# Patient Record
Sex: Male | Born: 1935 | Race: Black or African American | Hispanic: No | Marital: Married | Smoking: Former smoker
Health system: Southern US, Community
[De-identification: ages and names within clinical notes are randomized; demographics above are authoritative.]

## PROBLEM LIST (undated history)

## (undated) DIAGNOSIS — I2510 Atherosclerotic heart disease of native coronary artery without angina pectoris: Secondary | ICD-10-CM

## (undated) DIAGNOSIS — I428 Other cardiomyopathies: Secondary | ICD-10-CM

## (undated) DIAGNOSIS — Z992 Dependence on renal dialysis: Secondary | ICD-10-CM

## (undated) DIAGNOSIS — Z8673 Personal history of transient ischemic attack (TIA), and cerebral infarction without residual deficits: Secondary | ICD-10-CM

## (undated) DIAGNOSIS — D638 Anemia in other chronic diseases classified elsewhere: Secondary | ICD-10-CM

## (undated) DIAGNOSIS — N186 End stage renal disease: Secondary | ICD-10-CM

## (undated) DIAGNOSIS — M1990 Unspecified osteoarthritis, unspecified site: Secondary | ICD-10-CM

## (undated) DIAGNOSIS — J189 Pneumonia, unspecified organism: Secondary | ICD-10-CM

## (undated) DIAGNOSIS — E7800 Pure hypercholesterolemia, unspecified: Secondary | ICD-10-CM

## (undated) DIAGNOSIS — Z9289 Personal history of other medical treatment: Secondary | ICD-10-CM

## (undated) DIAGNOSIS — I10 Essential (primary) hypertension: Secondary | ICD-10-CM

## (undated) DIAGNOSIS — M109 Gout, unspecified: Secondary | ICD-10-CM

## (undated) DIAGNOSIS — I214 Non-ST elevation (NSTEMI) myocardial infarction: Secondary | ICD-10-CM

## (undated) DIAGNOSIS — I739 Peripheral vascular disease, unspecified: Secondary | ICD-10-CM

## (undated) DIAGNOSIS — Z8701 Personal history of pneumonia (recurrent): Secondary | ICD-10-CM

## (undated) HISTORY — PX: BACK SURGERY: SHX140

## (undated) HISTORY — PX: CARDIAC CATHETERIZATION: SHX172

## (undated) HISTORY — DX: Anemia in other chronic diseases classified elsewhere: D63.8

## (undated) HISTORY — PX: HEMORRHOID SURGERY: SHX153

## (undated) HISTORY — PX: COLONOSCOPY: SHX174

## (undated) HISTORY — PX: CHOLECYSTECTOMY: SHX55

## (undated) HISTORY — PX: INSERTION OF DIALYSIS CATHETER: SHX1324

---

## 2002-02-17 ENCOUNTER — Ambulatory Visit (HOSPITAL_COMMUNITY): Admission: RE | Admit: 2002-02-17 | Discharge: 2002-02-17 | Payer: Self-pay | Admitting: General Surgery

## 2002-03-18 ENCOUNTER — Encounter: Payer: Self-pay | Admitting: General Surgery

## 2002-03-18 ENCOUNTER — Ambulatory Visit (HOSPITAL_COMMUNITY): Admission: RE | Admit: 2002-03-18 | Discharge: 2002-03-18 | Payer: Self-pay | Admitting: General Surgery

## 2002-04-07 ENCOUNTER — Encounter: Payer: Self-pay | Admitting: Neurosurgery

## 2002-04-07 ENCOUNTER — Encounter: Admission: RE | Admit: 2002-04-07 | Discharge: 2002-04-07 | Payer: Self-pay | Admitting: Neurosurgery

## 2002-04-21 ENCOUNTER — Encounter: Admission: RE | Admit: 2002-04-21 | Discharge: 2002-04-21 | Payer: Self-pay | Admitting: Neurosurgery

## 2002-04-21 ENCOUNTER — Encounter: Payer: Self-pay | Admitting: Neurosurgery

## 2002-05-05 ENCOUNTER — Encounter: Payer: Self-pay | Admitting: Neurosurgery

## 2002-05-05 ENCOUNTER — Encounter: Admission: RE | Admit: 2002-05-05 | Discharge: 2002-05-05 | Payer: Self-pay | Admitting: Neurosurgery

## 2002-05-18 ENCOUNTER — Encounter: Payer: Self-pay | Admitting: Neurosurgery

## 2002-05-19 ENCOUNTER — Encounter: Payer: Self-pay | Admitting: Neurosurgery

## 2002-05-19 ENCOUNTER — Inpatient Hospital Stay (HOSPITAL_COMMUNITY): Admission: RE | Admit: 2002-05-19 | Discharge: 2002-05-22 | Payer: Self-pay | Admitting: Neurosurgery

## 2002-09-23 HISTORY — PX: LUMBAR DISC SURGERY: SHX700

## 2003-03-30 ENCOUNTER — Ambulatory Visit: Admission: RE | Admit: 2003-03-30 | Discharge: 2003-03-30 | Payer: Self-pay | Admitting: Orthopedic Surgery

## 2003-03-30 ENCOUNTER — Encounter: Payer: Self-pay | Admitting: Orthopedic Surgery

## 2003-04-01 ENCOUNTER — Ambulatory Visit (HOSPITAL_COMMUNITY): Admission: RE | Admit: 2003-04-01 | Discharge: 2003-04-01 | Payer: Self-pay | Admitting: Orthopedic Surgery

## 2003-04-01 ENCOUNTER — Encounter: Payer: Self-pay | Admitting: Orthopedic Surgery

## 2004-08-07 ENCOUNTER — Emergency Department (HOSPITAL_COMMUNITY): Admission: EM | Admit: 2004-08-07 | Discharge: 2004-08-07 | Payer: Self-pay | Admitting: Emergency Medicine

## 2004-08-13 ENCOUNTER — Ambulatory Visit: Payer: Self-pay | Admitting: Orthopedic Surgery

## 2004-09-05 ENCOUNTER — Ambulatory Visit: Payer: Self-pay | Admitting: Orthopedic Surgery

## 2005-01-10 ENCOUNTER — Ambulatory Visit (HOSPITAL_COMMUNITY): Admission: RE | Admit: 2005-01-10 | Discharge: 2005-01-10 | Payer: Self-pay | Admitting: Family Medicine

## 2005-01-31 ENCOUNTER — Ambulatory Visit (HOSPITAL_COMMUNITY): Admission: RE | Admit: 2005-01-31 | Discharge: 2005-01-31 | Payer: Self-pay | Admitting: Nephrology

## 2005-03-30 ENCOUNTER — Emergency Department (HOSPITAL_COMMUNITY): Admission: EM | Admit: 2005-03-30 | Discharge: 2005-03-30 | Payer: Self-pay | Admitting: Emergency Medicine

## 2005-06-23 ENCOUNTER — Encounter (INDEPENDENT_AMBULATORY_CARE_PROVIDER_SITE_OTHER): Payer: Self-pay | Admitting: Internal Medicine

## 2005-06-23 LAB — CONVERTED CEMR LAB: Blood Glucose, Fasting: 120 mg/dL

## 2005-08-28 ENCOUNTER — Ambulatory Visit (HOSPITAL_COMMUNITY): Admission: RE | Admit: 2005-08-28 | Discharge: 2005-08-28 | Payer: Self-pay | Admitting: Nephrology

## 2005-09-23 HISTORY — PX: KNEE ARTHROSCOPY: SHX127

## 2005-12-17 ENCOUNTER — Ambulatory Visit (HOSPITAL_COMMUNITY): Admission: RE | Admit: 2005-12-17 | Discharge: 2005-12-17 | Payer: Self-pay | Admitting: General Surgery

## 2006-02-21 ENCOUNTER — Emergency Department (HOSPITAL_COMMUNITY): Admission: EM | Admit: 2006-02-21 | Discharge: 2006-02-21 | Payer: Self-pay | Admitting: Emergency Medicine

## 2006-02-24 ENCOUNTER — Ambulatory Visit: Payer: Self-pay | Admitting: Internal Medicine

## 2006-02-26 ENCOUNTER — Ambulatory Visit (HOSPITAL_COMMUNITY): Admission: RE | Admit: 2006-02-26 | Discharge: 2006-02-26 | Payer: Self-pay | Admitting: Internal Medicine

## 2006-03-10 ENCOUNTER — Ambulatory Visit: Payer: Self-pay | Admitting: Internal Medicine

## 2006-03-17 ENCOUNTER — Ambulatory Visit: Payer: Self-pay | Admitting: Internal Medicine

## 2006-06-30 ENCOUNTER — Ambulatory Visit: Payer: Self-pay | Admitting: Internal Medicine

## 2006-06-30 LAB — CONVERTED CEMR LAB
RBC count: 4.17 10*6/uL
WBC, blood: 6.1 10*3/uL

## 2006-07-21 ENCOUNTER — Ambulatory Visit: Payer: Self-pay | Admitting: Orthopedic Surgery

## 2006-07-22 ENCOUNTER — Ambulatory Visit: Payer: Self-pay | Admitting: Orthopedic Surgery

## 2006-07-22 ENCOUNTER — Ambulatory Visit (HOSPITAL_COMMUNITY): Admission: RE | Admit: 2006-07-22 | Discharge: 2006-07-22 | Payer: Self-pay | Admitting: Orthopedic Surgery

## 2006-07-24 ENCOUNTER — Encounter (HOSPITAL_COMMUNITY): Admission: RE | Admit: 2006-07-24 | Discharge: 2006-08-23 | Payer: Self-pay | Admitting: Orthopedic Surgery

## 2006-07-28 ENCOUNTER — Ambulatory Visit: Payer: Self-pay | Admitting: Orthopedic Surgery

## 2006-08-04 ENCOUNTER — Ambulatory Visit: Payer: Self-pay | Admitting: Orthopedic Surgery

## 2006-08-18 ENCOUNTER — Encounter: Payer: Self-pay | Admitting: Internal Medicine

## 2006-08-18 ENCOUNTER — Ambulatory Visit: Payer: Self-pay | Admitting: Orthopedic Surgery

## 2006-08-18 DIAGNOSIS — E278 Other specified disorders of adrenal gland: Secondary | ICD-10-CM | POA: Insufficient documentation

## 2006-08-18 DIAGNOSIS — I119 Hypertensive heart disease without heart failure: Secondary | ICD-10-CM

## 2006-08-18 DIAGNOSIS — Q613 Polycystic kidney, unspecified: Secondary | ICD-10-CM

## 2006-08-18 DIAGNOSIS — N184 Chronic kidney disease, stage 4 (severe): Secondary | ICD-10-CM

## 2006-08-18 DIAGNOSIS — M1990 Unspecified osteoarthritis, unspecified site: Secondary | ICD-10-CM | POA: Insufficient documentation

## 2006-08-26 ENCOUNTER — Emergency Department (HOSPITAL_COMMUNITY): Admission: EM | Admit: 2006-08-26 | Discharge: 2006-08-26 | Payer: Self-pay | Admitting: Emergency Medicine

## 2006-09-08 ENCOUNTER — Ambulatory Visit: Payer: Self-pay | Admitting: Orthopedic Surgery

## 2006-10-13 ENCOUNTER — Ambulatory Visit: Payer: Self-pay | Admitting: Orthopedic Surgery

## 2006-12-10 ENCOUNTER — Ambulatory Visit: Payer: Self-pay | Admitting: Family Medicine

## 2006-12-10 DIAGNOSIS — E785 Hyperlipidemia, unspecified: Secondary | ICD-10-CM | POA: Insufficient documentation

## 2006-12-22 ENCOUNTER — Encounter (INDEPENDENT_AMBULATORY_CARE_PROVIDER_SITE_OTHER): Payer: Self-pay | Admitting: Internal Medicine

## 2006-12-22 ENCOUNTER — Encounter (INDEPENDENT_AMBULATORY_CARE_PROVIDER_SITE_OTHER): Payer: Self-pay | Admitting: Family Medicine

## 2006-12-24 ENCOUNTER — Ambulatory Visit: Payer: Self-pay | Admitting: Internal Medicine

## 2006-12-24 DIAGNOSIS — I739 Peripheral vascular disease, unspecified: Secondary | ICD-10-CM

## 2006-12-30 LAB — CONVERTED CEMR LAB
Alkaline Phosphatase: 65 units/L (ref 39–117)
BUN: 30 mg/dL — ABNORMAL HIGH (ref 6–23)
Creatinine, Ser: 1.98 mg/dL — ABNORMAL HIGH (ref 0.40–1.50)
Eosinophils Absolute: 0.2 10*3/uL (ref 0.0–0.7)
Eosinophils Relative: 3 % (ref 0–5)
Glucose, Bld: 126 mg/dL — ABNORMAL HIGH (ref 70–99)
HCT: 38.4 % — ABNORMAL LOW (ref 39.0–52.0)
HDL: 52 mg/dL (ref >39)
LDL Cholesterol: 198 mg/dL — ABNORMAL HIGH (ref 0–99)
Lymphs Abs: 1.3 10*3/uL (ref 0.7–3.3)
MCV: 89.5 fL (ref 78.0–100.0)
Monocytes Absolute: 0.7 10*3/uL (ref 0.2–0.7)
Monocytes Relative: 11 % (ref 3–11)
Platelets: 283 10*3/uL (ref 150–400)
Sodium: 142 meq/L (ref 135–145)
Total Bilirubin: 0.6 mg/dL (ref 0.3–1.2)
Total CHOL/HDL Ratio: 5.4
Triglycerides: 158 mg/dL — ABNORMAL HIGH (ref <150)
VLDL: 32 mg/dL (ref 0–40)
WBC: 5.7 10*3/uL (ref 4.0–10.5)

## 2007-01-21 ENCOUNTER — Ambulatory Visit: Payer: Self-pay | Admitting: Internal Medicine

## 2007-05-29 ENCOUNTER — Ambulatory Visit: Payer: Self-pay | Admitting: Internal Medicine

## 2007-06-24 ENCOUNTER — Ambulatory Visit: Payer: Self-pay | Admitting: Internal Medicine

## 2007-06-24 DIAGNOSIS — M79609 Pain in unspecified limb: Secondary | ICD-10-CM

## 2007-06-25 LAB — CONVERTED CEMR LAB
CO2: 25 meq/L (ref 19–32)
Calcium: 9.6 mg/dL (ref 8.4–10.5)
Cholesterol: 175 mg/dL (ref 0–200)
Creatinine, Ser: 2.04 mg/dL — ABNORMAL HIGH (ref 0.40–1.50)
Glucose, Bld: 122 mg/dL — ABNORMAL HIGH (ref 70–99)
Total Bilirubin: 0.5 mg/dL (ref 0.3–1.2)
Total CHOL/HDL Ratio: 2.9
Total Protein: 6.7 g/dL (ref 6.0–8.3)
Triglycerides: 103 mg/dL (ref <150)
VLDL: 21 mg/dL (ref 0–40)

## 2007-07-14 ENCOUNTER — Telehealth (INDEPENDENT_AMBULATORY_CARE_PROVIDER_SITE_OTHER): Payer: Self-pay | Admitting: *Deleted

## 2008-01-27 ENCOUNTER — Ambulatory Visit: Payer: Self-pay | Admitting: Internal Medicine

## 2008-01-27 DIAGNOSIS — G2581 Restless legs syndrome: Secondary | ICD-10-CM

## 2008-01-28 ENCOUNTER — Telehealth (INDEPENDENT_AMBULATORY_CARE_PROVIDER_SITE_OTHER): Payer: Self-pay | Admitting: *Deleted

## 2008-01-28 LAB — CONVERTED CEMR LAB
ALT: 9 U/L
AST: 14 U/L
Albumin: 4.2 g/dL
Alkaline Phosphatase: 64 U/L
BUN: 28 mg/dL — ABNORMAL HIGH
CO2: 25 meq/L
Calcium: 9.1 mg/dL
Chloride: 107 meq/L
Cholesterol: 260 mg/dL — ABNORMAL HIGH
Creatinine, Ser: 1.98 mg/dL — ABNORMAL HIGH
Glucose, Bld: 103 mg/dL — ABNORMAL HIGH
HDL: 50 mg/dL
LDL Cholesterol: 170 mg/dL — ABNORMAL HIGH
Potassium: 4.2 meq/L
Sodium: 142 meq/L
Total Bilirubin: 0.7 mg/dL
Total CHOL/HDL Ratio: 5.2
Total Protein: 6.6 g/dL
Triglycerides: 198 mg/dL — ABNORMAL HIGH
VLDL: 40 mg/dL

## 2008-06-03 ENCOUNTER — Ambulatory Visit: Payer: Self-pay | Admitting: Internal Medicine

## 2008-06-03 DIAGNOSIS — R21 Rash and other nonspecific skin eruption: Secondary | ICD-10-CM | POA: Insufficient documentation

## 2008-06-03 DIAGNOSIS — M545 Low back pain: Secondary | ICD-10-CM

## 2008-06-03 LAB — CONVERTED CEMR LAB
Bilirubin Urine: NEGATIVE
Ketones, urine, test strip: NEGATIVE
Nitrite: NEGATIVE
Protein, U semiquant: 100
Urobilinogen, UA: NEGATIVE
pH: 6

## 2008-06-30 ENCOUNTER — Ambulatory Visit: Payer: Self-pay | Admitting: Internal Medicine

## 2008-07-01 ENCOUNTER — Encounter (INDEPENDENT_AMBULATORY_CARE_PROVIDER_SITE_OTHER): Payer: Self-pay | Admitting: Internal Medicine

## 2008-07-04 LAB — CONVERTED CEMR LAB
Albumin: 4.3 g/dL (ref 3.5–5.2)
BUN: 39 mg/dL — ABNORMAL HIGH (ref 6–23)
CO2: 24 meq/L (ref 19–32)
Calcium: 9.1 mg/dL (ref 8.4–10.5)
Chloride: 107 meq/L (ref 96–112)
Cholesterol: 160 mg/dL (ref 0–200)
Creatinine, Ser: 1.98 mg/dL — ABNORMAL HIGH (ref 0.40–1.50)
Glucose, Bld: 125 mg/dL — ABNORMAL HIGH (ref 70–99)
HDL: 60 mg/dL (ref >39)
Potassium: 4.7 meq/L (ref 3.5–5.3)
Total CHOL/HDL Ratio: 2.7
Triglycerides: 70 mg/dL (ref <150)

## 2008-08-09 ENCOUNTER — Telehealth (INDEPENDENT_AMBULATORY_CARE_PROVIDER_SITE_OTHER): Payer: Self-pay | Admitting: Internal Medicine

## 2008-08-10 ENCOUNTER — Ambulatory Visit: Payer: Self-pay | Admitting: Internal Medicine

## 2008-08-10 DIAGNOSIS — M109 Gout, unspecified: Secondary | ICD-10-CM

## 2008-08-11 LAB — CONVERTED CEMR LAB

## 2008-08-17 ENCOUNTER — Telehealth (INDEPENDENT_AMBULATORY_CARE_PROVIDER_SITE_OTHER): Payer: Self-pay | Admitting: Internal Medicine

## 2008-10-26 ENCOUNTER — Encounter (INDEPENDENT_AMBULATORY_CARE_PROVIDER_SITE_OTHER): Payer: Self-pay | Admitting: Internal Medicine

## 2008-11-01 ENCOUNTER — Ambulatory Visit: Payer: Self-pay | Admitting: Internal Medicine

## 2008-11-02 ENCOUNTER — Encounter (INDEPENDENT_AMBULATORY_CARE_PROVIDER_SITE_OTHER): Payer: Self-pay | Admitting: Internal Medicine

## 2008-11-02 LAB — CONVERTED CEMR LAB
Alkaline Phosphatase: 63 units/L (ref 39–117)
Basophils Absolute: 0 10*3/uL (ref 0.0–0.1)
CO2: 23 meq/L (ref 19–32)
Cholesterol: 164 mg/dL (ref 0–200)
Creatinine, Ser: 1.84 mg/dL — ABNORMAL HIGH (ref 0.40–1.50)
Eosinophils Absolute: 0.2 10*3/uL (ref 0.0–0.7)
Eosinophils Relative: 3 % (ref 0–5)
Glucose, Bld: 113 mg/dL — ABNORMAL HIGH (ref 70–99)
HCT: 34.2 % — ABNORMAL LOW (ref 39.0–52.0)
Hemoglobin: 11 g/dL — ABNORMAL LOW (ref 13.0–17.0)
LDL Cholesterol: 90 mg/dL (ref 0–99)
Lymphocytes Relative: 25 % (ref 12–46)
MCV: 87.2 fL (ref 78.0–100.0)
Monocytes Absolute: 0.5 10*3/uL (ref 0.1–1.0)
Platelets: 269 10*3/uL (ref 150–400)
RDW: 15.3 % (ref 11.5–15.5)
Sodium: 142 meq/L (ref 135–145)
Total Bilirubin: 0.7 mg/dL (ref 0.3–1.2)
Total CHOL/HDL Ratio: 2.9
Triglycerides: 92 mg/dL (ref <150)
VLDL: 18 mg/dL (ref 0–40)

## 2008-11-04 LAB — CONVERTED CEMR LAB
Ferritin: 165 ng/mL (ref 22–322)
Folate: 19.1 ng/mL
Vitamin B-12: 463 pg/mL (ref 211–911)

## 2008-11-07 ENCOUNTER — Encounter (INDEPENDENT_AMBULATORY_CARE_PROVIDER_SITE_OTHER): Payer: Self-pay | Admitting: Internal Medicine

## 2009-01-11 ENCOUNTER — Telehealth (INDEPENDENT_AMBULATORY_CARE_PROVIDER_SITE_OTHER): Payer: Self-pay | Admitting: Internal Medicine

## 2009-04-23 ENCOUNTER — Emergency Department (HOSPITAL_COMMUNITY): Admission: EM | Admit: 2009-04-23 | Discharge: 2009-04-23 | Payer: Self-pay | Admitting: Emergency Medicine

## 2009-06-02 ENCOUNTER — Encounter (INDEPENDENT_AMBULATORY_CARE_PROVIDER_SITE_OTHER): Payer: Self-pay | Admitting: Internal Medicine

## 2009-06-13 ENCOUNTER — Ambulatory Visit (HOSPITAL_COMMUNITY): Admission: RE | Admit: 2009-06-13 | Discharge: 2009-06-13 | Payer: Self-pay | Admitting: Family Medicine

## 2009-07-20 ENCOUNTER — Ambulatory Visit (HOSPITAL_COMMUNITY): Admission: RE | Admit: 2009-07-20 | Discharge: 2009-07-20 | Payer: Self-pay | Admitting: Family Medicine

## 2009-07-27 ENCOUNTER — Encounter: Payer: Self-pay | Admitting: Orthopedic Surgery

## 2009-12-19 ENCOUNTER — Ambulatory Visit (HOSPITAL_COMMUNITY): Admission: RE | Admit: 2009-12-19 | Discharge: 2009-12-19 | Payer: Self-pay | Admitting: Family Medicine

## 2010-10-14 ENCOUNTER — Encounter: Payer: Self-pay | Admitting: Family Medicine

## 2010-10-21 LAB — CONVERTED CEMR LAB
Albumin: 4.2 g/dL
Basophils Absolute: 0 10*3/uL
Basophils Relative: 1 %
Basophils Relative: 1 %
CO2: 24 meq/L
Calcium: 9.5 mg/dL
Chloride: 105 meq/L
Chloride: 106 meq/L
Glucose, Bld: 126 mg/dL
HDL: 52 mg/dL
Lymphocytes Relative: 23 %
Lymphs Abs: 1.3 10*3/uL
MCHC: 31.5 g/dL
MCHC: 32.1 g/dL
Monocytes Relative: 11 %
Monocytes Relative: 8 %
Neutro Abs: 3.5 10*3/uL
Neutro Abs: 4 10*3/uL
Neutrophils Relative %: 61 %
Neutrophils Relative %: 66 %
Potassium: 3.8 meq/L
Potassium: 4 meq/L
RBC: 4.17 M/uL
RBC: 4.29 M/uL
RDW: 14.9 %
Sodium: 142 meq/L
Total CHOL/HDL Ratio: 5.4
Total Protein: 6.6 g/dL
Triglycerides: 158 mg/dL
WBC: 5.7 10*3/uL

## 2011-02-08 NOTE — H&P (Signed)
Randy [name], Randy [name] NO.:  [id number]   MEDICAL RECORD NO.:  [id number]          PATIENT TYPE:  AMB   LOCATION:  DAY                           FACILITY:  [facility]   PHYSICIAN:  Vickki Hearing, M.D.DATE OF BIRTH:  04-Aug-1932   DATE OF ADMISSION:  DATE OF DISCHARGE:  LH                                HISTORY & PHYSICAL   CHIEF COMPLAINT:  Locked right knee.   HISTORY:  This is a 77 year old male who has a history of multiple effusions  treated with aspiration injection anti-inflammatories.  He had an MRI that  showed a torn medial meniscal tear in 2004, he did not want surgery. He now  comes in with a locked knee since Thursday night early Friday morning  complaining of severe pain and swelling.   I have informed him again that he needs to have arthroscopic surgery and  that he also needs to have knee replacement in the future.   He denies any allergies.  He takes Micardis. He has had a back operation  back in 2003, otherwise he has been healthy except for hypertension.   FAMILY HISTORY:  Negative.   REVIEW OF SYSTEMS:  Consistent with heartburn, gastroesophageal reflux,  history of ulcer. Denies all other problems under review of systems  including heart disease.   PHYSICAL EXAMINATION:  GENERAL:  He is a well-developed, well-nourished  male, grooming and hygiene are normal.  Nutritional status is normal. He has  no gross deformities.  Pulses were intact.  Well perfused, good color and  temperature, no venous distension.  SKIN:  Normal x4 extremities.  NEUROPSYCHE:  Normal sensation in all extremities.  Mood and affect was  normal.  The patient was in severe distress on the office visit.  NODES:  Cervical lymph nodes were benign.  CHEST:  Clear.  HEART:  Rate and rhythm normal.  ABDOMEN:  Soft, nondistended.  GAIT:  The patient could not walk and came in a wheelchair. On exam, he has  a varus knee alignment which is chronic. He has a large joint  effusion. He  has tenderness over the medial joint line. I could get his knee to about 20  degrees and then he held it mainly at 100 degrees flexion.  Motor exam was  normal in 3 of 4 extremities. The right knee was deferred. His knee was  stable. I tried on several occasions to manipulate the knee into extension,  he did not tolerate it well.   Radiographs were repeated and his right knee showed moderate arthritis.  No  bone to bone changes.   IMPRESSION:  Medial meniscal tear, osteoarthritis large joint effusion right  knee.   PLAN:  Surgical treatment, arthroscopy right knee .   The informed consent process was completed.  He understands the risks and  benefits of the procedure and the need for further surgery-knee replacement.      Vickki Hearing, M.D.  Electronically Signed     SEH/MEDQ  D:  07/21/2006  T:  07/21/2006  Job:  [id number]

## 2011-02-08 NOTE — Discharge Summary (Signed)
   NAMEATLEY, NEUBERT NO.:  [id number]   MEDICAL RECORD NO.:  [id number]                   PATIENT TYPE:  INP   LOCATION:  3023                                 FACILITY:  [facility]   PHYSICIAN:  Hewitt Shorts, M.D.            DATE OF BIRTH:  11/28/1931   DATE OF ADMISSION:  05/19/2002  DATE OF DISCHARGE:  05/22/2002                                 DISCHARGE SUMMARY   ADMISSION HISTORY AND PHYSICAL EXAMINATION:  The patient is a 77 year old  man with a history of low back and right lower extremity pain.  MRI revealed  a large right L4-5 disk herniation.  General examination was unremarkable.  He did have weakness of his right dorsiflexor and EHL.   HOSPITAL COURSE:  The patient was admitted and underwent a right L4-5 lumbar  diskectomy by Dr. Franky Macho.  Postoperatively he has done nicely.  His wound  is healing well.  He is to have his Foley catheter removed today and  presuming he voids well and is ambulating well, he will be discharged to  home.   He has been given instructions regarding wound care and activities.  He is  to return in a week-and-a-half for suture removal with Dr. Franky Macho.  A  discharge prescription was given for Vicodin 1-2 tablets p.o. q.4-6h. p.r.n.  pain, #40 tablets prescribed, no refills.  He was also advised to use Aleve  2 tablets b.i.d.   DISCHARGE DIAGNOSIS:  Lumbar disk herniation.                                               Hewitt Shorts, M.D.    RWN/MEDQ  D:  05/22/2002  T:  05/24/2002  Job:  [id number]

## 2011-02-08 NOTE — Op Note (Signed)
NAMECHISOM, AUST NO.:  [id number]   MEDICAL RECORD NO.:  [id number]          PATIENT TYPE:  AMB   LOCATION:  DAY                           FACILITY:  [facility]   PHYSICIAN:  Vickki Hearing, M.D.DATE OF BIRTH:  1932/08/22   DATE OF PROCEDURE:  07/22/2006  DATE OF DISCHARGE:                                 OPERATIVE REPORT   HISTORY:  The patient presented with a locked knee.  History of torn medial  meniscus that he did not want surgery for.  He presented on 10/29 in intense  pain with the knee which would not extend.  It was recommended that he have  surgery, and he agreed.   PREOP DIAGNOSIS:  1. Torn medial meniscus.  2. Osteoarthritis of the left knee.   POSTOP DIAGNOSIS:  1. Torn medial meniscus.  2. Osteoarthritis of the left knee.   PROCEDURE:  1. Arthroscopy.  2. Partial medial meniscectomy.  3. Synovectomy left knee.   SURGEON:  Vickki Hearing, M.D.   ASSISTANTS:  None.   ANESTHETIC:  Spinal.   OPERATIVE FINDINGS:  There was a tear of the posterior horn medial meniscus.  There were grade 4 changes of his medial compartment.  Grade 2-3 changes of  his patellofemoral compartment, grade 1 change of his lateral compartment.  Lateral meniscus and ACL were intact.  PCL was normal.  No loose bodies were  found.   DESCRIPTION OF PROCEDURE:  The patient identified as Beckem L. Jennette Kettle.  His  right knee was marked for surgery, countersigned by the surgeon; and he was  given IV antibiotics.  His history and physical was updated as taken to the  operating room for spinal anesthetic.  After successful spinal anesthesia  his right knee was prepped and draped in a sterile technique.  A time-out  procedure was completed.  The procedure was confirmed as a right knee  arthroscopy.  Antibiotics were given within an hour of skin incision.  The  patient identified as Aman Bonet.   Lateral portal was established a diagnostic arthroscopy was  performed.  Probe was placed to the medial compartment and then taken through the knee  to probe the intra-articular structures.  We found a torn medial meniscus.  We used a 60-degree wand and a motorized shaver to debride and resect the  meniscal tear, and balanced the meniscus.  We reprobed meniscus to make sure  the rim was stable and it was.  We then washed the knee out, performed  synovectomies where needed, irrigated and closed with 3-0 nylon laterally,  Steri-Strip medially, sterile dressings, cryo cuff and brace were placed on  the knee.   POSTOP PLAN:  The patient can weight bear as tolerated.   FOLLOWUP:  Next Tuesday; and then see therapist on Thursday to start range  of motion exercise.  The patient already has Percocet for pain.  He can  continue that.      Vickki Hearing, M.D.  Electronically Signed     SEH/MEDQ  D:  07/22/2006  T:  07/22/2006  Job:  [id number]

## 2011-02-08 NOTE — H&P (Signed)
NAMEOCTAVIA, MOTTOLA                          ACCOUNT NO.:  [id number]   MEDICAL RECORD NO.:  [id number]                   PATIENT TYPE:  INP   LOCATION:  3023                                 FACILITY:  [facility]   PHYSICIAN:  Kyle L. Franky Macho, M.D.               DATE OF BIRTH:  [birth year]   DATE OF ADMISSION:  05/19/2002  DATE OF DISCHARGE:                                HISTORY & PHYSICAL   ADMITTING DIAGNOSIS:  Displaced disk, right, L4-5; right L5 radiculopathy.   INDICATIONS:  The patient is a 77 year old gentleman who presents with a  history of three weeks of pain in the right lower extremity on July 2nd.  He  did not have a history of back or leg pain prior to this.  It has become  more severe as time has worn on, pain only on the right, none on the left,  numbness when he walks going down the right leg; he also feels numbness in  his right foot.  He has had no bowel or bladder dysfunction.  He has had no  previous operations.   PAST MEDICAL HISTORY:  Past medical history is significant for hypertension.   ALLERGIES:  He has no known drug allergies.   FAMILY HISTORY:  Mother died at age 57 secondary to a stroke.  Father died  at age 11 secondary to what he describes as old age.   SOCIAL HISTORY:  He does not smoke, drink or use illicit drugs.  He is 5-  feet 9-inches tall and weighs 191 pounds.   REVIEW OF SYSTEMS:  Positive for right leg pain and back pain.  Denies  constitutional, eye, ear, nose, mouth, mouth, cardiovascular, respiratory,  gastrointestinal, genitourinary, skin, neurological, psychiatric, endocrine,  hematologic and allergic problems.   MEDICATIONS:  Medications are one he has for hypertension.   PHYSICAL EXAMINATION:  VITAL SIGNS:  He has a pulse of 64.  NEUROLOGIC:  He is alert, oriented x 4 and answers all questions  appropriately.  He has an antalgic gait and he favors the right lower  extremity.  Mild weakness in the dorsiflexors on the right foot  evidenced by  difficulty with heel walking.  He can toe walk.  Strength 5/5 on manual exam  in the left lower extremity and both upper extremities.  Right lower  extremity shows 5-/5 EHL and dorsiflexors.  He has intact proprioception and  pinprick.  He has no clonus.  Toes are downgoing to plantar stimulation.  No  Hoffmann's sign is elicited.  Reflexes 2+ at the biceps, triceps,  brachioradialis, knees and ankles.  He has normal coordination, muscle tone  and bulk.  NECK:  There are no cervical masses or bruits.  LUNGS:  Clear.  HEART:  Regular rhythm and rate.  No murmurs or rubs.  Pulses are good at  the wrists and feet bilaterally.   LABORATORY AND ACCESSORY CLINICAL  DATA:  MRI shows a large displaced disk at  L4-5, eccentric to the right side, stenosis at 3-4 from disk bulge and  concentric and spondylitic change at L5-S1.   ASSESSMENT AND PLAN:  The patient has a large displaced disk which I do not  believe will respond to conservative therapy.  He tried epidural steroids  without success and has now agreed to undergo a lumbar  laminectomy and diskectomy.  Risks of the procedure including bleeding,  infection, no pain relief, bowel or bladder dysfunction, need for further  surgery, recurrent disk herniation, spinal fluid leak were discussed.  He  understands and wishes to proceed.                                               Kyle L. Franky Macho, M.D.    Luna Kitchens  D:  05/19/2002  T:  05/21/2002  Job:  [id number]

## 2011-02-08 NOTE — Op Note (Signed)
NAMESEQUOYAH, COUNTERMAN                          ACCOUNT NO.:  [id number]   MEDICAL RECORD NO.:  [id number]                   PATIENT TYPE:  INP   LOCATION:  3023                                 FACILITY:  [facility]   PHYSICIAN:  Kyle L. Franky Macho, M.D.               DATE OF BIRTH:  [birth year]   DATE OF PROCEDURE:  05/19/2002  DATE OF DISCHARGE:  05/22/2002                                 OPERATIVE REPORT   PREOPERATIVE DIAGNOSES:  1. Displaced disk, right L4-5.  2. Right L5 radiculopathy.   POSTOPERATIVE DIAGNOSES:  1. Displaced disk, right L4-5.  2. Right L5 radiculopathy.   PROCEDURE:  Right L4 semihemilaminectomy and diskectomy with  microdissection.   COMPLICATIONS:  Unintended durotomy.   ANESTHESIA:  General endotracheal.   SURGEON:  Kyle L. Franky Macho, M.D.   ASSISTANT:  Clydene Fake, M.D.   INDICATIONS:  The patient is a 77 year old gentleman with severe right lower  extremity pain due to a large herniated disk at L4-5 on the right side.  He  was tried conservative treatment without success.  He therefore has agreed  to be taken to the operating room for decompression of the nerve root.   DESCRIPTION OF PROCEDURE:  The patient was brought to the operating room,  intubated and placed under general anesthesia without difficulty.  He was  rolled supine onto the Wilson frame and all pressure points were properly  padded.  His back was prepped, and he was draped in a sterile fashion.  I  used a preoperative localizing x-ray and then infiltrated 10 cc of 0.5%  lidocaine with 1:200,000 strength epinephrine into the subcutaneous tissue  and the paraspinous musculature on the right side.  I opened the incision  with a #10 blade and took this down to the thoracolumbar fascia sharply.  I  exposed the laminae of what I believed to be L5 and L4.  We took an x-ray  after placing a double-ended ganglion knife inferior to the lamina of L4.  It was in the correct interlaminar space.  I  then used a high-speed air  drill to perform a semihemilaminectomy of L4.  I was able to free the  ligamentum flavum from its bony attachment and exposed the thecal sac at L4-  5.  I brought the microscope into the operative field and then used  microdissection.  While dissecting with the double-ended ganglion knife,  simply pulling the dura off of the disk herniation, which was easily  identified, the dura was opened.  No nerve roots herniated.  The arachnoid  appeared to be mainly intact, but obviously there was a breach and spinal  fluid was seen in the wound.  The thecal sac was then retracted medially and  I, along with Dr. Phoebe Perch, in a progressive fashion was able to remove what  was both calcified and soft disk at L4-5.  A significant amount in  the  midline and laterally and also inferior to the disk space.  There was some  disk which was superior to the disk space.  All of this disk was removed  without difficulty.  After inspecting the nerve root and being satisfied  with the decompression, I then placed a piece of Duragen and laid that  underneath the opening, which was on the dura.  It was too low to sew, and I  felt I would be doing more harm than  good.  I then placed Tisseel to fill in the defect.  I then closed the wound  in a layered fashion using Vicryl sutures in the subcutaneous tissue.  I  used nylon running vertical mattress stitch to reapproximate the skin edges.  A sterile dressing was applied.  The patient tolerated the procedure without  difficulty.                                               Kyle L. Franky Macho, M.D.    Luna Kitchens  D:  05/19/2002  T:  05/24/2002  Job:  [id number]

## 2011-06-19 ENCOUNTER — Emergency Department (HOSPITAL_COMMUNITY)
Admission: EM | Admit: 2011-06-19 | Discharge: 2011-06-20 | Disposition: A | Discharge status: Home / Self Care | Payer: Medicare Other | Attending: Emergency Medicine | Admitting: Emergency Medicine

## 2011-06-19 DIAGNOSIS — M7989 Other specified soft tissue disorders: Secondary | ICD-10-CM | POA: Insufficient documentation

## 2011-06-19 DIAGNOSIS — M79605 Pain in left leg: Secondary | ICD-10-CM

## 2011-06-19 DIAGNOSIS — M79609 Pain in unspecified limb: Secondary | ICD-10-CM | POA: Insufficient documentation

## 2011-06-19 MED ORDER — DIAZEPAM 5 MG PO TABS
5.0000 mg | ORAL_TABLET | Freq: Once | ORAL | Status: AC
  Administered 2011-06-19: 5 mg via ORAL
  Filled 2011-06-19: qty 1

## 2011-06-19 NOTE — ED Provider Notes (Signed)
History   79yM with LLE. Pain. Occasional shooting pain and pins and needles sensation anterior L shin to ankle. Lasts seconds to sometimes minutes and then resolves. No appreciable exacerbating or relieving factors. No weakness. Denies trauma. No back back. Denies hx of diabetes. No rash. No hx of blood clot. No cp or sob. Pt reports LLE appears swollen to him.  CSN: [id number] Arrival date & time: 06/19/2011  9:56 PM  Chief Complaint  Patient presents with  . Leg Pain    (Consider location/radiation/quality/duration/timing/severity/associated sxs/prior treatment) HPI  Past Medical History  Diagnosis Date  . Hypertension     Past Surgical History  Procedure Date  . Back surgery     No family history on file.  History  Substance Use Topics  . Smoking status: Never Smoker   . Smokeless tobacco: Not on file  . Alcohol Use: No      Review of Systems  Constitutional: Negative for fever, chills and fatigue.  HENT: Negative.   Respiratory: Negative.   Cardiovascular: Positive for leg swelling. Negative for chest pain.  Gastrointestinal: Negative.   Genitourinary: Negative.   Musculoskeletal:       LLE pain  Skin: Negative for rash and wound.  Neurological: Negative for weakness and numbness.  Psychiatric/Behavioral: Negative.   All other systems reviewed and are negative.    Allergies  Review of patient's allergies indicates no known allergies.  Home Medications   Current Outpatient Rx  Name Route Sig Dispense Refill  . NAPROXEN SODIUM 220 MG PO TABS Oral Take 220 mg by mouth as needed. For pain       BP 189/94  Pulse 71  Temp(Src) 97.9 F (36.6 C) (Oral)  Resp 18  Ht 5\' 9"  (1.753 m)  Wt 194 lb (87.998 kg)  BMI 28.65 kg/m2  SpO2 98%  Physical Exam  Nursing note and vitals reviewed. Constitutional: He appears well-developed and well-nourished.  HENT:  Head: Normocephalic and atraumatic.  Cardiovascular: Normal rate, regular rhythm and normal heart  sounds.   Pulmonary/Chest: Effort normal and breath sounds normal.  Abdominal: Soft. Bowel sounds are normal.  Musculoskeletal: Normal range of motion. He exhibits no tenderness.       Trace pitting le edema. Symmetric. No calf tenderness. Neg homans.  Neurological: He is alert. He exhibits normal muscle tone.       Strength 5/5 b/l LE with hip flex/ext, knee flex/ext and plantar/dorsi flexion. Sensation intact to light touch.  Skin: Skin is warm and dry. No rash noted.  Psychiatric: He has a normal mood and affect.    ED Course  Procedures (including critical care time)  Labs Reviewed - No data to display No results found.   No diagnosis found.    MDM  79yM with LLE pain. Suspect neuropathy given pt's description of symptoms. Trace b/l LE edema and symmetric but given pt's report of worse edema on LLE will arrange outpt [place]. Clinical suspicion not high enough to anticoagulate empirically at this time.       Raeford Razor, MD 06/20/11 [phone]

## 2011-06-19 NOTE — ED Notes (Signed)
Pt reports pain to his left lower leg and ankle for the past 2 weeks.  Pt denies any injury, heat or swelling to the area.

## 2011-06-19 NOTE — ED Notes (Signed)
Pt states no relief from medication at this time.

## 2011-06-19 NOTE — ED Notes (Signed)
Pt complains of left leg pain for the past few weeks, has gotten worse over the past few days, denies any injury. Pulses & sensation present. No signs of deformity or swelling noted.

## 2011-06-19 NOTE — ED Notes (Signed)
Family at bedside. Patient and family want to know if patient is having an X-ray done tonight or in the morning. Family states that one of the employees informed her that patient would not be getting the X-ray till tomorrow. RN Ron notified.

## 2011-06-20 ENCOUNTER — Ambulatory Visit (HOSPITAL_COMMUNITY)
Admit: 2011-06-20 | Discharge: 2011-06-20 | Disposition: A | Discharge status: Home / Self Care | Payer: Medicare Other | Source: Ambulatory Visit | Attending: Emergency Medicine | Admitting: Emergency Medicine

## 2011-06-20 DIAGNOSIS — M79609 Pain in unspecified limb: Secondary | ICD-10-CM | POA: Insufficient documentation

## 2011-06-20 DIAGNOSIS — M7989 Other specified soft tissue disorders: Secondary | ICD-10-CM | POA: Insufficient documentation

## 2011-06-20 NOTE — ED Notes (Signed)
Reviewed f/u b/l LE dopplers. Negative for DVT. D/W Patient. Advised pt to f/u with his PCP for his LE swelling  Forbes Cellar, MD 06/20/11 [phone]

## 2011-07-12 ENCOUNTER — Encounter (HOSPITAL_COMMUNITY): Admission: RE | Admit: 2011-07-12 | Payer: Medicare Other | Source: Ambulatory Visit

## 2011-07-16 ENCOUNTER — Ambulatory Visit (HOSPITAL_COMMUNITY): Admission: RE | Admit: 2011-07-16 | Payer: Medicare Other | Source: Ambulatory Visit | Admitting: General Surgery

## 2011-07-16 ENCOUNTER — Encounter (HOSPITAL_COMMUNITY): Admission: RE | Payer: Self-pay | Source: Ambulatory Visit

## 2011-07-16 SURGERY — EXCISION, GANGLION CYST, WRIST
Anesthesia: General | Site: Wrist | Laterality: Right

## 2011-10-21 ENCOUNTER — Emergency Department (HOSPITAL_COMMUNITY)
Admission: EM | Admit: 2011-10-21 | Discharge: 2011-10-21 | Disposition: A | Discharge status: Home / Self Care | Payer: Medicare Other | Attending: Emergency Medicine | Admitting: Emergency Medicine

## 2011-10-21 ENCOUNTER — Encounter (HOSPITAL_COMMUNITY): Payer: Self-pay | Admitting: *Deleted

## 2011-10-21 ENCOUNTER — Emergency Department (HOSPITAL_COMMUNITY): Payer: Medicare Other

## 2011-10-21 DIAGNOSIS — M7989 Other specified soft tissue disorders: Secondary | ICD-10-CM | POA: Insufficient documentation

## 2011-10-21 DIAGNOSIS — I10 Essential (primary) hypertension: Secondary | ICD-10-CM | POA: Insufficient documentation

## 2011-10-21 DIAGNOSIS — R609 Edema, unspecified: Secondary | ICD-10-CM | POA: Insufficient documentation

## 2011-10-21 DIAGNOSIS — M25469 Effusion, unspecified knee: Secondary | ICD-10-CM | POA: Insufficient documentation

## 2011-10-21 DIAGNOSIS — M25569 Pain in unspecified knee: Secondary | ICD-10-CM | POA: Insufficient documentation

## 2011-10-21 DIAGNOSIS — G90521 Complex regional pain syndrome I of right lower limb: Secondary | ICD-10-CM

## 2011-10-21 DIAGNOSIS — N289 Disorder of kidney and ureter, unspecified: Secondary | ICD-10-CM

## 2011-10-21 LAB — CBC
HCT: 33.3 % — ABNORMAL LOW (ref 39.0–52.0)
Hemoglobin: 10.9 g/dL — ABNORMAL LOW (ref 13.0–17.0)
MCV: 88.8 fL (ref 78.0–100.0)
Platelets: 273 10*3/uL (ref 150–400)
RBC: 3.75 MIL/uL — ABNORMAL LOW (ref 4.22–5.81)
WBC: 8.5 10*3/uL (ref 4.0–10.5)

## 2011-10-21 LAB — BASIC METABOLIC PANEL
CO2: 26 mEq/L (ref 19–32)
Calcium: 9.8 mg/dL (ref 8.4–10.5)
Glucose, Bld: 107 mg/dL — ABNORMAL HIGH (ref 70–99)
Sodium: 142 mEq/L (ref 135–145)

## 2011-10-21 LAB — APTT: aPTT: 72 seconds — ABNORMAL HIGH (ref 24–37)

## 2011-10-21 LAB — DIFFERENTIAL
Eosinophils Relative: 2 % (ref 0–5)
Lymphocytes Relative: 14 % (ref 12–46)
Lymphs Abs: 1.2 10*3/uL (ref 0.7–4.0)
Monocytes Absolute: 0.8 10*3/uL (ref 0.1–1.0)
Monocytes Relative: 10 % (ref 3–12)

## 2011-10-21 LAB — PROTIME-INR: INR: 0.94 (ref 0.00–1.49)

## 2011-10-21 MED ORDER — HYDROCODONE-ACETAMINOPHEN 5-325 MG PO TABS: 1.0000 | ORAL_TABLET | ORAL | Status: AC | PRN

## 2011-10-21 MED ORDER — PREDNISONE 50 MG PO TABS: 50.0000 mg | ORAL_TABLET | Freq: Every day | ORAL | Status: AC

## 2011-10-21 MED ORDER — HYDROMORPHONE HCL PF 1 MG/ML IJ SOLN
1.0000 mg | Freq: Once | INTRAMUSCULAR | Status: AC
  Administered 2011-10-21: 1 mg via INTRAMUSCULAR
  Filled 2011-10-21: qty 1

## 2011-10-21 MED ORDER — KETOROLAC TROMETHAMINE 60 MG/2ML IM SOLN
60.0000 mg | Freq: Once | INTRAMUSCULAR | Status: AC
  Administered 2011-10-21: 60 mg via INTRAMUSCULAR
  Filled 2011-10-21: qty 2

## 2011-10-21 NOTE — ED Provider Notes (Signed)
History   This chart was scribed for Donnetta Hutching, MD by Sofie Rower. The patient was seen in room [facility] and the patient's care was started at 3:21PM.    CSN: [id number]  Arrival date & time 10/21/11  1400   First MD Initiated Contact with Patient 10/21/11 1506      Chief Complaint  Patient presents with  . Knee Pain    (Consider location/radiation/quality/duration/timing/severity/associated sxs/prior treatment) HPI  Randy [name] is a 78 y.o. male who presents to the Emergency Department complaining of moderate, constant knee pain onset 7 days ago with associated symptoms swelling, and pain radiation down the right leg. Pt states the pain is a sharp pain. Pt states that Puffiness in foot is more pronounced than normal. Pt was seen for similar symptoms by Dr. Tiburcio Pea four to five years ago.  PCP is Dr. Janna Arch.  Past Medical History  Diagnosis Date  . Hypertension     Past Surgical History  Procedure Date  . Back surgery   . Knee surgery     History reviewed. No pertinent family history.  History  Substance Use Topics  . Smoking status: Never Smoker   . Smokeless tobacco: Not on file  . Alcohol Use: No      Review of Systems\  10 Systems reviewed and are negative for acute change except as noted in the HPI.   Allergies  Review of patient's allergies indicates no known allergies.  Home Medications   Current Outpatient Rx  Name Route Sig Dispense Refill  . ALLOPURINOL 300 MG PO TABS Oral Take 300 mg by mouth daily.      Marland Kitchen AMLODIPINE BESYLATE 10 MG PO TABS Oral Take 10 mg by mouth daily.      Marland Kitchen LABETALOL HCL 200 MG PO TABS Oral Take 200 mg by mouth 2 (two) times daily.      Marland Kitchen LISINOPRIL-HYDROCHLOROTHIAZIDE 20-12.5 MG PO TABS Oral Take 1 tablet by mouth daily.      Marland Kitchen NAPROXEN SODIUM 220 MG PO TABS Oral Take 220 mg by mouth as needed. For pain     . SIMVASTATIN 80 MG PO TABS Oral Take 80 mg by mouth at bedtime.      . TOBRAMYCIN-DEXAMETHASONE 0.3-0.1 % OP  OINT Both Eyes Place 1 application into both eyes 2 (two) times daily.        BP 157/78  Pulse 77  Temp(Src) 98 F (36.7 C) (Oral)  Resp 20  Ht 5\' 9"  (1.753 m)  Wt 194 lb (87.998 kg)  BMI 28.65 kg/m2  SpO2 100%  Physical Exam  Nursing note and vitals reviewed. Constitutional: He is oriented to person, place, and time. He appears well-developed and well-nourished.  HENT:  Head: Normocephalic and atraumatic.  Nose: Nose normal.  Eyes: Conjunctivae and EOM are normal. Right eye exhibits no discharge. Left eye exhibits no discharge.  Neck: Normal range of motion. Neck supple.  Musculoskeletal: Normal range of motion. He exhibits edema.       Swelling in right anterior knee and right anterior foot. Right knee edemitis.   Neurological: He is alert and oriented to person, place, and time.  Skin: Skin is warm and dry. No rash noted.  Psychiatric: He has a normal mood and affect. His behavior is normal.    ED Course  Procedures (including critical care time)  DIAGNOSTIC STUDIES: Oxygen Saturation is 100% on room air, normal by my interpretation.    COORDINATION OF CARE:     Results for  orders placed during the hospital encounter of 10/21/11  CBC      Component Value Range   WBC 8.5  4.0 - 10.5 (K/uL)   RBC 3.75 (*) 4.22 - 5.81 (MIL/uL)   Hemoglobin 10.9 (*) 13.0 - 17.0 (g/dL)   HCT 16.1 (*) 09.6 - 52.0 (%)   MCV 88.8  78.0 - 100.0 (fL)   MCH 29.1  26.0 - 34.0 (pg)   MCHC 32.7  30.0 - 36.0 (g/dL)   RDW 04.5 (*) 40.9 - 15.5 (%)   Platelets 273  150 - 400 (K/uL)  DIFFERENTIAL      Component Value Range   Neutrophils Relative 74  43 - 77 (%)   Neutro Abs 6.3  1.7 - 7.7 (K/uL)   Lymphocytes Relative 14  12 - 46 (%)   Lymphs Abs 1.2  0.7 - 4.0 (K/uL)   Monocytes Relative 10  3 - 12 (%)   Monocytes Absolute 0.8  0.1 - 1.0 (K/uL)   Eosinophils Relative 2  0 - 5 (%)   Eosinophils Absolute 0.2  0.0 - 0.7 (K/uL)   Basophils Relative 0  0 - 1 (%)   Basophils Absolute 0.0  0.0  - 0.1 (K/uL)  BASIC METABOLIC PANEL      Component Value Range   Sodium 142  135 - 145 (mEq/L)   Potassium 4.3  3.5 - 5.1 (mEq/L)   Chloride 105  96 - 112 (mEq/L)   CO2 26  19 - 32 (mEq/L)   Glucose, Bld 107 (*) 70 - 99 (mg/dL)   BUN 31 (*) 6 - 23 (mg/dL)   Creatinine, Ser 8.11 (*) 0.50 - 1.35 (mg/dL)   Calcium 9.8  8.4 - 91.4 (mg/dL)   GFR calc non Af Amer 21 (*) >90 (mL/min)   GFR calc Af Amer 24 (*) >90 (mL/min)  PROTIME-INR      Component Value Range   Prothrombin Time 12.8  11.6 - 15.2 (seconds)   INR 0.94  0.00 - 1.49   APTT      Component Value Range   aPTT 72 (*) 24 - 37 (seconds)   US Venous Img Lower Unilateral Right  10/21/2011  *RADIOLOGY REPORT*  Clinical Data: Right leg pain and swelling.  Previous tobacco use.  RIGHT LOWER EXTREMITY VENOUS DOPPLER ULTRASOUND  Technique: Gray-scale sonography with compression, as well as color and duplex ultrasound, were performed to evaluate the deep venous system from the level of the common femoral vein through the popliteal and proximal calf veins.  Comparison: None  Findings:  Normal compressibility of  the common femoral, superficial femoral, and popliteal veins, as well as the proximal calf veins.  No filling defects to suggest DVT on grayscale or color Doppler imaging.  Doppler waveforms show normal direction of venous flow, normal respiratory phasicity and response to augmentation. There is subcutaneous edema at the ankle level.  IMPRESSION: No evidence of  lower extremity deep vein thrombosis.  Original Report Authenticated By: Osa Craver, M.D.             3:22PM- EDP at bedside discusses treatment plan concerning doppler to rule out blood clot, pain medication and blood work.   MDM  Doppler was negative for DVT in right lower extremity. Suspect flare up of gout. We'll prescribe prednisone and pain medicine. Follow up with orthopedic surgeon. Patient is already on nonsteroidal      I personally performed  the services described in this documentation, which was scribed in  my presence. The recorded information has been reviewed and considered.     Donnetta Hutching, MD 10/21/11 1754

## 2011-10-21 NOTE — ED Notes (Signed)
Swelling of lower leg

## 2011-10-21 NOTE — ED Notes (Signed)
Pain rt knee for 1 week.No injury.

## 2011-11-12 ENCOUNTER — Ambulatory Visit (INDEPENDENT_AMBULATORY_CARE_PROVIDER_SITE_OTHER): Payer: Medicare Other | Admitting: Orthopedic Surgery

## 2011-11-12 ENCOUNTER — Encounter: Payer: Self-pay | Admitting: Orthopedic Surgery

## 2011-11-12 VITALS — BP 140/70 | Ht 69.0 in | Wt 194.0 lb

## 2011-11-12 DIAGNOSIS — M1710 Unilateral primary osteoarthritis, unspecified knee: Secondary | ICD-10-CM

## 2011-11-12 NOTE — Patient Instructions (Signed)
The patient was given instructions to followup with [place] as needed.

## 2011-11-12 NOTE — Progress Notes (Signed)
  Subjective:    Randy [name] is a 78 y.o. male who presents with the RIGHT knee problem.  The patient had history of osteoarthritis RIGHT knee was doing well.  On January 17 of this year he had acute swelling of the knee and the leg.  He went to the hospital emergency room.  An ultrasound showed no DVT his symptoms gradually resolved  He is not having any pain at this time.  He has some bilateral ankle swelling which is chronic and he has hypertension who's being treated for that.   The following portions of the patient's history were reviewed and updated as appropriate: allergies, current medications, past family history, past medical history, past social history, past surgical history and problem list.   Review of Systems A comprehensive review of systems was negative except for: does complain of numbness and tingling with unsteady gait excessive thirst and excessive urination.   Objective:    BP 140/70  Ht 5\' 9"  (1.753 m)  Wt 194 lb (87.998 kg)  BMI 28.65 kg/m2 Physical Exam(12) GENERAL: normal development   CDV: pulses are normal   Skin: normal  Lymph: nodes were not palpable/normal  Psychiatric: awake, alert and oriented  Neuro: normal sensation   Right knee: normal and no effusion, full active range of motion, no joint line tenderness, ligamentous structures intact.  Left knee:  normal and no effusion, full active range of motion, no joint line tenderness, ligamentous structures intact.   X-ray right knee: shows DJD changes, likely chronic    Assessment:    Right osteoarthritis with acute swelling now resolved    Plan:    Natural history and expected course discussed. Questions answered. he will follow up as needed

## 2011-11-12 NOTE — Progress Notes (Signed)
X-ray report  Date of x-ray November 12, 2011  RIGHT knee pain and swelling  The patient has mild varus malalignment.  He has severe patellofemoral compartment disease.  There no spurs or cyst formation.  He does have popliteal artery calcification.  Impression osteoarthritis RIGHT knee with patellofemoral compartment predominant findings.

## 2011-12-30 NOTE — Consult Note (Signed)
NAMECAIDAN, Randy [name] NO.:  [id number]  MEDICAL RECORD NO.:  [id number]  LOCATION:  [facility]                         FACILITY:  [facility]  PHYSICIAN:  Barbaraann Barthel, M.D. DATE OF BIRTH:  09-25-1931  DATE OF CONSULTATION:  12/30/2011 DATE OF DISCHARGE:                                CONSULTATION   DIAGNOSIS:  Ganglion cyst, dorsal aspect of right wrist.  NOTE:  This is a 78 year old black male who has had increasing discomfort in his right wrist with an obvious ganglion cyst here.  I discussed the need for removing this via the outpatient department to the surgical suite.  Complications were discussed not limited to, but including bleeding, infection, and recurrence.  Informed consent was obtained.  PHYSICAL EXAMINATION:  VITAL SIGNS:  He is 5 feet 9 inches, weighs 194 pounds.  Blood pressure is 124/70, heart rate 60, respirations 12, temperature 97.9. HEENT:  Head is normocephalic.  Eyes, extraocular movements are intact. Pupils were round, reactive to light and accommodation.  There is no conjunctival pallor or scleral injection.  The sclerae have a normal tincture.  Nose and oral mucosa are moist.  The patient has dental prosthesis.  No adenopathy is appreciated.  No bruits are auscultated, and there is no thyromegaly. CHEST:  Fairly clear.  He has some scattered rhonchi, but no shortness of breath. HEART:  Regular rhythm. ABDOMEN:  Soft, without masses or hernias. EXTREMITIES:  No pedal edema.  The patient has had a previous right knee laparoscopy. RECTAL:  Deferred.  REVIEW OF SYSTEMS:  NEURO:  No history of migraines or seizures. ENDOCRINE:  No history of diabetes or thyroid disease.  CARDIOPULMONARY: The patient has a history of hypertension and hypercholesterolemia. MUSCULOSKELETAL:  The patient has had right knee pain.  GI:  Had a colonoscopy done approximately 10 years ago.  This was reported by him to be normal.  He has had no recent episodes of  bright red rectal bleeding, black tarry stools, inflammatory bowel disease, history of unexplained weight loss, or any constipation or diarrhea, and as stated, he has had no history of hepatitis.  GU:  No history of kidney stones, dysuria, or frequency.  Diagnosis therefore, ganglion cyst, right wrist.  We will plan for surgery via the outpatient department.  I discussed this in detail with him and informed consent was obtained.     Barbaraann Barthel, M.D.     WB/MEDQ  D:  12/30/2011  T:  12/30/2011  Job:  [id number]  cc:   Melvyn Novas, MD Fax: [phone]

## 2011-12-31 ENCOUNTER — Encounter (HOSPITAL_COMMUNITY): Payer: Self-pay | Admitting: Pharmacy Technician

## 2011-12-31 NOTE — Patient Instructions (Addendum)
22 JUN OSMENT  12/31/2011   Your procedure is scheduled on:  Friday, April 12  Report to [facility] at [place] AM.  Call this number if you have problems the morning of surgery: [phone]   Remember:   Do not eat food:After Midnight.  May have clear liquids:until Midnight .  Clear liquids include soda, tea, black coffee, apple or grape juice, broth.  Take these medicines the morning of surgery with A SIP OF WATER: Labetalol, Clonidine, Amlodipine   Do not wear jewelry, make-up or nail polish.  Do not wear lotions, powders, or perfumes. You may wear deodorant.  Do not shave 48 hours prior to surgery.  Do not bring valuables to the hospital.  Contacts, dentures or bridgework may not be worn into surgery.  Leave suitcase in the car. After surgery it may be brought to your room.  For patients admitted to the hospital, checkout time is 11:00 AM the day of discharge.   Patients discharged the day of surgery will not be allowed to drive home.  Name and phone number of your driver:Family  Special Instructions: CHG Shower Use Special Wash: 1/2 bottle night before surgery and 1/2 bottle morning of surgery.   Please read over the following fact sheets that you were given: Pain Booklet, Coughing and Deep Breathing, Surgical Site Infection Prevention, Anesthesia Post-op Instructions and Care and Recovery After Surgery   Ganglion A ganglion is a swelling under the skin that is filled with a thick, jelly-like substance. It is a synovial cyst. This is caused by a break (rupture) of the joint lining from the joint space. A ganglion often occurs near an area of repeated minor trauma (damage caused by an accident). Trauma may also be a repetitive movement at work or in a sport. TREATMENT  It often goes away without treatment. It may reappear later. Sometimes a ganglion may need to be surgically removed. Often they are drained and injected with a steroid. Sometimes they respond to:  Rest.   Splinting.    HOME CARE INSTRUCTIONS   Your caregiver will decide the best way of treating your ganglion. Do not try to break the ganglion yourself by pressing on it, poking it with a needle, or hitting it with a heavy object.   Use medications as directed.  SEEK MEDICAL CARE IF:   The ganglion becomes larger or more painful.   You have increased redness or swelling.   You have weakness or numbness in your hand or wrist.  MAKE SURE YOU:   Understand these instructions.   Will watch your condition.   Will get help right away if you are not doing well or get worse.  Document Released: 09/06/2000 Document Revised: 08/29/2011 Document Reviewed: 11/03/2007 [facility] Patient Information 2012 [place], [place].   PATIENT INSTRUCTIONS POST-ANESTHESIA  IMMEDIATELY FOLLOWING SURGERY:  Do not drive or operate machinery for the first twenty four hours after surgery.  Do not make any important decisions for twenty four hours after surgery or while taking narcotic pain medications or sedatives.  If you develop intractable nausea and vomiting or a severe headache please notify your doctor immediately.  FOLLOW-UP:  Please make an appointment with your surgeon as instructed. You do not need to follow up with anesthesia unless specifically instructed to do so.  WOUND CARE INSTRUCTIONS (if applicable):  Keep a dry clean dressing on the anesthesia/puncture wound site if there is drainage.  Once the wound has quit draining you may leave it open to air.  Generally you should leave the bandage intact for twenty four hours unless there is drainage.  If the epidural site drains for more than 36-48 hours please call the anesthesia department.  QUESTIONS?:  Please feel free to call your physician or the hospital operator if you have any questions, and they will be happy to assist you.

## 2011-12-31 NOTE — Consult Note (Signed)
NAMEJOHNEL, YIELDING NO.:  [id number]  MEDICAL RECORD NO.:  [id number]  LOCATION:                                 FACILITY:  PHYSICIAN:  Barbaraann Barthel, M.D. DATE OF BIRTH:  Aug 30, 1932  DATE OF CONSULTATION:  12/30/2011 DATE OF DISCHARGE:                                CONSULTATION   DIAGNOSIS:  Ganglion cyst, dorsal aspect of right wrist, this has been present for several months.  This has caused him considerable discomfort and he comes for excisional biopsy of this.  We discussed the need for this to be done in the operating room via the outpatient department.  We discussed complications not limited to, but including bleeding and infection.     Barbaraann Barthel, M.D.     WB/MEDQ  D:  12/30/2011  T:  12/30/2011  Job:  [id number]

## 2012-01-01 ENCOUNTER — Encounter (HOSPITAL_COMMUNITY): Payer: Self-pay

## 2012-01-01 ENCOUNTER — Encounter (HOSPITAL_COMMUNITY)
Admission: RE | Admit: 2012-01-01 | Discharge: 2012-01-01 | Disposition: A | Discharge status: Home / Self Care | Payer: Medicare Other | Source: Ambulatory Visit | Attending: General Surgery | Admitting: General Surgery

## 2012-01-01 HISTORY — DX: Pure hypercholesterolemia, unspecified: E78.00

## 2012-01-01 LAB — DIFFERENTIAL
Basophils Absolute: 0 10*3/uL (ref 0.0–0.1)
Lymphocytes Relative: 23 % (ref 12–46)
Neutro Abs: 3.9 10*3/uL (ref 1.7–7.7)

## 2012-01-01 LAB — BASIC METABOLIC PANEL
CO2: 26 mEq/L (ref 19–32)
Chloride: 106 mEq/L (ref 96–112)
Potassium: 4.2 mEq/L (ref 3.5–5.1)
Sodium: 140 mEq/L (ref 135–145)

## 2012-01-01 LAB — CBC
HCT: 30.6 % — ABNORMAL LOW (ref 39.0–52.0)
Platelets: 246 10*3/uL (ref 150–400)
RDW: 15.3 % (ref 11.5–15.5)
WBC: 5.9 10*3/uL (ref 4.0–10.5)

## 2012-01-01 LAB — SURGICAL PCR SCREEN: Staphylococcus aureus: NEGATIVE

## 2012-01-01 NOTE — Pre-Procedure Instructions (Signed)
Abnormal labs faxed to Dr Daisy Blossom office.

## 2012-01-03 ENCOUNTER — Encounter (HOSPITAL_COMMUNITY): Payer: Self-pay | Admitting: *Deleted

## 2012-01-03 ENCOUNTER — Ambulatory Visit (HOSPITAL_COMMUNITY)
Admission: RE | Admit: 2012-01-03 | Discharge: 2012-01-03 | Disposition: A | Discharge status: Home / Self Care | Payer: Medicare Other | Source: Ambulatory Visit | Attending: General Surgery | Admitting: General Surgery

## 2012-01-03 ENCOUNTER — Encounter (HOSPITAL_COMMUNITY): Payer: Self-pay | Admitting: Anesthesiology

## 2012-01-03 ENCOUNTER — Ambulatory Visit (HOSPITAL_COMMUNITY): Payer: Medicare Other | Admitting: Anesthesiology

## 2012-01-03 ENCOUNTER — Encounter (HOSPITAL_COMMUNITY)
Admission: RE | Disposition: A | Discharge status: Home / Self Care | Payer: Self-pay | Source: Ambulatory Visit | Attending: General Surgery

## 2012-01-03 DIAGNOSIS — I10 Essential (primary) hypertension: Secondary | ICD-10-CM | POA: Insufficient documentation

## 2012-01-03 DIAGNOSIS — M1990 Unspecified osteoarthritis, unspecified site: Secondary | ICD-10-CM

## 2012-01-03 DIAGNOSIS — Z01812 Encounter for preprocedural laboratory examination: Secondary | ICD-10-CM | POA: Insufficient documentation

## 2012-01-03 DIAGNOSIS — G2581 Restless legs syndrome: Secondary | ICD-10-CM

## 2012-01-03 DIAGNOSIS — M79609 Pain in unspecified limb: Secondary | ICD-10-CM

## 2012-01-03 DIAGNOSIS — I739 Peripheral vascular disease, unspecified: Secondary | ICD-10-CM

## 2012-01-03 DIAGNOSIS — M6740 Ganglion, unspecified site: Secondary | ICD-10-CM | POA: Insufficient documentation

## 2012-01-03 DIAGNOSIS — M1710 Unilateral primary osteoarthritis, unspecified knee: Secondary | ICD-10-CM

## 2012-01-03 DIAGNOSIS — M109 Gout, unspecified: Secondary | ICD-10-CM

## 2012-01-03 DIAGNOSIS — E785 Hyperlipidemia, unspecified: Secondary | ICD-10-CM

## 2012-01-03 DIAGNOSIS — Q613 Polycystic kidney, unspecified: Secondary | ICD-10-CM

## 2012-01-03 DIAGNOSIS — E278 Other specified disorders of adrenal gland: Secondary | ICD-10-CM

## 2012-01-03 DIAGNOSIS — M545 Low back pain: Secondary | ICD-10-CM

## 2012-01-03 DIAGNOSIS — Z01810 Encounter for preprocedural cardiovascular examination: Secondary | ICD-10-CM | POA: Insufficient documentation

## 2012-01-03 DIAGNOSIS — Z79899 Other long term (current) drug therapy: Secondary | ICD-10-CM | POA: Insufficient documentation

## 2012-01-03 DIAGNOSIS — R21 Rash and other nonspecific skin eruption: Secondary | ICD-10-CM

## 2012-01-03 HISTORY — PX: GANGLION CYST EXCISION: SHX1691

## 2012-01-03 SURGERY — EXCISION, GANGLION CYST, WRIST
Anesthesia: Regional | Site: Wrist | Laterality: Right | Wound class: Clean

## 2012-01-03 MED ORDER — ACETAMINOPHEN 325 MG PO TABS: 325.0000 mg | ORAL_TABLET | ORAL | Status: DC | PRN

## 2012-01-03 MED ORDER — BACITRACIN-NEOMYCIN-POLYMYXIN 400-5-5000 EX OINT
TOPICAL_OINTMENT | CUTANEOUS | Status: DC | PRN
  Administered 2012-01-03: 1 via TOPICAL

## 2012-01-03 MED ORDER — GLYCOPYRROLATE 0.2 MG/ML IJ SOLN
0.2000 mg | Freq: Once | INTRAMUSCULAR | Status: AC
  Administered 2012-01-03: 0.2 mg via INTRAVENOUS

## 2012-01-03 MED ORDER — LACTATED RINGERS IV SOLN
INTRAVENOUS | Status: DC
  Administered 2012-01-03: 09:00:00 via INTRAVENOUS

## 2012-01-03 MED ORDER — PROPOFOL 10 MG/ML IV EMUL
INTRAVENOUS | Status: AC
  Filled 2012-01-03: qty 20

## 2012-01-03 MED ORDER — SODIUM CHLORIDE 0.9 % IR SOLN
Status: DC | PRN
  Administered 2012-01-03: 1000 mL

## 2012-01-03 MED ORDER — FENTANYL CITRATE 0.05 MG/ML IJ SOLN
INTRAMUSCULAR | Status: AC
  Filled 2012-01-03: qty 5

## 2012-01-03 MED ORDER — BUPIVACAINE HCL (PF) 0.5 % IJ SOLN
INTRAMUSCULAR | Status: AC
  Filled 2012-01-03: qty 30

## 2012-01-03 MED ORDER — LIDOCAINE HCL (PF) 0.5 % IJ SOLN
INTRAMUSCULAR | Status: AC
  Filled 2012-01-03: qty 50

## 2012-01-03 MED ORDER — MIDAZOLAM HCL 2 MG/2ML IJ SOLN
1.0000 mg | INTRAMUSCULAR | Status: DC | PRN
  Administered 2012-01-03: 2 mg via INTRAVENOUS

## 2012-01-03 MED ORDER — MIDAZOLAM HCL 2 MG/2ML IJ SOLN
INTRAMUSCULAR | Status: AC
  Administered 2012-01-03: 2 mg via INTRAVENOUS
  Filled 2012-01-03: qty 2

## 2012-01-03 MED ORDER — FENTANYL CITRATE 0.05 MG/ML IJ SOLN
INTRAMUSCULAR | Status: DC | PRN
  Administered 2012-01-03: 50 ug via INTRAVENOUS

## 2012-01-03 MED ORDER — SODIUM CHLORIDE 0.9 % IJ SOLN
INTRAMUSCULAR | Status: AC
  Filled 2012-01-03: qty 10

## 2012-01-03 MED ORDER — BACITRACIN ZINC 500 UNIT/GM EX OINT
TOPICAL_OINTMENT | CUTANEOUS | Status: AC
  Filled 2012-01-03: qty 0.9

## 2012-01-03 MED ORDER — PROPOFOL 10 MG/ML IV EMUL
INTRAVENOUS | Status: DC | PRN
  Administered 2012-01-03: 50 ug/kg/min via INTRAVENOUS

## 2012-01-03 MED ORDER — LACTATED RINGERS IV SOLN
INTRAVENOUS | Status: DC | PRN
  Administered 2012-01-03: 09:00:00 via INTRAVENOUS

## 2012-01-03 MED ORDER — STERILE WATER FOR IRRIGATION IR SOLN
Status: DC | PRN
  Administered 2012-01-03: 1000 mL

## 2012-01-03 MED ORDER — CEFAZOLIN SODIUM 1-5 GM-% IV SOLN
INTRAVENOUS | Status: AC
  Administered 2012-01-03: 1 g via INTRAVENOUS
  Filled 2012-01-03: qty 50

## 2012-01-03 MED ORDER — LIDOCAINE HCL (PF) 0.5 % IJ SOLN
INTRAMUSCULAR | Status: DC | PRN
  Administered 2012-01-03: 50 mL via INTRATHECAL

## 2012-01-03 MED ORDER — CEFAZOLIN SODIUM 1-5 GM-% IV SOLN: 1.0000 g | INTRAVENOUS | Status: DC

## 2012-01-03 MED ORDER — ONDANSETRON HCL 4 MG/2ML IJ SOLN: 4.0000 mg | Freq: Once | INTRAMUSCULAR | Status: DC | PRN

## 2012-01-03 MED ORDER — FENTANYL CITRATE 0.05 MG/ML IJ SOLN: 25.0000 ug | INTRAMUSCULAR | Status: DC | PRN

## 2012-01-03 MED ORDER — GLYCOPYRROLATE 0.2 MG/ML IJ SOLN
INTRAMUSCULAR | Status: AC
  Administered 2012-01-03: 0.2 mg via INTRAVENOUS
  Filled 2012-01-03: qty 1

## 2012-01-03 SURGICAL SUPPLY — 46 items
0.5% MARCAINE (PF) IMPLANT
BAG HAMPER (MISCELLANEOUS) ×2 IMPLANT
BANDAGE CONFORM 2  STR LF (GAUZE/BANDAGES/DRESSINGS) ×2 IMPLANT
BANDAGE CONFORM 3  STR LF (GAUZE/BANDAGES/DRESSINGS) ×2 IMPLANT
BANDAGE ESMARK 4X12 BL STRL LF (DISPOSABLE) ×1 IMPLANT
BLADE SURG 15 STRL LF DISP TIS (BLADE) ×1 IMPLANT
BLADE SURG 15 STRL SS (BLADE) ×1
BNDG ESMARK 4X12 BLUE STRL LF (DISPOSABLE) ×2
CLOTH BEACON ORANGE TIMEOUT ST (SAFETY) ×2 IMPLANT
COVER SURGICAL LIGHT HANDLE (MISCELLANEOUS) ×4 IMPLANT
CUFF TOURNIQUET SINGLE 18IN (TOURNIQUET CUFF) ×2 IMPLANT
DECANTER SPIKE VIAL GLASS SM (MISCELLANEOUS) ×2 IMPLANT
ELECT NEEDLE TIP 2.8 STRL (NEEDLE) ×2 IMPLANT
ELECT REM PT RETURN 9FT ADLT (ELECTROSURGICAL) ×2
ELECTRODE REM PT RTRN 9FT ADLT (ELECTROSURGICAL) ×1 IMPLANT
FORMALIN 10 PREFIL 120ML (MISCELLANEOUS) ×2 IMPLANT
GLOVE BIOGEL PI IND STRL 7.0 (GLOVE) ×1 IMPLANT
GLOVE BIOGEL PI IND STRL 7.5 (GLOVE) ×1 IMPLANT
GLOVE BIOGEL PI INDICATOR 7.0 (GLOVE) ×1
GLOVE BIOGEL PI INDICATOR 7.5 (GLOVE) ×1
GLOVE ECLIPSE 7.0 STRL STRAW (GLOVE) ×2 IMPLANT
GLOVE SKINSENSE NS SZ7.0 (GLOVE) ×1
GLOVE SKINSENSE STRL SZ7.0 (GLOVE) ×1 IMPLANT
GOWN STRL REIN XL XLG (GOWN DISPOSABLE) ×4 IMPLANT
KIT ROOM TURNOVER APOR (KITS) ×2 IMPLANT
MANIFOLD NEPTUNE II (INSTRUMENTS) ×2 IMPLANT
NEEDLE HYPO 25X1 1.5 SAFETY (NEEDLE) ×2 IMPLANT
NS IRRIG 1000ML POUR BTL (IV SOLUTION) ×2 IMPLANT
PACK BASIC LIMB (CUSTOM PROCEDURE TRAY) ×2 IMPLANT
PAD ARMBOARD 7.5X6 YLW CONV (MISCELLANEOUS) ×2 IMPLANT
SET BASIN LINEN APH (SET/KITS/TRAYS/PACK) ×2 IMPLANT
SOL PREP PROV IODINE SCRUB 4OZ (MISCELLANEOUS) ×2 IMPLANT
SPLINT WRIST COCKUP LF UNIV (ORTHOPEDIC SUPPLIES)
SPLINT WRIST COCKUP RT UNIV (ORTHOPEDIC SUPPLIES) ×2
SPONGE GAUZE 4X4 12PLY (GAUZE/BANDAGES/DRESSINGS) ×2 IMPLANT
SUPPORT UNIV WRIST LACER 8 (ORTHOPEDIC SUPPLIES) IMPLANT
SUPPORT WRIST RIGHT UNIV 8 (ORTHOPEDIC SUPPLIES) ×1 IMPLANT
SUT ETHILON 5 0 P 3 18 (SUTURE) ×2
SUT NYLON ETHILON 5-0 P-3 1X18 (SUTURE) ×2 IMPLANT
SUT VIC AB 4-0 SH 27 (SUTURE) ×1
SUT VIC AB 4-0 SH 27XBRD (SUTURE) ×1 IMPLANT
SYR CONTROL 10ML LL (SYRINGE) ×2 IMPLANT
TAPE SURG TRANSPORE 1 IN (GAUZE/BANDAGES/DRESSINGS) ×1 IMPLANT
TAPE SURGICAL TRANSPORE 1 IN (GAUZE/BANDAGES/DRESSINGS) ×1
TOWEL OR 17X26 4PK STRL BLUE (TOWEL DISPOSABLE) ×2 IMPLANT
WATER STERILE IRR 1000ML POUR (IV SOLUTION) ×4 IMPLANT

## 2012-01-03 NOTE — Op Note (Signed)
NAMEGLADSTONE, ROSAS NO.:  [id number]  MEDICAL RECORD NO.:  [id number]  LOCATION:  [facility]                          FACILITY:  [facility]  PHYSICIAN:  Barbaraann Barthel, M.D. DATE OF BIRTH:  26-Jun-1932  DATE OF PROCEDURE:  01/03/2012 DATE OF DISCHARGE:  01/03/2012                              OPERATIVE REPORT   DIAGNOSIS:  Ganglionic cyst, right wrist, dorsal aspect.  Note the patient states that he has had increasing discomfort and swelling in the area over the dorsal aspect of his right wrist.  This had increased in size and we had actually scheduled him for surgery, however, he canceled due to family problems at that time and then reappeared to my office for the excision of what I thought was a ganglion cyst.  We discussed complications not limited to, but including bleeding, infection, and recurrence.  Informed consent was obtained.  GROSS OPERATIVE FINDINGS:  The patient had a ganglion cyst that was located on the dorsal aspect of the right wrist.  This was adherent to the extensor tendon sheaths of the hand.  We were able to remove this without any problems.  SPECIMEN:  Ganglion cyst of right wrist.  WOUND CLASSIFICATION:  Clean.  TOURNIQUET:  250 mmHg for 27 minutes approximately.  TECHNIQUE:  The patient was placed in the supine position.  After prepping his right extremity with Betadine solution and evacuating venous circulation with an Esmarch bandage, tourniquet was inflated to 250 mmHg.  We then placed limb isolator and I made a longitudinal incision over the area of the palpated ganglion cyst on the dorsal aspect of his right wrist.  This was removed from the tendon sheaths of his extensor pollicis longus as well as the extensor retinaculum area and this was sent as a specimen.  Bleeding was minimal.  We then irrigated with normal saline solution after removing this and closed the skin with interrupted 5-0 nylon sutures.  Neosporin, 4x4s,  Kling dressing with a cock-up splint was applied.  Prior to closure, all sponge, needle, and instrument counts were found to be correct. Estimated blood loss was minimal.  The patient tolerated the procedure well.  Total tourniquet time as stated was around 27 minutes and there were no complications.     Barbaraann Barthel, M.D.     WB/MEDQ  D:  01/03/2012  T:  01/03/2012  Job:  [id number]  cc:   Melvyn Novas, MD Fax: [phone]

## 2012-01-03 NOTE — Brief Op Note (Signed)
01/03/2012  10:15 AM  PATIENT:  Randy [name]  78 y.o. male  PRE-OPERATIVE DIAGNOSIS:  ganglion cyst right wrist  POST-OPERATIVE DIAGNOSIS:  ganglion cyst right wrist  PROCEDURE:  Procedure(s) (LRB): REMOVAL GANGLION OF WRIST (Right)  SURGEON:  Surgeon(s) and Role:    * Marlane Hatcher, MD - Primary  PHYSICIAN ASSISTANT:   ASSISTANTS: none   ANESTHESIA:   none  EBL:  Total I/O In: 400 [I.V.:400] Out: 0   BLOOD ADMINISTERED:none  DRAINS: none   LOCAL MEDICATIONS USED:  NONE  SPECIMEN:  Source of Specimen:  ganglion cyst Right wrist  DISPOSITION OF SPECIMEN:  PATHOLOGY  COUNTS:  YES  TOURNIQUET:   Total Tourniquet Time Documented: Upper Arm (Right) - 28 minutes  DICTATION: .Other Dictation: Dictation Number Dict. # [id number]  PLAN OF CARE: Discharge to home after PACU  PATIENT DISPOSITION:  PACU - hemodynamically stable.   Delay start of Pharmacological VTE agent (>24hrs) due to surgical blood loss or risk of bleeding: not applicable

## 2012-01-03 NOTE — Progress Notes (Signed)
80 yr. Old B. Male with ganglion of dorsum of Right wrist for excision.  Site marked and labs reviewed.  In formed consent obtained.  No acute changes since H&P dictated. (# [id number]).  98.3 BP 123/62, HR 72/min, resp. 20/min, O2 Sat 100%.

## 2012-01-03 NOTE — Discharge Instructions (Signed)
Ganglion A ganglion is a swelling under the skin that is filled with a thick, jelly-like substance. It is a synovial cyst. This is caused by a break (rupture) of the joint lining from the joint space. A ganglion often occurs near an area of repeated minor trauma (damage caused by an accident). Trauma may also be a repetitive movement at work or in a sport. TREATMENT   It often goes away without treatment. It may reappear later. Sometimes a ganglion may need to be surgically removed. Often they are drained and injected with a steroid. Sometimes they respond to:  Rest.   Splinting.  HOME CARE INSTRUCTIONS    Your caregiver will decide the best way of treating your ganglion. Do not try to break the ganglion yourself by pressing on it, poking it with a needle, or hitting it with a heavy object.   Use medications as directed.  SEEK MEDICAL CARE IF:    The ganglion becomes larger or more painful.   You have increased redness or swelling.   You have weakness or numbness in your hand or wrist.  MAKE SURE YOU:    Understand these instructions.   Will watch your condition.   Will get help right away if you are not doing well or get worse.  Document Released: 09/06/2000 Document Revised: 08/29/2011 Document Reviewed: 11/03/2007 [employer] Patient Information 2012 [employer]. 

## 2012-01-03 NOTE — Preoperative (Signed)
Beta Blockers   Reason not to administer Beta Blockers:Not Applicable 

## 2012-01-03 NOTE — Transfer of Care (Signed)
Immediate Anesthesia Transfer of Care Note  Patient: Randy [name]  Procedure(s) Performed: Procedure(s) (LRB): REMOVAL GANGLION OF WRIST (Right)  Patient Location: PACU  Anesthesia Type: Regional and MAC combined with regional for post-op pain  Level of Consciousness: awake, alert  and oriented  Airway & Oxygen Therapy: Patient Spontanous Breathing and Patient connected to nasal cannula oxygen  Post-op Assessment: Report given to PACU RN  Post vital signs: Reviewed  Complications: No apparent anesthesia complications

## 2012-01-03 NOTE — Anesthesia Postprocedure Evaluation (Signed)
  Anesthesia Post-op Note  Patient: Randy [name]  Procedure(s) Performed: Procedure(s) (LRB): REMOVAL GANGLION OF WRIST (Right)  Patient Location: PACU  Anesthesia Type: MAC combined with regional for post-op pain  Level of Consciousness: awake, alert  and oriented  Airway and Oxygen Therapy: Patient Spontanous Breathing and Patient connected to nasal cannula oxygen  Post-op Pain: none  Post-op Assessment: Post-op Vital signs reviewed, Patient's Cardiovascular Status Stable, Respiratory Function Stable, Patent Airway and No signs of Nausea or vomiting  Post-op Vital Signs: Reviewed and stable  Complications: No apparent anesthesia complications

## 2012-01-03 NOTE — Anesthesia Procedure Notes (Signed)
Anesthesia Regional Block:  Bier block (IV Regional)  Pre-Anesthetic Checklist: ,, timeout performed, Correct Patient, Correct Site, Correct Laterality, Correct Procedure, Correct Position, site marked, Risks and benefits discussed,  Surgical consent,  Pre-op evaluation,  At surgeon's request and post-op pain management Bier block (IV Regional) Narrative:      

## 2012-01-03 NOTE — Addendum Note (Signed)
Addendum  created 01/03/12 1018 by Roselie Awkward, MD   Modules edited:Anesthesia Attestations

## 2012-01-03 NOTE — Anesthesia Preprocedure Evaluation (Signed)
Anesthesia Evaluation  Patient identified by MRN, date of birth, ID band Patient awake    Reviewed: Allergy & Precautions, H&P , NPO status , Patient's Chart, lab work & pertinent test results, reviewed documented beta blocker date and time   Airway Mallampati: I TM Distance: >3 FB Neck ROM: Full    Dental  (+) Edentulous Upper and Edentulous Lower   Pulmonary neg pulmonary ROS,    Pulmonary exam normal       Cardiovascular hypertension, Pt. on medications and Pt. on home beta blockers Rhythm:Regular Rate:Normal     Neuro/Psych  Neuromuscular disease (Restless leg syndrome) negative psych ROS   GI/Hepatic negative GI ROS, Neg liver ROS,   Endo/Other  negative endocrine ROS  Renal/GU Renal InsufficiencyRenal disease (Polycystic kidney disease, adrenal mass)     Musculoskeletal  (+) Arthritis - (Gout),   Abdominal Normal abdominal exam  (+)   Peds  Hematology  (+) Blood dyscrasia, anemia ,   Anesthesia Other Findings   Reproductive/Obstetrics                           Anesthesia Physical Anesthesia Plan  ASA: III  Anesthesia Plan: Bier Block   Post-op Pain Management:    Induction: Intravenous  Airway Management Planned: Nasal Cannula  Additional Equipment:   Intra-op Plan:   Post-operative Plan:   Informed Consent: I have reviewed the patients History and Physical, chart, labs and discussed the procedure including the risks, benefits and alternatives for the proposed anesthesia with the patient or authorized representative who has indicated his/her understanding and acceptance.     Plan Discussed with: CRNA  Anesthesia Plan Comments:         Anesthesia Quick Evaluation

## 2012-01-07 MED FILL — Bupivacaine HCl Preservative Free (PF) Inj 0.5%: INTRAMUSCULAR | Qty: 30 | Status: AC

## 2012-01-08 ENCOUNTER — Encounter (HOSPITAL_COMMUNITY): Payer: Self-pay | Admitting: General Surgery

## 2012-06-23 ENCOUNTER — Inpatient Hospital Stay (HOSPITAL_COMMUNITY): Payer: Medicare Other

## 2012-06-23 ENCOUNTER — Emergency Department (HOSPITAL_COMMUNITY): Payer: Medicare Other

## 2012-06-23 ENCOUNTER — Encounter (HOSPITAL_COMMUNITY): Payer: Self-pay

## 2012-06-23 ENCOUNTER — Inpatient Hospital Stay (HOSPITAL_COMMUNITY)
Admission: EM | Admit: 2012-06-23 | Discharge: 2012-06-27 | DRG: 291 | Disposition: A | Discharge status: Home / Self Care | Payer: Medicare Other | Attending: Family Medicine | Admitting: Family Medicine

## 2012-06-23 DIAGNOSIS — I359 Nonrheumatic aortic valve disorder, unspecified: Secondary | ICD-10-CM

## 2012-06-23 DIAGNOSIS — Z23 Encounter for immunization: Secondary | ICD-10-CM

## 2012-06-23 DIAGNOSIS — I2589 Other forms of chronic ischemic heart disease: Secondary | ICD-10-CM | POA: Diagnosis present

## 2012-06-23 DIAGNOSIS — J9600 Acute respiratory failure, unspecified whether with hypoxia or hypercapnia: Secondary | ICD-10-CM

## 2012-06-23 DIAGNOSIS — D3500 Benign neoplasm of unspecified adrenal gland: Secondary | ICD-10-CM | POA: Diagnosis present

## 2012-06-23 DIAGNOSIS — N170 Acute kidney failure with tubular necrosis: Secondary | ICD-10-CM | POA: Diagnosis present

## 2012-06-23 DIAGNOSIS — J810 Acute pulmonary edema: Secondary | ICD-10-CM

## 2012-06-23 DIAGNOSIS — N184 Chronic kidney disease, stage 4 (severe): Secondary | ICD-10-CM | POA: Diagnosis present

## 2012-06-23 DIAGNOSIS — I5023 Acute on chronic systolic (congestive) heart failure: Secondary | ICD-10-CM | POA: Diagnosis present

## 2012-06-23 DIAGNOSIS — G5600 Carpal tunnel syndrome, unspecified upper limb: Secondary | ICD-10-CM | POA: Diagnosis present

## 2012-06-23 DIAGNOSIS — I472 Ventricular tachycardia, unspecified: Secondary | ICD-10-CM | POA: Diagnosis not present

## 2012-06-23 DIAGNOSIS — I4729 Other ventricular tachycardia: Secondary | ICD-10-CM | POA: Diagnosis not present

## 2012-06-23 DIAGNOSIS — Z87891 Personal history of nicotine dependence: Secondary | ICD-10-CM

## 2012-06-23 DIAGNOSIS — N183 Chronic kidney disease, stage 3 unspecified: Secondary | ICD-10-CM

## 2012-06-23 DIAGNOSIS — I429 Cardiomyopathy, unspecified: Secondary | ICD-10-CM

## 2012-06-23 DIAGNOSIS — N039 Chronic nephritic syndrome with unspecified morphologic changes: Secondary | ICD-10-CM | POA: Diagnosis present

## 2012-06-23 DIAGNOSIS — I10 Essential (primary) hypertension: Secondary | ICD-10-CM

## 2012-06-23 DIAGNOSIS — Z79899 Other long term (current) drug therapy: Secondary | ICD-10-CM

## 2012-06-23 DIAGNOSIS — E876 Hypokalemia: Secondary | ICD-10-CM | POA: Diagnosis not present

## 2012-06-23 DIAGNOSIS — N189 Chronic kidney disease, unspecified: Secondary | ICD-10-CM

## 2012-06-23 DIAGNOSIS — M109 Gout, unspecified: Secondary | ICD-10-CM

## 2012-06-23 DIAGNOSIS — E785 Hyperlipidemia, unspecified: Secondary | ICD-10-CM

## 2012-06-23 DIAGNOSIS — D631 Anemia in chronic kidney disease: Secondary | ICD-10-CM

## 2012-06-23 DIAGNOSIS — G2581 Restless legs syndrome: Secondary | ICD-10-CM | POA: Diagnosis present

## 2012-06-23 DIAGNOSIS — N2581 Secondary hyperparathyroidism of renal origin: Secondary | ICD-10-CM | POA: Diagnosis present

## 2012-06-23 DIAGNOSIS — I509 Heart failure, unspecified: Secondary | ICD-10-CM | POA: Diagnosis present

## 2012-06-23 DIAGNOSIS — M129 Arthropathy, unspecified: Secondary | ICD-10-CM | POA: Diagnosis present

## 2012-06-23 DIAGNOSIS — Q613 Polycystic kidney, unspecified: Secondary | ICD-10-CM

## 2012-06-23 DIAGNOSIS — I130 Hypertensive heart and chronic kidney disease with heart failure and stage 1 through stage 4 chronic kidney disease, or unspecified chronic kidney disease: Principal | ICD-10-CM | POA: Diagnosis present

## 2012-06-23 HISTORY — DX: Gout, unspecified: M10.9

## 2012-06-23 LAB — BASIC METABOLIC PANEL
CO2: 25 mEq/L (ref 19–32)
Chloride: 103 mEq/L (ref 96–112)
Glucose, Bld: 153 mg/dL — ABNORMAL HIGH (ref 70–99)
Sodium: 137 mEq/L (ref 135–145)

## 2012-06-23 LAB — BLOOD GAS, ARTERIAL
Acid-Base Excess: 3.7 mmol/L — ABNORMAL HIGH (ref 0.0–2.0)
Delivery systems: POSITIVE
Drawn by: 28701
Expiratory PAP: 8
Inspiratory PAP: 16
O2 Saturation: 99.3 %

## 2012-06-23 LAB — MAGNESIUM: Magnesium: 2 mg/dL (ref 1.5–2.5)

## 2012-06-23 LAB — TROPONIN I
Troponin I: <0.3 ng/mL (ref <0.30)
Troponin I: <0.3 ng/mL (ref <0.30)

## 2012-06-23 LAB — CBC WITH DIFFERENTIAL/PLATELET
Basophils Absolute: 0 10*3/uL (ref 0.0–0.1)
HCT: 33.4 % — ABNORMAL LOW (ref 39.0–52.0)
Lymphocytes Relative: 23 % (ref 12–46)
Lymphs Abs: 2.2 10*3/uL (ref 0.7–4.0)
Monocytes Absolute: 0.6 10*3/uL (ref 0.1–1.0)
Neutro Abs: 6.6 10*3/uL (ref 1.7–7.7)
Platelets: 250 10*3/uL (ref 150–400)
RBC: 3.78 MIL/uL — ABNORMAL LOW (ref 4.22–5.81)
RDW: 15.6 % — ABNORMAL HIGH (ref 11.5–15.5)
WBC: 9.7 10*3/uL (ref 4.0–10.5)

## 2012-06-23 LAB — PRO B NATRIURETIC PEPTIDE: Pro B Natriuretic peptide (BNP): 8492 pg/mL — ABNORMAL HIGH (ref 0–450)

## 2012-06-23 MED ORDER — FUROSEMIDE 10 MG/ML IJ SOLN
80.0000 mg | Freq: Two times a day (BID) | INTRAMUSCULAR | Status: DC
  Administered 2012-06-23 – 2012-06-24 (×3): 80 mg via INTRAVENOUS
  Filled 2012-06-23 (×3): qty 8

## 2012-06-23 MED ORDER — NITROGLYCERIN IN D5W 200-5 MCG/ML-% IV SOLN
5.0000 ug/min | Freq: Once | INTRAVENOUS | Status: AC
  Administered 2012-06-23: 5 ug/min via INTRAVENOUS
  Filled 2012-06-23: qty 250

## 2012-06-23 MED ORDER — FUROSEMIDE 10 MG/ML IJ SOLN
40.0000 mg | Freq: Once | INTRAMUSCULAR | Status: DC
  Filled 2012-06-23: qty 4

## 2012-06-23 MED ORDER — CLONIDINE HCL 0.1 MG PO TABS
0.1000 mg | ORAL_TABLET | Freq: Three times a day (TID) | ORAL | Status: DC
  Administered 2012-06-23 – 2012-06-25 (×8): 0.1 mg via ORAL
  Filled 2012-06-23 (×9): qty 1

## 2012-06-23 MED ORDER — CLONIDINE HCL 0.1 MG PO TABS: 0.1000 mg | ORAL_TABLET | Freq: Two times a day (BID) | ORAL | Status: DC

## 2012-06-23 MED ORDER — ONDANSETRON HCL 4 MG/2ML IJ SOLN: 4.0000 mg | Freq: Four times a day (QID) | INTRAMUSCULAR | Status: DC | PRN

## 2012-06-23 MED ORDER — ASPIRIN EC 81 MG PO TBEC
81.0000 mg | DELAYED_RELEASE_TABLET | Freq: Every day | ORAL | Status: DC
  Administered 2012-06-23 – 2012-06-26 (×4): 81 mg via ORAL
  Filled 2012-06-23 (×4): qty 1

## 2012-06-23 MED ORDER — LABETALOL HCL 200 MG PO TABS
400.0000 mg | ORAL_TABLET | Freq: Two times a day (BID) | ORAL | Status: DC
  Administered 2012-06-23 – 2012-06-26 (×8): 400 mg via ORAL
  Filled 2012-06-23 (×8): qty 2

## 2012-06-23 MED ORDER — ISOSORBIDE MONONITRATE ER 60 MG PO TB24
30.0000 mg | ORAL_TABLET | Freq: Every day | ORAL | Status: DC
  Administered 2012-06-23 – 2012-06-24 (×2): 30 mg via ORAL
  Filled 2012-06-23 (×2): qty 1

## 2012-06-23 MED ORDER — SODIUM CHLORIDE 0.9 % IJ SOLN: 3.0000 mL | INTRAMUSCULAR | Status: DC | PRN

## 2012-06-23 MED ORDER — HEPARIN SODIUM (PORCINE) 5000 UNIT/ML IJ SOLN
5000.0000 [IU] | Freq: Three times a day (TID) | INTRAMUSCULAR | Status: DC
  Administered 2012-06-23 – 2012-06-26 (×12): 5000 [IU] via SUBCUTANEOUS
  Filled 2012-06-23 (×13): qty 1

## 2012-06-23 MED ORDER — INFLUENZA VIRUS VACC SPLIT PF IM SUSP
0.5000 mL | Freq: Once | INTRAMUSCULAR | Status: AC
  Administered 2012-06-23: 0.5 mL via INTRAMUSCULAR
  Filled 2012-06-23: qty 0.5

## 2012-06-23 MED ORDER — NITROGLYCERIN IN D5W 200-5 MCG/ML-% IV SOLN
2.0000 ug/min | INTRAVENOUS | Status: DC
  Administered 2012-06-23: 10 ug/min via INTRAVENOUS

## 2012-06-23 MED ORDER — AMLODIPINE BESYLATE 5 MG PO TABS
10.0000 mg | ORAL_TABLET | Freq: Every morning | ORAL | Status: DC
  Administered 2012-06-23 – 2012-06-26 (×4): 10 mg via ORAL
  Filled 2012-06-23 (×4): qty 2

## 2012-06-23 MED ORDER — CLONIDINE HCL 0.2 MG PO TABS
0.2000 mg | ORAL_TABLET | Freq: Three times a day (TID) | ORAL | Status: DC
  Administered 2012-06-23: 0.2 mg via ORAL
  Filled 2012-06-23: qty 1

## 2012-06-23 MED ORDER — FUROSEMIDE 10 MG/ML IJ SOLN
40.0000 mg | Freq: Once | INTRAMUSCULAR | Status: AC
  Administered 2012-06-23: 40 mg via INTRAVENOUS
  Filled 2012-06-23: qty 4

## 2012-06-23 MED ORDER — FUROSEMIDE 10 MG/ML IJ SOLN
40.0000 mg | Freq: Once | INTRAMUSCULAR | Status: AC
  Administered 2012-06-23: 40 mg via INTRAVENOUS

## 2012-06-23 MED ORDER — ATORVASTATIN CALCIUM 40 MG PO TABS
40.0000 mg | ORAL_TABLET | Freq: Every day | ORAL | Status: DC
  Administered 2012-06-23 – 2012-06-26 (×4): 40 mg via ORAL
  Filled 2012-06-23 (×4): qty 1

## 2012-06-23 MED ORDER — HYDRALAZINE HCL 10 MG PO TABS
10.0000 mg | ORAL_TABLET | Freq: Four times a day (QID) | ORAL | Status: DC
  Administered 2012-06-23 – 2012-06-25 (×10): 10 mg via ORAL
  Filled 2012-06-23 (×22): qty 1

## 2012-06-23 MED ORDER — SODIUM CHLORIDE 0.9 % IV SOLN: 250.0000 mL | INTRAVENOUS | Status: DC | PRN

## 2012-06-23 MED ORDER — ACETAMINOPHEN 325 MG PO TABS
650.0000 mg | ORAL_TABLET | ORAL | Status: DC | PRN
  Administered 2012-06-24: 650 mg via ORAL
  Filled 2012-06-23: qty 2

## 2012-06-23 MED ORDER — SODIUM CHLORIDE 0.9 % IJ SOLN
3.0000 mL | Freq: Two times a day (BID) | INTRAMUSCULAR | Status: DC
  Administered 2012-06-23 – 2012-06-26 (×8): 3 mL via INTRAVENOUS
  Filled 2012-06-23 (×7): qty 3

## 2012-06-23 NOTE — Progress Notes (Signed)
  Echocardiogram 2D Echocardiogram has been performed.  Antony Salmon, RCS 06/23/2012, 10:37 AM

## 2012-06-23 NOTE — Consult Note (Signed)
Reason for Consult: Worsening of renal failure Referring Physician: Dr. Krista Blue is an 78 y.o. male.  HPI: He is a patient wheeze history of chronic renal failure stage III, hypertension and gout presently had came with complaints of difficulty in breathing. During evaluation patient was found to CHF and was a systolic blood pressure above 161. Presently her patient says that he's feeling better doesn't have any nausea or vomiting . According to the patient is blood pressure has been reasonably controlled until recently. The last of weeks and months his blood pressure has been fluctuating. According to the patient her blood pressure started becoming an issue after one of his blood pressure was stopped because of possible allergy. Presently is feeling better he doesn't have any nausea no vomiting no difficulty increasing.  Past Medical History  Diagnosis Date  . Hypertension   . Hypercholesterolemia   . Anemia   . Chronic kidney disease   . Gout     Past Surgical History  Procedure Date  . Knee surgery 2007    right knee  . Back surgery 2004  . Hemorrhoid surgery 40 years ago  . Ganglion cyst excision 01/03/2012    Procedure: REMOVAL GANGLION OF WRIST;  Surgeon: Marlane Hatcher, MD;  Location: [facility];  Service: General;  Laterality: Right;    Family History  Problem Relation Age of Onset  . Arthritis    . Cancer    . Kidney disease    . Anesthesia problems Neg Hx   . Hypotension Neg Hx   . Malignant hyperthermia Neg Hx   . Pseudochol deficiency Neg Hx     Social History:  reports that he has quit smoking. His smoking use included Cigarettes. He has a 30 pack-year smoking history. He quit smokeless tobacco use about 18 years ago. He reports that he does not drink alcohol or use illicit drugs.  Allergies: No Known Allergies  Medications: I have reviewed the patient's current medications.  Results for orders placed during the hospital encounter of 06/23/12  (from the past 48 hour(s))  CBC WITH DIFFERENTIAL     Status: Abnormal   Collection Time   06/23/12  2:30 AM      Component Value Range Comment   WBC 9.7  4.0 - 10.5 K/uL    RBC 3.78 (*) 4.22 - 5.81 MIL/uL    Hemoglobin 10.8 (*) 13.0 - 17.0 g/dL    HCT 09.6 (*) 04.5 - 52.0 %    MCV 88.4  78.0 - 100.0 fL    MCH 28.6  26.0 - 34.0 pg    MCHC 32.3  30.0 - 36.0 g/dL    RDW 40.9 (*) 81.1 - 15.5 %    Platelets 250  150 - 400 K/uL    Neutrophils Relative 68  43 - 77 %    Neutro Abs 6.6  1.7 - 7.7 K/uL    Lymphocytes Relative 23  12 - 46 %    Lymphs Abs 2.2  0.7 - 4.0 K/uL    Monocytes Relative 7  3 - 12 %    Monocytes Absolute 0.6  0.1 - 1.0 K/uL    Eosinophils Relative 3  0 - 5 %    Eosinophils Absolute 0.3  0.0 - 0.7 K/uL    Basophils Relative 0  0 - 1 %    Basophils Absolute 0.0  0.0 - 0.1 K/uL   BASIC METABOLIC PANEL     Status: Abnormal   Collection  Time   06/23/12  2:30 AM      Component Value Range Comment   Sodium 137  135 - 145 mEq/L    Potassium 3.8  3.5 - 5.1 mEq/L    Chloride 103  96 - 112 mEq/L    CO2 25  19 - 32 mEq/L    Glucose, Bld 153 (*) 70 - 99 mg/dL    BUN 37 (*) 6 - 23 mg/dL    Creatinine, Ser 1.61 (*) 0.50 - 1.35 mg/dL    Calcium 8.9  8.4 - 09.6 mg/dL    GFR calc non Af Amer 17 (*) >90 mL/min    GFR calc Af Amer 19 (*) >90 mL/min   TROPONIN I     Status: Normal   Collection Time   06/23/12  2:30 AM      Component Value Range Comment   Troponin I <0.30  <0.30 ng/mL   PRO B NATRIURETIC PEPTIDE     Status: Abnormal   Collection Time   06/23/12  2:30 AM      Component Value Range Comment   Pro B Natriuretic peptide (BNP) 8492.0 (*) 0 - 450 pg/mL   BLOOD GAS, ARTERIAL     Status: Abnormal   Collection Time   06/23/12  3:00 AM      Component Value Range Comment   FIO2 60.00      Delivery systems BILEVEL POSITIVE AIRWAY PRESSURE      Inspiratory PAP 16.0      Expiratory PAP 8.0      pH, Arterial 7.319 (*) 7.350 - 7.450    pCO2 arterial 42.8  35.0 - 45.0 mmHg     pO2, Arterial 206.0 (*) 80.0 - 100.0 mmHg    Bicarbonate 21.4  20.0 - 24.0 mEq/L    TCO2 19.8  0 - 100 mmol/L    Acid-Base Excess 3.7 (*) 0.0 - 2.0 mmol/L    O2 Saturation 99.3      Collection site RIGHT RADIAL      Drawn by [phone]      Sample type ARTERIAL      Allens test (pass/fail) PASS  PASS   TROPONIN I     Status: Normal   Collection Time   06/23/12  5:57 AM      Component Value Range Comment   Troponin I <0.30  <0.30 ng/mL   MAGNESIUM     Status: Normal   Collection Time   06/23/12  5:57 AM      Component Value Range Comment   Magnesium 2.0  1.5 - 2.5 mg/dL     Dg Chest 1 View  98/09/1912  *RADIOLOGY REPORT*  Clinical Data: Respiratory distress  CHEST - 1 VIEW  Comparison: None.  Findings: Heart size upper normal to mildly enlarged.  Mild mediastinal prominence.  Central vascular congestion/hilar prominence.  Perihilar and bibasilar opacities.  Small left pleural effusions suggested.  Associated opacity.  No definite pneumothorax.  No acute osseous finding.  IMPRESSION: Prominent cardiomediastinal contours with interstitial and airspace opacities.  This may reflect pulmonary edema or infection in the appropriate clinical setting.  Small left pleural effusion suggested with associate consolidation, likely atelectasis.  Recommend radiographic follow-up after appropriate therapy to document resolution.   Original Report Authenticated By: Waneta Martins, M.D.     Review of Systems  Constitutional: Negative for chills.  Respiratory: Positive for shortness of breath. Negative for sputum production and wheezing.   Cardiovascular: Positive for orthopnea, leg swelling and PND.  Gastrointestinal: Negative for nausea and vomiting.   Blood pressure 184/88, pulse 82, temperature 97.8 F (36.6 C), temperature source Oral, resp. rate 14, height 5\' 9"  (1.753 m), weight 81.5 kg (179 lb 10.8 oz), SpO2 95.00%. Physical Exam  Eyes: No scleral icterus.  Neck: No JVD present.  Respiratory:  No respiratory distress. He has no wheezes. He has rales.  GI: He exhibits no distension. There is no rebound and no guarding.  Musculoskeletal: He exhibits edema.    Assessment/Plan:  problem #1 renal failure acute on chronic. His BUN and creatinine has increased  from his baseline. This could be secondary to natural progression of His disease or from malignant hypertension. The etiology for his chronic renal failure thought to be secondary to hypertensive nephrosclerosis however patient was multiple cysts in his kidneys but no vomiting of polycystic kidney disease. Problem #2 hypertension  At this moment seems to be malignant. Patient with right adrenal adenoma hence need to rule out hyperaldosteronism. And now his blood pressure has been controlled reasonably good. At this moment her blood sugar which antihypertensive medication was taken out. Problem #3 history of flash pulmonary edema most likely secondary to his uncontrolled hypertension recently improving Problem #4 multiple cysts in his kidney. Acquired versus adult onset polycystic kidney disease type II. At this moment there is no family history. Problem #5 arthritis Problem #6 restless leg syndrome Problem #7 history of anemia  Most like related to his chronic renal failure. Plan: We'll check renin and aldosterone level We'll check iron studies and We'll check intact PTH and 25 vitamin D level We'll start him on low dose of labetalol 100 mg by mouth twice a day Right hip his systolic blood pressure around 160. We'll check his basic metabolic panel in the morning.  Srishti Strnad S 06/23/2012, 8:45 AM

## 2012-06-23 NOTE — Progress Notes (Signed)
Randy [name], Randy [name] NO.:  [id number]  MEDICAL RECORD NO.:  [id number]  LOCATION:  [facility]01                          FACILITY:  [facility]  PHYSICIAN:  Melvyn Novas, MDDATE OF BIRTH:  06-06-1932  DATE OF PROCEDURE: DATE OF DISCHARGE:                                PROGRESS NOTE   The patient was admitted last time with accelerated hypertension.  He had some chronic renal failure, was waiting to see his first visit to Nephrology in 2 week's time, has hyperlipidemia and came in with an elevated blood pressure of 203/114, pulmonary edema clinically and radiographically, blood pressure significantly improved this morning, but only down to 170 systolic.  Hemoglobin 10.8, creatinine 3.25. Cardiac enzymes negative x2.  Temperature 97.7, pulse 71 and regular, respiratory rate is 18, O2 sat is 99%.  Lungs show diminished breath sounds at the bases with prolonged expiratory phase.  No rales audible. No wheeze. Heart regular rhythm.  No S3, S4, appreciable this morning.  No heaves, thrills or rubs.  The plan right now is to continue serial monitoring of renal function. Obtain renal consult as well as Cardiology consultation.  Continue Norvasc 10.  Clonidine will be increased to 0.2 mg p.o. t.i.d.  Continue labetalol 400 p.o. b.i.d. and monitor hemodynamics.  We will make further recommendations as the database expands.  We will also obtain renal ultrasound.     Melvyn Novas, MD     RMD/MEDQ  D:  06/23/2012  T:  06/23/2012  Job:  [id number]

## 2012-06-23 NOTE — Consult Note (Signed)
CARDIOLOGY CONSULT NOTE  Patient ID: Randy [name], MRN: [id number], DOB/AGE: 02/23/1932 78 y.o. Admit date: 06/23/2012   Date of Consult: 06/23/2012 Primary Physician: Isabella Stalling, MD  Chief Complaint: SOB Reason for Consult: CHF  HPI: Randy [name] is a 78 y/o M with hx of HTN, HL, CKD who presented to [facility] and was found to be markedly hypertensive with CHF. It has been thought in the past that CKD was due to hypertensive nephrosclerosis although the patient has multiple cysts in his kidneys and is being seen by nephrology this admission. Of note, he does have indomethacin listed PRN on his med list. The patient reported recent fluctuation of blood pressure. One of his BP meds was recently stopped due to a possible allergy (details not clear).  He states he has had chronic leg edema, mainly when he is up on his feet, for at least the last year. Within the last 2 weeks he has been gradually more short of breath culminating in significant resting breathlessness prompting admission to the hospital. He denies any palpitations or chest pain. States that he has come been compliant with his outpatient regimen.  BP was 203/114 on admission. He was in acute respiratory distress requiring BiPAP. He was given 40mg  IV Lasix (0255), 40mg  IV Lasix (4098), and 80mg  IV Lasix (1191). I/Os net -303 thus far. Not clear if weights are accurate -- at 242, wt 200lb then 179 just a short while after today?. He was placed on low dose NTG gtt. He is also on labetalol, clonidine, and amlodipine. CXR: prominent cardiomediastinal contours with interstitial and airspace opacities; may reflect pulmonary edema or infection; small L pleural effusion suggested with associated consolidation likely atx. Labs significant for ?BUN/Cr 37/3.25 (Cr 2.94 in April), pH 7.319 wih PCO2 WNL 42, PO2 206, bicarb 21, pBNP 8492, troponins neg x 2, ?Hgb 10.8 (normocytic) felt likely due to AOCD due to renal dz. Glucose is also elevated at 153 (no hx  of DM). \  Echocardiogram done earlier today shows mild LVH, mild to moderate LV dysfunction with LVEF 40-45%, largely global hypokinesis although more prominent in the inferoposterior wall. Grade 1 diastolic dysfunction. No major valvular abnormalities, PA pressure approximately 31 mm mercury.  Past Medical History  Diagnosis Date  . Hypertension   . Hypercholesterolemia   . Anemia   . Chronic kidney disease   . Gout       Surgical History:  Past Surgical History  Procedure Date  . Knee surgery 2007    right knee  . Back surgery 2004  . Hemorrhoid surgery 40 years ago  . Ganglion cyst excision 01/03/2012    Procedure: REMOVAL GANGLION OF WRIST;  Surgeon: Marlane Hatcher, MD;  Location: [facility];  Service: General;  Laterality: Right;     Home Meds: Prior to Admission medications   Medication Sig Start Date End Date Taking? Authorizing Provider  allopurinol (ZYLOPRIM) 300 MG tablet Take 300 mg by mouth every morning.     Historical Provider, MD  amLODipine (NORVASC) 10 MG tablet Take 10 mg by mouth every morning.     Historical Provider, MD  cloNIDine (CATAPRES) 0.1 MG tablet Take 0.1 mg by mouth 2 (two) times daily.    Historical Provider, MD  indomethacin (INDOCIN) 50 MG capsule Take 50 mg by mouth 2 (two) times daily as needed. For pain    Historical Provider, MD  labetalol (NORMODYNE) 200 MG tablet Take 400 mg by mouth 2 (two) times daily.  Historical Provider, MD  Multiple Vitamin (MULITIVITAMIN WITH MINERALS) TABS Take 1 tablet by mouth daily.    Historical Provider, MD  simvastatin (ZOCOR) 80 MG tablet Take 80 mg by mouth at bedtime.      Historical Provider, MD    Inpatient Medications:     . amLODipine  10 mg Oral q morning - 10a  . aspirin EC  81 mg Oral Daily  . atorvastatin  40 mg Oral q1800  . cloNIDine  0.2 mg Oral TID  . furosemide  40 mg Intravenous Once  . furosemide  40 mg Intravenous Once  . furosemide  80 mg Intravenous Q12H  . heparin  5,000 Units  Subcutaneous Q8H  . influenza  inactive virus vaccine  0.5 mL Intramuscular Once  . labetalol  400 mg Oral BID  . nitroGLYCERIN  5-200 mcg/min Intravenous Once  . sodium chloride  3 mL Intravenous Q12H  . DISCONTD: cloNIDine  0.1 mg Oral BID  . DISCONTD: furosemide  40 mg Intramuscular Once    Allergies: No Known Allergies  History   Social History  . Marital Status: Married    Spouse Name: N/A    Number of Children: N/A  . Years of Education: 10   Occupational History  .     Social History Main Topics  . Smoking status: Former Smoker -- 1.0 packs/day for 30 years    Types: Cigarettes  . Smokeless tobacco: Former Neurosurgeon    Quit date: 12/31/1993  . Alcohol Use: No  . Drug Use: No  . Sexually Active: Yes    Birth Control/ Protection: None   Other Topics Concern  . Not on file   Social History Narrative  . No narrative on file     Family History  Problem Relation Age of Onset  . Arthritis    . Cancer    . Kidney disease    . Anesthesia problems Neg Hx   . Hypotension Neg Hx   . Malignant hyperthermia Neg Hx   . Pseudochol deficiency Neg Hx      Review of Systems: Patient reports a recent "cold," no productive cough, fevers or chills. Reports stable appetite. No definite orthopnea until just recently. No syncope. No reported bleeding problems. Otherwise negative except as outlined.  Labs:  [facility] 06/23/12 0557 06/23/12 0230  CKTOTAL -- --  CKMB -- --  TROPONINI <0.30 <0.30   Lab Results  Component Value Date   WBC 9.7 06/23/2012   HGB 10.8* 06/23/2012   HCT 33.4* 06/23/2012   MCV 88.4 06/23/2012   PLT 250 06/23/2012     Lab 06/23/12 0230  NA 137  K 3.8  CL 103  CO2 25  BUN 37*  CREATININE 3.25*  CALCIUM 8.9  PROT --  BILITOT --  ALKPHOS --  ALT --  AST --  GLUCOSE 153*   Lab Results  Component Value Date   CHOL 164 11/01/2008   HDL 56 11/01/2008   LDLCALC 90 11/01/2008   TRIG 92 11/01/2008    Radiology/Studies:  Dg Chest 1 View 06/23/2012   *RADIOLOGY REPORT*  Clinical Data: Respiratory distress  CHEST - 1 VIEW  Comparison: None.  Findings: Heart size upper normal to mildly enlarged.  Mild mediastinal prominence.  Central vascular congestion/hilar prominence.  Perihilar and bibasilar opacities.  Small left pleural effusions suggested.  Associated opacity.  No definite pneumothorax.  No acute osseous finding.  IMPRESSION: Prominent cardiomediastinal contours with interstitial and airspace opacities.  This may reflect  pulmonary edema or infection in the appropriate clinical setting.  Small left pleural effusion suggested with associate consolidation, likely atelectasis.  Recommend radiographic follow-up after appropriate therapy to document resolution.   Original Report Authenticated By: Waneta Martins, M.D.    EKG: NSR 87bpm frequent PVCs, LVH TWI in avL, also possibly V5-V6 - baseline wander makes interpretation slightly difficult. (Prior EKG 12/2011 demonstrated NSR with T wave inersion in V4-V6).  Physical Exam: Blood pressure 143/80, pulse 77, temperature 97.8 F (36.6 C), temperature source Oral, resp. rate 15, height 5\' 9"  (1.753 m), weight 179 lb 10.8 oz (81.5 kg), SpO2 96.00%. Patient in no acute distress, breathing comfortably this morning.  HEENT: Conjunctiva and lids normal, oropharynx clear. Neck: Supple, normal JVP, no carotid bruits, no thyromegaly. Lungs: Clear to auscultation, few rhonchi, nonlabored breathing at rest. Cardiac: Distant, regular rate and rhythm, no S3 or significant systolic murmur, no pericardial rub. Abdomen: Soft, nontender, bowel sounds present, no guarding or rebound. Extremities: Trace ankle edema, distal pulses 1-2+. Skin: Warm and dry. Musculoskeletal: No kyphosis. Neuropsychiatric: Alert and oriented x3, affect grossly appropriate.   Assessment:  1. Presentation with progressive dyspnea culminating in respiratory distress requiring BiPAP in the setting of hypertensive urgency and pulmonary  edema. Patient has evidence of mild to moderate left ventricular dysfunction by echocardiogram, and therefore acute on possibly chronic systolic heart failure. Suspect that with his progressive renal disease at baseline, and poorly controlled hypertension, he has manifested systolic dysfunction over time. Cardiac markers argue against an acute coronary syndrome.  2. Hypertensive urgency, blood pressure trend is improving.  3. CKD, stage IV. Recent creatinine up to 3.2, GFR 19. Dr. Warnell Forester is following for nephrology evaluation.  4. Hyperlipidemia, on statin therapy as an outpatient.  Plan:  Would try to further optimize medical treatment. We probably do not need to pursue further ischemic workup at the present time, although this will need to be considered down the road. For now would continue aspirin, Lipitor, Lasix as written. Labetalol is not an unreasonable choice, although Coreg would be a consideration. He has been on Norvasc which will be continued. Plan to wean him off of nitroglycerin IV, and concurrently add nitrate plus hydralazine for further afterload reduction in blood pressure control. He may ultimately be able to come off of clonidine in that setting, and this will be down titrated. Also check TSH, followup on renal ultrasound.  Signed, Ronie Spies PA-C 06/23/2012, 10:46 AM  Attending note:  Patient seen and examined, reviewed records and modifed the above note with my findings and recommendations.  Jonelle Sidle, M.D., F.A.C.C.

## 2012-06-23 NOTE — H&P (Signed)
Randy [name] is an 78 y.o. male.    PCP: Isabella Stalling, MD   Chief Complaint: Shortness of breath  HPI: This is a 78 year old, African American male, with a past medical history of hypertension, gout, who was in his usual state of health about a week ago, when he started noticing that he was getting short of breath with exertion. Subsequently, his symptoms were present even at rest. He had some symptoms suggestive of orthopnea and PND. He denied cough. No fever or chills. No nausea, vomiting. Denied any chest pains. Denies any recent history of black stools or blood in the stools. The shortness of breath, cough, worse over the course of the night and he decided to call EMS and presented to the hospital. He was in acute respiratory distress. After receiving Lasix and being placed on the BiPAP. Patient is feeling better. There is no history of dizziness or lightheadedness.   Home Medications: Prior to Admission medications   Medication Sig Start Date End Date Taking? Authorizing Provider  allopurinol (ZYLOPRIM) 300 MG tablet Take 300 mg by mouth every morning.     Historical Provider, MD  amLODipine (NORVASC) 10 MG tablet Take 10 mg by mouth every morning.     Historical Provider, MD  cloNIDine (CATAPRES) 0.1 MG tablet Take 0.1 mg by mouth 2 (two) times daily.    Historical Provider, MD  indomethacin (INDOCIN) 50 MG capsule Take 50 mg by mouth 2 (two) times daily as needed. For pain    Historical Provider, MD  labetalol (NORMODYNE) 200 MG tablet Take 400 mg by mouth 2 (two) times daily.     Historical Provider, MD  Multiple Vitamin (MULITIVITAMIN WITH MINERALS) TABS Take 1 tablet by mouth daily.    Historical Provider, MD  simvastatin (ZOCOR) 80 MG tablet Take 80 mg by mouth at bedtime.      Historical Provider, MD    Allergies: No Known Allergies  Past Medical History: Past Medical History  Diagnosis Date  . Hypertension   . Hypercholesterolemia   . Anemia   . Chronic kidney  disease   . Gout     Past Surgical History  Procedure Date  . Knee surgery 2007    right knee  . Back surgery 2004  . Hemorrhoid surgery 40 years ago  . Ganglion cyst excision 01/03/2012    Procedure: REMOVAL GANGLION OF WRIST;  Surgeon: Marlane Hatcher, MD;  Location: [facility];  Service: General;  Laterality: Right;    Social History:  reports that he has quit smoking. His smoking use included Cigarettes. He has a 30 pack-year smoking history. He quit smokeless tobacco use about 18 years ago. He reports that he does not drink alcohol or use illicit drugs.  Family History:  Family History  Problem Relation Age of Onset  . Arthritis    . Cancer    . Kidney disease    . Anesthesia problems Neg Hx   . Hypotension Neg Hx   . Malignant hyperthermia Neg Hx   . Pseudochol deficiency Neg Hx     Review of Systems - unobtainable from patient due to respiratory difficulty/bipap  Physical Examination Blood pressure 166/93, pulse 61, temperature 98.3 F (36.8 C), temperature source Oral, resp. rate 12, height 5\' 9"  (1.753 m), weight 90.719 kg (200 lb), SpO2 100.00%.  General appearance: alert, cooperative, appears stated age and no distress Head: Normocephalic, without obvious abnormality, atraumatic Eyes: conjunctivae/corneas clear. PERRL, EOM's intact.  Neck: no adenopathy, no carotid  bruit, no JVD, supple, symmetrical, trachea midline and thyroid not enlarged, symmetric, no tenderness/mass/nodules Back: symmetric, no curvature. ROM normal. No CVA tenderness. Resp: He has crackles about one thirds of the lung fields from below. no wheezing. Cardio: regular rate and rhythm, S1, S2 normal, no murmur, click, rub or gallop GI: soft, non-tender; bowel sounds normal; no masses,  no organomegaly Extremities: 1+ pitting edema is present. Bilateral lower extremities. No erythema. No calf tenderness Pulses: 2+ and symmetric Skin: Skin color, texture, turgor normal. No rashes or lesions Lymph  nodes: Cervical, supraclavicular, and axillary nodes normal. Neurologic: Grossly normal  Laboratory Data: Results for orders placed during the hospital encounter of 06/23/12 (from the past 48 hour(s))  CBC WITH DIFFERENTIAL     Status: Abnormal   Collection Time   06/23/12  2:30 AM      Component Value Range Comment   WBC 9.7  4.0 - 10.5 K/uL    RBC 3.78 (*) 4.22 - 5.81 MIL/uL    Hemoglobin 10.8 (*) 13.0 - 17.0 g/dL    HCT 40.9 (*) 81.1 - 52.0 %    MCV 88.4  78.0 - 100.0 fL    MCH 28.6  26.0 - 34.0 pg    MCHC 32.3  30.0 - 36.0 g/dL    RDW 91.4 (*) 78.2 - 15.5 %    Platelets 250  150 - 400 K/uL    Neutrophils Relative 68  43 - 77 %    Neutro Abs 6.6  1.7 - 7.7 K/uL    Lymphocytes Relative 23  12 - 46 %    Lymphs Abs 2.2  0.7 - 4.0 K/uL    Monocytes Relative 7  3 - 12 %    Monocytes Absolute 0.6  0.1 - 1.0 K/uL    Eosinophils Relative 3  0 - 5 %    Eosinophils Absolute 0.3  0.0 - 0.7 K/uL    Basophils Relative 0  0 - 1 %    Basophils Absolute 0.0  0.0 - 0.1 K/uL   BASIC METABOLIC PANEL     Status: Abnormal   Collection Time   06/23/12  2:30 AM      Component Value Range Comment   Sodium 137  135 - 145 mEq/L    Potassium 3.8  3.5 - 5.1 mEq/L    Chloride 103  96 - 112 mEq/L    CO2 25  19 - 32 mEq/L    Glucose, Bld 153 (*) 70 - 99 mg/dL    BUN 37 (*) 6 - 23 mg/dL    Creatinine, Ser 9.56 (*) 0.50 - 1.35 mg/dL    Calcium 8.9  8.4 - 21.3 mg/dL    GFR calc non Af Amer 17 (*) >90 mL/min    GFR calc Af Amer 19 (*) >90 mL/min   TROPONIN I     Status: Normal   Collection Time   06/23/12  2:30 AM      Component Value Range Comment   Troponin I <0.30  <0.30 ng/mL   PRO B NATRIURETIC PEPTIDE     Status: Abnormal   Collection Time   06/23/12  2:30 AM      Component Value Range Comment   Pro B Natriuretic peptide (BNP) 8492.0 (*) 0 - 450 pg/mL   BLOOD GAS, ARTERIAL     Status: Abnormal   Collection Time   06/23/12  3:00 AM      Component Value Range Comment   FIO2 60.00  Delivery systems BILEVEL POSITIVE AIRWAY PRESSURE      Inspiratory PAP 16.0      Expiratory PAP 8.0      pH, Arterial 7.319 (*) 7.350 - 7.450    pCO2 arterial 42.8  35.0 - 45.0 mmHg    pO2, Arterial 206.0 (*) 80.0 - 100.0 mmHg    Bicarbonate 21.4  20.0 - 24.0 mEq/L    TCO2 19.8  0 - 100 mmol/L    Acid-Base Excess 3.7 (*) 0.0 - 2.0 mmol/L    O2 Saturation 99.3      Collection site RIGHT RADIAL      Drawn by [phone]      Sample type ARTERIAL      Allens test (pass/fail) PASS  PASS     Radiology Reports: Dg Chest 1 View  06/23/2012  *RADIOLOGY REPORT*  Clinical Data: Respiratory distress  CHEST - 1 VIEW  Comparison: None.  Findings: Heart size upper normal to mildly enlarged.  Mild mediastinal prominence.  Central vascular congestion/hilar prominence.  Perihilar and bibasilar opacities.  Small left pleural effusions suggested.  Associated opacity.  No definite pneumothorax.  No acute osseous finding.  IMPRESSION: Prominent cardiomediastinal contours with interstitial and airspace opacities.  This may reflect pulmonary edema or infection in the appropriate clinical setting.  Small left pleural effusion suggested with associate consolidation, likely atelectasis.  Recommend radiographic follow-up after appropriate therapy to document resolution.   Original Report Authenticated By: Waneta Martins, M.D.     Electrocardiogram: Sinus rhythm at 87 beats per minute. Normal axis. Frequent PVCs are noted. Intervals are normal. No definite Q waves. Nonspecific T-wave changes are noted. Findings suggestive of left ventricular hypertrophy is seen.  Assessment/Plan  Principal Problem:  *Pulmonary edema, acute Active Problems:  KIDNEY DISEASE, CHRONIC, STAGE III  POLYCYSTIC KIDNEY DISEASE  Accelerated hypertension  Acute respiratory failure  Anemia in chronic kidney disease   #1 acute pulmonary edema: This could be congestive heart failure. This could be related to his chronic kidney disease. We'll  give him intravenous Lasix. He also came in with a significantly elevated blood pressure and so, hypertensive heart disease is also in the differential. We will get an echocardiogram. Cardiology will be consulted. BiPAP will be continued. Strict ins and outs will be checked. Daily weights will be measured. Troponins will be checked as well.  #2 acute respiratory failure, secondary to pulmonary edema: Continue with BiPAP for now. Patient is stable.  #3 accelerated hypertension/malignant hypertension: Blood pressure is improved with the nitroglycerin infusion, which will be continued. His home medication regimen will be resumed.  #4 acute on chronic kidney disease: At baseline his creatinine is between 2.5 and 3. We will get a renal ultrasound. Nephrology consultation may be required.  #5 anemia, slightly thickened with chronic disease. Continue to monitor closely.  He is a full code.  He'll be admitted to the intensive care unit.  DVT, prophylaxis will be initiated.  Further management and decisions will depend on results of further testing and patient's response to treatment.   [facility]  [facility] Pager [phone]  06/23/2012, 4:19 AM

## 2012-06-23 NOTE — Progress Notes (Signed)
Ur chart review completed.  

## 2012-06-23 NOTE — Progress Notes (Signed)
[id number] 

## 2012-06-23 NOTE — ED Provider Notes (Addendum)
History     CSN: [id number]  Arrival date & time 06/23/12  0224   First MD Initiated Contact with Patient 06/23/12 0229      Chief Complaint  Patient presents with  . Respiratory Distress    (Consider location/radiation/quality/duration/timing/severity/associated sxs/prior treatment) HPI Level 5 Caveat: dyspnea. This is a 78 year old black male with a history of hypertension. He is here with shortness of breath that began about 8 PM yesterday evening. It acutely worsened this morning and he called EMS. He was initially conversant with EMS but worsened in route. His symptoms are moderate to severe at this time. He denies chest pain. He denies history of COPD, asthma or CHF. EMS administered albuterol neb and Solu-Medrol IV prior to arrival. He remains in distress.  Past Medical History  Diagnosis Date  . Hypertension   . Hypercholesterolemia     Past Surgical History  Procedure Date  . Knee surgery 2007    right knee  . Back surgery 2004  . Hemorrhoid surgery 40 years ago  . Ganglion cyst excision 01/03/2012    Procedure: REMOVAL GANGLION OF WRIST;  Surgeon: Marlane Hatcher, MD;  Location: [facility];  Service: General;  Laterality: Right;    Family History  Problem Relation Age of Onset  . Arthritis    . Cancer    . Kidney disease    . Anesthesia problems Neg Hx   . Hypotension Neg Hx   . Malignant hyperthermia Neg Hx   . Pseudochol deficiency Neg Hx     History  Substance Use Topics  . Smoking status: Former Smoker -- 1.0 packs/day for 30 years    Types: Cigarettes  . Smokeless tobacco: Former Neurosurgeon    Quit date: 12/31/1993  . Alcohol Use: No      Review of Systems  Unable to perform ROS   Allergies  Review of patient's allergies indicates no known allergies.  Home Medications   Current Outpatient Rx  Name Route Sig Dispense Refill  . ALLOPURINOL 300 MG PO TABS Oral Take 300 mg by mouth every morning.     Marland Kitchen AMLODIPINE BESYLATE 10 MG PO TABS Oral Take  10 mg by mouth every morning.     Marland Kitchen CLONIDINE HCL 0.1 MG PO TABS Oral Take 0.1 mg by mouth 2 (two) times daily.    . INDOMETHACIN 50 MG PO CAPS Oral Take 50 mg by mouth 2 (two) times daily as needed. For pain    . LABETALOL HCL 200 MG PO TABS Oral Take 400 mg by mouth 2 (two) times daily.     . ADULT MULTIVITAMIN W/MINERALS CH Oral Take 1 tablet by mouth daily.    Marland Kitchen SIMVASTATIN 80 MG PO TABS Oral Take 80 mg by mouth at bedtime.        BP 180/91  Pulse 64  Temp 98.3 F (36.8 C) (Oral)  Resp 15  Ht 5\' 9"  (1.753 m)  Wt 200 lb (90.719 kg)  BMI 29.53 kg/m2  SpO2 100%  Physical Exam General: Well-developed, well-nourished male in obvious respiratory distress; appearance consistent with age of record HENT: normocephalic, atraumatic Eyes: pupils equal round and reactive to light; extraocular muscles intact; arcus senilis bilaterally Neck: supple Heart: regular rate and rhythm; frequent ectopy Lungs: rales diffusely with decreased air movement bilaterally; accessory muscle use Abdomen: soft; nondistended; nontender Extremities: No deformity; full range of motion; trace edema of lower legs Neurologic: Awake, alert; motor function intact in all extremities and symmetric; no facial droop Skin:  Warm and dry Psychiatric: anxious    ED Course  Procedures (including critical care time)  CRITICAL CARE Performed by: Damari Hiltz L   Total critical care time: 30 minutes  Critical care time was exclusive of separately billable procedures and treating other patients.  Critical care was necessary to treat or prevent imminent or life-threatening deterioration.  Critical care was time spent personally by me on the following activities: development of treatment plan with patient and/or surrogate as well as nursing, discussions with consultants, evaluation of patient's response to treatment, examination of patient, obtaining history from patient or surrogate, ordering and performing treatments and  interventions, ordering and review of laboratory studies, ordering and review of radiographic studies, pulse oximetry and re-evaluation of patient's condition.    MDM   Nursing notes and vitals signs, including pulse oximetry, reviewed.  Summary of this visit's results, reviewed by myself:  Labs:  Results for orders placed during the hospital encounter of 06/23/12  CBC WITH DIFFERENTIAL      Component Value Range   WBC 9.7  4.0 - 10.5 K/uL   RBC 3.78 (*) 4.22 - 5.81 MIL/uL   Hemoglobin 10.8 (*) 13.0 - 17.0 g/dL   HCT 16.1 (*) 09.6 - 04.5 %   MCV 88.4  78.0 - 100.0 fL   MCH 28.6  26.0 - 34.0 pg   MCHC 32.3  30.0 - 36.0 g/dL   RDW 40.9 (*) 81.1 - 91.4 %   Platelets 250  150 - 400 K/uL   Neutrophils Relative 68  43 - 77 %   Neutro Abs 6.6  1.7 - 7.7 K/uL   Lymphocytes Relative 23  12 - 46 %   Lymphs Abs 2.2  0.7 - 4.0 K/uL   Monocytes Relative 7  3 - 12 %   Monocytes Absolute 0.6  0.1 - 1.0 K/uL   Eosinophils Relative 3  0 - 5 %   Eosinophils Absolute 0.3  0.0 - 0.7 K/uL   Basophils Relative 0  0 - 1 %   Basophils Absolute 0.0  0.0 - 0.1 K/uL  BASIC METABOLIC PANEL      Component Value Range   Sodium 137  135 - 145 mEq/L   Potassium 3.8  3.5 - 5.1 mEq/L   Chloride 103  96 - 112 mEq/L   CO2 25  19 - 32 mEq/L   Glucose, Bld 153 (*) 70 - 99 mg/dL   BUN 37 (*) 6 - 23 mg/dL   Creatinine, Ser 7.82 (*) 0.50 - 1.35 mg/dL   Calcium 8.9  8.4 - 95.6 mg/dL   GFR calc non Af Amer 17 (*) >90 mL/min   GFR calc Af Amer 19 (*) >90 mL/min  TROPONIN I      Component Value Range   Troponin I <0.30  <0.30 ng/mL  PRO B NATRIURETIC PEPTIDE      Component Value Range   Pro B Natriuretic peptide (BNP) 8492.0 (*) 0 - 450 pg/mL  BLOOD GAS, ARTERIAL      Component Value Range   FIO2 60.00     Delivery systems BILEVEL POSITIVE AIRWAY PRESSURE     Inspiratory PAP 16.0     Expiratory PAP 8.0     pH, Arterial 7.319 (*) 7.350 - 7.450   pCO2 arterial 42.8  35.0 - 45.0 mmHg   pO2, Arterial 206.0  (*) 80.0 - 100.0 mmHg   Bicarbonate 21.4  20.0 - 24.0 mEq/L   TCO2 19.8  0 - 100 mmol/L  Acid-Base Excess 3.7 (*) 0.0 - 2.0 mmol/L   O2 Saturation 99.3     Collection site RIGHT RADIAL     Drawn by [phone]     Sample type ARTERIAL     Allens test (pass/fail) PASS  PASS    Imaging Studies: Dg Chest 1 View  06/23/2012  *RADIOLOGY REPORT*  Clinical Data: Respiratory distress  CHEST - 1 VIEW  Comparison: None.  Findings: Heart size upper normal to mildly enlarged.  Mild mediastinal prominence.  Central vascular congestion/hilar prominence.  Perihilar and bibasilar opacities.  Small left pleural effusions suggested.  Associated opacity.  No definite pneumothorax.  No acute osseous finding.  IMPRESSION: Prominent cardiomediastinal contours with interstitial and airspace opacities.  This may reflect pulmonary edema or infection in the appropriate clinical setting.  Small left pleural effusion suggested with associate consolidation, likely atelectasis.  Recommend radiographic follow-up after appropriate therapy to document resolution.   Original Report Authenticated By: Waneta Martins, M.D.       EKG Interpretation:  Date & Time: 06/23/2012 2:28 AM  Rate: 87  Rhythm: normal sinus rhythm and premature ventricular contractions (PVC)  QRS Axis: normal  Intervals: normal  ST/T Wave abnormalities: T-wave inversions laterally  Conduction Disutrbances:none  Narrative Interpretation: left atrial hypertrophy, left ventricular hypertrophy  Old EKG Reviewed: no significant changes  3:39 AM Patient more comfortable on BiPAP. Minimal diuresis after Lasix 40 mg IV, will repeat.      Hanley Seamen, MD 06/23/12 6045  Hanley Seamen, MD 06/23/12 [phone]

## 2012-06-24 ENCOUNTER — Inpatient Hospital Stay (HOSPITAL_COMMUNITY): Payer: Medicare Other

## 2012-06-24 LAB — PHOSPHORUS
Phosphorus: 4.3 mg/dL (ref 2.3–4.6)
Phosphorus: 4.2 mg/dL (ref 2.3–4.6)

## 2012-06-24 LAB — TSH: TSH: 1.077 u[IU]/mL (ref 0.350–4.500)

## 2012-06-24 LAB — BASIC METABOLIC PANEL
Calcium: 8.8 mg/dL (ref 8.4–10.5)
Creatinine, Ser: 4.3 mg/dL — ABNORMAL HIGH (ref 0.50–1.35)
GFR calc Af Amer: 14 mL/min — ABNORMAL LOW (ref >90)

## 2012-06-24 LAB — HEPATIC FUNCTION PANEL
ALT: 10 U/L (ref 0–53)
Alkaline Phosphatase: 57 U/L (ref 39–117)
Bilirubin, Direct: <0.1 mg/dL (ref 0.0–0.3)
Total Bilirubin: 0.3 mg/dL (ref 0.3–1.2)

## 2012-06-24 LAB — IRON AND TIBC: UIBC: 109 ug/dL — ABNORMAL LOW (ref 125–400)

## 2012-06-24 LAB — PTH, INTACT AND CALCIUM
Calcium, Total (PTH): 8.8 mg/dL (ref 8.4–10.5)
PTH: 672.3 pg/mL — ABNORMAL HIGH (ref 14.0–72.0)

## 2012-06-24 MED ORDER — SODIUM CHLORIDE 0.9 % IV SOLN
250.0000 mL | INTRAVENOUS | Status: DC | PRN
  Administered 2012-06-24: 250 mL via INTRAVENOUS

## 2012-06-24 MED ORDER — FUROSEMIDE 10 MG/ML IJ SOLN: 20.0000 mg | Freq: Two times a day (BID) | INTRAMUSCULAR | Status: DC

## 2012-06-24 MED ORDER — OXYCODONE HCL 5 MG PO TABS
5.0000 mg | ORAL_TABLET | ORAL | Status: DC | PRN
  Administered 2012-06-24: 5 mg via ORAL
  Filled 2012-06-24: qty 1

## 2012-06-24 MED ORDER — POTASSIUM CHLORIDE 20 MEQ/15ML (10%) PO LIQD
20.0000 meq | Freq: Two times a day (BID) | ORAL | Status: DC
  Administered 2012-06-24: 20 meq via ORAL
  Filled 2012-06-24: qty 30

## 2012-06-24 MED ORDER — SODIUM CHLORIDE 0.9 % IJ SOLN
INTRAMUSCULAR | Status: AC
  Administered 2012-06-24: 3 mL
  Filled 2012-06-24: qty 3

## 2012-06-24 NOTE — Progress Notes (Signed)
Randy [name]  MRN: [id number]  DOB/AGE: 76-Aug-1933 76 y.o.  Primary Care Physician:DONDIEGO,RICHARD M, MD  Admit date: 06/23/2012  Chief Complaint:  Chief Complaint  Patient presents with  . Respiratory Distress    S-Pt presented on  06/23/2012 with  Chief Complaint  Patient presents with  . Respiratory Distress  .    Pt today feels better than before.    No new complaints .  Meds     . amLODipine  10 mg Oral q morning - 10a  . aspirin EC  81 mg Oral Daily  . atorvastatin  40 mg Oral q1800  . cloNIDine  0.1 mg Oral TID  . furosemide  20 mg Intravenous Q12H  . heparin  5,000 Units Subcutaneous Q8H  . hydrALAZINE  10 mg Oral Q6H  . isosorbide mononitrate  30 mg Oral Daily  . labetalol  400 mg Oral BID  . sodium chloride  3 mL Intravenous Q12H  . sodium chloride      . DISCONTD: furosemide  80 mg Intravenous Q12H     Physical Exam: Vital signs in last 24 hours: Temp:  [97.5 F (36.4 C)-98.1 F (36.7 C)] 97.7 F (36.5 C) (10/02 1100) Pulse Rate:  [58-83] 75  (10/02 1200) Resp:  [11-24] 16  (10/02 1200) BP: (119-171)/(67-95) 124/67 mmHg (10/02 1200) SpO2:  [94 %-100 %] 98 % (10/02 1200) Weight:  [178 lb 12.7 oz (81.1 kg)] 178 lb 12.7 oz (81.1 kg) (10/02 0500) Weight change: -21 lb 3.3 oz (-9.619 kg) Last BM Date: 06/22/12  Intake/Output from previous day: 10/01 0701 - 10/02 0700 In: 1199.9 [P.O.:1080; I.V.:111.9; IV Piggyback:8] Out: 2250 [Urine:2250] Total I/O In: 420 [P.O.:420] Out: 275 [Urine:275]   Physical Exam: General- pt is awake,alert, oriented to time place and person Resp- No acute REsp distress,Decreased BS at bases. CVS- S1S2 regular in rate and rhythm GIT- BS+, soft, NT, ND EXT-Trace  LE Edema, Cyanosis   Lab Results: CBC  Basename 06/23/12 0230  WBC 9.7  HGB 10.8*  HCT 33.4*  PLT 250    BMET  Basename 06/24/12 0421 06/23/12 0230  NA 137 137  K 3.4* 3.8  CL 102 103  CO2 23 25  GLUCOSE 127* 153*  BUN 52* 37*    CREATININE 4.30* 3.25*  CALCIUM 8.8 8.9   Creat 2013  2.74==>2.94==>3.25==>4.3 4098,1191- NO data 2010 1.84 2009 1.9 2008 1.9--2.0   MICRO Recent Results (from the past 240 hour(s))  MRSA PCR SCREENING     Status: Normal   Collection Time   06/23/12  5:20 AM      Component Value Range Status Comment   MRSA by PCR NEGATIVE  NEGATIVE Final       Lab Results  Component Value Date   CALCIUM 8.8 06/24/2012   PHOS 4.3 06/24/2012      Impression: 1)Renal  AKI on CKD Progression               AKI secondary to Cardiorenal/ ATN               CKD stage 4.               CKD since 2008( Most Likley before that)               CKD secondary to HTN/ Age related factors                Progression of CKD slow -though now rapid with Malignnat HTN  2)HTN  Now much better controlled Target Organ damage  CKD  Medication-  On Diuretics- On Calcium Channel Blockers On Alpha and beta Blockers  On Vasodilators-  On Central Acting Sympatholytics-    3)Anemia HGb at goal (9--11)   4)CKD Mineral-Bone Disorder PTH not avail . Secondary Hyperparathyroidism w/u pending. Phosphorus at goal. Vitamin 25-OH will check.  5)Gout   6)FEN  Hypokalemic NOrmonatremic  7)Acid base Co2 at goal     Plan:  Will initiate CKD-BMD work up Agree with IVF Will replace k NO need of Renal relpacement therapy at this time Educated pt about need if Renal replacement if GFr continues to worsen       Yoko Mcgahee S 06/24/2012, 2:14 PM

## 2012-06-24 NOTE — Progress Notes (Signed)
Upon transfer pt stating his hand pain is now a 10 on a 0-10 scale and that the Tylenol did not help.  Called Dr Janna Arch on his cell phone to notify.  Orders given.

## 2012-06-24 NOTE — Progress Notes (Addendum)
Subjective:  Breathing back to normal  Objective:  Vital Signs in the last 24 hours: Temp:  [97.7 F (36.5 C)-98.2 F (36.8 C)] 98.1 F (36.7 C) (10/02 0400) Pulse Rate:  [59-83] 63  (10/02 0545) Resp:  [11-21] 13  (10/02 0545) BP: (117-184)/(68-100) 157/86 mmHg (10/02 0545) SpO2:  [94 %-100 %] 98 % (10/02 0545) Weight:  [178 lb 12.7 oz (81.1 kg)] 178 lb 12.7 oz (81.1 kg) (10/02 0500)  Intake/Output from previous day: 10/01 0701 - 10/02 0700 In: 1199.9 [P.O.:1080; I.V.:111.9; IV Piggyback:8] Out: 2250 [Urine:2250] Intake/Output since admission: -1 L Weight: Decreased 9 kg during the first 3 hours of treatment, likely representing an error  Physical Exam: NECK:Increased JVD & HJR, no  bruit LUNGS: Decreased breath sounds at the left lung base with crackles on the right HEART: Regular rate and rhythm, no murmur, gallop, rub, bruit, thrill, or heave EXTREMITIES: Without cyanosis, clubbing, or edema  Basename 06/23/12 0230  WBC 9.7  HGB 10.8*  PLT 250    Basename 06/24/12 0421 06/23/12 0230  NA 137 137  K 3.4* 3.8  CL 102 103  CO2 23 25  GLUCOSE 127* 153*  BUN 52* 37*  CREATININE 4.30* 3.25*   Imaging: CXR 06/23/12 IMPRESSION: Interstitial and airspace opacities-pulmonary edema versus pneumonia; small left effusion; atelectasis   Cardiac Studies: Echocardiogram done earlier today shows mild LVH, mild to moderate LV dysfunction with LVEF 40-45%, largely global hypokinesis although more prominent in the inferoposterior wall. Grade 1 diastolic dysfunction. No major valvular abnormalities, PA pressure approximately 31 mm mercury.  Assessment/Plan:  Acute Pulmonary Edema secondary to Hypertensive crisis EF 40-45%.No history of chest pain and cardiac enzymes negative.May need ischemic workup later, but limited with CKD. Follow up CXR in AM Malignant Hypertension on amlodipine 10 mg daily, clonidine 0.1 mg t.i.d., Lasix 80 mg IV q.12, hydralazine 10 mg q.6, Imdur were 30 mg  daily labetalol 400 mg b.i.d. BP currently 147/93 3. CKD, stage IV. Recent creatinine up to 4.3 GFR 19. Dr. Warnell Forester is following for nephrology evaluation. 4. Hyperlipidemia, on statin therapy as an outpatient.  Jacolyn Reedy 06/24/2012, 8:14 AM  Cardiology Attending Patient interviewed and examined. Discussed with Jacolyn Reedy, PA-C.  Above note annotated and modified based upon my findings.  Renal function deteriorating-hold diuretics.  Zachary Bing, MD 06/24/2012, 5:49 PM

## 2012-06-24 NOTE — Progress Notes (Signed)
NAMEARGENIS, KUMARI NO.:  [id number]  MEDICAL RECORD NO.:  [id number]  LOCATION:  [facility]01                          FACILITY:  [facility]  PHYSICIAN:  Melvyn Novas, MDDATE OF BIRTH:  Apr 23, 1932  DATE OF PROCEDURE: DATE OF DISCHARGE:                                PROGRESS NOTE   [name] hemodynamics are much improved.  Blood pressure 120/73 sinus rhythm, 60 beats per minute, controlled currently on Norvasc 10, clonidine 0.1 t.i.d., hydralazine 10 p.o. q.6 h., Imdur 30 mg daily, labetalol 40 mg p.o. b.i.d.  Creatinine did bump with Lasix from 3 to 4.3 today, potassium 3.4.  Two-D echocardiogram reveals an ejection fraction of 45%, with an ischemic cardiomyopathy, regional wall motion abnormalities noted.  Lungs are clear.  No rales, wheeze, or rhonchi appreciable today.  Heart, regular rate and rhythm.  No S3, S4 appreciable.  Abdomen is soft, nontender.  Bowel sounds are normoactive.  The plan right now is to continue current therapy.  I am concerned that he may be a little over diuresed and this is adversely affecting renal perfusion.  Plan is to give normal saline 75 mL an hour for adequate preload and renal perfusion.  Monitor renal function daily as well as electrolytes.     Melvyn Novas, MD     RMD/MEDQ  D:  06/24/2012  T:  06/24/2012  Job:  [id number]

## 2012-06-24 NOTE — Care Management Note (Signed)
    Page 1 of 1   06/26/2012     2:54:30 PM   CARE MANAGEMENT NOTE 06/26/2012  Patient:  Randy [name], Randy [name]   Account Number:  [id number]  Date Initiated:  06/24/2012  Documentation initiated by:  Sharrie Rothman  Subjective/Objective Assessment:   Pt admitted from home with pulmonary edema. Pt lives with wife and will return home at discharge. Pt is independent with ADL's.     Action/Plan:   No CM or HH needs noted.   Anticipated DC Date:  06/26/2012   Anticipated DC Plan:  HOME/SELF CARE      DC Planning Services  CM consult      Choice offered to / List presented to:             Status of service:  Completed, signed off Medicare Important Message given?  YES (If response is "NO", the following Medicare IM given date fields will be blank) Date Medicare IM given:  06/26/2012 Date Additional Medicare IM given:    Discharge Disposition:    Per UR Regulation:    If discussed at Long Length of Stay Meetings, dates discussed:    Comments:  06/26/12 1453 Arlyss Queen, RN BSN CM Pt possible discharge over the weekend. No CM or HH needs noted.  06/24/12 1428 Arlyss Queen, RN BSN CM

## 2012-06-24 NOTE — Progress Notes (Signed)
Report called and given to Derrick Ravel, RN. Patient being transported to room 324. Patient alert, oriented and in stable condition.

## 2012-06-24 NOTE — Progress Notes (Signed)
[id number]  

## 2012-06-24 NOTE — Progress Notes (Signed)
Called into room by patient. Patient complains of pain in R wrist area. States it is nerve pain from having cyst removed. Patient given tylenol (takes Alieve at home). MD notified and he will look at it when he see's patient again.

## 2012-06-24 NOTE — Progress Notes (Signed)
Patient is currently not in need of a CPAP device at this time;will continue to monitor patient for any changes in his status.

## 2012-06-25 LAB — HEPATIC FUNCTION PANEL
ALT: 11 U/L (ref 0–53)
Albumin: 2.5 g/dL — ABNORMAL LOW (ref 3.5–5.2)
Alkaline Phosphatase: 57 U/L (ref 39–117)
Total Protein: 5.2 g/dL — ABNORMAL LOW (ref 6.0–8.3)

## 2012-06-25 LAB — BASIC METABOLIC PANEL
CO2: 24 mEq/L (ref 19–32)
Calcium: 8.7 mg/dL (ref 8.4–10.5)
Creatinine, Ser: 4.38 mg/dL — ABNORMAL HIGH (ref 0.50–1.35)
Glucose, Bld: 106 mg/dL — ABNORMAL HIGH (ref 70–99)

## 2012-06-25 LAB — PTH, INTACT AND CALCIUM
Calcium, Total (PTH): 8.1 mg/dL — ABNORMAL LOW (ref 8.4–10.5)
PTH: 559 pg/mL — ABNORMAL HIGH (ref 14.0–72.0)

## 2012-06-25 LAB — VITAMIN D 25 HYDROXY (VIT D DEFICIENCY, FRACTURES): Vit D, 25-Hydroxy: 19 ng/mL — ABNORMAL LOW (ref 30–89)

## 2012-06-25 MED ORDER — CALCITRIOL 0.25 MCG PO CAPS
0.5000 ug | ORAL_CAPSULE | Freq: Every day | ORAL | Status: DC
  Administered 2012-06-25 – 2012-06-26 (×2): 0.5 ug via ORAL
  Filled 2012-06-25 (×2): qty 1
  Filled 2012-06-25: qty 2

## 2012-06-25 MED ORDER — HYDRALAZINE HCL 25 MG PO TABS: 50.0000 mg | ORAL_TABLET | Freq: Three times a day (TID) | ORAL | Status: DC

## 2012-06-25 MED ORDER — HYDRALAZINE HCL 25 MG PO TABS
25.0000 mg | ORAL_TABLET | Freq: Three times a day (TID) | ORAL | Status: DC
  Administered 2012-06-25 – 2012-06-26 (×4): 25 mg via ORAL
  Filled 2012-06-25 (×4): qty 1

## 2012-06-25 NOTE — Progress Notes (Signed)
Randy [name], Randy [name] NO.:  [id number]  MEDICAL RECORD NO.:  [id number]  LOCATION:  A324                          FACILITY:  [facility]  PHYSICIAN:  Melvyn Novas, MDDATE OF BIRTH:  Dec 18, 1931  DATE OF PROCEDURE: DATE OF DISCHARGE:                                PROGRESS NOTE   The patient came in with pulmonary edema, accelerated hypertension, ischemic cardiomyopathy, ejection fraction 40-45%, hyperlipidemia, acute on chronic renal failure.  The patient was given diuresis, afterload reduction, responded well to pulmonary edema where his creatinine shot up from 2.38 to 4.3, now 4.3, was given normal saline, 12 hours ago to hopefully improve renal perfusion pressures, seen in consultation by Cardiology as well as Nephrology.  BUN 52, creatinine 4.38, hemoglobin 10.8, blood pressure 153/69, temperature 98.1, pulse 68 and regular, and respiratory rate is 19.  The patient has significant pain in right palmar aspect of hand consistent with carpal tunnel syndrome.  He does have associated paresthesias of the middle digits.  I told him we cannot give him NSAIDs.  He is on oxycodone for this.  The plan right now is to continue to monitor renal function, continue current therapy.     Melvyn Novas, MD     RMD/MEDQ  D:  06/25/2012  T:  06/25/2012  Job:  [id number]

## 2012-06-25 NOTE — Progress Notes (Signed)
[id number] 

## 2012-06-25 NOTE — Progress Notes (Signed)
Subjective: Interval History: has no complaint of nausea or vomiting. He does have also any difficulty breathing. Overall patient says that he's getting better..  Objective: Vital signs in last 24 hours: Temp:  [98.1 F (36.7 C)-98.3 F (36.8 C)] 98.1 F (36.7 C) (10/03 0529) Pulse Rate:  [66-75] 68  (10/03 0529) Resp:  [16-18] 18  (10/03 0529) BP: (124-153)/(67-78) 153/69 mmHg (10/03 0529) SpO2:  [98 %-100 %] 100 % (10/02 2056) Weight:  [81.3 kg (179 lb 3.7 oz)] 81.3 kg (179 lb 3.7 oz) (10/03 0455) Weight change: 0.2 kg (7.1 oz)  Intake/Output from previous day: 10/02 0701 - 10/03 0700 In: 1881.3 [P.O.:780; I.V.:1101.3] Out: 875 [Urine:875] Intake/Output this shift: Total I/O In: 240 [P.O.:240] Out: 300 [Urine:300]  General appearance: alert, cooperative and no distress Resp: clear to auscultation bilaterally Cardio: regular rate and rhythm, S1, S2 normal, no murmur, click, rub or gallop GI: soft, non-tender; bowel sounds normal; no masses,  no organomegaly Extremities: extremities normal, atraumatic, no cyanosis or edema  Lab Results:  Basename 06/23/12 0230  WBC 9.7  HGB 10.8*  HCT 33.4*  PLT 250   BMET:  Basename 06/25/12 0524 06/24/12 0421  NA 138 137  K 3.8 3.4*  CL 104 102  CO2 24 23  GLUCOSE 106* 127*  BUN 52* 52*  CREATININE 4.38* 4.30*  CALCIUM 8.7 8.8    Basename 06/23/12 1111  PTH 672.3*   Iron Studies:  Basename 06/24/12 0421  IRON 62  TIBC 171*  TRANSFERRIN --  FERRITIN 193    Studies/Results: Dg Chest 2 View  06/24/2012  *RADIOLOGY REPORT*  Clinical Data: Pulmonary edema  CHEST - 2 VIEW  Comparison: Chest radiograph 06/23/2012  Findings: Heart size appears within normal limits with two-view technique.  Pulmonary vascular congestion has significantly decreased/resolved since yesterday's chest radiograph.  Aeration of both lungs is significantly improved.  There are small bilateral posteriorly layering pleural effusions.  No focal airspace  disease is identified.  There is slight prominence right hilum, favored to be due to prominence of the central pulmonary arteries, however no prior chest radiographs earlier than yesterday, are available to assess for chronicity.  IMPRESSION:  1.  Significant improvement aeration of the lungs compared to 06/23/2012 suggests resolving pulmonary edema. 2.  Small bilateral pleural effusions. 3.  Right hilar prominence is favored to be due to slightly prominent central pulmonary vascularity.  Lymphadenopathy is felt to be less likely, but not excluded.   Original Report Authenticated By: Britta Mccreedy, M.D.     I have reviewed the patient's current medications.  Assessment/Plan: Problem #1 renal failure stage IV his BUN is 52 and creatinine is 4.38 still her function is not stable. Presently patient doesn't have any uremic sinus symptoms. Problem #2 history of hypertension his blood pressure seems to be doing better. Presently her name and also level is pending. Problem #3 history of bilateral multiple cysts Problem #4 history of anemia most likely a compression of iron deficiency and anemia of chronic disease. Problem #5 restless leg syndrome  Problem #6 history of adrenal mass. Problem #7 history of osteoarthritis. Problem #8 flash pulmonary edema that has improved. Problem #9 CK D-MBD his phosphorus and calcium is normal however PTH is high. Plan: We'll start him on Rocaltrol 0.5 mcg once a day. We'll continue his other medications as before.    LOS: 2 days   Randy [name] S 06/25/2012,11:33 AM

## 2012-06-25 NOTE — Progress Notes (Signed)
Patient: Randy [name] Date of Encounter: 06/25/2012, 9:14 AM Admit date: 06/23/2012     Subjective  "I feel good." No CP, SOB, orthopnea, LEE.   Objective   Telemetry: NSR occ PVCs Physical Exam: Filed Vitals:   06/25/12 0529  BP: 153/69  Pulse: 68  Temp: 98.1 F (36.7 C)  Resp: 18   General: Well developed, well nourished AAM in no acute distress. Head: Normocephalic, atraumatic, sclera non-icteric, no xanthomas, nares are without discharge.  Neck: Negative for carotid bruits. JVD moderately elevated. Lungs: Clear bilaterally to auscultation without wheezes, rales, or rhonchi. Breathing is unlabored. Heart: RRR S1 S2 without murmurs, rubs, or gallops.  Abdomen: Soft, non-tender, non-distended with normoactive bowel sounds. No hepatomegaly. No rebound/guarding. No obvious abdominal masses. Msk:  Strength and tone appear normal for age. Extremities: No clubbing or cyanosis. No edema.  Distal pedal pulses are 2+ and equal bilaterally. Neuro: Alert and oriented X 3. Moves all extremities spontaneously. Psych:  Responds to questions appropriately with a normal affect.   Intake/Output Summary (Last 24 hours) at 06/25/12 0914 Last data filed at 06/25/12 0804  Gross per 24 hour  Intake 1461.25 ml  Output    900 ml  Net 561.25 ml   Inpatient Medications:    . amLODipine  10 mg Oral q morning - 10a  . aspirin EC  81 mg Oral Daily  . atorvastatin  40 mg Oral q1800  . cloNIDine  0.1 mg Oral TID  . heparin  5,000 Units Subcutaneous Q8H  . hydrALAZINE  10 mg Oral Q6H  . labetalol  400 mg Oral BID  . sodium chloride  3 mL Intravenous Q12H  . sodium chloride      . DISCONTD: furosemide  20 mg Intravenous Q12H  . DISCONTD: furosemide  80 mg Intravenous Q12H  . DISCONTD: isosorbide mononitrate  30 mg Oral Daily  . DISCONTD: potassium chloride  20 mEq Oral BID   Labs:  Basename 06/25/12 0524 06/24/12 1634 06/24/12 0421 06/23/12 0557  NA 138 -- 137 --  K 3.8 -- 3.4* --  CL  104 -- 102 --  CO2 24 -- 23 --  GLUCOSE 106* -- 127* --  BUN 52* -- 52* --  CREATININE 4.38* -- 4.30* --  CALCIUM 8.7 -- 8.8 --  MG -- -- -- 2.0  PHOS -- 4.2 4.3 --    Basename 06/25/12 0524 06/24/12 0421  AST 13 16  ALT 11 10  ALKPHOS 57 57  BILITOT 0.2* 0.3  PROT 5.2* 5.5*  ALBUMIN 2.5* 2.8*    Basename 06/23/12 0230  WBC 9.7  NEUTROABS 6.6  HGB 10.8*  HCT 33.4*  MCV 88.4  PLT 250    Basename 06/23/12 1642 06/23/12 1111 06/23/12 0557 06/23/12 0230  CKTOTAL -- -- -- --  CKMB -- -- -- --  TROPONINI <0.30 <0.30 <0.30 <0.30    Basename 06/23/12 1111  HGBA1C 6.2*    Basename 06/24/12 0421  TSH 1.077  T4TOTAL --  T3FREE --  THYROIDAB --    Basename 06/24/12 0421  VITAMINB12 --  FOLATE --  FERRITIN 193  TIBC 171*  IRON 62  RETICCTPCT --   Radiology/Studies:   Dg Chest 06/24/2012: Improved vascular congestion; small bilateral effusions; no edema   Renal ultrasound: 1.  Negative for hydronephrosis. 2.  Bilateral simple appearing renal cysts, which were present on the prior study.   Original Report Authenticated By: Osa Craver, M.D.      Assessment  and Plan  1. Acute pulmonary edema secondary to hypertensive crisis in setting of renal insufficiency 2. LV dysfunction with EF 40-45% & largely global hypokinesis although more prominent in the inferoposterior wall. 3. Hypertensive urgency 4. CKD stage IV 5. Hyperlipidemia, on statin therapy  Nephrology is on board and has been largely driving workup including fluid balance and lytes. CE's negative and no history of chest pain, but given his LV dysfunction, he may require ischemic workup. He describes an intermittent dyspnea prior to admission but this has resolved.  BP meds include amlodipine 10mg , clonidine 0.1mg  TID, hydralazine 10mg  QID, labetalol 400mg  BID. Imdur DC'd yesterday by Dr. Dietrich Pates. ACEI/ARB is being held given acute on chronic renal failure for now. Can consider altering hydralazine to  TID dosing instead to encourage patient compliance. Continue ASA/statin. Dr. Dietrich Pates to follow.  Maryruth Hancock  Cardiology Attending Patient interviewed and examined. Discussed with Dayna Dunn PA-C.  Above note annotated and modified based upon my findings.  Renal function appears to be plateauing with discontinuation of diuretics. CHF is compensated.  I&O is now about balanced. Current antihypertensive regime includes drugs which are easily utilized and advanced kidney disease. Hydralazine will be changed to 25 mg 3 times a day.  Fairwood Bing, MD 06/25/2012, 6:23 PM

## 2012-06-25 NOTE — Progress Notes (Signed)
Pt not in need of a BIPAP/CPAP at this time

## 2012-06-26 DIAGNOSIS — I10 Essential (primary) hypertension: Secondary | ICD-10-CM

## 2012-06-26 DIAGNOSIS — I429 Cardiomyopathy, unspecified: Secondary | ICD-10-CM

## 2012-06-26 LAB — CBC
MCH: 29.3 pg (ref 26.0–34.0)
Platelets: 252 10*3/uL (ref 150–400)
RBC: 3.17 MIL/uL — ABNORMAL LOW (ref 4.22–5.81)
WBC: 6.6 10*3/uL (ref 4.0–10.5)

## 2012-06-26 LAB — HEPATIC FUNCTION PANEL
Alkaline Phosphatase: 62 U/L (ref 39–117)
Bilirubin, Direct: <0.1 mg/dL (ref 0.0–0.3)
Total Bilirubin: 0.2 mg/dL — ABNORMAL LOW (ref 0.3–1.2)

## 2012-06-26 LAB — MAGNESIUM: Magnesium: 2 mg/dL (ref 1.5–2.5)

## 2012-06-26 LAB — BASIC METABOLIC PANEL
Calcium: 8.8 mg/dL (ref 8.4–10.5)
GFR calc non Af Amer: 13 mL/min — ABNORMAL LOW (ref >90)
Sodium: 138 mEq/L (ref 135–145)

## 2012-06-26 MED ORDER — VITAMIN D (ERGOCALCIFEROL) 1.25 MG (50000 UNIT) PO CAPS
50000.0000 [IU] | ORAL_CAPSULE | ORAL | Status: DC
  Administered 2012-06-26: 50000 [IU] via ORAL
  Filled 2012-06-26: qty 1

## 2012-06-26 MED ORDER — EPOETIN ALFA 10000 UNIT/ML IJ SOLN
6000.0000 [IU] | INTRAMUSCULAR | Status: DC
  Administered 2012-06-26: 6000 [IU] via SUBCUTANEOUS
  Filled 2012-06-26: qty 1

## 2012-06-26 MED ORDER — DARBEPOETIN ALFA-POLYSORBATE 25 MCG/0.42ML IJ SOLN: 25.0000 ug | INTRAMUSCULAR | Status: DC

## 2012-06-26 MED ORDER — CLONIDINE HCL 0.2 MG PO TABS
0.2000 mg | ORAL_TABLET | Freq: Three times a day (TID) | ORAL | Status: DC
  Administered 2012-06-26 (×3): 0.2 mg via ORAL
  Filled 2012-06-26 (×3): qty 1

## 2012-06-26 NOTE — Progress Notes (Signed)
NAMESAYID, MOLL NO.:  [id number]  MEDICAL RECORD NO.:  [id number]  LOCATION:  A324                          FACILITY:  [facility]  PHYSICIAN:  Melvyn Novas, MDDATE OF BIRTH:  07/20/1932  DATE OF PROCEDURE: DATE OF DISCHARGE:                                PROGRESS NOTE   PROBLEMS: 1. Ischemic cardiomyopathy, ejection 45%. 2. Accelerated hypertension currently being brought under control. 3. Acute on chronic renal failure.  Creatinine now slightly improved     to 4.08 from 4.38. 4. Anemia presumably due to chronic renal failure.  PHYSICAL EXAMINATION:  VITAL SIGNS:  Blood pressure 160/75 today, temperature 98.3 pulse 80 and regular, respiratory rate is 20. Hemoglobin 9.3.  Creatinine 4.08. LUNGS:  Clear to A and P.  Prolonged diminished breath sounds at the bases.  No rales, wheeze, or rhonchi. HEART:  Regular rhythm.  No murmurs, gallops, heaves, thrills, or rubs. ABDOMEN:  Soft, nontender.  Bowel sounds normoactive.  The plan right now is to continue clonidine 0.2 t.i.d., Norvasc 10 p.o. daily, hydralazine increased to 25 p.o. t.i.d., labetalol 400 p.o. b.i.d.  Monitor hemodynamics, and continue lovastatin and aspirin. Continue calcitriol and ergocalciferol and erythropoietin.  As per Nephrology, still waiting aldosterone and renin levels to assess ratio. We will BMET in a.m.     Melvyn Novas, MD     RMD/MEDQ  D:  06/26/2012  T:  06/26/2012  Job:  [id number]

## 2012-06-26 NOTE — Progress Notes (Signed)
[id number] 

## 2012-06-26 NOTE — Progress Notes (Signed)
Subjective: Interval History: has no complaint of difficulty in breathing. His appetite is good he doesn't have any nausea or vomiting..  Objective: Vital signs in last 24 hours: Temp:  [97.5 F (36.4 C)-98.4 F (36.9 C)] 98.3 F (36.8 C) (10/04 0553) Pulse Rate:  [66-80] 80  (10/04 0800) Resp:  [16-20] 20  (10/04 0553) BP: (124-160)/(64-75) 160/75 mmHg (10/04 0553) SpO2:  [99 %-100 %] 100 % (10/04 0800) Weight:  [81.5 kg (179 lb 10.8 oz)] 81.5 kg (179 lb 10.8 oz) (10/04 0500) Weight change: 0.2 kg (7.1 oz)  Intake/Output from previous day: 10/03 0701 - 10/04 0700 In: 720 [P.O.:720] Out: 1500 [Urine:1500] Intake/Output this shift:    General appearance: alert, cooperative and no distress Resp: clear to auscultation bilaterally Cardio: regular rate and rhythm, S1, S2 normal, no murmur, click, rub or gallop GI: soft, non-tender; bowel sounds normal; no masses,  no organomegaly Extremities: extremities normal, atraumatic, no cyanosis or edema  Lab Results:  [facility] 06/26/12 0524  WBC 6.6  HGB 9.3*  HCT 27.8*  PLT 252   BMET:  Basename 06/26/12 0524 06/25/12 0524  NA 138 138  K 4.1 3.8  CL 105 104  CO2 24 24  GLUCOSE 106* 106*  BUN 55* 52*  CREATININE 4.08* 4.38*  CALCIUM 8.8 8.7    Basename 06/24/12 1634 06/23/12 1111  PTH 559.0* 672.3*   Iron Studies:  Basename 06/24/12 0421  IRON 62  TIBC 171*  TRANSFERRIN --  FERRITIN 193    Studies/Results: Dg Chest 2 View  06/24/2012  *RADIOLOGY REPORT*  Clinical Data: Pulmonary edema  CHEST - 2 VIEW  Comparison: Chest radiograph 06/23/2012  Findings: Heart size appears within normal limits with two-view technique.  Pulmonary vascular congestion has significantly decreased/resolved since yesterday's chest radiograph.  Aeration of both lungs is significantly improved.  There are small bilateral posteriorly layering pleural effusions.  No focal airspace disease is identified.  There is slight prominence right hilum,  favored to be due to prominence of the central pulmonary arteries, however no prior chest radiographs earlier than yesterday, are available to assess for chronicity.  IMPRESSION:  1.  Significant improvement aeration of the lungs compared to 06/23/2012 suggests resolving pulmonary edema. 2.  Small bilateral pleural effusions. 3.  Right hilar prominence is favored to be due to slightly prominent central pulmonary vascularity.  Lymphadenopathy is felt to be less likely, but not excluded.   Original Report Authenticated By: Britta Mccreedy, M.D.     I have reviewed the patient's current medications.  Assessment/Plan: Problem #1 renal failure at this moment seems to be acute on chronic. His BUN is 55 creatinine is 4.08 renal function seems to be improving. Patient has this moment does not have any uremic sinus symptoms. Problem #2 history of hypertension his blood pressure seems to control very well Problem #3 history of anemia most likely secondary to chronic renal failure. Problem #4 metabolic bone disease his vitamin D level is 19 which is low, PTH 559 high wheeze normal phosphorus and calcium. Problem #5 history of peripheral vascular disease Problem #6 history of restless leg syndrome Problem #7 history of flash pulmonary edema most likely associated with this his blood pressure Plan: We'll start patient on Rocaltrol 0.5 mcg by mouth once a day Will start patient on Ergo calciferol 50,000 international units po once a week. Will start patient on Epogen 6000 units subcutaneous every other week. We'll continue his other medications and follow patient as an outpatient if his discharge. If  the aldosterone to renin  level is high then we'll 24-hour urine collection for aldosterone.. If that is elevated then we'll discuss with the patient if  he wants to pursue surgery of the adrenal mass . If note patient at  end may benefit from Aldactone.    LOS: 3 days   Arrow Tomko S 06/26/2012,8:19  AM

## 2012-06-26 NOTE — Progress Notes (Signed)
UR Chart Review Completed  

## 2012-06-26 NOTE — Progress Notes (Signed)
Patient: Randy [name] Date of Encounter: 06/26/2012, 8:52 AM Admit date: 06/23/2012     Subjective  No CP or SOB. Feels well and back to baseline. No complaints.    Objective   Telemetry: NSR, occ PVCs, 4 beat NSVT Physical Exam: Filed Vitals:   06/26/12 0800  BP: 160/75  Pulse: 80  Temp: 98.3  Resp: 20  General: Well developed, well nourished AAM in no acute distress.  Head: Normocephalic, atraumatic, sclera non-icteric, no xanthomas, nares are without discharge.  Neck: Negative for carotid bruits. JVD mildly elevated.  Lungs: Slightly rhonchorous at bases improved with coughing. Otherwise no wheezes or rales. Breathing is unlabored.  Heart: RRR S1 S2 without murmurs, rubs, or gallops.  Abdomen: Soft, non-tender, non-distended with normoactive bowel sounds. No hepatomegaly. No rebound/guarding. No obvious abdominal masses.  Msk: Strength and tone appear normal for age.  Extremities: No clubbing or cyanosis. No edema. Distal pedal pulses are 2+ and equal bilaterally.  Neuro: Alert and oriented X 3. Moves all extremities spontaneously.  Psych: Responds to questions appropriately with a normal affect.      Intake/Output Summary (Last 24 hours) at 06/26/12 0852 Last data filed at 06/26/12 1027  Gross per 24 hour  Intake    480 ml  Output   1200 ml  Net   -720 ml    Inpatient Medications:    . amLODipine  10 mg Oral q morning - 10a  . aspirin EC  81 mg Oral Daily  . atorvastatin  40 mg Oral q1800  . calcitRIOL  0.5 mcg Oral Daily  . cloNIDine  0.2 mg Oral TID  . epoetin (EPOGEN/PROCRIT) injection  6,000 Units Subcutaneous Q14 Days  . heparin  5,000 Units Subcutaneous Q8H  . hydrALAZINE  25 mg Oral TID  . labetalol  400 mg Oral BID  . sodium chloride  3 mL Intravenous Q12H  . Vitamin D (Ergocalciferol)  50,000 Units Oral Q7 days  . DISCONTD: cloNIDine  0.1 mg Oral TID  . DISCONTD: darbepoetin  25 mcg Subcutaneous Q7 days  . DISCONTD: hydrALAZINE  10 mg Oral Q6H  .  DISCONTD: hydrALAZINE  50 mg Oral TID    Labs:  Basename 06/26/12 0524 06/25/12 0524 06/24/12 1634 06/24/12 0421  NA 138 138 -- --  K 4.1 3.8 -- --  CL 105 104 -- --  CO2 24 24 -- --  GLUCOSE 106* 106* -- --  BUN 55* 52* -- --  CREATININE 4.08* 4.38* -- --  CALCIUM 8.8 8.7 -- --  MG -- -- -- --  PHOS -- -- 4.2 4.3    Basename 06/26/12 0524 06/25/12 0524  AST 13 13  ALT 11 11  ALKPHOS 62 57  BILITOT 0.2* 0.2*  PROT 5.2* 5.2*  ALBUMIN 2.5* 2.5*   No results found for this basename: LIPASE:2,AMYLASE:2 in the last 72 hours  Basename 06/26/12 0524  WBC 6.6  NEUTROABS --  HGB 9.3*  HCT 27.8*  MCV 87.7  PLT 252    Basename 06/23/12 1642 06/23/12 1111  CKTOTAL -- --  CKMB -- --  TROPONINI <0.30 <0.30    Basename 06/23/12 1111  HGBA1C 6.2*    Basename 06/24/12 0421  TSH 1.077  T4TOTAL --  T3FREE --  THYROIDAB --    Basename 06/24/12 0421  VITAMINB12 --  FOLATE --  FERRITIN 193  TIBC 171*  IRON 62  RETICCTPCT --    Radiology/Studies:  Dg Chest 1 View 06/23/2012  *RADIOLOGY REPORT*  Clinical Data: Respiratory distress  CHEST - 1 VIEW  Comparison: None.  Findings: Heart size upper normal to mildly enlarged.  Mild mediastinal prominence.  Central vascular congestion/hilar prominence.  Perihilar and bibasilar opacities.  Small left pleural effusions suggested.  Associated opacity.  No definite pneumothorax.  No acute osseous finding.  IMPRESSION: Prominent cardiomediastinal contours with interstitial and airspace opacities.  This may reflect pulmonary edema or infection in the appropriate clinical setting.  Small left pleural effusion suggested with associate consolidation, likely atelectasis.  Recommend radiographic follow-up after appropriate therapy to document resolution.   Original Report Authenticated By: Waneta Martins, M.D.    Dg Chest 2 View 06/24/2012  *RADIOLOGY REPORT*  Clinical Data: Pulmonary edema  CHEST - 2 VIEW  Comparison: Chest radiograph  06/23/2012  Findings: Heart size appears within normal limits with two-view technique.  Pulmonary vascular congestion has significantly decreased/resolved since yesterday's chest radiograph.  Aeration of both lungs is significantly improved.  There are small bilateral posteriorly layering pleural effusions.  No focal airspace disease is identified.  There is slight prominence right hilum, favored to be due to prominence of the central pulmonary arteries, however no prior chest radiographs earlier than yesterday, are available to assess for chronicity.  IMPRESSION:  1.  Significant improvement aeration of the lungs compared to 06/23/2012 suggests resolving pulmonary edema. 2.  Small bilateral pleural effusions. 3.  Right hilar prominence is favored to be due to slightly prominent central pulmonary vascularity.  Lymphadenopathy is felt to be less likely, but not excluded.   Original Report Authenticated By: Britta Mccreedy, M.D.    US Renal 06/23/2012  *RADIOLOGY REPORT*  Clinical Data: Acute on chronic kidney disease.  RENAL/URINARY TRACT ULTRASOUND COMPLETE  Comparison:  MR 08/28/2005  Findings:  Right Kidney:  12.2 cm in length.  There are two simple appearing cysts, largest in the upper pole 43 x 45 x 53 mm, smaller in the interpolar region 4.8 cm diameter.  No hydronephrosis or solid renal mass.  Left Kidney:  10.8 cm in length.  No hydronephrosis.  14 x 17 mm cyst in the upper pole.  No solid renal lesion.  Bladder:  Physiologically distended.  Bilateral ureteral jets are documented.  Prostate 2.4 x 2.8 x 3.3 cm.  IMPRESSION:  1.  Negative for hydronephrosis. 2.  Bilateral simple appearing renal cysts, which were present on the prior study.   Original Report Authenticated By: Osa Craver, M.D.     Assessment and Plan  1. Acute pulmonary edema secondary to hypertensive crisis in setting of renal insufficiency  2. LV dysfunction with EF 40-45% & largely global hypokinesis although more prominent in the  inferoposterior wall.  3. Hypertensive urgency  4. CKD stage IV  5. Hyperlipidemia, on statin therapy  6. Hypoalbuminemia 7. 4-beat NSVT 8. Anemia secondary to chronic renal failure (no evidence of bleeding)  Nephrology is on board and has been largely driving workup including fluid balance and lytes. BP meds include amlodipine 10mg , clonidine 0.2mg  TID (increased this morning), hydralazine (changed from 10mg  QID to 25mg  TID last night), labetalol 400mg  BID. Imdur DC'd by Dr. Dietrich Pates. ACEI/ARB is being held given acute on chronic renal failure for now.   CE's negative and no history of chest pain, but given his LV dysfunction, he may require ischemic workup although his cardiomyopathy may be related to hypertension. He is not currently a candidate for cath at present with renal disease and thus will likely plan to continue med rx with  ASA, statin, BB. Would readdress as outpatient for possible Myoview or repeat echo once BP has remained controlled. He describes an intermittent dyspnea prior to admission but this has largely resolved. With regards to NSVT, will add Mg onto today's labs. Keep Mg >2. Continue BB.  Would also appreciate renal recs regarding protein malnutrition (albumin 2.5) and what his home diet should consist of. Pt states there is a chance he could be discharged today, thus scheduled f/u appt with Dr. Diona Browner 07/17/12 at 2:20pm.  Signed, Ronie Spies PA-C   Attending note:  Patient seen and examined. Agree with findings and plan as outlined above. We will manage his cardiomyopathy medically, main focus on control of blood pressure. Renal specific input per nephrology. Outpatient followup can be arranged with [place] to discuss further ischemic assessment, although this will be noninvasive.  Jonelle Sidle, M.D., F.A.C.C.

## 2012-06-27 LAB — BASIC METABOLIC PANEL
Chloride: 106 mEq/L (ref 96–112)
Creatinine, Ser: 3.86 mg/dL — ABNORMAL HIGH (ref 0.50–1.35)
GFR calc Af Amer: 16 mL/min — ABNORMAL LOW (ref >90)
GFR calc non Af Amer: 14 mL/min — ABNORMAL LOW (ref >90)
Potassium: 4.2 mEq/L (ref 3.5–5.1)

## 2012-06-27 MED ORDER — VITAMIN D (ERGOCALCIFEROL) 1.25 MG (50000 UNIT) PO CAPS: 50000.0000 [IU] | ORAL_CAPSULE | ORAL | Status: DC

## 2012-06-27 MED ORDER — HYDRALAZINE HCL 50 MG PO TABS: 50.0000 mg | ORAL_TABLET | Freq: Three times a day (TID) | ORAL | Status: DC

## 2012-06-27 MED ORDER — CALCITRIOL 0.5 MCG PO CAPS: 0.5000 ug | ORAL_CAPSULE | Freq: Every day | ORAL | Status: DC

## 2012-06-27 MED ORDER — EPOETIN ALFA 10000 UNIT/ML IJ SOLN: 6000.0000 [IU] | INTRAMUSCULAR | Status: DC

## 2012-06-27 MED ORDER — HYDRALAZINE HCL 25 MG PO TABS: 50.0000 mg | ORAL_TABLET | Freq: Three times a day (TID) | ORAL | Status: DC

## 2012-06-27 NOTE — Progress Notes (Signed)
[id number] 

## 2012-06-27 NOTE — Discharge Summary (Signed)
Randy [name], SILVERIA NO.:  [id number]  MEDICAL RECORD NO.:  [id number]  LOCATION:  A324                          FACILITY:  [facility]  PHYSICIAN:  Melvyn Novas, MDDATE OF BIRTH:  Nov 28, 1931  DATE OF ADMISSION:  06/23/2012 DATE OF DISCHARGE:  10/05/2013LH                              DISCHARGE SUMMARY   The patient is a 78 year old black male with incidence of accelerated hypertension/pulmonary edema on the basis of this.  He has an ischemic cardiomyopathy, ejection fraction of 40-45%.  The patient was given aggressive antihypertensive therapy, diuresis.  He continued to improve from a pulmonary edema standpoint.  Cardiac enzymes were negative. First two days of hospital he had increasing antihypertensive therapy. Blood pressures were under control.  He had acute component to his chronic renal failure.  Baseline creatinine was 2.8.  It bumped up to 4. He was seen in consultation by Cardiology as well as Renal.  Renal recommended the addition of calcium agents and performed a renin angiotensin level, the results which are not back at the time of this dictation.  The patient continues to improve.  His creatinine continues to drift down to 3.08.  On the day of discharge, he was told to have liberal fluid intake in the form of water while at home and to take these new antihypertensive medicines as prescribed; Rocaltrol 0.5 mcg capsule 1 p.o. daily, Epogen 10,000 units every week, hydralazine 50 mg p.o. t.i.d., vitamin D 50,000 units once a week, allopurinol 300 mg p.o. daily, Norvasc 10 mg p.o. daily, aspirin 81 mg p.o. daily, clonidine 0.2 mg p.o. t.i.d., labetalol 400 mg p.o. b.i.d., multivitamin 1 a day, Zocor 80 mg p.o. daily.  He was told to stop taking indomethacin.  He will follow up in my office in 3-4 days time and follow up also with Renal as well as Cardiology as an outpatient.     Melvyn Novas, MD     RMD/MEDQ  D:  06/27/2012   T:  06/27/2012  Job:  [id number]

## 2012-06-27 NOTE — Progress Notes (Signed)
Discharge instructions given to pt. With understanding and teach back given to RN. Pt. Taken to car via W/C.

## 2012-06-27 NOTE — Progress Notes (Signed)
NAMEABIR, CRAINE NO.:  [id number]  MEDICAL RECORD NO.:  [id number]  LOCATION:  A324                          FACILITY:  [facility]  PHYSICIAN:  Melvyn Novas, MDDATE OF BIRTH:  [birth year]  DATE OF PROCEDURE: DATE OF DISCHARGE:  06/27/2012                                PROGRESS NOTE   The patient had acute on chronic renal failure, creatinine improving from 4.38 to 3.86 today, potassium 4.2, had accelerated hypertension much better control, systolic 157/81.  PHYSICAL EXAMINATION:  VITAL SIGNS:  Temperature 98.4, pulse 70 and regular, respiratory rate is 20, hemoglobin 9.3. LUNGS:  Clear to A and P.  Diminished breath sounds at the bases.  No rales, no rhonchi appreciable. HEART:  Regular rhythm.  No murmurs, gallops, or rubs. EXTREMITIES:  No cyanosis, clubbing, or edema.  PLAN:  Right now is to increase hydralazine to 50 p.o. t.i.d.  Monitor blood pressure results, get BMET in the morning.  Continue IV fluids for renal perfusion and hopefully improvement of the acute process of this chronic renal failure.  We will consider discharge in 24-48 hours.     Melvyn Novas, MD     RMD/MEDQ  D:  06/27/2012  T:  06/27/2012  Job:  [phone] [id number]

## 2012-07-01 LAB — ALDOSTERONE + RENIN ACTIVITY W/ RATIO
ALDO / PRA Ratio: 157.1 Ratio — ABNORMAL HIGH (ref 0.9–28.9)
PRA LC/MS/MS: 0.14 ng/mL/h — ABNORMAL LOW (ref 0.25–5.82)

## 2012-07-10 ENCOUNTER — Other Ambulatory Visit (HOSPITAL_COMMUNITY): Payer: Self-pay | Admitting: Family Medicine

## 2012-07-10 ENCOUNTER — Ambulatory Visit (HOSPITAL_COMMUNITY)
Admission: RE | Admit: 2012-07-10 | Discharge: 2012-07-10 | Disposition: A | Discharge status: Home / Self Care | Payer: Medicare Other | Source: Ambulatory Visit | Attending: Family Medicine | Admitting: Family Medicine

## 2012-07-10 DIAGNOSIS — M25473 Effusion, unspecified ankle: Secondary | ICD-10-CM | POA: Insufficient documentation

## 2012-07-10 DIAGNOSIS — M1990 Unspecified osteoarthritis, unspecified site: Secondary | ICD-10-CM

## 2012-07-10 DIAGNOSIS — M25476 Effusion, unspecified foot: Secondary | ICD-10-CM | POA: Insufficient documentation

## 2012-07-10 DIAGNOSIS — M7730 Calcaneal spur, unspecified foot: Secondary | ICD-10-CM | POA: Insufficient documentation

## 2012-07-10 DIAGNOSIS — M19079 Primary osteoarthritis, unspecified ankle and foot: Secondary | ICD-10-CM | POA: Insufficient documentation

## 2012-07-13 ENCOUNTER — Encounter: Payer: Self-pay | Admitting: Cardiology

## 2012-07-15 ENCOUNTER — Other Ambulatory Visit: Payer: Self-pay

## 2012-07-15 ENCOUNTER — Telehealth: Payer: Self-pay | Admitting: Orthopedic Surgery

## 2012-07-15 DIAGNOSIS — N184 Chronic kidney disease, stage 4 (severe): Secondary | ICD-10-CM

## 2012-07-15 DIAGNOSIS — Z01810 Encounter for preprocedural cardiovascular examination: Secondary | ICD-10-CM

## 2012-07-15 NOTE — Telephone Encounter (Signed)
Do we have appointments ???  I think you can handle this one

## 2012-07-15 NOTE — Telephone Encounter (Signed)
Patient called this morning, also tried calling Dr. Sanjuan Dame office, to request immediate appointment if possible for new problem, right ankle pain; had Xrays at [facility], ordered by Dr. Janna Arch, his primary care physician.  Patient states he is in "a lot of pain" and also "has fluid" around right ankle.  Patient had been last seen here for right knee pain, November 12, 2011.  He also has an upcoming appointment Monday, 07/20/12, for another new problem, wrist pain.  Please advise about scheduling for ankle, wrist.  Ph#'s are [phone],home, and [phone],cell.

## 2012-07-15 NOTE — Telephone Encounter (Signed)
Appointment scheduled patient aware  

## 2012-07-16 ENCOUNTER — Encounter: Payer: Medicare Other | Admitting: Cardiology

## 2012-07-16 ENCOUNTER — Encounter: Payer: Self-pay | Admitting: Orthopedic Surgery

## 2012-07-16 ENCOUNTER — Ambulatory Visit (INDEPENDENT_AMBULATORY_CARE_PROVIDER_SITE_OTHER): Payer: Medicare Other | Admitting: Orthopedic Surgery

## 2012-07-16 VITALS — BP 118/60 | Ht 69.0 in | Wt 181.0 lb

## 2012-07-16 DIAGNOSIS — M6789 Other specified disorders of synovium and tendon, multiple sites: Secondary | ICD-10-CM

## 2012-07-16 DIAGNOSIS — M76829 Posterior tibial tendinitis, unspecified leg: Secondary | ICD-10-CM

## 2012-07-16 MED ORDER — PREDNISONE 10 MG PO KIT: 10.0000 mg | PACK | ORAL | Status: DC

## 2012-07-16 MED ORDER — HYDROCODONE-ACETAMINOPHEN 5-325 MG PO TABS: 1.0000 | ORAL_TABLET | Freq: Four times a day (QID) | ORAL | Status: DC | PRN

## 2012-07-16 NOTE — Progress Notes (Signed)
Patient ID: Randy [name], male   DOB: 1932-07-15, 78 y.o.   MRN: [id number] Chief Complaint  Patient presents with  . Ankle Pain    right ankle pain and swelling x 3 weeks, gradual onset, no known injury    Onset of pain medial side of the ankle with swelling of the entire foot. He says he started having some knee pain and it radiated down to his foot and he notices foot with swelling. His symptoms started 3 weeks ago. He complains of throbbing 10 out of 10 pain morning noon and night only improved when he is off his feet. Pain increases when he tried to walk. He tried to use a cane unsuccessfully. He complains of some numbness and tingling as well as swelling in the foot and ankle. He has a history of flatfoot no symptoms  Review of systems shortness of breath, wheezing, difficulty urinating, swelling and instability with stiffness heat and muscle pain in the foot. Unsteady gait tremors numbness tingling. Excessive thirst.  Other symptoms negative.  Past Medical History  Diagnosis Date  . Hypertension   . Hypercholesterolemia   . Anemia   . Chronic kidney disease   . Gout     BP 118/60  Ht 5\' 9"  (1.753 m)  Wt 181 lb (82.101 kg)  BMI 26.73 kg/m2  Physical Exam(12) GENERAL: normal development   CDV: pulses are normal   Skin: normal  Lymph: nodes were not palpable/normal  Psychiatric: awake, alert and oriented  Neuro: normal sensation  MSK ambulation is very labored with a cane and a limp favoring the involved foot   1 right foot swollen tender Achilles tendon medial sub-malleolar area is tender entire foot swollen. Weakness posterior tibial tendon, flatfoot. Ankle joint stable. Plantar flexion dorsiflexion decreased range of motion. Decreased subtalar range of motion.  Assessment: Posterior tibial tendon dysfunction is the most likely diagnosis perhaps even a gout attack    Plan: Posterior tibial tendon treatment [place]  Walker Walker Anti-inflammatory Norco Limited weight-bearing Three-week followup

## 2012-07-16 NOTE — Patient Instructions (Addendum)
Wear walking boot   Use walker   Take prescriptions as ordered   Posterior Tibial Tendon Tendinitis with Rehab Tendonitis is a condition that is characterized by inflammation of a tendon or the lining (sheath) that surrounds it. The inflammation is usually caused by damage to the tendon, such as a tendon tear (strain). Sprains are classified into three categories. Grade 1 sprains cause pain, but the tendon is not lengthened. Grade 2 sprains include a lengthened ligament due to the ligament being stretched or partially ruptured. With grade 2 sprains there is still function, although the function may be diminished. Grade 3 sprains are characterized by a complete tear of the tendon or muscle, and function is usually impaired. Posterior tibialis tendonitis is tendonitis of the posterior tibial tendon, which attaches muscles of the lower leg to the foot. The posterior tibial tendon is located in the back of the ankle and helps the body straighten (plantarflex) and rotate inward (medially rotate) the ankle. SYMPTOMS    Pain, tenderness, swelling, warmth, and/or redness over the back of the inner ankle at the posterior tibial tendon or the inner part of the mid-foot.   Pain that worsens with plantarflexion or medial rotation of the ankle.   A crackling sound (crepitation) when the tendon is moved or touched.  CAUSES   Posterior tibial tendonitis occurs when damage to the posterior tibial tendon starts an inflammatory response. Common mechanisms of injury include:  Degenerative (occurs with aging) processes that weaken the tendon and make it more susceptible to injury.   Stress placed on the tendon from an increase in the intensity, frequency, or duration of training.   Direct trauma to the ankle.   Returning to activity before a previous ankle injury is allowed to heal.  RISK INCREASES WITH:  Activities that involve repetitive and/or stressful plantarflexion (jumping, kicking, or running up/down  hills).   Poor strength and flexibility.   Flat feet.   Previous injury to the foot, ankle, or leg.  PREVENTION    Warm up and stretch properly before activity.   Allow for adequate recovery between workouts.   Maintain physical fitness:   Strength, flexibility, and endurance.   Cardiovascular fitness.   Learn and use proper technique. When possible, have a coach correct improper technique.   Complete rehabilitation from a previous foot, ankle, or leg injury.   If you have flat feet, wear arch supports (orthotics).  PROGNOSIS   If treated properly, then the symptoms of tendonitis usually resolve within 6 weeks. This period may be shorter for injuries caused by direct trauma. RELATED COMPLICATIONS    Prolonged healing time, if improperly treated or re-injured.   Recurrent symptoms that result in a chronic problem.   Partial or complete tendon tear (rupture) requiring surgery.  TREATMENT   Treatment initially involves the use of ice and medication to help reduce pain and inflammation. The use of strengthening and stretching exercises may help reduce pain with activity. These exercises may be performed at home or with referral to a therapist. Often times, your caregiver will recommend immobilizing the ankle to allow the tendon to heal. If you have flat feet, the you may be advised to wear orthotic arch supports. If symptoms persist for greater than 6 months despite non-surgical (conservative) treatment, then surgery may be recommended. MEDICATION    If pain medication is necessary, then nonsteroidal anti-inflammatory medications, such as aspirin and ibuprofen, or other minor pain relievers, such as acetaminophen, are often recommended.   Do not  take pain medication for 7 days before surgery.   Prescription pain relievers may be given if deemed necessary by your caregiver. Use only as directed and only as much as you need.   Corticosteroid injections may be given by your  caregiver. These injections should be reserved for the most serious cases, because they may only be given a certain number of times.  HEAT AND COLD  Cold treatment (icing) relieves pain and reduces inflammation. Cold treatment should be applied for 10 to 15 minutes every 2 to 3 hours for inflammation and pain and immediately after any activity that aggravates your symptoms. Use ice packs or massage the area with a piece of ice (ice massage).   Heat treatment may be used prior to performing the stretching and strengthening activities prescribed by your caregiver, physical therapist, or athletic trainer. Use a heat pack or soak the injury in warm water.  SEEK MEDICAL CARE IF:  Treatment seems to offer no benefit, or the condition worsens.   Any medications produce adverse side effects.

## 2012-07-17 ENCOUNTER — Encounter: Payer: Medicare Other | Admitting: Cardiology

## 2012-07-17 ENCOUNTER — Encounter: Payer: Self-pay | Admitting: Cardiology

## 2012-07-17 ENCOUNTER — Encounter: Payer: Self-pay | Admitting: *Deleted

## 2012-07-17 ENCOUNTER — Ambulatory Visit (INDEPENDENT_AMBULATORY_CARE_PROVIDER_SITE_OTHER): Payer: Medicare Other | Admitting: Cardiology

## 2012-07-17 VITALS — BP 162/58 | HR 84 | Ht 69.0 in | Wt 184.1 lb

## 2012-07-17 DIAGNOSIS — N184 Chronic kidney disease, stage 4 (severe): Secondary | ICD-10-CM

## 2012-07-17 DIAGNOSIS — I10 Essential (primary) hypertension: Secondary | ICD-10-CM

## 2012-07-17 DIAGNOSIS — I429 Cardiomyopathy, unspecified: Secondary | ICD-10-CM

## 2012-07-17 DIAGNOSIS — I119 Hypertensive heart disease without heart failure: Secondary | ICD-10-CM

## 2012-07-17 MED ORDER — LABETALOL HCL 200 MG PO TABS: 400.0000 mg | ORAL_TABLET | Freq: Two times a day (BID) | ORAL | Status: DC

## 2012-07-17 NOTE — Assessment & Plan Note (Signed)
Symptomatically stable, much improved from hospitalization. LVEF 40-45% by most recent assessment. Will focus on medical therapy and observation. No indication for device at this time.

## 2012-07-17 NOTE — Assessment & Plan Note (Signed)
Followup BMET for next visit. 

## 2012-07-17 NOTE — Assessment & Plan Note (Signed)
Continue current regimen with increase in labetalol to 400 mg twice daily.

## 2012-07-17 NOTE — Patient Instructions (Addendum)
Your physician recommends that you schedule a follow-up appointment in: 6 weeks  Your physician recommends that you return for lab work in: Just prior to follow up appointment

## 2012-07-17 NOTE — Progress Notes (Signed)
Clinical Summary Mr. Erstad is a 78 y.o.male presenting for post hospital followup. We saw him at that time with pulmonary edema and respiratory failure in the setting of systolic heart failure exacerbation and hypertensive urgency. Echocardiogram at that time demonstrated LVEF 40-45% with global hypokinesis, suspected nonischemic cardiomyopathy at this point. His cardiac markers were reassuring. Medical therapy was modified and he is doing much better today in followup.  He states that he has been taking his medications, did not bring a list in today. He denies any chest pain. He is using a walker at this point somewhat limited ambulation. No falls.  Labwork from 10/5 showed potassium 4.2, BUN 49, creatinine 3.8. He has had nephrology evaluation.   No Known Allergies  Current Outpatient Prescriptions  Medication Sig Dispense Refill  . allopurinol (ZYLOPRIM) 300 MG tablet Take 300 mg by mouth every morning.       Marland Kitchen amLODipine (NORVASC) 10 MG tablet Take 10 mg by mouth every morning.       Marland Kitchen aspirin EC 81 MG tablet Take 81 mg by mouth every 7 (seven) days.      . calcitRIOL (ROCALTROL) 0.5 MCG capsule Take 1 capsule (0.5 mcg total) by mouth daily.  30 capsule  3  . cloNIDine (CATAPRES) 0.1 MG tablet Take 0.1 mg by mouth 2 (two) times daily.      Marland Kitchen epoetin alfa (EPOGEN,PROCRIT) 08657 UNIT/ML injection Inject 0.6 mLs (6,000 Units total) into the skin every 14 (fourteen) days.  1 mL  3  . hydrALAZINE (APRESOLINE) 50 MG tablet Take 1 tablet (50 mg total) by mouth 3 (three) times daily.  90 tablet  3  . HYDROcodone-acetaminophen (NORCO) 5-325 MG per tablet Take 1 tablet by mouth every 6 (six) hours as needed for pain.  30 tablet  0  . Multiple Vitamin (MULITIVITAMIN WITH MINERALS) TABS Take 1 tablet by mouth daily.      . PredniSONE 10 MG KIT Take 1 kit (10 mg total) by mouth as directed.  1 kit  0  . simvastatin (ZOCOR) 80 MG tablet Take 80 mg by mouth at bedtime.        . Vitamin D,  Ergocalciferol, (DRISDOL) 50000 UNITS CAPS Take 1 capsule (50,000 Units total) by mouth every 7 (seven) days.  4 capsule  3  . labetalol (NORMODYNE) 200 MG tablet Take 2 tablets (400 mg total) by mouth 2 (two) times daily.  120 tablet  6    Past Medical History  Diagnosis Date  . Hypertensive heart disease   . Hypercholesterolemia   . CKD (chronic kidney disease) stage 4, GFR 15-29 ml/min   . Gout   . Secondary cardiomyopathy     LVEF 40-45%  . Anemia of chronic disease     Social History Mr. Running reports that he has quit smoking. His smoking use included Cigarettes. He has a 30 pack-year smoking history. He quit smokeless tobacco use about 18 years ago. Mr. Minser reports that he does not drink alcohol.  Review of Systems No palpitations or syncope. No orthopnea. Mild peripheral edema. Otherwise negative.  Physical Examination Filed Vitals:   07/17/12 0904  BP: 162/58  Pulse: 84   Filed Weights   07/17/12 0904  Weight: 184 lb 1.3 oz (83.498 kg)   No acute distress. HEENT: Conjunctiva and lids normal, oropharynx clear. Neck: Supple, no elevated JVP or carotid bruits, no thyromegaly. Lungs: Clear to auscultation, nonlabored breathing at rest. Cardiac: Regular rate and rhythm, no S3, 2/6  systolic murmur, no pericardial rub. Abdomen: Soft, nontender, bowel sounds present. Extremities: Trace edema, distal pulses 2+. Skin: Warm and dry. Musculoskeletal: No kyphosis. Neuropsychiatric: Alert and oriented x3, affect grossly appropriate.   Problem List and Plan   Secondary cardiomyopathy, unspecified Symptomatically stable, much improved from hospitalization. LVEF 40-45% by most recent assessment. Will focus on medical therapy and observation. No indication for device at this time.  Hypertensive heart disease Continue current regimen with increase in labetalol to 400 mg twice daily.  CKD (chronic kidney disease) stage 4, GFR 15-29 ml/min Followup BMET for next  visit.    Jonelle Sidle, M.D., F.A.C.C.

## 2012-07-20 ENCOUNTER — Ambulatory Visit: Payer: Medicare Other | Admitting: Orthopedic Surgery

## 2012-07-20 ENCOUNTER — Encounter: Payer: Self-pay | Admitting: Orthopedic Surgery

## 2012-07-27 ENCOUNTER — Encounter: Payer: Self-pay | Admitting: Vascular Surgery

## 2012-07-28 ENCOUNTER — Ambulatory Visit (INDEPENDENT_AMBULATORY_CARE_PROVIDER_SITE_OTHER): Payer: Medicare Other | Admitting: Vascular Surgery

## 2012-07-28 ENCOUNTER — Encounter: Payer: Self-pay | Admitting: *Deleted

## 2012-07-28 ENCOUNTER — Encounter: Payer: Self-pay | Admitting: Vascular Surgery

## 2012-07-28 ENCOUNTER — Other Ambulatory Visit: Payer: Self-pay | Admitting: *Deleted

## 2012-07-28 ENCOUNTER — Encounter (INDEPENDENT_AMBULATORY_CARE_PROVIDER_SITE_OTHER): Payer: Medicare Other | Admitting: *Deleted

## 2012-07-28 VITALS — BP 170/87 | HR 73 | Ht 69.0 in | Wt 189.0 lb

## 2012-07-28 DIAGNOSIS — N184 Chronic kidney disease, stage 4 (severe): Secondary | ICD-10-CM

## 2012-07-28 DIAGNOSIS — Z01810 Encounter for preprocedural cardiovascular examination: Secondary | ICD-10-CM

## 2012-07-28 DIAGNOSIS — N186 End stage renal disease: Secondary | ICD-10-CM

## 2012-07-28 NOTE — Progress Notes (Signed)
[facility] [place]   Patient name: Randy [name] MRN: [id number] DOB: 10/03/1931 Sex: male   Referred by: Befekadu  Reason for referral:  Chief Complaint  Patient presents with  . New Evaluation    eval for access placement/ Dr. Befekadu    HISTORY OF PRESENT ILLNESS: Patient is a 78-year-old gentleman with progressive renal insufficiency. He was recently hospitalized with congestive heart failure and recently renal function. We are seeing him for discussion of AV access. I do have medical records from his outpatient workup. Most recent creatinine ICD is in the 3.5 range. He does not have any uremic symptoms. He has never been on hemodialysis.  Past Medical History  Diagnosis Date  . Hypertensive heart disease   . Hypercholesterolemia   . CKD (chronic kidney disease) stage 4, GFR 15-29 ml/min   . Gout   . Secondary cardiomyopathy     LVEF 40-45%  . Anemia of chronic disease     Past Surgical History  Procedure Date  . Knee surgery 2007    Right knee  . Back surgery 2004  . Hemorrhoid surgery 40 years ago  . Ganglion cyst excision 01/03/2012    Procedure: REMOVAL GANGLION OF WRIST;  Surgeon: Kairen S Bradford, MD;  Location: [facility];  Service: General;  Laterality: Right;    History   Social History  . Marital Status: Married    Spouse Name: N/A    Number of Children: N/A  . Years of Education: 10   Occupational History  .     Social History Main Topics  . Smoking status: Former Smoker -- 1.0 packs/day for 30 years    Types: Cigarettes  . Smokeless tobacco: Former User    Quit date: 12/31/1993  . Alcohol Use: No  . Drug Use: No  . Sexually Active: Yes    Birth Control/ Protection: None   Other Topics Concern  . Not on file   Social History Narrative  . No narrative on file    Family History  Problem Relation Age of Onset  . Arthritis    . Cancer    . Kidney disease    . Anesthesia problems Neg Hx   . Hypotension Neg Hx   .  Malignant hyperthermia Neg Hx   . Pseudochol deficiency Neg Hx   . Cancer Sister   . Cancer Brother     colon  . Cancer Brother     colon    Allergies as of 07/28/2012  . (No Known Allergies)    Current Outpatient Prescriptions on File Prior to Visit  Medication Sig Dispense Refill  . allopurinol (ZYLOPRIM) 300 MG tablet Take 300 mg by mouth every morning.       . amLODipine (NORVASC) 10 MG tablet Take 10 mg by mouth every morning.       . aspirin EC 81 MG tablet Take 81 mg by mouth every 7 (seven) days.      . calcitRIOL (ROCALTROL) 0.5 MCG capsule Take 1 capsule (0.5 mcg total) by mouth daily.  30 capsule  3  . cloNIDine (CATAPRES) 0.1 MG tablet Take 0.1 mg by mouth 2 (two) times daily.      . epoetin alfa (EPOGEN,PROCRIT) 10000 UNIT/ML injection Inject 0.6 mLs (6,000 Units total) into the skin every 14 (fourteen) days.  1 mL  3  . hydrALAZINE (APRESOLINE) 50 MG tablet Take 1 tablet (50 mg total) by mouth 3 (three) times daily.  90 tablet  3  .   HYDROcodone-acetaminophen (NORCO) 5-325 MG per tablet Take 1 tablet by mouth every 6 (six) hours as needed for pain.  30 tablet  0  . labetalol (NORMODYNE) 200 MG tablet Take 2 tablets (400 mg total) by mouth 2 (two) times daily.  120 tablet  6  . Multiple Vitamin (MULITIVITAMIN WITH MINERALS) TABS Take 1 tablet by mouth daily.      . PredniSONE 10 MG KIT Take 1 kit (10 mg total) by mouth as directed.  1 kit  0  . simvastatin (ZOCOR) 80 MG tablet Take 80 mg by mouth at bedtime.        . Vitamin D, Ergocalciferol, (DRISDOL) 50000 UNITS CAPS Take 1 capsule (50,000 Units total) by mouth every 7 (seven) days.  4 capsule  3     REVIEW OF SYSTEMS:  Positives indicated with an "X"  CARDIOVASCULAR:  [ ] chest pain   [ ] chest pressure   [ ] palpitations   [x ] orthopnea   [ ] dyspnea on exertion   [ ] claudication   [ ] rest pain   [ ] DVT   [ ] phlebitis PULMONARY:   [ ] productive cough   [ ] asthma   x ] wheezing NEUROLOGIC:   [ ] weakness   [ ] paresthesias  [ ] aphasia  [ ] amaurosis  [ ] dizziness HEMATOLOGIC:   [ ] bleeding problems   [ ] clotting disorders MUSCULOSKELETAL:  [ ] joint pain   [ ] joint swelling GASTROINTESTINAL: [ ]  blood in stool  [ ]  hematemesis GENITOURINARY:  [ ]  dysuria  [ ]  hematuria PSYCHIATRIC:  [ ] history of major depression INTEGUMENTARY:  [ ] rashes  [ ] ulcers CONSTITUTIONAL:  [ ] fever   [ ] chills  PHYSICAL EXAMINATION:  General: The patient is a well-nourished male, in no acute distress. Vital signs are BP 170/87  Pulse 73  Ht 5' 9" (1.753 m)  Wt 189 lb (85.73 kg)  BMI 27.91 kg/m2  SpO2 100% Pulmonary: There is a good air exchange bilaterally without wheezing or rales. Abdomen: Soft and non-tender with normal pitch bowel sounds. Musculoskeletal: There are no major deformities.  There is no significant extremity pain. Neurologic: No focal weakness or paresthesias are detected, Skin: There are no ulcer or rashes noted. Psychiatric: The patient has normal affect. Cardiovascular: There is a regular rate and rhythm without significant murmur appreciated. 2+ radial pulses bilaterally  [facility] Vascular Lab Studies:  Ordered and Independently Reviewed vein map reveals patent cephalic veins bilaterally. Better size on the left than on the right.  Impression and Plan:  Chronic renal insufficiency. I had a long discussion with the patient and his wife present. I explained our roll and providing access. I discussed hemodialysis catheter for acute access which he does not need. I discussed AV fistula an AV graft. I do feel that he is an acceptable cephalic vein for AV fistula attempt. We have scheduled this at his convenience as an outpatient at [facility] on 08/05/12    Antwoine Zorn [facility] [place] Office: [phone]         

## 2012-07-29 ENCOUNTER — Encounter (HOSPITAL_COMMUNITY): Payer: Self-pay | Admitting: Pharmacy Technician

## 2012-07-30 ENCOUNTER — Encounter (HOSPITAL_COMMUNITY)
Admission: RE | Admit: 2012-07-30 | Discharge: 2012-07-30 | Disposition: A | Discharge status: Home / Self Care | Payer: Medicare Other | Source: Ambulatory Visit | Attending: Vascular Surgery | Admitting: Vascular Surgery

## 2012-07-30 ENCOUNTER — Encounter (HOSPITAL_COMMUNITY): Payer: Self-pay

## 2012-07-30 DIAGNOSIS — J90 Pleural effusion, not elsewhere classified: Secondary | ICD-10-CM | POA: Insufficient documentation

## 2012-07-30 DIAGNOSIS — I471 Supraventricular tachycardia, unspecified: Secondary | ICD-10-CM | POA: Insufficient documentation

## 2012-07-30 DIAGNOSIS — I10 Essential (primary) hypertension: Secondary | ICD-10-CM | POA: Insufficient documentation

## 2012-07-30 DIAGNOSIS — I509 Heart failure, unspecified: Secondary | ICD-10-CM | POA: Insufficient documentation

## 2012-07-30 DIAGNOSIS — Z01818 Encounter for other preprocedural examination: Secondary | ICD-10-CM | POA: Insufficient documentation

## 2012-07-30 HISTORY — DX: Unspecified osteoarthritis, unspecified site: M19.90

## 2012-07-30 LAB — SURGICAL PCR SCREEN: Staphylococcus aureus: NEGATIVE

## 2012-07-30 NOTE — Pre-Procedure Instructions (Signed)
20 Randy [name]  07/30/2012   Your procedure is scheduled on:  Wednesday, November 13th.  Report to [facility] at 7:30AM.  Call this number if you have problems the morning of surgery: [phone]   Remember:   Do not eat food or anything to drink:After Midnight.    Take these medicines the morning of surgery with A SIP OF WATER: Clonidine (Catapres), Labetalol (Normodyne), Hydralazine (Apresoline).    Do not wear jewelry, make-up or nail polish.  Do not wear lotions, powders, or perfumes. You may wear deodorant.  Do not shave 48 hours prior to surgery. Men may shave face and neck.  Do not bring valuables to the hospital.  Contacts, dentures or bridgework may not be worn into surgery.  Leave suitcase in the car. After surgery it may be brought to your room.  For patients admitted to the hospital, checkout time is 11:00 AM the day of discharge.   Patients discharged the day of surgery will not be allowed to drive home.  Name and phone number of your driver: -___________  Special Instructions: Shower using CHG 2 nights before surgery and the night before surgery.  If you shower the day of surgery use CHG.  Use special wash - you have one bottle of CHG for all showers.  You should use approximately 1/3 of the bottle for each shower.   Please read over the following fact sheets that you were given: Pain Booklet, Coughing and Deep Breathing and MRSA Information

## 2012-07-31 ENCOUNTER — Encounter (HOSPITAL_COMMUNITY): Payer: Medicare Other | Attending: Oncology | Admitting: Oncology

## 2012-07-31 ENCOUNTER — Encounter (HOSPITAL_COMMUNITY): Payer: Self-pay | Admitting: Oncology

## 2012-07-31 VITALS — BP 146/70 | HR 81 | Temp 97.8°F | Resp 20 | Wt 187.2 lb

## 2012-07-31 DIAGNOSIS — D638 Anemia in other chronic diseases classified elsewhere: Secondary | ICD-10-CM | POA: Insufficient documentation

## 2012-07-31 DIAGNOSIS — M545 Low back pain, unspecified: Secondary | ICD-10-CM | POA: Insufficient documentation

## 2012-07-31 DIAGNOSIS — I428 Other cardiomyopathies: Secondary | ICD-10-CM

## 2012-07-31 DIAGNOSIS — N186 End stage renal disease: Secondary | ICD-10-CM

## 2012-07-31 DIAGNOSIS — Q613 Polycystic kidney, unspecified: Secondary | ICD-10-CM | POA: Insufficient documentation

## 2012-07-31 DIAGNOSIS — D631 Anemia in chronic kidney disease: Secondary | ICD-10-CM

## 2012-07-31 DIAGNOSIS — I120 Hypertensive chronic kidney disease with stage 5 chronic kidney disease or end stage renal disease: Secondary | ICD-10-CM | POA: Insufficient documentation

## 2012-07-31 DIAGNOSIS — I10 Essential (primary) hypertension: Secondary | ICD-10-CM

## 2012-07-31 DIAGNOSIS — M1990 Unspecified osteoarthritis, unspecified site: Secondary | ICD-10-CM | POA: Insufficient documentation

## 2012-07-31 DIAGNOSIS — E785 Hyperlipidemia, unspecified: Secondary | ICD-10-CM | POA: Insufficient documentation

## 2012-07-31 DIAGNOSIS — I429 Cardiomyopathy, unspecified: Secondary | ICD-10-CM | POA: Insufficient documentation

## 2012-07-31 LAB — COMPREHENSIVE METABOLIC PANEL
ALT: 41 U/L (ref 0–53)
AST: 26 U/L (ref 0–37)
CO2: 23 mEq/L (ref 19–32)
Calcium: 9.2 mg/dL (ref 8.4–10.5)
Chloride: 104 mEq/L (ref 96–112)
Creatinine, Ser: 3.29 mg/dL — ABNORMAL HIGH (ref 0.50–1.35)
GFR calc Af Amer: 19 mL/min — ABNORMAL LOW (ref >90)
GFR calc non Af Amer: 17 mL/min — ABNORMAL LOW (ref >90)
Glucose, Bld: 89 mg/dL (ref 70–99)
Total Bilirubin: 0.4 mg/dL (ref 0.3–1.2)

## 2012-07-31 LAB — CBC WITH DIFFERENTIAL/PLATELET
Eosinophils Relative: 2 % (ref 0–5)
HCT: 28.2 % — ABNORMAL LOW (ref 39.0–52.0)
Hemoglobin: 9 g/dL — ABNORMAL LOW (ref 13.0–17.0)
Lymphocytes Relative: 10 % — ABNORMAL LOW (ref 12–46)
Lymphs Abs: 0.9 10*3/uL (ref 0.7–4.0)
MCV: 89.8 fL (ref 78.0–100.0)
Monocytes Absolute: 0.7 10*3/uL (ref 0.1–1.0)
Monocytes Relative: 8 % (ref 3–12)
Neutro Abs: 7 10*3/uL (ref 1.7–7.7)
RBC: 3.14 MIL/uL — ABNORMAL LOW (ref 4.22–5.81)
WBC: 8.8 10*3/uL (ref 4.0–10.5)

## 2012-07-31 NOTE — Patient Instructions (Signed)
[facility]  Discharge Instructions  RECOMMENDATIONS MADE BY THE CONSULTANT AND ANY TEST RESULTS WILL BE SENT TO YOUR REFERRING DOCTOR.   EXAM FINDINGS BY MD TODAY AND SIGNS AND SYMPTOMS TO REPORT TO CLINIC OR PRIMARY MD: Exam findings as discussed by T. Kefalas, PA-C.  SPECIAL INSTRUCTIONS/FOLLOW-UP: 1.  We performed lab tests on you today, and we will contact you with results. 2.  We will see you again in 3 months in the office.  I acknowledge that I have been informed and understand all the instructions given to me and received a copy. I do not have any more questions at this time, but understand that I may call the Specialty Clinic at [facility] at [phone] during business hours should I have any further questions or need assistance in obtaining follow-up care.    __________________________________________  _____________  __________ Signature of Patient or Authorized Representative            Date                   Time    __________________________________________ Nurse's Signature

## 2012-07-31 NOTE — Progress Notes (Signed)
[facility] NEW PATIENT EVALUATION   Name: Randy [name] Date: 07/31/2012 MRN: [id number] DOB: 1931/11/04    CC: Isabella Stalling, MD     DIAGNOSIS: The encounter diagnosis was Anemia in chronic kidney disease(285.21).   HISTORY OF PRESENT ILLNESS:Randy [name] is a 78 y.o. African American male with end-stage renal disease who was referred to the [facility] for management of his chronic anemia.   Laboratory work is reviewed and appears that the patient has an anemia of chronic disease with a low TIBC, normal saturation levels, low-normal serum iron, normal ferritin, and hemoglobin of 9.3 on 06/26/2012.  Folate and B12 are within normal limits.  The patient has end-stage renal disease and is scheduled for an AV fistula on 08/05/2012 according to Dr. Bosie Helper note in preparation for hemodialysis.. The patient also has secondary cardiomyopathy with ejection fraction of 40-45% and hypertension managed by Dr. Diona Browner (cardiologist).  Initially during her discussion, the patient's wife wishes to be seen today in the clinic. She is scheduled for a consultation 08/12/2012 for anemia as well. She is frustrated by the fact that I am unable to see her today. We'll see her as scheduled.  The patient is seen for workup of his anemia. His hemoglobin is appreciated to be 9.3 on 06/26/2012. Prior to that date, his hemoglobin has ranged between 10-12.2 dating back to a least 2008.  The patient denies headaches, dizziness, double vision, fevers, chills, night sweats, nausea, vomiting, diarrhea, constipation, abdominal pain, chest pain, heart palpitations, blood in stool, black tarry stool, hematuria, urinary pain/burning/frequency, gingival bleeding, epistaxis, blood loss. He denies any gastric surgeries in the past. He reports that he needs a fairly well balanced diet.  The patient reports that he feels well.  I spent some time with the patient discussing his diagnosis of  anemia, likely anemia of chronic disease particularly with the aforementioned laboratory results. We discussed that his renal issues in conjunction with his heart problems are likely the culprit of his anemia. We will complete a workup which will include a multiple myeloma panel in addition to repeat of iron studies. Since he is asymptomatic, with a hemoglobin greater than 9 g/dL, would not initiate Procrit/Aranesp therapy at this point time.     FAMILY HISTORY: family history includes Arthritis in an unspecified family member; Cancer in his brothers, sister, and unspecified family member; and Kidney disease in an unspecified family member.  There is no history of Anesthesia problems, and Hypotension, and Malignant hyperthermia, and Pseudochol deficiency, .  Patient reports his mother passed weight the age of 62 secondary to a stroke. His father passed weight the age of 31 due to "old age". He has one sister who is living who is 45. He has 2 brothers who are deceased secondary to prostate cancer who passed weight the age of 39 and 57. He has an additional sister passed weight the age of 26 from cancer that is unknown to the patient as to what type. The patient has 3 children himself. He has one son who is 3 years old and is alive and well. He has 2 daughters were 6 and 76 years old who again are alive and well. He has 2 grandchildren who are healthy.   PAST MEDICAL HISTORY:  has a past medical history of Hypertensive heart disease; Hypercholesterolemia; CKD (chronic kidney disease) stage 4, GFR 15-29 ml/min; Gout; Secondary cardiomyopathy; Anemia of chronic disease; Hypertension; CHF (congestive heart failure); Shortness of breath; and Arthritis.  CURRENT MEDICATIONS: See CH[facility]   SOCIAL HISTORY:  reports that he has quit smoking. His smoking use included Cigarettes. He has a 30 pack-year smoking history. He quit smokeless tobacco use about 18 years ago. He reports that he does not drink  alcohol or use illicit drugs.  Patient was born and raised in [place], [place]. He completed 11 grade of high school. 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  This is his second marriage and he is been married to his present wife for 35 years. His first wife he was married to her for 20 something years. She is the mother to the children mentioned above in the family history. He admits to a one pack per day x35 year smoking history quitting in 1998. He denies any alcohol or illicit drug abuse.    ALLERGIES: Review of patient's allergies indicates no known allergies.   LABORATORY DATA:  Results for orders placed during the hospital encounter of 07/30/12 (from the past 48 hour(s))  SURGICAL PCR SCREEN     Status: Normal   Collection Time   07/30/12  9:35 AM      Component Value Range Comment   MRSA, PCR NEGATIVE  NEGATIVE    Staphylococcus aureus NEGATIVE  NEGATIVE        RADIOGRAPHY: Dg Chest 2 View  07/30/2012  *RADIOLOGY REPORT*  Clinical Data: patient for arterial venous fistula creation.  CHEST - 2 VIEW  Comparison: 06/24/2012  Findings: The heart size appears normal.  There are small bilateral pleural effusions and mild interstitial edema.  Atelectasis noted in the lung bases, left greater than right.  IMPRESSION:  1.  Mild CHF.   Original Report Authenticated By: Signa Kell, M.D.         REVIEW OF SYSTEMS: Patient reports no health concerns.   PHYSICAL EXAM:  weight is 187 lb 3.2 oz (84.913 kg). His temperature is 97.8 F (36.6 C). His blood pressure is 146/70 and his pulse is 81. His respiration is 20.  General appearance: alert, cooperative, appears stated age, no distress and mildly obese Head: Normocephalic, without obvious abnormality, atraumatic Neck: no adenopathy, supple, symmetrical, trachea midline and thyroid not enlarged, symmetric, no tenderness/mass/nodules Lymph nodes: Cervical, supraclavicular, and axillary nodes  normal. Resp: clear to auscultation bilaterally and normal percussion bilaterally Back: symmetric, no curvature. ROM normal. No CVA tenderness. Cardio: regular rate and rhythm, S1, S2 normal, no murmur, click, rub or gallop Genitalia: defer exam Extremities: extremities normal, atraumatic, no cyanosis or edema Neurologic: Grossly normal     IMPRESSION:  1. Anemia, anemia of chronic disease 2. End-Stage Renal Failure 3. HTN, followed by Cardiology 4. Secondary cardiomyopathy, EF 40-45%, followed by cardiology 5. Hyperlipidemia 6. Chronic low back pain 7. Osteoarthritis 8. Referral vascular diseas 9. Polycystic kidney disease, 10. Restless leg syndrome  PLAN:  1. Patient's chart reviewed 2. I personally reviewed and went over laboratory results with the patient. 2. I personally reviewed and went over radiographic studies with the patient. 3. Lab work today: CBC diff, CMET, Anemia panel, MM panel 4. Patient education provided regarding the likely etiology of his anemia. 5. Since the patient is asymptomatic and hemoglobin greater than 9 g/dL, would not initiate Procrit or Aranesp therapy at this point time. 6. Return to clinic in 8-12 weeks for followup.  All questions were answered. The patient is call the clinic with any problems, questions, or concerns.  Patient and plan discussed with Dr. Glenford Peers  and he is in agreement with the aforementioned. Patient seen and examined by Dr. Glenford Peers as well.  Brody Kump

## 2012-08-01 LAB — IRON AND TIBC
Iron: 22 ug/dL — ABNORMAL LOW (ref 42–135)
UIBC: 175 ug/dL (ref 125–400)

## 2012-08-01 LAB — FERRITIN: Ferritin: 182 ng/mL (ref 22–322)

## 2012-08-03 LAB — KAPPA/LAMBDA LIGHT CHAINS: Kappa free light chain: 4.25 mg/dL — ABNORMAL HIGH (ref 0.33–1.94)

## 2012-08-04 ENCOUNTER — Telehealth: Payer: Self-pay

## 2012-08-04 LAB — FOLATE: Folate: 9 ng/mL (ref >5.4)

## 2012-08-04 LAB — MULTIPLE MYELOMA PANEL, SERUM
Albumin ELP: 54.8 % — ABNORMAL LOW (ref 55.8–66.1)
Alpha-1-Globulin: 8.2 % — ABNORMAL HIGH (ref 2.9–4.9)
Alpha-2-Globulin: 14.1 % — ABNORMAL HIGH (ref 7.1–11.8)
Gamma Globulin: 10.5 % — ABNORMAL LOW (ref 11.1–18.8)
IgG (Immunoglobin G), Serum: 557 mg/dL — ABNORMAL LOW (ref 650–1600)
IgM, Serum: 115 mg/dL (ref 41–251)

## 2012-08-04 NOTE — Telephone Encounter (Signed)
Pt. Called to cancel surgery for 08/05/12.  Stated his legs and feet are too swollen, and he has an appt. with his medical doctor tomorrow.  Advised pt. That  After placement of the AVF, there is a 12 wk. time frame that the fistula cannot be used, and this is a consideration if pt. Is nearing need of hemodialysis.  Pt. Verb. Understanding, and requested to cancel the surgery.    Pt. Stated he will call office when he is ready to reschedule.

## 2012-08-05 ENCOUNTER — Encounter (HOSPITAL_COMMUNITY): Admission: RE | Payer: Self-pay | Source: Ambulatory Visit

## 2012-08-05 ENCOUNTER — Ambulatory Visit (HOSPITAL_COMMUNITY): Admission: RE | Admit: 2012-08-05 | Payer: Medicare Other | Source: Ambulatory Visit | Admitting: Vascular Surgery

## 2012-08-05 SURGERY — ARTERIOVENOUS (AV) FISTULA CREATION
Anesthesia: Monitor Anesthesia Care | Site: Arm Lower | Laterality: Left

## 2012-08-06 ENCOUNTER — Ambulatory Visit (INDEPENDENT_AMBULATORY_CARE_PROVIDER_SITE_OTHER): Payer: Medicare Other | Admitting: Orthopedic Surgery

## 2012-08-06 ENCOUNTER — Encounter: Payer: Self-pay | Admitting: Orthopedic Surgery

## 2012-08-06 VITALS — Ht 69.0 in | Wt 181.0 lb

## 2012-08-06 DIAGNOSIS — M6789 Other specified disorders of synovium and tendon, multiple sites: Secondary | ICD-10-CM

## 2012-08-06 DIAGNOSIS — M76829 Posterior tibial tendinitis, unspecified leg: Secondary | ICD-10-CM

## 2012-08-06 NOTE — Progress Notes (Signed)
Patient ID: Randy [name], male   DOB: 03-02-1932, 78 y.o.   MRN: [id number] Chief Complaint  Patient presents with  . Follow-up    3 week recheck on right ankle PTTD.   The patient has been in a Cam Walker for 3 weeks she's been using a cane he progressed steroid Dosepak he improved slightly  His pain level is much improved. The swelling has improved only slightly  He started to have some kidney dysfunction with a creatinine of 3.2 ALT although one month ago it was 4.0 his doctors have told him he may need dialysis catheter  Exam he is ambulatory with his Cam Walker and his pain and does pretty well with that. He still has tenderness and swelling over the posterior tibial tendon and around the medial side of his ankle. The pain with range of motion is decreased significantly his ankle remains stable he has weakness of the posterior tibial tendon the skin is scaly especially dorsally where the swelling is gone down he has edema in both legs pitting. Otherwise he was awake alert and oriented x3 mood and affect were normal.  He is frustrated with the swelling in his ankle  Recommend continued treatment we offered him physical therapy he wanted to wait a few more weeks to see if things continue to get better  He should continue with hydrocodone, continue walking with his Cam Walker and pain followup in 4 weeks reassess posterior tibial tendon dysfunction

## 2012-08-13 ENCOUNTER — Other Ambulatory Visit: Payer: Self-pay | Admitting: *Deleted

## 2012-08-19 ENCOUNTER — Ambulatory Visit (HOSPITAL_COMMUNITY)
Admission: RE | Admit: 2012-08-19 | Discharge: 2012-08-19 | Disposition: A | Discharge status: Home / Self Care | Payer: Medicare Other | Source: Ambulatory Visit | Attending: Anesthesiology | Admitting: Anesthesiology

## 2012-08-19 ENCOUNTER — Encounter (HOSPITAL_COMMUNITY)
Admission: RE | Admit: 2012-08-19 | Discharge: 2012-08-19 | Disposition: A | Discharge status: Home / Self Care | Payer: Medicare Other | Source: Ambulatory Visit | Attending: Vascular Surgery | Admitting: Vascular Surgery

## 2012-08-19 ENCOUNTER — Encounter (HOSPITAL_COMMUNITY): Payer: Self-pay

## 2012-08-19 DIAGNOSIS — Z01818 Encounter for other preprocedural examination: Secondary | ICD-10-CM | POA: Insufficient documentation

## 2012-08-19 DIAGNOSIS — Z01812 Encounter for preprocedural laboratory examination: Secondary | ICD-10-CM | POA: Insufficient documentation

## 2012-08-19 DIAGNOSIS — Z01810 Encounter for preprocedural cardiovascular examination: Secondary | ICD-10-CM | POA: Insufficient documentation

## 2012-08-19 LAB — PROTIME-INR: INR: 1.01 (ref 0.00–1.49)

## 2012-08-19 NOTE — Pre-Procedure Instructions (Addendum)
20 Randy [name]  08/19/2012   Your procedure is scheduled on:  08/24/12  Report [facility] at530 AM.  Call this number if you have problems the morning of surgery: [phone]   Remember:   Do not eat food or drink:After Midnight.    Take these medicines the morning of surgery with A SIP OF WATER: labetalol, pain med, allopurinol, amlodipine, clonidine, hydralazine   Do not wear jewelry, .  Do not wear lotions, powders, or perfumes.  Do not shave 48 hours prior to surgery. Men may shave face and neck.  Do not bring valuables to the hospital.  Contacts, dentures or bridgework may not be worn into surgery.  Leave suitcase in the car. After surgery it may be brought to your room.  For patients admitted to the hospital, checkout time is 11:00 AM the day of discharge.   Patients discharged the day of surgery will not be allowed to drive home.  Name and phone number of your driver: *bernice wife [phone]  Special Instructions: Shower using CHG 2 nights before surgery and the night before surgery.  If you shower the day of surgery use CHG.  Use special wash - you have one bottle of CHG for all showers.  You should use approximately 1/3 of the bottle for each shower.   Please read over the following fact sheets that you were given: Pain Booklet, Coughing and Deep Breathing, MRSA Information and Surgical Site Infection Prevention

## 2012-08-23 MED ORDER — DEXTROSE 5 % IV SOLN
1.5000 g | INTRAVENOUS | Status: AC
  Administered 2012-08-24: 1.5 g via INTRAVENOUS
  Filled 2012-08-23: qty 1.5

## 2012-08-24 ENCOUNTER — Ambulatory Visit (HOSPITAL_COMMUNITY)
Admission: RE | Admit: 2012-08-24 | Discharge: 2012-08-24 | Disposition: A | Discharge status: Home / Self Care | Payer: Medicare Other | Source: Ambulatory Visit | Attending: Vascular Surgery | Admitting: Vascular Surgery

## 2012-08-24 ENCOUNTER — Telehealth: Payer: Self-pay | Admitting: Vascular Surgery

## 2012-08-24 ENCOUNTER — Encounter (HOSPITAL_COMMUNITY): Payer: Self-pay | Admitting: Anesthesiology

## 2012-08-24 ENCOUNTER — Encounter (HOSPITAL_COMMUNITY)
Admission: RE | Disposition: A | Discharge status: Home / Self Care | Payer: Self-pay | Source: Ambulatory Visit | Attending: Vascular Surgery

## 2012-08-24 ENCOUNTER — Ambulatory Visit (HOSPITAL_COMMUNITY): Payer: Medicare Other | Admitting: Anesthesiology

## 2012-08-24 ENCOUNTER — Encounter (HOSPITAL_COMMUNITY): Payer: Self-pay | Admitting: *Deleted

## 2012-08-24 DIAGNOSIS — I43 Cardiomyopathy in diseases classified elsewhere: Secondary | ICD-10-CM | POA: Diagnosis present

## 2012-08-24 DIAGNOSIS — E7800 Pure hypercholesterolemia, unspecified: Secondary | ICD-10-CM | POA: Insufficient documentation

## 2012-08-24 DIAGNOSIS — Z841 Family history of disorders of kidney and ureter: Secondary | ICD-10-CM | POA: Insufficient documentation

## 2012-08-24 DIAGNOSIS — N189 Chronic kidney disease, unspecified: Secondary | ICD-10-CM | POA: Insufficient documentation

## 2012-08-24 DIAGNOSIS — I509 Heart failure, unspecified: Secondary | ICD-10-CM | POA: Diagnosis present

## 2012-08-24 DIAGNOSIS — Z800 Family history of malignant neoplasm of digestive organs: Secondary | ICD-10-CM | POA: Insufficient documentation

## 2012-08-24 DIAGNOSIS — J189 Pneumonia, unspecified organism: Principal | ICD-10-CM | POA: Diagnosis present

## 2012-08-24 DIAGNOSIS — Z7982 Long term (current) use of aspirin: Secondary | ICD-10-CM

## 2012-08-24 DIAGNOSIS — R0902 Hypoxemia: Secondary | ICD-10-CM | POA: Diagnosis present

## 2012-08-24 DIAGNOSIS — Z87891 Personal history of nicotine dependence: Secondary | ICD-10-CM | POA: Insufficient documentation

## 2012-08-24 DIAGNOSIS — M109 Gout, unspecified: Secondary | ICD-10-CM | POA: Diagnosis present

## 2012-08-24 DIAGNOSIS — T82898A Other specified complication of vascular prosthetic devices, implants and grafts, initial encounter: Secondary | ICD-10-CM

## 2012-08-24 DIAGNOSIS — Z8261 Family history of arthritis: Secondary | ICD-10-CM | POA: Insufficient documentation

## 2012-08-24 DIAGNOSIS — D631 Anemia in chronic kidney disease: Secondary | ICD-10-CM | POA: Diagnosis present

## 2012-08-24 DIAGNOSIS — Z79899 Other long term (current) drug therapy: Secondary | ICD-10-CM

## 2012-08-24 DIAGNOSIS — J441 Chronic obstructive pulmonary disease with (acute) exacerbation: Secondary | ICD-10-CM | POA: Diagnosis present

## 2012-08-24 DIAGNOSIS — N039 Chronic nephritic syndrome with unspecified morphologic changes: Secondary | ICD-10-CM

## 2012-08-24 DIAGNOSIS — N184 Chronic kidney disease, stage 4 (severe): Secondary | ICD-10-CM | POA: Diagnosis present

## 2012-08-24 DIAGNOSIS — I130 Hypertensive heart and chronic kidney disease with heart failure and stage 1 through stage 4 chronic kidney disease, or unspecified chronic kidney disease: Secondary | ICD-10-CM | POA: Insufficient documentation

## 2012-08-24 DIAGNOSIS — E785 Hyperlipidemia, unspecified: Secondary | ICD-10-CM | POA: Diagnosis present

## 2012-08-24 DIAGNOSIS — I428 Other cardiomyopathies: Secondary | ICD-10-CM | POA: Insufficient documentation

## 2012-08-24 DIAGNOSIS — J9819 Other pulmonary collapse: Secondary | ICD-10-CM | POA: Diagnosis present

## 2012-08-24 DIAGNOSIS — Z992 Dependence on renal dialysis: Secondary | ICD-10-CM | POA: Insufficient documentation

## 2012-08-24 DIAGNOSIS — M1990 Unspecified osteoarthritis, unspecified site: Secondary | ICD-10-CM | POA: Diagnosis present

## 2012-08-24 DIAGNOSIS — M129 Arthropathy, unspecified: Secondary | ICD-10-CM | POA: Diagnosis present

## 2012-08-24 HISTORY — PX: AV FISTULA PLACEMENT: SHX1204

## 2012-08-24 LAB — POCT I-STAT 4, (NA,K, GLUC, HGB,HCT)
Glucose, Bld: 111 mg/dL — ABNORMAL HIGH (ref 70–99)
HCT: 23 % — ABNORMAL LOW (ref 39.0–52.0)
Sodium: 141 mEq/L (ref 135–145)

## 2012-08-24 SURGERY — ARTERIOVENOUS (AV) FISTULA CREATION
Anesthesia: Monitor Anesthesia Care | Site: Arm Lower | Laterality: Left

## 2012-08-24 MED ORDER — SODIUM CHLORIDE 0.9 % IV SOLN
INTRAVENOUS | Status: DC | PRN
  Administered 2012-08-24: 07:00:00 via INTRAVENOUS

## 2012-08-24 MED ORDER — ONDANSETRON HCL 4 MG/2ML IJ SOLN
INTRAMUSCULAR | Status: DC | PRN
  Administered 2012-08-24: 4 mg via INTRAVENOUS

## 2012-08-24 MED ORDER — OXYCODONE HCL 5 MG PO TABS: 5.0000 mg | ORAL_TABLET | Freq: Once | ORAL | Status: DC | PRN

## 2012-08-24 MED ORDER — PROPOFOL INFUSION 10 MG/ML OPTIME
INTRAVENOUS | Status: DC | PRN
  Administered 2012-08-24: 25 ug/kg/min via INTRAVENOUS

## 2012-08-24 MED ORDER — SODIUM CHLORIDE 0.9 % IV SOLN
INTRAVENOUS | Status: DC
Start: 2012-08-24 — End: 2012-08-24

## 2012-08-24 MED ORDER — HYDROMORPHONE HCL PF 1 MG/ML IJ SOLN: 0.2500 mg | INTRAMUSCULAR | Status: DC | PRN

## 2012-08-24 MED ORDER — FENTANYL CITRATE 0.05 MG/ML IJ SOLN
INTRAMUSCULAR | Status: DC | PRN
  Administered 2012-08-24 (×3): 25 ug via INTRAVENOUS

## 2012-08-24 MED ORDER — OXYCODONE HCL 5 MG/5ML PO SOLN: 5.0000 mg | Freq: Once | ORAL | Status: DC | PRN

## 2012-08-24 MED ORDER — PROMETHAZINE HCL 25 MG/ML IJ SOLN: 6.2500 mg | INTRAMUSCULAR | Status: DC | PRN

## 2012-08-24 MED ORDER — OXYCODONE HCL 5 MG PO TABS: 5.0000 mg | ORAL_TABLET | ORAL | Status: DC | PRN

## 2012-08-24 MED ORDER — LIDOCAINE HCL (CARDIAC) 20 MG/ML IV SOLN
INTRAVENOUS | Status: DC | PRN
  Administered 2012-08-24: 30 mg via INTRAVENOUS

## 2012-08-24 MED ORDER — 0.9 % SODIUM CHLORIDE (POUR BTL) OPTIME
TOPICAL | Status: DC | PRN
  Administered 2012-08-24: 1000 mL

## 2012-08-24 MED ORDER — SODIUM CHLORIDE 0.9 % IR SOLN
Status: DC | PRN
  Administered 2012-08-24: 08:00:00

## 2012-08-24 MED ORDER — LIDOCAINE-EPINEPHRINE 0.5 %-1:200000 IJ SOLN
INTRAMUSCULAR | Status: DC | PRN
  Administered 2012-08-24: 2 mL via INTRADERMAL

## 2012-08-24 MED ORDER — MIDAZOLAM HCL 5 MG/5ML IJ SOLN
INTRAMUSCULAR | Status: DC | PRN
  Administered 2012-08-24: 2 mg via INTRAVENOUS

## 2012-08-24 SURGICAL SUPPLY — 31 items
BENZOIN TINCTURE PRP APPL 2/3 (GAUZE/BANDAGES/DRESSINGS) ×2 IMPLANT
CANISTER SUCTION 2500CC (MISCELLANEOUS) ×2 IMPLANT
CLIP LIGATING EXTRA MED SLVR (CLIP) ×2 IMPLANT
CLIP LIGATING EXTRA SM BLUE (MISCELLANEOUS) ×2 IMPLANT
CLOTH BEACON ORANGE TIMEOUT ST (SAFETY) ×2 IMPLANT
CLSR STERI-STRIP ANTIMIC 1/2X4 (GAUZE/BANDAGES/DRESSINGS) ×2 IMPLANT
COVER PROBE W GEL 5X96 (DRAPES) ×2 IMPLANT
COVER SURGICAL LIGHT HANDLE (MISCELLANEOUS) ×2 IMPLANT
DECANTER SPIKE VIAL GLASS SM (MISCELLANEOUS) ×2 IMPLANT
ELECT REM PT RETURN 9FT ADLT (ELECTROSURGICAL) ×2
ELECTRODE REM PT RTRN 9FT ADLT (ELECTROSURGICAL) ×1 IMPLANT
GEL ULTRASOUND 20GR AQUASONIC (MISCELLANEOUS) IMPLANT
GLOVE BIOGEL PI IND STRL 6.5 (GLOVE) ×2 IMPLANT
GLOVE BIOGEL PI INDICATOR 6.5 (GLOVE) ×2
GLOVE SS BIOGEL STRL SZ 7.5 (GLOVE) ×1 IMPLANT
GLOVE SUPERSENSE BIOGEL SZ 7.5 (GLOVE) ×1
GOWN STRL NON-REIN LRG LVL3 (GOWN DISPOSABLE) ×4 IMPLANT
KIT BASIN OR (CUSTOM PROCEDURE TRAY) ×2 IMPLANT
KIT ROOM TURNOVER OR (KITS) ×2 IMPLANT
NS IRRIG 1000ML POUR BTL (IV SOLUTION) ×2 IMPLANT
PACK CV ACCESS (CUSTOM PROCEDURE TRAY) ×2 IMPLANT
PAD ARMBOARD 7.5X6 YLW CONV (MISCELLANEOUS) ×4 IMPLANT
SPONGE GAUZE 4X4 12PLY (GAUZE/BANDAGES/DRESSINGS) ×2 IMPLANT
STRIP CLOSURE SKIN 1/2X4 (GAUZE/BANDAGES/DRESSINGS) ×2 IMPLANT
SUT PROLENE 6 0 CC (SUTURE) ×2 IMPLANT
SUT VIC AB 3-0 SH 27 (SUTURE) ×1
SUT VIC AB 3-0 SH 27X BRD (SUTURE) ×1 IMPLANT
TOWEL OR 17X24 6PK STRL BLUE (TOWEL DISPOSABLE) ×2 IMPLANT
TOWEL OR 17X26 10 PK STRL BLUE (TOWEL DISPOSABLE) ×2 IMPLANT
UNDERPAD 30X30 INCONTINENT (UNDERPADS AND DIAPERS) ×2 IMPLANT
WATER STERILE IRR 1000ML POUR (IV SOLUTION) ×2 IMPLANT

## 2012-08-24 NOTE — Anesthesia Postprocedure Evaluation (Signed)
Anesthesia Post Note  Patient: Randy [name]  Procedure(s) Performed: Procedure(s) (LRB): ARTERIOVENOUS (AV) FISTULA CREATION (Left)  Anesthesia type: MAC  Patient location: PACU  Post pain: Pain level controlled  Post assessment: Patient's Cardiovascular Status Stable  Last Vitals:  Filed Vitals:   08/24/12 1000  BP: 139/68  Pulse: 65  Temp:   Resp: 13    Post vital signs: Reviewed and stable  Level of consciousness: sedated  Complications: No apparent anesthesia complications

## 2012-08-24 NOTE — Op Note (Signed)
OPERATIVE REPORT  DATE OF SURGERY: 08/24/2012  PATIENT: Randy [name], 78 y.o. male MRN: [id number]  DOB: [birth year]  PRE-OPERATIVE DIAGNOSIS: Chronic renal insufficiency  POST-OPERATIVE DIAGNOSIS:  Same  PROCEDURE: Left Cimino AV fistula  SURGEON:  Gretta Began, M.D.  PHYSICIAN ASSISTANT:  none  ANESTHESIA:  Local with sedation  EBL: Minimal ml  Total I/O In: 100 [I.V.:100] Out: -   BLOOD ADMINISTERED: None  DRAINS: None  SPECIMEN: None  COUNTS CORRECT:  YES  PLAN OF CARE: PACU   PATIENT DISPOSITION:  PACU - hemodynamically stable  PROCEDURE DETAILS: The patient was taken to the operating room placed supine position where the area of the left arm and left hand were prepped and draped in usual sterile fashion. The cephalic vein was imaged with ultrasound. This revealed patency throughout its course which was documented with preoperative vascular lab as well. Using local anesthesia incision was made between the level of the cephalic vein and the radial artery at the wrist. The cephalic vein branches were ligated with 30 and 4-0 silk ties and divided. The vein was ligated distally and divided and was mobilized to the level of the radial artery. The radial artery was of excellent size. The artery was occluded proximal and distally was opened 11 blade and sent longitudinally with Potts scissors. The vein was cut to appropriate length was spatulated and sewn end-to-side to the artery with a running 6-0 Prolene suture. Clamps removed and excellent thrill was noted. Wound irrigated with saline. Incision tablet cautery. The wounds were closed with 3-0 Vicryl in the subcutaneous and subcuticular tissue. Benzoin Steri-Strips were applied.   Gretta Began, M.D. 08/24/2012 9:14 AM

## 2012-08-24 NOTE — Telephone Encounter (Signed)
Message copied by Fredrich Birks on Mon Aug 24, 2012 12:00 PM ------      Message from: Melene Plan      Created: Mon Aug 24, 2012  9:40 AM                   ----- Message -----         From: [place], [place]         Sent: 08/24/2012   8:34 AM           To: Melene Plan, RN            4 week F/U - AVF-Early

## 2012-08-24 NOTE — Transfer of Care (Signed)
Immediate Anesthesia Transfer of Care Note  Patient: Randy [name]  Procedure(s) Performed: Procedure(s) (LRB) with comments: ARTERIOVENOUS (AV) FISTULA CREATION (Left)  Patient Location: PACU  Anesthesia Type:MAC  Level of Consciousness: awake, alert  and oriented  Airway & Oxygen Therapy: Patient Spontanous Breathing and Patient connected to nasal cannula oxygen  Post-op Assessment: Report given to PACU RN, Post -op Vital signs reviewed and stable and Patient moving all extremities  Post vital signs: Reviewed and stable  Complications: No apparent anesthesia complications

## 2012-08-24 NOTE — H&P (View-Only) (Signed)
[facility] [place]   Patient name: Randy [name] MRN: [id number] DOB: [birth year] Sex: male   Referred by: Kristian Covey  Reason for referral:  Chief Complaint  Patient presents with  . New Evaluation    eval for access placement/ Dr. Kristian Covey    HISTORY OF PRESENT ILLNESS: Patient is a 78 year old gentleman with progressive renal insufficiency. He was recently hospitalized with congestive heart failure and recently renal function. We are seeing him for discussion of AV access. I do have medical records from his outpatient workup. Most recent creatinine ICD is in the 3.5 range. He does not have any uremic symptoms. He has never been on hemodialysis.  Past Medical History  Diagnosis Date  . Hypertensive heart disease   . Hypercholesterolemia   . CKD (chronic kidney disease) stage 4, GFR 15-29 ml/min   . Gout   . Secondary cardiomyopathy     LVEF 40-45%  . Anemia of chronic disease     Past Surgical History  Procedure Date  . Knee surgery 2007    Right knee  . Back surgery 2004  . Hemorrhoid surgery 40 years ago  . Ganglion cyst excision 01/03/2012    Procedure: REMOVAL GANGLION OF WRIST;  Surgeon: Marlane Hatcher, MD;  Location: [facility];  Service: General;  Laterality: Right;    History   Social History  . Marital Status: Married    Spouse Name: N/A    Number of Children: N/A  . Years of Education: 10   Occupational History  .     Social History Main Topics  . Smoking status: Former Smoker -- 1.0 packs/day for 30 years    Types: Cigarettes  . Smokeless tobacco: Former Neurosurgeon    Quit date: 12/31/1993  . Alcohol Use: No  . Drug Use: No  . Sexually Active: Yes    Birth Control/ Protection: None   Other Topics Concern  . Not on file   Social History Narrative  . No narrative on file    Family History  Problem Relation Age of Onset  . Arthritis    . Cancer    . Kidney disease    . Anesthesia problems Neg Hx   . Hypotension Neg Hx   .  Malignant hyperthermia Neg Hx   . Pseudochol deficiency Neg Hx   . Cancer Sister   . Cancer Brother     colon  . Cancer Brother     colon    Allergies as of 07/28/2012  . (No Known Allergies)    Current Outpatient Prescriptions on File Prior to Visit  Medication Sig Dispense Refill  . allopurinol (ZYLOPRIM) 300 MG tablet Take 300 mg by mouth every morning.       Marland Kitchen amLODipine (NORVASC) 10 MG tablet Take 10 mg by mouth every morning.       Marland Kitchen aspirin EC 81 MG tablet Take 81 mg by mouth every 7 (seven) days.      . calcitRIOL (ROCALTROL) 0.5 MCG capsule Take 1 capsule (0.5 mcg total) by mouth daily.  30 capsule  3  . cloNIDine (CATAPRES) 0.1 MG tablet Take 0.1 mg by mouth 2 (two) times daily.      Marland Kitchen epoetin alfa (EPOGEN,PROCRIT) 40981 UNIT/ML injection Inject 0.6 mLs (6,000 Units total) into the skin every 14 (fourteen) days.  1 mL  3  . hydrALAZINE (APRESOLINE) 50 MG tablet Take 1 tablet (50 mg total) by mouth 3 (three) times daily.  90 tablet  3  .  HYDROcodone-acetaminophen (NORCO) 5-325 MG per tablet Take 1 tablet by mouth every 6 (six) hours as needed for pain.  30 tablet  0  . labetalol (NORMODYNE) 200 MG tablet Take 2 tablets (400 mg total) by mouth 2 (two) times daily.  120 tablet  6  . Multiple Vitamin (MULITIVITAMIN WITH MINERALS) TABS Take 1 tablet by mouth daily.      . PredniSONE 10 MG KIT Take 1 kit (10 mg total) by mouth as directed.  1 kit  0  . simvastatin (ZOCOR) 80 MG tablet Take 80 mg by mouth at bedtime.        . Vitamin D, Ergocalciferol, (DRISDOL) 50000 UNITS CAPS Take 1 capsule (50,000 Units total) by mouth every 7 (seven) days.  4 capsule  3     REVIEW OF SYSTEMS:  Positives indicated with an "X"  CARDIOVASCULAR:  [ ]  chest pain   [ ]  chest pressure   [ ]  palpitations   [x ] orthopnea   [ ]  dyspnea on exertion   [ ]  claudication   [ ]  rest pain   [ ]  DVT   [ ]  phlebitis PULMONARY:   [ ]  productive cough   [ ]  asthma   x ] wheezing NEUROLOGIC:   [ ]  weakness   [ ]  paresthesias  [ ]  aphasia  [ ]  amaurosis  [ ]  dizziness HEMATOLOGIC:   [ ]  bleeding problems   [ ]  clotting disorders MUSCULOSKELETAL:  [ ]  joint pain   [ ]  joint swelling GASTROINTESTINAL: [ ]   blood in stool  [ ]   hematemesis GENITOURINARY:  [ ]   dysuria  [ ]   hematuria PSYCHIATRIC:  [ ]  history of major depression INTEGUMENTARY:  [ ]  rashes  [ ]  ulcers CONSTITUTIONAL:  [ ]  fever   [ ]  chills  PHYSICAL EXAMINATION:  General: The patient is a well-nourished male, in no acute distress. Vital signs are BP 170/87  Pulse 73  Ht 5\' 9"  (1.753 m)  Wt 189 lb (85.73 kg)  BMI 27.91 kg/m2  SpO2 100% Pulmonary: There is a good air exchange bilaterally without wheezing or rales. Abdomen: Soft and non-tender with normal pitch bowel sounds. Musculoskeletal: There are no major deformities.  There is no significant extremity pain. Neurologic: No focal weakness or paresthesias are detected, Skin: There are no ulcer or rashes noted. Psychiatric: The patient has normal affect. Cardiovascular: There is a regular rate and rhythm without significant murmur appreciated. 2+ radial pulses bilaterally  [facility] Vascular Lab Studies:  Ordered and Independently Reviewed vein map reveals patent cephalic veins bilaterally. Better size on the left than on the right.  Impression and Plan:  Chronic renal insufficiency. I had a long discussion with the patient and his wife present. I explained our roll and providing access. I discussed hemodialysis catheter for acute access which he does not need. I discussed AV fistula an AV graft. I do feel that he is an acceptable cephalic vein for AV fistula attempt. We have scheduled this at his convenience as an outpatient at [facility] on 08/05/12    EARLY, TODD [facility] [place] Office: [phone]

## 2012-08-24 NOTE — Telephone Encounter (Signed)
Could not reach patient, sent letter, dpm

## 2012-08-24 NOTE — Interval H&P Note (Signed)
History and Physical Interval Note:  08/24/2012 7:25 AM  Randy [name]  has presented today for surgery, with the diagnosis of ESRD  The various methods of treatment have been discussed with the patient and family. After consideration of risks, benefits and other options for treatment, the patient has consented to  Procedure(s) (LRB) with comments: ARTERIOVENOUS (AV) FISTULA CREATION (Left) as a surgical intervention .  [name] history has been reviewed, patient examined, no change in status, stable for surgery.  I have reviewed the patient's chart and labs.  Questions were answered to the patient's satisfaction.     EARLY, TODD

## 2012-08-24 NOTE — Anesthesia Preprocedure Evaluation (Addendum)
Anesthesia Evaluation    Airway       Dental   Pulmonary shortness of breath and with exertion,          Cardiovascular hypertension, Pt. on medications +CHF     Neuro/Psych negative neurological ROS     GI/Hepatic negative GI ROS, Neg liver ROS,   Endo/Other  negative endocrine ROS  Renal/GU Renal disease     Musculoskeletal   Abdominal   Peds  Hematology   Anesthesia Other Findings   Reproductive/Obstetrics                           Anesthesia Physical Anesthesia Plan  ASA: III  Anesthesia Plan: MAC   Post-op Pain Management:    Induction: Intravenous  Airway Management Planned: Natural Airway, Nasal Cannula and Simple Face Mask  Additional Equipment:   Intra-op Plan:   Post-operative Plan:   Informed Consent: I have reviewed the patients History and Physical, chart, labs and discussed the procedure including the risks, benefits and alternatives for the proposed anesthesia with the patient or authorized representative who has indicated his/her understanding and acceptance.   Dental advisory given  Plan Discussed with: CRNA and Anesthesiologist  Anesthesia Plan Comments:         Anesthesia Quick Evaluation

## 2012-08-25 ENCOUNTER — Encounter (HOSPITAL_COMMUNITY): Payer: Self-pay | Admitting: Vascular Surgery

## 2012-08-26 ENCOUNTER — Emergency Department (HOSPITAL_COMMUNITY): Payer: Medicare Other

## 2012-08-26 ENCOUNTER — Encounter (HOSPITAL_COMMUNITY): Payer: Self-pay | Admitting: *Deleted

## 2012-08-26 ENCOUNTER — Inpatient Hospital Stay (HOSPITAL_COMMUNITY)
Admission: EM | Admit: 2012-08-26 | Discharge: 2012-08-29 | DRG: 982 | Disposition: A | Discharge status: Home Health | Payer: Medicare Other | Attending: Family Medicine | Admitting: Family Medicine

## 2012-08-26 DIAGNOSIS — J441 Chronic obstructive pulmonary disease with (acute) exacerbation: Secondary | ICD-10-CM

## 2012-08-26 DIAGNOSIS — N186 End stage renal disease: Secondary | ICD-10-CM

## 2012-08-26 DIAGNOSIS — J189 Pneumonia, unspecified organism: Secondary | ICD-10-CM

## 2012-08-26 DIAGNOSIS — D649 Anemia, unspecified: Secondary | ICD-10-CM

## 2012-08-26 DIAGNOSIS — I429 Cardiomyopathy, unspecified: Secondary | ICD-10-CM

## 2012-08-26 DIAGNOSIS — I119 Hypertensive heart disease without heart failure: Secondary | ICD-10-CM

## 2012-08-26 LAB — CBC WITH DIFFERENTIAL/PLATELET
Basophils Absolute: 0 10*3/uL (ref 0.0–0.1)
Basophils Relative: 0 % (ref 0–1)
Hemoglobin: 8 g/dL — ABNORMAL LOW (ref 13.0–17.0)
MCHC: 31.7 g/dL (ref 30.0–36.0)
Neutro Abs: 4.6 10*3/uL (ref 1.7–7.7)
Neutrophils Relative %: 75 % (ref 43–77)
Platelets: 284 10*3/uL (ref 150–400)
RDW: 16.2 % — ABNORMAL HIGH (ref 11.5–15.5)

## 2012-08-26 LAB — BASIC METABOLIC PANEL
Chloride: 106 mEq/L (ref 96–112)
GFR calc Af Amer: 15 mL/min — ABNORMAL LOW (ref >90)
GFR calc non Af Amer: 13 mL/min — ABNORMAL LOW (ref >90)
Potassium: 4.2 mEq/L (ref 3.5–5.1)
Sodium: 137 mEq/L (ref 135–145)

## 2012-08-26 LAB — BLOOD GAS, ARTERIAL
O2 Saturation: 98.3 %
Patient temperature: 37
pH, Arterial: 7.291 — ABNORMAL LOW (ref 7.350–7.450)

## 2012-08-26 LAB — RETICULOCYTES
RBC.: 2.91 MIL/uL — ABNORMAL LOW (ref 4.22–5.81)
Retic Count, Absolute: 29.1 10*3/uL (ref 19.0–186.0)

## 2012-08-26 LAB — TROPONIN I: Troponin I: <0.3 ng/mL (ref <0.30)

## 2012-08-26 LAB — D-DIMER, QUANTITATIVE: D-Dimer, Quant: 1.95 ug/mL-FEU — ABNORMAL HIGH (ref 0.00–0.48)

## 2012-08-26 MED ORDER — METHYLPREDNISOLONE SODIUM SUCC 125 MG IJ SOLR
125.0000 mg | Freq: Three times a day (TID) | INTRAMUSCULAR | Status: DC
  Administered 2012-08-26 – 2012-08-28 (×7): 125 mg via INTRAVENOUS
  Filled 2012-08-26 (×7): qty 2

## 2012-08-26 MED ORDER — LORAZEPAM 0.5 MG PO TABS: 0.5000 mg | ORAL_TABLET | Freq: Four times a day (QID) | ORAL | Status: DC | PRN

## 2012-08-26 MED ORDER — PIPERACILLIN-TAZOBACTAM 3.375 G IVPB 30 MIN: 3.3750 g | Freq: Three times a day (TID) | INTRAVENOUS | Status: DC

## 2012-08-26 MED ORDER — IPRATROPIUM BROMIDE 0.02 % IN SOLN: 0.5000 mg | RESPIRATORY_TRACT | Status: DC | PRN

## 2012-08-26 MED ORDER — VANCOMYCIN HCL IN DEXTROSE 1-5 GM/200ML-% IV SOLN
1000.0000 mg | Freq: Once | INTRAVENOUS | Status: AC
  Administered 2012-08-26: 1000 mg via INTRAVENOUS
  Filled 2012-08-26: qty 200

## 2012-08-26 MED ORDER — LABETALOL HCL 200 MG PO TABS
300.0000 mg | ORAL_TABLET | Freq: Two times a day (BID) | ORAL | Status: DC
  Administered 2012-08-26: 300 mg via ORAL
  Administered 2012-08-27: 200 mg via ORAL
  Administered 2012-08-27 – 2012-08-29 (×4): 300 mg via ORAL
  Filled 2012-08-26: qty 2
  Filled 2012-08-26: qty 1
  Filled 2012-08-26 (×4): qty 2
  Filled 2012-08-26: qty 1

## 2012-08-26 MED ORDER — IPRATROPIUM BROMIDE 0.02 % IN SOLN
0.5000 mg | Freq: Four times a day (QID) | RESPIRATORY_TRACT | Status: DC
  Administered 2012-08-26 – 2012-08-29 (×11): 0.5 mg via RESPIRATORY_TRACT
  Filled 2012-08-26 (×12): qty 2.5

## 2012-08-26 MED ORDER — IPRATROPIUM-ALBUTEROL 0.5-2.5 (3) MG/3ML IN SOLN: 3.0000 mL | RESPIRATORY_TRACT | Status: DC | PRN

## 2012-08-26 MED ORDER — ALBUTEROL SULFATE (5 MG/ML) 0.5% IN NEBU
5.0000 mg | INHALATION_SOLUTION | Freq: Once | RESPIRATORY_TRACT | Status: AC
  Administered 2012-08-26: 5 mg via RESPIRATORY_TRACT
  Filled 2012-08-26: qty 1

## 2012-08-26 MED ORDER — ALBUTEROL SULFATE (5 MG/ML) 0.5% IN NEBU: 2.5000 mg | INHALATION_SOLUTION | RESPIRATORY_TRACT | Status: DC | PRN

## 2012-08-26 MED ORDER — ALBUTEROL SULFATE (5 MG/ML) 0.5% IN NEBU: 5.0000 mg | INHALATION_SOLUTION | Freq: Once | RESPIRATORY_TRACT | Status: DC

## 2012-08-26 MED ORDER — SIMVASTATIN 20 MG PO TABS
80.0000 mg | ORAL_TABLET | Freq: Every day | ORAL | Status: DC
  Administered 2012-08-26: 80 mg via ORAL
  Filled 2012-08-26: qty 4

## 2012-08-26 MED ORDER — PIPERACILLIN-TAZOBACTAM 3.375 G IVPB
3.3750 g | Freq: Once | INTRAVENOUS | Status: AC
  Administered 2012-08-26: 3.375 g via INTRAVENOUS
  Filled 2012-08-26: qty 50

## 2012-08-26 MED ORDER — CLONIDINE HCL 0.2 MG PO TABS
0.2000 mg | ORAL_TABLET | Freq: Two times a day (BID) | ORAL | Status: DC
  Administered 2012-08-26 – 2012-08-29 (×6): 0.2 mg via ORAL
  Filled 2012-08-26 (×6): qty 1

## 2012-08-26 MED ORDER — ENOXAPARIN SODIUM 30 MG/0.3ML ~~LOC~~ SOLN
30.0000 mg | Freq: Every day | SUBCUTANEOUS | Status: DC
  Administered 2012-08-26 – 2012-08-29 (×4): 30 mg via SUBCUTANEOUS
  Filled 2012-08-26 (×4): qty 0.3

## 2012-08-26 MED ORDER — ALLOPURINOL 300 MG PO TABS
300.0000 mg | ORAL_TABLET | Freq: Every day | ORAL | Status: DC
  Administered 2012-08-28 – 2012-08-29 (×2): 300 mg via ORAL
  Filled 2012-08-26 (×2): qty 1

## 2012-08-26 MED ORDER — IPRATROPIUM BROMIDE 0.02 % IN SOLN
0.5000 mg | Freq: Once | RESPIRATORY_TRACT | Status: AC
  Administered 2012-08-26: 0.5 mg via RESPIRATORY_TRACT
  Filled 2012-08-26: qty 2.5

## 2012-08-26 MED ORDER — METHYLPREDNISOLONE SODIUM SUCC 125 MG IJ SOLR: 125.0000 mg | Freq: Once | INTRAMUSCULAR | Status: DC

## 2012-08-26 MED ORDER — METHYLPREDNISOLONE SODIUM SUCC 125 MG IJ SOLR
125.0000 mg | Freq: Once | INTRAMUSCULAR | Status: AC
  Administered 2012-08-26: 125 mg via INTRAVENOUS
  Filled 2012-08-26: qty 2

## 2012-08-26 MED ORDER — AMLODIPINE BESYLATE 5 MG PO TABS
10.0000 mg | ORAL_TABLET | Freq: Every day | ORAL | Status: DC
  Administered 2012-08-27 – 2012-08-29 (×3): 10 mg via ORAL
  Filled 2012-08-26 (×2): qty 1
  Filled 2012-08-26 (×2): qty 2

## 2012-08-26 MED ORDER — ALBUTEROL SULFATE (5 MG/ML) 0.5% IN NEBU
2.5000 mg | INHALATION_SOLUTION | Freq: Four times a day (QID) | RESPIRATORY_TRACT | Status: DC
  Administered 2012-08-26 – 2012-08-29 (×11): 2.5 mg via RESPIRATORY_TRACT
  Filled 2012-08-26 (×12): qty 0.5

## 2012-08-26 MED ORDER — VANCOMYCIN HCL IN DEXTROSE 1-5 GM/200ML-% IV SOLN
1000.0000 mg | INTRAVENOUS | Status: DC
  Administered 2012-08-28: 1000 mg via INTRAVENOUS
  Filled 2012-08-26 (×2): qty 200

## 2012-08-26 MED ORDER — PIPERACILLIN-TAZOBACTAM IN DEX 2-0.25 GM/50ML IV SOLN
2.2500 g | Freq: Three times a day (TID) | INTRAVENOUS | Status: DC
  Administered 2012-08-26 – 2012-08-29 (×9): 2.25 g via INTRAVENOUS
  Filled 2012-08-26 (×15): qty 50

## 2012-08-26 NOTE — H&P (Signed)
[id number] 

## 2012-08-26 NOTE — ED Notes (Signed)
Reports increased sob that started around 0500 this morning.  Denies cough/fever/cp.

## 2012-08-26 NOTE — ED Notes (Signed)
rn attempted to call report, per secretary nurse is on lunch, unable to give time of return. Will return call.

## 2012-08-26 NOTE — Progress Notes (Signed)
ANTIBIOTIC CONSULT NOTE - INITIAL  Pharmacy Consult for Vancomycin Indication: rule out pneumonia  No Known Allergies  Patient Measurements: Height: 5\' 8"  (172.7 cm) Weight: 185 lb (83.915 kg) IBW/kg (Calculated) : 68.4   Vital Signs: Temp: 97.7 F (36.5 C) (12/04 0848) Temp src: Oral (12/04 0848) BP: 142/76 mmHg (12/04 1106) Pulse Rate: 68  (12/04 1106) Intake/Output from previous day:   Intake/Output from this shift:    Labs:  Basename 08/26/12 0826 08/24/12 0703  WBC 6.1 --  HGB 8.0* 7.8*  PLT 284 --  LABCREA -- --  CREATININE 3.97* --   Estimated Creatinine Clearance: 16.2 ml/min (by C-G formula based on Cr of 3.97). No results found for this basename: VANCOTROUGH:2,VANCOPEAK:2,VANCORANDOM:2,GENTTROUGH:2,GENTPEAK:2,GENTRANDOM:2,TOBRATROUGH:2,TOBRAPEAK:2,TOBRARND:2,AMIKACINPEAK:2,AMIKACINTROU:2,AMIKACIN:2, in the last 72 hours   Microbiology: Recent Results (from the past 720 hour(s))  SURGICAL PCR SCREEN     Status: Normal   Collection Time   07/30/12  9:35 AM      Component Value Range Status Comment   MRSA, PCR NEGATIVE  NEGATIVE Final    Staphylococcus aureus NEGATIVE  NEGATIVE Final   SURGICAL PCR SCREEN     Status: Normal   Collection Time   08/19/12 11:24 AM      Component Value Range Status Comment   MRSA, PCR NEGATIVE  NEGATIVE Final    Staphylococcus aureus NEGATIVE  NEGATIVE Final     Medical History: Past Medical History  Diagnosis Date  . Hypertensive heart disease   . Hypercholesterolemia   . CKD (chronic kidney disease) stage 4, GFR 15-29 ml/min   . Gout   . Secondary cardiomyopathy     LVEF 40-45%  . Anemia of chronic disease   . Hypertension   . CHF (congestive heart failure)   . Shortness of breath   . Arthritis     Medications:  Scheduled:    . [COMPLETED] albuterol  5 mg Nebulization Once  . [COMPLETED] albuterol  5 mg Nebulization Once  . allopurinol  300 mg Oral Daily  . amLODipine  10 mg Oral Daily  . cloNIDine  0.2 mg  Oral BID  . [COMPLETED] ipratropium  0.5 mg Nebulization Once  . labetalol  300 mg Oral BID  . [COMPLETED] methylPREDNISolone sodium succinate  125 mg Intravenous Once  . methylPREDNISolone sodium succinate  125 mg Intravenous Once  . methylPREDNISolone sodium succinate  125 mg Intravenous Q8H  . simvastatin  80 mg Oral q1800  . [DISCONTINUED] albuterol  5 mg Nebulization Once   Assessment: Okay for Protocol Estimated Creatinine Clearance: 16.2 ml/min (by C-G formula based on Cr of 3.97). Received initial Vancomycin and Zosyn doses in ED.  Goal of Therapy:  Vancomycin trough level 15-20 mcg/ml  Plan:  Vancomycin 1000mg  IV every 48 hours. Measure antibiotic drug levels at steady state Follow up culture results  Note: Adjusted Zosyn for renal dysfunction. Lovenox for VTE Prophylaxis ordered (also adjusted for renal dysfunction).  Lamonte Richer R 08/26/2012,12:12 PM

## 2012-08-26 NOTE — ED Notes (Signed)
Report called to judy rn

## 2012-08-26 NOTE — ED Notes (Signed)
Dr.dondiego to see pt, admit orders placed

## 2012-08-26 NOTE — ED Notes (Signed)
Pt given meal tray prior to departure

## 2012-08-26 NOTE — ED Notes (Signed)
Pt reports waking around 5am sob. Just had dialysis shunt placed this week, denies any cp, no vomiting or diarrhea. No cough or congestion. sats at arrival were mid to upper 80's, placed on 02 at 3l, sats increased to 100%.  Pt on all monitors, awaiting resp therapy for bipap.

## 2012-08-26 NOTE — ED Provider Notes (Signed)
History     CSN: [id number]  Arrival date & time 08/26/12  4098   First MD Initiated Contact with Patient 08/26/12 [phone]      Chief Complaint  Patient presents with  . Shortness of Breath    (Consider location/radiation/quality/duration/timing/severity/associated sxs/prior treatment) HPI Comments: Patient with a history of CHF chronic shortness of breath, and stage IV kidney disease , who is not yet on dialysis complains of sudden onset of shortness of breath at 5 AM this morning. He states that he feels more comfortable sitting up.  He denies cough, fever, chills, wheezing, or chest pain. He also denies any recent changes in his medications. He states he has not seen his primary care physician in approximately 2 weeks. He also denies lower extremity edema. Patient states he is not on any oxygen therapies at home.  Patient had a left-sided AV fistula placed on 08/24/2012  Patient's primary care physician is Dr. Delbert Harness, cardiologist is Dr. Dietrich Pates  Patient is a 78 y.o. male presenting with shortness of breath. The history is provided by the patient.  Shortness of Breath  The current episode started today. The onset was sudden. The problem occurs continuously. The problem has been gradually worsening. The problem is moderate. Nothing relieves the symptoms. The symptoms are aggravated by a supine position. Associated symptoms include shortness of breath. Pertinent negatives include no chest pain, no chest pressure, no fever, no rhinorrhea, no stridor, no cough and no wheezing. There was no intake of a foreign body. He was not exposed to toxic fumes. He has not inhaled smoke recently. He has had no prior steroid use. He has had prior hospitalizations. Past medical history comments: CHF,. There were no sick contacts. He has received no recent medical care.    Past Medical History  Diagnosis Date  . Hypertensive heart disease   . Hypercholesterolemia   . CKD (chronic kidney disease) stage  4, GFR 15-29 ml/min   . Gout   . Secondary cardiomyopathy     LVEF 40-45%  . Anemia of chronic disease   . Hypertension   . CHF (congestive heart failure)   . Shortness of breath   . Arthritis     Past Surgical History  Procedure Date  . Knee surgery 2007    Right knee, arthroscopy  . Back surgery 2004    lumbar  . Hemorrhoid surgery 40 years ago  . Ganglion cyst excision 01/03/2012    Procedure: REMOVAL GANGLION OF WRIST;  Surgeon: Marlane Hatcher, MD;  Location: [facility];  Service: General;  Laterality: Right;  . Colonscopy   . Av fistula placement 08/24/2012    Procedure: ARTERIOVENOUS (AV) FISTULA CREATION;  Surgeon: Larina Earthly, MD;  Location: [facility] OR;  Service: Vascular;  Laterality: Left;    Family History  Problem Relation Age of Onset  . Arthritis    . Cancer    . Kidney disease    . Anesthesia problems Neg Hx   . Hypotension Neg Hx   . Malignant hyperthermia Neg Hx   . Pseudochol deficiency Neg Hx   . Cancer Sister   . Cancer Brother     colon  . Cancer Brother     colon    History  Substance Use Topics  . Smoking status: Former Smoker -- 1.0 packs/day for 30 years    Types: Cigarettes    Quit date: 08/19/1998  . Smokeless tobacco: Former Neurosurgeon    Quit date: 12/31/1993  Comment: quit cigs in 1999  . Alcohol Use: No      Review of Systems  Constitutional: Negative for fever, chills, activity change and appetite change.  HENT: Negative for congestion, rhinorrhea and neck pain.   Respiratory: Positive for chest tightness and shortness of breath. Negative for cough, wheezing and stridor.   Cardiovascular: Negative for chest pain.  Gastrointestinal: Negative for nausea, vomiting and abdominal pain.  Genitourinary: Negative for flank pain.  Musculoskeletal: Negative for back pain, joint swelling and arthralgias.  Skin: Negative for color change.  Neurological: Negative for dizziness, weakness, numbness and headaches.  Psychiatric/Behavioral:  Negative for confusion and decreased concentration.  All other systems reviewed and are negative.    Allergies  Review of patient's allergies indicates no known allergies.  Home Medications   Current Outpatient Rx  Name  Route  Sig  Dispense  Refill  . ALLOPURINOL 300 MG PO TABS   Oral   Take 300 mg by mouth every morning.          Marland Kitchen AMLODIPINE BESYLATE 10 MG PO TABS   Oral   Take 10 mg by mouth every morning.          . ASPIRIN EC 81 MG PO TBEC   Oral   Take 81 mg by mouth every 7 (seven) days.         Marland Kitchen CALCITRIOL 0.5 MCG PO CAPS   Oral   Take 1 capsule (0.5 mcg total) by mouth daily.   30 capsule   3   . CLONIDINE HCL 0.1 MG PO TABS   Oral   Take 0.1 mg by mouth 2 (two) times daily.         . FUROSEMIDE 40 MG PO TABS   Oral   Take 40 mg by mouth 2 (two) times daily.         Marland Kitchen HYDRALAZINE HCL 50 MG PO TABS   Oral   Take 1 tablet (50 mg total) by mouth 3 (three) times daily.   90 tablet   3   . LABETALOL HCL 200 MG PO TABS   Oral   Take 2 tablets (400 mg total) by mouth 2 (two) times daily.   120 tablet   6   . ADULT MULTIVITAMIN W/MINERALS CH   Oral   Take 1 tablet by mouth daily.         . OXYCODONE HCL 5 MG PO TABS   Oral   Take 1 tablet (5 mg total) by mouth every 4 (four) hours as needed for pain.   30 tablet   0   . SIMVASTATIN 80 MG PO TABS   Oral   Take 80 mg by mouth at bedtime.           Marland Kitchen VITAMIN D (ERGOCALCIFEROL) 50000 UNITS PO CAPS   Oral   Take 1 capsule (50,000 Units total) by mouth every 7 (seven) days.   4 capsule   3     BP 170/85  Pulse 91  Temp 97.7 F (36.5 C) (Oral)  Resp 24  Ht 5\' 8"  (1.727 m)  Wt 185 lb (83.915 kg)  BMI 28.13 kg/m2  SpO2 98%  Physical Exam  Nursing note and vitals reviewed. Constitutional: He is oriented to person, place, and time. He appears well-developed and well-nourished. No distress.  HENT:  Head: Normocephalic and atraumatic.  Mouth/Throat: Oropharynx is clear and  moist.  Eyes: EOM are normal. Pupils are equal, round, and reactive to light.  Neck: Normal range of motion. Neck supple. No JVD present. No thyromegaly present.  Cardiovascular: Normal rate, regular rhythm, normal heart sounds and intact distal pulses.   No murmur heard. Pulmonary/Chest: He is in respiratory distress. He has decreased breath sounds. He has wheezes. He has no rhonchi. He has no rales. He exhibits no tenderness.       Breathing appears labored with diminished lung sounds bilaterally. Scattered expiratory wheezes are present.  Abdominal: Soft. He exhibits no distension. There is no tenderness. There is no rebound and no guarding.  Musculoskeletal: Normal range of motion.       No pitting edema  Neurological: He is alert and oriented to person, place, and time. He exhibits normal muscle tone. Coordination normal.  Skin: Skin is warm and dry.    ED Course  Procedures (including critical care time)  Results for orders placed during the hospital encounter of 08/26/12  CBC WITH DIFFERENTIAL      Component Value Range   WBC 6.1  4.0 - 10.5 K/uL   RBC 2.83 (*) 4.22 - 5.81 MIL/uL   Hemoglobin 8.0 (*) 13.0 - 17.0 g/dL   HCT 16.1 (*) 09.6 - 04.5 %   MCV 89.0  78.0 - 100.0 fL   MCH 28.3  26.0 - 34.0 pg   MCHC 31.7  30.0 - 36.0 g/dL   RDW 40.9 (*) 81.1 - 91.4 %   Platelets 284  150 - 400 K/uL   Neutrophils Relative 75  43 - 77 %   Neutro Abs 4.6  1.7 - 7.7 K/uL   Lymphocytes Relative 15  12 - 46 %   Lymphs Abs 0.9  0.7 - 4.0 K/uL   Monocytes Relative 8  3 - 12 %   Monocytes Absolute 0.5  0.1 - 1.0 K/uL   Eosinophils Relative 2  0 - 5 %   Eosinophils Absolute 0.1  0.0 - 0.7 K/uL   Basophils Relative 0  0 - 1 %   Basophils Absolute 0.0  0.0 - 0.1 K/uL  BASIC METABOLIC PANEL      Component Value Range   Sodium 137  135 - 145 mEq/L   Potassium 4.2  3.5 - 5.1 mEq/L   Chloride 106  96 - 112 mEq/L   CO2 23  19 - 32 mEq/L   Glucose, Bld 121 (*) 70 - 99 mg/dL   BUN 42 (*) 6 -  23 mg/dL   Creatinine, Ser 7.82 (*) 0.50 - 1.35 mg/dL   Calcium 9.0  8.4 - 95.6 mg/dL   GFR calc non Af Amer 13 (*) >90 mL/min   GFR calc Af Amer 15 (*) >90 mL/min  TROPONIN I      Component Value Range   Troponin I <0.30  <0.30 ng/mL  BLOOD GAS, ARTERIAL      Component Value Range   O2 Content 3.0     Delivery systems NASAL CANNULA     pH, Arterial 7.291 (*) 7.350 - 7.450   pCO2 arterial 48.1 (*) 35.0 - 45.0 mmHg   pO2, Arterial 120.0 (*) 80.0 - 100.0 mmHg   Bicarbonate 22.5  20.0 - 24.0 mEq/L   TCO2 21.7  0 - 100 mmol/L   Acid-base deficit 3.1 (*) 0.0 - 2.0 mmol/L   O2 Saturation 98.3     Patient temperature 37.0     Collection site RIGHT RADIAL     Drawn by [id number]     Sample type ARTERIAL     Allens  test (pass/fail) PASS  PASS  PRO B NATRIURETIC PEPTIDE      Component Value Range   Pro B Natriuretic peptide (BNP) 7721.0 (*) 0 - 450 pg/mL     Dg Chest Portable 1 View  08/26/2012  *RADIOLOGY REPORT*  Clinical Data: Shortness of breath, history hypertension, CHF, chronic kidney disease  PORTABLE CHEST - 1 VIEW  Comparison: Portable exam 0936 hours compared to 08/19/2012  Findings: Enlargement of cardiac silhouette. Calcified tortuous aorta. Pulmonary vascularity normal. Left lower lobe infiltrate question pneumonia. Minimal infiltrate or atelectasis at right base. Upper lungs clear. Cannot exclude small left pleural effusion. No pneumothorax. Bones demineralized.  IMPRESSION: Enlargement of cardiac silhouette. Left lower lobe pneumonia with mild atelectasis versus infiltrate right base. Question small left pleural effusion.   Original Report Authenticated By: Ulyses Southward, M.D.       MDM    Date: 08/26/2012  Rate: 89  Rhythm: normal sinus rhythm  QRS Axis: normal  Intervals: normal  ST/T Wave abnormalities: flatten T waves inferiorly  Conduction Disutrbances:none  Narrative Interpretation: left ventricular hypertrophy  Old EKG Reviewed: unchanged   EKG read by Dr. Charline Bills  knapp  08:58am  Patient has very labored breathing without hypoxia, diminished lung sounds throughout on 3L O2, will order labs, abg, CXR., neb   09:45am  Patient is feeling better after O2.  Considered bipap but pt improved after O2 and albuterol neb. solu-medrol also given.  Patient also seen by EDP.  10:40 am labs and XR reviewed, discussed findings with EDP.  Last admission was 06/23/12, so will order vancomycin and Zosyn.  Will consult Dr. Janna Arch for admission   11:12am Consulted Dr. Janna Arch.  Will come to ED to admit patient ,place orders and bed request  Umi Mainor L. Markesha Hannig, PA 08/26/12 1113  Arjun Hard L. [place], [place] 08/26/12 1117

## 2012-08-26 NOTE — ED Provider Notes (Signed)
This chart was scribed for Ward Givens, MD by Charolett Bumpers, ED Scribe. The patient was seen in room [facility]. Patient's care was started at 0934.  He reports a acute onset of SOB that started this morning around 0500 this morning. He reports a h/o smoking. He denies any cough, fever or chest pain. He states that his legs have been swelling. He had similar symptoms a month ago, he does not know the diagnosis. He had an AV fistula placed in left wrist a few days ago to prepare for dialysis.   09:35-On Exam, the pt has retractions, diminished breath sounds with some expiratory wheezing. No pitting edema.  PCP: DonDiego  Pt started on antibiotics for healthcare associated pneumonia.   Diagnoses that have been ruled out:  None  Diagnoses that are still under consideration:  None  Final diagnoses:  Healthcare-associated pneumonia  COPD exacerbation  Anemia  End stage renal disease   Plan admission  Medical screening examination/treatment/procedure(s) were conducted as a shared visit with non-physician practitioner(s) and myself.  I personally evaluated the patient during the encounter  Devoria Albe, MD, FACEP  CRITICAL CARE Performed by: Devoria Albe L   Total critical care time: 46 min   Critical care time was exclusive of separately billable procedures and treating other patients.  Critical care was necessary to treat or prevent imminent or life-threatening deterioration.  Critical care was time spent personally by me on the following activities: development of treatment plan with patient and/or surrogate as well as nursing, discussions with consultants, evaluation of patient's response to treatment, examination of patient, obtaining history from patient or surrogate, ordering and performing treatments and interventions, ordering and review of laboratory studies, ordering and review of radiographic studies, pulse oximetry and re-evaluation of patient's condition.   I  personally performed the services described in this documentation, which was scribed in my presence. The recorded information has been reviewed and considered.  Devoria Albe, MD, Armando Gang    Ward Givens, MD 08/26/12 1059

## 2012-08-26 NOTE — H&P (Signed)
NAME:  Randy [name], Randy [name] NO.:  [id number]  MEDICAL RECORD NO.:  [id number]  LOCATION:  A328                          FACILITY:  [facility]  PHYSICIAN:  Melvyn Novas, MDDATE OF BIRTH:  09-23-1932  DATE OF ADMISSION:  08/26/2012 DATE OF DISCHARGE:  LH                             HISTORY & PHYSICAL   HISTORY OF PRESENT ILLNESS:  The patient is a 78 year old black male with chronic renal failure, severe hypertension, history of congestive heart failure, who just had an AV fistula placed in anticipation of dialysis.  Last creatinine was 3.54.  The patient complains of increasing cough, sputum, dyspnea, was mildly hypoxic in the ER, given him 2-3 breathing treatments, and seemed to improve.  His admitted chest x-ray reveals left lower lobe infiltrate and possible right lower lobe atelectasis.  He denies anginal pain, orthopnea, PND, cough, sputum or hemoptysis.  Basically, the patient will be placed on vancomycin and Zosyn, monitoring his renal function per pharmacy protocol.  DuoNeb nebulizer and Solu-Medrol for any bronchospastic component.  PAST MEDICAL HISTORY:  Significant for: 1. Hypertension. 2. Chronic renal failure, stage IV. 3. Hyperlipidemia. 4. Gout. 5. Nonischemic cardiomyopathy with an ejection fraction of 40-45%. 6. Anemia of chronic disease due to renal failure. 7. Hypertension. 8. Congestive heart failure. 9. Dyspnea. 10.DJD.  PAST SURGICAL HISTORY:  Remarkable for right knee arthroscopy, hemorrhoid surgery, ganglion cyst excision, AV fistula placement 1 month ago, and colonoscopy 1 year ago.  ALLERGIES:  He has no known allergies.  CURRENT MEDICINES: 1. Allopurinol 300 mg p.o. daily. 2. Norvasc 10 mg p.o. daily. 3. Aspirin 81 mg p.o. daily. 4. Calcitriol 0.5 mg 1 p.o. daily. 5. Clonidine 0.2 mg p.o. b.i.d. 6. Hydralazine 50 mg p.o. t.i.d. 7. Labetalol 400 mg p.o. b.i.d. 8. Multivitamins. 9. Oxycodone 5 mg p.o. q.4h.  p.r.n. 10.Zocor 80 mg p.o. daily. 11.Vitamin D 50,000 units 1 capsule per week.  REVIEW OF SYSTEMS:  Negative for seizures, tremors, polyuria, polydipsia, dizziness, orthopnea or PND.  He denies melena, hematemesis, or hematochezia.  PHYSICAL EXAMINATION:  VITAL SIGNS:  Temperature is 97.7, respiratory rate is 24, O2 sat is 98% on 2 liters, blood pressure 138/84. HEENT:  Eyes:  PERRLA.  Extraocular movements intact.  Sclerae clear. Conjunctiva pink to pale. NECK:  Shows no JVD, no carotid bruits, no thyromegaly, no thyroid bruits. LUNGS:  Show diminished breath sounds at the bases, prolonged expiratory phase, mild end-expiratory wheeze, scattered rhonchi are appreciable. HEART:  Regular rhythm.  No S3, S4.  No heaves, thrills, or rubs. ABDOMEN:  Soft, nontender.  Bowel sounds normoactive.  No guarding, rebound, mass, or megaly. EXTREMITIES:  No clubbing, cyanosis, or edema. NEUROLOGIC:  Cranial nerves II through XII grossly intact.  The patient moves all four extremities.  Plantars are downgoing.  IMPRESSION: 1. Left lower lobe infiltrate. 2. Hypertension. 3. Hypertensive cardiomyopathy. 4. Chronic renal failure, creatinine 3.4. 5. Anemia of chronic disease secondary to renal failure. 6. Hyperlipidemia. 7. Ejection fraction 35-40%. 8. Gout.  PLAN:  Right now is to admit for vancomycin, Zosyn, Solu-Medrol IV as well as DuoNeb nebulizers.  We will obtain D-dimer and make further recommendations as the database expands.  We  will repeat ABG in a.m.     Melvyn Novas, MD     RMD/MEDQ  D:  08/26/2012  T:  08/26/2012  Job:  [id number]

## 2012-08-26 NOTE — ED Provider Notes (Signed)
See prior note   Ward Givens, MD 08/26/12 [phone]

## 2012-08-26 NOTE — ED Notes (Signed)
Pt given sprite to drink, breathing easier and stated he was feeling much better. Awaiting dr. Janna Arch to admit.

## 2012-08-27 LAB — BASIC METABOLIC PANEL
Calcium: 8.7 mg/dL (ref 8.4–10.5)
GFR calc Af Amer: 15 mL/min — ABNORMAL LOW (ref >90)
GFR calc non Af Amer: 13 mL/min — ABNORMAL LOW (ref >90)
Potassium: 4.4 mEq/L (ref 3.5–5.1)
Sodium: 138 mEq/L (ref 135–145)

## 2012-08-27 LAB — BLOOD GAS, ARTERIAL
Drawn by: 22223
O2 Content: 2 L/min
O2 Saturation: 96.8 %
pCO2 arterial: 37.6 mmHg (ref 35.0–45.0)
pH, Arterial: 7.361 (ref 7.350–7.450)
pO2, Arterial: 88.3 mmHg (ref 80.0–100.0)

## 2012-08-27 LAB — HEPATIC FUNCTION PANEL
Albumin: 2.6 g/dL — ABNORMAL LOW (ref 3.5–5.2)
Total Bilirubin: 0.2 mg/dL — ABNORMAL LOW (ref 0.3–1.2)
Total Protein: 5.9 g/dL — ABNORMAL LOW (ref 6.0–8.3)

## 2012-08-27 LAB — IRON AND TIBC: Iron: 34 ug/dL — ABNORMAL LOW (ref 42–135)

## 2012-08-27 LAB — VITAMIN B12: Vitamin B-12: 438 pg/mL (ref 211–911)

## 2012-08-27 LAB — FOLATE: Folate: 8.4 ng/mL

## 2012-08-27 LAB — TSH: TSH: 0.614 u[IU]/mL (ref 0.350–4.500)

## 2012-08-27 MED ORDER — HYDRALAZINE HCL 25 MG PO TABS
50.0000 mg | ORAL_TABLET | Freq: Three times a day (TID) | ORAL | Status: DC
  Administered 2012-08-27 – 2012-08-29 (×6): 50 mg via ORAL
  Filled 2012-08-27: qty 2
  Filled 2012-08-27: qty 1
  Filled 2012-08-27 (×4): qty 2

## 2012-08-27 MED ORDER — ATORVASTATIN CALCIUM 40 MG PO TABS
40.0000 mg | ORAL_TABLET | Freq: Every day | ORAL | Status: DC
  Administered 2012-08-27 – 2012-08-28 (×2): 40 mg via ORAL
  Filled 2012-08-27 (×2): qty 1

## 2012-08-27 NOTE — Progress Notes (Signed)
[id number] 

## 2012-08-27 NOTE — Care Management Note (Signed)
    Page 1 of 2   08/28/2012     2:29:03 PM   CARE MANAGEMENT NOTE 08/28/2012  Patient:  Randy [name], Randy [name]   Account Number:  [id number]  Date Initiated:  08/27/2012  Documentation initiated by:  Sharrie Rothman  Subjective/Objective Assessment:   Pt admitted from home with pneumonia. Pt lives with his wife and will return home at discharge. Pt has new fistula for possible need for dialysis in the future. Pt is indpendent with ADL's.     Action/Plan:   Will continue to follow for possible need for O2 at discharge.   Anticipated DC Date:  08/31/2012   Anticipated DC Plan:  HOME/SELF CARE      DC Planning Services  CM consult      PAC Choice  DURABLE MEDICAL EQUIPMENT  HOME HEALTH   Choice offered to / List presented to:  C-1 Patient   DME arranged  NEBULIZER MACHINE      DME agency  [facility]     HH arranged  HH-2 PT  HH-7 RESPIRATORY THERAPY      HH agency  [facility]   Status of service:  Completed, signed off Medicare Important Message given?  YES (If response is "NO", the following Medicare IM given date fields will be blank) Date Medicare IM given:  08/28/2012 Date Additional Medicare IM given:    Discharge Disposition:  HOME W HOME HEALTH SERVICES  Per UR Regulation:    If discussed at Long Length of Stay Meetings, dates discussed:    Comments:  08/28/12 1414 Arlyss Queen, RN BSN CM Pt to be discharged over weekend. Pt will need HH and pt has chosen [facility] for RT and PT. Concha Pyo of [facility] is aware and will collect the pts information from the chart. Order for neb machine faxed to [place] for delivery prior to discharge. Pt and pts nurse aware of discharge arrangements. Weekend staff will call and fax HH orders once written.   08/27/12 1410 Arlyss Queen, RN BSN CM

## 2012-08-27 NOTE — Progress Notes (Signed)
UR chart review completed.  

## 2012-08-28 LAB — HEPATIC FUNCTION PANEL
Albumin: 2.9 g/dL — ABNORMAL LOW (ref 3.5–5.2)
Total Bilirubin: 0.2 mg/dL — ABNORMAL LOW (ref 0.3–1.2)
Total Protein: 6.3 g/dL (ref 6.0–8.3)

## 2012-08-28 LAB — BASIC METABOLIC PANEL
Calcium: 9.2 mg/dL (ref 8.4–10.5)
GFR calc Af Amer: 14 mL/min — ABNORMAL LOW (ref >90)
GFR calc non Af Amer: 12 mL/min — ABNORMAL LOW (ref >90)
Sodium: 135 mEq/L (ref 135–145)

## 2012-08-28 MED ORDER — METHYLPREDNISOLONE SODIUM SUCC 125 MG IJ SOLR
80.0000 mg | Freq: Three times a day (TID) | INTRAMUSCULAR | Status: DC
  Administered 2012-08-28 – 2012-08-29 (×2): 80 mg via INTRAVENOUS
  Filled 2012-08-28 (×2): qty 2

## 2012-08-28 NOTE — Progress Notes (Signed)
NAMECRUZ, Randy [name] NO.:  [id number]  MEDICAL RECORD NO.:  [id number]  LOCATION:  A328                          FACILITY:  [facility]  PHYSICIAN:  Melvyn Novas, MDDATE OF BIRTH:  06/09/1932  DATE OF PROCEDURE: DATE OF DISCHARGE:                                PROGRESS NOTE   The patient has chronic renal failure.  Creatinine is 3.9 range.  Recent AV fistula placed in anticipation of dialysis.  Has anemia of chronic disease secondary to renal failure, left lower lobe infiltrate, right lower lobe atelectasis, hypertension suboptimally controlled, hyperlipidemia, some bronchospasm currently placed on vanc and Zosyn per pharmacy protocol.  Lungs show mild end-expiratory wheeze, scattered rhonchi, diminished breath sounds, left base greater than right.  Heart regular rhythm.  No S3, S4.  No heaves, thrills, or rubs.  Echo reveals ejection fraction of 45%, global hypokinesis.  Abdomen is soft, nontender.  Bowel sounds normoactive.  Extremities showed trace to 1+ pedal edema.  Blood gases improved today; pH 7.36, pCO2 of 37, and pO2 is 88.  The patient responding to steroids, bronchodilators as well as dual antibiotics.  The patient appears hemodynamically stable.  We will check BMET in a.m.  Magnesium within normal limits.  Ferritin within normal limits.  Iron low, TIBC within normal parameters.  Continue current therapy.     Melvyn Novas, MD     RMD/MEDQ  D:  08/27/2012  T:  08/28/2012  Job:  [id number]

## 2012-08-29 LAB — CBC WITH DIFFERENTIAL/PLATELET
Eosinophils Absolute: 0 10*3/uL (ref 0.0–0.7)
Eosinophils Relative: 0 % (ref 0–5)
Hemoglobin: 8 g/dL — ABNORMAL LOW (ref 13.0–17.0)
Lymphs Abs: 0.4 10*3/uL — ABNORMAL LOW (ref 0.7–4.0)
MCH: 28.3 pg (ref 26.0–34.0)
MCV: 86.2 fL (ref 78.0–100.0)
Monocytes Relative: 5 % (ref 3–12)
Platelets: 265 10*3/uL (ref 150–400)
RBC: 2.83 MIL/uL — ABNORMAL LOW (ref 4.22–5.81)

## 2012-08-29 LAB — BASIC METABOLIC PANEL
CO2: 20 mEq/L (ref 19–32)
Chloride: 101 mEq/L (ref 96–112)
GFR calc non Af Amer: 12 mL/min — ABNORMAL LOW (ref >90)
Glucose, Bld: 181 mg/dL — ABNORMAL HIGH (ref 70–99)
Potassium: 4.7 mEq/L (ref 3.5–5.1)
Sodium: 133 mEq/L — ABNORMAL LOW (ref 135–145)

## 2012-08-29 LAB — HEPATIC FUNCTION PANEL
AST: 12 U/L (ref 0–37)
Albumin: 2.7 g/dL — ABNORMAL LOW (ref 3.5–5.2)
Total Bilirubin: 0.2 mg/dL — ABNORMAL LOW (ref 0.3–1.2)

## 2012-08-29 MED ORDER — LEVOFLOXACIN 500 MG PO TABS: 500.0000 mg | ORAL_TABLET | Freq: Every day | ORAL | Status: DC

## 2012-08-29 MED ORDER — PREDNISONE 20 MG PO TABS: 20.0000 mg | ORAL_TABLET | Freq: Two times a day (BID) | ORAL | Status: DC

## 2012-08-29 MED ORDER — CLONIDINE HCL 0.2 MG PO TABS: 0.2000 mg | ORAL_TABLET | Freq: Two times a day (BID) | ORAL | Status: DC

## 2012-08-29 MED ORDER — ALBUTEROL SULFATE (5 MG/ML) 0.5% IN NEBU: 2.5000 mg | INHALATION_SOLUTION | Freq: Four times a day (QID) | RESPIRATORY_TRACT | Status: DC

## 2012-08-29 NOTE — Discharge Summary (Signed)
NAME:  Randy [name], Randy [name] NO.:  [id number]  MEDICAL RECORD NO.:  [id number]  LOCATION:  A328                          FACILITY:  [facility]  PHYSICIAN:  Melvyn Novas, MDDATE OF BIRTH:  05/27/1932  DATE OF ADMISSION:  08/26/2012 DATE OF DISCHARGE:  LH                              DISCHARGE SUMMARY   The patient is a 78 year old black gentleman with history of hypertension, chronic renal failure, hyperlipidemia, COPD, and degenerative joint disease, who has a usual creatinine of 3.8 range who came in with COPD exacerbation and hypoxia.  He was given several nebulizer treatments in ER and subsequently admitted for control of his respiratory status.  He had no distinct infiltrate on chest x-ray.  It showed mild atelectasis, however, he was placed on vanc and Zosyn empirically for any infectious process.  Given aggressive nebulizer therapy and Solu-Medrol 125 IV q.6 h., which was tapered over several days.  The patient was hemodynamically stable in hospital.  He has chronic anemia due to renal insufficiency.  No iron deficiency noted by lab work during this admission.  Hemoglobin ranged in 8 g hemoglobin range, which is stable for him.  He is on Procrit as an outpatient.  On the day of discharge, his wheezing had subsided.  His lungs are showed diminished breath sounds at the bases.  No rales, no wheeze audible. Blood pressures in the 128 systolic range.  He was afebrile.  No respiratory distress.  Heart, regular rhythm.  No S3, S4.  No heaves, thrills, or rubs.  Abdomen is essentially benign.  Leg showed no evidence of DVT.  He was empirically on Lovenox during this admission. He was subsequently discharged on the following medicines, most of his home medicines which included: 1. Norvasc 10 p.o. daily. 2. Zocor 80 p.o. daily. 3. Allopurinol 300 p.o. daily. 4. Multivitamins. 5. Calcitriol 0.5 mcg daily. 6. Hydralazine 50 mg p.o. t.i.d. 7. Labetalol 400 mg p.o.  b.i.d. 8. Lasix 40 mg p.o. b.i.d. 9. Oxycodone 5 mg q.i.d. p.r.n. 10.In addition, he was subsequently given albuterol nebulizer     treatment with home health and face-to-face     followup 5 mg/mL, 0.5% nebulizer solution to take q.i.d. at home as     well as prednisone 20 mg p.o. b.i.d. for 5 days.  He will follow up in the office on Tuesday for tapering and successive doses of prednisone which are anticipated.     Melvyn Novas, MD     RMD/MEDQ  D:  08/29/2012  T:  08/29/2012  Job:  [id number]

## 2012-08-29 NOTE — Progress Notes (Signed)
Pt has had nebulizer delivered.  HH has been arranged, order faxed and called to confirm.  IV removed, site WNL.  Pt given d/c instructions and new prescriptions.  Discussed home care with patient and discussed home medications, patient verbalizes understanding, teachback completed. F/U appointments in place, pt states they will keep appointment. Pt is stable at this time. Pt taken to main entrance in wheelchair by staff member.

## 2012-08-29 NOTE — Discharge Summary (Signed)
[id number] 

## 2012-08-29 NOTE — Progress Notes (Signed)
NAMEQUINNLAN, ABRUZZO NO.:  [id number]  MEDICAL RECORD NO.:  [id number]  LOCATION:  A328                          FACILITY:  [facility]  PHYSICIAN:  Melvyn Novas, MDDATE OF BIRTH:  October 11, 1931  DATE OF PROCEDURE: DATE OF DISCHARGE:                                PROGRESS NOTE   The patient has pneumonia, COPD, chronic renal failure, respiratory insufficiency due to hypoxia, currently on vancomycin and Zosyn.  Doing well on day 3 or 4 in the hospital.  His creatinine has jumped up to 4.23.  He is on pharmacy protocol, we will increase his IV fluids.  LUNGS:  His lungs show no wheeze today, no rales, diminished breath sounds at the bases.  Prolonged expiratory phase. HEART:  Regular rhythm.  No murmurs, gallops, or rubs. ABDOMEN:  Soft, nontender.  Bowel sounds normoactive.  The plan right now is to decrease Solu-Medrol from 125 to 80 q.8 hours. Consider discharge in a day or so.  Check CBC and BMET in a.m., and continue vancomycin and Zosyn per pharmacy protocol, and DuoNeb nebulizers at home.     Melvyn Novas, MD     RMD/MEDQ  D:  08/28/2012  T:  08/29/2012  Job:  [id number]

## 2012-09-01 ENCOUNTER — Ambulatory Visit: Payer: Medicare Other | Admitting: Cardiology

## 2012-09-03 ENCOUNTER — Ambulatory Visit: Payer: Medicare Other | Admitting: Orthopedic Surgery

## 2012-09-21 ENCOUNTER — Encounter: Payer: Self-pay | Admitting: Vascular Surgery

## 2012-09-22 ENCOUNTER — Encounter: Payer: Self-pay | Admitting: Vascular Surgery

## 2012-09-22 ENCOUNTER — Ambulatory Visit (INDEPENDENT_AMBULATORY_CARE_PROVIDER_SITE_OTHER): Payer: Medicare Other | Admitting: Vascular Surgery

## 2012-09-22 VITALS — BP 145/77 | HR 77 | Resp 18 | Ht 69.0 in | Wt 176.9 lb

## 2012-09-22 DIAGNOSIS — N186 End stage renal disease: Secondary | ICD-10-CM

## 2012-09-22 NOTE — Progress Notes (Signed)
Patient presents today for followup of his left Cimino AV fistula creation by myself 1 08/24/2012. His incision is healing quite nicely he has no discomfort associated with this. He has excellent Ellajane Stong maturation of his forearm cephalic vein fistula. He has an excellent thrill and excellent Jahkai Yandell size development. I'm pleased with his initial results. He will continue his followup with the renal service and will see [place] on an as-needed basis. He currently is not on hemodialysis

## 2012-09-23 DIAGNOSIS — Z8701 Personal history of pneumonia (recurrent): Secondary | ICD-10-CM

## 2012-09-23 HISTORY — DX: Personal history of pneumonia (recurrent): Z87.01

## 2012-09-24 ENCOUNTER — Encounter (HOSPITAL_COMMUNITY): Payer: Self-pay | Admitting: *Deleted

## 2012-09-24 ENCOUNTER — Inpatient Hospital Stay (HOSPITAL_COMMUNITY): Payer: Medicare Other

## 2012-09-24 ENCOUNTER — Inpatient Hospital Stay (HOSPITAL_COMMUNITY)
Admission: EM | Admit: 2012-09-24 | Discharge: 2012-10-01 | DRG: 193 | Disposition: A | Discharge status: Home / Self Care | Payer: Medicare Other | Attending: Family Medicine | Admitting: Family Medicine

## 2012-09-24 ENCOUNTER — Emergency Department (HOSPITAL_COMMUNITY): Payer: Medicare Other

## 2012-09-24 DIAGNOSIS — R6511 Systemic inflammatory response syndrome (SIRS) of non-infectious origin with acute organ dysfunction: Secondary | ICD-10-CM | POA: Diagnosis present

## 2012-09-24 DIAGNOSIS — N186 End stage renal disease: Secondary | ICD-10-CM

## 2012-09-24 DIAGNOSIS — IMO0002 Reserved for concepts with insufficient information to code with codable children: Secondary | ICD-10-CM

## 2012-09-24 DIAGNOSIS — N170 Acute kidney failure with tubular necrosis: Secondary | ICD-10-CM | POA: Diagnosis present

## 2012-09-24 DIAGNOSIS — Z87891 Personal history of nicotine dependence: Secondary | ICD-10-CM

## 2012-09-24 DIAGNOSIS — J810 Acute pulmonary edema: Secondary | ICD-10-CM

## 2012-09-24 DIAGNOSIS — I429 Cardiomyopathy, unspecified: Secondary | ICD-10-CM | POA: Diagnosis present

## 2012-09-24 DIAGNOSIS — J441 Chronic obstructive pulmonary disease with (acute) exacerbation: Secondary | ICD-10-CM | POA: Diagnosis present

## 2012-09-24 DIAGNOSIS — E8809 Other disorders of plasma-protein metabolism, not elsewhere classified: Secondary | ICD-10-CM | POA: Diagnosis present

## 2012-09-24 DIAGNOSIS — M109 Gout, unspecified: Secondary | ICD-10-CM | POA: Diagnosis present

## 2012-09-24 DIAGNOSIS — J189 Pneumonia, unspecified organism: Principal | ICD-10-CM | POA: Diagnosis present

## 2012-09-24 DIAGNOSIS — E785 Hyperlipidemia, unspecified: Secondary | ICD-10-CM | POA: Diagnosis present

## 2012-09-24 DIAGNOSIS — I739 Peripheral vascular disease, unspecified: Secondary | ICD-10-CM | POA: Diagnosis present

## 2012-09-24 DIAGNOSIS — Y95 Nosocomial condition: Secondary | ICD-10-CM

## 2012-09-24 DIAGNOSIS — N184 Chronic kidney disease, stage 4 (severe): Secondary | ICD-10-CM

## 2012-09-24 DIAGNOSIS — N039 Chronic nephritic syndrome with unspecified morphologic changes: Secondary | ICD-10-CM | POA: Diagnosis present

## 2012-09-24 DIAGNOSIS — N2581 Secondary hyperparathyroidism of renal origin: Secondary | ICD-10-CM | POA: Diagnosis present

## 2012-09-24 DIAGNOSIS — M129 Arthropathy, unspecified: Secondary | ICD-10-CM | POA: Diagnosis present

## 2012-09-24 DIAGNOSIS — Z79899 Other long term (current) drug therapy: Secondary | ICD-10-CM

## 2012-09-24 DIAGNOSIS — I509 Heart failure, unspecified: Secondary | ICD-10-CM | POA: Diagnosis present

## 2012-09-24 DIAGNOSIS — E7800 Pure hypercholesterolemia, unspecified: Secondary | ICD-10-CM | POA: Diagnosis present

## 2012-09-24 DIAGNOSIS — D631 Anemia in chronic kidney disease: Secondary | ICD-10-CM | POA: Diagnosis present

## 2012-09-24 DIAGNOSIS — J90 Pleural effusion, not elsewhere classified: Secondary | ICD-10-CM | POA: Diagnosis present

## 2012-09-24 DIAGNOSIS — I130 Hypertensive heart and chronic kidney disease with heart failure and stage 1 through stage 4 chronic kidney disease, or unspecified chronic kidney disease: Secondary | ICD-10-CM | POA: Diagnosis present

## 2012-09-24 DIAGNOSIS — E871 Hypo-osmolality and hyponatremia: Secondary | ICD-10-CM | POA: Diagnosis present

## 2012-09-24 LAB — CBC
HCT: 24.4 % — ABNORMAL LOW (ref 39.0–52.0)
Hemoglobin: 7.6 g/dL — ABNORMAL LOW (ref 13.0–17.0)
MCH: 27.6 pg (ref 26.0–34.0)
MCHC: 31.1 g/dL (ref 30.0–36.0)
MCV: 88.7 fL (ref 78.0–100.0)
Platelets: 317 10*3/uL (ref 150–400)
RBC: 2.75 MIL/uL — ABNORMAL LOW (ref 4.22–5.81)
RDW: 16.7 % — ABNORMAL HIGH (ref 11.5–15.5)
WBC: 6.3 10*3/uL (ref 4.0–10.5)

## 2012-09-24 LAB — BASIC METABOLIC PANEL
BUN: 46 mg/dL — ABNORMAL HIGH (ref 6–23)
CO2: 25 mEq/L (ref 19–32)
Calcium: 9.1 mg/dL (ref 8.4–10.5)
Chloride: 104 mEq/L (ref 96–112)
Creatinine, Ser: 3.9 mg/dL — ABNORMAL HIGH (ref 0.50–1.35)
GFR calc Af Amer: 16 mL/min — ABNORMAL LOW (ref >90)
GFR calc non Af Amer: 13 mL/min — ABNORMAL LOW (ref >90)
Glucose, Bld: 153 mg/dL — ABNORMAL HIGH (ref 70–99)
Potassium: 4.2 mEq/L (ref 3.5–5.1)
Sodium: 139 mEq/L (ref 135–145)

## 2012-09-24 LAB — TROPONIN I: Troponin I: <0.3 ng/mL (ref <0.30)

## 2012-09-24 LAB — PRO B NATRIURETIC PEPTIDE: Pro B Natriuretic peptide (BNP): 14595 pg/mL — ABNORMAL HIGH (ref 0–450)

## 2012-09-24 MED ORDER — DEXTROSE 5 % IV SOLN
1.0000 g | INTRAVENOUS | Status: DC
  Administered 2012-09-25 – 2012-09-30 (×6): 1 g via INTRAVENOUS
  Filled 2012-09-24 (×7): qty 1

## 2012-09-24 MED ORDER — CALCITRIOL 0.25 MCG PO CAPS
0.5000 ug | ORAL_CAPSULE | Freq: Every day | ORAL | Status: DC
  Administered 2012-09-24 – 2012-10-01 (×8): 0.5 ug via ORAL
  Filled 2012-09-24 (×4): qty 2
  Filled 2012-09-24: qty 1
  Filled 2012-09-24: qty 2
  Filled 2012-09-24: qty 1
  Filled 2012-09-24: qty 2

## 2012-09-24 MED ORDER — METHYLPREDNISOLONE SODIUM SUCC 125 MG IJ SOLR
125.0000 mg | Freq: Four times a day (QID) | INTRAMUSCULAR | Status: DC
  Administered 2012-09-24 – 2012-09-29 (×18): 125 mg via INTRAVENOUS
  Filled 2012-09-24 (×18): qty 2

## 2012-09-24 MED ORDER — ATORVASTATIN CALCIUM 40 MG PO TABS
40.0000 mg | ORAL_TABLET | Freq: Every day | ORAL | Status: DC
  Administered 2012-09-24 – 2012-09-30 (×7): 40 mg via ORAL
  Filled 2012-09-24 (×7): qty 1

## 2012-09-24 MED ORDER — ALBUTEROL SULFATE (5 MG/ML) 0.5% IN NEBU
2.5000 mg | INHALATION_SOLUTION | RESPIRATORY_TRACT | Status: DC | PRN
  Administered 2012-09-24: 2.5 mg via RESPIRATORY_TRACT
  Filled 2012-09-24: qty 0.5

## 2012-09-24 MED ORDER — CLONIDINE HCL 0.2 MG PO TABS
0.2000 mg | ORAL_TABLET | Freq: Two times a day (BID) | ORAL | Status: DC
  Administered 2012-09-24 – 2012-10-01 (×14): 0.2 mg via ORAL
  Filled 2012-09-24 (×14): qty 1

## 2012-09-24 MED ORDER — ONDANSETRON HCL 4 MG/2ML IJ SOLN
4.0000 mg | Freq: Three times a day (TID) | INTRAMUSCULAR | Status: AC | PRN
  Filled 2012-09-24: qty 2

## 2012-09-24 MED ORDER — FUROSEMIDE 40 MG PO TABS
40.0000 mg | ORAL_TABLET | Freq: Two times a day (BID) | ORAL | Status: DC
  Administered 2012-09-24 – 2012-09-27 (×7): 40 mg via ORAL
  Filled 2012-09-24 (×7): qty 1

## 2012-09-24 MED ORDER — ACETAMINOPHEN 325 MG PO TABS: 650.0000 mg | ORAL_TABLET | Freq: Four times a day (QID) | ORAL | Status: DC | PRN

## 2012-09-24 MED ORDER — OXYCODONE HCL 5 MG PO TABS
5.0000 mg | ORAL_TABLET | ORAL | Status: DC | PRN
  Administered 2012-09-25 – 2012-09-30 (×2): 5 mg via ORAL
  Filled 2012-09-24 (×2): qty 1

## 2012-09-24 MED ORDER — BUDESONIDE-FORMOTEROL FUMARATE 160-4.5 MCG/ACT IN AERO
2.0000 | INHALATION_SPRAY | Freq: Two times a day (BID) | RESPIRATORY_TRACT | Status: DC
  Administered 2012-09-24 – 2012-10-01 (×14): 2 via RESPIRATORY_TRACT
  Filled 2012-09-24: qty 6

## 2012-09-24 MED ORDER — ALBUTEROL SULFATE (5 MG/ML) 0.5% IN NEBU
2.5000 mg | INHALATION_SOLUTION | RESPIRATORY_TRACT | Status: DC
  Administered 2012-09-24 – 2012-09-26 (×8): 2.5 mg via RESPIRATORY_TRACT
  Filled 2012-09-24 (×8): qty 0.5

## 2012-09-24 MED ORDER — ONDANSETRON HCL 4 MG/2ML IJ SOLN
4.0000 mg | Freq: Four times a day (QID) | INTRAMUSCULAR | Status: DC | PRN
  Administered 2012-09-24: 4 mg via INTRAVENOUS

## 2012-09-24 MED ORDER — FUROSEMIDE 10 MG/ML IJ SOLN
60.0000 mg | Freq: Once | INTRAMUSCULAR | Status: AC
  Administered 2012-09-24: 60 mg via INTRAVENOUS
  Filled 2012-09-24: qty 6

## 2012-09-24 MED ORDER — IPRATROPIUM BROMIDE 0.02 % IN SOLN
0.5000 mg | Freq: Once | RESPIRATORY_TRACT | Status: AC
  Administered 2012-09-24: 0.5 mg via RESPIRATORY_TRACT
  Filled 2012-09-24: qty 2.5

## 2012-09-24 MED ORDER — LEVOFLOXACIN IN D5W 750 MG/150ML IV SOLN
750.0000 mg | INTRAVENOUS | Status: DC
  Administered 2012-09-24: 750 mg via INTRAVENOUS
  Filled 2012-09-24 (×2): qty 150

## 2012-09-24 MED ORDER — TRAZODONE HCL 50 MG PO TABS
25.0000 mg | ORAL_TABLET | Freq: Every evening | ORAL | Status: DC | PRN
  Administered 2012-09-27: 25 mg via ORAL
  Filled 2012-09-24 (×2): qty 1

## 2012-09-24 MED ORDER — ACETAMINOPHEN 650 MG RE SUPP: 650.0000 mg | Freq: Four times a day (QID) | RECTAL | Status: DC | PRN

## 2012-09-24 MED ORDER — ENOXAPARIN SODIUM 30 MG/0.3ML ~~LOC~~ SOLN
30.0000 mg | SUBCUTANEOUS | Status: DC
  Administered 2012-09-24 – 2012-09-30 (×7): 30 mg via SUBCUTANEOUS
  Filled 2012-09-24 (×7): qty 0.3

## 2012-09-24 MED ORDER — SODIUM CHLORIDE 0.9 % IJ SOLN
3.0000 mL | Freq: Two times a day (BID) | INTRAMUSCULAR | Status: DC
  Administered 2012-09-24 – 2012-09-28 (×8): 3 mL via INTRAVENOUS

## 2012-09-24 MED ORDER — ALBUTEROL SULFATE (5 MG/ML) 0.5% IN NEBU
5.0000 mg | INHALATION_SOLUTION | Freq: Once | RESPIRATORY_TRACT | Status: AC
  Administered 2012-09-24: 5 mg via RESPIRATORY_TRACT
  Filled 2012-09-24: qty 1

## 2012-09-24 MED ORDER — ALBUTEROL SULFATE (5 MG/ML) 0.5% IN NEBU
2.5000 mg | INHALATION_SOLUTION | Freq: Once | RESPIRATORY_TRACT | Status: AC
  Administered 2012-09-24: 2.5 mg via RESPIRATORY_TRACT
  Filled 2012-09-24: qty 0.5

## 2012-09-24 MED ORDER — DEXTROSE 5 % IV SOLN: 1.0000 g | INTRAVENOUS | Status: DC

## 2012-09-24 MED ORDER — ADULT MULTIVITAMIN W/MINERALS CH
1.0000 | ORAL_TABLET | Freq: Every day | ORAL | Status: DC
  Administered 2012-09-25 – 2012-10-01 (×7): 1 via ORAL
  Filled 2012-09-24 (×7): qty 1

## 2012-09-24 MED ORDER — ONDANSETRON HCL 4 MG PO TABS: 4.0000 mg | ORAL_TABLET | Freq: Four times a day (QID) | ORAL | Status: DC | PRN

## 2012-09-24 MED ORDER — DEXTROSE 5 % IV SOLN
INTRAVENOUS | Status: AC
  Filled 2012-09-24: qty 1

## 2012-09-24 MED ORDER — LABETALOL HCL 200 MG PO TABS
400.0000 mg | ORAL_TABLET | Freq: Two times a day (BID) | ORAL | Status: DC
  Administered 2012-09-24 – 2012-10-01 (×14): 400 mg via ORAL
  Filled 2012-09-24 (×14): qty 2

## 2012-09-24 MED ORDER — DEXTROSE 5 % IV SOLN
2.0000 g | INTRAVENOUS | Status: AC
  Administered 2012-09-24: 2 g via INTRAVENOUS
  Filled 2012-09-24: qty 2

## 2012-09-24 MED ORDER — DEXTROSE 5 % IV SOLN
1.0000 g | Freq: Two times a day (BID) | INTRAVENOUS | Status: DC
  Filled 2012-09-24 (×2): qty 1

## 2012-09-24 MED ORDER — VANCOMYCIN HCL 10 G IV SOLR
1500.0000 mg | INTRAVENOUS | Status: AC
  Administered 2012-09-24: 1500 mg via INTRAVENOUS
  Filled 2012-09-24: qty 1500

## 2012-09-24 MED ORDER — AMLODIPINE BESYLATE 5 MG PO TABS
10.0000 mg | ORAL_TABLET | Freq: Every morning | ORAL | Status: DC
  Administered 2012-09-25 – 2012-10-01 (×7): 10 mg via ORAL
  Filled 2012-09-24 (×7): qty 2

## 2012-09-24 MED ORDER — BUDESONIDE-FORMOTEROL FUMARATE 160-4.5 MCG/ACT IN AERO
INHALATION_SPRAY | RESPIRATORY_TRACT | Status: AC
  Filled 2012-09-24: qty 6

## 2012-09-24 MED ORDER — SODIUM CHLORIDE 0.9 % IJ SOLN: 3.0000 mL | INTRAMUSCULAR | Status: DC | PRN

## 2012-09-24 MED ORDER — IPRATROPIUM BROMIDE 0.02 % IN SOLN
0.5000 mg | RESPIRATORY_TRACT | Status: DC
  Administered 2012-09-24 – 2012-09-26 (×8): 0.5 mg via RESPIRATORY_TRACT
  Filled 2012-09-24 (×8): qty 2.5

## 2012-09-24 MED ORDER — DEXTROSE 5 % IV SOLN: 1.0000 g | Freq: Three times a day (TID) | INTRAVENOUS | Status: DC

## 2012-09-24 MED ORDER — LEVOFLOXACIN IN D5W 500 MG/100ML IV SOLN
500.0000 mg | INTRAVENOUS | Status: DC
  Administered 2012-09-26 – 2012-09-30 (×3): 500 mg via INTRAVENOUS
  Filled 2012-09-24 (×3): qty 100

## 2012-09-24 MED ORDER — SODIUM CHLORIDE 0.9 % IV SOLN: 250.0000 mL | INTRAVENOUS | Status: DC | PRN

## 2012-09-24 MED ORDER — VANCOMYCIN HCL IN DEXTROSE 1-5 GM/200ML-% IV SOLN
1000.0000 mg | INTRAVENOUS | Status: DC
  Administered 2012-09-26 – 2012-09-30 (×3): 1000 mg via INTRAVENOUS
  Filled 2012-09-24 (×4): qty 200

## 2012-09-24 MED ORDER — DEXTROSE 5 % IV SOLN
1.0000 g | Freq: Two times a day (BID) | INTRAVENOUS | Status: DC
  Filled 2012-09-24: qty 1

## 2012-09-24 MED ORDER — HYDRALAZINE HCL 25 MG PO TABS
50.0000 mg | ORAL_TABLET | Freq: Three times a day (TID) | ORAL | Status: DC
  Administered 2012-09-24 – 2012-10-01 (×20): 50 mg via ORAL
  Filled 2012-09-24 (×20): qty 2

## 2012-09-24 MED ORDER — ASPIRIN 300 MG RE SUPP
300.0000 mg | Freq: Every day | RECTAL | Status: DC
  Filled 2012-09-24 (×2): qty 1

## 2012-09-24 MED ORDER — HYDROCODONE-HOMATROPINE 5-1.5 MG/5ML PO SYRP
5.0000 mL | ORAL_SOLUTION | ORAL | Status: DC | PRN
  Administered 2012-09-24 – 2012-09-25 (×2): 5 mL via ORAL
  Filled 2012-09-24 (×2): qty 5

## 2012-09-24 NOTE — ED Notes (Signed)
Attempted to call report. Lisa, RN to call back. 

## 2012-09-24 NOTE — ED Notes (Signed)
Sob, recent adm with pneumonia

## 2012-09-24 NOTE — ED Notes (Signed)
Patient provided urinal

## 2012-09-24 NOTE — ED Notes (Addendum)
Patient reports shortness of breath x 2 months. Vascular access placed 08/24/12, with re admit for HAP on 08/26/12. Increased work of breathing noted.

## 2012-09-24 NOTE — Progress Notes (Signed)
ANTIBIOTIC CONSULT NOTE - INITIAL  Pharmacy Consult for Vancomycin, Cefepime, Levaquin Indication: pneumonia  No Known Allergies  Patient Measurements: Height: 5\' 9"  (175.3 cm) Weight: 180 lb (81.647 kg) IBW/kg (Calculated) : 70.7   Vital Signs: Temp: 97.6 F (36.4 C) (01/02 1438) Temp src: Oral (01/02 1438) BP: 158/97 mmHg (01/02 1438) Pulse Rate: 85  (01/02 1438) Intake/Output from previous day:   Intake/Output from this shift: Total I/O In: -  Out: 200 [Urine:200]  Labs:  [facility] 09/24/12 1119  WBC 6.3  HGB 7.6*  PLT 317  LABCREA --  CREATININE 3.90*   Estimated Creatinine Clearance: 15.6 ml/min (by C-G formula based on Cr of 3.9). No results found for this basename: VANCOTROUGH:2,VANCOPEAK:2,VANCORANDOM:2,GENTTROUGH:2,GENTPEAK:2,GENTRANDOM:2,TOBRATROUGH:2,TOBRAPEAK:2,TOBRARND:2,AMIKACINPEAK:2,AMIKACINTROU:2,AMIKACIN:2, in the last 72 hours   Microbiology: No results found for this or any previous visit (from the past 720 hour(s)).  Medical History: Past Medical History  Diagnosis Date  . Hypertensive heart disease   . Hypercholesterolemia   . Gout   . Secondary cardiomyopathy     LVEF 40-45%  . Anemia of chronic disease   . Hypertension   . CHF (congestive heart failure)   . Shortness of breath   . Arthritis   . CKD (chronic kidney disease) stage 4, GFR 15-29 ml/min     Medications:  Scheduled:    . [COMPLETED] albuterol  5 mg Nebulization Once  . [COMPLETED] ceFEPime (MAXIPIME) IV  2 g Intravenous To ER  . [COMPLETED] furosemide  60 mg Intravenous Once  . [COMPLETED] ipratropium  0.5 mg Nebulization Once  . levofloxacin (LEVAQUIN) IV  750 mg Intravenous Q24H  . vancomycin  1,500 mg Intravenous To ER  . [DISCONTINUED] ceFEPime (MAXIPIME) IV  1 g Intravenous Q8H   Assessment: 79 yo M admit with possible PNA.  He was admitted & treated for the same ~1 month ago.  Starting broad-spectrum antibiotics to coverage health-care acquired pathogens.     He has Stage 4 CKD, Scr is at patient's baseline today.  Antibiotic doses will be adjusted accordingly.   Goal of Therapy:  Vancomycin trough level 15-20 mcg/ml  Plan:  1) Cefepime 2gm IV x1 then 1gm IV q24h 2) Levaquin 750mg  IV x1 dose then 500mg  IV Q48h 3) Vancomycin 1500mg  IV x1 now then 1gm IV q48h 4) Check Vancomycin trough at steady state 5) Monitor renal function and cx data   Elson Clan 09/24/2012,3:07 PM

## 2012-09-24 NOTE — ED Notes (Signed)
Report given to Misty Stanley, RN [address]

## 2012-09-24 NOTE — Progress Notes (Signed)
Randy [name] stated he was feeling very nauseous. I gave him IV zofran and took his vital signs. They were stable with a temperature of 97.4. Will continue to monitor him for vomiting.

## 2012-09-24 NOTE — ED Provider Notes (Signed)
History    This chart was scribed for Raeford Razor, MD, MD by Smitty Pluck, ED Scribe. The patient was seen in room APA14 and the patient's care was started at 11:12 AM.   CSN: [id number]  Arrival date & time 09/24/12  1043      Chief Complaint  Patient presents with  . Shortness of Breath    (Consider location/radiation/quality/duration/timing/severity/associated sxs/prior treatment) Patient is a 79 y.o. male presenting with shortness of breath. The history is provided by the patient. No language interpreter was used.  Shortness of Breath  The current episode started 3 to 5 days ago. The onset was gradual. The problem occurs continuously. The problem has been unchanged. The problem is moderate. Nothing aggravates the symptoms. Associated symptoms include shortness of breath.   Randy [name] is a 79 y.o. male with hx of CHF, CKD and hypertensive heart disease who presents to the Emergency Department complaining of constant, moderate SOB onset 3 days ago. He states that he used breathing treatment at home with minor relief. He states that the swelling of bilateral lower extremities has worsened recently. Pt denies fever, chills, nausea, vomiting, diarrhea, chest pain and any other pain.   Past Medical History  Diagnosis Date  . Hypertensive heart disease   . Hypercholesterolemia   . Gout   . Secondary cardiomyopathy     LVEF 40-45%  . Anemia of chronic disease   . Hypertension   . CHF (congestive heart failure)   . Shortness of breath   . Arthritis   . CKD (chronic kidney disease) stage 4, GFR 15-29 ml/min     Past Surgical History  Procedure Date  . Knee surgery 2007    Right knee, arthroscopy  . Back surgery 2004    lumbar  . Hemorrhoid surgery 40 years ago  . Ganglion cyst excision 01/03/2012    Procedure: REMOVAL GANGLION OF WRIST;  Surgeon: Marlane Hatcher, MD;  Location: [facility];  Service: General;  Laterality: Right;  . Colonscopy   . Av fistula placement  08/24/2012    Procedure: ARTERIOVENOUS (AV) FISTULA CREATION;  Surgeon: Larina Earthly, MD;  Location: [facility] OR;  Service: Vascular;  Laterality: Left;    Family History  Problem Relation Age of Onset  . Arthritis    . Cancer    . Kidney disease    . Anesthesia problems Neg Hx   . Hypotension Neg Hx   . Malignant hyperthermia Neg Hx   . Pseudochol deficiency Neg Hx   . Cancer Sister   . Cancer Brother     colon  . Cancer Brother     colon    History  Substance Use Topics  . Smoking status: Former Smoker -- 1.0 packs/day for 30 years    Types: Cigarettes    Quit date: 08/19/1998  . Smokeless tobacco: Former Neurosurgeon    Quit date: 12/31/1993     Comment: quit cigs in 1999  . Alcohol Use: No      Review of Systems  Respiratory: Positive for shortness of breath.   All other systems reviewed and are negative.    Allergies  Review of patient's allergies indicates no known allergies.  Home Medications   Current Outpatient Rx  Name  Route  Sig  Dispense  Refill  . ALBUTEROL SULFATE (5 MG/ML) 0.5% IN NEBU   Nebulization   Take 0.5 mLs (2.5 mg total) by nebulization 4 (four) times daily.   20 mL   2   .  ALLOPURINOL 300 MG PO TABS   Oral   Take 300 mg by mouth every morning.          Marland Kitchen AMLODIPINE BESYLATE 10 MG PO TABS   Oral   Take 10 mg by mouth every morning.          Marland Kitchen CALCITRIOL 0.5 MCG PO CAPS   Oral   Take 1 capsule (0.5 mcg total) by mouth daily.   30 capsule   3   . CLONIDINE HCL 0.2 MG PO TABS   Oral   Take 1 tablet (0.2 mg total) by mouth 2 (two) times daily.   60 tablet   3   . FUROSEMIDE 40 MG PO TABS   Oral   Take 40 mg by mouth 2 (two) times daily.         Marland Kitchen HYDRALAZINE HCL 50 MG PO TABS   Oral   Take 1 tablet (50 mg total) by mouth 3 (three) times daily.   90 tablet   3   . LABETALOL HCL 200 MG PO TABS   Oral   Take 2 tablets (400 mg total) by mouth 2 (two) times daily.   120 tablet   6   . LEVOFLOXACIN 500 MG PO TABS   Oral    Take 1 tablet (500 mg total) by mouth daily.   5 tablet   0   . ADULT MULTIVITAMIN W/MINERALS CH   Oral   Take 1 tablet by mouth daily.         . OXYCODONE HCL 5 MG PO TABS   Oral   Take 1 tablet (5 mg total) by mouth every 4 (four) hours as needed for pain.   30 tablet   0   . PREDNISONE 20 MG PO TABS   Oral   Take 1 tablet (20 mg total) by mouth 2 (two) times daily with a meal.         . SIMVASTATIN 80 MG PO TABS   Oral   Take 80 mg by mouth at bedtime.             BP 156/81  Pulse 79  Temp 97.5 F (36.4 C) (Oral)  Resp 28  Ht 5\' 9"  (1.753 m)  Wt 180 lb (81.647 kg)  BMI 26.58 kg/m2  SpO2 96%  Physical Exam  Nursing note and vitals reviewed. Constitutional: He appears well-developed and well-nourished. No distress.  HENT:  Head: Normocephalic and atraumatic.  Eyes: Conjunctivae normal are normal. Right eye exhibits no discharge. Left eye exhibits no discharge.  Neck: Neck supple.  Cardiovascular: Normal rate, regular rhythm and normal heart sounds.  Exam reveals no gallop and no friction rub.   No murmur heard. Pulmonary/Chest: Breath sounds normal. Tachypnea noted. No respiratory distress.       Crackles at both lung bases   Abdominal: Soft. He exhibits no distension. There is no tenderness.  Musculoskeletal: He exhibits edema (symmetric pitting lower extremity ). He exhibits no tenderness.  Neurological: He is alert.  Skin: Skin is warm and dry.  Psychiatric: He has a normal mood and affect. His behavior is normal. Thought content normal.    ED Course  Procedures (including critical care time) DIAGNOSTIC STUDIES: Oxygen Saturation is 96% on [place], adequate by my interpretation.    COORDINATION OF CARE: 11:15 AM Discussed ED treatment with pt     Labs Reviewed - No data to display Dg Chest 2 View  09/24/2012  *RADIOLOGY REPORT*  Clinical Data:  Dyspnea, history of COPD  CHEST - 2 VIEW  Comparison: Most recent prior chest x-ray 08/26/2012  Findings:  Persistent dense left basilar opacity with new left larger than right pleural effusions.  Right retrocardiac and basilar opacities favored to reflect atelectasis.  Unchanged cardiomegaly.  Aortic atherosclerosis.  The upper lungs are well- aerated.  No acute osseous abnormality.  IMPRESSION:  1. Progression of dense left lower lobe opacity now with likely small associated pleural effusion.  Findings remain concerning for persistent versus recurrent pneumonia. Recommend imaging followup to resolution. 2.  New small right pleural effusion and right basilar retrocardiac atelectasis 3.  Stable cardiomegaly   Original Report Authenticated By: Malachy Moan, M.D.      1. HAP (hospital-acquired pneumonia)       MDM  79 year old male with worsening shortness of breath. Admission approximately one month ago for COPD exacerbation. Chest x-ray today shows progression have a left lower lobe opacity. Abx ordered for health care associated pneumonia. Discussed with hospitalist for admission.      I personally preformed the services scribed in my presence. The recorded information has been reviewed is accurate. Raeford Razor, MD.    Raeford Razor, MD 09/24/12 1524

## 2012-09-25 ENCOUNTER — Inpatient Hospital Stay (HOSPITAL_COMMUNITY): Payer: Medicare Other

## 2012-09-25 DIAGNOSIS — I369 Nonrheumatic tricuspid valve disorder, unspecified: Secondary | ICD-10-CM

## 2012-09-25 LAB — CBC
MCH: 28.3 pg (ref 26.0–34.0)
MCHC: 31.5 g/dL (ref 30.0–36.0)
MCV: 89.9 fL (ref 78.0–100.0)
Platelets: 317 10*3/uL (ref 150–400)
RBC: 2.58 MIL/uL — ABNORMAL LOW (ref 4.22–5.81)
RDW: 16.9 % — ABNORMAL HIGH (ref 11.5–15.5)

## 2012-09-25 LAB — BODY FLUID CELL COUNT WITH DIFFERENTIAL: Lymphs, Fluid: 27 %

## 2012-09-25 LAB — PROTEIN, BODY FLUID: Total protein, fluid: 1.3 g/dL

## 2012-09-25 LAB — GLUCOSE, SEROUS FLUID: Glucose, Fluid: 259 mg/dL

## 2012-09-25 LAB — LACTATE DEHYDROGENASE, PLEURAL OR PERITONEAL FLUID

## 2012-09-25 MED ORDER — ASPIRIN EC 325 MG PO TBEC
325.0000 mg | DELAYED_RELEASE_TABLET | Freq: Every day | ORAL | Status: DC
  Administered 2012-09-25: 325 mg via ORAL
  Filled 2012-09-25 (×2): qty 1

## 2012-09-25 MED ORDER — DARBEPOETIN ALFA-POLYSORBATE 60 MCG/0.3ML IJ SOLN
60.0000 ug | INTRAMUSCULAR | Status: DC
  Administered 2012-09-25: 60 ug via SUBCUTANEOUS
  Filled 2012-09-25: qty 0.3

## 2012-09-25 NOTE — Care Management Note (Signed)
    Page 1 of 1   10/01/2012     1:36:28 PM   CARE MANAGEMENT NOTE 10/01/2012  Patient:  Randy [name], Randy [name]   Account Number:  [id number]  Date Initiated:  09/25/2012  Documentation initiated by:  Sharrie Rothman  Subjective/Objective Assessment:   Pt admitted from home with pneumonia and pleural effusions. Pt lives with his wife and will return home at discharge. Pt has a neb machine for home use and is currently active with [facility].     Action/Plan:   Pt may need evaluation for home O2 prior to discharge. CM will arrange resumption of HH at discharge. Will continue to follow.   Anticipated DC Date:  09/28/2012   Anticipated DC Plan:  HOME W HOME HEALTH SERVICES      DC Planning Services  CM consult      Choice offered to / List presented to:             Status of service:  Completed, signed off Medicare Important Message given?  YES (If response is "NO", the following Medicare IM given date fields will be blank) Date Medicare IM given:  10/01/2012 Date Additional Medicare IM given:    Discharge Disposition:  HOME/SELF CARE  Per UR Regulation:    If discussed at Long Length of Stay Meetings, dates discussed:   09/29/2012  10/01/2012    Comments:  10/01/12 1335 Arlyss Queen, RN BSN CM pt discharged home today. Pt had been discharged from [facility] 09/22/12. No other HH or CM needs noted.  09/25/12 1517 Arlyss Queen, RN BSN CM

## 2012-09-25 NOTE — Consult Note (Signed)
Reason for Consult: Chronic renal failure Referring Physician: Dr. Bonita Quin is an 79 y.o. male.  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 Patient states that he was having difficulty breathing when he is lying down and also when he walks. He has some cough but no sputum production. At this moment he does not have any fever or chills or sweating. His appetite is good. According to the patient after thoracentesis he states he is feeling much better.  Past Medical History  Diagnosis Date  . Hypertensive heart disease   . Hypercholesterolemia   . Gout   . Secondary cardiomyopathy     LVEF 40-45%  . Anemia of chronic disease   . Hypertension   . CHF (congestive heart failure)   . Shortness of breath   . Arthritis   . CKD (chronic kidney disease) stage 4, GFR 15-29 ml/min     Past Surgical History  Procedure Date  . Knee surgery 2007    Right knee, arthroscopy  . Back surgery 2004    lumbar  . Hemorrhoid surgery 40 years ago  . Ganglion cyst excision 01/03/2012    Procedure: REMOVAL GANGLION OF WRIST;  Surgeon: Marlane Hatcher, MD;  Location: [facility];  Service: General;  Laterality: Right;  . Colonscopy   . Av fistula placement 08/24/2012    Procedure: ARTERIOVENOUS (AV) FISTULA CREATION;  Surgeon: Larina Earthly, MD;  Location: [facility] OR;  Service: Vascular;  Laterality: Left;    Family History  Problem Relation Age of Onset  . Arthritis    . Cancer    . Kidney disease    . Anesthesia problems Neg Hx   . Hypotension Neg Hx   . Malignant hyperthermia Neg Hx   . Pseudochol deficiency Neg Hx   . Cancer Sister   . Cancer Brother     colon  . Cancer Brother     colon    Social History:  reports that he quit smoking about 14 years ago. His smoking use included Cigarettes. He has a 30 pack-year smoking history. He quit smokeless  tobacco use about 18 years ago. He reports that he does not drink alcohol or use illicit drugs.  Allergies: No Known Allergies  Medications: I have reviewed the patient's current medications.  Results for orders placed during the hospital encounter of 09/24/12 (from the past 48 hour(s))  PRO B NATRIURETIC PEPTIDE     Status: Abnormal   Collection Time   09/24/12 11:19 AM      Component Value Range Comment   Pro B Natriuretic peptide (BNP) 14595.0 (*) 0 - 450 pg/mL   BASIC METABOLIC PANEL     Status: Abnormal   Collection Time   09/24/12 11:19 AM      Component Value Range Comment   Sodium 139  135 - 145 mEq/L    Potassium 4.2  3.5 - 5.1 mEq/L    Chloride 104  96 - 112 mEq/L    CO2 25  19 - 32 mEq/L    Glucose, Bld 153 (*) 70 - 99 mg/dL    BUN 46 (*) 6 - 23 mg/dL    Creatinine, Ser 0.86 (*) 0.50 - 1.35 mg/dL    Calcium 9.1  8.4 - 57.8 mg/dL    GFR calc non Af Amer 13 (*) >90 mL/min    GFR  calc Af Amer 16 (*) >90 mL/min   CBC     Status: Abnormal   Collection Time   09/24/12 11:19 AM      Component Value Range Comment   WBC 6.3  4.0 - 10.5 K/uL    RBC 2.75 (*) 4.22 - 5.81 MIL/uL    Hemoglobin 7.6 (*) 13.0 - 17.0 g/dL    HCT 65.7 (*) 84.6 - 52.0 %    MCV 88.7  78.0 - 100.0 fL    MCH 27.6  26.0 - 34.0 pg    MCHC 31.1  30.0 - 36.0 g/dL    RDW 96.2 (*) 95.2 - 15.5 %    Platelets 317  150 - 400 K/uL   TROPONIN I     Status: Normal   Collection Time   09/24/12 11:19 AM      Component Value Range Comment   Troponin I <0.30  <0.30 ng/mL   CBC     Status: Abnormal   Collection Time   09/25/12  5:23 AM      Component Value Range Comment   WBC 4.7  4.0 - 10.5 K/uL    RBC 2.58 (*) 4.22 - 5.81 MIL/uL    Hemoglobin 7.3 (*) 13.0 - 17.0 g/dL    HCT 84.1 (*) 32.4 - 52.0 %    MCV 89.9  78.0 - 100.0 fL    MCH 28.3  26.0 - 34.0 pg    MCHC 31.5  30.0 - 36.0 g/dL    RDW 40.1 (*) 02.7 - 15.5 %    Platelets 317  150 - 400 K/uL   PROTEIN, BODY FLUID     Status: Normal   Collection Time   09/25/12  12:00 PM      Component Value Range Comment   Total protein, fluid 1.3   NO NORMAL RANGE ESTABLISHED FOR THIS TEST   Fluid Type-FTP PLEURAL   CORRECTED ON 01/03 AT 1226: PREVIOUSLY REPORTED AS Body Fluid  BODY FLUID CELL COUNT WITH DIFFERENTIAL     Status: Normal   Collection Time   09/25/12 12:00 PM      Component Value Range Comment   Fluid Type-FCT PLEURAL   CORRECTED ON 01/03 AT 1225: PREVIOUSLY REPORTED AS Body Fluid   Color, Fluid YELLOW  YELLOW    Appearance, Fluid HAZY  CLEAR    WBC, Fluid 244  0 - 1000 cu mm    Neutrophil Count, Fluid 2  0 - 25 %    Lymphs, Fluid 27      Monocyte-Macrophage-Serous Fluid 71  50 - 90 %    Eos, Fluid 0      Other Cells, Fluid FEW MESOTHELIAL CELLS      GLUCOSE, SEROUS FLUID     Status: Normal   Collection Time   09/25/12 12:00 PM      Component Value Range Comment   Glucose, Fluid 259      Fluid Type-FGLU PLEURAL   CORRECTED ON 01/03 AT 1225: PREVIOUSLY REPORTED AS Body Fluid    Dg Chest 1 View  09/25/2012  *RADIOLOGY REPORT*  Clinical Data: Post right thoracentesis.  CHEST - 1 VIEW  Comparison: 09/25/1945  Findings: Improving right pleural effusion following thoracentesis. No pneumothorax.  Continued moderate left pleural effusion and left lower lobe atelectasis.  Heart is mildly enlarged, stable.  IMPRESSION: Decreasing right effusion following thoracentesis.  No pneumothorax.  Otherwise no change.   Original Report Authenticated By: Charlett Nose, M.D.    Dg Chest 2  View  09/24/2012  *RADIOLOGY REPORT*  Clinical Data: Dyspnea, history of COPD  CHEST - 2 VIEW  Comparison: Most recent prior chest x-ray 08/26/2012  Findings: Persistent dense left basilar opacity with new left larger than right pleural effusions.  Right retrocardiac and basilar opacities favored to reflect atelectasis.  Unchanged cardiomegaly.  Aortic atherosclerosis.  The upper lungs are well- aerated.  No acute osseous abnormality.  IMPRESSION:  1. Progression of dense left lower lobe  opacity now with likely small associated pleural effusion.  Findings remain concerning for persistent versus recurrent pneumonia. Recommend imaging followup to resolution. 2.  New small right pleural effusion and right basilar retrocardiac atelectasis 3.  Stable cardiomegaly   Original Report Authenticated By: Malachy Moan, M.D.    Ct Chest Wo Contrast  09/24/2012  *RADIOLOGY REPORT*  Clinical Data: Left lower lobe infiltrate.  CT CHEST WITHOUT CONTRAST  Technique:  Multidetector CT imaging of the chest was performed following the standard protocol without IV contrast.  Comparison: Chest x-ray dated 09/24/2012  Findings: There are large bilateral pleural effusions with compressive atelectasis of most of the left lower lobe and upper portion of the right lower lobe.  There is slight interstitial edema. Borderline cardiomegaly.  Extensive coronary artery calcification.  No mass lesions or appreciable adenopathy.  There is a partially cystic nodule in the posterior aspect of the left lobe of the thyroid gland measuring 2 cm in diameter. Remainder of the gland is atrophic.  The visualized portion of the upper abdomen demonstrates a cyst on the upper pole right kidney.  No visible ascites.  No acute osseous abnormality.  IMPRESSION:  1.  Large bilateral pleural effusions with secondary compressive atelectasis particularly of the left lower lobe. 2.  Borderline cardiomegaly with mild interstitial edema.   Original Report Authenticated By: Francene Boyers, M.D.     Review of Systems  Constitutional: Negative for fever and chills.  HENT: Positive for congestion.   Respiratory: Positive for cough and shortness of breath. Negative for sputum production.   Cardiovascular: Positive for orthopnea and PND. Negative for palpitations.  Gastrointestinal: Positive for nausea. Negative for vomiting.   Blood pressure 133/78, pulse 71, temperature 97.4 F (36.3 C), temperature source Oral, resp. rate 16, height 5\' 9"   (1.753 m), weight 79.5 kg (175 lb 4.3 oz), SpO2 96.00%. Physical Exam  Constitutional: No distress.  Eyes: No scleral icterus.  Neck: Neck supple. JVD present.  Cardiovascular: Normal rate, regular rhythm and normal heart sounds.   No murmur heard. Respiratory: He has no wheezes. He has no rales.       Decreased breath sound bilaterally. He has some  inspiratory rhonchi.  GI: He exhibits no distension. There is no tenderness. There is no rebound.  Musculoskeletal: He exhibits no edema.    Assessment/Plan: Problem #1 chronic kidney disease stage IV to 5. Presently his BUN and creatinine is stable. Patient doesn't have any nausea vomiting and his potassium is good. Problem #2 difficulty breathing most likely secondary to bilateral pleural effusion. Presently he is status post thoracentesis and feeling much better. Problem #3 anemia this is secondary to chronic renal failure H&H seems to be low Problem #4 metabolic bone disease patient with secondary hyperparathyroidism and he is on Rocaltrol. His last PTH was 559 which was improving. His phosphorus is was in acceptable range and normal calcium mainly diet controlled. Problem #5 history of hypertension his blood pressure seems to be reasonably controlled Problem #6 history of cardiomyopathy Problem #7 history of gout  Problem #8 history of hyperlipidemia. Problem #9 bilateral pleural effusion etiology as this moment is not clear. Plan: We'll start patient on Arnicep 60 units subcutaneous every other week. We'll continue with Rocaltrol and the we'll check his phosphorus in the morning. Patient doesn't require any dialysis as this moment he is already fistula which is about a month's duration.  Kynan Peasley S 09/25/2012, 1:30 PM

## 2012-09-25 NOTE — Progress Notes (Signed)
UR Chart Review Completed  

## 2012-09-25 NOTE — Progress Notes (Signed)
Right sided thoracentesis performed by Dr. Kearney Hard.  Procedure start time 1145. Right side thoracic area  injected with  2 cc of 1% Lidocaine. 1080 cc of clear yellow fluid removed.  Pt tolerated procedure well. Sent to radiology for chest xray.  VSS.  Procedure end time 1200.

## 2012-09-25 NOTE — Progress Notes (Signed)
*  PRELIMINARY RESULTS* Echocardiogram 2D Echocardiogram has been performed.  Randy [name] 09/25/2012, 1:56 PM

## 2012-09-25 NOTE — H&P (Signed)
NAMEBRENNON, OTTERNESS NO.:  [id number]  MEDICAL RECORD NO.:  [id number]  LOCATION:  A315                          FACILITY:  [facility]  PHYSICIAN:  Alik Mawson L. Juanetta Gosling, M.D.DATE OF BIRTH:  [birth year]  DATE OF ADMISSION:  09/24/2012 DATE OF DISCHARGE:  LH                             HISTORY & PHYSICAL   REASON FOR ADMISSION:  Pneumonia.  HISTORY:  Mr. Stocking is a 79 year old, who says he has been sick with chest problems for the last 3 months at least.  He has had increasing shortness of breath for the last 3-5 days.  He has been coughing some. He is not sure if he has had any fever.  He has not coughed up any sputum.  He has been using breathing treatments, but they have not helped very much.  He has not had any definite fever.  PAST MEDICAL HISTORY:  Positive for hypertension, congestive heart failure with cardiomyopathy with an ejection fraction of 40-45% at his last test, hyperlipidemia, chronic kidney disease stage IV, anemia of chronic disease.  PAST SURGICAL HISTORY:  He has knee surgery which was an arthroscopy of his right knee.  He had back surgery.  He has had a fistula placed for potential dialysis.  He is not aware of history of lung disease in the family.  There is a history of colon cancer in the family.  SOCIAL HISTORY:  He smoked about a package of day for 30 years and stopped in 1999.  He does not use any alcohol.  REVIEW OF SYSTEMS:  Except as mentioned is essentially negative.  He has been short of breath.  He denies chest pain, hemoptysis.  MEDICATIONS AT HOME: 1. Albuterol by nebulizer 4 times a day as needed. 2. Allopurinol 300 mg daily. 3. Amlodipine 10 mg daily. 4. Calcitriol 0.5 mcg daily. 5. Clonidine 0.2 mg b.i.d. 6. Lasix 40 mg b.i.d. 7. Hydralazine 50 mg daily. 8. Labetalol 200 mg 2 tablets b.i.d. 9. He has been on Levaquin recently. 10.He states multivitamins. 11.He is on prednisone 20 mg b.i.d. 12.Oxycodone 5 mg as needed for  pain. 13.Simvastatin 80 mg at bedtime.  PHYSICAL EXAMINATION:  GENERAL:  Shows that he appears to be short of breath at rest. HEENT:  The pupils are reactive.  Nose and throat are clear. NECK:  Supple without masses. CHEST:  Clear.  He has some prolonged expiration but no definite wheezes now.  HEART:  Regular without murmur, gallop, or rub. ABDOMEN:  Soft without masses. EXTREMITIES:  Pitting edema 2+ bilaterally. NEUROLOGICAL:  He is intact.  Chest x-ray shows what looks like progression of a left lower lobe opacity with a pleural effusion.  He also has a small right pleural effusion.  He has cardiomegaly.  He has elevated BNP.  ASSESSMENT:  He has what appears to be healthcare-associated pneumonia.  PLAN:  Triple antibiotic treatment.  He is going to have another echocardiogram.  He is going to have a CT of the chest without contrast.     Sybilla Malhotra L. Juanetta Gosling, M.D.     ELH/MEDQ  D:  09/24/2012  T:  09/25/2012  Job:  [id number]

## 2012-09-26 LAB — BASIC METABOLIC PANEL
Calcium: 8.7 mg/dL (ref 8.4–10.5)
GFR calc non Af Amer: 10 mL/min — ABNORMAL LOW (ref >90)
Glucose, Bld: 199 mg/dL — ABNORMAL HIGH (ref 70–99)
Sodium: 133 mEq/L — ABNORMAL LOW (ref 135–145)

## 2012-09-26 LAB — HEMOGLOBIN AND HEMATOCRIT, BLOOD: HCT: 26 % — ABNORMAL LOW (ref 39.0–52.0)

## 2012-09-26 LAB — CBC
MCH: 27.7 pg (ref 26.0–34.0)
MCHC: 31.4 g/dL (ref 30.0–36.0)
Platelets: 305 10*3/uL (ref 150–400)
RBC: 2.49 MIL/uL — ABNORMAL LOW (ref 4.22–5.81)
RDW: 16.8 % — ABNORMAL HIGH (ref 11.5–15.5)

## 2012-09-26 LAB — PHOSPHORUS: Phosphorus: 5.7 mg/dL — ABNORMAL HIGH (ref 2.3–4.6)

## 2012-09-26 MED ORDER — ALBUTEROL SULFATE (5 MG/ML) 0.5% IN NEBU
2.5000 mg | INHALATION_SOLUTION | Freq: Four times a day (QID) | RESPIRATORY_TRACT | Status: DC
  Administered 2012-09-26 – 2012-10-01 (×22): 2.5 mg via RESPIRATORY_TRACT
  Filled 2012-09-26 (×22): qty 0.5

## 2012-09-26 MED ORDER — IPRATROPIUM BROMIDE 0.02 % IN SOLN
0.5000 mg | Freq: Four times a day (QID) | RESPIRATORY_TRACT | Status: DC
  Administered 2012-09-26 – 2012-10-01 (×22): 0.5 mg via RESPIRATORY_TRACT
  Filled 2012-09-26 (×21): qty 2.5

## 2012-09-26 MED ORDER — SODIUM CHLORIDE 0.9 % IV SOLN
INTRAVENOUS | Status: DC
  Administered 2012-09-26: 12:00:00 via INTRAVENOUS
  Administered 2012-09-27: 75 mL/h via INTRAVENOUS
  Administered 2012-09-27: 1000 mL via INTRAVENOUS
  Administered 2012-09-28 – 2012-09-30 (×4): via INTRAVENOUS

## 2012-09-26 NOTE — Progress Notes (Signed)
He is substantially better than yesterday. He is much less short of breath. He feels better. His CT of the chest showed that he had a large pleural effusion. I would like to see if he can have a thoracentesis. He is a little more anemic.  He is awake and alert. He looks more comfortable. He is much less short of breath. His chest is clear her with end expiratory wheezes. His heart is regular without gallop. His abdomen is soft without masses. Extremities showed no edema. His vital signs are as recorded  He has a large pleural effusion on going to see about getting a thoracentesis done. He has renal failure and I would ask for nephrology consultation. Continue all his other treatments. Since he is so much improved on not going to change any of his medicines.

## 2012-09-26 NOTE — Progress Notes (Signed)
Notified Dr. Juanetta Gosling that pt is concerned abt taking daily dose of ASA due to being asked about use at thoracentesis yesterday. Order received to d/c ASA at this time. Sheryn Bison

## 2012-09-26 NOTE — Progress Notes (Signed)
Subjective: He continues to improve and says he feels better. He has no new complaints. His breathing is doing well. They help from nephrology is noted and appreciated. He is more anemic this morning.  Objective: Vital signs in last 24 hours: Temp:  [97.4 F (36.3 C)-98.3 F (36.8 C)] 98.3 F (36.8 C) (01/04 7829) Pulse Rate:  [71-88] 71  (01/04 0903) Resp:  [16-20] 20  (01/04 0608) BP: (129-136)/(49-78) 136/62 mmHg (01/04 0903) SpO2:  [89 %-100 %] 89 % (01/04 0656) Weight:  [79 kg (174 lb 2.6 oz)] 79 kg (174 lb 2.6 oz) (01/04 0656) Weight change: -2.648 kg (-5 lb 13.4 oz) Last BM Date: 09/23/12  Intake/Output from previous day: 01/03 0701 - 01/04 0700 In: 840 [P.O.:840] Out: 400 [Urine:400]  PHYSICAL EXAM General appearance: alert, cooperative and no distress Resp: clear to auscultation bilaterally Cardio: regular rate and rhythm, S1, S2 normal, no murmur, click, rub or gallop GI: soft, non-tender; bowel sounds normal; no masses,  no organomegaly Extremities: extremities normal, atraumatic, no cyanosis or edema  Lab Results:    Basic Metabolic Panel:  Basename 09/26/12 0608 09/24/12 1119  NA 133* 139  K 5.2* 4.2  CL 98 104  CO2 24 25  GLUCOSE 199* 153*  BUN 68* 46*  CREATININE 5.17* 3.90*  CALCIUM 8.7 9.1  MG -- --  PHOS 5.7* --   Liver Function Tests: No results found for this basename: AST:2,ALT:2,ALKPHOS:2,BILITOT:2,PROT:2,ALBUMIN:2 in the last 72 hours No results found for this basename: LIPASE:2,AMYLASE:2 in the last 72 hours No results found for this basename: AMMONIA:2 in the last 72 hours CBC:  Basename 09/26/12 0608 09/25/12 0523  WBC 6.4 4.7  NEUTROABS -- --  HGB 6.9* 7.3*  HCT 22.0* 23.2*  MCV 88.4 89.9  PLT 305 317   Cardiac Enzymes:  Basename 09/24/12 1119  CKTOTAL --  CKMB --  CKMBINDEX --  TROPONINI <0.30   BNP:  Basename 09/24/12 1119  PROBNP 14595.0*   D-Dimer: No results found for this basename: DDIMER:2 in the last 72  hours CBG: No results found for this basename: GLUCAP:6 in the last 72 hours Hemoglobin A1C: No results found for this basename: HGBA1C in the last 72 hours Fasting Lipid Panel: No results found for this basename: CHOL,HDL,LDLCALC,TRIG,CHOLHDL,LDLDIRECT in the last 72 hours Thyroid Function Tests: No results found for this basename: TSH,T4TOTAL,FREET4,T3FREE,THYROIDAB in the last 72 hours Anemia Panel: No results found for this basename: VITAMINB12,FOLATE,FERRITIN,TIBC,IRON,RETICCTPCT in the last 72 hours Coagulation: No results found for this basename: LABPROT:2,INR:2 in the last 72 hours Urine Drug Screen: Drugs of Abuse  No results found for this basename: labopia, cocainscrnur, labbenz, amphetmu, thcu, labbarb    Alcohol Level: No results found for this basename: ETH:2 in the last 72 hours Urinalysis: No results found for this basename: COLORURINE:2,APPERANCEUR:2,LABSPEC:2,PHURINE:2,GLUCOSEU:2,HGBUR:2,BILIRUBINUR:2,KETONESUR:2,PROTEINUR:2,UROBILINOGEN:2,NITRITE:2,LEUKOCYTESUR:2 in the last 72 hours Misc. Labs:  ABGS No results found for this basename: PHART,PCO2,PO2ART,TCO2,HCO3 in the last 72 hours CULTURES No results found for this or any previous visit (from the past 240 hour(s)). Studies/Results: Dg Chest 1 View  09/25/2012  *RADIOLOGY REPORT*  Clinical Data: Post right thoracentesis.  CHEST - 1 VIEW  Comparison: 09/25/1947  Findings: Improving right pleural effusion following thoracentesis. No pneumothorax.  Continued moderate left pleural effusion and left lower lobe atelectasis.  Heart is mildly enlarged, stable.  IMPRESSION: Decreasing right effusion following thoracentesis.  No pneumothorax.  Otherwise no change.   Original Report Authenticated By: Charlett Nose, M.D.    Dg Chest 2 View  09/24/2012  *  RADIOLOGY REPORT*  Clinical Data: Dyspnea, history of COPD  CHEST - 2 VIEW  Comparison: Most recent prior chest x-ray 08/26/2012  Findings: Persistent dense left basilar opacity  with new left larger than right pleural effusions.  Right retrocardiac and basilar opacities favored to reflect atelectasis.  Unchanged cardiomegaly.  Aortic atherosclerosis.  The upper lungs are well- aerated.  No acute osseous abnormality.  IMPRESSION:  1. Progression of dense left lower lobe opacity now with likely small associated pleural effusion.  Findings remain concerning for persistent versus recurrent pneumonia. Recommend imaging followup to resolution. 2.  New small right pleural effusion and right basilar retrocardiac atelectasis 3.  Stable cardiomegaly   Original Report Authenticated By: Malachy Moan, M.D.    Ct Chest Wo Contrast  09/24/2012  *RADIOLOGY REPORT*  Clinical Data: Left lower lobe infiltrate.  CT CHEST WITHOUT CONTRAST  Technique:  Multidetector CT imaging of the chest was performed following the standard protocol without IV contrast.  Comparison: Chest x-ray dated 09/24/2012  Findings: There are large bilateral pleural effusions with compressive atelectasis of most of the left lower lobe and upper portion of the right lower lobe.  There is slight interstitial edema. Borderline cardiomegaly.  Extensive coronary artery calcification.  No mass lesions or appreciable adenopathy.  There is a partially cystic nodule in the posterior aspect of the left lobe of the thyroid gland measuring 2 cm in diameter. Remainder of the gland is atrophic.  The visualized portion of the upper abdomen demonstrates a cyst on the upper pole right kidney.  No visible ascites.  No acute osseous abnormality.  IMPRESSION:  1.  Large bilateral pleural effusions with secondary compressive atelectasis particularly of the left lower lobe. 2.  Borderline cardiomegaly with mild interstitial edema.   Original Report Authenticated By: Francene Boyers, M.D.     Medications:  Prior to Admission:  Prescriptions prior to admission  Medication Sig Dispense Refill  . albuterol (PROVENTIL) (5 MG/ML) 0.5% nebulizer solution  Take 0.5 mLs (2.5 mg total) by nebulization 4 (four) times daily.  20 mL  2  . amLODipine (NORVASC) 10 MG tablet Take 10 mg by mouth every morning.       . calcitRIOL (ROCALTROL) 0.5 MCG capsule Take 1 capsule (0.5 mcg total) by mouth daily.  30 capsule  3  . cloNIDine (CATAPRES) 0.2 MG tablet Take 1 tablet (0.2 mg total) by mouth 2 (two) times daily.  60 tablet  3  . furosemide (LASIX) 40 MG tablet Take 40 mg by mouth 2 (two) times daily.      . hydrALAZINE (APRESOLINE) 50 MG tablet Take 1 tablet (50 mg total) by mouth 3 (three) times daily.  90 tablet  3  . labetalol (NORMODYNE) 200 MG tablet Take 2 tablets (400 mg total) by mouth 2 (two) times daily.  120 tablet  6  . Multiple Vitamin (MULITIVITAMIN WITH MINERALS) TABS Take 1 tablet by mouth daily.      Marland Kitchen oxyCODONE (ROXICODONE) 5 MG immediate release tablet Take 1 tablet (5 mg total) by mouth every 4 (four) hours as needed for pain.  30 tablet  0  . simvastatin (ZOCOR) 80 MG tablet Take 80 mg by mouth at bedtime.        Marland Kitchen levofloxacin (LEVAQUIN) 500 MG tablet Take 1 tablet (500 mg total) by mouth daily.  5 tablet  0  . predniSONE (DELTASONE) 20 MG tablet Take 1 tablet (20 mg total) by mouth 2 (two) times daily with a meal.  Scheduled:   . albuterol  2.5 mg Nebulization QID  . amLODipine  10 mg Oral q morning - 10a  . aspirin EC  325 mg Oral Daily  . atorvastatin  40 mg Oral q1800  . budesonide-formoterol  2 puff Inhalation BID  . calcitRIOL  0.5 mcg Oral Daily  . ceFEPime (MAXIPIME) IV  1 g Intravenous Q24H  . cloNIDine  0.2 mg Oral BID  . darbepoetin  60 mcg Subcutaneous Q14 Days  . enoxaparin (LOVENOX) injection  30 mg Subcutaneous Q24H  . furosemide  40 mg Oral BID  . hydrALAZINE  50 mg Oral TID  . ipratropium  0.5 mg Nebulization QID  . labetalol  400 mg Oral BID  . levofloxacin (LEVAQUIN) IV  500 mg Intravenous Q48H  . methylPREDNISolone (SOLU-MEDROL) injection  125 mg Intravenous Q6H  . multivitamin with minerals  1  tablet Oral Daily  . sodium chloride  3 mL Intravenous Q12H  . vancomycin  1,000 mg Intravenous Q48H   Continuous:   . sodium chloride     YNW:GNFAOZ chloride, acetaminophen, acetaminophen, albuterol, HYDROcodone-homatropine, ondansetron (ZOFRAN) IV, ondansetron, oxyCODONE, sodium chloride, traZODone  Assesment: He has COPD exacerbation. He is much improved as far as his breathing is concerned. He has what looks like a pneumonia. He had a large pleural effusion. He has chronic renal failure which is about the same. He is more anemic and I discussed that with nephrology and we plan to go and transfuse him Active Problems:  * No active hospital problems. *     Plan: For transfusion today continue his other treatments.    LOS: 2 days   Narelle Schoening L 09/26/2012, 10:17 AM

## 2012-09-26 NOTE — Progress Notes (Signed)
CRITICAL VALUE ALERT  Critical value received:  Hemoglobin  Date of notification: 09/27/11  Time of notification: 0734  Critical value read back:yes  Nurse who received alert:  Deberah Castle, RN  MD notified (1st page): Juanetta Gosling  Time of first page:  [phone]  MD notified (2nd page):  Time of second page:  Responding MD:  Juanetta Gosling  Time MD responded: [phone]

## 2012-09-26 NOTE — Progress Notes (Signed)
Subjective: Interval History: has no complaint of nausea or vomiting. His breathing is better. Patient says that she's feeling better. .  Objective: Vital signs in last 24 hours: Temp:  [97.4 F (36.3 C)-98.3 F (36.8 C)] 98.3 F (36.8 C) (01/04 1610) Pulse Rate:  [71-88] 71  (01/04 0903) Resp:  [16-20] 20  (01/04 0608) BP: (129-136)/(49-78) 136/62 mmHg (01/04 0903) SpO2:  [89 %-100 %] 89 % (01/04 0656) Weight:  [79 kg (174 lb 2.6 oz)] 79 kg (174 lb 2.6 oz) (01/04 0656) Weight change: -2.648 kg (-5 lb 13.4 oz)  Intake/Output from previous day: 01/03 0701 - 01/04 0700 In: 840 [P.O.:840] Out: 400 [Urine:400] Intake/Output this shift: Total I/O In: 480 [P.O.:480] Out: -   General appearance: alert, cooperative and no distress Resp: clear to auscultation bilaterally Cardio: regular rate and rhythm, S1, S2 normal, no murmur, click, rub or gallop GI: soft, non-tender; bowel sounds normal; no masses,  no organomegaly Extremities: extremities normal, atraumatic, no cyanosis or edema and Homans sign is negative, no sign of DVT  Lab Results:  [facility] 09/26/12 0608 09/25/12 0523  WBC 6.4 4.7  HGB 6.9* 7.3*  HCT 22.0* 23.2*  PLT 305 317   BMET:  Basename 09/26/12 0608 09/24/12 1119  NA 133* 139  K 5.2* 4.2  CL 98 104  CO2 24 25  GLUCOSE 199* 153*  BUN 68* 46*  CREATININE 5.17* 3.90*  CALCIUM 8.7 9.1   No results found for this basename: PTH:2 in the last 72 hours Iron Studies: No results found for this basename: IRON,TIBC,TRANSFERRIN,FERRITIN in the last 72 hours  Studies/Results: Dg Chest 1 View  09/25/2012  *RADIOLOGY REPORT*  Clinical Data: Post right thoracentesis.  CHEST - 1 VIEW  Comparison: 09/25/1945  Findings: Improving right pleural effusion following thoracentesis. No pneumothorax.  Continued moderate left pleural effusion and left lower lobe atelectasis.  Heart is mildly enlarged, stable.  IMPRESSION: Decreasing right effusion following thoracentesis.  No  pneumothorax.  Otherwise no change.   Original Report Authenticated By: Charlett Nose, M.D.    Dg Chest 2 View  09/24/2012  *RADIOLOGY REPORT*  Clinical Data: Dyspnea, history of COPD  CHEST - 2 VIEW  Comparison: Most recent prior chest x-ray 08/26/2012  Findings: Persistent dense left basilar opacity with new left larger than right pleural effusions.  Right retrocardiac and basilar opacities favored to reflect atelectasis.  Unchanged cardiomegaly.  Aortic atherosclerosis.  The upper lungs are well- aerated.  No acute osseous abnormality.  IMPRESSION:  1. Progression of dense left lower lobe opacity now with likely small associated pleural effusion.  Findings remain concerning for persistent versus recurrent pneumonia. Recommend imaging followup to resolution. 2.  New small right pleural effusion and right basilar retrocardiac atelectasis 3.  Stable cardiomegaly   Original Report Authenticated By: Malachy Moan, M.D.    Ct Chest Wo Contrast  09/24/2012  *RADIOLOGY REPORT*  Clinical Data: Left lower lobe infiltrate.  CT CHEST WITHOUT CONTRAST  Technique:  Multidetector CT imaging of the chest was performed following the standard protocol without IV contrast.  Comparison: Chest x-ray dated 09/24/2012  Findings: There are large bilateral pleural effusions with compressive atelectasis of most of the left lower lobe and upper portion of the right lower lobe.  There is slight interstitial edema. Borderline cardiomegaly.  Extensive coronary artery calcification.  No mass lesions or appreciable adenopathy.  There is a partially cystic nodule in the posterior aspect of the left lobe of the thyroid gland measuring 2 cm in diameter.  Remainder of the gland is atrophic.  The visualized portion of the upper abdomen demonstrates a cyst on the upper pole right kidney.  No visible ascites.  No acute osseous abnormality.  IMPRESSION:  1.  Large bilateral pleural effusions with secondary compressive atelectasis particularly of  the left lower lobe. 2.  Borderline cardiomegaly with mild interstitial edema.   Original Report Authenticated By: Francene Boyers, M.D.     I have reviewed the patient's current medications.  Assessment/Plan: Problem #1 renal failure acute on chronic his BUN and creatinine seems to be increasing. Patient has this moment does not have any uremic sinus symptoms.  Problem #2 anemia his hemoglobin hematocrit seems to be declining. Problem #3 pleural effusion status post thoracentesis presently seems to be feeling better. Problem #4 history of hypertension his blood pressure is controlled Problem #5 history of hyperlipidemia  Problem #6 metabolic bone disease phosphorus is slightly high otherwise calcium was in acceptable range. Problem #7 history of chronic back pain Problem #8 history of peripheral vascular disease. Plan: Agree with blood transfusion Will start patient on normal saline at 75 cc per hour We'll check his basic metabolic panel in the morning.   LOS: 2 days   Randy [name] S 09/26/2012,9:49 AM

## 2012-09-27 LAB — TYPE AND SCREEN

## 2012-09-27 LAB — BASIC METABOLIC PANEL
CO2: 20 mEq/L (ref 19–32)
Chloride: 96 mEq/L (ref 96–112)
Glucose, Bld: 206 mg/dL — ABNORMAL HIGH (ref 70–99)
Potassium: 4.8 mEq/L (ref 3.5–5.1)
Sodium: 129 mEq/L — ABNORMAL LOW (ref 135–145)

## 2012-09-27 LAB — CBC
Hemoglobin: 9.2 g/dL — ABNORMAL LOW (ref 13.0–17.0)
MCH: 27.9 pg (ref 26.0–34.0)
MCV: 85.2 fL (ref 78.0–100.0)
RBC: 3.3 MIL/uL — ABNORMAL LOW (ref 4.22–5.81)

## 2012-09-27 MED ORDER — BISACODYL 10 MG RE SUPP
10.0000 mg | Freq: Every day | RECTAL | Status: DC | PRN
  Administered 2012-09-27: 10 mg via RECTAL
  Filled 2012-09-27: qty 1

## 2012-09-27 NOTE — Progress Notes (Signed)
Subjective: He says he feels much better. He has no new complaints. His breathing is improved.  Objective: Vital signs in last 24 hours: Temp:  [96.4 F (35.8 C)-98.5 F (36.9 C)] 98.5 F (36.9 C) (01/05 0550) Pulse Rate:  [71-81] 79  (01/04 2140) Resp:  [12-18] 18  (01/05 0550) BP: (131-189)/(62-82) 156/76 mmHg (01/05 0550) SpO2:  [97 %-100 %] 99 % (01/05 0726) Weight:  [82.8 kg (182 lb 8.7 oz)] 82.8 kg (182 lb 8.7 oz) (01/05 0550) Weight change: 3.8 kg (8 lb 6 oz) Last BM Date: 09/23/12  Intake/Output from previous day: 01/04 0701 - 01/05 0700 In: 1860 [P.O.:960; I.V.:500; Blood:400] Out: 1075 [Urine:1075]  PHYSICAL EXAM General appearance: alert, cooperative and mild distress Resp: clear to auscultation bilaterally Cardio: regular rate and rhythm, S1, S2 normal, no murmur, click, rub or gallop GI: soft, non-tender; bowel sounds normal; no masses,  no organomegaly Extremities: extremities normal, atraumatic, no cyanosis or edema  Lab Results:    Basic Metabolic Panel:  Basename 09/27/12 0633 09/26/12 0608  NA 129* 133*  K 4.8 5.2*  CL 96 98  CO2 20 24  GLUCOSE 206* 199*  BUN 79* 68*  CREATININE 5.24* 5.17*  CALCIUM 8.6 8.7  MG -- --  PHOS -- 5.7*   Liver Function Tests: No results found for this basename: AST:2,ALT:2,ALKPHOS:2,BILITOT:2,PROT:2,ALBUMIN:2 in the last 72 hours No results found for this basename: LIPASE:2,AMYLASE:2 in the last 72 hours No results found for this basename: AMMONIA:2 in the last 72 hours CBC:  Basename 09/27/12 0633 09/26/12 2122 09/26/12 0608  WBC 7.3 -- 6.4  NEUTROABS -- -- --  HGB 9.2* 8.5* --  HCT 28.1* 26.0* --  MCV 85.2 -- 88.4  PLT 320 -- 305   Cardiac Enzymes:  Basename 09/24/12 1119  CKTOTAL --  CKMB --  CKMBINDEX --  TROPONINI <0.30   BNP:  Basename 09/24/12 1119  PROBNP 14595.0*   D-Dimer: No results found for this basename: DDIMER:2 in the last 72 hours CBG: No results found for this basename:  GLUCAP:6 in the last 72 hours Hemoglobin A1C: No results found for this basename: HGBA1C in the last 72 hours Fasting Lipid Panel: No results found for this basename: CHOL,HDL,LDLCALC,TRIG,CHOLHDL,LDLDIRECT in the last 72 hours Thyroid Function Tests: No results found for this basename: TSH,T4TOTAL,FREET4,T3FREE,THYROIDAB in the last 72 hours Anemia Panel: No results found for this basename: VITAMINB12,FOLATE,FERRITIN,TIBC,IRON,RETICCTPCT in the last 72 hours Coagulation: No results found for this basename: LABPROT:2,INR:2 in the last 72 hours Urine Drug Screen: Drugs of Abuse  No results found for this basename: labopia, cocainscrnur, labbenz, amphetmu, thcu, labbarb    Alcohol Level: No results found for this basename: ETH:2 in the last 72 hours Urinalysis: No results found for this basename: COLORURINE:2,APPERANCEUR:2,LABSPEC:2,PHURINE:2,GLUCOSEU:2,HGBUR:2,BILIRUBINUR:2,KETONESUR:2,PROTEINUR:2,UROBILINOGEN:2,NITRITE:2,LEUKOCYTESUR:2 in the last 72 hours Misc. Labs:  ABGS No results found for this basename: PHART,PCO2,PO2ART,TCO2,HCO3 in the last 72 hours CULTURES Recent Results (from the past 240 hour(s))  AFB CULTURE WITH SMEAR     Status: Normal (Preliminary result)   Collection Time   09/25/12 12:00 PM      Component Value Range Status Comment   Specimen Description PLEURAL   Final    Special Requests NONE   Final    ACID FAST SMEAR NO ACID FAST BACILLI SEEN   Final    Culture     Final    Value: CULTURE WILL BE EXAMINED FOR 6 WEEKS BEFORE ISSUING A FINAL REPORT   Report Status PENDING   Incomplete   BODY  FLUID CULTURE     Status: Normal (Preliminary result)   Collection Time   09/25/12 12:00 PM      Component Value Range Status Comment   Specimen Description PLEURAL   Final    Special Requests NONE   Final    Gram Stain     Final    Value: NO WBC SEEN     NO ORGANISMS SEEN   Culture NO GROWTH 1 DAY   Final    Report Status PENDING   Incomplete    Studies/Results: Dg  Chest 1 View  09/25/2012  *RADIOLOGY REPORT*  Clinical Data: Post right thoracentesis.  CHEST - 1 VIEW  Comparison: 09/25/1945  Findings: Improving right pleural effusion following thoracentesis. No pneumothorax.  Continued moderate left pleural effusion and left lower lobe atelectasis.  Heart is mildly enlarged, stable.  IMPRESSION: Decreasing right effusion following thoracentesis.  No pneumothorax.  Otherwise no change.   Original Report Authenticated By: Charlett Nose, M.D.     Medications:  Prior to Admission:  Prescriptions prior to admission  Medication Sig Dispense Refill  . albuterol (PROVENTIL) (5 MG/ML) 0.5% nebulizer solution Take 0.5 mLs (2.5 mg total) by nebulization 4 (four) times daily.  20 mL  2  . amLODipine (NORVASC) 10 MG tablet Take 10 mg by mouth every morning.       . calcitRIOL (ROCALTROL) 0.5 MCG capsule Take 1 capsule (0.5 mcg total) by mouth daily.  30 capsule  3  . cloNIDine (CATAPRES) 0.2 MG tablet Take 1 tablet (0.2 mg total) by mouth 2 (two) times daily.  60 tablet  3  . furosemide (LASIX) 40 MG tablet Take 40 mg by mouth 2 (two) times daily.      . hydrALAZINE (APRESOLINE) 50 MG tablet Take 1 tablet (50 mg total) by mouth 3 (three) times daily.  90 tablet  3  . labetalol (NORMODYNE) 200 MG tablet Take 2 tablets (400 mg total) by mouth 2 (two) times daily.  120 tablet  6  . Multiple Vitamin (MULITIVITAMIN WITH MINERALS) TABS Take 1 tablet by mouth daily.      Marland Kitchen oxyCODONE (ROXICODONE) 5 MG immediate release tablet Take 1 tablet (5 mg total) by mouth every 4 (four) hours as needed for pain.  30 tablet  0  . simvastatin (ZOCOR) 80 MG tablet Take 80 mg by mouth at bedtime.        Marland Kitchen levofloxacin (LEVAQUIN) 500 MG tablet Take 1 tablet (500 mg total) by mouth daily.  5 tablet  0  . predniSONE (DELTASONE) 20 MG tablet Take 1 tablet (20 mg total) by mouth 2 (two) times daily with a meal.       Scheduled:   . albuterol  2.5 mg Nebulization QID  . amLODipine  10 mg Oral q  morning - 10a  . atorvastatin  40 mg Oral q1800  . budesonide-formoterol  2 puff Inhalation BID  . calcitRIOL  0.5 mcg Oral Daily  . ceFEPime (MAXIPIME) IV  1 g Intravenous Q24H  . cloNIDine  0.2 mg Oral BID  . darbepoetin  60 mcg Subcutaneous Q14 Days  . enoxaparin (LOVENOX) injection  30 mg Subcutaneous Q24H  . furosemide  40 mg Oral BID  . hydrALAZINE  50 mg Oral TID  . ipratropium  0.5 mg Nebulization QID  . labetalol  400 mg Oral BID  . levofloxacin (LEVAQUIN) IV  500 mg Intravenous Q48H  . methylPREDNISolone (SOLU-MEDROL) injection  125 mg Intravenous Q6H  . multivitamin with  minerals  1 tablet Oral Daily  . sodium chloride  3 mL Intravenous Q12H  . vancomycin  1,000 mg Intravenous Q48H   Continuous:   . sodium chloride 75 mL/hr at 09/26/12 1154   AVW:UJWJXB chloride, acetaminophen, acetaminophen, albuterol, HYDROcodone-homatropine, ondansetron (ZOFRAN) IV, ondansetron, oxyCODONE, sodium chloride, traZODone  Assesment: He is much improved. He is being treated for healthcare associated pneumonia/COPD. His renal function is unchanged Active Problems:  * No active hospital problems. *     Plan: Continue with current treatments.    LOS: 3 days   Keonta Monceaux L 09/27/2012, 8:39 AM

## 2012-09-27 NOTE — Progress Notes (Signed)
Notified Dr. Juanetta Gosling of pt c/o discomfort of his stomach.  States he feels constipated last good BM according to pt was 09/23/12. New orders given and followed.

## 2012-09-27 NOTE — Progress Notes (Signed)
Subjective: Interval History: has no complaint of nausea or vomiting. Patient says that his breathing is better and he doesn't have any cough or chest pain.  Objective: Vital signs in last 24 hours: Temp:  [96.4 F (35.8 C)-98.5 F (36.9 C)] 98.5 F (36.9 C) (01/05 0550) Pulse Rate:  [74-81] 79  (01/04 2140) Resp:  [12-18] 18  (01/05 0550) BP: (131-189)/(68-82) 156/76 mmHg (01/05 0550) SpO2:  [97 %-100 %] 99 % (01/05 0726) Weight:  [82.8 kg (182 lb 8.7 oz)] 82.8 kg (182 lb 8.7 oz) (01/05 0550) Weight change: 3.8 kg (8 lb 6 oz)  Intake/Output from previous day: 01/04 0701 - 01/05 0700 In: 1860 [P.O.:960; I.V.:500; Blood:400] Out: 1075 [Urine:1075] Intake/Output this shift:    General appearance: alert, cooperative and no distress Resp: diminished breath sounds bilaterally and posterior - bilateral and rhonchi posterior - right and RUL Cardio: regular rate and rhythm, S1, S2 normal, no murmur, click, rub or gallop GI: soft, non-tender; bowel sounds normal; no masses,  no organomegaly Extremities: extremities normal, atraumatic, no cyanosis or edema  Lab Results:  [facility] 09/27/12 0633 09/26/12 2122 09/26/12 0608  WBC 7.3 -- 6.4  HGB 9.2* 8.5* --  HCT 28.1* 26.0* --  PLT 320 -- 305   BMET:  Basename 09/27/12 0633 09/26/12 0608  NA 129* 133*  K 4.8 5.2*  CL 96 98  CO2 20 24  GLUCOSE 206* 199*  BUN 79* 68*  CREATININE 5.24* 5.17*  CALCIUM 8.6 8.7   No results found for this basename: PTH:2 in the last 72 hours Iron Studies: No results found for this basename: IRON,TIBC,TRANSFERRIN,FERRITIN in the last 72 hours  Studies/Results: Dg Chest 1 View  09/25/2012  *RADIOLOGY REPORT*  Clinical Data: Post right thoracentesis.  CHEST - 1 VIEW  Comparison: 09/25/1945  Findings: Improving right pleural effusion following thoracentesis. No pneumothorax.  Continued moderate left pleural effusion and left lower lobe atelectasis.  Heart is mildly enlarged, stable.  IMPRESSION:  Decreasing right effusion following thoracentesis.  No pneumothorax.  Otherwise no change.   Original Report Authenticated By: Charlett Nose, M.D.     I have reviewed the patient's current medications.  Assessment/Plan:  Problem #1 renal failure acute on chronic his pedis 79 creatinine is 5.24 renal function still seems to be declining. However the rate of increase he slow down. Patient presently is none oliguric and he doesn't have any uremic sinus symptoms. Potassium is 4.8. Problem #2 anemia hemoglobin 9.2 hematocrit 28.1 he status post blood transfusion has improved. Problem #3 metabolic bone disease his phosphorus is slightly high but calcium is normal. He is also on Rocaltrol because of secondary hyperparathyroidism. Problem #4 pleural effusion bilateral he status post thoracentesis. Patient seems to be feeling better. Problem #5 history of hypertension blood pressure seems to be reasonably controlled Problem #6 history of chronic low back pain secondary to arthritis Problem #7 history of peripheral vascular disease Problem #8 hyperlipidemia. Plan: We'll continue his present management We'll check his basic metabolic panel and phosphorus in the morning.    LOS: 3 days   Randy [name] S 09/27/2012,9:04 AM

## 2012-09-27 NOTE — Progress Notes (Signed)
ANTIBIOTIC CONSULT NOTE - INITIAL  Pharmacy Consult for Vancomycin, Cefepime, Levaquin Indication: pneumonia  No Known Allergies  Patient Measurements: Height: 5\' 9"  (175.3 cm) Weight: 182 lb 8.7 oz (82.8 kg) IBW/kg (Calculated) : 70.7   Vital Signs: Temp: 98.5 F (36.9 C) (01/05 0550) Temp src: Oral (01/05 0550) BP: 156/76 mmHg (01/05 0550) Intake/Output from previous day: 01/04 0701 - 01/05 0700 In: 1860 [P.O.:960; I.V.:500; Blood:400] Out: 1075 [Urine:1075] Intake/Output from this shift:    Labs:  Basename 09/27/12 0633 09/26/12 2122 09/26/12 0608 09/25/12 0523 09/24/12 1119  WBC 7.3 -- 6.4 4.7 --  HGB 9.2* 8.5* 6.9* -- --  PLT 320 -- 305 317 --  LABCREA -- -- -- -- --  CREATININE 5.24* -- 5.17* -- 3.90*   Estimated Creatinine Clearance: 11.6 ml/min (by C-G formula based on Cr of 5.24). No results found for this basename: VANCOTROUGH:2,VANCOPEAK:2,VANCORANDOM:2,GENTTROUGH:2,GENTPEAK:2,GENTRANDOM:2,TOBRATROUGH:2,TOBRAPEAK:2,TOBRARND:2,AMIKACINPEAK:2,AMIKACINTROU:2,AMIKACIN:2, in the last 72 hours   Microbiology: Recent Results (from the past 720 hour(s))  AFB CULTURE WITH SMEAR     Status: Normal (Preliminary result)   Collection Time   09/25/12 12:00 PM      Component Value Range Status Comment   Specimen Description PLEURAL   Final    Special Requests NONE   Final    ACID FAST SMEAR NO ACID FAST BACILLI SEEN   Final    Culture     Final    Value: CULTURE WILL BE EXAMINED FOR 6 WEEKS BEFORE ISSUING A FINAL REPORT   Report Status PENDING   Incomplete   BODY FLUID CULTURE     Status: Normal (Preliminary result)   Collection Time   09/25/12 12:00 PM      Component Value Range Status Comment   Specimen Description PLEURAL   Final    Special Requests NONE   Final    Gram Stain     Final    Value: NO WBC SEEN     NO ORGANISMS SEEN   Culture NO GROWTH 1 DAY   Final    Report Status PENDING   Incomplete    Medical History: Past Medical History  Diagnosis Date  .  Hypertensive heart disease   . Hypercholesterolemia   . Gout   . Secondary cardiomyopathy     LVEF 40-45%  . Anemia of chronic disease   . Hypertension   . CHF (congestive heart failure)   . Shortness of breath   . Arthritis   . CKD (chronic kidney disease) stage 4, GFR 15-29 ml/min    Medications:  Scheduled:     . albuterol  2.5 mg Nebulization QID  . amLODipine  10 mg Oral q morning - 10a  . atorvastatin  40 mg Oral q1800  . budesonide-formoterol  2 puff Inhalation BID  . calcitRIOL  0.5 mcg Oral Daily  . ceFEPime (MAXIPIME) IV  1 g Intravenous Q24H  . cloNIDine  0.2 mg Oral BID  . darbepoetin  60 mcg Subcutaneous Q14 Days  . enoxaparin (LOVENOX) injection  30 mg Subcutaneous Q24H  . furosemide  40 mg Oral BID  . hydrALAZINE  50 mg Oral TID  . ipratropium  0.5 mg Nebulization QID  . labetalol  400 mg Oral BID  . levofloxacin (LEVAQUIN) IV  500 mg Intravenous Q48H  . methylPREDNISolone (SOLU-MEDROL) injection  125 mg Intravenous Q6H  . multivitamin with minerals  1 tablet Oral Daily  . sodium chloride  3 mL Intravenous Q12H  . vancomycin  1,000 mg Intravenous Q48H  . [  DISCONTINUED] aspirin EC  325 mg Oral Daily   Assessment: 79 yo M admit with possible PNA.  He was admitted & treated for the same ~1 month ago.  Starting broad-spectrum antibiotics to coverage health-care acquired pathogens.  He has Stage 4 CKD, Scr is rising.  Nephrology is following.  Goal of Therapy:  Vancomycin trough level 15-20 mcg/ml  Plan:  1) Cefepime 2gm IV x1 then 1gm IV q24h 2) Levaquin 750mg  IV x1 dose then 500mg  IV Q48h 3) Vancomycin 1500mg  IV x1 then 1gm IV q48h 4) Check Vancomycin trough at steady state 5) Monitor renal function and cx data   Valrie Hart A 09/27/2012,10:06 AM

## 2012-09-28 ENCOUNTER — Inpatient Hospital Stay (HOSPITAL_COMMUNITY): Payer: Medicare Other

## 2012-09-28 LAB — CBC
HCT: 27.2 % — ABNORMAL LOW (ref 39.0–52.0)
Hemoglobin: 8.9 g/dL — ABNORMAL LOW (ref 13.0–17.0)
MCH: 28 pg (ref 26.0–34.0)
MCHC: 32.7 g/dL (ref 30.0–36.0)
MCV: 85.5 fL (ref 78.0–100.0)

## 2012-09-28 LAB — BASIC METABOLIC PANEL
BUN: 91 mg/dL — ABNORMAL HIGH (ref 6–23)
CO2: 21 mEq/L (ref 19–32)
GFR calc non Af Amer: 9 mL/min — ABNORMAL LOW (ref >90)
Glucose, Bld: 216 mg/dL — ABNORMAL HIGH (ref 70–99)
Potassium: 5.2 mEq/L — ABNORMAL HIGH (ref 3.5–5.1)

## 2012-09-28 LAB — PHOSPHORUS: Phosphorus: 5.2 mg/dL — ABNORMAL HIGH (ref 2.3–4.6)

## 2012-09-28 LAB — PATHOLOGIST SMEAR REVIEW: Path Review: REACTIVE

## 2012-09-28 MED ORDER — FUROSEMIDE 10 MG/ML IJ SOLN
100.0000 mg | Freq: Two times a day (BID) | INTRAMUSCULAR | Status: DC
  Administered 2012-09-28 – 2012-10-01 (×7): 100 mg via INTRAVENOUS
  Filled 2012-09-28 (×10): qty 10

## 2012-09-28 NOTE — Progress Notes (Signed)
[id number] 

## 2012-09-28 NOTE — Progress Notes (Signed)
Subjective: Interval History: has no complaint of difficulty in breathing. He denies orthopnea or paroxysmal nocturnal dyspnea. Patient also does not have any nausea or vomiting..  Objective: Vital signs in last 24 hours: Temp:  [97.7 F (36.5 C)-98.2 F (36.8 C)] 98.2 F (36.8 C) (01/06 7829) Pulse Rate:  [76-83] 83  (01/06 0637) Resp:  [20] 20  (01/06 0637) BP: (137-151)/(71-85) 150/85 mmHg (01/06 0637) SpO2:  [96 %-99 %] 98 % (01/06 0637) Weight change:   Intake/Output from previous day: 01/05 0701 - 01/06 0700 In: 1000 [P.O.:1000] Out: 900 [Urine:900] Intake/Output this shift: Total I/O In: -  Out: 500 [Urine:500]  General appearance: alert, cooperative and no distress Resp: clear to auscultation bilaterally Cardio: regular rate and rhythm, S1, S2 normal, no murmur, click, rub or gallop GI: soft, non-tender; bowel sounds normal; no masses,  no organomegaly Extremities: extremities normal, atraumatic, no cyanosis or edema  Lab Results:  [facility] 09/28/12 0518 09/27/12 0633  WBC 6.6 7.3  HGB 8.9* 9.2*  HCT 27.2* 28.1*  PLT 295 320   BMET:  Basename 09/28/12 0518 09/27/12 0633  NA 131* 129*  K 5.2* 4.8  CL 99 96  CO2 21 20  GLUCOSE 216* 206*  BUN 91* 79*  CREATININE 5.22* 5.24*  CALCIUM 8.1* 8.6   No results found for this basename: PTH:2 in the last 72 hours Iron Studies: No results found for this basename: IRON,TIBC,TRANSFERRIN,FERRITIN in the last 72 hours  Studies/Results: No results found.  I have reviewed the patient's current medications.  Assessment/Plan: Problem #1 renal failure chronic his BUN and creatinine is 91 and 5.2 2 seems to be stabilizing. Patient does not have any uremic sinus symptoms. His potassium is 5.2 and patient is none oliguric. Problem #2 anemia his hemoglobin 8.9 hematocrit 27.2 Problem #3 hypertension blood pressure seems to be controlled very well Problem #4 history of pleural effusion Problem #5 history of COPD Problem  #6 metabolic bone disease patient presently on Rocaltrol and also PhosLo. Problem #7 history of peripheral vascular disease Problem #8 hyperlipidemia. Plan: We'll continue his hydration and will start on Lasix to improve his urine output. Presently patient doesn't need any dialysis and has a fistula. We'll check his basic metabolic panel and CBC in the morning.    LOS: 4 days   Falisha Osment S 09/28/2012,6:46 AM

## 2012-09-28 NOTE — Progress Notes (Signed)
Inpatient Diabetes Program Recommendations  AACE/ADA: New Consensus Statement on Inpatient Glycemic Control (2013)  Target Ranges:  Prepandial:   less than 140 mg/dL      Peak postprandial:   less than 180 mg/dL (1-2 hours)      Critically ill patients:  140 - 180 mg/dL   Results for AHARON, CARRIERE (MRN [id number]) as of 09/28/2012 09:22  Ref. Range 09/24/2012 11:19 09/26/2012 06:08 09/27/2012 06:33 09/28/2012 05:18  Glucose Latest Range: 70-99 mg/dL 409 (H) 811 (H) 914 (H) 216 (H)    Inpatient Diabetes Program Recommendations Correction (SSI): Please consider ordering CBGs ACHS with Novolog sensitive correction if needed while patient is on steroids. HgbA1C: Please consider ordering an A1C.  The last A1C in the chart was 6.2% on 06-23-2012.  Note: Patient does not have a documented history of diabetes.  However, blood glucose has been elevated since admission likely due to steroids.  Please consider ordering an A1C and checking blood glucose ACHS with Novolog sensitive correction.  Patient has chronic kidney failure and is not yet requiring dialysis which is why I recommend starting Novolog sensitive correction versus moderate or resistant.  Will continue to follow.  Thanks, Orlando Penner, RN, BSN, CCRN Diabetes Coordinator Inpatient Diabetes Program [phone]

## 2012-09-28 NOTE — Progress Notes (Signed)
[name], [name] NO.:  [id number]  MEDICAL RECORD NO.:  [id number]  LOCATION:  A315                          FACILITY:  [facility]  PHYSICIAN:  Melvyn Novas, MDDATE OF BIRTH:  1931-12-23  DATE OF PROCEDURE: DATE OF DISCHARGE:                                PROGRESS NOTE   The patient has acute on chronic renal failure, hypertension, hyperlipidemia, volume overload, bilateral pleural effusion, status post thoracentesis on the right 2 days ago, has possible hospital-acquired pneumonia.  Currently on Maxipime, Levaquin and vancomycin.  He likewise has anemia with hemoglobin of 8.9, currently is stable.  Creatinine 5.22.  Blood pressure is 150/85, temperature 98.2, pulse 83 and regular, respiratory rate is 20.  The patient feels more comfortable since thoracentesis.  He mentioned they discussed a contralateral thoracentesis, not sure.  Lungs show diminished breath sounds at both bases.  Prolonged expiratory phase.  No rales, no wheeze auscultated. Heart regular rhythm.  No S3, S4.  No heaves, thrills, or rubs.  Plan right now is to continue gentle diuresis.  Monitoring renal function. Optimize hemodynamics, lower blood pressure if possible and decide on dialysis of that is in the near future.     Melvyn Novas, MD     RMD/MEDQ  D:  09/28/2012  T:  09/28/2012  Job:  [id number]

## 2012-09-29 LAB — CBC
HCT: 29.1 % — ABNORMAL LOW (ref 39.0–52.0)
MCH: 28 pg (ref 26.0–34.0)
MCHC: 32.3 g/dL (ref 30.0–36.0)
MCV: 86.6 fL (ref 78.0–100.0)
RDW: 16.5 % — ABNORMAL HIGH (ref 11.5–15.5)

## 2012-09-29 LAB — BASIC METABOLIC PANEL
BUN: 99 mg/dL — ABNORMAL HIGH (ref 6–23)
CO2: 20 mEq/L (ref 19–32)
Chloride: 97 mEq/L (ref 96–112)
Creatinine, Ser: 5.36 mg/dL — ABNORMAL HIGH (ref 0.50–1.35)
Glucose, Bld: 228 mg/dL — ABNORMAL HIGH (ref 70–99)

## 2012-09-29 LAB — HEPATIC FUNCTION PANEL
Alkaline Phosphatase: 66 U/L (ref 39–117)
Bilirubin, Direct: <0.1 mg/dL (ref 0.0–0.3)
Total Bilirubin: 0.2 mg/dL — ABNORMAL LOW (ref 0.3–1.2)

## 2012-09-29 LAB — BODY FLUID CULTURE

## 2012-09-29 MED ORDER — METHYLPREDNISOLONE SODIUM SUCC 125 MG IJ SOLR
80.0000 mg | Freq: Four times a day (QID) | INTRAMUSCULAR | Status: DC
  Administered 2012-09-29 – 2012-09-30 (×5): 80 mg via INTRAVENOUS
  Filled 2012-09-29 (×5): qty 2

## 2012-09-29 NOTE — Progress Notes (Signed)
[name], SHROPSHIRE NO.:  [id number]  MEDICAL RECORD NO.:  [id number]  LOCATION:  A315                          FACILITY:  [facility]  PHYSICIAN:  Melvyn Novas, MDDATE OF BIRTH:  [birth year]  DATE OF PROCEDURE: DATE OF DISCHARGE:                                PROGRESS NOTE   The patient has acute on chronic renal failure.  Creatinine hovering around 5.  Has fistula and not quite ready for dialysis.  He has no clinical uremic symptoms as hypertension.  Intravascular volume overload, bilateral pleural effusions status post thoracentesis on the right, currently controlled on 3 antibiotics, Maxipime, Levaquin, and vancomycin for hospital-acquired infection.  The patient has no dizziness and no significant dyspnea at rest.  Chest x-ray done yesterday shows reduction in size of right pleural effusion, left remains consistent with previous x-rays.  Hemoglobin 8.9, steady.  Blood pressure 159/71, temperature 97.8, pulse 82 and regular, respiratory rate 19, O2 saturation 94%.  Creatinine 5.2.  Lungs show diminished breath sounds at the bases.  Prolonged expiratory phase.  No rales audible.  No rhonchi.  Heart regular rhythm.  No S3, S4.  No heaves, thrills, or rubs.  The patient has normal systolic function.  Plan right now is to await Dr. Juanetta Gosling' decision on whether to perform a thoracentesis on contralateral side.  Monitor hemodynamics.  Continue antihypertensive control as is.  Monitor renal function and continue antibiotics per Dr. Juanetta Gosling.     Melvyn Novas, MD     RMD/MEDQ  D:  09/29/2012  T:  09/29/2012  Job:  [id number]

## 2012-09-29 NOTE — Progress Notes (Signed)
[id number] 

## 2012-09-29 NOTE — Progress Notes (Signed)
Randy [name]  MRN: [id number]  DOB/AGE: April 12, 1932 79 y.o.  Primary Care Physician:Randy M, MD  Admit date: 09/24/2012  Chief Complaint:  Chief Complaint  Patient presents with  . Shortness of Breath    S-Pt presented on  09/24/2012 with  Chief Complaint  Patient presents with  . Shortness of Breath  . Pt offers No new complaints.   meds     . albuterol  2.5 mg Nebulization QID  . amLODipine  10 mg Oral q morning - 10a  . atorvastatin  40 mg Oral q1800  . budesonide-formoterol  2 puff Inhalation BID  . calcitRIOL  0.5 mcg Oral Daily  . ceFEPime (MAXIPIME) IV  1 g Intravenous Q24H  . cloNIDine  0.2 mg Oral BID  . darbepoetin  60 mcg Subcutaneous Q14 Days  . enoxaparin (LOVENOX) injection  30 mg Subcutaneous Q24H  . furosemide  100 mg Intravenous BID  . hydrALAZINE  50 mg Oral TID  . ipratropium  0.5 mg Nebulization QID  . labetalol  400 mg Oral BID  . levofloxacin (LEVAQUIN) IV  500 mg Intravenous Q48H  . methylPREDNISolone (SOLU-MEDROL) injection  80 mg Intravenous Q6H  . multivitamin with minerals  1 tablet Oral Daily  . sodium chloride  3 mL Intravenous Q12H  . vancomycin  1,000 mg Intravenous Q48H      Physical Exam: Vital signs in last 24 hours: Temp:  [97.6 F (36.4 C)-97.8 F (36.6 C)] 97.8 F (36.6 C) (01/07 7829) Pulse Rate:  [73-82] 82  (01/07 0619) Resp:  [19-20] 19  (01/07 0619) BP: (139-159)/(71-73) 159/71 mmHg (01/07 0619) SpO2:  [94 %-99 %] 96 % (01/07 0710) Weight:  [196 lb 13.9 oz (89.3 kg)] 196 lb 13.9 oz (89.3 kg) (01/07 0309) Weight change:  Last BM Date: 09/27/12  Intake/Output from previous day: 01/06 0701 - 01/07 0700 In: 7000.2 [P.O.:1320; I.V.:5680.2] Out: 1175 [Urine:1175]     Physical Exam: General- pt is awake,alert, oriented to time place and person Resp- No acute REsp distress, decreased Bs at bases.wheezing +  CVS- S1S2 regular in rate and rhythm GIT- BS+, soft, NT, ND EXT-trace LE Edema, Cyanosis Access-  left AVF +   Lab Results: CBC  Basename 09/29/12 0451 09/28/12 0518  WBC 7.9 6.6  HGB 9.4* 8.9*  HCT 29.1* 27.2*  PLT 309 295    BMET  Basename 09/29/12 0451 09/28/12 0518  NA 130* 131*  K 4.9 5.2*  CL 97 99  CO2 20 21  GLUCOSE 228* 216*  BUN 99* 91*  CREATININE 5.36* 5.22*  CALCIUM 7.9* 8.1*    Creat trend 2014 5.1--5.3 2013 1.8--4.3 2008 1.8--2.0   MICRO Recent Results (from the past 240 hour(s))  AFB CULTURE WITH SMEAR     Status: Normal (Preliminary result)   Collection Time   09/25/12 12:00 PM      Component Value Range Status Comment   Specimen Description PLEURAL   Final    Special Requests NONE   Final    ACID FAST SMEAR NO ACID FAST BACILLI SEEN   Final    Culture     Final    Value: CULTURE WILL BE EXAMINED FOR 6 WEEKS BEFORE ISSUING A FINAL REPORT   Report Status PENDING   Incomplete   BODY FLUID CULTURE     Status: Normal (Preliminary result)   Collection Time   09/25/12 12:00 PM      Component Value Range Status Comment   Specimen Description PLEURAL  Final    Special Requests NONE   Final    Gram Stain     Final    Value: NO WBC SEEN     NO ORGANISMS SEEN   Culture NO GROWTH 3 DAYS   Final    Report Status PENDING   Incomplete       Lab Results  Component Value Date   PTH 559.0* 06/24/2012   CALCIUM 7.9* 09/29/2012   PHOS 5.2* 09/28/2012    2 d echo done in jan 2014 Study Conclusions  - Left ventricle: The cavity size was normal. Wall thickness was increased in a pattern of mild LVH. Systolic function was normal. The estimated ejection fraction was in the range of 55% to 60% with mild apical hypokinesis. - Pulmonary arteries: PA peak pressure: 57mm Hg (S). - Pericardium, extracardiac: There was a left pleural effusion.  2 d echo done in oct 2013 - Left ventricle: The cavity size was normal. Wall thickness was increased in a pattern of mild LVH. Systolic function was mildly to moderately reduced. The estimated ejection fraction was  in the range of 40% to 45%. There is global hypokinesis, more prominent in the basal inferolateral myocardium. Doppler parameters are consistent with abnormal left ventricular relaxation (grade 1 diastolic dysfunction). - Aortic valve: Mildly to moderately calcified annulus. Trileaflet; mildly calcified leaflets. Mild regurgitation. Mean gradient: 8mm Hg (S). - Mitral valve: Calcified annulus. Mild regurgitation. - Tricuspid valve: Trivial regurgitation. - Pulmonary arteries: PA peak pressure: 31mm Hg (S).    Impression: 1)Renal  AKI secondary to SIRS AKI on CKD NON Oliguric ATN vs CKD progression Creat stable No uremic signs/symptoms at this time CKD stage 4/5 .  CKD since 2008 ( Most likely before that)  CKD secondary to HTN/ Cardiorenal   Progression of CKD marked witgh multilpe ATN     2)HTN Target Organ damage  CKD CHF  Medication-  On Diuretics On Calcium Channel Blockers On Alpha and beta Blockers On Vasodilators On Central Acting Sympatholytics-   3)Anemia HGb at goal (9--11) On EPO  4)CKD Mineral-Bone Disorder PTH elevated  Secondary Hyperparathyroidism  present -on calcitriol Phosphorus at goal.   5)ID- admitted with Hosp Acq Pneumonia On ABx  6)FEN  Normokalemic Hyponatremic from hypervolemia On Lasix   7)Acid base Co2 at goal   8) Access- Placed   Plan:  Will continue current therapy. Will follow bmet       [name] S 09/29/2012, 9:11 AM

## 2012-09-29 NOTE — Progress Notes (Signed)
Inpatient Diabetes Program Recommendations  AACE/ADA: New Consensus Statement on Inpatient Glycemic Control (2013)  Target Ranges:  Prepandial:   less than 140 mg/dL      Peak postprandial:   less than 180 mg/dL (1-2 hours)      Critically ill patients:  140 - 180 mg/dL  Results for JB, DULWORTH (MRN [id number]) as of 09/29/2012 12:42  Ref. Range 09/24/2012 11:19 09/26/2012 06:08 09/27/2012 06:33 09/28/2012 05:18 09/29/2012 04:51  Glucose Latest Range: 70-99 mg/dL 409 (H) 811 (H) 914 (H) 216 (H) 228 (H)    Inpatient Diabetes Program Recommendations Correction (SSI): Please consider ordering CBGs ACHS with Novolog sensitive correction if needed while patient is on steroids. HgbA1C: Please consider ordering an A1C.  The last A1C in the chart was 6.2% on 06-23-2012.  Note: Patient does not have a documented history of diabetes, however, blood glucose has been consistently elevated during admission.  Initial lab glucose was 153 mg/dl and blood glucose has been more elevated each day on labs.  Fasting lab glucose was 228 mg/dl this morning.  Please consider ordering an A1C to determine glycemic control over the last 2-3 months and checking CBGs ACHS with Novolog sensitive correction if needed while patient is on steroids. Will continue to follow.  Thanks, Orlando Penner, RN, BSN, CCRN Diabetes Coordinator Inpatient Diabetes Program [phone]

## 2012-09-30 ENCOUNTER — Inpatient Hospital Stay (HOSPITAL_COMMUNITY): Payer: Medicare Other

## 2012-09-30 LAB — BODY FLUID CELL COUNT WITH DIFFERENTIAL
Eos, Fluid: 1 %
Lymphs, Fluid: 70 %
Neutrophil Count, Fluid: 5 % (ref 0–25)

## 2012-09-30 LAB — HEPATIC FUNCTION PANEL
AST: 14 U/L (ref 0–37)
Albumin: 2.3 g/dL — ABNORMAL LOW (ref 3.5–5.2)
Bilirubin, Direct: <0.1 mg/dL (ref 0.0–0.3)

## 2012-09-30 LAB — GLUCOSE, SEROUS FLUID: Glucose, Fluid: 286 mg/dL

## 2012-09-30 LAB — BASIC METABOLIC PANEL
Calcium: 7.7 mg/dL — ABNORMAL LOW (ref 8.4–10.5)
GFR calc Af Amer: 10 mL/min — ABNORMAL LOW (ref >90)
GFR calc non Af Amer: 9 mL/min — ABNORMAL LOW (ref >90)
Potassium: 4.8 mEq/L (ref 3.5–5.1)
Sodium: 130 mEq/L — ABNORMAL LOW (ref 135–145)

## 2012-09-30 LAB — PROTEIN, BODY FLUID: Total protein, fluid: 1.3 g/dL

## 2012-09-30 LAB — LACTATE DEHYDROGENASE, PLEURAL OR PERITONEAL FLUID: LD, Fluid: 56 U/L — ABNORMAL HIGH (ref 3–23)

## 2012-09-30 LAB — VANCOMYCIN, TROUGH: Vancomycin Tr: 18 ug/mL (ref 10.0–20.0)

## 2012-09-30 MED ORDER — PREDNISONE 20 MG PO TABS
20.0000 mg | ORAL_TABLET | Freq: Two times a day (BID) | ORAL | Status: DC
  Administered 2012-09-30 – 2012-10-01 (×2): 20 mg via ORAL
  Filled 2012-09-30 (×2): qty 1

## 2012-09-30 NOTE — Progress Notes (Signed)
ANTIBIOTIC CONSULT NOTE   Pharmacy Consult for Vancomycin, Cefepime, Levaquin Indication: pneumonia  No Known Allergies  Patient Measurements: Height: 5\' 9"  (175.3 cm) Weight: 201 lb 4.5 oz (91.3 kg) IBW/kg (Calculated) : 70.7   Vital Signs: Temp: 98 F (36.7 C) (01/08 0537) Temp src: Oral (01/08 0537) BP: 115/57 mmHg (01/08 1259) Pulse Rate: 82  (01/08 0537) Intake/Output from previous day: 01/07 0701 - 01/08 0700 In: 600 [P.O.:600] Out: 725 [Urine:725] Intake/Output from this shift: Total I/O In: 240 [P.O.:240] Out: -   Labs:  Basename 09/30/12 0436 09/29/12 0451 09/28/12 0518  WBC -- 7.9 6.6  HGB -- 9.4* 8.9*  PLT -- 309 295  LABCREA -- -- --  CREATININE 5.56* 5.36* 5.22*   Estimated Creatinine Clearance: 12.2 ml/min (by C-G formula based on Cr of 5.56).  Basename 09/30/12 1127  VANCOTROUGH 18.0  VANCOPEAK --  VANCORANDOM --  GENTTROUGH --  GENTPEAK --  GENTRANDOM --  TOBRATROUGH --  TOBRAPEAK --  TOBRARND --  AMIKACINPEAK --  AMIKACINTROU --  AMIKACIN --    Microbiology: Recent Results (from the past 720 hour(s))  AFB CULTURE WITH SMEAR     Status: Normal (Preliminary result)   Collection Time   09/25/12 12:00 PM      Component Value Range Status Comment   Specimen Description PLEURAL   Final    Special Requests NONE   Final    ACID FAST SMEAR NO ACID FAST BACILLI SEEN   Final    Culture     Final    Value: CULTURE WILL BE EXAMINED FOR 6 WEEKS BEFORE ISSUING A FINAL REPORT   Report Status PENDING   Incomplete   BODY FLUID CULTURE     Status: Normal   Collection Time   09/25/12 12:00 PM      Component Value Range Status Comment   Specimen Description FLUID PLEURAL   Final    Special Requests NONE   Final    Gram Stain     Final    Value: NO WBC SEEN     NO ORGANISMS SEEN   Culture NO GROWTH 3 DAYS   Final    Report Status 09/29/2012 FINAL   Final    Medical History: Past Medical History  Diagnosis Date  . Hypertensive heart disease   .  Hypercholesterolemia   . Gout   . Secondary cardiomyopathy     LVEF 40-45%  . Anemia of chronic disease   . Hypertension   . CHF (congestive heart failure)   . Shortness of breath   . Arthritis   . CKD (chronic kidney disease) stage 4, GFR 15-29 ml/min    Medications:  Scheduled:     . albuterol  2.5 mg Nebulization QID  . amLODipine  10 mg Oral q morning - 10a  . atorvastatin  40 mg Oral q1800  . budesonide-formoterol  2 puff Inhalation BID  . calcitRIOL  0.5 mcg Oral Daily  . ceFEPime (MAXIPIME) IV  1 g Intravenous Q24H  . cloNIDine  0.2 mg Oral BID  . darbepoetin  60 mcg Subcutaneous Q14 Days  . enoxaparin (LOVENOX) injection  30 mg Subcutaneous Q24H  . furosemide  100 mg Intravenous BID  . hydrALAZINE  50 mg Oral TID  . ipratropium  0.5 mg Nebulization QID  . labetalol  400 mg Oral BID  . levofloxacin (LEVAQUIN) IV  500 mg Intravenous Q48H  . multivitamin with minerals  1 tablet Oral Daily  . predniSONE  20  mg Oral BID WC  . sodium chloride  3 mL Intravenous Q12H  . vancomycin  1,000 mg Intravenous Q48H  . [DISCONTINUED] methylPREDNISolone (SOLU-MEDROL) injection  80 mg Intravenous Q6H   Assessment: 79 yo M admit with possible PNA.  He was admitted & treated for the same ~1 month ago.  Starting broad-spectrum antibiotics to coverage health-care acquired pathogens.  He has Stage 4 CKD, Scr is rising.  Nephrology is following.  Trough level is on target.   Goal of Therapy:  Vancomycin trough level 15-20 mcg/ml  Plan:  1) Cefepime 2gm IV x1 then 1gm IV q24h 2) Levaquin 750mg  IV x1 dose then 500mg  IV Q48h 3) Vancomycin 1500mg  IV x1 then 1gm IV q48h 4) Check Vancomycin trough weekly. 5) Monitor renal function and cx data   Wayland Denis 09/30/2012,1:02 PM

## 2012-09-30 NOTE — Progress Notes (Signed)
Lidocaine 1%        1.13mL injected                        pleural fluid removed

## 2012-09-30 NOTE — Progress Notes (Signed)
Subjective: He says he feels fairly well. He has no new complaints. He is still short of breath. He wants to know if he needs another thoracentesis.  Objective: Vital signs in last 24 hours: Temp:  [97.9 F (36.6 C)-98 F (36.7 C)] 98 F (36.7 C) (01/08 0537) Pulse Rate:  [77-85] 82  (01/08 0537) Resp:  [18-20] 18  (01/08 0537) BP: (143-150)/(70-73) 143/70 mmHg (01/08 0537) SpO2:  [97 %-99 %] 98 % (01/08 0706) Weight:  [91.3 kg (201 lb 4.5 oz)] 91.3 kg (201 lb 4.5 oz) (01/08 0537) Weight change: 2 kg (4 lb 6.6 oz) Last BM Date: 09/27/12  Intake/Output from previous day: 01/07 0701 - 01/08 0700 In: 600 [P.O.:600] Out: 725 [Urine:725]  PHYSICAL EXAM General appearance: alert, cooperative and no distress Resp: rhonchi bilaterally Cardio: regular rate and rhythm, S1, S2 normal, no murmur, click, rub or gallop GI: soft, non-tender; bowel sounds normal; no masses,  no organomegaly Extremities: Trace edema  Lab Results:    Basic Metabolic Panel:  Basename 09/30/12 0436 09/29/12 0451 09/28/12 0722 09/28/12 0518  NA 130* 130* -- --  K 4.8 4.9 -- --  CL 99 97 -- --  CO2 19 20 -- --  GLUCOSE 231* 228* -- --  BUN 111* 99* -- --  CREATININE 5.56* 5.36* -- --  CALCIUM 7.7* 7.9* -- --  MG -- -- 1.9 --  PHOS -- -- -- 5.2*   Liver Function Tests:  [facility] 09/30/12 0436 09/29/12 0451  AST 14 19  ALT 28 35  ALKPHOS 69 66  BILITOT 0.2* 0.2*  PROT 5.3* 5.8*  ALBUMIN 2.3* 2.5*   No results found for this basename: LIPASE:2,AMYLASE:2 in the last 72 hours No results found for this basename: AMMONIA:2 in the last 72 hours CBC:  Basename 09/29/12 0451 09/28/12 0518  WBC 7.9 6.6  NEUTROABS -- --  HGB 9.4* 8.9*  HCT 29.1* 27.2*  MCV 86.6 85.5  PLT 309 295   Cardiac Enzymes: No results found for this basename: CKTOTAL:3,CKMB:3,CKMBINDEX:3,TROPONINI:3 in the last 72 hours BNP: No results found for this basename: PROBNP:3 in the last 72 hours D-Dimer: No results found for  this basename: DDIMER:2 in the last 72 hours CBG: No results found for this basename: GLUCAP:6 in the last 72 hours Hemoglobin A1C: No results found for this basename: HGBA1C in the last 72 hours Fasting Lipid Panel: No results found for this basename: CHOL,HDL,LDLCALC,TRIG,CHOLHDL,LDLDIRECT in the last 72 hours Thyroid Function Tests: No results found for this basename: TSH,T4TOTAL,FREET4,T3FREE,THYROIDAB in the last 72 hours Anemia Panel: No results found for this basename: VITAMINB12,FOLATE,FERRITIN,TIBC,IRON,RETICCTPCT in the last 72 hours Coagulation: No results found for this basename: LABPROT:2,INR:2 in the last 72 hours Urine Drug Screen: Drugs of Abuse  No results found for this basename: labopia, cocainscrnur, labbenz, amphetmu, thcu, labbarb    Alcohol Level: No results found for this basename: ETH:2 in the last 72 hours Urinalysis: No results found for this basename: COLORURINE:2,APPERANCEUR:2,LABSPEC:2,PHURINE:2,GLUCOSEU:2,HGBUR:2,BILIRUBINUR:2,KETONESUR:2,PROTEINUR:2,UROBILINOGEN:2,NITRITE:2,LEUKOCYTESUR:2 in the last 72 hours Misc. Labs:  ABGS No results found for this basename: PHART,PCO2,PO2ART,TCO2,HCO3 in the last 72 hours CULTURES Recent Results (from the past 240 hour(s))  AFB CULTURE WITH SMEAR     Status: Normal (Preliminary result)   Collection Time   09/25/12 12:00 PM      Component Value Range Status Comment   Specimen Description PLEURAL   Final    Special Requests NONE   Final    ACID FAST SMEAR NO ACID FAST BACILLI SEEN   Final  Culture     Final    Value: CULTURE WILL BE EXAMINED FOR 6 WEEKS BEFORE ISSUING A FINAL REPORT   Report Status PENDING   Incomplete   BODY FLUID CULTURE     Status: Normal   Collection Time   09/25/12 12:00 PM      Component Value Range Status Comment   Specimen Description FLUID PLEURAL   Final    Special Requests NONE   Final    Gram Stain     Final    Value: NO WBC SEEN     NO ORGANISMS SEEN   Culture NO GROWTH 3  DAYS   Final    Report Status 09/29/2012 FINAL   Final    Studies/Results: Dg Chest 2 View  09/28/2012  *RADIOLOGY REPORT*  Clinical Data: Follow up of pneumonia/pleural effusion.  CHEST - 2 VIEW  Comparison: 09/25/2012  Findings: Lateral view degraded by patient arm position.  Midline trachea.  Mild cardiomegaly.  Moderate left-sided pleural effusion is similar.  Trace right pleural fluid is likely similar. No pneumothorax.  Improved right base aeration.  Left base air space disease persists.  IMPRESSION:  1. No significant change since the prior exam. 2.  Moderate left pleural effusion with adjacent airspace disease. 3.  Similar trace right pleural fluid.   Original Report Authenticated By: Jeronimo Greaves, M.D.     Medications:  Scheduled:   . albuterol  2.5 mg Nebulization QID  . amLODipine  10 mg Oral q morning - 10a  . atorvastatin  40 mg Oral q1800  . budesonide-formoterol  2 puff Inhalation BID  . calcitRIOL  0.5 mcg Oral Daily  . ceFEPime (MAXIPIME) IV  1 g Intravenous Q24H  . cloNIDine  0.2 mg Oral BID  . darbepoetin  60 mcg Subcutaneous Q14 Days  . enoxaparin (LOVENOX) injection  30 mg Subcutaneous Q24H  . furosemide  100 mg Intravenous BID  . hydrALAZINE  50 mg Oral TID  . ipratropium  0.5 mg Nebulization QID  . labetalol  400 mg Oral BID  . levofloxacin (LEVAQUIN) IV  500 mg Intravenous Q48H  . methylPREDNISolone (SOLU-MEDROL) injection  80 mg Intravenous Q6H  . multivitamin with minerals  1 tablet Oral Daily  . sodium chloride  3 mL Intravenous Q12H  . vancomycin  1,000 mg Intravenous Q48H   Continuous:   . sodium chloride 100 mL/hr at 09/30/12 0203   ZOX:WRUEAV chloride, acetaminophen, acetaminophen, albuterol, bisacodyl, HYDROcodone-homatropine, ondansetron (ZOFRAN) IV, ondansetron, oxyCODONE, sodium chloride, traZODone  Assesment: He has a pneumonia and pleural effusions. He does have pretty severe COPD. He has had a thoracentesis with good results but may need another  on the other side. He has chronic renal failure Active Problems:  * No active hospital problems. *     Plan: I will have him get a two-view chest x-ray and see if he needs thoracentesis or not    LOS: 6 days   Alesi Zachery L 09/30/2012, 7:44 AM

## 2012-09-30 NOTE — Procedures (Signed)
Clinical Data:  [Left-sided pleural fluid.] ULTRASOUND GUIDED {left} THORACENTESIS Comparison:  [Plain film chest of 09/28/2012] An ultrasound guided thoracentesis was thoroughly discussed with the patient and questions answered.  The benefits, risks, alternatives and complications were also discussed.  The patient understands and wishes to proceed with the procedure.  Written consent was obtained. Ultrasound was performed to localize and mark an adequate pocket of fluid in the {left} chest.  The area was then prepped and draped in the normal sterile fashion.  1% Lidocaine was used for local anesthesia.  Under ultrasound guidance a 19 gauge Yueh catheter was introduced.  Thoracentesis was performed.  The catheter was removed and a dressing applied. Complications:  [None] Findings: A total of approximately [1030 cc] of [clear/serous] fluid was removed. A fluid sample {was } sent for laboratory analysis. IMPRESSION: Successful ultrasound guided {left} thoracentesis yielding [1030] of pleural fluid.

## 2012-09-30 NOTE — Progress Notes (Signed)
Randy [name]  MRN: [id number]  DOB/AGE: 1931/12/15 79 y.o.  Primary Care Physician:DONDIEGO,RICHARD M, MD  Admit date: 09/24/2012  Chief Complaint:  Chief Complaint  Patient presents with  . Shortness of Breath    S-Pt presented on  09/24/2012 with  Chief Complaint  Patient presents with  . Shortness of Breath  . Pt offers No new complaints. Pt awaiting thoracentesis.   meds     . albuterol  2.5 mg Nebulization QID  . amLODipine  10 mg Oral q morning - 10a  . atorvastatin  40 mg Oral q1800  . budesonide-formoterol  2 puff Inhalation BID  . calcitRIOL  0.5 mcg Oral Daily  . ceFEPime (MAXIPIME) IV  1 g Intravenous Q24H  . cloNIDine  0.2 mg Oral BID  . darbepoetin  60 mcg Subcutaneous Q14 Days  . enoxaparin (LOVENOX) injection  30 mg Subcutaneous Q24H  . furosemide  100 mg Intravenous BID  . hydrALAZINE  50 mg Oral TID  . ipratropium  0.5 mg Nebulization QID  . labetalol  400 mg Oral BID  . levofloxacin (LEVAQUIN) IV  500 mg Intravenous Q48H  . methylPREDNISolone (SOLU-MEDROL) injection  80 mg Intravenous Q6H  . multivitamin with minerals  1 tablet Oral Daily  . sodium chloride  3 mL Intravenous Q12H  . vancomycin  1,000 mg Intravenous Q48H      Physical Exam: Vital signs in last 24 hours: Temp:  [97.9 F (36.6 C)-98 F (36.7 C)] 98 F (36.7 C) (01/08 0537) Pulse Rate:  [77-85] 82  (01/08 0537) Resp:  [18-20] 18  (01/08 0537) BP: (143-150)/(70-73) 143/70 mmHg (01/08 0537) SpO2:  [97 %-99 %] 98 % (01/08 0706) Weight:  [201 lb 4.5 oz (91.3 kg)] 201 lb 4.5 oz (91.3 kg) (01/08 0537) Weight change: 4 lb 6.6 oz (2 kg) Last BM Date: 09/27/12  Intake/Output from previous day: 01/07 0701 - 01/08 0700 In: 600 [P.O.:600] Out: 725 [Urine:725]     Physical Exam: General- pt is awake,alert, oriented to time place and person Resp- No acute REsp distress, decreased Bs at bases. CVS- S1S2 regular in rate and rhythm GIT- BS+, soft, NT, ND EXT-trace LE Edema,  Cyanosis Access- left AVF +   Lab Results: CBC  Basename 09/29/12 0451 09/28/12 0518  WBC 7.9 6.6  HGB 9.4* 8.9*  HCT 29.1* 27.2*  PLT 309 295    BMET  Basename 09/30/12 0436 09/29/12 0451  NA 130* 130*  K 4.8 4.9  CL 99 97  CO2 19 20  GLUCOSE 231* 228*  BUN 111* 99*  CREATININE 5.56* 5.36*  CALCIUM 7.7* 7.9*    Creat trend 2014 5.1--5.3==>5.5 2013 1.8--4.3 2008 1.8--2.0   MICRO Recent Results (from the past 240 hour(s))  AFB CULTURE WITH SMEAR     Status: Normal (Preliminary result)   Collection Time   09/25/12 12:00 PM      Component Value Range Status Comment   Specimen Description PLEURAL   Final    Special Requests NONE   Final    ACID FAST SMEAR NO ACID FAST BACILLI SEEN   Final    Culture     Final    Value: CULTURE WILL BE EXAMINED FOR 6 WEEKS BEFORE ISSUING A FINAL REPORT   Report Status PENDING   Incomplete   BODY FLUID CULTURE     Status: Normal   Collection Time   09/25/12 12:00 PM      Component Value Range Status Comment   Specimen Description  FLUID PLEURAL   Final    Special Requests NONE   Final    Gram Stain     Final    Value: NO WBC SEEN     NO ORGANISMS SEEN   Culture NO GROWTH 3 DAYS   Final    Report Status 09/29/2012 FINAL   Final       Lab Results  Component Value Date   PTH 559.0* 06/24/2012   CALCIUM 7.7* 09/30/2012   PHOS 5.2* 09/28/2012    2 d echo done in jan 2014 Study Conclusions  - Left ventricle: The cavity size was normal. Wall thickness was increased in a pattern of mild LVH. Systolic function was normal. The estimated ejection fraction was in the range of 55% to 60% with mild apical hypokinesis. - Pulmonary arteries: PA peak pressure: 57mm Hg (S). - Pericardium, extracardiac: There was a left pleural effusion.  2 d echo done in oct 2013 - Left ventricle: The cavity size was normal. Wall thickness was increased in a pattern of mild LVH. Systolic function was mildly to moderately reduced. The estimated  ejection fraction was in the range of 40% to 45%. There is global hypokinesis, more prominent in the basal inferolateral myocardium. Doppler parameters are consistent with abnormal left ventricular relaxation (grade 1 diastolic dysfunction). - Aortic valve: Mildly to moderately calcified annulus. Trileaflet; mildly calcified leaflets. Mild regurgitation. Mean gradient: 8mm Hg (S). - Mitral valve: Calcified annulus. Mild regurgitation. - Tricuspid valve: Trivial regurgitation. - Pulmonary arteries: PA peak pressure: 31mm Hg (S).    Impression: 1)Renal  AKI secondary to SIRS AKI on CKD NON Oliguric ATN vs CKD progression Creat minimally high. No uremic signs/symptoms at this time CKD stage 5 . CKD since 2008 ( Most likely before that) CKD secondary to HTN/ Cardiorenal  Progression of CKD marked witgh multilpe ATN     2)HTN Target Organ damage  CKD CHF  Medication-  On Diuretics On Calcium Channel Blockers On Alpha and beta Blockers On Vasodilators On Central Acting Sympatholytics-   3)Anemia HGb at goal (9--11) On EPO  4)CKD Mineral-Bone Disorder PTH elevated  Secondary Hyperparathyroidism  present -on calcitriol Phosphorus at goal.   5)ID- admitted with Hosp Acq Pneumonia On ABx  6)FEN  Normokalemic Hyponatremic from hypervolemia On Lasix Na stable at 130   7)Acid base Co2 at goal   8) Access- Placed   Plan:  Will continue current therapy. Will follow closely for need of HD -no need yet  Will follow bmet       [name] S 09/30/2012, 10:15 AM

## 2012-09-30 NOTE — Progress Notes (Signed)
[id number] 

## 2012-09-30 NOTE — Progress Notes (Signed)
Subjective: He is awake and alert. He says he feels fairly well. He has no new complaints. His chest x-ray does show a relatively large pleural effusion on the left  Objective: Vital signs in last 24 hours: Temp:  [97.9 F (36.6 C)-98 F (36.7 C)] 98 F (36.7 C) (01/08 0537) Pulse Rate:  [77-85] 82  (01/08 0537) Resp:  [18-20] 18  (01/08 0537) BP: (143-150)/(70-73) 143/70 mmHg (01/08 0537) SpO2:  [97 %-99 %] 98 % (01/08 0706) Weight:  [91.3 kg (201 lb 4.5 oz)] 91.3 kg (201 lb 4.5 oz) (01/08 0537) Weight change: 2 kg (4 lb 6.6 oz) Last BM Date: 09/27/12  Intake/Output from previous day: 01/07 0701 - 01/08 0700 In: 600 [P.O.:600] Out: 725 [Urine:725]  PHYSICAL EXAM General appearance: alert, cooperative and no distress Resp: rhonchi bilaterally Cardio: regular rate and rhythm, S1, S2 normal, no murmur, click, rub or gallop GI: soft, non-tender; bowel sounds normal; no masses,  no organomegaly Extremities: extremities normal, atraumatic, no cyanosis or edema  Lab Results:    Basic Metabolic Panel:  Basename 09/30/12 0436 09/29/12 0451 09/28/12 0722 09/28/12 0518  NA 130* 130* -- --  K 4.8 4.9 -- --  CL 99 97 -- --  CO2 19 20 -- --  GLUCOSE 231* 228* -- --  BUN 111* 99* -- --  CREATININE 5.56* 5.36* -- --  CALCIUM 7.7* 7.9* -- --  MG -- -- 1.9 --  PHOS -- -- -- 5.2*   Liver Function Tests:  [facility] 09/30/12 0436 09/29/12 0451  AST 14 19  ALT 28 35  ALKPHOS 69 66  BILITOT 0.2* 0.2*  PROT 5.3* 5.8*  ALBUMIN 2.3* 2.5*   No results found for this basename: LIPASE:2,AMYLASE:2 in the last 72 hours No results found for this basename: AMMONIA:2 in the last 72 hours CBC:  Basename 09/29/12 0451 09/28/12 0518  WBC 7.9 6.6  NEUTROABS -- --  HGB 9.4* 8.9*  HCT 29.1* 27.2*  MCV 86.6 85.5  PLT 309 295   Cardiac Enzymes: No results found for this basename: CKTOTAL:3,CKMB:3,CKMBINDEX:3,TROPONINI:3 in the last 72 hours BNP: No results found for this basename:  PROBNP:3 in the last 72 hours D-Dimer: No results found for this basename: DDIMER:2 in the last 72 hours CBG: No results found for this basename: GLUCAP:6 in the last 72 hours Hemoglobin A1C: No results found for this basename: HGBA1C in the last 72 hours Fasting Lipid Panel: No results found for this basename: CHOL,HDL,LDLCALC,TRIG,CHOLHDL,LDLDIRECT in the last 72 hours Thyroid Function Tests: No results found for this basename: TSH,T4TOTAL,FREET4,T3FREE,THYROIDAB in the last 72 hours Anemia Panel: No results found for this basename: VITAMINB12,FOLATE,FERRITIN,TIBC,IRON,RETICCTPCT in the last 72 hours Coagulation: No results found for this basename: LABPROT:2,INR:2 in the last 72 hours Urine Drug Screen: Drugs of Abuse  No results found for this basename: labopia, cocainscrnur, labbenz, amphetmu, thcu, labbarb    Alcohol Level: No results found for this basename: ETH:2 in the last 72 hours Urinalysis: No results found for this basename: COLORURINE:2,APPERANCEUR:2,LABSPEC:2,PHURINE:2,GLUCOSEU:2,HGBUR:2,BILIRUBINUR:2,KETONESUR:2,PROTEINUR:2,UROBILINOGEN:2,NITRITE:2,LEUKOCYTESUR:2 in the last 72 hours Misc. Labs:  ABGS No results found for this basename: PHART,PCO2,PO2ART,TCO2,HCO3 in the last 72 hours CULTURES Recent Results (from the past 240 hour(s))  AFB CULTURE WITH SMEAR     Status: Normal (Preliminary result)   Collection Time   09/25/12 12:00 PM      Component Value Range Status Comment   Specimen Description PLEURAL   Final    Special Requests NONE   Final    ACID FAST SMEAR NO  ACID FAST BACILLI SEEN   Final    Culture     Final    Value: CULTURE WILL BE EXAMINED FOR 6 WEEKS BEFORE ISSUING A FINAL REPORT   Report Status PENDING   Incomplete   BODY FLUID CULTURE     Status: Normal   Collection Time   09/25/12 12:00 PM      Component Value Range Status Comment   Specimen Description FLUID PLEURAL   Final    Special Requests NONE   Final    Gram Stain     Final    Value:  NO WBC SEEN     NO ORGANISMS SEEN   Culture NO GROWTH 3 DAYS   Final    Report Status 09/29/2012 FINAL   Final    Studies/Results: Dg Chest 2 View  09/28/2012  *RADIOLOGY REPORT*  Clinical Data: Follow up of pneumonia/pleural effusion.  CHEST - 2 VIEW  Comparison: 09/25/2012  Findings: Lateral view degraded by patient arm position.  Midline trachea.  Mild cardiomegaly.  Moderate left-sided pleural effusion is similar.  Trace right pleural fluid is likely similar. No pneumothorax.  Improved right base aeration.  Left base air space disease persists.  IMPRESSION:  1. No significant change since the prior exam. 2.  Moderate left pleural effusion with adjacent airspace disease. 3.  Similar trace right pleural fluid.   Original Report Authenticated By: Jeronimo Greaves, M.D.     Medications:  Scheduled:   . albuterol  2.5 mg Nebulization QID  . amLODipine  10 mg Oral q morning - 10a  . atorvastatin  40 mg Oral q1800  . budesonide-formoterol  2 puff Inhalation BID  . calcitRIOL  0.5 mcg Oral Daily  . ceFEPime (MAXIPIME) IV  1 g Intravenous Q24H  . cloNIDine  0.2 mg Oral BID  . darbepoetin  60 mcg Subcutaneous Q14 Days  . enoxaparin (LOVENOX) injection  30 mg Subcutaneous Q24H  . furosemide  100 mg Intravenous BID  . hydrALAZINE  50 mg Oral TID  . ipratropium  0.5 mg Nebulization QID  . labetalol  400 mg Oral BID  . levofloxacin (LEVAQUIN) IV  500 mg Intravenous Q48H  . methylPREDNISolone (SOLU-MEDROL) injection  80 mg Intravenous Q6H  . multivitamin with minerals  1 tablet Oral Daily  . sodium chloride  3 mL Intravenous Q12H  . vancomycin  1,000 mg Intravenous Q48H   Continuous:   . sodium chloride 100 mL/hr at 09/30/12 0203   ZOX:WRUEAV chloride, acetaminophen, acetaminophen, albuterol, bisacodyl, HYDROcodone-homatropine, ondansetron (ZOFRAN) IV, ondansetron, oxyCODONE, sodium chloride, traZODone  Assesment: He does have a pleural effusion I think he's going to need another thoracentesis  on had requested that. Otherwise I think is pretty stable. He continues to improve and is much better as far as his breathing is concerned his renal function is essentially unchanged Active Problems:  * No active hospital problems. *     Plan: For thoracentesis    LOS: 6 days   Nafeesah Lapaglia L 09/30/2012, 7:49 AM

## 2012-10-01 LAB — BASIC METABOLIC PANEL
BUN: 115 mg/dL — ABNORMAL HIGH (ref 6–23)
CO2: 16 mEq/L — ABNORMAL LOW (ref 19–32)
Calcium: 7.9 mg/dL — ABNORMAL LOW (ref 8.4–10.5)
Chloride: 100 mEq/L (ref 96–112)
Creatinine, Ser: 5.4 mg/dL — ABNORMAL HIGH (ref 0.50–1.35)
GFR calc Af Amer: 11 mL/min — ABNORMAL LOW (ref >90)
GFR calc non Af Amer: 9 mL/min — ABNORMAL LOW (ref >90)
Glucose, Bld: 202 mg/dL — ABNORMAL HIGH (ref 70–99)
Potassium: 4.8 mEq/L (ref 3.5–5.1)
Sodium: 132 mEq/L — ABNORMAL LOW (ref 135–145)

## 2012-10-01 LAB — HEPATIC FUNCTION PANEL
ALT: 24 U/L (ref 0–53)
AST: 14 U/L (ref 0–37)
Albumin: 2.1 g/dL — ABNORMAL LOW (ref 3.5–5.2)
Alkaline Phosphatase: 72 U/L (ref 39–117)
Bilirubin, Direct: <0.1 mg/dL (ref 0.0–0.3)
Total Bilirubin: 0.2 mg/dL — ABNORMAL LOW (ref 0.3–1.2)

## 2012-10-01 LAB — ALT: ALT: 26 U/L (ref 0–53)

## 2012-10-01 MED ORDER — LEVOFLOXACIN 500 MG PO TABS
500.0000 mg | ORAL_TABLET | ORAL | Status: DC
  Administered 2012-10-01: 500 mg via ORAL
  Filled 2012-10-01: qty 1

## 2012-10-01 MED ORDER — TUBERCULIN PPD 5 UNIT/0.1ML ID SOLN
5.0000 [IU] | Freq: Once | INTRADERMAL | Status: AC
  Administered 2012-10-01: 5 [IU] via INTRADERMAL
  Filled 2012-10-01: qty 0.1

## 2012-10-01 MED ORDER — LEVOFLOXACIN 500 MG PO TABS: 500.0000 mg | ORAL_TABLET | Freq: Every day | ORAL | Status: AC

## 2012-10-01 NOTE — Progress Notes (Signed)
NAME:  Randy [name], Randy [name] NO.:  [id number]  MEDICAL RECORD NO.:  [id number]  LOCATION:  A315                          FACILITY:  [facility]  PHYSICIAN:  Melvyn Novas, MDDATE OF BIRTH:  1932-02-23  DATE OF PROCEDURE: DATE OF DISCHARGE:                                PROGRESS NOTE   HISTORY OF PRESENT ILLNESS:  The patient is a 79 year old black male with history of accelerated hypertension, chronic renal failure, partially acute on chronic renal failure, hypertension, hyperlipidemia, volume overload, who comes in complaining of shortness of breath, was placed on appropriate antibiotics, was seen in consultation by Pulmonology, as well as Nephrology.  Nephrology felt that he had no uremic symptoms.  His volume overload was not necessarily related to the renal impairment.  He was in a nonoliguric phase, responded to some diuresis.  He had bilateral pleural effusions.  He had a right thoracentesis performed and had less respiratory embarrassment.  Today, we are awaiting left thoracentesis for moderate size left pleural effusion.  He is hemodynamically stable at present.  OBJECTIVE:  Blood pressure 143/70, temperature 98, pulse is 82, respiratory rate is 18, O2 saturation 98%.  Creatinine 5.5, potassium 4.8.  PLAN:  The plan right now is to discontinue the IV Solu-Medrol 80 mg, switch to prednisone 20 p.o. b.i.d., await thoracentesis and consider discharging in a.m. after a night of vigilance after his procedure if okay with Pulmonology.     Melvyn Novas, MD     RMD/MEDQ  D:  09/30/2012  T:  10/01/2012  Job:  [id number]

## 2012-10-01 NOTE — Progress Notes (Signed)
Patient discharged home.  Follow up appointments with Nephrology in place.  IV removed - WNL.  Instructed on new medications.  Patient has no questions-verbalizes understanding.  Stable to discharge

## 2012-10-01 NOTE — Progress Notes (Signed)
Inpatient Diabetes Program Recommendations  AACE/ADA: New Consensus Statement on Inpatient Glycemic Control (2013)  Target Ranges:  Prepandial:   less than 140 mg/dL      Peak postprandial:   less than 180 mg/dL (1-2 hours)      Critically ill patients:  140 - 180 mg/dL    Note: Patient is likely to be discharged today.  Patient's initial lab glucose was 153 mg/dl on 09/28/08 and blood glucose has been elevated on daily labs since admission.  Patient was on Solumedrol and was transitioned to Prednisone on 09/30/12. Please be aware that patient needs A1C checked to determine if he has diabetes.  Fasting lab glucose this am was 202 mg/dl.  Called to ask that nurse Aundra Millet, RN) be sure that Dr. Janna Arch was aware of elevated glucose.    Thanks, Orlando Penner, RN, BSN, CCRN Diabetes Coordinator Inpatient Diabetes Program [phone]

## 2012-10-01 NOTE — Progress Notes (Signed)
[id number] 

## 2012-10-01 NOTE — Progress Notes (Signed)
Patient administered TB test in R [facility].  Circled with black pen.  Patient to be discharged, will have patient get test red Monday in PCPs office per Dr. Kristian Covey.

## 2012-10-01 NOTE — Progress Notes (Signed)
Subjective: Interval History: has no complaint of nausea or vomiting. His appetite is good and difficulty increasing has improved..  Objective: Vital signs in last 24 hours: Temp:  [98.3 F (36.8 C)] 98.3 F (36.8 C) (01/09 0603) Pulse Rate:  [76-78] 78  (01/09 0603) Resp:  [20] 20  (01/09 0603) BP: (115-135)/(57-72) 135/65 mmHg (01/09 0603) SpO2:  [97 %-99 %] 97 % (01/09 0701) FiO2 (%):  [28 %] 28 % (01/08 2004) Weight:  [92.4 kg (203 lb 11.3 oz)] 92.4 kg (203 lb 11.3 oz) (01/09 0603) Weight change: 1.1 kg (2 lb 6.8 oz)  Intake/Output from previous day: 01/08 0701 - 01/09 0700 In: 4388.3 [P.O.:720; I.V.:3618.3; IV Piggyback:50] Out: 1650 [Urine:1650] Intake/Output this shift:    General appearance: alert, cooperative and no distress Resp: clear to auscultation bilaterally Cardio: regular rate and rhythm, S1, S2 normal, no murmur, click, rub or gallop GI: soft, non-tender; bowel sounds normal; no masses,  no organomegaly Extremities: extremities normal, atraumatic, no cyanosis or edema  Lab Results:  [facility] 09/29/12 0451  WBC 7.9  HGB 9.4*  HCT 29.1*  PLT 309   BMET:  Basename 10/01/12 0435 09/30/12 0436  NA 132* 130*  K 4.8 4.8  CL 100 99  CO2 16* 19  GLUCOSE 202* 231*  BUN 115* 111*  CREATININE 5.40* 5.56*  CALCIUM 7.9* 7.7*   No results found for this basename: PTH:2 in the last 72 hours Iron Studies: No results found for this basename: IRON,TIBC,TRANSFERRIN,FERRITIN in the last 72 hours  Studies/Results: Dg Chest 2 View  09/30/2012  *RADIOLOGY REPORT*  Clinical Data: Left pleural effusion.  Status post thoracentesis.  CHEST - 2 VIEW  Comparison: 09/28/2012  Findings: There has been a significant decrease in the left pleural effusion.  No pneumothorax.  Moderate left effusion does remain. There is a small right pleural effusion which is unchanged.  Chronic cardiomegaly.  Pulmonary vascularity is within normal limits.  No osseous abnormality.  IMPRESSION:  Significant decrease in left pleural effusion.  No pneumothorax.   Original Report Authenticated By: Francene Boyers, M.D.    US Thoracentesis Asp Pleural Space W/img Guide  09/30/2012  *RADIOLOGY REPORT*  Clinical Data:  Left-sided pleural fluid.  ULTRASOUND GUIDED left THORACENTESIS  Comparison:  Plain film chest of 09/28/2012  An ultrasound guided thoracentesis was thoroughly discussed with the patient and questions answered.  The benefits, risks, alternatives and complications were also discussed.  The patient understands and wishes to proceed with the procedure.  Written consent was obtained.  Ultrasound was performed to localize and mark an adequate pocket of fluid in the left chest.  The area was then prepped and draped in the normal sterile fashion.  1% Lidocaine was used for local anesthesia.  Under ultrasound guidance a 19 gauge Yueh catheter was introduced.  Thoracentesis was performed.  The catheter was removed and a dressing applied.  Complications:  None  Findings: A total of approximately 1030 cc of clear/serous fluid was removed. A fluid sample was  sent for laboratory analysis.  IMPRESSION: Successful ultrasound guided left thoracentesis yielding 1030 of pleural fluid.   Original Report Authenticated By: Jeronimo Greaves, M.D.     I have reviewed the patient's current medications.  Assessment/Plan: Problem #1 chronic renal failure his BUN is slightly high but creatinine is stable. Presently patient does not have any uremic sinus symptoms. He has a fistula which is about 5 weeks. His potassium is well controlled and as this moment will hold dialysis. Problem #2 pleural  effusion bilateral he status post a thoracentesis presently patient feels better. And he denies any difficulty breathing, no orthopnea or paroxysmal nocturnal dyspnea.  Problem #3 anemia is secondary to chronic renal failure presently patient is on erythropoietin and has received blood transfusion H&H is stable Problem #4 history  of hypertension blood pressure seems to be reasonably controlled Problem #5 metabolic bone disease his calcium is slightly low however taking into account her low albumin his calcium is within normal range. And his phosphorus is improving. Problem #6 history of peripheral vascular disease Problem #7 history of arthritis. Plan: We'll put PPD today We'll check his hepatitis B surface antigen today. Awake arrangement to see patient in a week if is going to be discharged and then make a decision as an outpatient. If we are able to wait for 2-4 weeks we may be able to use his fistula. If not patient may need to have an Ash split catheter.   LOS: 7 days   Mukhtar Shams S 10/01/2012,10:56 AM

## 2012-10-01 NOTE — Progress Notes (Signed)
Subjective: He says he feels well. He had thoracentesis done and removed about a liter. He still has some trouble with his renal dysfunction.  Objective: Vital signs in last 24 hours: Temp:  [98.3 F (36.8 C)] 98.3 F (36.8 C) (01/09 0603) Pulse Rate:  [76-78] 78  (01/09 0603) Resp:  [20] 20  (01/09 0603) BP: (115-135)/(57-72) 135/65 mmHg (01/09 0603) SpO2:  [97 %-99 %] 97 % (01/09 0701) FiO2 (%):  [28 %] 28 % (01/08 2004) Weight:  [92.4 kg (203 lb 11.3 oz)] 92.4 kg (203 lb 11.3 oz) (01/09 0603) Weight change: 1.1 kg (2 lb 6.8 oz) Last BM Date: 09/29/12  Intake/Output from previous day: 01/08 0701 - 01/09 0700 In: 4388.3 [P.O.:720; I.V.:3618.3; IV Piggyback:50] Out: 1650 [Urine:1650]  PHYSICAL EXAM General appearance: alert, cooperative and no distress Resp: clear to auscultation bilaterally Cardio: regular rate and rhythm, S1, S2 normal, no murmur, click, rub or gallop GI: soft, non-tender; bowel sounds normal; no masses,  no organomegaly Extremities: extremities normal, atraumatic, no cyanosis or edema  Lab Results:    Basic Metabolic Panel:  Basename 10/01/12 0435 09/30/12 0436  NA 132* 130*  K 4.8 4.8  CL 100 99  CO2 16* 19  GLUCOSE 202* 231*  BUN 115* 111*  CREATININE 5.40* 5.56*  CALCIUM 7.9* 7.7*  MG -- --  PHOS -- --   Liver Function Tests:  Basename 10/01/12 0435 09/30/12 0436  AST 14 14  ALT 24 28  ALKPHOS 72 69  BILITOT 0.2* 0.2*  PROT 4.8* 5.3*  ALBUMIN 2.1* 2.3*   No results found for this basename: LIPASE:2,AMYLASE:2 in the last 72 hours No results found for this basename: AMMONIA:2 in the last 72 hours CBC:  Basename 09/29/12 0451  WBC 7.9  NEUTROABS --  HGB 9.4*  HCT 29.1*  MCV 86.6  PLT 309   Cardiac Enzymes: No results found for this basename: CKTOTAL:3,CKMB:3,CKMBINDEX:3,TROPONINI:3 in the last 72 hours BNP: No results found for this basename: PROBNP:3 in the last 72 hours D-Dimer: No results found for this basename:  DDIMER:2 in the last 72 hours CBG: No results found for this basename: GLUCAP:6 in the last 72 hours Hemoglobin A1C: No results found for this basename: HGBA1C in the last 72 hours Fasting Lipid Panel: No results found for this basename: CHOL,HDL,LDLCALC,TRIG,CHOLHDL,LDLDIRECT in the last 72 hours Thyroid Function Tests: No results found for this basename: TSH,T4TOTAL,FREET4,T3FREE,THYROIDAB in the last 72 hours Anemia Panel: No results found for this basename: VITAMINB12,FOLATE,FERRITIN,TIBC,IRON,RETICCTPCT in the last 72 hours Coagulation: No results found for this basename: LABPROT:2,INR:2 in the last 72 hours Urine Drug Screen: Drugs of Abuse  No results found for this basename: labopia, cocainscrnur, labbenz, amphetmu, thcu, labbarb    Alcohol Level: No results found for this basename: ETH:2 in the last 72 hours Urinalysis: No results found for this basename: COLORURINE:2,APPERANCEUR:2,LABSPEC:2,PHURINE:2,GLUCOSEU:2,HGBUR:2,BILIRUBINUR:2,KETONESUR:2,PROTEINUR:2,UROBILINOGEN:2,NITRITE:2,LEUKOCYTESUR:2 in the last 72 hours Misc. Labs:  ABGS No results found for this basename: PHART,PCO2,PO2ART,TCO2,HCO3 in the last 72 hours CULTURES Recent Results (from the past 240 hour(s))  AFB CULTURE WITH SMEAR     Status: Normal (Preliminary result)   Collection Time   09/25/12 12:00 PM      Component Value Range Status Comment   Specimen Description PLEURAL   Final    Special Requests NONE   Final    ACID FAST SMEAR NO ACID FAST BACILLI SEEN   Final    Culture     Final    Value: CULTURE WILL BE EXAMINED FOR 6 WEEKS  BEFORE ISSUING A FINAL REPORT   Report Status PENDING   Incomplete   BODY FLUID CULTURE     Status: Normal   Collection Time   09/25/12 12:00 PM      Component Value Range Status Comment   Specimen Description FLUID PLEURAL   Final    Special Requests NONE   Final    Gram Stain     Final    Value: NO WBC SEEN     NO ORGANISMS SEEN   Culture NO GROWTH 3 DAYS   Final     Report Status 09/29/2012 FINAL   Final    Studies/Results: Dg Chest 2 View  09/30/2012  *RADIOLOGY REPORT*  Clinical Data: Left pleural effusion.  Status post thoracentesis.  CHEST - 2 VIEW  Comparison: 09/28/2012  Findings: There has been a significant decrease in the left pleural effusion.  No pneumothorax.  Moderate left effusion does remain. There is a small right pleural effusion which is unchanged.  Chronic cardiomegaly.  Pulmonary vascularity is within normal limits.  No osseous abnormality.  IMPRESSION: Significant decrease in left pleural effusion.  No pneumothorax.   Original Report Authenticated By: Francene Boyers, M.D.    US Thoracentesis Asp Pleural Space W/img Guide  09/30/2012  *RADIOLOGY REPORT*  Clinical Data:  Left-sided pleural fluid.  ULTRASOUND GUIDED left THORACENTESIS  Comparison:  Plain film chest of 09/28/2012  An ultrasound guided thoracentesis was thoroughly discussed with the patient and questions answered.  The benefits, risks, alternatives and complications were also discussed.  The patient understands and wishes to proceed with the procedure.  Written consent was obtained.  Ultrasound was performed to localize and mark an adequate pocket of fluid in the left chest.  The area was then prepped and draped in the normal sterile fashion.  1% Lidocaine was used for local anesthesia.  Under ultrasound guidance a 19 gauge Yueh catheter was introduced.  Thoracentesis was performed.  The catheter was removed and a dressing applied.  Complications:  None  Findings: A total of approximately 1030 cc of clear/serous fluid was removed. A fluid sample was  sent for laboratory analysis.  IMPRESSION: Successful ultrasound guided left thoracentesis yielding 1030 of pleural fluid.   Original Report Authenticated By: Jeronimo Greaves, M.D.     Medications:  Scheduled:   . albuterol  2.5 mg Nebulization QID  . amLODipine  10 mg Oral q morning - 10a  . atorvastatin  40 mg Oral q1800  .  budesonide-formoterol  2 puff Inhalation BID  . calcitRIOL  0.5 mcg Oral Daily  . ceFEPime (MAXIPIME) IV  1 g Intravenous Q24H  . cloNIDine  0.2 mg Oral BID  . darbepoetin  60 mcg Subcutaneous Q14 Days  . enoxaparin (LOVENOX) injection  30 mg Subcutaneous Q24H  . furosemide  100 mg Intravenous BID  . hydrALAZINE  50 mg Oral TID  . ipratropium  0.5 mg Nebulization QID  . labetalol  400 mg Oral BID  . levofloxacin (LEVAQUIN) IV  500 mg Intravenous Q48H  . multivitamin with minerals  1 tablet Oral Daily  . predniSONE  20 mg Oral BID WC  . sodium chloride  3 mL Intravenous Q12H  . vancomycin  1,000 mg Intravenous Q48H   Continuous:   . sodium chloride 100 mL/hr at 09/30/12 1739   OZH:YQMVHQ chloride, acetaminophen, acetaminophen, albuterol, bisacodyl, HYDROcodone-homatropine, ondansetron (ZOFRAN) IV, ondansetron, oxyCODONE, sodium chloride, traZODone  Assesment: He has healthcare associated pneumonia. He has chronic renal failure. He had bilateral pleural  effusions and has had thoracentesis of each side. He feels better. Active Problems:  * No active hospital problems. *     Plan: I agree he can switch to oral oral medications and I think he probably would be okay for discharge    LOS: 7 days   Jeffie Widdowson L 10/01/2012, 8:20 AM

## 2012-10-02 LAB — HEPATITIS B SURFACE ANTIBODY,QUALITATIVE: Hep B S Ab: NONREACTIVE

## 2012-10-02 NOTE — Progress Notes (Signed)
UR chart review completed.  

## 2012-10-02 NOTE — Discharge Summary (Signed)
NAMEDELANCE, WEIDE NO.:  [id number]  MEDICAL RECORD NO.:  [id number]  LOCATION:  A315                          FACILITY:  [facility]  PHYSICIAN:  Melvyn Novas, MDDATE OF BIRTH:  1932/04/11  DATE OF ADMISSION:  09/24/2012 DATE OF DISCHARGE:  01/09/2014LH                              DISCHARGE SUMMARY   The patient is a 78 year old black male with accelerated hypertension, acute-on-chronic renal failure, intravascular volume overload, COPD, hyperlipidemia who was admitted with increasing dyspnea, found to have bilateral pleural effusions.  He had a shunt placed in his left arm 4 weeks ago.  It is not quite ready for dialysis.  We are postponing this in order for graft time to completely heal over.  Therefore, he had palliative therapy including diuresis.  He has nonoliguric renal failure, some mild hypoproteinemia, total protein of 5.8, albumin diminished to 2.5, creatinine around 5.5, which is stable.  His blood pressure is well controlled.  He had a right thoracentesis performed with significant relief and respiratory distress, and he subsequently had a left thoracentesis performed 2 days later with x-rays revealing no evidence of pneumothorax and good visual result with reduction of size of the pleural effusions.  The patient was placed on steroids as well as Levaquin in the hospital and he was placed on Levaquin 500 p.o. day for an additional 5 days  at discharge.  He will be weaned to prednisone 20 mg p.o. b.i.d. for 3 days and then 20 mg per day for 3 days, and then 10 mg per day for an additional 3 days, then discontinue.  DISCHARGE MEDICINES:  Albuterol nebulizer solution 0.5% q.i.d. Norvasc 10 mg p.o. daily, calcitriol 0.5 mcg daily, clonidine 0.2 mg p.o. b.i.d., Lasix 40 mg p.o. b.i.d., hydralazine 50 mg p.o. t.i.d., Normodyne 400 mg p.o. b.i.d., Levaquin 500 mg p.o. daily for an additional 5 days, Zocor 80 mg p.o. daily, and oxycodone 5 mg  p.o. q.i.d. p.r.n.  The patient will follow up in my office in 3 days time to check electrolytes, potassium, renal function, and respiratory fluid volume overload.     Melvyn Novas, MD     RMD/MEDQ  D:  10/01/2012  T:  10/02/2012  Job:  [id number]

## 2012-10-04 LAB — BODY FLUID CULTURE: Culture: NO GROWTH

## 2012-10-08 ENCOUNTER — Inpatient Hospital Stay (HOSPITAL_COMMUNITY)
Admission: EM | Admit: 2012-10-08 | Discharge: 2012-10-13 | DRG: 682 | Disposition: A | Discharge status: Home / Self Care | Payer: Medicare Other | Attending: Family Medicine | Admitting: Family Medicine

## 2012-10-08 ENCOUNTER — Emergency Department (HOSPITAL_COMMUNITY): Payer: Medicare Other

## 2012-10-08 ENCOUNTER — Encounter (HOSPITAL_COMMUNITY): Payer: Self-pay

## 2012-10-08 DIAGNOSIS — IMO0002 Reserved for concepts with insufficient information to code with codable children: Secondary | ICD-10-CM

## 2012-10-08 DIAGNOSIS — E785 Hyperlipidemia, unspecified: Secondary | ICD-10-CM

## 2012-10-08 DIAGNOSIS — I422 Other hypertrophic cardiomyopathy: Secondary | ICD-10-CM | POA: Diagnosis present

## 2012-10-08 DIAGNOSIS — R0902 Hypoxemia: Secondary | ICD-10-CM

## 2012-10-08 DIAGNOSIS — Z87891 Personal history of nicotine dependence: Secondary | ICD-10-CM

## 2012-10-08 DIAGNOSIS — N179 Acute kidney failure, unspecified: Principal | ICD-10-CM | POA: Diagnosis present

## 2012-10-08 DIAGNOSIS — E7800 Pure hypercholesterolemia, unspecified: Secondary | ICD-10-CM | POA: Diagnosis present

## 2012-10-08 DIAGNOSIS — Z79899 Other long term (current) drug therapy: Secondary | ICD-10-CM

## 2012-10-08 DIAGNOSIS — I429 Cardiomyopathy, unspecified: Secondary | ICD-10-CM | POA: Diagnosis present

## 2012-10-08 DIAGNOSIS — I509 Heart failure, unspecified: Secondary | ICD-10-CM

## 2012-10-08 DIAGNOSIS — M1990 Unspecified osteoarthritis, unspecified site: Secondary | ICD-10-CM | POA: Diagnosis present

## 2012-10-08 DIAGNOSIS — N184 Chronic kidney disease, stage 4 (severe): Secondary | ICD-10-CM

## 2012-10-08 DIAGNOSIS — N2581 Secondary hyperparathyroidism of renal origin: Secondary | ICD-10-CM | POA: Diagnosis present

## 2012-10-08 DIAGNOSIS — J810 Acute pulmonary edema: Secondary | ICD-10-CM

## 2012-10-08 DIAGNOSIS — I739 Peripheral vascular disease, unspecified: Secondary | ICD-10-CM | POA: Diagnosis present

## 2012-10-08 DIAGNOSIS — M109 Gout, unspecified: Secondary | ICD-10-CM | POA: Diagnosis present

## 2012-10-08 DIAGNOSIS — M129 Arthropathy, unspecified: Secondary | ICD-10-CM | POA: Diagnosis present

## 2012-10-08 DIAGNOSIS — J9819 Other pulmonary collapse: Secondary | ICD-10-CM | POA: Diagnosis present

## 2012-10-08 DIAGNOSIS — D631 Anemia in chronic kidney disease: Secondary | ICD-10-CM

## 2012-10-08 DIAGNOSIS — J189 Pneumonia, unspecified organism: Secondary | ICD-10-CM | POA: Diagnosis present

## 2012-10-08 DIAGNOSIS — I130 Hypertensive heart and chronic kidney disease with heart failure and stage 1 through stage 4 chronic kidney disease, or unspecified chronic kidney disease: Secondary | ICD-10-CM | POA: Diagnosis present

## 2012-10-08 DIAGNOSIS — N186 End stage renal disease: Secondary | ICD-10-CM

## 2012-10-08 DIAGNOSIS — I119 Hypertensive heart disease without heart failure: Secondary | ICD-10-CM

## 2012-10-08 LAB — COMPREHENSIVE METABOLIC PANEL
Albumin: 2.5 g/dL — ABNORMAL LOW (ref 3.5–5.2)
Alkaline Phosphatase: 72 U/L (ref 39–117)
BUN: 85 mg/dL — ABNORMAL HIGH (ref 6–23)
Calcium: 8.9 mg/dL (ref 8.4–10.5)
GFR calc Af Amer: 11 mL/min — ABNORMAL LOW (ref >90)
Glucose, Bld: 151 mg/dL — ABNORMAL HIGH (ref 70–99)
Potassium: 4.3 mEq/L (ref 3.5–5.1)
Total Protein: 5.8 g/dL — ABNORMAL LOW (ref 6.0–8.3)

## 2012-10-08 LAB — CBC WITH DIFFERENTIAL/PLATELET
Basophils Relative: 0 % (ref 0–1)
Eosinophils Absolute: 0.1 10*3/uL (ref 0.0–0.7)
Eosinophils Relative: 0 % (ref 0–5)
Hemoglobin: 8.9 g/dL — ABNORMAL LOW (ref 13.0–17.0)
MCH: 27.8 pg (ref 26.0–34.0)
MCHC: 32 g/dL (ref 30.0–36.0)
MCV: 86.9 fL (ref 78.0–100.0)
Monocytes Relative: 6 % (ref 3–12)
Neutrophils Relative %: 90 % — ABNORMAL HIGH (ref 43–77)
Platelets: 141 10*3/uL — ABNORMAL LOW (ref 150–400)

## 2012-10-08 LAB — DIFFERENTIAL
Basophils Absolute: 0 10*3/uL (ref 0.0–0.1)
Basophils Relative: 0 % (ref 0–1)
Lymphocytes Relative: 5 % — ABNORMAL LOW (ref 12–46)
Neutro Abs: 15.6 10*3/uL — ABNORMAL HIGH (ref 1.7–7.7)
Neutrophils Relative %: 92 % — ABNORMAL HIGH (ref 43–77)

## 2012-10-08 LAB — TROPONIN I
Troponin I: <0.3 ng/mL (ref <0.30)
Troponin I: <0.3 ng/mL (ref <0.30)

## 2012-10-08 LAB — RETICULOCYTES
Retic Count, Absolute: 61 10*3/uL (ref 19.0–186.0)
Retic Ct Pct: 1.9 % (ref 0.4–3.1)

## 2012-10-08 LAB — PRO B NATRIURETIC PEPTIDE: Pro B Natriuretic peptide (BNP): >70000 pg/mL — ABNORMAL HIGH (ref 0–450)

## 2012-10-08 LAB — CBC
MCHC: 32.9 g/dL (ref 30.0–36.0)
RDW: 17.9 % — ABNORMAL HIGH (ref 11.5–15.5)
WBC: 17 10*3/uL — ABNORMAL HIGH (ref 4.0–10.5)

## 2012-10-08 MED ORDER — LABETALOL HCL 200 MG PO TABS
300.0000 mg | ORAL_TABLET | Freq: Two times a day (BID) | ORAL | Status: DC
  Administered 2012-10-09 – 2012-10-13 (×9): 300 mg via ORAL
  Filled 2012-10-08 (×2): qty 2
  Filled 2012-10-08: qty 1
  Filled 2012-10-08 (×2): qty 2
  Filled 2012-10-08: qty 1
  Filled 2012-10-08: qty 2
  Filled 2012-10-08: qty 1
  Filled 2012-10-08 (×2): qty 2
  Filled 2012-10-08: qty 1
  Filled 2012-10-08: qty 2

## 2012-10-08 MED ORDER — ALBUTEROL SULFATE (5 MG/ML) 0.5% IN NEBU
2.5000 mg | INHALATION_SOLUTION | RESPIRATORY_TRACT | Status: DC | PRN
  Administered 2012-10-09: 2.5 mg via RESPIRATORY_TRACT
  Filled 2012-10-08: qty 0.5

## 2012-10-08 MED ORDER — FUROSEMIDE 10 MG/ML IJ SOLN
INTRAMUSCULAR | Status: AC
  Filled 2012-10-08: qty 20

## 2012-10-08 MED ORDER — OXYCODONE-ACETAMINOPHEN 5-325 MG PO TABS
1.0000 | ORAL_TABLET | ORAL | Status: DC | PRN
  Administered 2012-10-13: 1 via ORAL
  Filled 2012-10-08: qty 1

## 2012-10-08 MED ORDER — FUROSEMIDE 10 MG/ML IJ SOLN
40.0000 mg | Freq: Once | INTRAMUSCULAR | Status: AC
  Administered 2012-10-08: 40 mg via INTRAVENOUS
  Filled 2012-10-08: qty 4

## 2012-10-08 MED ORDER — SIMVASTATIN 80 MG PO TABS: 80.0000 mg | ORAL_TABLET | Freq: Every day | ORAL | Status: DC

## 2012-10-08 MED ORDER — ENOXAPARIN SODIUM 30 MG/0.3ML ~~LOC~~ SOLN
30.0000 mg | SUBCUTANEOUS | Status: DC
  Administered 2012-10-09 – 2012-10-12 (×5): 30 mg via SUBCUTANEOUS
  Filled 2012-10-08 (×5): qty 0.3

## 2012-10-08 MED ORDER — PIPERACILLIN-TAZOBACTAM IN DEX 2-0.25 GM/50ML IV SOLN
2.2500 g | Freq: Three times a day (TID) | INTRAVENOUS | Status: DC
  Administered 2012-10-09 – 2012-10-13 (×13): 2.25 g via INTRAVENOUS
  Filled 2012-10-08 (×19): qty 50

## 2012-10-08 MED ORDER — LABETALOL HCL 300 MG PO TABS: 300.0000 mg | ORAL_TABLET | Freq: Two times a day (BID) | ORAL | Status: DC

## 2012-10-08 MED ORDER — HYDRALAZINE HCL 50 MG PO TABS: 50.0000 mg | ORAL_TABLET | Freq: Three times a day (TID) | ORAL | Status: DC

## 2012-10-08 MED ORDER — FUROSEMIDE 10 MG/ML IJ SOLN
100.0000 mg | Freq: Two times a day (BID) | INTRAMUSCULAR | Status: DC
  Filled 2012-10-08 (×4): qty 10

## 2012-10-08 MED ORDER — FUROSEMIDE 10 MG/ML IJ SOLN: 1.0000 mg/kg | Freq: Two times a day (BID) | INTRAVENOUS | Status: DC

## 2012-10-08 MED ORDER — AMLODIPINE BESYLATE 5 MG PO TABS
10.0000 mg | ORAL_TABLET | Freq: Every day | ORAL | Status: DC
  Administered 2012-10-09 – 2012-10-13 (×5): 10 mg via ORAL
  Filled 2012-10-08 (×5): qty 2

## 2012-10-08 MED ORDER — ATORVASTATIN CALCIUM 40 MG PO TABS
40.0000 mg | ORAL_TABLET | Freq: Every day | ORAL | Status: DC
  Administered 2012-10-09 – 2012-10-12 (×5): 40 mg via ORAL
  Filled 2012-10-08 (×6): qty 1

## 2012-10-08 MED ORDER — OXYCODONE HCL 5 MG PO TABS
5.0000 mg | ORAL_TABLET | ORAL | Status: DC | PRN
  Administered 2012-10-12 – 2012-10-13 (×3): 5 mg via ORAL
  Filled 2012-10-08 (×3): qty 1

## 2012-10-08 MED ORDER — HYDRALAZINE HCL 25 MG PO TABS
50.0000 mg | ORAL_TABLET | Freq: Three times a day (TID) | ORAL | Status: DC
  Administered 2012-10-09 – 2012-10-13 (×13): 50 mg via ORAL
  Filled 2012-10-08: qty 2
  Filled 2012-10-08: qty 1
  Filled 2012-10-08 (×2): qty 2
  Filled 2012-10-08 (×2): qty 1
  Filled 2012-10-08 (×2): qty 2
  Filled 2012-10-08: qty 1
  Filled 2012-10-08: qty 2
  Filled 2012-10-08: qty 1
  Filled 2012-10-08 (×2): qty 2
  Filled 2012-10-08: qty 1
  Filled 2012-10-08 (×4): qty 2
  Filled 2012-10-08: qty 1

## 2012-10-08 MED ORDER — PIPERACILLIN-TAZOBACTAM IN DEX 2-0.25 GM/50ML IV SOLN
INTRAVENOUS | Status: AC
  Filled 2012-10-08: qty 50

## 2012-10-08 MED ORDER — CLONIDINE HCL 0.2 MG PO TABS
0.2000 mg | ORAL_TABLET | Freq: Two times a day (BID) | ORAL | Status: DC
  Administered 2012-10-09 – 2012-10-13 (×9): 0.2 mg via ORAL
  Filled 2012-10-08 (×10): qty 1

## 2012-10-08 MED ORDER — ISOSORBIDE MONONITRATE ER 60 MG PO TB24
60.0000 mg | ORAL_TABLET | Freq: Every day | ORAL | Status: DC
  Administered 2012-10-09 – 2012-10-13 (×5): 60 mg via ORAL
  Filled 2012-10-08 (×7): qty 1

## 2012-10-08 MED ORDER — IPRATROPIUM-ALBUTEROL 0.5-2.5 (3) MG/3ML IN SOLN: 3.0000 mL | RESPIRATORY_TRACT | Status: DC | PRN

## 2012-10-08 MED ORDER — FUROSEMIDE 10 MG/ML IJ SOLN
160.0000 mg | Freq: Once | INTRAVENOUS | Status: AC
  Administered 2012-10-08: 160 mg via INTRAVENOUS
  Filled 2012-10-08: qty 16

## 2012-10-08 MED ORDER — IPRATROPIUM BROMIDE 0.02 % IN SOLN
0.5000 mg | RESPIRATORY_TRACT | Status: DC | PRN
  Administered 2012-10-09: 0.5 mg via RESPIRATORY_TRACT
  Filled 2012-10-08: qty 2.5

## 2012-10-08 MED ORDER — VANCOMYCIN HCL 10 G IV SOLR
1500.0000 mg | Freq: Once | INTRAVENOUS | Status: AC
  Administered 2012-10-09: 1500 mg via INTRAVENOUS
  Filled 2012-10-08: qty 1500

## 2012-10-08 NOTE — ED Notes (Signed)
According to Dr. Janna Arch pt is not to receive 100mg  of lasix at this time due to pt already getting 160mg .

## 2012-10-08 NOTE — ED Notes (Signed)
Attempted to call report but nurse is not ready at this time.

## 2012-10-08 NOTE — ED Provider Notes (Signed)
History   Scribed for Geoffery Lyons, MD, the patient was seen in room [facility] . This chart was scribed by Lewanda Rife.    CSN: [id number]  Arrival date & time 10/08/12  2725   First MD Initiated Contact with Patient 10/08/12 1848      Chief Complaint  Patient presents with  . Shortness of Breath    (Consider location/radiation/quality/duration/timing/severity/associated sxs/prior treatment) HPI Randy [name] is a 79 y.o. male who presents to the Emergency Department complaining of acute moderate shortness of breath and vomiting since this afternoon. Pt denies having chest pain or discomfort. Pt reports increased swelling in lower extremities. Pt reports he has been voiding less than normal. Pt states he is supposed to start his dialysis treatments tomorrow. Pts had a recent admission for hospital-acquired pneumonia, anemia in chronic kidney disease, other and unspecified hyperlipemia, chronic kidney disease stage 4, GFR 15-29 mil/min, acute pulmonary edema, and end-stage renal disease.     Past Medical History  Diagnosis Date  . Hypertensive heart disease   . Hypercholesterolemia   . Gout   . Secondary cardiomyopathy     LVEF 40-45%  . Anemia of chronic disease   . Hypertension   . CHF (congestive heart failure)   . Shortness of breath   . Arthritis   . CKD (chronic kidney disease) stage 4, GFR 15-29 ml/min     Past Surgical History  Procedure Date  . Knee surgery 2007    Right knee, arthroscopy  . Back surgery 2004    lumbar  . Hemorrhoid surgery 40 years ago  . Ganglion cyst excision 01/03/2012    Procedure: REMOVAL GANGLION OF WRIST;  Surgeon: Marlane Hatcher, MD;  Location: [facility];  Service: General;  Laterality: Right;  . Colonscopy   . Av fistula placement 08/24/2012    Procedure: ARTERIOVENOUS (AV) FISTULA CREATION;  Surgeon: Larina Earthly, MD;  Location: [facility] OR;  Service: Vascular;  Laterality: Left;    Family History  Problem Relation Age of Onset   . Arthritis    . Cancer    . Kidney disease    . Anesthesia problems Neg Hx   . Hypotension Neg Hx   . Malignant hyperthermia Neg Hx   . Pseudochol deficiency Neg Hx   . Cancer Sister   . Cancer Brother     colon  . Cancer Brother     colon    History  Substance Use Topics  . Smoking status: Former Smoker -- 1.0 packs/day for 30 years    Types: Cigarettes    Quit date: 08/19/1998  . Smokeless tobacco: Former Neurosurgeon    Quit date: 12/31/1993     Comment: quit cigs in 1999  . Alcohol Use: No      Review of Systems  Constitutional: Negative.   HENT: Negative.   Respiratory: Positive for shortness of breath.   Cardiovascular: Positive for leg swelling. Negative for chest pain.  Gastrointestinal: Positive for vomiting.  Musculoskeletal: Negative.   Skin: Negative.   Neurological: Negative.   Hematological: Negative.   Psychiatric/Behavioral: Negative.   All other systems reviewed and are negative.    Allergies  Review of patient's allergies indicates no known allergies.  Home Medications   Current Outpatient Rx  Name  Route  Sig  Dispense  Refill  . ALBUTEROL SULFATE (5 MG/ML) 0.5% IN NEBU   Nebulization   Take 0.5 mLs (2.5 mg total) by nebulization 4 (four) times daily.   20 mL  2   . AMLODIPINE BESYLATE 10 MG PO TABS   Oral   Take 10 mg by mouth every morning.          Marland Kitchen CALCITRIOL 0.5 MCG PO CAPS   Oral   Take 1 capsule (0.5 mcg total) by mouth daily.   30 capsule   3   . CLONIDINE HCL 0.2 MG PO TABS   Oral   Take 1 tablet (0.2 mg total) by mouth 2 (two) times daily.   60 tablet   3   . FUROSEMIDE 40 MG PO TABS   Oral   Take 40 mg by mouth 2 (two) times daily.         Marland Kitchen HYDRALAZINE HCL 50 MG PO TABS   Oral   Take 1 tablet (50 mg total) by mouth 3 (three) times daily.   90 tablet   3   . LABETALOL HCL 200 MG PO TABS   Oral   Take 2 tablets (400 mg total) by mouth 2 (two) times daily.   120 tablet   6   . ADULT MULTIVITAMIN  W/MINERALS CH   Oral   Take 1 tablet by mouth daily.         . OXYCODONE HCL 5 MG PO TABS   Oral   Take 1 tablet (5 mg total) by mouth every 4 (four) hours as needed for pain.   30 tablet   0   . PREDNISONE 20 MG PO TABS   Oral   Take 1 tablet (20 mg total) by mouth 2 (two) times daily with a meal.         . SIMVASTATIN 80 MG PO TABS   Oral   Take 80 mg by mouth at bedtime.             BP 157/88  Pulse 95  Temp 98.4 F (36.9 C) (Oral)  Resp 28  SpO2 97%  Physical Exam  Nursing note and vitals reviewed. Constitutional: He is oriented to person, place, and time. He appears well-developed and well-nourished.  HENT:  Head: Normocephalic and atraumatic.  Eyes: Conjunctivae normal are normal. Pupils are equal, round, and reactive to light.  Neck: Neck supple. No tracheal deviation present. No thyromegaly present.  Cardiovascular: Normal rate and regular rhythm.   No murmur heard. Pulmonary/Chest: Accessory muscle usage present. Tachypnea noted. He has rales.       Moderate respiratory distress with rales in the bases bilaterally.   Abdominal: Soft. Bowel sounds are normal. He exhibits no distension. There is no tenderness.  Musculoskeletal: Normal range of motion. He exhibits edema (3+ edema in bilateral lower extremities ). He exhibits no tenderness.  Neurological: He is alert and oriented to person, place, and time. Coordination normal.  Skin: Skin is warm and dry. No rash noted.  Psychiatric: He has a normal mood and affect.    ED Course  Procedures (including critical care time)  Labs Reviewed - No data to display No results found.   No diagnosis found.   Date: 10/08/2012  Rate: 94  Rhythm: normal sinus rhythm  QRS Axis: normal  Intervals: normal  ST/T Wave abnormalities: normal  Conduction Disutrbances:none  Narrative Interpretation:   Old EKG Reviewed: unchanged    MDM  The patient has a history of ESRD and had a fistula placed in his left arm  that is in the process of maturing.  He was recently admitted for suspected pneumonia and chf.  He returns today with shortness  of breath and increased lower extremity edema.  The workup reveals a markedly elevated bnp, the le's have 3+ edema and the xray is suggestive chf, possible pneumonia.    He was given iv lasix and Dr. Kristian Covey was consulted.  He recommended admission for high dose lasix and dialysis should he not respond.  Dr. Janna Arch consulted and will admit the patient.        I personally performed the services described in this documentation, which was scribed in my presence. The recorded information has been reviewed and is accurate.       Geoffery Lyons, MD 10/08/12 2350

## 2012-10-08 NOTE — ED Notes (Signed)
Pt given a urinal.

## 2012-10-08 NOTE — Progress Notes (Signed)
ANTIBIOTIC CONSULT NOTE - INITIAL  Pharmacy Consult for Vancomycin & Zosyn Indication: rule out pneumonia  No Known Allergies  Patient Measurements:  Patient weight approximately 92 kg  Vital Signs: Temp: 98.2 F (36.8 C) (01/16 1907) Temp src: Oral (01/16 1907) BP: 160/81 mmHg (01/16 2200) Pulse Rate: 90  (01/16 2200) Intake/Output from previous day:   Intake/Output from this shift:    Labs:  [facility] 10/08/12 1857  WBC 16.3*  HGB 8.9*  PLT 141*  LABCREA --  CREATININE 5.25*   The CrCl is unknown because both a height and weight (above a minimum accepted value) are required for this calculation. No results found for this basename: VANCOTROUGH:2,VANCOPEAK:2,VANCORANDOM:2,GENTTROUGH:2,GENTPEAK:2,GENTRANDOM:2,TOBRATROUGH:2,TOBRAPEAK:2,TOBRARND:2,AMIKACINPEAK:2,AMIKACINTROU:2,AMIKACIN:2, in the last 72 hours   Microbiology: Recent Results (from the past 720 hour(s))  AFB CULTURE WITH SMEAR     Status: Normal (Preliminary result)   Collection Time   09/25/12 12:00 PM      Component Value Range Status Comment   Specimen Description PLEURAL   Final    Special Requests NONE   Final    ACID FAST SMEAR NO ACID FAST BACILLI SEEN   Final    Culture     Final    Value: CULTURE WILL BE EXAMINED FOR 6 WEEKS BEFORE ISSUING A FINAL REPORT   Report Status PENDING   Incomplete   BODY FLUID CULTURE     Status: Normal   Collection Time   09/25/12 12:00 PM      Component Value Range Status Comment   Specimen Description FLUID PLEURAL   Final    Special Requests NONE   Final    Gram Stain     Final    Value: NO WBC SEEN     NO ORGANISMS SEEN   Culture NO GROWTH 3 DAYS   Final    Report Status 09/29/2012 FINAL   Final   AFB CULTURE WITH SMEAR     Status: Normal (Preliminary result)   Collection Time   09/30/12  1:47 PM      Component Value Range Status Comment   Specimen Description FLUID PLEURAL   Final    Special Requests NONE   Final    ACID FAST SMEAR NO ACID FAST BACILLI SEEN    Final    Culture     Final    Value: CULTURE WILL BE EXAMINED FOR 6 WEEKS BEFORE ISSUING A FINAL REPORT   Report Status PENDING   Incomplete   BODY FLUID CULTURE     Status: Normal   Collection Time   09/30/12  1:47 PM      Component Value Range Status Comment   Specimen Description FLUID PLEURAL   Final    Special Requests NONE   Final    Gram Stain     Final    Value: NO WBC SEEN     NO ORGANISMS SEEN   Culture NO GROWTH 3 DAYS   Final    Report Status 10/04/2012 FINAL   Final     Medical History: Past Medical History  Diagnosis Date  . Hypertensive heart disease   . Hypercholesterolemia   . Gout   . Secondary cardiomyopathy     LVEF 40-45%  . Anemia of chronic disease   . Hypertension   . CHF (congestive heart failure)   . Shortness of breath   . Arthritis   . CKD (chronic kidney disease) stage 4, GFR 15-29 ml/min     Medications:  Scheduled:    . amLODipine  10 mg Oral Daily  . atorvastatin  40 mg Oral q1800  . cloNIDine  0.2 mg Oral BID  . enoxaparin (LOVENOX) injection  30 mg Subcutaneous Q24H  . [COMPLETED] furosemide  40 mg Intravenous Once  . hydrALAZINE  50 mg Oral Q8H  . isosorbide mononitrate  60 mg Oral Daily  . labetalol  300 mg Oral BID  . [DISCONTINUED] furosemide  1 mg/kg Intravenous Q12H  . [DISCONTINUED] hydrALAZINE  50 mg Oral Q8H  . [DISCONTINUED] labetalol  300 mg Oral BID  . [DISCONTINUED] simvastatin  80 mg Oral q1800   Assessment: Reduced renal function SCr 5.25 CrCl approximately 10 ml/min  Goal of Therapy:  Vancomycin trough level 15-20 mcg/ml  Plan:  Vancomycin 1500 mg IV x 1 dose tonight, pharmacy to follow up in AM with additional dosing. Zosyn 2.25 GM IV every 8 hours Vancomycin trough at steady state Monitor renal function Labs per protocol  Raquel James, Terrance Lanahan Bennett 10/08/2012,10:23 PM

## 2012-10-08 NOTE — ED Notes (Signed)
Consulted my EDP about placing a foley catheter and he stated that the pt did not need one at this time.

## 2012-10-08 NOTE — ED Notes (Signed)
Family wants to talk with EDP, EDP notified and stated he will when the pt's lab work comes back.

## 2012-10-08 NOTE — ED Notes (Signed)
Pt c/o vomiting and SOB since this afternoon.  Says was admitted last week for fluid in lungs.

## 2012-10-08 NOTE — H&P (Signed)
[id number] 

## 2012-10-08 NOTE — Progress Notes (Signed)
ANTICOAGULATION CONSULT NOTE - Initial Consult  Pharmacy Consult for Lovenox Indication: VTE prophylaxis  No Known Allergies  Patient Measurements:  Patient weight approximately 92 KG  Vital Signs: Temp: 98.2 F (36.8 C) (01/16 1907) Temp src: Oral (01/16 1907) BP: 160/81 mmHg (01/16 2200) Pulse Rate: 90  (01/16 2200)  Labs:  [facility] 10/08/12 1857  HGB 8.9*  HCT 27.8*  PLT 141*  APTT --  LABPROT --  INR --  HEPARINUNFRC --  CREATININE 5.25*  CKTOTAL --  CKMB --  TROPONINI <0.30    The CrCl is unknown because both a height and weight (above a minimum accepted value) are required for this calculation.   Medical History: Past Medical History  Diagnosis Date  . Hypertensive heart disease   . Hypercholesterolemia   . Gout   . Secondary cardiomyopathy     LVEF 40-45%  . Anemia of chronic disease   . Hypertension   . CHF (congestive heart failure)   . Shortness of breath   . Arthritis   . CKD (chronic kidney disease) stage 4, GFR 15-29 ml/min     Medications:  Scheduled:    . amLODipine  10 mg Oral Daily  . atorvastatin  40 mg Oral q1800  . cloNIDine  0.2 mg Oral BID  . enoxaparin (LOVENOX) injection  30 mg Subcutaneous Q24H  . [COMPLETED] furosemide  40 mg Intravenous Once  . hydrALAZINE  50 mg Oral Q8H  . isosorbide mononitrate  60 mg Oral Daily  . labetalol  300 mg Oral BID  . [DISCONTINUED] furosemide  1 mg/kg Intravenous Q12H  . [DISCONTINUED] hydrALAZINE  50 mg Oral Q8H  . [DISCONTINUED] labetalol  300 mg Oral BID  . [DISCONTINUED] simvastatin  80 mg Oral q1800   PRN: albuterol, ipratropium, oxyCODONE, oxyCODONE-acetaminophen, [DISCONTINUED] ipratropium-albuterol  Assessment: Reduced renal function CrCl < 30 ml/min  Goal of Therapy:  Lovenox prophylaxis dosing Monitor platelets by anticoagulation protocol: Yes   Plan:  Lovenox 30 mg SQ every 24 hours Monitor platelets and CBC Labs per protocol  Raquel James, Emalia Witkop Bennett 10/08/2012,10:26  PM

## 2012-10-09 ENCOUNTER — Ambulatory Visit (HOSPITAL_COMMUNITY): Payer: Medicare Other

## 2012-10-09 LAB — HEPATIC FUNCTION PANEL
ALT: 47 U/L (ref 0–53)
Bilirubin, Direct: 0.1 mg/dL (ref 0.0–0.3)
Indirect Bilirubin: 0.4 mg/dL (ref 0.3–0.9)

## 2012-10-09 LAB — TROPONIN I
Troponin I: <0.3 ng/mL (ref <0.30)
Troponin I: <0.3 ng/mL (ref <0.30)

## 2012-10-09 LAB — BASIC METABOLIC PANEL
BUN: 86 mg/dL — ABNORMAL HIGH (ref 6–23)
Calcium: 8.7 mg/dL (ref 8.4–10.5)
Chloride: 103 mEq/L (ref 96–112)
Creatinine, Ser: 5.47 mg/dL — ABNORMAL HIGH (ref 0.50–1.35)
GFR calc Af Amer: 10 mL/min — ABNORMAL LOW (ref >90)
GFR calc non Af Amer: 9 mL/min — ABNORMAL LOW (ref >90)

## 2012-10-09 LAB — TSH: TSH: 1.312 u[IU]/mL (ref 0.350–4.500)

## 2012-10-09 MED ORDER — SODIUM CHLORIDE 0.9 % IV SOLN
100.0000 mL | INTRAVENOUS | Status: DC | PRN
  Administered 2012-10-10 (×2): 10 mL via INTRAVENOUS

## 2012-10-09 MED ORDER — ALBUTEROL SULFATE (5 MG/ML) 0.5% IN NEBU
2.5000 mg | INHALATION_SOLUTION | RESPIRATORY_TRACT | Status: DC
  Administered 2012-10-09 – 2012-10-13 (×17): 2.5 mg via RESPIRATORY_TRACT
  Filled 2012-10-09 (×18): qty 0.5

## 2012-10-09 MED ORDER — SODIUM CHLORIDE 0.9 % IV SOLN: 100.0000 mL | INTRAVENOUS | Status: DC | PRN

## 2012-10-09 MED ORDER — HEPARIN SODIUM (PORCINE) 1000 UNIT/ML DIALYSIS: 1000.0000 [IU] | INTRAMUSCULAR | Status: DC | PRN

## 2012-10-09 MED ORDER — LIDOCAINE HCL (PF) 1 % IJ SOLN: 5.0000 mL | INTRAMUSCULAR | Status: DC | PRN

## 2012-10-09 MED ORDER — IPRATROPIUM BROMIDE 0.02 % IN SOLN
0.5000 mg | RESPIRATORY_TRACT | Status: DC
  Administered 2012-10-09 – 2012-10-13 (×17): 0.5 mg via RESPIRATORY_TRACT
  Filled 2012-10-09 (×18): qty 2.5

## 2012-10-09 MED ORDER — FUROSEMIDE 10 MG/ML IJ SOLN
200.0000 mg | Freq: Two times a day (BID) | INTRAVENOUS | Status: DC
  Administered 2012-10-09 – 2012-10-11 (×6): 200 mg via INTRAVENOUS
  Filled 2012-10-09 (×7): qty 20

## 2012-10-09 MED ORDER — ALTEPLASE 2 MG IJ SOLR: 2.0000 mg | Freq: Once | INTRAMUSCULAR | Status: DC | PRN

## 2012-10-09 MED ORDER — PENTAFLUOROPROP-TETRAFLUOROETH EX AERO
1.0000 "application " | INHALATION_SPRAY | CUTANEOUS | Status: DC | PRN
  Filled 2012-10-09: qty 103.5

## 2012-10-09 MED ORDER — HEPARIN SODIUM (PORCINE) 1000 UNIT/ML DIALYSIS
1000.0000 [IU] | INTRAMUSCULAR | Status: DC | PRN
  Filled 2012-10-09: qty 1

## 2012-10-09 MED ORDER — ALTEPLASE 2 MG IJ SOLR
2.0000 mg | Freq: Once | INTRAMUSCULAR | Status: AC | PRN
  Filled 2012-10-09: qty 2

## 2012-10-09 MED ORDER — PENTAFLUOROPROP-TETRAFLUOROETH EX AERO: 1.0000 "application " | INHALATION_SPRAY | CUTANEOUS | Status: DC | PRN

## 2012-10-09 MED ORDER — VANCOMYCIN HCL IN DEXTROSE 1-5 GM/200ML-% IV SOLN
1000.0000 mg | INTRAVENOUS | Status: DC
  Administered 2012-10-09: 1000 mg via INTRAVENOUS
  Filled 2012-10-09 (×2): qty 200

## 2012-10-09 MED ORDER — LIDOCAINE-PRILOCAINE 2.5-2.5 % EX CREA: 1.0000 "application " | TOPICAL_CREAM | CUTANEOUS | Status: DC | PRN

## 2012-10-09 MED ORDER — NEPRO/CARBSTEADY PO LIQD: 237.0000 mL | ORAL | Status: DC | PRN

## 2012-10-09 MED ORDER — LIDOCAINE-PRILOCAINE 2.5-2.5 % EX CREA
1.0000 "application " | TOPICAL_CREAM | CUTANEOUS | Status: DC | PRN
  Filled 2012-10-09: qty 5

## 2012-10-09 NOTE — Progress Notes (Signed)
Arrangements have been made for pt to go to Interventional Radiology for insertion of hemodialysis catheter. Pt will be transported via care link and then return to [facility] and should receive dialysis this afternoon. Dialysis nurse notified.

## 2012-10-09 NOTE — H&P (Signed)
NAMENAVID, LENZEN NO.:  [id number]  MEDICAL RECORD NO.:  [id number]  LOCATION:  IC07                          FACILITY:  [facility]  PHYSICIAN:  Melvyn Novas, MDDATE OF BIRTH:  May 28, 1932  DATE OF ADMISSION:  10/08/2012 DATE OF DISCHARGE:  LH                             HISTORY & PHYSICAL   The patient is a 79 year old black male with a chronic renal failure, creatinine hovering around 5 who had a shunt placed, but was not quite ready for dialysis.  He was admitted 2 weeks ago with left lower lobe infiltrate, and intravascular volume overload.  He was diuresed and put on triple antibiotics at that time, I believe, and has been home since he complains of increasing shortness of breath this evening and comes in with chest x-ray consistent with left lower lobe atelectasis/infiltrate, mostly volume overload and proBNP greater than 70,000.  My clinical impression is volume overload here and he will be given IV Lasix and nitrates to reduce preload, however, but he truly needs his shunt, intravenous catheter placed for dialysis in my opinion at present.  He denies anginal chest pain, palpitations, dizziness, or syncope.  He does have orthopnea.  He will be placed in a ICU.  Cardiac enzymes are pending at the time of this dictation.  PAST MEDICAL HISTORY:  Significant for hypertrophic cardiomyopathy, diastolic dysfunction, hypertensive heart disease, hyperlipidemia gout, congestive heart failure, arthritis, and chronic renal failure.  PAST SURGICAL HISTORY:  Remarkable for right knee arthroscopy in 2007, lumbar laminectomy in 2004, hemorrhoidectomy many years ago.  CURRENT MEDICINES:  Norvasc 10 mg p.o. daily, clonidine 0.2 mg p.o. b.i.d., Lasix 40 mg p.o. b.i.d., hydralazine 50 mg p.o. t.i.d., labetalol 400 mg p.o. b.i.d., oxycodone 5 mg p.o. q.i.d. p.r.n., Zocor 80 mg p.o. daily, calcitriol 0.5 mg p.o. daily, and albuterol nebulizer q.i.d.  p.r.n.  PHYSICAL EXAMINATION:  VITAL SIGNS:  Blood pressure at present is 164/92, pulse is 88 and regular, respiratory rate is 20.  He is afebrile. HEENT:  Eyes, PERRLA intact.  Sclerae clear.  Conjunctivae pink. NECK:  No JVD.  No carotid bruits.  No thyromegaly.  No thyroid bruits. LUNGS:  Clear to A and P.  Lungs show no rales.  Appreciable diminished breath sounds at the bases.  No wheeze audible. HEART:  Regular rhythm.  No S3 audible.  S4 positive.  No heaves, thrills, or rubs. ABDOMEN:  Soft, nontender.  Bowel sounds normoactive.  No guarding, rebound, mass, or megaly. EXTREMITIES:  Showed 2+ pedal edema.  IMPRESSION: 1. Intravascular volume overload secondary to chronic and acute renal     failure. 2. Chronic congestive heart failure with hypertrophic cardiomyopathy     without outflow obstruction, diastolic dysfunction. 3. Hypertension, suboptimally controlled. 4. Chronic renal failure. 5. Hyperlipidemia. 6. Left lower lobe atelectasis/infiltrate.  PLAN:  Plan right now is to give nitrates, Lasix 100 mg IV q.12. Nephrology consult.  We will empirically place on vanc and Zosyn to not obstruct any possibility of infection before acute dialysis placement. We will continue all other current medicines and place intravenous catheter for dialysis in a.m.     Melvyn Novas, MD  RMD/MEDQ  D:  10/08/2012  T:  10/09/2012  Job:  [id number]

## 2012-10-09 NOTE — Progress Notes (Signed)
Aware of request for HD access. Has immature AVF that is not able to be used yet. Chart reviewed, rising WBC and ?PNA by CXR D/W Dr. Archer Asa and agree to place Temp cath that can be converted to Permcath if any acute infectious issues are resolved. D/w Dr. Kristian Covey as well. Will have [facility] bring pt to [facility] and see/consent pt upon arrival.  Brayton El PA_C

## 2012-10-09 NOTE — Progress Notes (Signed)
MESSAGE LEFT W/ [facility] FOR DIATEK CATHETER INSERTION.

## 2012-10-09 NOTE — Care Management Note (Signed)
    Page 1 of 1   10/13/2012     1:09:00 PM   CARE MANAGEMENT NOTE 10/13/2012  Patient:  Randy [name], Randy [name]   Account Number:  [id number]  Date Initiated:  10/09/2012  Documentation initiated by:  Sharrie Rothman  Subjective/Objective Assessment:   Pt admitted from home with CHF. Pt lives with his wife. Pt is going to be a new dialysis pt.     Action/Plan:   CSW will arrange outpt dialysis prior to discharge. Will continue to follow for [facility] needs.   Anticipated DC Date:  10/12/2012   Anticipated DC Plan:  HOME/SELF CARE      DC Planning Services  CM consult      Choice offered to / List presented to:             Status of service:  Completed, signed off Medicare Important Message given?  YES (If response is "NO", the following Medicare IM given date fields will be blank) Date Medicare IM given:  10/13/2012 Date Additional Medicare IM given:    Discharge Disposition:  HOME/SELF CARE  Per UR Regulation:    If discussed at Long Length of Stay Meetings, dates discussed:   10/13/2012    Comments:  10/13/12 1307 Arlyss Queen, RN BSN CM Pt discharged home today. No CM or HH needs noted. CSW has arranged outpt dialysis.  10/09/12 1600 Arlyss Queen, RN BSN CM

## 2012-10-09 NOTE — Progress Notes (Signed)
PT RETURNED FROM [facility] W/ TEMPORARY DIALYSIS CATHETER IN PLACE IN RT UPPER CHEST WALL..VSS. PT ALERT AND ORIENTED.

## 2012-10-09 NOTE — Plan of Care (Signed)
Problem: Problem: Cardiovascular Progression Goal: HEMODYNAMIC STABILITY Outcome: Progressing Pt is in NSR  BP=150/78 Resp =11 with saturations=100% on [place] 2 liters Goal: NO ARRHYTHMIAS Outcome: Progressing Pt has been in Sinus Rhythm w/o any ectopy Goal: NO EVIDENCE OF VOLUME OVERLOAD Outcome: Not Progressing Pt's ankles & feet have 2+edema. Pt's BNP is >7000  Pt had 160mg  lasix while in ED. Pt has voided chart record of  950cc of urine. Pt has CKD & has a left arm AV-fistula but has not yet begun dialysis. Goal: NO BP ELEVATION Outcome: Progressing BP= 156/87 currently & has been stable since ICU arrival Goal: PULSES PRESENT/NOT DIMINISHED Outcome: Progressing Pt's radials are 2+    DP's&PT's are doppler audible

## 2012-10-09 NOTE — Progress Notes (Signed)
PT HAS BEEN TRANSPORTED TO [facility].

## 2012-10-09 NOTE — Progress Notes (Signed)
[id number] 

## 2012-10-09 NOTE — Procedures (Signed)
First ever hemodialysis treatment ordered for today.  Left forearm Cimino AVF (created 08/24/12) with positive albeit weak thrill.  Pt denies exercising left arm, "No one told me to."  No other access available for hemodialysis. Venous end was cannulated with 17g needle.  Immediate blood return noticed, but 1.5cm infiltration became evident upon attempt to advance needle further.  Needle was removed; pressure and ice pack applied.  Hemostasis achieved within 5 minutes.  Discussed with Dr. Kristian Covey.  Arrangements are being made by primary RN for tunneled catheter placement at [facility].  Pt return demonstrated correct exercises to assist in AVF arterialization.

## 2012-10-09 NOTE — Consult Note (Signed)
Reason for Consult: CHF and chronic renal failure Referring Physician: Dr. [name] is an 79 y.o. male.  HPI: He is a patient was history of hypertension, cardiomyopathy and chronic renal failure reaching end-stage her presently had came with complaints of cough, difficulty breathing, orthopnea and paroxysmal nocturnal dyspnea of the last couple of days. Patient was here recently for similar problem and during that time he was found to have pneumonia and also bilateral pleural effusion. Patient had thoracentesis improved and discharged home to be followed as an outpatient. According to patient after couple of days he started  having swelling of the leg and difficulty in breathing. Patient presently denies any nausea vomiting and he is slightly better. He has also coughed but nonproductive. He denies any chest pain.  Past Medical History  Diagnosis Date  . Hypertensive heart disease   . Hypercholesterolemia   . Gout   . Secondary cardiomyopathy     LVEF 40-45%  . Anemia of chronic disease   . Hypertension   . CHF (congestive heart failure)   . Shortness of breath   . Arthritis   . CKD (chronic kidney disease) stage 4, GFR 15-29 ml/min     Past Surgical History  Procedure Date  . Knee surgery 2007    Right knee, arthroscopy  . Back surgery 2004    lumbar  . Hemorrhoid surgery 40 years ago  . Ganglion cyst excision 01/03/2012    Procedure: REMOVAL GANGLION OF WRIST;  Surgeon: Marlane Hatcher, MD;  Location: [facility] ORS;  Service: General;  Laterality: Right;  . Colonscopy   . Av fistula placement 08/24/2012    Procedure: ARTERIOVENOUS (AV) FISTULA CREATION;  Surgeon: Larina Earthly, MD;  Location: [facility] OR;  Service: Vascular;  Laterality: Left;    Family History  Problem Relation Age of Onset  . Arthritis    . Cancer    . Kidney disease    . Anesthesia problems Neg Hx   . Hypotension Neg Hx   . Malignant hyperthermia Neg Hx   . Pseudochol deficiency Neg Hx   . Cancer  Sister   . Cancer Brother     colon  . Cancer Brother     colon    Social History:  reports that he quit smoking about 14 years ago. His smoking use included Cigarettes. He has a 30 pack-year smoking history. He quit smokeless tobacco use about 18 years ago. He reports that he does not drink alcohol or use illicit drugs.  Allergies: No Known Allergies  Medications: I have reviewed the patient's current medications.  Results for orders placed during the hospital encounter of 10/08/12 (from the past 48 hour(s))  CBC WITH DIFFERENTIAL     Status: Abnormal   Collection Time   10/08/12  6:57 PM      Component Value Range Comment   WBC 16.3 (*) 4.0 - 10.5 K/uL    RBC 3.20 (*) 4.22 - 5.81 MIL/uL    Hemoglobin 8.9 (*) 13.0 - 17.0 g/dL    HCT 45.4 (*) 09.8 - 52.0 %    MCV 86.9  78.0 - 100.0 fL    MCH 27.8  26.0 - 34.0 pg    MCHC 32.0  30.0 - 36.0 g/dL    RDW 11.9 (*) 14.7 - 15.5 %    Platelets 141 (*) 150 - 400 K/uL    Neutrophils Relative 90 (*) 43 - 77 %    Neutro Abs 14.8 (*) 1.7 -  7.7 K/uL    Lymphocytes Relative 4 (*) 12 - 46 %    Lymphs Abs 0.6 (*) 0.7 - 4.0 K/uL    Monocytes Relative 6  3 - 12 %    Monocytes Absolute 0.9  0.1 - 1.0 K/uL    Eosinophils Relative 0  0 - 5 %    Eosinophils Absolute 0.1  0.0 - 0.7 K/uL    Basophils Relative 0  0 - 1 %    Basophils Absolute 0.0  0.0 - 0.1 K/uL   COMPREHENSIVE METABOLIC PANEL     Status: Abnormal   Collection Time   10/08/12  6:57 PM      Component Value Range Comment   Sodium 138  135 - 145 mEq/L    Potassium 4.3  3.5 - 5.1 mEq/L    Chloride 104  96 - 112 mEq/L    CO2 22  19 - 32 mEq/L    Glucose, Bld 151 (*) 70 - 99 mg/dL    BUN 85 (*) 6 - 23 mg/dL    Creatinine, Ser 1.61 (*) 0.50 - 1.35 mg/dL    Calcium 8.9  8.4 - 09.6 mg/dL    Total Protein 5.8 (*) 6.0 - 8.3 g/dL    Albumin 2.5 (*) 3.5 - 5.2 g/dL    AST 49 (*) 0 - 37 U/L    ALT 42  0 - 53 U/L    Alkaline Phosphatase 72  39 - 117 U/L    Total Bilirubin 0.6  0.3 - 1.2  mg/dL    GFR calc non Af Amer 9 (*) >90 mL/min    GFR calc Af Amer 11 (*) >90 mL/min   PRO B NATRIURETIC PEPTIDE     Status: Abnormal   Collection Time   10/08/12  6:57 PM      Component Value Range Comment   Pro B Natriuretic peptide (BNP) >70000.0 (*) 0 - 450 pg/mL   TROPONIN I     Status: Normal   Collection Time   10/08/12  6:57 PM      Component Value Range Comment   Troponin I <0.30  <0.30 ng/mL   CBC     Status: Abnormal   Collection Time   10/08/12 10:41 PM      Component Value Range Comment   WBC 17.0 (*) 4.0 - 10.5 K/uL    RBC 3.21 (*) 4.22 - 5.81 MIL/uL    Hemoglobin 9.2 (*) 13.0 - 17.0 g/dL    HCT 04.5 (*) 40.9 - 52.0 %    MCV 87.2  78.0 - 100.0 fL    MCH 28.7  26.0 - 34.0 pg    MCHC 32.9  30.0 - 36.0 g/dL    RDW 81.1 (*) 91.4 - 15.5 %    Platelets 126 (*) 150 - 400 K/uL   DIFFERENTIAL     Status: Abnormal   Collection Time   10/08/12 10:41 PM      Component Value Range Comment   Neutrophils Relative 92 (*) 43 - 77 %    Neutro Abs 15.6 (*) 1.7 - 7.7 K/uL    Lymphocytes Relative 5 (*) 12 - 46 %    Lymphs Abs 0.7  0.7 - 4.0 K/uL    Monocytes Relative 3  3 - 12 %    Monocytes Absolute 0.6  0.1 - 1.0 K/uL    Eosinophils Relative 0  0 - 5 %    Eosinophils Absolute 0.0  0.0 - 0.7 K/uL  Basophils Relative 0  0 - 1 %    Basophils Absolute 0.0  0.0 - 0.1 K/uL   TROPONIN I     Status: Normal   Collection Time   10/08/12 10:41 PM      Component Value Range Comment   Troponin I <0.30  <0.30 ng/mL   RETICULOCYTES     Status: Abnormal   Collection Time   10/08/12 10:41 PM      Component Value Range Comment   Retic Ct Pct 1.9  0.4 - 3.1 %    RBC. 3.21 (*) 4.22 - 5.81 MIL/uL    Retic Count, Manual 61.0  19.0 - 186.0 K/uL   MAGNESIUM     Status: Normal   Collection Time   10/08/12 10:44 PM      Component Value Range Comment   Magnesium 1.5  1.5 - 2.5 mg/dL   MRSA PCR SCREENING     Status: Normal   Collection Time   10/09/12 12:39 AM      Component Value Range Comment     MRSA by PCR NEGATIVE  NEGATIVE   TROPONIN I     Status: Normal   Collection Time   10/09/12  6:25 AM      Component Value Range Comment   Troponin I <0.30  <0.30 ng/mL     Dg Chest Port 1 View  10/08/2012  *RADIOLOGY REPORT*  Clinical Data: Shortness of breath, vomiting.  PORTABLE CHEST - 1 VIEW  Comparison: 09/30/2012  Findings: Mild cardiomegaly.  Vascular congestion.  Left lower lobe atelectasis or infiltrate with small left effusion, similar to prior study.  No focal opacity or effusion on the right.  No acute bony abnormality.  IMPRESSION: Continued left lower lobe atelectasis or infiltrate with small left effusion.  Cardiomegaly, vascular congestion.   Original Report Authenticated By: Charlett Nose, M.D.     Review of Systems  Constitutional: Positive for chills.  HENT: Positive for congestion.   Respiratory: Positive for cough and shortness of breath.   Cardiovascular: Positive for PND.  Gastrointestinal: Negative for nausea and vomiting.  Neurological: Positive for weakness.   Blood pressure 150/70, pulse 68, temperature 97.9 F (36.6 C), temperature source Oral, resp. rate 12, height 5\' 9"  (1.753 m), weight 81.4 kg (179 lb 7.3 oz), SpO2 100.00%. Physical Exam  Constitutional: He is oriented to person, place, and time. No distress.  Eyes: Scleral icterus is present.  Neck: JVD present.  Cardiovascular: Normal rate and regular rhythm.  Exam reveals gallop.   No murmur heard. Respiratory: He has rales.  GI: He exhibits no distension. There is no tenderness. There is no rebound.  Musculoskeletal: He exhibits no edema.  Neurological: He is alert and oriented to person, place, and time.    Assessment/Plan: Problem #1 difficulty breathing at this moment seems to be secondary to congestive heart failure. Has only mild pleural effusion but also there is a questionable area of atelectasis versus pneumonia. Problem #2 chronic renal failure BUN and creatinine stable possibly stage  IV to stage V. Patient doesn't have any nausea vomiting. His appetite however is poor. Problem #3 hypertension presently patient is on nitro drip blood pressure seems to be stable Problem #4 history of bilateral pleural effusion status post thoracentesis Problem #5 history of hyperlipidemia Problem #6 history of cardiomyopathy Problem #7 anemia thought to be secondary to chronic renal failure his H&H is stable. Problem #8 metabolic bone disease patient wheeze secondary hyperparathyroidism he is on Rocaltrol. Phosphorus level is  not available today. Plan: We'll start patient on dialysis today he is a fistula which is about 6-7 weeks. We'll try to use that and if we have any problem then we'll put a permanent catheter for dialysis. We'll check his CBC, basic metabolic panel and phosphorus in the morning.  Randy [name] S 10/09/2012, 7:32 AM

## 2012-10-09 NOTE — Progress Notes (Signed)
ANTIBIOTIC CONSULT NOTE - INITIAL  Pharmacy Consult for Vancomycin & Zosyn Indication: rule out pneumonia  No Known Allergies  Patient Measurements: Height: 5\' 9"  (175.3 cm) Weight: 179 lb 7.3 oz (81.4 kg) IBW/kg (Calculated) : 70.7  Vital Signs: Temp: 97.9 F (36.6 C) (01/17 0400) Temp src: Oral (01/17 0400) BP: 150/70 mmHg (01/17 0512) Pulse Rate: 68  (01/17 0500) Intake/Output from previous day: 01/16 0701 - 01/17 0700 In: 620 [IV Piggyback:620] Out: 600 [Urine:600] Intake/Output from this shift: Total I/O In: 600 [P.O.:600] Out: 175 [Urine:175]  Labs:  [facility] 10/09/12 0625 10/08/12 2241 10/08/12 1857  WBC -- 17.0* 16.3*  HGB -- 9.2* 8.9*  PLT -- 126* 141*  LABCREA -- -- --  CREATININE 5.47* -- 5.25*   Estimated Creatinine Clearance: 11.1 ml/min (by C-G formula based on Cr of 5.47). No results found for this basename: VANCOTROUGH:2,VANCOPEAK:2,VANCORANDOM:2,GENTTROUGH:2,GENTPEAK:2,GENTRANDOM:2,TOBRATROUGH:2,TOBRAPEAK:2,TOBRARND:2,AMIKACINPEAK:2,AMIKACINTROU:2,AMIKACIN:2, in the last 72 hours   Microbiology: Recent Results (from the past 720 hour(s))  AFB CULTURE WITH SMEAR     Status: Normal (Preliminary result)   Collection Time   09/25/12 12:00 PM      Component Value Range Status Comment   Specimen Description PLEURAL   Final    Special Requests NONE   Final    ACID FAST SMEAR NO ACID FAST BACILLI SEEN   Final    Culture     Final    Value: CULTURE WILL BE EXAMINED FOR 6 WEEKS BEFORE ISSUING A FINAL REPORT   Report Status PENDING   Incomplete   BODY FLUID CULTURE     Status: Normal   Collection Time   09/25/12 12:00 PM      Component Value Range Status Comment   Specimen Description FLUID PLEURAL   Final    Special Requests NONE   Final    Gram Stain     Final    Value: NO WBC SEEN     NO ORGANISMS SEEN   Culture NO GROWTH 3 DAYS   Final    Report Status 09/29/2012 FINAL   Final   AFB CULTURE WITH SMEAR     Status: Normal (Preliminary result)   Collection Time   09/30/12  1:47 PM      Component Value Range Status Comment   Specimen Description FLUID PLEURAL   Final    Special Requests NONE   Final    ACID FAST SMEAR NO ACID FAST BACILLI SEEN   Final    Culture     Final    Value: CULTURE WILL BE EXAMINED FOR 6 WEEKS BEFORE ISSUING A FINAL REPORT   Report Status PENDING   Incomplete   BODY FLUID CULTURE     Status: Normal   Collection Time   09/30/12  1:47 PM      Component Value Range Status Comment   Specimen Description FLUID PLEURAL   Final    Special Requests NONE   Final    Gram Stain     Final    Value: NO WBC SEEN     NO ORGANISMS SEEN   Culture NO GROWTH 3 DAYS   Final    Report Status 10/04/2012 FINAL   Final   MRSA PCR SCREENING     Status: Normal   Collection Time   10/09/12 12:39 AM      Component Value Range Status Comment   MRSA by PCR NEGATIVE  NEGATIVE Final     Medical History: Past Medical History  Diagnosis Date  . Hypertensive heart  disease   . Hypercholesterolemia   . Gout   . Secondary cardiomyopathy     LVEF 40-45%  . Anemia of chronic disease   . Hypertension   . CHF (congestive heart failure)   . Shortness of breath   . Arthritis   . CKD (chronic kidney disease) stage 4, GFR 15-29 ml/min    Medications:  Scheduled:     . ipratropium  0.5 mg Nebulization Q4H WA   And  . albuterol  2.5 mg Nebulization Q4H WA  . amLODipine  10 mg Oral Daily  . atorvastatin  40 mg Oral q1800  . cloNIDine  0.2 mg Oral BID  . enoxaparin (LOVENOX) injection  30 mg Subcutaneous Q24H  . [COMPLETED] furosemide  160 mg Intravenous Once  . furosemide  200 mg Intravenous Q12H  . [COMPLETED] furosemide  40 mg Intravenous Once  . hydrALAZINE  50 mg Oral Q8H  . isosorbide mononitrate  60 mg Oral Daily  . labetalol  300 mg Oral BID  . piperacillin-tazobactam (ZOSYN)  IV  2.25 g Intravenous Q8H  . [COMPLETED] vancomycin  1,500 mg Intravenous Once  . [DISCONTINUED] furosemide  100 mg Intravenous Q12H  .  [DISCONTINUED] furosemide  1 mg/kg Intravenous Q12H  . [DISCONTINUED] hydrALAZINE  50 mg Oral Q8H  . [DISCONTINUED] labetalol  300 mg Oral BID  . [DISCONTINUED] simvastatin  80 mg Oral q1800   Assessment: Okay for Protocol ESRD to start HD today.  Goal of Therapy:  Pre-Hemodialysis Vancomycin level goal range =15-25 mcg/ml  Plan:  Vancomycin 1000mg  IV every HD. Zosyn 2.25 GM IV every 8 hours. Pre-HD Vancomycin level in 1 week if therapy continues. Monitor renal function, HD tolerance and schedule. Labs per protocol  Mady Gemma 10/09/2012,9:00 AM

## 2012-10-09 NOTE — Progress Notes (Signed)
UR Chart Review Completed  

## 2012-10-10 LAB — BASIC METABOLIC PANEL
BUN: 60 mg/dL — ABNORMAL HIGH (ref 6–23)
Calcium: 8.4 mg/dL (ref 8.4–10.5)
GFR calc non Af Amer: 11 mL/min — ABNORMAL LOW (ref >90)
Glucose, Bld: 115 mg/dL — ABNORMAL HIGH (ref 70–99)
Sodium: 140 mEq/L (ref 135–145)

## 2012-10-10 LAB — HEPATIC FUNCTION PANEL
Albumin: 2.2 g/dL — ABNORMAL LOW (ref 3.5–5.2)
Bilirubin, Direct: 0.1 mg/dL (ref 0.0–0.3)
Total Bilirubin: 0.5 mg/dL (ref 0.3–1.2)

## 2012-10-10 LAB — CBC
Platelets: 127 10*3/uL — ABNORMAL LOW (ref 150–400)
RBC: 2.88 MIL/uL — ABNORMAL LOW (ref 4.22–5.81)
WBC: 17.3 10*3/uL — ABNORMAL HIGH (ref 4.0–10.5)

## 2012-10-10 MED ORDER — HEPARIN SODIUM (PORCINE) 1000 UNIT/ML DIALYSIS
300.0000 [IU] | INTRAMUSCULAR | Status: DC | PRN
  Filled 2012-10-10: qty 1

## 2012-10-10 MED ORDER — EPOETIN ALFA 10000 UNIT/ML IJ SOLN
10000.0000 [IU] | INTRAMUSCULAR | Status: DC
  Administered 2012-10-10: 10000 [IU] via INTRAVENOUS
  Filled 2012-10-10: qty 1

## 2012-10-10 MED ORDER — ALTEPLASE 2 MG IJ SOLR
2.0000 mg | Freq: Once | INTRAMUSCULAR | Status: AC | PRN
  Filled 2012-10-10: qty 2

## 2012-10-10 MED ORDER — VANCOMYCIN HCL IN DEXTROSE 1-5 GM/200ML-% IV SOLN
1000.0000 mg | INTRAVENOUS | Status: DC
  Administered 2012-10-10: 1000 mg via INTRAVENOUS
  Filled 2012-10-10: qty 200

## 2012-10-10 MED ORDER — BISACODYL 10 MG RE SUPP
10.0000 mg | Freq: Every day | RECTAL | Status: DC | PRN
  Administered 2012-10-10: 10 mg via RECTAL
  Filled 2012-10-10: qty 1

## 2012-10-10 MED ORDER — PENTAFLUOROPROP-TETRAFLUOROETH EX AERO
1.0000 "application " | INHALATION_SPRAY | CUTANEOUS | Status: DC | PRN
  Filled 2012-10-10: qty 103.5

## 2012-10-10 MED ORDER — SODIUM CHLORIDE 0.9 % IV SOLN: 100.0000 mL | INTRAVENOUS | Status: DC | PRN

## 2012-10-10 NOTE — Procedures (Signed)
First ever hemodialysis treatment completed through right IJ catheter.  Tolerated removal of 1.5 L over 2 hours with no interruption in ultrafiltration.  All blood was reinfused.

## 2012-10-10 NOTE — Progress Notes (Signed)
[id number] 

## 2012-10-10 NOTE — Progress Notes (Signed)
Report to Raimondi. Ran 3.5 hours. Pulled 3000 ml net.

## 2012-10-10 NOTE — Progress Notes (Signed)
Subjective: Interval History: has no complaint of poor appetite. He doesn't have any nausea vomiting. Complaints of weakness..  Objective: Vital signs in last 24 hours: Temp:  [98.1 F (36.7 C)-99.6 F (37.6 C)] 98.9 F (37.2 C) (01/18 0400) Pulse Rate:  [73-91] 85  (01/18 0700) Resp:  [12-23] 17  (01/18 0500) BP: (116-177)/(56-96) 154/70 mmHg (01/18 0700) SpO2:  [91 %-100 %] 96 % (01/18 0700) Weight:  [83.2 kg (183 lb 6.8 oz)] 83.2 kg (183 lb 6.8 oz) (01/18 0500) Weight change: 1.8 kg (3 lb 15.5 oz)  Intake/Output from previous day: 01/17 0701 - 01/18 0700 In: 1720 [P.O.:1230; IV Piggyback:490] Out: 2800 [Urine:1200] Intake/Output this shift:    General appearance: alert, cooperative and no distress Resp: diminished breath sounds posterior - bilateral Cardio: regular rate and rhythm, S1, S2 normal, no murmur, click, rub or gallop GI: soft, non-tender; bowel sounds normal; no masses,  no organomegaly Extremities: extremities normal, atraumatic, no cyanosis or edema  Lab Results:  Basename 10/10/12 0459 10/08/12 2241  WBC 17.3* 17.0*  HGB 8.1* 9.2*  HCT 25.0* 28.0*  PLT 127* 126*   BMET:  Basename 10/10/12 0459 10/09/12 0625  NA 140 138  K 3.7 3.9  CL 104 103  CO2 25 24  GLUCOSE 115* 124*  BUN 60* 86*  CREATININE 4.54* 5.47*  CALCIUM 8.4 8.7   No results found for this basename: PTH:2 in the last 72 hours Iron Studies: No results found for this basename: IRON,TIBC,TRANSFERRIN,FERRITIN in the last 72 hours  Studies/Results: Ir Fluoro Guide Cv Line Right  10/09/2012  *RADIOLOGY REPORT*  PLACEMENT OF A TEMPORARY CENTRAL VENOUS CATHETER FOR HEMODIALYSIS USING ULTRASOUND AND FLUOROSCOPIC GUIDANCE  Date: 10/09/2012  Clinical History: 79 year old male with progressive chronic kidney disease now in need of urgent hemodialysis.  He currently has a new leukocytosis as well as findings concerning for possible pneumonia and chest x-ray.  Therefore, we will place a temporary  hemodialysis catheter so the patient may begin dialysis.  After resolution of the acute infectious process the catheter can be converted to a tunneled hemodialysis catheter which can be used while his upper extremity AV fistula matures.  Procedures Performed: 1. Ultrasound-guided puncture the right internal jugular vein 2.  Placement of a non tunneled, temporary hemodialysis catheter under fluoroscopic guidance  Interventional Radiologist:  Sterling Big, MD  Fluoroscopy time: 0.3 minutes  Contrast volume: None  PROCEDURE/FINDINGS:   Informed consent was obtained from the patient following explanation of the procedure, risks, benefits and alternatives. The patient understands, agrees and consents for the procedure. All questions were addressed. A time out was performed.  Maximal barrier sterile technique utilized including caps, mask, sterile gowns, sterile gloves, large sterile drape, hand hygiene, and chlorhexidine skin prep.  The right neck was interrogated with ultrasound in the right internal jugular vein found to be widely patent.  Local anesthesia was achieved by infiltration of 1% lidocaine.  Under direct sonographic guidance, this was punctured with a 21-gauge micropuncture needle.  An image was obtained and stored.  A wire was advanced into the inferior vena cava.  The skin tract was then serially dilated and a 20 cm Trialysis catheter was advanced.  The tip was positioned in the upper right atrium under fluoroscopy. The catheter flushed and aspirated with ease.  Was flushed with saline and locked with 1000 unit/ml heparinized saline.  The catheter was secured to the skin with O Prolene suture.  A sterile bandage was placed.  There is no immediate  complication, the patient tolerated the procedure well.  IMPRESSION:  Technically successful placement of a 20 cm try dialysis temporary hemodialysis catheter via the right internal jugular vein.  The catheter tip is in the upper right atrium ready for  immediate use.  After resolution of the patient's likely acute infectious process, this catheter can be converted to a tunneled hemodialysis catheter.  Signed,  Sterling Big, MD Vascular & Interventional Radiologist [facility] Radiology   Original Report Authenticated By: Malachy Moan, M.D.    Ir US Guide Vasc Access Right  10/09/2012  *RADIOLOGY REPORT*  PLACEMENT OF A TEMPORARY CENTRAL VENOUS CATHETER FOR HEMODIALYSIS USING ULTRASOUND AND FLUOROSCOPIC GUIDANCE  Date: 10/09/2012  Clinical History: 79 year old male with progressive chronic kidney disease now in need of urgent hemodialysis.  He currently has a new leukocytosis as well as findings concerning for possible pneumonia and chest x-ray.  Therefore, we will place a temporary hemodialysis catheter so the patient may begin dialysis.  After resolution of the acute infectious process the catheter can be converted to a tunneled hemodialysis catheter which can be used while his upper extremity AV fistula matures.  Procedures Performed: 1. Ultrasound-guided puncture the right internal jugular vein 2.  Placement of a non tunneled, temporary hemodialysis catheter under fluoroscopic guidance  Interventional Radiologist:  Sterling Big, MD  Fluoroscopy time: 0.3 minutes  Contrast volume: None  PROCEDURE/FINDINGS:   Informed consent was obtained from the patient following explanation of the procedure, risks, benefits and alternatives. The patient understands, agrees and consents for the procedure. All questions were addressed. A time out was performed.  Maximal barrier sterile technique utilized including caps, mask, sterile gowns, sterile gloves, large sterile drape, hand hygiene, and chlorhexidine skin prep.  The right neck was interrogated with ultrasound in the right internal jugular vein found to be widely patent.  Local anesthesia was achieved by infiltration of 1% lidocaine.  Under direct sonographic guidance, this was punctured with a  21-gauge micropuncture needle.  An image was obtained and stored.  A wire was advanced into the inferior vena cava.  The skin tract was then serially dilated and a 20 cm Trialysis catheter was advanced.  The tip was positioned in the upper right atrium under fluoroscopy. The catheter flushed and aspirated with ease.  Was flushed with saline and locked with 1000 unit/ml heparinized saline.  The catheter was secured to the skin with O Prolene suture.  A sterile bandage was placed.  There is no immediate complication, the patient tolerated the procedure well.  IMPRESSION:  Technically successful placement of a 20 cm try dialysis temporary hemodialysis catheter via the right internal jugular vein.  The catheter tip is in the upper right atrium ready for immediate use.  After resolution of the patient's likely acute infectious process, this catheter can be converted to a tunneled hemodialysis catheter.  Signed,  Sterling Big, MD Vascular & Interventional Radiologist [facility] Radiology   Original Report Authenticated By: Malachy Moan, M.D.    Dg Chest Port 1 View  10/08/2012  *RADIOLOGY REPORT*  Clinical Data: Shortness of breath, vomiting.  PORTABLE CHEST - 1 VIEW  Comparison: 09/30/2012  Findings: Mild cardiomegaly.  Vascular congestion.  Left lower lobe atelectasis or infiltrate with small left effusion, similar to prior study.  No focal opacity or effusion on the right.  No acute bony abnormality.  IMPRESSION: Continued left lower lobe atelectasis or infiltrate with small left effusion.  Cardiomegaly, vascular congestion.   Original Report Authenticated By: Charlett Nose,  M.D.     I have reviewed the patient's current medications.  Assessment/Plan: Problem #1 renal failure chronic patient started on hemodialysis his BUN and creatinine is in acceptable range. Patient is still has poor appetite otherwise no new complaints.  Problem #2 hypertension her pressure seems reasonably controlled Problem #3  history of difficulty in in breathing combination of CHF and pneumonia presently seems to be feeling better. Problem #4 anemia most likely secondary to chronic renal failure. Problem #5 history of pneumonia patient on antibiotics his a febrile however his white blood cell count is high. Problem #6 osteoarthritis Problem #7 peripheral vascular disease Plan: Will dialyze patient today for 3 and half hours We'll try to get about 3 L if his blood pressure tolerates. We'll start him on Epogen 10,000 units IV after each dialysis. We'll continue his other medications as before.   LOS: 2 days   Nakaila Freeze S 10/10/2012,7:57 AM

## 2012-10-10 NOTE — Progress Notes (Signed)
DUCOLAX SUPPOSITORY HELD UNTIL PT FINISHES DIALYSIS. DIALYSIS NURSE IN ROOM SETTING UP NOW.

## 2012-10-10 NOTE — Progress Notes (Signed)
Randy [name], NAREZ NO.:  [id number]  MEDICAL RECORD NO.:  [id number]  LOCATION:  IC07                          FACILITY:  [facility]  PHYSICIAN:  Melvyn Novas, MDDATE OF BIRTH:  1931-12-07  DATE OF PROCEDURE: DATE OF DISCHARGE:                                PROGRESS NOTE   The patient admitted.  Blood pressure somewhat improved to 150/70, sinus rhythm, temperature 98.2, pulse 68 and regular, respiratory rate is 12, hemoglobin 9.7, WBC 17, potassium 3.9, creatinine of 5.4.  Chest x-ray reveals left lower lobe atelectasis or possible infiltrate, small left effusion, significant vascular congestion.  PHYSICAL EXAMINATION:  GENERAL:  The patient alert and oriented. LUNGS:  Diminished breath sounds at the bases.  Prolonged expiratory phase.  No rales, wheeze, or rhonchi appreciable. HEART:  Regular rhythm.  No S3 auscultated, S4 positive.  No heaves, thrills, or rubs. EXTREMITIES:  Pedal edema 2+.  IMPRESSION:  Volume overload due to renal failure.  Shunt was not able to be used today for dialysis.  We will transfer to [facility] temporarily for intravenous dialysis catheter to be placed and dialysis at our earliest convenience.  We will monitor renal function daily.  He is currently empirically placed on vanc and Zosyn.  My clinical impression is not leaning strongly towards pneumonia, rather residual atelectasis from previous pneumonia.  However, we will leave antibiotics in place as there will be new catheter placement today hopefully and we will not elect to jeopardize this.     Melvyn Novas, MD     RMD/MEDQ  D:  10/09/2012  T:  10/10/2012  Job:  [id number]

## 2012-10-11 LAB — CBC
HCT: 26 % — ABNORMAL LOW (ref 39.0–52.0)
Hemoglobin: 8.3 g/dL — ABNORMAL LOW (ref 13.0–17.0)
MCH: 27.8 pg (ref 26.0–34.0)
MCHC: 31.9 g/dL (ref 30.0–36.0)
MCV: 87 fL (ref 78.0–100.0)
Platelets: 135 10*3/uL — ABNORMAL LOW (ref 150–400)
RBC: 2.99 MIL/uL — ABNORMAL LOW (ref 4.22–5.81)
RDW: 17.5 % — ABNORMAL HIGH (ref 11.5–15.5)
WBC: 14.7 10*3/uL — ABNORMAL HIGH (ref 4.0–10.5)

## 2012-10-11 LAB — BASIC METABOLIC PANEL
BUN: 29 mg/dL — ABNORMAL HIGH (ref 6–23)
CO2: 30 mEq/L (ref 19–32)
Calcium: 8.4 mg/dL (ref 8.4–10.5)
Chloride: 100 mEq/L (ref 96–112)
Creatinine, Ser: 3.14 mg/dL — ABNORMAL HIGH (ref 0.50–1.35)
GFR calc Af Amer: 20 mL/min — ABNORMAL LOW (ref >90)
GFR calc non Af Amer: 18 mL/min — ABNORMAL LOW (ref >90)
Glucose, Bld: 130 mg/dL — ABNORMAL HIGH (ref 70–99)
Potassium: 3.4 mEq/L — ABNORMAL LOW (ref 3.5–5.1)
Sodium: 137 mEq/L (ref 135–145)

## 2012-10-11 LAB — HEPATIC FUNCTION PANEL
ALT: 32 U/L (ref 0–53)
AST: 16 U/L (ref 0–37)
Albumin: 2.1 g/dL — ABNORMAL LOW (ref 3.5–5.2)
Alkaline Phosphatase: 57 U/L (ref 39–117)
Bilirubin, Direct: 0.2 mg/dL (ref 0.0–0.3)
Indirect Bilirubin: 0.3 mg/dL (ref 0.3–0.9)
Total Bilirubin: 0.5 mg/dL (ref 0.3–1.2)
Total Protein: 5.4 g/dL — ABNORMAL LOW (ref 6.0–8.3)

## 2012-10-11 MED ORDER — HEPARIN SODIUM (PORCINE) 1000 UNIT/ML DIALYSIS: 300.0000 [IU] | INTRAMUSCULAR | Status: DC | PRN

## 2012-10-11 MED ORDER — PIPERACILLIN-TAZOBACTAM IN DEX 2-0.25 GM/50ML IV SOLN
INTRAVENOUS | Status: AC
  Filled 2012-10-11: qty 50

## 2012-10-11 MED ORDER — PENTAFLUOROPROP-TETRAFLUOROETH EX AERO
1.0000 "application " | INHALATION_SPRAY | CUTANEOUS | Status: DC | PRN
  Filled 2012-10-11: qty 103.5

## 2012-10-11 MED ORDER — DOXERCALCIFEROL 4 MCG/2ML IV SOLN
2.0000 ug | INTRAVENOUS | Status: DC
  Administered 2012-10-12: 2 ug via INTRAVENOUS
  Filled 2012-10-11 (×2): qty 2

## 2012-10-11 MED ORDER — HEPARIN SODIUM (PORCINE) 1000 UNIT/ML DIALYSIS: 20.0000 [IU]/kg | INTRAMUSCULAR | Status: DC | PRN

## 2012-10-11 MED ORDER — LIDOCAINE-PRILOCAINE 2.5-2.5 % EX CREA
1.0000 "application " | TOPICAL_CREAM | CUTANEOUS | Status: DC | PRN
  Filled 2012-10-11: qty 5

## 2012-10-11 MED ORDER — ALTEPLASE 2 MG IJ SOLR
2.0000 mg | Freq: Once | INTRAMUSCULAR | Status: AC | PRN
  Filled 2012-10-11: qty 2

## 2012-10-11 MED ORDER — HEPARIN SODIUM (PORCINE) 1000 UNIT/ML DIALYSIS
1000.0000 [IU] | INTRAMUSCULAR | Status: DC | PRN
  Filled 2012-10-11: qty 1

## 2012-10-11 MED ORDER — LIDOCAINE HCL (PF) 1 % IJ SOLN: 5.0000 mL | INTRAMUSCULAR | Status: DC | PRN

## 2012-10-11 MED ORDER — VANCOMYCIN HCL IN DEXTROSE 1-5 GM/200ML-% IV SOLN
1000.0000 mg | INTRAVENOUS | Status: DC
  Administered 2012-10-12: 1000 mg via INTRAVENOUS
  Filled 2012-10-11 (×2): qty 200

## 2012-10-11 NOTE — Progress Notes (Signed)
PT TRANSFERRED TO [id number]. VERBAL TRANSFER REPORT GIVEN TO MEAGAN FROM 300. ALERT AND ORIENTED.LT AVF HAS THRIL AND BRUII. RT HAND IV PATENT. VVS. MILD SCATTERED RHONHI .CONTINUES TO VOID CLEAR YELLOW URINE.

## 2012-10-11 NOTE — Progress Notes (Signed)
NAMEMYLIK, PRO NO.:  [id number]  MEDICAL RECORD NO.:  [id number]  LOCATION:  IC07                          FACILITY:  [facility]  PHYSICIAN:  Melvyn Novas, MDDATE OF BIRTH:  10/29/1931  DATE OF PROCEDURE: DATE OF DISCHARGE:                                PROGRESS NOTE   The patient had temporary vascular catheter, triple-lumen for dialysis. Underwent dialysis last night, has less dyspnea as predicted and we will have repeat dialysis today.  Creatinine is down to 4.5.  Hemoglobin 8.0, WBC 17.  The patient has left lower lobe atelectasis or infiltrate, on vancomycin and Zosyn, and doing well.  Lungs showed no rales, wheeze, or rhonchi appreciable with prolonged expiratory phase.  Heart; regular rhythm.  No S3 or S4 are auscultated.  No heaves, thrills, or rubs. Blood pressure 155/72, pulse is 82 and regular, respiratory rate is 18.  The patient appeared disappeared.  I told him to focus on getting well and that he will feel much better after several rounds of dialysis.  He seems to understand this.  He will get up and ambulate today.     Melvyn Novas, MD     RMD/MEDQ  D:  10/10/2012  T:  10/11/2012  Job:  [id number]

## 2012-10-11 NOTE — Progress Notes (Signed)
NAMEKHAMBREL, AMSDEN NO.:  [id number]  MEDICAL RECORD NO.:  [id number]  LOCATION:  IC07                          FACILITY:  [facility]  PHYSICIAN:  Melvyn Novas, MDDATE OF BIRTH:  1931-12-31  DATE OF PROCEDURE: DATE OF DISCHARGE:                                PROGRESS NOTE   The patient received dialysis for the 2nd time yesterday, hemodynamically much improved, less dyspnea.  Blood pressure 142/74, temperature 99.9, pulse 79 and regular, respiratory rate is 20.  White count down to 14.7, hemoglobin 8.3, creatinine down to 3.14.  The patient currently on vancomycin and Zosyn for left lower lobe atelectasis/questionable infiltrate, which may be residual from previous infection.  Lungs show diminished breath sounds at bases.  Scattered rhonchi.  No rales appreciable.  Heart regular rhythm.  No S3, S4, auscultated.  The patient currently controlled on multiple antihypertensive and antihyperlipidemics.  Continue current therapy and perhaps 1 more episode of dialysis and we will consider discharge in a day or 2 when the patient is hemodynamically stable.     Melvyn Novas, MD     RMD/MEDQ  D:  10/11/2012  T:  10/11/2012  Job:  [id number]

## 2012-10-11 NOTE — Progress Notes (Signed)
Subjective: Interval History: He denies any nausea or vomiting. Appetite is still poor. His difficulty has improved and he doesn't have any orthopnea or paroxysmal nocturnal dyspnea. of breathing  Objective: Vital signs in last 24 hours: Temp:  [98 F (36.7 C)-99.9 F (37.7 C)] 99.9 F (37.7 C) (01/19 0400) Pulse Rate:  [73-97] 76  (01/19 0600) Resp:  [13-21] 15  (01/19 0600) BP: (122-158)/(61-87) 137/67 mmHg (01/19 0600) SpO2:  [93 %-100 %] 98 % (01/19 0829) Weight:  [80 kg (176 lb 5.9 oz)] 80 kg (176 lb 5.9 oz) (01/19 0500) Weight change: -3.2 kg (-7 lb 0.9 oz)  Intake/Output from previous day: 01/18 0701 - 01/19 0700 In: 1610 [P.O.:1080; I.V.:230; IV Piggyback:300] Out: 3800 [Urine:800] Intake/Output this shift:    General appearance: alert, cooperative and no distress Resp: clear to auscultation bilaterally Cardio: regular rate and rhythm, S1, S2 normal, no murmur, click, rub or gallop GI: soft, non-tender; bowel sounds normal; no masses,  no organomegaly Extremities: extremities normal, atraumatic, no cyanosis or edema  Lab Results:  [facility] 10/11/12 0458 10/10/12 0459  WBC 14.7* 17.3*  HGB 8.3* 8.1*  HCT 26.0* 25.0*  PLT 135* 127*   BMET:  Basename 10/11/12 0458 10/10/12 0459  NA 137 140  K 3.4* 3.7  CL 100 104  CO2 30 25  GLUCOSE 130* 115*  BUN 29* 60*  CREATININE 3.14* 4.54*  CALCIUM 8.4 8.4   No results found for this basename: PTH:2 in the last 72 hours Iron Studies: No results found for this basename: IRON,TIBC,TRANSFERRIN,FERRITIN in the last 72 hours  Studies/Results: Ir Fluoro Guide Cv Line Right  10/09/2012  *RADIOLOGY REPORT*  PLACEMENT OF A TEMPORARY CENTRAL VENOUS CATHETER FOR HEMODIALYSIS USING ULTRASOUND AND FLUOROSCOPIC GUIDANCE  Date: 10/09/2012  Clinical History: 79 year old male with progressive chronic kidney disease now in need of urgent hemodialysis.  He currently has a new leukocytosis as well as findings concerning for possible  pneumonia and chest x-ray.  Therefore, we will place a temporary hemodialysis catheter so the patient may begin dialysis.  After resolution of the acute infectious process the catheter can be converted to a tunneled hemodialysis catheter which can be used while his upper extremity AV fistula matures.  Procedures Performed: 1. Ultrasound-guided puncture the right internal jugular vein 2.  Placement of a non tunneled, temporary hemodialysis catheter under fluoroscopic guidance  Interventional Radiologist:  Sterling Big, MD  Fluoroscopy time: 0.3 minutes  Contrast volume: None  PROCEDURE/FINDINGS:   Informed consent was obtained from the patient following explanation of the procedure, risks, benefits and alternatives. The patient understands, agrees and consents for the procedure. All questions were addressed. A time out was performed.  Maximal barrier sterile technique utilized including caps, mask, sterile gowns, sterile gloves, large sterile drape, hand hygiene, and chlorhexidine skin prep.  The right neck was interrogated with ultrasound in the right internal jugular vein found to be widely patent.  Local anesthesia was achieved by infiltration of 1% lidocaine.  Under direct sonographic guidance, this was punctured with a 21-gauge micropuncture needle.  An image was obtained and stored.  A wire was advanced into the inferior vena cava.  The skin tract was then serially dilated and a 20 cm Trialysis catheter was advanced.  The tip was positioned in the upper right atrium under fluoroscopy. The catheter flushed and aspirated with ease.  Was flushed with saline and locked with 1000 unit/ml heparinized saline.  The catheter was secured to the skin with O Prolene suture.  A sterile bandage was placed.  There is no immediate complication, the patient tolerated the procedure well.  IMPRESSION:  Technically successful placement of a 20 cm try dialysis temporary hemodialysis catheter via the right internal jugular  vein.  The catheter tip is in the upper right atrium ready for immediate use.  After resolution of the patient's likely acute infectious process, this catheter can be converted to a tunneled hemodialysis catheter.  Signed,  Sterling Big, MD Vascular & Interventional Radiologist [facility] Radiology   Original Report Authenticated By: Malachy Moan, M.D.    Ir US Guide Vasc Access Right  10/09/2012  *RADIOLOGY REPORT*  PLACEMENT OF A TEMPORARY CENTRAL VENOUS CATHETER FOR HEMODIALYSIS USING ULTRASOUND AND FLUOROSCOPIC GUIDANCE  Date: 10/09/2012  Clinical History: 79 year old male with progressive chronic kidney disease now in need of urgent hemodialysis.  He currently has a new leukocytosis as well as findings concerning for possible pneumonia and chest x-ray.  Therefore, we will place a temporary hemodialysis catheter so the patient may begin dialysis.  After resolution of the acute infectious process the catheter can be converted to a tunneled hemodialysis catheter which can be used while his upper extremity AV fistula matures.  Procedures Performed: 1. Ultrasound-guided puncture the right internal jugular vein 2.  Placement of a non tunneled, temporary hemodialysis catheter under fluoroscopic guidance  Interventional Radiologist:  Sterling Big, MD  Fluoroscopy time: 0.3 minutes  Contrast volume: None  PROCEDURE/FINDINGS:   Informed consent was obtained from the patient following explanation of the procedure, risks, benefits and alternatives. The patient understands, agrees and consents for the procedure. All questions were addressed. A time out was performed.  Maximal barrier sterile technique utilized including caps, mask, sterile gowns, sterile gloves, large sterile drape, hand hygiene, and chlorhexidine skin prep.  The right neck was interrogated with ultrasound in the right internal jugular vein found to be widely patent.  Local anesthesia was achieved by infiltration of 1% lidocaine.  Under  direct sonographic guidance, this was punctured with a 21-gauge micropuncture needle.  An image was obtained and stored.  A wire was advanced into the inferior vena cava.  The skin tract was then serially dilated and a 20 cm Trialysis catheter was advanced.  The tip was positioned in the upper right atrium under fluoroscopy. The catheter flushed and aspirated with ease.  Was flushed with saline and locked with 1000 unit/ml heparinized saline.  The catheter was secured to the skin with O Prolene suture.  A sterile bandage was placed.  There is no immediate complication, the patient tolerated the procedure well.  IMPRESSION:  Technically successful placement of a 20 cm try dialysis temporary hemodialysis catheter via the right internal jugular vein.  The catheter tip is in the upper right atrium ready for immediate use.  After resolution of the patient's likely acute infectious process, this catheter can be converted to a tunneled hemodialysis catheter.  Signed,  Sterling Big, MD Vascular & Interventional Radiologist [facility] Radiology   Original Report Authenticated By: Malachy Moan, M.D.     I have reviewed the patient's current medications.  Assessment/Plan: Problem #1 chronic renal failure is status post hemodialysis yesterday his pending creatinine was in acceptable range. Patient does not have any uremic symptoms except possibly decreased appetite. Problem #2 difficulty breathing most likely compression of CHF and pneumonia presently patient seems to be feeling better. Problem #3 hypertension blood pressure is reasonably controlled Problem #4 history of pneumonia he is on antibiotics patient is a febrile  his white blood cell count is improving. Problem #5 anemia his hemoglobin and hematocrit is stable patient is started on Epogen Problem #6 metabolic bone disease his calcium and phosphorus is was in acceptable range. However his PTH is high. Problem #7 hypercholesterolemia. Plan: We'll  start patient on a Hectrol 2 mcg IV after each dialysis We'll make arrangements for patient to get dialysis tomorrow We'll continue with Epogen and will check his basic metabolic panel and CBC in the morning.    LOS: 3 days   Letizia Hook S 10/11/2012,9:27 AM

## 2012-10-11 NOTE — Progress Notes (Signed)
[id number] 

## 2012-10-12 LAB — CBC
Platelets: 119 10*3/uL — ABNORMAL LOW (ref 150–400)
RBC: 2.75 MIL/uL — ABNORMAL LOW (ref 4.22–5.81)
RDW: 17.7 % — ABNORMAL HIGH (ref 11.5–15.5)
WBC: 10.6 10*3/uL — ABNORMAL HIGH (ref 4.0–10.5)

## 2012-10-12 LAB — VANCOMYCIN, RANDOM: Vancomycin Rm: 20.6 ug/mL

## 2012-10-12 LAB — BASIC METABOLIC PANEL
CO2: 29 mEq/L (ref 19–32)
Chloride: 101 mEq/L (ref 96–112)
GFR calc Af Amer: 14 mL/min — ABNORMAL LOW (ref >90)
Sodium: 139 mEq/L (ref 135–145)

## 2012-10-12 LAB — PHOSPHORUS: Phosphorus: 3.6 mg/dL (ref 2.3–4.6)

## 2012-10-12 MED ORDER — EPOETIN ALFA 10000 UNIT/ML IJ SOLN
10000.0000 [IU] | INTRAMUSCULAR | Status: DC
  Administered 2012-10-12: 10000 [IU] via INTRAVENOUS
  Filled 2012-10-12 (×3): qty 1

## 2012-10-12 MED FILL — Heparin Sodium (Porcine) Inj 1000 Unit/ML: INTRAMUSCULAR | Qty: 10 | Status: AC

## 2012-10-12 NOTE — Clinical Social Work Note (Signed)
Pt is not stable for d/c today. Will fax insurance card and note regarding PPD reading tomorrow. Dana at [facility] notified and agreeable.  Derenda Fennel, [place] [phone]

## 2012-10-12 NOTE — Clinical Social Work Psychosocial (Signed)
Clinical Social Work Department BRIEF PSYCHOSOCIAL ASSESSMENT 10/12/2012  Patient:  Randy [name], Randy [name]     Account Number:  [id number]     Admit date:  10/08/2012  Clinical Social Worker:  Nancie Neas  Date/Time:  10/12/2012 10:30 AM  Referred by:  Care Management  Date Referred:  10/12/2012 Referred for  Other - See comment   Other Referral:   dialysis   Interview type:  Patient Other interview type:    PSYCHOSOCIAL DATA Living Status:  WIFE Admitted from facility:   Level of care:   Primary support name:  Bernice Primary support relationship to patient:  SPOUSE Degree of support available:   supportive per pt    CURRENT CONCERNS Current Concerns  Other - See comment   Other Concerns:   outpatient dialysis    SOCIAL WORK ASSESSMENT / PLAN CSW met with pt at bedside. Pt alert and oriented and reports he lives with his wife who is supportive. Pt admitted end of last week and started on dialysis. Will need outpatient dialysis arranged. Pt reports Dr. Kristian Covey has discussed this with him and pt would like to go to [facility] in [place]. Referral faxed to [facility] there. Pt states he has transportation to and from dialysis treatment. Awaiting return call from [facility] to set up appointments. CSW will follow up with pt with information.   Assessment/plan status:  Psychosocial Support/Ongoing Assessment of Needs Other assessment/ plan:   Information/referral to community resources:   [place]    PATIENT'S/FAMILY'S RESPONSE TO PLAN OF CARE: Pt reports no other needs at this time and appreciative of CSW assistance.        Derenda Fennel, [place] [phone]

## 2012-10-12 NOTE — Progress Notes (Signed)
ANTIBIOTIC CONSULT NOTE   Pharmacy Consult for Vancomycin & Zosyn Indication: rule out pneumonia  No Known Allergies  Patient Measurements: Height: 5\' 9"  (175.3 cm) Weight: 173 lb 15.1 oz (78.9 kg) IBW/kg (Calculated) : 70.7  Vital Signs: Temp: 98.4 F (36.9 C) (01/20 0539) Temp src: Oral (01/20 0539) BP: 168/77 mmHg (01/20 0539) Pulse Rate: 84  (01/20 0539) Intake/Output from previous day: 01/19 0701 - 01/20 0700 In: 740 [P.O.:600; I.V.:40; IV Piggyback:100] Out: 600 [Urine:600] Intake/Output from this shift:    Labs:  Basename 10/12/12 0409 10/11/12 0458 10/10/12 0459  WBC 10.6* 14.7* 17.3*  HGB 7.8* 8.3* 8.1*  PLT 119* 135* 127*  LABCREA -- -- --  CREATININE 4.34* 3.14* 4.54*   Estimated Creatinine Clearance: 14 ml/min (by C-G formula based on Cr of 4.34).  Basename 10/12/12 0409  VANCOTROUGH --  Leodis Binet --  VANCORANDOM 20.6  GENTTROUGH --  GENTPEAK --  GENTRANDOM --  TOBRATROUGH --  TOBRAPEAK --  TOBRARND --  AMIKACINPEAK --  AMIKACINTROU --  AMIKACIN --     Microbiology: Recent Results (from the past 720 hour(s))  AFB CULTURE WITH SMEAR     Status: Normal (Preliminary result)   Collection Time   09/25/12 12:00 PM      Component Value Range Status Comment   Specimen Description PLEURAL   Final    Special Requests NONE   Final    ACID FAST SMEAR NO ACID FAST BACILLI SEEN   Final    Culture     Final    Value: CULTURE WILL BE EXAMINED FOR 6 WEEKS BEFORE ISSUING A FINAL REPORT   Report Status PENDING   Incomplete   BODY FLUID CULTURE     Status: Normal   Collection Time   09/25/12 12:00 PM      Component Value Range Status Comment   Specimen Description FLUID PLEURAL   Final    Special Requests NONE   Final    Gram Stain     Final    Value: NO WBC SEEN     NO ORGANISMS SEEN   Culture NO GROWTH 3 DAYS   Final    Report Status 09/29/2012 FINAL   Final   AFB CULTURE WITH SMEAR     Status: Normal (Preliminary result)   Collection Time   09/30/12   1:47 PM      Component Value Range Status Comment   Specimen Description FLUID PLEURAL   Final    Special Requests NONE   Final    ACID FAST SMEAR NO ACID FAST BACILLI SEEN   Final    Culture     Final    Value: CULTURE WILL BE EXAMINED FOR 6 WEEKS BEFORE ISSUING A FINAL REPORT   Report Status PENDING   Incomplete   BODY FLUID CULTURE     Status: Normal   Collection Time   09/30/12  1:47 PM      Component Value Range Status Comment   Specimen Description FLUID PLEURAL   Final    Special Requests NONE   Final    Gram Stain     Final    Value: NO WBC SEEN     NO ORGANISMS SEEN   Culture NO GROWTH 3 DAYS   Final    Report Status 10/04/2012 FINAL   Final   MRSA PCR SCREENING     Status: Normal   Collection Time   10/09/12 12:39 AM      Component Value Range Status  Comment   MRSA by PCR NEGATIVE  NEGATIVE Final    Medical History: Past Medical History  Diagnosis Date  . Hypertensive heart disease   . Hypercholesterolemia   . Gout   . Secondary cardiomyopathy     LVEF 40-45%  . Anemia of chronic disease   . Hypertension   . CHF (congestive heart failure)   . Shortness of breath   . Arthritis   . CKD (chronic kidney disease) stage 4, GFR 15-29 ml/min    Medications:  Scheduled:     . ipratropium  0.5 mg Nebulization Q4H WA   And  . albuterol  2.5 mg Nebulization Q4H WA  . amLODipine  10 mg Oral Daily  . atorvastatin  40 mg Oral q1800  . cloNIDine  0.2 mg Oral BID  . doxercalciferol  2 mcg Intravenous Q M,W,F-HD  . enoxaparin (LOVENOX) injection  30 mg Subcutaneous Q24H  . epoetin alfa  10,000 Units Intravenous Q T,Th,Sa-HD  . hydrALAZINE  50 mg Oral Q8H  . isosorbide mononitrate  60 mg Oral Daily  . labetalol  300 mg Oral BID  . piperacillin-tazobactam (ZOSYN)  IV  2.25 g Intravenous Q8H  . vancomycin  1,000 mg Intravenous Q M,W,F-HD  . [DISCONTINUED] furosemide  200 mg Intravenous Q12H   Assessment: Okay for Protocol ESRD to start HD today. Vancomycin level on  target  Goal of Therapy:  Pre-Hemodialysis Vancomycin level goal range =15-25 mcg/ml  Plan:  Vancomycin 1000mg  IV every HD. Zosyn 2.25 GM IV every 8 hours. Pre-HD Vancomycin level in 1 week if therapy continues. Monitor renal function, HD tolerance and schedule. Labs per protocol  Valrie Hart A 10/12/2012,10:45 AM

## 2012-10-12 NOTE — Progress Notes (Signed)
[id number] 

## 2012-10-12 NOTE — Progress Notes (Signed)
Subjective: Interval History: has complaints appetite and also right knee pain. He  denies any nausea or vomiting. He doesn't have any difficulty breathing. His cough has improved..  Objective: Vital signs in last 24 hours: Temp:  [98.4 F (36.9 C)-100.1 F (37.8 C)] 98.4 F (36.9 C) (01/20 0539) Pulse Rate:  [74-96] 84  (01/20 0539) Resp:  [14-21] 20  (01/20 0539) BP: (136-168)/(64-84) 168/77 mmHg (01/20 0539) SpO2:  [90 %-99 %] 94 % (01/20 0733) FiO2 (%):  [21 %] 21 % (01/19 2053) Weight:  [78.9 kg (173 lb 15.1 oz)] 78.9 kg (173 lb 15.1 oz) (01/20 0539) Weight change: -1.1 kg (-2 lb 6.8 oz)  Intake/Output from previous day: 01/19 0701 - 01/20 0700 In: 740 [P.O.:600; I.V.:40; IV Piggyback:100] Out: 600 [Urine:600] Intake/Output this shift:    General appearance: alert, cooperative and no distress Resp: clear to auscultation bilaterally Cardio: regular rate and rhythm, S1, S2 normal, no murmur, click, rub or gallop GI: soft, non-tender; bowel sounds normal; no masses,  no organomegaly Extremities: extremities normal, atraumatic, no cyanosis or edema  Lab Results:  Basename 10/12/12 0409 10/11/12 0458  WBC 10.6* 14.7*  HGB 7.8* 8.3*  HCT 24.3* 26.0*  PLT 119* 135*   BMET:  Basename 10/12/12 0409 10/11/12 0458  NA 139 137  K 3.2* 3.4*  CL 101 100  CO2 29 30  GLUCOSE 119* 130*  BUN 36* 29*  CREATININE 4.34* 3.14*  CALCIUM 8.1* 8.4   No results found for this basename: PTH:2 in the last 72 hours Iron Studies: No results found for this basename: IRON,TIBC,TRANSFERRIN,FERRITIN in the last 72 hours  Studies/Results: No results found.  I have reviewed the patient's current medications.  Assessment/Plan: He he problem #1 chronic renal failure 6 creatinine is 4.340. Presently patient doesn't have any uremic sinus symptoms. Patient is status post hemodialysis on Saturday. Problem #2 hypokalemia potassium is 3.2 Problem #3 anemia his hemoglobin is 7.8 hematocrit 24.3  declining patient is started on Epogen. Problem #4 hypertension blood pressure seems to be controlled good. Problem #5 knee pain: Patient with degenerative joint disease and presently doesn't have any swelling or tenderness. Problem #6 history of pneumonia he is on antibiotics and his a febrile. His white blood cell count has come down to 10.6. Problem #7 difficulty breathing most likely a compression of CHF and pneumonia presently patient seems willing better. Problem #8 metabolic bone disease patient with secondary hyperparathyroidism calcium and phosphorus is was in acceptable range. Plan: Make arrangements for patient to get dialysis.    LOS: 4 days   Najah Liverman S 10/12/2012,8:02 AM

## 2012-10-12 NOTE — Procedures (Signed)
4 hour hemodialysis treatment completed through right IJ temporary catheter.  Tolerated removal of 3.3l with no interruption in ultrafiltration.  All blood was reinfused. Catheter exit site unremarkable.

## 2012-10-12 NOTE — Progress Notes (Signed)
Randy [name], HEDDING NO.:  [id number]  MEDICAL RECORD NO.:  [id number]  LOCATION:  A336                          FACILITY:  [facility]  PHYSICIAN:  Melvyn Novas, MDDATE OF BIRTH:  05-19-1932  DATE OF PROCEDURE: DATE OF DISCHARGE:                                PROGRESS NOTE   The patient now currently undergoing dialysis for his 3rd or 4th episode.  Has uncontrolled hypertension, volume overload, CHF with normal hepatic function, mildly diminished total proteins, diminished plasma oncotic pressure, left lower lobe infiltrate, currently on vanc and Zosyn.  The patient doing well.  Blood pressure elevated again 175/93 despite multiple medicines.  WBC is 10.6, hemoglobin 7.8.  Lungs show diminished breath sounds at the bases.  No rales, wheeze, or rhonchi.  Heart: Regular rhythm.  No S3, S4 auscultated.  No heaves, thrills, or rubs.  PLAN:  Right now is to continue dialysis, continue vanc and Zosyn, and consider discharge in a day or so.     Melvyn Novas, MD     RMD/MEDQ  D:  10/12/2012  T:  10/12/2012  Job:  [id number]

## 2012-10-13 LAB — CBC
Hemoglobin: 7.8 g/dL — ABNORMAL LOW (ref 13.0–17.0)
MCH: 28.2 pg (ref 26.0–34.0)
MCHC: 31.8 g/dL (ref 30.0–36.0)
Platelets: 121 10*3/uL — ABNORMAL LOW (ref 150–400)
RBC: 2.77 MIL/uL — ABNORMAL LOW (ref 4.22–5.81)

## 2012-10-13 LAB — BASIC METABOLIC PANEL
BUN: 17 mg/dL (ref 6–23)
Calcium: 8 mg/dL — ABNORMAL LOW (ref 8.4–10.5)
GFR calc Af Amer: 19 mL/min — ABNORMAL LOW (ref >90)
GFR calc non Af Amer: 17 mL/min — ABNORMAL LOW (ref >90)
Glucose, Bld: 108 mg/dL — ABNORMAL HIGH (ref 70–99)
Potassium: 3.8 mEq/L (ref 3.5–5.1)
Sodium: 138 mEq/L (ref 135–145)

## 2012-10-13 MED ORDER — AMLODIPINE BESYLATE 10 MG PO TABS: 10.0000 mg | ORAL_TABLET | Freq: Every day | ORAL | Status: DC

## 2012-10-13 MED ORDER — HYDRALAZINE HCL 25 MG PO TABS: 100.0000 mg | ORAL_TABLET | Freq: Three times a day (TID) | ORAL | Status: DC

## 2012-10-13 MED ORDER — AMOXICILLIN-POT CLAVULANATE 875-125 MG PO TABS: 1.0000 | ORAL_TABLET | Freq: Two times a day (BID) | ORAL | Status: DC

## 2012-10-13 MED ORDER — CALCITRIOL 0.5 MCG PO CAPS: 0.5000 ug | ORAL_CAPSULE | Freq: Every day | ORAL | Status: DC

## 2012-10-13 MED ORDER — FUROSEMIDE 40 MG PO TABS: 40.0000 mg | ORAL_TABLET | Freq: Every day | ORAL | Status: DC

## 2012-10-13 MED ORDER — TUBERCULIN PPD 5 UNIT/0.1ML ID SOLN
5.0000 [IU] | Freq: Once | INTRADERMAL | Status: AC
  Administered 2012-10-13: 5 [IU] via INTRADERMAL
  Filled 2012-10-13: qty 0.1

## 2012-10-13 NOTE — Discharge Summary (Signed)
[id number] 

## 2012-10-13 NOTE — Clinical Social Work Note (Signed)
CSW spoke with Annabelle Harman at [facility]. Pt is set up for 8:30 Thursday morning for paperwork and treatment. Pt notified and CSW put information on d/c paperwork. MD to order PPD and Delena Serve will read on Thursday. Dana aware.  Derenda Fennel, [place] [phone]

## 2012-10-13 NOTE — Progress Notes (Signed)
Subjective: Interval History: has no complaint of nausea or vomiting. His appetite has this moment seems to be improving. Patient denies any difficulty increasing. Overall patient says that he's getting better..  Objective: Vital signs in last 24 hours: Temp:  [98.2 F (36.8 C)-98.7 F (37.1 C)] 98.4 F (36.9 C) (01/20 2050) Pulse Rate:  [72-88] 88  (01/20 2050) Resp:  [18-20] 18  (01/20 2050) BP: (136-190)/(62-100) 167/79 mmHg (01/21 0746) SpO2:  [92 %-99 %] 94 % (01/20 2055) FiO2 (%):  [21 %] 21 % (01/20 1605) Weight change:   Intake/Output from previous day: 01/20 0701 - 01/21 0700 In: 930 [P.O.:480; IV Piggyback:450] Out: 3525 [Urine:225] Intake/Output this shift:    Generally patient is alert in no apparent distress Chest is clear to auscultation His heart exam regular rate and rhythm no murmur Abdomen soft positive bowel sound Extremities no edema.  Lab Results:  Basename 10/13/12 0525 10/12/12 0409  WBC 8.5 10.6*  HGB 7.8* 7.8*  HCT 24.5* 24.3*  PLT 121* 119*   BMET:  Basename 10/13/12 0525 10/12/12 0409  NA 138 139  K 3.8 3.2*  CL 101 101  CO2 31 29  GLUCOSE 108* 119*  BUN 17 36*  CREATININE 3.26* 4.34*  CALCIUM 8.0* 8.1*   No results found for this basename: PTH:2 in the last 72 hours Iron Studies: No results found for this basename: IRON,TIBC,TRANSFERRIN,FERRITIN in the last 72 hours  Studies/Results: No results found.  I have reviewed the patient's current medications.  Assessment/Plan: Problem #1 renal failure presently patient is started on hemodialysis. His pedis 17 creatinine is 3.26 presently patient doesn't seem to have any uremic sinus symptoms. Problem #2 hypokalemia potassium 3.8 has improved Problem #3 anemia his hemoglobin 7.8 hematocrit 24.5 patient is started on Epogen. His hemoglobin and hematocrit is stable. Problem #4 pneumonia patient on antibiotics presently is a febrile and his white blood cell count has come down to  8.5. Problem #5 hypertension his blood pressure seems to be fluctuating Problem #6 metabolic bone disease calcium and phosphorus was in acceptable range PTH is high patient is on Hectrol Problem #7 degenerative joint disease/gout presently feeling better. Plan: We'll make arrangements for patient to get dialysis tomorrow if is going to be discharged we'll do dialysis as an outpatient.    LOS: 5 days   Chiron Campione S 10/13/2012,7:52 AM

## 2012-10-13 NOTE — Progress Notes (Signed)
Discharge instructions given to pt. With teach back given to RN. Pt. Taken to car via W/C. 

## 2012-10-14 NOTE — Discharge Summary (Signed)
NAME:  Randy [name], Randy [name] NO.:  [id number]  MEDICAL RECORD NO.:  [id number]  LOCATION:  A336                          FACILITY:  [facility]  PHYSICIAN:  Melvyn Novas, MDDATE OF BIRTH:  18-Apr-1932  DATE OF ADMISSION:  10/08/2012 DATE OF DISCHARGE:  01/21/2014LH                              DISCHARGE SUMMARY   HOSPITAL COURSE:  The patient is a 79 year old black male with a history of hypertensive heart disease with diastolic dysfunction.  Normal systolic function, hyperlipidemia progressive renal failure, who had a shunt placed several weeks ago, in anticipation of dialysis his creatinine was hovering around 5.  The patient was admitted with episode of congestive heart failure.  ProBNP was elevated at 70,000.  He was nonoliguric renal failure and had accelerated hypertension at the time. Myocardial infarction was ruled out on the basis of enzymes.  He was given preload reduction and vigorous diuresis intravenous Lasix.  He seemed to respond to this, however, he had a shunt placed temporary as his catheter placed temporarily, so the shunt was not ready for usage at present.  This is being several weeks old.  He had urgent dialysis the following day, and three episodes of dialysis which he tolerated well. He became normotensive, had fluid drained off his third space and has resolution of pulmonary vascular congestion.  He has chronic anemia from presumably renal failure.  He is given erythropoietin via dialysis.  He had multiple antihypertensive agents implied and his systolic was hovering around 130 on the last day of hospitalization.  Electrolytes were fine and he had a scheduled for outpatient dialysis organized prior to his discharge.  He likewise had a left lower lobe question of infiltrate/atelectasis from preceding pneumonia, three weeks prior to admission, while in hospital he was placed on vanc and Zosyn with improvement and radiographic appearance of  atelectasis/questionable infiltrate.  It was elected to continue him on an outpatient antibiotics oral form in the form of Augmentin 875/125 b.i.d. for an additional week.  His discharge medicines included the following; 1. Clonidine 0.2 p.o. b.i.d. 2. Hydralazine 50 mg p.o. t.i.d. 3. Labetalol 400 mg p.o. b.i.d. 4. Zocor 80 mg p.o. daily. 5. Calcitriol 0.5 mcg p.o. daily. 6. Norvasc 10 mg p.o. daily. 7. Lasix 40 mg p.o. daily. 8. Augmentin for 1 week's duration.  The patient will follow up my office in 3 days time to assess his vascular status, pulmonary congestion, renal function, and electrolytes. He will follow up in dialysis in the a.m.  This has been organized ready and a PPD was placed today on the day of discharge.     Melvyn Novas, MD     RMD/MEDQ  D:  10/13/2012  T:  10/14/2012  Job:  [id number]

## 2012-10-16 ENCOUNTER — Other Ambulatory Visit: Payer: Self-pay | Admitting: *Deleted

## 2012-10-16 ENCOUNTER — Encounter (HOSPITAL_COMMUNITY): Payer: Self-pay | Admitting: *Deleted

## 2012-10-16 MED ORDER — SODIUM CHLORIDE 0.9 % IV SOLN: 100.0000 mL | INTRAVENOUS | Status: DC | PRN

## 2012-10-16 MED ORDER — NEPRO/CARBSTEADY PO LIQD
237.0000 mL | ORAL | Status: DC | PRN
  Filled 2012-10-16: qty 237

## 2012-10-16 MED ORDER — ALTEPLASE 2 MG IJ SOLR
2.0000 mg | Freq: Once | INTRAMUSCULAR | Status: AC | PRN
  Filled 2012-10-16: qty 2

## 2012-10-16 MED ORDER — HEPARIN SODIUM (PORCINE) 1000 UNIT/ML DIALYSIS
20.0000 [IU]/kg | INTRAMUSCULAR | Status: DC | PRN
  Filled 2012-10-16: qty 2

## 2012-10-16 MED ORDER — LIDOCAINE HCL (PF) 1 % IJ SOLN
5.0000 mL | INTRAMUSCULAR | Status: DC | PRN
  Filled 2012-10-16: qty 5

## 2012-10-16 MED ORDER — LIDOCAINE-PRILOCAINE 2.5-2.5 % EX CREA
1.0000 "application " | TOPICAL_CREAM | CUTANEOUS | Status: DC | PRN
  Filled 2012-10-16: qty 5

## 2012-10-16 MED ORDER — PENTAFLUOROPROP-TETRAFLUOROETH EX AERO
1.0000 "application " | INHALATION_SPRAY | CUTANEOUS | Status: DC | PRN
  Filled 2012-10-16: qty 103.5

## 2012-10-16 MED ORDER — HEPARIN SODIUM (PORCINE) 1000 UNIT/ML DIALYSIS
300.0000 [IU] | INTRAMUSCULAR | Status: DC | PRN
  Filled 2012-10-16: qty 1

## 2012-10-16 MED ORDER — HEPARIN SODIUM (PORCINE) 1000 UNIT/ML DIALYSIS
1000.0000 [IU] | INTRAMUSCULAR | Status: DC | PRN
  Filled 2012-10-16: qty 1

## 2012-10-18 MED ORDER — DEXTROSE 5 % IV SOLN
1.5000 g | INTRAVENOUS | Status: AC
  Administered 2012-10-19: 1.5 g via INTRAVENOUS
  Filled 2012-10-18: qty 1.5

## 2012-10-19 ENCOUNTER — Ambulatory Visit (HOSPITAL_COMMUNITY)
Admission: RE | Admit: 2012-10-19 | Discharge: 2012-10-19 | Disposition: A | Discharge status: Home / Self Care | Payer: Medicare Other | Source: Ambulatory Visit | Attending: Vascular Surgery | Admitting: Vascular Surgery

## 2012-10-19 ENCOUNTER — Encounter (HOSPITAL_COMMUNITY): Payer: Self-pay | Admitting: Anesthesiology

## 2012-10-19 ENCOUNTER — Encounter (HOSPITAL_COMMUNITY)
Admission: RE | Disposition: A | Discharge status: Home / Self Care | Payer: Self-pay | Source: Ambulatory Visit | Attending: Vascular Surgery

## 2012-10-19 ENCOUNTER — Encounter (HOSPITAL_COMMUNITY): Payer: Self-pay | Admitting: *Deleted

## 2012-10-19 ENCOUNTER — Ambulatory Visit (HOSPITAL_COMMUNITY): Payer: Medicare Other

## 2012-10-19 ENCOUNTER — Ambulatory Visit (HOSPITAL_COMMUNITY): Payer: Medicare Other | Admitting: Anesthesiology

## 2012-10-19 DIAGNOSIS — I739 Peripheral vascular disease, unspecified: Secondary | ICD-10-CM | POA: Insufficient documentation

## 2012-10-19 DIAGNOSIS — D649 Anemia, unspecified: Secondary | ICD-10-CM | POA: Insufficient documentation

## 2012-10-19 DIAGNOSIS — M1990 Unspecified osteoarthritis, unspecified site: Secondary | ICD-10-CM | POA: Insufficient documentation

## 2012-10-19 DIAGNOSIS — N186 End stage renal disease: Secondary | ICD-10-CM

## 2012-10-19 DIAGNOSIS — I509 Heart failure, unspecified: Secondary | ICD-10-CM | POA: Insufficient documentation

## 2012-10-19 DIAGNOSIS — M109 Gout, unspecified: Secondary | ICD-10-CM | POA: Insufficient documentation

## 2012-10-19 DIAGNOSIS — I120 Hypertensive chronic kidney disease with stage 5 chronic kidney disease or end stage renal disease: Secondary | ICD-10-CM | POA: Insufficient documentation

## 2012-10-19 HISTORY — DX: Peripheral vascular disease, unspecified: I73.9

## 2012-10-19 HISTORY — DX: Personal history of other medical treatment: Z92.89

## 2012-10-19 HISTORY — DX: Pneumonia, unspecified organism: J18.9

## 2012-10-19 HISTORY — PX: INSERTION OF DIALYSIS CATHETER: SHX1324

## 2012-10-19 LAB — SURGICAL PCR SCREEN
MRSA, PCR: NEGATIVE
Staphylococcus aureus: NEGATIVE

## 2012-10-19 SURGERY — INSERTION OF DIALYSIS CATHETER
Anesthesia: Monitor Anesthesia Care | Site: Neck | Wound class: Clean

## 2012-10-19 MED ORDER — SODIUM CHLORIDE 0.9 % IR SOLN
Status: DC | PRN
  Administered 2012-10-19: 1000 mL

## 2012-10-19 MED ORDER — FENTANYL CITRATE 0.05 MG/ML IJ SOLN
INTRAMUSCULAR | Status: DC | PRN
  Administered 2012-10-19: 25 ug via INTRAVENOUS

## 2012-10-19 MED ORDER — LIDOCAINE-EPINEPHRINE 0.5 %-1:200000 IJ SOLN
INTRAMUSCULAR | Status: AC
  Filled 2012-10-19: qty 1

## 2012-10-19 MED ORDER — LIDOCAINE-EPINEPHRINE 0.5 %-1:200000 IJ SOLN
INTRAMUSCULAR | Status: DC | PRN
  Administered 2012-10-19: 5.6 mL

## 2012-10-19 MED ORDER — SODIUM CHLORIDE 0.9 % IV SOLN: INTRAVENOUS | Status: DC

## 2012-10-19 MED ORDER — PROPOFOL 10 MG/ML IV BOLUS
INTRAVENOUS | Status: DC | PRN
  Administered 2012-10-19: 10 mg via INTRAVENOUS
  Administered 2012-10-19: 20 mg via INTRAVENOUS

## 2012-10-19 MED ORDER — HEPARIN SODIUM (PORCINE) 1000 UNIT/ML IJ SOLN
INTRAMUSCULAR | Status: DC | PRN
  Administered 2012-10-19: 4.6 mL via INTRAVENOUS

## 2012-10-19 MED ORDER — HEPARIN SODIUM (PORCINE) 1000 UNIT/ML IJ SOLN
INTRAMUSCULAR | Status: AC
  Filled 2012-10-19: qty 1

## 2012-10-19 MED ORDER — SODIUM CHLORIDE 0.9 % IV SOLN
INTRAVENOUS | Status: DC | PRN
  Administered 2012-10-19: 10:00:00 via INTRAVENOUS

## 2012-10-19 MED ORDER — SODIUM CHLORIDE 0.9 % IR SOLN
Status: DC | PRN
  Administered 2012-10-19: 10:00:00

## 2012-10-19 MED ORDER — LABETALOL HCL 200 MG PO TABS
400.0000 mg | ORAL_TABLET | Freq: Two times a day (BID) | ORAL | Status: DC
  Administered 2012-10-19: 400 mg via ORAL
  Filled 2012-10-19: qty 2

## 2012-10-19 MED ORDER — MUPIROCIN 2 % EX OINT
TOPICAL_OINTMENT | CUTANEOUS | Status: AC
  Administered 2012-10-19: 1
  Filled 2012-10-19: qty 22

## 2012-10-19 MED ORDER — MIDAZOLAM HCL 5 MG/5ML IJ SOLN
INTRAMUSCULAR | Status: DC | PRN
  Administered 2012-10-19: 1 mg via INTRAVENOUS

## 2012-10-19 MED ORDER — MUPIROCIN 2 % EX OINT
TOPICAL_OINTMENT | Freq: Two times a day (BID) | CUTANEOUS | Status: DC
  Filled 2012-10-19: qty 22

## 2012-10-19 SURGICAL SUPPLY — 37 items
BAG DECANTER FOR FLEXI CONT (MISCELLANEOUS) ×2 IMPLANT
CATH CANNON HEMO 15FR 23CM (HEMODIALYSIS SUPPLIES) ×2 IMPLANT
CLIP LIGATING EXTRA MED SLVR (CLIP) ×2 IMPLANT
CLIP LIGATING EXTRA SM BLUE (MISCELLANEOUS) ×2 IMPLANT
CLOTH BEACON ORANGE TIMEOUT ST (SAFETY) ×2 IMPLANT
COVER PROBE W GEL 5X96 (DRAPES) ×2 IMPLANT
COVER SURGICAL LIGHT HANDLE (MISCELLANEOUS) ×2 IMPLANT
DECANTER SPIKE VIAL GLASS SM (MISCELLANEOUS) ×2 IMPLANT
DERMABOND ADVANCED (GAUZE/BANDAGES/DRESSINGS) ×1
DERMABOND ADVANCED .7 DNX12 (GAUZE/BANDAGES/DRESSINGS) ×1 IMPLANT
DRAPE C-ARM 42X72 X-RAY (DRAPES) ×2 IMPLANT
DRAPE CHEST BREAST 15X10 FENES (DRAPES) ×2 IMPLANT
GAUZE SPONGE 2X2 8PLY STRL LF (GAUZE/BANDAGES/DRESSINGS) ×1 IMPLANT
GAUZE SPONGE 4X4 16PLY XRAY LF (GAUZE/BANDAGES/DRESSINGS) ×2 IMPLANT
GLOVE SS BIOGEL STRL SZ 7.5 (GLOVE) ×1 IMPLANT
GLOVE SUPERSENSE BIOGEL SZ 7.5 (GLOVE) ×1
GOWN STRL NON-REIN LRG LVL3 (GOWN DISPOSABLE) ×4 IMPLANT
KIT BASIN OR (CUSTOM PROCEDURE TRAY) ×2 IMPLANT
KIT ROOM TURNOVER OR (KITS) ×2 IMPLANT
NEEDLE 18GX1X1/2 (RX/OR ONLY) (NEEDLE) ×2 IMPLANT
NEEDLE 22X1 1/2 (OR ONLY) (NEEDLE) ×2 IMPLANT
NEEDLE HYPO 25GX1X1/2 BEV (NEEDLE) ×2 IMPLANT
NS IRRIG 1000ML POUR BTL (IV SOLUTION) ×2 IMPLANT
PACK SURGICAL SETUP 50X90 (CUSTOM PROCEDURE TRAY) ×2 IMPLANT
PAD ARMBOARD 7.5X6 YLW CONV (MISCELLANEOUS) ×4 IMPLANT
SOAP 2 % CHG 4 OZ (WOUND CARE) ×2 IMPLANT
SPONGE GAUZE 2X2 STER 10/PKG (GAUZE/BANDAGES/DRESSINGS) ×1
SUT ETHILON 3 0 PS 1 (SUTURE) ×2 IMPLANT
SUT VICRYL 4-0 PS2 18IN ABS (SUTURE) ×2 IMPLANT
SYR 20CC LL (SYRINGE) ×2 IMPLANT
SYR 5ML LL (SYRINGE) ×4 IMPLANT
SYR CONTROL 10ML LL (SYRINGE) ×2 IMPLANT
SYRINGE 10CC LL (SYRINGE) ×2 IMPLANT
TAPE CLOTH SOFT 2X10 (GAUZE/BANDAGES/DRESSINGS) ×2 IMPLANT
TOWEL OR 17X24 6PK STRL BLUE (TOWEL DISPOSABLE) ×2 IMPLANT
TOWEL OR 17X26 10 PK STRL BLUE (TOWEL DISPOSABLE) ×2 IMPLANT
WATER STERILE IRR 1000ML POUR (IV SOLUTION) ×2 IMPLANT

## 2012-10-19 NOTE — Anesthesia Postprocedure Evaluation (Signed)
Anesthesia Post Note  Patient: Randy [name]  Procedure(s) Performed: Procedure(s) (LRB): INSERTION OF DIALYSIS CATHETER (N/A)  Anesthesia type: MAC  Patient location: PACU  Post pain: Pain level controlled  Post assessment: Patient's Cardiovascular Status Stable  Last Vitals:  Filed Vitals:   10/19/12 1037  BP:   Pulse:   Temp: 36.1 C  Resp:     Post vital signs: Reviewed and stable  Level of consciousness: alert  Complications: No apparent anesthesia complications

## 2012-10-19 NOTE — Anesthesia Preprocedure Evaluation (Addendum)
Anesthesia Evaluation  Patient identified by MRN, date of birth, ID band Patient awake    Reviewed: Allergy & Precautions, H&P , NPO status , Patient's Chart, lab work & pertinent test results, reviewed documented beta blocker date and time   Airway Mallampati: II TM Distance: >3 FB Neck ROM: full    Dental   Pulmonary shortness of breath and with exertion, pneumonia -, resolved,  breath sounds clear to auscultation        Cardiovascular hypertension, On Medications and On Home Beta Blockers + Peripheral Vascular Disease and +CHF Rhythm:regular     Neuro/Psych  Neuromuscular disease negative psych ROS   GI/Hepatic negative GI ROS, Neg liver ROS,   Endo/Other  negative endocrine ROS  Renal/GU ESRF and DialysisRenal disease  negative genitourinary   Musculoskeletal   Abdominal   Peds  Hematology  (+) anemia ,   Anesthesia Other Findings See surgeon's H&P   Reproductive/Obstetrics negative OB ROS                           Anesthesia Physical Anesthesia Plan  ASA: III  Anesthesia Plan: MAC   Post-op Pain Management:    Induction: Intravenous  Airway Management Planned: Simple Face Mask  Additional Equipment:   Intra-op Plan:   Post-operative Plan:   Informed Consent: I have reviewed the patients History and Physical, chart, labs and discussed the procedure including the risks, benefits and alternatives for the proposed anesthesia with the patient or authorized representative who has indicated his/her understanding and acceptance.   Dental Advisory Given  Plan Discussed with: CRNA and Surgeon  Anesthesia Plan Comments:         Anesthesia Quick Evaluation

## 2012-10-19 NOTE — Progress Notes (Signed)
cxr report  Faxed to  dialysis center.

## 2012-10-19 NOTE — Interval H&P Note (Signed)
History and Physical Interval Note: [facility] admission. He is being dialyzed via the temporary catheter. He is here today to have this exchanged for a tunneled dialysis catheter. 10/19/2012 9:41 AM  [name]  has presented today for surgery, with the diagnosis of ESRD  The various methods of treatment have been discussed with the patient and family. After consideration of risks, benefits and other options for treatment, the patient has consented to  Procedure(s) (LRB) with comments: INSERTION OF DIALYSIS CATHETER (N/A) - REMOVE TEMP CATH as a surgical intervention .  [name] history has been reviewed, patient examined, no change in status, stable for surgery.  I have reviewed the patient's chart and labs.  Questions were answered to the patient's satisfaction.     Ellard Nan

## 2012-10-19 NOTE — H&P (View-Only) (Signed)
Subjective: Interval History: has no complaint of nausea or vomiting. His appetite has this moment seems to be improving. Patient denies any difficulty increasing. Overall patient says that he's getting better..  Objective: Vital signs in last 24 hours: Temp:  [98.2 F (36.8 C)-98.7 F (37.1 C)] 98.4 F (36.9 C) (01/20 2050) Pulse Rate:  [72-88] 88  (01/20 2050) Resp:  [18-20] 18  (01/20 2050) BP: (136-190)/(62-100) 167/79 mmHg (01/21 0746) SpO2:  [92 %-99 %] 94 % (01/20 2055) FiO2 (%):  [21 %] 21 % (01/20 1605) Weight change:   Intake/Output from previous day: 01/20 0701 - 01/21 0700 In: 930 [P.O.:480; IV Piggyback:450] Out: 3525 [Urine:225] Intake/Output this shift:    Generally patient is alert in no apparent distress Chest is clear to auscultation His heart exam regular rate and rhythm no murmur Abdomen soft positive bowel sound Extremities no edema.  Lab Results:  Basename 10/13/12 0525 10/12/12 0409  WBC 8.5 10.6*  HGB 7.8* 7.8*  HCT 24.5* 24.3*  PLT 121* 119*   BMET:  Basename 10/13/12 0525 10/12/12 0409  NA 138 139  K 3.8 3.2*  CL 101 101  CO2 31 29  GLUCOSE 108* 119*  BUN 17 36*  CREATININE 3.26* 4.34*  CALCIUM 8.0* 8.1*   No results found for this basename: PTH:2 in the last 72 hours Iron Studies: No results found for this basename: IRON,TIBC,TRANSFERRIN,FERRITIN in the last 72 hours  Studies/Results: No results found.  I have reviewed the patient's current medications.  Assessment/Plan: Problem #1 renal failure presently patient is started on hemodialysis. His pedis 17 creatinine is 3.26 presently patient doesn't seem to have any uremic sinus symptoms. Problem #2 hypokalemia potassium 3.8 has improved Problem #3 anemia his hemoglobin 7.8 hematocrit 24.5 patient is started on Epogen. His hemoglobin and hematocrit is stable. Problem #4 pneumonia patient on antibiotics presently is a febrile and his white blood cell count has come down to  8.5. Problem #5 hypertension his blood pressure seems to be fluctuating Problem #6 metabolic bone disease calcium and phosphorus was in acceptable range PTH is high patient is on Hectrol Problem #7 degenerative joint disease/gout presently feeling better. Plan: We'll make arrangements for patient to get dialysis tomorrow if is going to be discharged we'll do dialysis as an outpatient.    LOS: 5 days   Kristiana Jacko S 10/13/2012,7:52 AM    

## 2012-10-19 NOTE — Transfer of Care (Signed)
Immediate Anesthesia Transfer of Care Note  Patient: Randy [name]  Procedure(s) Performed: Procedure(s) (LRB) with comments: INSERTION OF DIALYSIS CATHETER (N/A) - REMOVE TEMPORARY CATH  Patient Location: PACU  Anesthesia Type:MAC  Level of Consciousness: awake, alert , oriented and patient cooperative  Airway & Oxygen Therapy: Patient Spontanous Breathing  Post-op Assessment: Report given to PACU RN, Post -op Vital signs reviewed and stable and Patient moving all extremities  Post vital signs: Reviewed and stable  Complications: No apparent anesthesia complications

## 2012-10-19 NOTE — Progress Notes (Signed)
10/19/12 0858  OBSTRUCTIVE SLEEP APNEA  Have you ever been diagnosed with sleep apnea through a sleep study? No  Do you snore loudly (loud enough to be heard through closed doors)?  0  Do you often feel tired, fatigued, or sleepy during the daytime? 1  Has anyone observed you stop breathing during your sleep? 0  Do you have, or are you being treated for high blood pressure? 1  BMI more than 35 kg/m2? 0  Age over 77 years old? 1  Neck circumference greater than 40 cm/18 inches? 0  Gender: 1  Obstructive Sleep Apnea Score 4   Score 4 or greater  Results sent to PCP

## 2012-10-19 NOTE — Op Note (Signed)
OPERATIVE REPORT  DATE OF SURGERY: 10/19/2012  PATIENT: Randy [name], 79 y.o. male MRN: [id number]  DOB: 01-08-1932  PRE-OPERATIVE DIAGNOSIS: End-stage renal disease  POST-OPERATIVE DIAGNOSIS:  Same  PROCEDURE: Right IJ tunneled dialysis catheter replacing non- tunneled catheter  SURGEON:  Gretta Began, M.D.    ANESTHESIA:  Local with sedation  EBL: Minimal ml  Total I/O In: 100 [I.V.:100] Out: 25 [Blood:25]  BLOOD ADMINISTERED: None  DRAINS: None  SPECIMEN: None  COUNTS CORRECT:  YES  PLAN OF CARE: PACU with chest x-ray pending   PATIENT DISPOSITION:  PACU - hemodynamically stable  PROCEDURE DETAILS:  the patient was taken to the operating room placed supine position where the area of the right and left neck were prepped and draped in usual sterile fashion. The patient had a temporary dialysis catheter in place. The catheter was prepped as well in the field. The catheter was grasped with a hemostat at the exit site and was divided with scissors. A guidewire was passed down through the existing catheter and the catheter was removed. Fluoroscopy was used to confirm that this was in the right atrium. A 23 cm hemodialysis catheter was brought onto the field. A dilator and peel-away sheath was passed over the guidewire. The dilator and guidewire removed. The dialysis catheter was passed through the peel-away sheath and the peel-away sheath was removed. The catheter was positioned with the tips in the distal right atrium. The catheter was brought through a separate stab incision through a subcutaneous tunnel. The 2 lm ports were attached in both lumens flushed and aspirated easily and were locked with 1000 unit per cc heparin. The catheter was secured to the skin with a 4-0 nylon stitch and the entry site was closed 4-0 subcuticular Vicryl stitch. Sterile dressing was applied. The patient was taken to the recovery room in stable condition   Gretta Began, M.D. 10/19/2012 10:45  AM

## 2012-10-20 ENCOUNTER — Encounter (HOSPITAL_COMMUNITY): Payer: Self-pay | Admitting: Vascular Surgery

## 2012-11-02 ENCOUNTER — Ambulatory Visit (HOSPITAL_COMMUNITY): Payer: Medicare Other | Admitting: Oncology

## 2012-11-07 LAB — AFB CULTURE WITH SMEAR (NOT AT ARMC): Acid Fast Smear: NONE SEEN

## 2012-11-10 ENCOUNTER — Encounter (HOSPITAL_COMMUNITY): Payer: Self-pay

## 2012-11-13 LAB — AFB CULTURE WITH SMEAR (NOT AT ARMC)

## 2013-02-22 ENCOUNTER — Ambulatory Visit: Payer: Self-pay | Admitting: Vascular Surgery

## 2013-02-22 LAB — CBC
HCT: 33.6 % — ABNORMAL LOW (ref 40.0–52.0)
MCH: 30.9 pg (ref 26.0–34.0)
MCV: 92 fL (ref 80–100)
Platelet: 240 10*3/uL (ref 150–440)
RDW: 15.3 % — ABNORMAL HIGH (ref 11.5–14.5)

## 2013-02-22 LAB — BASIC METABOLIC PANEL
Anion Gap: 9 (ref 7–16)
Calcium, Total: 8.9 mg/dL (ref 8.5–10.1)
Co2: 24 mmol/L (ref 21–32)
EGFR (African American): 9 — ABNORMAL LOW
Glucose: 87 mg/dL (ref 65–99)
Osmolality: 284 (ref 275–301)
Potassium: 3.6 mmol/L (ref 3.5–5.1)
Sodium: 136 mmol/L (ref 136–145)

## 2013-02-24 ENCOUNTER — Ambulatory Visit: Payer: Self-pay | Admitting: Vascular Surgery

## 2013-06-15 ENCOUNTER — Encounter: Payer: Self-pay | Admitting: *Deleted

## 2013-09-23 DIAGNOSIS — I214 Non-ST elevation (NSTEMI) myocardial infarction: Secondary | ICD-10-CM

## 2013-09-23 HISTORY — DX: Non-ST elevation (NSTEMI) myocardial infarction: I21.4

## 2014-03-28 ENCOUNTER — Ambulatory Visit (HOSPITAL_COMMUNITY)
Admission: RE | Admit: 2014-03-28 | Discharge: 2014-03-28 | Disposition: A | Discharge status: Home / Self Care | Payer: Medicare Other | Source: Ambulatory Visit | Attending: Pulmonary Disease | Admitting: Pulmonary Disease

## 2014-03-28 ENCOUNTER — Other Ambulatory Visit (HOSPITAL_COMMUNITY): Payer: Self-pay | Admitting: Pulmonary Disease

## 2014-03-28 DIAGNOSIS — J90 Pleural effusion, not elsewhere classified: Secondary | ICD-10-CM | POA: Insufficient documentation

## 2014-03-28 DIAGNOSIS — I359 Nonrheumatic aortic valve disorder, unspecified: Secondary | ICD-10-CM

## 2014-03-28 DIAGNOSIS — I509 Heart failure, unspecified: Secondary | ICD-10-CM | POA: Insufficient documentation

## 2014-03-28 NOTE — Progress Notes (Signed)
  Echocardiogram 2D Echocardiogram has been performed.  Rittman, [place] 03/28/2014, 11:27 AM

## 2014-06-21 ENCOUNTER — Encounter (HOSPITAL_COMMUNITY): Payer: Self-pay | Admitting: Emergency Medicine

## 2014-06-21 ENCOUNTER — Inpatient Hospital Stay (HOSPITAL_COMMUNITY)
Admission: EM | Admit: 2014-06-21 | Discharge: 2014-06-23 | DRG: 291 | Disposition: A | Discharge status: Home / Self Care | Payer: Medicare Other | Attending: Family Medicine | Admitting: Family Medicine

## 2014-06-21 ENCOUNTER — Emergency Department (HOSPITAL_COMMUNITY): Payer: Medicare Other

## 2014-06-21 DIAGNOSIS — N186 End stage renal disease: Secondary | ICD-10-CM | POA: Diagnosis present

## 2014-06-21 DIAGNOSIS — J9600 Acute respiratory failure, unspecified whether with hypoxia or hypercapnia: Secondary | ICD-10-CM | POA: Diagnosis not present

## 2014-06-21 DIAGNOSIS — Z87891 Personal history of nicotine dependence: Secondary | ICD-10-CM

## 2014-06-21 DIAGNOSIS — E780 Pure hypercholesterolemia: Secondary | ICD-10-CM | POA: Diagnosis present

## 2014-06-21 DIAGNOSIS — Z809 Family history of malignant neoplasm, unspecified: Secondary | ICD-10-CM | POA: Diagnosis not present

## 2014-06-21 DIAGNOSIS — Z992 Dependence on renal dialysis: Secondary | ICD-10-CM | POA: Diagnosis not present

## 2014-06-21 DIAGNOSIS — J9601 Acute respiratory failure with hypoxia: Secondary | ICD-10-CM

## 2014-06-21 DIAGNOSIS — N2581 Secondary hyperparathyroidism of renal origin: Secondary | ICD-10-CM | POA: Diagnosis present

## 2014-06-21 DIAGNOSIS — I739 Peripheral vascular disease, unspecified: Secondary | ICD-10-CM | POA: Diagnosis present

## 2014-06-21 DIAGNOSIS — I132 Hypertensive heart and chronic kidney disease with heart failure and with stage 5 chronic kidney disease, or end stage renal disease: Principal | ICD-10-CM | POA: Diagnosis present

## 2014-06-21 DIAGNOSIS — G2581 Restless legs syndrome: Secondary | ICD-10-CM

## 2014-06-21 DIAGNOSIS — M1990 Unspecified osteoarthritis, unspecified site: Secondary | ICD-10-CM | POA: Diagnosis present

## 2014-06-21 DIAGNOSIS — Z9111 Patient's noncompliance with dietary regimen: Secondary | ICD-10-CM | POA: Diagnosis present

## 2014-06-21 DIAGNOSIS — D638 Anemia in other chronic diseases classified elsewhere: Secondary | ICD-10-CM | POA: Diagnosis present

## 2014-06-21 DIAGNOSIS — R0602 Shortness of breath: Secondary | ICD-10-CM | POA: Diagnosis present

## 2014-06-21 DIAGNOSIS — E785 Hyperlipidemia, unspecified: Secondary | ICD-10-CM | POA: Diagnosis present

## 2014-06-21 DIAGNOSIS — J810 Acute pulmonary edema: Secondary | ICD-10-CM

## 2014-06-21 DIAGNOSIS — I429 Cardiomyopathy, unspecified: Secondary | ICD-10-CM | POA: Diagnosis present

## 2014-06-21 DIAGNOSIS — IMO0001 Reserved for inherently not codable concepts without codable children: Secondary | ICD-10-CM | POA: Diagnosis not present

## 2014-06-21 DIAGNOSIS — N19 Unspecified kidney failure: Secondary | ICD-10-CM

## 2014-06-21 DIAGNOSIS — I350 Nonrheumatic aortic (valve) stenosis: Secondary | ICD-10-CM | POA: Diagnosis present

## 2014-06-21 DIAGNOSIS — I5043 Acute on chronic combined systolic (congestive) and diastolic (congestive) heart failure: Secondary | ICD-10-CM | POA: Diagnosis present

## 2014-06-21 DIAGNOSIS — I119 Hypertensive heart disease without heart failure: Secondary | ICD-10-CM | POA: Diagnosis present

## 2014-06-21 DIAGNOSIS — I161 Hypertensive emergency: Secondary | ICD-10-CM

## 2014-06-21 DIAGNOSIS — I428 Other cardiomyopathies: Secondary | ICD-10-CM

## 2014-06-21 DIAGNOSIS — Z9119 Patient's noncompliance with other medical treatment and regimen: Secondary | ICD-10-CM | POA: Diagnosis present

## 2014-06-21 DIAGNOSIS — I10 Essential (primary) hypertension: Secondary | ICD-10-CM

## 2014-06-21 DIAGNOSIS — I110 Hypertensive heart disease with heart failure: Secondary | ICD-10-CM

## 2014-06-21 LAB — CBC WITH DIFFERENTIAL/PLATELET
Basophils Absolute: 0 10*3/uL (ref 0.0–0.1)
Basophils Relative: 0 % (ref 0–1)
EOS PCT: 5 % (ref 0–5)
Eosinophils Absolute: 0.3 10*3/uL (ref 0.0–0.7)
HEMATOCRIT: 31.2 % — AB (ref 39.0–52.0)
Hemoglobin: 9.9 g/dL — ABNORMAL LOW (ref 13.0–17.0)
LYMPHS ABS: 1.2 10*3/uL (ref 0.7–4.0)
LYMPHS PCT: 17 % (ref 12–46)
MCH: 31.6 pg (ref 26.0–34.0)
MCHC: 31.7 g/dL (ref 30.0–36.0)
MCV: 99.7 fL (ref 78.0–100.0)
MONO ABS: 0.7 10*3/uL (ref 0.1–1.0)
Monocytes Relative: 9 % (ref 3–12)
Neutro Abs: 5 10*3/uL (ref 1.7–7.7)
Neutrophils Relative %: 69 % (ref 43–77)
Platelets: 359 10*3/uL (ref 150–400)
RBC: 3.13 MIL/uL — AB (ref 4.22–5.81)
RDW: 17.4 % — ABNORMAL HIGH (ref 11.5–15.5)
WBC: 7.2 10*3/uL (ref 4.0–10.5)

## 2014-06-21 LAB — BASIC METABOLIC PANEL
Anion gap: 16 — ABNORMAL HIGH (ref 5–15)
BUN: 66 mg/dL — ABNORMAL HIGH (ref 6–23)
CALCIUM: 9.7 mg/dL (ref 8.4–10.5)
CO2: 27 meq/L (ref 19–32)
CREATININE: 12.3 mg/dL — AB (ref 0.50–1.35)
Chloride: 98 mEq/L (ref 96–112)
GFR calc Af Amer: 4 mL/min — ABNORMAL LOW (ref >90)
GFR calc non Af Amer: 3 mL/min — ABNORMAL LOW (ref >90)
GLUCOSE: 146 mg/dL — AB (ref 70–99)
Potassium: 4.4 mEq/L (ref 3.7–5.3)
Sodium: 141 mEq/L (ref 137–147)

## 2014-06-21 LAB — GLUCOSE, CAPILLARY: Glucose-Capillary: 117 mg/dL — ABNORMAL HIGH (ref 70–99)

## 2014-06-21 LAB — TROPONIN I
TROPONIN I: 0.63 ng/mL — AB (ref <0.30)
TROPONIN I: 0.73 ng/mL — AB (ref <0.30)
Troponin I: <0.3 ng/mL (ref <0.30)

## 2014-06-21 LAB — MRSA PCR SCREENING: MRSA by PCR: NEGATIVE

## 2014-06-21 LAB — PRO B NATRIURETIC PEPTIDE: Pro B Natriuretic peptide (BNP): >70000 pg/mL — ABNORMAL HIGH (ref 0–450)

## 2014-06-21 LAB — PROTIME-INR
INR: 1.01 (ref 0.00–1.49)
PROTHROMBIN TIME: 13.3 s (ref 11.6–15.2)

## 2014-06-21 MED ORDER — CETYLPYRIDINIUM CHLORIDE 0.05 % MT LIQD
7.0000 mL | Freq: Two times a day (BID) | OROMUCOSAL | Status: DC
  Administered 2014-06-21 – 2014-06-23 (×4): 7 mL via OROMUCOSAL

## 2014-06-21 MED ORDER — SENNOSIDES-DOCUSATE SODIUM 8.6-50 MG PO TABS: 1.0000 | ORAL_TABLET | Freq: Every evening | ORAL | Status: DC | PRN

## 2014-06-21 MED ORDER — SODIUM CHLORIDE 0.9 % IJ SOLN
3.0000 mL | Freq: Two times a day (BID) | INTRAMUSCULAR | Status: DC
  Administered 2014-06-21 – 2014-06-22 (×2): 3 mL via INTRAVENOUS

## 2014-06-21 MED ORDER — GABAPENTIN 100 MG PO CAPS
200.0000 mg | ORAL_CAPSULE | Freq: Once | ORAL | Status: AC
  Administered 2014-06-21: 200 mg via ORAL
  Filled 2014-06-21: qty 2

## 2014-06-21 MED ORDER — HEPARIN SODIUM (PORCINE) 5000 UNIT/ML IJ SOLN
5000.0000 [IU] | Freq: Three times a day (TID) | INTRAMUSCULAR | Status: DC
  Filled 2014-06-21: qty 1

## 2014-06-21 MED ORDER — AMLODIPINE BESYLATE 5 MG PO TABS
10.0000 mg | ORAL_TABLET | Freq: Every day | ORAL | Status: DC
  Administered 2014-06-21 – 2014-06-23 (×3): 10 mg via ORAL
  Filled 2014-06-21 (×3): qty 2

## 2014-06-21 MED ORDER — ROPINIROLE HCL 0.25 MG PO TABS
0.2500 mg | ORAL_TABLET | Freq: Every day | ORAL | Status: DC
  Administered 2014-06-21 – 2014-06-22 (×2): 0.25 mg via ORAL
  Filled 2014-06-21 (×3): qty 1

## 2014-06-21 MED ORDER — NITROGLYCERIN IN D5W 200-5 MCG/ML-% IV SOLN
5.0000 ug/min | INTRAVENOUS | Status: DC
  Administered 2014-06-21: 5 ug/min via INTRAVENOUS
  Filled 2014-06-21: qty 250

## 2014-06-21 MED ORDER — ACETAMINOPHEN 325 MG PO TABS: 650.0000 mg | ORAL_TABLET | Freq: Four times a day (QID) | ORAL | Status: DC | PRN

## 2014-06-21 MED ORDER — ENOXAPARIN SODIUM 40 MG/0.4ML ~~LOC~~ SOLN: 40.0000 mg | SUBCUTANEOUS | Status: DC

## 2014-06-21 MED ORDER — LIDOCAINE HCL (PF) 1 % IJ SOLN: 5.0000 mL | INTRAMUSCULAR | Status: DC | PRN

## 2014-06-21 MED ORDER — SODIUM CHLORIDE 0.9 % IV SOLN: 100.0000 mL | INTRAVENOUS | Status: DC | PRN

## 2014-06-21 MED ORDER — ASPIRIN 325 MG PO TABS
325.0000 mg | ORAL_TABLET | Freq: Every day | ORAL | Status: DC
  Administered 2014-06-21 – 2014-06-22 (×2): 325 mg via ORAL
  Filled 2014-06-21 (×2): qty 1

## 2014-06-21 MED ORDER — CALCITRIOL 0.25 MCG PO CAPS
0.5000 ug | ORAL_CAPSULE | Freq: Every day | ORAL | Status: DC
  Administered 2014-06-22 – 2014-06-23 (×2): 0.5 ug via ORAL
  Filled 2014-06-21 (×2): qty 2

## 2014-06-21 MED ORDER — HEPARIN BOLUS VIA INFUSION
2000.0000 [IU] | Freq: Once | INTRAVENOUS | Status: AC
  Administered 2014-06-21: 2000 [IU] via INTRAVENOUS
  Filled 2014-06-21: qty 2000

## 2014-06-21 MED ORDER — HEPARIN (PORCINE) IN NACL 100-0.45 UNIT/ML-% IJ SOLN
1500.0000 [IU]/h | INTRAMUSCULAR | Status: DC
  Administered 2014-06-21: 900 [IU]/h via INTRAVENOUS
  Administered 2014-06-22: 1000 [IU]/h via INTRAVENOUS
  Administered 2014-06-22: 1500 [IU]/h via INTRAVENOUS
  Filled 2014-06-21 (×2): qty 250

## 2014-06-21 MED ORDER — FUROSEMIDE 10 MG/ML IJ SOLN
40.0000 mg | Freq: Once | INTRAMUSCULAR | Status: AC
  Administered 2014-06-21: 40 mg via INTRAVENOUS
  Filled 2014-06-21: qty 4

## 2014-06-21 MED ORDER — ALTEPLASE 2 MG IJ SOLR: 2.0000 mg | Freq: Once | INTRAMUSCULAR | Status: AC | PRN

## 2014-06-21 MED ORDER — EPOETIN ALFA 4000 UNIT/ML IJ SOLN
4000.0000 [IU] | INTRAMUSCULAR | Status: DC
  Filled 2014-06-21: qty 1

## 2014-06-21 MED ORDER — ACETAMINOPHEN 650 MG RE SUPP: 650.0000 mg | Freq: Four times a day (QID) | RECTAL | Status: DC | PRN

## 2014-06-21 MED ORDER — PENTAFLUOROPROP-TETRAFLUOROETH EX AERO
1.0000 "application " | INHALATION_SPRAY | CUTANEOUS | Status: DC | PRN
  Filled 2014-06-21: qty 30

## 2014-06-21 MED ORDER — LABETALOL HCL 200 MG PO TABS
400.0000 mg | ORAL_TABLET | Freq: Two times a day (BID) | ORAL | Status: DC
  Administered 2014-06-21 – 2014-06-23 (×4): 400 mg via ORAL
  Filled 2014-06-21 (×9): qty 2

## 2014-06-21 MED ORDER — ATORVASTATIN CALCIUM 40 MG PO TABS
40.0000 mg | ORAL_TABLET | Freq: Every day | ORAL | Status: DC
  Filled 2014-06-21: qty 1

## 2014-06-21 MED ORDER — ONDANSETRON HCL 4 MG/2ML IJ SOLN: 4.0000 mg | Freq: Four times a day (QID) | INTRAMUSCULAR | Status: DC | PRN

## 2014-06-21 MED ORDER — HEPARIN SODIUM (PORCINE) 1000 UNIT/ML DIALYSIS
1000.0000 [IU] | INTRAMUSCULAR | Status: DC | PRN
  Filled 2014-06-21: qty 1

## 2014-06-21 MED ORDER — INSULIN ASPART 100 UNIT/ML ~~LOC~~ SOLN: 0.0000 [IU] | Freq: Three times a day (TID) | SUBCUTANEOUS | Status: DC

## 2014-06-21 MED ORDER — ALUM & MAG HYDROXIDE-SIMETH 200-200-20 MG/5ML PO SUSP: 30.0000 mL | Freq: Four times a day (QID) | ORAL | Status: DC | PRN

## 2014-06-21 MED ORDER — LIDOCAINE-PRILOCAINE 2.5-2.5 % EX CREA
1.0000 | TOPICAL_CREAM | CUTANEOUS | Status: DC | PRN
Start: 2014-06-21 — End: 2014-06-23
  Filled 2014-06-21: qty 5

## 2014-06-21 MED ORDER — CLONIDINE HCL 0.2 MG PO TABS
0.2000 mg | ORAL_TABLET | Freq: Two times a day (BID) | ORAL | Status: DC
  Administered 2014-06-21 – 2014-06-23 (×4): 0.2 mg via ORAL
  Filled 2014-06-21 (×5): qty 1

## 2014-06-21 MED ORDER — NEPRO/CARBSTEADY PO LIQD
237.0000 mL | ORAL | Status: DC | PRN
Start: 2014-06-21 — End: 2014-06-23

## 2014-06-21 MED ORDER — ONDANSETRON HCL 4 MG PO TABS: 4.0000 mg | ORAL_TABLET | Freq: Four times a day (QID) | ORAL | Status: DC | PRN

## 2014-06-21 MED ORDER — LIDOCAINE HCL (PF) 2 % IJ SOLN
0.2000 mL | Freq: Once | INTRAMUSCULAR | Status: AC
  Administered 2014-06-21: 18:00:00 via INTRADERMAL
  Filled 2014-06-21 (×2): qty 2

## 2014-06-21 MED ORDER — FUROSEMIDE 10 MG/ML IJ SOLN
40.0000 mg | Freq: Two times a day (BID) | INTRAMUSCULAR | Status: DC
  Administered 2014-06-22 – 2014-06-23 (×3): 40 mg via INTRAVENOUS
  Filled 2014-06-21 (×4): qty 4

## 2014-06-21 MED ORDER — HYDROCODONE-ACETAMINOPHEN 5-325 MG PO TABS
1.0000 | ORAL_TABLET | ORAL | Status: DC | PRN
  Filled 2014-06-21: qty 1

## 2014-06-21 MED ORDER — HYDRALAZINE HCL 25 MG PO TABS
50.0000 mg | ORAL_TABLET | Freq: Three times a day (TID) | ORAL | Status: DC
  Administered 2014-06-21 – 2014-06-23 (×5): 50 mg via ORAL
  Filled 2014-06-21 (×11): qty 2

## 2014-06-21 MED ORDER — SODIUM CHLORIDE 0.9 % IJ SOLN: 3.0000 mL | INTRAMUSCULAR | Status: DC | PRN

## 2014-06-21 MED ORDER — HEPARIN SODIUM (PORCINE) 1000 UNIT/ML DIALYSIS
20.0000 [IU]/kg | INTRAMUSCULAR | Status: DC | PRN
  Administered 2014-06-21: 1500 [IU] via INTRAVENOUS_CENTRAL
  Filled 2014-06-21: qty 2

## 2014-06-21 MED ORDER — SODIUM CHLORIDE 0.9 % IV SOLN: 250.0000 mL | INTRAVENOUS | Status: DC | PRN

## 2014-06-21 NOTE — ED Provider Notes (Signed)
CSN: [id number]     Arrival date & time 06/21/14  1025 History  This chart was scribed for Sharyon Cable, MD by Delphia Grates, ED Scribe. This patient was seen in room APA08/APA08 and the patient's care was started at 10:40 AM.    Chief Complaint  Patient presents with  . Shortness of Breath    Patient is a 80 y.o. male presenting with shortness of breath. The history is provided by the patient. No language interpreter was used.  Shortness of Breath Severity:  Moderate Timing:  Constant Associated symptoms: chest pain (yesterday) and wheezing     HPI Comments: Randy [name] is a 80 y.o. male, with history of CHF, HTN,renal failure who presents to the Emergency Department complaining of moderate SOB onset this morning. Patient states his SOB has gotten worse since he ate. He states he is not on oxygen at home, and reports history of similar episodes that resolve with breathing treatments. He denies fever or any pain at present. Patient is on peritoneal dialysis at home, denies any complications, and states he still produces urine.  He reports an episode of chest burning yesterday but none at this time.  Past Medical History  Diagnosis Date  . Hypertensive heart disease   . Hypercholesterolemia   . Gout   . Secondary cardiomyopathy     LVEF 40-45%  . Anemia of chronic disease   . Hypertension   . CHF (congestive heart failure)   . Arthritis   . CKD (chronic kidney disease) stage 4, GFR 15-29 ml/min     MWF- Hemodialysis  . Peripheral vascular disease   . Shortness of breath   . Pneumonia   . Constipation   . History of blood transfusion    Past Surgical History  Procedure Laterality Date  . Knee surgery  2007    Right knee, arthroscopy  . Back surgery  2004    lumbar  . Hemorrhoid surgery  40 years ago  . Ganglion cyst excision  01/03/2012    Procedure: REMOVAL GANGLION OF WRIST;  Surgeon: Scherry Ran, MD;  Location: [facility];  Service: General;  Laterality:  Right;  . Colonscopy    . Av fistula placement  08/24/2012    Procedure: ARTERIOVENOUS (AV) FISTULA CREATION;  Surgeon: Rosetta Posner, MD;  Location: [place];  Service: Vascular;  Laterality: Left;  . Insertion of dialysis catheter      right neck  . Insertion of dialysis catheter  10/19/2012    Procedure: INSERTION OF DIALYSIS CATHETER;  Surgeon: Rosetta Posner, MD;  Location: [facility];  Service: Vascular;  Laterality: N/A;  REMOVE TEMPORARY CATH   Family History  Problem Relation Age of Onset  . Arthritis    . Cancer    . Kidney disease    . Anesthesia problems Neg Hx   . Hypotension Neg Hx   . Malignant hyperthermia Neg Hx   . Pseudochol deficiency Neg Hx   . Cancer Sister   . Cancer Brother     colon  . Cancer Brother     colon   History  Substance Use Topics  . Smoking status: Former Smoker -- 1.00 packs/day for 30 years    Types: Cigarettes    Quit date: 08/19/1998  . Smokeless tobacco: Former Systems developer    Quit date: 12/31/1993     Comment: quit cigs in 1999  . Alcohol Use: No    Review of Systems  Respiratory: Positive for shortness of  breath and wheezing.   Cardiovascular: Positive for chest pain (yesterday).  All other systems reviewed and are negative.     Allergies  Review of patient's allergies indicates no known allergies.  Home Medications   Prior to Admission medications   Medication Sig Start Date End Date Taking? Authorizing Provider  amLODipine (NORVASC) 10 MG tablet Take 1 tablet (10 mg total) by mouth daily. 10/13/12   [name], MD  amoxicillin-clavulanate (AUGMENTIN) 875-125 MG per tablet Take 1 tablet by mouth 2 (two) times daily. 10/13/12   [name], MD  calcitRIOL (ROCALTROL) 0.5 MCG capsule Take 1 capsule (0.5 mcg total) by mouth daily. 10/13/12   [name], MD  cloNIDine (CATAPRES) 0.2 MG tablet Take 1 tablet (0.2 mg total) by mouth 2 (two) times daily. 08/29/12   [name], MD  furosemide (LASIX) 40 MG tablet Take 1  tablet (40 mg total) by mouth daily. 10/13/12   [name], MD  hydrALAZINE (APRESOLINE) 50 MG tablet Take 1 tablet (50 mg total) by mouth 3 (three) times daily. 06/27/12   [name], MD  labetalol (NORMODYNE) 200 MG tablet Take 2 tablets (400 mg total) by mouth 2 (two) times daily. 07/17/12   Satira Sark, MD  Multiple Vitamin (MULITIVITAMIN WITH MINERALS) TABS Take 1 tablet by mouth daily.    Historical Provider, MD  simvastatin (ZOCOR) 80 MG tablet Take 80 mg by mouth at bedtime.      Historical Provider, MD   Triage Vitals: BP 201/132  Pulse 120  Temp(Src) 97.8 F (36.6 C) (Oral)  Resp 28  Ht 5\' 9"  (1.753 m)  Wt 163 lb (73.936 kg)  BMI 24.06 kg/m2  SpO2 88%  Physical Exam CONSTITUTIONAL: Well developed/well nourished, ill-appearing HEAD: Normocephalic/atraumatic EYES: EOMI/PERRL ENMT: Mucous membranes moist NECK: supple no meningeal signs SPINE:entire spine nontender CV: tachycardic, no loud murmurs noted LUNGS: Tachypneic, wheezes bialterally ABDOMEN: soft, nontender, no rebound or guarding, PD catheter noted. No focal tenderness GU:no cva tenderness NEURO: Pt is awake/alert, moves all extremitiesx4 EXTREMITIES: pulses normal, full ROM SKIN: warm, color normal PSYCH: no abnormalities of mood noted  ED Course  Procedures  CRITICAL CARE Performed by: Sharyon Cable Total critical care time: 35 Critical care time was exclusive of separately billable procedures and treating other patients. Critical care was necessary to treat or prevent imminent or life-threatening deterioration. Critical care was time spent personally by me on the following activities: development of treatment plan with patient and/or surrogate as well as nursing, discussions with consultants, evaluation of patient's response to treatment, examination of patient, obtaining history from patient or surrogate, ordering and performing treatments and interventions, ordering and review of  laboratory studies, ordering and review of radiographic studies, pulse oximetry and re-evaluation of patient's condition. PATIENT REQUIRING IV NITROGLYCERIN DRIP, REQUIRED IV LASIX AND REQUIRED BIPAP FOR RESPIRATORY FAILURE  DIAGNOSTIC STUDIES: Oxygen Saturation is 88% on room air, low by my interpretation.    COORDINATION OF CARE: At 7902 Discussed treatment plan with patient which includes Lasix, nitroglycerin, and bipap. Patient agrees.   11:40 AM D/w dr Ree Kida Will admit to stepdown unit  PT IMPROVED ON BIPAP AND ALSO NTG DRIP HIS WORK OF BREATHING HAS IMPROVED PT HAS BEEN STABILIZED IN THE ER   Labs Review Labs Reviewed  CBC WITH DIFFERENTIAL - Abnormal; Notable for the following:    RBC 3.13 (*)    Hemoglobin 9.9 (*)    HCT 31.2 (*)    RDW 17.4 (*)  All other components within normal limits  BASIC METABOLIC PANEL - Abnormal; Notable for the following:    Glucose, Bld 146 (*)    BUN 66 (*)    Creatinine, Ser 12.30 (*)    GFR calc non Af Amer 3 (*)    GFR calc Af Amer 4 (*)    Anion gap 16 (*)    All other components within normal limits  TROPONIN I  PRO B NATRIURETIC PEPTIDE    Imaging Review Dg Chest Portable 1 View  06/21/2014   CLINICAL DATA:  Severe shortness of breath since yesterday. , also cough ; history of CHF and hypertension and chronic renal insufficiency  EXAM: PORTABLE CHEST - 1 VIEW  COMPARISON:  PA and lateral chest x-ray of March 28, 2014  FINDINGS: The lungs are mildly hyperinflated. There is increased pleural fluid on the left. The pulmonary vascularity is engorged and the interstitial markings are increased bilaterally. The cardiopericardial silhouette is enlarged. There is mild tortuosity of the descending thoracic aorta. The bony thorax exhibits no acute abnormality.  IMPRESSION: Congestive heart failure with interstitial edema and small left pleural effusion. There may be underlying COPD.   Electronically Signed   By: David  Martinique   On: 06/21/2014  10:58     Date: 06/21/2014  Rate: 116  Rhythm: sinus tachycardia  QRS Axis: normal  Intervals: QT prolonged  ST/T Wave abnormalities: nonspecific ST changes st depression laterally  Conduction Disutrbances:LVH pattern noted  Narrative Interpretation:   Old EKG Reviewed: none available due to technical difficulties     MDM   Final diagnoses:  Hypertensive emergency  Acute pulmonary edema  Renal failure  Acute respiratory failure with hypoxia    Nursing notes including past medical history and social history reviewed and considered in documentation Labs/vital reviewed and considered   I personally performed the services described in this documentation, which was scribed in my presence. The recorded information has been reviewed and is accurate.      Sharyon Cable, MD 06/21/14 1158

## 2014-06-21 NOTE — ED Notes (Signed)
PT c/o shortness of breath onset this am after feeling a burning in his chest yesterday evening. PT on peritoneal dialysis at home. O2 @2L  applied to pt at this time. PT appears to be anxious and tachypnea.

## 2014-06-21 NOTE — ED Notes (Signed)
Pt states "having hard time breathing", Pt placed back on BiPap per Dr. Christy Gentles.

## 2014-06-21 NOTE — Progress Notes (Signed)
Pt weaned down to 2lpm cann sat 100% no distress notes

## 2014-06-21 NOTE — ED Notes (Signed)
Take patient off bipap per Dr. Christy Gentles. Pt place on 2L O2 via [place].

## 2014-06-21 NOTE — ED Provider Notes (Signed)
I was informed that pt will not be able to undergo peritoneal dialysis at this hospital [facility] will transfer to [facility]  Sharyon Cable, MD 06/21/14 1224

## 2014-06-21 NOTE — H&P (Signed)
I have directly reviewed the clinical findings, lab, imaging studies and management of this patient in detail. I have interviewed and examined the patient and agree with the documentation,  as recorded by the Physician extender, Ms. Dyanne Carrel, NP.  Is an 80 year old male with a history of end-stage renal disease requiring peritoneal dialysis, systolic congestive heart failure with cardiomyopathy, hypertensive heart disease that presents to the emergency department with complaints of sudden onset shortness of breath. Patient was noted to be hypoxic, tachypneic as well as tachycardic upon arrival to emergency department and required BiPAP. He is also an accelerated hypertension and placed on nitroglycerin drip.  Assessment and plan Acute hypoxic respiratory failure -Likely complicated by heart failure as well as accelerated hypertension versus incisional disease -Continue patient on BiPAP, will try to wean off of the nitroglycerin drip, continue on IV Lasix. -Obtain echocardiogram to evaluate his LV function.  -Will also consult nephrology for further management.  Accelerated hypertension -Patient did not take his medications this morning, will restart his medications as well as continue to wean off the nitroglycerin drip.  End-stage renal disease requiring peritoneal dialysis -Patient will be transferred to [facility] as peritoneal dialysis was not performed here at [facility] -Nephrology is to be consulted once patient arrives to [facility]  Acute pulmonary edema likely secondary to the above  Patient was seen and examined in conjunction with Mrs. Dyanne Carrel. General: Well developed, well nourished, mild respiratory distress, currently on BiPAP Cardiovascular: Regular rate and rhythm no murmurs rubs or gallops noted Respiratory: Tachypneic, diminished breath sounds, no wheezing or rhonchi noted  Again patient will be transferred to Step Down Unit at [facility]. Report  called to Dr. Verneita Griffes.    Total patient care time: 65 minutes  Ester Hilley D.O. on 06/21/2014 at [place]  [phone]

## 2014-06-21 NOTE — Consult Note (Signed)
Randy [name] MRN: [id number] DOB/AGE: 01-Jun-1932 80 y.o. Primary Care 32 M, MD Admit date: 06/21/2014 Chief Complaint:  Chief Complaint  Patient presents with  . Shortness of Breath   HPI: Pt is 80 year African American male with with past medical of ESRD who presented to Er with c/o Dyspnea.  HPI dates back to this am when pt had sudden onset of dyspnea,progressive . Pt said his Dyspnea was worsening and so he came to Er .  I ER pt was found to be hypoxic and tachypneic and was started on BiPAP. Pt now off bipap. Pt also gave hx of cough. Pt also c/o orthopnea. Pt c/o occasional nausea  And vomiting NO c/o blood in emesis.    Past Medical History  Diagnosis Date  . Hypertensive heart disease   . Hypercholesterolemia   . Gout   . Secondary cardiomyopathy     LVEF 40-45%  . Anemia of chronic disease   . Hypertension   . CHF (congestive heart failure)   . Arthritis   . CKD (chronic kidney disease) stage 4, GFR 15-29 ml/min     MWF- Hemodialysis  . Peripheral vascular disease   . Shortness of breath   . Pneumonia   . Constipation   . History of blood transfusion         Family History  Problem Relation Age of Onset  . Arthritis    . Cancer    . Kidney disease    . Anesthesia problems Neg Hx   . Hypotension Neg Hx   . Malignant hyperthermia Neg Hx   . Pseudochol deficiency Neg Hx   . Cancer Sister   . Cancer Brother     colon  . Cancer Brother     colon    Social History:  reports that he quit smoking about 15 years ago. His smoking use included Cigarettes. He has a 30 pack-year smoking history. He quit smokeless tobacco use about 20 years ago. He reports that he does not drink alcohol or use illicit drugs.   Allergies: No Known Allergies   (Not in a hospital admission)     XFG:HWEXH from the symptoms mentioned above,there are no other symptoms referable to all systems reviewed.  Marland Kitchen amLODipine  10 mg Oral Daily  .  cloNIDine  0.2 mg Oral BID  . [START ON 06/22/2014] epoetin alfa  4,000 Units Intravenous Once per day on Mon Wed Fri  . hydrALAZINE  50 mg Oral TID  . labetalol  400 mg Oral BID      Physical Exam: Vital signs in last 24 hours: Temp:  [97.8 F (36.6 C)] 97.8 F (36.6 C) (09/29 1035) Pulse Rate:  [79-130] 81 (09/29 1609) Resp:  [16-28] 19 (09/29 1609) BP: (169-205)/(97-132) 169/97 mmHg (09/29 1609) SpO2:  [88 %-100 %] 100 % (09/29 1609) Weight:  [163 lb (73.936 kg)] 163 lb (73.936 kg) (09/29 1035) Weight change:     Intake/Output from previous day:       Physical Exam: General- pt is awake,alert, oriented to time place and person Resp- Mode REsp distress, Crackles + Decreased BS at bases CVS- S1S2 regular ij rate and rhythm GIT- BS+, soft, NT, ND, PD cath insitu. EXT- NO LE Edema, Cyanosis CNS- CN 2-12 grossly intact. Moving all 4 extremities Psych- normal mod and affect Access-  AVF+ bruit + thrill   Lab Results: CBC  Recent Labs  06/21/14 1046  WBC 7.2  HGB 9.9*  HCT 31.2*  PLT  359    BMET  Recent Labs  06/21/14 1046  NA 141  K 4.4  CL 98  CO2 27  GLUCOSE 146*  BUN 66*  CREATININE 12.30*  CALCIUM 9.7     Lab Results  Component Value Date   PTH 559.0* 06/24/2012   CALCIUM 9.7 06/21/2014   PHOS 3.6 10/12/2012      Impression: 1)Renal  ESRD on PD Pt willing to undergo HD sec to non availability on Peritoneal dialysis. Will start HD .   2)HTN BP not at goal Sec to compliance issues  Medication On Calcium Channel Blockers On Alpha and beta Blockers  On Vasodilators- On Central Acting Sympatholytics-  3)Anemia HGb at goal (9--11) Will keep on Epo  4)CKD Mineral-Bone Disorder PTH acceptable . Secondary Hyperparathyroidism present. Phosphorus at goal.   5)CHF- admitted with acute exacerbation Fluid overload Primary MD following  6)Electrolytes  Normokalemic NOrmonatremic   7)Acid base Co2 at goal     Plan:  Will  dialyze today  Will start lower blood flow as pt usually on PD and undergoing HD today. Pt will probably need HD again in am. Will keep on Epo.     Aeriel Boulay S 06/21/2014, 4:13 PM

## 2014-06-21 NOTE — Progress Notes (Signed)
Critical Troponin level 0.63 called to Mid-level. Received call and reported value to [facility].

## 2014-06-21 NOTE — H&P (Signed)
[facility] History and Physical  IFEANYI MICKELSON UDJ:[id number] DOB: [birth year] DOA: 06/21/2014  Referring physician:  PCP: [name], MD   Chief Complaint: sob  HPI: Randy [name] is a 80 y.o. male with a past medical history of chronic renal failure stage IV on peritoneal dialysis, hypertensive heart disease, secondary cardiomyopathy, peripheral vascular disease since emergency department with the chief complaint of sudden onset shortness of breath. Initial evaluation in the emergency department reveals acute respiratory failure likely related to accelerated hypertension with pulmonary edema.  Patient reports that over the last 2 weeks he has experienced intermittent anterior chest "burning". He states that this pain is intermittent and usually worse after eating. He states the pain is nonradiating. He indicates that each episode only lasts a couple of minutes. This morning he got up and went to meet some friends for breakfast. He developed sudden onset of moderate shortness of breath. Associated symptoms include nausea without vomiting. He denies headache diaphoresis syncope or near-syncope.   In the emergency department his proBNP is greater than 58,850, basic metabolic panel with creatinine of 12.30 is well above his documented range of 4-5. Chest x-ray yields congestive heart failure with interstitial edema and small left pleural effusion. There may be underlying COPD.  His blood pressure is elevated with a reading of 201/132 heart rate is elevated at 120 beats per minute, with respiratory rate of 28 and hypoxic and oxygen saturation level of 88% on room air. This provided with 40 mg of Lasix IV and a nitroglycerin drip as well as BiPAP. At the time of my exam he is much more comfortable with a respiratory rate of 16, heart rate of 79 and blood pressure of 182/99.    Review of Systems:  And point review of systems completed and all systems are negative except as indicated in  the history of present illness   Past Medical History  Diagnosis Date  . Hypertensive heart disease   . Hypercholesterolemia   . Gout   . Secondary cardiomyopathy     LVEF 40-45%  . Anemia of chronic disease   . Hypertension   . CHF (congestive heart failure)   . Arthritis   . CKD (chronic kidney disease) stage 4, GFR 15-29 ml/min     MWF- Hemodialysis  . Peripheral vascular disease   . Shortness of breath   . Pneumonia   . Constipation   . History of blood transfusion    Past Surgical History  Procedure Laterality Date  . Knee surgery  2007    Right knee, arthroscopy  . Back surgery  2004    lumbar  . Hemorrhoid surgery  40 years ago  . Ganglion cyst excision  01/03/2012    Procedure: REMOVAL GANGLION OF WRIST;  Surgeon: Scherry Ran, MD;  Location: [facility];  Service: General;  Laterality: Right;  . Colonscopy    . Av fistula placement  08/24/2012    Procedure: ARTERIOVENOUS (AV) FISTULA CREATION;  Surgeon: Rosetta Posner, MD;  Location: [facility];  Service: Vascular;  Laterality: Left;  . Insertion of dialysis catheter      right neck  . Insertion of dialysis catheter  10/19/2012    Procedure: INSERTION OF DIALYSIS CATHETER;  Surgeon: Rosetta Posner, MD;  Location: [facility] OR;  Service: Vascular;  Laterality: N/A;  REMOVE TEMPORARY CATH   Social History:  reports that he quit smoking about 15 years ago. His smoking use included Cigarettes. He has a 30 pack-year smoking  history. He quit smokeless tobacco use about 20 years ago. He reports that he does not drink alcohol or use illicit drugs.  No Known Allergies  Family History  Problem Relation Age of Onset  . Arthritis    . Cancer    . Kidney disease    . Anesthesia problems Neg Hx   . Hypotension Neg Hx   . Malignant hyperthermia Neg Hx   . Pseudochol deficiency Neg Hx   . Cancer Sister   . Cancer Brother     colon  . Cancer Brother     colon     Prior to Admission medications   Medication Sig Start Date End Date  Taking? Authorizing Provider  amLODipine (NORVASC) 10 MG tablet Take 1 tablet (10 mg total) by mouth daily. 10/13/12   [name], MD  amoxicillin-clavulanate (AUGMENTIN) 875-125 MG per tablet Take 1 tablet by mouth 2 (two) times daily. 10/13/12   [name], MD  calcitRIOL (ROCALTROL) 0.5 MCG capsule Take 1 capsule (0.5 mcg total) by mouth daily. 10/13/12   [name], MD  cloNIDine (CATAPRES) 0.2 MG tablet Take 1 tablet (0.2 mg total) by mouth 2 (two) times daily. 08/29/12   [name], MD  furosemide (LASIX) 40 MG tablet Take 1 tablet (40 mg total) by mouth daily. 10/13/12   [name], MD  hydrALAZINE (APRESOLINE) 50 MG tablet Take 1 tablet (50 mg total) by mouth 3 (three) times daily. 06/27/12   [name], MD  labetalol (NORMODYNE) 200 MG tablet Take 2 tablets (400 mg total) by mouth 2 (two) times daily. 07/17/12   Satira Sark, MD  Multiple Vitamin (MULITIVITAMIN WITH MINERALS) TABS Take 1 tablet by mouth daily.    Historical Provider, MD  simvastatin (ZOCOR) 80 MG tablet Take 80 mg by mouth at bedtime.      Historical Provider, MD   Physical Exam: Filed Vitals:   06/21/14 1036 06/21/14 1058 06/21/14 1110 06/21/14 1206  BP:   191/99 182/100  Pulse:  97 90 79  Temp:      TempSrc:      Resp:  20 17 16   Height:      Weight:      SpO2: 88%  100%     Wt Readings from Last 3 Encounters:  06/21/14 73.936 kg (163 lb)  10/19/12 71.8 kg (158 lb 4.6 oz)  10/19/12 71.8 kg (158 lb 4.6 oz)    General:  Appears calm and comfortable Eyes: PERRL, normal lids, irises & conjunctiva ENT: grossly normal hearing, lips & tongue Neck: no LAD, masses or thyromegaly Cardiovascular: RRR, HS distant,  no m/r/g. No LE edema.PPP   Respiratory: mild increased work of breathing with conversation on BiPap.. BS distant particularly on left base. i hear no crackle faint wheeze Abdomen: soft, ntnd +BS no guarding PD catheter without erythema at site. Skin: no  rash or induration seen on limited exam Musculoskeletal: grossly normal tone BUE/BLE Psychiatric: grossly normal mood and affect, speech fluent and appropriate Neurologic: grossly non-focal. Facial symetry          Labs on Admission:  Basic Metabolic Panel:  Recent Labs Lab 06/21/14 1046  NA 141  K 4.4  CL 98  CO2 27  GLUCOSE 146*  BUN 66*  CREATININE 12.30*  CALCIUM 9.7   Liver Function Tests: No results found for this basename: AST, ALT, ALKPHOS, BILITOT, PROT, ALBUMIN,  in the last 168 hours No results found for this basename:  LIPASE, AMYLASE,  in the last 168 hours No results found for this basename: AMMONIA,  in the last 168 hours CBC:  Recent Labs Lab 06/21/14 1046  WBC 7.2  NEUTROABS 5.0  HGB 9.9*  HCT 31.2*  MCV 99.7  PLT 359   Cardiac Enzymes:  Recent Labs Lab 06/21/14 1046  TROPONINI <0.30    BNP (last 3 results)  Recent Labs  06/21/14 1046  PROBNP >70000.0*   CBG: No results found for this basename: GLUCAP,  in the last 168 hours  Radiological Exams on Admission: Dg Chest Portable 1 View  06/21/2014   CLINICAL DATA:  Severe shortness of breath since yesterday. , also cough ; history of CHF and hypertension and chronic renal insufficiency  EXAM: PORTABLE CHEST - 1 VIEW  COMPARISON:  PA and lateral chest x-ray of March 28, 2014  FINDINGS: The lungs are mildly hyperinflated. There is increased pleural fluid on the left. The pulmonary vascularity is engorged and the interstitial markings are increased bilaterally. The cardiopericardial silhouette is enlarged. There is mild tortuosity of the descending thoracic aorta. The bony thorax exhibits no acute abnormality.  IMPRESSION: Congestive heart failure with interstitial edema and small left pleural effusion. There may be underlying COPD.   Electronically Signed   By: David  Martinique   On: 06/21/2014 10:58    EKG: pending  Assessment/Plan Principal Problem:   Acute respiratory failure: related to acute  on chronic diastolic HF in setting of accelerated HTN.  With 2 week hx intermittent chest "burning".  Will admit to [facility] on bipap. Will continue NTG gtt and IV lasix. Will cycle troponin, get serial EKG. Will also request 2decho for LV evaluation. Of note echo 7/15 with EF 45%, moderate LVH and grade 1 diastolic dysfunction.  Will request nephrology consult as patient is peritoniel dialysis patient. Chest xray without indication infiltrate will hold off on antibiotics Active Problems: Acute pulmonary edema: likely related to accelerated HTN. Reports compliance with medications. Continue lasix IV. Await nephrology consult for possible dialysis.   Acute on chronic diastolic heart failure: trigger unclear but likely accelerated HTN. Will continue IV lasix and request nephrology consult for possible dialysis. Home medication include labetalol. Will hold this for now. Monitor intake and output and daily weights.  Accelerated hypertension: hx of same. Home meds include norvasc, clonidine, lasix, apresoline and labetalol. Improved control on NTG drip.     HYPERLIPIDEMIA: obtain lipid panel    Hypertensive heart disease: see # 2.     End stage renal disease: patient does daily peritoneal dialysis. Nephrology consult requested.       Anemia of chronic disease: appears stable at baseline. No s/sx bleeding     Code Status: full DVT Prophylaxis: Family Communication: wife at bedside Disposition Plan: home when ready  Time spent: 5 minutes  [facility]

## 2014-06-21 NOTE — Procedures (Signed)
   EMERGENT HEMODIALYSIS TREATMENT NOTE:  3 hour hemodialysis session ordered for fluid overloaded PD patient on Bi-PAP.  HD initiated after access prepped with lidocaine ID.  O2 weaned to room air during course of treatment.  C/o leg cramps about 2.5 hours into session.  Cramps unrelieved after 200cc NS bolus.  All blood was reinfused and pt was assisted to stand and bear weight to relieve cramps.  Situation was discussed with Dr. Theador Hawthorne.  Order received to end HD at this point:  2.5 hours and 2.2 liters ultrafiltrate.  Hemostasis was achieved within 10 minutes.  Tammara Massing L. Shashana Fullington, RN, CDN

## 2014-06-21 NOTE — Progress Notes (Signed)
[facility] for Heparin Indication: ACS/STEMI  No Known Allergies  Patient Measurements: Height: 5\' 9"  (175.3 cm) Weight: 165 lb 12.6 oz (75.2 kg) IBW/kg (Calculated) : 70.7 Heparin Dosing Weight: 75.2 KG  Vital Signs: Temp: 98 F (36.7 C) (09/29 1945) Temp src: Oral (09/29 1945) BP: 132/75 mmHg (09/29 2100) Pulse Rate: 88 (09/29 2100)  Labs:  Recent Labs  06/21/14 1046 06/21/14 1855  HGB 9.9*  --   HCT 31.2*  --   PLT 359  --   CREATININE 12.30*  --   TROPONINI <0.30 0.63*    Estimated Creatinine Clearance: 4.8 ml/min (by C-G formula based on Cr of 12.3).   Medical History: Past Medical History  Diagnosis Date  . Hypertensive heart disease   . Hypercholesterolemia   . Gout   . Secondary cardiomyopathy     LVEF 40-45%  . Anemia of chronic disease   . Hypertension   . CHF (congestive heart failure)   . Arthritis   . CKD (chronic kidney disease) stage 4, GFR 15-29 ml/min     MWF- Hemodialysis  . Peripheral vascular disease   . Shortness of breath   . Pneumonia   . Constipation   . History of blood transfusion     Medications:  Scheduled:  . amLODipine  10 mg Oral Daily  . aspirin  325 mg Oral Daily  . atorvastatin  40 mg Oral q1800  . calcitRIOL  0.5 mcg Oral Daily  . cloNIDine  0.2 mg Oral BID  . [START ON 06/22/2014] epoetin alfa  4,000 Units Intravenous Once per day on Mon Wed Fri  . furosemide  40 mg Intravenous Q12H  . gabapentin  200 mg Oral Once  . hydrALAZINE  50 mg Oral TID  . [START ON 06/22/2014] insulin aspart  0-9 Units Subcutaneous TID WC  . labetalol  400 mg Oral BID  . rOPINIRole  0.25 mg Oral QHS  . sodium chloride  3 mL Intravenous Q12H    Assessment: Okay for Protocol, no bleeding noted.  Baseline coag labs pending.  Received Heparin during HD.  Elevated troponin noted and Heparin to be started for ACS/STEMI R/O.  Goal of Therapy:  Heparin level 0.3-0.7 units/ml Monitor platelets by  anticoagulation protocol: Yes   Plan:  Give 2000 units bolus x 1 Start heparin infusion at 900 units/hr Check anti-Xa level in 6-8 hours and daily while on heparin Continue to monitor H&H and platelets  Pricilla Larsson 06/21/2014,9:30 PM

## 2014-06-22 DIAGNOSIS — I359 Nonrheumatic aortic valve disorder, unspecified: Secondary | ICD-10-CM

## 2014-06-22 DIAGNOSIS — I5043 Acute on chronic combined systolic (congestive) and diastolic (congestive) heart failure: Secondary | ICD-10-CM

## 2014-06-22 LAB — COMPREHENSIVE METABOLIC PANEL
ALBUMIN: 2.7 g/dL — AB (ref 3.5–5.2)
ALT: 16 U/L (ref 0–53)
ANION GAP: 14 (ref 5–15)
AST: 25 U/L (ref 0–37)
Alkaline Phosphatase: 52 U/L (ref 39–117)
BUN: 45 mg/dL — AB (ref 6–23)
CO2: 27 mEq/L (ref 19–32)
Calcium: 9 mg/dL (ref 8.4–10.5)
Chloride: 100 mEq/L (ref 96–112)
Creatinine, Ser: 9.4 mg/dL — ABNORMAL HIGH (ref 0.50–1.35)
GFR calc non Af Amer: 5 mL/min — ABNORMAL LOW (ref >90)
GFR, EST AFRICAN AMERICAN: 5 mL/min — AB (ref >90)
Glucose, Bld: 98 mg/dL (ref 70–99)
POTASSIUM: 3.9 meq/L (ref 3.7–5.3)
Sodium: 141 mEq/L (ref 137–147)
TOTAL PROTEIN: 6.3 g/dL (ref 6.0–8.3)
Total Bilirubin: 0.4 mg/dL (ref 0.3–1.2)

## 2014-06-22 LAB — URINALYSIS, ROUTINE W REFLEX MICROSCOPIC
BILIRUBIN URINE: NEGATIVE
Glucose, UA: 100 mg/dL — AB
Leukocytes, UA: NEGATIVE
NITRITE: NEGATIVE
PROTEIN: 100 mg/dL — AB
SPECIFIC GRAVITY, URINE: 1.015 (ref 1.005–1.030)
Urobilinogen, UA: 0.2 mg/dL (ref 0.0–1.0)
pH: 6 (ref 5.0–8.0)

## 2014-06-22 LAB — TROPONIN I
Troponin I: 0.73 ng/mL (ref <0.30)
Troponin I: 0.64 ng/mL (ref <0.30)
Troponin I: 0.53 ng/mL (ref <0.30)

## 2014-06-22 LAB — HEMOGLOBIN A1C
Hgb A1c MFr Bld: 5.7 % — ABNORMAL HIGH (ref <5.7)
MEAN PLASMA GLUCOSE: 117 mg/dL — AB (ref <117)

## 2014-06-22 LAB — CBC
HEMATOCRIT: 27.3 % — AB (ref 39.0–52.0)
HEMOGLOBIN: 8.6 g/dL — AB (ref 13.0–17.0)
MCH: 31.2 pg (ref 26.0–34.0)
MCHC: 31.5 g/dL (ref 30.0–36.0)
MCV: 98.9 fL (ref 78.0–100.0)
Platelets: 284 10*3/uL (ref 150–400)
RBC: 2.76 MIL/uL — ABNORMAL LOW (ref 4.22–5.81)
RDW: 17.1 % — ABNORMAL HIGH (ref 11.5–15.5)
WBC: 5.5 10*3/uL (ref 4.0–10.5)

## 2014-06-22 LAB — GLUCOSE, CAPILLARY
Glucose-Capillary: 113 mg/dL — ABNORMAL HIGH (ref 70–99)
Glucose-Capillary: 104 mg/dL — ABNORMAL HIGH (ref 70–99)
Glucose-Capillary: 89 mg/dL (ref 70–99)
Glucose-Capillary: 131 mg/dL — ABNORMAL HIGH (ref 70–99)

## 2014-06-22 LAB — TSH: TSH: 2.06 u[IU]/mL (ref 0.350–4.500)

## 2014-06-22 LAB — URINE MICROSCOPIC-ADD ON

## 2014-06-22 LAB — HEPARIN LEVEL (UNFRACTIONATED)
HEPARIN UNFRACTIONATED: 0.25 [IU]/mL — AB (ref 0.30–0.70)
Heparin Unfractionated: 0.1 [IU]/mL — ABNORMAL LOW (ref 0.30–0.70)
Heparin Unfractionated: 0.11 IU/mL — ABNORMAL LOW (ref 0.30–0.70)

## 2014-06-22 LAB — HEPATITIS B SURFACE ANTIGEN: Hepatitis B Surface Ag: NEGATIVE

## 2014-06-22 MED ORDER — HEPARIN BOLUS VIA INFUSION
2000.0000 [IU] | Freq: Once | INTRAVENOUS | Status: AC
  Administered 2014-06-22: 2000 [IU] via INTRAVENOUS
  Filled 2014-06-22: qty 2000

## 2014-06-22 MED ORDER — ASPIRIN EC 81 MG PO TBEC
81.0000 mg | DELAYED_RELEASE_TABLET | Freq: Every day | ORAL | Status: DC
  Administered 2014-06-23: 81 mg via ORAL
  Filled 2014-06-22: qty 1

## 2014-06-22 MED ORDER — LABETALOL HCL 200 MG PO TABS
400.0000 mg | ORAL_TABLET | Freq: Once | ORAL | Status: AC
  Administered 2014-06-22: 400 mg via ORAL
  Filled 2014-06-22: qty 2

## 2014-06-22 MED ORDER — ATORVASTATIN CALCIUM 40 MG PO TABS
80.0000 mg | ORAL_TABLET | Freq: Every day | ORAL | Status: DC
  Administered 2014-06-22: 80 mg via ORAL
  Filled 2014-06-22: qty 2

## 2014-06-22 MED ORDER — HEPARIN SODIUM (PORCINE) 5000 UNIT/ML IJ SOLN
5000.0000 [IU] | Freq: Three times a day (TID) | INTRAMUSCULAR | Status: DC
  Administered 2014-06-22 – 2014-06-23 (×2): 5000 [IU] via SUBCUTANEOUS
  Filled 2014-06-22 (×2): qty 1

## 2014-06-22 NOTE — Progress Notes (Signed)
UR chart review completed.  

## 2014-06-22 NOTE — Progress Notes (Signed)
ANTICOAGULATION CONSULT NOTE  Pharmacy Consult for Heparin Indication: ACS/STEMI  No Known Allergies  Patient Measurements: Height: 5\' 9"  (175.3 cm) Weight: 159 lb 13.3 oz (72.5 kg) IBW/kg (Calculated) : 70.7 Heparin Dosing Weight: 75.2 KG  Vital Signs: Temp: 98.4 F (36.9 C) (09/30 1227) Temp src: Oral (09/30 1227) BP: 149/81 mmHg (09/30 1200) Pulse Rate: 80 (09/30 1200)  Labs:  Recent Labs  06/21/14 1046  06/21/14 2127 06/21/14 2137 06/22/14 0337 06/22/14 0819 06/22/14 1407  HGB 9.9*  --   --   --  8.6*  --   --   HCT 31.2*  --   --   --  27.3*  --   --   PLT 359  --   --   --  284  --   --   LABPROT  --   --   --  13.3  --   --   --   INR  --   --   --  1.01  --   --   --   HEPARINUNFRC  --   --   --   --  0.10*  --  0.25*  CREATININE 12.30*  --   --   --  9.40*  --   --   TROPONINI <0.30  < > 0.73*  --  0.73* 0.64*  --   < > = values in this interval not displayed. Estimated Creatinine Clearance: 6.3 ml/min (by C-G formula based on Cr of 9.4).  Medical History: Past Medical History  Diagnosis Date  . Hypertensive heart disease   . Hypercholesterolemia   . Gout   . Secondary cardiomyopathy     LVEF 40-45%  . Anemia of chronic disease   . Hypertension   . CHF (congestive heart failure)   . Arthritis   . CKD (chronic kidney disease) stage 4, GFR 15-29 ml/min     MWF- Hemodialysis  . Peripheral vascular disease   . Shortness of breath   . Pneumonia   . Constipation   . History of blood transfusion    Medications:  Scheduled:  . amLODipine  10 mg Oral Daily  . antiseptic oral rinse  7 mL Mouth Rinse BID  . aspirin  325 mg Oral Daily  . atorvastatin  80 mg Oral q1800  . calcitRIOL  0.5 mcg Oral Daily  . cloNIDine  0.2 mg Oral BID  . epoetin alfa  4,000 Units Intravenous Once per day on Mon Wed Fri  . furosemide  40 mg Intravenous Q12H  . hydrALAZINE  50 mg Oral TID  . insulin aspart  0-9 Units Subcutaneous TID WC  . labetalol  400 mg Oral BID  .  rOPINIRole  0.25 mg Oral QHS  . sodium chloride  3 mL Intravenous Q12H   Assessment: Okay for Protocol, no bleeding noted.  Received Heparin during HD 9/29.  Elevated troponin noted and Heparin started for ACS/STEMI  Initial Heparin level below goal.  H/H trending down some.  Goal of Therapy:  Heparin level 0.3-0.7 units/ml Monitor platelets by anticoagulation protocol: Yes   Plan:  Increase Heparin infusion to 1500 units/hr Recheck Heparin level in 6 hours Daily Heparin level while on Heparin CBC daily while on Heparin  Randy [name], Randy [name] 06/22/2014,2:34 PM

## 2014-06-22 NOTE — Progress Notes (Signed)
CRITICAL VALUE ALERT  Critical value received:  Troponin 0.73   Date of notification:  06/21/14  Time of notification:  2305  Critical value read back: yes  Nurse who received alert:  Candiss Norse RN  MD notified (1st page):  Raliegh Ip Schorr  Time of first page:  2306  MD notified (2nd page):  Time of second page:  Responding MD:  Lamar Blinks  Time MD responded:  [id number]  Monitor for now currently on heparin gtt

## 2014-06-22 NOTE — Progress Notes (Signed)
Echo Lab  2D Echocardiogram completed.  [place], RDCS 06/22/2014 11:58 AM

## 2014-06-22 NOTE — Progress Notes (Signed)
Patient admitted with accelerated hypertension, systolic CHF end-stage renal disease on peritoneal dialysis by choice which is now resolving with serial elevated troponins NOSSON WENDER IOE:[id number] DOB: Feb 19, 1932 DOA: 06/21/2014 PCP: [name], MD             Physical Exam: Blood pressure 149/81, pulse 80, temperature 98.4 F (36.9 C), temperature source Oral, resp. rate 14, height 5\' 9"  (1.753 m), weight 159 lb 13.3 oz (72.5 kg), SpO2 100.00%. lungs show diminished breath sounds in the bases scattered rhonchi no rales appreciable neck shows no JVD no carotid bruits heart regular rhythm no S3-S4 no heaves rubs traced 1+ pedal edema   Investigations:  Recent Results (from the past 240 hour(s))  MRSA PCR SCREENING     Status: None   Collection Time    06/21/14  5:50 PM      Result Value Ref Range Status   MRSA by PCR NEGATIVE  NEGATIVE Final   Comment:            The GeneXpert MRSA Assay (FDA     approved for NASAL specimens     only), is one component of a     comprehensive MRSA colonization     surveillance program. It is not     intended to diagnose MRSA     infection nor to guide or     monitor treatment for     MRSA infections.     Basic Metabolic Panel:  Recent Labs  06/21/14 1046 06/22/14 0337  NA 141 141  K 4.4 3.9  CL 98 100  CO2 27 27  GLUCOSE 146* 98  BUN 66* 45*  CREATININE 12.30* 9.40*  CALCIUM 9.7 9.0   Liver Function Tests:  Recent Labs  06/22/14 0337  AST 25  ALT 16  ALKPHOS 52  BILITOT 0.4  PROT 6.3  ALBUMIN 2.7*     CBC:  Recent Labs  06/21/14 1046 06/22/14 0337  WBC 7.2 5.5  NEUTROABS 5.0  --   HGB 9.9* 8.6*  HCT 31.2* 27.3*  MCV 99.7 98.9  PLT 359 284    Dg Chest Portable 1 View  06/21/2014   CLINICAL DATA:  Severe shortness of breath since yesterday. , also cough ; history of CHF and hypertension and chronic renal insufficiency  EXAM: PORTABLE CHEST - 1 VIEW  COMPARISON:  PA and lateral chest x-ray of  March 28, 2014  FINDINGS: The lungs are mildly hyperinflated. There is increased pleural fluid on the left. The pulmonary vascularity is engorged and the interstitial markings are increased bilaterally. The cardiopericardial silhouette is enlarged. There is mild tortuosity of the descending thoracic aorta. The bony thorax exhibits no acute abnormality.  IMPRESSION: Congestive heart failure with interstitial edema and small left pleural effusion. There may be underlying COPD.   Electronically Signed   By: David  Martinique   On: 06/21/2014 10:58      Medications:   Impression: Anemia of chronic disease Principal Problem:   Acute respiratory failure Active Problems:   HYPERLIPIDEMIA   Hypertensive heart disease   End stage renal disease   Acute pulmonary edema   Accelerated hypertension   Anemia of chronic disease     Plan: Cardiology consultation regarding elevated troponins. Continue hemodialysis and Reduction intravascularly. Continue antihypertensive regimen and anti-lipid regimen as well as heparin. 2-D echo 4 evaluation of systolic function  Consultants: Nephrology and cardiology   Procedures hemodialysis   Antibiotics:  Code Status: Full   Family Communication:  Discussed with family at bedside  Disposition Plan   Time spent: 30 minutes   LOS: 1 day   Melea Prezioso M   06/22/2014, 12:48 PM

## 2014-06-22 NOTE — Consult Note (Signed)
CARDIOLOGY CONSULT NOTE   Patient ID: Randy [name] MRN: [id number] DOB/AGE: [birth year] 78 y.o.  Admit Date: 06/21/2014 Referring Physician: Lorriane Shire MD Primary Physician: [name], MD Consulting Cardiologist: Carlyle Dolly MD Primary Cardiologist: Rozann Lesches MD Reason for Consultation: Hypertension, Positive Troponin.   Clinical Summary Randy [name] is a 80 y.o.male with known history of systolic dysfunction, EF of 40-45% per echo in 03/2014, suspected NICM, hypertension, ESRD, who presented to ER with hypertensive urgency BP 1201/132 .   He states that he usually does home peritoneal dialysis overnight. He did so the night prior to admission. The next morning he was feeling a little sluggish.He had been having some mild orthopnea, recently.  Went out to breakfast eating ham, biscuits and hash browns at a [employer] He began to feel badly and have severe shortness of breath when returning to his car. He came to ER instead of going home.   In ER he was found to be hypoxic and placed on BiPAP. labs revealed PRO-BNP >70000. Creatinine 12.30. He was found to be anemic. Hgb 8.6/Hct 27.3. CXR demonstrated CHF with interstitial edema and small left pleural effusion. EKG demonstrated sinus tachycardia HR 116 bpm. with LVH, and mild ST depression in the lateral leads.  He was treated with furosemide 40 mg IV, nitroglycerine gtt, hydralazine, and labetalol. BP decreased to 162/97. Initial troponin <0.30, with follow up troponin 0.63, 0.73, 0.73; and 0.64 respectively.    He has had nephrology consult and has had hemodialysis via an existing fistula to the upper left forarem, placed by Dr. Donnetta Hutching in January of 2014, but never used until now, per patient. I/O document -3.9 liters. He is breathing and feeling much better. He is now on room air and off of NTG gtt with normalization of BP.   No Known Allergies  Medications Scheduled Medications: . amLODipine  10 mg Oral Daily   . antiseptic oral rinse  7 mL Mouth Rinse BID  . aspirin  325 mg Oral Daily  . atorvastatin  80 mg Oral q1800  . calcitRIOL  0.5 mcg Oral Daily  . cloNIDine  0.2 mg Oral BID  . epoetin alfa  4,000 Units Intravenous Once per day on Mon Wed Fri  . furosemide  40 mg Intravenous Q12H  . hydrALAZINE  50 mg Oral TID  . insulin aspart  0-9 Units Subcutaneous TID WC  . labetalol  400 mg Oral BID  . rOPINIRole  0.25 mg Oral QHS  . sodium chloride  3 mL Intravenous Q12H    Infusions: . heparin 1,250 Units/hr (06/22/14 0740)  . nitroGLYCERIN Stopped (06/21/14 1930)    PRN Medications: sodium chloride, sodium chloride, sodium chloride, acetaminophen, acetaminophen, alum & mag hydroxide-simeth, feeding supplement (NEPRO CARB STEADY), HYDROcodone-acetaminophen, lidocaine (PF), lidocaine-prilocaine, ondansetron (ZOFRAN) IV, ondansetron, pentafluoroprop-tetrafluoroeth, senna-docusate, sodium chloride   Past Medical History  Diagnosis Date  . Hypertensive heart disease   . Hypercholesterolemia   . Gout   . Secondary cardiomyopathy     LVEF 40-45%  . Anemia of chronic disease   . Hypertension   . CHF (congestive heart failure)   . Arthritis   . CKD (chronic kidney disease) stage 4, GFR 15-29 ml/min     MWF- Hemodialysis  . Peripheral vascular disease   . Shortness of breath   . Pneumonia   . Constipation   . History of blood transfusion     Past Surgical History  Procedure Laterality Date  . Knee surgery  2007  Right knee, arthroscopy  . Back surgery  2004    lumbar  . Hemorrhoid surgery  40 years ago  . Ganglion cyst excision  01/03/2012    Procedure: REMOVAL GANGLION OF WRIST;  Surgeon: Scherry Ran, MD;  Location: [facility];  Service: General;  Laterality: Right;  . Colonscopy    . Av fistula placement  08/24/2012    Procedure: ARTERIOVENOUS (AV) FISTULA CREATION;  Surgeon: Rosetta Posner, MD;  Location: [place];  Service: Vascular;  Laterality: Left;  . Insertion of dialysis  catheter      right neck  . Insertion of dialysis catheter  10/19/2012    Procedure: INSERTION OF DIALYSIS CATHETER;  Surgeon: Rosetta Posner, MD;  Location: [facility];  Service: Vascular;  Laterality: N/A;  REMOVE TEMPORARY CATH    Family History  Problem Relation Age of Onset  . Arthritis    . Cancer    . Kidney disease    . Anesthesia problems Neg Hx   . Hypotension Neg Hx   . Malignant hyperthermia Neg Hx   . Pseudochol deficiency Neg Hx   . Cancer Sister   . Cancer Brother     colon  . Cancer Brother     colon    Social History Randy [name] reports that he quit smoking about 15 years ago. His smoking use included Cigarettes. He has a 30 pack-year smoking history. He quit smokeless tobacco use about 20 years ago. Randy [name] reports that he does not drink alcohol.  Review of Systems Otherwise reviewed and negative except as outlined.  Physical Examination Blood pressure 149/81, pulse 80, temperature 98.4 F (36.9 C), temperature source Oral, resp. rate 14, height 5\' 9"  (1.753 m), weight 159 lb 13.3 oz (72.5 kg), SpO2 100.00%.  Intake/Output Summary (Last 24 hours) at 06/22/14 1409 Last data filed at 06/22/14 1135  Gross per 24 hour  Intake  344.6 ml  Output   4272 ml  Net -3927.4 ml    Telemetry: NSR rate 70-80's,  GEN: Resting comfortably no acute distress.  HEENT: Conjunctiva and lids normal, oropharynx clear with moist mucosa. Neck: Supple, no elevated JVP or carotid bruits, no thyromegaly. Lungs: Diminished in the bases, no wheezes, nonlabored breathing at rest. Cardiac: Regular rate and rhythm, 2.6 systolic murmur.  Abdomen: Soft, nontender, no hepatomegaly, bowel sounds present, no guarding or rebound. Peritoneal dialysis catheter to abdomen. No evidence of infection or bleeding.  Extremities: No pitting edema, distal pulses 2+.Fistula with good thrill to the left forearm.  Skin: Warm and dry. Musculoskeletal: No kyphosis. Neuropsychiatric: Alert and oriented x3,  affect grossly appropriate.  Prior Cardiac Testing/Procedures 1. Echocardiogram 03/28/2014 Left ventricle: The cavity size was normal. Wall thickness was increased in a pattern of moderate LVH. Systolic function was mildly reduced. The estimated ejection fraction was approximately 45%. Global hypokinesis noted. Doppler parameters are consistent with abnormal left ventricular relaxation (grade 1 diastolic dysfunction). Doppler parameters are consistent with high ventricular filling pressure. - Regional wall motion abnormality: Mild hypokinesis of the mid anterior, mid anteroseptal, and mid inferoseptal myocardium. - Aortic valve: Mildly calcified annulus. Mildly thickened leaflets. Mild aortic stenosis. There was mild regurgitation. Peak velocity (S): 256 cm/s. Mean gradient 11 mmHg. Valve area (VTI): 1.56 cm^2. Valve area (Vmax): 1.39 cm^2. - Mitral valve: Mildly calcified annulus. Mildly thickened leaflets . There was mild regurgitation. - Left atrium: The atrium was moderately dilated. - Right atrium: The atrium was mildly dilated. - Tricuspid valve: There was mild regurgitation. -  Pulmonary arteries: PA peak pressure: 41 mm Hg (S). Mildly elevated pulmonary pressures.   Lab Results  Basic Metabolic Panel:  Recent Labs Lab 06/21/14 1046 06/22/14 0337  NA 141 141  K 4.4 3.9  CL 98 100  CO2 27 27  GLUCOSE 146* 98  BUN 66* 45*  CREATININE 12.30* 9.40*  CALCIUM 9.7 9.0    Liver Function Tests:  Recent Labs Lab 06/22/14 0337  AST 25  ALT 16  ALKPHOS 52  BILITOT 0.4  PROT 6.3  ALBUMIN 2.7*    CBC:  Recent Labs Lab 06/21/14 1046 06/22/14 0337  WBC 7.2 5.5  NEUTROABS 5.0  --   HGB 9.9* 8.6*  HCT 31.2* 27.3*  MCV 99.7 98.9  PLT 359 284    Cardiac Enzymes:  Recent Labs Lab 06/21/14 1046 06/21/14 1855 06/21/14 2127 06/22/14 0337 06/22/14 0819  TROPONINI <0.30 0.63* 0.73* 0.73* 0.64*    BNP: >70000  Radiology: Dg Chest Portable 1  View  06/21/2014   CLINICAL DATA:  Severe shortness of breath since yesterday. , also cough ; history of CHF and hypertension and chronic renal insufficiency  EXAM: PORTABLE CHEST - 1 VIEW  COMPARISON:  PA and lateral chest x-ray of March 28, 2014  FINDINGS: The lungs are mildly hyperinflated. There is increased pleural fluid on the left. The pulmonary vascularity is engorged and the interstitial markings are increased bilaterally. The cardiopericardial silhouette is enlarged. There is mild tortuosity of the descending thoracic aorta. The bony thorax exhibits no acute abnormality.  IMPRESSION: Congestive heart failure with interstitial edema and small left pleural effusion. There may be underlying COPD.   Electronically Signed   By: David  Martinique   On: 06/21/2014 10:58     ECG: Sinus tachycardia with LVH and nos-specific T-wave abnormalities in the lateral leads.    Impression and Recommendations  1. Demand Ischemia: Patient with hypoxic respiratory failure in the setting of systolic CHF and renal failure with elevation in troponin. Cardiac marker elevation likely multifactorial in this setting. Markers are trending down with hemodynamic and respiratory stability. Does not appear to be an ischemic event.   2. Hypertensive Urgency: He admits to eating salty foods at home, but is medically compliant with antihypertensives. Now much better controlled and off of nitroglycerine gtt. Echo is pending. Continue clonidine, amlodipine, and hydralazine. BP is low normal while supine in the bed.   3. Systolic Dysfunction with CHF: He has had 3.9 liter output with IV lasix and hemodialysis. Breath sounds are still diminished in the bases, but no overt edema is noted. He approaching compensated state. Do not see that he is on BB. Consider low dose, cautiously while using clonidine to avoid hypotension and bradycardia. Uncertain of dry wt.   4. Renal failure: Usually has PD at home. Creatinine improved from 12.30 on  admission to 9.40 this am, with removal of 3. Liters of fluid. He states that he is non-oliguric, with some urination during the day. Therapy per nephrology.   5. Anemia: Chronic with renal failure. On EPO. Signed: [name] NP  06/22/2014, 2:09 PM Co-Sign MD  Patient seen and discussed with NP Purcell Nails, agree with her documentation above. 80 yo male hx of chronic systolic HF LVEF 67-20%, HTN, ESRD admitted with acute on chronic systolic HF likely due to dietary non-compliance as well as severely elevated blood pressures with SBP in the 200s. BP has currently normalized.   TSH 2, pro BNP >70000, K 4.4, Cr 12, Hgb 9.9, Trop  peak 0.73 EKG SR with LVH and strain pattern that has been seen in prior EKGs CXR pulm edema Echo LVEF 15-17%, grade I diastolic dysfunction, mild to moderate AS.   Acute on chronic systolic/diastolic HF in setting of dietary non-compliance, suspected medication non-compliance due to severely elevated bp's, and questionable peritoneal HD use given his severely elevated Cr and volume overload at admission. Demand ischemia in setting of severe systemic issues described above, mild trop trending down. Echo overall stable without significant new WMAs. Describes chest pain symptoms at times worst with eating most suggestive of GI etiology. With blood pressure control and fluid removal all symptoms have resolved. No urgent indication for ischemic testing at this time. Presentation not consistent with acute occlusive CAD, but rather demand ischemia. He has previously been seen by Dr Domenic Polite in clinic, and I will defer decision for potential stress testing in the future to him. Can consider changing labetalol to coreg in setting of systolic dysfunction, consider ACE-I. BP controlled on current regimen, will defer long term therapy plans to his primary cardiologist. He has been on heparin since last night, presentation not consistent with ACS, will discontinue, start subQ heparin  for DVT prophylaxis.   Zandra Abts MD

## 2014-06-22 NOTE — Procedures (Signed)
  HEMODIALYSIS TREATMENT NOTE:  3.5 hour HD ordered with goal of 2-3 liters.  Pt unable to tolerate ultrafiltration beyond 2 liters d/t severe leg cramps.  Cramps only relieved with NS administration and weight-bearing stretches.  HD discontinued at pt's request after 3 hours.  All blood was reinfused and hemostasis was achieved within 10 minutes.  Report given to Deno Etienne, RN  [place] Ambriella Kitt, RN, CDN

## 2014-06-22 NOTE — Progress Notes (Signed)
Randy [name]  MRN: [id number]  DOB/AGE: 03/06/1932 80 y.o.  Primary Care 80 M, MD  Admit date: 06/21/2014  Chief Complaint:  Chief Complaint  Patient presents with  . Shortness of Breath    S-Pt presented on  06/21/2014 with  Chief Complaint  Patient presents with  . Shortness of Breath  .    Pt today feels better    Pt seen on HD    Pt tolerating tx well.   Meds . amLODipine  10 mg Oral Daily  . antiseptic oral rinse  7 mL Mouth Rinse BID  . aspirin  325 mg Oral Daily  . atorvastatin  40 mg Oral q1800  . calcitRIOL  0.5 mcg Oral Daily  . cloNIDine  0.2 mg Oral BID  . epoetin alfa  4,000 Units Intravenous Once per day on Mon Wed Fri  . furosemide  40 mg Intravenous Q12H  . hydrALAZINE  50 mg Oral TID  . insulin aspart  0-9 Units Subcutaneous TID WC  . labetalol  400 mg Oral BID  . rOPINIRole  0.25 mg Oral QHS  . sodium chloride  3 mL Intravenous Q12H       Physical Exam: Vital signs in last 24 hours: Temp:  [97.6 F (36.4 C)-98.7 F (37.1 C)] 98.2 F (36.8 C) (09/30 0817) Pulse Rate:  [68-130] 76 (09/30 0900) Resp:  [10-28] 22 (09/30 0900) BP: (85-205)/(54-132) 145/72 mmHg (09/30 0900) SpO2:  [88 %-100 %] 97 % (09/30 0815) Weight:  [159 lb 13.3 oz (72.5 kg)-165 lb 12.6 oz (75.2 kg)] 159 lb 13.3 oz (72.5 kg) (09/30 0815) Weight change:  Last BM Date: 06/20/14  Intake/Output from previous day: 09/29 0701 - 09/30 0700 In: 344.6 [P.O.:210; I.V.:134.6] Out: 2270 [Urine:25]     Physical Exam: General- pt is awake,alert, oriented to time place and person  Resp- NO REsp distress, NO Crackles  Heard today, Decreased BS at bases  CVS- S1S2 regular in rate and rhythm  GIT- BS+, soft, NT, ND, PD cath insitu.  EXT- NO LE Edema, Cyanosis  Access- AVF+ Two needles in situ     Lab Results: CBC  Recent Labs  06/21/14 1046 06/22/14 0337  WBC 7.2 5.5  HGB 9.9* 8.6*  HCT 31.2* 27.3*  PLT 359 284    BMET  Recent Labs  06/21/14 1046 06/22/14 0337  NA 141 141  K 4.4 3.9  CL 98 100  CO2 27 27  GLUCOSE 146* 98  BUN 66* 45*  CREATININE 12.30* 9.40*  CALCIUM 9.7 9.0    MICRO Recent Results (from the past 240 hour(s))  MRSA PCR SCREENING     Status: None   Collection Time    06/21/14  5:50 PM      Result Value Ref Range Status   MRSA by PCR NEGATIVE  NEGATIVE Final   Comment:            The GeneXpert MRSA Assay (FDA     approved for NASAL specimens     only), is one component of a     comprehensive MRSA colonization     surveillance program. It is not     intended to diagnose MRSA     infection nor to guide or     monitor treatment for     MRSA infections.      Lab Results  Component Value Date   PTH 559.0* 06/24/2012   CALCIUM 9.0 06/22/2014   PHOS 3.6 10/12/2012  Impression: 1)Renal ESRD on PD  Pt willing to undergo HD sec to non availability on Peritoneal dialysis.  ON  HD during this admission This is 2nd day, pt now better .  2)HTN BP n at goal  Sec to compliance issues  Medication  On Calcium Channel Blockers  On Alpha and beta Blockers  On Vasodilators-  On Central Acting Sympatholytics-   3)Anemia HGb at goal (9--11)   on Epo   4)CKD Mineral-Bone Disorder  PTH acceptable .  Secondary Hyperparathyroidism present.  Phosphorus at goal.   5)CHF- admitted with acute exacerbation  Fluid overload  Primary MD following  Now much better  6)Electrolytes  Normokalemic  NOrmonatremic   7)Acid base  Co2 at goal    Plan:  Will continue current care      [name] 06/22/2014, 9:04 AM

## 2014-06-22 NOTE — Progress Notes (Signed)
CRITICAL VALUE ALERT  Critical value received:  Troponin 0.73  Date of notification:  06/22/14  Time of notification:  0510  Critical value read back: yes   Nurse who received alert:  Candiss Norse RN  MD notified (1st page):  Dr. Georgiann Mohs  Time of first page:  [phone]  MD notified (2nd page):  Time of second page:  Responding MD:  Dr. Georgiann Mohs  Time MD responded:  [phone]

## 2014-06-22 NOTE — Progress Notes (Signed)
Follow-up:  Notified by RN regarding 2nd troponin elevated at 0.63. Pt denies CP and has been CP free since admission. Repeat EKG reveals SR w/ occasional PVC's c/w EKG on admission. Admission Troponin neg, BNP 70,000.00, CXR c/w CHF w/ pulmonary edema and small (L) pleural effusion. Pt remains on BiPAP. NTG qtt currently off d/t SBP in 80's. Remaining VSS. Curbside consultation with Dr Percival Spanish w/ cardiology service who agrees w/ Heparin qtt and full strength ASA qd. Pt is on Lipitor. Will defer BB for now. He has also recommended to continue cycling CE's and have primary team re-consult cardiology in am.  Assessment/Plan: 1. Elevated Troponin: Unsure of significance in setting of acute hypoxic respiratory failure, ESRD and acute on chronic CHF. Continues to deny CP. EKG w/o ischemic changes. Will initiate Heparin qtt and qd ASA (325 mg). Will continue to cycle CE's. Rounding team to re-consult cardiology service in am as indicated. Will continue to monitor closely in  SDU.  Jeryl Columbia, NP-C [facility] Pager [phone]

## 2014-06-22 NOTE — Progress Notes (Signed)
ANTICOAGULATION CONSULT NOTE  Pharmacy Consult for Heparin Indication: ACS/STEMI  No Known Allergies  Patient Measurements: Height: 5\' 9"  (175.3 cm) Weight: 162 lb 0.6 oz (73.5 kg) IBW/kg (Calculated) : 70.7 Heparin Dosing Weight: 75.2 KG  Vital Signs: Temp: 98.7 F (37.1 C) (09/30 0400) Temp src: Oral (09/30 0400) BP: 144/94 mmHg (09/30 0645) Pulse Rate: 73 (09/30 0645)  Labs:  Recent Labs  06/21/14 1046 06/21/14 1855 06/21/14 2127 06/21/14 2137 06/22/14 0337  HGB 9.9*  --   --   --  8.6*  HCT 31.2*  --   --   --  27.3*  PLT 359  --   --   --  284  LABPROT  --   --   --  13.3  --   INR  --   --   --  1.01  --   HEPARINUNFRC  --   --   --   --  0.10*  CREATININE 12.30*  --   --   --  9.40*  TROPONINI <0.30 0.63* 0.73*  --  0.73*   Estimated Creatinine Clearance: 6.3 ml/min (by C-G formula based on Cr of 9.4).  Medical History: Past Medical History  Diagnosis Date  . Hypertensive heart disease   . Hypercholesterolemia   . Gout   . Secondary cardiomyopathy     LVEF 40-45%  . Anemia of chronic disease   . Hypertension   . CHF (congestive heart failure)   . Arthritis   . CKD (chronic kidney disease) stage 4, GFR 15-29 ml/min     MWF- Hemodialysis  . Peripheral vascular disease   . Shortness of breath   . Pneumonia   . Constipation   . History of blood transfusion    Medications:  Scheduled:  . amLODipine  10 mg Oral Daily  . antiseptic oral rinse  7 mL Mouth Rinse BID  . aspirin  325 mg Oral Daily  . atorvastatin  40 mg Oral q1800  . calcitRIOL  0.5 mcg Oral Daily  . cloNIDine  0.2 mg Oral BID  . epoetin alfa  4,000 Units Intravenous Once per day on Mon Wed Fri  . furosemide  40 mg Intravenous Q12H  . heparin  2,000 Units Intravenous Once  . hydrALAZINE  50 mg Oral TID  . insulin aspart  0-9 Units Subcutaneous TID WC  . labetalol  400 mg Oral BID  . rOPINIRole  0.25 mg Oral QHS  . sodium chloride  3 mL Intravenous Q12H   Assessment: Okay for  Protocol, no bleeding noted.  Baseline coag labs pending.  Received Heparin during HD 9/29.  Elevated troponin noted and Heparin to be started for ACS/STEMI R/O. Initial Heparin level below goal.  H/H trending down some.  Goal of Therapy:  Heparin level 0.3-0.7 units/ml Monitor platelets by anticoagulation protocol: Yes   Plan:  Repeat Heparin bolus 2000 units now Increase Heparin infusion to 1250 units/hr Recheck Heparin level in 6-8 hours Daily Heparin level while on Heparin CBC daily while on Heparin  Letticia Bhattacharyya A 06/22/2014,7:45 AM

## 2014-06-23 ENCOUNTER — Encounter (HOSPITAL_COMMUNITY): Payer: Self-pay | Admitting: Cardiology

## 2014-06-23 DIAGNOSIS — I509 Heart failure, unspecified: Secondary | ICD-10-CM

## 2014-06-23 DIAGNOSIS — I429 Cardiomyopathy, unspecified: Secondary | ICD-10-CM

## 2014-06-23 DIAGNOSIS — I110 Hypertensive heart disease with heart failure: Secondary | ICD-10-CM

## 2014-06-23 DIAGNOSIS — J9601 Acute respiratory failure with hypoxia: Secondary | ICD-10-CM

## 2014-06-23 DIAGNOSIS — N186 End stage renal disease: Secondary | ICD-10-CM

## 2014-06-23 DIAGNOSIS — I10 Essential (primary) hypertension: Secondary | ICD-10-CM

## 2014-06-23 LAB — GLUCOSE, CAPILLARY
GLUCOSE-CAPILLARY: 108 mg/dL — AB (ref 70–99)
Glucose-Capillary: 90 mg/dL (ref 70–99)

## 2014-06-23 MED ORDER — AMLODIPINE BESYLATE 10 MG PO TABS: 10.0000 mg | ORAL_TABLET | Freq: Every day | ORAL | Status: DC

## 2014-06-23 MED ORDER — CALCITRIOL 0.5 MCG PO CAPS: 0.5000 ug | ORAL_CAPSULE | Freq: Every day | ORAL | Status: DC

## 2014-06-23 MED ORDER — ASPIRIN 81 MG PO TBEC: 81.0000 mg | DELAYED_RELEASE_TABLET | Freq: Every day | ORAL | Status: DC

## 2014-06-23 NOTE — Progress Notes (Signed)
Primary cardiologist: Dr. Satira Sark  Subjective:   Feels much better today. No chest pain or shortness of breath.   Objective:   Temp:  [97.9 F (36.6 C)-98.4 F (36.9 C)] 97.9 F (36.6 C) (10/01 0808) Pulse Rate:  [65-100] 67 (10/01 0800) Resp:  [11-23] 13 (10/01 0800) BP: (99-170)/(42-103) 170/87 mmHg (10/01 0820) SpO2:  [96 %-100 %] 96 % (10/01 0800) Weight:  [163 lb 2.3 oz (74 kg)] 163 lb 2.3 oz (74 kg) (10/01 0500) Last BM Date: 06/20/14  Filed Weights   06/22/14 0400 06/22/14 0815 06/23/14 0500  Weight: 162 lb 0.6 oz (73.5 kg) 159 lb 13.3 oz (72.5 kg) 163 lb 2.3 oz (74 kg)    Intake/Output Summary (Last 24 hours) at 06/23/14 0831 Last data filed at 06/23/14 0809  Gross per 24 hour  Intake    493 ml  Output   2102 ml  Net  -1609 ml    Telemetry: Sinus rhythm with rare PVCs.  Exam:  General: Appears comfortable.  Lungs: Clear with decreased breath sounds at the bases.  Cardiac: RRR with 2-0/9 systolic murmur at the base, no gallop.  Abdomen: NABS.  Extremities: No pitting.   Lab Results:  Basic Metabolic Panel:  Recent Labs Lab 06/21/14 1046 06/22/14 0337  NA 141 141  K 4.4 3.9  CL 98 100  CO2 27 27  GLUCOSE 146* 98  BUN 66* 45*  CREATININE 12.30* 9.40*  CALCIUM 9.7 9.0    Liver Function Tests:  Recent Labs Lab 06/22/14 0337  AST 25  ALT 16  ALKPHOS 52  BILITOT 0.4  PROT 6.3  ALBUMIN 2.7*    CBC:  Recent Labs Lab 06/21/14 1046 06/22/14 0337  WBC 7.2 5.5  HGB 9.9* 8.6*  HCT 31.2* 27.3*  MCV 99.7 98.9  PLT 359 284    Cardiac Enzymes:  Recent Labs Lab 06/22/14 0337 06/22/14 0819 06/22/14 1408  TROPONINI 0.73* 0.64* 0.53*    BNP:  Recent Labs  06/21/14 1046  PROBNP >70000.0*    Coagulation:  Recent Labs Lab 06/21/14 2137  INR 1.01    Echocardiogram (06/22/14): Study Conclusions  - Left ventricle: The cavity size was normal. Wall thickness was increased in a pattern of moderate LVH.  Systolic function was mildly to moderately reduced. The estimated ejection fraction was in the range of 40% to 45%. Diffuse hypokinesis. Doppler parameters are consistent with abnormal left ventricular relaxation (grade 1 diastolic dysfunction). - Aortic valve: Moderately calcified annulus. Trileaflet; mildly thickened leaflets. There was mild to moderate stenosis. Stenosis is moderate by caluculated AVA and mild by pressure gradients. There was mild regurgitation. Mean gradient (S): 14 mm Hg. Valve area (VTI): 1.19 cm^2. Valve area (Vmax): 1.32 cm^2. Regurgitation pressure half-time: 587 ms. - Mitral valve: Mildly calcified annulus. Mildly thickened leaflets . - Left atrium: The atrium was moderately dilated. - Atrial septum: No defect or patent foramen ovale was identified. - Pulmonary arteries: Systolic pressure was moderately increased. PA peak pressure: 45 mm Hg (S). - Inferior vena cava: The vessel was normal in size. The respirophasic diameter changes were in the normal range (= 50%), consistent with normal central venous pressure. - Technically adequate study.    Medications:   Scheduled Medications: . amLODipine  10 mg Oral Daily  . antiseptic oral rinse  7 mL Mouth Rinse BID  . aspirin EC  81 mg Oral Daily  . atorvastatin  80 mg Oral q1800  . calcitRIOL  0.5 mcg Oral  Daily  . cloNIDine  0.2 mg Oral BID  . epoetin alfa  4,000 Units Intravenous Once per day on Mon Wed Fri  . furosemide  40 mg Intravenous Q12H  . heparin subcutaneous  5,000 Units Subcutaneous 3 times per day  . hydrALAZINE  50 mg Oral TID  . insulin aspart  0-9 Units Subcutaneous TID WC  . labetalol  400 mg Oral BID  . rOPINIRole  0.25 mg Oral QHS  . sodium chloride  3 mL Intravenous Q12H      PRN Medications:  sodium chloride, sodium chloride, sodium chloride, acetaminophen, acetaminophen, alum & mag hydroxide-simeth, feeding supplement (NEPRO CARB STEADY), HYDROcodone-acetaminophen, lidocaine  (PF), lidocaine-prilocaine, ondansetron (ZOFRAN) IV, ondansetron, pentafluoroprop-tetrafluoroeth, senna-docusate, sodium chloride   Assessment:   1. Acute on chronic systolic heart failure complicated by volume overload, dietary noncompliance, questionable adequate dialysis. He is much improved after hemodialysis session with volume removal, blood pressure is better controlled as well.  2. Demand ischemia in the setting of above, peak troponin I 0.73. ECG with repolarization abnormalities.  3. Probable nonischemic cardiomyopathy with stable LVEF 40-45%.  4. End stage renal disease, patient has been using home peritoneal dialysis but does have a functioning AV fistula.  5. Essential hypertension with interval hypertensive urgency.  6. Mild to moderate aortic stenosis.   Plan/Discussion:    No further ischemic testing is planned at this time. Would continue current outpatient antihypertensive regimen. I reinforced compliance with dialysis and followup with Dr. Lowanda Foster. He states he would like to go home today, and this is reasonable if outpatient followup can be arranged. We will plan to see him back in the next few weeks to determine if any further medication adjustments are warranted.   Satira Sark, M.D., F.A.C.C.

## 2014-06-23 NOTE — Discharge Summary (Signed)
Physician Discharge Summary  Randy [name] QIH:[id number] DOB: 1931/10/17 DOA: 06/21/2014  PCP: [name], MD  Admit date: 06/21/2014 Discharge date: 06/23/2014   Recommendations for Outpatient Follow-up:  Followup in office within one week to assess volume status and electrolytes Discharge Diagnoses:  Principal Problem:   Acute respiratory failure Active Problems:   HYPERLIPIDEMIA   Hypertensive heart disease   End stage renal disease   Acute pulmonary edema   Accelerated hypertension   Anemia of chronic disease   Discharge Condition: Good  Filed Weights   06/22/14 0400 06/22/14 0815 06/23/14 0500  Weight: 162 lb 0.6 oz (73.5 kg) 159 lb 13.3 oz (72.5 kg) 163 lb 2.3 oz (74 kg)    History of present illness:  Patient was admitted with accelerated hypertension with chronic peritoneal dialysis by his choice congestive heart failure and nonischemic mild to moderate systolic dysfunction ejection range of 44% the patient was given her hemodialysis acutely and reduces it status symptomatic symptoms symptoms improved and had mildly elevated troponins this is due to presumably demand ischemia based on cardiology his electrolytes normalized on day of discharge his systolic blood pressure is 117/83 hemodynamically stable with no evidence of congestive heart clinically  Hospital Course:  Patient was given hemodialysis with dramatic volume reduction and urged to have hemodialysis however the patient refused he understands the risks of continuing peritoneal dialysis which in my opinion is insufficient  Procedures:  Hemodialysis  Consultations:  Cardiology and nephrology  Discharge Instructions  Discharge Instructions   Discharge instructions    Complete by:  As directed      Discharge patient    Complete by:  As directed             Medication List         amLODipine 10 MG tablet  Commonly known as:  NORVASC  Take 1 tablet (10 mg total) by mouth daily.     aspirin 81 MG EC tablet  Take 1 tablet (81 mg total) by mouth daily.     calcitRIOL 0.5 MCG capsule  Commonly known as:  ROCALTROL  Take 1 mcg by mouth daily.     calcitRIOL 0.5 MCG capsule  Commonly known as:  ROCALTROL  Take 1 capsule (0.5 mcg total) by mouth daily.     cloNIDine 0.2 MG tablet  Commonly known as:  CATAPRES  Take 1 tablet (0.2 mg total) by mouth 2 (two) times daily.     hydrALAZINE 50 MG tablet  Commonly known as:  APRESOLINE  Take 1 tablet (50 mg total) by mouth 3 (three) times daily.     labetalol 200 MG tablet  Commonly known as:  NORMODYNE  Take 2 tablets (400 mg total) by mouth 2 (two) times daily.     multivitamin Tabs tablet  Take 1 tablet by mouth daily.     rOPINIRole 0.25 MG tablet  Commonly known as:  REQUIP  Take 0.25-0.5 mg by mouth at bedtime.     sevelamer carbonate 800 MG tablet  Commonly known as:  RENVELA  Take 2,400-4,000 mg by mouth 3 (three) times daily with meals. To take 5 with meals and 3 with snacks     simvastatin 80 MG tablet  Commonly known as:  ZOCOR  Take 80 mg by mouth every Monday, Wednesday, and Friday.       No Known Allergies    The results of significant diagnostics from this hospitalization (including imaging, microbiology, ancillary and laboratory) are listed below  for reference.    Significant Diagnostic Studies: Dg Chest Portable 1 View  06/21/2014   CLINICAL DATA:  Severe shortness of breath since yesterday. , also cough ; history of CHF and hypertension and chronic renal insufficiency  EXAM: PORTABLE CHEST - 1 VIEW  COMPARISON:  PA and lateral chest x-ray of March 28, 2014  FINDINGS: The lungs are mildly hyperinflated. There is increased pleural fluid on the left. The pulmonary vascularity is engorged and the interstitial markings are increased bilaterally. The cardiopericardial silhouette is enlarged. There is mild tortuosity of the descending thoracic aorta. The bony thorax exhibits no acute abnormality.   IMPRESSION: Congestive heart failure with interstitial edema and small left pleural effusion. There may be underlying COPD.   Electronically Signed   By: David  Martinique   On: 06/21/2014 10:58    Microbiology: Recent Results (from the past 240 hour(s))  MRSA PCR SCREENING     Status: None   Collection Time    06/21/14  5:50 PM      Result Value Ref Range Status   MRSA by PCR NEGATIVE  NEGATIVE Final   Comment:            The GeneXpert MRSA Assay (FDA     approved for NASAL specimens     only), is one component of a     comprehensive MRSA colonization     surveillance program. It is not     intended to diagnose MRSA     infection nor to guide or     monitor treatment for     MRSA infections.     Labs: Basic Metabolic Panel:  Recent Labs Lab 06/21/14 1046 06/22/14 0337  NA 141 141  K 4.4 3.9  CL 98 100  CO2 27 27  GLUCOSE 146* 98  BUN 66* 45*  CREATININE 12.30* 9.40*  CALCIUM 9.7 9.0   Liver Function Tests:  Recent Labs Lab 06/22/14 0337  AST 25  ALT 16  ALKPHOS 52  BILITOT 0.4  PROT 6.3  ALBUMIN 2.7*   No results found for this basename: LIPASE, AMYLASE,  in the last 168 hours No results found for this basename: AMMONIA,  in the last 168 hours CBC:  Recent Labs Lab 06/21/14 1046 06/22/14 0337  WBC 7.2 5.5  NEUTROABS 5.0  --   HGB 9.9* 8.6*  HCT 31.2* 27.3*  MCV 99.7 98.9  PLT 359 284   Cardiac Enzymes:  Recent Labs Lab 06/21/14 1855 06/21/14 2127 06/22/14 0337 06/22/14 0819 06/22/14 1408  TROPONINI 0.63* 0.73* 0.73* 0.64* 0.53*   BNP: BNP (last 3 results)  Recent Labs  06/21/14 1046  PROBNP >70000.0*   CBG:  Recent Labs Lab 06/22/14 1109 06/22/14 1646 06/22/14 2053 06/23/14 0732 06/23/14 1135  GLUCAP 104* 89 131* 108* 90       Signed:  Joscelin Fray M  [facility] Pager: [phone] 06/23/2014, 2:15 PM

## 2014-06-23 NOTE — Progress Notes (Signed)
Subjective: Interval History: has no complaint of in breathing or orthopnea. Patient overall feels better. He does have any chest pain and appetite is good.  Objective: Vital signs in last 24 hours: Temp:  [97.9 F (36.6 C)-98.4 F (36.9 C)] 97.9 F (36.6 C) (10/01 0808) Pulse Rate:  [61-124] 61 (10/01 0930) Resp:  [10-23] 10 (10/01 0930) BP: (99-170)/(42-103) 121/52 mmHg (10/01 0930) SpO2:  [96 %-100 %] 100 % (10/01 0930) Weight:  [74 kg (163 lb 2.3 oz)] 74 kg (163 lb 2.3 oz) (10/01 0500) Weight change: -1.436 kg (-3 lb 2.7 oz)  Intake/Output from previous day: 09/30 0701 - 10/01 0700 In: 493 [P.O.:240; I.V.:253] Out: 2027 [Urine:25] Intake/Output this shift: Total I/O In: 270 [P.O.:240; I.V.:30] Out: 75 [Urine:75]  General appearance: alert, cooperative and no distress Resp: clear to auscultation bilaterally Cardio: regular rate and rhythm, S1, S2 normal, no murmur, click, rub or gallop GI: soft, non-tender; bowel sounds normal; no masses,  no organomegaly Extremities: extremities normal, atraumatic, no cyanosis or edema  Lab Results:  Recent Labs  06/21/14 1046 06/22/14 0337  WBC 7.2 5.5  HGB 9.9* 8.6*  HCT 31.2* 27.3*  PLT 359 284   BMET:  Recent Labs  06/21/14 1046 06/22/14 0337  NA 141 141  K 4.4 3.9  CL 98 100  CO2 27 27  GLUCOSE 146* 98  BUN 66* 45*  CREATININE 12.30* 9.40*  CALCIUM 9.7 9.0   No results found for this basename: PTH,  in the last 72 hours Iron Studies: No results found for this basename: IRON, TIBC, TRANSFERRIN, FERRITIN,  in the last 72 hours  Studies/Results: Dg Chest Portable 1 View  06/21/2014   CLINICAL DATA:  Severe shortness of breath since yesterday. , also cough ; history of CHF and hypertension and chronic renal insufficiency  EXAM: PORTABLE CHEST - 1 VIEW  COMPARISON:  PA and lateral chest x-ray of March 28, 2014  FINDINGS: The lungs are mildly hyperinflated. There is increased pleural fluid on the left. The pulmonary  vascularity is engorged and the interstitial markings are increased bilaterally. The cardiopericardial silhouette is enlarged. There is mild tortuosity of the descending thoracic aorta. The bony thorax exhibits no acute abnormality.  IMPRESSION: Congestive heart failure with interstitial edema and small left pleural effusion. There may be underlying COPD.   Electronically Signed   By: David  Martin[place]   On: 06/21/2014 10:58    I have reviewed the patient's current medications.  Assessment/Plan: Problem #1 difficulty in breathing: Thought to be from CHF. Patient couldn't tolerate ultrafiltration yesterday however we're able to remove about 2 L. Presently patient is a symptomatic. Problem #2 end-stage renal disease: Presently he doesn't have any uremic sign and symptoms and his potassium is good. Problem #3 hypertension: His blood pressure is reasonably controlled Problem #4 anemia: His abdomen is low and below range. Patient on Epogen Problem #5 metabolic bone disease : His calcium is range Problem #6 peripherovascular disease Plan: We'll continue his present management If patient is going to be discharged he'll start doing his peritoneal dialysis at home. If patient is here we'll make her for him to get do dialysis tomorrow. We'll continue with Epogen.   LOS: 2 days   Sareena Odeh S 06/23/2014,10:28 AM

## 2014-06-23 NOTE — Care Management Note (Signed)
    Page 1 of 1   06/23/2014     3:05:10 PM CARE MANAGEMENT NOTE 06/23/2014  Patient:  Randy [name], Randy [name]   Account Number:  [id number]  Date Initiated:  06/23/2014  Documentation initiated by:  Theophilus Kinds  Subjective/Objective Assessment:   Pt admitted from home with acute respiratory failure. Pt lives with his wife and will return home at discharge. Pt is independent with ADL's.     Action/Plan:   Pt discharged home today. No CM needs noted.   Anticipated DC Date:  06/23/2014   Anticipated DC Plan:  [place]  CM consult      Choice offered to / List presented to:             Status of service:  Completed, signed off Medicare Important Message given?  NA - LOS <3 / Initial given by admissions (If response is "NO", the following Medicare IM given date fields will be blank) Date Medicare IM given:   Medicare IM given by:   Date Additional Medicare IM given:   Additional Medicare IM given by:    Discharge Disposition:  HOME/SELF CARE  Per UR Regulation:    If discussed at Long Length of Stay Meetings, dates discussed:    Comments:  06/23/14 [place], RN BSN CM

## 2014-06-23 NOTE — Plan of Care (Signed)
Problem: Phase I Progression Outcomes Goal: Pain controlled with appropriate interventions Outcome: Progressing Patient has PRN pain medication ordered Goal: Initial discharge plan identified Outcome: Completed/Met Date Met:  06/23/14 Home with family Goal: Voiding-avoid urinary catheter unless indicated Outcome: Completed/Met Date Met:  06/23/14 Dialysis patient , making small amounts of urine, voids in urinal without difficulty

## 2014-06-23 NOTE — Progress Notes (Signed)
Pt discharged to home; left via wheelchair with NT; all belongings taken with patient; VS WNL; prescriptions given; discharge instructions given; all questions answered; pt verbalized understanding of medications and instructions.

## 2014-08-17 ENCOUNTER — Encounter (HOSPITAL_COMMUNITY)
Admission: EM | Disposition: A | Discharge status: Home / Self Care | Payer: Medicare Other | Source: Home / Self Care | Attending: Cardiology

## 2014-08-17 ENCOUNTER — Inpatient Hospital Stay (HOSPITAL_COMMUNITY)
Admission: EM | Admit: 2014-08-17 | Discharge: 2014-08-23 | DRG: 286 | Disposition: A | Discharge status: Home / Self Care | Payer: Medicare Other | Attending: Cardiology | Admitting: Cardiology

## 2014-08-17 ENCOUNTER — Encounter (HOSPITAL_COMMUNITY): Payer: Self-pay

## 2014-08-17 ENCOUNTER — Emergency Department (HOSPITAL_COMMUNITY): Payer: Medicare Other

## 2014-08-17 DIAGNOSIS — Z7982 Long term (current) use of aspirin: Secondary | ICD-10-CM

## 2014-08-17 DIAGNOSIS — I429 Cardiomyopathy, unspecified: Secondary | ICD-10-CM | POA: Diagnosis present

## 2014-08-17 DIAGNOSIS — N186 End stage renal disease: Secondary | ICD-10-CM | POA: Diagnosis present

## 2014-08-17 DIAGNOSIS — I493 Ventricular premature depolarization: Secondary | ICD-10-CM | POA: Diagnosis present

## 2014-08-17 DIAGNOSIS — I214 Non-ST elevation (NSTEMI) myocardial infarction: Secondary | ICD-10-CM | POA: Diagnosis present

## 2014-08-17 DIAGNOSIS — Z9114 Patient's other noncompliance with medication regimen: Secondary | ICD-10-CM | POA: Diagnosis not present

## 2014-08-17 DIAGNOSIS — I319 Disease of pericardium, unspecified: Secondary | ICD-10-CM | POA: Diagnosis present

## 2014-08-17 DIAGNOSIS — Z87891 Personal history of nicotine dependence: Secondary | ICD-10-CM | POA: Diagnosis not present

## 2014-08-17 DIAGNOSIS — R079 Chest pain, unspecified: Secondary | ICD-10-CM

## 2014-08-17 DIAGNOSIS — I309 Acute pericarditis, unspecified: Secondary | ICD-10-CM | POA: Diagnosis present

## 2014-08-17 DIAGNOSIS — I739 Peripheral vascular disease, unspecified: Secondary | ICD-10-CM | POA: Diagnosis not present

## 2014-08-17 DIAGNOSIS — K30 Functional dyspepsia: Secondary | ICD-10-CM | POA: Diagnosis not present

## 2014-08-17 DIAGNOSIS — I132 Hypertensive heart and chronic kidney disease with heart failure and with stage 5 chronic kidney disease, or end stage renal disease: Secondary | ICD-10-CM | POA: Diagnosis present

## 2014-08-17 DIAGNOSIS — I5023 Acute on chronic systolic (congestive) heart failure: Secondary | ICD-10-CM | POA: Diagnosis present

## 2014-08-17 DIAGNOSIS — K5900 Constipation, unspecified: Secondary | ICD-10-CM | POA: Diagnosis not present

## 2014-08-17 DIAGNOSIS — D638 Anemia in other chronic diseases classified elsewhere: Secondary | ICD-10-CM | POA: Diagnosis not present

## 2014-08-17 DIAGNOSIS — Z992 Dependence on renal dialysis: Secondary | ICD-10-CM | POA: Diagnosis not present

## 2014-08-17 DIAGNOSIS — M1990 Unspecified osteoarthritis, unspecified site: Secondary | ICD-10-CM | POA: Diagnosis present

## 2014-08-17 DIAGNOSIS — M109 Gout, unspecified: Secondary | ICD-10-CM | POA: Diagnosis present

## 2014-08-17 DIAGNOSIS — E780 Pure hypercholesterolemia: Secondary | ICD-10-CM | POA: Diagnosis present

## 2014-08-17 DIAGNOSIS — I350 Nonrheumatic aortic (valve) stenosis: Secondary | ICD-10-CM | POA: Diagnosis not present

## 2014-08-17 DIAGNOSIS — I359 Nonrheumatic aortic valve disorder, unspecified: Secondary | ICD-10-CM

## 2014-08-17 DIAGNOSIS — R109 Unspecified abdominal pain: Secondary | ICD-10-CM | POA: Diagnosis not present

## 2014-08-17 DIAGNOSIS — I272 Other secondary pulmonary hypertension: Secondary | ICD-10-CM | POA: Diagnosis not present

## 2014-08-17 DIAGNOSIS — R062 Wheezing: Secondary | ICD-10-CM | POA: Diagnosis not present

## 2014-08-17 DIAGNOSIS — Z79899 Other long term (current) drug therapy: Secondary | ICD-10-CM | POA: Diagnosis not present

## 2014-08-17 DIAGNOSIS — I2510 Atherosclerotic heart disease of native coronary artery without angina pectoris: Secondary | ICD-10-CM

## 2014-08-17 HISTORY — PX: LEFT HEART CATHETERIZATION WITH CORONARY ANGIOGRAM: SHX5451

## 2014-08-17 HISTORY — DX: Non-ST elevation (NSTEMI) myocardial infarction: I21.4

## 2014-08-17 LAB — CBC WITH DIFFERENTIAL/PLATELET
Basophils Absolute: 0 10*3/uL (ref 0.0–0.1)
Basophils Relative: 0 % (ref 0–1)
Eosinophils Absolute: 0.1 10*3/uL (ref 0.0–0.7)
Eosinophils Relative: 1 % (ref 0–5)
HEMATOCRIT: 28 % — AB (ref 39.0–52.0)
HEMOGLOBIN: 8.9 g/dL — AB (ref 13.0–17.0)
LYMPHS PCT: 8 % — AB (ref 12–46)
Lymphs Abs: 0.8 10*3/uL (ref 0.7–4.0)
MCH: 31.2 pg (ref 26.0–34.0)
MCHC: 31.8 g/dL (ref 30.0–36.0)
MCV: 98.2 fL (ref 78.0–100.0)
MONO ABS: 1 10*3/uL (ref 0.1–1.0)
MONOS PCT: 10 % (ref 3–12)
Neutro Abs: 8.1 10*3/uL — ABNORMAL HIGH (ref 1.7–7.7)
Neutrophils Relative %: 81 % — ABNORMAL HIGH (ref 43–77)
Platelets: 308 10*3/uL (ref 150–400)
RBC: 2.85 MIL/uL — AB (ref 4.22–5.81)
RDW: 16.7 % — ABNORMAL HIGH (ref 11.5–15.5)
WBC: 9.9 10*3/uL (ref 4.0–10.5)

## 2014-08-17 LAB — CREATININE, SERUM
Creatinine, Ser: 14.07 mg/dL — ABNORMAL HIGH (ref 0.50–1.35)
GFR calc non Af Amer: 3 mL/min — ABNORMAL LOW (ref >90)
GFR, EST AFRICAN AMERICAN: 3 mL/min — AB (ref >90)

## 2014-08-17 LAB — TROPONIN I
TROPONIN I: 3.52 ng/mL — AB (ref <0.30)
TROPONIN I: 1.75 ng/mL — AB (ref <0.30)
Troponin I: 2.56 ng/mL (ref <0.30)

## 2014-08-17 LAB — CBC
HCT: 22.1 % — ABNORMAL LOW (ref 39.0–52.0)
Hemoglobin: 7 g/dL — ABNORMAL LOW (ref 13.0–17.0)
MCH: 31 pg (ref 26.0–34.0)
MCHC: 31.7 g/dL (ref 30.0–36.0)
MCV: 97.8 fL (ref 78.0–100.0)
PLATELETS: 277 10*3/uL (ref 150–400)
RBC: 2.26 MIL/uL — ABNORMAL LOW (ref 4.22–5.81)
RDW: 16.5 % — AB (ref 11.5–15.5)
WBC: 10.4 10*3/uL (ref 4.0–10.5)

## 2014-08-17 LAB — BASIC METABOLIC PANEL
ANION GAP: 19 — AB (ref 5–15)
BUN: 70 mg/dL — ABNORMAL HIGH (ref 6–23)
CO2: 27 meq/L (ref 19–32)
Calcium: 9.9 mg/dL (ref 8.4–10.5)
Chloride: 93 mEq/L — ABNORMAL LOW (ref 96–112)
Creatinine, Ser: 14.45 mg/dL — ABNORMAL HIGH (ref 0.50–1.35)
GFR calc Af Amer: 3 mL/min — ABNORMAL LOW (ref >90)
GFR calc non Af Amer: 3 mL/min — ABNORMAL LOW (ref >90)
Glucose, Bld: 141 mg/dL — ABNORMAL HIGH (ref 70–99)
POTASSIUM: 3.3 meq/L — AB (ref 3.7–5.3)
SODIUM: 139 meq/L (ref 137–147)

## 2014-08-17 LAB — POCT ACTIVATED CLOTTING TIME: ACTIVATED CLOTTING TIME: 146 s

## 2014-08-17 LAB — TSH: TSH: 0.861 u[IU]/mL (ref 0.350–4.500)

## 2014-08-17 LAB — PROTIME-INR
INR: 1.16 (ref 0.00–1.49)
Prothrombin Time: 15 seconds (ref 11.6–15.2)

## 2014-08-17 LAB — PRO B NATRIURETIC PEPTIDE: Pro B Natriuretic peptide (BNP): >70000 pg/mL — ABNORMAL HIGH (ref 0–450)

## 2014-08-17 SURGERY — LEFT HEART CATHETERIZATION WITH CORONARY ANGIOGRAM
Anesthesia: LOCAL

## 2014-08-17 MED ORDER — ASPIRIN 325 MG PO TABS
650.0000 mg | ORAL_TABLET | Freq: Three times a day (TID) | ORAL | Status: DC
  Administered 2014-08-17 – 2014-08-23 (×18): 650 mg via ORAL
  Filled 2014-08-17 (×23): qty 2

## 2014-08-17 MED ORDER — SODIUM CHLORIDE 0.9 % IJ SOLN: 3.0000 mL | Freq: Two times a day (BID) | INTRAMUSCULAR | Status: DC

## 2014-08-17 MED ORDER — MORPHINE SULFATE 4 MG/ML IJ SOLN
4.0000 mg | Freq: Once | INTRAMUSCULAR | Status: AC
Start: 2014-08-17 — End: 2014-08-17
  Administered 2014-08-17: 4 mg via INTRAVENOUS
  Filled 2014-08-17: qty 1

## 2014-08-17 MED ORDER — NITROGLYCERIN IN D5W 200-5 MCG/ML-% IV SOLN
5.0000 ug/min | Freq: Once | INTRAVENOUS | Status: AC
  Administered 2014-08-17: 5 ug/min via INTRAVENOUS
  Filled 2014-08-17: qty 250

## 2014-08-17 MED ORDER — SODIUM CHLORIDE 0.9 % IJ SOLN: 3.0000 mL | INTRAMUSCULAR | Status: DC | PRN

## 2014-08-17 MED ORDER — HEPARIN SODIUM (PORCINE) 5000 UNIT/ML IJ SOLN
5000.0000 [IU] | Freq: Three times a day (TID) | INTRAMUSCULAR | Status: DC
  Administered 2014-08-17 – 2014-08-23 (×16): 5000 [IU] via SUBCUTANEOUS
  Filled 2014-08-17 (×21): qty 1

## 2014-08-17 MED ORDER — SODIUM CHLORIDE 0.9 % IV SOLN: 250.0000 mL | INTRAVENOUS | Status: DC | PRN

## 2014-08-17 MED ORDER — LIDOCAINE HCL (PF) 1 % IJ SOLN
INTRAMUSCULAR | Status: AC
  Filled 2014-08-17: qty 30

## 2014-08-17 MED ORDER — NITROGLYCERIN 0.4 MG SL SUBL: 0.4000 mg | SUBLINGUAL_TABLET | SUBLINGUAL | Status: DC | PRN

## 2014-08-17 MED ORDER — RENA-VITE PO TABS
1.0000 | ORAL_TABLET | Freq: Every day | ORAL | Status: DC
  Administered 2014-08-17 – 2014-08-22 (×6): 1 via ORAL
  Filled 2014-08-17 (×8): qty 1

## 2014-08-17 MED ORDER — HEPARIN (PORCINE) IN NACL 2-0.9 UNIT/ML-% IJ SOLN
INTRAMUSCULAR | Status: AC
  Filled 2014-08-17: qty 1500

## 2014-08-17 MED ORDER — ASPIRIN 81 MG PO CHEW: 81.0000 mg | CHEWABLE_TABLET | ORAL | Status: DC

## 2014-08-17 MED ORDER — SEVELAMER CARBONATE 800 MG PO TABS
2400.0000 mg | ORAL_TABLET | ORAL | Status: DC | PRN
  Administered 2014-08-17: 2400 mg via ORAL
  Filled 2014-08-17 (×2): qty 3

## 2014-08-17 MED ORDER — FUROSEMIDE 10 MG/ML IJ SOLN
120.0000 mg | Freq: Once | INTRAVENOUS | Status: AC
  Administered 2014-08-17: 120 mg via INTRAVENOUS
  Filled 2014-08-17: qty 12

## 2014-08-17 MED ORDER — FENTANYL CITRATE 0.05 MG/ML IJ SOLN
INTRAMUSCULAR | Status: AC
  Filled 2014-08-17: qty 2

## 2014-08-17 MED ORDER — HYDRALAZINE HCL 50 MG PO TABS
50.0000 mg | ORAL_TABLET | Freq: Three times a day (TID) | ORAL | Status: DC
  Administered 2014-08-17: 50 mg via ORAL
  Filled 2014-08-17 (×5): qty 1

## 2014-08-17 MED ORDER — ASPIRIN 325 MG PO TABS
325.0000 mg | ORAL_TABLET | ORAL | Status: AC
  Administered 2014-08-17: 325 mg via ORAL
  Filled 2014-08-17: qty 1

## 2014-08-17 MED ORDER — MORPHINE SULFATE 4 MG/ML IJ SOLN
INTRAMUSCULAR | Status: AC
  Filled 2014-08-17: qty 1

## 2014-08-17 MED ORDER — HEPARIN (PORCINE) IN NACL 100-0.45 UNIT/ML-% IJ SOLN
850.0000 [IU]/h | INTRAMUSCULAR | Status: DC
  Administered 2014-08-17 (×2): 850 [IU]/h via INTRAVENOUS
  Filled 2014-08-17: qty 250

## 2014-08-17 MED ORDER — AMLODIPINE BESYLATE 10 MG PO TABS
10.0000 mg | ORAL_TABLET | Freq: Every day | ORAL | Status: DC
  Administered 2014-08-17: 10 mg via ORAL
  Filled 2014-08-17 (×2): qty 1

## 2014-08-17 MED ORDER — ONDANSETRON HCL 4 MG/2ML IJ SOLN
4.0000 mg | Freq: Once | INTRAMUSCULAR | Status: AC
  Administered 2014-08-17: 4 mg via INTRAMUSCULAR
  Filled 2014-08-17: qty 2

## 2014-08-17 MED ORDER — MORPHINE SULFATE 4 MG/ML IJ SOLN
4.0000 mg | Freq: Once | INTRAMUSCULAR | Status: AC
Start: 2014-08-17 — End: 2014-08-17
  Administered 2014-08-17: 4 mg via INTRAVENOUS

## 2014-08-17 MED ORDER — SEVELAMER CARBONATE 800 MG PO TABS
2400.0000 mg | ORAL_TABLET | Freq: Three times a day (TID) | ORAL | Status: DC
Start: 2014-08-17 — End: 2014-08-17

## 2014-08-17 MED ORDER — ACETAMINOPHEN 325 MG PO TABS: 650.0000 mg | ORAL_TABLET | ORAL | Status: DC | PRN

## 2014-08-17 MED ORDER — COLCHICINE 0.6 MG PO TABS
0.6000 mg | ORAL_TABLET | Freq: Once | ORAL | Status: AC
  Administered 2014-08-17: 15:00:00 0.6 mg via ORAL
  Filled 2014-08-17: qty 1

## 2014-08-17 MED ORDER — MIDAZOLAM HCL 2 MG/2ML IJ SOLN
INTRAMUSCULAR | Status: AC
  Filled 2014-08-17: qty 2

## 2014-08-17 MED ORDER — HEPARIN (PORCINE) IN NACL 100-0.45 UNIT/ML-% IJ SOLN
INTRAMUSCULAR | Status: AC
  Administered 2014-08-17: 25000 [IU]
  Filled 2014-08-17: qty 250

## 2014-08-17 MED ORDER — CLONIDINE HCL 0.2 MG PO TABS
0.2000 mg | ORAL_TABLET | Freq: Two times a day (BID) | ORAL | Status: DC
  Administered 2014-08-17: 0.2 mg via ORAL
  Filled 2014-08-17 (×5): qty 1

## 2014-08-17 MED ORDER — ONDANSETRON HCL 4 MG/2ML IJ SOLN
4.0000 mg | Freq: Four times a day (QID) | INTRAMUSCULAR | Status: DC | PRN
  Administered 2014-08-22 – 2014-08-23 (×2): 4 mg via INTRAVENOUS
  Filled 2014-08-17 (×2): qty 2

## 2014-08-17 MED ORDER — ATORVASTATIN CALCIUM 80 MG PO TABS
80.0000 mg | ORAL_TABLET | Freq: Every day | ORAL | Status: DC
  Administered 2014-08-17 – 2014-08-22 (×6): 80 mg via ORAL
  Filled 2014-08-17 (×8): qty 1

## 2014-08-17 MED ORDER — METOPROLOL TARTRATE 1 MG/ML IV SOLN
2.5000 mg | Freq: Once | INTRAVENOUS | Status: AC
  Administered 2014-08-17: 2.5 mg via INTRAVENOUS
  Filled 2014-08-17: qty 5

## 2014-08-17 MED ORDER — ONDANSETRON HCL 4 MG/2ML IJ SOLN: 4.0000 mg | Freq: Four times a day (QID) | INTRAMUSCULAR | Status: DC | PRN

## 2014-08-17 MED ORDER — LABETALOL HCL 200 MG PO TABS
400.0000 mg | ORAL_TABLET | Freq: Two times a day (BID) | ORAL | Status: DC
  Administered 2014-08-17: 400 mg via ORAL
  Filled 2014-08-17 (×5): qty 2

## 2014-08-17 MED ORDER — ACETAMINOPHEN 325 MG PO TABS
650.0000 mg | ORAL_TABLET | ORAL | Status: DC | PRN
  Administered 2014-08-18: 650 mg via ORAL
  Filled 2014-08-17: qty 2

## 2014-08-17 MED ORDER — NITROGLYCERIN 1 MG/10 ML FOR IR/CATH LAB
INTRA_ARTERIAL | Status: AC
  Filled 2014-08-17: qty 10

## 2014-08-17 MED ORDER — ROPINIROLE HCL 0.25 MG PO TABS
0.2500 mg | ORAL_TABLET | Freq: Every day | ORAL | Status: DC
Start: 2014-08-17 — End: 2014-08-23
  Administered 2014-08-17: 0.25 mg via ORAL
  Administered 2014-08-18 – 2014-08-20 (×3): 0.5 mg via ORAL
  Administered 2014-08-21 – 2014-08-22 (×2): 0.25 mg via ORAL
  Filled 2014-08-17 (×8): qty 2

## 2014-08-17 MED ORDER — SEVELAMER CARBONATE 800 MG PO TABS
4000.0000 mg | ORAL_TABLET | Freq: Three times a day (TID) | ORAL | Status: DC
  Administered 2014-08-17 – 2014-08-23 (×16): 4000 mg via ORAL
  Filled 2014-08-17 (×20): qty 5

## 2014-08-17 MED ORDER — SODIUM CHLORIDE 0.9 % IJ SOLN
3.0000 mL | Freq: Two times a day (BID) | INTRAMUSCULAR | Status: DC
  Administered 2014-08-17 – 2014-08-23 (×10): 3 mL via INTRAVENOUS

## 2014-08-17 MED ORDER — NITROGLYCERIN 0.4 MG SL SUBL
0.4000 mg | SUBLINGUAL_TABLET | SUBLINGUAL | Status: DC | PRN
  Administered 2014-08-17 (×3): 0.4 mg via SUBLINGUAL
  Filled 2014-08-17: qty 1

## 2014-08-17 MED ORDER — CALCITRIOL 0.5 MCG PO CAPS
0.5000 ug | ORAL_CAPSULE | Freq: Every day | ORAL | Status: DC
  Administered 2014-08-17 – 2014-08-23 (×7): 0.5 ug via ORAL
  Filled 2014-08-17 (×7): qty 1

## 2014-08-17 MED ORDER — HEPARIN SODIUM (PORCINE) 5000 UNIT/ML IJ SOLN
4000.0000 [IU] | Freq: Once | INTRAMUSCULAR | Status: AC
  Administered 2014-08-17: 4000 [IU] via INTRAVENOUS

## 2014-08-17 NOTE — ED Notes (Signed)
Patient states chest pain started at 0300. Patient c/o burning and being "sore" in the center of chest. Patient also state nausea and vomiting with shortness of breath.

## 2014-08-17 NOTE — Progress Notes (Signed)
  Echocardiogram 2D Echocardiogram has been performed.  Randy [name] 08/17/2014, 12:42 PM

## 2014-08-17 NOTE — Interval H&P Note (Signed)
Cath Lab Visit (complete for each Cath Lab visit)  Clinical Evaluation Leading to the Procedure:   ACS: Yes.    Non-ACS:    Anginal Classification: CCS IV  Anti-ischemic medical therapy: Minimal Therapy (1 class of medications)  Non-Invasive Test Results: No non-invasive testing performed  Prior CABG: No previous CABG      History and Physical Interval Note:  08/17/2014 1:07 PM  Randy [name]  has presented today for surgery, with the diagnosis of NSTEMI  The various methods of treatment have been discussed with the patient and family. After consideration of risks, benefits and other options for treatment, the patient has consented to  Procedure(s): LEFT HEART CATHETERIZATION WITH CORONARY ANGIOGRAM (N/A) as a surgical intervention .  [name] history has been reviewed, patient examined, no change in status, stable for surgery.  I have reviewed the patient's chart and labs.  Questions were answered to the patient's satisfaction.     Randy [name] [employer]

## 2014-08-17 NOTE — Progress Notes (Signed)
CRITICAL VALUE ALERT  Critical value received:  Troponin I  Date of notification:  08/17/14  Time of notification: 5789  Critical value read back:Yes.    Nurse who received alert:  Dina Rich RN  MD not notified as previous Troponin was 3.52 and this is an expected decrease in the level.

## 2014-08-17 NOTE — ED Notes (Signed)
Patient states pain is worse when he breaths. Patient states he does dialysis at home. When asked about po fluid intake patient states he does not follow PO fluid intake limits.

## 2014-08-17 NOTE — ED Notes (Signed)
Heparin bolus completed

## 2014-08-17 NOTE — CV Procedure (Signed)
   Cardiac Catheterization Procedure Note  Name: Randy [name] MRN: [id number] DOB: 1932/01/27  Procedure: Selective Coronary Angiography  Indication: NSTEMI, ? Acute pericarditis   Procedural details: The right groin was prepped, draped, and anesthetized with 1% lidocaine. Using modified Seldinger technique, a 5 French sheath was introduced into the right femoral artery. Standard Judkins catheters were used for coronary angiography. Catheter exchanges were performed over a guidewire. There were no immediate procedural complications. The patient was transferred to the post catheterization recovery area for further monitoring.  Procedural Findings: Hemodynamics:  AO 129/76   Coronary angiography: Coronary dominance: right  Left mainstem: Short, no significant disease.   Left anterior descending (LAD): There was a moderate D1 that branched early with 40% ostial stenosis.  This was followed by a large septal perforator.  There was 40-50% proximal LAD stenosis at the takeoff of the septal perforator. 30% mid LAD stenosis at D2.   Left circumflex (LCx): There was a large ramus that branched early into a superior and and inferior division, both vessels were moderate in size.  Both divisions of the ramus had relatively up to 80% proximal stenosis.  The AV LCx itself had luminal irregularities.   Right coronary artery (RCA): There was an early large acute marginal with 40% proximal and 40% mid vessel stenosis.  The RCA had diffuse luminal irregularities and up to 40% mid vessel stenosis.   Left ventriculography: Not done, echo was done earlier today.   Final Conclusions:  Most significant disease appeared to be 80% proximal stenoses in the moderate superior and inferior divisions of the ramus. These lesions do not look like culprits for ACS (plaque rupture).  I think that the main process here is likely myopericarditis in the setting of renal disease and ?inadequate dialysis (does PD at home).   I will continue high dose aspirin 650 q8 hrs and will give 1 dose of colchicine.  Stop heparin drip with small percardial effusion and cycle troponin to peak.   Loralie Champagne 08/17/2014, 1:55 PM

## 2014-08-17 NOTE — ED Notes (Signed)
[facility] here for transport at this time. 

## 2014-08-17 NOTE — H&P (Signed)
Physician History and Physical    [name] MRN: [id number] DOB/AGE: [birth year] 78 y.o. Admit date: 08/17/2014  Primary Cardiologist: Dr. Domenic Polite  HPI: 66 with history of ESRD and cardiomyopathy of uncertain etiology (last EF 45-50%) presented to [name] with chest pain.  He was admitted in 9/15 with acute/chronic systolic CHF thought to be due to inadequate dialysis.  He had dialysis as an inpatient but also responded to IV Lasix (still makes urine).  Troponin was 0.73 that admission . At baseline, patient has dyspnea after walking about 50 feet.  No orthopnea.  He has occasional atypical chest pain.    On Monday and Tuesday, patient had mild chest pain on and off, no particular trigger.  This morning at 2 am, he developed constant, severe chest burning.  It is much worse with inspiration.  The pain has continued, NTG has not helped.  Troponin was 3.52.   Review of systems complete and found to be negative unless listed above   PMH: 1. ESRD: Has AV fistula but doing PD.  Nephrology = Dr. Lowanda Foster  2. HTN 3. Cardiomyopathy: ?nonischemic.  Last echo (9/15) with EF 40-45%, moderate LVH, diffuse hypokinesis, mild-moderate AS, PA systolic pressure 45 mmHg.  4. Anemia of renal disease 5. Hyperlipidemia 6. Aortic stenosis: Mild to moderate.   Family History  Problem Relation Age of Onset  . Arthritis    . Cancer    . Kidney disease    . Anesthesia problems Neg Hx   . Hypotension Neg Hx   . Malignant hyperthermia Neg Hx   . Pseudochol deficiency Neg Hx   . Cancer Sister   . Cancer Brother     colon  . Cancer Brother     colon    History   Social History  . Marital Status: Married    Spouse Name: N/A    Number of Children: N/A  . Years of Education: 10   Occupational History  .     Social History Main Topics  . Smoking status: Former Smoker -- 1.00 packs/day for 30 years    Types: Cigarettes    Quit date: 08/19/1998  . Smokeless tobacco: Former Systems developer    Quit date:  12/31/1993     Comment: quit cigs in 1999  . Alcohol Use: No  . Drug Use: No  . Sexual Activity: Yes    Birth Control/ Protection: None   Other Topics Concern  . Not on file   Social History Narrative     Prescriptions prior to admission  Medication Sig Dispense Refill Last Dose  . amLODipine (NORVASC) 10 MG tablet Take 1 tablet (10 mg total) by mouth daily. 30 tablet 3 08/16/2014 at Unknown time  . aspirin EC 81 MG EC tablet Take 1 tablet (81 mg total) by mouth daily. 30 tablet 3 08/16/2014 at Unknown time  . calcitRIOL (ROCALTROL) 0.5 MCG capsule Take 1 capsule (0.5 mcg total) by mouth daily. 30 capsule 3 08/16/2014 at Unknown time  . cloNIDine (CATAPRES) 0.2 MG tablet Take 1 tablet (0.2 mg total) by mouth 2 (two) times daily. 60 tablet 3 08/16/2014 at Unknown time  . hydrALAZINE (APRESOLINE) 50 MG tablet Take 1 tablet (50 mg total) by mouth 3 (three) times daily. 90 tablet 3 08/16/2014 at Unknown time  . labetalol (NORMODYNE) 200 MG tablet Take 2 tablets (400 mg total) by mouth 2 (two) times daily. 120 tablet 6 08/16/2014 at 1500  . multivitamin (RENA-VIT) TABS tablet Take 1 tablet  by mouth daily.   08/16/2014 at Unknown time  . rOPINIRole (REQUIP) 0.25 MG tablet Take 0.25-0.5 mg by mouth at bedtime.    08/16/2014 at Unknown time  . sevelamer carbonate (RENVELA) 800 MG tablet Take 2,400-4,000 mg by mouth 3 (three) times daily with meals. To take 5 with meals and 3 with snacks   08/16/2014 at Unknown time  . simvastatin (ZOCOR) 80 MG tablet Take 80 mg by mouth every Monday, Wednesday, and Friday.    08/16/2014 at Unknown time   Current Scheduled Meds: . amLODipine  10 mg Oral Daily  . aspirin  650 mg Oral 3 times per day  . atorvastatin  80 mg Oral q1800  . calcitRIOL  0.5 mcg Oral Daily  . cloNIDine  0.2 mg Oral BID  . furosemide  120 mg Intravenous Once  . hydrALAZINE  50 mg Oral TID  . labetalol  400 mg Oral BID  . morphine      . multivitamin  1 tablet Oral Daily  .  rOPINIRole  0.25-0.5 mg Oral QHS  . sevelamer carbonate  2,400-4,000 mg Oral TID WC  . sodium chloride  3 mL Intravenous Q12H   Continuous Infusions: . heparin 850 Units/hr (08/17/14 0753)   PRN Meds:.sodium chloride, acetaminophen, nitroGLYCERIN, nitroGLYCERIN, ondansetron (ZOFRAN) IV, sodium chloride   Physical Exam: Blood pressure 110/74, pulse 96, temperature 99.1 F (37.3 C), temperature source Oral, resp. rate 22, height 5\' 9"  (1.753 m), weight 160 lb (72.576 kg), SpO2 96 %.  General: NAD Neck: JVP 10-11 cm, no thyromegaly or thyroid nodule.  Lungs:Crackles 1/2 up bilaterally, soft end-expiratory wheezes CV: Nondisplaced PMI.  Heart regular S1/S2 with what sounds like a loud friction rub.  1+ ankle edema.  No carotid bruit.  Unable to palpate DP pulses.  Abdomen: Soft, nontender, no hepatosplenomegaly, no distention.  Skin: Intact without lesions or rashes.  Neurologic: Alert and oriented x 3.  Psych: Normal affect. Extremities: No clubbing or cyanosis.  HEENT: Normal.   Labs:   Lab Results  Component Value Date   WBC 9.9 08/17/2014   HGB 8.9* 08/17/2014   HCT 28.0* 08/17/2014   MCV 98.2 08/17/2014   PLT 308 08/17/2014    Recent Labs Lab 08/17/14 0625  NA 139  K 3.3*  CL 93*  CO2 27  BUN 70*  CREATININE 14.45*  CALCIUM 9.9  GLUCOSE 141*   Lab Results  Component Value Date   TROPONINI 3.52* 08/17/2014     Radiology: - CXR: pulmonary edema, left pleural effusion  EKG: NSR, LVH, PVCs  ASSESSMENT AND PLAN:  82 with history of ESRD and cardiomyopathy of uncertain etiology (last EF 45-50%) presented to [facility] with chest pain.  Troponin was elevated 3.52.  1. Chest pain/elevated troponin: Troponin elevated to 3.52 with very pleuritic chest pain.  He has a loud friction rub.  He has been doing PD at home.  I am concerned that this is myopericarditis, probably related to renal disease.  However, given ongoing chest, elevated troponin, and risk factors, cannot rule  out ACS with NSTEMI.   - Plan LHC, if no significant CAD stop heparin gtt . - Will treat for now with high dose ASA 650 q 8 hrs, 1st dose now.  - Echo now to assess for pericardial effusion/thickened pericardium.  - Cycle troponin to peak.  - Statin 2. Acute on chronic systolic CHF: Patient is volume overloaded on exam and short of breath.  I am not sure that PD is adequate  for him.   - Lasix 120 mg IV now prior to cath lab as he still makes urine.  - Renal consult for dialysis/fluid removal.  He has AV fistula though he does PD at home.  3. ESRD: Does PD at home.  May need to convert to HD for better volume control.  Consult nephrology, would like dialysis after cath.   Loralie Champagne 08/17/2014 12:31 PM

## 2014-08-17 NOTE — ED Notes (Addendum)
CRITICAL VALUE ALERT  Critical value received:  Troponin 3.52  Date of notification:  08/17/14  Time of notification:  0643  Critical value read back:Yes.    Nurse who received alert:  Holli Humbles  MD notified (1st page):  Molpus  Time of first page:  [phone]  MD notified (2nd page):  Time of second page:  Responding MD:  Molpus  Time MD responded:  [phone]

## 2014-08-17 NOTE — Progress Notes (Signed)
ANTICOAGULATION CONSULT NOTE - Initial Consult  Pharmacy Consult for Heparin Indication: chest pain/ACS  No Known Allergies  Patient Measurements: Height: 5\' 9"  (175.3 cm) Weight: 160 lb (72.576 kg) IBW/kg (Calculated) : 70.7  Vital Signs: Temp: 98.1 F (36.7 C) (11/25 0607) Temp Source: Oral (11/25 0607) BP: 178/110 mmHg (11/25 0730) Pulse Rate: 93 (11/25 0730)  Labs:  Recent Labs  08/17/14 0625  HGB 8.9*  HCT 28.0*  PLT 308  CREATININE 14.45*  TROPONINI 3.52*    Estimated Creatinine Clearance: 4.1 mL/min (by C-G formula based on Cr of 14.45).   Medical History: Past Medical History  Diagnosis Date  . Hypertensive heart disease   . Hypercholesterolemia   . Gout   . Secondary cardiomyopathy     LVEF 40-45%  . Anemia of chronic disease   . Hypertension   . Arthritis   . End stage renal disease     Most recently using peritoneal dialysis  . Peripheral vascular disease     Medications:  Scheduled:  . morphine        Assessment: 80 yo M with ESRD on peritoneal dialysis and NICM presented with chest pain.  Troponins & ProBNP elevated.  No bleeding noted.  CBC reviewed.  Heparin started for treatment of possible NSTEMI.  He has received 4000 units heparin bolus & infusion currently at 500 units/hr per EDP orders.  Currently not thought to be candidate for cardiac cath.  Plan tx to CICU at [facility].   Goal of Therapy:  Heparin level 0.3-0.7 units/ml Monitor platelets by anticoagulation protocol: Yes   Plan:  Increase heparin infusion to 850 units/hr (~12 units/kg/hr) Check anti-Xa level in 8 hours and daily while on heparin Continue to monitor H&H and platelets  Maleah Rabago, Lavonia Drafts 08/17/2014,7:48 AM

## 2014-08-17 NOTE — ED Provider Notes (Signed)
CSN: [id number]     Arrival date & time 08/17/14  0554 History   First MD Initiated Contact with Patient 08/17/14 0615     Chief Complaint  Patient presents with  . Chest Pain     (Consider location/radiation/quality/duration/timing/severity/associated sxs/prior Treatment) HPI    80 y.o.male with history of systolic dysfunction, EF of 40-45% per echo in 05/2014, suspected NICM, hypertension, ESRD presenting with CP since this morning. Recent admit with what was felt to be demand ischemia in setting of hypoxic respiratory failure with known systolic CHF/renal failure. Troponins then mildly elevated around 0.5-0.70. His blood pressure was better controlled, had 3.9 liter output with IV lasix (not totally oliguric) and dialysis and discharged in compensated state. Reports he has been doing pretty well since then up until early this morning around 0300. Began having chest pain across precordium. Describes as pressure and burning. Pretty constant since. No appreciable exacerbating or relieving factors. Associated nausea and vomited x1. Mild SOB. Reports compliance with medications and peritoneal dialysis. No dietary discretion though as he admits he does not attempt to follow cardiac/renal dietary recommendations or monitor fluid intake.   Past Medical History  Diagnosis Date  . Hypertensive heart disease   . Hypercholesterolemia   . Gout   . Secondary cardiomyopathy     LVEF 40-45%  . Anemia of chronic disease   . Hypertension   . Arthritis   . End stage renal disease     Most recently using peritoneal dialysis  . Peripheral vascular disease    Past Surgical History  Procedure Laterality Date  . Knee surgery  2007    Right knee, arthroscopy  . Back surgery  2004    lumbar  . Hemorrhoid surgery  40 years ago  . Ganglion cyst excision  01/03/2012    Procedure: REMOVAL GANGLION OF WRIST;  Surgeon: Scherry Ran, MD;  Location: [facility];  Service: General;  Laterality: Right;  .  Colonscopy    . Av fistula placement  08/24/2012    Procedure: ARTERIOVENOUS (AV) FISTULA CREATION;  Surgeon: Rosetta Posner, MD;  Location: [facility];  Service: Vascular;  Laterality: Left;  . Insertion of dialysis catheter      right neck  . Insertion of dialysis catheter  10/19/2012    Procedure: INSERTION OF DIALYSIS CATHETER;  Surgeon: Rosetta Posner, MD;  Location: [facility] OR;  Service: Vascular;  Laterality: N/A;  REMOVE TEMPORARY CATH   Family History  Problem Relation Age of Onset  . Arthritis    . Cancer    . Kidney disease    . Anesthesia problems Neg Hx   . Hypotension Neg Hx   . Malignant hyperthermia Neg Hx   . Pseudochol deficiency Neg Hx   . Cancer Sister   . Cancer Brother     colon  . Cancer Brother     colon   History  Substance Use Topics  . Smoking status: Former Smoker -- 1.00 packs/day for 30 years    Types: Cigarettes    Quit date: 08/19/1998  . Smokeless tobacco: Former Systems developer    Quit date: 12/31/1993     Comment: quit cigs in 1999  . Alcohol Use: No    Review of Systems  All systems reviewed and negative, other than as noted in HPI.   Allergies  Review of patient's allergies indicates no known allergies.  Home Medications   Prior to Admission medications   Medication Sig Start Date End Date Taking? Authorizing Provider  amLODipine (NORVASC) 10 MG tablet Take 1 tablet (10 mg total) by mouth daily. 06/23/14  Yes [name], MD  aspirin EC 81 MG EC tablet Take 1 tablet (81 mg total) by mouth daily. 06/23/14  Yes [name], MD  calcitRIOL (ROCALTROL) 0.5 MCG capsule Take 1 mcg by mouth daily.   Yes Historical Provider, MD  calcitRIOL (ROCALTROL) 0.5 MCG capsule Take 1 capsule (0.5 mcg total) by mouth daily. 06/23/14  Yes [name], MD  cloNIDine (CATAPRES) 0.2 MG tablet Take 1 tablet (0.2 mg total) by mouth 2 (two) times daily. 08/29/12  Yes [name], MD  hydrALAZINE (APRESOLINE) 50 MG tablet Take 1 tablet (50 mg total) by mouth  3 (three) times daily. 06/27/12  Yes [name], MD  labetalol (NORMODYNE) 200 MG tablet Take 2 tablets (400 mg total) by mouth 2 (two) times daily. 07/17/12  Yes Satira Sark, MD  multivitamin (RENA-VIT) TABS tablet Take 1 tablet by mouth daily.   Yes Historical Provider, MD  rOPINIRole (REQUIP) 0.25 MG tablet Take 0.25-0.5 mg by mouth at bedtime.    Yes Historical Provider, MD  sevelamer carbonate (RENVELA) 800 MG tablet Take 2,400-4,000 mg by mouth 3 (three) times daily with meals. To take 5 with meals and 3 with snacks   Yes Historical Provider, MD  simvastatin (ZOCOR) 80 MG tablet Take 80 mg by mouth every Monday, Wednesday, and Friday.    Yes Historical Provider, MD   BP 163/92 mmHg  Pulse 97  Temp(Src) 98.1 F (36.7 C) (Oral)  Resp 22  Ht 5\' 9"  (1.753 m)  Wt 160 lb (72.576 kg)  BMI 23.62 kg/m2  SpO2 98% Physical Exam  Constitutional: He appears well-developed and well-nourished.  Laying in bed. Appears somewhat uncomfortable.   HENT:  Head: Normocephalic and atraumatic.  Eyes: Conjunctivae are normal. Right eye exhibits no discharge. Left eye exhibits no discharge.  Neck: Neck supple.  Cardiovascular: Normal rate and regular rhythm.  Exam reveals no gallop and no friction rub.   Murmur heard. Fistula L forearm  Pulmonary/Chest: Effort normal and breath sounds normal. No respiratory distress.  Abdominal: Soft.  Peritoneal dialaysis cath. Soft. NT. ND.   Musculoskeletal: He exhibits no edema or tenderness.  Neurological: He is alert.  Skin: Skin is warm and dry.  Psychiatric: He has a normal mood and affect. His behavior is normal. Thought content normal.  Nursing note and vitals reviewed.   ED Course  Procedures (including critical care time)  CRITICAL CARE Performed by: Virgel Manifold   Total critical care time: 35 minutes  Critical care time was exclusive of separately billable procedures and treating other patients. Critical care was necessary to  treat or prevent imminent or life-threatening deterioration. Critical care was time spent personally by me on the following activities: development of treatment plan with patient and/or surrogate as well as nursing, discussions with consultants, evaluation of patient's response to treatment, examination of patient, obtaining history from patient or surrogate, ordering and performing treatments and interventions, ordering and review of laboratory studies, ordering and review of radiographic studies, pulse oximetry and re-evaluation of patient's condition.  Labs Review Labs Reviewed  TROPONIN I - Abnormal; Notable for the following:    Troponin I 3.52 (*)    All other components within normal limits  BASIC METABOLIC PANEL - Abnormal; Notable for the following:    Potassium 3.3 (*)    Chloride 93 (*)    Glucose, Bld 141 (*)  BUN 70 (*)    Creatinine, Ser 14.45 (*)    GFR calc non Af Amer 3 (*)    GFR calc Af Amer 3 (*)    Anion gap 19 (*)    All other components within normal limits  CBC WITH DIFFERENTIAL - Abnormal; Notable for the following:    RBC 2.85 (*)    Hemoglobin 8.9 (*)    HCT 28.0 (*)    RDW 16.7 (*)    Neutrophils Relative % 81 (*)    Neutro Abs 8.1 (*)    Lymphocytes Relative 8 (*)    All other components within normal limits  PRO B NATRIURETIC PEPTIDE - Abnormal; Notable for the following:    Pro B Natriuretic peptide (BNP) >70000.0 (*)    All other components within normal limits    Imaging Review Dg Chest Port 1 View  08/17/2014   CLINICAL DATA:  Chest pain.  EXAM: PORTABLE CHEST - 1 VIEW  COMPARISON:  06/21/2014  FINDINGS: Unchanged cardiomegaly. Mild aortic tortuosity which is also stable. Ongoing pulmonary edema with small to moderate left pleural effusion. No pneumothorax.  IMPRESSION: Unchanged pulmonary edema and left pleural effusion.   Electronically Signed   By: Jorje Guild M.D.   On: 08/17/2014 06:33     EKG Interpretation   Date/Time:  Wednesday  August 17 2014 06:04:26 EST Ventricular Rate:  100 PR Interval:  206 QRS Duration: 102 QT Interval:  363 QTC Calculation: 468 R Axis:   73 Text Interpretation:  Sinus tachycardia Multiform ventricular premature  complexes Probable left atrial enlargement LVH with secondary  repolarization abnormality Baseline wander in lead(s) V6 No significant  change was found Confirmed by MOLPUS  MD, Jenny Reichmann ([id number]) on 08/17/2014  6:07:50 AM      MDM   Final diagnoses:  Chest pain  NSTEMI (non-ST elevated myocardial infarction)    82yM with CP. Significantly elevated troponin. EKG sinus with PVCs, LVH,  nonspecific repol abnormalities. Not overly changed from previous. Continued pain. ASA, heparin, nitro gtt, morphine. Small dose beta blocker with known systolic dysfunction. CXR with some pulmonary edema. He is primarily complaining of CP though. Appears to be compensating to so degree currently. With elevation in troponin to this level, will discuss with cardiology at [facility] for possible transfer.   Discussed with Dr Burt Knack, cardiology. Transfer to [facility].      Virgel Manifold, MD 08/17/14 [phone]

## 2014-08-17 NOTE — Progress Notes (Addendum)
Site area: right groin a 5 french sheath was removed  Site Prior to Removal:  Level 0  Pressure Applied For 20 MINUTES    Minutes Beginning at 1405  Manual:   Yes.    Patient Status During Pull:  stable  Post Pull Groin Site:  Level 0  Post Pull Instructions Given:  Yes.    Post Pull Pulses Present:  Yes.    Dressing Applied:  Yes.    Comments:  Pt remain stable during sheath pull. Pt resting with eyes closed no distress noted at this time

## 2014-08-17 NOTE — Progress Notes (Signed)
Patient reports midsternal burning pain increasing with breathing, high but not the worst he's every had. States it has improved alitte since admitted to [facility] ER. Achy sore pain throughout chest and shoulders worsening with breathing. Shortness of breath worse then usual.

## 2014-08-17 NOTE — ED Notes (Signed)
Report called to [place], [facility]. ETA 25 min to facility. Report attempted with no answer for cardiac ICU at [facility].

## 2014-08-18 DIAGNOSIS — I309 Acute pericarditis, unspecified: Secondary | ICD-10-CM | POA: Diagnosis present

## 2014-08-18 DIAGNOSIS — R7989 Other specified abnormal findings of blood chemistry: Secondary | ICD-10-CM

## 2014-08-18 LAB — BASIC METABOLIC PANEL
ANION GAP: 20 — AB (ref 5–15)
BUN: 81 mg/dL — ABNORMAL HIGH (ref 6–23)
CO2: 23 mEq/L (ref 19–32)
Calcium: 9.1 mg/dL (ref 8.4–10.5)
Chloride: 91 mEq/L — ABNORMAL LOW (ref 96–112)
Creatinine, Ser: 14.69 mg/dL — ABNORMAL HIGH (ref 0.50–1.35)
GFR, EST AFRICAN AMERICAN: 3 mL/min — AB (ref >90)
GFR, EST NON AFRICAN AMERICAN: 3 mL/min — AB (ref >90)
Glucose, Bld: 135 mg/dL — ABNORMAL HIGH (ref 70–99)
Potassium: 4.1 mEq/L (ref 3.7–5.3)
SODIUM: 134 meq/L — AB (ref 137–147)

## 2014-08-18 LAB — LIPID PANEL
CHOLESTEROL: 125 mg/dL (ref 0–200)
HDL: 72 mg/dL (ref >39)
LDL Cholesterol: 44 mg/dL (ref 0–99)
Total CHOL/HDL Ratio: 1.7 RATIO
Triglycerides: 43 mg/dL (ref <150)
VLDL: 9 mg/dL (ref 0–40)

## 2014-08-18 LAB — CBC
HEMATOCRIT: 23.6 % — AB (ref 39.0–52.0)
HEMOGLOBIN: 7.2 g/dL — AB (ref 13.0–17.0)
MCH: 29.5 pg (ref 26.0–34.0)
MCHC: 30.5 g/dL (ref 30.0–36.0)
MCV: 96.7 fL (ref 78.0–100.0)
PLATELETS: 276 10*3/uL (ref 150–400)
RBC: 2.44 MIL/uL — ABNORMAL LOW (ref 4.22–5.81)
RDW: 16.5 % — ABNORMAL HIGH (ref 11.5–15.5)
WBC: 9.2 10*3/uL (ref 4.0–10.5)

## 2014-08-18 LAB — TROPONIN I: Troponin I: 2.22 ng/mL (ref <0.30)

## 2014-08-18 MED ORDER — HEPARIN SODIUM (PORCINE) 1000 UNIT/ML DIALYSIS
1000.0000 [IU] | INTRAMUSCULAR | Status: DC | PRN
  Filled 2014-08-18: qty 1

## 2014-08-18 MED ORDER — ALTEPLASE 2 MG IJ SOLR
2.0000 mg | Freq: Once | INTRAMUSCULAR | Status: AC | PRN
  Filled 2014-08-18: qty 2

## 2014-08-18 MED ORDER — SODIUM CHLORIDE 0.9 % IV SOLN: 100.0000 mL | INTRAVENOUS | Status: DC | PRN

## 2014-08-18 MED ORDER — LIDOCAINE-PRILOCAINE 2.5-2.5 % EX CREA: 1.0000 "application " | TOPICAL_CREAM | CUTANEOUS | Status: DC | PRN

## 2014-08-18 MED ORDER — NEPRO/CARBSTEADY PO LIQD: 237.0000 mL | ORAL | Status: DC | PRN

## 2014-08-18 MED ORDER — DARBEPOETIN ALFA 150 MCG/0.3ML IJ SOSY
150.0000 ug | PREFILLED_SYRINGE | INTRAMUSCULAR | Status: DC
  Administered 2014-08-19: 150 ug via INTRAVENOUS
  Filled 2014-08-18: qty 0.3

## 2014-08-18 MED ORDER — LIDOCAINE HCL (PF) 1 % IJ SOLN: 5.0000 mL | INTRAMUSCULAR | Status: DC | PRN

## 2014-08-18 MED ORDER — PENTAFLUOROPROP-TETRAFLUOROETH EX AERO: 1.0000 "application " | INHALATION_SPRAY | CUTANEOUS | Status: DC | PRN

## 2014-08-18 MED ORDER — GUAIFENESIN-DM 100-10 MG/5ML PO SYRP
15.0000 mL | ORAL_SOLUTION | ORAL | Status: DC | PRN
  Administered 2014-08-18: 15 mL via ORAL
  Filled 2014-08-18 (×2): qty 15

## 2014-08-18 NOTE — Progress Notes (Signed)
Primary cardiologist: Dr. Satira Sark  Seen for followup: Myopericarditis  Subjective:    Feels better today, sitting up in chair. Ate breakfast. No shortness of breath.  Objective:   Temp:  [97.6 F (36.4 C)-99.1 F (37.3 C)] 97.7 F (36.5 C) (11/26 0813) Pulse Rate:  [72-97] 75 (11/26 0813) Resp:  [7-23] 18 (11/26 0813) BP: (78-170)/(39-123) 93/56 mmHg (11/26 0813) SpO2:  [95 %-100 %] 99 % (11/26 0813) Weight:  [160 lb 15 oz (73 kg)] 160 lb 15 oz (73 kg) (11/26 0027) Last BM Date: 08/16/14  Filed Weights   08/17/14 0607 08/18/14 0027  Weight: 160 lb (72.576 kg) 160 lb 15 oz (73 kg)    Intake/Output Summary (Last 24 hours) at 08/18/14 0829 Last data filed at 08/18/14 0813  Gross per 24 hour  Intake    413 ml  Output    150 ml  Net    263 ml    Telemetry: Sinus rhythm.  Exam:  General: No distress.  Lungs: Clear, nonlabored.  Cardiac: RRR, single component friction rub when leaning forward, no gallop.  Abdomen: NABS.  Extremities: No pitting.   Lab Results:  Basic Metabolic Panel:  Recent Labs Lab 08/17/14 0625 08/17/14 1904 08/18/14 0326  NA 139  --  134*  K 3.3*  --  4.1  CL 93*  --  91*  CO2 27  --  23  GLUCOSE 141*  --  135*  BUN 70*  --  81*  CREATININE 14.45* 14.07* 14.69*  CALCIUM 9.9  --  9.1    CBC:  Recent Labs Lab 08/17/14 0625 08/17/14 1904 08/18/14 0326  WBC 9.9 10.4 9.2  HGB 8.9* 7.0* 7.2*  HCT 28.0* 22.1* 23.6*  MCV 98.2 97.8 96.7  PLT 308 277 276    Cardiac Enzymes:  Recent Labs Lab 08/17/14 1500 08/17/14 2049 08/18/14 0326  TROPONINI 1.75* 2.56* 2.22*    BNP:  Recent Labs  06/21/14 1046 08/17/14 0625  PROBNP >70000.0* >70000.0*    Coagulation:  Recent Labs Lab 08/17/14 0625  INR 1.16    ECG: Sinus rhythm with PVC, LVH with repolarization abnormalities, anterolateral ST-T wave abnormalities.   Medications:   Scheduled Medications: . amLODipine  10 mg Oral Daily  . aspirin   650 mg Oral 3 times per day  . atorvastatin  80 mg Oral q1800  . calcitRIOL  0.5 mcg Oral Daily  . cloNIDine  0.2 mg Oral BID  . heparin  5,000 Units Subcutaneous 3 times per day  . hydrALAZINE  50 mg Oral TID  . labetalol  400 mg Oral BID  . multivitamin  1 tablet Oral QHS  . rOPINIRole  0.25-0.5 mg Oral QHS  . sevelamer carbonate  4,000 mg Oral TID WC  . sodium chloride  3 mL Intravenous Q12H     PRN Medications:  sodium chloride, acetaminophen, guaiFENesin-dextromethorphan, nitroGLYCERIN, ondansetron (ZOFRAN) IV, sevelamer carbonate, sodium chloride   Assessment:   1. Suspected myopericarditis with abnormal troponin I level. Cardiac catheterization on 11/25 demonstrated CAD that was mild to moderate in the major epicardial with an area of 80% stenosis in a ramus branch that was not felt to be a culprit lesion. He is on high-dose aspirin at this time.  2. Overall moderate CAD, to be managed medically at this time.  3. LVEF 30-35% with diffuse hypokinesis, likely nonischemic based on recent cardiac catheterization findings. Would not pursue evaluation for device options at this time.  4. End-stage renal  disease, recently on peritoneal dialysis, followed by Dr. Lowanda Foster. Nephrology consultation pending. Question whether he may need hemodialysis sessions for more adequate dialysis.  5. History of medication noncompliance based on chart review.   Plan/Discussion:    Continue high-dose aspirin, holding off colchicine with renal impairment and uncertain adequacy of dialysis. Otherwise continue Norvasc, Lipitor, hydralazine, labetalol, and clonidine. Nephrology consultation requested. Depending on clinical course and need for hemodialysis, patient may be able to go home in the next 24-48 hours.   Satira Sark, M.D., F.A.C.C.

## 2014-08-18 NOTE — Progress Notes (Signed)
Pt tranferred to 6E03 from 2600.  Pt alert and oriented. VSS. Denies pain. Pleasant.

## 2014-08-18 NOTE — Consult Note (Signed)
Referring Provider: No ref. provider found Primary Care Physician:  [name], MD Primary Nephrologist:  Dr Tracey Harries.   Reason for Consultation:  ESRD  Uremic pericarditis  anemia HPI:  ESRD x 1 year history of HTN and cardiomyopathy. 53 with history of ESRD and cardiomyopathy of uncertain etiology (last EF 45-50%) presented to [facility] with chest pain. He was admitted in 9/15 with acute/chronic systolic CHF thought to be due to inadequate dialysis. Admitted with chest pain. 2D Echo There was a left pleural effusion. Small circumferential pericardial effusion . The pericardium did appearthickened. The estimated ejection fraction was in the range of 30% to 35%. He performs peritoneal dialysis at home although there is a question regarding the adequacy of dialysis. The catheterization demonstrated mild CAD and the troponin elevation was thought to be secondary to myocarditis. This may be due to uremia and inadequate dialyisis  Past Medical History  Diagnosis Date  . Hypertensive heart disease   . Hypercholesterolemia   . Gout   . Secondary cardiomyopathy     LVEF 40-45%  . Anemia of chronic disease   . Hypertension   . Arthritis   . Peripheral vascular disease   . ESRD on peritoneal dialysis   . On home oxygen therapy     "use what I need to" (08/17/2014)  . Pneumonia 09/2012  . Pericarditis 08/17/2014  . NSTEMI (non-ST elevated myocardial infarction) 08/17/2014    Past Surgical History  Procedure Laterality Date  . Knee arthroscopy Right 2007  . Back surgery    . Hemorrhoid surgery  1970's  . Ganglion cyst excision  01/03/2012    Procedure: REMOVAL GANGLION OF WRIST;  Surgeon: Scherry Ran, MD;  Location: [facility];  Service: General;  Laterality: Right;  . Colonoscopy    . Av fistula placement  08/24/2012    Procedure: ARTERIOVENOUS (AV) FISTULA CREATION;  Surgeon: Rosetta Posner, MD;  Location: [facility];  Service: Vascular;  Laterality: Left;  . Insertion of dialysis  catheter Right      neck  . Insertion of dialysis catheter  10/19/2012    Procedure: INSERTION OF DIALYSIS CATHETER;  Surgeon: Rosetta Posner, MD;  Location: [place];  Service: Vascular;  Laterality: N/A;  REMOVE TEMPORARY CATH  . Cardiac catheterization  08/17/2014  . Cholecystectomy    . Lumbar disc surgery  2004    Prior to Admission medications   Medication Sig Start Date End Date Taking? Authorizing Provider  amLODipine (NORVASC) 10 MG tablet Take 1 tablet (10 mg total) by mouth daily. 06/23/14  Yes [name], MD  aspirin EC 81 MG EC tablet Take 1 tablet (81 mg total) by mouth daily. 06/23/14  Yes [name], MD  calcitRIOL (ROCALTROL) 0.5 MCG capsule Take 1 capsule (0.5 mcg total) by mouth daily. 06/23/14  Yes [name], MD  cloNIDine (CATAPRES) 0.2 MG tablet Take 1 tablet (0.2 mg total) by mouth 2 (two) times daily. 08/29/12  Yes [name], MD  hydrALAZINE (APRESOLINE) 50 MG tablet Take 1 tablet (50 mg total) by mouth 3 (three) times daily. 06/27/12  Yes [name], MD  labetalol (NORMODYNE) 200 MG tablet Take 2 tablets (400 mg total) by mouth 2 (two) times daily. 07/17/12  Yes Satira Sark, MD  multivitamin (RENA-VIT) TABS tablet Take 1 tablet by mouth daily.   Yes Historical Provider, MD  rOPINIRole (REQUIP) 0.25 MG tablet Take 0.25-0.5 mg by mouth at bedtime.    Yes Historical Provider,  MD  sevelamer carbonate (RENVELA) 800 MG tablet Take 2,400-4,000 mg by mouth 3 (three) times daily with meals. To take 5 with meals and 3 with snacks   Yes Historical Provider, MD  simvastatin (ZOCOR) 80 MG tablet Take 80 mg by mouth every Monday, Wednesday, and Friday.    Yes Historical Provider, MD    Current Facility-Administered Medications  Medication Dose Route Frequency Provider Last Rate Last Dose  . 0.9 %  sodium chloride infusion  250 mL Intravenous PRN Larey Dresser, MD      . acetaminophen (TYLENOL) tablet 650 mg  650 mg Oral Q4H PRN Larey Dresser, MD   650 mg at 08/18/14 0226  . amLODipine (NORVASC) tablet 10 mg  10 mg Oral Daily Larey Dresser, MD   10 mg at 08/17/14 1502  . aspirin tablet 650 mg  650 mg Oral 3 times per day Larey Dresser, MD   650 mg at 08/18/14 [phone]  . atorvastatin (LIPITOR) tablet 80 mg  80 mg Oral q1800 Larey Dresser, MD   80 mg at 08/17/14 1725  . calcitRIOL (ROCALTROL) capsule 0.5 mcg  0.5 mcg Oral Daily Larey Dresser, MD   0.5 mcg at 08/17/14 1500  . cloNIDine (CATAPRES) tablet 0.2 mg  0.2 mg Oral BID Larey Dresser, MD   0.2 mg at 08/17/14 1500  . guaiFENesin-dextromethorphan (ROBITUSSIN DM) 100-10 MG/5ML syrup 15 mL  15 mL Oral Q4H PRN Larey Dresser, MD   15 mL at 08/18/14 0223  . heparin injection 5,000 Units  5,000 Units Subcutaneous 3 times per day Larey Dresser, MD   5,000 Units at 08/18/14 [phone]  . hydrALAZINE (APRESOLINE) tablet 50 mg  50 mg Oral TID Larey Dresser, MD   50 mg at 08/17/14 1500  . labetalol (NORMODYNE) tablet 400 mg  400 mg Oral BID Larey Dresser, MD   400 mg at 08/17/14 2235  . multivitamin (RENA-VIT) tablet 1 tablet  1 tablet Oral QHS Larey Dresser, MD   1 tablet at 08/17/14 2234  . nitroGLYCERIN (NITROSTAT) SL tablet 0.4 mg  0.4 mg Sublingual Q5 min PRN Karen Chafe Molpus, MD   0.4 mg at 08/17/14 0636  . ondansetron (ZOFRAN) injection 4 mg  4 mg Intravenous Q6H PRN Larey Dresser, MD      . rOPINIRole (REQUIP) tablet 0.25-0.5 mg  0.25-0.5 mg Oral QHS Larey Dresser, MD   0.25 mg at 08/17/14 2234  . sevelamer carbonate (RENVELA) tablet 2,400 mg  2,400 mg Oral PRN Larey Dresser, MD   2,400 mg at 08/17/14 2347  . sevelamer carbonate (RENVELA) tablet 4,000 mg  4,000 mg Oral TID WC Larey Dresser, MD   4,000 mg at 08/17/14 1724  . sodium chloride 0.9 % injection 3 mL  3 mL Intravenous Q12H Larey Dresser, MD   3 mL at 08/17/14 2240  . sodium chloride 0.9 % injection 3 mL  3 mL Intravenous PRN Larey Dresser, MD        Allergies as of 08/17/2014  . (No Known  Allergies)    Family History  Problem Relation Age of Onset  . Arthritis    . Cancer    . Kidney disease    . Anesthesia problems Neg Hx   . Hypotension Neg Hx   . Malignant hyperthermia Neg Hx   . Pseudochol deficiency Neg Hx   . Cancer Sister   . Cancer Brother  colon  . Cancer Brother     colon    History   Social History  . Marital Status: Married    Spouse Name: N/A    Number of Children: N/A  . Years of Education: 10   Occupational History  .     Social History Main Topics  . Smoking status: Former Smoker -- 1.00 packs/day for 30 years    Types: Cigarettes    Quit date: 08/19/1998  . Smokeless tobacco: Former Systems developer    Types: Chew    Quit date: 12/31/1993  . Alcohol Use: No  . Drug Use: No  . Sexual Activity: No   Other Topics Concern  . Not on file   Social History Narrative    Review of Systems: Gen: Denies any fever, chills, sweats, anorexia, fatigue, weakness, malaise, weight loss, and sleep disorder HEENT: No visual complaints, No history of Retinopathy. Normal external appearance No Epistaxis or Sore throat. No sinusitis.   CV: Denies chest pain, angina, palpitations, syncope, orthopnea, PND, peripheral edema, and claudication. Resp: Denies dyspnea at rest, dyspnea with exercise, cough, sputum, wheezing, coughing up blood, and pleurisy. GI: Denies vomiting blood, jaundice, and fecal incontinence.   Denies dysphagia or odynophagia. GU : Denies urinary burning, blood in urine, urinary frequency, urinary hesitancy, nocturnal urination, and urinary incontinence.  No renal calculi. MS: Denies joint pain, limitation of movement, and swelling, stiffness, low back pain, extremity pain. Denies muscle weakness, cramps, atrophy.  No use of non steroidal antiinflammatory drugs. Derm: Denies rash, itching, dry skin, hives, moles, warts, or unhealing ulcers.  Psych: Denies depression, anxiety, memory loss, suicidal ideation, hallucinations, paranoia, and  confusion. Heme: Denies bruising, bleeding, and enlarged lymph nodes. Neuro: No headache.  No diplopia. No dysarthria.  No dysphasia.  No history of CVA.  No Seizures. No paresthesias.  No weakness. Endocrine No DM.  No Thyroid disease.  No Adrenal disease.  Physical Exam: Vital signs in last 24 hours: Temp:  [97.6 F (36.4 C)-99.1 F (37.3 C)] 97.7 F (36.5 C) (11/26 0813) Pulse Rate:  [72-97] 75 (11/26 0813) Resp:  [7-22] 18 (11/26 0813) BP: (78-160)/(39-123) 93/56 mmHg (11/26 0813) SpO2:  [95 %-100 %] 99 % (11/26 0813) Weight:  [73 kg (160 lb 15 oz)] 73 kg (160 lb 15 oz) (11/26 0027) Last BM Date: 08/16/14 General:   Alert,  Elderly  Head:  Normocephalic and atraumatic. Eyes:  Sclera clear, no icterus.   Conjunctiva pink. Ears:  Normal auditory acuity. Nose:  No deformity, discharge,  or lesions. Mouth:  No deformity or lesions, dentition normal. Neck:  Supple; no masses or thyromegaly. JVP not elevated Lungs:  Clear throughout to auscultation.  Slightly diminished Heart:  Regular rate and rhythm; systolic murmur and uremic rub Abdomen:  Soft, nontender and nondistended. No masses, hepatosplenomegaly or hernias noted. Normal bowel sounds, without guarding, and without rebound.   PD catheter no exudate or redness Msk:  Symmetrical without gross deformities. Normal posture. Pulses:  No carotid, renal, femoral bruits. DP and PT symmetrical and equal Extremities:  Without clubbing or edema. Left AVF Neurologic:  Alert and  oriented x4;  grossly normal neurologically. Skin:  Intact without significant lesions or rashes. Cervical Nodes:  No significant cervical adenopathy. Psych:  Alert and cooperative. Normal mood and affect.  Intake/Output from previous day: 11/25 0701 - 11/26 0700 In: 133 [P.O.:120; I.V.:13] Out: 150 [Urine:150] Intake/Output this shift: Total I/O In: 280 [P.O.:280] Out: -   Lab Results:  Recent Labs  08/17/14 0625 08/17/14 1904 08/18/14 0326  WBC  9.9 10.4 9.2  HGB 8.9* 7.0* 7.2*  HCT 28.0* 22.1* 23.6*  PLT 308 277 276   BMET  Recent Labs  08/17/14 0625 08/17/14 1904 08/18/14 0326  NA 139  --  134*  K 3.3*  --  4.1  CL 93*  --  91*  CO2 27  --  23  GLUCOSE 141*  --  135*  BUN 70*  --  81*  CREATININE 14.45* 14.07* 14.69*  CALCIUM 9.9  --  9.1   LFT No results for input(s): PROT, ALBUMIN, AST, ALT, ALKPHOS, BILITOT, BILIDIR, IBILI in the last 72 hours. PT/INR  Recent Labs  08/17/14 0625  LABPROT 15.0  INR 1.16   Hepatitis Panel No results for input(s): HEPBSAG, HCVAB, HEPAIGM, HEPBIGM in the last 72 hours.  Studies/Results: Dg Chest Port 1 View  08/17/2014   CLINICAL DATA:  Chest pain.  EXAM: PORTABLE CHEST - 1 VIEW  COMPARISON:  06/21/2014  FINDINGS: Unchanged cardiomegaly. Mild aortic tortuosity which is also stable. Ongoing pulmonary edema with small to moderate left pleural effusion. No pneumothorax.  IMPRESSION: Unchanged pulmonary edema and left pleural effusion.   Electronically Signed   By: Jorje Guild M.D.   On: 08/17/2014 06:33    Assessment/Plan:  ESRD- with suspected myopericarditis due to inadequate dialysis and will proceed with intermittent dialysis  ANEMIA- Will give ESA and check iron stores  MBD- check phosphorus  HTN/VOL low BP may have difficulty tolerating   ACCESS- Transition to hemodialysis for now    LOS: 1 Alese Furniss W @TODAY @9 :47 AM

## 2014-08-19 DIAGNOSIS — J441 Chronic obstructive pulmonary disease with (acute) exacerbation: Secondary | ICD-10-CM

## 2014-08-19 LAB — IRON AND TIBC
IRON: 20 ug/dL — AB (ref 42–135)
Saturation Ratios: 14 % — ABNORMAL LOW (ref 20–55)
TIBC: 140 ug/dL — AB (ref 215–435)
UIBC: 120 ug/dL — ABNORMAL LOW (ref 125–400)

## 2014-08-19 LAB — CBC
HEMATOCRIT: 21.5 % — AB (ref 39.0–52.0)
Hemoglobin: 6.8 g/dL — CL (ref 13.0–17.0)
MCH: 29.7 pg (ref 26.0–34.0)
MCHC: 31.6 g/dL (ref 30.0–36.0)
MCV: 93.9 fL (ref 78.0–100.0)
PLATELETS: 243 10*3/uL (ref 150–400)
RBC: 2.29 MIL/uL — ABNORMAL LOW (ref 4.22–5.81)
RDW: 16.4 % — AB (ref 11.5–15.5)
WBC: 8.3 10*3/uL (ref 4.0–10.5)

## 2014-08-19 LAB — RENAL FUNCTION PANEL
ALBUMIN: 2.3 g/dL — AB (ref 3.5–5.2)
Anion gap: 23 — ABNORMAL HIGH (ref 5–15)
BUN: 96 mg/dL — AB (ref 6–23)
CHLORIDE: 88 meq/L — AB (ref 96–112)
CO2: 22 mEq/L (ref 19–32)
CREATININE: 15.85 mg/dL — AB (ref 0.50–1.35)
Calcium: 9 mg/dL (ref 8.4–10.5)
GFR calc Af Amer: 3 mL/min — ABNORMAL LOW (ref >90)
GFR, EST NON AFRICAN AMERICAN: 2 mL/min — AB (ref >90)
Glucose, Bld: 111 mg/dL — ABNORMAL HIGH (ref 70–99)
PHOSPHORUS: 7.6 mg/dL — AB (ref 2.3–4.6)
Potassium: 4 mEq/L (ref 3.7–5.3)
Sodium: 133 mEq/L — ABNORMAL LOW (ref 137–147)

## 2014-08-19 LAB — PREPARE RBC (CROSSMATCH)

## 2014-08-19 LAB — ABO/RH: ABO/RH(D): O POS

## 2014-08-19 LAB — HEPATITIS B SURFACE ANTIGEN: Hepatitis B Surface Ag: NEGATIVE

## 2014-08-19 MED ORDER — PREDNISONE 20 MG PO TABS
40.0000 mg | ORAL_TABLET | Freq: Every day | ORAL | Status: AC
  Administered 2014-08-19 – 2014-08-23 (×5): 40 mg via ORAL
  Filled 2014-08-19 (×6): qty 2

## 2014-08-19 MED ORDER — DARBEPOETIN ALFA 150 MCG/0.3ML IJ SOSY
PREFILLED_SYRINGE | INTRAMUSCULAR | Status: AC
  Filled 2014-08-19: qty 0.3

## 2014-08-19 MED ORDER — IPRATROPIUM-ALBUTEROL 0.5-2.5 (3) MG/3ML IN SOLN: 3.0000 mL | Freq: Four times a day (QID) | RESPIRATORY_TRACT | Status: DC | PRN

## 2014-08-19 MED ORDER — SODIUM CHLORIDE 0.9 % IV SOLN: Freq: Once | INTRAVENOUS | Status: DC

## 2014-08-19 NOTE — Procedures (Signed)
I have seen and examined this patient and agree with the plan of care . Tolerating dialysis well today BP 140/62  No heparin. Transitioned to hemodialysis myopericarditis will continue to dialysis.  Joi Leyva W 08/19/2014, 9:17 AM

## 2014-08-19 NOTE — Progress Notes (Addendum)
Patient Name: Randy [name] Date of Encounter: 08/19/2014  Principal Problem:   Acute myopericarditis Active Problems:   End stage renal disease   Anemia of chronic disease   Elevated troponin   Primary Cardiologist: Dr. Domenic Polite  Patient Profile: 80 yo male w/ hx S-CHF, ESRD on peritoneal dialysis, was admitted 11/25 w/ SOB, elevated troponin. Cath w/ non-critical dz, EF 30-35%, felt NICM, suspected myopericarditis. On high-dose ASA.  SUBJECTIVE: No chest pain, +SOB and wheezing, says wheezing has been a problem recently. No chest pain  OBJECTIVE Filed Vitals:   08/19/14 0535 08/19/14 0654 08/19/14 0700 08/19/14 0730  BP: 120/66 147/72 144/79 120/60  Pulse: 100 100 90 98  Temp: 98.2 F (36.8 C) 98 F (36.7 C)    TempSrc: Oral Oral    Resp: 18 20    Height:      Weight:  163 lb 2.3 oz (74 kg)    SpO2: 94% 95%  100%    Intake/Output Summary (Last 24 hours) at 08/19/14 0757 Last data filed at 08/19/14 0539  Gross per 24 hour  Intake    760 ml  Output     25 ml  Net    735 ml   Filed Weights   08/18/14 2039 08/19/14 0500 08/19/14 0654  Weight: 160 lb 15 oz (73.001 kg) 165 lb (74.844 kg) 163 lb 2.3 oz (74 kg)    PHYSICAL EXAM General: Well developed, well nourished, male in moderate distress. Head: Normocephalic, atraumatic.  Neck: Supple without bruits, JVD to jaw. Lungs:  Resp regular and unlabored, rales and exp wheeze. Heart: RRR, S1, S2, no S3, S4, soft murmur; no rub. Abdomen: Soft, non-tender, non-distended, BS + x 4.  Extremities: No clubbing, cyanosis, no edema.  Neuro: Alert and oriented X 3. Moves all extremities spontaneously. Psych: Normal affect.  LABS: CBC: Recent Labs  08/17/14 0625  08/18/14 0326 08/19/14 0635  WBC 9.9  < > 9.2 8.3  NEUTROABS 8.1*  --   --   --   HGB 8.9*  < > 7.2* 6.8*  HCT 28.0*  < > 23.6* 21.5*  MCV 98.2  < > 96.7 93.9  PLT 308  < > 276 243  < > = values in this interval not displayed. INR:  Recent Labs   08/17/14 0625  INR 3.76   Basic Metabolic Panel:  Recent Labs  08/17/14 0625 08/17/14 1904 08/18/14 0326  NA 139  --  134*  K 3.3*  --  4.1  CL 93*  --  91*  CO2 27  --  23  GLUCOSE 141*  --  135*  BUN 70*  --  81*  CREATININE 14.45* 14.07* 14.69*  CALCIUM 9.9  --  9.1   Cardiac Enzymes:  Recent Labs  08/17/14 1500 08/17/14 2049 08/18/14 0326  TROPONINI 1.75* 2.56* 2.22*   BNP: PRO B NATRIURETIC PEPTIDE (BNP)  Date/Time Value Ref Range Status  08/17/2014 06:25 AM >70000.0* 0 - 450 pg/mL Final  06/21/2014 10:46 AM >70000.0* 0 - 450 pg/mL Final   Fasting Lipid Panel:  Recent Labs  08/18/14 0326  CHOL 125  HDL 72  LDLCALC 44  TRIG 43  CHOLHDL 1.7   Thyroid Function Tests:  Recent Labs  08/17/14 1500  TSH 0.861   TELE:  SR, seen in nuc med   Echo: 08/18/2014 Study Conclusions - Left ventricle: The cavity size was mildly dilated. Systolic function was moderately to severely reduced. The estimated ejection fraction was  in the range of 30% to 35%. Diffuse hypokinesis. Features are consistent with a pseudonormal left ventricular filling pattern, with concomitant abnormal relaxation and increased filling pressure (grade 2 diastolic dysfunction). - Aortic valve: Mild to moderate aortic stenosis (mild by mean gradient, moderate by calculated valve area - may be low gradient moderate AS). Trileaflet; moderately calcified leaflets. There was trivial regurgitation. Mean gradient (S): 13 mm Hg. Peak gradient (S): 30 mm Hg. Valve area (VTI): 1.37 cm^2. - Mitral valve: Mildly calcified annulus. There was mild regurgitation. - Left atrium: The atrium was moderately to severely dilated. - Right ventricle: The cavity size was normal. Systolic function was normal. - Right atrium: The atrium was mildly to moderately dilated. - Tricuspid valve: There was mild-moderate regurgitation. Peak RV-RA gradient (S): 75 mm Hg. - Pulmonary arteries:  PA peak pressure: 83 mm Hg (S). - Systemic veins: IVC 2.2 cm with > 50% respirophasic variation, suggesting RA pressure 8 mmHg. - Pericardium, extracardiac: There was a left pleural effusion. Small circumferential pericardial effusion . The pericardium did appear thickened. Impressions: - Mildly dilated LV with EF 30-35%. DIffuse hypokinesis. Moderate diastolic dysfunction. Normal RV size and systolic function. Mild to moderate aortic stenosis (mild by mean gradient, moderate by calculated valve area). Biatrial enlargement. Severe pulmonary hypertension.  Dg Chest Port 1 View 08/17/2014   CLINICAL DATA:  Chest pain.  EXAM: PORTABLE CHEST - 1 VIEW  COMPARISON:  06/21/2014  FINDINGS: Unchanged cardiomegaly. Mild aortic tortuosity which is also stable. Ongoing pulmonary edema with small to moderate left pleural effusion. No pneumothorax.  IMPRESSION: Unchanged pulmonary edema and left pleural effusion.   Electronically Signed   By: Jorje Guild M.D.   On: 08/17/2014 06:33    Current Medications:  . aspirin  650 mg Oral 3 times per day  . atorvastatin  80 mg Oral q1800  . calcitRIOL  0.5 mcg Oral Daily  . darbepoetin (ARANESP) injection - DIALYSIS  150 mcg Intravenous Q Fri-HD  . heparin  5,000 Units Subcutaneous 3 times per day  . multivitamin  1 tablet Oral QHS  . rOPINIRole  0.25-0.5 mg Oral QHS  . sevelamer carbonate  4,000 mg Oral TID WC  . sodium chloride  3 mL Intravenous Q12H      ASSESSMENT AND PLAN: Principal Problem:   Acute myopericarditis - continue high-dose ASA, MD advise duration.  Active Problems:   End stage renal disease - was on peritoneal HD PTA, Renal managing, getting HD now. They need to advise plan.    Anemia of chronic disease - for transfusion at HD today    Elevated troponin - felt 2nd myopericarditis  Plan - d/c when medically stable, breathing not good enough yet. MD review CXR, may need decubitus film. May benefit from nebs.    Signed, Rosaria Ferries , PA-C 7:57 AM 08/19/2014  The patient was seen, examined and discussed with Rosaria Ferries, PA-C and I agree with the above.   Continue high-dose aspirin, holding off colchicine with renal impairment and uncertain adequacy of dialysis. Since we are unable to use colchicine, I will add a short course of oral steroids - prednisone 40 mg po daily x 5 days, that will help acute myopericarditis and acute COPD exacerbation as well. Unable to perform MRI for pericarditis confirmation with GFR < 30. We will add nebulizer treatments for wheezing. Otherwise continue Norvasc, Lipitor, hydralazine, labetalol, and clonidine. Nephrology consultation requested. He is still fluid overloaded, depending on HD for diuresis. In HD today. We will  reevaluate tomorrow.  We will add nebulizer treatments for wheezing. Discussed with nephrology - no colchicine and HD for now, repeat tomorrow and on Monday.    Dorothy Spark 08/19/2014

## 2014-08-19 NOTE — Progress Notes (Signed)
CRITICAL VALUE ALERT  Critical value received:  Hgb 6.8 Date of notification:  08/19/14  Time of notification:  0800  Critical value read back:yes  Nurse who received alert:  Louretta Shorten RN  MD notified (1st page):  Justin Mend  Time of first page:  0800  MD notified (2nd page):  Time of second page:  Responding MD:  Justin Mend  Time MD responded:  0800   Will type and screen and transfuse 1unit PRBCs on HD today per Dr. Justin Mend

## 2014-08-20 DIAGNOSIS — N186 End stage renal disease: Secondary | ICD-10-CM

## 2014-08-20 LAB — TYPE AND SCREEN
ABO/RH(D): O POS
Antibody Screen: NEGATIVE
Unit division: 0

## 2014-08-20 MED ORDER — LIDOCAINE-PRILOCAINE 2.5-2.5 % EX CREA
1.0000 "application " | TOPICAL_CREAM | CUTANEOUS | Status: DC | PRN
  Filled 2014-08-20: qty 5

## 2014-08-20 MED ORDER — LIDOCAINE HCL (PF) 1 % IJ SOLN: 5.0000 mL | INTRAMUSCULAR | Status: DC | PRN

## 2014-08-20 MED ORDER — PENTAFLUOROPROP-TETRAFLUOROETH EX AERO: 1.0000 "application " | INHALATION_SPRAY | CUTANEOUS | Status: DC | PRN

## 2014-08-20 MED ORDER — LABETALOL HCL 200 MG PO TABS
400.0000 mg | ORAL_TABLET | Freq: Two times a day (BID) | ORAL | Status: DC
  Administered 2014-08-20 – 2014-08-23 (×6): 400 mg via ORAL
  Filled 2014-08-20 (×8): qty 2

## 2014-08-20 MED ORDER — SODIUM CHLORIDE 0.9 % IV SOLN: 100.0000 mL | INTRAVENOUS | Status: DC | PRN

## 2014-08-20 MED ORDER — POLYETHYLENE GLYCOL 3350 17 G PO PACK
17.0000 g | PACK | Freq: Every day | ORAL | Status: DC
  Administered 2014-08-20 – 2014-08-23 (×2): 17 g via ORAL
  Filled 2014-08-20 (×4): qty 1

## 2014-08-20 MED ORDER — ZOLPIDEM TARTRATE 5 MG PO TABS
5.0000 mg | ORAL_TABLET | Freq: Every evening | ORAL | Status: DC | PRN
  Administered 2014-08-20: 5 mg via ORAL
  Filled 2014-08-20: qty 1

## 2014-08-20 MED ORDER — CLONIDINE HCL ER 0.1 MG PO TB12
0.1000 mg | ORAL_TABLET | Freq: Two times a day (BID) | ORAL | Status: DC
  Administered 2014-08-20 – 2014-08-21 (×3): 0.1 mg via ORAL
  Filled 2014-08-20 (×7): qty 1

## 2014-08-20 MED ORDER — DOCUSATE SODIUM 100 MG PO CAPS
100.0000 mg | ORAL_CAPSULE | Freq: Two times a day (BID) | ORAL | Status: DC
  Administered 2014-08-20 – 2014-08-23 (×6): 100 mg via ORAL
  Filled 2014-08-20 (×9): qty 1

## 2014-08-20 MED ORDER — ALTEPLASE 2 MG IJ SOLR: 2.0000 mg | Freq: Once | INTRAMUSCULAR | Status: DC | PRN

## 2014-08-20 MED ORDER — HEPARIN SODIUM (PORCINE) 1000 UNIT/ML DIALYSIS: 1000.0000 [IU] | INTRAMUSCULAR | Status: DC | PRN

## 2014-08-20 MED ORDER — NEPRO/CARBSTEADY PO LIQD
237.0000 mL | ORAL | Status: DC | PRN
  Filled 2014-08-20: qty 237

## 2014-08-20 MED ORDER — ALTEPLASE 2 MG IJ SOLR
2.0000 mg | Freq: Once | INTRAMUSCULAR | Status: DC | PRN
  Filled 2014-08-20: qty 2

## 2014-08-20 MED ORDER — AMLODIPINE BESYLATE 10 MG PO TABS
10.0000 mg | ORAL_TABLET | Freq: Every day | ORAL | Status: DC
  Administered 2014-08-20 – 2014-08-23 (×4): 10 mg via ORAL
  Filled 2014-08-20 (×5): qty 1

## 2014-08-20 MED ORDER — HYDRALAZINE HCL 25 MG PO TABS
25.0000 mg | ORAL_TABLET | Freq: Three times a day (TID) | ORAL | Status: DC
  Administered 2014-08-20 – 2014-08-23 (×8): 25 mg via ORAL
  Filled 2014-08-20 (×12): qty 1

## 2014-08-20 MED ORDER — PENTAFLUOROPROP-TETRAFLUOROETH EX AERO
INHALATION_SPRAY | CUTANEOUS | Status: AC
  Filled 2014-08-20: qty 103.5

## 2014-08-20 NOTE — Progress Notes (Signed)
Reviewed MI booklet, heart healthy diet and exercise instructions with patient. Randy [name] says he can not walk long distances but will walk as he can tolerate. Randy [name] is not interested in outpatient cardiac rehab at this time due to uncertainty about future dialysis schedule.

## 2014-08-20 NOTE — Progress Notes (Signed)
Primary cardiologist: Dr. Satira Sark  Seen for followup: Myopericarditis  Subjective:    Patient in HD today, states he feels better overall. No chest pain or palpitations.  Objective:   Temp:  [97.6 F (36.4 C)-98.9 F (37.2 C)] 97.6 F (36.4 C) (11/28 0845) Pulse Rate:  [90-105] 105 (11/28 0850) Resp:  [18-23] 22 (11/28 0850) BP: (136-188)/(62-99) 171/91 mmHg (11/28 0850) SpO2:  [98 %-100 %] 99 % (11/28 0845) Weight:  [161 lb 6 oz (73.199 kg)-166 lb 9.6 oz (75.569 kg)] 163 lb 12.8 oz (74.3 kg) (11/28 0845) Last BM Date: 08/16/14  Filed Weights   08/19/14 2028 08/20/14 0500 08/20/14 0845  Weight: 161 lb 6 oz (73.199 kg) 166 lb 9.6 oz (75.569 kg) 163 lb 12.8 oz (74.3 kg)    Intake/Output Summary (Last 24 hours) at 08/20/14 0910 Last data filed at 08/20/14 0446  Gross per 24 hour  Intake   1230 ml  Output    712 ml  Net    518 ml    Telemetry: Sinus rhythm. NSVT noted.  Exam:  General: No distress.  Lungs: Clear, nonlabored. Decreased breath sounds.  Cardiac: RRR, single component friction rub anteriorly.  Abdomen: NABS.  Extremities: No pitting.   Lab Results:  Basic Metabolic Panel:  Recent Labs Lab 08/17/14 0625 08/17/14 1904 08/18/14 0326 08/19/14 0635  NA 139  --  134* 133*  K 3.3*  --  4.1 4.0  CL 93*  --  91* 88*  CO2 27  --  23 22  GLUCOSE 141*  --  135* 111*  BUN 70*  --  81* 96*  CREATININE 14.45* 14.07* 14.69* 15.85*  CALCIUM 9.9  --  9.1 9.0    Liver Function Tests:  Recent Labs Lab 08/19/14 0635  ALBUMIN 2.3*    CBC:  Recent Labs Lab 08/17/14 1904 08/18/14 0326 08/19/14 0635  WBC 10.4 9.2 8.3  HGB 7.0* 7.2* 6.8*  HCT 22.1* 23.6* 21.5*  MCV 97.8 96.7 93.9  PLT 277 276 243    Cardiac Enzymes:  Recent Labs Lab 08/17/14 1500 08/17/14 2049 08/18/14 0326  TROPONINI 1.75* 2.56* 2.22*    ECG: Sinus rhythm with LVH and repolarization abnormalities, PVCs.   Medications:   Scheduled Medications: .  sodium chloride   Intravenous Once  . aspirin  650 mg Oral 3 times per day  . atorvastatin  80 mg Oral q1800  . calcitRIOL  0.5 mcg Oral Daily  . darbepoetin (ARANESP) injection - DIALYSIS  150 mcg Intravenous Q Fri-HD  . heparin  5,000 Units Subcutaneous 3 times per day  . multivitamin  1 tablet Oral QHS  . pentafluoroprop-tetrafluoroeth      . predniSONE  40 mg Oral Q breakfast  . rOPINIRole  0.25-0.5 mg Oral QHS  . sevelamer carbonate  4,000 mg Oral TID WC  . sodium chloride  3 mL Intravenous Q12H      PRN Medications:  sodium chloride, sodium chloride, sodium chloride, sodium chloride, sodium chloride, acetaminophen, alteplase, feeding supplement (NEPRO CARB STEADY), guaiFENesin-dextromethorphan, heparin, ipratropium-albuterol, lidocaine (PF), lidocaine-prilocaine, nitroGLYCERIN, ondansetron (ZOFRAN) IV, pentafluoroprop-tetrafluoroeth, sevelamer carbonate, sodium chloride, zolpidem   Assessment:    1. Suspected myopericarditis with abnormal troponin I level. Cardiac catheterization on 11/25 demonstrated CAD that was mild to moderate in the major epicardial with an area of 80% stenosis in a ramus branch that was not felt to be a culprit lesion. He is on high-dose aspirin at this time. Prednisone added by Dr. Meda Coffee.  2. Overall moderate CAD, to be managed medically at this time.  3. LVEF 30-35% with diffuse hypokinesis, likely nonischemic based on recent cardiac catheterization findings. Would not pursue evaluation for device options at this time.  4. End-stage renal disease, recently on peritoneal dialysis, followed by Dr. Lowanda Foster. Nephrology now has him on HD sessions.  5. Anemia, now on Aranesp.  5. History of medication noncompliance based on chart review.   Plan/Discussion:    Reviewed chart - patient not on anithypertensives since transfer, will resume. Anemia likely of chronic disease, will heme check stools however since on high dose ASA.  See how he does with HD  session. Possibly home 24-48 hours.   Satira Sark, M.D., F.A.C.C.

## 2014-08-20 NOTE — Plan of Care (Signed)
Problem: Phase III Progression Outcomes Goal: Tolerating diet Outcome: Completed/Met Date Met:  08/20/14

## 2014-08-20 NOTE — Progress Notes (Signed)
Patient had run of 7 PVCs in early morning.  Asymptomatic.  Notified cardiac on-call MD.  Will continue to monitor.

## 2014-08-21 LAB — CBC
HEMATOCRIT: 26.7 % — AB (ref 39.0–52.0)
HEMOGLOBIN: 8.1 g/dL — AB (ref 13.0–17.0)
MCH: 28.5 pg (ref 26.0–34.0)
MCHC: 30.3 g/dL (ref 30.0–36.0)
MCV: 94 fL (ref 78.0–100.0)
Platelets: 297 10*3/uL (ref 150–400)
RBC: 2.84 MIL/uL — ABNORMAL LOW (ref 4.22–5.81)
RDW: 18.1 % — ABNORMAL HIGH (ref 11.5–15.5)
WBC: 9 10*3/uL (ref 4.0–10.5)

## 2014-08-21 LAB — GLUCOSE, CAPILLARY
GLUCOSE-CAPILLARY: 104 mg/dL — AB (ref 70–99)
Glucose-Capillary: 160 mg/dL — ABNORMAL HIGH (ref 70–99)

## 2014-08-21 LAB — BASIC METABOLIC PANEL
ANION GAP: 20 — AB (ref 5–15)
BUN: 43 mg/dL — ABNORMAL HIGH (ref 6–23)
CO2: 24 meq/L (ref 19–32)
Calcium: 9.2 mg/dL (ref 8.4–10.5)
Chloride: 92 mEq/L — ABNORMAL LOW (ref 96–112)
Creatinine, Ser: 6.8 mg/dL — ABNORMAL HIGH (ref 0.50–1.35)
GFR calc Af Amer: 8 mL/min — ABNORMAL LOW (ref >90)
GFR calc non Af Amer: 7 mL/min — ABNORMAL LOW (ref >90)
Glucose, Bld: 106 mg/dL — ABNORMAL HIGH (ref 70–99)
Potassium: 3.9 mEq/L (ref 3.7–5.3)
SODIUM: 136 meq/L — AB (ref 137–147)

## 2014-08-21 MED ORDER — LACTULOSE 10 GM/15ML PO SOLN
20.0000 g | Freq: Once | ORAL | Status: AC
  Administered 2014-08-21: 20 g via ORAL
  Filled 2014-08-21: qty 30

## 2014-08-21 MED ORDER — SODIUM CHLORIDE 0.9 % IV SOLN
125.0000 mg | INTRAVENOUS | Status: DC
  Administered 2014-08-22: 125 mg via INTRAVENOUS
  Filled 2014-08-21 (×3): qty 10

## 2014-08-21 NOTE — Plan of Care (Signed)
Problem: Phase II Progression Outcomes Goal: Cardiac Rehab referral Outcome: Completed/Met Date Met:  08/21/14  Problem: Phase III Progression Outcomes Goal: Up to chair & ambulate with assist (TID) Outcome: Completed/Met Date Met:  08/21/14

## 2014-08-21 NOTE — Progress Notes (Signed)
[facility] ROUNDING NOTE   Subjective:   Interval History: no complaints today  Objective:  Vital signs in last 24 hours:  Temp:  [98 F (36.7 C)-99 F (37.2 C)] 98.2 F (36.8 C) (11/29 0455) Pulse Rate:  [77-117] 77 (11/29 0455) Resp:  [15-28] 18 (11/29 0455) BP: (126-194)/(66-138) 139/70 mmHg (11/29 0455) SpO2:  [92 %-99 %] 99 % (11/29 0455) Weight:  [73.1 kg (161 lb 2.5 oz)-74.6 kg (164 lb 7.4 oz)] 74.6 kg (164 lb 7.4 oz) (11/28 1959)  Weight change: 1.1 kg (2 lb 6.8 oz) Filed Weights   08/20/14 0845 08/20/14 1215 08/20/14 1959  Weight: 74.3 kg (163 lb 12.8 oz) 73.1 kg (161 lb 2.5 oz) 74.6 kg (164 lb 7.4 oz)    Intake/Output: I/O last 3 completed shifts: In: 720 [P.O.:720] Out: 2000 [Other:2000]   Intake/Output this shift:  Total I/O In: 59 [P.O.:460] Out: 0   CVS- RRR  1/6 systolic murmur RS- CTA ABD- BS present soft non-distended EXT- Left AVF   Basic Metabolic Panel:  Recent Labs Lab 08/17/14 0625 08/17/14 1904 08/18/14 0326 08/19/14 0635 08/21/14 0347  NA 139  --  134* 133* 136*  K 3.3*  --  4.1 4.0 3.9  CL 93*  --  91* 88* 92*  CO2 27  --  23 22 24   GLUCOSE 141*  --  135* 111* 106*  BUN 70*  --  81* 96* 43*  CREATININE 14.45* 14.07* 14.69* 15.85* 6.80*  CALCIUM 9.9  --  9.1 9.0 9.2  PHOS  --   --   --  7.6*  --     Liver Function Tests:  Recent Labs Lab 08/19/14 0635  ALBUMIN 2.3*   No results for input(s): LIPASE, AMYLASE in the last 168 hours. No results for input(s): AMMONIA in the last 168 hours.  CBC:  Recent Labs Lab 08/17/14 0625 08/17/14 1904 08/18/14 0326 08/19/14 0635 08/21/14 0347  WBC 9.9 10.4 9.2 8.3 9.0  NEUTROABS 8.1*  --   --   --   --   HGB 8.9* 7.0* 7.2* 6.8* 8.1*  HCT 28.0* 22.1* 23.6* 21.5* 26.7*  MCV 98.2 97.8 96.7 93.9 94.0  PLT 308 277 276 243 297    Cardiac Enzymes:  Recent Labs Lab 08/17/14 0625 08/17/14 1500 08/17/14 2049 08/18/14 0326  TROPONINI 3.52* 1.75* 2.56* 2.22*     BNP: Invalid input(s): POCBNP  CBG: No results for input(s): GLUCAP in the last 168 hours.  Microbiology: Results for orders placed or performed during the hospital encounter of 06/21/14  MRSA PCR Screening     Status: None   Collection Time: 06/21/14  5:50 PM  Result Value Ref Range Status   MRSA by PCR NEGATIVE NEGATIVE Final    Comment:        The GeneXpert MRSA Assay (FDA approved for NASAL specimens only), is one component of a comprehensive MRSA colonization surveillance program. It is not intended to diagnose MRSA infection nor to guide or monitor treatment for MRSA infections.    Coagulation Studies: No results for input(s): LABPROT, INR in the last 72 hours.  Urinalysis: No results for input(s): COLORURINE, LABSPEC, PHURINE, GLUCOSEU, HGBUR, BILIRUBINUR, KETONESUR, PROTEINUR, UROBILINOGEN, NITRITE, LEUKOCYTESUR in the last 72 hours.  Invalid input(s): APPERANCEUR    Imaging: No results found.   Medications:     . sodium chloride   Intravenous Once  . amLODipine  10 mg Oral Daily  . aspirin  650 mg Oral 3 times per  day  . atorvastatin  80 mg Oral q1800  . calcitRIOL  0.5 mcg Oral Daily  . cloNIDine HCl  0.1 mg Oral BID  . darbepoetin (ARANESP) injection - DIALYSIS  150 mcg Intravenous Q Fri-HD  . docusate sodium  100 mg Oral BID  . heparin  5,000 Units Subcutaneous 3 times per day  . hydrALAZINE  25 mg Oral 3 times per day  . labetalol  400 mg Oral BID  . multivitamin  1 tablet Oral QHS  . polyethylene glycol  17 g Oral Daily  . predniSONE  40 mg Oral Q breakfast  . rOPINIRole  0.25-0.5 mg Oral QHS  . sevelamer carbonate  4,000 mg Oral TID WC  . sodium chloride  3 mL Intravenous Q12H   sodium chloride, acetaminophen, guaiFENesin-dextromethorphan, ipratropium-albuterol, nitroGLYCERIN, ondansetron (ZOFRAN) IV, sevelamer carbonate, sodium chloride, zolpidem  Assessment/ Plan:   ESRD- with suspected myopericarditis due to inadequate dialysis and  will proceed with intermittent dialysis  ANEMIA- Will give ESA and  T sats 14 % will replace with IV iron  MBD-   Phosphorus 7.6  Calcium 9.0 (corrected calcium 10.5 )  Will add renvela  HTN/VOL  Antihypertensives adjusted per primary  Labetalol 400 BID  Hydralazine 25mg  TID and Norvasc 10mg  daily  ACCESS- Transitioned from PD to hemodialysis for now    LOS: 4 Neaveh Belanger W @TODAY @9 :16 AM

## 2014-08-21 NOTE — Progress Notes (Signed)
Primary cardiologist: Dr. Satira Sark  Subjective: No complaints except mild constipation.   Objective: Vital signs in last 24 hours: Temp:  [97.6 F (36.4 C)-99 F (37.2 C)] 98.2 F (36.8 C) (11/29 0455) Pulse Rate:  [77-117] 77 (11/29 0455) Resp:  [15-28] 18 (11/29 0455) BP: (126-194)/(66-138) 139/70 mmHg (11/29 0455) SpO2:  [92 %-99 %] 99 % (11/29 0455) Weight:  [161 lb 2.5 oz (73.1 kg)-164 lb 7.4 oz (74.6 kg)] 164 lb 7.4 oz (74.6 kg) (11/28 1959) Weight change: 2 lb 6.8 oz (1.1 kg) Last BM Date: 08/16/14 Intake/Output from previous day: wt 164.7 down from pk of 166 11/28 0701 - 11/29 0700 In: 480 [P.O.:480] Out: 2000   PE: General:Pleasant affect, NAD Skin:Warm and dry, brisk capillary refill HEENT:normocephalic, sclera clear, mucus membranes moist Heart:S1S2 RRR with soft 1/6 systolic murmur, no gallup, rub or click Lungs:clear without rales, rhonchi, or wheezes BJS:EGBT, non tender, + BS, do not palpate liver spleen or masses Ext:no lower ext edema,  2+ radial pulses Neuro:alert and oriented X 3, MAE, follows commands, + facial symmetry  Tele: SR with PVCs occ short bursts of NSVT, HR up to 120s at lunch yesterday- may have been straining.   Lab Results:  Recent Labs  08/19/14 0635 08/21/14 0347  WBC 8.3 9.0  HGB 6.8* 8.1*  HCT 21.5* 26.7*  PLT 243 297   BMET  Recent Labs  08/19/14 0635 08/21/14 0347  NA 133* 136*  K 4.0 3.9  CL 88* 92*  CO2 22 24  GLUCOSE 111* 106*  BUN 96* 43*  CREATININE 15.85* 6.80*  CALCIUM 9.0 9.2    Lab Results  Component Value Date   CHOL 125 08/18/2014   HDL 72 08/18/2014   LDLCALC 44 08/18/2014   TRIG 43 08/18/2014   CHOLHDL 1.7 08/18/2014    Lab Results  Component Value Date   TSH 0.861 08/17/2014    Medications: I have reviewed the patient's current medications. Scheduled Meds: . sodium chloride   Intravenous Once  . amLODipine  10 mg Oral Daily  . aspirin  650 mg Oral 3 times per day  .  atorvastatin  80 mg Oral q1800  . calcitRIOL  0.5 mcg Oral Daily  . cloNIDine HCl  0.1 mg Oral BID  . darbepoetin (ARANESP) injection - DIALYSIS  150 mcg Intravenous Q Fri-HD  . docusate sodium  100 mg Oral BID  . heparin  5,000 Units Subcutaneous 3 times per day  . hydrALAZINE  25 mg Oral 3 times per day  . labetalol  400 mg Oral BID  . multivitamin  1 tablet Oral QHS  . polyethylene glycol  17 g Oral Daily  . predniSONE  40 mg Oral Q breakfast  . rOPINIRole  0.25-0.5 mg Oral QHS  . sevelamer carbonate  4,000 mg Oral TID WC  . sodium chloride  3 mL Intravenous Q12H   PRN Meds:.sodium chloride, acetaminophen, guaiFENesin-dextromethorphan, ipratropium-albuterol, nitroGLYCERIN, ondansetron (ZOFRAN) IV, sevelamer carbonate, sodium chloride, zolpidem  Assessment/Plan: 1. Suspected myopericarditis with abnormal troponin I level pk of 2.56. Cardiac catheterization on 11/25 demonstrated CAD that was mild to moderate in the major epicardial with an area of 80% stenosis in a ramus branch that was not felt to be a culprit lesion. Most likely due to inadequate dialysis. He is on high-dose aspirin at this time. Prednisone added by Dr. Meda Coffee. Last dose to be 08/24/14.  2. Overall moderate CAD, to be managed medically at this time.  3. LVEF  30-35% with diffuse hypokinesis, likely nonischemic based on recent cardiac catheterization findings. Would not pursue evaluation for device options at this time.  4. End-stage renal disease, recently on peritoneal dialysis, followed by Dr. Lowanda Foster. Nephrology now has him on HD sessions. - if d/c today will need to see Dr. Lowanda Foster Monday- not sure of dialysis plans  I did talk with Dr. Justin Mend and he will need to stay today so that we can arrange outpt dialysis.  Plan discharge for tomorrow.  5. Anemia, now on Aranesp. H/H 8.1/26.7 today up from hgb 6.8  6. History of medication noncompliance based on chart review.  7. Constipation- add lactulose- rec'd metamucil  yesterday without results.     LOS: 4 days   Time spent with pt. :15 minutes. [facility] R  Nurse Practitioner Certified Pager [phone] or after 5pm and on weekends call [phone] 08/21/2014, 7:16 AM   Attending note:  Patient seen and examined. Agree with above assessment by Ms. [employer]. No complaints of chest pain or dyspnea today. Lungs are clear and heart exam with RRR and soft single component friction rub anteriorly. Tolerated HD yesterday and plan is to continue HD as outpatient - needs to be arranged with Dr. Lowanda Foster. Anemia stable. Continue high dose ASA for 10 day course, predinisone is limited as well and ending on 12/2. No change to other cardiac medications. Likely D/C tomorrow once Nephrology is able to arrange HD with Dr. Lowanda Foster.  Satira Sark, M.D., F.A.C.C.

## 2014-08-21 NOTE — Plan of Care (Signed)
Problem: Phase III Progression Outcomes Goal: Anginal pain absent Outcome: Completed/Met Date Met:  08/21/14 Goal: Vascular site scale level 0 - I Vascular Site Scale Level 0: No bruising/bleeding/hematoma Level I (Mild): Bruising/Ecchymosis, minimal bleeding/ooozing, palpable hematoma < 3 cm Level II (Moderate): Bleeding not affecting hemodynamic parameters, pseudoaneurysm, palpable hematoma > 3 cm Level III (Severe) Bleeding which affects hemodynamic parameters or retroperitoneal hemorrhage  Outcome: Completed/Met Date Met:  08/21/14

## 2014-08-22 LAB — CBC
HCT: 25.6 % — ABNORMAL LOW (ref 39.0–52.0)
Hemoglobin: 7.9 g/dL — ABNORMAL LOW (ref 13.0–17.0)
MCH: 28.7 pg (ref 26.0–34.0)
MCHC: 30.9 g/dL (ref 30.0–36.0)
MCV: 93.1 fL (ref 78.0–100.0)
Platelets: 321 10*3/uL (ref 150–400)
RBC: 2.75 MIL/uL — AB (ref 4.22–5.81)
RDW: 17.8 % — ABNORMAL HIGH (ref 11.5–15.5)
WBC: 7.8 10*3/uL (ref 4.0–10.5)

## 2014-08-22 LAB — RENAL FUNCTION PANEL
ANION GAP: 22 — AB (ref 5–15)
Albumin: 2.2 g/dL — ABNORMAL LOW (ref 3.5–5.2)
BUN: 66 mg/dL — ABNORMAL HIGH (ref 6–23)
CALCIUM: 8.8 mg/dL (ref 8.4–10.5)
CHLORIDE: 88 meq/L — AB (ref 96–112)
CO2: 21 mEq/L (ref 19–32)
Creatinine, Ser: 9.06 mg/dL — ABNORMAL HIGH (ref 0.50–1.35)
GFR calc non Af Amer: 5 mL/min — ABNORMAL LOW (ref >90)
GFR, EST AFRICAN AMERICAN: 6 mL/min — AB (ref >90)
GLUCOSE: 143 mg/dL — AB (ref 70–99)
POTASSIUM: 4 meq/L (ref 3.7–5.3)
Phosphorus: 4.9 mg/dL — ABNORMAL HIGH (ref 2.3–4.6)
Sodium: 131 mEq/L — ABNORMAL LOW (ref 137–147)

## 2014-08-22 MED ORDER — LIDOCAINE HCL (PF) 1 % IJ SOLN: 5.0000 mL | INTRAMUSCULAR | Status: DC | PRN

## 2014-08-22 MED ORDER — HEPARIN SODIUM (PORCINE) 1000 UNIT/ML DIALYSIS: 1000.0000 [IU] | INTRAMUSCULAR | Status: DC | PRN

## 2014-08-22 MED ORDER — SODIUM CHLORIDE 0.9 % IV SOLN: 100.0000 mL | INTRAVENOUS | Status: DC | PRN

## 2014-08-22 MED ORDER — PENTAFLUOROPROP-TETRAFLUOROETH EX AERO: 1.0000 "application " | INHALATION_SPRAY | CUTANEOUS | Status: DC | PRN

## 2014-08-22 MED ORDER — ALTEPLASE 2 MG IJ SOLR
2.0000 mg | Freq: Once | INTRAMUSCULAR | Status: DC | PRN
  Filled 2014-08-22: qty 2

## 2014-08-22 MED ORDER — NEPRO/CARBSTEADY PO LIQD
237.0000 mL | ORAL | Status: DC | PRN
  Filled 2014-08-22: qty 237

## 2014-08-22 MED ORDER — LIDOCAINE-PRILOCAINE 2.5-2.5 % EX CREA: 1.0000 "application " | TOPICAL_CREAM | CUTANEOUS | Status: DC | PRN

## 2014-08-22 MED ORDER — CLONIDINE HCL 0.1 MG PO TABS
0.1000 mg | ORAL_TABLET | Freq: Two times a day (BID) | ORAL | Status: DC
  Administered 2014-08-22 – 2014-08-23 (×2): 0.1 mg via ORAL
  Filled 2014-08-22 (×3): qty 1

## 2014-08-22 MED ORDER — PANTOPRAZOLE SODIUM 40 MG PO TBEC
40.0000 mg | DELAYED_RELEASE_TABLET | Freq: Every day | ORAL | Status: DC
  Administered 2014-08-22 – 2014-08-23 (×2): 40 mg via ORAL
  Filled 2014-08-22 (×2): qty 1

## 2014-08-22 NOTE — Progress Notes (Signed)
   Patient is stable from a cardiac standpoint, however is awaiting establishment of outpatient HD. I was notified that he is still awaiting a spot and that this may not be arranged by the end of the day. We will keep inpatient until outpatient HD is arranged. Discharge summary has been started and is pended. Cardiology f/u has already been arranged in [place] for 08/31/14 (may need to be rescheduled pending HD schedule). Will discharge once cleared by nephrology.   Lyda Jester, PA-C 08/22/2014

## 2014-08-22 NOTE — Procedures (Signed)
Pt seen on HD. Ap 190 Vp 150. BFR 400.  Tolerating HD well so far.  Home when [place] with cardiology and we have an outpt spot.

## 2014-08-22 NOTE — Progress Notes (Signed)
Patient Profile: 38 with history of ESRD and cardiomyopathy of uncertain etiology (last EF 45-50%) intially presented to [facility] with chest pain. Troponin was elevated at 3.52. S/P LHC: most significant disease appeared to be 80% proximal stenoses in the moderate superior and inferior divisions of the ramus. These lesions did not look like culprits for ACS (plaque rupture).It was felt that the main process was likely myopericarditis in the setting of renal disease and ?inadequate dialysis (does PD at home). Treated with high dose ASA and prednisone.   Subjective: Only complaint is an upset stomach. No melena. Denies pleuritic chest pain and no dyspnea.   Objective: Vital signs in last 24 hours: Temp:  [97.7 F (36.5 C)-98.4 F (36.9 C)] 98.4 F (36.9 C) (11/30 0450) Pulse Rate:  [71-78] 71 (11/30 0450) Resp:  [18] 18 (11/30 0450) BP: (112-123)/(60-64) 123/64 mmHg (11/30 0450) SpO2:  [98 %-100 %] 100 % (11/30 0450) Weight:  [164 lb 0.4 oz (74.4 kg)] 164 lb 0.4 oz (74.4 kg) (11/29 1943) Last BM Date: 08/20/14  Intake/Output from previous day: 11/29 0701 - 11/30 0700 In: 580 [P.O.:580] Out: 0  Intake/Output this shift:    Medications Current Facility-Administered Medications  Medication Dose Route Frequency Provider Last Rate Last Dose  . 0.9 %  sodium chloride infusion  250 mL Intravenous PRN Larey Dresser, MD      . 0.9 %  sodium chloride infusion   Intravenous Once Sherril Croon, MD      . acetaminophen (TYLENOL) tablet 650 mg  650 mg Oral Q4H PRN Larey Dresser, MD   650 mg at 08/18/14 0226  . amLODipine (NORVASC) tablet 10 mg  10 mg Oral Daily Satira Sark, MD   10 mg at 08/21/14 1053  . aspirin tablet 650 mg  650 mg Oral 3 times per day Larey Dresser, MD   650 mg at 08/22/14 0525  . atorvastatin (LIPITOR) tablet 80 mg  80 mg Oral q1800 Larey Dresser, MD   80 mg at 08/21/14 1758  . calcitRIOL (ROCALTROL) capsule 0.5 mcg  0.5 mcg Oral Daily Larey Dresser, MD   0.5  mcg at 08/21/14 1053  . cloNIDine HCl (KAPVAY) ER tablet 0.1 mg  0.1 mg Oral BID Satira Sark, MD   0.1 mg at 08/21/14 2221  . Darbepoetin Alfa (ARANESP) injection 150 mcg  150 mcg Intravenous Q Fri-HD Sherril Croon, MD   150 mcg at 08/19/14 0740  . docusate sodium (COLACE) capsule 100 mg  100 mg Oral BID Rogelia Mire, NP   100 mg at 08/21/14 2220  . ferric gluconate (NULECIT) 125 mg in sodium chloride 0.9 % 100 mL IVPB  125 mg Intravenous Q M,W,F-HD Sherril Croon, MD      . guaiFENesin-dextromethorphan [facility] DM) 100-10 MG/5ML syrup 15 mL  15 mL Oral Q4H PRN Larey Dresser, MD   15 mL at 08/18/14 0223  . heparin injection 5,000 Units  5,000 Units Subcutaneous 3 times per day Larey Dresser, MD   5,000 Units at 08/22/14 0034  . hydrALAZINE (APRESOLINE) tablet 25 mg  25 mg Oral 3 times per day Satira Sark, MD   25 mg at 08/21/14 2220  . ipratropium-albuterol (DUONEB) 0.5-2.5 (3) MG/3ML nebulizer solution 3 mL  3 mL Nebulization Q6H PRN Dorothy Spark, MD      . labetalol (NORMODYNE) tablet 400 mg  400 mg Oral BID Satira Sark, MD   400 mg  at 08/21/14 2220  . multivitamin (RENA-VIT) tablet 1 tablet  1 tablet Oral QHS Larey Dresser, MD   1 tablet at 08/21/14 2220  . nitroGLYCERIN (NITROSTAT) SL tablet 0.4 mg  0.4 mg Sublingual Q5 min PRN Karen Chafe Molpus, MD   0.4 mg at 08/17/14 0636  . ondansetron (ZOFRAN) injection 4 mg  4 mg Intravenous Q6H PRN Larey Dresser, MD   4 mg at 08/22/14 4235  . polyethylene glycol (MIRALAX / GLYCOLAX) packet 17 g  17 g Oral Daily Rogelia Mire, NP   17 g at 08/20/14 1519  . predniSONE (DELTASONE) tablet 40 mg  40 mg Oral Q breakfast Dorothy Spark, MD   40 mg at 08/22/14 3614  . rOPINIRole (REQUIP) tablet 0.25-0.5 mg  0.25-0.5 mg Oral QHS Larey Dresser, MD   0.25 mg at 08/21/14 2220  . sevelamer carbonate (RENVELA) tablet 2,400 mg  2,400 mg Oral PRN Larey Dresser, MD   2,400 mg at 08/17/14 2347  . sevelamer carbonate  (RENVELA) tablet 4,000 mg  4,000 mg Oral TID WC Larey Dresser, MD   4,000 mg at 08/21/14 1758  . sodium chloride 0.9 % injection 3 mL  3 mL Intravenous Q12H Larey Dresser, MD   3 mL at 08/21/14 2222  . sodium chloride 0.9 % injection 3 mL  3 mL Intravenous PRN Larey Dresser, MD      . zolpidem [facility]) tablet 5 mg  5 mg Oral QHS PRN Orson Gear, MD   5 mg at 08/20/14 0126    PE: General appearance: alert, cooperative and no distress Neck: no JVD Lungs: clear to auscultation bilaterally Heart: regular rate and rhythm and 2/6 SM along LSB and apex Extremities: no LEE Pulses: 2+ and symmetric Skin: warm and dry Neurologic: Grossly normal  Lab Results:   Recent Labs  08/21/14 0347  WBC 9.0  HGB 8.1*  HCT 26.7*  PLT 297   BMET  Recent Labs  08/21/14 0347  NA 136*  K 3.9  CL 92*  CO2 24  GLUCOSE 106*  BUN 43*  CREATININE 6.80*  CALCIUM 9.2     Assessment/Plan    Principal Problem:   Acute myopericarditis Active Problems:   End stage renal disease, has been peritoneal, now hemodialysis   Anemia of chronic disease   Elevated troponin   1. Suspected Myopericarditis: no further chest pain. On high dose ASA 650 mg QH8. Also on Prednisone (scheduled to end 12/2).   WIll need to be followed closely as outpatient    2. CAD: moderate CAD on cath. No anginal symptoms. Continue medical therapy: ASA, statin and BB.   3. Cardiomyopathy: LVEF 30-35% with diffuse hypokinesis, likely nonischemic based on recent cardiac catheterization findings. Plan is to not pursue evaluation for device options at this time. Evoluemic on physical exam. Continue volume control through HD.   4. End-stage renal disease: recently on peritoneal dialysis, followed by Dr. Lowanda Foster. Plan going forward is for HD. Nephrology to arrange OP schedule.   5. Anemia: managed by nephrology. Now on Aranesp. H/H 8.1/26.7, improved from hgb 6.8  6. Abdominal pain: improved since this am. Denies melena.  Will prescribe a PPI for GI protection given high dose ASA therapy.   I have added protonix.       LOS: 5 days    Brittainy M. Ladoris Gene 08/22/2014 10:10 AM Patient seen and examined  Patient currently in HD  Asymptomatic.  I have amended  note above to reflect my findings.    Dorris Carnes

## 2014-08-22 NOTE — Care Management Note (Signed)
CARE MANAGEMENT NOTE 08/22/2014  Patient:  Randy [name], Randy [name]   Account Number:  [id number]  Date Initiated:  08/22/2014  Documentation initiated by:  Clela Hagadorn  Subjective/Objective Assessment:   CM following for progession and d/c planning.     Action/Plan:   Met with pt who lives with his wife who is able to assist him, he also drives and plans to drive himself to hemodialysis.   Anticipated DC Date:  08/24/2014   Anticipated DC Plan:  HOME/SELF CARE         Choice offered to / List presented to:             Status of service:   Medicare Important Message given?  YES (If response is "NO", the following Medicare IM given date fields will be blank) Date Medicare IM given:  08/22/2014 Medicare IM given by:  Calel Pisarski Date Additional Medicare IM given:   Additional Medicare IM given by:    Discharge Disposition:    Per UR Regulation:    If discussed at Long Length of Stay Meetings, dates discussed:    Comments:

## 2014-08-22 NOTE — Discharge Summary (Signed)
Physician Discharge Summary  Patient ID: Randy [name] MRN: [id number] DOB/AGE: 1932-06-07 80 y.o.   Primary Cardiologist: Dr. Domenic Polite  Admit date: 08/17/2014 Discharge date: 08/23/2014  Admission Diagnoses: Elevated Troponin; Acute Myopericarditis   Discharge Diagnoses:  Principal Problem:   Acute myopericarditis Active Problems:   End stage renal disease, has been peritoneal, now hemodialysis   Anemia of chronic disease   Elevated troponin   Discharged Condition: stable  Hospital Course: The patient is a 80 y/o AA male, followed by Dr. Domenic Polite, with a history of ESRD on home peritoneal dialysis and cardiomyopathy of uncertain etiology (last EF 45-50%).   He initially presented to [facility] on 08/17/14 with chest pain characterized as severe chest burning, exacerbated with deep inspiration and not relieved with SL NTG.  He was noted to have an elevated troponin at 3.25 and subsequently transferred to [facility] for further evaluation.   On arrival to [facility], he was noted to have a loud friction rub on physical exam. An echocardiogram revealed a small circumferential pericardial effusion. The pericardium did appear thickened. He was also noted to have a mildly dilated LV with EF estimated at 30-35% with diffuse hypokinesis.  There was concern for myopericarditis, probably related to renal disease. However, given ongoing chest pain, elevated troponin, and risk factors, it was decided to rule out ACS with a diagnostic LHC. The procedure was performed by Dr. Aundra Dubin. He was noted to have mild-moderate diffuse CAD. The most significant disease appeared to be 80% proximal stenoses in the moderate superior and inferior divisions of the ramus. However, theses lesions did not appear to be culprits for ACS (plaque rupture).   It was felt that myopericarditis in the setting of renal disease and probable inadequate dialysis (does PD at home) was likely the cause of his chest pain and elevated troponin. It was  decided to treat with high dose ASA, 650 mg q8Hr at first He was also given 1 dose of colchicine and started on prednisone. A PPI (Protonix) was initiated for GI protection.   Nephrology was consulted to assist with dialysis. The decision was made to transition from peritoneal to hemodialysis. Nephrology managed his dialysis during his hospital stay and arrange for outpatient dialysis as well. He will follow up with cardiology and with hemodialysis in [place].  On 12/01, he was seen by Dr. Harrington Challenger and all data were reviewed. His volume status is at baseline. He is ambulating without chest pain or shortness of breath. He was felt stable to decrease his aspirin but is to continue the anti-inflammatories at a lower dose. No further inpatient workup was indicated and he is considered stable for discharge, to follow up as an outpatient.  Consults: nephrology  Significant Diagnostic Studies:   2D echo 08/17/14 Study Conclusions - Left ventricle: The cavity size was mildly dilated. Systolic function was moderately to severely reduced. The estimated ejection fraction was in the range of 30% to 35%. Diffuse hypokinesis. Features are consistent with a pseudonormal left ventricular filling pattern, with concomitant abnormal relaxation and increased filling pressure (grade 2 diastolic dysfunction). - Aortic valve: Mild to moderate aortic stenosis (mild by mean gradient, moderate by calculated valve area - may be low gradient moderate AS). Trileaflet; moderately calcified leaflets. There was trivial regurgitation. Mean gradient (S): 13 mm Hg. Peak gradient (S): 30 mm Hg. Valve area (VTI): 1.37 cm^2. - Mitral valve: Mildly calcified annulus. There was mild regurgitation. - Left atrium: The atrium was moderately to severely dilated. - Right ventricle: The cavity  size was normal. Systolic function was normal. - Right atrium: The atrium was mildly to moderately dilated. - Tricuspid  valve: There was mild-moderate regurgitation. Peak RV-RA gradient (S): 75 mm Hg. - Pulmonary arteries: PA peak pressure: 83 mm Hg (S). - Systemic veins: IVC 2.2 cm with > 50% respirophasic variation, suggesting RA pressure 8 mmHg. - Pericardium, extracardiac: There was a left pleural effusion. Small circumferential pericardial effusion . The pericardium did appear thickened. Impressions: - Mildly dilated LV with EF 30-35%. DIffuse hypokinesis. Moderate diastolic dysfunction. Normal RV size and systolic function. Mild to moderate aortic stenosis (mild by mean gradient, moderate by calculated valve area). Biatrial enlargement. Severe pulmonary hypertension.   LHC 08/17/14 Procedural Findings: Hemodynamics: AO 129/76 Coronary angiography: Coronary dominance: right Left mainstem: Short, no significant disease.  Left anterior descending (LAD): There was a moderate D1 that branched early with 40% ostial stenosis. This was followed by a large septal perforator. There was 40-50% proximal LAD stenosis at the takeoff of the septal perforator. 30% mid LAD stenosis at D2.  Left circumflex (LCx): There was a large ramus that branched early into a superior and and inferior division, both vessels were moderate in size. Both divisions of the ramus had relatively up to 80% proximal stenosis. The AV LCx itself had luminal irregularities.  Right coronary artery (RCA): There was an early large acute marginal with 40% proximal and 40% mid vessel stenosis. The RCA had diffuse luminal irregularities and up to 40% mid vessel stenosis.  Left ventriculography: Not done, echo was done earlier today.    Treatments: See Hospital Course  Discharge Exam: Blood pressure 142/68, pulse 70, temperature 97.7 F (36.5 C), temperature source Oral, resp. rate 18, height 5\' 9"  (1.753 m), weight 160 lb 15 oz (73 kg), SpO2 97 %.  Labs:  Lab Results  Component Value Date   HGB 7.9*  08/22/2014   HCT 25.6* 08/22/2014   MCV 93.1 08/22/2014   PLT 321 08/22/2014    Recent Labs Lab 08/22/14 1253  NA 131*  K 4.0  CL 88*  CO2 21  BUN 66*  CREATININE 9.06*  CALCIUM 8.8  GLUCOSE 143*   Disposition: 01-Home or Self Care      Discharge Instructions    Diet - low sodium heart healthy    Complete by:  As directed      Increase activity slowly    Complete by:  As directed             Medication List    TAKE these medications        amLODipine 10 MG tablet  Commonly known as:  NORVASC  Take 1 tablet (10 mg total) by mouth daily.     aspirin 325 MG tablet  Take 1 tablet (325 mg total) by mouth 2 (two) times daily.     aspirin 81 MG EC tablet  Take 1 tablet (81 mg total) by mouth daily. HOLD while on Aspirin 325 mg.     calcitRIOL 0.5 MCG capsule  Commonly known as:  ROCALTROL  Take 1 capsule (0.5 mcg total) by mouth daily.     cloNIDine 0.1 MG tablet  Commonly known as:  CATAPRES  Take 1 tablet (0.1 mg total) by mouth 2 (two) times daily.     DSS 100 MG Caps  Take 100 mg by mouth 2 (two) times daily.     hydrALAZINE 25 MG tablet  Commonly known as:  APRESOLINE  Take 1 tablet (25 mg total) by  mouth 3 (three) times daily.     labetalol 200 MG tablet  Commonly known as:  NORMODYNE  Take 2 tablets (400 mg total) by mouth 2 (two) times daily.     multivitamin Tabs tablet  Take 1 tablet by mouth daily.     pantoprazole 40 MG tablet  Commonly known as:  PROTONIX  Take 1 tablet (40 mg total) by mouth daily at 12 noon.     polyethylene glycol packet  Commonly known as:  MIRALAX / GLYCOLAX  Take 17 g by mouth daily.     rOPINIRole 0.25 MG tablet  Commonly known as:  REQUIP  Take 0.25-0.5 mg by mouth at bedtime.     sevelamer carbonate 800 MG tablet  Commonly known as:  RENVELA  Take 2,400-4,000 mg by mouth 3 (three) times daily with meals. To take 5 with meals and 3 with snacks     simvastatin 40 MG tablet  Commonly known as:  ZOCOR    Take 1 tablet (40 mg total) by mouth every Monday, Wednesday, and Friday.       Follow-up Information    Follow up with Jory Sims, NP On 08/31/2014.   Specialty:  Nurse Practitioner   Why:  1:50 pm    Contact information:   [place] [address] [phone]       Follow up with [facility], MD.   Specialty:  Nephrology   Why:  Keep dialysis appointments. First one on Thursday.   Contact information:   [place] [address] [phone]       TIME SPENT ON DISCHARGE, INCLUDING PHYSICIAN TIME: >30 MINUTES  Signed: Rosaria Ferries 08/23/2014, 1:20 PM

## 2014-08-23 IMAGING — CR DG CHEST 2V
2 series · 2 of 2 positions shown · non-contrast
Comparison: 07/30/2012

CLINICAL DATA: Preadmission

CHEST - 2 VIEW

[view not recorded (1 of 2)]
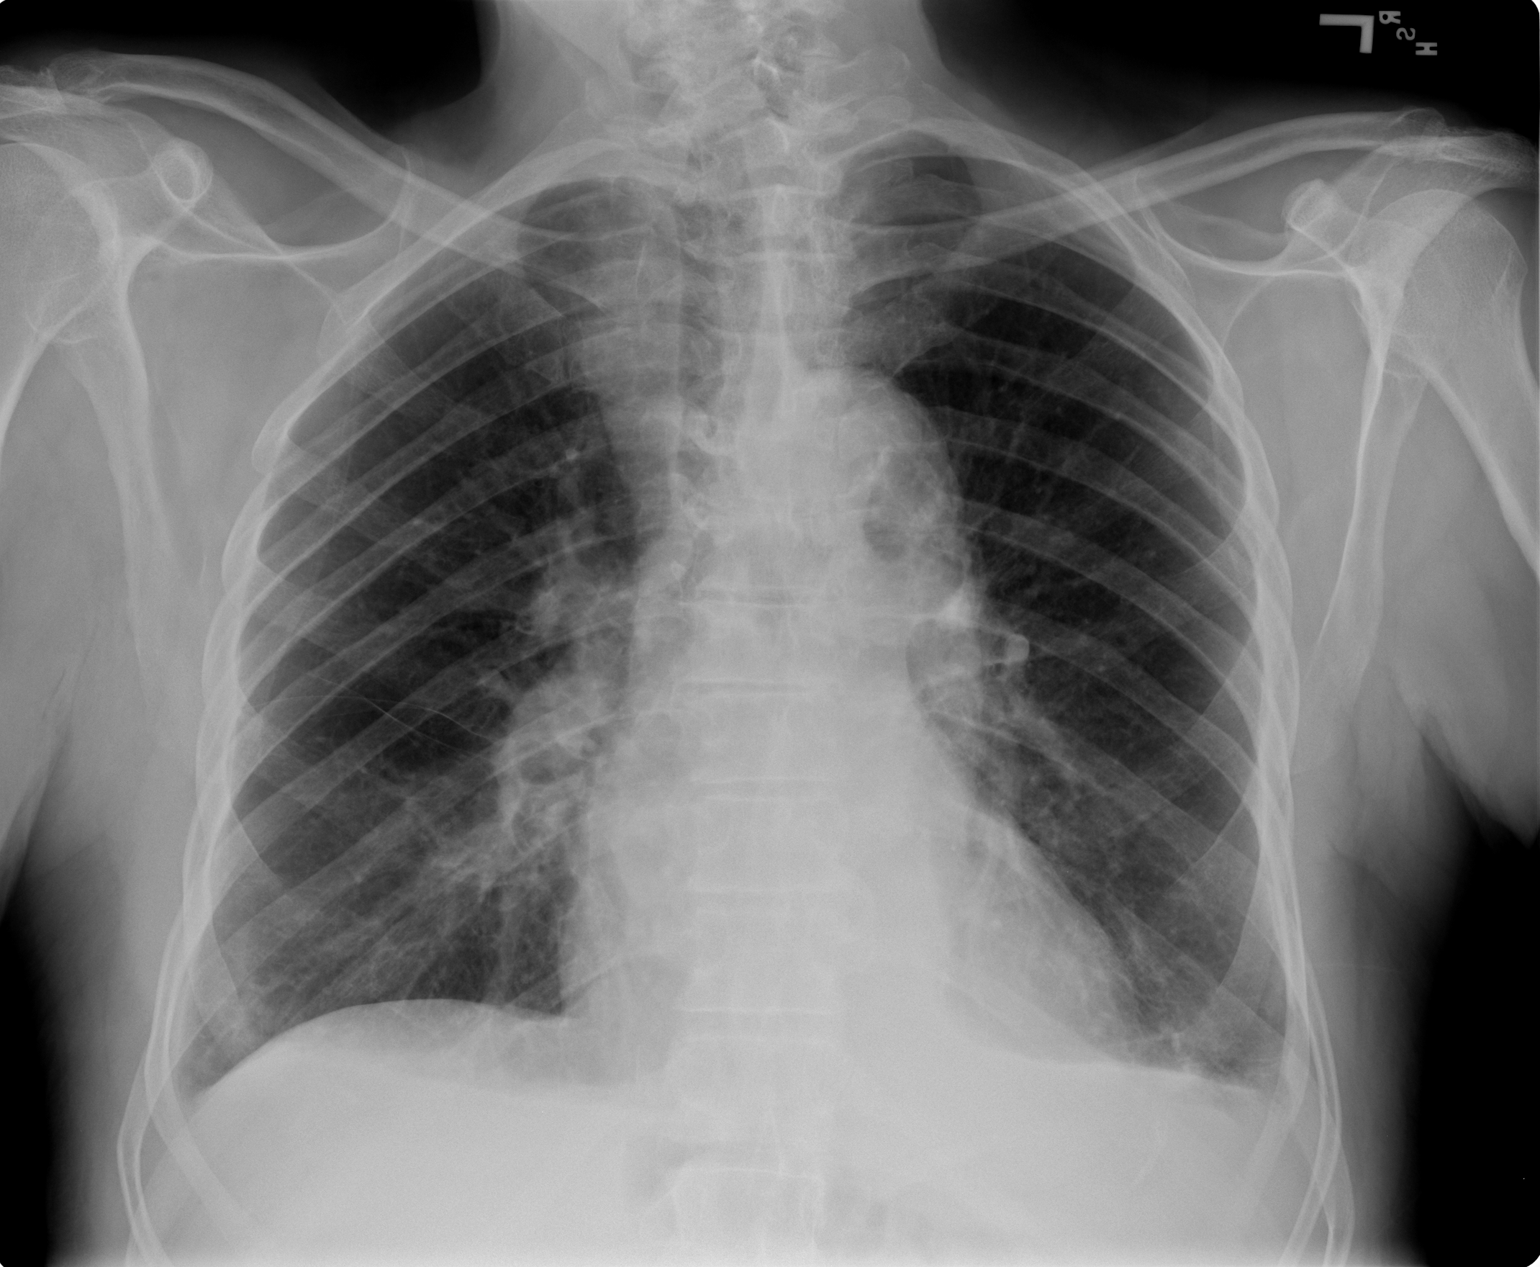

[view not recorded (2 of 2)]
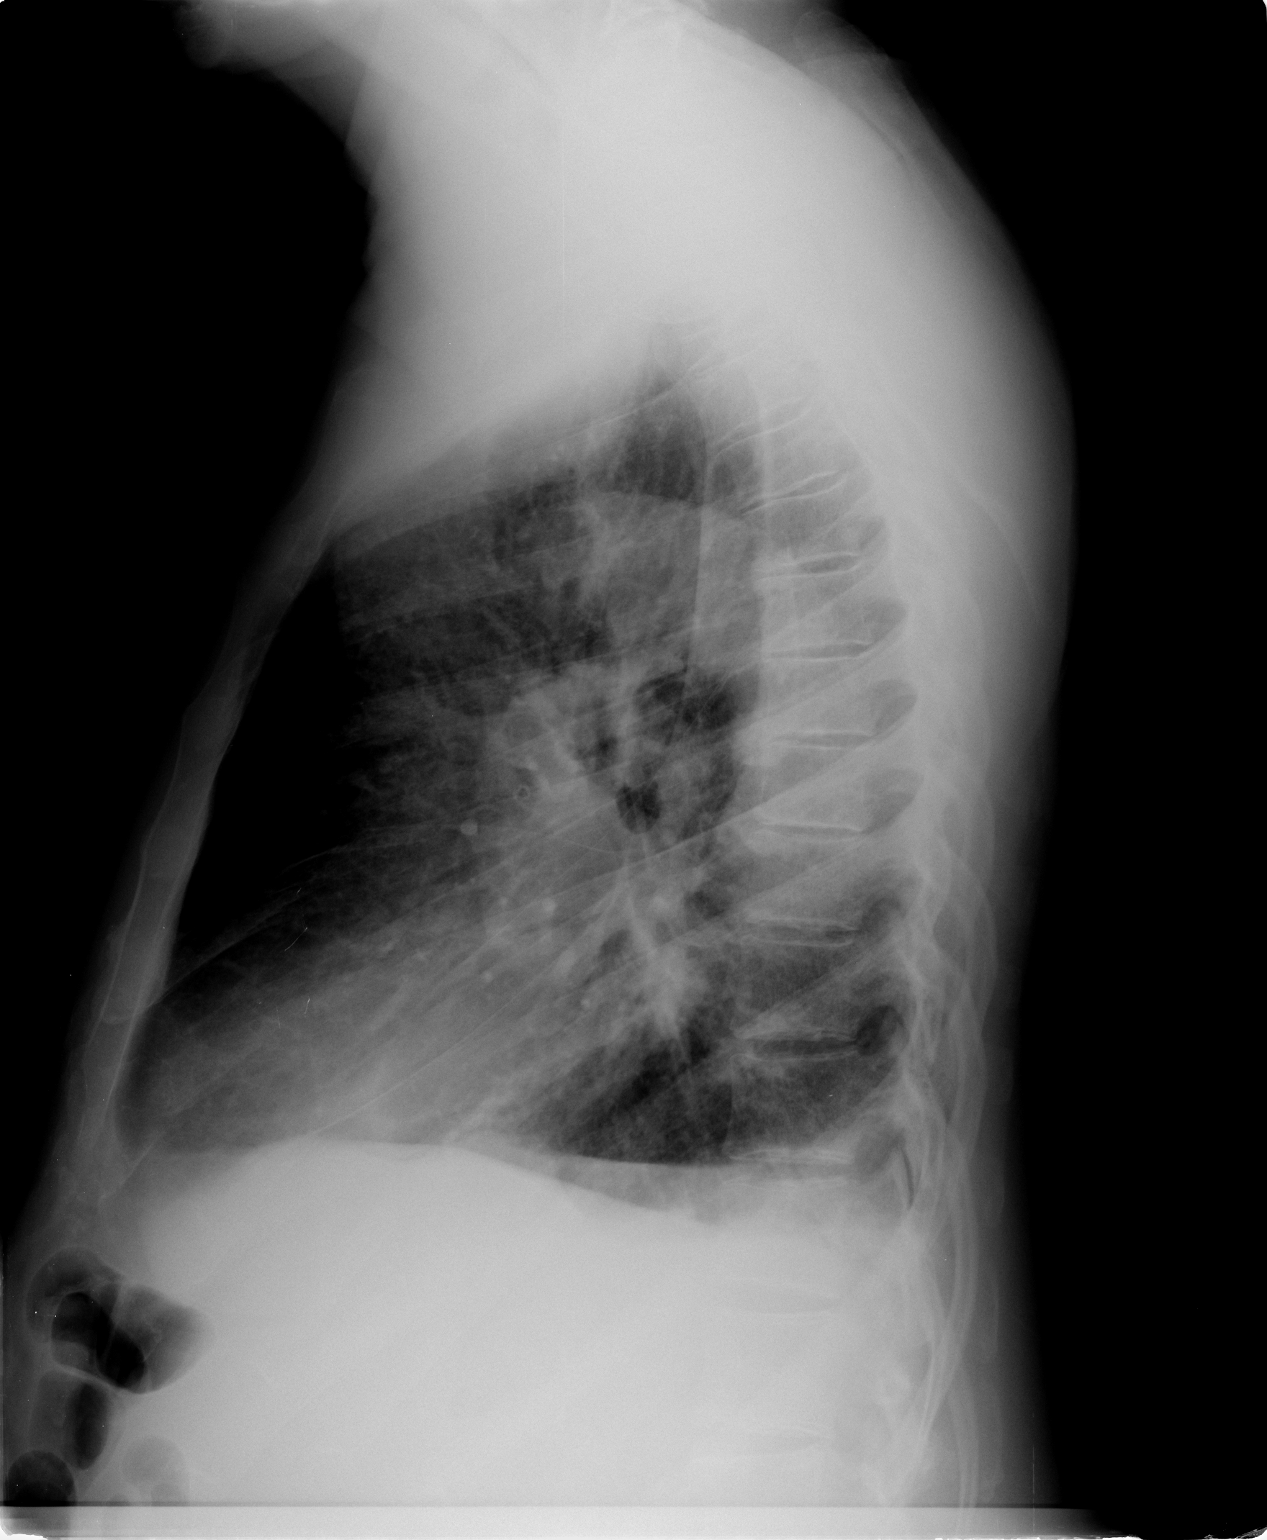

[2 of 2 positions shown; findings below may reference images not displayed]

FINDINGS: Cardiomediastinal silhouette is stable.  No acute
infiltrate or pleural effusion.  No pulmonary edema.  Stable mild
right upper paratracheal soft tissue prominence probable vascular
in nature.
IMPRESSION: No active disease.  No significant change.

## 2014-08-23 MED ORDER — POLYETHYLENE GLYCOL 3350 17 G PO PACK: 17.0000 g | PACK | Freq: Every day | ORAL | Status: DC

## 2014-08-23 MED ORDER — DSS 100 MG PO CAPS: 100.0000 mg | ORAL_CAPSULE | Freq: Two times a day (BID) | ORAL | Status: DC

## 2014-08-23 MED ORDER — PANTOPRAZOLE SODIUM 40 MG PO TBEC: 40.0000 mg | DELAYED_RELEASE_TABLET | Freq: Every day | ORAL | Status: DC

## 2014-08-23 MED ORDER — CLONIDINE HCL 0.1 MG PO TABS: 0.1000 mg | ORAL_TABLET | Freq: Two times a day (BID) | ORAL | Status: DC

## 2014-08-23 MED ORDER — ASPIRIN 81 MG PO TBEC: 81.0000 mg | DELAYED_RELEASE_TABLET | Freq: Every day | ORAL | Status: DC

## 2014-08-23 MED ORDER — HYDRALAZINE HCL 25 MG PO TABS: 25.0000 mg | ORAL_TABLET | Freq: Three times a day (TID) | ORAL | Status: DC

## 2014-08-23 MED ORDER — ASPIRIN 325 MG PO TABS
325.0000 mg | ORAL_TABLET | Freq: Two times a day (BID) | ORAL | Status: DC
Start: 2014-08-23 — End: 2015-03-07

## 2014-08-23 MED ORDER — SIMVASTATIN 40 MG PO TABS: 40.0000 mg | ORAL_TABLET | ORAL | Status: DC

## 2014-08-23 NOTE — Plan of Care (Signed)
Problem: Phase III Progression Outcomes Goal: VS Stable with increased activity Outcome: Completed/Met Date Met:  08/23/14

## 2014-08-23 NOTE — Progress Notes (Signed)
S:No new CO.  Ready to go home O:BP 142/68 mmHg  Pulse 70  Temp(Src) 97.7 F (36.5 C) (Oral)  Resp 18  Ht 5\' 9"  (1.753 m)  Wt 73 kg (160 lb 15 oz)  BMI 23.76 kg/m2  SpO2 97%  Intake/Output Summary (Last 24 hours) at 08/23/14 1103 Last data filed at 08/23/14 0900  Gross per 24 hour  Intake    640 ml  Output   2992 ml  Net  -2352 ml   Weight change: 0.7 kg (1 lb 8.7 oz) URK:YHCWC and alert CVS:RRR 3-7/6 systolic M Resp:cleatr Abd:+ BS NTND + PD cath Ext: no edema NEURO:CNI Ox3 no asterixis   . amLODipine  10 mg Oral Daily  . aspirin  650 mg Oral 3 times per day  . atorvastatin  80 mg Oral q1800  . calcitRIOL  0.5 mcg Oral Daily  . cloNIDine  0.1 mg Oral BID  . darbepoetin (ARANESP) injection - DIALYSIS  150 mcg Intravenous Q Fri-HD  . docusate sodium  100 mg Oral BID  . ferric gluconate (FERRLECIT/NULECIT) IV  125 mg Intravenous Q M,W,F-HD  . heparin  5,000 Units Subcutaneous 3 times per day  . hydrALAZINE  25 mg Oral 3 times per day  . labetalol  400 mg Oral BID  . multivitamin  1 tablet Oral QHS  . pantoprazole  40 mg Oral Q1200  . polyethylene glycol  17 g Oral Daily  . rOPINIRole  0.25-0.5 mg Oral QHS  . sevelamer carbonate  4,000 mg Oral TID WC  . sodium chloride  3 mL Intravenous Q12H   No results found. BMET    Component Value Date/Time   NA 131* 08/22/2014 1253   K 4.0 08/22/2014 1253   CL 88* 08/22/2014 1253   CO2 21 08/22/2014 1253   GLUCOSE 143* 08/22/2014 1253   BUN 66* 08/22/2014 1253   CREATININE 9.06* 08/22/2014 1253   CALCIUM 8.8 08/22/2014 1253   CALCIUM 8.1* 06/24/2012 1634   GFRNONAA 5* 08/22/2014 1253   GFRAA 6* 08/22/2014 1253   CBC    Component Value Date/Time   WBC 7.8 08/22/2014 1252   RBC 2.75* 08/22/2014 1252   RBC 3.21* 10/08/2012 2241   HGB 7.9* 08/22/2014 1252   HCT 25.6* 08/22/2014 1252   PLT 321 08/22/2014 1252   MCV 93.1 08/22/2014 1252   MCH 28.7 08/22/2014 1252   MCHC 30.9 08/22/2014 1252   RDW 17.8* 08/22/2014  1252   LYMPHSABS 0.8 08/17/2014 0625   MONOABS 1.0 08/17/2014 0625   EOSABS 0.1 08/17/2014 0625   BASOSABS 0.0 08/17/2014 0625     Assessment: 1. Pericarditis poss sec to under dialysis, switched from PD to HD 2. Anemia on aranesp 3. Sec HPTH on calcitriol 4. HTN 5. ESRD Plan: 1. Has outpt HD spot starting Thurs at [facility].  The unit will call him with the time.  He can be DC'd today   Randy [name]

## 2014-08-23 NOTE — Progress Notes (Signed)
Patient to be discharged to home with spouse. Discharge instructions reviewed. Printout instructions on hemodialysis, dialysis vascular access, and renal diet given to patient. PIV removed. Telemetry box #25 removed and returned to nurse's station.   Joellen Jersey, RN.

## 2014-08-23 NOTE — Progress Notes (Addendum)
CARDIAC REHAB PHASE I   PRE:  Rate/Rhythm: 73 SR with PVC's  BP:  Supine: 130/63  Sitting:   Standing:    SaO2: 98 RA  MODE:  Ambulation: 440 ft   POST:  Rate/Rhythm: 88  BP:  Supine:   Sitting: 154/74  Standing:    SaO2: 99 RA 1000-1025 On arrival pt in bed. Started out as assist X 2 to ambulate. Pt was able to ambulate as assist X1 and using walker. He states that he has a cane and walker at home. Pt was able to walk 440 feet. He c/o of weakness in his legs walking and SOB by end of walk. VS stable. SOB relieved with rest, RA sat after walk 99%. Pt to recliner after walk with call light in reach. I encouraged pt to use walker at home until he gets stronger.  Rodney Langton RN 08/23/2014 10:25 AM

## 2014-08-23 NOTE — Plan of Care (Signed)
Problem: Discharge Progression Outcomes Goal: Barriers To Progression Addressed/Resolved Outcome: Completed/Met Date Met:  08/23/14 Goal: Discharge plan in place and appropriate Outcome: Completed/Met Date Met:  08/23/14 Goal: Anginal pain absent Outcome: Completed/Met Date Met:  08/23/14 Goal: Hemodynamically stable Outcome: Completed/Met Date Met:  32/41/99 Goal: Complications resolved/controlled Outcome: Completed/Met Date Met:  08/23/14 Goal: Tolerates diet Outcome: Completed/Met Date Met:  08/23/14 Goal: Lipid-lowering therapy prescribed Outcome: Completed/Met Date Met:  08/23/14 Goal: Other Discharge Outcomes/Goals Outcome: Not Applicable Date Met:  14/44/58

## 2014-08-23 NOTE — Progress Notes (Signed)
   Subjective: Denies CP  No SOB  Objective: Filed Vitals:   08/22/14 1946 08/22/14 2020 08/23/14 0601 08/23/14 1000  BP: 131/63 147/65 123/61 142/68  Pulse: 96 84 71 70  Temp: 99.2 F (37.3 C) 98.4 F (36.9 C) 98.4 F (36.9 C) 97.7 F (36.5 C)  TempSrc:  Oral Oral Oral  Resp: 18 18 16 18   Height:      Weight: 160 lb 15 oz (73 kg)     SpO2: 96% 98% 98% 97%   Weight change: 1 lb 8.7 oz (0.7 kg)  Intake/Output Summary (Last 24 hours) at 08/23/14 1210 Last data filed at 08/23/14 0900  Gross per 24 hour  Intake    640 ml  Output   2992 ml  Net  -2352 ml    General: Alert, awake, oriented x3, in no acute distress Neck:  JVP is normal Heart: Regular rate and rhythm, without murmurs, rubs, gallops.  Lungs: Clear to auscultation.  No rales or wheezes. Exemities:  No edema.   Neuro: Grossly intact, nonfocal.   Lab Results: Results for orders placed or performed during the hospital encounter of 08/17/14 (from the past 24 hour(s))  CBC     Status: Abnormal   Collection Time: 08/22/14 12:52 PM  Result Value Ref Range   WBC 7.8 4.0 - 10.5 K/uL   RBC 2.75 (L) 4.22 - 5.81 MIL/uL   Hemoglobin 7.9 (L) 13.0 - 17.0 g/dL   HCT 25.6 (L) 39.0 - 52.0 %   MCV 93.1 78.0 - 100.0 fL   MCH 28.7 26.0 - 34.0 pg   MCHC 30.9 30.0 - 36.0 g/dL   RDW 17.8 (H) 11.5 - 15.5 %   Platelets 321 150 - 400 K/uL  Renal function panel     Status: Abnormal   Collection Time: 08/22/14 12:53 PM  Result Value Ref Range   Sodium 131 (L) 137 - 147 mEq/L   Potassium 4.0 3.7 - 5.3 mEq/L   Chloride 88 (L) 96 - 112 mEq/L   CO2 21 19 - 32 mEq/L   Glucose, Bld 143 (H) 70 - 99 mg/dL   BUN 66 (H) 6 - 23 mg/dL   Creatinine, Ser 9.06 (H) 0.50 - 1.35 mg/dL   Calcium 8.8 8.4 - 10.5 mg/dL   Phosphorus 4.9 (H) 2.3 - 4.6 mg/dL   Albumin 2.2 (L) 3.5 - 5.2 g/dL   GFR calc non Af Amer 5 (L) >90 mL/min   GFR calc Af Amer 6 (L) >90 mL/min   Anion gap 22 (H) 5 - 15    Studies/Results: No results found.  Medications:  Reviewed}   @PROBHOSP @  1.  Myopericarditis  Patient doing well  Denies pain .  I would recomm cutting back on ASA now that getting dialyzed  Close f/u as outpatinet with quick taper.  Finish prednisone tomorrow  2.  CAD  MOderate at cath  Keep on current meds  3.  Chronic systolic CHF.  Volume status OK    4.  ESRD  Outpatient dialysis has been set up  5  Anemia  Followed by renal service     LOS: 6 days   Dorris Carnes 08/23/2014, 12:10 PM

## 2014-08-23 NOTE — Plan of Care (Signed)
Problem: Phase III Progression Outcomes Goal: Discharge plan remains appropriate-arrangements made Outcome: Completed/Met Date Met:  08/23/14

## 2014-08-29 ENCOUNTER — Emergency Department (HOSPITAL_COMMUNITY): Payer: Medicare Other

## 2014-08-29 ENCOUNTER — Inpatient Hospital Stay (HOSPITAL_COMMUNITY)
Admission: EM | Admit: 2014-08-29 | Discharge: 2014-09-02 | DRG: 291 | Disposition: A | Discharge status: Home / Self Care | Payer: Medicare Other | Attending: Family Medicine | Admitting: Family Medicine

## 2014-08-29 ENCOUNTER — Encounter (HOSPITAL_COMMUNITY): Payer: Self-pay | Admitting: Cardiology

## 2014-08-29 DIAGNOSIS — E780 Pure hypercholesterolemia: Secondary | ICD-10-CM | POA: Diagnosis present

## 2014-08-29 DIAGNOSIS — I350 Nonrheumatic aortic (valve) stenosis: Secondary | ICD-10-CM | POA: Diagnosis present

## 2014-08-29 DIAGNOSIS — M1990 Unspecified osteoarthritis, unspecified site: Secondary | ICD-10-CM | POA: Diagnosis present

## 2014-08-29 DIAGNOSIS — E876 Hypokalemia: Secondary | ICD-10-CM | POA: Diagnosis present

## 2014-08-29 DIAGNOSIS — Z992 Dependence on renal dialysis: Secondary | ICD-10-CM | POA: Diagnosis not present

## 2014-08-29 DIAGNOSIS — D631 Anemia in chronic kidney disease: Secondary | ICD-10-CM | POA: Diagnosis present

## 2014-08-29 DIAGNOSIS — D638 Anemia in other chronic diseases classified elsewhere: Secondary | ICD-10-CM

## 2014-08-29 DIAGNOSIS — N186 End stage renal disease: Secondary | ICD-10-CM

## 2014-08-29 DIAGNOSIS — I132 Hypertensive heart and chronic kidney disease with heart failure and with stage 5 chronic kidney disease, or end stage renal disease: Secondary | ICD-10-CM | POA: Diagnosis present

## 2014-08-29 DIAGNOSIS — I2510 Atherosclerotic heart disease of native coronary artery without angina pectoris: Secondary | ICD-10-CM | POA: Diagnosis present

## 2014-08-29 DIAGNOSIS — Z87891 Personal history of nicotine dependence: Secondary | ICD-10-CM | POA: Diagnosis not present

## 2014-08-29 DIAGNOSIS — E8771 Transfusion associated circulatory overload: Secondary | ICD-10-CM

## 2014-08-29 DIAGNOSIS — I739 Peripheral vascular disease, unspecified: Secondary | ICD-10-CM | POA: Diagnosis present

## 2014-08-29 DIAGNOSIS — E889 Metabolic disorder, unspecified: Secondary | ICD-10-CM | POA: Diagnosis present

## 2014-08-29 DIAGNOSIS — J9601 Acute respiratory failure with hypoxia: Secondary | ICD-10-CM | POA: Diagnosis present

## 2014-08-29 DIAGNOSIS — M109 Gout, unspecified: Secondary | ICD-10-CM | POA: Diagnosis present

## 2014-08-29 DIAGNOSIS — E8770 Fluid overload, unspecified: Secondary | ICD-10-CM | POA: Diagnosis present

## 2014-08-29 DIAGNOSIS — R0602 Shortness of breath: Secondary | ICD-10-CM | POA: Diagnosis present

## 2014-08-29 DIAGNOSIS — I509 Heart failure, unspecified: Secondary | ICD-10-CM

## 2014-08-29 DIAGNOSIS — Z9981 Dependence on supplemental oxygen: Secondary | ICD-10-CM | POA: Diagnosis not present

## 2014-08-29 DIAGNOSIS — I252 Old myocardial infarction: Secondary | ICD-10-CM | POA: Diagnosis not present

## 2014-08-29 DIAGNOSIS — I5043 Acute on chronic combined systolic (congestive) and diastolic (congestive) heart failure: Secondary | ICD-10-CM | POA: Diagnosis present

## 2014-08-29 DIAGNOSIS — Z7982 Long term (current) use of aspirin: Secondary | ICD-10-CM | POA: Diagnosis not present

## 2014-08-29 DIAGNOSIS — J9602 Acute respiratory failure with hypercapnia: Secondary | ICD-10-CM

## 2014-08-29 DIAGNOSIS — I255 Ischemic cardiomyopathy: Secondary | ICD-10-CM | POA: Diagnosis present

## 2014-08-29 DIAGNOSIS — R7989 Other specified abnormal findings of blood chemistry: Secondary | ICD-10-CM

## 2014-08-29 DIAGNOSIS — I248 Other forms of acute ischemic heart disease: Secondary | ICD-10-CM | POA: Diagnosis present

## 2014-08-29 DIAGNOSIS — I110 Hypertensive heart disease with heart failure: Secondary | ICD-10-CM

## 2014-08-29 LAB — CBC WITH DIFFERENTIAL/PLATELET
BASOS ABS: 0 10*3/uL (ref 0.0–0.1)
BASOS PCT: 0 % (ref 0–1)
EOS PCT: 1 % (ref 0–5)
Eosinophils Absolute: 0.1 10*3/uL (ref 0.0–0.7)
HCT: 31 % — ABNORMAL LOW (ref 39.0–52.0)
Hemoglobin: 9.5 g/dL — ABNORMAL LOW (ref 13.0–17.0)
Lymphocytes Relative: 6 % — ABNORMAL LOW (ref 12–46)
Lymphs Abs: 0.7 10*3/uL (ref 0.7–4.0)
MCH: 30.3 pg (ref 26.0–34.0)
MCHC: 30.6 g/dL (ref 30.0–36.0)
MCV: 98.7 fL (ref 78.0–100.0)
Monocytes Absolute: 0.8 10*3/uL (ref 0.1–1.0)
Monocytes Relative: 7 % (ref 3–12)
NEUTROS ABS: 9.5 10*3/uL — AB (ref 1.7–7.7)
Neutrophils Relative %: 86 % — ABNORMAL HIGH (ref 43–77)
PLATELETS: 374 10*3/uL (ref 150–400)
RBC: 3.14 MIL/uL — ABNORMAL LOW (ref 4.22–5.81)
RDW: 18.2 % — AB (ref 11.5–15.5)
WBC: 11.1 10*3/uL — ABNORMAL HIGH (ref 4.0–10.5)

## 2014-08-29 LAB — MRSA PCR SCREENING: MRSA by PCR: NEGATIVE

## 2014-08-29 LAB — PRO B NATRIURETIC PEPTIDE: Pro B Natriuretic peptide (BNP): >70000 pg/mL — ABNORMAL HIGH (ref 0–450)

## 2014-08-29 LAB — BASIC METABOLIC PANEL
ANION GAP: 17 — AB (ref 5–15)
BUN: 50 mg/dL — ABNORMAL HIGH (ref 6–23)
CHLORIDE: 96 meq/L (ref 96–112)
CO2: 28 mEq/L (ref 19–32)
Calcium: 9.5 mg/dL (ref 8.4–10.5)
Creatinine, Ser: 8.31 mg/dL — ABNORMAL HIGH (ref 0.50–1.35)
GFR calc non Af Amer: 5 mL/min — ABNORMAL LOW (ref >90)
GFR, EST AFRICAN AMERICAN: 6 mL/min — AB (ref >90)
Glucose, Bld: 115 mg/dL — ABNORMAL HIGH (ref 70–99)
POTASSIUM: 4.2 meq/L (ref 3.7–5.3)
SODIUM: 141 meq/L (ref 137–147)

## 2014-08-29 LAB — TROPONIN I
TROPONIN I: 0.31 ng/mL — AB (ref <0.30)
Troponin I: 0.42 ng/mL (ref <0.30)

## 2014-08-29 MED ORDER — CLONIDINE HCL 0.1 MG PO TABS
0.1000 mg | ORAL_TABLET | Freq: Two times a day (BID) | ORAL | Status: DC
  Administered 2014-08-29: 0.1 mg via ORAL
  Filled 2014-08-29: qty 1

## 2014-08-29 MED ORDER — HEPARIN SODIUM (PORCINE) 5000 UNIT/ML IJ SOLN
5000.0000 [IU] | Freq: Three times a day (TID) | INTRAMUSCULAR | Status: DC
  Administered 2014-08-29 – 2014-09-02 (×11): 5000 [IU] via SUBCUTANEOUS
  Filled 2014-08-29 (×9): qty 1

## 2014-08-29 MED ORDER — FUROSEMIDE 10 MG/ML IJ SOLN
40.0000 mg | Freq: Once | INTRAMUSCULAR | Status: AC
  Administered 2014-08-29: 40 mg via INTRAVENOUS
  Filled 2014-08-29: qty 4

## 2014-08-29 MED ORDER — ONDANSETRON HCL 4 MG/2ML IJ SOLN: 4.0000 mg | Freq: Four times a day (QID) | INTRAMUSCULAR | Status: DC | PRN

## 2014-08-29 MED ORDER — ROPINIROLE HCL 0.25 MG PO TABS
ORAL_TABLET | ORAL | Status: AC
  Filled 2014-08-29: qty 2

## 2014-08-29 MED ORDER — AMLODIPINE BESYLATE 5 MG PO TABS
10.0000 mg | ORAL_TABLET | Freq: Every day | ORAL | Status: DC
  Administered 2014-08-29: 10 mg via ORAL
  Filled 2014-08-29: qty 2

## 2014-08-29 MED ORDER — ATORVASTATIN CALCIUM 40 MG PO TABS
40.0000 mg | ORAL_TABLET | Freq: Every day | ORAL | Status: DC
  Administered 2014-08-29: 40 mg via ORAL
  Filled 2014-08-29 (×2): qty 1

## 2014-08-29 MED ORDER — SEVELAMER CARBONATE 800 MG PO TABS
4000.0000 mg | ORAL_TABLET | Freq: Three times a day (TID) | ORAL | Status: DC
  Administered 2014-08-30 – 2014-09-02 (×10): 4000 mg via ORAL
  Filled 2014-08-29 (×10): qty 5

## 2014-08-29 MED ORDER — POLYETHYLENE GLYCOL 3350 17 G PO PACK
17.0000 g | PACK | Freq: Every day | ORAL | Status: DC
  Administered 2014-08-29 – 2014-09-01 (×4): 17 g via ORAL
  Filled 2014-08-29 (×5): qty 1

## 2014-08-29 MED ORDER — SODIUM CHLORIDE 0.9 % IJ SOLN
10.0000 mL | INTRAMUSCULAR | Status: DC | PRN
  Administered 2014-08-29: 10 mL via INTRAVENOUS
  Filled 2014-08-29: qty 10

## 2014-08-29 MED ORDER — SODIUM CHLORIDE 0.9 % IV SOLN: 250.0000 mL | INTRAVENOUS | Status: DC | PRN

## 2014-08-29 MED ORDER — ONDANSETRON HCL 4 MG PO TABS: 4.0000 mg | ORAL_TABLET | Freq: Four times a day (QID) | ORAL | Status: DC | PRN

## 2014-08-29 MED ORDER — ASPIRIN 325 MG PO TABS
325.0000 mg | ORAL_TABLET | Freq: Two times a day (BID) | ORAL | Status: DC
  Administered 2014-08-29 – 2014-09-02 (×8): 325 mg via ORAL
  Filled 2014-08-29 (×8): qty 1

## 2014-08-29 MED ORDER — CALCITRIOL 0.25 MCG PO CAPS
0.5000 ug | ORAL_CAPSULE | Freq: Every day | ORAL | Status: DC
  Administered 2014-08-29 – 2014-09-02 (×5): 0.5 ug via ORAL
  Filled 2014-08-29 (×6): qty 2

## 2014-08-29 MED ORDER — ROPINIROLE HCL 0.25 MG PO TABS
0.2500 mg | ORAL_TABLET | Freq: Every day | ORAL | Status: DC
  Administered 2014-08-29 – 2014-08-30 (×2): 0.5 mg via ORAL
  Administered 2014-08-31 – 2014-09-01 (×2): 0.25 mg via ORAL
  Filled 2014-08-29 (×7): qty 2

## 2014-08-29 MED ORDER — ALTEPLASE 2 MG IJ SOLR: 2.0000 mg | Freq: Once | INTRAMUSCULAR | Status: AC | PRN

## 2014-08-29 MED ORDER — HYDRALAZINE HCL 25 MG PO TABS
25.0000 mg | ORAL_TABLET | Freq: Three times a day (TID) | ORAL | Status: DC
  Administered 2014-08-29 – 2014-08-30 (×3): 25 mg via ORAL
  Filled 2014-08-29 (×3): qty 1

## 2014-08-29 MED ORDER — PANTOPRAZOLE SODIUM 40 MG PO TBEC
40.0000 mg | DELAYED_RELEASE_TABLET | Freq: Every day | ORAL | Status: DC
  Administered 2014-08-29 – 2014-09-02 (×5): 40 mg via ORAL
  Filled 2014-08-29 (×5): qty 1

## 2014-08-29 MED ORDER — RENA-VITE PO TABS
1.0000 | ORAL_TABLET | Freq: Every day | ORAL | Status: DC
  Administered 2014-08-30 – 2014-09-02 (×4): 1 via ORAL
  Filled 2014-08-29 (×4): qty 1

## 2014-08-29 MED ORDER — MORPHINE SULFATE 2 MG/ML IJ SOLN
1.0000 mg | INTRAMUSCULAR | Status: DC | PRN
  Administered 2014-08-30 (×3): 1 mg via INTRAVENOUS
  Filled 2014-08-29 (×3): qty 1

## 2014-08-29 MED ORDER — SEVELAMER CARBONATE 800 MG PO TABS: 2400.0000 mg | ORAL_TABLET | ORAL | Status: DC | PRN

## 2014-08-29 MED ORDER — LABETALOL HCL 200 MG PO TABS
400.0000 mg | ORAL_TABLET | Freq: Two times a day (BID) | ORAL | Status: DC
  Administered 2014-08-29: 400 mg via ORAL
  Filled 2014-08-29: qty 2

## 2014-08-29 MED ORDER — SODIUM CHLORIDE 0.9 % IV SOLN: 100.0000 mL | INTRAVENOUS | Status: DC | PRN

## 2014-08-29 MED ORDER — PENTAFLUOROPROP-TETRAFLUOROETH EX AERO
1.0000 "application " | INHALATION_SPRAY | CUTANEOUS | Status: DC | PRN
  Filled 2014-08-29: qty 30

## 2014-08-29 MED ORDER — DOCUSATE SODIUM 100 MG PO CAPS
100.0000 mg | ORAL_CAPSULE | Freq: Two times a day (BID) | ORAL | Status: DC
  Administered 2014-08-29 – 2014-09-01 (×6): 100 mg via ORAL
  Filled 2014-08-29 (×7): qty 1

## 2014-08-29 MED ORDER — HEPARIN SODIUM (PORCINE) 1000 UNIT/ML DIALYSIS
1000.0000 [IU] | INTRAMUSCULAR | Status: DC | PRN
  Filled 2014-08-29: qty 1

## 2014-08-29 MED ORDER — NEPRO/CARBSTEADY PO LIQD: 237.0000 mL | ORAL | Status: DC | PRN

## 2014-08-29 MED ORDER — SODIUM CHLORIDE 0.9 % IJ SOLN
3.0000 mL | Freq: Two times a day (BID) | INTRAMUSCULAR | Status: DC
  Administered 2014-08-29 – 2014-09-01 (×6): 3 mL via INTRAVENOUS

## 2014-08-29 MED ORDER — LIDOCAINE HCL (PF) 1 % IJ SOLN: 5.0000 mL | INTRAMUSCULAR | Status: DC | PRN

## 2014-08-29 MED ORDER — ACETAMINOPHEN 325 MG PO TABS: 650.0000 mg | ORAL_TABLET | Freq: Four times a day (QID) | ORAL | Status: DC | PRN

## 2014-08-29 MED ORDER — NITROGLYCERIN 2 % TD OINT
1.0000 [in_us] | TOPICAL_OINTMENT | Freq: Once | TRANSDERMAL | Status: AC
  Administered 2014-08-29: 1 [in_us] via TOPICAL
  Filled 2014-08-29: qty 1

## 2014-08-29 MED ORDER — SODIUM CHLORIDE 0.9 % IJ SOLN: 3.0000 mL | INTRAMUSCULAR | Status: DC | PRN

## 2014-08-29 MED ORDER — ACETAMINOPHEN 650 MG RE SUPP: 650.0000 mg | Freq: Four times a day (QID) | RECTAL | Status: DC | PRN

## 2014-08-29 MED ORDER — LIDOCAINE-PRILOCAINE 2.5-2.5 % EX CREA
1.0000 "application " | TOPICAL_CREAM | CUTANEOUS | Status: DC | PRN
  Filled 2014-08-29: qty 5

## 2014-08-29 NOTE — H&P (Signed)
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 He presents to the hospital today with shortness of breath. He had his last hemodialysis session on Saturday which he completed. His shortness of breath progressed to today we had to seek medical attention. Because of the severe to severe shortness of breath was placed on BiPAP upon arrival to the emergency department. Given his end-stage renal disease state, nephrology was consulted for emergent hemodialysis which he is currently receiving. Has already been seen by cardiology in the emergency department. We have been asked to admit him for further evaluation and management.  Allergies:  No Known Allergies    Past Medical History  Diagnosis Date  . Hypertensive heart disease   . Hypercholesterolemia   . Gout   . Secondary cardiomyopathy     LVEF 40-45%  . Anemia of chronic disease   . Hypertension   . Arthritis   . Peripheral vascular disease   . ESRD on peritoneal dialysis   . On home oxygen therapy     "use what I need to" (08/17/2014)  . Pneumonia 09/2012  . Pericarditis 08/17/2014  . NSTEMI (non-ST elevated myocardial infarction) 08/17/2014    Past Surgical History  Procedure Laterality Date  . Knee arthroscopy Right 2007  . Back surgery    . Hemorrhoid surgery  1970's  . Ganglion cyst excision  01/03/2012    Procedure: REMOVAL GANGLION OF WRIST;  Surgeon: Scherry Ran, MD;  Location: [facility];  Service: General;  Laterality: Right;  .  Colonoscopy    . Av fistula placement  08/24/2012    Procedure: ARTERIOVENOUS (AV) FISTULA CREATION;  Surgeon: Rosetta Posner, MD;  Location: [facility];  Service: Vascular;  Laterality: Left;  . Insertion of dialysis catheter Right      neck  . Insertion of dialysis catheter  10/19/2012    Procedure: INSERTION OF DIALYSIS CATHETER;  Surgeon: Rosetta Posner, MD;  Location: [facility];  Service: Vascular;  Laterality: N/A;  REMOVE TEMPORARY CATH  . Cardiac catheterization  08/17/2014  . Cholecystectomy    . Lumbar disc surgery  2004    Prior to Admission medications   Medication Sig Start Date End Date Taking? Authorizing Provider  amLODipine (NORVASC) 10 MG tablet Take 1 tablet (10 mg total) by mouth daily. 06/23/14  Yes [name], MD  aspirin 325 MG tablet Take 1 tablet (325 mg total) by mouth 2 (two) times daily. 08/23/14  Yes Rhonda G Barrett, PA-C  calcitRIOL (ROCALTROL) 0.5 MCG capsule Take 1 capsule (0.5 mcg total) by mouth daily. 06/23/14  Yes [name], MD  cloNIDine (CATAPRES) 0.1 MG tablet Take 1 tablet (0.1 mg total) by mouth 2 (two) times daily. 08/23/14  Yes Rhonda G Barrett, PA-C  docusate sodium 100 MG CAPS Take 100 mg by mouth 2 (two) times daily. 08/23/14  Yes Rhonda G Barrett, PA-C  hydrALAZINE (APRESOLINE) 25 MG tablet Take 1 tablet (25 mg total) by mouth  3 (three) times daily. 08/23/14  Yes Rhonda G Barrett, PA-C  labetalol (NORMODYNE) 200 MG tablet Take 2 tablets (400 mg total) by mouth 2 (two) times daily. 07/17/12  Yes Satira Sark, MD  multivitamin (RENA-VIT) TABS tablet Take 1 tablet by mouth daily.   Yes Historical Provider, MD  pantoprazole (PROTONIX) 40 MG tablet Take 1 tablet (40 mg total) by mouth daily at 12 noon. 08/23/14  Yes Rhonda G Barrett, PA-C  polyethylene glycol (MIRALAX / GLYCOLAX) packet Take 17 g by mouth daily. 08/23/14  Yes Rhonda G Barrett, PA-C  rOPINIRole (REQUIP) 0.25 MG tablet Take 0.25-0.5 mg by mouth at bedtime. Takes depending on bad  restless legs are.   Yes Historical Provider, MD  sevelamer carbonate (RENVELA) 800 MG tablet Take 2,400-4,000 mg by mouth 3 (three) times daily with meals. To take 5 with meals and 3 with snacks   Yes Historical Provider, MD  simvastatin (ZOCOR) 40 MG tablet Take 1 tablet (40 mg total) by mouth every Monday, Wednesday, and Friday. 08/23/14  Yes Rhonda G Barrett, PA-C  aspirin 81 MG EC tablet Take 1 tablet (81 mg total) by mouth daily. HOLD while on Aspirin 325 mg. Patient not taking: Reported on 08/29/2014 08/23/14   Evelene Croon Barrett, PA-C    Social History:  reports that he quit smoking about 16 years ago. His smoking use included Cigarettes. He has a 30 pack-year smoking history. He quit smokeless tobacco use about 20 years ago. His smokeless tobacco use included Chew. He reports that he does not drink alcohol or use illicit drugs.  Family History  Problem Relation Age of Onset  . Arthritis    . Cancer    . Kidney disease    . Anesthesia problems Neg Hx   . Hypotension Neg Hx   . Malignant hyperthermia Neg Hx   . Pseudochol deficiency Neg Hx   . Cancer Sister   . Cancer Brother     colon  . Cancer Brother     colon    Review of Systems:  Constitutional: Denies fever, chills, , appetite change and fatigue.  HEENT: Denies photophobia, eye pain, redness, hearing loss, ear pain, congestion, sore throat, rhinorrhea, sneezing, mouth sores, trouble swallowing, neck pain, neck stiffness and tinnitus.   Respiratory: Denies  cough, chest tightness,  and wheezing.   Cardiovascular: Denies chest pain, palpitations and leg swelling.  Gastrointestinal: Denies nausea, vomiting, abdominal pain, diarrhea, constipation, blood in stool and abdominal distention.  Genitourinary: Denies dysuria, urgency, frequency, hematuria, flank pain and difficulty urinating.  Endocrine: Denies: hot or cold intolerance, sweats, changes in hair or nails, polyuria, polydipsia. Musculoskeletal: Denies myalgias, back  pain, joint swelling, arthralgias and gait problem.  Skin: Denies pallor, rash and wound.  Neurological: Denies dizziness, seizures, syncope, weakness, light-headedness, numbness and headaches.  Hematological: Denies adenopathy. Easy bruising, personal or family bleeding history  Psychiatric/Behavioral: Denies suicidal ideation, mood changes, confusion, nervousness, sleep disturbance and agitation   Physical Exam: Blood pressure 156/87, pulse 98, temperature 97.6 F (36.4 C), temperature source Axillary, resp. rate 22, height _0  (1.753 m), weight 72.576 kg (160 lb), SpO2 98 %. General: Alert, awake, oriented 3, currently on BiPAP with improved respiratory distress now that he is receiving dialysis. HEENT: Normocephalic, atraumatic, pupils equal round and reactive to light, extraocular movements intact, moist mucous membranes, Neck: Supple, no lymphadenopathy, no bruits, no goiter. Cardiovascular: Regular rate and rhythm, no murmurs, rubs or gallops. Lungs: Bibasilar crackles Abdomen: Soft, nontender, nondistended, positive bowel  sounds. Next and extremities: 1-2+ pedal edema bilaterally. Neurologic: Grossly intact and nonfocal, I have not ambulated him  Labs on Admission:  Results for orders placed or performed during the hospital encounter of 08/29/14 (from the past 48 hour(s))  Basic metabolic panel     Status: Abnormal   Collection Time: 08/29/14  9:28 AM  Result Value Ref Range   Sodium 141 137 - 147 mEq/L   Potassium 4.2 3.7 - 5.3 mEq/L   Chloride 96 96 - 112 mEq/L   CO2 28 19 - 32 mEq/L   Glucose, Bld 115 (H) 70 - 99 mg/dL   BUN 50 (H) 6 - 23 mg/dL   Creatinine, Ser 8.31 (H) 0.50 - 1.35 mg/dL   Calcium 9.5 8.4 - 10.5 mg/dL   GFR calc non Af Amer 5 (L) >90 mL/min   GFR calc Af Amer 6 (L) >90 mL/min    Comment: (NOTE) The eGFR has been calculated using the CKD EPI equation. This calculation has not been validated in all clinical situations. eGFR's persistently <90 mL/min  signify possible Chronic Kidney Disease.    Anion gap 17 (H) 5 - 15  Pro b natriuretic peptide (BNP)     Status: Abnormal   Collection Time: 08/29/14  9:28 AM  Result Value Ref Range   Pro B Natriuretic peptide (BNP) >70000.0 (H) 0 - 450 pg/mL  Troponin I     Status: Abnormal   Collection Time: 08/29/14  9:28 AM  Result Value Ref Range   Troponin I 0.31 (HH) <0.30 ng/mL    Comment: CRITICAL RESULT CALLED TO, READ BACK BY AND VERIFIED WITH: KUEADER,S AT 10:00AM ON 08/29/14 BY FESTERMAN,C        Due to the release kinetics of cTnI, a negative result within the first hours of the onset of symptoms does not rule out myocardial infarction with certainty. If myocardial infarction is still suspected, repeat the test at appropriate intervals.   CBC with Differential     Status: Abnormal   Collection Time: 08/29/14  9:28 AM  Result Value Ref Range   WBC 11.1 (H) 4.0 - 10.5 K/uL   RBC 3.14 (L) 4.22 - 5.81 MIL/uL   Hemoglobin 9.5 (L) 13.0 - 17.0 g/dL   HCT 31.0 (L) 39.0 - 52.0 %   MCV 98.7 78.0 - 100.0 fL   MCH 30.3 26.0 - 34.0 pg   MCHC 30.6 30.0 - 36.0 g/dL   RDW 18.2 (H) 11.5 - 15.5 %   Platelets 374 150 - 400 K/uL   Neutrophils Relative % 86 (H) 43 - 77 %   Neutro Abs 9.5 (H) 1.7 - 7.7 K/uL   Lymphocytes Relative 6 (L) 12 - 46 %   Lymphs Abs 0.7 0.7 - 4.0 K/uL   Monocytes Relative 7 3 - 12 %   Monocytes Absolute 0.8 0.1 - 1.0 K/uL   Eosinophils Relative 1 0 - 5 %   Eosinophils Absolute 0.1 0.0 - 0.7 K/uL   Basophils Relative 0 0 - 1 %   Basophils Absolute 0.0 0.0 - 0.1 K/uL    Radiological Exams on Admission: Dg Chest Port 1 View  08/29/2014   CLINICAL DATA:  Short of breath, dialysis  EXAM: PORTABLE CHEST - 1 VIEW  COMPARISON:  Radiograph 08/17/2014  FINDINGS: Stable enlarged cardiac silhouette. There is central venous pulmonary congestion. There is left basilar atelectasis with small effusion. There is mild increase airspace disease at the lung bases. No pneumothorax.   IMPRESSION: Central venous  congestion with increased mild pulmonary edema.  Left basilar atelectasis and small effusion.   Electronically Signed   By: Suzy Bouchard M.D.   On: 08/29/2014 10:02    Assessment/Plan Principal Problem:   Acute respiratory failure with hypoxia Active Problems:   Acute on chronic combined systolic and diastolic CHF (congestive heart failure)   End stage renal disease, has been peritoneal, now hemodialysis   Elevated troponin   Fluid overload   Acute respiratory failure with hypoxemia   Acute hypoxemic respiratory failure -Secondary to fluid overload in a patient with chronic combined congestive heart failure with a known ejection fraction of 30-35% as well as end-stage renal disease. -Unclear as to why he is currently fluid overloaded as he received a full dialysis session on Saturday, presume possibly related to dietary indiscretion.  -Continue BiPAP , may wean after dialysis if respirations improve.  End-stage renal disease -Nephrology consulted for dialysis.  Elevated troponin -Likely due to fluid overload, end-stage renal disease or demand ischemia, also may be simply trending down from previously elevated troponins about 7 days ago with a high of around 3.  DVT prophylaxis -Subcutaneous heparin  CODE STATUS -Full code   Time Spent on Admission: 75 minutes  HERNANDEZ ACOSTA,Nasrin Lanzo [facility] Pager: [phone] 08/29/2014, 5:22 PM

## 2014-08-29 NOTE — Plan of Care (Signed)
Problem: Consults Goal: Skin Care Protocol Initiated - if Braden Score 18 or less If consults are not indicated, leave blank or document N/A Outcome: Not Applicable Date Met:  18/33/58

## 2014-08-29 NOTE — ED Notes (Signed)
Sob since dialysis Saturday. C/o swelling in feet.  Denies any pain.

## 2014-08-29 NOTE — Plan of Care (Signed)
Problem: Phase I Progression Outcomes Goal: Pain controlled with appropriate interventions Outcome: Completed/Met Date Met:  08/29/14 Goal: Dyspnea controlled at rest Outcome: Completed/Met Date Met:  08/29/14 Goal: Skin without signs of pressure Outcome: Completed/Met Date Met:  08/29/14 Goal: Hemodynamically stable Outcome: Completed/Met Date Met:  08/29/14

## 2014-08-29 NOTE — ED Notes (Signed)
Dr Thurnell Garbe notified of troponin level of 0.31 Peyton Najjar RN notified as well.

## 2014-08-29 NOTE — ED Notes (Signed)
Pt used O2 2L [place] at home, O2 sats 85% on RA here, improved to 97% on  O2 2L [place]. Pt has swelling in both feet.

## 2014-08-29 NOTE — ED Notes (Signed)
Called to pt's room, pt's breathing labored on 2L, [place], called EDP, paged RT, placed pt on 15L partial non-rebreather until RT can place pt on bipap.

## 2014-08-29 NOTE — Consult Note (Signed)
CARDIOLOGY CONSULT NOTE   Patient ID: Randy [name] MRN: [id number] DOB/AGE: [birth year] 80 y.o.  Admit Date: 08/29/2014 Referring Physician: Lorriane Shire MD Primary Physician: [name], MD Consulting Cardiologist: Carlyle Dolly MD Primary Cardiologist : Satira Sark MD Reason for Consultation: CHF/Pulmonary Edema  Clinical Summary Randy [name] is a 80 y.o.male with known hx of ESRD, on home peritoneal dialysis, with recent hospitalization to [facility] where he was treated for acute myopericarditis. He had elevated troponin at that time and had cardiac cath "he was noted to have mild-moderate diffuse CAD. The most significant disease appeared to be 80% proximal stenoses in the moderate superior and inferior divisions of the ramus. However, theses lesions did not appear to be culprits for ACS (plaque rupture)." He was treated with ASA, colchicine and steroids and discharged on 08/23/2014.  He went to dialysis center on 08/27/2014 and his wife states that he did not feel better and breathing status worsened. He used his neb tx at home with no improvement. He asked to be brought to ER this am. We are asked to contribute from cardiac standpoint concerning recent admission for myopericarditis and possible repeat echo.   He has been placed on BIPAP and nephrology is seeing him emergently for HD.  He is hypertensive with BP 197/113. He denies chest pain. Main complaint is severe dyspnea. O2 sat on arrival to ER 93%. Now 100% on BIPAP.   Pro-BNP >7000. Creatinine 8.31. CXR Central venous congestion with increased mild pulmonary edema. He was treated with NTG 2% paste 1 inch and lasix 40 mg IV X 1.    No Known Allergies  Medications Scheduled Medications:    Infusions:    PRN Medications:      Past Medical History  Diagnosis Date  . Hypertensive heart disease   . Hypercholesterolemia   . Gout   . Secondary cardiomyopathy     LVEF 40-45%  . Anemia of chronic disease   .  Hypertension   . Arthritis   . Peripheral vascular disease   . ESRD on peritoneal dialysis   . On home oxygen therapy     "use what Randy need to" (08/17/2014)  . Pneumonia 09/2012  . Pericarditis 08/17/2014  . NSTEMI (non-ST elevated myocardial infarction) 08/17/2014    Past Surgical History  Procedure Laterality Date  . Knee arthroscopy Right 2007  . Back surgery    . Hemorrhoid surgery  1970's  . Ganglion cyst excision  01/03/2012    Procedure: REMOVAL GANGLION OF WRIST;  Surgeon: Scherry Ran, MD;  Location: [facility];  Service: General;  Laterality: Right;  . Colonoscopy    . Av fistula placement  08/24/2012    Procedure: ARTERIOVENOUS (AV) FISTULA CREATION;  Surgeon: Rosetta Posner, MD;  Location: [place];  Service: Vascular;  Laterality: Left;  . Insertion of dialysis catheter Right      neck  . Insertion of dialysis catheter  10/19/2012    Procedure: INSERTION OF DIALYSIS CATHETER;  Surgeon: Rosetta Posner, MD;  Location: [place];  Service: Vascular;  Laterality: N/A;  REMOVE TEMPORARY CATH  . Cardiac catheterization  08/17/2014  . Cholecystectomy    . Lumbar disc surgery  2004    Family History  Problem Relation Age of Onset  . Arthritis    . Cancer    . Kidney disease    . Anesthesia problems Neg Hx   . Hypotension Neg Hx   . Malignant hyperthermia Neg Hx   .  Pseudochol deficiency Neg Hx   . Cancer Sister   . Cancer Brother     colon  . Cancer Brother     colon    Social History Randy [name] reports that he quit smoking about 16 years ago. His smoking use included Cigarettes. He has a 30 pack-year smoking history. He quit smokeless tobacco use about 20 years ago. His smokeless tobacco use included Chew. Randy [name] reports that he does not drink alcohol.  Review of Systems Complete review of systems are found to be negative unless outlined in H&P [name].  Physical Examination Blood pressure 197/113, pulse 120, temperature 98.3 F (36.8 C), temperature source Oral, resp.  rate 27, height 5\' 9"  (1.753 m), weight 160 lb (72.576 kg), SpO2 100 %. No intake or output data in the 24 hours ending 08/29/14 1129   General: Well developed, well nourished, in no acute distress Head: Eyes PERRLA, No xanthomas.   Normal cephalic and atramatic  Lungs: Clear bilaterally to auscultation and percussion. Heart: HRRR S1 S2, without MRG.  Pulses are 2+ & equal.            No carotid bruit. No JVD.  No abdominal bruits. No femoral bruits. Abdomen: Bowel sounds are positive, abdomen soft and non-tender without masses or                  Hernia's noted. Msk:  Back normal, normal gait. Normal strength and tone for age. Extremities: No clubbing, cyanosis or edema.  DP +1 Neuro: Alert and oriented X 3. Psych:  Good affect, responds appropriately   Telemetry: Sinus tachycardia rates 115-120 bpm   GBT:[id number] distress on BIPAP HEENT: Conjunctiva and lids normal, oropharynx clear with moist mucosa. Neck: Supple, no elevated JVP or carotid bruits, no thyromegaly. Lungs: Diminished bibasilar to the middle lungs.  Cardiac: Regular rate and rhythm, tachycardic no S3 or significant systolic murmur, no pericardial rub. Abdomen: Soft, nontender, no hepatomegaly, bowel sounds present, no guarding or rebound. Extremities: 2+pitting edema, bilaterally, distal pulses 2+. Skin: Warm and dry. Musculoskeletal: No kyphosis. Neuropsychiatric: Alert and oriented x3, Anxious  Prior Cardiac Testing/Procedures 1. Cardiac Cath: 08/17/14 Procedural Findings: Hemodynamics: AO 129/76 Coronary angiography: Coronary dominance: right Left mainstem: Short, no significant disease.  Left anterior descending (LAD): There was a moderate D1 that branched early with 40% ostial stenosis. This was followed by a large septal perforator. There was 40-50% proximal LAD stenosis at the takeoff of the septal perforator. 30% mid LAD stenosis at D2.  Left circumflex (LCx): There was a large ramus  that branched early into a superior and and inferior division, both vessels were moderate in size. Both divisions of the ramus had relatively up to 80% proximal stenosis. The AV LCx itself had luminal irregularities.  Right coronary artery (RCA): There was an early large acute marginal with 40% proximal and 40% mid vessel stenosis. The RCA had diffuse luminal irregularities and up to 40% mid vessel stenosis.  Left ventriculography: Not done, echo was done earlier today.   Echocardiogram: 08/17/2014 Left ventricle: The cavity size was mildly dilated. Systolic function was moderately to severely reduced. The estimated ejection fraction was in the range of 30% to 35%. Diffuse hypokinesis. Features are consistent with a pseudonormal left ventricular filling pattern, with concomitant abnormal relaxation and increased filling pressure (grade 2 diastolic dysfunction). - Aortic valve: Mild to moderate aortic stenosis (mild by mean gradient, moderate by calculated valve area - may be low gradient moderate AS). Trileaflet; moderately calcified leaflets.  There was trivial regurgitation. Mean gradient (S): 13 mm Hg. Peak gradient (S): 30 mm Hg. Valve area (VTI): 1.37 cm^2. - Mitral valve: Mildly calcified annulus. There was mild regurgitation. - Left atrium: The atrium was moderately to severely dilated. - Right ventricle: The cavity size was normal. Systolic function was normal. - Right atrium: The atrium was mildly to moderately dilated. - Tricuspid valve: There was mild-moderate regurgitation. Peak RV-RA gradient (S): 75 mm Hg. - Pulmonary arteries: PA peak pressure: 83 mm Hg (S). - Systemic veins: IVC 2.2 cm with > 50% respirophasic variation, suggesting RA pressure 8 mmHg. - Pericardium, extracardiac: There was a left pleural effusion. Small circumferential pericardial effusion . The pericardium did appear thickened. Impressions: - Mildly dilated LV with EF  30-35%. DIffuse hypokinesis. Moderate diastolic dysfunction. Normal RV size and systolic function. Mild to moderate aortic stenosis (mild by mean gradient, moderate by calculated valve area). Biatrial enlargement. Severe pulmonary hypertension.  Lab Results  Basic Metabolic Panel:  Recent Labs Lab 08/22/14 1253 08/29/14 0928  NA 131* 141  K 4.0 4.2  CL 88* 96  CO2 21 28  GLUCOSE 143* 115*  BUN 66* 50*  CREATININE 9.06* 8.31*  CALCIUM 8.8 9.5  PHOS 4.9*  --     Liver Function Tests:  Recent Labs Lab 08/22/14 1253  ALBUMIN 2.2*    CBC:  Recent Labs Lab 08/22/14 1252 08/29/14 0928  WBC 7.8 11.1*  NEUTROABS  --  9.5*  HGB 7.9* 9.5*  HCT 25.6* 31.0*  MCV 93.1 98.7  PLT 321 374    Cardiac Enzymes:  Recent Labs Lab 08/29/14 0928  TROPONINI 0.31*    BNP: >7000   Radiology: Dg Chest Port 1 View  08/29/2014   CLINICAL DATA:  Short of breath, dialysis  EXAM: PORTABLE CHEST - 1 VIEW  COMPARISON:  Radiograph 08/17/2014  FINDINGS: Stable enlarged cardiac silhouette. There is central venous pulmonary congestion. There is left basilar atelectasis with small effusion. There is mild increase airspace disease at the lung bases. No pneumothorax.  IMPRESSION: Central venous congestion with increased mild pulmonary edema.  Left basilar atelectasis and small effusion.   Electronically Signed   By: Suzy Bouchard M.D.   On: 08/29/2014 10:02     ECG: Pending   Impression and Recommendations  1. Acute Pulmonary Edema with Respiratory Distress: Needs emergent dialysis. Currently on BIPAP with continued increased breathing effort. Last EF of 30-35% per echo in November of 2015. Elevated PAP pressures of 83 mm Hg. Grade II diastolic dysfunction.   2. Recent admission for myopericarditis: Troponin is 0.31 at present. May increase due to demand ischemia in the setting of acute respiratory distress. He is not complaining of any chest pain currently. Continue to cycle  troponin. Repeat EKG and echo.  3. CAD: Cardiac cath in Nov in 2015 with multivessel disease, non-obstructive, with Lx Ramus noted to have 80% stenosis at the bifurcation. Continued medical therapy with statin, BB (labetalol), and ASA.   4.NICM with Systolic Dysfunction: Most recent EF of 30-35%. Not sure if he is a candidate for ICD with multiple co-morbidities. Heart rate needs better control. Tachycardic is now related to acute respiratory distress and pulmonary edema. Will repeat EKG in AM.   5.. Hypertension: Likely related to acute fluid overload. Place back on po medications once he is has dialysis and is able to take po.  6. ESRD: Home peritoneal dialysis, with OP dialysis center on Sat. Management per Dr. Hinda Lenis with dialysis today.  Signed: [name] NP [place]  08/29/2014, 11:29 AM Co-Sign MD  Patient seen and discussed with NP Purcell Nails, Randy [name]. 80 yo male hx of ESRD, recent admission for myopericarditis, HL, combined chronic systolic and diastolic HF LVEF 39-03% and grade II diastolic dysfunction, mild to mod AS, pulmonary HTN, and CAD admitted with SOB. Recent admission with elevated troponin and chest pain, cath without clear culprit lesion, thought to be myopericarditis. Treated with high dose ASA, colchicine, and prednisone during admission according to notes. Presents today with 3 days of increasing SOB and LE edema.   - Echo 07/2014 LVEF 30-35%, diffuse hypokinesis, grade II diastolic dysfunction, mild to mod AS(mean grad 13, AVA 1.37), mod to severe LAE, PASP 83, small pericardial effusion with apparently thickened pericardium - 07/2014 cath LM patent, LAD 40-50%  D1 40%, LCX LIs, ramus 80%, RCA 40% mid. Managed medically. - WBC 11.1 Hgb 9.5 Plt 374 Trop 0.31 (down from 2.56 2 weeks ago), pro-BNP >70,000, K 4.2 Cr 8.31 BUN 50 -  CXR pulmonary edema, small left effusion - EKG sinus tach, LVH with chronic strain pattern   Patient  with acute on chronic combined systolic/diastolic HF, in setting of his ESRD fluid removal will be per HD which he is heading up soon. Exacerbating factor unclear, he reports compliance with his HD sessions. BP significantly elevatd, question home compliance with meds, in the past he has had dietary sodium noncompliance as well. Follow symptoms after HD, if persistently elevated blood pressures after HD would start nitro gtt with slow decrease of his MAP overnight. From cardiac standpoint will repeat echo to make sure in setting of recent myopericarditis his LV dysfunction or his pericardial effusion have not progressed. Will need to change labetalol to coreg tomorrow, consider ACE-Randy as well. Mildly elevated troponin likely due fluid overload, ESRD, or demand ischemia. Continue to trend, recent cath with medically managed disease.    Zandra Abts MD

## 2014-08-29 NOTE — Consult Note (Signed)
Reason for Consult: CHF and end-stage renal disease Referring Physician: [name] is an 80 y.o. male.  HPI: He is a patient with history of hypertension, end-stage renal disease initially on peritoneal dialysis. During his last admission patient was found to have elevated troponin and also acute myopericarditis. It was thought to be possibly from under dialysis.  However patient. Paternal dialysis adequacy before his admission was good and patient was asymptomatic. Patient was treated with hemodialysis and was put on aspirin and steroid. Came to the dialysis unit on Saturday and received dialysis. Since then patient stated that he was having difficulty in breathing. He also complains of some cough but no sputum production. Presently patient denies any chest pain. He has some orthopnea and paroxysmal nocturnal dyspnea. When he was evaluated patient was found to CHF hence consult is called for dialysis. Past Medical History  Diagnosis Date  . Hypertensive heart disease   . Hypercholesterolemia   . Gout   . Secondary cardiomyopathy     LVEF 40-45%  . Anemia of chronic disease   . Hypertension   . Arthritis   . Peripheral vascular disease   . ESRD on peritoneal dialysis   . On home oxygen therapy     "use what I need to" (08/17/2014)  . Pneumonia 09/2012  . Pericarditis 08/17/2014  . NSTEMI (non-ST elevated myocardial infarction) 08/17/2014    Past Surgical History  Procedure Laterality Date  . Knee arthroscopy Right 2007  . Back surgery    . Hemorrhoid surgery  1970's  . Ganglion cyst excision  01/03/2012    Procedure: REMOVAL GANGLION OF WRIST;  Surgeon: Scherry Ran, MD;  Location: [facility];  Service: General;  Laterality: Right;  . Colonoscopy    . Av fistula placement  08/24/2012    Procedure: ARTERIOVENOUS (AV) FISTULA CREATION;  Surgeon: Rosetta Posner, MD;  Location: [place];  Service: Vascular;  Laterality: Left;  . Insertion of dialysis catheter Right      neck   . Insertion of dialysis catheter  10/19/2012    Procedure: INSERTION OF DIALYSIS CATHETER;  Surgeon: Rosetta Posner, MD;  Location: [place];  Service: Vascular;  Laterality: N/A;  REMOVE TEMPORARY CATH  . Cardiac catheterization  08/17/2014  . Cholecystectomy    . Lumbar disc surgery  2004    Family History  Problem Relation Age of Onset  . Arthritis    . Cancer    . Kidney disease    . Anesthesia problems Neg Hx   . Hypotension Neg Hx   . Malignant hyperthermia Neg Hx   . Pseudochol deficiency Neg Hx   . Cancer Sister   . Cancer Brother     colon  . Cancer Brother     colon    Social History:  reports that he quit smoking about 16 years ago. His smoking use included Cigarettes. He has a 30 pack-year smoking history. He quit smokeless tobacco use about 20 years ago. His smokeless tobacco use included Chew. He reports that he does not drink alcohol or use illicit drugs.  Allergies: No Known Allergies  Medications: I have reviewed the patient's current medications.  Results for orders placed or performed during the hospital encounter of 08/29/14 (from the past 48 hour(s))  Basic metabolic panel     Status: Abnormal   Collection Time: 08/29/14  9:28 AM  Result Value Ref Range   Sodium 141 137 - 147 mEq/L   Potassium 4.2  3.7 - 5.3 mEq/L   Chloride 96 96 - 112 mEq/L   CO2 28 19 - 32 mEq/L   Glucose, Bld 115 (H) 70 - 99 mg/dL   BUN 50 (H) 6 - 23 mg/dL   Creatinine, Ser 8.31 (H) 0.50 - 1.35 mg/dL   Calcium 9.5 8.4 - 10.5 mg/dL   GFR calc non Af Amer 5 (L) >90 mL/min   GFR calc Af Amer 6 (L) >90 mL/min    Comment: (NOTE) The eGFR has been calculated using the CKD EPI equation. This calculation has not been validated in all clinical situations. eGFR's persistently <90 mL/min signify possible Chronic Kidney Disease.    Anion gap 17 (H) 5 - 15  Pro b natriuretic peptide (BNP)     Status: Abnormal   Collection Time: 08/29/14  9:28 AM  Result Value Ref Range   Pro B Natriuretic  peptide (BNP) >70000.0 (H) 0 - 450 pg/mL  Troponin I     Status: Abnormal   Collection Time: 08/29/14  9:28 AM  Result Value Ref Range   Troponin I 0.31 (HH) <0.30 ng/mL    Comment: CRITICAL RESULT CALLED TO, READ BACK BY AND VERIFIED WITH: KUEADER,S AT 10:00AM ON 08/29/14 BY FESTERMAN,C        Due to the release kinetics of cTnI, a negative result within the first hours of the onset of symptoms does not rule out myocardial infarction with certainty. If myocardial infarction is still suspected, repeat the test at appropriate intervals.   CBC with Differential     Status: Abnormal   Collection Time: 08/29/14  9:28 AM  Result Value Ref Range   WBC 11.1 (H) 4.0 - 10.5 K/uL   RBC 3.14 (L) 4.22 - 5.81 MIL/uL   Hemoglobin 9.5 (L) 13.0 - 17.0 g/dL   HCT 31.0 (L) 39.0 - 52.0 %   MCV 98.7 78.0 - 100.0 fL   MCH 30.3 26.0 - 34.0 pg   MCHC 30.6 30.0 - 36.0 g/dL   RDW 18.2 (H) 11.5 - 15.5 %   Platelets 374 150 - 400 K/uL   Neutrophils Relative % 86 (H) 43 - 77 %   Neutro Abs 9.5 (H) 1.7 - 7.7 K/uL   Lymphocytes Relative 6 (L) 12 - 46 %   Lymphs Abs 0.7 0.7 - 4.0 K/uL   Monocytes Relative 7 3 - 12 %   Monocytes Absolute 0.8 0.1 - 1.0 K/uL   Eosinophils Relative 1 0 - 5 %   Eosinophils Absolute 0.1 0.0 - 0.7 K/uL   Basophils Relative 0 0 - 1 %   Basophils Absolute 0.0 0.0 - 0.1 K/uL    Dg Chest Port 1 View  08/29/2014   CLINICAL DATA:  Short of breath, dialysis  EXAM: PORTABLE CHEST - 1 VIEW  COMPARISON:  Radiograph 08/17/2014  FINDINGS: Stable enlarged cardiac silhouette. There is central venous pulmonary congestion. There is left basilar atelectasis with small effusion. There is mild increase airspace disease at the lung bases. No pneumothorax.  IMPRESSION: Central venous congestion with increased mild pulmonary edema.  Left basilar atelectasis and small effusion.   Electronically Signed   By: Suzy Bouchard M.D.   On: 08/29/2014 10:02    Review of Systems  HENT: Positive for  congestion.   Respiratory: Positive for shortness of breath and wheezing. Negative for cough and sputum production.   Cardiovascular: Positive for orthopnea, leg swelling and PND. Negative for chest pain and palpitations.  Gastrointestinal: Negative for nausea, vomiting and  abdominal pain.  Neurological: Positive for weakness.   Blood pressure 194/106, pulse 115, temperature 98.3 F (36.8 C), temperature source Oral, resp. rate 31, height 5' 9"  (1.753 m), weight 72.576 kg (160 lb), SpO2 98 %. Physical Exam  Constitutional: He is oriented to person, place, and time. He appears distressed.  Eyes: No scleral icterus.  Neck: JVD present.  Cardiovascular: Normal rate and regular rhythm.   Murmur heard. Respiratory: He is in respiratory distress. He has wheezes. He has rales.  GI: He exhibits no distension. There is no tenderness. There is no rebound.  Musculoskeletal: He exhibits edema.  Neurological: He is alert and oriented to person, place, and time.    Assessment/Plan: Problem #1 CHF: Possibly from uncontrolled salt and fluid intake. At this moment his blood pressure is high hence flash pulmonary edema also is possible. Patient seems to be in respiratory distress and on BiPAP. Problem #2 end-stage renal disease: He is status post hemodialysis on Saturday presently patient doesn't have any nausea or vomiting. Problem #3 hypertension: His blood pressure is very high. Patient did not take his antihypertensive medication today. Problem #4 anemia: His hemoglobin and hematocrit is below target range. Problem #5 history of acute myopericarditis. Patient at this moment does not have any chest pain. Problem #6 metabolic bone disease: His calcium is in range Problem #7 elevated troponin: Seems to be somewhat better from his previous value when he was admitted for chest pain. Plan: We'll make arrangements for patient to get dialysis today and possibly try to get about 4 L if his blood pressure  tolerates. We'll use 4/2.5 calcium bath for 4 hours and 15  minutes. We'll check his basic metabolic panel, phosphorus and CBC in the morning. We'll evaluate his condition tomorrow and we'll make a decision about further dialysis.   Randy [name] S 08/29/2014, 11:50 AM

## 2014-08-29 NOTE — ED Provider Notes (Signed)
CSN: [id number]     Arrival date & time 08/29/14  0906 History   First MD Initiated Contact with Patient 08/29/14 [phone]     Chief Complaint  Patient presents with  . Shortness of Breath      HPI Pt was seen at 0915. Per pt and his wife, c/o gradual onset and worsening of persistent SOB and peripheral edema for the past 3 days. Has been associated with cough. Pt states his symptoms began before he went to his usual HD treatment on Saturday. Pt did not go to his HD treatment today. Denies wheezing, no abd pain, no N/V/D, no back pain, no fevers, no CP/palpitations. Pt was recently admitted to [facility] for dx CHF, elevated troponin, pericarditis (08/17/2014).    Past Medical History  Diagnosis Date  . Hypertensive heart disease   . Hypercholesterolemia   . Gout   . Secondary cardiomyopathy     LVEF 40-45%  . Anemia of chronic disease   . Hypertension   . Arthritis   . Peripheral vascular disease   . ESRD on peritoneal dialysis   . On home oxygen therapy     "use what I need to" (08/17/2014)  . Pneumonia 09/2012  . Pericarditis 08/17/2014  . NSTEMI (non-ST elevated myocardial infarction) 08/17/2014   Past Surgical History  Procedure Laterality Date  . Knee arthroscopy Right 2007  . Back surgery    . Hemorrhoid surgery  1970's  . Ganglion cyst excision  01/03/2012    Procedure: REMOVAL GANGLION OF WRIST;  Surgeon: Scherry Ran, MD;  Location: [facility];  Service: General;  Laterality: Right;  . Colonoscopy    . Av fistula placement  08/24/2012    Procedure: ARTERIOVENOUS (AV) FISTULA CREATION;  Surgeon: Rosetta Posner, MD;  Location: [place];  Service: Vascular;  Laterality: Left;  . Insertion of dialysis catheter Right      neck  . Insertion of dialysis catheter  10/19/2012    Procedure: INSERTION OF DIALYSIS CATHETER;  Surgeon: Rosetta Posner, MD;  Location: [place];  Service: Vascular;  Laterality: N/A;  REMOVE TEMPORARY CATH  . Cardiac catheterization  08/17/2014  . Cholecystectomy     . Lumbar disc surgery  2004   Family History  Problem Relation Age of Onset  . Arthritis    . Cancer    . Kidney disease    . Anesthesia problems Neg Hx   . Hypotension Neg Hx   . Malignant hyperthermia Neg Hx   . Pseudochol deficiency Neg Hx   . Cancer Sister   . Cancer Brother     colon  . Cancer Brother     colon   History  Substance Use Topics  . Smoking status: Former Smoker -- 1.00 packs/day for 30 years    Types: Cigarettes    Quit date: 08/19/1998  . Smokeless tobacco: Former Systems developer    Types: Chew    Quit date: 12/31/1993  . Alcohol Use: No    Review of Systems ROS: Statement: All systems negative except as marked or noted in the HPI; Constitutional: Negative for fever and chills. ; ; Eyes: Negative for eye pain, redness and discharge. ; ; ENMT: Negative for ear pain, hoarseness, nasal congestion, sinus pressure and sore throat. ; ; Cardiovascular: Negative for chest pain, palpitations, diaphoresis, +dyspnea and peripheral edema. ; ; Respiratory: +cough. Negative for wheezing and stridor. ; ; Gastrointestinal: Negative for nausea, vomiting, diarrhea, abdominal pain, blood in stool, hematemesis, jaundice and rectal bleeding. . ; ;  Genitourinary: Negative for dysuria, flank pain and hematuria. ; ; Musculoskeletal: Negative for back pain and neck pain. Negative for swelling and trauma.; ; Skin: Negative for pruritus, rash, abrasions, blisters, bruising and skin lesion.; ; Neuro: Negative for headache, lightheadedness and neck stiffness. Negative for weakness, altered level of consciousness , altered mental status, extremity weakness, paresthesias, involuntary movement, seizure and syncope.      Allergies  Review of patient's allergies indicates no known allergies.  Home Medications   Prior to Admission medications   Medication Sig Start Date End Date Taking? Authorizing Provider  pantoprazole (PROTONIX) 40 MG tablet Take 1 tablet (40 mg total) by mouth daily at 12 noon.  08/23/14  Yes Rhonda G Barrett, PA-C  rOPINIRole (REQUIP) 0.25 MG tablet Take 0.25-0.5 mg by mouth at bedtime.    Yes Historical Provider, MD  simvastatin (ZOCOR) 40 MG tablet Take 1 tablet (40 mg total) by mouth every Monday, Wednesday, and Friday. 08/23/14  Yes Rhonda G Barrett, PA-C  amLODipine (NORVASC) 10 MG tablet Take 1 tablet (10 mg total) by mouth daily. 06/23/14   [name], MD  aspirin 325 MG tablet Take 1 tablet (325 mg total) by mouth 2 (two) times daily. 08/23/14   Evelene Croon Barrett, PA-C  aspirin 81 MG EC tablet Take 1 tablet (81 mg total) by mouth daily. HOLD while on Aspirin 325 mg. 08/23/14   Evelene Croon Barrett, PA-C  calcitRIOL (ROCALTROL) 0.5 MCG capsule Take 1 capsule (0.5 mcg total) by mouth daily. 06/23/14   [name], MD  cloNIDine (CATAPRES) 0.1 MG tablet Take 1 tablet (0.1 mg total) by mouth 2 (two) times daily. 08/23/14   Rhonda G Barrett, PA-C  docusate sodium 100 MG CAPS Take 100 mg by mouth 2 (two) times daily. 08/23/14   Rhonda G Barrett, PA-C  hydrALAZINE (APRESOLINE) 25 MG tablet Take 1 tablet (25 mg total) by mouth 3 (three) times daily. 08/23/14   Rhonda G Barrett, PA-C  labetalol (NORMODYNE) 200 MG tablet Take 2 tablets (400 mg total) by mouth 2 (two) times daily. 07/17/12   Satira Sark, MD  multivitamin (RENA-VIT) TABS tablet Take 1 tablet by mouth daily.    Historical Provider, MD  polyethylene glycol (MIRALAX / GLYCOLAX) packet Take 17 g by mouth daily. 08/23/14   Rhonda G Barrett, PA-C  sevelamer carbonate (RENVELA) 800 MG tablet Take 2,400-4,000 mg by mouth 3 (three) times daily with meals. To take 5 with meals and 3 with snacks    Historical Provider, MD   BP 189/102 mmHg  Pulse 111  Temp(Src) 98.3 F (36.8 C) (Oral)  Resp 34  Ht 5\' 9"  (1.753 m)  Wt 160 lb (72.576 kg)  BMI 23.62 kg/m2  SpO2 92% Physical Exam  0920: Physical examination:  Nursing notes reviewed; Vital signs and O2 SAT reviewed;  Constitutional: Well developed, Well  nourished, Well hydrated, In no acute distress; Head:  Normocephalic, atraumatic; Eyes: EOMI, PERRL, No scleral icterus; ENMT: Mouth and pharynx normal, Mucous membranes moist; Neck: Supple, Full range of motion, No lymphadenopathy; Cardiovascular: Tachycardic rate and rhythm, No gallop.; Respiratory: Breath sounds coarse & equal bilaterally, No wheezes. Speaking long phrases, sitting upright, mildly tachypneic.; Chest: Nontender, Movement normal; Abdomen: Soft, Nontender, Nondistended, Normal bowel sounds; Genitourinary: No CVA tenderness; Extremities: Pulses normal, No tenderness, +2 pedal edema bilat without calf asymmetry.; Neuro: AA&Ox3, Major CN grossly intact.  Speech clear. No gross focal motor or sensory deficits in extremities.; Skin: Color normal, Warm, Dry.  ED Course  Procedures    EKG Interpretation None      MDM  MDM Reviewed: previous chart, nursing note and vitals Reviewed previous: labs and ECG Interpretation: labs, ECG and x-ray Total time providing critical care: 30-74 minutes. This excludes time spent performing separately reportable procedures and services. Consults: cardiology and admitting MD (Renal MD)   CRITICAL CARE Performed by: Alfonzo Feller Total critical care time: 45 Critical care time was exclusive of separately billable procedures and treating other patients. Critical care was necessary to treat or prevent imminent or life-threatening deterioration. Critical care was time spent personally by me on the following activities: development of treatment plan with patient and/or surrogate as well as nursing, discussions with consultants, evaluation of patient's response to treatment, examination of patient, obtaining history from patient or surrogate, ordering and performing treatments and interventions, ordering and review of laboratory studies, ordering and review of radiographic studies, pulse oximetry and re-evaluation of patient's condition.     Date:  08/29/2014  Rate: 102  Rhythm: sinus tachycardia and premature ventricular contractions (PVC)  QRS Axis: normal  Intervals: normal  ST/T Wave abnormalities: nonspecific ST/T changes  Conduction Disutrbances:none  Narrative Interpretation:   Old EKG Reviewed: unchanged; no significant changes from previous EKG dated 08/17/2014.  Results for orders placed or performed during the hospital encounter of 96/22/29  Basic metabolic panel  Result Value Ref Range   Sodium 141 137 - 147 mEq/L   Potassium 4.2 3.7 - 5.3 mEq/L   [place] 96 96 - 112 mEq/L   CO2 28 19 - 32 mEq/L   Glucose, Bld 115 (H) 70 - 99 mg/dL   BUN 50 (H) 6 - 23 mg/dL   Creatinine, Ser 8.31 (H) 0.50 - 1.35 mg/dL   Calcium 9.5 8.4 - 10.5 mg/dL   GFR calc non Af Amer 5 (L) >90 mL/min   GFR calc Af Amer 6 (L) >90 mL/min   Anion gap 17 (H) 5 - 15  Pro b natriuretic peptide (BNP)  Result Value Ref Range   Pro B Natriuretic peptide (BNP) >70000.0 (H) 0 - 450 pg/mL  Troponin I  Result Value Ref Range   Troponin I 0.31 (HH) <0.30 ng/mL  CBC with Differential  Result Value Ref Range   WBC 11.1 (H) 4.0 - 10.5 K/uL   RBC 3.14 (L) 4.22 - 5.81 MIL/uL   Hemoglobin 9.5 (L) 13.0 - 17.0 g/dL   HCT 31.0 (L) 39.0 - 52.0 %   MCV 98.7 78.0 - 100.0 fL   MCH 30.3 26.0 - 34.0 pg   MCHC 30.6 30.0 - 36.0 g/dL   RDW 18.2 (H) 11.5 - 15.5 %   Platelets 374 150 - 400 K/uL   Neutrophils Relative % 86 (H) 43 - 77 %   Neutro Abs 9.5 (H) 1.7 - 7.7 K/uL   Lymphocytes Relative 6 (L) 12 - 46 %   Lymphs Abs 0.7 0.7 - 4.0 K/uL   Monocytes Relative 7 3 - 12 %   Monocytes Absolute 0.8 0.1 - 1.0 K/uL   Eosinophils Relative 1 0 - 5 %   Eosinophils Absolute 0.1 0.0 - 0.7 K/uL   Basophils Relative 0 0 - 1 %   Basophils Absolute 0.0 0.0 - 0.1 K/uL   Dg Chest Port 1 View 08/29/2014   CLINICAL DATA:  Short of breath, dialysis  EXAM: PORTABLE CHEST - 1 VIEW  COMPARISON:  Radiograph 08/17/2014  FINDINGS: Stable enlarged cardiac silhouette. There is central  venous pulmonary congestion.  There is left basilar atelectasis with small effusion. There is mild increase airspace disease at the lung bases. No pneumothorax.  IMPRESSION: Central venous congestion with increased mild pulmonary edema.  Left basilar atelectasis and small effusion.   Electronically Signed   By: Suzy Bouchard M.D.   On: 08/29/2014 10:02    1040:  Troponin elevated but trending downward. BNP elevated, but c/w previous. BUN/Cr per baseline.  Pt c/o gradually increasing SOB since arrival to the ED, now sitting upright off side of stretcher. Will start Bipap, dose IV lasix, and topical ntg. Pt needs emergent HD treatment. T/C to [facility] Dr. Harl Bowie, case discussed, including:  HPI, pertinent PM/SHx, VS/PE, dx testing, ED course and treatment:  Agrees with ED treatment and that pt will need emergent HD, he will consult.    1120:  T/C to [facility] Dr. Jerilee Hoh, case discussed, including:  HPI, pertinent PM/SHx, VS/PE, dx testing, ED course and treatment:  Agreeable to admit, requests to write temporary orders, obtain ICU bed. T/C to [facility] Dr. Lowanda Foster, case discussed, including:  HPI, pertinent PM/SHx, VS/PE, dx testing, ED course and treatment:  Agreeable to consult for emergent HD.   1135:  Bipap on pt. Meds given. Pt now laying back on stretcher, appears more comfortable on bipap. Emergent HD pending. Pt to be transferred to ICU.       Francine Graven, DO 08/31/14 2313

## 2014-08-29 NOTE — Procedures (Signed)
   HEMODIALYSIS TREATMENT NOTE:  4.25 hour heparin-free emergent hemodialysis tx completed via left forearm AVF (15g/antegrade).  Goal met:  Tolerated removal of 4 liters without interruption in ultrafiltration.  Weaned from BiPAP to 2L O2 via [place]. All blood was reinfused and hemostasis was achieved within 15 minutes.  Report called to Jadene Pierini, RN.  Alanya Vukelich L. Rossanna Spitzley, RN, CDN

## 2014-08-29 NOTE — ED Notes (Signed)
Hospitalist at bedside at this time 

## 2014-08-29 NOTE — ED Notes (Signed)
MD at bedside. 

## 2014-08-30 DIAGNOSIS — I359 Nonrheumatic aortic valve disorder, unspecified: Secondary | ICD-10-CM

## 2014-08-30 LAB — CBC
HEMATOCRIT: 27.1 % — AB (ref 39.0–52.0)
HEMOGLOBIN: 8.2 g/dL — AB (ref 13.0–17.0)
MCH: 29.9 pg (ref 26.0–34.0)
MCHC: 30.3 g/dL (ref 30.0–36.0)
MCV: 98.9 fL (ref 78.0–100.0)
Platelets: 285 10*3/uL (ref 150–400)
RBC: 2.74 MIL/uL — ABNORMAL LOW (ref 4.22–5.81)
RDW: 18.1 % — ABNORMAL HIGH (ref 11.5–15.5)
WBC: 7.5 10*3/uL (ref 4.0–10.5)

## 2014-08-30 LAB — BASIC METABOLIC PANEL
ANION GAP: 13 (ref 5–15)
BUN: 30 mg/dL — AB (ref 6–23)
CHLORIDE: 100 meq/L (ref 96–112)
CO2: 29 mEq/L (ref 19–32)
CREATININE: 5.53 mg/dL — AB (ref 0.50–1.35)
Calcium: 9 mg/dL (ref 8.4–10.5)
GFR calc Af Amer: 10 mL/min — ABNORMAL LOW (ref >90)
GFR calc non Af Amer: 9 mL/min — ABNORMAL LOW (ref >90)
Glucose, Bld: 110 mg/dL — ABNORMAL HIGH (ref 70–99)
Potassium: 4.2 mEq/L (ref 3.7–5.3)
Sodium: 142 mEq/L (ref 137–147)

## 2014-08-30 LAB — TROPONIN I
TROPONIN I: 0.68 ng/mL — AB (ref <0.30)
TROPONIN I: 0.84 ng/mL — AB (ref <0.30)
TROPONIN I: 0.85 ng/mL — AB (ref <0.30)

## 2014-08-30 IMAGING — CR DG CHEST 1V PORT
1 series · 1 of 1 positions shown · non-contrast
Comparison: Portable exam 7492 hours compared to 08/19/2012

CLINICAL DATA: Shortness of breath, history hypertension, CHF,
chronic kidney disease

PORTABLE CHEST - 1 VIEW

[view not recorded]
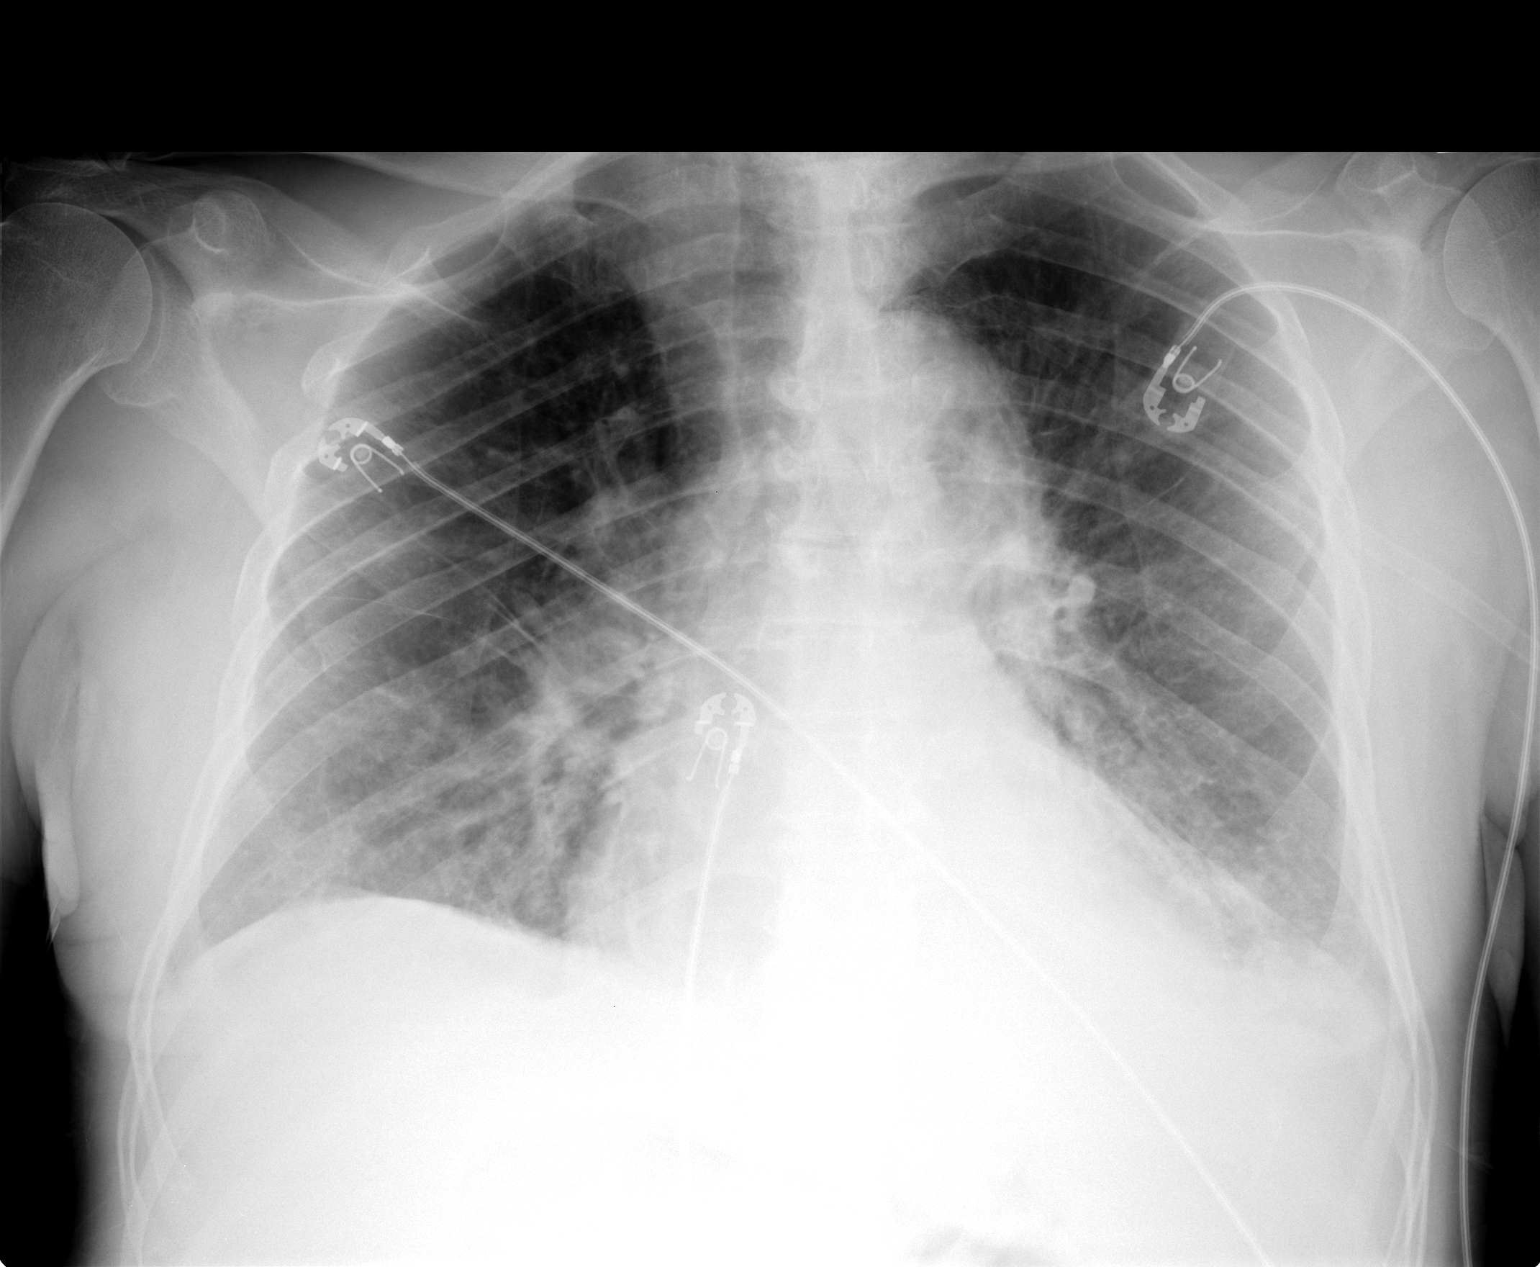

[1 of 1 positions shown; findings below may reference images not displayed]

FINDINGS: Enlargement of cardiac silhouette.
Calcified tortuous aorta.
Pulmonary vascularity normal.
Left lower lobe infiltrate question pneumonia.
Minimal infiltrate or atelectasis at right base.
Upper lungs clear.
Cannot exclude small left pleural effusion.
No pneumothorax.
Bones demineralized.
IMPRESSION: Enlargement of cardiac silhouette.
Left lower lobe pneumonia with mild atelectasis versus infiltrate
right base.
Question small left pleural effusion.

## 2014-08-30 MED ORDER — LISINOPRIL 5 MG PO TABS
5.0000 mg | ORAL_TABLET | Freq: Every day | ORAL | Status: DC
  Administered 2014-08-30 – 2014-08-31 (×2): 5 mg via ORAL
  Filled 2014-08-30 (×3): qty 1

## 2014-08-30 MED ORDER — SODIUM CHLORIDE 0.9 % IJ SOLN
INTRAMUSCULAR | Status: AC
  Administered 2014-08-30: 10 mL via INTRAVENOUS
  Filled 2014-08-30: qty 6

## 2014-08-30 MED ORDER — ATORVASTATIN CALCIUM 40 MG PO TABS
80.0000 mg | ORAL_TABLET | Freq: Every day | ORAL | Status: DC
  Administered 2014-08-30 – 2014-09-01 (×3): 80 mg via ORAL
  Filled 2014-08-30 (×3): qty 2

## 2014-08-30 MED ORDER — SODIUM CHLORIDE 0.9 % IJ SOLN
10.0000 mL | INTRAMUSCULAR | Status: DC | PRN
  Administered 2014-08-30 – 2014-09-01 (×3): 10 mL via INTRAVENOUS
  Filled 2014-08-30 (×3): qty 10

## 2014-08-30 MED ORDER — SODIUM CHLORIDE 0.9 % IJ SOLN
10.0000 mL | INTRAMUSCULAR | Status: DC | PRN
  Administered 2014-08-30 – 2014-09-01 (×2): 10 mL via INTRAVENOUS
  Filled 2014-08-30 (×2): qty 10

## 2014-08-30 MED ORDER — CLONIDINE HCL 0.1 MG PO TABS
0.1000 mg | ORAL_TABLET | Freq: Every day | ORAL | Status: AC
  Administered 2014-08-30 – 2014-09-01 (×3): 0.1 mg via ORAL
  Filled 2014-08-30 (×3): qty 1

## 2014-08-30 MED ORDER — CARVEDILOL 12.5 MG PO TABS
25.0000 mg | ORAL_TABLET | Freq: Two times a day (BID) | ORAL | Status: DC
  Administered 2014-08-30 – 2014-09-02 (×6): 25 mg via ORAL
  Filled 2014-08-30 (×6): qty 2

## 2014-08-30 NOTE — Care Management Utilization Note (Signed)
UR complete 

## 2014-08-30 NOTE — Progress Notes (Signed)
  Echocardiogram 2D Echocardiogram limited has been performed.  [place], [place] 08/30/2014, 9:43 AM

## 2014-08-30 NOTE — Progress Notes (Addendum)
Randy [name]  MRN: [id number]  DOB/AGE: 78-Aug-1933 78 y.o.  Primary Care 51 M, MD  Admit date: 08/29/2014  Chief Complaint:  Chief Complaint  Patient presents with  . Shortness of Breath    S-Pt presented on  08/29/2014 with  Chief Complaint  Patient presents with  . Shortness of Breath  .    Pt today feels better      Meds . aspirin  325 mg Oral BID  . atorvastatin  80 mg Oral q1800  . calcitRIOL  0.5 mcg Oral Daily  . carvedilol  25 mg Oral BID WC  . cloNIDine  0.1 mg Oral BID  . docusate sodium  100 mg Oral BID  . heparin  5,000 Units Subcutaneous 3 times per day  . hydrALAZINE  25 mg Oral TID  . lisinopril  5 mg Oral Daily  . multivitamin  1 tablet Oral Daily  . pantoprazole  40 mg Oral Q1200  . polyethylene glycol  17 g Oral Daily  . rOPINIRole  0.25-0.5 mg Oral QHS  . sevelamer carbonate  4,000 mg Oral TID WC  . sodium chloride  3 mL Intravenous Q12H       Physical Exam: Vital signs in last 24 hours: Temp:  [97.6 F (36.4 C)-98.8 F (37.1 C)] 98.1 F (36.7 C) (12/08 0400) Pulse Rate:  [72-124] 76 (12/08 0700) Resp:  [10-34] 13 (12/08 0700) BP: (99-199)/(58-116) 134/72 mmHg (12/08 0700) SpO2:  [87 %-100 %] 97 % (12/08 0700) Weight:  [160 lb (72.576 kg)-164 lb 14.5 oz (74.8 kg)] 164 lb 14.5 oz (74.8 kg) (12/08 0500) Weight change:  Last BM Date: 08/29/14  Intake/Output from previous day: 12/07 0701 - 12/08 0700 In: 240 [P.O.:240] Out: 4030 [Urine:30]     Physical Exam: General- pt is awake,alert, oriented to time place and person  Resp- NO REsp distress,  Decreased BS at bases  CVS- S1S2 regular in rate and rhythm  GIT- BS+, soft, NT, ND,PD cath in situ EXT- 1+ LE Edema, No Cyanosis  Access- AVF+      Lab Results: CBC  Recent Labs  08/29/14 0928 08/30/14 0515  WBC 11.1* 7.5  HGB 9.5* 8.2*  HCT 31.0* 27.1*  PLT 374 285    BMET  Recent Labs  08/29/14 0928 08/30/14 0515  NA 141 142  K 4.2 4.2  CL  96 100  CO2 28 29  GLUCOSE 115* 110*  BUN 50* 30*  CREATININE 8.31* 5.53*  CALCIUM 9.5 9.0    MICRO Recent Results (from the past 240 hour(s))  MRSA PCR Screening     Status: None   Collection Time: 08/29/14  6:36 PM  Result Value Ref Range Status   MRSA by PCR NEGATIVE NEGATIVE Final    Comment:        The GeneXpert MRSA Assay (FDA approved for NASAL specimens only), is one component of a comprehensive MRSA colonization surveillance program. It is not intended to diagnose MRSA infection nor to guide or monitor treatment for MRSA infections.       Lab Results  Component Value Date   PTH 559.0* 06/24/2012   CALCIUM 9.0 08/30/2014   PHOS 4.9* 08/22/2014            Impression: 1)Renal ESRD on HD  Pt transitioned from PD to HD in recent past. Now on TTS schedule as outpt Pt last dialyzed yesterday .  2)HTN BP n at goal  Sec to compliance issues  Medication  On Calcium Channel  Blockers  On Alpha and beta Blockers  On Vasodilators-  On Central Acting Sympatholytics-   3)Anemia HGb at goal (9--11)   on Epo   4)CKD Mineral-Bone Disorder  PTH acceptable .  Secondary Hyperparathyroidism present.  Phosphorus at goal.   5)CHF- admitted with acute exacerbation  Fluid overload   Now much better  6)Electrolytes  Normokalemic  NOrmonatremic   7)Acid base  Co2 at goal    Plan:  Will dialyze today. Will  Dialyze for shorter duration as had hd yesterday Will also use lower blood flow rates as trops on higher side.    BHUTANI,MANPREET S 08/30/2014, 8:46 AM

## 2014-08-30 NOTE — Progress Notes (Signed)
   Error. Patient hospitalized

## 2014-08-30 NOTE — Progress Notes (Signed)
Randy [name] YBO:[id number] DOB: Feb 03, 1932 DOA: 08/29/2014 PCP: [name], MD             Physical Exam: Blood pressure 131/77, pulse 84, temperature 98.8 F (37.1 C), temperature source Oral, resp. rate 17, height 5\' 9"  (1.753 m), weight 164 lb 14.5 oz (74.8 kg), SpO2 97 %. neck no JVD or carotid bruits lungs diminished breath sounds in bases prolonged atrial flutter with a scattered rhonchi no rales audible heart regular rhythm no S3 auscultated no heaves or rubs abdomen soft nontender bowel sounds normoactive  Investigations:  Recent Results (from the past 240 hour(s))  MRSA PCR Screening     Status: None   Collection Time: 08/29/14  6:36 PM  Result Value Ref Range Status   MRSA by PCR NEGATIVE NEGATIVE Final    Comment:        The GeneXpert MRSA Assay (FDA approved for NASAL specimens only), is one component of a comprehensive MRSA colonization surveillance program. It is not intended to diagnose MRSA infection nor to guide or monitor treatment for MRSA infections.      Basic Metabolic Panel:  Recent Labs  08/29/14 0928 08/30/14 0515  NA 141 142  K 4.2 4.2  CL 96 100  CO2 28 29  GLUCOSE 115* 110*  BUN 50* 30*  CREATININE 8.31* 5.53*  CALCIUM 9.5 9.0   Liver Function Tests: No results for input(s): AST, ALT, ALKPHOS, BILITOT, PROT, ALBUMIN in the last 72 hours.   CBC:  Recent Labs  08/29/14 0928 08/30/14 0515  WBC 11.1* 7.5  NEUTROABS 9.5*  --   HGB 9.5* 8.2*  HCT 31.0* 27.1*  MCV 98.7 98.9  PLT 374 285    Dg Chest Port 1 View  08/29/2014   CLINICAL DATA:  Short of breath, dialysis  EXAM: PORTABLE CHEST - 1 VIEW  COMPARISON:  Radiograph 08/17/2014  FINDINGS: Stable enlarged cardiac silhouette. There is central venous pulmonary congestion. There is left basilar atelectasis with small effusion. There is mild increase airspace disease at the lung bases. No pneumothorax.  IMPRESSION: Central venous congestion with increased mild  pulmonary edema.  Left basilar atelectasis and small effusion.   Electronically Signed   By: Suzy Bouchard M.D.   On: 08/29/2014 10:02      Medications:   Impression:  Principal Problem:   Acute respiratory failure with hypoxia Active Problems:   End stage renal disease, has been peritoneal, now hemodialysis   Elevated troponin   Fluid overload   Acute on chronic combined systolic and diastolic CHF (congestive heart failure)   Acute respiratory failure with hypoxemia     Plan: Alternate antihypertensive regimen for to include hydralazine increasing dosages nitrates and ACE inhibitor for secondary CHF benefit   Consultants: Cardiology and nephrology  Procedures dialysis today   Antibiotics:                   Code Status: Full  Family Communication:    Disposition Plan await results of this morning's echocardiogram to assess ventricular function continue troponins and dialysis  Time spent: 30 minutes   LOS: 1 day   Statia Burdick M   08/30/2014, 1:06 PM

## 2014-08-30 NOTE — Progress Notes (Signed)
Patient's troponin continues to go up, the current one is 0.84. Pt denies any chest pain. EKG was done. MD was notified. No new order received.

## 2014-08-30 NOTE — Progress Notes (Addendum)
Patient ID: Randy [name], male   DOB: 1932-06-27, 80 y.o.   MRN: [id number]      Subjective:   SOB improved but not resolved.   Objective:   Temp:  [97.6 F (36.4 C)-98.8 F (37.1 C)] 98.1 F (36.7 C) (12/08 0400) Pulse Rate:  [72-124] 76 (12/08 0700) Resp:  [10-34] 13 (12/08 0700) BP: (99-199)/(58-116) 134/72 mmHg (12/08 0700) SpO2:  [87 %-100 %] 97 % (12/08 0700) Weight:  [160 lb (72.576 kg)-164 lb 14.5 oz (74.8 kg)] 164 lb 14.5 oz (74.8 kg) (12/08 0500) Last BM Date: 08/29/14  Filed Weights   08/29/14 0909 08/30/14 0500  Weight: 160 lb (72.576 kg) 164 lb 14.5 oz (74.8 kg)    Intake/Output Summary (Last 24 hours) at 08/30/14 4818 Last data filed at 08/30/14 0300  Gross per 24 hour  Intake    240 ml  Output   4030 ml  Net  -3790 ml    Telemetry: NSR and sinus tach  Exam:  General: NAD  Resp: CTAB  Cardiac: RRR, no m/r/g, no JVD  GI: abdomen soft, NT, ND  MSK: no LE edema  Neuro: no focal deficits    Lab Results:  Basic Metabolic Panel:  Recent Labs Lab 08/29/14 0928 08/30/14 0515  NA 141 142  K 4.2 4.2  CL 96 100  CO2 28 29  GLUCOSE 115* 110*  BUN 50* 30*  CREATININE 8.31* 5.53*  CALCIUM 9.5 9.0    Liver Function Tests: No results for input(s): AST, ALT, ALKPHOS, BILITOT, PROT, ALBUMIN in the last 168 hours.  CBC:  Recent Labs Lab 08/29/14 0928 08/30/14 0515  WBC 11.1* 7.5  HGB 9.5* 8.2*  HCT 31.0* 27.1*  MCV 98.7 98.9  PLT 374 285    Cardiac Enzymes:  Recent Labs Lab 08/29/14 1851 08/29/14 2351 08/30/14 0544  TROPONINI 0.42* 0.84* 0.85*    BNP:  Recent Labs  06/21/14 1046 08/17/14 0625 08/29/14 0928  PROBNP >70000.0* >70000.0* >70000.0*    Coagulation: No results for input(s): INR in the last 168 hours.  ECG:   Medications:   Scheduled Medications: . amLODipine  10 mg Oral Daily  . aspirin  325 mg Oral BID  . atorvastatin  40 mg Oral q1800  . calcitRIOL  0.5 mcg Oral Daily  . cloNIDine  0.1 mg Oral  BID  . docusate sodium  100 mg Oral BID  . heparin  5,000 Units Subcutaneous 3 times per day  . hydrALAZINE  25 mg Oral TID  . labetalol  400 mg Oral BID  . multivitamin  1 tablet Oral Daily  . pantoprazole  40 mg Oral Q1200  . polyethylene glycol  17 g Oral Daily  . rOPINIRole  0.25-0.5 mg Oral QHS  . sevelamer carbonate  4,000 mg Oral TID WC  . sodium chloride  3 mL Intravenous Q12H     Infusions:     PRN Medications:  sodium chloride, sodium chloride, sodium chloride, acetaminophen **OR** acetaminophen, feeding supplement (NEPRO CARB STEADY), heparin, lidocaine (PF), lidocaine-prilocaine, morphine injection, ondansetron **OR** ondansetron (ZOFRAN) IV, pentafluoroprop-tetrafluoroeth, sevelamer carbonate, sodium chloride     Assessment/Plan    1. Acute on chronic combined systolic and diastolic HF - patient with ESRD, dialyzed yesterday. Makes little urine - continue fluid removal per renal with HD - change labetalol to coreg 25mg  bid, start lisinopril 5 mg daily.    2. HTN - significantly elevated bp's on admission, after HD bp much improved. Change norvasc to lisionpril for secondary  benefits given his systolic HF. Wean clonidine, he has room with other bp agents that will also be beneficial for his systolic HF (ACE, hydral/nitrates). Change clonidine to 0.1mg  qday x 3 days then stop.  3. ESRD - HD per renal  4. Elevated troponin/CAD - mild troponin elevation likely demand ischemia given volume overload and respiratory distress, repeat trop later this afternoon to establish peak. Recent cath with disease but no culprit lesions, his CAD has been medically managed. - given known CAD will increase his lipitor to 80mg  daily - recent admission with periomyocarditis, f/u repeat echo, continue bid ASA for now.     Carlyle Dolly, M.D.

## 2014-08-30 NOTE — Procedures (Signed)
   HEMODIALYSIS TREATMENT NOTE:  3.5 hour heparin-free dialysis completed via left forearm AVF (16g/antegrade).  Goal met:  Tolerated removal of 3.2 liters without interruption in ultrafiltration.  All blood was reinfused and hemostasis was achieved within 15 minutes.  Report given to Penni Homans, RN.  Tadao Emig L. Ranette Luckadoo, RN, CDN

## 2014-08-31 ENCOUNTER — Encounter: Payer: Medicare Other | Admitting: Adult Health

## 2014-08-31 MED ORDER — HYDRALAZINE HCL 25 MG PO TABS
50.0000 mg | ORAL_TABLET | Freq: Three times a day (TID) | ORAL | Status: DC
  Administered 2014-08-31 – 2014-09-02 (×3): 50 mg via ORAL
  Filled 2014-08-31 (×5): qty 2

## 2014-08-31 NOTE — Progress Notes (Signed)
Patient ID: Randy [name], male   DOB: April 26, 1932, 80 y.o.   MRN: [id number]      Subjective:    SOB improving  Objective:   Temp:  [98.1 F (36.7 C)-98.8 F (37.1 C)] 98.5 F (36.9 C) (12/09 0759) Pulse Rate:  [76-103] 86 (12/09 0700) Resp:  [13-26] 17 (12/09 0700) BP: (119-171)/(49-143) 165/70 mmHg (12/09 0700) SpO2:  [95 %-100 %] 96 % (12/09 0700) Weight:  [159 lb 13.3 oz (72.5 kg)-167 lb 1.7 oz (75.8 kg)] 163 lb 5.8 oz (74.1 kg) (12/09 0500) Last BM Date: 08/29/14  Filed Weights   08/30/14 1245 08/30/14 1645 08/31/14 0500  Weight: 167 lb 1.7 oz (75.8 kg) 159 lb 13.3 oz (72.5 kg) 163 lb 5.8 oz (74.1 kg)    Intake/Output Summary (Last 24 hours) at 08/31/14 0831 Last data filed at 08/30/14 1625  Gross per 24 hour  Intake      0 ml  Output   3230 ml  Net  -3230 ml    Telemetry: NSR  Exam:  General: NAD  Resp: CTAB  Cardiac: RRR, 2/6 systolic murmur at apex, no JVD  NF:AOZHYQM soft, NT, ND  MSK: trace bilateral edema  Neuro: no focal deficits   Lab Results:  Basic Metabolic Panel:  Recent Labs Lab 08/29/14 0928 08/30/14 0515  NA 141 142  K 4.2 4.2  CL 96 100  CO2 28 29  GLUCOSE 115* 110*  BUN 50* 30*  CREATININE 8.31* 5.53*  CALCIUM 9.5 9.0    Liver Function Tests: No results for input(s): AST, ALT, ALKPHOS, BILITOT, PROT, ALBUMIN in the last 168 hours.  CBC:  Recent Labs Lab 08/29/14 0928 08/30/14 0515  WBC 11.1* 7.5  HGB 9.5* 8.2*  HCT 31.0* 27.1*  MCV 98.7 98.9  PLT 374 285    Cardiac Enzymes:  Recent Labs Lab 08/29/14 2351 08/30/14 0544 08/30/14 1340  TROPONINI 0.84* 0.85* 0.68*    BNP:  Recent Labs  06/21/14 1046 08/17/14 0625 08/29/14 0928  PROBNP >70000.0* >70000.0* >70000.0*    Coagulation: No results for input(s): INR in the last 168 hours.  ECG:   Medications:   Scheduled Medications: . aspirin  325 mg Oral BID  . atorvastatin  80 mg Oral q1800  . calcitRIOL  0.5 mcg Oral Daily  . carvedilol   25 mg Oral BID WC  . cloNIDine  0.1 mg Oral Daily  . docusate sodium  100 mg Oral BID  . heparin  5,000 Units Subcutaneous 3 times per day  . hydrALAZINE  50 mg Oral Q8H  . lisinopril  5 mg Oral Daily  . multivitamin  1 tablet Oral Daily  . pantoprazole  40 mg Oral Q1200  . polyethylene glycol  17 g Oral Daily  . rOPINIRole  0.25-0.5 mg Oral QHS  . sevelamer carbonate  4,000 mg Oral TID WC  . sodium chloride  3 mL Intravenous Q12H     Infusions:     PRN Medications:  sodium chloride, sodium chloride, sodium chloride, acetaminophen **OR** acetaminophen, feeding supplement (NEPRO CARB STEADY), heparin, lidocaine (PF), lidocaine-prilocaine, morphine injection, ondansetron **OR** ondansetron (ZOFRAN) IV, pentafluoroprop-tetrafluoroeth, sevelamer carbonate, sodium chloride, sodium chloride, sodium chloride     Assessment/Plan    1. Acute on chronic combined systolic and diastolic HF - patient with ESRD, symptoms and volume status have improved with HD during admission - continue current meds, pending blood pressure may titrate ACE-I further.    2. HTN - significantly elevated bp's on admission, after  HD bp much improved. Changed norvasc to lisionpril for secondary benefits given his systolic HF. Weaning clonidine, he has room with other bp agents that will also be beneficial for his systolic HF (ACE, hydral/nitrates). Change clonidine to 0.1mg  qday x 2 more days then stop.  3. ESRD - HD per renal  4. Elevated troponin/CAD - mild troponin elevation likely demand ischemia given volume overload and respiratory distress, peak trop 0.85 Recent cath with disease but no culprit lesions, his CAD has been medically managed. - recent admission with periomyocarditis, f/u repeat echo, continue bid ASA for now, would like change back to once a day ASA at the end of the month   Zandra Abts MD        Carlyle Dolly, M.D.

## 2014-08-31 NOTE — Care Management Note (Addendum)
    Page 1 of 1   09/02/2014     12:45:01 PM CARE MANAGEMENT NOTE 09/02/2014  Patient:  Randy [name], Randy [name]   Account Number:  [id number]  Date Initiated:  08/31/2014  Documentation initiated by:  CHILDRESS,JESSICA  Subjective/Objective Assessment:   Pt is from home, lives with wife and is independent with ADL's. Pt is on HD, at [facility] on T/R/Sa. Pt drives himself to dailysis. Pt has no HH serivces, DME's or med needs prior to admission.     Action/Plan:   Pt plans to discharge home with self care. No CM needs identified at this time.   Anticipated DC Date:  09/02/2014   Anticipated DC Plan:  [place]  CM consult      Choice offered to / List presented to:             Status of service:  Completed, signed off Medicare Important Message given?  YES (If response is "NO", the following Medicare IM given date fields will be blank) Date Medicare IM given:  09/02/2014 Medicare IM given by:  Vladimir Creeks Date Additional Medicare IM given:   Additional Medicare IM given by:    Discharge Disposition:  HOME/SELF CARE  Per UR Regulation:    If discussed at Long Length of Stay Meetings, dates discussed:    Comments:  08/31/2014 [place], RN, MSN, [facility]

## 2014-08-31 NOTE — Progress Notes (Signed)
Patient sitting in bed comfortable no significant orthopnea present systolic still 182/99 Randy [name] BZJ:[id number] DOB: October 30, 1931 DOA: 08/29/2014 PCP: Randy Curet, MD             Physical Exam: Blood pressure 144/68, pulse 76, temperature 98.1 F (36.7 C), temperature source Oral, resp. rate 21, height 5\' 9"  (1.753 m), weight 163 lb 5.8 oz (74.1 kg), SpO2 99 %. no JVD no carotid bruits no thyromegaly lungs diminished breath sounds in the bases prolonged phase scattered rhonchi no rales appreciable heart regular rhythm no S3-S4 auscultated no heaves rubs abdomen soft nontender bowel sounds normoactive   Investigations:  Recent Results (from the past 240 hour(s))  MRSA PCR Screening     Status: None   Collection Time: 08/29/14  6:36 PM  Result Value Ref Range Status   MRSA by PCR NEGATIVE NEGATIVE Final    Comment:        The GeneXpert MRSA Assay (FDA approved for NASAL specimens only), is one component of a comprehensive MRSA colonization surveillance program. It is not intended to diagnose MRSA infection nor to guide or monitor treatment for MRSA infections.      Basic Metabolic Panel:  Recent Labs  08/29/14 0928 08/30/14 0515  NA 141 142  K 4.2 4.2  CL 96 100  CO2 28 29  GLUCOSE 115* 110*  BUN 50* 30*  CREATININE 8.31* 5.53*  CALCIUM 9.5 9.0   Liver Function Tests: No results for input(s): AST, ALT, ALKPHOS, BILITOT, PROT, ALBUMIN in the last 72 hours.   CBC:  Recent Labs  08/29/14 0928 08/30/14 0515  WBC 11.1* 7.5  NEUTROABS 9.5*  --   HGB 9.5* 8.2*  HCT 31.0* 27.1*  MCV 98.7 98.9  PLT 374 285    Dg Chest Port 1 View  08/29/2014   CLINICAL DATA:  Short of breath, dialysis  EXAM: PORTABLE CHEST - 1 VIEW  COMPARISON:  Radiograph 08/17/2014  FINDINGS: Stable enlarged cardiac silhouette. There is central venous pulmonary congestion. There is left basilar atelectasis with small effusion. There is mild increase airspace disease at the  lung bases. No pneumothorax.  IMPRESSION: Central venous congestion with increased mild pulmonary edema.  Left basilar atelectasis and small effusion.   Electronically Signed   By: Randy [name] M.D.   On: 08/29/2014 10:02      Medications:   Impression:  Principal Problem:   Acute respiratory failure with hypoxia Active Problems:   End stage renal disease, has been peritoneal, now hemodialysis   Elevated troponin   Fluid overload   Acute on chronic combined systolic and diastolic CHF (congestive heart failure)   Acute respiratory failure with hypoxemia     Plan: Increase hydralazine to 50 mg by mouth every 8 hours as per record days of cardiology  Consultants: Nephrology and cardiology    Procedures dialysis yesterday presumably tomorrow   Antibiotics:                   Code Status: Full  Family Communication:    Disposition Plan increase hydralazine 50 by mouth 3 times a day every 8 hours today monitor renal function resumed dialysis presumably tomorrow  Time spent: 30 minutes   LOS: 2 days   Randy [name] M   08/31/2014, 7:09 AM

## 2014-08-31 NOTE — Progress Notes (Signed)
Randy [name]  MRN: [id number]  DOB/AGE: Oct 20, 1931 80 y.o.  Primary [facility] 43 M, MD  Admit date: 08/29/2014  Chief Complaint:  Chief Complaint  Patient presents with  . Shortness of Breath    S-Pt presented on  08/29/2014 with  Chief Complaint  Patient presents with  . Shortness of Breath  .    Pt says " I am much better".      Meds . aspirin  325 mg Oral BID  . atorvastatin  80 mg Oral q1800  . calcitRIOL  0.5 mcg Oral Daily  . carvedilol  25 mg Oral BID WC  . cloNIDine  0.1 mg Oral Daily  . docusate sodium  100 mg Oral BID  . heparin  5,000 Units Subcutaneous 3 times per day  . hydrALAZINE  50 mg Oral Q8H  . lisinopril  5 mg Oral Daily  . multivitamin  1 tablet Oral Daily  . pantoprazole  40 mg Oral Q1200  . polyethylene glycol  17 g Oral Daily  . rOPINIRole  0.25-0.5 mg Oral QHS  . sevelamer carbonate  4,000 mg Oral TID WC  . sodium chloride  3 mL Intravenous Q12H       Physical Exam: Vital signs in last 24 hours: Temp:  [98.1 F (36.7 C)-98.8 F (37.1 C)] 98.5 F (36.9 C) (12/09 0759) Pulse Rate:  [76-103] 86 (12/09 0700) Resp:  [13-26] 17 (12/09 0700) BP: (119-171)/(49-143) 124/64 mmHg (12/09 0912) SpO2:  [95 %-100 %] 96 % (12/09 0700) Weight:  [159 lb 13.3 oz (72.5 kg)-167 lb 1.7 oz (75.8 kg)] 163 lb 5.8 oz (74.1 kg) (12/09 0500) Weight change: 7 lb 1.7 oz (3.225 kg) Last BM Date: 08/29/14  Intake/Output from previous day: 12/08 0701 - 12/09 0700 In: -  Out: 3230 [Urine:25]     Physical Exam: General- pt is awake,alert, oriented to time place and person  Resp- NO REsp distress,  Decreased BS at bases  CVS- S1S2 regular in rate and rhythm  GIT- BS+, soft, NT, ND,PD cath in situ EXT- 1+ LE Edema, No Cyanosis  Access- AVF+      Lab Results: CBC  Recent Labs  08/29/14 0928 08/30/14 0515  WBC 11.1* 7.5  HGB 9.5* 8.2*  HCT 31.0* 27.1*  PLT 374 285    BMET  Recent Labs  08/29/14 0928 08/30/14 0515  NA  141 142  K 4.2 4.2  CL 96 100  CO2 28 29  GLUCOSE 115* 110*  BUN 50* 30*  CREATININE 8.31* 5.53*  CALCIUM 9.5 9.0    MICRO Recent Results (from the past 240 hour(s))  MRSA PCR Screening     Status: None   Collection Time: 08/29/14  6:36 PM  Result Value Ref Range Status   MRSA by PCR NEGATIVE NEGATIVE Final    Comment:        The GeneXpert MRSA Assay (FDA approved for NASAL specimens only), is one component of a comprehensive MRSA colonization surveillance program. It is not intended to diagnose MRSA infection nor to guide or monitor treatment for MRSA infections.       Lab Results  Component Value Date   PTH 559.0* 06/24/2012   CALCIUM 9.0 08/30/2014   PHOS 4.9* 08/22/2014            Impression: 1)Renal ESRD on HD  Pt transitioned from PD to HD in recent past. Now on TTS schedule as outpt Pt last dialyzed yesterday.NO need of HD today .  2)HTN BP  n at goal  Sec to compliance issues  Medication  On Calcium Channel Blockers  On Alpha and beta Blockers  On Vasodilators-  On Central Acting Sympatholytics-   3)Anemia HGb at goal (9--11)   on Epo   4)CKD Mineral-Bone Disorder  PTH acceptable .  Secondary Hyperparathyroidism present.  Phosphorus at goal.   5)CHF- admitted with acute exacerbation  Fluid overload   Now much better  6)Electrolytes  Normokalemic  NOrmonatremic   7)Acid base  Co2 at goal    Plan:  Will continue current care Will dialyze in am    BHUTANI,MANPREET S 08/31/2014, 10:09 AM

## 2014-09-01 ENCOUNTER — Encounter (HOSPITAL_COMMUNITY): Payer: Self-pay | Admitting: Cardiology

## 2014-09-01 MED ORDER — LISINOPRIL 10 MG PO TABS
10.0000 mg | ORAL_TABLET | Freq: Every day | ORAL | Status: DC
  Administered 2014-09-02: 10 mg via ORAL
  Filled 2014-09-01: qty 1

## 2014-09-01 MED ORDER — EPOETIN ALFA 10000 UNIT/ML IJ SOLN
INTRAMUSCULAR | Status: AC
  Administered 2014-09-01: 10000 [IU] via INTRAVENOUS
  Filled 2014-09-01: qty 1

## 2014-09-01 MED ORDER — ISOSORBIDE DINITRATE 20 MG PO TABS
10.0000 mg | ORAL_TABLET | Freq: Three times a day (TID) | ORAL | Status: DC
  Administered 2014-09-01 – 2014-09-02 (×4): 10 mg via ORAL
  Filled 2014-09-01 (×4): qty 1

## 2014-09-01 MED ORDER — EPOETIN ALFA 10000 UNIT/ML IJ SOLN
14000.0000 [IU] | INTRAMUSCULAR | Status: DC
  Administered 2014-09-01: 10000 [IU] via INTRAVENOUS
  Filled 2014-09-01 (×2): qty 2

## 2014-09-01 MED ORDER — SODIUM CHLORIDE 0.9 % IJ SOLN
INTRAMUSCULAR | Status: AC
  Administered 2014-09-01: 10 mL via INTRAVENOUS
  Filled 2014-09-01: qty 6

## 2014-09-01 MED ORDER — EPOETIN ALFA 4000 UNIT/ML IJ SOLN
INTRAMUSCULAR | Status: AC
  Administered 2014-09-01: 4000 [IU]
  Filled 2014-09-01: qty 1

## 2014-09-01 NOTE — Plan of Care (Signed)
Problem: Phase III Progression Outcomes Goal: Pain controlled on oral analgesia Outcome: Completed/Met Date Met:  09/01/14 Goal: Up in chair for Hemodialysis Outcome: Progressing Goal: Tolerating diet Outcome: Completed/Met Date Met:  09/01/14 Goal: Hemodynamically stable Outcome: Progressing Goal: Dyspnea controlled with activity Outcome: Progressing Goal: < 1 kg Weight Gain for 24 hrs. Outcome: Completed/Met Date Met:  09/01/14

## 2014-09-01 NOTE — Progress Notes (Signed)
Much improved no significant dyspnea currently on dialysis today systolic 417/12 Randy [name] HKN:[id number] DOB: 02-01-1932 DOA: 08/29/2014 PCP: [name], MD             Physical Exam: Blood pressure 112/61, pulse 63, temperature 98 F (36.7 C), temperature source Oral, resp. rate 18, height _0  (1.753 m), weight 167 lb 4.8 oz (75.887 kg), SpO2 99 %. no JVD no bruits no thyromegaly lungs clear to A&P diminished breath sounds in the bases no rales no rhonchi appreciable heart regular rhythm no S3-S4 no heaves or rubs abdomen soft nontender bowel sounds normoactive  Investigations:  Recent Results (from the past 240 hour(s))  MRSA PCR Screening     Status: None   Collection Time: 08/29/14  6:36 PM  Result Value Ref Range Status   MRSA by PCR NEGATIVE NEGATIVE Final    Comment:        The GeneXpert MRSA Assay (FDA approved for NASAL specimens only), is one component of a comprehensive MRSA colonization surveillance program. It is not intended to diagnose MRSA infection nor to guide or monitor treatment for MRSA infections.      Basic Metabolic Panel:  Recent Labs  08/30/14 0515  NA 142  K 4.2  CL 100  CO2 29  GLUCOSE 110*  BUN 30*  CREATININE 5.53*  CALCIUM 9.0   Liver Function Tests: No results for input(s): AST, ALT, ALKPHOS, BILITOT, PROT, ALBUMIN in the last 72 hours.   CBC:  Recent Labs  08/30/14 0515  WBC 7.5  HGB 8.2*  HCT 27.1*  MCV 98.9  PLT 285    No results found.    Medications:  Impression: Accelerated hypertension Principal Problem:   Acute respiratory failure with hypoxia Active Problems:   End stage renal disease, has been peritoneal, now hemodialysis   Elevated troponin   Fluid overload   Acute on chronic combined systolic and diastolic CHF (congestive heart failure)   Acute respiratory failure with hypoxemia     Plan: Increase lisinopril to 10 hydralazine 50 every 8 her vital 25 twice a day monitor  electrolytes  Consultants: Cardiology and nephrology    Procedures hemodialysis   Antibiotics:                  Code Status:  Family Communication:    Disposition Plan monitor be met in a.m.  Time spent: 30 minutes   LOS: 3 days   Randy [name] M   09/01/2014, 12:49 PM

## 2014-09-01 NOTE — Progress Notes (Signed)
Patient is having dialysis in room at this time.

## 2014-09-01 NOTE — Progress Notes (Signed)
Called report to [employer]. Patient taken by wheel chair to [phone] with all belongings. Patient was awake and oriented at time of transfer.

## 2014-09-01 NOTE — Care Management Utilization Note (Signed)
UR complete 

## 2014-09-01 NOTE — Progress Notes (Signed)
Subjective: Interval History: has no complaint of difficulty in breathing. Patient claims he is feeling much better. He denies any orthopnea . Presently he does not have any chest pain, no nausea or vomiting..  Objective: Vital signs in last 24 hours: Temp:  [97.4 F (36.3 C)-98.5 F (36.9 C)] 97.4 F (36.3 C) (12/10 0735) Pulse Rate:  [70-84] 70 (12/10 0800) Resp:  [11-25] 15 (12/10 0800) BP: (107-147)/(42-92) 147/92 mmHg (12/10 0800) SpO2:  [92 %-100 %] 100 % (12/10 0800) FiO2 (%):  [28 %] 28 % (12/09 1800) Weight:  [73.7 kg (162 lb 7.7 oz)] 73.7 kg (162 lb 7.7 oz) (12/10 0500) Weight change: -2.1 kg (-4 lb 10.1 oz)  Intake/Output from previous day: 12/09 0701 - 12/10 0700 In: 803 [P.O.:800; I.V.:3] Out: 70 [Urine:26; Stool:1] Intake/Output this shift: Total I/O In: 90 [P.O.:90] Out: -   General appearance: alert, cooperative and no distress Resp: clear to auscultation bilaterally Cardio: regular rate and rhythm, S1, S2 normal, no murmur, click, rub or gallop GI: soft, non-tender; bowel sounds normal; no masses,  no organomegaly Extremities: extremities normal, atraumatic, no cyanosis or edema  Lab Results:  Recent Labs  08/30/14 0515  WBC 7.5  HGB 8.2*  HCT 27.1*  PLT 285   BMET:  Recent Labs  08/30/14 0515  NA 142  K 4.2  CL 100  CO2 29  GLUCOSE 110*  BUN 30*  CREATININE 5.53*  CALCIUM 9.0   No results for input(s): PTH in the last 72 hours. Iron Studies: No results for input(s): IRON, TIBC, TRANSFERRIN, FERRITIN in the last 72 hours.  Studies/Results: No results found.  I have reviewed the patient's current medications.  Assessment/Plan: Problem #1 difficulty breathing: Most likely from CHF. Patient is status post hemodialysis with ultrafiltration on Tuesday. Presently he feels much better. Patient has this moment NO complaints. Problem #2 end-stage renal disease: His potassium is good, no nausea or vomiting. Problem #3 anemia: His hemoglobin is  below range. Problem #4 hypertension: His blood pressure is better controlled. Presently patient is on carvedilol 25 mg by mouth twice a day, hydralazine 50 mg by mouth 3 times a day ,Lisinopril 5 mg once a day and clonidine 0.1 mg once a day  Problem #5 metabolic bone disease: His calcium is range. Problem #6 history of coronary artery disease: Presently patient is asymptomatic. And patient is also followed by cardiology. Plan: We'll dialyze patient today and will try to move about 3 and half liters  If his blood pressure tolerates. We'll increase lisinopril to 10 mg by mouth once a day. We'll increase his Epogen 14,000 units IV after each dialysis.   LOS: 3 days   Kenesha Moshier S 09/01/2014,9:45 AM

## 2014-09-01 NOTE — Progress Notes (Signed)
Patient ID: Randy [name], male   DOB: 02/04/1932, 80 y.o.   MRN: [id number]    Subjective:    SOB improved  Objective:   Temp:  [97.4 F (36.3 C)-98.5 F (36.9 C)] 97.4 F (36.3 C) (12/10 0735) Pulse Rate:  [70-84] 70 (12/10 0800) Resp:  [11-25] 15 (12/10 0800) BP: (107-147)/(42-92) 147/92 mmHg (12/10 0800) SpO2:  [92 %-100 %] 100 % (12/10 0800) FiO2 (%):  [28 %] 28 % (12/09 1800) Weight:  [162 lb 7.7 oz (73.7 kg)] 162 lb 7.7 oz (73.7 kg) (12/10 0500) Last BM Date: 08/31/14  Filed Weights   08/30/14 1645 08/31/14 0500 09/01/14 0500  Weight: 159 lb 13.3 oz (72.5 kg) 163 lb 5.8 oz (74.1 kg) 162 lb 7.7 oz (73.7 kg)    Intake/Output Summary (Last 24 hours) at 09/01/14 0858 Last data filed at 09/01/14 0800  Gross per 24 hour  Intake    573 ml  Output     27 ml  Net    546 ml    Telemetry: NSR  Exam:  General: NAD  Resp: CTAB  Cardiac: RRR, 2/6 systolic murmur RUSB, no JVD  GI: abdomen soft, NT, ND  MSK: no LE edema  Neuro: no focal deficits   Lab Results:  Basic Metabolic Panel:  Recent Labs Lab 08/29/14 0928 08/30/14 0515  NA 141 142  K 4.2 4.2  CL 96 100  CO2 28 29  GLUCOSE 115* 110*  BUN 50* 30*  CREATININE 8.31* 5.53*  CALCIUM 9.5 9.0    Liver Function Tests: No results for input(s): AST, ALT, ALKPHOS, BILITOT, PROT, ALBUMIN in the last 168 hours.  CBC:  Recent Labs Lab 08/29/14 0928 08/30/14 0515  WBC 11.1* 7.5  HGB 9.5* 8.2*  HCT 31.0* 27.1*  MCV 98.7 98.9  PLT 374 285    Cardiac Enzymes:  Recent Labs Lab 08/29/14 2351 08/30/14 0544 08/30/14 1340  TROPONINI 0.84* 0.85* 0.68*    BNP:  Recent Labs  06/21/14 1046 08/17/14 0625 08/29/14 0928  PROBNP >70000.0* >70000.0* >70000.0*    Coagulation: No results for input(s): INR in the last 168 hours.  ECG:   Medications:   Scheduled Medications: . aspirin  325 mg Oral BID  . atorvastatin  80 mg Oral q1800  . calcitRIOL  0.5 mcg Oral Daily  . carvedilol  25 mg  Oral BID WC  . cloNIDine  0.1 mg Oral Daily  . docusate sodium  100 mg Oral BID  . heparin  5,000 Units Subcutaneous 3 times per day  . hydrALAZINE  50 mg Oral Q8H  . lisinopril  5 mg Oral Daily  . multivitamin  1 tablet Oral Daily  . pantoprazole  40 mg Oral Q1200  . polyethylene glycol  17 g Oral Daily  . rOPINIRole  0.25-0.5 mg Oral QHS  . sevelamer carbonate  4,000 mg Oral TID WC  . sodium chloride  3 mL Intravenous Q12H     Infusions:     PRN Medications:  sodium chloride, sodium chloride, sodium chloride, acetaminophen **OR** acetaminophen, feeding supplement (NEPRO CARB STEADY), heparin, lidocaine (PF), lidocaine-prilocaine, morphine injection, ondansetron **OR** ondansetron (ZOFRAN) IV, pentafluoroprop-tetrafluoroeth, sevelamer carbonate, sodium chloride, sodium chloride, sodium chloride     Assessment/Plan   1. Acute on chronic combined systolic and diastolic HF - patient with ESRD, symptoms and volume status have improved with HD during admission - repeat echo with stable LV function, pericardial effusion resolving.  - will start isordil tid to combine with hydral  he is already on. Continue other meds.    2. HTN - significantly elevated bp's on admission, after HD bp much improved. Changed norvasc to lisionpril for secondary benefits given his systolic HF. Weaning clonidine, will stop after todays dose (order placed to stop after todays dose).   3. ESRD - HD per renal  4. Elevated troponin/CAD - mild troponin elevation likely demand ischemia given volume overload and respiratory distress, peak trop 0.85 Recent cath with disease but no culprit lesions, his CAD has been medically managed. - recent admission with periomyocarditis, f/u repeat echo, continue bid ASA for now, would like change back to once a day ASA at the end of the month (that will complete 4 weeks of treatment).    No further cardiac testing or intreventions planned at this time. He will need to  f/u with NP Purcell Nails in our cardiology clinic 2 weeks after discharge. Will sign off inpatient care.     Carlyle Dolly, M.D.

## 2014-09-02 MED ORDER — LISINOPRIL 10 MG PO TABS: 10.0000 mg | ORAL_TABLET | Freq: Every day | ORAL | Status: DC

## 2014-09-02 MED ORDER — CARVEDILOL 25 MG PO TABS: 25.0000 mg | ORAL_TABLET | Freq: Two times a day (BID) | ORAL | Status: DC

## 2014-09-02 NOTE — Progress Notes (Signed)
Subjective: Interval History: Patient feeling good and offers no complaint  Objective: Vital signs in last 24 hours: Temp:  [98.9 F (37.2 C)-99.3 F (37.4 C)] 98.9 F (37.2 C) (12/11 0555) Pulse Rate:  [63-83] 79 (12/11 0932) Resp:  [18] 18 (12/11 0555) BP: (101-150)/(46-80) 142/61 mmHg (12/11 0934) SpO2:  [97 %-100 %] 98 % (12/11 0555) FiO2 (%):  [28 %] 28 % (12/10 1445) Weight:  [71.124 kg (156 lb 12.8 oz)] 71.124 kg (156 lb 12.8 oz) (12/11 0555) Weight change: 2.187 kg (4 lb 13.1 oz)  Intake/Output from previous day: 12/10 0701 - 12/11 0700 In: 38 [P.O.:570] Out: -  Intake/Output this shift: Total I/O In: 360 [P.O.:360] Out: -   General appearance: alert, cooperative and no distress Resp: clear to auscultation bilaterally Cardio: regular rate and rhythm, S1, S2 normal, no murmur, click, rub or gallop GI: soft, non-tender; bowel sounds normal; no masses,  no organomegaly Extremities: extremities normal, atraumatic, no cyanosis or edema  Lab Results: No results for input(s): WBC, HGB, HCT, PLT in the last 72 hours. BMET: No results for input(s): NA, K, CL, CO2, GLUCOSE, BUN, CREATININE, CALCIUM in the last 72 hours. No results for input(s): PTH in the last 72 hours. Iron Studies: No results for input(s): IRON, TIBC, TRANSFERRIN, FERRITIN in the last 72 hours.  Studies/Results: No results found.  I have reviewed the patient's current medications.  Assessment/Plan: Problem #1 difficulty breathing: He is s/p UFR yesterday and  Feels much better and no sign of fluid over load Problem #2 end-stage renal disease: No uremic sign and symptoms and potassium is good Problem #3 anemia: His hemoglobin is below range. Problem #4 hypertension: His blood pressure is reasonably controlled Problem #5 metabolic bone disease: His calcium is range. Problem #6 history of coronary artery disease: No chest pain Plan: We'll dialyze patient tomorrow and can be done as out patient if  discharged tomorrow Continue with present treatment  LOS: 4 days   Kip Cropp S 09/02/2014,11:36 AM

## 2014-09-02 NOTE — Progress Notes (Signed)
Randy [name] discharged home with wife per MD order.  Discharge instructions reviewed and discussed with the patient and sons at bedside, all questions and concerns answered. Copy of instructions and scripts given to patient.    Medication List    TAKE these medications        amLODipine 10 MG tablet  Commonly known as:  NORVASC  Take 1 tablet (10 mg total) by mouth daily.     aspirin 325 MG tablet  Take 1 tablet (325 mg total) by mouth 2 (two) times daily.     calcitRIOL 0.5 MCG capsule  Commonly known as:  ROCALTROL  Take 1 capsule (0.5 mcg total) by mouth daily.     carvedilol 25 MG tablet  Commonly known as:  COREG  Take 1 tablet (25 mg total) by mouth 2 (two) times daily with a meal.     cloNIDine 0.1 MG tablet  Commonly known as:  CATAPRES  Take 1 tablet (0.1 mg total) by mouth 2 (two) times daily.     DSS 100 MG Caps  Take 100 mg by mouth 2 (two) times daily.     hydrALAZINE 25 MG tablet  Commonly known as:  APRESOLINE  Take 1 tablet (25 mg total) by mouth 3 (three) times daily.     labetalol 200 MG tablet  Commonly known as:  NORMODYNE  Take 2 tablets (400 mg total) by mouth 2 (two) times daily.     lisinopril 10 MG tablet  Commonly known as:  PRINIVIL,ZESTRIL  Take 1 tablet (10 mg total) by mouth daily.     multivitamin Tabs tablet  Take 1 tablet by mouth daily.     pantoprazole 40 MG tablet  Commonly known as:  PROTONIX  Take 1 tablet (40 mg total) by mouth daily at 12 noon.     polyethylene glycol packet  Commonly known as:  MIRALAX / GLYCOLAX  Take 17 g by mouth daily.     rOPINIRole 0.25 MG tablet  Commonly known as:  REQUIP  Take 0.25-0.5 mg by mouth at bedtime. Takes depending on bad restless legs are.     sevelamer carbonate 800 MG tablet  Commonly known as:  RENVELA  Take 2,400-4,000 mg by mouth 3 (three) times daily with meals. To take 5 with meals and 3 with snacks     simvastatin 40 MG tablet  Commonly known as:  ZOCOR  Take 1 tablet  (40 mg total) by mouth every Monday, Wednesday, and Friday.        Patients skin is clean, dry and intact, no evidence of skin break down. IV site discontinued and catheter remains intact. Site without signs and symptoms of complications. Dressing and pressure applied.  Patient escorted to car by Ubaldo Glassing, NT in a wheelchair,  no distress noted upon discharge.  Regino Bellow 09/02/2014 1:46 PM

## 2014-09-02 NOTE — Care Management Utilization Note (Signed)
UR complete 

## 2014-09-02 NOTE — Discharge Summary (Signed)
Physician Discharge Summary  Randy [name] KYH:[id number] DOB: 1931-10-22 DOA: 08/29/2014  PCP: [name], MD  Admit date: 08/29/2014 Discharge date: 09/02/2014   Recommendations for Outpatient Follow-up:  Follow-up with dialysis in the morning with a Dr. Lavone Orn to 4 days Discharge Diagnoses:  Principal Problem:   Acute respiratory failure with hypoxia Active Problems:   End stage renal disease, has been peritoneal, now hemodialysis   Elevated troponin   Fluid overload   Acute on chronic combined systolic and diastolic CHF (congestive heart failure)   Acute respiratory failure with hypoxemia   Discharge Condition:   Filed Weights   09/01/14 0500 09/01/14 1130 09/02/14 0555  Weight: 162 lb 7.7 oz (73.7 kg) 167 lb 4.8 oz (75.887 kg) 156 lb 12.8 oz (71.124 kg)    History of present illness:  patient presented with severe hypertension and acute systolic and diastolic heart failure with chronic ischemic cardiomyopathy end-stage renal disease hypokalemia was repleted to 4 L taken out and began to improve with dialysis on 3 subsequent days and improved.  She'll elevated TD echocardiogram revealed ejection fraction 30% of mercury irritation to hypokinesis his antihypertensive regimen was altered to include hydralazine 50 by mouth every 8 lisinopril 10 and was quite hemodynamically stable he's to follow-up with dialysis in a.   Hospital Course:  Patient admitted to the hepatology apparently due to demand ischemia global ejection to represent a 20 by echo similarly stable. Stable for discharge per cardiology and myself shows  Procedures:  Hemodialysis  Consultations:  Cardiology and nephrology   Discharge Instructions  Discharge Instructions    Discharge instructions    Complete by:  As directed      Discharge patient    Complete by:  As directed             Medication List    TAKE these medications        amLODipine 10 MG tablet  Commonly known as:  NORVASC   Take 1 tablet (10 mg total) by mouth daily.     aspirin 325 MG tablet  Take 1 tablet (325 mg total) by mouth 2 (two) times daily.     calcitRIOL 0.5 MCG capsule  Commonly known as:  ROCALTROL  Take 1 capsule (0.5 mcg total) by mouth daily.     carvedilol 25 MG tablet  Commonly known as:  COREG  Take 1 tablet (25 mg total) by mouth 2 (two) times daily with a meal.     cloNIDine 0.1 MG tablet  Commonly known as:  CATAPRES  Take 1 tablet (0.1 mg total) by mouth 2 (two) times daily.     DSS 100 MG Caps  Take 100 mg by mouth 2 (two) times daily.     hydrALAZINE 25 MG tablet  Commonly known as:  APRESOLINE  Take 1 tablet (25 mg total) by mouth 3 (three) times daily.     labetalol 200 MG tablet  Commonly known as:  NORMODYNE  Take 2 tablets (400 mg total) by mouth 2 (two) times daily.     lisinopril 10 MG tablet  Commonly known as:  PRINIVIL,ZESTRIL  Take 1 tablet (10 mg total) by mouth daily.     multivitamin Tabs tablet  Take 1 tablet by mouth daily.     pantoprazole 40 MG tablet  Commonly known as:  PROTONIX  Take 1 tablet (40 mg total) by mouth daily at 12 noon.     polyethylene glycol packet  Commonly known as:  MIRALAX / GLYCOLAX  Take 17 g by mouth daily.     rOPINIRole 0.25 MG tablet  Commonly known as:  REQUIP  Take 0.25-0.5 mg by mouth at bedtime. Takes depending on bad restless legs are.     sevelamer carbonate 800 MG tablet  Commonly known as:  RENVELA  Take 2,400-4,000 mg by mouth 3 (three) times daily with meals. To take 5 with meals and 3 with snacks     simvastatin 40 MG tablet  Commonly known as:  ZOCOR  Take 1 tablet (40 mg total) by mouth every Monday, Wednesday, and Friday.       No Known Allergies    The results of significant diagnostics from this hospitalization (including imaging, microbiology, ancillary and laboratory) are listed below for reference.    Significant Diagnostic Studies: Dg Chest Port 1 View  08/29/2014   CLINICAL  DATA:  Short of breath, dialysis  EXAM: PORTABLE CHEST - 1 VIEW  COMPARISON:  Radiograph 08/17/2014  FINDINGS: Stable enlarged cardiac silhouette. There is central venous pulmonary congestion. There is left basilar atelectasis with small effusion. There is mild increase airspace disease at the lung bases. No pneumothorax.  IMPRESSION: Central venous congestion with increased mild pulmonary edema.  Left basilar atelectasis and small effusion.   Electronically Signed   By: Suzy Bouchard M.D.   On: 08/29/2014 10:02   Dg Chest Port 1 View  08/17/2014   CLINICAL DATA:  Chest pain.  EXAM: PORTABLE CHEST - 1 VIEW  COMPARISON:  06/21/2014  FINDINGS: Unchanged cardiomegaly. Mild aortic tortuosity which is also stable. Ongoing pulmonary edema with small to moderate left pleural effusion. No pneumothorax.  IMPRESSION: Unchanged pulmonary edema and left pleural effusion.   Electronically Signed   By: Jorje Guild M.D.   On: 08/17/2014 06:33    Microbiology: Recent Results (from the past 240 hour(s))  MRSA PCR Screening     Status: None   Collection Time: 08/29/14  6:36 PM  Result Value Ref Range Status   MRSA by PCR NEGATIVE NEGATIVE Final    Comment:        The GeneXpert MRSA Assay (FDA approved for NASAL specimens only), is one component of a comprehensive MRSA colonization surveillance program. It is not intended to diagnose MRSA infection nor to guide or monitor treatment for MRSA infections.      Labs: Basic Metabolic Panel:  Recent Labs Lab 08/29/14 0928 08/30/14 0515  NA 141 142  K 4.2 4.2  CL 96 100  CO2 28 29  GLUCOSE 115* 110*  BUN 50* 30*  CREATININE 8.31* 5.53*  CALCIUM 9.5 9.0   Liver Function Tests: No results for input(s): AST, ALT, ALKPHOS, BILITOT, PROT, ALBUMIN in the last 168 hours. No results for input(s): LIPASE, AMYLASE in the last 168 hours. No results for input(s): AMMONIA in the last 168 hours. CBC:  Recent Labs Lab 08/29/14 0928 08/30/14 0515    WBC 11.1* 7.5  NEUTROABS 9.5*  --   HGB 9.5* 8.2*  HCT 31.0* 27.1*  MCV 98.7 98.9  PLT 374 285   Cardiac Enzymes:  Recent Labs Lab 08/29/14 0928 08/29/14 1851 08/29/14 2351 08/30/14 0544 08/30/14 1340  TROPONINI 0.31* 0.42* 0.84* 0.85* 0.68*   BNP: BNP (last 3 results)  Recent Labs  06/21/14 1046 08/17/14 0625 08/29/14 0928  PROBNP >70000.0* >70000.0* >70000.0*   CBG: No results for input(s): GLUCAP in the last 168 hours.     Signed:  Braelyn Bordonaro M  [facility] Pager: [phone]  09/02/2014, 12:37 PM

## 2014-09-20 ENCOUNTER — Encounter: Payer: Self-pay | Admitting: *Deleted

## 2014-09-21 ENCOUNTER — Inpatient Hospital Stay (HOSPITAL_COMMUNITY)
Admission: EM | Admit: 2014-09-21 | Discharge: 2014-09-30 | DRG: 377 | Disposition: A | Discharge status: Home / Self Care | Payer: Medicare Other | Attending: Internal Medicine | Admitting: Internal Medicine

## 2014-09-21 ENCOUNTER — Encounter (HOSPITAL_COMMUNITY): Payer: Self-pay

## 2014-09-21 DIAGNOSIS — E780 Pure hypercholesterolemia: Secondary | ICD-10-CM | POA: Diagnosis present

## 2014-09-21 DIAGNOSIS — I2510 Atherosclerotic heart disease of native coronary artery without angina pectoris: Secondary | ICD-10-CM | POA: Diagnosis present

## 2014-09-21 DIAGNOSIS — M1990 Unspecified osteoarthritis, unspecified site: Secondary | ICD-10-CM | POA: Diagnosis present

## 2014-09-21 DIAGNOSIS — Z87891 Personal history of nicotine dependence: Secondary | ICD-10-CM | POA: Diagnosis not present

## 2014-09-21 DIAGNOSIS — K922 Gastrointestinal hemorrhage, unspecified: Secondary | ICD-10-CM | POA: Diagnosis present

## 2014-09-21 DIAGNOSIS — I509 Heart failure, unspecified: Secondary | ICD-10-CM | POA: Diagnosis present

## 2014-09-21 DIAGNOSIS — I252 Old myocardial infarction: Secondary | ICD-10-CM | POA: Diagnosis not present

## 2014-09-21 DIAGNOSIS — J9601 Acute respiratory failure with hypoxia: Secondary | ICD-10-CM

## 2014-09-21 DIAGNOSIS — E785 Hyperlipidemia, unspecified: Secondary | ICD-10-CM | POA: Diagnosis present

## 2014-09-21 DIAGNOSIS — Z992 Dependence on renal dialysis: Secondary | ICD-10-CM

## 2014-09-21 DIAGNOSIS — Z7982 Long term (current) use of aspirin: Secondary | ICD-10-CM

## 2014-09-21 DIAGNOSIS — I132 Hypertensive heart and chronic kidney disease with heart failure and with stage 5 chronic kidney disease, or end stage renal disease: Secondary | ICD-10-CM | POA: Diagnosis present

## 2014-09-21 DIAGNOSIS — Z515 Encounter for palliative care: Secondary | ICD-10-CM

## 2014-09-21 DIAGNOSIS — I739 Peripheral vascular disease, unspecified: Secondary | ICD-10-CM | POA: Diagnosis present

## 2014-09-21 DIAGNOSIS — D62 Acute posthemorrhagic anemia: Secondary | ICD-10-CM | POA: Diagnosis present

## 2014-09-21 DIAGNOSIS — I429 Cardiomyopathy, unspecified: Secondary | ICD-10-CM | POA: Diagnosis present

## 2014-09-21 DIAGNOSIS — D638 Anemia in other chronic diseases classified elsewhere: Secondary | ICD-10-CM | POA: Diagnosis present

## 2014-09-21 DIAGNOSIS — N186 End stage renal disease: Secondary | ICD-10-CM | POA: Diagnosis present

## 2014-09-21 DIAGNOSIS — J811 Chronic pulmonary edema: Secondary | ICD-10-CM

## 2014-09-21 DIAGNOSIS — M109 Gout, unspecified: Secondary | ICD-10-CM | POA: Diagnosis present

## 2014-09-21 DIAGNOSIS — K635 Polyp of colon: Secondary | ICD-10-CM | POA: Diagnosis present

## 2014-09-21 DIAGNOSIS — D509 Iron deficiency anemia, unspecified: Secondary | ICD-10-CM | POA: Diagnosis present

## 2014-09-21 DIAGNOSIS — I319 Disease of pericardium, unspecified: Secondary | ICD-10-CM | POA: Diagnosis present

## 2014-09-21 DIAGNOSIS — Z9049 Acquired absence of other specified parts of digestive tract: Secondary | ICD-10-CM | POA: Diagnosis present

## 2014-09-21 DIAGNOSIS — K5731 Diverticulosis of large intestine without perforation or abscess with bleeding: Principal | ICD-10-CM | POA: Diagnosis present

## 2014-09-21 DIAGNOSIS — Z9981 Dependence on supplemental oxygen: Secondary | ICD-10-CM | POA: Diagnosis not present

## 2014-09-21 DIAGNOSIS — D500 Iron deficiency anemia secondary to blood loss (chronic): Secondary | ICD-10-CM

## 2014-09-21 LAB — BASIC METABOLIC PANEL
Anion gap: 14 (ref 5–15)
BUN: 65 mg/dL — ABNORMAL HIGH (ref 6–23)
CHLORIDE: 101 meq/L (ref 96–112)
CO2: 23 mmol/L (ref 19–32)
CREATININE: 14.45 mg/dL — AB (ref 0.50–1.35)
Calcium: 8.7 mg/dL (ref 8.4–10.5)
GFR calc non Af Amer: 3 mL/min — ABNORMAL LOW (ref >90)
GFR, EST AFRICAN AMERICAN: 3 mL/min — AB (ref >90)
GLUCOSE: 144 mg/dL — AB (ref 70–99)
POTASSIUM: 3.8 mmol/L (ref 3.5–5.1)
Sodium: 138 mmol/L (ref 135–145)

## 2014-09-21 LAB — CBC WITH DIFFERENTIAL/PLATELET
BASOS ABS: 0 10*3/uL (ref 0.0–0.1)
BASOS PCT: 0 % (ref 0–1)
EOS PCT: 3 % (ref 0–5)
Eosinophils Absolute: 0.3 10*3/uL (ref 0.0–0.7)
HEMATOCRIT: 21.4 % — AB (ref 39.0–52.0)
HEMOGLOBIN: 6.7 g/dL — AB (ref 13.0–17.0)
Lymphocytes Relative: 9 % — ABNORMAL LOW (ref 12–46)
Lymphs Abs: 1 10*3/uL (ref 0.7–4.0)
MCH: 30.5 pg (ref 26.0–34.0)
MCHC: 31.3 g/dL (ref 30.0–36.0)
MCV: 97.3 fL (ref 78.0–100.0)
MONO ABS: 0.6 10*3/uL (ref 0.1–1.0)
MONOS PCT: 6 % (ref 3–12)
NEUTROS ABS: 8.5 10*3/uL — AB (ref 1.7–7.7)
Neutrophils Relative %: 82 % — ABNORMAL HIGH (ref 43–77)
Platelets: 272 10*3/uL (ref 150–400)
RBC: 2.2 MIL/uL — ABNORMAL LOW (ref 4.22–5.81)
RDW: 18.4 % — ABNORMAL HIGH (ref 11.5–15.5)
WBC: 10.3 10*3/uL (ref 4.0–10.5)

## 2014-09-21 MED ORDER — SODIUM CHLORIDE 0.9 % IV SOLN
Freq: Once | INTRAVENOUS | Status: AC
  Administered 2014-09-21: 1000 mL via INTRAVENOUS

## 2014-09-21 MED ORDER — SODIUM CHLORIDE 0.9 % IV BOLUS (SEPSIS)
500.0000 mL | Freq: Once | INTRAVENOUS | Status: AC
  Administered 2014-09-21: 500 mL via INTRAVENOUS

## 2014-09-21 NOTE — ED Notes (Signed)
CRITICAL VALUE ALERT  Critical value received:  Hemoglobin 6.7  Date of notification:  09/21/14  Time of notification:  2210  Critical value read back:Yes.    Nurse who received alert:  Lucy Antigua  Responding MD:  Dr. Lacinda Axon  Time MD responded:  2210

## 2014-09-21 NOTE — H&P (Signed)
PULMONARY / CRITICAL CARE MEDICINE   Name: Randy [name] MRN: [id number] DOB: [birth year]    ADMISSION DATE:  09/21/2014 CONSULTATION DATE:  09/21/2014  REFERRING MD :  EDP at [facility]:  LGIB  INITIAL PRESENTATION:  80 y.o. M brought to [facility] 12/30 with several episodes of hematochezia.  In ED, Hgb found to be 6.7. He was transferred to [facility] ICU for further management.     STUDIES:  Echo 12/8 >>> EF 35-40%, mild LVH, diffuse hypokinesis, PAP 59, trivial circumferential pericardial effusion.  SIGNIFICANT EVENTS: 12/30 - admitted with LGIB  HISTORY OF PRESENT ILLNESS:  Randy [name] is a 80 y.o. M with PMH as outlined below including ESRD on daily PD, last treatment one night ago. He presented to [facility] 12/30 with several episodes of liquid bright red stools.  Symptoms began earlier that morning; however, pt did not seek treatment and stated that he wanted to wait and see if it stopped on it's own.  He has not had similar symptoms in the past.  Denies chest pain, SOB, syncope, N/V/D, abdominal pain, hematemesis, melena.  He does have some lightheadedness and reports that he feels weak.  He has been on ASA for myopericarditis (see below) and reports that he takes Aleve every now and then.  Of note, pt was admitted 11/25 for chest pain.  At that time, he was found to have elevated troponins and underwent diagnostic LHC which showed mild-moderate diffuse CAD.  It was felt that lesions did not appear culprits for ACS and that presentation was more likely due to myopericarditis in the setting of renal disease and probably inadequate dialysis.  He was treated with high dose ASA (650mg  q8hrs, colchicine, prednisone, PPI.  He also had recent admission 12/7 for acute hypoxic respiratory failure secondary to fluid overload.  PAST MEDICAL HISTORY :   has a past medical history of Hypertensive heart disease; Hypercholesterolemia; Gout; Secondary cardiomyopathy; Anemia of chronic disease;  Hypertension; Arthritis; Peripheral vascular disease; ESRD on peritoneal dialysis; On home oxygen therapy; Pneumonia (09/2012); Pericarditis (08/17/2014); and NSTEMI (non-ST elevated myocardial infarction) (08/17/2014).  has past surgical history that includes Knee arthroscopy (Right, 2007); Back surgery; Hemorrhoid surgery (1970's); Ganglion cyst excision (01/03/2012); Colonoscopy; AV fistula placement (08/24/2012); Insertion of dialysis catheter (Right); Insertion of dialysis catheter (10/19/2012); Cardiac catheterization (08/17/2014); Cholecystectomy; Lumbar disc surgery (2004); and left heart catheterization with coronary angiogram (N/A, 08/17/2014). Prior to Admission medications   Medication Sig Start Date End Date Taking? Authorizing Provider  amLODipine (NORVASC) 10 MG tablet Take 1 tablet (10 mg total) by mouth daily. 06/23/14   [name], MD  aspirin 325 MG tablet Take 1 tablet (325 mg total) by mouth 2 (two) times daily. 08/23/14   Rhonda G Barrett, PA-C  calcitRIOL (ROCALTROL) 0.5 MCG capsule Take 1 capsule (0.5 mcg total) by mouth daily. 06/23/14   [name], MD  carvedilol (COREG) 25 MG tablet Take 1 tablet (25 mg total) by mouth 2 (two) times daily with a meal. 09/02/14   [name], MD  cloNIDine (CATAPRES) 0.1 MG tablet Take 1 tablet (0.1 mg total) by mouth 2 (two) times daily. 08/23/14   Rhonda G Barrett, PA-C  docusate sodium 100 MG CAPS Take 100 mg by mouth 2 (two) times daily. 08/23/14   Rhonda G Barrett, PA-C  hydrALAZINE (APRESOLINE) 25 MG tablet Take 1 tablet (25 mg total) by mouth 3 (three) times daily. 08/23/14   Rhonda G Barrett, PA-C  labetalol (NORMODYNE)  200 MG tablet Take 2 tablets (400 mg total) by mouth 2 (two) times daily. 07/17/12   Satira Sark, MD  lisinopril (PRINIVIL,ZESTRIL) 10 MG tablet Take 1 tablet (10 mg total) by mouth daily. 09/02/14   [name], MD  multivitamin (RENA-VIT) TABS tablet Take 1 tablet by mouth daily.    Historical  Provider, MD  pantoprazole (PROTONIX) 40 MG tablet Take 1 tablet (40 mg total) by mouth daily at 12 noon. 08/23/14   Rhonda G Barrett, PA-C  polyethylene glycol (MIRALAX / GLYCOLAX) packet Take 17 g by mouth daily. 08/23/14   Rhonda G Barrett, PA-C  rOPINIRole (REQUIP) 0.25 MG tablet Take 0.25-0.5 mg by mouth at bedtime. Takes depending on bad restless legs are.    Historical Provider, MD  sevelamer carbonate (RENVELA) 800 MG tablet Take 2,400-4,000 mg by mouth 3 (three) times daily with meals. To take 5 with meals and 3 with snacks    Historical Provider, MD  simvastatin (ZOCOR) 40 MG tablet Take 1 tablet (40 mg total) by mouth every Monday, Wednesday, and Friday. 08/23/14   Rhonda G Barrett, PA-C   No Known Allergies  FAMILY HISTORY:  Family History  Problem Relation Age of Onset  . Arthritis    . Cancer    . Kidney disease    . Anesthesia problems Neg Hx   . Hypotension Neg Hx   . Malignant hyperthermia Neg Hx   . Pseudochol deficiency Neg Hx   . Cancer Sister   . Cancer Brother     colon  . Cancer Brother     colon    SOCIAL HISTORY:  reports that he quit smoking about 16 years ago. His smoking use included Cigarettes. He has a 30 pack-year smoking history. He quit smokeless tobacco use about 20 years ago. His smokeless tobacco use included Chew. He reports that he does not drink alcohol or use illicit drugs.  REVIEW OF SYSTEMS:   All negative; except for those that are bolded, which indicate positives.  Constitutional: weight loss, weight gain, night sweats, fevers, chills, fatigue, weakness.  HEENT: headaches, sore throat, sneezing, nasal congestion, post nasal drip, difficulty swallowing, tooth/dental problems, visual complaints, visual changes, ear aches. Neuro: difficulty with speech, weakness, numbness, ataxia. CV:  chest pain, orthopnea, PND, swelling in lower extremities, dizziness, palpitations, syncope.  Resp: cough, hemoptysis, dyspnea, wheezing. GI  heartburn,  indigestion, abdominal pain, nausea, vomiting, diarrhea, constipation, change in bowel habits, loss of appetite, hematemesis, melena, hematochezia.  GU: dysuria, change in color of urine, urgency or frequency, flank pain, hematuria. MSK: joint pain or swelling, decreased range of motion. Psych: change in mood or affect, depression, anxiety, suicidal ideations, homicidal ideations. Skin: rash, itching, bruising.   SUBJECTIVE:   VITAL SIGNS: Temp:  [97.9 F (36.6 C)] 97.9 F (36.6 C) (12/30 2154) Pulse Rate:  [73] 73 (12/30 2154) Resp:  [18] 18 (12/30 2154) BP: (105)/(45) 105/45 mmHg (12/30 2154) SpO2:  [84 %-93 %] 93 % (12/30 2155) HEMODYNAMICS:   VENTILATOR SETTINGS:   INTAKE / OUTPUT: Intake/Output    None     PHYSICAL EXAMINATION: General: WDWN male, in NAD. Neuro: A&O x 3, non-focal.  HEENT: [place]/AT. PERRL, sclerae anicteric. Cardiovascular: RRR, no M/R/G.  Lungs: Respirations even and unlabored.  CTA bilaterally, No W/R/R. Abdomen: BS x 4, soft, NT/ND. PD catheter in place. Musculoskeletal: No gross deformities, no edema.  Skin: Intact, warm, no rashes.  LABS:  CBC  Recent Labs Lab 09/21/14 2140  WBC 10.3  HGB 6.7*  HCT 21.4*  PLT 272   Coag's No results for input(s): APTT, INR in the last 168 hours. BMET No results for input(s): NA, K, CL, CO2, BUN, CREATININE, GLUCOSE in the last 168 hours. Electrolytes No results for input(s): CALCIUM, MG, PHOS in the last 168 hours. Sepsis Markers No results for input(s): LATICACIDVEN, PROCALCITON, O2SATVEN in the last 168 hours. ABG No results for input(s): PHART, PCO2ART, PO2ART in the last 168 hours. Liver Enzymes No results for input(s): AST, ALT, ALKPHOS, BILITOT, ALBUMIN in the last 168 hours. Cardiac Enzymes No results for input(s): TROPONINI, PROBNP in the last 168 hours. Glucose No results for input(s): GLUCAP in the last 168 hours.  Imaging No results found.   ASSESSMENT /  PLAN:  GASTROINTESTINAL A:   LGIB GI prophylaxis Nutrition P:   GI consulted by EDP. Pantoprazole. NPO for now.  HEMATOLOGIC A:   Acute blood loss anemia + iron deficiency VTE Prophylaxis P:  Transfuse per usual ICU guidelines (is receiving 1 of 2 units PRBC's now). H/H q6hrs x 3. SCD's only. CBC in AM.  PULMONARY A: Acute hypoxic respiratory failure P:   Continue supplemental O2 to maintain SpO2 > 92%. Pulmonary hygiene. CXR as needed.  CARDIOVASCULAR A:  Recent admission for myopericarditis Hx ischemic cardiomyopathy, CHF, HTN,HLD P:  Monitor hemodynamics. Hold outpatient amlodipine, ASA, carvedilol, clonidine, hydralazine, labetalol, lisinopril, simvastatin.  RENAL A:   ESRD on daily PD (primary nephrologist is Dr. Tracey Harries) P:   NS @ 50. Attempted to call nephrology but no answer, day team to please call back. BMP in AM.  INFECTIOUS A:   No indication of infection P:   Monitor clinically.  ENDOCRINE A:   Hyperglycemia   P:   SSI if glucose consistently > 180.  NEUROLOGIC A:   No acute issues P:   No intervention required.   Family updated: Wife.  Interdisciplinary Family Meeting v Palliative Care Meeting:  Due by: 01/06.  CC time: 30 minutes.   Montey Hora, [facility] Pgr: [phone]  or [phone] 09/21/2014, 10:22 PM

## 2014-09-21 NOTE — ED Notes (Signed)
Pt c/o several eipsodes of liquid bright red bloody stools today, denies pain, states it started this am but he waited to see if it might stop on its own.

## 2014-09-21 NOTE — ED Provider Notes (Addendum)
CSN: [id number]     Arrival date & time 09/21/14  2128 History   First MD Initiated Contact with Patient 09/21/14 2136     Chief Complaint  Patient presents with  . GI Bleeding     (Consider location/radiation/quality/duration/timing/severity/associated sxs/prior Treatment) HPI.....Marland Kitchen level V caveat for urgent need for intervention. Bright red blood per rectum times several episodes since earlier today. Patient arrived by EMS with initial blood BP 105/45 P 73.  Patient is status post non-STEMI approximately 1 month ago and has been on peritoneal dialysis for 1-2 years.  This is never happened before. Patient takes baby aspirin daily.  Past Medical History  Diagnosis Date  . Hypertensive heart disease   . Hypercholesterolemia   . Gout   . Secondary cardiomyopathy     LVEF 40-45%  . Anemia of chronic disease   . Hypertension   . Arthritis   . Peripheral vascular disease   . ESRD on peritoneal dialysis   . On home oxygen therapy     "use what I need to" (08/17/2014)  . Pneumonia 09/2012  . Pericarditis 08/17/2014  . NSTEMI (non-ST elevated myocardial infarction) 08/17/2014   Past Surgical History  Procedure Laterality Date  . Knee arthroscopy Right 2007  . Back surgery    . Hemorrhoid surgery  1970's  . Ganglion cyst excision  01/03/2012    Procedure: REMOVAL GANGLION OF WRIST;  Surgeon: Scherry Ran, MD;  Location: [facility];  Service: General;  Laterality: Right;  . Colonoscopy    . Av fistula placement  08/24/2012    Procedure: ARTERIOVENOUS (AV) FISTULA CREATION;  Surgeon: Rosetta Posner, MD;  Location: [place];  Service: Vascular;  Laterality: Left;  . Insertion of dialysis catheter Right      neck  . Insertion of dialysis catheter  10/19/2012    Procedure: INSERTION OF DIALYSIS CATHETER;  Surgeon: Rosetta Posner, MD;  Location: [place];  Service: Vascular;  Laterality: N/A;  REMOVE TEMPORARY CATH  . Cardiac catheterization  08/17/2014  . Cholecystectomy    . Lumbar disc  surgery  2004  . Left heart catheterization with coronary angiogram N/A 08/17/2014    Procedure: LEFT HEART CATHETERIZATION WITH CORONARY ANGIOGRAM;  Surgeon: Larey Dresser, MD;  Location: [facility] C[facility];  Service: Cardiovascular;  Laterality: N/A;   Family History  Problem Relation Age of Onset  . Arthritis    . Cancer    . Kidney disease    . Anesthesia problems Neg Hx   . Hypotension Neg Hx   . Malignant hyperthermia Neg Hx   . Pseudochol deficiency Neg Hx   . Cancer Sister   . Cancer Brother     colon  . Cancer Brother     colon   History  Substance Use Topics  . Smoking status: Former Smoker -- 1.00 packs/day for 30 years    Types: Cigarettes    Quit date: 08/19/1998  . Smokeless tobacco: Former Systems developer    Types: Chew    Quit date: 12/31/1993  . Alcohol Use: No    Review of Systems  Unable to perform ROS: Acuity of condition      Allergies  Review of patient's allergies indicates no known allergies.  Home Medications   Prior to Admission medications   Medication Sig Start Date End Date Taking? Authorizing Provider  amLODipine (NORVASC) 10 MG tablet Take 1 tablet (10 mg total) by mouth daily. 06/23/14   [name], MD  aspirin 325 MG  tablet Take 1 tablet (325 mg total) by mouth 2 (two) times daily. 08/23/14   Rhonda G Barrett, PA-C  calcitRIOL (ROCALTROL) 0.5 MCG capsule Take 1 capsule (0.5 mcg total) by mouth daily. 06/23/14   [name], MD  carvedilol (COREG) 25 MG tablet Take 1 tablet (25 mg total) by mouth 2 (two) times daily with a meal. 09/02/14   [name], MD  cloNIDine (CATAPRES) 0.1 MG tablet Take 1 tablet (0.1 mg total) by mouth 2 (two) times daily. 08/23/14   Rhonda G Barrett, PA-C  docusate sodium 100 MG CAPS Take 100 mg by mouth 2 (two) times daily. 08/23/14   Rhonda G Barrett, PA-C  hydrALAZINE (APRESOLINE) 25 MG tablet Take 1 tablet (25 mg total) by mouth 3 (three) times daily. 08/23/14   Rhonda G Barrett, PA-C  labetalol  (NORMODYNE) 200 MG tablet Take 2 tablets (400 mg total) by mouth 2 (two) times daily. 07/17/12   Satira Sark, MD  lisinopril (PRINIVIL,ZESTRIL) 10 MG tablet Take 1 tablet (10 mg total) by mouth daily. 09/02/14   [name], MD  multivitamin (RENA-VIT) TABS tablet Take 1 tablet by mouth daily.    Historical Provider, MD  pantoprazole (PROTONIX) 40 MG tablet Take 1 tablet (40 mg total) by mouth daily at 12 noon. 08/23/14   Rhonda G Barrett, PA-C  polyethylene glycol (MIRALAX / GLYCOLAX) packet Take 17 g by mouth daily. 08/23/14   Rhonda G Barrett, PA-C  rOPINIRole (REQUIP) 0.25 MG tablet Take 0.25-0.5 mg by mouth at bedtime. Takes depending on bad restless legs are.    Historical Provider, MD  sevelamer carbonate (RENVELA) 800 MG tablet Take 2,400-4,000 mg by mouth 3 (three) times daily with meals. To take 5 with meals and 3 with snacks    Historical Provider, MD  simvastatin (ZOCOR) 40 MG tablet Take 1 tablet (40 mg total) by mouth every Monday, Wednesday, and Friday. 08/23/14   Rhonda G Barrett, PA-C   BP 105/45 mmHg  Pulse 69  Temp(Src) 97.9 F (36.6 C) (Oral)  Resp 14  SpO2 100% Physical Exam  Constitutional: He is oriented to person, place, and time.  Thin, alert  HENT:  Head: Normocephalic and atraumatic.  Eyes: Conjunctivae and EOM are normal. Pupils are equal, round, and reactive to light.  Neck: Normal range of motion. Neck supple.  Cardiovascular: Normal rate and regular rhythm.   Pulmonary/Chest: Effort normal and breath sounds normal.  Abdominal: Soft. Bowel sounds are normal.  Genitourinary:  Obvious mucousy blood from rectum. No masses on rectal exam  Musculoskeletal: Normal range of motion.  Neurological: He is alert and oriented to person, place, and time.  Skin: Skin is warm and dry.  Psychiatric: He has a normal mood and affect. His behavior is normal.  Nursing note and vitals reviewed.   ED Course  Procedures (including critical care time) Labs  Review Labs Reviewed  CBC WITH DIFFERENTIAL - Abnormal; Notable for the following:    RBC 2.20 (*)    Hemoglobin 6.7 (*)    HCT 21.4 (*)    RDW 18.4 (*)    All other components within normal limits  BASIC METABOLIC PANEL  TYPE AND SCREEN  PREPARE RBC (CROSSMATCH)    Imaging Review No results found.   EKG Interpretation None     CRITICAL CARE Performed by: Nat Christen Total critical care time: 60 Critical care time was exclusive of separately billable procedures and treating other patients. Critical care was necessary to treat or  prevent imminent or life-threatening deterioration. Critical care was time spent personally by me on the following activities: development of treatment plan with patient and/or surrogate as well as nursing, discussions with consultants, evaluation of patient's response to treatment, examination of patient, obtaining history from patient or surrogate, ordering and performing treatments and interventions, ordering and review of laboratory studies, ordering and review of radiographic studies, pulse oximetry and re-evaluation of patient's condition. MDM   Final diagnoses:  Lower GI bleed    Patient has obvious lower GI bleed. He has complicated medical problems including coronary artery disease and end-stage renal disease on peritoneal dialysis. His blood pressure was initially 105/45, but now has improved with IV hydration. He had a brief syncopal spell that lasted approximate 90 seconds. This has improved. Discussed with Dr. Halford Chessman critical care at [facility]. He will accept pt.   Will also attempt to consult gastroenterology. Hemoglobin 6.7.      O negative blood started.    Nat Christen, MD 09/21/14 [id number]  Nat Christen, MD 09/21/14 [phone]

## 2014-09-22 ENCOUNTER — Encounter (HOSPITAL_COMMUNITY): Payer: Self-pay | Admitting: *Deleted

## 2014-09-22 ENCOUNTER — Inpatient Hospital Stay (HOSPITAL_COMMUNITY): Payer: Medicare Other

## 2014-09-22 LAB — HEMOGLOBIN AND HEMATOCRIT, BLOOD
HCT: 22.4 % — ABNORMAL LOW (ref 39.0–52.0)
HCT: 22.9 % — ABNORMAL LOW (ref 39.0–52.0)
HEMATOCRIT: 25.2 % — AB (ref 39.0–52.0)
HEMOGLOBIN: 7.2 g/dL — AB (ref 13.0–17.0)
Hemoglobin: 7 g/dL — ABNORMAL LOW (ref 13.0–17.0)
Hemoglobin: 8.1 g/dL — ABNORMAL LOW (ref 13.0–17.0)

## 2014-09-22 LAB — CBC
HCT: 22.5 % — ABNORMAL LOW (ref 39.0–52.0)
HCT: 24.7 % — ABNORMAL LOW (ref 39.0–52.0)
HEMOGLOBIN: 7 g/dL — AB (ref 13.0–17.0)
Hemoglobin: 7.9 g/dL — ABNORMAL LOW (ref 13.0–17.0)
MCH: 28 pg (ref 26.0–34.0)
MCH: 27.7 pg (ref 26.0–34.0)
MCHC: 31.1 g/dL (ref 30.0–36.0)
MCHC: 32 g/dL (ref 30.0–36.0)
MCV: 90 fL (ref 78.0–100.0)
MCV: 86.7 fL (ref 78.0–100.0)
PLATELETS: 219 10*3/uL (ref 150–400)
Platelets: 198 10*3/uL (ref 150–400)
RBC: 2.5 MIL/uL — ABNORMAL LOW (ref 4.22–5.81)
RBC: 2.85 MIL/uL — ABNORMAL LOW (ref 4.22–5.81)
RDW: 19.3 % — AB (ref 11.5–15.5)
RDW: 20.4 % — ABNORMAL HIGH (ref 11.5–15.5)
WBC: 9.7 10*3/uL (ref 4.0–10.5)
WBC: 7.9 10*3/uL (ref 4.0–10.5)

## 2014-09-22 LAB — BASIC METABOLIC PANEL
Anion gap: 13 (ref 5–15)
BUN: 57 mg/dL — ABNORMAL HIGH (ref 6–23)
CALCIUM: 8.4 mg/dL (ref 8.4–10.5)
CHLORIDE: 101 meq/L (ref 96–112)
CO2: 22 mmol/L (ref 19–32)
Creatinine, Ser: 14.67 mg/dL — ABNORMAL HIGH (ref 0.50–1.35)
GFR, EST AFRICAN AMERICAN: 3 mL/min — AB (ref >90)
GFR, EST NON AFRICAN AMERICAN: 3 mL/min — AB (ref >90)
GLUCOSE: 160 mg/dL — AB (ref 70–99)
Potassium: 4.2 mmol/L (ref 3.5–5.1)
SODIUM: 136 mmol/L (ref 135–145)

## 2014-09-22 LAB — PHOSPHORUS: PHOSPHORUS: 6.8 mg/dL — AB (ref 2.3–4.6)

## 2014-09-22 LAB — GLUCOSE, CAPILLARY
GLUCOSE-CAPILLARY: 158 mg/dL — AB (ref 70–99)
Glucose-Capillary: 115 mg/dL — ABNORMAL HIGH (ref 70–99)

## 2014-09-22 LAB — MAGNESIUM: Magnesium: 2.1 mg/dL (ref 1.5–2.5)

## 2014-09-22 LAB — PREPARE RBC (CROSSMATCH)

## 2014-09-22 LAB — TROPONIN I: Troponin I: 0.1 ng/mL — ABNORMAL HIGH (ref <0.031)

## 2014-09-22 MED ORDER — DELFLEX-LC/1.5% DEXTROSE 346 MOSM/L IP SOLN
INTRAPERITONEAL | Status: DC
  Administered 2014-09-23 – 2014-09-28 (×4): 5000 mL via INTRAPERITONEAL

## 2014-09-22 MED ORDER — DELFLEX-LC/2.5% DEXTROSE 394 MOSM/L IP SOLN
INTRAPERITONEAL | Status: DC
  Administered 2014-09-23 – 2014-09-28 (×4): 5000 mL via INTRAPERITONEAL

## 2014-09-22 MED ORDER — HYDRALAZINE HCL 20 MG/ML IJ SOLN
10.0000 mg | INTRAMUSCULAR | Status: DC | PRN
  Administered 2014-09-23: 10 mg via INTRAVENOUS
  Administered 2014-09-23: 20 mg via INTRAVENOUS
  Filled 2014-09-22 (×2): qty 1

## 2014-09-22 MED ORDER — SODIUM CHLORIDE 0.9 % IV SOLN: 250.0000 mL | INTRAVENOUS | Status: DC | PRN

## 2014-09-22 MED ORDER — SODIUM CHLORIDE 0.9 % IJ SOLN: 3.0000 mL | Freq: Two times a day (BID) | INTRAMUSCULAR | Status: DC

## 2014-09-22 MED ORDER — SODIUM CHLORIDE 0.9 % IV SOLN
Freq: Once | INTRAVENOUS | Status: AC
  Administered 2014-09-22: 250 mL via INTRAVENOUS

## 2014-09-22 MED ORDER — POLYETHYLENE GLYCOL 3350 17 G PO PACK
17.0000 g | PACK | Freq: Three times a day (TID) | ORAL | Status: DC
  Administered 2014-09-22 – 2014-09-30 (×15): 17 g via ORAL
  Filled 2014-09-22 (×27): qty 1

## 2014-09-22 MED ORDER — SODIUM CHLORIDE 0.9 % IJ SOLN: 3.0000 mL | INTRAMUSCULAR | Status: DC | PRN

## 2014-09-22 MED ORDER — PANTOPRAZOLE SODIUM 40 MG IV SOLR
40.0000 mg | INTRAVENOUS | Status: DC
  Administered 2014-09-22 – 2014-09-23 (×2): 40 mg via INTRAVENOUS
  Filled 2014-09-22 (×3): qty 40

## 2014-09-22 NOTE — Care Management Note (Signed)
    Page 1 of 1   09/22/2014     8:56:24 AM CARE MANAGEMENT NOTE 09/22/2014  Patient:  Randy [name], Randy [name]   Account Number:  [id number]  Date Initiated:  09/22/2014  Documentation initiated by:  Elissa Hefty  Subjective/Objective Assessment:   adm w gi bleed     Action/Plan:   lives w wife, pcp dr don Bea Graff   Anticipated DC Date:     Anticipated DC Plan:           Choice offered to / List presented to:             Status of service:   Medicare Important Message given?   (If response is "NO", the following Medicare IM given date fields will be blank) Date Medicare IM given:   Medicare IM given by:   Date Additional Medicare IM given:   Additional Medicare IM given by:    Discharge Disposition:    Per UR Regulation:  Reviewed for med. necessity/level of care/duration of stay  If discussed at [place] of Stay Meetings, dates discussed:    Comments:

## 2014-09-22 NOTE — H&P (Signed)
PULMONARY / CRITICAL CARE MEDICINE   Name: Randy [name] MRN: [id number] DOB: 1932/02/17    ADMISSION DATE:  09/21/2014 CONSULTATION DATE:  09/22/2014  REFERRING MD :  EDP at [place]:  LGIB  INITIAL PRESENTATION:   Randy [name] is a 80 y.o. M with PMH as outlined below including ESRD on daily PD, last treatment one night ago. He presented to [facility] 12/30 with several episodes of liquid bright red stools.  Symptoms began earlier that morning; however, pt did not seek treatment and stated that he wanted to wait and see if it stopped on it's own.  He has not had similar symptoms in the past.  Denies chest pain, SOB, syncope, N/V/D, abdominal pain, hematemesis, melena.  He does have some lightheadedness and reports that he feels weak.  He has been on ASA for myopericarditis (see below) and reports that he takes Aleve every now and then.  Of note, pt was admitted 11/25 for chest pain.  At that time, he was found to have elevated troponins and underwent diagnostic LHC which showed mild-moderate diffuse CAD.  It was felt that lesions did not appear culprits for ACS and that presentation was more likely due to myopericarditis in the setting of renal disease and probably inadequate dialysis.  He was treated with high dose ASA (650mg  q8hrs, colchicine, prednisone, PPI.  He also had recent admission 12/7 for acute hypoxic respiratory failure secondary to fluid overload.    has a past medical history of Hypertensive heart disease; Hypercholesterolemia; Gout; Secondary cardiomyopathy; Anemia of chronic disease; Hypertension; Arthritis; Peripheral vascular disease; ESRD on peritoneal dialysis; On home oxygen therapy; Pneumonia (09/2012); Pericarditis (08/17/2014); and NSTEMI (non-ST elevated myocardial infarction) (08/17/2014).   has past surgical history that includes Knee arthroscopy (Right, 2007); Back surgery; Hemorrhoid surgery (1970's); Ganglion cyst excision (01/03/2012); Colonoscopy; AV  fistula placement (08/24/2012); Insertion of dialysis catheter (Right); Insertion of dialysis catheter (10/19/2012); Cardiac catheterization (08/17/2014); Cholecystectomy; Lumbar disc surgery (2004); and left heart catheterization with coronary angiogram (N/A, 08/17/2014).   STUDIES and events Echo 12/8 >>> EF 35-40%, mild LVH, diffuse hypokinesis, PAP 59, trivial circumferential pericardial effusion. 12/30 - admitted with LGIB     SUBJECTIVE/OVERNIGHT/INTERVAL HX 09/22/14:  No stool output since arrival to ICU. S/p 2 unit prbc since admission.. Patient and daughter frustrated that he is NPO   VITAL SIGNS: Temp:  [97.7 F (36.5 C)-98.5 F (36.9 C)] 98.2 F (36.8 C) (12/31 0800) Pulse Rate:  [68-90] 68 (12/31 0700) Resp:  [11-21] 11 (12/31 0700) BP: (105-148)/(45-77) 131/59 mmHg (12/31 0700) SpO2:  [71 %-100 %] 100 % (12/31 0700) FiO2 (%):  [100 %] 100 % (12/30 2243) Weight:  [70.1 kg (154 lb 8.7 oz)] 70.1 kg (154 lb 8.7 oz) (12/31 0344) HEMODYNAMICS:   VENTILATOR SETTINGS: Vent Mode:  [-]  FiO2 (%):  [100 %] 100 % INTAKE / OUTPUT: Intake/Output      12/30 0701 - 12/31 0700 12/31 0701 - 01/01 0700   I.V. (mL/kg) 10 (0.1) 10 (0.1)   Blood 652.5    Total Intake(mL/kg) 662.5 (9.5) 10 (0.1)   Net +662.5 +10          PHYSICAL EXAMINATION: General: WDWN male, in NAD. Neuro: A&O x 3, non-focal.  HEENT: [place]/AT. PERRL, sclerae anicteric. Cardiovascular: RRR, no M/R/G.  Lungs: Respirations even and unlabored.  CTA bilaterally, No W/R/R. Abdomen: BS x 4, soft, NT/ND. PD catheter in place. Musculoskeletal: No gross deformities, no edema.  Skin: Intact, warm, no rashes.  LABS: PULMONARY No results for input(s): PHART, PCO2ART, PO2ART, HCO3, TCO2, O2SAT in the last 168 hours.  Invalid input(s): PCO2, PO2  CBC  Recent Labs Lab 09/21/14 2140 09/22/14 0225 09/22/14 0229 09/22/14 0745  HGB 6.7* 7.0* 7.0* 7.9*  HCT 21.4* 22.5* 22.4* 24.7*  WBC 10.3 9.7  --  7.9  PLT 272 219   --  198    COAGULATION No results for input(s): INR in the last 168 hours.  CARDIAC   Recent Labs Lab 09/22/14 0229  TROPONINI 0.10*   No results for input(s): PROBNP in the last 168 hours.   CHEMISTRY  Recent Labs Lab 09/21/14 2140 09/22/14 0225  NA 138 136  K 3.8 4.2  CL 101 101  CO2 23 22  GLUCOSE 144* 160*  BUN 65* 57*  CREATININE 14.45* 14.67*  CALCIUM 8.7 8.4  MG  --  2.1  PHOS  --  6.8*   Estimated Creatinine Clearance: 4 mL/min (by C-G formula based on Cr of 14.67).   LIVER No results for input(s): AST, ALT, ALKPHOS, BILITOT, PROT, ALBUMIN, INR in the last 168 hours.   INFECTIOUS No results for input(s): LATICACIDVEN, PROCALCITON in the last 168 hours.   ENDOCRINE CBG (last 3)   Recent Labs  09/22/14 0022  GLUCAP 158*         IMAGING x48h No results found.     ASSESSMENT / PLAN:  GASTROINTESTINAL A:   LGIB GI prophylaxis Nutrition   - no active bleeding  P:   GI consulted CCM  - Dr Oletta Lamas Pantoprazole. NPO for now (patient and family emphasized rationale, they have accepted)  HEMATOLOGIC A:   Acute blood loss anemia + iron deficiency VTE Prophylaxis   - Hggb now > 7gm%  P:  Transfuse per usual ICU guidelines (is receiving 1 of 2 units PRBC's now).; goal > 7gm% H/H q6hrs x 3. SCD's only. CBC in AM.  PULMONARY A: Acute hypoxic respiratory failure - on [place]. No acute distress  P:   Continue supplemental O2 to maintain SpO2 > 92%. Pulmonary hygiene. CXR as needed.  CARDIOVASCULAR A:  Recent admission for myopericarditis CHF, HTN,HLD   - mild trop bump likely stress or false positive due to high creat  P:  Monitor hemodynamics. Hold outpatient amlodipine, ASA, carvedilol, clonidine, hydralazine, labetalol, lisinopril, simvastatin.  RENAL A:   ESRD on daily PD (primary nephrologist is Dr. Tracey Harries) P:   NS @ 50. Renal Dr Jonnie Finner being called BMP in AM.  INFECTIOUS A:   No indication of  infection P:   Monitor clinically.  ENDOCRINE A:   Hyperglycemia   P:   SSI if glucose consistently > 180.  NEUROLOGIC A:   No acute issues P:   No intervention required.   Family updated:  Daughter, grandson at bedside. - 09/22/14  Interdisciplinary Family Meeting v Palliative Care Meeting:  Due by: 01/06.  DISPO Move to trh / floor based on renal and GI output and status rest of day  Dr. Brand Males, M.D., [facility].C.P Pulmonary and Critical Care Medicine Staff Physician [facility] Pager: [phone], If no answer or between  15:00h - 7:00h: call [phone]  09/22/2014 10:08 AM

## 2014-09-22 NOTE — Consult Note (Signed)
Renal Service Consult Note [name]  Randy [name] 09/22/2014 Sol [place] Requesting Physician:  Randy [name]  Reason for Consult:  ESRD pt with GIB HPI: The patient is a 80 y.o. year-old with hx of HTN, gout, CM EF 45%, CAD/ MI, prn home O2 and ESRD.  He does peritoneal dialysis at home, has been on PD for about a year, ESRD he thinks was due to HTN.  F/B Randy [name].  Admitted for GIB last night. He has rec'd 2u prbc's.  No CXR done that I can see.   No complaints at this time.  Recent admit in Nov 2015 for chest pain, +trop's, underwent LHC with mild-mod diffuse CAD.  Lesions not felt to be causing ACS, they felt he may have had myopericarditis in setting of ESRD and inadequate dialysis.  Rx'd with highdose ASA, colchicine, pred and PPI.    Then was admitted 12/7 for fluid overload.   ROS  no abd pain ,n.v.d  no CP or SOB, no leg swelling  +numbness in both feet  Past Medical History  Past Medical History  Diagnosis Date  . Hypertensive heart disease   . Hypercholesterolemia   . Gout   . Secondary cardiomyopathy     LVEF 40-45%  . Anemia of chronic disease   . Hypertension   . Arthritis   . Peripheral vascular disease   . ESRD on peritoneal dialysis   . On home oxygen therapy     "use what I need to" (08/17/2014)  . Pneumonia 09/2012  . Pericarditis 08/17/2014  . NSTEMI (non-ST elevated myocardial infarction) 08/17/2014   Past Surgical History  Past Surgical History  Procedure Laterality Date  . Knee arthroscopy Right 2007  . Back surgery    . Hemorrhoid surgery  1970's  . Ganglion cyst excision  01/03/2012    Procedure: REMOVAL GANGLION OF WRIST;  Surgeon: Scherry Ran, MD;  Location: [facility];  Service: General;  Laterality: Right;  . Colonoscopy    . Av fistula placement  08/24/2012    Procedure: ARTERIOVENOUS (AV) FISTULA CREATION;  Surgeon: Randy Posner, MD;  Location: [place];  Service: Vascular;  Laterality: Left;  . Insertion of  dialysis catheter Right      neck  . Insertion of dialysis catheter  10/19/2012    Procedure: INSERTION OF DIALYSIS CATHETER;  Surgeon: Randy Posner, MD;  Location: [place];  Service: Vascular;  Laterality: N/A;  REMOVE TEMPORARY CATH  . Cardiac catheterization  08/17/2014  . Cholecystectomy    . Lumbar disc surgery  2004  . Left heart catheterization with coronary angiogram N/A 08/17/2014    Procedure: LEFT HEART CATHETERIZATION WITH CORONARY ANGIOGRAM;  Surgeon: Randy Dresser, MD;  Location: [facility] CATH LAB;  Service: Cardiovascular;  Laterality: N/A;   Family History  Family History  Problem Relation Age of Onset  . Arthritis    . Cancer    . Kidney disease    . Anesthesia problems Neg Hx   . Hypotension Neg Hx   . Malignant hyperthermia Neg Hx   . Pseudochol deficiency Neg Hx   . Cancer Sister   . Cancer Brother     colon  . Cancer Brother     colon   Social History  reports that he quit smoking about 16 years ago. His smoking use included Cigarettes. He has a 30 pack-year smoking history. He quit smokeless tobacco use about 20 years ago. His smokeless tobacco use included Chew. He  reports that he does not drink alcohol or use illicit drugs. Allergies No Known Allergies Home medications Prior to Admission medications   Medication Sig Start Date End Date Taking? Authorizing Provider  aspirin 325 MG tablet Take 1 tablet (325 mg total) by mouth 2 (two) times daily. 08/23/14  Yes Randy G Barrett, PA-C  calcitRIOL (ROCALTROL) 0.5 MCG capsule Take 1 capsule (0.5 mcg total) by mouth daily. 06/23/14  Yes Randy Curet, MD  carvedilol (COREG) 25 MG tablet Take 1 tablet (25 mg total) by mouth 2 (two) times daily with a meal. 09/02/14  Yes Randy Curet, MD  cloNIDine (CATAPRES) 0.1 MG tablet Take 1 tablet (0.1 mg total) by mouth 2 (two) times daily. 08/23/14  Yes Randy G Barrett, PA-C  docusate sodium 100 MG CAPS Take 100 mg by mouth 2 (two) times daily. 08/23/14  Yes Randy G  Barrett, PA-C  hydrALAZINE (APRESOLINE) 25 MG tablet Take 1 tablet (25 mg total) by mouth 3 (three) times daily. 08/23/14  Yes Randy G Barrett, PA-C  labetalol (NORMODYNE) 200 MG tablet Take 2 tablets (400 mg total) by mouth 2 (two) times daily. Patient taking differently: Take 200 mg by mouth at bedtime.  07/17/12  Yes Randy Sark, MD  lisinopril (PRINIVIL,ZESTRIL) 10 MG tablet Take 1 tablet (10 mg total) by mouth daily. 09/02/14  Yes Randy Curet, MD  multivitamin (RENA-VIT) TABS tablet Take 1 tablet by mouth daily.   Yes Historical Provider, MD  pantoprazole (PROTONIX) 40 MG tablet Take 1 tablet (40 mg total) by mouth daily at 12 noon. 08/23/14  Yes Randy G Barrett, PA-C  polyethylene glycol (MIRALAX / GLYCOLAX) packet Take 17 g by mouth daily. 08/23/14  Yes Randy G Barrett, PA-C  rOPINIRole (REQUIP) 0.25 MG tablet Take 0.25-0.5 mg by mouth at bedtime. Takes depending on bad restless legs are.   Yes Historical Provider, MD  sevelamer carbonate (RENVELA) 800 MG tablet Take 2,400-4,000 mg by mouth 3 (three) times daily with meals. To take 5 with meals and 2 with snacks   Yes Historical Provider, MD  simvastatin (ZOCOR) 40 MG tablet Take 1 tablet (40 mg total) by mouth every Monday, Wednesday, and Friday. 08/23/14  Yes Randy G Barrett, PA-C  amLODipine (NORVASC) 10 MG tablet Take 1 tablet (10 mg total) by mouth daily. 06/23/14   Randy Curet, MD   Liver Function Tests No results for input(s): AST, ALT, ALKPHOS, BILITOT, PROT, ALBUMIN in the last 168 hours. No results for input(s): LIPASE, AMYLASE in the last 168 hours. CBC  Recent Labs Lab 09/21/14 2140 09/22/14 0225 09/22/14 0229 09/22/14 0745 09/22/14 1400  WBC 10.3 9.7  --  7.9  --   NEUTROABS 8.5*  --   --   --   --   HGB 6.7* 7.0* 7.0* 7.9* 8.1*  HCT 21.4* 22.5* 22.4* 24.7* 25.2*  MCV 97.3 90.0  --  86.7  --   PLT 272 219  --  198  --    Basic Metabolic Panel  Recent Labs Lab 09/21/14 2140 09/22/14 0225  NA  138 136  K 3.8 4.2  CL 101 101  CO2 23 22  GLUCOSE 144* 160*  BUN 65* 57*  CREATININE 14.45* 14.67*  CALCIUM 8.7 8.4  PHOS  --  6.8*    Filed Vitals:   09/22/14 1200 09/22/14 1300 09/22/14 1400 09/22/14 1500  BP: 153/71 155/71 173/92 126/69  Pulse: 76 80 89 76  Temp: 98.6 F (37 C)  TempSrc: Oral     Resp: 15 18 23 17   Height:      Weight:      SpO2: 100% 100% 100% 100%   Exam Alert, no distress No rash, cyanosis or gangrene Sclera anicteric, throat clear No jvd or bruits Chest is clear to bases bilat RRR 2/6 SEM no RG Abd soft, NTND, no ascites or masses, PD cath site is clean and dry mid abd No LE edema, no ulcer/gangrene/ wounds Neuro is nf, Ox 3   Na 136, K 4.2, CO2 22 BUN 57 , creat 14.67 Phos 6.8, Ca 8.4, Mg 2.1 Hb 7.0 > 8.1 latest WBC 7.9, plt 198  PD: Hangs 10 L of fluid at around 9pm and does 9 hours of PD every night.  No pause or day bag, not sure of dwell volume or which bags he uses. Will plan 5 exchanges over 10 hours nightly and use 1/2 1.5% and 1/2 2.5%.    Assessment: 1. GI bleed - per primary 2. ESRD on PD 3. Volume - no excess volume on exam, BP normal to high on 6 BP meds listed as home meds.  4. Cardiomyopathy - last echo w LVEF 40-45% 5. HTN - on 6 separate BP meds according to home med list 6. 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  I wrote for some prn IV BP meds.  He is taking a lot of BP medication at home but currently BP is ok off the medications.   Kelly Splinter MD (pgr) [phone]    ([phone] 09/22/2014, 3:53 PM

## 2014-09-22 NOTE — Progress Notes (Signed)
Patient had a medium sized bowel movement with dark red clots.

## 2014-09-22 NOTE — Progress Notes (Signed)
RN calling elink:  GI cleared for clear liquid diet and miralax  Patient had no bm at all but after miralax got one bloody BM BP/HR stable  Plan Ok to continue clear liquid diet and miralax Move out of ICU if stable tomorrow am [place] serial CBC; transfuse for hgb  < 7gm% or bp/hr instability with active bleeding  Dr. Brand Males, M.D., [facility].C.P Pulmonary and Critical Care Medicine Staff Physician [place] [facility] Pager: [phone], If no answer or between  15:00h - 7:00h: call [phone]  09/22/2014 8:07 PM

## 2014-09-22 NOTE — Consult Note (Signed)
[facility] Consult Note  Referring Provider: No ref. provider found Primary Care Physician:  [name], MD Primary Gastroenterologist:  Dr.  Laurel Dimmer Complaint: Rectal bleeding HPI: Randy [name] is an 80 y.o. black male  dialysis patient status post NSTEMI 1 month ago who presented with several episodes of painless hematochezia described as bright red with clots yesterday morning and yesterday evening. [place] was not associated with any abdominal pain. His hemoglobin was 6.7 which was down from 8.2 on December 8. The patient thinks he has had a colonoscopy done in [place] proximally 10 years ago but does not know the results. He does not recall any other history of GI bleeding. After his MI he was started on aspirin 325 mg twice a day. But the patient and nurse notes that he has had no stools today and no stool since arriving to the ward.  Past Medical History  Diagnosis Date  . Hypertensive heart disease   . Hypercholesterolemia   . Gout   . Secondary cardiomyopathy     LVEF 40-45%  . Anemia of chronic disease   . Hypertension   . Arthritis   . Peripheral vascular disease   . ESRD on peritoneal dialysis   . On home oxygen therapy     "use what I need to" (08/17/2014)  . Pneumonia 09/2012  . Pericarditis 08/17/2014  . NSTEMI (non-ST elevated myocardial infarction) 08/17/2014    Past Surgical History  Procedure Laterality Date  . Knee arthroscopy Right 2007  . Back surgery    . Hemorrhoid surgery  1970's  . Ganglion cyst excision  01/03/2012    Procedure: REMOVAL GANGLION OF WRIST;  Surgeon: Scherry Ran, MD;  Location: [facility];  Service: General;  Laterality: Right;  . Colonoscopy    . Av fistula placement  08/24/2012    Procedure: ARTERIOVENOUS (AV) FISTULA CREATION;  Surgeon: Rosetta Posner, MD;  Location: [facility];  Service: Vascular;  Laterality: Left;  . Insertion of dialysis catheter Right      neck  . Insertion of dialysis catheter  10/19/2012   Procedure: INSERTION OF DIALYSIS CATHETER;  Surgeon: Rosetta Posner, MD;  Location: [facility];  Service: Vascular;  Laterality: N/A;  REMOVE TEMPORARY CATH  . Cardiac catheterization  08/17/2014  . Cholecystectomy    . Lumbar disc surgery  2004  . Left heart catheterization with coronary angiogram N/A 08/17/2014    Procedure: LEFT HEART CATHETERIZATION WITH CORONARY ANGIOGRAM;  Surgeon: Larey Dresser, MD;  Location: [facility] CATH LAB;  Service: Cardiovascular;  Laterality: N/A;    Medications Prior to Admission  Medication Sig Dispense Refill  . amLODipine (NORVASC) 10 MG tablet Take 1 tablet (10 mg total) by mouth daily. 30 tablet 3  . aspirin 325 MG tablet Take 1 tablet (325 mg total) by mouth 2 (two) times daily.    . calcitRIOL (ROCALTROL) 0.5 MCG capsule Take 1 capsule (0.5 mcg total) by mouth daily. 30 capsule 3  . carvedilol (COREG) 25 MG tablet Take 1 tablet (25 mg total) by mouth 2 (two) times daily with a meal. 60 tablet 3  . cloNIDine (CATAPRES) 0.1 MG tablet Take 1 tablet (0.1 mg total) by mouth 2 (two) times daily. 60 tablet 3  . docusate sodium 100 MG CAPS Take 100 mg by mouth 2 (two) times daily. 10 capsule 0  . hydrALAZINE (APRESOLINE) 25 MG tablet Take 1 tablet (25 mg total) by mouth 3 (three) times daily. 90 tablet 3  . labetalol (  NORMODYNE) 200 MG tablet Take 2 tablets (400 mg total) by mouth 2 (two) times daily. 120 tablet 6  . lisinopril (PRINIVIL,ZESTRIL) 10 MG tablet Take 1 tablet (10 mg total) by mouth daily. 30 tablet 3  . multivitamin (RENA-VIT) TABS tablet Take 1 tablet by mouth daily.    . pantoprazole (PROTONIX) 40 MG tablet Take 1 tablet (40 mg total) by mouth daily at 12 noon. 30 tablet 11  . polyethylene glycol (MIRALAX / GLYCOLAX) packet Take 17 g by mouth daily. 30 each 0  . rOPINIRole (REQUIP) 0.25 MG tablet Take 0.25-0.5 mg by mouth at bedtime. Takes depending on bad restless legs are.    . sevelamer carbonate (RENVELA) 800 MG tablet Take 2,400-4,000 mg by mouth 3  (three) times daily with meals. To take 5 with meals and 3 with snacks    . simvastatin (ZOCOR) 40 MG tablet Take 1 tablet (40 mg total) by mouth every Monday, Wednesday, and Friday. 30 tablet 11    Allergies: No Known Allergies  Family History  Problem Relation Age of Onset  . Arthritis    . Cancer    . Kidney disease    . Anesthesia problems Neg Hx   . Hypotension Neg Hx   . Malignant hyperthermia Neg Hx   . Pseudochol deficiency Neg Hx   . Cancer Sister   . Cancer Brother     colon  . Cancer Brother     colon    Social History:  reports that he quit smoking about 16 years ago. His smoking use included Cigarettes. He has a 30 pack-year smoking history. He quit smokeless tobacco use about 20 years ago. His smokeless tobacco use included Chew. He reports that he does not drink alcohol or use illicit drugs.  Review of Systems: negative except as above   Blood pressure 153/71, pulse 76, temperature 98.6 F (37 C), temperature source Oral, resp. rate 15, height _0  (1.753 m), weight 70.1 kg (154 lb 8.7 oz), SpO2 100 %. Head: Normocephalic, without obvious abnormality, atraumatic Neck: no adenopathy, no carotid bruit, no JVD, supple, symmetrical, trachea midline and thyroid not enlarged, symmetric, no tenderness/mass/nodules Resp: clear to auscultation bilaterally Cardio: regular rate and rhythm, S1, S2 normal, no murmur, click, rub or gallop GI: abdomen soft, nondistended, nontender Extremities: extremities normal, atraumatic, no cyanosis or edema  Results for orders placed or performed during the hospital encounter of 09/21/14 (from the past 48 hour(s))  CBC with Differential     Status: Abnormal   Collection Time: 09/21/14  9:40 PM  Result Value Ref Range   WBC 10.3 4.0 - 10.5 K/uL   RBC 2.20 (L) 4.22 - 5.81 MIL/uL   Hemoglobin 6.7 (LL) 13.0 - 17.0 g/dL    Comment: CRITICAL RESULT CALLED TO, READ BACK BY AND VERIFIED WITH: S.YARBOROUGH AT 2210 ON 09/21/14 BY S.VANHOORNE     HCT 21.4 (L) 39.0 - 52.0 %   MCV 97.3 78.0 - 100.0 fL   MCH 30.5 26.0 - 34.0 pg   MCHC 31.3 30.0 - 36.0 g/dL   RDW 18.4 (H) 11.5 - 15.5 %   Platelets 272 150 - 400 K/uL   Neutrophils Relative % 82 (H) 43 - 77 %   Neutro Abs 8.5 (H) 1.7 - 7.7 K/uL   Lymphocytes Relative 9 (L) 12 - 46 %   Lymphs Abs 1.0 0.7 - 4.0 K/uL   Monocytes Relative 6 3 - 12 %   Monocytes Absolute 0.6 0.1 - 1.0 K/uL  Eosinophils Relative 3 0 - 5 %   Eosinophils Absolute 0.3 0.0 - 0.7 K/uL   Basophils Relative 0 0 - 1 %   Basophils Absolute 0.0 0.0 - 0.1 K/uL   Smear Review MORPHOLOGY UNREMARKABLE   Basic metabolic panel     Status: Abnormal   Collection Time: 09/21/14  9:40 PM  Result Value Ref Range   Sodium 138 135 - 145 mmol/L    Comment: Please note change in reference range.   Potassium 3.8 3.5 - 5.1 mmol/L    Comment: Please note change in reference range.   Chloride 101 96 - 112 mEq/L   CO2 23 19 - 32 mmol/L   Glucose, Bld 144 (H) 70 - 99 mg/dL   BUN 65 (H) 6 - 23 mg/dL   Creatinine, Ser 14.45 (H) 0.50 - 1.35 mg/dL   Calcium 8.7 8.4 - 10.5 mg/dL   GFR calc non Af Amer 3 (L) >90 mL/min   GFR calc Af Amer 3 (L) >90 mL/min    Comment: (NOTE) The eGFR has been calculated using the CKD EPI equation. This calculation has not been validated in all clinical situations. eGFR's persistently <90 mL/min signify possible Chronic Kidney Disease.    Anion gap 14 5 - 15  Type and screen     Status: None (Preliminary result)   Collection Time: 09/21/14  9:40 PM  Result Value Ref Range   ABO/RH(D) O POS    Antibody Screen NEG    Sample Expiration 09/24/2014    Unit Number [id number]    Blood Component Type RBC LR PHER2    Unit division 00    Status of Unit REL FROM [facility]    Transfusion Status OK TO TRANSFUSE    Crossmatch Result Compatible    Unit Number [id number]    Blood Component Type RED CELLS,LR    Unit division 00    Status of Unit ISSUED,FINAL    Transfusion Status OK TO TRANSFUSE     Crossmatch Result COMPATIBLE    Unit Number [id number]    Blood Component Type RED CELLS,LR    Unit division 00    Status of Unit ALLOCATED    Transfusion Status OK TO TRANSFUSE    Crossmatch Result Compatible   Prepare RBC     Status: None   Collection Time: 09/21/14 10:10 PM  Result Value Ref Range   Order Confirmation ORDER PROCESSED BY BLOOD BANK   Glucose, capillary     Status: Abnormal   Collection Time: 09/22/14 12:22 AM  Result Value Ref Range   Glucose-Capillary 158 (H) 70 - 99 mg/dL   Comment 1 Documented in Chart    Comment 2 Notify RN   CBC     Status: Abnormal   Collection Time: 09/22/14  2:25 AM  Result Value Ref Range   WBC 9.7 4.0 - 10.5 K/uL   RBC 2.50 (L) 4.22 - 5.81 MIL/uL   Hemoglobin 7.0 (L) 13.0 - 17.0 g/dL   HCT 22.5 (L) 39.0 - 52.0 %   MCV 90.0 78.0 - 100.0 fL   MCH 28.0 26.0 - 34.0 pg   MCHC 31.1 30.0 - 36.0 g/dL   RDW 19.3 (H) 11.5 - 15.5 %   Platelets 219 150 - 400 K/uL  Basic metabolic panel     Status: Abnormal   Collection Time: 09/22/14  2:25 AM  Result Value Ref Range   Sodium 136 135 - 145 mmol/L    Comment: Please note change in reference  range.   Potassium 4.2 3.5 - 5.1 mmol/L    Comment: Please note change in reference range.   Chloride 101 96 - 112 mEq/L   CO2 22 19 - 32 mmol/L   Glucose, Bld 160 (H) 70 - 99 mg/dL   BUN 57 (H) 6 - 23 mg/dL   Creatinine, Ser 14.67 (H) 0.50 - 1.35 mg/dL   Calcium 8.4 8.4 - 10.5 mg/dL   GFR calc non Af Amer 3 (L) >90 mL/min   GFR calc Af Amer 3 (L) >90 mL/min    Comment: (NOTE) The eGFR has been calculated using the CKD EPI equation. This calculation has not been validated in all clinical situations. eGFR's persistently <90 mL/min signify possible Chronic Kidney Disease.    Anion gap 13 5 - 15  Magnesium     Status: None   Collection Time: 09/22/14  2:25 AM  Result Value Ref Range   Magnesium 2.1 1.5 - 2.5 mg/dL  Phosphorus     Status: Abnormal   Collection Time: 09/22/14  2:25 AM  Result  Value Ref Range   Phosphorus 6.8 (H) 2.3 - 4.6 mg/dL  Hemoglobin and hematocrit, blood     Status: Abnormal   Collection Time: 09/22/14  2:29 AM  Result Value Ref Range   Hemoglobin 7.0 (L) 13.0 - 17.0 g/dL   HCT 22.4 (L) 39.0 - 52.0 %  Troponin I     Status: Abnormal   Collection Time: 09/22/14  2:29 AM  Result Value Ref Range   Troponin I 0.10 (H) <0.031 ng/mL    Comment:        PERSISTENTLY INCREASED TROPONIN VALUES IN THE RANGE OF 0.04-0.49 ng/mL CAN BE SEEN IN:       -UNSTABLE ANGINA       -CONGESTIVE HEART FAILURE       -MYOCARDITIS       -CHEST TRAUMA       -ARRYHTHMIAS       -LATE PRESENTING MYOCARDIAL INFARCTION       -COPD   CLINICAL FOLLOW-UP RECOMMENDED. Please note change in reference range.   Type and screen     Status: None (Preliminary result)   Collection Time: 09/22/14  2:29 AM  Result Value Ref Range   ABO/RH(D) O POS    Antibody Screen NEG    Sample Expiration 09/25/2014    Unit Number [id number]    Blood Component Type RED CELLS,LR    Unit division 00    Status of Unit ISSUED    Transfusion Status OK TO TRANSFUSE    Crossmatch Result Compatible   Prepare RBC     Status: None   Collection Time: 09/22/14  4:30 AM  Result Value Ref Range   Order Confirmation ORDER PROCESSED BY BLOOD BANK   CBC     Status: Abnormal   Collection Time: 09/22/14  7:45 AM  Result Value Ref Range   WBC 7.9 4.0 - 10.5 K/uL   RBC 2.85 (L) 4.22 - 5.81 MIL/uL   Hemoglobin 7.9 (L) 13.0 - 17.0 g/dL   HCT 24.7 (L) 39.0 - 52.0 %   MCV 86.7 78.0 - 100.0 fL   MCH 27.7 26.0 - 34.0 pg   MCHC 32.0 30.0 - 36.0 g/dL   RDW 20.4 (H) 11.5 - 15.5 %   Platelets 198 150 - 400 K/uL   No results found.  Assessment: Lower GI bleed appears to be non-destabilizing at present, painless most likely diverticular.  Plan:  Expectant management initially.  Hold aspirin. Monitor stools and hemoglobin. Consider colonoscopy at some point when felt optimally stable or consider pooled RBC scan  followed by arteriogram with attempted embolization if bleeding worsens. Will allow clear liquid diet and begin Mira lax.  Noella Kipnis C 09/22/2014, 1:10 PM

## 2014-09-22 NOTE — Progress Notes (Signed)
Pt has had a total of 2u PRBC so far. Finished up 1u here from [facility], had another type and crossmatch for [facility] and has had the second unit.

## 2014-09-23 LAB — BASIC METABOLIC PANEL
ANION GAP: 17 — AB (ref 5–15)
BUN: 65 mg/dL — ABNORMAL HIGH (ref 6–23)
CO2: 20 mmol/L (ref 19–32)
Calcium: 8.9 mg/dL (ref 8.4–10.5)
Chloride: 99 mEq/L (ref 96–112)
Creatinine, Ser: 15.97 mg/dL — ABNORMAL HIGH (ref 0.50–1.35)
GFR calc non Af Amer: 2 mL/min — ABNORMAL LOW (ref >90)
GFR, EST AFRICAN AMERICAN: 3 mL/min — AB (ref >90)
GLUCOSE: 156 mg/dL — AB (ref 70–99)
Potassium: 4.5 mmol/L (ref 3.5–5.1)
Sodium: 136 mmol/L (ref 135–145)

## 2014-09-23 LAB — TYPE AND SCREEN
ABO/RH(D): O POS
ANTIBODY SCREEN: NEGATIVE
Unit division: 0

## 2014-09-23 LAB — HEMOGLOBIN AND HEMATOCRIT, BLOOD
HCT: 23.4 % — ABNORMAL LOW (ref 39.0–52.0)
HCT: 26.2 % — ABNORMAL LOW (ref 39.0–52.0)
HEMOGLOBIN: 8.5 g/dL — AB (ref 13.0–17.0)
Hemoglobin: 7.5 g/dL — ABNORMAL LOW (ref 13.0–17.0)

## 2014-09-23 MED ORDER — ONDANSETRON HCL 4 MG/2ML IJ SOLN
4.0000 mg | Freq: Four times a day (QID) | INTRAMUSCULAR | Status: DC | PRN
  Administered 2014-09-23: 4 mg via INTRAVENOUS
  Filled 2014-09-23: qty 2

## 2014-09-23 MED ORDER — ROPINIROLE HCL 0.5 MG PO TABS
0.5000 mg | ORAL_TABLET | Freq: Every day | ORAL | Status: DC
  Administered 2014-09-23 – 2014-09-29 (×7): 0.5 mg via ORAL
  Filled 2014-09-23 (×8): qty 1

## 2014-09-23 MED ORDER — CLONIDINE HCL 0.2 MG/24HR TD PTWK
0.2000 mg | MEDICATED_PATCH | TRANSDERMAL | Status: DC
  Administered 2014-09-23: 0.2 mg via TRANSDERMAL
  Filled 2014-09-23: qty 1

## 2014-09-23 MED ORDER — ROPINIROLE HCL 0.5 MG PO TABS
0.5000 mg | ORAL_TABLET | ORAL | Status: AC
  Administered 2014-09-23: 0.5 mg via ORAL
  Filled 2014-09-23: qty 1

## 2014-09-23 MED ORDER — LISINOPRIL 10 MG PO TABS
10.0000 mg | ORAL_TABLET | Freq: Every evening | ORAL | Status: DC
  Administered 2014-09-23: 10 mg via ORAL
  Filled 2014-09-23 (×2): qty 1

## 2014-09-23 MED ORDER — AMLODIPINE BESYLATE 10 MG PO TABS
10.0000 mg | ORAL_TABLET | Freq: Every evening | ORAL | Status: DC
  Administered 2014-09-23: 10 mg via ORAL
  Filled 2014-09-23 (×2): qty 1

## 2014-09-23 MED ORDER — PANTOPRAZOLE SODIUM 40 MG PO TBEC
40.0000 mg | DELAYED_RELEASE_TABLET | Freq: Every day | ORAL | Status: DC
  Administered 2014-09-23 – 2014-09-29 (×7): 40 mg via ORAL
  Filled 2014-09-23 (×6): qty 1

## 2014-09-23 MED ORDER — PANTOPRAZOLE SODIUM 40 MG PO TBEC: 40.0000 mg | DELAYED_RELEASE_TABLET | Freq: Every day | ORAL | Status: DC

## 2014-09-23 NOTE — Progress Notes (Signed)
[facility] Progress Note  Subjective: 2 small stools today with some blood, no abdominal pain, hemodynamically stable  Objective: Vital signs in last 24 hours: Temp:  [98.1 F (36.7 C)-99.5 F (37.5 C)] 98.3 F (36.8 C) (01/01 1200) Pulse Rate:  [71-89] 82 (01/01 1000) Resp:  [12-30] 15 (01/01 1000) BP: (126-188)/(56-140) 156/127 mmHg (01/01 1000) SpO2:  [97 %-100 %] 97 % (01/01 1000) Weight:  [72.122 kg (159 lb)] 72.122 kg (159 lb) (01/01 0500) Weight change: 2.022 kg (4 lb 7.3 oz)   PE: Abdomen soft  Lab Results: Results for orders placed or performed during the hospital encounter of 09/21/14 (from the past 24 hour(s))  Hemoglobin and hematocrit, blood     Status: Abnormal   Collection Time: 09/22/14  2:00 PM  Result Value Ref Range   Hemoglobin 8.1 (L) 13.0 - 17.0 g/dL   HCT 25.2 (L) 39.0 - 52.0 %  Glucose, capillary     Status: Abnormal   Collection Time: 09/22/14  3:22 PM  Result Value Ref Range   Glucose-Capillary 115 (H) 70 - 99 mg/dL  Hemoglobin and hematocrit, blood     Status: Abnormal   Collection Time: 09/22/14  7:43 PM  Result Value Ref Range   Hemoglobin 7.2 (L) 13.0 - 17.0 g/dL   HCT 22.9 (L) 39.0 - 40.1 %  Basic metabolic panel     Status: Abnormal   Collection Time: 09/23/14  2:18 AM  Result Value Ref Range   Sodium 136 135 - 145 mmol/L   Potassium 4.5 3.5 - 5.1 mmol/L   Chloride 99 96 - 112 mEq/L   CO2 20 19 - 32 mmol/L   Glucose, Bld 156 (H) 70 - 99 mg/dL   BUN 65 (H) 6 - 23 mg/dL   Creatinine, Ser 15.97 (H) 0.50 - 1.35 mg/dL   Calcium 8.9 8.4 - 10.5 mg/dL   GFR calc non Af Amer 2 (L) >90 mL/min   GFR calc Af Amer 3 (L) >90 mL/min   Anion gap 17 (H) 5 - 15  Hemoglobin and hematocrit, blood     Status: Abnormal   Collection Time: 09/23/14  2:18 AM  Result Value Ref Range   Hemoglobin 7.5 (L) 13.0 - 17.0 g/dL   HCT 23.4 (L) 39.0 - 52.0 %  Hemoglobin and hematocrit, blood     Status: Abnormal   Collection Time: 09/23/14  9:40 AM   Result Value Ref Range   Hemoglobin 8.5 (L) 13.0 - 17.0 g/dL   HCT 26.2 (L) 39.0 - 52.0 %    Studies/Results: Dg Chest Port 1 View  09/22/2014   CLINICAL DATA:  81 year old male with history of rectal bleeding, dizziness, nausea and increased blood pressure. Evaluate for potential pulmonary edema.  EXAM: PORTABLE CHEST - 1 VIEW  COMPARISON:  Chest x-ray 08/29/2014.  FINDINGS: Previously noted pulmonary edema has resolved. There is a retrocardiac opacity completely obscuring the left hemidiaphragm, compatible with atelectasis and/or consolidation in the left lower lobe with small left-sided pleural effusion. Right lung is clear. Heart size is mildly enlarged. Upper mediastinal contours are within normal limits. Atherosclerosis in the thoracic aorta.  IMPRESSION: 1. Resolution of previously noted pulmonary edema. 2. Atelectasis and/or consolidation in the left lower lobe with small left-sided pleural effusion. 3. Atherosclerosis. 4. Mild cardiomegaly.   Electronically Signed   By: Vinnie Langton M.D.   On: 09/22/2014 16:34      Assessment: Lower GI bleed, suspect diverticular, seems to have slowed.  Plan: Will continue  to monitor stools and hemoglobin and whether and when to consider colonoscopy.    Vir Whetstine C 09/23/2014, 1:03 PM

## 2014-09-23 NOTE — Progress Notes (Signed)
PULMONARY / CRITICAL CARE MEDICINE   Name: Randy [name] MRN: [id number] DOB: 1932-02-01    ADMISSION DATE:  09/21/2014 CONSULTATION DATE:  09/23/2014  REFERRING MD :  EDP at [place]:  LGIB  INITIAL PRESENTATION:   Randy [name] is a 81 y.o. M with PMH as outlined below including ESRD on daily PD, last treatment one night ago. He presented to [facility] 12/30 with several episodes of liquid bright red stools.  Symptoms began earlier that morning; however, pt did not seek treatment and stated that he wanted to wait and see if it stopped on it's own.  He has not had similar symptoms in the past.  Denies chest pain, SOB, syncope, N/V/D, abdominal pain, hematemesis, melena.  He does have some lightheadedness and reports that he feels weak.  He has been on ASA for myopericarditis (see below) and reports that he takes Aleve every now and then.  Of note, pt was admitted 11/25 for chest pain.  At that time, he was found to have elevated troponins and underwent diagnostic LHC which showed mild-moderate diffuse CAD.  It was felt that lesions did not appear culprits for ACS and that presentation was more likely due to myopericarditis in the setting of renal disease and probably inadequate dialysis.  He was treated with high dose ASA (650mg  q8hrs, colchicine, prednisone, PPI.  He also had recent admission 12/7 for acute hypoxic respiratory failure secondary to fluid overload.    has a past medical history of Hypertensive heart disease; Hypercholesterolemia; Gout; Secondary cardiomyopathy; Anemia of chronic disease; Hypertension; Arthritis; Peripheral vascular disease; ESRD on peritoneal dialysis; On home oxygen therapy; Pneumonia (09/2012); Pericarditis (08/17/2014); and NSTEMI (non-ST elevated myocardial infarction) (08/17/2014).   has past surgical history that includes Knee arthroscopy (Right, 2007); Back surgery; Hemorrhoid surgery (1970's); Ganglion cyst excision (01/03/2012); Colonoscopy; AV  fistula placement (08/24/2012); Insertion of dialysis catheter (Right); Insertion of dialysis catheter (10/19/2012); Cardiac catheterization (08/17/2014); Cholecystectomy; Lumbar disc surgery (2004); and left heart catheterization with coronary angiogram (N/A, 08/17/2014).   STUDIES and events Echo 12/8 >>> EF 35-40%, mild LVH, diffuse hypokinesis, PAP 59, trivial circumferential pericardial effusion. 12/30 - admitted with LGIB  09/22/14:  No stool output since arrival to ICU. S/p 2 unit prbc since admission.. GI cionsult - recommends PRBC/angio if rebleedds. Cleared to eat  SUBJECTIVE/OVERNIGHT/INTERVAL HX  09/23/14: Transient low BP with PD. Also transiet x1 brpr with miralax. Otherwise stable  VITAL SIGNS: Temp:  [98.1 F (36.7 C)-99.5 F (37.5 C)] 98.3 F (36.8 C) (01/01 1200) Pulse Rate:  [71-87] 87 (01/01 1300) Resp:  [11-30] 11 (01/01 1300) BP: (133-188)/(56-140) 133/70 mmHg (01/01 1300) SpO2:  [96 %-100 %] 96 % (01/01 1300) Weight:  [72.122 kg (159 lb)] 72.122 kg (159 lb) (01/01 0500) HEMODYNAMICS:   VENTILATOR SETTINGS:   INTAKE / OUTPUT: Intake/Output      12/31 0701 - 01/01 0700 01/01 0701 - 01/02 0700   P.O. 240    I.V. (mL/kg) 240 (3.3) 70 (1)   Blood     Total Intake(mL/kg) 480 (6.7) 70 (1)   Urine (mL/kg/hr) 210 (0.1)    Total Output 210     Net +270 +70        Stool Occurrence 1 x      PHYSICAL EXAMINATION: General: Looks well chronically ill and some deconditioned Neuro: A&O x 3, non-focal.  HEENT: [employer]/AT. PERRL, sclerae anicteric. Cardiovascular: RRR, no M/R/G.  Lungs: Respirations even and unlabored.  CTA bilaterally, No W/R/R. Abdomen: BS  x 4, soft, NT/ND. PD catheter in place. Musculoskeletal: No gross deformities, no edema.  Skin: Intact, warm, no rashes.  LABS: PULMONARY No results for input(s): PHART, PCO2ART, PO2ART, HCO3, TCO2, O2SAT in the last 168 hours.  Invalid input(s): PCO2, PO2  CBC  Recent Labs Lab 09/21/14 2140 09/22/14 0225   09/22/14 0745  09/22/14 1943 09/23/14 0218 09/23/14 0940  HGB 6.7* 7.0*  < > 7.9*  < > 7.2* 7.5* 8.5*  HCT 21.4* 22.5*  < > 24.7*  < > 22.9* 23.4* 26.2*  WBC 10.3 9.7  --  7.9  --   --   --   --   PLT 272 219  --  198  --   --   --   --   < > = values in this interval not displayed.  COAGULATION No results for input(s): INR in the last 168 hours.  CARDIAC    Recent Labs Lab 09/22/14 0229  TROPONINI 0.10*   No results for input(s): PROBNP in the last 168 hours.   CHEMISTRY  Recent Labs Lab 09/21/14 2140 09/22/14 0225 09/23/14 0218  NA 138 136 136  K 3.8 4.2 4.5  CL 101 101 99  CO2 23 22 20   GLUCOSE 144* 160* 156*  BUN 65* 57* 65*  CREATININE 14.45* 14.67* 15.97*  CALCIUM 8.7 8.4 8.9  MG  --  2.1  --   PHOS  --  6.8*  --    Estimated Creatinine Clearance: 3.7 mL/min (by C-G formula based on Cr of 15.97).   LIVER No results for input(s): AST, ALT, ALKPHOS, BILITOT, PROT, ALBUMIN, INR in the last 168 hours.   INFECTIOUS No results for input(s): LATICACIDVEN, PROCALCITON in the last 168 hours.   ENDOCRINE CBG (last 3)   Recent Labs  09/22/14 0022 09/22/14 1522  GLUCAP 158* 115*         IMAGING x48h Dg Chest Port 1 View  09/22/2014   CLINICAL DATA:  81 year old male with history of rectal bleeding, dizziness, nausea and increased blood pressure. Evaluate for potential pulmonary edema.  EXAM: PORTABLE CHEST - 1 VIEW  COMPARISON:  Chest x-ray 08/29/2014.  FINDINGS: Previously noted pulmonary edema has resolved. There is a retrocardiac opacity completely obscuring the left hemidiaphragm, compatible with atelectasis and/or consolidation in the left lower lobe with small left-sided pleural effusion. Right lung is clear. Heart size is mildly enlarged. Upper mediastinal contours are within normal limits. Atherosclerosis in the thoracic aorta.  IMPRESSION: 1. Resolution of previously noted pulmonary edema. 2. Atelectasis and/or consolidation in the left lower  lobe with small left-sided pleural effusion. 3. Atherosclerosis. 4. Mild cardiomegaly.   Electronically Signed   By: Vinnie Langton M.D.   On: 09/22/2014 16:34       ASSESSMENT / PLAN:  GASTROINTESTINAL A:   LGIB GI prophylaxis Nutrition   - no active bleeding. GI has cleared him for clear liquid diet and miralax on 09/22/14  P:   Hold aspirin Expectant mgmt per GI Pantoprazole. Clear liquid diet with miralax If rebleeds - pooled RBC followed by arteriogram/embolization recommended At some point colonoscopy per GI   HEMATOLOGIC A:   Acute blood loss anemia + iron deficiency VTE Prophylaxis   - Hgb now > 7gm% and stbvle  P:  Transfuse per usual ICU guidelines (is receiving 1 of 2 units PRBC's now).; goal > 7gm% Check Hgb daily SCD's only. CBC in AM.  PULMONARY A: Acute hypoxic respiratory failure - on [place]. No acute distress    -  off o2 09/23/14 P:   Continue supplemental O2 if needed Pulmonary hygiene. CXR as needed.  CARDIOVASCULAR A:  Recent admission for myopericarditis CHF, HTN,HLD   - mild trop bump likely stress or false positive due to high creat  P:  Monitor hemodynamics. Hold outpatient amlodipine, ASA, carvedilol, clonidine, hydralazine, labetalol, lisinopril, simvastatin.- restart slowly starting 09/24/14 per [facility]  RENAL A:   ESRD on daily PD (primary nephrologist is Dr. Tracey Harries) P:   NS @ 50. Renal Dr Jonnie Finner managing BMP in AM.  INFECTIOUS A:   No indication of infection P:   Monitor clinically.  ENDOCRINE A:   Hyperglycemia   P:   SSI if glucose consistently > 180.  NEUROLOGIC A:   No acute issues P:   No intervention required.   Family updated:  Daughter, grandson at bedside. - 09/22/14. Patient updated at bedside 09/23/14  Interdisciplinary Family Meeting v Palliative Care Meeting:  Due by: 01/06.  DISPO  Move to trh / floor . TRH pick up 09/24/14 - d/w Dr Sherral Hammers and PCCM sign off   Dr. Brand Males, M.D.,  [facility].C.P Pulmonary and Critical Care Medicine Staff Physician [facility] Pager: [phone], If no answer or between  15:00h - 7:00h: call [phone]  09/23/2014 3:02 PM

## 2014-09-23 NOTE — Progress Notes (Addendum)
  [facility] Progress Note   Subjective: Feels "swimmy-headed", legs are numb, restless last night.  BP's have been high overnight. Last BM was 6 pm yest, nothing else significant overnight. Hb 7.3 this am.   Filed Vitals:   09/23/14 0400 09/23/14 0500 09/23/14 0600 09/23/14 0800  BP: 164/122 156/72 172/74 176/69  Pulse: 80 85 76 78  Temp:      TempSrc:      Resp: 19 21 14 18   Height:      Weight:  72.122 kg (159 lb)    SpO2: 100% 100% 100% 98%   Exam: Alert, no distress No rash, cyanosis or gangrene Sclera anicteric, throat clear No jvd or bruits Chest is clear to bases bilat RRR 2/6 SEM no RG Abd soft, NTND, no ascites or masses, PD cath site is clean and dry mid abd No LE edema, no ulcer/gangrene/ wounds Neuro is nf, Ox 3    PD: Hangs 10 L of fluid at around 9pm and does 9 hours of PD every night. No pause or day bag, not sure of dwell volume or which bags he uses. Will plan 5 exchanges over 10 hours nightly and use 1/2 1.5% and 1/2 2.5%.   Assessment: 1. GI bleed - Hb 7.3 and stable, s/p 2u prbc on 12/31 2. ESRD on PD 3. Volume - no vol excess 4. HTN - BP's high, prob why he is feeling bad, on 5-6 BP meds at home and they are on hold. Will start Catapres patch and 1-2 of his po BP meds, cont IV prn, get BP down to a reasonable midrange 5. Cardiomyopathy - last echo w LVEF 40-45% 6. 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  Recent Labs Lab  09/21/14 2140 09/22/14 0225  09/22/14 0745 09/22/14 1400 09/22/14 1943 09/23/14 0218  WBC 10.3 9.7  --  7.9  --   --   --   NEUTROABS 8.5*  --   --   --   --   --   --   HGB 6.7* 7.0*  < > 7.9* 8.1* 7.2* 7.5*  HCT 21.4* 22.5*  < > 24.7* 25.2* 22.9* 23.4*  MCV 97.3 90.0  --  86.7  --   --   --   PLT 272 219  --  198  --   --   --   < > = values in this interval not displayed. . pantoprazole (PROTONIX) IV  40 mg Intravenous Q24H  . polyethylene glycol  17 g Oral TID  . rOPINIRole  0.5 mg Oral QHS   . dialysis solution 1.5% low-MG/low-CA    . dialysis solution 2.5% low-MG/low-CA     sodium chloride, hydrALAZINE, ondansetron (ZOFRAN) IV

## 2014-09-24 ENCOUNTER — Inpatient Hospital Stay (HOSPITAL_COMMUNITY): Payer: Medicare Other

## 2014-09-24 LAB — BASIC METABOLIC PANEL
Anion gap: 17 — ABNORMAL HIGH (ref 5–15)
BUN: 54 mg/dL — ABNORMAL HIGH (ref 6–23)
CO2: 20 mmol/L (ref 19–32)
CREATININE: 14.04 mg/dL — AB (ref 0.50–1.35)
Calcium: 8.7 mg/dL (ref 8.4–10.5)
Chloride: 98 mEq/L (ref 96–112)
GFR calc Af Amer: 3 mL/min — ABNORMAL LOW (ref >90)
GFR, EST NON AFRICAN AMERICAN: 3 mL/min — AB (ref >90)
Glucose, Bld: 118 mg/dL — ABNORMAL HIGH (ref 70–99)
Potassium: 4.1 mmol/L (ref 3.5–5.1)
SODIUM: 135 mmol/L (ref 135–145)

## 2014-09-24 LAB — CBC WITH DIFFERENTIAL/PLATELET
BASOS ABS: 0 10*3/uL (ref 0.0–0.1)
Basophils Relative: 0 % (ref 0–1)
Eosinophils Absolute: 0.3 10*3/uL (ref 0.0–0.7)
Eosinophils Relative: 3 % (ref 0–5)
HCT: 23.8 % — ABNORMAL LOW (ref 39.0–52.0)
Hemoglobin: 7.6 g/dL — ABNORMAL LOW (ref 13.0–17.0)
Lymphocytes Relative: 10 % — ABNORMAL LOW (ref 12–46)
Lymphs Abs: 0.8 10*3/uL (ref 0.7–4.0)
MCH: 28.1 pg (ref 26.0–34.0)
MCHC: 31.9 g/dL (ref 30.0–36.0)
MCV: 88.1 fL (ref 78.0–100.0)
Monocytes Absolute: 0.7 10*3/uL (ref 0.1–1.0)
Monocytes Relative: 9 % (ref 3–12)
NEUTROS ABS: 6.4 10*3/uL (ref 1.7–7.7)
Neutrophils Relative %: 78 % — ABNORMAL HIGH (ref 43–77)
PLATELETS: 226 10*3/uL (ref 150–400)
RBC: 2.7 MIL/uL — ABNORMAL LOW (ref 4.22–5.81)
RDW: 19.6 % — ABNORMAL HIGH (ref 11.5–15.5)
WBC: 8.2 10*3/uL (ref 4.0–10.5)

## 2014-09-24 LAB — PROTIME-INR
INR: 1.04 (ref 0.00–1.49)
Prothrombin Time: 13.8 seconds (ref 11.6–15.2)

## 2014-09-24 MED ORDER — TECHNETIUM TC 99M-LABELED RED BLOOD CELLS IV KIT
25.0000 | PACK | Freq: Once | INTRAVENOUS | Status: AC | PRN
  Administered 2014-09-24: 25 via INTRAVENOUS

## 2014-09-24 MED ORDER — AMLODIPINE BESYLATE 10 MG PO TABS
10.0000 mg | ORAL_TABLET | Freq: Every day | ORAL | Status: DC
  Administered 2014-09-24 – 2014-09-30 (×5): 10 mg via ORAL
  Filled 2014-09-24 (×7): qty 1

## 2014-09-24 MED ORDER — ZOLPIDEM TARTRATE 5 MG PO TABS
5.0000 mg | ORAL_TABLET | Freq: Every evening | ORAL | Status: DC | PRN
Start: 2014-09-24 — End: 2014-09-30
  Administered 2014-09-24 – 2014-09-29 (×4): 5 mg via ORAL
  Filled 2014-09-24 (×4): qty 1

## 2014-09-24 MED ORDER — LISINOPRIL 10 MG PO TABS
10.0000 mg | ORAL_TABLET | Freq: Every day | ORAL | Status: DC
  Administered 2014-09-24 – 2014-09-28 (×4): 10 mg via ORAL
  Filled 2014-09-24 (×7): qty 1

## 2014-09-24 MED ORDER — HYDRALAZINE HCL 25 MG PO TABS
25.0000 mg | ORAL_TABLET | Freq: Three times a day (TID) | ORAL | Status: DC
  Administered 2014-09-24 – 2014-09-25 (×3): 25 mg via ORAL
  Filled 2014-09-24 (×6): qty 1

## 2014-09-24 MED ORDER — LABETALOL HCL 200 MG PO TABS
400.0000 mg | ORAL_TABLET | Freq: Two times a day (BID) | ORAL | Status: DC
  Administered 2014-09-24 – 2014-09-25 (×3): 400 mg via ORAL
  Filled 2014-09-24 (×4): qty 2

## 2014-09-24 NOTE — Progress Notes (Signed)
[facility] Progress Note   Subjective: says he is still having some bloody stool, but not a lot.  Hb 7.6 today.  BP a little better, dizziness resolved. Wt up 74kg from 70 on admit.    Filed Vitals:   09/23/14 1600 09/23/14 1719 09/23/14 2148 09/24/14 0419  BP: 164/66 150/75 161/74 151/57  Pulse: 95 101 93 87  Temp: 99.9 F (37.7 C) 99.4 F (37.4 C) 99.1 F (37.3 C) 98.2 F (36.8 C)  TempSrc: Oral Oral Oral Oral  Resp: 18 19 18 17   Height:      Weight:   74.3 kg (163 lb 12.8 oz)   SpO2: 96% 94% 97% 94%   Exam: Alert, no distress No rash, cyanosis or gangrene Sclera anicteric, throat clear No jvd or bruits Chest is clear to bases bilat RRR 2/6 SEM no RG Abd soft, NTND, PD cath site is clean and dry mid abd No LE edema, no ulcer/gangrene/ wounds Neuro is nf, Ox 3    PD: Hangs 10 L of fluid at around 9pm and does 9 hours of PD every night. No pause or day bag, not sure of dwell volume or which bags he uses. Will plan 5 exchanges over 10 hours nightly and use 1/2 1.5% and 1/2 2.5%.   Assessment: 1. GI bleed - Hb 7.3 and stable, s/p 2u prbc on 12/31. Per GI/primary. Suspected lower GI, poss diverticular bleed.  2. ESRD on PD 3. Volume - no vol excess on exam, wt's up some 4. HTN - BP's up but a little better back on some of his home meds.   5. Cardiomyopathy - last echo w LVEF 40-45% 6. 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 Cont CCPD.     Kelly Splinter MD  pager [phone]    cell [phone]  09/24/2014, 10:13 AM     Recent Labs Lab 09/22/14 0225 09/23/14 0218 09/24/14 0545  NA 136 136 135  K 4.2 4.5 4.1  CL 101 99 98  CO2 22 20 20   GLUCOSE 160* 156* 118*  BUN 57* 65* 54*  CREATININE 14.67* 15.97* 14.04*  CALCIUM 8.4 8.9 8.7  PHOS 6.8*  --   --    No results for input(s): AST, ALT, ALKPHOS, BILITOT, PROT, ALBUMIN in the last 168  hours.  Recent Labs Lab 09/21/14 2140 09/22/14 0225  09/22/14 0745  09/23/14 0218 09/23/14 0940 09/24/14 0545  WBC 10.3 9.7  --  7.9  --   --   --  8.2  NEUTROABS 8.5*  --   --   --   --   --   --  6.4  HGB 6.7* 7.0*  < > 7.9*  < > 7.5* 8.5* 7.6*  HCT 21.4* 22.5*  < > 24.7*  < > 23.4* 26.2* 23.8*  MCV 97.3 90.0  --  86.7  --   --   --  88.1  PLT 272 219  --  198  --   --   --  226  < > = values in this interval not displayed. Marland Kitchen amLODipine  10 mg Oral QPM  . cloNIDine  0.2 mg Transdermal Weekly  . lisinopril  10 mg Oral QPM  . pantoprazole  40 mg Oral QHS  . polyethylene glycol  17 g Oral TID  . rOPINIRole  0.5 mg Oral QHS   . dialysis solution 1.5%  low-MG/low-CA    . dialysis solution 2.5% low-MG/low-CA     sodium chloride, hydrALAZINE, ondansetron (ZOFRAN) IV

## 2014-09-24 NOTE — Progress Notes (Signed)
Patient has had 2 dark red bloody stools this morning. Notified GI and NP on call. Will continue to monitor.

## 2014-09-24 NOTE — Progress Notes (Signed)
Patient called for assistance in his room. RN went to patient's room. Patient had PD catheter disconnected with tubing in the trash can. Patient states he had to disconnect himself because he needed to use the bathroom. PD catheter and extension contaminated with bloody stool.Camera operator and RN present. PD machine stopped, PD catheter cleaned with purple wipes, new extension ([employer]) tube connected. PD machine restarted. Patient reminded again to use the call bell for assistance to bathroom, bedside commode placed at bedside.  Will continue to monitor.

## 2014-09-24 NOTE — Progress Notes (Signed)
Patient placed on PD machine for dialysis. No complications occurred, pt comfortable in bed. Will continue to monitor.

## 2014-09-24 NOTE — Progress Notes (Signed)
[facility] PROGRESS NOTE  Randy [name] OHY:[id number] DOB: 06/18/1932 DOA: 09/21/2014 PCP: [name], MD  Assessment/Plan:  Lower GI bleed- Patient's hemoglobin is 7.6, had two small bloody bowel movements. GI has been following, plan is for RBC nuclear tagged scan. Suspected diverticular bleed.  Recent h/o myopericarditis/cardiomyopathy- Had mild elevation of trop, thought to be secondary to renal failure. Aspirin is on hold. Cardiac medications have been restarted  Hypertension- patient started on Catapres 0.2 mg transdermal patch every 72 days, hydralazine 25 mg every 8 hours, labetalol 400 mg twice a day, lisinopril 10 mg daily.  End-stage renal disease- patient is on daily peritoneal dialysis. Nephrology following     Code Status: Full code Family Communication: *No family at bedside Disposition Plan: Home when stable   Consultants:  Nephrology  Gastroenterology   Procedures:  None  Antibiotics:  None  HPI/Subjective: 81 y.o. M with PMH as outlined below including ESRD on daily PD, last treatment one night ago. He presented to [facility] 12/30 with several episodes of liquid bright red stools. Symptoms began earlier that morning; however, pt did not seek treatment and stated that he wanted to wait and see if it stopped on it's own. He has not had similar symptoms in the past. Of note, pt was admitted 11/25 for chest pain. At that time, he was found to have elevated troponins and underwent diagnostic LHC which showed mild-moderate diffuse CAD. It was felt that lesions did not appear culprits for ACS and that presentation was more likely due to myopericarditis in the setting of renal disease and probably inadequate dialysis. He was treated with high dose ASA (650mg  q8hrs, colchicine, prednisone, PPI. He also had recent admission 12/7 for acute hypoxic respiratory failure secondary to fluid overload.GI was consulted for lower GI bleed.  Patient had two  small bloody bowel movements last night.   Objective: Filed Vitals:   09/24/14 0419  BP: 151/57  Pulse: 87  Temp: 98.2 F (36.8 C)  Resp: 17    Intake/Output Summary (Last 24 hours) at 09/24/14 1200 Last data filed at 09/23/14 1700  Gross per 24 hour  Intake    160 ml  Output     75 ml  Net     85 ml   Filed Weights   09/22/14 0344 09/23/14 0500 09/23/14 2148  Weight: 70.1 kg (154 lb 8.7 oz) 72.122 kg (159 lb) 74.3 kg (163 lb 12.8 oz)    Exam:  Physical Exam: Eyes: No icterus, extraocular muscles intact  Lungs: Normal respiratory effort, bilateral clear to auscultation, no crackles or wheezes.  Heart: Regular rate and rhythm, S1 and S2 normal, no murmurs, rubs auscultated Abdomen: BS normoactive,soft,nondistended,non-tender to palpation,no organomegaly Extremities: No pretibial edema, no erythema, no cyanosis, no clubbing Neuro : Alert and oriented to time, place and person, No focal deficits   Data Reviewed: Basic Metabolic Panel:  Recent Labs Lab 09/21/14 2140 09/22/14 0225 09/23/14 0218 09/24/14 0545  NA 138 136 136 135  K 3.8 4.2 4.5 4.1  CL 101 101 99 98  CO2 23 22 20 20   GLUCOSE 144* 160* 156* 118*  BUN 65* 57* 65* 54*  CREATININE 14.45* 14.67* 15.97* 14.04*  CALCIUM 8.7 8.4 8.9 8.7  MG  --  2.1  --   --   PHOS  --  6.8*  --   --    Liver Function Tests: No results for input(s): AST, ALT, ALKPHOS, BILITOT, PROT, ALBUMIN in the last 168 hours. No results for  input(s): LIPASE, AMYLASE in the last 168 hours. No results for input(s): AMMONIA in the last 168 hours. CBC:  Recent Labs Lab 09/21/14 2140 09/22/14 0225  09/22/14 0745 09/22/14 1400 09/22/14 1943 09/23/14 0218 09/23/14 0940 09/24/14 0545  WBC 10.3 9.7  --  7.9  --   --   --   --  8.2  NEUTROABS 8.5*  --   --   --   --   --   --   --  6.4  HGB 6.7* 7.0*  < > 7.9* 8.1* 7.2* 7.5* 8.5* 7.6*  HCT 21.4* 22.5*  < > 24.7* 25.2* 22.9* 23.4* 26.2* 23.8*  MCV 97.3 90.0  --  86.7  --   --    --   --  88.1  PLT 272 219  --  198  --   --   --   --  226  < > = values in this interval not displayed. Cardiac Enzymes:  Recent Labs Lab 09/22/14 0229  TROPONINI 0.10*   BNP (last 3 results)  Recent Labs  06/21/14 1046 08/17/14 0625 08/29/14 0928  PROBNP >70000.0* >70000.0* >70000.0*   CBG:  Recent Labs Lab 09/22/14 0022 09/22/14 1522  GLUCAP 158* 115*    No results found for this or any previous visit (from the past 240 hour(s)).   Studies: Dg Chest Port 1 View  09/22/2014   CLINICAL DATA:  81 year old male with history of rectal bleeding, dizziness, nausea and increased blood pressure. Evaluate for potential pulmonary edema.  EXAM: PORTABLE CHEST - 1 VIEW  COMPARISON:  Chest x-ray 08/29/2014.  FINDINGS: Previously noted pulmonary edema has resolved. There is a retrocardiac opacity completely obscuring the left hemidiaphragm, compatible with atelectasis and/or consolidation in the left lower lobe with small left-sided pleural effusion. Right lung is clear. Heart size is mildly enlarged. Upper mediastinal contours are within normal limits. Atherosclerosis in the thoracic aorta.  IMPRESSION: 1. Resolution of previously noted pulmonary edema. 2. Atelectasis and/or consolidation in the left lower lobe with small left-sided pleural effusion. 3. Atherosclerosis. 4. Mild cardiomegaly.   Electronically Signed   By: Vinnie Langton M.D.   On: 09/22/2014 16:34    Scheduled Meds: . amLODipine  10 mg Oral Daily  . cloNIDine  0.2 mg Transdermal Weekly  . hydrALAZINE  25 mg Oral 3 times per day  . labetalol  400 mg Oral BID  . lisinopril  10 mg Oral Daily  . pantoprazole  40 mg Oral QHS  . polyethylene glycol  17 g Oral TID  . rOPINIRole  0.5 mg Oral QHS   Continuous Infusions: . dialysis solution 1.5% low-MG/low-CA    . dialysis solution 2.5% low-MG/low-CA      Active Problems:   End stage renal disease, has been peritoneal, now hemodialysis   Lower GI bleed   ESRD (end  stage renal disease)   Blood loss anemia    Time spent: *25 min    [facility]  [facility] Pager [phone]*. If 7PM-7AM, please contact night-coverage at [email or web address], password [facility] 09/24/2014, 12:00 PM  LOS: 3 days

## 2014-09-24 NOTE — Progress Notes (Signed)
[facility] Progress Note  Subjective: Still having some bloody stools according to patient and nurse, no abdominal pain, hemodynamically stable  Objective: Vital signs in last 24 hours: Temp:  [98.2 F (36.8 C)-99.9 F (37.7 C)] 98.2 F (36.8 C) (01/02 0419) Pulse Rate:  [85-101] 87 (01/02 0419) Resp:  [11-19] 17 (01/02 0419) BP: (133-172)/(57-126) 151/57 mmHg (01/02 0419) SpO2:  [94 %-98 %] 94 % (01/02 0419) Weight:  [74.3 kg (163 lb 12.8 oz)] 74.3 kg (163 lb 12.8 oz) (01/01 2148) Weight change: 2.178 kg (4 lb 12.8 oz)   PE: Abdomen soft nondistended with normoactive bowel sounds  Lab Results: Results for orders placed or performed during the hospital encounter of 09/21/14 (from the past 24 hour(s))  CBC with Differential     Status: Abnormal   Collection Time: 09/24/14  5:45 AM  Result Value Ref Range   WBC 8.2 4.0 - 10.5 K/uL   RBC 2.70 (L) 4.22 - 5.81 MIL/uL   Hemoglobin 7.6 (L) 13.0 - 17.0 g/dL   HCT 23.8 (L) 39.0 - 52.0 %   MCV 88.1 78.0 - 100.0 fL   MCH 28.1 26.0 - 34.0 pg   MCHC 31.9 30.0 - 36.0 g/dL   RDW 19.6 (H) 11.5 - 15.5 %   Platelets 226 150 - 400 K/uL   Neutrophils Relative % 78 (H) 43 - 77 %   Neutro Abs 6.4 1.7 - 7.7 K/uL   Lymphocytes Relative 10 (L) 12 - 46 %   Lymphs Abs 0.8 0.7 - 4.0 K/uL   Monocytes Relative 9 3 - 12 %   Monocytes Absolute 0.7 0.1 - 1.0 K/uL   Eosinophils Relative 3 0 - 5 %   Eosinophils Absolute 0.3 0.0 - 0.7 K/uL   Basophils Relative 0 0 - 1 %   Basophils Absolute 0.0 0.0 - 0.1 K/uL  Basic metabolic panel     Status: Abnormal   Collection Time: 09/24/14  5:45 AM  Result Value Ref Range   Sodium 135 135 - 145 mmol/L   Potassium 4.1 3.5 - 5.1 mmol/L   Chloride 98 96 - 112 mEq/L   CO2 20 19 - 32 mmol/L   Glucose, Bld 118 (H) 70 - 99 mg/dL   BUN 54 (H) 6 - 23 mg/dL   Creatinine, Ser 14.04 (H) 0.50 - 1.35 mg/dL   Calcium 8.7 8.4 - 10.5 mg/dL   GFR calc non Af Amer 3 (L) >90 mL/min   GFR calc Af Amer 3 (L) >90  mL/min   Anion gap 17 (H) 5 - 15  Protime-INR     Status: None   Collection Time: 09/24/14  5:45 AM  Result Value Ref Range   Prothrombin Time 13.8 11.6 - 15.2 seconds   INR 1.04 0.00 - 1.49    Studies/Results: Dg Chest Port 1 View  09/22/2014   CLINICAL DATA:  81 year old male with history of rectal bleeding, dizziness, nausea and increased blood pressure. Evaluate for potential pulmonary edema.  EXAM: PORTABLE CHEST - 1 VIEW  COMPARISON:  Chest x-ray 08/29/2014.  FINDINGS: Previously noted pulmonary edema has resolved. There is a retrocardiac opacity completely obscuring the left hemidiaphragm, compatible with atelectasis and/or consolidation in the left lower lobe with small left-sided pleural effusion. Right lung is clear. Heart size is mildly enlarged. Upper mediastinal contours are within normal limits. Atherosclerosis in the thoracic aorta.  IMPRESSION: 1. Resolution of previously noted pulmonary edema. 2. Atelectasis and/or consolidation in the left lower lobe with small left-sided  pleural effusion. 3. Atherosclerosis. 4. Mild cardiomegaly.   Electronically Signed   By: Vinnie Langton M.D.   On: 09/22/2014 16:34      Assessment: GI bleeding appears to be lower, possibly persistent despite 3 days off of xarelto Plan: Will plan pooled RBC scan and if positive consider interventional radiology attempt at embolization of bleeding source. Transfuse when necessary.    MDYJW,LKHV C 09/24/2014, 10:36 AM

## 2014-09-25 LAB — TYPE AND SCREEN
ABO/RH(D): O POS
Antibody Screen: NEGATIVE
UNIT DIVISION: 0
UNIT DIVISION: 0
Unit division: 0

## 2014-09-25 LAB — CBC WITH DIFFERENTIAL/PLATELET
Basophils Absolute: 0 10*3/uL (ref 0.0–0.1)
Basophils Relative: 1 % (ref 0–1)
EOS ABS: 0.3 10*3/uL (ref 0.0–0.7)
EOS PCT: 4 % (ref 0–5)
HCT: 23.8 % — ABNORMAL LOW (ref 39.0–52.0)
Hemoglobin: 7.5 g/dL — ABNORMAL LOW (ref 13.0–17.0)
LYMPHS ABS: 0.8 10*3/uL (ref 0.7–4.0)
LYMPHS PCT: 14 % (ref 12–46)
MCH: 27.5 pg (ref 26.0–34.0)
MCHC: 31.5 g/dL (ref 30.0–36.0)
MCV: 87.2 fL (ref 78.0–100.0)
Monocytes Absolute: 0.5 10*3/uL (ref 0.1–1.0)
Monocytes Relative: 9 % (ref 3–12)
Neutro Abs: 4.2 10*3/uL (ref 1.7–7.7)
Neutrophils Relative %: 72 % (ref 43–77)
PLATELETS: 204 10*3/uL (ref 150–400)
RBC: 2.73 MIL/uL — ABNORMAL LOW (ref 4.22–5.81)
RDW: 19.1 % — AB (ref 11.5–15.5)
WBC: 5.8 10*3/uL (ref 4.0–10.5)

## 2014-09-25 LAB — BASIC METABOLIC PANEL
Anion gap: 11 (ref 5–15)
BUN: 47 mg/dL — AB (ref 6–23)
CALCIUM: 8.5 mg/dL (ref 8.4–10.5)
CO2: 26 mmol/L (ref 19–32)
Chloride: 98 mEq/L (ref 96–112)
Creatinine, Ser: 13.66 mg/dL — ABNORMAL HIGH (ref 0.50–1.35)
GFR calc Af Amer: 3 mL/min — ABNORMAL LOW (ref >90)
GFR, EST NON AFRICAN AMERICAN: 3 mL/min — AB (ref >90)
GLUCOSE: 115 mg/dL — AB (ref 70–99)
Potassium: 3.6 mmol/L (ref 3.5–5.1)
Sodium: 135 mmol/L (ref 135–145)

## 2014-09-25 MED ORDER — DARBEPOETIN ALFA 100 MCG/0.5ML IJ SOSY
100.0000 ug | PREFILLED_SYRINGE | Freq: Once | INTRAMUSCULAR | Status: AC
  Administered 2014-09-25: 100 ug via INTRAVENOUS
  Filled 2014-09-25: qty 0.5

## 2014-09-25 MED ORDER — LABETALOL HCL 200 MG PO TABS
200.0000 mg | ORAL_TABLET | Freq: Two times a day (BID) | ORAL | Status: DC
  Administered 2014-09-25 – 2014-09-30 (×9): 200 mg via ORAL
  Filled 2014-09-25 (×11): qty 1

## 2014-09-25 MED ORDER — HYDRALAZINE HCL 25 MG PO TABS
25.0000 mg | ORAL_TABLET | Freq: Two times a day (BID) | ORAL | Status: DC
  Administered 2014-09-25 – 2014-09-30 (×9): 25 mg via ORAL
  Filled 2014-09-25 (×11): qty 1

## 2014-09-25 MED ORDER — RENA-VITE PO TABS
1.0000 | ORAL_TABLET | Freq: Every day | ORAL | Status: DC
  Administered 2014-09-25 – 2014-09-29 (×5): 1 via ORAL
  Filled 2014-09-25 (×8): qty 1

## 2014-09-25 MED ORDER — CLONIDINE HCL 0.1 MG/24HR TD PTWK
0.1000 mg | MEDICATED_PATCH | TRANSDERMAL | Status: DC
  Administered 2014-09-30: 0.1 mg via TRANSDERMAL
  Filled 2014-09-25: qty 1

## 2014-09-25 MED ORDER — SEVELAMER CARBONATE 800 MG PO TABS
1600.0000 mg | ORAL_TABLET | Freq: Three times a day (TID) | ORAL | Status: DC
  Administered 2014-09-25 – 2014-09-30 (×11): 1600 mg via ORAL
  Filled 2014-09-25 (×17): qty 2

## 2014-09-25 MED ORDER — CALCITRIOL 0.5 MCG PO CAPS
0.5000 ug | ORAL_CAPSULE | Freq: Every day | ORAL | Status: DC
  Administered 2014-09-27 – 2014-09-30 (×4): 0.5 ug via ORAL
  Filled 2014-09-25 (×5): qty 1

## 2014-09-25 MED ORDER — PEG 3350-KCL-NA BICARB-NACL 420 G PO SOLR
4000.0000 mL | Freq: Once | ORAL | Status: AC
  Administered 2014-09-25: 4000 mL via ORAL
  Filled 2014-09-25: qty 4000

## 2014-09-25 MED ORDER — SODIUM CHLORIDE 0.9 % IV SOLN
INTRAVENOUS | Status: DC
  Administered 2014-09-25: 13:00:00 via INTRAVENOUS

## 2014-09-25 NOTE — Progress Notes (Signed)
[facility] Progress Note  Subjective: Patient has not had any bloody stools yesterday. No other complaint  Objective: Vital signs in last 24 hours: Temp:  [98.1 F (36.7 C)-99.5 F (37.5 C)] 98.1 F (36.7 C) (01/03 0938) Pulse Rate:  [80-90] 86 (01/03 0938) Resp:  [16-18] 18 (01/03 0938) BP: (101-172)/(52-83) 121/60 mmHg (01/03 0938) SpO2:  [97 %-100 %] 99 % (01/03 0938) Weight:  [70.4 kg (155 lb 3.3 oz)] 70.4 kg (155 lb 3.3 oz) (01/03 0529) Weight change: -3.9 kg (-8 lb 9.6 oz)   PE: Alert oriented abdomen soft  Lab Results: Results for orders placed or performed during the hospital encounter of 09/21/14 (from the past 24 hour(s))  CBC with Differential     Status: Abnormal   Collection Time: 09/25/14  5:33 AM  Result Value Ref Range   WBC 5.8 4.0 - 10.5 K/uL   RBC 2.73 (L) 4.22 - 5.81 MIL/uL   Hemoglobin 7.5 (L) 13.0 - 17.0 g/dL   HCT 23.8 (L) 39.0 - 52.0 %   MCV 87.2 78.0 - 100.0 fL   MCH 27.5 26.0 - 34.0 pg   MCHC 31.5 30.0 - 36.0 g/dL   RDW 19.1 (H) 11.5 - 15.5 %   Platelets 204 150 - 400 K/uL   Neutrophils Relative % 72 43 - 77 %   Neutro Abs 4.2 1.7 - 7.7 K/uL   Lymphocytes Relative 14 12 - 46 %   Lymphs Abs 0.8 0.7 - 4.0 K/uL   Monocytes Relative 9 3 - 12 %   Monocytes Absolute 0.5 0.1 - 1.0 K/uL   Eosinophils Relative 4 0 - 5 %   Eosinophils Absolute 0.3 0.0 - 0.7 K/uL   Basophils Relative 1 0 - 1 %   Basophils Absolute 0.0 0.0 - 0.1 K/uL  Basic metabolic panel     Status: Abnormal   Collection Time: 09/25/14  5:33 AM  Result Value Ref Range   Sodium 135 135 - 145 mmol/L   Potassium 3.6 3.5 - 5.1 mmol/L   Chloride 98 96 - 112 mEq/L   CO2 26 19 - 32 mmol/L   Glucose, Bld 115 (H) 70 - 99 mg/dL   BUN 47 (H) 6 - 23 mg/dL   Creatinine, Ser 13.66 (H) 0.50 - 1.35 mg/dL   Calcium 8.5 8.4 - 10.5 mg/dL   GFR calc non Af Amer 3 (L) >90 mL/min   GFR calc Af Amer 3 (L) >90 mL/min   Anion gap 11 5 - 15    Studies/Results: Nm Gi Blood  Loss  09/24/2014   CLINICAL DATA:  Hematochezia, PT Hx of Hypertensive Heart Disease, Hypercholesterolemia, Secondary Cardiomyopathy, Anemia, HTN, Peripheral Vascular Disease, ESRD (on peritoneal dialysis) Pericarditis, NSTEMI  EXAM: NUCLEAR MEDICINE GASTROINTESTINAL BLEEDING SCAN  TECHNIQUE: Sequential abdominal images were obtained following intravenous administration of Tc-71m labeled red blood cells.  RADIOPHARMACEUTICALS:  25.0 mCi Tc-61m in-vitro labeled red cells.  COMPARISON:  None.  FINDINGS: Physiologic distribution of radiopharmaceutical. No evidence of active extravasation.  IMPRESSION: Negative   Electronically Signed   By: Arne Cleveland M.D.   On: 09/24/2014 14:40      Assessment: GI bleed, presumed lower source no active bleeding by nuclear medicine scan. He thinks it has been over 10 years since he had a colonoscopy.  Plan: The patient would like to go ahead and proceed with colonoscopy which I think it is warranted and he appears to be able to tolerate prep and would like to proceed. We'll schedule  this for tomorrow afternoon    Randy [name] C 09/25/2014, 12:18 PM

## 2014-09-25 NOTE — Progress Notes (Signed)
PROGRESS NOTE    MARKUS CASTEN DUK:[id number] DOB: Apr 26, 1932 DOA: 09/21/2014 PCP: [name], MD  HPI/Brief narrative 81 year old male with history of ESRD on PD, last colonoscopy >10 years ago, recently admitted 08/17/14 for chest pain and elevated troponins at which time he underwent diagnostic LHC that showed mild-to-moderate diffuse CAD and it was felt that his presentation was more likely due to myopericarditis in the setting of inadequate renal dialysis. He was treated with high dose aspirin, colchicine, prednisone and PPI. He then had an admission on 12/7 for acute hypoxic respiratory failure secondary to fluid overload, he now presented to [facility] on 12/30 with several episodes of bright red bleeding per rectum. He was transferred to [facility] and GI was consulted. Nephrology on board for dialysis needs.   Assessment/Plan:  1. Acute lower GI bleed: Suspected diverticular bleed. Negative bleeding scan. Seems to have clinically resolved. GI input and follow-up appreciated: Plan for screening colonoscopy 09/26/14. 2. Acute post hemorrhagic anemia: Complicating underlying chronic anemia. Secondary to GI bleed. Hemoglobin stable over the last 3-4 days. Aranesp per nephrology. 3. ESRD on PD: Management per nephrology. 4. Essential hypertension: Controlled. Due to soft blood pressures today, clonidine reduced from 0.2  to 0.1 transdermally weekly, hydralazine from 25 MG 3 times a day to twice a day and labetalol from 400 MG to 200 MG twice a day and continued on lisinopril 10 MG daily. 5. Cardiomyopathy/recent uremic myopericarditis: Last echo with LVEF 40-45 percent. Aspirin on hold secondary to GI bleed. Minimally elevated troponin of 0.10-likely trending down from recent episode of myopericarditis. No reported chest pain. No further workup.    Code Status: Full Family Communication: None at bedside Disposition Plan: Home when medically  stable.   Consultants:  GI  Nephrology  Procedures:  PD  Antibiotics:  None   Subjective: Feels better. Brown BM today. Denies rectal bleed today. No dizziness or lightheadedness or chest pain or dyspnea.  Objective: Filed Vitals:   09/24/14 2108 09/25/14 0512 09/25/14 0529 09/25/14 0938  BP: 107/53 101/52  121/60  Pulse: 81 80  86  Temp: 99.1 F (37.3 C) 98.1 F (36.7 C)  98.1 F (36.7 C)  TempSrc: Oral Oral  Oral  Resp: 18 16  18   Height:      Weight:   70.4 kg (155 lb 3.3 oz)   SpO2: 98% 100%  99%    Intake/Output Summary (Last 24 hours) at 09/25/14 1637 Last data filed at 09/25/14 1400  Gross per 24 hour  Intake    120 ml  Output    100 ml  Net     20 ml   Filed Weights   09/23/14 0500 09/23/14 2148 09/25/14 0529  Weight: 72.122 kg (159 lb) 74.3 kg (163 lb 12.8 oz) 70.4 kg (155 lb 3.3 oz)     Exam:  General exam:pleasant elderly male lying comfortably in bed. Respiratory system: Clear. No increased work of breathing. Cardiovascular system: S1 & S2 heard, RRR. No JVD, murmurs, gallops, clicks or pedal edema. Telemetry: Sinus rhythm with occasional PVCs.  Gastrointestinal system: Abdomen is nondistended, soft and nontender. Normal bowel sounds heard. Central nervous system: Alert and oriented. No focal neurological deficits. Extremities: Symmetric 5 x 5 power.   Data Reviewed: Basic Metabolic Panel:  Recent Labs Lab 09/21/14 2140 09/22/14 0225 09/23/14 0218 09/24/14 0545 09/25/14 0533  NA 138 136 136 135 135  K 3.8 4.2 4.5 4.1 3.6  CL 101 101 99 98 98  CO2  23 22 20 20 26   GLUCOSE 144* 160* 156* 118* 115*  BUN 65* 57* 65* 54* 47*  CREATININE 14.45* 14.67* 15.97* 14.04* 13.66*  CALCIUM 8.7 8.4 8.9 8.7 8.5  MG  --  2.1  --   --   --   PHOS  --  6.8*  --   --   --    Liver Function Tests: No results for input(s): AST, ALT, ALKPHOS, BILITOT, PROT, ALBUMIN in the last 168 hours. No results for input(s): LIPASE, AMYLASE in the last 168  hours. No results for input(s): AMMONIA in the last 168 hours. CBC:  Recent Labs Lab 09/21/14 2140 09/22/14 0225  09/22/14 0745  09/22/14 1943 09/23/14 0218 09/23/14 0940 09/24/14 0545 09/25/14 0533  WBC 10.3 9.7  --  7.9  --   --   --   --  8.2 5.8  NEUTROABS 8.5*  --   --   --   --   --   --   --  6.4 4.2  HGB 6.7* 7.0*  < > 7.9*  < > 7.2* 7.5* 8.5* 7.6* 7.5*  HCT 21.4* 22.5*  < > 24.7*  < > 22.9* 23.4* 26.2* 23.8* 23.8*  MCV 97.3 90.0  --  86.7  --   --   --   --  88.1 87.2  PLT 272 219  --  198  --   --   --   --  226 204  < > = values in this interval not displayed. Cardiac Enzymes:  Recent Labs Lab 09/22/14 0229  TROPONINI 0.10*   BNP (last 3 results)  Recent Labs  06/21/14 1046 08/17/14 0625 08/29/14 0928  PROBNP >70000.0* >70000.0* >70000.0*   CBG:  Recent Labs Lab 09/22/14 0022 09/22/14 1522  GLUCAP 158* 115*    No results found for this or any previous visit (from the past 240 hour(s)).       Studies: Nm Gi Blood Loss  09/24/2014   CLINICAL DATA:  Hematochezia, PT Hx of Hypertensive Heart Disease, Hypercholesterolemia, Secondary Cardiomyopathy, Anemia, HTN, Peripheral Vascular Disease, ESRD (on peritoneal dialysis) Pericarditis, NSTEMI  EXAM: NUCLEAR MEDICINE GASTROINTESTINAL BLEEDING SCAN  TECHNIQUE: Sequential abdominal images were obtained following intravenous administration of Tc-53m labeled red blood cells.  RADIOPHARMACEUTICALS:  25.0 mCi Tc-58m in-vitro labeled red cells.  COMPARISON:  None.  FINDINGS: Physiologic distribution of radiopharmaceutical. No evidence of active extravasation.  IMPRESSION: Negative   Electronically Signed   By: Arne Cleveland M.D.   On: 09/24/2014 14:40        Scheduled Meds: . amLODipine  10 mg Oral Daily  . [START ON 09/26/2014] calcitRIOL  0.5 mcg Oral Daily  . [START ON 09/30/2014] cloNIDine  0.1 mg Transdermal Weekly  . hydrALAZINE  25 mg Oral BID  . labetalol  200 mg Oral BID  . lisinopril  10 mg Oral  Daily  . multivitamin  1 tablet Oral QHS  . pantoprazole  40 mg Oral QHS  . polyethylene glycol  17 g Oral TID  . polyethylene glycol-electrolytes  4,000 mL Oral Once  . rOPINIRole  0.5 mg Oral QHS  . sevelamer carbonate  1,600 mg Oral TID WC   Continuous Infusions: . sodium chloride 10 mL/hr at 09/25/14 1302  . dialysis solution 1.5% low-MG/low-CA    . dialysis solution 2.5% low-MG/low-CA      Active Problems:   End stage renal disease, has been peritoneal, now hemodialysis   Lower GI bleed   ESRD (end  stage renal disease)   Blood loss anemia    Time spent: 30 minutes.    Vernell Leep, MD, FACP, FHM. [facility] Pager [phone]  If 7PM-7AM, please contact night-coverage [email or web address] Password [email or web address] 09/25/2014, 4:37 PM    LOS: 4 days

## 2014-09-25 NOTE — Progress Notes (Signed)
Subjective:  stools browner this am,tolerating CCPD  Objective Vital signs in last 24 hours: Filed Vitals:   09/24/14 1724 09/24/14 2108 09/25/14 0512 09/25/14 0529  BP: 164/74 107/53 101/52   Pulse: 89 81 80   Temp: 98.9 F (37.2 C) 99.1 F (37.3 C) 98.1 F (36.7 C)   TempSrc: Oral Oral Oral   Resp:  18 16   Height:      Weight:    70.4 kg (155 lb 3.3 oz)  SpO2: 97% 98% 100%    Weight change: -3.9 kg (-8 lb 9.6 oz)  Physical Exam: General:alert, calm , nad Heart: RRR, 2/6 sem lsb, no rug or gallop Lungs: CTA bilat Abdomen: BS pos, soft , NT, ND Extremities: no pedal edema  Dialysis Access: PD cath    Problem/Plan: 1.  GI bleed - Hb 7.5< 7.6<8.5<7.9,<7.0 s/p 2u prbc on 12/31 hgb 6.7 Per GI/primary. Suspected lower GI, poss diverticular bleed. / and Nuc scan  Negative for active bleed 2. ESRD on PD k 3.6 3. Volume - no vol excess on exam, wt's up 1/01 last wt  74.3kg   This am standing 70.4kg/ need daily wts standing post CCPD 4. HTN - BP's up yesterday  And better to lower  101/52  on his home meds. Clonidine 0.2 mg weekly transdermal/decr 0.1mg , hydralazine 25mg  tid  decr bid , labetalol 400 mg to 200mg  5. Cardiomyopathy - last echo w LVEF 40-45% 6. 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 Anemia- ( chronic kidney  dz and gi bld)- was getting esa at [facility] q tues "did not get last  wk'' / start Aranesp 192mcg weekly  Give today  Ernest Haber, PA-C [place] [phone] 09/25/2014,9:39 AM  LOS: 4 days   Pt seen, examined and agree w A/P as above.  Kelly Splinter MD pager [phone]    cell [phone] 09/25/2014, 1:47 PM    Labs: Basic Metabolic Panel:  Recent Labs Lab 09/22/14 0225 09/23/14 0218 09/24/14 0545 09/25/14 0533  NA 136 136 135 135  K 4.2 4.5 4.1 3.6  CL 101 99 98 98  CO2 22 20 20 26   GLUCOSE 160* 156* 118* 115*  BUN 57* 65* 54* 47*  CREATININE 14.67* 15.97* 14.04* 13.66*  CALCIUM 8.4 8.9 8.7 8.5   PHOS 6.8*  --   --   --   CBC:  Recent Labs Lab 09/21/14 2140 09/22/14 0225  09/22/14 0745  09/23/14 0940 09/24/14 0545 09/25/14 0533  WBC 10.3 9.7  --  7.9  --   --  8.2 5.8  NEUTROABS 8.5*  --   --   --   --   --  6.4 4.2  HGB 6.7* 7.0*  < > 7.9*  < > 8.5* 7.6* 7.5*  HCT 21.4* 22.5*  < > 24.7*  < > 26.2* 23.8* 23.8*  MCV 97.3 90.0  --  86.7  --   --  88.1 87.2  PLT 272 219  --  198  --   --  226 204  < > = values in this interval not displayed. Cardiac Enzymes:  Recent Labs Lab 09/22/14 0229  TROPONINI 0.10*   CBG:  Recent Labs Lab 09/22/14 0022 09/22/14 1522  GLUCAP 158* 115*    Studies/Results: Nm Gi Blood Loss  09/24/2014   CLINICAL DATA:  Hematochezia, PT Hx of Hypertensive Heart Disease, Hypercholesterolemia, Secondary Cardiomyopathy, Anemia, HTN, Peripheral Vascular Disease, ESRD (on peritoneal dialysis) Pericarditis, NSTEMI  EXAM: NUCLEAR MEDICINE GASTROINTESTINAL BLEEDING SCAN  TECHNIQUE: Sequential abdominal images were obtained following intravenous administration of Tc-58m labeled red blood cells.  RADIOPHARMACEUTICALS:  25.0 mCi Tc-85m in-vitro labeled red cells.  COMPARISON:  None.  FINDINGS: Physiologic distribution of radiopharmaceutical. No evidence of active extravasation.  IMPRESSION: Negative   Electronically Signed   By: Arne Cleveland M.D.   On: 09/24/2014 14:40   Medications: . dialysis solution 1.5% low-MG/low-CA    . dialysis solution 2.5% low-MG/low-CA     . amLODipine  10 mg Oral Daily  . cloNIDine  0.2 mg Transdermal Weekly  . hydrALAZINE  25 mg Oral 3 times per day  . labetalol  400 mg Oral BID  . lisinopril  10 mg Oral Daily  . multivitamin  1 tablet Oral QHS  . pantoprazole  40 mg Oral QHS  . polyethylene glycol  17 g Oral TID  . rOPINIRole  0.5 mg Oral QHS

## 2014-09-26 ENCOUNTER — Encounter (HOSPITAL_COMMUNITY)
Admission: EM | Disposition: A | Discharge status: Home / Self Care | Payer: Self-pay | Source: Home / Self Care | Attending: Internal Medicine

## 2014-09-26 ENCOUNTER — Encounter (HOSPITAL_COMMUNITY): Payer: Self-pay | Admitting: *Deleted

## 2014-09-26 DIAGNOSIS — I10 Essential (primary) hypertension: Secondary | ICD-10-CM

## 2014-09-26 DIAGNOSIS — D62 Acute posthemorrhagic anemia: Secondary | ICD-10-CM

## 2014-09-26 HISTORY — PX: COLONOSCOPY: SHX5424

## 2014-09-26 LAB — CBC WITH DIFFERENTIAL/PLATELET
Basophils Absolute: 0 10*3/uL (ref 0.0–0.1)
Basophils Absolute: 0 10*3/uL (ref 0.0–0.1)
Basophils Relative: 0 % (ref 0–1)
Basophils Relative: 0 % (ref 0–1)
EOS ABS: 0.3 10*3/uL (ref 0.0–0.7)
EOS ABS: 0.4 10*3/uL (ref 0.0–0.7)
EOS PCT: 7 % — AB (ref 0–5)
Eosinophils Relative: 6 % — ABNORMAL HIGH (ref 0–5)
HEMATOCRIT: 22.4 % — AB (ref 39.0–52.0)
HEMATOCRIT: 24.9 % — AB (ref 39.0–52.0)
Hemoglobin: 7 g/dL — ABNORMAL LOW (ref 13.0–17.0)
Hemoglobin: 8.1 g/dL — ABNORMAL LOW (ref 13.0–17.0)
LYMPHS ABS: 0.7 10*3/uL (ref 0.7–4.0)
Lymphocytes Relative: 12 % (ref 12–46)
Lymphocytes Relative: 13 % (ref 12–46)
Lymphs Abs: 0.6 10*3/uL — ABNORMAL LOW (ref 0.7–4.0)
MCH: 27.5 pg (ref 26.0–34.0)
MCH: 28.1 pg (ref 26.0–34.0)
MCHC: 31.3 g/dL (ref 30.0–36.0)
MCHC: 32.5 g/dL (ref 30.0–36.0)
MCV: 87.8 fL (ref 78.0–100.0)
MCV: 86.5 fL (ref 78.0–100.0)
MONO ABS: 0.5 10*3/uL (ref 0.1–1.0)
MONOS PCT: 10 % (ref 3–12)
Monocytes Absolute: 0.6 10*3/uL (ref 0.1–1.0)
Monocytes Relative: 11 % (ref 3–12)
Neutro Abs: 3.8 10*3/uL (ref 1.7–7.7)
Neutro Abs: 3.7 10*3/uL (ref 1.7–7.7)
Neutrophils Relative %: 72 % (ref 43–77)
Neutrophils Relative %: 69 % (ref 43–77)
PLATELETS: 211 10*3/uL (ref 150–400)
Platelets: 222 10*3/uL (ref 150–400)
RBC: 2.55 MIL/uL — AB (ref 4.22–5.81)
RBC: 2.88 MIL/uL — ABNORMAL LOW (ref 4.22–5.81)
RDW: 18.6 % — ABNORMAL HIGH (ref 11.5–15.5)
RDW: 18.4 % — AB (ref 11.5–15.5)
WBC: 5.3 10*3/uL (ref 4.0–10.5)
WBC: 5.3 10*3/uL (ref 4.0–10.5)

## 2014-09-26 LAB — BASIC METABOLIC PANEL
Anion gap: 12 (ref 5–15)
BUN: 41 mg/dL — ABNORMAL HIGH (ref 6–23)
CHLORIDE: 96 meq/L (ref 96–112)
CO2: 27 mmol/L (ref 19–32)
Calcium: 8.4 mg/dL (ref 8.4–10.5)
Creatinine, Ser: 13.66 mg/dL — ABNORMAL HIGH (ref 0.50–1.35)
GFR calc Af Amer: 3 mL/min — ABNORMAL LOW (ref >90)
GFR calc non Af Amer: 3 mL/min — ABNORMAL LOW (ref >90)
GLUCOSE: 121 mg/dL — AB (ref 70–99)
Potassium: 3.6 mmol/L (ref 3.5–5.1)
Sodium: 135 mmol/L (ref 135–145)

## 2014-09-26 LAB — PREPARE RBC (CROSSMATCH)

## 2014-09-26 SURGERY — COLONOSCOPY
Anesthesia: Moderate Sedation | Laterality: Left

## 2014-09-26 MED ORDER — MIDAZOLAM HCL 5 MG/ML IJ SOLN
INTRAMUSCULAR | Status: AC
Start: 2014-09-26 — End: 2014-09-26
  Filled 2014-09-26: qty 2

## 2014-09-26 MED ORDER — ALTEPLASE 2 MG IJ SOLR: 2.0000 mg | Freq: Once | INTRAMUSCULAR | Status: DC | PRN

## 2014-09-26 MED ORDER — SODIUM CHLORIDE 0.9 % IV SOLN: 100.0000 mL | INTRAVENOUS | Status: DC | PRN

## 2014-09-26 MED ORDER — MIDAZOLAM HCL 5 MG/5ML IJ SOLN
INTRAMUSCULAR | Status: DC | PRN
  Administered 2014-09-26 (×4): 1 mg via INTRAVENOUS
  Administered 2014-09-26: 2 mg via INTRAVENOUS

## 2014-09-26 MED ORDER — DIPHENHYDRAMINE HCL 50 MG/ML IJ SOLN
INTRAMUSCULAR | Status: AC
  Filled 2014-09-26: qty 1

## 2014-09-26 MED ORDER — FENTANYL CITRATE 0.05 MG/ML IJ SOLN
INTRAMUSCULAR | Status: AC
  Filled 2014-09-26: qty 2

## 2014-09-26 MED ORDER — FENTANYL CITRATE 0.05 MG/ML IJ SOLN
INTRAMUSCULAR | Status: DC | PRN
  Administered 2014-09-26 (×2): 25 ug via INTRAVENOUS

## 2014-09-26 MED ORDER — SODIUM CHLORIDE 0.9 % IV SOLN
Freq: Once | INTRAVENOUS | Status: AC
  Administered 2014-09-26: 11:00:00 via INTRAVENOUS

## 2014-09-26 NOTE — Progress Notes (Signed)
Pt gone down via bed for coloscopy.  Blood infusing @ this time.

## 2014-09-26 NOTE — Interval H&P Note (Signed)
History and Physical Interval Note:  09/26/2014 4:08 PM  Randy [name]  has presented today for surgery, with the diagnosis of rectal bleeding  The various methods of treatment have been discussed with the patient and family. After consideration of risks, benefits and other options for treatment, the patient has consented to  Procedure(s): COLONOSCOPY (Left) as a surgical intervention .  [name] history has been reviewed, patient examined, no change in status, stable for surgery.  I have reviewed the patient's chart and labs.  Questions were answered to the patient's satisfaction.     Tahirih Lair,Adem M  Assessment:   1.  Hematochezia.  Plan:  1.  Colonoscopy. 2.  Risks (bleeding, infection, bowel perforation that could require surgery, sedation-related changes in cardiopulmonary systems), benefits (identification and possible treatment of source of symptoms, exclusion of certain causes of symptoms), and alternatives (watchful waiting, radiographic imaging studies, empiric medical treatment) of colonoscopy were explained to patient/family in detail and patient wishes to proceed.

## 2014-09-26 NOTE — Op Note (Signed)
[facility] [place], [id number]   COLONOSCOPY PROCEDURE REPORT  PATIENT: Randy [name], Randy [name]  MR#: [id number] BIRTHDATE: 1932/08/07 , 82  yrs. old GENDER: male ENDOSCOPIST: Arta Silence, MD REFERRED QT:[id number] PROCEDURE DATE:  2014/10/26 PROCEDURE:   Colonoscopy with biopsy ASA CLASS:   Class III INDICATIONS:hematochezia. MEDICATIONS: Fentanyl 50 mcg IV and Versed 6 mg IV  DESCRIPTION OF PROCEDURE:   After the risks benefits and alternatives of the procedure were thoroughly explained, informed consent was obtained.  revealed no abnormalities of the rectum. The pediatric colonoscope was introduced through the anus and advanced to the cecum, which was identified by both the appendix and ileocecal valve. No adverse events experienced.   The quality of the prep was fair.  The instrument was then slowly withdrawn as the colon was fully examined.    Findings:  Diminished anal sphincter tone, otherwise normal digital rectal exam.  Old and some semi-fresh flood was seen scattered throughout the colon, from the rectum to the cecum.  Lavage was done as best possible, but lesions could have been missed and this exam was not adequate for polyp screening.  Several medium-sized diverticula were seen, most prominently in the sigmoid colon and the ascending colon.  Some diverticula had adhesive clot on top, but no obvious focal bleeding site was seen.  There was no blood in the terminal ileum.  Several polyps were seen in the cecum, ascending colon and proximal transverse colon; biopsies were obtained with cold forceps.  Retroflexed view of rectum was normal.           Withdrawal time was   .  The scope was withdrawn and the procedure completed.  COMPLICATIONS:  ENDOSCOPIC IMPRESSION:     As above.  Suspect bleeding diverticular in origin, with evidence of recent bleeding.  RECOMMENDATIONS:     1.  Watch for potential complications  of procedure. 2.  Follow CBCs and clinically. 3.  If patient has persistent rebleeding, would do another tagged RBC scan which, if positive, should be immediately followed by mesenteric angiogram for possible angioembolization. 4.  Await biopsy results. 5.  Clear liquids only; do not further advance until we revisit tomorrow. 6.  [facility] will follow.  eSigned:  Arta Silence, MD October 26, 2014 5:08 PM   cc:  CPT CODES: ICD CODES:  The ICD and CPT codes recommended by this software are interpretations from the data that the clinical staff has captured with the software.  The verification of the translation of this report to the ICD and CPT codes and modifiers is the sole responsibility of the health care institution and practicing physician where this report was generated.  [employer] will not be held responsible for the validity of the ICD and CPT codes included on this report.  AMA assumes no liability for data contained or not contained herein. CPT is a Designer, television/film set of the [employer].

## 2014-09-26 NOTE — Progress Notes (Signed)
Subjective:   Feels weak, says has blood in stool again this AM  Objective Filed Vitals:   09/25/14 0938 09/25/14 1750 09/25/14 2148 09/26/14 0637  BP: 121/60 119/57 135/58 115/58  Pulse: 86 76 74 69  Temp: 98.1 F (36.7 C) 98.8 F (37.1 C) 98.3 F (36.8 C)   TempSrc: Oral Oral Oral   Resp: 18 18 18 18   Height:      Weight:   69.3 kg (152 lb 12.5 oz)   SpO2: 99% 97% 97% 99%   Physical Exam General: alert and oriented. No acute distress.  Heart:  RRR. 2/6 systolic murmur Lungs: CTA, unlabored  Abdomen: soft, nontender +BS. PD cath  Extremities: no edema Dialysis Access: PD cath  Assessment/Plan: 1. GIB- s/p 2u RBC 12/31, hgb dropping, now down to 7, from 7.5/  yesterday. Management per primary/ GI following- transfusion pending today. For colonoscopy today had no active bleeding on nuclear med scan 2. ESRD - PD K+3.6 3. Anemia - hgb 7- watch CBC daily/transfusion pending today. Cont ESA 4. Secondary hyperparathyroidism - Ca+8.4. Last phos 6.8. Cont calcitriol and renvela 5. HTN/volume - 115/58. No volume excess  6. Nutrition -NPO.  renal vit  Shelle Iron, NP [place] [phone] 09/26/2014,10:06 AM  LOS: 5 days    Additional Objective Labs: Basic Metabolic Panel:  Recent Labs Lab 09/22/14 0225  09/24/14 0545 09/25/14 0533 09/26/14 0515  NA 136  < > 135 135 135  K 4.2  < > 4.1 3.6 3.6  CL 101  < > 98 98 96  CO2 22  < > 20 26 27   GLUCOSE 160*  < > 118* 115* 121*  BUN 57*  < > 54* 47* 41*  CREATININE 14.67*  < > 14.04* 13.66* 13.66*  CALCIUM 8.4  < > 8.7 8.5 8.4  PHOS 6.8*  --   --   --   --   < > = values in this interval not displayed. Liver Function Tests: No results for input(s): AST, ALT, ALKPHOS, BILITOT, PROT, ALBUMIN in the last 168 hours. No results for input(s): LIPASE, AMYLASE in the last 168 hours. CBC:  Recent Labs Lab 09/22/14 0225  09/22/14 0745  09/24/14 0545 09/25/14 0533 09/26/14 0515  WBC 9.7  --  7.9  --   8.2 5.8 5.3  NEUTROABS  --   --   --   --  6.4 4.2 3.8  HGB 7.0*  < > 7.9*  < > 7.6* 7.5* 7.0*  HCT 22.5*  < > 24.7*  < > 23.8* 23.8* 22.4*  MCV 90.0  --  86.7  --  88.1 87.2 87.8  PLT 219  --  198  --  226 204 222  < > = values in this interval not displayed. Blood Culture    Component Value Date/Time   SDES FLUID PLEURAL 09/30/2012 1347   SDES FLUID PLEURAL 09/30/2012 1347   SPECREQUEST NONE 09/30/2012 1347   SPECREQUEST NONE 09/30/2012 1347   CULT NO ACID FAST BACILLI ISOLATED IN 6 WEEKS 09/30/2012 1347   CULT NO GROWTH 3 DAYS 09/30/2012 1347   REPTSTATUS 11/13/2012 FINAL 09/30/2012 1347   REPTSTATUS 10/04/2012 FINAL 09/30/2012 1347    Cardiac Enzymes:  Recent Labs Lab 09/22/14 0229  TROPONINI 0.10*   CBG:  Recent Labs Lab 09/22/14 0022 09/22/14 1522  GLUCAP 158* 115*   Iron Studies: No results for input(s): IRON, TIBC, TRANSFERRIN, FERRITIN in the last 72 hours. @lablastinr3 @ Studies/Results: Nm Gi Blood Loss  09/24/2014   CLINICAL DATA:  Hematochezia, PT Hx of Hypertensive Heart Disease, Hypercholesterolemia, Secondary Cardiomyopathy, Anemia, HTN, Peripheral Vascular Disease, ESRD (on peritoneal dialysis) Pericarditis, NSTEMI  EXAM: NUCLEAR MEDICINE GASTROINTESTINAL BLEEDING SCAN  TECHNIQUE: Sequential abdominal images were obtained following intravenous administration of Tc-6m labeled red blood cells.  RADIOPHARMACEUTICALS:  25.0 mCi Tc-79m in-vitro labeled red cells.  COMPARISON:  None.  FINDINGS: Physiologic distribution of radiopharmaceutical. No evidence of active extravasation.  IMPRESSION: Negative   Electronically Signed   By: Arne Cleveland M.D.   On: 09/24/2014 14:40   Medications: . sodium chloride 10 mL/hr at 09/25/14 1302  . dialysis solution 1.5% low-MG/low-CA    . dialysis solution 2.5% low-MG/low-CA     . sodium chloride   Intravenous Once  . amLODipine  10 mg Oral Daily  . calcitRIOL  0.5 mcg Oral Daily  . [START ON 09/30/2014] cloNIDine  0.1 mg  Transdermal Weekly  . hydrALAZINE  25 mg Oral BID  . labetalol  200 mg Oral BID  . lisinopril  10 mg Oral Daily  . multivitamin  1 tablet Oral QHS  . pantoprazole  40 mg Oral QHS  . polyethylene glycol  17 g Oral TID  . rOPINIRole  0.5 mg Oral QHS  . sevelamer carbonate  1,600 mg Oral TID WC

## 2014-09-26 NOTE — Progress Notes (Signed)
Pt back from Endo, blood documentation not complete, called endo left msg for Elna Breslow to return call to ext [phone] or [phone].

## 2014-09-26 NOTE — H&P (View-Only) (Signed)
PROGRESS NOTE    SOU NOHR UXN:[id number] DOB: March 25, 1932 DOA: 09/21/2014 PCP: [name], MD  HPI/Brief narrative 81 year old male with history of ESRD on PD, last colonoscopy >10 years ago, recently admitted 08/17/14 for chest pain and elevated troponins at which time he underwent diagnostic LHC that showed mild-to-moderate diffuse CAD and it was felt that his presentation was more likely due to myopericarditis in the setting of inadequate renal dialysis. He was treated with high dose aspirin, colchicine, prednisone and PPI. He then had an admission on 12/7 for acute hypoxic respiratory failure secondary to fluid overload, he now presented to [facility] on 12/30 with several episodes of bright red bleeding per rectum. He was transferred to [facility] and GI was consulted. Nephrology on board for dialysis needs.   Assessment/Plan:  1. Acute lower GI bleed: Suspected diverticular bleed. Negative bleeding scan. GI input and follow-up appreciated: Plan for screening colonoscopy 09/26/14. Patient had several BMs following bowel prep overnight for colonoscopy today and had some blood during last BM. 2. Acute post hemorrhagic anemia: Complicating underlying chronic anemia. Secondary to GI bleed. Aranesp per nephrology. S/P 2 units PRBC's 12/31. Hb has dropped again from 7.5>7. Transfuse 1 unit PRBC and follow up CBC. 3. ESRD on PD: Management per nephrology. 4. Essential hypertension: Controlled. Due to soft blood pressures 1/3, clonidine reduced from 0.2  to 0.1 transdermally weekly, hydralazine from 25 MG 3 times a day to twice a day and labetalol from 400 MG to 200 MG twice a day and continued on lisinopril 10 MG daily. Blood pressures better. 5. Cardiomyopathy/recent uremic myopericarditis: Last echo with LVEF 40-45 percent. Aspirin on hold secondary to GI bleed. Minimally elevated troponin of 0.10-likely trending down from recent episode of myopericarditis. No reported chest pain. No  further workup.    Code Status: Full Family Communication: None at bedside Disposition Plan: Home when medically stable.   Consultants:  GI  Nephrology  Procedures:  PD  Antibiotics:  None   Subjective: Patient had a brown BM on 1/3 AM. He had several BMs overnight following bowel prep for colonoscopy in the last one had some dark red blood. Denies abdominal pain, dizziness, lightheadedness, dyspnea or chest pain.  Objective: Filed Vitals:   09/26/14 0637 09/26/14 1102 09/26/14 1122 09/26/14 1150  BP: 115/58 129/55 129/55 103/55  Pulse: 69 74 74 76  Temp:  98.4 F (36.9 C) 98.4 F (36.9 C) 98.4 F (36.9 C)  TempSrc:  Oral Oral Oral  Resp: 18 16 16 16   Height:      Weight:      SpO2: 99% 100% 100% 96%    Intake/Output Summary (Last 24 hours) at 09/26/14 1317 Last data filed at 09/26/14 0230  Gross per 24 hour  Intake   1920 ml  Output      0 ml  Net   1920 ml   Filed Weights   09/23/14 2148 09/25/14 0529 09/25/14 2148  Weight: 74.3 kg (163 lb 12.8 oz) 70.4 kg (155 lb 3.3 oz) 69.3 kg (152 lb 12.5 oz)     Exam:  General exam:pleasant elderly male lying comfortably in bed. Respiratory system: Clear. No increased work of breathing. Cardiovascular system: S1 & S2 heard, RRR. No JVD, murmurs, gallops, clicks or pedal edema. Telemetry: Sinus rhythm.  Gastrointestinal system: Abdomen is nondistended, soft and nontender. Normal bowel sounds heard. Central nervous system: Alert and oriented. No focal neurological deficits. Extremities: Symmetric 5 x 5 power.   Data Reviewed: Basic  Metabolic Panel:  Recent Labs Lab 09/22/14 0225 09/23/14 0218 09/24/14 0545 09/25/14 0533 09/26/14 0515  NA 136 136 135 135 135  K 4.2 4.5 4.1 3.6 3.6  CL 101 99 98 98 96  CO2 22 20 20 26 27   GLUCOSE 160* 156* 118* 115* 121*  BUN 57* 65* 54* 47* 41*  CREATININE 14.67* 15.97* 14.04* 13.66* 13.66*  CALCIUM 8.4 8.9 8.7 8.5 8.4  MG 2.1  --   --   --   --   PHOS 6.8*  --    --   --   --    Liver Function Tests: No results for input(s): AST, ALT, ALKPHOS, BILITOT, PROT, ALBUMIN in the last 168 hours. No results for input(s): LIPASE, AMYLASE in the last 168 hours. No results for input(s): AMMONIA in the last 168 hours. CBC:  Recent Labs Lab 09/21/14 2140 09/22/14 0225  09/22/14 0745  09/23/14 0218 09/23/14 0940 09/24/14 0545 09/25/14 0533 09/26/14 0515  WBC 10.3 9.7  --  7.9  --   --   --  8.2 5.8 5.3  NEUTROABS 8.5*  --   --   --   --   --   --  6.4 4.2 3.8  HGB 6.7* 7.0*  < > 7.9*  < > 7.5* 8.5* 7.6* 7.5* 7.0*  HCT 21.4* 22.5*  < > 24.7*  < > 23.4* 26.2* 23.8* 23.8* 22.4*  MCV 97.3 90.0  --  86.7  --   --   --  88.1 87.2 87.8  PLT 272 219  --  198  --   --   --  226 204 222  < > = values in this interval not displayed. Cardiac Enzymes:  Recent Labs Lab 09/22/14 0229  TROPONINI 0.10*   BNP (last 3 results)  Recent Labs  06/21/14 1046 08/17/14 0625 08/29/14 0928  PROBNP >70000.0* >70000.0* >70000.0*   CBG:  Recent Labs Lab 09/22/14 0022 09/22/14 1522  GLUCAP 158* 115*    No results found for this or any previous visit (from the past 240 hour(s)).       Studies: Nm Gi Blood Loss  09/24/2014   CLINICAL DATA:  Hematochezia, PT Hx of Hypertensive Heart Disease, Hypercholesterolemia, Secondary Cardiomyopathy, Anemia, HTN, Peripheral Vascular Disease, ESRD (on peritoneal dialysis) Pericarditis, NSTEMI  EXAM: NUCLEAR MEDICINE GASTROINTESTINAL BLEEDING SCAN  TECHNIQUE: Sequential abdominal images were obtained following intravenous administration of Tc-55m labeled red blood cells.  RADIOPHARMACEUTICALS:  25.0 mCi Tc-43m in-vitro labeled red cells.  COMPARISON:  None.  FINDINGS: Physiologic distribution of radiopharmaceutical. No evidence of active extravasation.  IMPRESSION: Negative   Electronically Signed   By: Arne Cleveland M.D.   On: 09/24/2014 14:40        Scheduled Meds: . amLODipine  10 mg Oral Daily  . calcitRIOL  0.5  mcg Oral Daily  . [START ON 09/30/2014] cloNIDine  0.1 mg Transdermal Weekly  . hydrALAZINE  25 mg Oral BID  . labetalol  200 mg Oral BID  . lisinopril  10 mg Oral Daily  . multivitamin  1 tablet Oral QHS  . pantoprazole  40 mg Oral QHS  . polyethylene glycol  17 g Oral TID  . rOPINIRole  0.5 mg Oral QHS  . sevelamer carbonate  1,600 mg Oral TID WC   Continuous Infusions: . sodium chloride 10 mL/hr at 09/25/14 1302  . dialysis solution 1.5% low-MG/low-CA    . dialysis solution 2.5% low-MG/low-CA      Active Problems:  End stage renal disease, has been peritoneal, now hemodialysis   Lower GI bleed   ESRD (end stage renal disease)   Blood loss anemia    Time spent: 30 minutes.    Vernell Leep, MD, FACP, FHM. [facility] Pager [phone]  If 7PM-7AM, please contact night-coverage [email or web address] Password [email or web address] 09/26/2014, 1:17 PM    LOS: 5 days

## 2014-09-26 NOTE — Progress Notes (Signed)
1 Unit PRBC's started, IV right hand infiltrated, IV team consult STAT for blood transfusing.

## 2014-09-26 NOTE — Progress Notes (Signed)
PROGRESS NOTE    LASTER APPLING QVZ:[id number] DOB: 1932/02/16 DOA: 09/21/2014 PCP: [name], MD  HPI/Brief narrative 81 year old male with history of ESRD on PD, last colonoscopy >10 years ago, recently admitted 08/17/14 for chest pain and elevated troponins at which time he underwent diagnostic LHC that showed mild-to-moderate diffuse CAD and it was felt that his presentation was more likely due to myopericarditis in the setting of inadequate renal dialysis. He was treated with high dose aspirin, colchicine, prednisone and PPI. He then had an admission on 12/7 for acute hypoxic respiratory failure secondary to fluid overload, he now presented to [facility] on 12/30 with several episodes of bright red bleeding per rectum. He was transferred to [facility] and GI was consulted. Nephrology on board for dialysis needs.   Assessment/Plan:  1. Acute lower GI bleed: Suspected diverticular bleed. Negative bleeding scan. GI input and follow-up appreciated: Plan for screening colonoscopy 09/26/14. Patient had several BMs following bowel prep overnight for colonoscopy today and had some blood during last BM. 2. Acute post hemorrhagic anemia: Complicating underlying chronic anemia. Secondary to GI bleed. Aranesp per nephrology. S/P 2 units PRBC's 12/31. Hb has dropped again from 7.5>7. Transfuse 1 unit PRBC and follow up CBC. 3. ESRD on PD: Management per nephrology. 4. Essential hypertension: Controlled. Due to soft blood pressures 1/3, clonidine reduced from 0.2  to 0.1 transdermally weekly, hydralazine from 25 MG 3 times a day to twice a day and labetalol from 400 MG to 200 MG twice a day and continued on lisinopril 10 MG daily. Blood pressures better. 5. Cardiomyopathy/recent uremic myopericarditis: Last echo with LVEF 40-45 percent. Aspirin on hold secondary to GI bleed. Minimally elevated troponin of 0.10-likely trending down from recent episode of myopericarditis. No reported chest pain. No  further workup.    Code Status: Full Family Communication: None at bedside Disposition Plan: Home when medically stable.   Consultants:  GI  Nephrology  Procedures:  PD  Antibiotics:  None   Subjective: Patient had a brown BM on 1/3 AM. He had several BMs overnight following bowel prep for colonoscopy in the last one had some dark red blood. Denies abdominal pain, dizziness, lightheadedness, dyspnea or chest pain.  Objective: Filed Vitals:   09/26/14 0637 09/26/14 1102 09/26/14 1122 09/26/14 1150  BP: 115/58 129/55 129/55 103/55  Pulse: 69 74 74 76  Temp:  98.4 F (36.9 C) 98.4 F (36.9 C) 98.4 F (36.9 C)  TempSrc:  Oral Oral Oral  Resp: 18 16 16 16   Height:      Weight:      SpO2: 99% 100% 100% 96%    Intake/Output Summary (Last 24 hours) at 09/26/14 1317 Last data filed at 09/26/14 0230  Gross per 24 hour  Intake   1920 ml  Output      0 ml  Net   1920 ml   Filed Weights   09/23/14 2148 09/25/14 0529 09/25/14 2148  Weight: 74.3 kg (163 lb 12.8 oz) 70.4 kg (155 lb 3.3 oz) 69.3 kg (152 lb 12.5 oz)     Exam:  General exam:pleasant elderly male lying comfortably in bed. Respiratory system: Clear. No increased work of breathing. Cardiovascular system: S1 & S2 heard, RRR. No JVD, murmurs, gallops, clicks or pedal edema. Telemetry: Sinus rhythm.  Gastrointestinal system: Abdomen is nondistended, soft and nontender. Normal bowel sounds heard. Central nervous system: Alert and oriented. No focal neurological deficits. Extremities: Symmetric 5 x 5 power.   Data Reviewed: Basic  Metabolic Panel:  Recent Labs Lab 09/22/14 0225 09/23/14 0218 09/24/14 0545 09/25/14 0533 09/26/14 0515  NA 136 136 135 135 135  K 4.2 4.5 4.1 3.6 3.6  CL 101 99 98 98 96  CO2 22 20 20 26 27   GLUCOSE 160* 156* 118* 115* 121*  BUN 57* 65* 54* 47* 41*  CREATININE 14.67* 15.97* 14.04* 13.66* 13.66*  CALCIUM 8.4 8.9 8.7 8.5 8.4  MG 2.1  --   --   --   --   PHOS 6.8*  --    --   --   --    Liver Function Tests: No results for input(s): AST, ALT, ALKPHOS, BILITOT, PROT, ALBUMIN in the last 168 hours. No results for input(s): LIPASE, AMYLASE in the last 168 hours. No results for input(s): AMMONIA in the last 168 hours. CBC:  Recent Labs Lab 09/21/14 2140 09/22/14 0225  09/22/14 0745  09/23/14 0218 09/23/14 0940 09/24/14 0545 09/25/14 0533 09/26/14 0515  WBC 10.3 9.7  --  7.9  --   --   --  8.2 5.8 5.3  NEUTROABS 8.5*  --   --   --   --   --   --  6.4 4.2 3.8  HGB 6.7* 7.0*  < > 7.9*  < > 7.5* 8.5* 7.6* 7.5* 7.0*  HCT 21.4* 22.5*  < > 24.7*  < > 23.4* 26.2* 23.8* 23.8* 22.4*  MCV 97.3 90.0  --  86.7  --   --   --  88.1 87.2 87.8  PLT 272 219  --  198  --   --   --  226 204 222  < > = values in this interval not displayed. Cardiac Enzymes:  Recent Labs Lab 09/22/14 0229  TROPONINI 0.10*   BNP (last 3 results)  Recent Labs  06/21/14 1046 08/17/14 0625 08/29/14 0928  PROBNP >70000.0* >70000.0* >70000.0*   CBG:  Recent Labs Lab 09/22/14 0022 09/22/14 1522  GLUCAP 158* 115*    No results found for this or any previous visit (from the past 240 hour(s)).       Studies: Nm Gi Blood Loss  09/24/2014   CLINICAL DATA:  Hematochezia, PT Hx of Hypertensive Heart Disease, Hypercholesterolemia, Secondary Cardiomyopathy, Anemia, HTN, Peripheral Vascular Disease, ESRD (on peritoneal dialysis) Pericarditis, NSTEMI  EXAM: NUCLEAR MEDICINE GASTROINTESTINAL BLEEDING SCAN  TECHNIQUE: Sequential abdominal images were obtained following intravenous administration of Tc-89m labeled red blood cells.  RADIOPHARMACEUTICALS:  25.0 mCi Tc-22m in-vitro labeled red cells.  COMPARISON:  None.  FINDINGS: Physiologic distribution of radiopharmaceutical. No evidence of active extravasation.  IMPRESSION: Negative   Electronically Signed   By: Arne Cleveland M.D.   On: 09/24/2014 14:40        Scheduled Meds: . amLODipine  10 mg Oral Daily  . calcitRIOL  0.5  mcg Oral Daily  . [START ON 09/30/2014] cloNIDine  0.1 mg Transdermal Weekly  . hydrALAZINE  25 mg Oral BID  . labetalol  200 mg Oral BID  . lisinopril  10 mg Oral Daily  . multivitamin  1 tablet Oral QHS  . pantoprazole  40 mg Oral QHS  . polyethylene glycol  17 g Oral TID  . rOPINIRole  0.5 mg Oral QHS  . sevelamer carbonate  1,600 mg Oral TID WC   Continuous Infusions: . sodium chloride 10 mL/hr at 09/25/14 1302  . dialysis solution 1.5% low-MG/low-CA    . dialysis solution 2.5% low-MG/low-CA      Active Problems:  End stage renal disease, has been peritoneal, now hemodialysis   Lower GI bleed   ESRD (end stage renal disease)   Blood loss anemia    Time spent: 30 minutes.    Vernell Leep, MD, FACP, FHM. [facility] Pager [phone]  If 7PM-7AM, please contact night-coverage [email or web address] Password [email or web address] 09/26/2014, 1:17 PM    LOS: 5 days

## 2014-09-27 ENCOUNTER — Encounter (HOSPITAL_COMMUNITY): Payer: Self-pay | Admitting: Gastroenterology

## 2014-09-27 LAB — BASIC METABOLIC PANEL
Anion gap: 13 (ref 5–15)
BUN: 37 mg/dL — AB (ref 6–23)
CHLORIDE: 100 meq/L (ref 96–112)
CO2: 24 mmol/L (ref 19–32)
Calcium: 8.7 mg/dL (ref 8.4–10.5)
Creatinine, Ser: 13.65 mg/dL — ABNORMAL HIGH (ref 0.50–1.35)
GFR calc Af Amer: 3 mL/min — ABNORMAL LOW (ref >90)
GFR calc non Af Amer: 3 mL/min — ABNORMAL LOW (ref >90)
GLUCOSE: 121 mg/dL — AB (ref 70–99)
POTASSIUM: 3.7 mmol/L (ref 3.5–5.1)
Sodium: 137 mmol/L (ref 135–145)

## 2014-09-27 LAB — CBC WITH DIFFERENTIAL/PLATELET
BASOS ABS: 0 10*3/uL (ref 0.0–0.1)
Basophils Relative: 0 % (ref 0–1)
EOS PCT: 6 % — AB (ref 0–5)
Eosinophils Absolute: 0.3 10*3/uL (ref 0.0–0.7)
HEMATOCRIT: 25.7 % — AB (ref 39.0–52.0)
Hemoglobin: 8.4 g/dL — ABNORMAL LOW (ref 13.0–17.0)
LYMPHS ABS: 0.6 10*3/uL — AB (ref 0.7–4.0)
LYMPHS PCT: 12 % (ref 12–46)
MCH: 29 pg (ref 26.0–34.0)
MCHC: 32.7 g/dL (ref 30.0–36.0)
MCV: 88.6 fL (ref 78.0–100.0)
Monocytes Absolute: 0.5 10*3/uL (ref 0.1–1.0)
Monocytes Relative: 9 % (ref 3–12)
NEUTROS ABS: 3.7 10*3/uL (ref 1.7–7.7)
Neutrophils Relative %: 73 % (ref 43–77)
PLATELETS: 234 10*3/uL (ref 150–400)
RBC: 2.9 MIL/uL — ABNORMAL LOW (ref 4.22–5.81)
RDW: 18.7 % — AB (ref 11.5–15.5)
WBC: 5.1 10*3/uL (ref 4.0–10.5)

## 2014-09-27 MED ORDER — DARBEPOETIN ALFA 100 MCG/0.5ML IJ SOSY: 100.0000 ug | PREFILLED_SYRINGE | INTRAMUSCULAR | Status: DC

## 2014-09-27 NOTE — Progress Notes (Signed)
Subjective:    Patient states he feels significantly better. Denies weakness, dizziness, or rectal bleeding. No BM today. Denies HA, SOB, Chest pain, dysuria and admits to a good appetite. Only complaint is not having a full diet.    Objective Filed Vitals:   09/26/14 1700 09/26/14 1751 09/26/14 2040 09/27/14 0450  BP: 109/52 134/77 99/48 124/57  Pulse: 71 72 67 77  Temp:  97.9 F (36.6 C) 98.4 F (36.9 C) 98.1 F (36.7 C)  TempSrc:  Oral Oral Oral  Resp: 12 15 16 18   Height:      Weight:      SpO2: 97% 97% 97% 98%   Physical Exam General: alert and oriented. No acute distress Heart: RRR, 2/6 systolic murmur  Lungs: CTA, unlabored  Abdomen: soft, non tender, non distended, + BS, PD cath.  Extremities: No edema, equal pedal pulses bilaterally  Dialysis Access: PD cath, dressing dry and without discharge   Assessment/Plan: 1. GIB - s/p 2 U RBC 12/31, hgb 8.4 from 7 yesterday s/p 1 u RBC. Colonoscopy results support diverticular as origin of bleeding. No focal bleed noted. If bleeding restarts a tagged RBC scan will be performed. F/U outpt with [facility].  2. ESRD - PD, K 3.7  3. Anemia - Hgb 8.4 cont weekly ESA 4. Secondary hyperparathyroidism - Ca 8.7, calcitriol, renvela  5. HTN/volume - Restart norvasc,  Hydralazine, labetalol, lisinopril, clonidine as BP tolerates, BP 112/63 6. Nutrition - multivitamin, diet clear liquids   Shelle Iron, NP [employer] [phone] 09/27/2014,9:32 AM  LOS: 6 days    Additional Objective Labs: Basic Metabolic Panel:  Recent Labs Lab 09/22/14 0225  09/25/14 0533 09/26/14 0515 09/27/14 0530  NA 136  < > 135 135 137  K 4.2  < > 3.6 3.6 3.7  CL 101  < > 98 96 100  CO2 22  < > 26 27 24   GLUCOSE 160*  < > 115* 121* 121*  BUN 57*  < > 47* 41* 37*  CREATININE 14.67*  < > 13.66* 13.66* 13.65*  CALCIUM 8.4  < > 8.5 8.4 8.7  PHOS 6.8*  --   --   --   --   < > = values in this interval not displayed. Liver Function  Tests: No results for input(s): AST, ALT, ALKPHOS, BILITOT, PROT, ALBUMIN in the last 168 hours. No results for input(s): LIPASE, AMYLASE in the last 168 hours. CBC:  Recent Labs Lab 09/24/14 0545 09/25/14 0533 09/26/14 0515 09/26/14 1909 09/27/14 0530  WBC 8.2 5.8 5.3 5.3 5.1  NEUTROABS 6.4 4.2 3.8 3.7 3.7  HGB 7.6* 7.5* 7.0* 8.1* 8.4*  HCT 23.8* 23.8* 22.4* 24.9* 25.7*  MCV 88.1 87.2 87.8 86.5 88.6  PLT 226 204 222 211 234   Blood Culture    Component Value Date/Time   SDES FLUID PLEURAL 09/30/2012 1347   SDES FLUID PLEURAL 09/30/2012 1347   SPECREQUEST NONE 09/30/2012 1347   SPECREQUEST NONE 09/30/2012 1347   CULT NO ACID FAST BACILLI ISOLATED IN 6 WEEKS 09/30/2012 1347   CULT NO GROWTH 3 DAYS 09/30/2012 1347   REPTSTATUS 11/13/2012 FINAL 09/30/2012 1347   REPTSTATUS 10/04/2012 FINAL 09/30/2012 1347    Cardiac Enzymes:  Recent Labs Lab 09/22/14 0229  TROPONINI 0.10*   CBG:  Recent Labs Lab 09/22/14 0022 09/22/14 1522  GLUCAP 158* 115*   Iron Studies: No results for input(s): IRON, TIBC, TRANSFERRIN, FERRITIN in the last 72 hours. @lablastinr3 @ Studies/Results: No results  found. Medications: . sodium chloride 10 mL/hr at 09/25/14 1302  . dialysis solution 1.5% low-MG/low-CA    . dialysis solution 2.5% low-MG/low-CA     . amLODipine  10 mg Oral Daily  . calcitRIOL  0.5 mcg Oral Daily  . [START ON 09/30/2014] cloNIDine  0.1 mg Transdermal Weekly  . hydrALAZINE  25 mg Oral BID  . labetalol  200 mg Oral BID  . lisinopril  10 mg Oral Daily  . multivitamin  1 tablet Oral QHS  . pantoprazole  40 mg Oral QHS  . polyethylene glycol  17 g Oral TID  . rOPINIRole  0.5 mg Oral QHS  . sevelamer carbonate  1,600 mg Oral TID WC

## 2014-09-27 NOTE — Care Management Note (Signed)
CARE MANAGEMENT NOTE 09/27/2014  Patient:  Randy [name], Randy [name]   Account Number:  [id number]  Date Initiated:  09/22/2014  Documentation initiated by:  Elissa Hefty  Subjective/Objective Assessment:   adm w gi bleed     Action/Plan:   lives w wife, pcp dr Timmothy Sours [place] Graff   Anticipated DC Date:  09/29/2014   Anticipated DC Plan:  [place]         Choice offered to / List presented to:             Status of service:   Medicare Important Message given?  YES (If response is "NO", the following Medicare IM given date fields will be blank) Date Medicare IM given:  09/26/2014 Medicare IM given by:  Jaiyana Canale Date Additional Medicare IM given:   Additional Medicare IM given by:    Discharge Disposition:    Per UR Regulation:  Reviewed for med. necessity/level of care/duration of stay  If discussed at [place] of Stay Meetings, dates discussed:    Comments:

## 2014-09-27 NOTE — Progress Notes (Signed)
PROGRESS NOTE    Randy [name] YYF:[id number] DOB: May 17, 1932 DOA: 09/21/2014 PCP: [name], MD  HPI/Brief narrative 81 year old male with history of ESRD on PD, last colonoscopy >10 years ago, recently admitted 08/17/14 for chest pain and elevated troponins at which time he underwent diagnostic LHC that showed mild-to-moderate diffuse CAD and it was felt that his presentation was more likely due to myopericarditis in the setting of inadequate renal dialysis. He was treated with high dose aspirin, colchicine, prednisone and PPI. He then had an admission on 12/7 for acute hypoxic respiratory failure secondary to fluid overload, he now presented to [facility] on 12/30 with several episodes of bright red bleeding per rectum. He was transferred to [facility] and GI was consulted. Nephrology on board for dialysis needs.   Assessment/Plan:  1. Acute lower GI bleed: Suspected diverticular bleed. Negative bleeding scan. Status post colonoscopy 1/4 confirming diverticular bleed. Improving. GI following. Remains on clear liquids-diet advancement per GI. 2. Acute post hemorrhagic anemia: Complicating underlying chronic anemia. Secondary to GI bleed. Aranesp per nephrology. S/P 2 units PRBC's 12/31. Hb has dropped again from 7.5>7 on 1/4. S/P 1 unit PRBC on 1/4. Hemoglobin improved and stable over the last 12 hours. Follow CBC in a.m. 3. ESRD on PD: Management per nephrology. 4. Essential hypertension: Controlled. Due to soft blood pressures 1/3, clonidine reduced from 0.2  to 0.1 transdermally weekly, hydralazine from 25 MG 3 times a day to twice a day and labetalol from 400 MG to 200 MG twice a day and continued on lisinopril 10 MG daily. On and off soft blood pressures-may need further titrating down. 5. Cardiomyopathy/recent uremic myopericarditis: Last echo with LVEF 40-45 percent. Aspirin on hold secondary to GI bleed. Minimally elevated troponin of 0.10-likely trending down from recent  episode of myopericarditis. No reported chest pain. No further workup.    Code Status: Full Family Communication: None at bedside Disposition Plan: Home when medically stable.   Consultants:  GI  Nephrology  Procedures:  PD  Colonoscopy 1/4.  Antibiotics:  None   Subjective: Patient and nursing aide reported small dark bloody BM this morning. No other complaints.  Objective: Filed Vitals:   09/26/14 2040 09/27/14 0450 09/27/14 0935 09/27/14 1006  BP: 99/48 124/57 112/63 139/70  Pulse: 67 77 77 63  Temp: 98.4 F (36.9 C) 98.1 F (36.7 C)  98 F (36.7 C)  TempSrc: Oral Oral Oral Oral  Resp: 16 18    Height:      Weight:      SpO2: 97% 98%  100%    Intake/Output Summary (Last 24 hours) at 09/27/14 1625 Last data filed at 09/27/14 1400  Gross per 24 hour  Intake    480 ml  Output      0 ml  Net    480 ml   Filed Weights   09/23/14 2148 09/25/14 0529 09/25/14 2148  Weight: 74.3 kg (163 lb 12.8 oz) 70.4 kg (155 lb 3.3 oz) 69.3 kg (152 lb 12.5 oz)     Exam:  General exam:pleasant elderly male lying comfortably in bed. Respiratory system: Clear. No increased work of breathing. Cardiovascular system: S1 & S2 heard, RRR. No JVD, murmurs, gallops, clicks or pedal edema. Telemetry: Sinus rhythm with occasional PVCs.  Gastrointestinal system: Abdomen is nondistended, soft and nontender. Normal bowel sounds heard. Central nervous system: Alert and oriented. No focal neurological deficits. Extremities: Symmetric 5 x 5 power.   Data Reviewed: Basic Metabolic Panel:  Recent Labs  Lab 09/22/14 0225 09/23/14 0218 09/24/14 0545 09/25/14 0533 09/26/14 0515 09/27/14 0530  NA 136 136 135 135 135 137  K 4.2 4.5 4.1 3.6 3.6 3.7  CL 101 99 98 98 96 100  CO2 22 20 20 26 27 24   GLUCOSE 160* 156* 118* 115* 121* 121*  BUN 57* 65* 54* 47* 41* 37*  CREATININE 14.67* 15.97* 14.04* 13.66* 13.66* 13.65*  CALCIUM 8.4 8.9 8.7 8.5 8.4 8.7  MG 2.1  --   --   --   --   --    PHOS 6.8*  --   --   --   --   --    Liver Function Tests: No results for input(s): AST, ALT, ALKPHOS, BILITOT, PROT, ALBUMIN in the last 168 hours. No results for input(s): LIPASE, AMYLASE in the last 168 hours. No results for input(s): AMMONIA in the last 168 hours. CBC:  Recent Labs Lab 09/24/14 0545 09/25/14 0533 09/26/14 0515 09/26/14 1909 09/27/14 0530  WBC 8.2 5.8 5.3 5.3 5.1  NEUTROABS 6.4 4.2 3.8 3.7 3.7  HGB 7.6* 7.5* 7.0* 8.1* 8.4*  HCT 23.8* 23.8* 22.4* 24.9* 25.7*  MCV 88.1 87.2 87.8 86.5 88.6  PLT 226 204 222 211 234   Cardiac Enzymes:  Recent Labs Lab 09/22/14 0229  TROPONINI 0.10*   BNP (last 3 results)  Recent Labs  06/21/14 1046 08/17/14 0625 08/29/14 0928  PROBNP >70000.0* >70000.0* >70000.0*   CBG:  Recent Labs Lab 09/22/14 0022 09/22/14 1522  GLUCAP 158* 115*    No results found for this or any previous visit (from the past 240 hour(s)).       Studies: No results found.      Scheduled Meds: . amLODipine  10 mg Oral Daily  . calcitRIOL  0.5 mcg Oral Daily  . [START ON 09/30/2014] cloNIDine  0.1 mg Transdermal Weekly  . [START ON 10/02/2014] darbepoetin (ARANESP) injection - DIALYSIS  100 mcg Subcutaneous Q Sun-1800  . hydrALAZINE  25 mg Oral BID  . labetalol  200 mg Oral BID  . lisinopril  10 mg Oral Daily  . multivitamin  1 tablet Oral QHS  . pantoprazole  40 mg Oral QHS  . polyethylene glycol  17 g Oral TID  . rOPINIRole  0.5 mg Oral QHS  . sevelamer carbonate  1,600 mg Oral TID WC   Continuous Infusions: . sodium chloride 10 mL/hr at 09/25/14 1302  . dialysis solution 1.5% low-MG/low-CA    . dialysis solution 2.5% low-MG/low-CA      Active Problems:   End stage renal disease, has been peritoneal, now hemodialysis   Lower GI bleed   ESRD (end stage renal disease)   Blood loss anemia    Time spent: 30 minutes.    Vernell Leep, MD, FACP, FHM. [facility] Pager [phone]  If 7PM-7AM, please  contact night-coverage [email or web address] Password [email or web address] 09/27/2014, 4:25 PM    LOS: 6 days

## 2014-09-27 NOTE — Progress Notes (Signed)
[facility] PROGRESS NOTE Subjective patient without pain. States had small bowel movement with some old blood no fresh blood.  Objective: Vital signs in last 24 hours: Temp:  [97.9 F (36.6 C)-98.4 F (36.9 C)] 98 F (36.7 C) (01/05 1006) Pulse Rate:  [63-83] 63 (01/05 1006) Resp:  [11-20] 18 (01/05 0450) BP: (84-139)/(36-77) 139/70 mmHg (01/05 1006) SpO2:  [96 %-100 %] 100 % (01/05 1006) Last BM Date: 09/27/13  Intake/Output from previous day: 01/04 0701 - 01/05 0700 In: 120 [P.O.:120] Out: 0  Intake/Output this shift:    PE: General--alert and oriented no distress  Abdomen-- soft and nontender  Lab Results:  Recent Labs  09/25/14 0533 09/26/14 0515 09/26/14 1909 09/27/14 0530  WBC 5.8 5.3 5.3 5.1  HGB 7.5* 7.0* 8.1* 8.4*  HCT 23.8* 22.4* 24.9* 25.7*  PLT 204 222 211 234   BMET  Recent Labs  09/25/14 0533 09/26/14 0515 09/27/14 0530  NA 135 135 137  K 3.6 3.6 3.7  CL 98 96 100  CO2 26 27 24   CREATININE 13.66* 13.66* 13.65*   LFT No results for input(s): PROT, AST, ALT, ALKPHOS, BILITOT, BILIDIR, IBILI in the last 72 hours. PT/INR No results for input(s): LABPROT, INR in the last 72 hours. PANCREAS No results for input(s): LIPASE in the last 72 hours.       Studies/Results: No results found.  Medications: I have reviewed the patient's current medications.  Assessment/Plan: 1. Lower G.I. bleed. Probably due to diverticulosis. Appears to be improving. Would continue on Miralax and observe clinically.   Shaheen Star JR,Sophy Mesler L 09/27/2014, 12:50 PM

## 2014-09-28 ENCOUNTER — Inpatient Hospital Stay (HOSPITAL_COMMUNITY): Payer: Medicare Other

## 2014-09-28 LAB — CBC
HCT: 28.8 % — ABNORMAL LOW (ref 39.0–52.0)
HEMATOCRIT: 23.9 % — AB (ref 39.0–52.0)
HEMOGLOBIN: 9.1 g/dL — AB (ref 13.0–17.0)
Hemoglobin: 7.4 g/dL — ABNORMAL LOW (ref 13.0–17.0)
MCH: 27.1 pg (ref 26.0–34.0)
MCH: 27.2 pg (ref 26.0–34.0)
MCHC: 31 g/dL (ref 30.0–36.0)
MCHC: 31.6 g/dL (ref 30.0–36.0)
MCV: 87.5 fL (ref 78.0–100.0)
MCV: 86 fL (ref 78.0–100.0)
PLATELETS: 258 10*3/uL (ref 150–400)
Platelets: 251 10*3/uL (ref 150–400)
RBC: 2.73 MIL/uL — AB (ref 4.22–5.81)
RBC: 3.35 MIL/uL — ABNORMAL LOW (ref 4.22–5.81)
RDW: 18.6 % — AB (ref 11.5–15.5)
RDW: 18.1 % — ABNORMAL HIGH (ref 11.5–15.5)
WBC: 5.4 10*3/uL (ref 4.0–10.5)
WBC: 6.2 10*3/uL (ref 4.0–10.5)

## 2014-09-28 LAB — PREPARE RBC (CROSSMATCH)

## 2014-09-28 MED ORDER — SODIUM CHLORIDE 0.9 % IV SOLN
Freq: Once | INTRAVENOUS | Status: AC
  Administered 2014-09-28: 17:00:00 via INTRAVENOUS

## 2014-09-28 MED ORDER — TECHNETIUM TC 99M-LABELED RED BLOOD CELLS IV KIT
25.0000 | PACK | Freq: Once | INTRAVENOUS | Status: AC | PRN
  Administered 2014-09-28: 25 via INTRAVENOUS

## 2014-09-28 MED ORDER — BOOST / RESOURCE BREEZE PO LIQD
1.0000 | Freq: Three times a day (TID) | ORAL | Status: DC
  Administered 2014-09-28 – 2014-09-30 (×5): 1 via ORAL

## 2014-09-28 NOTE — Progress Notes (Signed)
INITIAL NUTRITION ASSESSMENT  DOCUMENTATION CODES Per approved criteria  -Not Applicable   INTERVENTION: Provide Resource Breeze po TID, each supplement provides 250 kcal and 9 grams of protein.  Encourage adequate PO intake.  NUTRITION DIAGNOSIS: Increased nutrient needs related to chronic illness, ESRD as evidenced by estimated nutrition needs.   Goal: Pt to meet >/= 90% of their estimated nutrition needs   Monitor:  PO intake, weight trends, labs, I/O's  Reason for Assessment: Clear liquid diet for 7 days  81 y.o. male  Admitting Dx: Lower GI bleed  ASSESSMENT: Pt with PMH of ESRD on daily PD presents with several episodes of liquid bright red stools.  Pt with ongoing bright red stools. Pt has been on a clear liquid diet over the past 7 days. RD to order supplements to aid in caloric and protein needs. Pt reports having a good appetite currently and PTA at home with eating 3 full meals a day with no other difficulties. Pt reports his usual body weight is 160 lbs. Per Epic weight records, pt with a 5.1% weight loss in 1 month. Pt unaware of weight loss. Pt was encouraged to eat/drink his food at meals and to drink his supplements.   Unable to perform nutrition focused physical exam at this time. Will perform during next visit.   Labs: Low GFR. High BUN and creatinine.  Height: Ht Readings from Last 1 Encounters:  09/27/14 5\' 9"  (1.753 m)    Weight: Wt Readings from Last 1 Encounters:  09/27/14 149 lb 14.6 oz (68 kg)    Ideal Body Weight: 160 lbs  % Ideal Body Weight: 93%  Wt Readings from Last 10 Encounters:  09/27/14 149 lb 14.6 oz (68 kg)  09/02/14 156 lb 12.8 oz (71.124 kg)  08/22/14 160 lb 15 oz (73 kg)  06/23/14 163 lb 2.3 oz (74 kg)  10/19/12 158 lb 4.6 oz (71.8 kg)  10/12/12 173 lb 15.1 oz (78.9 kg)  10/01/12 203 lb 11.3 oz (92.4 kg)  09/22/12 176 lb 14.4 oz (80.241 kg)  08/26/12 185 lb (83.915 kg)  08/19/12 176 lb 12.8 oz (80.196 kg)    Usual  Body Weight: 160 lbs  % Usual Body Weight: 93%  BMI:  Body mass index is 22.13 kg/(m^2).  Estimated Nutritional Needs: Kcal: 1850-2050 Protein: 90-105 grams Fluid: Per MD  Skin: +1 LUE and LE edema  Diet Order: Diet clear liquid  EDUCATION NEEDS: -No education needs identified at this time   Intake/Output Summary (Last 24 hours) at 09/28/14 1013 Last data filed at 09/28/14 0900  Gross per 24 hour  Intake    700 ml  Output      0 ml  Net    700 ml    Last BM: 1/5  Labs:   Recent Labs Lab 09/22/14 0225  09/25/14 0533 09/26/14 0515 09/27/14 0530  NA 136  < > 135 135 137  K 4.2  < > 3.6 3.6 3.7  CL 101  < > 98 96 100  CO2 22  < > 26 27 24   BUN 57*  < > 47* 41* 37*  CREATININE 14.67*  < > 13.66* 13.66* 13.65*  CALCIUM 8.4  < > 8.5 8.4 8.7  MG 2.1  --   --   --   --   PHOS 6.8*  --   --   --   --   GLUCOSE 160*  < > 115* 121* 121*  < > = values in this interval  not displayed.  CBG (last 3)  No results for input(s): GLUCAP in the last 72 hours.  Scheduled Meds: . amLODipine  10 mg Oral Daily  . calcitRIOL  0.5 mcg Oral Daily  . [START ON 09/30/2014] cloNIDine  0.1 mg Transdermal Weekly  . [START ON 10/02/2014] darbepoetin (ARANESP) injection - DIALYSIS  100 mcg Subcutaneous Q Sun-1800  . hydrALAZINE  25 mg Oral BID  . labetalol  200 mg Oral BID  . lisinopril  10 mg Oral Daily  . multivitamin  1 tablet Oral QHS  . pantoprazole  40 mg Oral QHS  . polyethylene glycol  17 g Oral TID  . rOPINIRole  0.5 mg Oral QHS  . sevelamer carbonate  1,600 mg Oral TID WC    Continuous Infusions: . sodium chloride 10 mL/hr at 09/25/14 1302  . dialysis solution 1.5% low-MG/low-CA    . dialysis solution 2.5% low-MG/low-CA      Past Medical History  Diagnosis Date  . Hypertensive heart disease   . Hypercholesterolemia   . Gout   . Secondary cardiomyopathy     LVEF 40-45%  . Anemia of chronic disease   . Hypertension   . Arthritis   . Peripheral vascular disease    . ESRD on peritoneal dialysis   . On home oxygen therapy     "use what I need to" (08/17/2014)  . Pneumonia 09/2012  . Pericarditis 08/17/2014  . NSTEMI (non-ST elevated myocardial infarction) 08/17/2014    Past Surgical History  Procedure Laterality Date  . Knee arthroscopy Right 2007  . Back surgery    . Hemorrhoid surgery  1970's  . Ganglion cyst excision  01/03/2012    Procedure: REMOVAL GANGLION OF WRIST;  Surgeon: Scherry Ran, MD;  Location: [facility];  Service: General;  Laterality: Right;  . Colonoscopy    . Av fistula placement  08/24/2012    Procedure: ARTERIOVENOUS (AV) FISTULA CREATION;  Surgeon: Rosetta Posner, MD;  Location: [place];  Service: Vascular;  Laterality: Left;  . Insertion of dialysis catheter Right      neck  . Insertion of dialysis catheter  10/19/2012    Procedure: INSERTION OF DIALYSIS CATHETER;  Surgeon: Rosetta Posner, MD;  Location: [facility];  Service: Vascular;  Laterality: N/A;  REMOVE TEMPORARY CATH  . Cardiac catheterization  08/17/2014  . Cholecystectomy    . Lumbar disc surgery  2004  . Left heart catheterization with coronary angiogram N/A 08/17/2014    Procedure: LEFT HEART CATHETERIZATION WITH CORONARY ANGIOGRAM;  Surgeon: Larey Dresser, MD;  Location: [facility] C[facility] LAB;  Service: Cardiovascular;  Laterality: N/A;  . Colonoscopy Left 09/26/2014    Procedure: COLONOSCOPY;  Surgeon: Arta Silence, MD;  Location: [facility];  Service: Endoscopy;  Laterality: Left;    Kallie Locks, MS, RD, LDN Pager # [phone] After hours/ weekend pager # [phone]

## 2014-09-28 NOTE — Progress Notes (Signed)
Subjective:    Patients only complaint is not having a full diet and having red jell-o. Two bowel movements yesterday with the first one with bright red blood. No BM today and no rectal bleeding. Patient denies weakness or dizziness laying or with standing. Denies headache, SOB, chest pain, abdominal pain, or dysuria.   Objective Filed Vitals:   09/27/14 1823 09/27/14 2104 09/28/14 0526 09/28/14 0909  BP: 106/55 121/57 137/65 122/59  Pulse: 76 74 72 68  Temp: 98.4 F (36.9 C) 98.8 F (37.1 C) 98.1 F (36.7 C) 98 F (36.7 C)  TempSrc: Oral Oral Oral Oral  Resp: 17 17 16 16   Height:  5\' 9"  (1.753 m)    Weight:  68 kg (149 lb 14.6 oz)    SpO2: 100% 97% 99% 100%   Physical Exam General: Alert and oriented. No acute distress.  Heart: RRR, systolic 2/6 murmur.  Lungs: CTA, unlabored  Abdomen: soft, non tender, non distended, +BS, PD cath  Extremities: No edema  Dialysis Access:  PD cath, dressing dry and without discharge   Assessment/Plan: 1. GIB - s/p 2 U RBC 12/31, s/p 1 U RBC 1/5. hgb 7.4 from 8.4 yesterday. Asymptomatic. Admits to one episode of bright red blood in one BM yesterday. Colonoscopy 1/4 results support diverticular as origin of bleeding. No focal bleed noted. If bleeding restarts a tagged RBC scan will be performed. GI following. F/U outpt with [facility].  2. ESRD - PD, K 3.7  3. Anemia - Hgb 7.4, cont weekly aranesp 100 mcg weekly on Sunday (Last dose 01/03)  4. Secondary hyperparathyroidism - Ca 8.7, calcitriol, renvela  5. HTN/volume - Restart norvasc, hydralazine, labetalol, lisinopril, clonidine as BP tolerates, BP 122/59 6. Nutrition - multivitamin, diet clear liquids    Shelle Iron, NP [place] [phone] 09/28/2014,9:44 AM  LOS: 7 days    Additional Objective Labs: Basic Metabolic Panel:  Recent Labs Lab 09/22/14 0225  09/25/14 0533 09/26/14 0515 09/27/14 0530  NA 136  < > 135 135 137  K 4.2  < > 3.6 3.6 3.7  CL  101  < > 98 96 100  CO2 22  < > 26 27 24   GLUCOSE 160*  < > 115* 121* 121*  BUN 57*  < > 47* 41* 37*  CREATININE 14.67*  < > 13.66* 13.66* 13.65*  CALCIUM 8.4  < > 8.5 8.4 8.7  PHOS 6.8*  --   --   --   --   < > = values in this interval not displayed. Liver Function Tests: No results for input(s): AST, ALT, ALKPHOS, BILITOT, PROT, ALBUMIN in the last 168 hours. No results for input(s): LIPASE, AMYLASE in the last 168 hours. CBC:  Recent Labs Lab 09/25/14 0533 09/26/14 0515 09/26/14 1909 09/27/14 0530 09/28/14 0537  WBC 5.8 5.3 5.3 5.1 5.4  NEUTROABS 4.2 3.8 3.7 3.7  --   HGB 7.5* 7.0* 8.1* 8.4* 7.4*  HCT 23.8* 22.4* 24.9* 25.7* 23.9*  MCV 87.2 87.8 86.5 88.6 87.5  PLT 204 222 211 234 251   Blood Culture    Component Value Date/Time   SDES FLUID PLEURAL 09/30/2012 1347   SDES FLUID PLEURAL 09/30/2012 1347   SPECREQUEST NONE 09/30/2012 1347   SPECREQUEST NONE 09/30/2012 1347   CULT NO ACID FAST BACILLI ISOLATED IN 6 WEEKS 09/30/2012 1347   CULT NO GROWTH 3 DAYS 09/30/2012 1347   REPTSTATUS 11/13/2012 FINAL 09/30/2012 1347   REPTSTATUS 10/04/2012 FINAL 09/30/2012 1347  Cardiac Enzymes:  Recent Labs Lab 09/22/14 0229  TROPONINI 0.10*   CBG:  Recent Labs Lab 09/22/14 0022 09/22/14 1522  GLUCAP 158* 115*   Iron Studies: No results for input(s): IRON, TIBC, TRANSFERRIN, FERRITIN in the last 72 hours. @lablastinr3 @ Studies/Results: No results found. Medications: . sodium chloride 10 mL/hr at 09/25/14 1302  . dialysis solution 1.5% low-MG/low-CA    . dialysis solution 2.5% low-MG/low-CA     . amLODipine  10 mg Oral Daily  . calcitRIOL  0.5 mcg Oral Daily  . [START ON 09/30/2014] cloNIDine  0.1 mg Transdermal Weekly  . [START ON 10/02/2014] darbepoetin (ARANESP) injection - DIALYSIS  100 mcg Subcutaneous Q Sun-1800  . hydrALAZINE  25 mg Oral BID  . labetalol  200 mg Oral BID  . lisinopril  10 mg Oral Daily  . multivitamin  1 tablet Oral QHS  .  pantoprazole  40 mg Oral QHS  . polyethylene glycol  17 g Oral TID  . rOPINIRole  0.5 mg Oral QHS  . sevelamer carbonate  1,600 mg Oral TID WC

## 2014-09-28 NOTE — Progress Notes (Signed)
PROGRESS NOTE    TC KAPUSTA ZOX:[id number] DOB: 24-Oct-1931 DOA: 09/21/2014 PCP: [name], MD  HPI/Brief narrative 81 year old male with history of ESRD on PD, last colonoscopy >10 years ago, recently admitted 08/17/14 for chest pain and elevated troponins at which time he underwent diagnostic LHC that showed mild-to-moderate diffuse CAD and it was felt that his presentation was more likely due to myopericarditis in the setting of inadequate renal dialysis. He was treated with high dose aspirin, colchicine, prednisone and PPI. He then had an admission on 12/7 for acute hypoxic respiratory failure secondary to fluid overload, he now presented to [facility] on 12/30 with several episodes of bright red bleeding per rectum. He was transferred to [facility] and GI was consulted. Nephrology on board for dialysis needs.  Today, patient doing okay however had more bleeding. Hemoglobin down another gram. Patient without any belly pain, but frustrated about rebleeding   Assessment/Plan:  1. Acute lower GI bleed: Suspected diverticular bleed. Negative bleeding scan. Status post colonoscopy 1/4 confirming diverticular bleed. Initially improving with GI following. Needed more blood today. Next step per GIs for bleeding scan  2. Acute post hemorrhagic anemia: Complicating underlying chronic anemia. Secondary to GI bleed. Aranesp per nephrology. S/P 2 units PRBC's 12/31. Hb has dropped again from 7.5>7 on 1/4. S/P 1 unit PRBC on 1/4 and again on 1/6 3. ESRD on PD: Management per nephrology. 4. Essential hypertension: Controlled. Due to soft blood pressures 1/3, clonidine reduced from 0.2  to 0.1 transdermally weekly, hydralazine from 25 MG 3 times a day to twice a day and labetalol from 400 MG to 200 MG twice a day and continued on lisinopril 10 MG daily. On and off soft blood pressures-may need further titrating down. 5. Cardiomyopathy/recent uremic myopericarditis: Last echo with LVEF 40-45  percent. Aspirin on hold secondary to GI bleed. Minimally elevated troponin of 0.10-likely trending down from recent episode of myopericarditis. No reported chest pain. No further workup.    Code Status: Full Family Communication: None at bedside Disposition Plan: Home when medically stable.   Consultants:  GI  Nephrology  Procedures:  PD  Colonoscopy 1/4.  Antibiotics:  None   Objective: Filed Vitals:   09/28/14 1151 09/28/14 1652 09/28/14 1724 09/28/14 1824  BP: 110/58 126/82 139/64 119/64  Pulse: 69 78 72 68  Temp: 97.6 F (36.4 C) 97.8 F (36.6 C) 98 F (36.7 C) 97.7 F (36.5 C)  TempSrc: Oral Oral Oral Oral  Resp: 16 16 16 16   Height:      Weight:      SpO2: 100% 100% 100% 96%    Intake/Output Summary (Last 24 hours) at 09/28/14 1859 Last data filed at 09/28/14 1709  Gross per 24 hour  Intake    370 ml  Output      0 ml  Net    370 ml   Filed Weights   09/25/14 0529 09/25/14 2148 09/27/14 2104  Weight: 70.4 kg (155 lb 3.3 oz) 69.3 kg (152 lb 12.5 oz) 68 kg (149 lb 14.6 oz)     Exam:  General exam: Alert and oriented 3, no acute distress Respiratory system: Clear to auscultation bilaterally Cardiovascular system: Regular rate and rhythm, S1-S2 Gastrointestinal system: Soft, nontender, nondistended, positive bowel sounds Extremities: No clubbing or cyanosis, trace edema   Data Reviewed: Basic Metabolic Panel:  Recent Labs Lab 09/22/14 0225 09/23/14 0218 09/24/14 0545 09/25/14 0533 09/26/14 0515 09/27/14 0530  NA 136 136 135 135 135 137  K 4.2 4.5 4.1 3.6 3.6 3.7  CL 101 99 98 98 96 100  CO2 22 20 20 26 27 24   GLUCOSE 160* 156* 118* 115* 121* 121*  BUN 57* 65* 54* 47* 41* 37*  CREATININE 14.67* 15.97* 14.04* 13.66* 13.66* 13.65*  CALCIUM 8.4 8.9 8.7 8.5 8.4 8.7  MG 2.1  --   --   --   --   --   PHOS 6.8*  --   --   --   --   --    Liver Function Tests: No results for input(s): AST, ALT, ALKPHOS, BILITOT, PROT, ALBUMIN in the  last 168 hours. No results for input(s): LIPASE, AMYLASE in the last 168 hours. No results for input(s): AMMONIA in the last 168 hours. CBC:  Recent Labs Lab 09/24/14 0545 09/25/14 0533 09/26/14 0515 09/26/14 1909 09/27/14 0530 09/28/14 0537  WBC 8.2 5.8 5.3 5.3 5.1 5.4  NEUTROABS 6.4 4.2 3.8 3.7 3.7  --   HGB 7.6* 7.5* 7.0* 8.1* 8.4* 7.4*  HCT 23.8* 23.8* 22.4* 24.9* 25.7* 23.9*  MCV 88.1 87.2 87.8 86.5 88.6 87.5  PLT 226 204 222 211 234 251   Cardiac Enzymes:  Recent Labs Lab 09/22/14 0229  TROPONINI 0.10*   BNP (last 3 results)  Recent Labs  06/21/14 1046 08/17/14 0625 08/29/14 0928  PROBNP >70000.0* >70000.0* >70000.0*   CBG:  Recent Labs Lab 09/22/14 0022 09/22/14 1522  GLUCAP 158* 115*    No results found for this or any previous visit (from the past 240 hour(s)).       Studies: Nm Gi Blood Loss  09/28/2014   CLINICAL DATA:  Gastrointestinal bleeding.  EXAM: NUCLEAR MEDICINE GASTROINTESTINAL BLEEDING SCAN  TECHNIQUE: Sequential abdominal images were obtained following intravenous administration of Tc-39m labeled red blood cells.  RADIOPHARMACEUTICALS:  25.0 mCi Tc-90m in-vitro labeled red cells.  COMPARISON:  [facility] bleeding scan 09/24/2014  FINDINGS: No abnormal accumulation of radiotracer within the abdomen or pelvis to suggest active bleeding into the small or large bowel. Physiologic activity is noted in the solid organs, blood pool, and genitalia.  IMPRESSION: No scintigraphic evidence of active gastrointestinal bleeding.   Electronically Signed   By: Suzy Bouchard M.D.   On: 09/28/2014 17:31        Scheduled Meds: . amLODipine  10 mg Oral Daily  . calcitRIOL  0.5 mcg Oral Daily  . [START ON 09/30/2014] cloNIDine  0.1 mg Transdermal Weekly  . [START ON 10/02/2014] darbepoetin (ARANESP) injection - DIALYSIS  100 mcg Subcutaneous Q Sun-1800  . feeding supplement (RESOURCE BREEZE)  1 Container Oral TID BM  . hydrALAZINE  25 mg Oral BID  .  labetalol  200 mg Oral BID  . lisinopril  10 mg Oral Daily  . multivitamin  1 tablet Oral QHS  . pantoprazole  40 mg Oral QHS  . polyethylene glycol  17 g Oral TID  . rOPINIRole  0.5 mg Oral QHS  . sevelamer carbonate  1,600 mg Oral TID WC   Continuous Infusions: . sodium chloride 10 mL/hr at 09/25/14 1302  . dialysis solution 1.5% low-MG/low-CA    . dialysis solution 2.5% low-MG/low-CA      Active Problems:   End stage renal disease, has been peritoneal, now hemodialysis   Lower GI bleed   ESRD (end stage renal disease)   Blood loss anemia    Time spent: 30 minutes.    Annita Brod, MD, FACP, [facility]. [facility] Pager [phone]  If 7PM-7AM,  please contact night-coverage [email or web address] Password [email or web address] 09/28/2014, 6:59 PM    LOS: 7 days

## 2014-09-28 NOTE — Progress Notes (Signed)
[facility] PROGRESS NOTE Subjective patient reports that he began to have some increasing brightness to the stools. He is not having any abdominal pain. Colonoscopy by Dr. Paulita Fujita just 2 days ago showed diverticulosis with Clots and coffee ground material without the specific bleeding seen. There were few areas of polyps in the cecum that were biopsied.   Obctive: Vital signs in last 24 hours: Temp:  [98 F (36.7 C)-98.8 F (37.1 C)] 98 F (36.7 C) (01/06 0909) Pulse Rate:  [68-76] 69 (01/06 1102) Resp:  [16-17] 16 (01/06 0909) BP: (106-137)/(54-68) 125/68 mmHg (01/06 1102) SpO2:  [97 %-100 %] 100 % (01/06 0909) Weight:  [68 kg (149 lb 14.6 oz)] 68 kg (149 lb 14.6 oz) (01/05 2104) Last BM Date: 09/27/14  Intake/Output from previous day: 01/05 0701 - 01/06 0700 In: 460 [P.O.:460] Out: 0  Intake/Output this shift: Total I/O In: 240 [P.O.:240] Out: -   PE: General-- no acute distress  Heart-- Lungs-- clear  Abdomen-- soft and nontender   Lab Results:  Recent Labs  09/26/14 0515 09/26/14 1909 09/27/14 0530 09/28/14 0537  WBC 5.3 5.3 5.1 5.4  HGB 7.0* 8.1* 8.4* 7.4*  HCT 22.4* 24.9* 25.7* 23.9*  PLT 222 211 234 251   BMET  Recent Labs  09/26/14 0515 09/27/14 0530  NA 135 137  K 3.6 3.7  CL 96 100  CO2 27 24  CREATININE 13.66* 13.65*   LFT No results for input(s): PROT, AST, ALT, ALKPHOS, BILITOT, BILIDIR, IBILI in the last 72 hours. PT/INR No results for input(s): LABPROT, INR in the last 72 hours. PANCREAS No results for input(s): LIPASE in the last 72 hours.       Studies/Results: No results found.  Medications: I have reviewed the patient's current medications.  Assessment/Plan: 1. Lower G.I. bleed. Probably this is a diverticular bleed with clots and coffee ground material throughout colon recent colonoscopy.  I think he should have a G.I. bleeding scan and consideration for angiography if bleeding situs determine.   Anadalay Macdonell  JR,Hurley Sobel L 09/28/2014, 11:43 AM

## 2014-09-29 DIAGNOSIS — K5731 Diverticulosis of large intestine without perforation or abscess with bleeding: Principal | ICD-10-CM

## 2014-09-29 LAB — TYPE AND SCREEN
ABO/RH(D): O POS
ANTIBODY SCREEN: NEGATIVE
UNIT DIVISION: 0
Unit division: 0

## 2014-09-29 LAB — CBC
HEMATOCRIT: 27 % — AB (ref 39.0–52.0)
HEMOGLOBIN: 8.8 g/dL — AB (ref 13.0–17.0)
MCH: 28.4 pg (ref 26.0–34.0)
MCHC: 32.6 g/dL (ref 30.0–36.0)
MCV: 87.1 fL (ref 78.0–100.0)
PLATELETS: 242 10*3/uL (ref 150–400)
RBC: 3.1 MIL/uL — ABNORMAL LOW (ref 4.22–5.81)
RDW: 18.5 % — AB (ref 11.5–15.5)
WBC: 5.7 10*3/uL (ref 4.0–10.5)

## 2014-09-29 NOTE — Progress Notes (Signed)
Patient refusing bed alarm this evening shift.  Re-educated patient on importance of utilizing bed alarm; patient still refused.  Non-skid socks on.  Bed in lowest position.  Emphasized use of call bell if needed to use the bathroom; verbalized understanding.  Will continue to monitor patient.

## 2014-09-29 NOTE — Progress Notes (Signed)
[facility] PROGRESS NOTE Subjective Bleeding scan is negative, pt reports no more blood in stool.  Objective: Vital signs in last 24 hours: Temp:  [97.7 F (36.5 C)-98.2 F (36.8 C)] 98.2 F (36.8 C) (01/07 1000) Pulse Rate:  [68-78] 68 (01/07 1000) Resp:  [16-18] 18 (01/07 1000) BP: (113-139)/(55-82) 119/60 mmHg (01/07 1000) SpO2:  [96 %-100 %] 98 % (01/07 1000) Last BM Date: 09/28/14  Intake/Output from previous day: 01/06 0701 - 01/07 0700 In: 575 [P.O.:240; Blood:335] Out: 20 [Urine:20] Intake/Output this shift: Total I/O In: 480 [P.O.:480] Out: -   PE: General--NAD  Abdomen--soft nontender  Lab Results:  Recent Labs  09/26/14 1909 09/27/14 0530 09/28/14 0537 09/28/14 2225 09/29/14 1443  WBC 5.3 5.1 5.4 6.2 5.7  HGB 8.1* 8.4* 7.4* 9.1* 8.8*  HCT 24.9* 25.7* 23.9* 28.8* 27.0*  PLT 211 234 251 258 242   BMET  Recent Labs  09/27/14 0530  NA 137  K 3.7  CL 100  CO2 24  CREATININE 13.65*   LFT No results for input(s): PROT, AST, ALT, ALKPHOS, BILITOT, BILIDIR, IBILI in the last 72 hours. PT/INR No results for input(s): LABPROT, INR in the last 72 hours. PANCREAS No results for input(s): LIPASE in the last 72 hours.       Studies/Results: Nm Gi Blood Loss  09/28/2014   CLINICAL DATA:  Gastrointestinal bleeding.  EXAM: NUCLEAR MEDICINE GASTROINTESTINAL BLEEDING SCAN  TECHNIQUE: Sequential abdominal images were obtained following intravenous administration of Tc-82m labeled red blood cells.  RADIOPHARMACEUTICALS:  25.0 mCi Tc-8m in-vitro labeled red cells.  COMPARISON:  [facility] bleeding scan 09/24/2014  FINDINGS: No abnormal accumulation of radiotracer within the abdomen or pelvis to suggest active bleeding into the small or large bowel. Physiologic activity is noted in the solid organs, blood pool, and genitalia.  IMPRESSION: No scintigraphic evidence of active gastrointestinal bleeding.   Electronically Signed   By: Suzy Bouchard  M.D.   On: 09/28/2014 17:31    Medications: I have reviewed the patient's current medications.  Assessment/Plan: 1. LGI Bleed. Probably diverticular. Appears to have stopped. Would keep on miralax to keep the stools soft and slowly advance the diet. Please call us for any further problems.   Jojo Pehl JR,Quanna Wittke L 09/29/2014, 4:45 PM

## 2014-09-29 NOTE — Progress Notes (Signed)
PROGRESS NOTE    Randy [name]:[id number] DOB: 1932-09-21 DOA: 09/21/2014 PCP: [name], MD  HPI/Brief narrative 81 year old male with history of ESRD on PD, last colonoscopy >10 years ago, recently admitted 08/17/14 for chest pain and elevated troponins at which time he underwent diagnostic LHC that showed mild-to-moderate diffuse CAD and it was felt that his presentation was more likely due to myopericarditis in the setting of inadequate renal dialysis. He was treated with high dose aspirin, colchicine, prednisone and PPI. He then had an admission on 12/7 for acute hypoxic respiratory failure secondary to fluid overload, he now presented to [facility] on 12/30 with several episodes of bright red bleeding per rectum. He was transferred to [facility] and GI was consulted. Nephrology on board for dialysis needs.  Patient initially did well, but then hemoglobin dropped him 1/6 requiring more blood. Follow up by GI and patient underwent bleeding scan 1/7 which was unrevealing.  Patient himself is feeling better today with no complaints, no abdominal pain and no more bloody bowel movements   Assessment/Plan:  1. Acute lower GI bleed: Suspected diverticular bleed. Negative bleeding scan. Status post colonoscopy 1/4 confirming diverticular bleed. Initially improving with GI following. More blood transfuse 1/6. Negative bleeding scan. If hemoglobin is stable by 1/8, discharged home. GI signed off  2. Acute post hemorrhagic anemia: Complicating underlying chronic anemia. Secondary to GI bleed. Aranesp per nephrology. S/P 2 units PRBC's 12/31. Hb has dropped again from 7.5>7 on 1/4. S/P 1 unit PRBC on 1/4 and again on 1/6 3. ESRD on PD: Management per nephrology. 4. Essential hypertension: Controlled. Due to soft blood pressures 1/3, clonidine reduced from 0.2  to 0.1 transdermally weekly, hydralazine from 25 MG 3 times a day to twice a day and labetalol from 400 MG to 200 MG twice a day  and continued on lisinopril 10 MG daily. On and off soft blood pressures-may need further titrating down. 5. Cardiomyopathy/recent uremic myopericarditis: Last echo with LVEF 40-45 percent. Aspirin on hold secondary to GI bleed. Minimally elevated troponin of 0.10-likely trending down from recent episode of myopericarditis. No reported chest pain. No further workup.    Code Status: Full Family Communication: None at bedside Disposition Plan: Home possibly tomorrow if hemoglobin stable   Consultants:  GI  Nephrology  Procedures:  PD  Colonoscopy 1/4.  Bleeding scan done 1/7: Unrevealing  Antibiotics:  None   Objective: Filed Vitals:   09/28/14 2153 09/29/14 0559 09/29/14 1000 09/29/14 1700  BP: 125/61 113/55 119/60 137/64  Pulse: 76 75 68 70  Temp: 98.1 F (36.7 C) 98.1 F (36.7 C) 98.2 F (36.8 C) 98.6 F (37 C)  TempSrc: Oral Oral Oral Oral  Resp: 16 18 18 18   Height:      Weight:      SpO2: 99% 100% 98% 94%    Intake/Output Summary (Last 24 hours) at 09/29/14 2042 Last data filed at 09/29/14 1500  Gross per 24 hour  Intake    480 ml  Output     20 ml  Net    460 ml   Filed Weights   09/25/14 0529 09/25/14 2148 09/27/14 2104  Weight: 70.4 kg (155 lb 3.3 oz) 69.3 kg (152 lb 12.5 oz) 68 kg (149 lb 14.6 oz)     Exam:  General exam: Alert and oriented 3, no acute distress Respiratory system: Clear to auscultation bilaterally Cardiovascular system: Regular rate and rhythm, S1-S2 Gastrointestinal system: Soft, nontender, nondistended, positive bowel sounds Extremities:  No clubbing or cyanosis, trace edema   Data Reviewed: Basic Metabolic Panel:  Recent Labs Lab 09/23/14 0218 09/24/14 0545 09/25/14 0533 09/26/14 0515 09/27/14 0530  NA 136 135 135 135 137  K 4.5 4.1 3.6 3.6 3.7  CL 99 98 98 96 100  CO2 20 20 26 27 24   GLUCOSE 156* 118* 115* 121* 121*  BUN 65* 54* 47* 41* 37*  CREATININE 15.97* 14.04* 13.66* 13.66* 13.65*  CALCIUM 8.9 8.7  8.5 8.4 8.7   Liver Function Tests: No results for input(s): AST, ALT, ALKPHOS, BILITOT, PROT, ALBUMIN in the last 168 hours. No results for input(s): LIPASE, AMYLASE in the last 168 hours. No results for input(s): AMMONIA in the last 168 hours. CBC:  Recent Labs Lab 09/24/14 0545 09/25/14 0533 09/26/14 0515 09/26/14 1909 09/27/14 0530 09/28/14 0537 09/28/14 2225 09/29/14 1443  WBC 8.2 5.8 5.3 5.3 5.1 5.4 6.2 5.7  NEUTROABS 6.4 4.2 3.8 3.7 3.7  --   --   --   HGB 7.6* 7.5* 7.0* 8.1* 8.4* 7.4* 9.1* 8.8*  HCT 23.8* 23.8* 22.4* 24.9* 25.7* 23.9* 28.8* 27.0*  MCV 88.1 87.2 87.8 86.5 88.6 87.5 86.0 87.1  PLT 226 204 222 211 234 251 258 242   Cardiac Enzymes: No results for input(s): CKTOTAL, CKMB, CKMBINDEX, TROPONINI in the last 168 hours. BNP (last 3 results)  Recent Labs  06/21/14 1046 08/17/14 0625 08/29/14 0928  PROBNP >70000.0* >70000.0* >70000.0*   CBG: No results for input(s): GLUCAP in the last 168 hours.  No results found for this or any previous visit (from the past 240 hour(s)).       Studies: Nm Gi Blood Loss  09/28/2014   CLINICAL DATA:  Gastrointestinal bleeding.  EXAM: NUCLEAR MEDICINE GASTROINTESTINAL BLEEDING SCAN  TECHNIQUE: Sequential abdominal images were obtained following intravenous administration of Tc-34m labeled red blood cells.  RADIOPHARMACEUTICALS:  25.0 mCi Tc-72m in-vitro labeled red cells.  COMPARISON:  [facility] bleeding scan 09/24/2014  FINDINGS: No abnormal accumulation of radiotracer within the abdomen or pelvis to suggest active bleeding into the small or large bowel. Physiologic activity is noted in the solid organs, blood pool, and genitalia.  IMPRESSION: No scintigraphic evidence of active gastrointestinal bleeding.   Electronically Signed   By: Suzy Bouchard M.D.   On: 09/28/2014 17:31        Scheduled Meds: . amLODipine  10 mg Oral Daily  . calcitRIOL  0.5 mcg Oral Daily  . [START ON 09/30/2014] cloNIDine  0.1 mg  Transdermal Weekly  . [START ON 10/02/2014] darbepoetin (ARANESP) injection - DIALYSIS  100 mcg Subcutaneous Q Sun-1800  . feeding supplement (RESOURCE BREEZE)  1 Container Oral TID BM  . hydrALAZINE  25 mg Oral BID  . labetalol  200 mg Oral BID  . lisinopril  10 mg Oral Daily  . multivitamin  1 tablet Oral QHS  . pantoprazole  40 mg Oral QHS  . polyethylene glycol  17 g Oral TID  . rOPINIRole  0.5 mg Oral QHS  . sevelamer carbonate  1,600 mg Oral TID WC   Continuous Infusions: . sodium chloride 10 mL/hr at 09/25/14 1302  . dialysis solution 1.5% low-MG/low-CA    . dialysis solution 2.5% low-MG/low-CA      Active Problems:   End stage renal disease, has been peritoneal, now hemodialysis   Lower GI bleed   ESRD (end stage renal disease)   Blood loss anemia    Time spent: 15 minutes.    Annita Brod,  MD, FACP, FHM. [facility] Pager [phone]  If 7PM-7AM, please contact night-coverage [email or web address] Password [email or web address] 09/29/2014, 8:42 PM    LOS: 8 days

## 2014-09-29 NOTE — Progress Notes (Signed)
Subjective:     Patients only complaint is not having a full diet and having red jell-o. The pt reports multiple BMs yesterday and states one possibly had blood in it, it appeared more red. No bright red blood. No rectal bleeding or blood in stools noted today. Patient denies weakness or dizziness laying or with standing. Denies headache, SOB, chest pain, abdominal pain, or dysuria.    Objective Filed Vitals:   09/28/14 1824 09/28/14 1920 09/28/14 2153 09/29/14 0559  BP: 119/64 127/59 125/61 113/55  Pulse: 68 74 76 75  Temp: 97.7 F (36.5 C) 98.1 F (36.7 C) 98.1 F (36.7 C) 98.1 F (36.7 C)  TempSrc: Oral Oral Oral Oral  Resp: 16 16 16 18   Height:      Weight:      SpO2: 96% 98% 99% 100%   Physical Exam General: Alert and oriented. No acute distress.  Heart: RRR, systolic 2/6 murmur.  Lungs: CTA, unlabored  Abdomen: soft, non tender, non distended, +BS, PD cath  Extremities: No edema  Dialysis Access: PD cath, dressing dry and without discharge   Assessment/Plan: 1. GIB - s/p 2 U RBC 12/31, s/p 1 U RBC 1/5, s/p 1 U RBC 1/6. hgb 9.1 from 7.4 yesterday.  Admits to one episode of red blood in one BM yesterday. Colonoscopy 1/4 results support diverticular as origin of bleeding. No focal bleed noted. If bleeding restarts a tagged RBC scan will be performed. GI bleeding scan 1/6 showed no active bleeding. GI following. F/U outpt with [facility].  2. ESRD - PD, K 3.7 yesterday, follow CMP today  3. Anemia - Hgb 9.1 , cont weekly aranesp 100 mcg weekly on Sunday (Last dose 01/03)  4. Secondary hyperparathyroidism - Ca 8.7 yesterday follow CMP today, calcitriol, renvela  5. HTN/volume - Restart norvasc, hydralazine, labetalol, lisinopril, clonidine as BP tolerates, BP 113/55 6. Nutrition - multivitamin, diet clear liquids- tolerating well  Shelle Iron, NP [place] [phone] 09/29/2014,9:46 AM  LOS: 8 days    Additional Objective Labs: Basic  Metabolic Panel:  Recent Labs Lab 09/25/14 0533 09/26/14 0515 09/27/14 0530  NA 135 135 137  K 3.6 3.6 3.7  CL 98 96 100  CO2 26 27 24   GLUCOSE 115* 121* 121*  BUN 47* 41* 37*  CREATININE 13.66* 13.66* 13.65*  CALCIUM 8.5 8.4 8.7   Liver Function Tests: No results for input(s): AST, ALT, ALKPHOS, BILITOT, PROT, ALBUMIN in the last 168 hours. No results for input(s): LIPASE, AMYLASE in the last 168 hours. CBC:  Recent Labs Lab 09/26/14 0515 09/26/14 1909 09/27/14 0530 09/28/14 0537 09/28/14 2225  WBC 5.3 5.3 5.1 5.4 6.2  NEUTROABS 3.8 3.7 3.7  --   --   HGB 7.0* 8.1* 8.4* 7.4* 9.1*  HCT 22.4* 24.9* 25.7* 23.9* 28.8*  MCV 87.8 86.5 88.6 87.5 86.0  PLT 222 211 234 251 258   Blood Culture    Component Value Date/Time   SDES FLUID PLEURAL 09/30/2012 1347   SDES FLUID PLEURAL 09/30/2012 1347   SPECREQUEST NONE 09/30/2012 1347   SPECREQUEST NONE 09/30/2012 1347   CULT NO ACID FAST BACILLI ISOLATED IN 6 WEEKS 09/30/2012 1347   CULT NO GROWTH 3 DAYS 09/30/2012 1347   REPTSTATUS 11/13/2012 FINAL 09/30/2012 1347   REPTSTATUS 10/04/2012 FINAL 09/30/2012 1347    Cardiac Enzymes: No results for input(s): CKTOTAL, CKMB, CKMBINDEX, TROPONINI in the last 168 hours. CBG:  Recent Labs Lab 09/22/14 1522  GLUCAP 115*   Iron  Studies: No results for input(s): IRON, TIBC, TRANSFERRIN, FERRITIN in the last 72 hours. @lablastinr3 @ Studies/Results: Nm Gi Blood Loss  09/28/2014   CLINICAL DATA:  Gastrointestinal bleeding.  EXAM: NUCLEAR MEDICINE GASTROINTESTINAL BLEEDING SCAN  TECHNIQUE: Sequential abdominal images were obtained following intravenous administration of Tc-59m labeled red blood cells.  RADIOPHARMACEUTICALS:  25.0 mCi Tc-104m in-vitro labeled red cells.  COMPARISON:  [facility] bleeding scan 09/24/2014  FINDINGS: No abnormal accumulation of radiotracer within the abdomen or pelvis to suggest active bleeding into the small or large bowel. Physiologic activity is noted in  the solid organs, blood pool, and genitalia.  IMPRESSION: No scintigraphic evidence of active gastrointestinal bleeding.   Electronically Signed   By: Suzy Bouchard M.D.   On: 09/28/2014 17:31   Medications: . sodium chloride 10 mL/hr at 09/25/14 1302  . dialysis solution 1.5% low-MG/low-CA    . dialysis solution 2.5% low-MG/low-CA     . amLODipine  10 mg Oral Daily  . calcitRIOL  0.5 mcg Oral Daily  . [START ON 09/30/2014] cloNIDine  0.1 mg Transdermal Weekly  . [START ON 10/02/2014] darbepoetin (ARANESP) injection - DIALYSIS  100 mcg Subcutaneous Q Sun-1800  . feeding supplement (RESOURCE BREEZE)  1 Container Oral TID BM  . hydrALAZINE  25 mg Oral BID  . labetalol  200 mg Oral BID  . lisinopril  10 mg Oral Daily  . multivitamin  1 tablet Oral QHS  . pantoprazole  40 mg Oral QHS  . polyethylene glycol  17 g Oral TID  . rOPINIRole  0.5 mg Oral QHS  . sevelamer carbonate  1,600 mg Oral TID WC

## 2014-09-30 LAB — CBC
HEMATOCRIT: 27.5 % — AB (ref 39.0–52.0)
Hemoglobin: 8.6 g/dL — ABNORMAL LOW (ref 13.0–17.0)
MCH: 27 pg (ref 26.0–34.0)
MCHC: 31.3 g/dL (ref 30.0–36.0)
MCV: 86.5 fL (ref 78.0–100.0)
PLATELETS: 276 10*3/uL (ref 150–400)
RBC: 3.18 MIL/uL — ABNORMAL LOW (ref 4.22–5.81)
RDW: 18.3 % — AB (ref 11.5–15.5)
WBC: 6.6 10*3/uL (ref 4.0–10.5)

## 2014-09-30 MED ORDER — BOOST / RESOURCE BREEZE PO LIQD: 1.0000 | Freq: Three times a day (TID) | ORAL | Status: DC

## 2014-09-30 NOTE — Progress Notes (Signed)
Patient ID: Randy [name], male   DOB: 1932-02-13, 81 y.o.   MRN: [id number]  [facility] Progress Note   Assessment/ Plan:   1. Lower gastrointestinal bleed with acute blood loss anemia: Status post colonoscopy with evidence of diverticulosis-being the suspected bleeding site also had a follow-up negative bleeding scan. Appears to be hemodynamically stable and potentially suitable for discharge later today. 2. ESRD - continue peritoneal dialysis at usual outpatient schedule-no changes made during this hospitalization  3. Anemia -continue weekly Aranesp for management of anemia  4. Secondary hyperparathyroidism - on calcitriol for PTH suppression, continue Renvela for phosphorus binding 5. HTN/volume - antihypertensive therapy gradually restarted-currently normotensive 6. Nutrition - remains on renal vitamin, tolerating a renal diet without problems.  Subjective:   Reports to be feeling better-no further blood in his stool    Objective:   BP 117/58 mmHg  Pulse 69  Temp(Src) 98.2 F (36.8 C) (Oral)  Resp 18  Ht 5\' 9"  (1.753 m)  Wt 68.8 kg (151 lb 10.8 oz)  BMI 22.39 kg/m2  SpO2 100%  Physical Exam: Gen: Comfortably resting in bed CVS: Pulse regular in rate and rhythm Resp: Clear to auscultation Abd: Soft, flat, nontender Ext: No lower extremity edema  Labs: BMET  Recent Labs Lab 09/24/14 0545 09/25/14 0533 09/26/14 0515 09/27/14 0530  NA 135 135 135 137  K 4.1 3.6 3.6 3.7  CL 98 98 96 100  CO2 20 26 27 24   GLUCOSE 118* 115* 121* 121*  BUN 54* 47* 41* 37*  CREATININE 14.04* 13.66* 13.66* 13.65*  CALCIUM 8.7 8.5 8.4 8.7   CBC  Recent Labs Lab 09/25/14 0533 09/26/14 0515 09/26/14 1909 09/27/14 0530 09/28/14 0537 09/28/14 2225 09/29/14 1443 09/30/14 0634  WBC 5.8 5.3 5.3 5.1 5.4 6.2 5.7 6.6  NEUTROABS 4.2 3.8 3.7 3.7  --   --   --   --   HGB 7.5* 7.0* 8.1* 8.4* 7.4* 9.1* 8.8* 8.6*  HCT 23.8* 22.4* 24.9* 25.7* 23.9* 28.8* 27.0* 27.5*   MCV 87.2 87.8 86.5 88.6 87.5 86.0 87.1 86.5  PLT 204 222 211 234 251 258 242 276   Medications:    . amLODipine  10 mg Oral Daily  . calcitRIOL  0.5 mcg Oral Daily  . cloNIDine  0.1 mg Transdermal Weekly  . [START ON 10/02/2014] darbepoetin (ARANESP) injection - DIALYSIS  100 mcg Subcutaneous Q Sun-1800  . feeding supplement (RESOURCE BREEZE)  1 Container Oral TID BM  . hydrALAZINE  25 mg Oral BID  . labetalol  200 mg Oral BID  . lisinopril  10 mg Oral Daily  . multivitamin  1 tablet Oral QHS  . pantoprazole  40 mg Oral QHS  . polyethylene glycol  17 g Oral TID  . rOPINIRole  0.5 mg Oral QHS  . sevelamer carbonate  1,600 mg Oral TID WC   Elmarie Shiley, MD 09/30/2014, 11:19 AM

## 2014-09-30 NOTE — Progress Notes (Signed)
Pt. Discharged home with family. Discharge instructions given. Pt verbalized signs and symptoms of when to call the doctor and signs of worsening condition.  Penni Bombard, RN 09/30/2014 1:46 PM

## 2014-09-30 NOTE — Discharge Summary (Signed)
Discharge Summary  Randy [name]:[id number] DOB: 07-Dec-1931  PCP: [name], MD  Admit date: 09/21/2014 Discharge date: 09/30/2014  Time spent: 20 minutes  Recommendations for Outpatient Follow-up:  1. Patient will follow-up next primary care doctor in the next month as needed 2. Advised to continue on MiraLAX to keep stool soft to prevent straining   Discharge Diagnoses:  Active Hospital Problems   Diagnosis Date Noted  . ESRD (end stage renal disease)   . Blood loss anemia   . Lower GI bleed 09/21/2014  . End stage renal disease, has been peritoneal, now hemodialysis 07/28/2012    Resolved Hospital Problems   Diagnosis Date Noted Date Resolved  No resolved problems to display.    Discharge Condition: Improved, being discharged home  Diet recommendation: Renal diet  Filed Weights   09/25/14 2148 09/27/14 2104 09/30/14 0109  Weight: 69.3 kg (152 lb 12.5 oz) 68 kg (149 lb 14.6 oz) 68.8 kg (151 lb 10.8 oz)    History of present illness:  81 year old male with history of ESRD on PD, last colonoscopy >10 years ago, recently admitted 08/17/14 for chest pain and elevated troponins at which time he underwent diagnostic LHC that showed mild-to-moderate diffuse CAD and it was felt that his presentation was more likely due to myopericarditis in the setting of inadequate renal dialysis. He was treated with high dose aspirin, colchicine, prednisone and PPI. He then had an admission on 12/7 for acute hypoxic respiratory failure secondary to fluid overload, he now presented to [facility] on 12/30 with several episodes of bright red bleeding per rectum. He was transferred to [facility] and GI was consulted. Nephrology on board for dialysis needs.  Hospital Course:  Active Problems:   End stage renal disease, has been peritoneal, now hemodialysis: Followed by nephrology, dialysis as scheduled   Lower GI bleed, likely diverticular/acute blood loss anemia: Seen by GI. Underwent  colonoscopy which was unrevealing. Following on 1/6, patient's blood levels dropped requiring more blood. Tagged blood cell scan done 1/7 which noted no bleeding. Blood levels monitored and remained stable and patient was able to be discharged home on 1/8 History of adenopathy from recent uremic myopericarditis: Aspirin on hold secondary to GI bleed, restarted upon discharge. Essential hypertension: Controlled, continue home meds    Procedures:  Status post colonoscopy done 1/4 confirming diverticular bleed, but no evidence of active bleeding.  Status post bleeding scan done 1/7: Unrevealing  Consultations:  GI  Nephrology  Discharge Exam: BP 117/58 mmHg  Pulse 69  Temp(Src) 98.2 F (36.8 C) (Oral)  Resp 18  Ht 5\' 9"  (1.753 m)  Wt 68.8 kg (151 lb 10.8 oz)  BMI 22.39 kg/m2  SpO2 100%  General: Alert and oriented 3, no acute distress Cardiovascular: Regular rate and rhythm, S1-S2 Respiratory: Clear to auscultation bilaterally  Discharge Instructions You were cared for by a hospitalist during your hospital stay. If you have any questions about your discharge medications or the care you received while you were in the hospital after you are discharged, you can call the unit and asked to speak with the hospitalist on call if the hospitalist that took care of you is not available. Once you are discharged, your primary care physician will handle any further medical issues. Please note that NO REFILLS for any discharge medications will be authorized once you are discharged, as it is imperative that you return to your primary care physician (or establish a relationship with a primary care physician if you  do not have one) for your aftercare needs so that they can reassess your need for medications and monitor your lab values.  Discharge Instructions    Diet - low sodium heart healthy    Complete by:  As directed      Increase activity slowly    Complete by:  As directed               Medication List    TAKE these medications        amLODipine 10 MG tablet  Commonly known as:  NORVASC  Take 1 tablet (10 mg total) by mouth daily.     aspirin 325 MG tablet  Take 1 tablet (325 mg total) by mouth 2 (two) times daily.     calcitRIOL 0.5 MCG capsule  Commonly known as:  ROCALTROL  Take 1 capsule (0.5 mcg total) by mouth daily.     carvedilol 25 MG tablet  Commonly known as:  COREG  Take 1 tablet (25 mg total) by mouth 2 (two) times daily with a meal.     cloNIDine 0.1 MG tablet  Commonly known as:  CATAPRES  Take 1 tablet (0.1 mg total) by mouth 2 (two) times daily.     DSS 100 MG Caps  Take 100 mg by mouth 2 (two) times daily.     feeding supplement (RESOURCE BREEZE) Liqd  Take 1 Container by mouth 3 (three) times daily between meals.     hydrALAZINE 25 MG tablet  Commonly known as:  APRESOLINE  Take 1 tablet (25 mg total) by mouth 3 (three) times daily.     labetalol 200 MG tablet  Commonly known as:  NORMODYNE  Take 2 tablets (400 mg total) by mouth 2 (two) times daily.     lisinopril 10 MG tablet  Commonly known as:  PRINIVIL,ZESTRIL  Take 1 tablet (10 mg total) by mouth daily.     multivitamin Tabs tablet  Take 1 tablet by mouth daily.     pantoprazole 40 MG tablet  Commonly known as:  PROTONIX  Take 1 tablet (40 mg total) by mouth daily at 12 noon.     polyethylene glycol packet  Commonly known as:  MIRALAX / GLYCOLAX  Take 17 g by mouth daily.     rOPINIRole 0.25 MG tablet  Commonly known as:  REQUIP  Take 0.25-0.5 mg by mouth at bedtime. Takes depending on bad restless legs are.     sevelamer carbonate 800 MG tablet  Commonly known as:  RENVELA  Take 2,400-4,000 mg by mouth 3 (three) times daily with meals. To take 5 with meals and 2 with snacks     simvastatin 40 MG tablet  Commonly known as:  ZOCOR  Take 1 tablet (40 mg total) by mouth every Monday, Wednesday, and Friday.       No Known Allergies     Follow-up Information     Follow up with [name], MD In 1 month.   Specialty:  Internal Medicine   Why:  As needed   Contact information:   [place] [address] [phone]        The results of significant diagnostics from this hospitalization (including imaging, microbiology, ancillary and laboratory) are listed below for reference.    Significant Diagnostic Studies: Nm Gi Blood Loss  09/28/2014   CLINICAL DATA:  Gastrointestinal bleeding.  EXAM: NUCLEAR MEDICINE GASTROINTESTINAL BLEEDING SCAN  TECHNIQUE: Sequential abdominal images were obtained following intravenous administration of Tc-43m  labeled red blood cells.  RADIOPHARMACEUTICALS:  25.0 mCi Tc-13m in-vitro labeled red cells.  COMPARISON:  [facility] bleeding scan 09/24/2014  FINDINGS: No abnormal accumulation of radiotracer within the abdomen or pelvis to suggest active bleeding into the small or large bowel. Physiologic activity is noted in the solid organs, blood pool, and genitalia.  IMPRESSION: No scintigraphic evidence of active gastrointestinal bleeding.   Electronically Signed   By: Suzy Bouchard M.D.   On: 09/28/2014 17:31   Nm Gi Blood Loss  09/24/2014   CLINICAL DATA:  Hematochezia, PT Hx of Hypertensive Heart Disease, Hypercholesterolemia, Secondary Cardiomyopathy, Anemia, HTN, Peripheral Vascular Disease, ESRD (on peritoneal dialysis) Pericarditis, NSTEMI  EXAM: NUCLEAR MEDICINE GASTROINTESTINAL BLEEDING SCAN  TECHNIQUE: Sequential abdominal images were obtained following intravenous administration of Tc-73m labeled red blood cells.  RADIOPHARMACEUTICALS:  25.0 mCi Tc-51m in-vitro labeled red cells.  COMPARISON:  None.  FINDINGS: Physiologic distribution of radiopharmaceutical. No evidence of active extravasation.  IMPRESSION: Negative   Electronically Signed   By: Arne Cleveland M.D.   On: 09/24/2014 14:40   Dg Chest Port 1 View  09/22/2014   CLINICAL DATA:  81 year old male with history of rectal bleeding,  dizziness, nausea and increased blood pressure. Evaluate for potential pulmonary edema.  EXAM: PORTABLE CHEST - 1 VIEW  COMPARISON:  Chest x-ray 08/29/2014.  FINDINGS: Previously noted pulmonary edema has resolved. There is a retrocardiac opacity completely obscuring the left hemidiaphragm, compatible with atelectasis and/or consolidation in the left lower lobe with small left-sided pleural effusion. Right lung is clear. Heart size is mildly enlarged. Upper mediastinal contours are within normal limits. Atherosclerosis in the thoracic aorta.  IMPRESSION: 1. Resolution of previously noted pulmonary edema. 2. Atelectasis and/or consolidation in the left lower lobe with small left-sided pleural effusion. 3. Atherosclerosis. 4. Mild cardiomegaly.   Electronically Signed   By: Vinnie Langton M.D.   On: 09/22/2014 16:34    Microbiology: No results found for this or any previous visit (from the past 240 hour(s)).   Labs: Basic Metabolic Panel:  Recent Labs Lab 09/24/14 0545 09/25/14 0533 09/26/14 0515 09/27/14 0530  NA 135 135 135 137  K 4.1 3.6 3.6 3.7  CL 98 98 96 100  CO2 20 26 27 24   GLUCOSE 118* 115* 121* 121*  BUN 54* 47* 41* 37*  CREATININE 14.04* 13.66* 13.66* 13.65*  CALCIUM 8.7 8.5 8.4 8.7   Liver Function Tests: No results for input(s): AST, ALT, ALKPHOS, BILITOT, PROT, ALBUMIN in the last 168 hours. No results for input(s): LIPASE, AMYLASE in the last 168 hours. No results for input(s): AMMONIA in the last 168 hours. CBC:  Recent Labs Lab 09/24/14 0545 09/25/14 0533 09/26/14 0515 09/26/14 1909 09/27/14 0530 09/28/14 0537 09/28/14 2225 09/29/14 1443 09/30/14 0634  WBC 8.2 5.8 5.3 5.3 5.1 5.4 6.2 5.7 6.6  NEUTROABS 6.4 4.2 3.8 3.7 3.7  --   --   --   --   HGB 7.6* 7.5* 7.0* 8.1* 8.4* 7.4* 9.1* 8.8* 8.6*  HCT 23.8* 23.8* 22.4* 24.9* 25.7* 23.9* 28.8* 27.0* 27.5*  MCV 88.1 87.2 87.8 86.5 88.6 87.5 86.0 87.1 86.5  PLT 226 204 222 211 234 251 258 242 276   Cardiac  Enzymes: No results for input(s): CKTOTAL, CKMB, CKMBINDEX, TROPONINI in the last 168 hours. BNP: BNP (last 3 results)  Recent Labs  06/21/14 1046 08/17/14 0625 08/29/14 0928  PROBNP >70000.0* >70000.0* >70000.0*   CBG: No results for input(s): GLUCAP in the last 168 hours.  Signed:  Annita Brod  [facility] 09/30/2014, 6:17 PM

## 2014-09-30 NOTE — Care Management Note (Signed)
CARE MANAGEMENT NOTE 09/30/2014  Patient:  Randy [name], Randy [name]   Account Number:  [id number]  Date Initiated:  09/22/2014  Documentation initiated by:  Elissa Hefty  Subjective/Objective Assessment:   adm w gi bleed     Action/Plan:   lives w wife, pcp dr don Bea Graff  09/30/2014 No HH needs identified, pt is ambulatory and does his own PD.   Anticipated DC Date:  09/30/2014   Anticipated DC Plan:  HOME/SELF CARE         Choice offered to / List presented to:             Status of service:  Completed, signed off Medicare Important Message given?  YES (If response is "NO", the following Medicare IM given date fields will be blank) Date Medicare IM given:  09/26/2014 Medicare IM given by:  Leenah Seidner Date Additional Medicare IM given:  09/30/2014 Additional Medicare IM given by:  [facility]  Discharge Disposition:  HOME/SELF CARE  Per UR Regulation:  Reviewed for med. necessity/level of care/duration of stay  If discussed at [place] of Stay Meetings, dates discussed:    Comments:

## 2014-09-30 NOTE — Discharge Instructions (Signed)
Diverticulosis Diverticulosis is the condition that develops when small pouches (diverticula) form in the wall of your colon. Your colon, or large intestine, is where water is absorbed and stool is formed. The pouches form when the inside layer of your colon pushes through weak spots in the outer layers of your colon. CAUSES  No one knows exactly what causes diverticulosis. RISK FACTORS  Being older than 50. Your risk for this condition increases with age. Diverticulosis is rare in people younger than 40 years. By age 80, almost everyone has it.  Eating a low-fiber diet.  Being frequently constipated.  Being overweight.  Not getting enough exercise.  Smoking.  Taking over-the-counter pain medicines, like aspirin and ibuprofen. SYMPTOMS  Most people with diverticulosis do not have symptoms. DIAGNOSIS  Because diverticulosis often has no symptoms, health care providers often discover the condition during an exam for other colon problems. In many cases, a health care provider will diagnose diverticulosis while using a flexible scope to examine the colon (colonoscopy). TREATMENT  If you have never developed an infection related to diverticulosis, you may not need treatment. If you have had an infection before, treatment may include:  Eating more fruits, vegetables, and grains.  Taking a fiber supplement.  Taking a live bacteria supplement (probiotic).  Taking medicine to relax your colon. HOME CARE INSTRUCTIONS   Drink at least 6-8 glasses of water each day to prevent constipation.  Try not to strain when you have a bowel movement.  Keep all follow-up appointments. If you have had an infection before:  Increase the fiber in your diet as directed by your health care provider or dietitian.  Take a dietary fiber supplement if your health care provider approves.  Only take medicines as directed by your health care provider. SEEK MEDICAL CARE IF:   You have abdominal  pain.  You have bloating.  You have cramps.  You have not gone to the bathroom in 3 days. SEEK IMMEDIATE MEDICAL CARE IF:   Your pain gets worse.  Yourbloating becomes very bad.  You have a fever or chills, and your symptoms suddenly get worse.  You begin vomiting.  You have bowel movements that are bloody or black. MAKE SURE YOU:  Understand these instructions.  Will watch your condition.  Will get help right away if you are not doing well or get worse. Document Released: 06/06/2004 Document Revised: 09/14/2013 Document Reviewed: 08/04/2013 [employer] Patient Information 2015 [employer]. This information is not intended to replace advice given to you by your health care provider. Make sure you discuss any questions you have with your health care provider.  

## 2014-10-08 ENCOUNTER — Emergency Department (HOSPITAL_COMMUNITY)
Admission: EM | Admit: 2014-10-08 | Discharge: 2014-10-08 | Disposition: A | Discharge status: Home / Self Care | Payer: Medicare Other | Attending: Emergency Medicine | Admitting: Emergency Medicine

## 2014-10-08 ENCOUNTER — Encounter (HOSPITAL_COMMUNITY): Payer: Self-pay | Admitting: Emergency Medicine

## 2014-10-08 DIAGNOSIS — Z87891 Personal history of nicotine dependence: Secondary | ICD-10-CM | POA: Diagnosis not present

## 2014-10-08 DIAGNOSIS — Z9981 Dependence on supplemental oxygen: Secondary | ICD-10-CM | POA: Insufficient documentation

## 2014-10-08 DIAGNOSIS — Z9861 Coronary angioplasty status: Secondary | ICD-10-CM | POA: Insufficient documentation

## 2014-10-08 DIAGNOSIS — I252 Old myocardial infarction: Secondary | ICD-10-CM | POA: Diagnosis not present

## 2014-10-08 DIAGNOSIS — Z79899 Other long term (current) drug therapy: Secondary | ICD-10-CM | POA: Diagnosis not present

## 2014-10-08 DIAGNOSIS — M7981 Nontraumatic hematoma of soft tissue: Secondary | ICD-10-CM | POA: Insufficient documentation

## 2014-10-08 DIAGNOSIS — R42 Dizziness and giddiness: Secondary | ICD-10-CM | POA: Diagnosis not present

## 2014-10-08 DIAGNOSIS — I119 Hypertensive heart disease without heart failure: Secondary | ICD-10-CM | POA: Diagnosis not present

## 2014-10-08 DIAGNOSIS — M1990 Unspecified osteoarthritis, unspecified site: Secondary | ICD-10-CM | POA: Insufficient documentation

## 2014-10-08 DIAGNOSIS — D631 Anemia in chronic kidney disease: Secondary | ICD-10-CM | POA: Insufficient documentation

## 2014-10-08 DIAGNOSIS — L988 Other specified disorders of the skin and subcutaneous tissue: Secondary | ICD-10-CM | POA: Diagnosis present

## 2014-10-08 DIAGNOSIS — N186 End stage renal disease: Secondary | ICD-10-CM | POA: Insufficient documentation

## 2014-10-08 DIAGNOSIS — Z992 Dependence on renal dialysis: Secondary | ICD-10-CM | POA: Insufficient documentation

## 2014-10-08 DIAGNOSIS — Z7982 Long term (current) use of aspirin: Secondary | ICD-10-CM | POA: Diagnosis not present

## 2014-10-08 DIAGNOSIS — T148XXA Other injury of unspecified body region, initial encounter: Secondary | ICD-10-CM

## 2014-10-08 DIAGNOSIS — Z9889 Other specified postprocedural states: Secondary | ICD-10-CM | POA: Diagnosis not present

## 2014-10-08 DIAGNOSIS — Z8701 Personal history of pneumonia (recurrent): Secondary | ICD-10-CM | POA: Diagnosis not present

## 2014-10-08 DIAGNOSIS — I120 Hypertensive chronic kidney disease with stage 5 chronic kidney disease or end stage renal disease: Secondary | ICD-10-CM | POA: Diagnosis not present

## 2014-10-08 NOTE — Discharge Instructions (Signed)
Please call your doctor for a followup appointment within 24-48 hours. When you talk to your doctor please let them know that you were seen in the emergency department and have them acquire all of your records so that they can discuss the findings with you and formulate a treatment plan to fully care for your new and ongoing problems. Please follow-up with primary care provider this week Please apply cold compressions to the left cheek and inner aspect to aid in reduction of bleeding Please do not take aspirin today and tomorrow, start aspirin up again on Monday Please continue to monitor symptoms closely and if symptoms are to worsen or change (fever greater than 101, chills, sweating, nausea, vomiting, chest pain, shortness of breathe, difficulty breathing, weakness, numbness, tingling, worsening or changes to pain pattern, facial swelling, increased bleeding, numbness or tingling to the left side of the face, visual changes, dizziness, fainting) please report back to the Emergency Department immediately.    Hematoma A hematoma is a collection of blood under the skin, in an organ, in a body space, in a joint space, or in other tissue. The blood can clot to form a lump that you can see and feel. The lump is often firm and may sometimes become sore and tender. Most hematomas get better in a few days to weeks. However, some hematomas may be serious and require medical care. Hematomas can range in size from very small to very large. CAUSES  A hematoma can be caused by a blunt or penetrating injury. It can also be caused by spontaneous leakage from a blood vessel under the skin. Spontaneous leakage from a blood vessel is more likely to occur in older people, especially those taking blood thinners. Sometimes, a hematoma can develop after certain medical procedures. SIGNS AND SYMPTOMS   A firm lump on the body.  Possible pain and tenderness in the area.  Bruising.Blue, dark blue, purple-red, or  yellowish skin may appear at the site of the hematoma if the hematoma is close to the surface of the skin. For hematomas in deeper tissues or body spaces, the signs and symptoms may be subtle. For example, an intra-abdominal hematoma may cause abdominal pain, weakness, fainting, and shortness of breath. An intracranial hematoma may cause a headache or symptoms such as weakness, trouble speaking, or a change in consciousness. DIAGNOSIS  A hematoma can usually be diagnosed based on your medical history and a physical exam. Imaging tests may be needed if your health care provider suspects a hematoma in deeper tissues or body spaces, such as the abdomen, head, or chest. These tests may include ultrasonography or a CT scan.  TREATMENT  Hematomas usually go away on their own over time. Rarely does the blood need to be drained out of the body. Large hematomas or those that may affect vital organs will sometimes need surgical drainage or monitoring. HOME CARE INSTRUCTIONS   Apply ice to the injured area:   Put ice in a plastic bag.   Place a towel between your skin and the bag.   Leave the ice on for 20 minutes, 2-3 times a day for the first 1 to 2 days.   After the first 2 days, switch to using warm compresses on the hematoma.   Elevate the injured area to help decrease pain and swelling. Wrapping the area with an elastic bandage may also be helpful. Compression helps to reduce swelling and promotes shrinking of the hematoma. Make sure the bandage is not wrapped too  tight.   If your hematoma is on a lower extremity and is painful, crutches may be helpful for a couple days.   Only take over-the-counter or prescription medicines as directed by your health care provider. SEEK IMMEDIATE MEDICAL CARE IF:   You have increasing pain, or your pain is not controlled with medicine.   You have a fever.   You have worsening swelling or discoloration.   Your skin over the hematoma breaks or  starts bleeding.   Your hematoma is in your chest or abdomen and you have weakness, shortness of breath, or a change in consciousness.  Your hematoma is on your scalp (caused by a fall or injury) and you have a worsening headache or a change in alertness or consciousness. MAKE SURE YOU:   Understand these instructions.  Will watch your condition.  Will get help right away if you are not doing well or get worse. Document Released: 04/23/2004 Document Revised: 05/12/2013 Document Reviewed: 02/17/2013 [facility] Patient Information 2015 [place], [place]. This information is not intended to replace advice given to you by your health care provider. Make sure you discuss any questions you have with your health care provider.

## 2014-10-08 NOTE — ED Notes (Signed)
Pt states he felt a blister swelling to inside of left jaw this am. Pt states it has been bleeding x 30 mins. Airway patent.

## 2014-10-08 NOTE — ED Provider Notes (Signed)
CSN: [id number]     Arrival date & time 10/08/14  1015 History   First MD Initiated Contact with Patient 10/08/14 1028     Chief Complaint  Patient presents with  . Blister     (Consider location/radiation/quality/duration/timing/severity/associated sxs/prior Treatment) The history is provided by the patient. No language interpreter was used.  Randy [name] is an 81 y/o M with PMHx of hypertension, hypercholesterolemia, gout, secondary card myopathy, arthritis, end-stage renal disease, pneumonia, pericarditis, NSTEMI in November 2015 with left heart catheterization performed presenting to emergency department with bleeding to the mouth that occurred this morning while the patient was eating breakfast. Patient reported that the bleeding started approximately 40 minutes ago and has not stopped. Patient is currently on aspirin 81 mg daily. Patient reported that he does have dentures. Patient reported that he had some pain on the left side of his mouth with sudden onset of bleeding-reported that it was a blister that popped. Stated that he's been feeling mildly dizzy since the event. Denied pain, numbness, tingling, fainting, chest pain, shortness of breath, difficulty breathing. PCP Dr. Lorriane Shire  Past Medical History  Diagnosis Date  . Hypertensive heart disease   . Hypercholesterolemia   . Gout   . Secondary cardiomyopathy     LVEF 40-45%  . Anemia of chronic disease   . Hypertension   . Arthritis   . Peripheral vascular disease   . ESRD on peritoneal dialysis   . On home oxygen therapy     "use what I need to" (08/17/2014)  . Pneumonia 09/2012  . Pericarditis 08/17/2014  . NSTEMI (non-ST elevated myocardial infarction) 08/17/2014   Past Surgical History  Procedure Laterality Date  . Knee arthroscopy Right 2007  . Back surgery    . Hemorrhoid surgery  1970's  . Ganglion cyst excision  01/03/2012    Procedure: REMOVAL GANGLION OF WRIST;  Surgeon: Scherry Ran, MD;  Location:  [facility];  Service: General;  Laterality: Right;  . Colonoscopy    . Av fistula placement  08/24/2012    Procedure: ARTERIOVENOUS (AV) FISTULA CREATION;  Surgeon: Rosetta Posner, MD;  Location: [place];  Service: Vascular;  Laterality: Left;  . Insertion of dialysis catheter Right      neck  . Insertion of dialysis catheter  10/19/2012    Procedure: INSERTION OF DIALYSIS CATHETER;  Surgeon: Rosetta Posner, MD;  Location: [place];  Service: Vascular;  Laterality: N/A;  REMOVE TEMPORARY CATH  . Cardiac catheterization  08/17/2014  . Cholecystectomy    . Lumbar disc surgery  2004  . Left heart catheterization with coronary angiogram N/A 08/17/2014    Procedure: LEFT HEART CATHETERIZATION WITH CORONARY ANGIOGRAM;  Surgeon: Larey Dresser, MD;  Location[facility];  Service: Cardiovascular;  Laterality: N/A;  . Colonoscopy Left 09/26/2014    Procedure: COLONOSCOPY;  Surgeon: Arta Silence, MD;  Location: [facility] ENDOSCOPY;  Service: Endoscopy;  Laterality: Left;   Family History  Problem Relation Age of Onset  . Arthritis    . Cancer    . Kidney disease    . Anesthesia problems Neg Hx   . Hypotension Neg Hx   . Malignant hyperthermia Neg Hx   . Pseudochol deficiency Neg Hx   . Cancer Sister   . Cancer Brother     colon  . Cancer Brother     colon   History  Substance Use Topics  . Smoking status: Former Smoker -- 1.00 packs/day for 30 years  Types: Cigarettes    Quit date: 08/19/1998  . Smokeless tobacco: Former Systems developer    Types: Chew    Quit date: 12/31/1993  . Alcohol Use: No    Review of Systems  HENT: Negative for trouble swallowing.   Respiratory: Negative for chest tightness and shortness of breath.   Cardiovascular: Negative for chest pain.  Skin: Positive for wound.  Neurological: Positive for dizziness. Negative for weakness, numbness and headaches.      Allergies  Review of patient's allergies indicates no known allergies.  Home Medications   Prior to Admission  medications   Medication Sig Start Date End Date Taking? Authorizing Provider  amLODipine (NORVASC) 10 MG tablet Take 1 tablet (10 mg total) by mouth daily. 06/23/14  Yes [name], MD  aspirin 325 MG tablet Take 1 tablet (325 mg total) by mouth 2 (two) times daily. 08/23/14  Yes Rhonda G Barrett, PA-C  calcitRIOL (ROCALTROL) 0.5 MCG capsule Take 1 capsule (0.5 mcg total) by mouth daily. 06/23/14  Yes [name], MD  carvedilol (COREG) 25 MG tablet Take 1 tablet (25 mg total) by mouth 2 (two) times daily with a meal. 09/02/14  Yes [name], MD  cloNIDine (CATAPRES) 0.1 MG tablet Take 1 tablet (0.1 mg total) by mouth 2 (two) times daily. 08/23/14  Yes Rhonda G Barrett, PA-C  docusate sodium 100 MG CAPS Take 100 mg by mouth 2 (two) times daily. 08/23/14  Yes Rhonda G Barrett, PA-C  hydrALAZINE (APRESOLINE) 25 MG tablet Take 1 tablet (25 mg total) by mouth 3 (three) times daily. 08/23/14  Yes Rhonda G Barrett, PA-C  labetalol (NORMODYNE) 200 MG tablet Take 2 tablets (400 mg total) by mouth 2 (two) times daily. Patient taking differently: Take 200 mg by mouth at bedtime.  07/17/12  Yes Satira Sark, MD  lisinopril (PRINIVIL,ZESTRIL) 10 MG tablet Take 1 tablet (10 mg total) by mouth daily. 09/02/14  Yes [name], MD  multivitamin (RENA-VIT) TABS tablet Take 1 tablet by mouth daily.   Yes Historical Provider, MD  pantoprazole (PROTONIX) 40 MG tablet Take 1 tablet (40 mg total) by mouth daily at 12 noon. 08/23/14  Yes Rhonda G Barrett, PA-C  polyethylene glycol (MIRALAX / GLYCOLAX) packet Take 17 g by mouth daily. 08/23/14  Yes Rhonda G Barrett, PA-C  rOPINIRole (REQUIP) 0.25 MG tablet Take 0.25-0.5 mg by mouth at bedtime. Takes depending on bad restless legs are.   Yes Historical Provider, MD  sevelamer carbonate (RENVELA) 800 MG tablet Take 2,400-4,000 mg by mouth 3 (three) times daily with meals. To take 5 with meals and 2 with snacks   Yes Historical Provider, MD   simvastatin (ZOCOR) 40 MG tablet Take 1 tablet (40 mg total) by mouth every Monday, Wednesday, and Friday. 08/23/14  Yes Rhonda G Barrett, PA-C  feeding supplement, RESOURCE BREEZE, (RESOURCE BREEZE) LIQD Take 1 Container by mouth 3 (three) times daily between meals. Patient not taking: Reported on 10/08/2014 09/30/14   Annita Brod, MD   BP 168/77 mmHg  Pulse 78  Temp(Src) 98 F (36.7 C)  Resp 20  Ht 5\' 9"  (1.753 m)  Wt 156 lb (70.761 kg)  BMI 23.03 kg/m2  SpO2 100% Physical Exam  Constitutional: He is oriented to person, place, and time. He appears well-developed and well-nourished. No distress.  HENT:  Head: Normocephalic and atraumatic.  Approximately 2.5 cm x 2.5 cm hematoma identified to the left nuchal mucosa with active bleeding noted. Dentures identified  and then placed. Negative trismus.  Eyes: Conjunctivae and EOM are normal. Pupils are equal, round, and reactive to light. Right eye exhibits no discharge. Left eye exhibits no discharge.  Neck: Normal range of motion. Neck supple. No tracheal deviation present.  Cardiovascular: Normal rate, regular rhythm and normal heart sounds.  Exam reveals no friction rub.   No murmur heard. Pulmonary/Chest: Effort normal and breath sounds normal. No respiratory distress. He has no wheezes. He has no rales.  Musculoskeletal: Normal range of motion.  Full ROM to upper and lower extremities without difficulty noted, negative ataxia noted.  Lymphadenopathy:    He has no cervical adenopathy.  Neurological: He is alert and oriented to person, place, and time. No cranial nerve deficit. He exhibits normal muscle tone. Coordination normal.  Cranial nerves III-XII grossly intact Patient follows commands well Patient response to questions appropriately Negative facial droop Negative slurred speech Negative aphasia  Skin: Skin is warm and dry. No rash noted. He is not diaphoretic. No erythema.  Psychiatric: He has a normal mood and affect.  His behavior is normal. Thought content normal.  Nursing note and vitals reviewed.   ED Course  Procedures (including critical care time) Labs Review Labs Reviewed - No data to display  Imaging Review No results found.   EKG Interpretation None      11:49 AM Patient seen and assessed by attending physician, Dr. Lindajo Royal. Tried to express some blood clots out of the hematoma, but unable too. Patient to be discharged as per instructions from attending physician. Recommended patient to hold his ASA for today and tomorrow - start up again on Monday. Referred to PCP.   MDM   Final diagnoses:  Hematoma    Medications - No data to display  Filed Vitals:   10/08/14 1023  BP: 168/77  Pulse: 78  Temp: 98 F (36.7 C)  Resp: 20  Height: 5\' 9"  (1.753 m)  Weight: 156 lb (70.761 kg)  SpO2: 100%    Patient presenting to the emergency department with a hematoma to the left buccal mucosa - upon arrival to the ED, hematoma was actively bleeding. Over time bleeding has been controlled. Site explored for possible removal blood clots-no blood clots to be removed at this time. Suspicion to be patient biting down at some point in time resulting in a hematoma to form in the mouth. Patient seen and assessed by attending physician, Dr. Nat Christen, as per physician cleared patient for discharge. Patient stable, afebrile. Patient septic appearing. Negative focal neurological deficits. Discharged patient. Discussed with patient to rest and stay hydrated. Discussed with patient to not take aspirin today and tomorrow, to start aspirin again on Monday. Referred to primary care provider to be seen and assessed. Discussed with patient to closely monitor symptoms and if symptoms are to worsen or change to report back to the ED - strict return instructions given.  Patient agreed to plan of care, understood, all questions answered.    Jamse Mead, PA-C 10/08/14 [place], MD 10/09/14 [phone]

## 2014-10-20 ENCOUNTER — Emergency Department (HOSPITAL_COMMUNITY)
Admission: EM | Admit: 2014-10-20 | Discharge: 2014-10-20 | Disposition: A | Discharge status: Home / Self Care | Payer: Medicare Other | Attending: Emergency Medicine | Admitting: Emergency Medicine

## 2014-10-20 ENCOUNTER — Encounter (HOSPITAL_COMMUNITY): Payer: Self-pay

## 2014-10-20 ENCOUNTER — Emergency Department (HOSPITAL_COMMUNITY): Payer: Medicare Other

## 2014-10-20 DIAGNOSIS — Z8701 Personal history of pneumonia (recurrent): Secondary | ICD-10-CM | POA: Insufficient documentation

## 2014-10-20 DIAGNOSIS — R000 Tachycardia, unspecified: Secondary | ICD-10-CM | POA: Insufficient documentation

## 2014-10-20 DIAGNOSIS — Z862 Personal history of diseases of the blood and blood-forming organs and certain disorders involving the immune mechanism: Secondary | ICD-10-CM | POA: Diagnosis not present

## 2014-10-20 DIAGNOSIS — Z992 Dependence on renal dialysis: Secondary | ICD-10-CM | POA: Insufficient documentation

## 2014-10-20 DIAGNOSIS — Z9981 Dependence on supplemental oxygen: Secondary | ICD-10-CM | POA: Insufficient documentation

## 2014-10-20 DIAGNOSIS — I252 Old myocardial infarction: Secondary | ICD-10-CM | POA: Insufficient documentation

## 2014-10-20 DIAGNOSIS — I1311 Hypertensive heart and chronic kidney disease without heart failure, with stage 5 chronic kidney disease, or end stage renal disease: Secondary | ICD-10-CM | POA: Diagnosis not present

## 2014-10-20 DIAGNOSIS — N186 End stage renal disease: Secondary | ICD-10-CM

## 2014-10-20 DIAGNOSIS — J40 Bronchitis, not specified as acute or chronic: Secondary | ICD-10-CM

## 2014-10-20 DIAGNOSIS — Z7982 Long term (current) use of aspirin: Secondary | ICD-10-CM | POA: Insufficient documentation

## 2014-10-20 DIAGNOSIS — E785 Hyperlipidemia, unspecified: Secondary | ICD-10-CM | POA: Insufficient documentation

## 2014-10-20 DIAGNOSIS — Z9889 Other specified postprocedural states: Secondary | ICD-10-CM | POA: Diagnosis not present

## 2014-10-20 DIAGNOSIS — R0602 Shortness of breath: Secondary | ICD-10-CM | POA: Diagnosis present

## 2014-10-20 DIAGNOSIS — Z79899 Other long term (current) drug therapy: Secondary | ICD-10-CM | POA: Insufficient documentation

## 2014-10-20 DIAGNOSIS — Z87891 Personal history of nicotine dependence: Secondary | ICD-10-CM | POA: Insufficient documentation

## 2014-10-20 LAB — I-STAT TROPONIN, ED
TROPONIN I, POC: 0.14 ng/mL — AB (ref 0.00–0.08)
Troponin i, poc: 0.16 ng/mL (ref 0.00–0.08)

## 2014-10-20 LAB — HEPATIC FUNCTION PANEL
ALT: 14 U/L (ref 0–53)
AST: 18 U/L (ref 0–37)
Albumin: 2.8 g/dL — ABNORMAL LOW (ref 3.5–5.2)
Alkaline Phosphatase: 61 U/L (ref 39–117)
BILIRUBIN DIRECT: 0.1 mg/dL (ref 0.0–0.5)
Indirect Bilirubin: 0.4 mg/dL (ref 0.3–0.9)
TOTAL PROTEIN: 6.7 g/dL (ref 6.0–8.3)
Total Bilirubin: 0.5 mg/dL (ref 0.3–1.2)

## 2014-10-20 LAB — I-STAT CHEM 8, ED
BUN: 63 mg/dL — ABNORMAL HIGH (ref 6–23)
CREATININE: 14.4 mg/dL — AB (ref 0.50–1.35)
Calcium, Ion: 1.09 mmol/L — ABNORMAL LOW (ref 1.13–1.30)
Chloride: 100 mmol/L (ref 96–112)
Glucose, Bld: 97 mg/dL (ref 70–99)
HCT: 27 % — ABNORMAL LOW (ref 39.0–52.0)
HEMOGLOBIN: 9.2 g/dL — AB (ref 13.0–17.0)
Potassium: 4.4 mmol/L (ref 3.5–5.1)
SODIUM: 136 mmol/L (ref 135–145)
TCO2: 25 mmol/L (ref 0–100)

## 2014-10-20 LAB — CBC WITH DIFFERENTIAL/PLATELET
BASOS PCT: 0 % (ref 0–1)
Basophils Absolute: 0 10*3/uL (ref 0.0–0.1)
EOS PCT: 2 % (ref 0–5)
Eosinophils Absolute: 0.2 10*3/uL (ref 0.0–0.7)
HEMATOCRIT: 27.3 % — AB (ref 39.0–52.0)
HEMOGLOBIN: 8.4 g/dL — AB (ref 13.0–17.0)
Lymphocytes Relative: 10 % — ABNORMAL LOW (ref 12–46)
Lymphs Abs: 1 10*3/uL (ref 0.7–4.0)
MCH: 28.5 pg (ref 26.0–34.0)
MCHC: 30.8 g/dL (ref 30.0–36.0)
MCV: 92.5 fL (ref 78.0–100.0)
MONO ABS: 0.7 10*3/uL (ref 0.1–1.0)
Monocytes Relative: 7 % (ref 3–12)
NEUTROS PCT: 81 % — AB (ref 43–77)
Neutro Abs: 7.9 10*3/uL — ABNORMAL HIGH (ref 1.7–7.7)
Platelets: 305 10*3/uL (ref 150–400)
RBC: 2.95 MIL/uL — AB (ref 4.22–5.81)
RDW: 20 % — ABNORMAL HIGH (ref 11.5–15.5)
WBC: 9.8 10*3/uL (ref 4.0–10.5)

## 2014-10-20 LAB — SAMPLE TO BLOOD BANK

## 2014-10-20 MED ORDER — ALBUTEROL SULFATE (2.5 MG/3ML) 0.083% IN NEBU
5.0000 mg | INHALATION_SOLUTION | Freq: Once | RESPIRATORY_TRACT | Status: DC
Start: 2014-10-20 — End: 2014-10-20

## 2014-10-20 MED ORDER — IPRATROPIUM BROMIDE 0.02 % IN SOLN: 0.5000 mg | Freq: Once | RESPIRATORY_TRACT | Status: DC

## 2014-10-20 MED ORDER — ALBUTEROL SULFATE (2.5 MG/3ML) 0.083% IN NEBU
2.5000 mg | INHALATION_SOLUTION | Freq: Once | RESPIRATORY_TRACT | Status: AC
  Administered 2014-10-20: 2.5 mg via RESPIRATORY_TRACT
  Filled 2014-10-20: qty 3

## 2014-10-20 MED ORDER — IPRATROPIUM-ALBUTEROL 0.5-2.5 (3) MG/3ML IN SOLN
3.0000 mL | Freq: Once | RESPIRATORY_TRACT | Status: AC
Start: 2014-10-20 — End: 2014-10-20
  Administered 2014-10-20: 3 mL via RESPIRATORY_TRACT
  Filled 2014-10-20: qty 3

## 2014-10-20 NOTE — ED Notes (Addendum)
Pt c/o of SOB that started today, states he took one albuterol treatment at home with minimal relief. RR 30's stat 80% RA, dyspnea, accessory muscle use and speaking in broken sentences. Pt on Peritoneal Dialysis at home, which he does every night, has not missed any treatments. Pt taken straight back to room 08, placed on 3L/[place] stat increased to 99%. triage completed in room. 12 lead completed.

## 2014-10-20 NOTE — Discharge Instructions (Signed)
Use your albuterol nebulizer every 3-4 hours as needed for cough or trouble breathing. User oxygen, for comfort, as usual. You can use one yellow bag, and 1 Green bag, tonight for dialysis, to try and take off a little more fluid. Call your kidney doctor in the morning, to touch base and see what else he recommends.   End-Stage Kidney Disease The kidneys are two organs that lie on either side of the spine between the middle of the back and the front of the abdomen. The kidneys:   Remove wastes and extra water from the blood.   Produce important hormones. These help keep bones strong, regulate blood pressure, and help create red blood cells.   Balance the fluids and chemicals in the blood and tissues. End-stage kidney disease occurs when the kidneys are so damaged that they cannot do their job. When the kidneys cannot do their job, life-threatening problems occur. The body cannot stay clean and strong without the help of the kidneys. In end-stage kidney disease, the kidneys cannot get better.You need a new kidney or treatments to do some of the work healthy kidneys do in order to stay alive. CAUSES  End-stage kidney disease usually occurs when a long-lasting (chronic) kidney disease gets worse. It may also occur after the kidneys are suddenly damaged (acute kidney injury).  SYMPTOMS   Swelling (edema) of the legs, ankles, or feet.   Tiredness (lethargy).   Nausea or vomiting.   Confusion.   Problems with urination, such as:   Decreased urine production.   Frequent urination, especially at night.   Frequent accidents in children who are potty trained.   Muscle twitches and cramps.   Persistent itchiness.   Loss of appetite.   Headaches.   Abnormally dark or light skin.   Numbness in the hands or feet.   Easy bruising.   Frequent hiccups.   Menstruation stops. DIAGNOSIS  Your health care provider will measure your blood pressure and take some  tests. These may include:   Urine tests.   Blood tests.   Imaging tests, such as:   An ultrasound exam.   Computed tomography (CT).  A kidney biopsy. TREATMENT  There are two treatments for end-stage kidney disease:   A procedure that removes toxic wastes from the body (dialysis).   Receiving a new kidney (kidney transplant). Both of these treatments have serious risks and consequences. Your health care provider will help you determine which treatment is best for you based on your health, age, and other factors. In addition to having dialysis or a kidney transplant, you may need to take medicines to control high blood pressure (hypertension) and cholesterol and to decrease phosphorus levels in your blood.  HOME CARE INSTRUCTIONS  Follow your prescribed diet.   Take medicines only as directed by your health care provider.   Do not take any new medicines (prescription, over-the-counter, or nutritional supplements) unless approved by your health care provider. Many medicines can worsen your kidney damage or need to have the dose adjusted.   Keep all follow-up visits as directed by your health care provider. MAKE SURE YOU:  Understand these instructions.  Will watch your condition.  Will get help right away if you are not doing well or get worse. Document Released: 11/30/2003 Document Revised: 01/24/2014 Document Reviewed: 05/08/2012 [facility] Patient Information 2015 [place], [place]. This information is not intended to replace advice given to you by your health care provider. Make sure you discuss any questions you have with your  health care provider. ° °

## 2014-10-20 NOTE — ED Provider Notes (Signed)
CSN: [id number]     Arrival date & time 10/20/14  1445 History  This chart was scribed for Richarda Blade, MD by Einar Pheasant, ED Scribe. This patient was seen in room APA07/APA07 and the patient's care was started at 2:59 PM.    Chief Complaint  Patient presents with  . Shortness of Breath   The history is provided by the patient and medical records. No language interpreter was used.   HPI Comments: Randy [name] is a 81 y.o. male with PMhx of HTN and anemia of chronic disease listed below presents to the Emergency Department complaining of sudden onset SOB that started today approximately 2-3 hours ago. Pt states that he took one albuterol treatment at home with minimal relief. He is on Peritoneal dialysis at home which he does every day and night. Pt reports an associated productive cough with yellow sputum. Pt recently hospitalized for rectal bleeding. He is on 3L Oxygen at home. Pt denies fever, neck pain, sore throat, visual disturbance, CP, abdominal pain, nausea, emesis, diarrhea, urinary symptoms, back pain, HA, weakness, numbness and rash as associated symptoms.     Past Medical History  Diagnosis Date  . Hypertensive heart disease   . Hypercholesterolemia   . Gout   . Secondary cardiomyopathy     LVEF 40-45%  . Anemia of chronic disease   . Hypertension   . Arthritis   . Peripheral vascular disease   . ESRD on peritoneal dialysis   . On home oxygen therapy     "use what I need to" (08/17/2014)  . Pneumonia 09/2012  . Pericarditis 08/17/2014  . NSTEMI (non-ST elevated myocardial infarction) 08/17/2014   Past Surgical History  Procedure Laterality Date  . Knee arthroscopy Right 2007  . Back surgery    . Hemorrhoid surgery  1970's  . Ganglion cyst excision  01/03/2012    Procedure: REMOVAL GANGLION OF WRIST;  Surgeon: Scherry Ran, MD;  Location: [facility];  Service: General;  Laterality: Right;  . Colonoscopy    . Av fistula placement  08/24/2012    Procedure:  ARTERIOVENOUS (AV) FISTULA CREATION;  Surgeon: Rosetta Posner, MD;  Location: [place];  Service: Vascular;  Laterality: Left;  . Insertion of dialysis catheter Right      neck  . Insertion of dialysis catheter  10/19/2012    Procedure: INSERTION OF DIALYSIS CATHETER;  Surgeon: Rosetta Posner, MD;  Location: [place];  Service: Vascular;  Laterality: N/A;  REMOVE TEMPORARY CATH  . Cardiac catheterization  08/17/2014  . Cholecystectomy    . Lumbar disc surgery  2004  . Left heart catheterization with coronary angiogram N/A 08/17/2014    Procedure: LEFT HEART CATHETERIZATION WITH CORONARY ANGIOGRAM;  Surgeon: Larey Dresser, MD;  Location: [facility] CATH LAB;  Service: Cardiovascular;  Laterality: N/A;  . Colonoscopy Left 09/26/2014    Procedure: COLONOSCOPY;  Surgeon: Arta Silence, MD;  Location: [facility]OSCOPY;  Service: Endoscopy;  Laterality: Left;   Family History  Problem Relation Age of Onset  . Arthritis    . Cancer    . Kidney disease    . Anesthesia problems Neg Hx   . Hypotension Neg Hx   . Malignant hyperthermia Neg Hx   . Pseudochol deficiency Neg Hx   . Cancer Sister   . Cancer Brother     colon  . Cancer Brother     colon   History  Substance Use Topics  . Smoking status: Former Smoker --  1.00 packs/day for 30 years    Types: Cigarettes    Quit date: 08/19/1998  . Smokeless tobacco: Former Systems developer    Types: Chew    Quit date: 12/31/1993  . Alcohol Use: No    Review of Systems  Constitutional: Negative for fever and chills.  Respiratory: Positive for cough and shortness of breath.   Cardiovascular: Negative for chest pain.  Gastrointestinal: Negative for nausea, vomiting and abdominal pain.  Neurological: Negative for dizziness and light-headedness.  All other systems reviewed and are negative.  Allergies  Review of patient's allergies indicates no known allergies.  Home Medications   Prior to Admission medications   Medication Sig Start Date End Date Taking? Authorizing  Provider  amLODipine (NORVASC) 10 MG tablet Take 1 tablet (10 mg total) by mouth daily. 06/23/14  Yes [name], MD  aspirin 325 MG tablet Take 1 tablet (325 mg total) by mouth 2 (two) times daily. 08/23/14  Yes Rhonda G Barrett, PA-C  calcitRIOL (ROCALTROL) 0.5 MCG capsule Take 1 capsule (0.5 mcg total) by mouth daily. 06/23/14  Yes [name], MD  carvedilol (COREG) 25 MG tablet Take 1 tablet (25 mg total) by mouth 2 (two) times daily with a meal. 09/02/14  Yes [name], MD  cloNIDine (CATAPRES) 0.1 MG tablet Take 1 tablet (0.1 mg total) by mouth 2 (two) times daily. 08/23/14  Yes Rhonda G Barrett, PA-C  docusate sodium 100 MG CAPS Take 100 mg by mouth 2 (two) times daily. 08/23/14  Yes Rhonda G Barrett, PA-C  hydrALAZINE (APRESOLINE) 25 MG tablet Take 1 tablet (25 mg total) by mouth 3 (three) times daily. 08/23/14  Yes Rhonda G Barrett, PA-C  labetalol (NORMODYNE) 200 MG tablet Take 2 tablets (400 mg total) by mouth 2 (two) times daily. Patient taking differently: Take 200 mg by mouth at bedtime.  07/17/12  Yes Satira Sark, MD  lisinopril (PRINIVIL,ZESTRIL) 10 MG tablet Take 1 tablet (10 mg total) by mouth daily. 09/02/14  Yes [name], MD  multivitamin (RENA-VIT) TABS tablet Take 1 tablet by mouth daily.   Yes Historical Provider, MD  pantoprazole (PROTONIX) 40 MG tablet Take 1 tablet (40 mg total) by mouth daily at 12 noon. 08/23/14  Yes Rhonda G Barrett, PA-C  polyethylene glycol (MIRALAX / GLYCOLAX) packet Take 17 g by mouth daily. 08/23/14  Yes Rhonda G Barrett, PA-C  rOPINIRole (REQUIP) 0.25 MG tablet Take 0.25-0.5 mg by mouth at bedtime. Takes depending on bad restless legs are.   Yes Historical Provider, MD  sevelamer carbonate (RENVELA) 800 MG tablet Take 2,400-4,000 mg by mouth 3 (three) times daily with meals. To take 5 with meals and 2 with snacks   Yes Historical Provider, MD  simvastatin (ZOCOR) 40 MG tablet Take 1 tablet (40 mg total) by mouth  every Monday, Wednesday, and Friday. 08/23/14  Yes Rhonda G Barrett, PA-C  feeding supplement, RESOURCE BREEZE, (RESOURCE BREEZE) LIQD Take 1 Container by mouth 3 (three) times daily between meals. Patient not taking: Reported on 10/08/2014 09/30/14   Annita Brod, MD   BP 190/108 mmHg  Pulse 117  Temp(Src) 98.2 F (36.8 C) (Oral)  Resp 34  Ht 5\' 9"  (1.753 m)  Wt 156 lb (70.761 kg)  BMI 23.03 kg/m2  Physical Exam  Constitutional: He is oriented to person, place, and time. He appears well-developed and well-nourished.  HENT:  Head: Normocephalic and atraumatic.  Right Ear: External ear normal.  Left Ear: External ear normal.  Eyes: Conjunctivae and EOM are normal. Pupils are equal, round, and reactive to light.  Neck: Normal range of motion and phonation normal. Neck supple.  Cardiovascular: Regular rhythm and normal heart sounds.  Tachycardia present.   Jugular venus extension at 45 degrees.  Pulmonary/Chest: Effort normal. Tachypnea noted. He has wheezes. He exhibits no bony tenderness.  Abdominal: Soft. There is no tenderness.  Dialysis catheter to LLQ. The site appears normal.   Musculoskeletal: Normal range of motion.  Neurological: He is alert and oriented to person, place, and time. No cranial nerve deficit or sensory deficit. He exhibits normal muscle tone. Coordination normal.  Skin: Skin is warm, dry and intact.  Psychiatric: He has a normal mood and affect. His behavior is normal. Judgment and thought content normal.  Nursing note and vitals reviewed.   ED Course  Procedures (including critical care time)   COORDINATION OF CARE: 3:08 PM- Pt advised of plan for treatment and pt agrees.  Labs Review Labs Reviewed  CBC WITH DIFFERENTIAL/PLATELET - Abnormal; Notable for the following:    RBC 2.95 (*)    Hemoglobin 8.4 (*)    HCT 27.3 (*)    RDW 20.0 (*)    Neutrophils Relative % 81 (*)    Neutro Abs 7.9 (*)    Lymphocytes Relative 10 (*)    All other  components within normal limits  HEPATIC FUNCTION PANEL - Abnormal; Notable for the following:    Albumin 2.8 (*)    All other components within normal limits  I-STAT CHEM 8, ED - Abnormal; Notable for the following:    BUN 63 (*)    Creatinine, Ser 14.40 (*)    Calcium, Ion 1.09 (*)    Hemoglobin 9.2 (*)    HCT 27.0 (*)    All other components within normal limits  I-STAT TROPOININ, ED - Abnormal; Notable for the following:    Troponin i, poc 0.14 (*)    All other components within normal limits  I-STAT TROPOININ, ED - Abnormal; Notable for the following:    Troponin i, poc 0.16 (*)    All other components within normal limits  SAMPLE TO BLOOD BANK    Evaluation at D/C- he feels better. Lungs with improved air mvt. Case was discussed with his Nephrologist who advises "use green and yellow bag tonight for dialysis".   Imaging Review Dg Chest Port 1 View  10/20/2014   CLINICAL DATA:  Shortness of breath and productive cough  EXAM: PORTABLE CHEST - 1 VIEW  COMPARISON:  September 22, 2014  FINDINGS: There is persistent airspace consolidation in the left lower lobe with small left effusion. Lungs are otherwise clear. Heart is mildly enlarged with pulmonary vascularity within normal limits. No adenopathy. There is atherosclerotic change in the aorta.  IMPRESSION: Persistent left lower lobe consolidation with left effusion. Lungs elsewhere clear. Heart prominent but stable.   Electronically Signed   By: Lowella Grip M.D.   On: 10/20/2014 15:34     EKG Interpretation   Date/Time:  Thursday October 20 2014 14:57:26 EST Ventricular Rate:  110 PR Interval:  162 QRS Duration: 100 QT Interval:  359 QTC Calculation: 486 R Axis:   64 Text Interpretation:  Sinus or ectopic atrial tachycardia Multiform  ventricular premature complexes Probable LVH with secondary repol abnrm  Anterior Q waves, possibly due to LVH Since last tracing rate faster and  new PVCs Confirmed by [facility]  MD, Jonmarc Bodkin  ([id number]) on 10/20/2014 5:24:13 PM  MDM   Final diagnoses:  Bronchitis  End stage renal disease    Eval. C/w bronchitis with bronchospasm. Suspect mild fluid overload. No metabolic instability. Doubt SBI.  Nursing Notes Reviewed/ Care Coordinated Applicable Imaging Reviewed Interpretation of Laboratory Data incorporated into ED treatment  The patient appears reasonably screened and/or stabilized for discharge and I doubt any other medical condition or other [facility] requiring further screening, evaluation, or treatment in the ED at this time prior to discharge.  Plan: Home Medications- usual; Home Treatments- rest; return here if the recommended treatment, does not improve the symptoms; Recommended follow up- PCP and Nephrology prn  I personally performed the services described in this documentation, which was scribed in my presence. The recorded information has been reviewed and is accurate.     Richarda Blade, MD 10/22/14 [phone]

## 2014-10-20 NOTE — ED Notes (Signed)
Respiratory paged for breathing tx. 

## 2014-10-20 NOTE — ED Notes (Signed)
MD at bedside. 

## 2014-10-20 NOTE — ED Notes (Signed)
Lab at bedside at this time.  

## 2014-10-21 ENCOUNTER — Other Ambulatory Visit (HOSPITAL_COMMUNITY): Payer: Self-pay | Admitting: Podiatry

## 2014-10-21 DIAGNOSIS — I998 Other disorder of circulatory system: Secondary | ICD-10-CM

## 2014-10-26 ENCOUNTER — Ambulatory Visit (HOSPITAL_COMMUNITY): Admission: RE | Admit: 2014-10-26 | Payer: Medicare Other | Source: Ambulatory Visit

## 2014-12-14 ENCOUNTER — Other Ambulatory Visit (HOSPITAL_COMMUNITY): Payer: Self-pay | Admitting: Family Medicine

## 2014-12-14 ENCOUNTER — Ambulatory Visit (HOSPITAL_COMMUNITY)
Admission: RE | Admit: 2014-12-14 | Discharge: 2014-12-14 | Disposition: A | Discharge status: Home / Self Care | Payer: Medicare Other | Source: Ambulatory Visit | Attending: Family Medicine | Admitting: Family Medicine

## 2014-12-14 DIAGNOSIS — M479 Spondylosis, unspecified: Secondary | ICD-10-CM | POA: Insufficient documentation

## 2014-12-14 DIAGNOSIS — M542 Cervicalgia: Secondary | ICD-10-CM

## 2014-12-14 DIAGNOSIS — M5032 Other cervical disc degeneration, mid-cervical region: Secondary | ICD-10-CM | POA: Diagnosis not present

## 2014-12-26 ENCOUNTER — Ambulatory Visit (HOSPITAL_COMMUNITY)
Admission: RE | Admit: 2014-12-26 | Discharge: 2014-12-26 | Disposition: A | Discharge status: Home / Self Care | Payer: Medicare Other | Source: Ambulatory Visit | Attending: Family Medicine | Admitting: Family Medicine

## 2014-12-26 ENCOUNTER — Ambulatory Visit: Payer: Medicare Other | Admitting: Orthopedic Surgery

## 2014-12-26 ENCOUNTER — Other Ambulatory Visit (HOSPITAL_COMMUNITY): Payer: Self-pay | Admitting: Family Medicine

## 2014-12-26 DIAGNOSIS — M79671 Pain in right foot: Secondary | ICD-10-CM

## 2014-12-26 DIAGNOSIS — W06XXXA Fall from bed, initial encounter: Secondary | ICD-10-CM | POA: Diagnosis not present

## 2014-12-26 DIAGNOSIS — M7989 Other specified soft tissue disorders: Secondary | ICD-10-CM | POA: Diagnosis not present

## 2014-12-27 ENCOUNTER — Emergency Department (HOSPITAL_COMMUNITY): Payer: Medicare Other

## 2014-12-27 ENCOUNTER — Encounter (HOSPITAL_COMMUNITY): Payer: Self-pay | Admitting: Emergency Medicine

## 2014-12-27 ENCOUNTER — Emergency Department (HOSPITAL_COMMUNITY)
Admission: EM | Admit: 2014-12-27 | Discharge: 2014-12-27 | Disposition: A | Discharge status: Home / Self Care | Payer: Medicare Other | Source: Home / Self Care | Attending: Emergency Medicine | Admitting: Emergency Medicine

## 2014-12-27 DIAGNOSIS — R011 Cardiac murmur, unspecified: Secondary | ICD-10-CM | POA: Diagnosis present

## 2014-12-27 DIAGNOSIS — M109 Gout, unspecified: Secondary | ICD-10-CM | POA: Diagnosis present

## 2014-12-27 DIAGNOSIS — Z792 Long term (current) use of antibiotics: Secondary | ICD-10-CM | POA: Insufficient documentation

## 2014-12-27 DIAGNOSIS — I252 Old myocardial infarction: Secondary | ICD-10-CM

## 2014-12-27 DIAGNOSIS — I5023 Acute on chronic systolic (congestive) heart failure: Secondary | ICD-10-CM | POA: Diagnosis not present

## 2014-12-27 DIAGNOSIS — Z7982 Long term (current) use of aspirin: Secondary | ICD-10-CM

## 2014-12-27 DIAGNOSIS — I7092 Chronic total occlusion of artery of the extremities: Secondary | ICD-10-CM | POA: Diagnosis present

## 2014-12-27 DIAGNOSIS — I132 Hypertensive heart and chronic kidney disease with heart failure and with stage 5 chronic kidney disease, or end stage renal disease: Secondary | ICD-10-CM | POA: Insufficient documentation

## 2014-12-27 DIAGNOSIS — L97519 Non-pressure chronic ulcer of other part of right foot with unspecified severity: Secondary | ICD-10-CM | POA: Diagnosis present

## 2014-12-27 DIAGNOSIS — Z862 Personal history of diseases of the blood and blood-forming organs and certain disorders involving the immune mechanism: Secondary | ICD-10-CM | POA: Insufficient documentation

## 2014-12-27 DIAGNOSIS — I70261 Atherosclerosis of native arteries of extremities with gangrene, right leg: Principal | ICD-10-CM | POA: Diagnosis present

## 2014-12-27 DIAGNOSIS — Z9981 Dependence on supplemental oxygen: Secondary | ICD-10-CM

## 2014-12-27 DIAGNOSIS — Z992 Dependence on renal dialysis: Secondary | ICD-10-CM

## 2014-12-27 DIAGNOSIS — D638 Anemia in other chronic diseases classified elsewhere: Secondary | ICD-10-CM | POA: Diagnosis present

## 2014-12-27 DIAGNOSIS — E780 Pure hypercholesterolemia: Secondary | ICD-10-CM | POA: Diagnosis present

## 2014-12-27 DIAGNOSIS — Z87891 Personal history of nicotine dependence: Secondary | ICD-10-CM | POA: Insufficient documentation

## 2014-12-27 DIAGNOSIS — R0989 Other specified symptoms and signs involving the circulatory and respiratory systems: Secondary | ICD-10-CM

## 2014-12-27 DIAGNOSIS — N2889 Other specified disorders of kidney and ureter: Secondary | ICD-10-CM | POA: Diagnosis present

## 2014-12-27 DIAGNOSIS — N2581 Secondary hyperparathyroidism of renal origin: Secondary | ICD-10-CM | POA: Diagnosis present

## 2014-12-27 DIAGNOSIS — L03115 Cellulitis of right lower limb: Secondary | ICD-10-CM | POA: Diagnosis present

## 2014-12-27 DIAGNOSIS — J189 Pneumonia, unspecified organism: Secondary | ICD-10-CM | POA: Diagnosis not present

## 2014-12-27 DIAGNOSIS — N186 End stage renal disease: Secondary | ICD-10-CM

## 2014-12-27 DIAGNOSIS — L03119 Cellulitis of unspecified part of limb: Secondary | ICD-10-CM

## 2014-12-27 DIAGNOSIS — J441 Chronic obstructive pulmonary disease with (acute) exacerbation: Secondary | ICD-10-CM | POA: Diagnosis not present

## 2014-12-27 DIAGNOSIS — J9600 Acute respiratory failure, unspecified whether with hypoxia or hypercapnia: Secondary | ICD-10-CM | POA: Diagnosis not present

## 2014-12-27 DIAGNOSIS — K219 Gastro-esophageal reflux disease without esophagitis: Secondary | ICD-10-CM | POA: Diagnosis present

## 2014-12-27 DIAGNOSIS — I739 Peripheral vascular disease, unspecified: Secondary | ICD-10-CM

## 2014-12-27 DIAGNOSIS — Y95 Nosocomial condition: Secondary | ICD-10-CM | POA: Diagnosis not present

## 2014-12-27 DIAGNOSIS — I2510 Atherosclerotic heart disease of native coronary artery without angina pectoris: Secondary | ICD-10-CM | POA: Diagnosis present

## 2014-12-27 DIAGNOSIS — I509 Heart failure, unspecified: Secondary | ICD-10-CM

## 2014-12-27 DIAGNOSIS — D62 Acute posthemorrhagic anemia: Secondary | ICD-10-CM | POA: Diagnosis not present

## 2014-12-27 DIAGNOSIS — Q613 Polycystic kidney, unspecified: Secondary | ICD-10-CM

## 2014-12-27 DIAGNOSIS — M79671 Pain in right foot: Secondary | ICD-10-CM | POA: Diagnosis not present

## 2014-12-27 DIAGNOSIS — I429 Cardiomyopathy, unspecified: Secondary | ICD-10-CM | POA: Diagnosis present

## 2014-12-27 DIAGNOSIS — E875 Hyperkalemia: Secondary | ICD-10-CM | POA: Diagnosis present

## 2014-12-27 LAB — BASIC METABOLIC PANEL
Anion gap: 12 (ref 5–15)
BUN: 25 mg/dL — AB (ref 6–23)
CO2: 26 mmol/L (ref 19–32)
CREATININE: 5.52 mg/dL — AB (ref 0.50–1.35)
Calcium: 8.9 mg/dL (ref 8.4–10.5)
Chloride: 99 mmol/L (ref 96–112)
GFR calc Af Amer: 10 mL/min — ABNORMAL LOW (ref >90)
GFR calc non Af Amer: 9 mL/min — ABNORMAL LOW (ref >90)
Glucose, Bld: 90 mg/dL (ref 70–99)
Potassium: 4.5 mmol/L (ref 3.5–5.1)
SODIUM: 137 mmol/L (ref 135–145)

## 2014-12-27 LAB — CBC WITH DIFFERENTIAL/PLATELET
BASOS ABS: 0 10*3/uL (ref 0.0–0.1)
BASOS PCT: 0 % (ref 0–1)
EOS ABS: 0.1 10*3/uL (ref 0.0–0.7)
EOS PCT: 1 % (ref 0–5)
HEMATOCRIT: 30.7 % — AB (ref 39.0–52.0)
Hemoglobin: 9.6 g/dL — ABNORMAL LOW (ref 13.0–17.0)
Lymphocytes Relative: 5 % — ABNORMAL LOW (ref 12–46)
Lymphs Abs: 0.5 10*3/uL — ABNORMAL LOW (ref 0.7–4.0)
MCH: 31.1 pg (ref 26.0–34.0)
MCHC: 31.3 g/dL (ref 30.0–36.0)
MCV: 99.4 fL (ref 78.0–100.0)
MONO ABS: 0.9 10*3/uL (ref 0.1–1.0)
Monocytes Relative: 8 % (ref 3–12)
Neutro Abs: 8.9 10*3/uL — ABNORMAL HIGH (ref 1.7–7.7)
Neutrophils Relative %: 86 % — ABNORMAL HIGH (ref 43–77)
PLATELETS: 309 10*3/uL (ref 150–400)
RBC: 3.09 MIL/uL — ABNORMAL LOW (ref 4.22–5.81)
RDW: 19.1 % — ABNORMAL HIGH (ref 11.5–15.5)
WBC: 10.4 10*3/uL (ref 4.0–10.5)

## 2014-12-27 MED ORDER — OXYCODONE-ACETAMINOPHEN 5-325 MG PO TABS: 1.0000 | ORAL_TABLET | Freq: Four times a day (QID) | ORAL | Status: DC | PRN

## 2014-12-27 MED ORDER — OXYCODONE-ACETAMINOPHEN 5-325 MG PO TABS
1.0000 | ORAL_TABLET | Freq: Once | ORAL | Status: AC
  Administered 2014-12-27: 1 via ORAL
  Filled 2014-12-27: qty 1

## 2014-12-27 NOTE — ED Notes (Addendum)
Patient states he fell about a month ago and hurt "all the toes but one on the right foot." Complaining of discoloration and pain in right foot. States he was seen by PCP yesterday and sent here for outpatient xray but has not gotten the results yet. Laceration noted to right second toe with swelling to right foot.

## 2014-12-27 NOTE — ED Provider Notes (Signed)
CSN: [id number]     Arrival date & time 12/27/14  1429 History   First MD Initiated Contact with Patient 12/27/14 1456     Chief Complaint  Patient presents with  . Foot Injury     (Consider location/radiation/quality/duration/timing/severity/associated sxs/prior Treatment) HPI  This is an 81 year old male who presents with right foot pain. History of peripheral vascular disease and end-stage renal disease. Patient reports that approximately 3-4 weeks ago he fell getting out of bed.  He did not seek medical attention at that time. He noticed a laceration to his second toe. Since that time he has had progressive pain. He saw his primary physician yesterday who gave him a prescription for Keflex with concerns for cellulitis. He also ordered an outpatient x-ray which is negative for acute fracture. Patient reports pain is 10 out of 10. It does not get any better with pain medication. It is worse with ambulation. Denies any fevers.  Past Medical History  Diagnosis Date  . Hypertensive heart disease   . Hypercholesterolemia   . Gout   . Secondary cardiomyopathy     LVEF 40-45%  . Anemia of chronic disease   . Hypertension   . Arthritis   . Peripheral vascular disease   . ESRD on peritoneal dialysis   . On home oxygen therapy     "use what I need to" (08/17/2014)  . Pneumonia 09/2012  . Pericarditis 08/17/2014  . NSTEMI (non-ST elevated myocardial infarction) 08/17/2014   Past Surgical History  Procedure Laterality Date  . Knee arthroscopy Right 2007  . Back surgery    . Hemorrhoid surgery  1970's  . Ganglion cyst excision  01/03/2012    Procedure: REMOVAL GANGLION OF WRIST;  Surgeon: Scherry Ran, MD;  Location: [facility];  Service: General;  Laterality: Right;  . Colonoscopy    . Av fistula placement  08/24/2012    Procedure: ARTERIOVENOUS (AV) FISTULA CREATION;  Surgeon: Rosetta Posner, MD;  Location: [place];  Service: Vascular;  Laterality: Left;  . Insertion of dialysis catheter  Right      neck  . Insertion of dialysis catheter  10/19/2012    Procedure: INSERTION OF DIALYSIS CATHETER;  Surgeon: Rosetta Posner, MD;  Location: [place];  Service: Vascular;  Laterality: N/A;  REMOVE TEMPORARY CATH  . Cardiac catheterization  08/17/2014  . Cholecystectomy    . Lumbar disc surgery  2004  . Left heart catheterization with coronary angiogram N/A 08/17/2014    Procedure: LEFT HEART CATHETERIZATION WITH CORONARY ANGIOGRAM;  Surgeon: Larey Dresser, MD;  Location: [facility] CATH LAB;  Service: Cardiovascular;  Laterality: N/A;  . Colonoscopy Left 09/26/2014    Procedure: COLONOSCOPY;  Surgeon: Arta Silence, MD;  Location: [facility] ENDOSCOPY;  Service: Endoscopy;  Laterality: Left;   Family History  Problem Relation Age of Onset  . Arthritis    . Cancer    . Kidney disease    . Anesthesia problems Neg Hx   . Hypotension Neg Hx   . Malignant hyperthermia Neg Hx   . Pseudochol deficiency Neg Hx   . Cancer Sister   . Cancer Brother     colon  . Cancer Brother     colon   History  Substance Use Topics  . Smoking status: Former Smoker -- 1.00 packs/day for 30 years    Types: Cigarettes    Quit date: 08/19/1998  . Smokeless tobacco: Former Systems developer    Types: Chew    Quit date: 12/31/1993  .  Alcohol Use: No    Review of Systems  Constitutional: Negative.  Negative for fever.  Respiratory: Negative.  Negative for chest tightness and shortness of breath.   Cardiovascular: Negative.  Negative for chest pain.  Gastrointestinal: Negative.  Negative for abdominal pain.  Genitourinary: Negative.  Negative for dysuria.  Musculoskeletal: Negative for back pain.  Skin: Positive for color change and wound.  Neurological: Negative for headaches.  All other systems reviewed and are negative.     Allergies  Review of patient's allergies indicates no known allergies.  Home Medications   Prior to Admission medications   Medication Sig Start Date End Date Taking? Authorizing Provider   amLODipine (NORVASC) 10 MG tablet Take 1 tablet (10 mg total) by mouth daily. 06/23/14  Yes Lucia Gaskins, MD  aspirin 325 MG tablet Take 1 tablet (325 mg total) by mouth 2 (two) times daily. Patient taking differently: Take 325 mg by mouth daily.  08/23/14  Yes Rhonda G Barrett, PA-C  calcitRIOL (ROCALTROL) 0.5 MCG capsule Take 1 capsule (0.5 mcg total) by mouth daily. 06/23/14  Yes Lucia Gaskins, MD  carvedilol (COREG) 25 MG tablet Take 1 tablet (25 mg total) by mouth 2 (two) times daily with a meal. 09/02/14  Yes Lucia Gaskins, MD  cephALEXin (KEFLEX) 250 MG capsule Take 1 capsule by mouth 4 (four) times daily. Starting 12/26/2014 x 7 days. 12/26/14  Yes Historical Provider, MD  cloNIDine (CATAPRES) 0.2 MG tablet Take 1 tablet by mouth 2 (two) times daily. 12/27/14  Yes Historical Provider, MD  COLCRYS 0.6 MG tablet Take 1 tablet by mouth daily as needed (gout flare up).  10/31/14  Yes Historical Provider, MD  feeding supplement, RESOURCE BREEZE, (RESOURCE BREEZE) LIQD Take 1 Container by mouth 3 (three) times daily between meals. 09/30/14  Yes Annita Brod, MD  hydrALAZINE (APRESOLINE) 50 MG tablet Take 1 tablet by mouth 3 (three) times daily. 10/08/14  Yes Historical Provider, MD  labetalol (NORMODYNE) 200 MG tablet Take 2 tablets (400 mg total) by mouth 2 (two) times daily. Patient taking differently: Take 200 mg by mouth at bedtime.  07/17/12  Yes Satira Sark, MD  lisinopril (PRINIVIL,ZESTRIL) 10 MG tablet Take 1 tablet (10 mg total) by mouth daily. 09/02/14  Yes Lucia Gaskins, MD  multivitamin (RENA-VIT) TABS tablet Take 1 tablet by mouth daily.   Yes Historical Provider, MD  pantoprazole (PROTONIX) 40 MG tablet Take 1 tablet (40 mg total) by mouth daily at 12 noon. Patient taking differently: Take 40 mg by mouth daily as needed (acid reflux).  08/23/14  Yes Rhonda G Barrett, PA-C  potassium chloride (K-DUR,KLOR-CON) 10 MEQ tablet Take 1 tablet by mouth daily. 12/13/14  Yes  Historical Provider, MD  predniSONE (STERAPRED UNI-PAK) 10 MG tablet Take 1-6 tablets by mouth See admin instructions. Take 6 tabs x 2 days, 5 tabs x 2 days, 4 tabs x 2 days, 3 tabs x 2 days, 2 tabs x 2 days and then 1 tab x 2 days. 12/23/14  Yes Historical Provider, MD  rOPINIRole (REQUIP) 0.25 MG tablet Take 0.25-0.5 mg by mouth at bedtime. Takes depending on bad restless legs are.   Yes Historical Provider, MD  sevelamer carbonate (RENVELA) 800 MG tablet Take 2,400-4,000 mg by mouth 3 (three) times daily with meals. To take 5 with meals and 2 with snacks   Yes Historical Provider, MD  simvastatin (ZOCOR) 80 MG tablet Take 1 tablet by mouth daily. 12/06/14  Yes Historical Provider, MD  cloNIDine (  CATAPRES) 0.1 MG tablet Take 1 tablet (0.1 mg total) by mouth 2 (two) times daily. Patient not taking: Reported on 12/27/2014 08/23/14   Evelene Croon Barrett, PA-C  docusate sodium 100 MG CAPS Take 100 mg by mouth 2 (two) times daily. Patient not taking: Reported on 12/27/2014 08/23/14   Evelene Croon Barrett, PA-C  hydrALAZINE (APRESOLINE) 25 MG tablet Take 1 tablet (25 mg total) by mouth 3 (three) times daily. Patient not taking: Reported on 12/27/2014 08/23/14   Evelene Croon Barrett, PA-C  oxyCODONE-acetaminophen (PERCOCET/ROXICET) 5-325 MG per tablet Take 1 tablet by mouth every 6 (six) hours as needed for severe pain. 12/27/14   Merryl Hacker, MD  polyethylene glycol (MIRALAX / GLYCOLAX) packet Take 17 g by mouth daily. Patient not taking: Reported on 12/27/2014 08/23/14   Evelene Croon Barrett, PA-C  simvastatin (ZOCOR) 40 MG tablet Take 1 tablet (40 mg total) by mouth every Monday, Wednesday, and Friday. Patient not taking: Reported on 12/27/2014 08/23/14   Evelene Croon Barrett, PA-C   BP 163/89 mmHg  Pulse 95  Temp(Src) 98.7 F (37.1 C) (Oral)  Resp 20  Ht 5\' 9"  (1.753 m)  Wt 165 lb (74.844 kg)  BMI 24.36 kg/m2  SpO2 96% Physical Exam  Constitutional: He is oriented to person, place, and time. No distress.  HENT:  Head:  Normocephalic and atraumatic.  Cardiovascular: Normal rate and regular rhythm.   Murmur heard. Pulmonary/Chest: Effort normal and breath sounds normal. No respiratory distress. He has no wheezes.  Abdominal: Soft. Bowel sounds are normal. There is no tenderness. There is no rebound.  Musculoskeletal: He exhibits edema.  1+ bilateral lower extremity edema which appear symmetric, mild discoloration and erythema to the dorsum of the right foot, healing laceration/avulsion of skin tissue off the dorsum of the second toe. Unable to palpate or doppler DP or posterior tibial pulses. When compared to the left foot, right toes are cooler to touch without sensory changes.  Abrasions also noted to the dorsum of the third and fourth digits  Neurological: He is alert and oriented to person, place, and time.  Skin: Skin is warm and dry.  Psychiatric: He has a normal mood and affect.  Nursing note and vitals reviewed.   ED Course  Procedures (including critical care time) Labs Review Labs Reviewed  CBC WITH DIFFERENTIAL/PLATELET - Abnormal; Notable for the following:    RBC 3.09 (*)    Hemoglobin 9.6 (*)    HCT 30.7 (*)    RDW 19.1 (*)    Neutrophils Relative % 86 (*)    Neutro Abs 8.9 (*)    Lymphocytes Relative 5 (*)    Lymphs Abs 0.5 (*)    All other components within normal limits  BASIC METABOLIC PANEL - Abnormal; Notable for the following:    BUN 25 (*)    Creatinine, Ser 5.52 (*)    GFR calc non Af Amer 9 (*)    GFR calc Af Amer 10 (*)    All other components within normal limits    Imaging Review US Arterial Seg Single  12/27/2014   CLINICAL DATA:  Right greater than left lower extremity pain concern for rest pain. Cool extremities. Open wounds of the second, third and fourth toes. End-stage renal disease.  EXAM: NONINVASIVE PHYSIOLOGIC VASCULAR STUDY OF BILATERAL LOWER EXTREMITIES  TECHNIQUE: Evaluation of both lower extremities were performed at rest, including calculation of  ankle-brachial indices with single level Doppler, pressure and pulse volume recording.  COMPARISON:  None.  FINDINGS: Segmental pressures could not be obtained at the ankles on either side. Additionally, no Doppler signal could be detected in the ankle regions. Therefore ABIs cannot be obtained.  Right ABI:  Not obtainable  Left ABI:  Not obtainable  IMPRESSION: Evidence of significant peripheral small vessel tibial disease. ABIs cannot be obtained.   Electronically Signed   By: Jerilynn Mages.  Shick M.D.   On: 12/27/2014 16:48   Dg Foot Complete Right  12/26/2014   CLINICAL DATA:  Chronic right foot pain progressive over the past 2 weeks following a fall from bed no definite acute injury  EXAM: RIGHT FOOT COMPLETE - 3+ VIEW  COMPARISON:  Right ankle dated July 10, 2012  FINDINGS: The bones are mildly osteopenic. The interphalangeal joints exhibit diffuse narrowing. No erosive changes are demonstrated. There is mild narrowing of the first metatarsophalangeal joint. The second through fifth metatarsophalangeal joints are unremarkable. The tarsometatarsal and intertarsal joints are normal. There is no acute fracture or dislocation. There are vascular calcifications, and there is mild soft tissue swelling over the midfoot.  IMPRESSION: There is no acute or healing fracture. There is mild osteoarthritic change of the interphalangeal joints. There is mild midfoot soft tissue swelling.   Electronically Signed   By: David  Martinique   On: 12/26/2014 12:33     EKG Interpretation None      MDM   Final diagnoses:  Cellulitis of foot excluding toe  PVD (peripheral vascular disease)    Patient presents with pain of the right foot. Was seen by his primary physician yesterday who ordered an x-ray and place the patient on Keflex for possible cellulitis. Evidence of old injury including laceration to the second toe. There is mild erythema without induration to the dorsum of the foot. Foot is cool to touch than on the left and  unable to Doppler pulses on either foot. Patient has a documented history of peripheral vascular disease but I am unable to find any imaging. ABIs attempted an unattainable in the bilateral feet secondary to extensive peripheral vascular disease. Suspect that this is a chronic finding. No evidence of gangrene or acute blockage. Patient's pain may be secondary to infection versus claudication. Discuss with patient continuing antibiotics. He was just given these yesterday so at this time I do not feel he has failed outpatient treatment. Basic labwork is reassuring.  Patient to follow-up with vascular surgery for peripheral vascular disease as well as his primary physician in one to 2 days for recheck.  After history, exam, and medical workup I feel the patient has been appropriately medically screened and is safe for discharge home. Pertinent diagnoses were discussed with the patient. Patient was given return precautions.     Merryl Hacker, MD 12/27/14 [phone]

## 2014-12-27 NOTE — ED Notes (Signed)
Verbal order from Dr. Dina Rich to change [place] to ABI.

## 2014-12-27 NOTE — Discharge Instructions (Signed)
Cellulitis °Cellulitis is an infection of the skin and the tissue beneath it. The infected area is usually red and tender. Cellulitis occurs most often in the arms and lower legs.  °CAUSES  °Cellulitis is caused by bacteria that enter the skin through cracks or cuts in the skin. The most common types of bacteria that cause cellulitis are staphylococci and streptococci. °SIGNS AND SYMPTOMS  °· Redness and warmth. °· Swelling. °· Tenderness or pain. °· Fever. °DIAGNOSIS  °Your health care provider can usually determine what is wrong based on a physical exam. Blood tests may also be done. °TREATMENT  °Treatment usually involves taking an antibiotic medicine. °HOME CARE INSTRUCTIONS  °· Take your antibiotic medicine as directed by your health care provider. Finish the antibiotic even if you start to feel better. °· Keep the infected arm or leg elevated to reduce swelling. °· Apply a warm cloth to the affected area up to 4 times per day to relieve pain. °· Take medicines only as directed by your health care provider. °· Keep all follow-up visits as directed by your health care provider. °SEEK MEDICAL CARE IF:  °· You notice red streaks coming from the infected area. °· Your red area gets larger or turns dark in color. °· Your bone or joint underneath the infected area becomes painful after the skin has healed. °· Your infection returns in the same area or another area. °· You notice a swollen bump in the infected area. °· You develop new symptoms. °· You have a fever. °SEEK IMMEDIATE MEDICAL CARE IF:  °· You feel very sleepy. °· You develop vomiting or diarrhea. °· You have a general ill feeling (malaise) with muscle aches and pains. °MAKE SURE YOU:  °· Understand these instructions. °· Will watch your condition. °· Will get help right away if you are not doing well or get worse. °Document Released: 06/19/2005 Document Revised: 01/24/2014 Document Reviewed: 11/25/2011 °ExitCare® Patient Information ©2015 [employer].  This information is not intended to replace advice given to you by your health care provider. Make sure you discuss any questions you have with your health care provider. ° °Peripheral Vascular Disease °Peripheral Vascular Disease (PVD), also called Peripheral Arterial Disease (PAD), is a circulation problem caused by cholesterol (atherosclerotic plaque) deposits in the arteries. PVD commonly occurs in the lower extremities (legs) but it can occur in other areas of the body, such as your arms. The cholesterol buildup in the arteries reduces blood flow which can cause pain and other serious problems. The presence of PVD can place a person at risk for Coronary Artery Disease (CAD).  °CAUSES  °Causes of PVD can be many. It is usually associated with more than one risk factor such as:  °· High Cholesterol. °· Smoking. °· Diabetes. °· Lack of exercise or inactivity. °· High blood pressure (hypertension). °· Obesity. °· Family history. °SYMPTOMS  °· When the lower extremities are affected, patients with PVD may experience: °¨ Leg pain with exertion or physical activity. This is called INTERMITTENT CLAUDICATION. This may present as cramping or numbness with physical activity. The location of the pain is associated with the level of blockage. For example, blockage at the abdominal level (distal abdominal aorta) may result in buttock or hip pain. Lower leg arterial blockage may result in calf pain. °¨ As PVD becomes more severe, pain can develop with less physical activity. °¨ In people with severe PVD, leg pain may occur at rest. °· Other PVD signs and symptoms: °¨ Leg numbness   or weakness. °¨ Coldness in the affected leg or foot, especially when compared to the other leg. °¨ A change in leg color. °¨ Patients with significant PVD are more prone to ulcers or sores on toes, feet or legs. These may take longer to heal or may reoccur. The ulcers or sores can become infected. °¨ If signs and symptoms of PVD are ignored,  gangrene may occur. This can result in the loss of toes or loss of an entire limb. °· Not all leg pain is related to PVD. Other medical conditions can cause leg pain such as: °¨ Blood clots (embolism) or Deep Vein Thrombosis. °¨ Inflammation of the blood vessels (vasculitis). °¨ Spinal stenosis. °DIAGNOSIS  °Diagnosis of PVD can involve several different types of tests. These can include: °· Pulse Volume Recording Method (PVR). This test is simple, painless and does not involve the use of X-rays. PVR involves measuring and comparing the blood pressure in the arms and legs. An ABI (Ankle-Brachial Index) is calculated. The normal ratio of blood pressures is 1. As this number becomes smaller, it indicates more severe disease. °¨ < 0.95 - indicates significant narrowing in one or more leg vessels. °¨ <0.8 - there will usually be pain in the foot, leg or buttock with exercise. °¨ <0.4 - will usually have pain in the legs at rest. °¨ <0.25 - usually indicates limb threatening PVD. °· Doppler detection of pulses in the legs. This test is painless and checks to see if you have a pulses in your legs/feet. °· A dye or contrast material (a substance that highlights the blood vessels so they show up on x-ray) may be given to help your caregiver better see the arteries for the following tests. The dye is eliminated from your body by the kidney's. Your caregiver may order blood work to check your kidney function and other laboratory values before the following tests are performed: °¨ Magnetic Resonance Angiography (MRA). An MRA is a picture study of the blood vessels and arteries. The MRA machine uses a large magnet to produce images of the blood vessels. °¨ Computed Tomography Angiography (CTA). A CTA is a specialized x-ray that looks at how the blood flows in your blood vessels. An IV may be inserted into your arm so contrast dye can be injected. °¨ Angiogram. Is a procedure that uses x-rays to look at your blood vessels. This  procedure is minimally invasive, meaning a small incision (cut) is made in your groin. A small tube (catheter) is then inserted into the artery of your groin. The catheter is guided to the blood vessel or artery your caregiver wants to examine. Contrast dye is injected into the catheter. X-rays are then taken of the blood vessel or artery. After the images are obtained, the catheter is taken out. °TREATMENT  °Treatment of PVD involves many interventions which may include: °· Lifestyle changes: °¨ Quitting smoking. °¨ Exercise. °¨ Following a low fat, low cholesterol diet. °¨ Control of diabetes. °¨ Foot care is very important to the PVD patient. Good foot care can help prevent infection. °· Medication: °¨ Cholesterol-lowering medicine. °¨ Blood pressure medicine. °¨ Anti-platelet drugs. °¨ Certain medicines may reduce symptoms of Intermittent Claudication. °· Interventional/Surgical options: °¨ Angioplasty. An Angioplasty is a procedure that inflates a balloon in the blocked artery. This opens the blocked artery to improve blood flow. °¨ Stent Implant. A wire mesh tube (stent) is placed in the artery. The stent expands and stays in place, allowing the artery to   remain open. °¨ Peripheral Bypass Surgery. This is a surgical procedure that reroutes the blood around a blocked artery to help improve blood flow. This type of procedure may be performed if Angioplasty or stent implants are not an option. °SEEK IMMEDIATE MEDICAL CARE IF:  °· You develop pain or numbness in your arms or legs. °· Your arm or leg turns cold, becomes blue in color. °· You develop redness, warmth, swelling and pain in your arms or legs. °MAKE SURE YOU:  °· Understand these instructions. °· Will watch your condition. °· Will get help right away if you are not doing well or get worse. °Document Released: 10/17/2004 Document Revised: 12/02/2011 Document Reviewed: 09/13/2008 °ExitCare® Patient Information ©2015 [employer]. This information is not  intended to replace advice given to you by your health care provider. Make sure you discuss any questions you have with your health care provider. ° °

## 2014-12-29 ENCOUNTER — Other Ambulatory Visit: Payer: Self-pay | Admitting: *Deleted

## 2014-12-29 ENCOUNTER — Encounter: Payer: Self-pay | Admitting: Vascular Surgery

## 2014-12-29 DIAGNOSIS — I739 Peripheral vascular disease, unspecified: Secondary | ICD-10-CM

## 2014-12-30 ENCOUNTER — Encounter (HOSPITAL_COMMUNITY): Payer: Self-pay | Admitting: *Deleted

## 2014-12-30 ENCOUNTER — Emergency Department (HOSPITAL_COMMUNITY): Payer: Medicare Other

## 2014-12-30 ENCOUNTER — Inpatient Hospital Stay (HOSPITAL_COMMUNITY)
Admission: EM | Admit: 2014-12-30 | Discharge: 2015-01-13 | DRG: 239 | Disposition: A | Discharge status: Home Health | Payer: Medicare Other | Attending: Internal Medicine | Admitting: Internal Medicine

## 2014-12-30 ENCOUNTER — Encounter: Payer: Medicare Other | Admitting: Vascular Surgery

## 2014-12-30 ENCOUNTER — Other Ambulatory Visit (HOSPITAL_COMMUNITY): Payer: Self-pay

## 2014-12-30 ENCOUNTER — Encounter (HOSPITAL_COMMUNITY): Payer: Medicare Other

## 2014-12-30 DIAGNOSIS — I5032 Chronic diastolic (congestive) heart failure: Secondary | ICD-10-CM | POA: Diagnosis not present

## 2014-12-30 DIAGNOSIS — Q613 Polycystic kidney, unspecified: Secondary | ICD-10-CM

## 2014-12-30 DIAGNOSIS — K219 Gastro-esophageal reflux disease without esophagitis: Secondary | ICD-10-CM | POA: Diagnosis not present

## 2014-12-30 DIAGNOSIS — R0600 Dyspnea, unspecified: Secondary | ICD-10-CM

## 2014-12-30 DIAGNOSIS — E780 Pure hypercholesterolemia: Secondary | ICD-10-CM | POA: Diagnosis not present

## 2014-12-30 DIAGNOSIS — E785 Hyperlipidemia, unspecified: Secondary | ICD-10-CM | POA: Diagnosis present

## 2014-12-30 DIAGNOSIS — R011 Cardiac murmur, unspecified: Secondary | ICD-10-CM | POA: Diagnosis not present

## 2014-12-30 DIAGNOSIS — Z87891 Personal history of nicotine dependence: Secondary | ICD-10-CM | POA: Diagnosis not present

## 2014-12-30 DIAGNOSIS — J189 Pneumonia, unspecified organism: Secondary | ICD-10-CM | POA: Diagnosis not present

## 2014-12-30 DIAGNOSIS — N2581 Secondary hyperparathyroidism of renal origin: Secondary | ICD-10-CM | POA: Diagnosis not present

## 2014-12-30 DIAGNOSIS — L03119 Cellulitis of unspecified part of limb: Secondary | ICD-10-CM

## 2014-12-30 DIAGNOSIS — J9600 Acute respiratory failure, unspecified whether with hypoxia or hypercapnia: Secondary | ICD-10-CM | POA: Diagnosis not present

## 2014-12-30 DIAGNOSIS — Z89511 Acquired absence of right leg below knee: Secondary | ICD-10-CM | POA: Diagnosis not present

## 2014-12-30 DIAGNOSIS — N2889 Other specified disorders of kidney and ureter: Secondary | ICD-10-CM

## 2014-12-30 DIAGNOSIS — I252 Old myocardial infarction: Secondary | ICD-10-CM | POA: Diagnosis not present

## 2014-12-30 DIAGNOSIS — D638 Anemia in other chronic diseases classified elsewhere: Secondary | ICD-10-CM | POA: Diagnosis not present

## 2014-12-30 DIAGNOSIS — E875 Hyperkalemia: Secondary | ICD-10-CM | POA: Diagnosis not present

## 2014-12-30 DIAGNOSIS — I5023 Acute on chronic systolic (congestive) heart failure: Secondary | ICD-10-CM | POA: Diagnosis not present

## 2014-12-30 DIAGNOSIS — I96 Gangrene, not elsewhere classified: Secondary | ICD-10-CM

## 2014-12-30 DIAGNOSIS — N186 End stage renal disease: Secondary | ICD-10-CM

## 2014-12-30 DIAGNOSIS — I7092 Chronic total occlusion of artery of the extremities: Secondary | ICD-10-CM | POA: Diagnosis not present

## 2014-12-30 DIAGNOSIS — Y95 Nosocomial condition: Secondary | ICD-10-CM | POA: Diagnosis not present

## 2014-12-30 DIAGNOSIS — I5021 Acute systolic (congestive) heart failure: Secondary | ICD-10-CM | POA: Diagnosis not present

## 2014-12-30 DIAGNOSIS — Z992 Dependence on renal dialysis: Secondary | ICD-10-CM | POA: Diagnosis not present

## 2014-12-30 DIAGNOSIS — I5042 Chronic combined systolic (congestive) and diastolic (congestive) heart failure: Secondary | ICD-10-CM

## 2014-12-30 DIAGNOSIS — L97519 Non-pressure chronic ulcer of other part of right foot with unspecified severity: Secondary | ICD-10-CM | POA: Diagnosis not present

## 2014-12-30 DIAGNOSIS — M79671 Pain in right foot: Secondary | ICD-10-CM | POA: Diagnosis not present

## 2014-12-30 DIAGNOSIS — L02619 Cutaneous abscess of unspecified foot: Secondary | ICD-10-CM | POA: Insufficient documentation

## 2014-12-30 DIAGNOSIS — J438 Other emphysema: Secondary | ICD-10-CM | POA: Insufficient documentation

## 2014-12-30 DIAGNOSIS — I70261 Atherosclerosis of native arteries of extremities with gangrene, right leg: Secondary | ICD-10-CM | POA: Diagnosis present

## 2014-12-30 DIAGNOSIS — I2510 Atherosclerotic heart disease of native coronary artery without angina pectoris: Secondary | ICD-10-CM | POA: Diagnosis present

## 2014-12-30 DIAGNOSIS — I5020 Unspecified systolic (congestive) heart failure: Secondary | ICD-10-CM | POA: Diagnosis not present

## 2014-12-30 DIAGNOSIS — I70235 Atherosclerosis of native arteries of right leg with ulceration of other part of foot: Secondary | ICD-10-CM | POA: Diagnosis not present

## 2014-12-30 DIAGNOSIS — L03115 Cellulitis of right lower limb: Secondary | ICD-10-CM | POA: Diagnosis not present

## 2014-12-30 DIAGNOSIS — I132 Hypertensive heart and chronic kidney disease with heart failure and with stage 5 chronic kidney disease, or end stage renal disease: Secondary | ICD-10-CM | POA: Diagnosis not present

## 2014-12-30 DIAGNOSIS — D62 Acute posthemorrhagic anemia: Secondary | ICD-10-CM | POA: Diagnosis not present

## 2014-12-30 DIAGNOSIS — I429 Cardiomyopathy, unspecified: Secondary | ICD-10-CM | POA: Diagnosis not present

## 2014-12-30 DIAGNOSIS — Z9981 Dependence on supplemental oxygen: Secondary | ICD-10-CM | POA: Diagnosis not present

## 2014-12-30 DIAGNOSIS — M109 Gout, unspecified: Secondary | ICD-10-CM | POA: Diagnosis not present

## 2014-12-30 DIAGNOSIS — Z7982 Long term (current) use of aspirin: Secondary | ICD-10-CM | POA: Diagnosis not present

## 2014-12-30 DIAGNOSIS — J441 Chronic obstructive pulmonary disease with (acute) exacerbation: Secondary | ICD-10-CM | POA: Diagnosis not present

## 2014-12-30 DIAGNOSIS — K922 Gastrointestinal hemorrhage, unspecified: Secondary | ICD-10-CM | POA: Diagnosis present

## 2014-12-30 DIAGNOSIS — R5381 Other malaise: Secondary | ICD-10-CM | POA: Insufficient documentation

## 2014-12-30 DIAGNOSIS — Z89519 Acquired absence of unspecified leg below knee: Secondary | ICD-10-CM | POA: Insufficient documentation

## 2014-12-30 LAB — COMPREHENSIVE METABOLIC PANEL
ALT: 47 U/L (ref 0–53)
AST: 48 U/L — AB (ref 0–37)
Albumin: 2.9 g/dL — ABNORMAL LOW (ref 3.5–5.2)
Alkaline Phosphatase: 83 U/L (ref 39–117)
Anion gap: 12 (ref 5–15)
BILIRUBIN TOTAL: 0.4 mg/dL (ref 0.3–1.2)
BUN: 42 mg/dL — AB (ref 6–23)
CALCIUM: 8.8 mg/dL (ref 8.4–10.5)
CHLORIDE: 99 mmol/L (ref 96–112)
CO2: 27 mmol/L (ref 19–32)
CREATININE: 7.8 mg/dL — AB (ref 0.50–1.35)
GFR calc Af Amer: 7 mL/min — ABNORMAL LOW (ref >90)
GFR, EST NON AFRICAN AMERICAN: 6 mL/min — AB (ref >90)
Glucose, Bld: 117 mg/dL — ABNORMAL HIGH (ref 70–99)
Potassium: 5.4 mmol/L — ABNORMAL HIGH (ref 3.5–5.1)
Sodium: 138 mmol/L (ref 135–145)
Total Protein: 5.8 g/dL — ABNORMAL LOW (ref 6.0–8.3)

## 2014-12-30 LAB — CBC WITH DIFFERENTIAL/PLATELET
BASOS PCT: 0 % (ref 0–1)
Basophils Absolute: 0 10*3/uL (ref 0.0–0.1)
Eosinophils Absolute: 0 10*3/uL (ref 0.0–0.7)
Eosinophils Relative: 0 % (ref 0–5)
HCT: 27.1 % — ABNORMAL LOW (ref 39.0–52.0)
HEMOGLOBIN: 8.3 g/dL — AB (ref 13.0–17.0)
Lymphocytes Relative: 5 % — ABNORMAL LOW (ref 12–46)
Lymphs Abs: 0.7 10*3/uL (ref 0.7–4.0)
MCH: 30 pg (ref 26.0–34.0)
MCHC: 30.6 g/dL (ref 30.0–36.0)
MCV: 97.8 fL (ref 78.0–100.0)
MONO ABS: 0.9 10*3/uL (ref 0.1–1.0)
Monocytes Relative: 6 % (ref 3–12)
NEUTROS ABS: 12.8 10*3/uL — AB (ref 1.7–7.7)
NEUTROS PCT: 89 % — AB (ref 43–77)
PLATELETS: 290 10*3/uL (ref 150–400)
RBC: 2.77 MIL/uL — ABNORMAL LOW (ref 4.22–5.81)
RDW: 19.1 % — ABNORMAL HIGH (ref 11.5–15.5)
WBC: 14.4 10*3/uL — ABNORMAL HIGH (ref 4.0–10.5)

## 2014-12-30 LAB — I-STAT CG4 LACTIC ACID, ED
LACTIC ACID, VENOUS: 1.45 mmol/L (ref 0.5–2.0)
Lactic Acid, Venous: 1.07 mmol/L (ref 0.5–2.0)

## 2014-12-30 MED ORDER — ACETAMINOPHEN 650 MG RE SUPP
650.0000 mg | Freq: Four times a day (QID) | RECTAL | Status: DC | PRN
Start: 2014-12-30 — End: 2015-01-13

## 2014-12-30 MED ORDER — SODIUM POLYSTYRENE SULFONATE 15 GM/60ML PO SUSP
15.0000 g | Freq: Once | ORAL | Status: AC
  Administered 2014-12-30: 15 g via ORAL
  Filled 2014-12-30: qty 60

## 2014-12-30 MED ORDER — TETANUS-DIPHTH-ACELL PERTUSSIS 5-2.5-18.5 LF-MCG/0.5 IM SUSP
0.5000 mL | Freq: Once | INTRAMUSCULAR | Status: AC
  Administered 2014-12-30: 0.5 mL via INTRAMUSCULAR
  Filled 2014-12-30: qty 0.5

## 2014-12-30 MED ORDER — DELFLEX-LC/4.25% DEXTROSE 483 MOSM/L IP SOLN: INTRAPERITONEAL | Status: DC

## 2014-12-30 MED ORDER — VANCOMYCIN HCL IN DEXTROSE 1-5 GM/200ML-% IV SOLN: 1000.0000 mg | Freq: Once | INTRAVENOUS | Status: DC

## 2014-12-30 MED ORDER — SODIUM CHLORIDE 0.9 % IJ SOLN
3.0000 mL | Freq: Two times a day (BID) | INTRAMUSCULAR | Status: DC
  Administered 2014-12-30 – 2015-01-12 (×22): 3 mL via INTRAVENOUS

## 2014-12-30 MED ORDER — SEVELAMER CARBONATE 800 MG PO TABS
4000.0000 mg | ORAL_TABLET | Freq: Three times a day (TID) | ORAL | Status: DC
  Administered 2014-12-31 – 2015-01-13 (×30): 4000 mg via ORAL
  Filled 2014-12-30 (×45): qty 5

## 2014-12-30 MED ORDER — ONDANSETRON HCL 4 MG/2ML IJ SOLN
4.0000 mg | Freq: Four times a day (QID) | INTRAMUSCULAR | Status: DC | PRN
  Administered 2015-01-05: 4 mg via INTRAVENOUS
  Filled 2014-12-30: qty 2

## 2014-12-30 MED ORDER — MORPHINE SULFATE 2 MG/ML IJ SOLN
1.0000 mg | INTRAMUSCULAR | Status: DC | PRN
  Administered 2014-12-31 – 2015-01-04 (×4): 1 mg via INTRAVENOUS
  Filled 2014-12-30 (×2): qty 1

## 2014-12-30 MED ORDER — MORPHINE SULFATE 4 MG/ML IJ SOLN
4.0000 mg | Freq: Once | INTRAMUSCULAR | Status: AC
  Administered 2014-12-30: 4 mg via INTRAVENOUS
  Filled 2014-12-30: qty 1

## 2014-12-30 MED ORDER — RENA-VITE PO TABS
1.0000 | ORAL_TABLET | Freq: Every day | ORAL | Status: DC
  Administered 2014-12-31 – 2015-01-13 (×13): 1 via ORAL
  Filled 2014-12-30 (×14): qty 1

## 2014-12-30 MED ORDER — CLONIDINE HCL 0.2 MG PO TABS
0.2000 mg | ORAL_TABLET | Freq: Two times a day (BID) | ORAL | Status: DC
  Administered 2014-12-30 – 2015-01-13 (×25): 0.2 mg via ORAL
  Filled 2014-12-30 (×7): qty 1
  Filled 2014-12-30: qty 2
  Filled 2014-12-30 (×22): qty 1

## 2014-12-30 MED ORDER — ATORVASTATIN CALCIUM 40 MG PO TABS
40.0000 mg | ORAL_TABLET | Freq: Every day | ORAL | Status: DC
  Administered 2014-12-31 – 2015-01-12 (×12): 40 mg via ORAL
  Filled 2014-12-30 (×15): qty 1

## 2014-12-30 MED ORDER — DELFLEX-LC/2.5% DEXTROSE 394 MOSM/L IP SOLN
INTRAPERITONEAL | Status: DC
  Administered 2014-12-31: 5000 mL via INTRAPERITONEAL

## 2014-12-30 MED ORDER — DELFLEX-LC/2.5% DEXTROSE 394 MOSM/L IP SOLN: INTRAPERITONEAL | Status: DC

## 2014-12-30 MED ORDER — OXYCODONE-ACETAMINOPHEN 5-325 MG PO TABS
1.0000 | ORAL_TABLET | ORAL | Status: DC | PRN
Start: 2014-12-30 — End: 2015-01-13
  Administered 2014-12-30 – 2015-01-12 (×40): 1 via ORAL
  Filled 2014-12-30 (×36): qty 1

## 2014-12-30 MED ORDER — ACETAMINOPHEN 325 MG PO TABS
650.0000 mg | ORAL_TABLET | Freq: Four times a day (QID) | ORAL | Status: DC | PRN
Start: 2014-12-30 — End: 2015-01-13
  Administered 2015-01-04 – 2015-01-05 (×2): 650 mg via ORAL
  Filled 2014-12-30 (×2): qty 2

## 2014-12-30 MED ORDER — DELFLEX-LC/4.25% DEXTROSE 483 MOSM/L IP SOLN
INTRAPERITONEAL | Status: DC
  Administered 2014-12-31: 5000 mL via INTRAPERITONEAL

## 2014-12-30 MED ORDER — COLCHICINE 0.6 MG PO TABS
0.6000 mg | ORAL_TABLET | Freq: Every day | ORAL | Status: DC | PRN
  Filled 2014-12-30: qty 1

## 2014-12-30 MED ORDER — HEPARIN SODIUM (PORCINE) 5000 UNIT/ML IJ SOLN
5000.0000 [IU] | Freq: Three times a day (TID) | INTRAMUSCULAR | Status: DC
  Administered 2014-12-31 – 2015-01-12 (×33): 5000 [IU] via SUBCUTANEOUS
  Filled 2014-12-30 (×45): qty 1

## 2014-12-30 MED ORDER — PREDNISONE (PAK) 10 MG PO TABS: 10.0000 mg | ORAL_TABLET | ORAL | Status: DC

## 2014-12-30 MED ORDER — CARVEDILOL 25 MG PO TABS
25.0000 mg | ORAL_TABLET | Freq: Two times a day (BID) | ORAL | Status: DC
  Administered 2014-12-31 – 2015-01-13 (×24): 25 mg via ORAL
  Filled 2014-12-30 (×32): qty 1

## 2014-12-30 MED ORDER — HYDRALAZINE HCL 50 MG PO TABS
50.0000 mg | ORAL_TABLET | Freq: Three times a day (TID) | ORAL | Status: DC
  Administered 2014-12-30 – 2015-01-02 (×9): 50 mg via ORAL
  Filled 2014-12-30 (×13): qty 1

## 2014-12-30 MED ORDER — LISINOPRIL 10 MG PO TABS
10.0000 mg | ORAL_TABLET | Freq: Every day | ORAL | Status: DC
  Administered 2014-12-31 – 2015-01-02 (×4): 10 mg via ORAL
  Filled 2014-12-30 (×5): qty 1

## 2014-12-30 MED ORDER — ROPINIROLE HCL 0.25 MG PO TABS
0.2500 mg | ORAL_TABLET | Freq: Every day | ORAL | Status: DC
  Administered 2014-12-30 – 2014-12-31 (×2): 0.25 mg via ORAL
  Administered 2015-01-01 – 2015-01-03 (×3): 0.5 mg via ORAL
  Administered 2015-01-04: 0.25 mg via ORAL
  Administered 2015-01-05 – 2015-01-08 (×4): 0.5 mg via ORAL
  Administered 2015-01-09 – 2015-01-11 (×3): 0.25 mg via ORAL
  Administered 2015-01-12: 0.5 mg via ORAL
  Filled 2014-12-30 (×15): qty 2

## 2014-12-30 MED ORDER — CALCITRIOL 0.5 MCG PO CAPS
0.5000 ug | ORAL_CAPSULE | Freq: Every day | ORAL | Status: DC
  Administered 2014-12-31 – 2015-01-13 (×13): 0.5 ug via ORAL
  Filled 2014-12-30 (×15): qty 1

## 2014-12-30 MED ORDER — MORPHINE SULFATE 2 MG/ML IJ SOLN
1.0000 mg | INTRAMUSCULAR | Status: DC | PRN
Start: 2014-12-30 — End: 2015-01-13
  Administered 2015-01-03 – 2015-01-12 (×14): 1 mg via INTRAVENOUS
  Filled 2014-12-30 (×13): qty 1

## 2014-12-30 MED ORDER — PIPERACILLIN-TAZOBACTAM IN DEX 2-0.25 GM/50ML IV SOLN
2.2500 g | Freq: Three times a day (TID) | INTRAVENOUS | Status: DC
  Administered 2014-12-30: 2.25 g via INTRAVENOUS
  Filled 2014-12-30 (×2): qty 50

## 2014-12-30 MED ORDER — PANTOPRAZOLE SODIUM 40 MG PO TBEC
40.0000 mg | DELAYED_RELEASE_TABLET | Freq: Every day | ORAL | Status: DC
  Administered 2014-12-31 – 2015-01-13 (×13): 40 mg via ORAL
  Filled 2014-12-30 (×13): qty 1

## 2014-12-30 MED ORDER — ONDANSETRON HCL 4 MG PO TABS: 4.0000 mg | ORAL_TABLET | Freq: Four times a day (QID) | ORAL | Status: DC | PRN

## 2014-12-30 MED ORDER — OXYCODONE-ACETAMINOPHEN 5-325 MG PO TABS: 1.0000 | ORAL_TABLET | Freq: Four times a day (QID) | ORAL | Status: DC | PRN

## 2014-12-30 MED ORDER — CYCLOBENZAPRINE HCL 10 MG PO TABS
5.0000 mg | ORAL_TABLET | Freq: Three times a day (TID) | ORAL | Status: DC | PRN
  Administered 2014-12-31 – 2015-01-06 (×5): 5 mg via ORAL
  Filled 2014-12-30 (×2): qty 1
  Filled 2014-12-30: qty 0.5
  Filled 2014-12-30 (×3): qty 1

## 2014-12-30 MED ORDER — VANCOMYCIN HCL IN DEXTROSE 1-5 GM/200ML-% IV SOLN
1000.0000 mg | Freq: Once | INTRAVENOUS | Status: AC
  Administered 2014-12-30: 1000 mg via INTRAVENOUS
  Filled 2014-12-30: qty 200

## 2014-12-30 MED ORDER — AMLODIPINE BESYLATE 10 MG PO TABS
10.0000 mg | ORAL_TABLET | Freq: Every day | ORAL | Status: DC
Start: 2014-12-30 — End: 2015-01-08
  Administered 2014-12-31 – 2015-01-07 (×9): 10 mg via ORAL
  Filled 2014-12-30 (×10): qty 1

## 2014-12-30 MED ORDER — ASPIRIN 325 MG PO TABS
325.0000 mg | ORAL_TABLET | Freq: Every day | ORAL | Status: DC
  Administered 2014-12-31 – 2015-01-13 (×12): 325 mg via ORAL
  Filled 2014-12-30 (×15): qty 1

## 2014-12-30 MED ORDER — BOOST / RESOURCE BREEZE PO LIQD
1.0000 | Freq: Three times a day (TID) | ORAL | Status: DC
  Administered 2014-12-30 – 2015-01-13 (×30): 1 via ORAL

## 2014-12-30 NOTE — ED Notes (Signed)
Zosyn infusion complete.

## 2014-12-30 NOTE — Progress Notes (Signed)
Attempted to call for report on patient RN stated she would call back.

## 2014-12-30 NOTE — H&P (Addendum)
[facility] History and Physical  SABEN DONIGAN XIP:[id number] DOB: 09-Jun-1932 DOA: 12/30/2014  Referring physician: ED physician PCP: [name], MD  Specialists:   Chief Complaint: right foot pain  HPI: Randy [name] is a 81 y.o. male with hx of HLd, HTN, ESRD on peritoneal dialysis (last dialysis was yesterday), gout, history of pericarditis, history of lower GI bleeding, anemia, congestive heart failure, on home oxygen, CAD, who presents with right foot pain.  Patient reports that he fell 2 weeks ago and injured his right foot over 2nd, 3rd and 4th. He has skin laceration in these toes. He has progressive pain since then. He was placed on Keflex for the last week, but still has severe pain. He came to the ED and had normal lab work, then was sent home yesterday. However his pain has worsened since yesterday. He cut his dialysis short yesterday. He was sent in from PMD for possible cellulitis. He reports that he had one episode of chest discomfort yesterday, which has resolved spontaneously. Currently no any chest pain.  Patient denies fever, chills, fatigue, headaches, cough, chest pain, SOB, abdominal pain, diarrhea, constipation, dysuria, urgency, frequency, hematuria, skin rashes, joint pain or leg swelling. No unilateral weakness, numbness or tingling sensations. No vision change or hearing loss.  Of note, patient had arterial Seg signal doppler study on 01/06/15, which showed evidence of significant peripheral small vessel tibial disease. ABIs could not be obtained per comment.   In ED, patient was found to have WBC 14.4 (patient is on prednisone tapering), temperature normal, mildly tachycardia, potassium 5.4 without T-wave peaking on EKG. x-ray of right foot is negative for acute bony abnormalities. EKG showed T-wave inversion in inferior leads and V4 to 6, LAE, LVH, all of which are similar to previous EKG on 10/20/14. Patient is admitted to inpatient for further evaluation  and treatment. Renal was consulted for dialysis.  Review of Systems: As presented in the history of presenting illness, rest negative.  Where does patient live?  At home  Can patient participate in ADLs? little  Allergy: No Known Allergies  Past Medical History  Diagnosis Date  . Hypertensive heart disease   . Hypercholesterolemia   . Gout   . Secondary cardiomyopathy     LVEF 40-45%  . Anemia of chronic disease   . Hypertension   . Arthritis   . Peripheral vascular disease   . ESRD on peritoneal dialysis   . On home oxygen therapy     "use what I need to" (08/17/2014)  . Pneumonia 09/2012  . Pericarditis 08/17/2014  . NSTEMI (non-ST elevated myocardial infarction) 08/17/2014    Past Surgical History  Procedure Laterality Date  . Knee arthroscopy Right 2007  . Back surgery    . Hemorrhoid surgery  1970's  . Ganglion cyst excision  01/03/2012    Procedure: REMOVAL GANGLION OF WRIST;  Surgeon: Scherry Ran, MD;  Location: [facility];  Service: General;  Laterality: Right;  . Colonoscopy    . Av fistula placement  08/24/2012    Procedure: ARTERIOVENOUS (AV) FISTULA CREATION;  Surgeon: Rosetta Posner, MD;  Location: [facility];  Service: Vascular;  Laterality: Left;  . Insertion of dialysis catheter Right      neck  . Insertion of dialysis catheter  10/19/2012    Procedure: INSERTION OF DIALYSIS CATHETER;  Surgeon: Rosetta Posner, MD;  Location: [facility];  Service: Vascular;  Laterality: N/A;  REMOVE TEMPORARY CATH  . Cardiac catheterization  08/17/2014  .  Cholecystectomy    . Lumbar disc surgery  2004  . Left heart catheterization with coronary angiogram N/A 08/17/2014    Procedure: LEFT HEART CATHETERIZATION WITH CORONARY ANGIOGRAM;  Surgeon: Larey Dresser, MD;  Location: [facility] CATH LAB;  Service: Cardiovascular;  Laterality: N/A;  . Colonoscopy Left 09/26/2014    Procedure: COLONOSCOPY;  Surgeon: Arta Silence, MD;  Location: [facility] ENDOSCOPY;  Service: Endoscopy;  Laterality: Left;     Social History:  reports that he quit smoking about 16 years ago. His smoking use included Cigarettes. He has a 30 pack-year smoking history. He quit smokeless tobacco use about 21 years ago. His smokeless tobacco use included Chew. He reports that he does not drink alcohol or use illicit drugs.  Family History:  Family History  Problem Relation Age of Onset  . Arthritis    . Cancer    . Kidney disease    . Anesthesia problems Neg Hx   . Hypotension Neg Hx   . Malignant hyperthermia Neg Hx   . Pseudochol deficiency Neg Hx   . Cancer Sister   . Cancer Brother     colon  . Cancer Brother     colon     Prior to Admission medications   Medication Sig Start Date End Date Taking? Authorizing Provider  amLODipine (NORVASC) 10 MG tablet Take 1 tablet (10 mg total) by mouth daily. 06/23/14   Lucia Gaskins, MD  aspirin 325 MG tablet Take 1 tablet (325 mg total) by mouth 2 (two) times daily. Patient taking differently: Take 325 mg by mouth daily.  08/23/14   Rhonda G Barrett, PA-C  calcitRIOL (ROCALTROL) 0.5 MCG capsule Take 1 capsule (0.5 mcg total) by mouth daily. 06/23/14   Lucia Gaskins, MD  carvedilol (COREG) 25 MG tablet Take 1 tablet (25 mg total) by mouth 2 (two) times daily with a meal. 09/02/14   Lucia Gaskins, MD  cephALEXin (KEFLEX) 250 MG capsule Take 1 capsule by mouth 4 (four) times daily. Starting 12/26/2014 x 7 days. 12/26/14   Historical Provider, MD  cloNIDine (CATAPRES) 0.1 MG tablet Take 1 tablet (0.1 mg total) by mouth 2 (two) times daily. Patient not taking: Reported on 12/27/2014 08/23/14   Evelene Croon Barrett, PA-C  cloNIDine (CATAPRES) 0.2 MG tablet Take 1 tablet by mouth 2 (two) times daily. 12/27/14   Historical Provider, MD  COLCRYS 0.6 MG tablet Take 1 tablet by mouth daily as needed (gout flare up).  10/31/14   Historical Provider, MD  docusate sodium 100 MG CAPS Take 100 mg by mouth 2 (two) times daily. Patient not taking: Reported on 12/27/2014 08/23/14   Evelene Croon  Barrett, PA-C  feeding supplement, RESOURCE BREEZE, (RESOURCE BREEZE) LIQD Take 1 Container by mouth 3 (three) times daily between meals. 09/30/14   Annita Brod, MD  hydrALAZINE (APRESOLINE) 25 MG tablet Take 1 tablet (25 mg total) by mouth 3 (three) times daily. Patient not taking: Reported on 12/27/2014 08/23/14   Evelene Croon Barrett, PA-C  hydrALAZINE (APRESOLINE) 50 MG tablet Take 1 tablet by mouth 3 (three) times daily. 10/08/14   Historical Provider, MD  labetalol (NORMODYNE) 200 MG tablet Take 2 tablets (400 mg total) by mouth 2 (two) times daily. Patient taking differently: Take 200 mg by mouth at bedtime.  07/17/12   Satira Sark, MD  lisinopril (PRINIVIL,ZESTRIL) 10 MG tablet Take 1 tablet (10 mg total) by mouth daily. 09/02/14   Lucia Gaskins, MD  multivitamin (RENA-VIT) TABS tablet Take 1 tablet  by mouth daily.    Historical Provider, MD  oxyCODONE-acetaminophen (PERCOCET/ROXICET) 5-325 MG per tablet Take 1 tablet by mouth every 6 (six) hours as needed for severe pain. 12/27/14   Merryl Hacker, MD  pantoprazole (PROTONIX) 40 MG tablet Take 1 tablet (40 mg total) by mouth daily at 12 noon. Patient taking differently: Take 40 mg by mouth daily as needed (acid reflux).  08/23/14   Rhonda G Barrett, PA-C  polyethylene glycol (MIRALAX / GLYCOLAX) packet Take 17 g by mouth daily. Patient not taking: Reported on 12/27/2014 08/23/14   Evelene Croon Barrett, PA-C  potassium chloride (K-DUR,KLOR-CON) 10 MEQ tablet Take 1 tablet by mouth daily. 12/13/14   Historical Provider, MD  predniSONE (STERAPRED UNI-PAK) 10 MG tablet Take 1-6 tablets by mouth See admin instructions. Take 6 tabs x 2 days, 5 tabs x 2 days, 4 tabs x 2 days, 3 tabs x 2 days, 2 tabs x 2 days and then 1 tab x 2 days. 12/23/14   Historical Provider, MD  rOPINIRole (REQUIP) 0.25 MG tablet Take 0.25-0.5 mg by mouth at bedtime. Takes depending on bad restless legs are.    Historical Provider, MD  sevelamer carbonate (RENVELA) 800 MG tablet  Take 2,400-4,000 mg by mouth 3 (three) times daily with meals. To take 5 with meals and 2 with snacks    Historical Provider, MD  simvastatin (ZOCOR) 40 MG tablet Take 1 tablet (40 mg total) by mouth every Monday, Wednesday, and Friday. Patient not taking: Reported on 12/27/2014 08/23/14   Evelene Croon Barrett, PA-C  simvastatin (ZOCOR) 80 MG tablet Take 1 tablet by mouth daily. 12/06/14   Historical Provider, MD    Physical Exam: Filed Vitals:   12/30/14 1744 12/30/14 1800 12/30/14 1916 12/30/14 2000  BP: 148/84 149/84 152/86 156/83  Pulse:  95 96 96  Temp: 98.4 F (36.9 C)     TempSrc: Oral     Resp: 20  12   Height:      Weight:      SpO2: 99% 98% 99% 97%   General: Not in acute distress HEENT:       Eyes: PERRL, EOMI, no scleral icterus       ENT: No discharge from the ears and nose, no pharynx injection, no tonsillar enlargement.        Neck: No JVD, no bruit, no mass felt. Cardiac: S1/S2, RRR, No murmurs, No gallops or rubs Pulm: Good air movement bilaterally. Clear to auscultation bilaterally. No rales, wheezing, rhonchi or rubs. Abd: Soft, nondistended, nontender, no rebound pain, no organomegaly, BS present Ext: No edema bilaterally. 2+DP/PT pulse on the left side and very weak pulse on the right side. R 2nd, 3rd and 4th toes has skin breakdown. There is mild erythema, no pus. Diminished pedal pulses and foot is cool.  Musculoskeletal: No joint deformities, erythema, or stiffness, ROM full Skin: No rashes.  Neuro: Alert and oriented X3, cranial nerves II-XII grossly intact, muscle strength 5/5 in all extremeties, sensation to light touch intact.  Psych: Patient is not psychotic, no suicidal or hemocidal ideation.  Labs on Admission:  Basic Metabolic Panel:  Recent Labs Lab 12/27/14 1540 12/30/14 1754  NA 137 138  K 4.5 5.4*  CL 99 99  CO2 26 27  GLUCOSE 90 117*  BUN 25* 42*  CREATININE 5.52* 7.80*  CALCIUM 8.9 8.8   Liver Function Tests:  Recent Labs Lab  12/30/14 1754  AST 48*  ALT 47  ALKPHOS 83  BILITOT  0.4  PROT 5.8*  ALBUMIN 2.9*   No results for input(s): LIPASE, AMYLASE in the last 168 hours. No results for input(s): AMMONIA in the last 168 hours. CBC:  Recent Labs Lab 12/27/14 1540 12/30/14 1754  WBC 10.4 14.4*  NEUTROABS 8.9* 12.8*  HGB 9.6* 8.3*  HCT 30.7* 27.1*  MCV 99.4 97.8  PLT 309 290   Cardiac Enzymes: No results for input(s): CKTOTAL, CKMB, CKMBINDEX, TROPONINI in the last 168 hours.  BNP (last 3 results) No results for input(s): BNP in the last 8760 hours.  ProBNP (last 3 results)  Recent Labs  06/21/14 1046 08/17/14 0625 08/29/14 0928  PROBNP >70000.0* >70000.0* >70000.0*    CBG: No results for input(s): GLUCAP in the last 168 hours.  Radiological Exams on Admission: Dg Foot Complete Right  12/30/2014   CLINICAL DATA:  Fall from bed 3 weeks ago. Trauma to right foot, hitting a a on a dresser/night standard. Increased swelling and throbbing of the right foot since then. The patient states the foot has been getting colder and feeling numb. He denies diabetes.  EXAM: RIGHT FOOT COMPLETE - 3+ VIEW  COMPARISON:  Right foot radiographs 12/26/2014.  FINDINGS: Mild osteopenia is again noted. No acute bone or soft tissue abnormality is present. Microvascular calcifications are present.  IMPRESSION: 1. No acute abnormality or significant interval change. 2. Mild osteopenia.   Electronically Signed   By: San Morelle M.D.   On: 12/30/2014 19:04    EKG: Independently reviewed. EKG showed T-wave inversion in inferior leads and V4 to 6, LAE, LVH, all of which are similar to previous EKG on 10/20/14.  Assessment/Plan Principal Problem:   Foot pain, right Active Problems:   HLD (hyperlipidemia)   Polycystic kidney   End stage renal disease, has been peritoneal, now hemodialysis   Lower GI bleed   CHF (congestive heart failure)   CAD (coronary artery disease)  Right foot pain: His foot the pain has  been persistent after he injured his toes. He was treated with Keflex due to suspected cellulitis,without improvement. To my examination, he seems have have mild cellulitis if any. Regarding whether septic or not, his lactate is 2.2, with tachycarida which can also be explained by pain and leukocytosis with WBC 14.4, which can also be explained by current prednisone use. He may be mildly septic though I am no very sure. Actually his foot is cool, indicating very poor circulation. Patient had arterial Seg signal doppler study on 01/06/15, which showed evidence of significant peripheral small vessel tibial disease. His foot pain is likely mainly due to poor circulation or dry gangrene. Patient received 1 dose of the vancomycin and Zosyn in the emergency room, will continue. I will let wound care team to evaluate the patient in AM.  -will admit to tele bed given hx of CHF and hyperkalemia -Pain control: When necessary Percocet for moderate pain and morphine for severe pain - IV vancomycin and Zosyn started in ED, will continue -Consult the wound care team -will get Procalcitonin and trend lactic acid level  -May consult to ortho and VS in AM -IVF: 75 cc/h given EF 35% and ESRD which limits the aggressive IVF.  ESRD-HD and hyperkalemia: Potassium 5.4, no T-wave peaking. -Renal was consulted for dialysis.  Hypertension: -continue home medications: Amlodipine, clonidine, hydralazine, lisinopril, Coreg  Hyperlipidemia: -Lipitor  GERD: -Protonix  CAD: Currently no chest pain. EKG has no new change. -Continue aspirin and Lipitor  DVT ppx: SQ Heparin  Code Status: Full code Family Communication: None at bed side.  Disposition Plan: Admit to inpatient   Date of Service 12/30/2014    Ivor Costa [facility] Pager [phone]  If 7PM-7AM, please contact night-coverage [email or web address] Password [facility] 12/30/2014, 8:44 PM

## 2014-12-30 NOTE — ED Provider Notes (Signed)
CSN: [id number]     Arrival date & time 12/30/14  1616 History   First MD Initiated Contact with Patient 12/30/14 1730     Chief Complaint  Patient presents with  . Foot Pain     (Consider location/radiation/quality/duration/timing/severity/associated sxs/prior Treatment) The history is provided by the patient.  Randy [name] is a 81 y.o. male hx of HL, HTN, ESRD on dialysis (last dialysis was yesterday) here with right foot pain and redness. He fell 2 weeks ago and so wound in the right second and third toe. He has progressive pain since then. His concern for cellulitis so was placed on Keflex for the last week. Came to the ER because ago and had normal lab work was sent home. However his pain has worsened and yesterday cut his dialysis short yesterday. Sent in from PMD for worsening cellulitis.    Past Medical History  Diagnosis Date  . Hypertensive heart disease   . Hypercholesterolemia   . Gout   . Secondary cardiomyopathy     LVEF 40-45%  . Anemia of chronic disease   . Hypertension   . Arthritis   . Peripheral vascular disease   . ESRD on peritoneal dialysis   . On home oxygen therapy     "use what I need to" (08/17/2014)  . Pneumonia 09/2012  . Pericarditis 08/17/2014  . NSTEMI (non-ST elevated myocardial infarction) 08/17/2014   Past Surgical History  Procedure Laterality Date  . Knee arthroscopy Right 2007  . Back surgery    . Hemorrhoid surgery  1970's  . Ganglion cyst excision  01/03/2012    Procedure: REMOVAL GANGLION OF WRIST;  Surgeon: Scherry Ran, MD;  Location: [facility];  Service: General;  Laterality: Right;  . Colonoscopy    . Av fistula placement  08/24/2012    Procedure: ARTERIOVENOUS (AV) FISTULA CREATION;  Surgeon: Rosetta Posner, MD;  Location: [place];  Service: Vascular;  Laterality: Left;  . Insertion of dialysis catheter Right      neck  . Insertion of dialysis catheter  10/19/2012    Procedure: INSERTION OF DIALYSIS CATHETER;  Surgeon: Rosetta Posner, MD;  Location: [place];  Service: Vascular;  Laterality: N/A;  REMOVE TEMPORARY CATH  . Cardiac catheterization  08/17/2014  . Cholecystectomy    . Lumbar disc surgery  2004  . Left heart catheterization with coronary angiogram N/A 08/17/2014    Procedure: LEFT HEART CATHETERIZATION WITH CORONARY ANGIOGRAM;  Surgeon: Larey Dresser, MD;  Location: [facility] CATH LAB;  Service: Cardiovascular;  Laterality: N/A;  . Colonoscopy Left 09/26/2014    Procedure: COLONOSCOPY;  Surgeon: Arta Silence, MD;  Location: [facility] ENDOSCOPY;  Service: Endoscopy;  Laterality: Left;   Family History  Problem Relation Age of Onset  . Arthritis    . Cancer    . Kidney disease    . Anesthesia problems Neg Hx   . Hypotension Neg Hx   . Malignant hyperthermia Neg Hx   . Pseudochol deficiency Neg Hx   . Cancer Sister   . Cancer Brother     colon  . Cancer Brother     colon   History  Substance Use Topics  . Smoking status: Former Smoker -- 1.00 packs/day for 30 years    Types: Cigarettes    Quit date: 08/19/1998  . Smokeless tobacco: Former Systems developer    Types: Chew    Quit date: 12/31/1993  . Alcohol Use: No    Review of Systems  Skin:  Positive for color change.  All other systems reviewed and are negative.     Allergies  Review of patient's allergies indicates no known allergies.  Home Medications   Prior to Admission medications   Medication Sig Start Date End Date Taking? Authorizing Provider  amLODipine (NORVASC) 10 MG tablet Take 1 tablet (10 mg total) by mouth daily. 06/23/14   Lucia Gaskins, MD  aspirin 325 MG tablet Take 1 tablet (325 mg total) by mouth 2 (two) times daily. Patient taking differently: Take 325 mg by mouth daily.  08/23/14   Rhonda G Barrett, PA-C  calcitRIOL (ROCALTROL) 0.5 MCG capsule Take 1 capsule (0.5 mcg total) by mouth daily. 06/23/14   Lucia Gaskins, MD  carvedilol (COREG) 25 MG tablet Take 1 tablet (25 mg total) by mouth 2 (two) times daily with a meal. 09/02/14    Lucia Gaskins, MD  cephALEXin (KEFLEX) 250 MG capsule Take 1 capsule by mouth 4 (four) times daily. Starting 12/26/2014 x 7 days. 12/26/14   Historical Provider, MD  cloNIDine (CATAPRES) 0.1 MG tablet Take 1 tablet (0.1 mg total) by mouth 2 (two) times daily. Patient not taking: Reported on 12/27/2014 08/23/14   Evelene Croon Barrett, PA-C  cloNIDine (CATAPRES) 0.2 MG tablet Take 1 tablet by mouth 2 (two) times daily. 12/27/14   Historical Provider, MD  COLCRYS 0.6 MG tablet Take 1 tablet by mouth daily as needed (gout flare up).  10/31/14   Historical Provider, MD  docusate sodium 100 MG CAPS Take 100 mg by mouth 2 (two) times daily. Patient not taking: Reported on 12/27/2014 08/23/14   Evelene Croon Barrett, PA-C  feeding supplement, RESOURCE BREEZE, (RESOURCE BREEZE) LIQD Take 1 Container by mouth 3 (three) times daily between meals. 09/30/14   Annita Brod, MD  hydrALAZINE (APRESOLINE) 25 MG tablet Take 1 tablet (25 mg total) by mouth 3 (three) times daily. Patient not taking: Reported on 12/27/2014 08/23/14   Evelene Croon Barrett, PA-C  hydrALAZINE (APRESOLINE) 50 MG tablet Take 1 tablet by mouth 3 (three) times daily. 10/08/14   Historical Provider, MD  labetalol (NORMODYNE) 200 MG tablet Take 2 tablets (400 mg total) by mouth 2 (two) times daily. Patient taking differently: Take 200 mg by mouth at bedtime.  07/17/12   Satira Sark, MD  lisinopril (PRINIVIL,ZESTRIL) 10 MG tablet Take 1 tablet (10 mg total) by mouth daily. 09/02/14   Lucia Gaskins, MD  multivitamin (RENA-VIT) TABS tablet Take 1 tablet by mouth daily.    Historical Provider, MD  oxyCODONE-acetaminophen (PERCOCET/ROXICET) 5-325 MG per tablet Take 1 tablet by mouth every 6 (six) hours as needed for severe pain. 12/27/14   Merryl Hacker, MD  pantoprazole (PROTONIX) 40 MG tablet Take 1 tablet (40 mg total) by mouth daily at 12 noon. Patient taking differently: Take 40 mg by mouth daily as needed (acid reflux).  08/23/14   Rhonda G Barrett, PA-C   polyethylene glycol (MIRALAX / GLYCOLAX) packet Take 17 g by mouth daily. Patient not taking: Reported on 12/27/2014 08/23/14   Evelene Croon Barrett, PA-C  potassium chloride (K-DUR,KLOR-CON) 10 MEQ tablet Take 1 tablet by mouth daily. 12/13/14   Historical Provider, MD  predniSONE (STERAPRED UNI-PAK) 10 MG tablet Take 1-6 tablets by mouth See admin instructions. Take 6 tabs x 2 days, 5 tabs x 2 days, 4 tabs x 2 days, 3 tabs x 2 days, 2 tabs x 2 days and then 1 tab x 2 days. 12/23/14   Historical Provider, MD  rOPINIRole (  REQUIP) 0.25 MG tablet Take 0.25-0.5 mg by mouth at bedtime. Takes depending on bad restless legs are.    Historical Provider, MD  sevelamer carbonate (RENVELA) 800 MG tablet Take 2,400-4,000 mg by mouth 3 (three) times daily with meals. To take 5 with meals and 2 with snacks    Historical Provider, MD  simvastatin (ZOCOR) 40 MG tablet Take 1 tablet (40 mg total) by mouth every Monday, Wednesday, and Friday. Patient not taking: Reported on 12/27/2014 08/23/14   Evelene Croon Barrett, PA-C  simvastatin (ZOCOR) 80 MG tablet Take 1 tablet by mouth daily. 12/06/14   Historical Provider, MD   BP 152/86 mmHg  Pulse 96  Temp(Src) 98.4 F (36.9 C) (Oral)  Resp 12  Ht 5\' 9"  (1.753 m)  Wt 160 lb (72.576 kg)  BMI 23.62 kg/m2  SpO2 99% Physical Exam  Constitutional: He is oriented to person, place, and time.  Chronically ill, uncomfortable   HENT:  Head: Normocephalic.  Mouth/Throat: Oropharynx is clear and moist.  Eyes: Conjunctivae are normal. Pupils are equal, round, and reactive to light.  Neck: Normal range of motion. Neck supple.  Cardiovascular: Normal rate, regular rhythm and normal heart sounds.   Pulmonary/Chest: Effort normal and breath sounds normal. No respiratory distress. He has no wheezes. He has no rales.  Abdominal: Soft. Bowel sounds are normal. He exhibits no distension. There is no tenderness. There is no rebound and no guarding.  Musculoskeletal:  R 2nd, 3rd toes with  erythema with skin breakdown. No obvious fluctuance. Erythema reaches the entire foot. Diminished pedal pulses   Neurological: He is alert and oriented to person, place, and time. No cranial nerve deficit. Coordination normal.  Skin: Skin is dry.  Psychiatric: He has a normal mood and affect. His behavior is normal. Judgment and thought content normal.  Nursing note and vitals reviewed.   ED Course  Procedures (including critical care time) Labs Review Labs Reviewed  CBC WITH DIFFERENTIAL/PLATELET - Abnormal; Notable for the following:    WBC 14.4 (*)    RBC 2.77 (*)    Hemoglobin 8.3 (*)    HCT 27.1 (*)    RDW 19.1 (*)    Neutrophils Relative % 89 (*)    Neutro Abs 12.8 (*)    Lymphocytes Relative 5 (*)    All other components within normal limits  COMPREHENSIVE METABOLIC PANEL - Abnormal; Notable for the following:    Potassium 5.4 (*)    Glucose, Bld 117 (*)    BUN 42 (*)    Creatinine, Ser 7.80 (*)    Total Protein 5.8 (*)    Albumin 2.9 (*)    AST 48 (*)    GFR calc non Af Amer 6 (*)    GFR calc Af Amer 7 (*)    All other components within normal limits  CULTURE, BLOOD (ROUTINE X 2)  CULTURE, BLOOD (ROUTINE X 2)  I-STAT CG4 LACTIC ACID, ED    Imaging Review Dg Foot Complete Right  12/30/2014   CLINICAL DATA:  Fall from bed 3 weeks ago. Trauma to right foot, hitting a a on a dresser/night standard. Increased swelling and throbbing of the right foot since then. The patient states the foot has been getting colder and feeling numb. He denies diabetes.  EXAM: RIGHT FOOT COMPLETE - 3+ VIEW  COMPARISON:  Right foot radiographs 12/26/2014.  FINDINGS: Mild osteopenia is again noted. No acute bone or soft tissue abnormality is present. Microvascular calcifications are present.  IMPRESSION: 1.  No acute abnormality or significant interval change. 2. Mild osteopenia.   Electronically Signed   By: San Morelle M.D.   On: 12/30/2014 19:04     EKG Interpretation None       MDM   Final diagnoses:  None    Randy [name] is a 81 y.o. male here with worsening cellulitis. Will check labs and likely need IV abx.   7:56 PM WBC 14. Cr stable. K slightly elevated, given kayexelate. Given vanc/zosyn. Will admit.      Wandra Arthurs, MD 12/30/14 [phone]

## 2014-12-30 NOTE — ED Notes (Addendum)
Pt sent here from pcp office due to gangrene right foot 2nd toe. Pt reports pain to foot since falling two weeks ago.

## 2014-12-30 NOTE — Progress Notes (Signed)
ANTIBIOTIC CONSULT NOTE - INITIAL  Pharmacy Consult for Vancomycin and zosyn Indication: Cellulitis   No Known Allergies  Patient Measurements: Height: 5\' 9"  (175.3 cm) Weight: 160 lb (72.576 kg) IBW/kg (Calculated) : 70.7  Vital Signs: Temp: 98.4 F (36.9 C) (04/08 1744) Temp Source: Oral (04/08 1744) BP: 152/86 mmHg (04/08 1916) Pulse Rate: 96 (04/08 1916) Intake/Output from previous day:   Intake/Output from this shift:    Labs:  Recent Labs  12/30/14 1754  WBC 14.4*  HGB 8.3*  PLT 290  CREATININE 7.80*   Estimated Creatinine Clearance: 7.4 mL/min (by C-G formula based on Cr of 7.8). No results for input(s): VANCOTROUGH, VANCOPEAK, VANCORANDOM, GENTTROUGH, GENTPEAK, GENTRANDOM, TOBRATROUGH, TOBRAPEAK, TOBRARND, AMIKACINPEAK, AMIKACINTROU, AMIKACIN in the last 72 hours.   Microbiology: No results found for this or any previous visit (from the past 720 hour(s)).  Medical History: Past Medical History  Diagnosis Date  . Hypertensive heart disease   . Hypercholesterolemia   . Gout   . Secondary cardiomyopathy     LVEF 40-45%  . Anemia of chronic disease   . Hypertension   . Arthritis   . Peripheral vascular disease   . ESRD on peritoneal dialysis   . On home oxygen therapy     "use what I need to" (08/17/2014)  . Pneumonia 09/2012  . Pericarditis 08/17/2014  . NSTEMI (non-ST elevated myocardial infarction) 08/17/2014   Assessment: 89 YOM with hx of ESRD on PD presented to the ED with right foot pain and redness, failed outpatient treatment with PO keflex. Pharmacy is consulted to start vancomycin and zosyn. He is afebrile, wbc elevated at 14.4. One dose of vancomycin 1g was already given at [place].   Zosyn 4/8 >> Vancomycin 4/8 >>  4/8 Blood cx x 2 -  Goal of Therapy:  Vancomycin trough level 10-20 mcg/ml  Plan:  - Zosyn 2.25 g IV Q 8 hrs - Give another 1g vancomycin IV for total of 2g load - Check vancomycin level in 3-4 days and re-dose if level <  20 mcg/ml. - F/u plans for dialysis and adjust dosing schedule as needed.  Maryanna Shape, PharmD, BCPS  Clinical Pharmacist  Pager: [phone]   12/30/2014,7:31 PM

## 2014-12-30 NOTE — ED Notes (Signed)
Transporting patient to new room assignment. 

## 2014-12-31 ENCOUNTER — Encounter (HOSPITAL_COMMUNITY): Payer: Self-pay

## 2014-12-31 ENCOUNTER — Inpatient Hospital Stay (HOSPITAL_COMMUNITY): Payer: Medicare Other

## 2014-12-31 DIAGNOSIS — I70235 Atherosclerosis of native arteries of right leg with ulceration of other part of foot: Secondary | ICD-10-CM

## 2014-12-31 DIAGNOSIS — Z992 Dependence on renal dialysis: Secondary | ICD-10-CM

## 2014-12-31 DIAGNOSIS — E875 Hyperkalemia: Secondary | ICD-10-CM

## 2014-12-31 DIAGNOSIS — N186 End stage renal disease: Secondary | ICD-10-CM

## 2014-12-31 LAB — COMPREHENSIVE METABOLIC PANEL
ALBUMIN: 2.7 g/dL — AB (ref 3.5–5.2)
ALK PHOS: 65 U/L (ref 39–117)
ALT: 41 U/L (ref 0–53)
AST: 29 U/L (ref 0–37)
Anion gap: 18 — ABNORMAL HIGH (ref 5–15)
BILIRUBIN TOTAL: 0.5 mg/dL (ref 0.3–1.2)
BUN: 44 mg/dL — ABNORMAL HIGH (ref 6–23)
CALCIUM: 8.6 mg/dL (ref 8.4–10.5)
CO2: 22 mmol/L (ref 19–32)
CREATININE: 8.03 mg/dL — AB (ref 0.50–1.35)
Chloride: 98 mmol/L (ref 96–112)
GFR calc Af Amer: 6 mL/min — ABNORMAL LOW (ref >90)
GFR calc non Af Amer: 6 mL/min — ABNORMAL LOW (ref >90)
GLUCOSE: 143 mg/dL — AB (ref 70–99)
Potassium: 4.5 mmol/L (ref 3.5–5.1)
Sodium: 138 mmol/L (ref 135–145)
TOTAL PROTEIN: 5.9 g/dL — AB (ref 6.0–8.3)

## 2014-12-31 LAB — LACTIC ACID, PLASMA
LACTIC ACID, VENOUS: 1.4 mmol/L (ref 0.5–2.0)
LACTIC ACID, VENOUS: 2.2 mmol/L — AB (ref 0.5–2.0)

## 2014-12-31 LAB — CBC
HCT: 27.9 % — ABNORMAL LOW (ref 39.0–52.0)
Hemoglobin: 8.3 g/dL — ABNORMAL LOW (ref 13.0–17.0)
MCH: 29.2 pg (ref 26.0–34.0)
MCHC: 29.7 g/dL — ABNORMAL LOW (ref 30.0–36.0)
MCV: 98.2 fL (ref 78.0–100.0)
Platelets: 295 10*3/uL (ref 150–400)
RBC: 2.84 MIL/uL — ABNORMAL LOW (ref 4.22–5.81)
RDW: 19.3 % — ABNORMAL HIGH (ref 11.5–15.5)
WBC: 12.2 10*3/uL — ABNORMAL HIGH (ref 4.0–10.5)

## 2014-12-31 LAB — APTT: aPTT: 57 seconds — ABNORMAL HIGH (ref 24–37)

## 2014-12-31 LAB — PROTIME-INR
INR: 1.12 (ref 0.00–1.49)
Prothrombin Time: 14.5 seconds (ref 11.6–15.2)

## 2014-12-31 LAB — MRSA PCR SCREENING: MRSA BY PCR: NEGATIVE

## 2014-12-31 LAB — PHOSPHORUS: PHOSPHORUS: 6.7 mg/dL — AB (ref 2.3–4.6)

## 2014-12-31 LAB — PROCALCITONIN: Procalcitonin: 5.8 ng/mL

## 2014-12-31 MED ORDER — IOHEXOL 350 MG/ML SOLN
120.0000 mL | Freq: Once | INTRAVENOUS | Status: AC | PRN
  Administered 2014-12-31: 120 mL via INTRAVENOUS

## 2014-12-31 MED ORDER — VANCOMYCIN HCL 500 MG IV SOLR
500.0000 mg | Freq: Once | INTRAVENOUS | Status: AC
  Administered 2014-12-31: 500 mg via INTRAVENOUS
  Filled 2014-12-31: qty 500

## 2014-12-31 MED ORDER — DARBEPOETIN ALFA 100 MCG/0.5ML IJ SOSY
PREFILLED_SYRINGE | INTRAMUSCULAR | Status: AC
  Filled 2014-12-31: qty 0.5

## 2014-12-31 MED ORDER — VANCOMYCIN HCL IN DEXTROSE 750-5 MG/150ML-% IV SOLN
750.0000 mg | INTRAVENOUS | Status: DC
  Administered 2015-01-01 – 2015-01-03 (×2): 750 mg via INTRAVENOUS
  Filled 2014-12-31 (×2): qty 150

## 2014-12-31 MED ORDER — PIPERACILLIN-TAZOBACTAM IN DEX 2-0.25 GM/50ML IV SOLN
2.2500 g | Freq: Three times a day (TID) | INTRAVENOUS | Status: DC
  Administered 2014-12-31 – 2015-01-11 (×30): 2.25 g via INTRAVENOUS
  Filled 2014-12-31 (×40): qty 50

## 2014-12-31 MED ORDER — PREDNISONE 10 MG PO TABS
10.0000 mg | ORAL_TABLET | Freq: Every day | ORAL | Status: AC
  Administered 2015-01-02 – 2015-01-03 (×2): 10 mg via ORAL
  Filled 2014-12-31 (×2): qty 1

## 2014-12-31 MED ORDER — VANCOMYCIN HCL IN DEXTROSE 1-5 GM/200ML-% IV SOLN: 1000.0000 mg | Freq: Once | INTRAVENOUS | Status: DC

## 2014-12-31 MED ORDER — PIPERACILLIN-TAZOBACTAM 3.375 G IVPB 30 MIN: 3.3750 g | Freq: Once | INTRAVENOUS | Status: DC

## 2014-12-31 MED ORDER — DARBEPOETIN ALFA 100 MCG/0.5ML IJ SOSY
100.0000 ug | PREFILLED_SYRINGE | INTRAMUSCULAR | Status: DC
  Administered 2014-12-31 – 2015-01-07 (×2): 100 ug via INTRAVENOUS
  Filled 2014-12-31 (×3): qty 0.5

## 2014-12-31 MED ORDER — PREDNISONE 20 MG PO TABS
20.0000 mg | ORAL_TABLET | Freq: Every day | ORAL | Status: AC
  Administered 2014-12-31 – 2015-01-01 (×2): 20 mg via ORAL
  Filled 2014-12-31 (×4): qty 1

## 2014-12-31 NOTE — Consult Note (Signed)
[facility] [place]  Patient name: Randy [name] MRN: [id number] DOB: 1932-04-13 Sex: male  REASON FOR CONSULT: nonhealing wounds right foot. Consult from [facility]  HPI: Randy [name] is a 81 y.o. male who was admitted yesterday with right foot pain. He reportedly fell 2 weeks ago and injured the second third and fourth toes of his right foot. He's had increasing pain in the right foot since that time. He was admitted with cellulitis.   Prior to this injury, he admits to bilateral lower extremity calf claudication. His pain is brought on by ambulation and relieved with rest. He does not describe significant direct claudication. He also admits to rest pain in both feet.  His risk factors for peripheral vascular disease include hypertension and a prior history of tobacco use. He denies any history of diabetes, hypercholesterolemia, or history of premature cardiovascular disease.  I reviewed his heart cath which was performed on 08/17/2014. He had some coronary artery disease but it was felt at this time that the chest pain he had been having was related to pericarditis.  Past Medical History  Diagnosis Date  . Hypertensive heart disease   . Hypercholesterolemia   . Gout   . Secondary cardiomyopathy     LVEF 40-45%  . Anemia of chronic disease   . Hypertension   . Arthritis   . Peripheral vascular disease   . ESRD on peritoneal dialysis   . On home oxygen therapy     "use what I need to" (08/17/2014)  . Pneumonia 09/2012  . Pericarditis 08/17/2014  . NSTEMI (non-ST elevated myocardial infarction) 08/17/2014    Family History  Problem Relation Age of Onset  . Arthritis    . Cancer    . Kidney disease    . Anesthesia problems Neg Hx   . Hypotension Neg Hx   . Malignant hyperthermia Neg Hx   . Pseudochol deficiency Neg Hx   . Cancer Sister   . Cancer Brother     colon  . Cancer Brother     colon    SOCIAL HISTORY: History  Substance  Use Topics  . Smoking status: Former Smoker -- 1.00 packs/day for 30 years    Types: Cigarettes    Quit date: 08/19/1998  . Smokeless tobacco: Former Systems developer    Types: Chew    Quit date: 12/31/1993  . Alcohol Use: No    No Known Allergies  Current Facility-Administered Medications  Medication Dose Route Frequency Provider Last Rate Last Dose  . acetaminophen (TYLENOL) tablet 650 mg  650 mg Oral Q6H PRN Ivor Costa, MD       Or  . acetaminophen (TYLENOL) suppository 650 mg  650 mg Rectal Q6H PRN Ivor Costa, MD      . amLODipine (NORVASC) tablet 10 mg  10 mg Oral Daily Ivor Costa, MD   10 mg at 12/31/14 9892  . aspirin tablet 325 mg  325 mg Oral Daily Ivor Costa, MD   325 mg at 12/31/14 1194  . atorvastatin (LIPITOR) tablet 40 mg  40 mg Oral q1800 Ivor Costa, MD      . calcitRIOL (ROCALTROL) capsule 0.5 mcg  0.5 mcg Oral Daily Ivor Costa, MD   0.5 mcg at 12/31/14 0953  . carvedilol (COREG) tablet 25 mg  25 mg Oral BID WC Ivor Costa, MD   25 mg at 12/31/14 0826  . cloNIDine (CATAPRES) tablet 0.2 mg  0.2 mg Oral BID Ivor Costa, MD  0.2 mg at 12/31/14 0952  . colchicine tablet 0.6 mg  0.6 mg Oral Daily PRN Ivor Costa, MD      . cyclobenzaprine (FLEXERIL) tablet 5 mg  5 mg Oral TID PRN Ritta Slot, NP   5 mg at 12/31/14 0042  . Darbepoetin Alfa (ARANESP) injection 100 mcg  100 mcg Intravenous Q Sat-HD Fleet Contras, MD      . feeding supplement (RESOURCE BREEZE) (RESOURCE BREEZE) liquid 1 Container  1 Container Oral TID BM Ivor Costa, MD   1 Container at 12/31/14 [phone]  . heparin injection 5,000 Units  5,000 Units Subcutaneous 3 times per day Ivor Costa, MD   5,000 Units at 12/31/14 0626  . hydrALAZINE (APRESOLINE) tablet 50 mg  50 mg Oral TID Ivor Costa, MD   50 mg at 12/31/14 1093  . lisinopril (PRINIVIL,ZESTRIL) tablet 10 mg  10 mg Oral Daily Ivor Costa, MD   10 mg at 12/31/14 [phone]  . morphine 2 MG/ML injection 1 mg  1 mg Intravenous Q3H PRN Ivor Costa, MD      . morphine 2 MG/ML injection 1 mg  1 mg  Intravenous Q3H PRN Ivor Costa, MD   1 mg at 12/31/14 0301  . multivitamin (RENA-VIT) tablet 1 tablet  1 tablet Oral Daily Ivor Costa, MD   1 tablet at 12/31/14 [phone]  . ondansetron (ZOFRAN) tablet 4 mg  4 mg Oral Q6H PRN Ivor Costa, MD       Or  . ondansetron [facility]) injection 4 mg  4 mg Intravenous Q6H PRN Ivor Costa, MD      . oxyCODONE-acetaminophen (PERCOCET/ROXICET) 5-325 MG per tablet 1 tablet  1 tablet Oral Q4H PRN Ivor Costa, MD   1 tablet at 12/31/14 0430  . pantoprazole (PROTONIX) EC tablet 40 mg  40 mg Oral Daily Ivor Costa, MD   40 mg at 12/31/14 0952  . piperacillin-tazobactam (ZOSYN) IVPB 2.25 g  2.25 g Intravenous Q8H Wendee Beavers, RPH   2.25 g at 12/31/14 0254  . predniSONE (DELTASONE) tablet 20 mg  20 mg Oral Q breakfast Ivor Costa, MD   20 mg at 12/31/14 0826   Followed by  . [START ON 01/02/2015] predniSONE (DELTASONE) tablet 10 mg  10 mg Oral Q breakfast Ivor Costa, MD      . rOPINIRole (REQUIP) tablet 0.25-0.5 mg  0.25-0.5 mg Oral QHS Ivor Costa, MD   0.25 mg at 12/30/14 2248  . sevelamer carbonate (RENVELA) tablet 4,000 mg  4,000 mg Oral TID WC Ivor Costa, MD   4,000 mg at 12/31/14 0825  . sodium chloride 0.9 % injection 3 mL  3 mL Intravenous Q12H Ivor Costa, MD   3 mL at 12/31/14 0955    REVIEW OF SYSTEMS: Valu.Nieves ] denotes positive finding; [  ] denotes negative finding CARDIOVASCULAR:  Valu.Nieves ] chest pain   [ ]  chest pressure   [ ]  palpitations   Valu.Nieves ] orthopnea   [X]  dyspnea on exertion   Valu.Nieves ] claudication   [X]  rest pain   [ ]  DVT   [ ]  phlebitis PULMONARY:   [ ]  productive cough   [ ]  asthma   [ ]  wheezing NEUROLOGIC:   [ ]  weakness  [ ]  paresthesias  [ ]  aphasia  [ ]  amaurosis  Valu.Nieves ] dizziness HEMATOLOGIC:   [ ]  bleeding problems   [ ]  clotting disorders MUSCULOSKELETAL:  Valu.Nieves ] joint pain   [ ]  joint swelling [ ]  leg swelling  GASTROINTESTINAL: [ ]   blood in stool  [ ]   hematemesis GENITOURINARY:  [ ]   dysuria  [ ]   hematuria PSYCHIATRIC:  [ ]  history of major  depression INTEGUMENTARY:  [ ]  rashes  [ ]  ulcers CONSTITUTIONAL:  [ ]  fever   [ ]  chills  PHYSICAL EXAM: Filed Vitals:   12/30/14 2115 12/30/14 2147 12/31/14 0500 12/31/14 0951  BP: 154/88 162/89 129/72 148/71  Pulse: 100 109 83 90  Temp:  98.2 F (36.8 C) 98.2 F (36.8 C) 98.4 F (36.9 C)  TempSrc:  Oral Oral Oral  Resp: 14 16 16 18   Height:  5\' 9"  (1.753 m)    Weight:  157 lb 3 oz (71.3 kg)    SpO2: 98% 100% 98% 98%   Body mass index is 23.2 kg/(m^2). GENERAL: The patient is a well-nourished male, in no acute distress. The vital signs are documented above. CARDIOVASCULAR: There is a regular rate and rhythm. I do not detect carotid bruits. I cannot palpate femoral, popliteal, or pedal pulses bilaterally. On the left side he has a monophasic dorsalis pedis and posterior tibial signal. I cannot obtain Doppler signals in the right foot. The foot appears viable. PULMONARY: There is good air exchange bilaterally without wheezing or rales. ABDOMEN: Soft and non-tender with normal pitched bowel sounds.  MUSCULOSKELETAL: There are no major deformities or cyanosis. NEUROLOGIC: No focal weakness or paresthesias are detected. SKIN:There is some blistering on the dorsum of the foot. Currently there is no significant drainage with minimal cellulitis. PSYCHIATRIC: The patient has a normal affect.He has a wound on his third toe which appears full thickness.   DATA:  Lab Results  Component Value Date   WBC 12.2* 12/31/2014   HGB 8.3* 12/31/2014   HCT 27.9* 12/31/2014   MCV 98.2 12/31/2014   PLT 295 12/31/2014   Lab Results  Component Value Date   NA 138 12/31/2014   K 4.5 12/31/2014   CL 98 12/31/2014   CO2 22 12/31/2014   Lab Results  Component Value Date   CREATININE 8.03* 12/31/2014   Lab Results  Component Value Date   INR 1.12 12/30/2014   INR 1.04 09/24/2014   INR 1.16 08/17/2014   Lab Results  Component Value Date   HGBA1C 5.7* 06/21/2014   X-RAY RIGHT FOOT: There  is no acute abnormality noted.  The patient returned reportedly had an arterial Doppler study done in April that showed significant tibial artery occlusive disease however I am unable to locate that study.  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 He has multilevel arterial occlusive disease and it is unlikely that this wound will heal. Unfortunately, the patient is 81 years old and has end-stage renal disease, coronary artery disease, congestive heart failure, and is on home O2. Therefore, he would be at very high risk for major vascular surgery. I suspect that he has multilevel arterial occlusive disease and would require both an inflow procedure (axillobifemoral bypass) and an infrequent inguinal bypass if he had disease amenable to bypass. This would clearly be associated with significant risk. However I think it would be worth continuing his vascular workup at least to see what his options are. Given I cannot palpate femoral pulses all start with a CT angiogram . I will make further recommendations  pending these results. I did explain to the patient that clearly this is a limb threatening situation. He is at high  risk for limb loss.   [facility]  [place]: [phone]

## 2014-12-31 NOTE — Progress Notes (Signed)
CRITICAL VALUE ALERT  Critical value received:  Lactic acid of 2.2  Date of notification:  12/31/14  Time of notification:  00:17  Critical value read back:Yes.    Nurse who received alert:  Leandro Reasoner  MD notified (1st page):  Walden Field, NP  Time of first page:  00:25  MD notified (2nd page):  Time of second page:  Responding MD:  Overton Mam  Time MD responded:  00:25

## 2014-12-31 NOTE — Evaluation (Signed)
Physical Therapy Evaluation Patient Details Name: Randy [name] MRN: [id number] DOB: [birth year] Today's Date: 12/31/2014   History of Present Illness  Patient is an 81 yo male admitted 12/30/14 with Rt foot pain and cellulitis.  Patient had recent fall and injured Rt foot.  Now in process of vascular surgery work-up for peripheral vascular disease.  PMH:  HLD, HTN, ESRD now on HD, CHF, CAD, cardiomyopathy, NSTEMI    Clinical Impression  Patient presents with problems listed below.  Will benefit from acute PT to maximize functional independence and safety prior to return home with wife.    Follow Up Recommendations Home health PT;Supervision/Assistance - 24 hour    Equipment Recommendations  None recommended by PT    Recommendations for Other Services       Precautions / Restrictions Precautions Precautions: Fall Restrictions Weight Bearing Restrictions: No      Mobility  Bed Mobility Overal bed mobility: Needs Assistance Bed Mobility: Supine to Sit;Sit to Supine     Supine to sit: Min guard Sit to supine: Min guard   General bed mobility comments: No physical assist needed. Assist for safety.  Transfers Overall transfer level: Needs assistance Equipment used: Quad cane Transfers: Sit to/from Stand Sit to Stand: Min assist         General transfer comment: Verbal cues for hand placement.  Assist to steady during transition.  Ambulation/Gait Ambulation/Gait assistance: Min guard Ambulation Distance (Feet): 62 Feet Assistive device: Quad cane Gait Pattern/deviations: Step-through pattern;Decreased stance time - right;Decreased step length - left;Decreased stride length;Decreased weight shift to right;Steppage Gait velocity: Decreased Gait velocity interpretation: Below normal speed for age/gender General Gait Details: Patient demonstrates safe use of quad cane.  Slow, guarded gait pattern.  Slightly unsteady.  Steppage gait due to decreased sensation both  feet.  Stairs            Wheelchair Mobility    Modified Rankin (Stroke Patients Only)       Balance                                             Pertinent Vitals/Pain Pain Assessment: No/denies pain (Patient reports no pain in Rt foot today)    Home Living Family/patient expects to be discharged to:: Private residence Living Arrangements: Spouse/significant other Available Help at Discharge: Family;Available 24 hours/day Type of Home: House Home Access: Stairs to enter Entrance Stairs-Rails: None Entrance Stairs-Number of Steps: 2 Home Layout: One level Home Equipment: Walker - 2 wheels;Cane - quad;Shower seat;Wheelchair - manual      Prior Function Level of Independence: Independent with assistive device(s);Needs assistance   Gait / Transfers Assistance Needed: Patient used quad cane for ambulation  ADL's / Homemaking Assistance Needed: Assist for meal prep and housekeeping        Hand Dominance        Extremity/Trunk Assessment   Upper Extremity Assessment: Overall WFL for tasks assessed           Lower Extremity Assessment: Generalized weakness (Wound dressing on Rt foot; Bil feet "numb")      Cervical / Trunk Assessment: Kyphotic  Communication   Communication: No difficulties  Cognition Arousal/Alertness: Awake/alert Behavior During Therapy: WFL for tasks assessed/performed Overall Cognitive Status: Within Functional Limits for tasks assessed  General Comments      Exercises        Assessment/Plan    PT Assessment Patient needs continued PT services  PT Diagnosis Difficulty walking;Abnormality of gait;Generalized weakness   PT Problem List Decreased strength;Decreased activity tolerance;Decreased balance;Decreased mobility;Cardiopulmonary status limiting activity;Decreased skin integrity  PT Treatment Interventions DME instruction;Gait training;Functional mobility  training;Therapeutic activities;Patient/family education   PT Goals (Current goals can be found in the Care Plan section) Acute Rehab PT Goals Patient Stated Goal: To go home PT Goal Formulation: With patient Time For Goal Achievement: 01/07/15 Potential to Achieve Goals: Good    Frequency Min 3X/week   Barriers to discharge        Co-evaluation               End of Session Equipment Utilized During Treatment: Gait belt Activity Tolerance: Patient tolerated treatment well;Patient limited by fatigue Patient left: in bed;with call bell/phone within reach;with family/visitor present Nurse Communication: Mobility status         Time: 1439-1450 PT Time Calculation (min) (ACUTE ONLY): 11 min   Charges:   PT Evaluation $Initial PT Evaluation Tier I: 1 Procedure     PT G CodesDespina Pole 01-27-2015, 7:54 PM Carita Pian. Sanjuana Kava, [place] Pager [phone]

## 2014-12-31 NOTE — Progress Notes (Signed)
PROGRESS NOTE  Randy [name] DZH:[id number] DOB: [birth year] DOA: 01/19/2015 PCP: [name], MD  Assessment/Plan: Right foot pain:  -treated with Keflex due to suspected cellulitis,without improvement.  -foot is cool, indicating very poor circulation. - had arterial Seg signal doppler study on 12/27/14, which showed evidence of significant peripheral small vessel tibial disease. -IV abx -vascular consult   ESRD-HD and hyperkalemia: Potassium 5.4, no T-wave peaking. -Renal was consulted for dialysis.  Hypertension: -continue home medications: Amlodipine, clonidine, hydralazine, lisinopril, Coreg  Hyperlipidemia: -Lipitor  GERD: -Protonix  CAD: Currently no chest pain. EKG has no new change. -Continue aspirin and Lipitor  Code Status: full Family Communication: patient Disposition Plan:    Consultants:    Procedures:      HPI/Subjective: No SOB Foot feels numb  Objective: Filed Vitals:   12/31/14 0951  BP: 148/71  Pulse: 90  Temp: 98.4 F (36.9 C)  Resp: 18    Intake/Output Summary (Last 24 hours) at 12/31/14 1052 Last data filed at 12/31/14 0900  Gross per 24 hour  Intake    680 ml  Output      0 ml  Net    680 ml   Filed Weights   01/19/2015 1622 2015-01-19 2147  Weight: 72.576 kg (160 lb) 71.3 kg (157 lb 3 oz)    Exam:   General:  A+Ox3, NAD  Cardiovascular: rrr  Respiratory: clear  Abdomen: +BS, soft  Musculoskeletal: min edema in b/l leg   Data Reviewed: Basic Metabolic Panel:  Recent Labs Lab 12/27/14 1540 2015/01/19 1754 12/31/14 0432  NA 137 138 138  K 4.5 5.4* 4.5  CL 99 99 98  CO2 26 27 22   GLUCOSE 90 117* 143*  BUN 25* 42* 44*  CREATININE 5.52* 7.80* 8.03*  CALCIUM 8.9 8.8 8.6   Liver Function Tests:  Recent Labs Lab January 19, 2015 1754 12/31/14 0432  AST 48* 29  ALT 47 41  ALKPHOS 83 65  BILITOT 0.4 0.5  PROT 5.8* 5.9*  ALBUMIN 2.9* 2.7*   No results for input(s): LIPASE, AMYLASE in the last 168  hours. No results for input(s): AMMONIA in the last 168 hours. CBC:  Recent Labs Lab 12/27/14 1540 01-19-15 1754 12/31/14 0432  WBC 10.4 14.4* 12.2*  NEUTROABS 8.9* 12.8*  --   HGB 9.6* 8.3* 8.3*  HCT 30.7* 27.1* 27.9*  MCV 99.4 97.8 98.2  PLT 309 290 295   Cardiac Enzymes: No results for input(s): CKTOTAL, CKMB, CKMBINDEX, TROPONINI in the last 168 hours. BNP (last 3 results) No results for input(s): BNP in the last 8760 hours.  ProBNP (last 3 results)  Recent Labs  06/21/14 1046 08/17/14 0625 08/29/14 0928  PROBNP >70000.0* >70000.0* >70000.0*    CBG: No results for input(s): GLUCAP in the last 168 hours.  Recent Results (from the past 240 hour(s))  MRSA PCR Screening     Status: None   Collection Time: 12/31/14  1:11 AM  Result Value Ref Range Status   MRSA by PCR NEGATIVE NEGATIVE Final    Comment:        The GeneXpert MRSA Assay (FDA approved for NASAL specimens only), is one component of a comprehensive MRSA colonization surveillance program. It is not intended to diagnose MRSA infection nor to guide or monitor treatment for MRSA infections.      Studies: Dg Foot Complete Right  01-19-2015   CLINICAL DATA:  Fall from bed 3 weeks ago. Trauma to right foot, hitting a a on a dresser/night standard. Increased  swelling and throbbing of the right foot since then. The patient states the foot has been getting colder and feeling numb. He denies diabetes.  EXAM: RIGHT FOOT COMPLETE - 3+ VIEW  COMPARISON:  Right foot radiographs 12/26/2014.  FINDINGS: Mild osteopenia is again noted. No acute bone or soft tissue abnormality is present. Microvascular calcifications are present.  IMPRESSION: 1. No acute abnormality or significant interval change. 2. Mild osteopenia.   Electronically Signed   By: San Morelle M.D.   On: 12/30/2014 19:04    Scheduled Meds: . amLODipine  10 mg Oral Daily  . aspirin  325 mg Oral Daily  . atorvastatin  40 mg Oral q1800  .  calcitRIOL  0.5 mcg Oral Daily  . carvedilol  25 mg Oral BID WC  . cloNIDine  0.2 mg Oral BID  . darbepoetin (ARANESP) injection - DIALYSIS  100 mcg Intravenous Q Sat-HD  . feeding supplement (RESOURCE BREEZE)  1 Container Oral TID BM  . heparin  5,000 Units Subcutaneous 3 times per day  . hydrALAZINE  50 mg Oral TID  . lisinopril  10 mg Oral Daily  . multivitamin  1 tablet Oral Daily  . pantoprazole  40 mg Oral Daily  . piperacillin-tazobactam (ZOSYN)  IV  2.25 g Intravenous Q8H  . predniSONE  20 mg Oral Q breakfast   Followed by  . [START ON 01/02/2015] predniSONE  10 mg Oral Q breakfast  . rOPINIRole  0.25-0.5 mg Oral QHS  . sevelamer carbonate  4,000 mg Oral TID WC  . sodium chloride  3 mL Intravenous Q12H   Continuous Infusions:  Antibiotics Given (last 72 hours)    Date/Time Action Medication Dose Rate   12/31/14 0424 Given   vancomycin (VANCOCIN) 500 mg in sodium chloride 0.9 % 100 mL IVPB 500 mg 100 mL/hr   12/31/14 0626 Given   piperacillin-tazobactam (ZOSYN) IVPB 2.25 g 2.25 g 100 mL/hr      Principal Problem:   Foot pain, right Active Problems:   HLD (hyperlipidemia)   Polycystic kidney   Lower GI bleed   ESRD (end stage renal disease) on dialysis   Foot pain   CHF (congestive heart failure)   CAD (coronary artery disease)   Hyperkalemia    Time spent: 25 min    Deema Juncaj  [facility] Pager [phone] If 7PM-7AM, please contact night-coverage at [email or web address], password [facility] 12/31/2014, 10:52 AM  LOS: 1 day

## 2014-12-31 NOTE — Progress Notes (Addendum)
ANTIBIOTIC CONSULT NOTE - INITIAL -restart of vancomycin and zosyn  Pharmacy Consult for Vancomycin and zosyn Indication: Cellulitis   No Known Allergies  Patient Measurements: Height: 5\' 9"  (175.3 cm) Weight: 157 lb 3 oz (71.3 kg) IBW/kg (Calculated) : 70.7  Vital Signs: Temp: 98.2 F (36.8 C) (04/08 2147) Temp Source: Oral (04/08 2147) BP: 162/89 mmHg (04/08 2147) Pulse Rate: 109 (04/08 2147) Intake/Output from previous day: 04/08 0701 - 04/09 0700 In: 240 [P.O.:240] Out: -  Intake/Output from this shift: Total I/O In: 240 [P.O.:240] Out: -   Labs:  Recent Labs  12/30/14 1754  WBC 14.4*  HGB 8.3*  PLT 290  CREATININE 7.80*   Estimated Creatinine Clearance: 7.4 mL/min (by C-G formula based on Cr of 7.8). No results for input(s): VANCOTROUGH, VANCOPEAK, VANCORANDOM, GENTTROUGH, GENTPEAK, GENTRANDOM, TOBRATROUGH, TOBRAPEAK, TOBRARND, AMIKACINPEAK, AMIKACINTROU, AMIKACIN in the last 72 hours.   Microbiology: No results found for this or any previous visit (from the past 720 hour(s)).  Medical History: Past Medical History  Diagnosis Date  . Hypertensive heart disease   . Hypercholesterolemia   . Gout   . Secondary cardiomyopathy     LVEF 40-45%  . Anemia of chronic disease   . Hypertension   . Arthritis   . Peripheral vascular disease   . ESRD on peritoneal dialysis   . On home oxygen therapy     "use what I need to" (08/17/2014)  . Pneumonia 09/2012  . Pericarditis 08/17/2014  . NSTEMI (non-ST elevated myocardial infarction) 08/17/2014   Assessment: 63 YOM with hx of ESRD- usually on PD (peritoneal dialysis but has received hemodialysis x 3 this week) presented to the ED with right foot pain and redness, failed outpatient treatment with PO keflex. Pharmacy is consulted to start vancomycin and zosyn. Afebrile, WBC elevated at 14.4. One dose of vancomycin 1g was already given at [facility] on 12/30/14.   MD discontinued the vancomycin and zosyn after there 1st  doses but now Dr. Donna Bernard wants to restart both per pharmacy consults.   Usually on PD (peritoneal dialysis) but has received hemodialysis x 3 this week, last HD done 12/29/14.   Zosyn 4/8 >> Vancomycin 4/8 >>  4/8 Blood cx x 2 -  Goal of Therapy:  Vancomycin trough level 10-20 mcg/ml  Plan:  - Restart Zosyn 2.25 g IV Q 8 hrs - Give another 500 mg vancomycin IV for total of 1.5g load - Check vancomycin level in 3-4 days and re-dose if level < 20 mcg/ml. - F/u plans for dialysis and adjust dosing schedule as needed.   Nicole Cella, RPh Clinical Pharmacist Pager: [phone]  12/31/2014,2:57 AM  _____________________________________________ Patient on [facility] HD schedule outpatient. Plan for HD today.   Plan: Vanc 750mg  IV qHD session Continue zosyn 2.25g IV q8h F/u cultures, clinical progress, and HD schedule  Thank you for allowing pharmacy to be part of this patient's care team  Hol[place], Pharm.D Clinical Pharmacy Resident Pager: [phone] 12/31/2014 .1:41 PM

## 2014-12-31 NOTE — Progress Notes (Signed)
Patient placed on Peritoneal dialysis (PD) per order. Informed consent signed by patient before PD. Patient tolerated procedure, no complaints received.

## 2014-12-31 NOTE — Progress Notes (Signed)
New Admission Note:  Arrival Method: via stretcher  Mental Orientation: Alert and orientedx4 Telemetry: Placed on box 23, CCMD notified Assessment: Completed Skin: abrasions (from fall, per patient) on 2nd, 3rd, 4th and 5th toes on right foot, dark blister on right great toe, scabs on back IV: Right forearm, saline locked Pain: see MAR Safety Measures: Safety Fall Prevention Plan was given, discussed and signed. Admission: initiated [place] Orientation: Patient has been orientated to the room, unit and the staff. Family: Daughter at bedside  Orders have been reviewed and implemented. Will continue to monitor the patient. Call light has been placed within reach and bed alarm has been activated.   Leandro Reasoner BSN, RN  Phone Number: [phone] [facility]

## 2014-12-31 NOTE — Procedures (Signed)
I was present at this dialysis session, have reviewed the session itself and made  appropriate changes  Cramped twice on dialysis, UF goal reduced accordingly .  Stable otherwise.   Kelly Splinter MD (pgr) [phone]    [phone] 12/31/2014, 7:04 PM

## 2014-12-31 NOTE — Consult Note (Signed)
Randy [name] is an 81 y.o. male referred by Dr Eliseo Squires   Chief Complaint: ESRD, anemia, sec HPTH HPI: 81yo male with ESRD admitted last night for LE cellulitis.  Pt followed by Dr Hinda Lenis at [facility] unit had been on PD but recently switched to HD due to under dialysis.   Has had issues with cellulitis and ischemic looking Rt 2nd toe for "awhile".  Overall not a very good historian.  Past Medical History  Diagnosis Date  . Hypertensive heart disease   . Hypercholesterolemia   . Gout   . Secondary cardiomyopathy     LVEF 40-45%  . Anemia of chronic disease   . Hypertension   . Arthritis   . Peripheral vascular disease   . ESRD on peritoneal dialysis   . On home oxygen therapy     "use what I need to" (08/17/2014)  . Pneumonia 09/2012  . Pericarditis 08/17/2014  . NSTEMI (non-ST elevated myocardial infarction) 08/17/2014    Past Surgical History  Procedure Laterality Date  . Knee arthroscopy Right 2007  . Back surgery    . Hemorrhoid surgery  1970's  . Ganglion cyst excision  01/03/2012    Procedure: REMOVAL GANGLION OF WRIST;  Surgeon: Scherry Ran, MD;  Location: [facility];  Service: [name];  Laterality: Right;  . Colonoscopy    . Av fistula placement  08/24/2012    Procedure: ARTERIOVENOUS (AV) FISTULA CREATION;  Surgeon: Rosetta Posner, MD;  Location: [facility];  Service: Vascular;  Laterality: Left;  . Insertion of dialysis catheter Right      neck  . Insertion of dialysis catheter  10/19/2012    Procedure: INSERTION OF DIALYSIS CATHETER;  Surgeon: Rosetta Posner, MD;  Location: [facility];  Service: Vascular;  Laterality: N/A;  REMOVE TEMPORARY CATH  . Cardiac catheterization  08/17/2014  . Cholecystectomy    . Lumbar disc surgery  2004  . Left heart catheterization with coronary angiogram N/A 08/17/2014    Procedure: LEFT HEART CATHETERIZATION WITH CORONARY ANGIOGRAM;  Surgeon: Larey Dresser, MD;  Location[facility] CATH LAB;  Service: Cardiovascular;  Laterality: N/A;  .  Colonoscopy Left 09/26/2014    Procedure: COLONOSCOPY;  Surgeon: Arta Silence, MD;  Location: [facility] ENDOSCOPY;  Service: Endoscopy;  Laterality: Left;    Family History  Problem Relation Age of Onset  . Arthritis    . Cancer    . Kidney disease    . Anesthesia problems Neg Hx   . Hypotension Neg Hx   . Malignant hyperthermia Neg Hx   . Pseudochol deficiency Neg Hx   . Cancer Sister   . Cancer Brother     colon  . Cancer Brother     colon  No FH renal ds  Social History:  reports that he quit smoking about 16 years ago. His smoking use included Cigarettes. He has a 30 pack-year smoking history. He quit smokeless tobacco use about 21 years ago. His smokeless tobacco use included Chew. He reports that he does not drink alcohol or use illicit drugs. Lives with wife in [place]  Allergies: No Known Allergies  Medications Prior to Admission  Medication Sig Dispense Refill  . amLODipine (NORVASC) 10 MG tablet Take 1 tablet (10 mg total) by mouth daily. 30 tablet 3  . aspirin 325 MG tablet Take 1 tablet (325 mg total) by mouth 2 (two) times daily. (Patient taking differently: Take 325 mg by mouth daily. )    . calcitRIOL (ROCALTROL) 0.5  MCG capsule Take 1 capsule (0.5 mcg total) by mouth daily. 30 capsule 3  . carvedilol (COREG) 25 MG tablet Take 1 tablet (25 mg total) by mouth 2 (two) times daily with a meal. 60 tablet 3  . cephALEXin (KEFLEX) 250 MG capsule Take 1 capsule by mouth 4 (four) times daily. Starting 12/26/2014 x 7 days.  0  . cloNIDine (CATAPRES) 0.1 MG tablet Take 1 tablet (0.1 mg total) by mouth 2 (two) times daily. (Patient not taking: Reported on 12/27/2014) 60 tablet 3  . cloNIDine (CATAPRES) 0.2 MG tablet Take 1 tablet by mouth 2 (two) times daily.  10  . COLCRYS 0.6 MG tablet Take 1 tablet by mouth daily as needed (gout flare up).   2  . docusate sodium 100 MG CAPS Take 100 mg by mouth 2 (two) times daily. (Patient not taking: Reported on 12/27/2014) 10 capsule 0  . feeding  supplement, RESOURCE BREEZE, (RESOURCE BREEZE) LIQD Take 1 Container by mouth 3 (three) times daily between meals. 90 Container 5  . hydrALAZINE (APRESOLINE) 25 MG tablet Take 1 tablet (25 mg total) by mouth 3 (three) times daily. (Patient not taking: Reported on 12/27/2014) 90 tablet 3  . hydrALAZINE (APRESOLINE) 50 MG tablet Take 1 tablet by mouth 3 (three) times daily.  10  . labetalol (NORMODYNE) 200 MG tablet Take 2 tablets (400 mg total) by mouth 2 (two) times daily. (Patient taking differently: Take 200 mg by mouth at bedtime. ) 120 tablet 6  . lisinopril (PRINIVIL,ZESTRIL) 10 MG tablet Take 1 tablet (10 mg total) by mouth daily. 30 tablet 3  . multivitamin (RENA-VIT) TABS tablet Take 1 tablet by mouth daily.    Marland Kitchen oxyCODONE-acetaminophen (PERCOCET/ROXICET) 5-325 MG per tablet Take 1 tablet by mouth every 6 (six) hours as needed for severe pain. 15 tablet 0  . pantoprazole (PROTONIX) 40 MG tablet Take 1 tablet (40 mg total) by mouth daily at 12 noon. (Patient taking differently: Take 40 mg by mouth daily as needed (acid reflux). ) 30 tablet 11  . polyethylene glycol (MIRALAX / GLYCOLAX) packet Take 17 g by mouth daily. (Patient not taking: Reported on 12/27/2014) 30 each 0  . potassium chloride (K-DUR,KLOR-CON) 10 MEQ tablet Take 1 tablet by mouth daily.  10  . predniSONE (STERAPRED UNI-PAK) 10 MG tablet Take 1-6 tablets by mouth See admin instructions. Take 6 tabs x 2 days, 5 tabs x 2 days, 4 tabs x 2 days, 3 tabs x 2 days, 2 tabs x 2 days and then 1 tab x 2 days.  0  . rOPINIRole (REQUIP) 0.25 MG tablet Take 0.25-0.5 mg by mouth at bedtime. Takes depending on bad restless legs are.    . sevelamer carbonate (RENVELA) 800 MG tablet Take 2,400-4,000 mg by mouth 3 (three) times daily with meals. To take 5 with meals and 2 with snacks    . simvastatin (ZOCOR) 40 MG tablet Take 1 tablet (40 mg total) by mouth every Monday, Wednesday, and Friday. (Patient not taking: Reported on 12/27/2014) 30 tablet 11  .  simvastatin (ZOCOR) 80 MG tablet Take 1 tablet by mouth daily.  10     Lab Results: UA: ND   Recent Labs  12/30/14 1754 12/31/14 0432  WBC 14.4* 12.2*  HGB 8.3* 8.3*  HCT 27.1* 27.9*  PLT 290 295   BMET  Recent Labs  12/30/14 1754 12/31/14 0432  NA 138 138  K 5.4* 4.5  CL 99 98  CO2 27 22  GLUCOSE  117* 143*  BUN 42* 44*  CREATININE 7.80* 8.03*  CALCIUM 8.8 8.6   LFT  Recent Labs  12/31/14 0432  PROT 5.9*  ALBUMIN 2.7*  AST 29  ALT 41  ALKPHOS 65  BILITOT 0.5   Dg Foot Complete Right  12/30/2014   CLINICAL DATA:  Fall from bed 3 weeks ago. Trauma to right foot, hitting a a on a dresser/night standard. Increased swelling and throbbing of the right foot since then. The patient states the foot has been getting colder and feeling numb. He denies diabetes.  EXAM: RIGHT FOOT COMPLETE - 3+ VIEW  COMPARISON:  Right foot radiographs 12/26/2014.  FINDINGS: Mild osteopenia is again noted. No acute bone or soft tissue abnormality is present. Microvascular calcifications are present.  IMPRESSION: 1. No acute abnormality or significant interval change. 2. Mild osteopenia.   Electronically Signed   By: San Morelle M.D.   On: 12/30/2014 19:04    ROS: No Change in vision No SOB  No CP No Abd pain CO pain Rt foot + numbness/tingling of both feet No dysuria   PHYSICAL EXAM: Blood pressure 148/71, pulse 90, temperature 98.4 F (36.9 C), temperature source Oral, resp. rate 18, height 5\' 9"  (1.753 m), weight 71.3 kg (157 lb 3 oz), SpO2 98 %. HEENT: PERRLA EOMI NECK:No JVD LUNGS:Few faint basilar crackles CARDIAC:RRR wo MRG ABD:+ BS NTND No hsm  PD cath site clean and dry EXT:tr edema.  Erythema of Rt 2nd,3rd,4th toe with 2nd toe looking ischemic NEURO:CNI Ox3 No asterixis    [facility]:  TTS 3hr 50min, BFR 350, 15g, DW 70.5kg, Epo 10,000u, Lt AVF  Assessment: 1. ESRD Now on HD TTS 2. Anemia. Recent iron levels at his unit were OK 3. Sec HPTH.  Sensipar  listed on his med list but he does not recognize the name and ? If taking it 4. Cellulitis rt foot 5. HTN PLAN: 1. HD today 2. Resume ESA 3. Cont calcitriol for now.  Unclear if he is to go back to PD 4. IV AB 5.  renal meds reviewed   Jala Dundon T 12/31/2014, 10:10 AM

## 2015-01-01 ENCOUNTER — Inpatient Hospital Stay (HOSPITAL_COMMUNITY): Payer: Medicare Other

## 2015-01-01 DIAGNOSIS — N2889 Other specified disorders of kidney and ureter: Secondary | ICD-10-CM

## 2015-01-01 MED ORDER — WHITE PETROLATUM GEL
Status: AC
Start: 2015-01-01 — End: 2015-01-01
  Administered 2015-01-01: 0.2
  Filled 2015-01-01: qty 1

## 2015-01-01 MED ORDER — VANCOMYCIN HCL IN DEXTROSE 750-5 MG/150ML-% IV SOLN
750.0000 mg | Freq: Once | INTRAVENOUS | Status: AC
  Administered 2015-01-01: 750 mg via INTRAVENOUS
  Filled 2015-01-01: qty 150

## 2015-01-01 MED ORDER — WHITE PETROLATUM GEL
Status: AC
  Filled 2015-01-01: qty 1

## 2015-01-01 NOTE — Progress Notes (Signed)
ANTIBIOTIC CONSULT NOTE - FOLLOW UP  Pharmacy Consult for Vanc/Zosyn Indication: cellulitis - failed outpatient abx  No Known Allergies  Patient Measurements: Height: 5\' 9"  (175.3 cm) Weight: 158 lb 11.7 oz (72 kg) IBW/kg (Calculated) : 70.7  Vital Signs: Temp: 98.1 F (36.7 C) (04/10 0938) Temp Source: Oral (04/10 0938) BP: 123/57 mmHg (04/10 0938) Pulse Rate: 88 (04/10 0938) Intake/Output from previous day: 04/09 0701 - 04/10 0700 In: 680 [P.O.:680] Out: 2500  Intake/Output from this shift: Total I/O In: 240 [P.O.:240] Out: -   Labs:  Recent Labs  12/30/14 1754 12/31/14 0432  WBC 14.4* 12.2*  HGB 8.3* 8.3*  PLT 290 295  CREATININE 7.80* 8.03*   Estimated Creatinine Clearance: 7.2 mL/min (by C-G formula based on Cr of 8.03). No results for input(s): VANCOTROUGH, VANCOPEAK, VANCORANDOM, GENTTROUGH, GENTPEAK, GENTRANDOM, TOBRATROUGH, TOBRAPEAK, TOBRARND, AMIKACINPEAK, AMIKACINTROU, AMIKACIN in the last 72 hours.   Microbiology: Recent Results (from the past 720 hour(s))  Blood culture (routine x 2)     Status: None (Preliminary result)   Collection Time: 12/30/14  5:54 PM  Result Value Ref Range Status   Specimen Description BLOOD RIGHT FOREARM  Final   Special Requests BOTTLES DRAWN AEROBIC AND ANAEROBIC 10CC  Final   Culture   Final           BLOOD CULTURE RECEIVED NO GROWTH TO DATE CULTURE WILL BE HELD FOR 5 DAYS BEFORE ISSUING A FINAL NEGATIVE REPORT Performed at [facility]    Report Status PENDING  Incomplete  Blood culture (routine x 2)     Status: None (Preliminary result)   Collection Time: 12/30/14  6:06 PM  Result Value Ref Range Status   Specimen Description BLOOD HAND RIGHT  Final   Special Requests BOTTLES DRAWN AEROBIC AND ANAEROBIC 5CC  Final   Culture   Final           BLOOD CULTURE RECEIVED NO GROWTH TO DATE CULTURE WILL BE HELD FOR 5 DAYS BEFORE ISSUING A FINAL NEGATIVE REPORT Performed at [facility]    Report Status  PENDING  Incomplete  MRSA PCR Screening     Status: None   Collection Time: 12/31/14  1:11 AM  Result Value Ref Range Status   MRSA by PCR NEGATIVE NEGATIVE Final    Comment:        The GeneXpert MRSA Assay (FDA approved for NASAL specimens only), is one component of a comprehensive MRSA colonization surveillance program. It is not intended to diagnose MRSA infection nor to guide or monitor treatment for MRSA infections.     Anti-infectives    Start     Dose/Rate Route Frequency Ordered Stop   12/31/14 1345  vancomycin (VANCOCIN) IVPB 750 mg/150 ml premix     750 mg 150 mL/hr over 60 Minutes Intravenous Every T-Th-Sa (Hemodialysis) 12/31/14 1343     12/31/14 0600  piperacillin-tazobactam (ZOSYN) IVPB 2.25 g     2.25 g 100 mL/hr over 30 Minutes Intravenous Every 8 hours 12/31/14 0240     12/31/14 0330  vancomycin (VANCOCIN) 500 mg in sodium chloride 0.9 % 100 mL IVPB     500 mg 100 mL/hr over 60 Minutes Intravenous  Once 12/31/14 0249 12/31/14 0524   12/31/14 0245  piperacillin-tazobactam (ZOSYN) IVPB 3.375 g  Status:  Discontinued     3.375 g 100 mL/hr over 30 Minutes Intravenous  Once 12/31/14 0235 12/31/14 0257   12/31/14 0245  vancomycin (VANCOCIN) IVPB 1000 mg/200 mL premix  Status:  Discontinued     1,000 mg 200 mL/hr over 60 Minutes Intravenous  Once 12/31/14 0235 12/31/14 0249   12/30/14 2030  vancomycin (VANCOCIN) IVPB 1000 mg/200 mL premix  Status:  Discontinued     1,000 mg 200 mL/hr over 60 Minutes Intravenous  Once 12/30/14 2020 12/30/14 2238   12/30/14 2000  piperacillin-tazobactam (ZOSYN) IVPB 2.25 g  Status:  Discontinued     2.25 g 100 mL/hr over 30 Minutes Intravenous Every 8 hours 12/30/14 1920 12/30/14 2153   12/30/14 1915  vancomycin (VANCOCIN) IVPB 1000 mg/200 mL premix     1,000 mg 200 mL/hr over 60 Minutes Intravenous  Once 12/30/14 1911 12/30/14 2041      Assessment: 83 YOM with hx of ESRD- usually on PD (peritoneal dialysis but has received  hemodialysis x 3 this week) presented to the ED with right foot pain and redness, failed outpatient treatment with PO keflex. Pharmacy is consulted to start vancomycin and zosyn. Abx D#2, recived 1500mg  vanc load, and was scheduled to get maintenance dose with HD yesterday, that dose was not given. Reason unknown. WBC 12.2, afebrile. Tolerated HD session 4/9, BFR 350 x 3.5hrs.  Goal of Therapy:  Pre-HD Vanc Level 15-68mcg/mL  Plan:  Give Vanc 750mg  IV x 1 today Vanc 750mg  IV qTThS with HD Continue Zosyn 2.25g IV q8h F/u vascular surgery plans F/u LOT and de-escalation  Thank you for allowing pharmacy to be part of this patient's care team  Horse [place], Pharm.D Clinical Pharmacy Resident Pager: [phone] 01/01/2015 .10:42 AM

## 2015-01-01 NOTE — Progress Notes (Signed)
PROGRESS NOTE  Randy SIVERSON IEP:[id number] DOB: 09/04/1932 DOA: 12/30/2014 PCP: [name], MD  Assessment/Plan: Right foot pain:  -treated with Keflex due to suspected cellulitis,without improvement.  -foot is cool, indicating very poor circulation. - had arterial Seg signal doppler study on 12/27/14, which showed evidence of significant peripheral small vessel tibial disease. -IV abx -vascular consult: CT scan shows multilevel arterial occlusive disease. do not think that he has adequate circulation to heal this wound on the right foot. His only option for revascularization would be axillobifemoral bypass and a right infrainguinal bypass. His only runoff is the peroneal artery. Thus this procedure would be associated with significant risk, given his multiple comorbidities, yet he would still be at significant risk for limb loss. would favor primary right above-the-knee amputation if the wounds in the right foot progress and he is agreeable.  Kidney mass:  incidental finding on his CT scan was a 2 cm enhancing mass in the midpole of the left kidney concerning for possible neoplasm or malignancy. Further evaluation with MRI was recommended  ESRD-HD with hyperkalemia at admission: renal for HD  Hypertension: -continue home medications: Amlodipine, clonidine, hydralazine, lisinopril, Coreg  Hyperlipidemia: -Lipitor  GERD: -Protonix  CAD: Currently no chest pain. EKG has no new change. -Continue aspirin and Lipitor  Code Status: full Family Communication: patient Disposition Plan:    Consultants:    Procedures:      HPI/Subjective: Still with pain in foot  Objective: Filed Vitals:   01/01/15 0511  BP: 125/59  Pulse: 90  Temp: 98.1 F (36.7 C)  Resp: 16    Intake/Output Summary (Last 24 hours) at 01/01/15 0925 Last data filed at 01/01/15 0602  Gross per 24 hour  Intake    440 ml  Output   2500 ml  Net  -2060 ml   Filed Weights   12/31/14 1533 12/31/14  1929 01/01/15 0500  Weight: 72.6 kg (160 lb 0.9 oz) 70.3 kg (154 lb 15.7 oz) 72 kg (158 lb 11.7 oz)    Exam:   General:  NAD, able to relay what was told to him by vascular  Cardiovascular: rrr  Respiratory: clear  Abdomen: +BS, soft  Musculoskeletal: min edema in b/l leg   Data Reviewed: Basic Metabolic Panel:  Recent Labs Lab 12/27/14 1540 12/30/14 1754 12/31/14 0432 12/31/14 1210  NA 137 138 138  --   K 4.5 5.4* 4.5  --   CL 99 99 98  --   CO2 26 27 22   --   GLUCOSE 90 117* 143*  --   BUN 25* 42* 44*  --   CREATININE 5.52* 7.80* 8.03*  --   CALCIUM 8.9 8.8 8.6  --   PHOS  --   --   --  6.7*   Liver Function Tests:  Recent Labs Lab 12/30/14 1754 12/31/14 0432  AST 48* 29  ALT 47 41  ALKPHOS 83 65  BILITOT 0.4 0.5  PROT 5.8* 5.9*  ALBUMIN 2.9* 2.7*   No results for input(s): LIPASE, AMYLASE in the last 168 hours. No results for input(s): AMMONIA in the last 168 hours. CBC:  Recent Labs Lab 12/27/14 1540 12/30/14 1754 12/31/14 0432  WBC 10.4 14.4* 12.2*  NEUTROABS 8.9* 12.8*  --   HGB 9.6* 8.3* 8.3*  HCT 30.7* 27.1* 27.9*  MCV 99.4 97.8 98.2  PLT 309 290 295   Cardiac Enzymes: No results for input(s): CKTOTAL, CKMB, CKMBINDEX, TROPONINI in the last 168 hours. BNP (last 3 results)  No results for input(s): BNP in the last 8760 hours.  ProBNP (last 3 results)  Recent Labs  06/21/14 1046 08/17/14 0625 08/29/14 0928  PROBNP >70000.0* >70000.0* >70000.0*    CBG: No results for input(s): GLUCAP in the last 168 hours.  Recent Results (from the past 240 hour(s))  Blood culture (routine x 2)     Status: None (Preliminary result)   Collection Time: 12/30/14  5:54 PM  Result Value Ref Range Status   Specimen Description BLOOD RIGHT FOREARM  Final   Special Requests BOTTLES DRAWN AEROBIC AND ANAEROBIC 10CC  Final   Culture   Final           BLOOD CULTURE RECEIVED NO GROWTH TO DATE CULTURE WILL BE HELD FOR 5 DAYS BEFORE ISSUING A FINAL  NEGATIVE REPORT Performed at [facility]    Report Status PENDING  Incomplete  Blood culture (routine x 2)     Status: None (Preliminary result)   Collection Time: 12/30/14  6:06 PM  Result Value Ref Range Status   Specimen Description BLOOD HAND RIGHT  Final   Special Requests BOTTLES DRAWN AEROBIC AND ANAEROBIC 5CC  Final   Culture   Final           BLOOD CULTURE RECEIVED NO GROWTH TO DATE CULTURE WILL BE HELD FOR 5 DAYS BEFORE ISSUING A FINAL NEGATIVE REPORT Performed at [facility]    Report Status PENDING  Incomplete  MRSA PCR Screening     Status: None   Collection Time: 12/31/14  1:11 AM  Result Value Ref Range Status   MRSA by PCR NEGATIVE NEGATIVE Final    Comment:        The GeneXpert MRSA Assay (FDA approved for NASAL specimens only), is one component of a comprehensive MRSA colonization surveillance program. It is not intended to diagnose MRSA infection nor to guide or monitor treatment for MRSA infections.      Studies: Ct Angio Ao+bifem W/cm &/or Wo/cm  12/31/2014   CLINICAL DATA:  Gangrene left foot.  EXAM: CT ANGIOGRAPHY OF ABDOMINAL AORTA WITH ILIOFEMORAL RUNOFF  TECHNIQUE: Multidetector CT imaging of the abdomen, pelvis and lower extremities was performed using the standard protocol during bolus administration of intravenous contrast. Multiplanar CT image reconstructions and MIPs were obtained to evaluate the vascular anatomy.  CONTRAST:  183mL OMNIPAQUE IOHEXOL 350 MG/ML SOLN  COMPARISON:  None.  FINDINGS: Aorta: Atherosclerotic calcifications are seen involving the abdominal aorta and iliac arteries. No aneurysm or dissection is noted. Left gastric artery has separate origin and is widely patent. Remaining celiac artery is widely patent. Mild calcified stenosis is noted at the origin of the superior mesenteric artery. Severe stenosis is noted at the origin of the inferior mesenteric artery. Heavily calcified stenoses are noted at the origins of  both renal arteries. No significant stenoses are noted in the abdominal aorta or iliac arteries.  Right Lower Extremity: Right common and profunda femoral arteries are heavily calcified but patent. However, there is complete occlusion of the proximal right superficial femoral artery with collateral reconstitution of the popliteal artery which is also heavily calcified with multiple stenoses. All 3 trifurcation vessels are heavily calcified. Only the peroneal artery demonstrates definite contrast enhancement. Presumably, the anterior and posterior tibial arteries are occluded.  Left Lower Extremity: Left common and profunda femoral arteries are heavily calcified but patent. There is complete occlusion of the proximal left superficial femoral artery. Collaterals are seen leading to reconstitution of the distal left superficial  femoral artery popliteal artery, both of which are heavily calcified with multiple stenoses. The trifurcation vessels are heavily calcified as well with only definite opacification seen within the peroneal branch. Presumably, the anterior and posterior tibial arteries are occluded.  Mild left pleural effusion is noted with adjacent subsegmental atelectasis. 2 cm rounded enhancing mass is noted in the midpole of the left kidney concerning for possible neoplasm or malignancy. There is no evidence of bowel obstruction. Peritoneal dialysis catheter is noted in the pelvis. Expected amount of free fluid is noted.  Review of the MIP images confirms the above findings.  IMPRESSION: Severe diffuse atherosclerotic calcifications are seen throughout all arteries in the abdomen, pelvis and lower extremities. There is no evidence of abdominal aortic aneurysm or dissection.  Probable severe calcified stenoses are seen involving both renal arteries.  Both superficial femoral arteries are occluded proximally, with reconstitution of the more distal portions and popliteal arteries through collaterals. There  appears to be 1 vessel runoff in each calf through patent peroneal arteries.  2 cm enhancing mass is noted in the mid pole of left kidney concerning for possible neoplasm or malignancy. Further evaluation with MRI is recommended.   Electronically Signed   By: Marijo Conception, M.D.   On: 12/31/2014 14:52   Dg Foot Complete Right  12/30/2014   CLINICAL DATA:  Fall from bed 3 weeks ago. Trauma to right foot, hitting a a on a dresser/night standard. Increased swelling and throbbing of the right foot since then. The patient states the foot has been getting colder and feeling numb. He denies diabetes.  EXAM: RIGHT FOOT COMPLETE - 3+ VIEW  COMPARISON:  Right foot radiographs 12/26/2014.  FINDINGS: Mild osteopenia is again noted. No acute bone or soft tissue abnormality is present. Microvascular calcifications are present.  IMPRESSION: 1. No acute abnormality or significant interval change. 2. Mild osteopenia.   Electronically Signed   By: San Morelle M.D.   On: 12/30/2014 19:04    Scheduled Meds: . amLODipine  10 mg Oral Daily  . aspirin  325 mg Oral Daily  . atorvastatin  40 mg Oral q1800  . calcitRIOL  0.5 mcg Oral Daily  . carvedilol  25 mg Oral BID WC  . cloNIDine  0.2 mg Oral BID  . darbepoetin (ARANESP) injection - DIALYSIS  100 mcg Intravenous Q Sat-HD  . feeding supplement (RESOURCE BREEZE)  1 Container Oral TID BM  . heparin  5,000 Units Subcutaneous 3 times per day  . hydrALAZINE  50 mg Oral TID  . lisinopril  10 mg Oral Daily  . multivitamin  1 tablet Oral Daily  . pantoprazole  40 mg Oral Daily  . piperacillin-tazobactam (ZOSYN)  IV  2.25 g Intravenous Q8H  . [START ON 01/02/2015] predniSONE  10 mg Oral Q breakfast  . rOPINIRole  0.25-0.5 mg Oral QHS  . sevelamer carbonate  4,000 mg Oral TID WC  . sodium chloride  3 mL Intravenous Q12H  . vancomycin  750 mg Intravenous Q T,Th,Sa-HD   Continuous Infusions:  Antibiotics Given (last 72 hours)    Date/Time Action Medication Dose  Rate   12/31/14 0424 Given   vancomycin (VANCOCIN) 500 mg in sodium chloride 0.9 % 100 mL IVPB 500 mg 100 mL/hr   12/31/14 0626 Given   piperacillin-tazobactam (ZOSYN) IVPB 2.25 g 2.25 g 100 mL/hr   12/31/14 2202 Given   piperacillin-tazobactam (ZOSYN) IVPB 2.25 g 2.25 g 100 mL/hr   01/01/15 0602 Given   piperacillin-tazobactam (  ZOSYN) IVPB 2.25 g 2.25 g 100 mL/hr      Principal Problem:   Foot pain, right Active Problems:   HLD (hyperlipidemia)   Polycystic kidney   Lower GI bleed   ESRD (end stage renal disease) on dialysis   Foot pain   CHF (congestive heart failure)   CAD (coronary artery disease)   Hyperkalemia    Time spent: 25 min    Brittin Belnap  [facility] Pager [phone] If 7PM-7AM, please contact night-coverage at [email or web address], password [facility] 01/01/2015, 9:25 AM  LOS: 2 days

## 2015-01-01 NOTE — Progress Notes (Signed)
S: CO some pain in Rt fott.  Did well with HD yest O:BP 125/59 mmHg  Pulse 90  Temp(Src) 98.1 F (36.7 C) (Oral)  Resp 16  Ht 5\' 9"  (1.753 m)  Wt 72 kg (158 lb 11.7 oz)  BMI 23.43 kg/m2  SpO2 97%  Intake/Output Summary (Last 24 hours) at 01/01/15 0839 Last data filed at 01/01/15 0602  Gross per 24 hour  Intake    680 ml  Output   2500 ml  Net  -1820 ml   Weight change: 0.024 kg (0.9 oz) YTK:ZSWFU and alert CVS:RRR Resp: decreased BS bases Abd:+ BS NTND  PD cath site clean and dry Ext: ischemic 2nd,3rd 4th toes Rt foot.  Lt AVF + bruit NEURO:CNI, Ox3 no asterixis   . amLODipine  10 mg Oral Daily  . aspirin  325 mg Oral Daily  . atorvastatin  40 mg Oral q1800  . calcitRIOL  0.5 mcg Oral Daily  . carvedilol  25 mg Oral BID WC  . cloNIDine  0.2 mg Oral BID  . darbepoetin (ARANESP) injection - DIALYSIS  100 mcg Intravenous Q Sat-HD  . feeding supplement (RESOURCE BREEZE)  1 Container Oral TID BM  . heparin  5,000 Units Subcutaneous 3 times per day  . hydrALAZINE  50 mg Oral TID  . lisinopril  10 mg Oral Daily  . multivitamin  1 tablet Oral Daily  . pantoprazole  40 mg Oral Daily  . piperacillin-tazobactam (ZOSYN)  IV  2.25 g Intravenous Q8H  . [START ON 01/02/2015] predniSONE  10 mg Oral Q breakfast  . rOPINIRole  0.25-0.5 mg Oral QHS  . sevelamer carbonate  4,000 mg Oral TID WC  . sodium chloride  3 mL Intravenous Q12H  . vancomycin  750 mg Intravenous Q T,Th,Sa-HD   Ct Angio Ao+bifem W/cm &/or Wo/cm  12/31/2014   CLINICAL DATA:  Gangrene left foot.  EXAM: CT ANGIOGRAPHY OF ABDOMINAL AORTA WITH ILIOFEMORAL RUNOFF  TECHNIQUE: Multidetector CT imaging of the abdomen, pelvis and lower extremities was performed using the standard protocol during bolus administration of intravenous contrast. Multiplanar CT image reconstructions and MIPs were obtained to evaluate the vascular anatomy.  CONTRAST:  138mL OMNIPAQUE IOHEXOL 350 MG/ML SOLN  COMPARISON:  None.  FINDINGS: Aorta:  Atherosclerotic calcifications are seen involving the abdominal aorta and iliac arteries. No aneurysm or dissection is noted. Left gastric artery has separate origin and is widely patent. Remaining celiac artery is widely patent. Mild calcified stenosis is noted at the origin of the superior mesenteric artery. Severe stenosis is noted at the origin of the inferior mesenteric artery. Heavily calcified stenoses are noted at the origins of both renal arteries. No significant stenoses are noted in the abdominal aorta or iliac arteries.  Right Lower Extremity: Right common and profunda femoral arteries are heavily calcified but patent. However, there is complete occlusion of the proximal right superficial femoral artery with collateral reconstitution of the popliteal artery which is also heavily calcified with multiple stenoses. All 3 trifurcation vessels are heavily calcified. Only the peroneal artery demonstrates definite contrast enhancement. Presumably, the anterior and posterior tibial arteries are occluded.  Left Lower Extremity: Left common and profunda femoral arteries are heavily calcified but patent. There is complete occlusion of the proximal left superficial femoral artery. Collaterals are seen leading to reconstitution of the distal left superficial femoral artery popliteal artery, both of which are heavily calcified with multiple stenoses. The trifurcation vessels are heavily calcified as well with only definite opacification seen  within the peroneal branch. Presumably, the anterior and posterior tibial arteries are occluded.  Mild left pleural effusion is noted with adjacent subsegmental atelectasis. 2 cm rounded enhancing mass is noted in the midpole of the left kidney concerning for possible neoplasm or malignancy. There is no evidence of bowel obstruction. Peritoneal dialysis catheter is noted in the pelvis. Expected amount of free fluid is noted.  Review of the MIP images confirms the above findings.   IMPRESSION: Severe diffuse atherosclerotic calcifications are seen throughout all arteries in the abdomen, pelvis and lower extremities. There is no evidence of abdominal aortic aneurysm or dissection.  Probable severe calcified stenoses are seen involving both renal arteries.  Both superficial femoral arteries are occluded proximally, with reconstitution of the more distal portions and popliteal arteries through collaterals. There appears to be 1 vessel runoff in each calf through patent peroneal arteries.  2 cm enhancing mass is noted in the mid pole of left kidney concerning for possible neoplasm or malignancy. Further evaluation with MRI is recommended.   Electronically Signed   By: Marijo Conception, M.D.   On: 12/31/2014 14:52   Dg Foot Complete Right  12/30/2014   CLINICAL DATA:  Fall from bed 3 weeks ago. Trauma to right foot, hitting a a on a dresser/night standard. Increased swelling and throbbing of the right foot since then. The patient states the foot has been getting colder and feeling numb. He denies diabetes.  EXAM: RIGHT FOOT COMPLETE - 3+ VIEW  COMPARISON:  Right foot radiographs 12/26/2014.  FINDINGS: Mild osteopenia is again noted. No acute bone or soft tissue abnormality is present. Microvascular calcifications are present.  IMPRESSION: 1. No acute abnormality or significant interval change. 2. Mild osteopenia.   Electronically Signed   By: San Morelle M.D.   On: 12/30/2014 19:04   BMET    Component Value Date/Time   NA 138 12/31/2014 0432   K 4.5 12/31/2014 0432   CL 98 12/31/2014 0432   CO2 22 12/31/2014 0432   GLUCOSE 143* 12/31/2014 0432   BUN 44* 12/31/2014 0432   CREATININE 8.03* 12/31/2014 0432   CALCIUM 8.6 12/31/2014 0432   CALCIUM 8.1* 06/24/2012 1634   GFRNONAA 6* 12/31/2014 0432   GFRAA 6* 12/31/2014 0432   CBC    Component Value Date/Time   WBC 12.2* 12/31/2014 0432   RBC 2.84* 12/31/2014 0432   RBC 3.21* 10/08/2012 2241   HGB 8.3* 12/31/2014 0432    HCT 27.9* 12/31/2014 0432   PLT 295 12/31/2014 0432   MCV 98.2 12/31/2014 0432   MCH 29.2 12/31/2014 0432   MCHC 29.7* 12/31/2014 0432   RDW 19.3* 12/31/2014 0432   LYMPHSABS 0.7 12/30/2014 1754   MONOABS 0.9 12/30/2014 1754   EOSABS 0.0 12/30/2014 1754   BASOSABS 0.0 12/30/2014 1754     Assessment: 1. Cellulitis and ischemic Rt foot 2. ESRD 3. Sec HPTH on calcitriol 4. Anemia on aranesp 5. HTN 6. PVD 7. 2cm lesion Rt kidney  Plan: 1. Plan HD tues 2. Rec getting MRI of abd and if consistent with RCC then will need urolgy input   Deanna Boehlke T

## 2015-01-01 NOTE — Progress Notes (Addendum)
   VASCULAR SURGERY ASSESSMENT & PLAN:  * As noted, an incidental finding on his CT scan was a 2 cm enhancing mass in the midpole of the left kidney concerning for possible neoplasm or malignancy. Further evaluation with MRI was recommended. I will leave this decision to the primary service.  *  Respectively his nonhealing wound on the right foot, his CT scan shows multilevel arterial occlusive disease. I do not think that he has adequate circulation to heal this wound on the right foot. His only option for revascularization would be axillobifemoral bypass and a right infrainguinal bypass. His only runoff is the peroneal artery. Thus this procedure would be associated with significant risk, given his multiple comorbidities, yet he would still be at significant risk for limb loss. I would favor primary right above-the-knee amputation if the wounds in the right foot progress and he is agreeable.  SUBJECTIVE: still with right foot pain from injury. ROS: no chest pain or shortness of breath. PHYSICAL EXAM: Filed Vitals:   12/31/14 1929 12/31/14 2012 01/01/15 0500 01/01/15 0511  BP: 123/82 151/91  125/59  Pulse: 106 105  90  Temp: 98.3 F (36.8 C) 98.1 F (36.7 C)  98.1 F (36.7 C)  TempSrc:  Oral  Oral  Resp: 16 16  16   Height:      Weight: 154 lb 15.7 oz (70.3 kg)  158 lb 11.7 oz (72 kg)   SpO2:  97%  97%   No right femoral pulse. No change in wounds on right foot.  LABS: Lab Results  Component Value Date   WBC 12.2* 12/31/2014   HGB 8.3* 12/31/2014   HCT 27.9* 12/31/2014   MCV 98.2 12/31/2014   PLT 295 12/31/2014   Lab Results  Component Value Date   CREATININE 8.03* 12/31/2014   Lab Results  Component Value Date   INR 1.12 12/30/2014   CT ANGIOGRAM: this shows severe diffuse aortoiliac disease and occluded superficial femoral arteries bilaterally. He has one-vessel runoff bilaterally via the peroneal arteries.  Principal Problem:   Foot pain, right Active Problems:  HLD (hyperlipidemia)   Polycystic kidney   Lower GI bleed   ESRD (end stage renal disease) on dialysis   Foot pain   CHF (congestive heart failure)   CAD (coronary artery disease)   Hyperkalemia    [name] Beeper: [phone] 01/01/2015

## 2015-01-02 LAB — BASIC METABOLIC PANEL
Anion gap: 14 (ref 5–15)
BUN: 39 mg/dL — AB (ref 6–23)
CHLORIDE: 95 mmol/L — AB (ref 96–112)
CO2: 23 mmol/L (ref 19–32)
Calcium: 8.6 mg/dL (ref 8.4–10.5)
Creatinine, Ser: 6.94 mg/dL — ABNORMAL HIGH (ref 0.50–1.35)
GFR calc Af Amer: 8 mL/min — ABNORMAL LOW (ref >90)
GFR calc non Af Amer: 7 mL/min — ABNORMAL LOW (ref >90)
Glucose, Bld: 118 mg/dL — ABNORMAL HIGH (ref 70–99)
Potassium: 3.7 mmol/L (ref 3.5–5.1)
Sodium: 132 mmol/L — ABNORMAL LOW (ref 135–145)

## 2015-01-02 LAB — CBC
HEMATOCRIT: 28.3 % — AB (ref 39.0–52.0)
Hemoglobin: 8.5 g/dL — ABNORMAL LOW (ref 13.0–17.0)
MCH: 29.5 pg (ref 26.0–34.0)
MCHC: 30 g/dL (ref 30.0–36.0)
MCV: 98.3 fL (ref 78.0–100.0)
Platelets: 306 10*3/uL (ref 150–400)
RBC: 2.88 MIL/uL — ABNORMAL LOW (ref 4.22–5.81)
RDW: 18.7 % — AB (ref 11.5–15.5)
WBC: 9.8 10*3/uL (ref 4.0–10.5)

## 2015-01-02 NOTE — Progress Notes (Signed)
Physical Therapy Treatment Patient Details Name: Randy [name] MRN: [id number] DOB: 02-17-1932 Today's Date: 01/02/2015    History of Present Illness Patient is an 81 yo male admitted 12/30/14 with Rt foot pain and cellulitis.  Patient had recent fall and injured Rt foot.  Now in process of vascular surgery work-up for peripheral vascular disease.  PMH:  HLD, HTN, ESRD now on HD, CHF, CAD, cardiomyopathy, NSTEMI    PT Comments    Pt progressing ambulation distance with quad cane and appears optimistic about dc home. Depending on patients prognosis, currently recommending a dc home with 24/7 supervision with wife.  Included single leg balance and chair pushups in the session to prepare the patient if he receives R above-knee-amputation.  If the patient requires AKA before dc home, then a PT re-evaluation must be completed as inpatient rehab may be the best plan for dc from acute care.   Follow Up Recommendations  Home health PT;Supervision/Assistance - 24 hour     Equipment Recommendations  None recommended by PT    Recommendations for Other Services       Precautions / Restrictions Precautions Precautions: Fall Restrictions Weight Bearing Restrictions: No    Mobility  Bed Mobility               General bed mobility comments: Patient found seated in recliner upon OT entering room  Transfers Overall transfer level: Needs assistance Equipment used: Quad cane Transfers: Sit to/from Stand Sit to Stand: Min guard;Supervision         General transfer comment: supervision >min guard needed for sit<>stand transfers.   Ambulation/Gait Ambulation/Gait assistance: Min guard Ambulation Distance (Feet): 100 Feet Assistive device: Quad cane Gait Pattern/deviations: Step-through pattern;Decreased weight shift to right;Decreased step length - left;Decreased stance time - right Gait velocity: Decreased Gait velocity interpretation: Below normal speed for age/gender General Gait  Details: Patient states he alternates UEs for use of the cane. Slow and guarded gait due to decreased sensation. Slight unsteadyness.   Stairs            Wheelchair Mobility    Modified Rankin (Stroke Patients Only)       Balance Overall balance assessment: Needs assistance Sitting-balance support: No upper extremity supported;Feet supported Sitting balance-Leahy Scale: Good     Standing balance support: Single extremity supported;During functional activity Standing balance-Leahy Scale: Fair                      Cognition Arousal/Alertness: Awake/alert Behavior During Therapy: WFL for tasks assessed/performed Overall Cognitive Status: Within Functional Limits for tasks assessed                      Exercises General Exercises - Upper Extremity Chair Push Up: AROM;Both;10 reps;Seated General Exercises - Lower Extremity Hip Flexion/Marching: 10 reps;Both;AROM;Standing (holding quad cane with R UE)    General Comments        Pertinent Vitals/Pain Pain Assessment: 0-10 Pain Score: 6  Pain Location: BLEs Pain Descriptors / Indicators: Numbness Pain Intervention(s): Monitored during session    Home Living Family/patient expects to be discharged to:: Private residence Living Arrangements: Spouse/significant other Available Help at Discharge: Family;Available 24 hours/day Type of Home: House Home Access: Stairs to enter Entrance Stairs-Rails: None Home Layout: One level Home Equipment: Environmental consultant - 2 wheels;Cane - quad;Shower seat;Wheelchair - manual      Prior Function Level of Independence: Independent with assistive device(s);Needs assistance  Gait / Transfers Assistance Needed: Patient used quad  cane for ambulation ADL's / Homemaking Assistance Needed: Assist for meal prep and housekeeping; wife completes IADL tasks at home  Comments: Patient states he has both a walk-in shower and a tub/shower, wife assisted with transfers in/out tub/shower    PT Goals (current goals can now be found in the care plan section) Acute Rehab PT Goals Patient Stated Goal: To go home Progress towards PT goals: Progressing toward goals    Frequency  Min 3X/week    PT Plan      Co-evaluation             End of Session Equipment Utilized During Treatment: Gait belt Activity Tolerance: Patient tolerated treatment well;No increased pain;Patient limited by fatigue Patient left: in chair;with call bell/phone within reach     Time: 1128-1148 PT Time Calculation (min) (ACUTE ONLY): 20 min  Charges:                       G Codes:      Andyn Schake, Rachael 2015-01-17, 1:42 PM   Rachael Sohil Timko, SPT Acute Rehabilitation Services Office: [phone]

## 2015-01-02 NOTE — Progress Notes (Signed)
   VASCULAR SURGERY ASSESSMENT & PLAN:  * No change in ischemic toes right foot. As per my note yesterday, his only option for revascularization would be a combined axillobifemoral bypass graft and infrainguinal bypass. However his only runoff is the peroneal artery so this would be associated with significant risk and only a small chance of success.  *  For this reason, I would recommend primary above-the-knee amputation if the foot wound progresses. Given that he does not have a femoral pulse and has diffuse multilevel disease, he may not heal a below-the-knee amputation.  SUBJECTIVE: pain is better today.  PHYSICAL EXAM: Filed Vitals:   01/01/15 1800 01/01/15 2107 01/02/15 0437 01/02/15 0500  BP: 120/66 112/62 130/68   Pulse: 90 80 74   Temp: 98.2 F (36.8 C) 98.1 F (36.7 C) 98.6 F (37 C)   TempSrc: Oral Oral Oral   Resp: 18 17 16    Height:  5\' 9"  (1.753 m)    Weight:  157 lb 11.5 oz (71.54 kg)  157 lb 11.5 oz (71.54 kg)  SpO2: 98% 99% 99%    Ischemic toes right foot are unchanged.  LABS: Lab Results  Component Value Date   WBC 9.8 01/02/2015   HGB 8.5* 01/02/2015   HCT 28.3* 01/02/2015   MCV 98.3 01/02/2015   PLT 306 01/02/2015   Lab Results  Component Value Date   CREATININE 6.94* 01/02/2015   Lab Results  Component Value Date   INR 1.12 12/30/2014   CBG (last 3)  No results for input(s): GLUCAP in the last 72 hours.  Principal Problem:   Foot pain, right Active Problems:   HLD (hyperlipidemia)   Polycystic kidney   Lower GI bleed   ESRD (end stage renal disease) on dialysis   Foot pain   CHF (congestive heart failure)   CAD (coronary artery disease)   Hyperkalemia   Renal mass    [name] Beeper: [phone] 01/02/2015

## 2015-01-02 NOTE — Progress Notes (Signed)
PROGRESS NOTE  Randy [name] QQP:[id number] DOB: 1932-05-13 DOA: 12/30/2014 PCP: [name], MD  Assessment/Plan: Right foot pain:  -treated outpatient with Keflex due to suspected cellulitis,without improvement.  -foot is cool, indicating very poor circulation. - had arterial Seg signal doppler study on 12/27/14, which showed evidence of significant peripheral small vessel tibial disease. -IV abx -vascular consult: CT scan shows multilevel arterial occlusive disease. do not think that he has adequate circulation to heal this wound on the right foot. His only option for revascularization would be axillobifemoral bypass and a right infrainguinal bypass. His only runoff is the peroneal artery. Thus this procedure would be associated with significant risk, given his multiple comorbidities, yet he would still be at significant risk for limb loss. would favor primary right above-the-knee amputation if the wounds in the right foot progress and he is agreeable.  Kidney mass:  incidental finding on his CT scan was a 2 cm enhancing mass in the midpole of the left kidney concerning for possible neoplasm or malignancy. MRI: Follow up CT without and with contrast in 1 year suggested to assess the stability of this lesion.  ESRD-HD with hyperkalemia at admission: renal for HD  Hypertension: -continue home medications: Amlodipine, clonidine, hydralazine, lisinopril, Coreg  Hyperlipidemia: -Lipitor  GERD: -Protonix  CAD: Currently no chest pain. EKG has no new change. -Continue aspirin and Lipitor  Code Status: full Family Communication: patient/called wife to update and to ensure understanding of recommendations Disposition Plan:    Consultants:    Procedures:      HPI/Subjective: Foot pain improved  Objective: Filed Vitals:   01/02/15 0805  BP: 125/84  Pulse: 102  Temp: 97.8 F (36.6 C)  Resp: 18    Intake/Output Summary (Last 24 hours) at 01/02/15 0946 Last data  filed at 01/02/15 0902  Gross per 24 hour  Intake   1080 ml  Output      0 ml  Net   1080 ml   Filed Weights   01/01/15 0500 01/01/15 2107 01/02/15 0500  Weight: 72 kg (158 lb 11.7 oz) 71.54 kg (157 lb 11.5 oz) 71.54 kg (157 lb 11.5 oz)    Exam:   General:  NAD, able to relay what was told to him by vascular  Cardiovascular: rrr  Respiratory: clear  Abdomen: +BS, soft  Musculoskeletal: min edema in b/l leg   Data Reviewed: Basic Metabolic Panel:  Recent Labs Lab 12/27/14 1540 12/30/14 1754 12/31/14 0432 12/31/14 1210 01/02/15 0447  NA 137 138 138  --  132*  K 4.5 5.4* 4.5  --  3.7  CL 99 99 98  --  95*  CO2 26 27 22   --  23  GLUCOSE 90 117* 143*  --  118*  BUN 25* 42* 44*  --  39*  CREATININE 5.52* 7.80* 8.03*  --  6.94*  CALCIUM 8.9 8.8 8.6  --  8.6  PHOS  --   --   --  6.7*  --    Liver Function Tests:  Recent Labs Lab 12/30/14 1754 12/31/14 0432  AST 48* 29  ALT 47 41  ALKPHOS 83 65  BILITOT 0.4 0.5  PROT 5.8* 5.9*  ALBUMIN 2.9* 2.7*   No results for input(s): LIPASE, AMYLASE in the last 168 hours. No results for input(s): AMMONIA in the last 168 hours. CBC:  Recent Labs Lab 12/27/14 1540 12/30/14 1754 12/31/14 0432 01/02/15 0447  WBC 10.4 14.4* 12.2* 9.8  NEUTROABS 8.9* 12.8*  --   --  HGB 9.6* 8.3* 8.3* 8.5*  HCT 30.7* 27.1* 27.9* 28.3*  MCV 99.4 97.8 98.2 98.3  PLT 309 290 295 306   Cardiac Enzymes: No results for input(s): CKTOTAL, CKMB, CKMBINDEX, TROPONINI in the last 168 hours. BNP (last 3 results) No results for input(s): BNP in the last 8760 hours.  ProBNP (last 3 results)  Recent Labs  06/21/14 1046 08/17/14 0625 08/29/14 0928  PROBNP >70000.0* >70000.0* >70000.0*    CBG: No results for input(s): GLUCAP in the last 168 hours.  Recent Results (from the past 240 hour(s))  Blood culture (routine x 2)     Status: None (Preliminary result)   Collection Time: 12/30/14  5:54 PM  Result Value Ref Range Status    Specimen Description BLOOD RIGHT FOREARM  Final   Special Requests BOTTLES DRAWN AEROBIC AND ANAEROBIC 10CC  Final   Culture   Final           BLOOD CULTURE RECEIVED NO GROWTH TO DATE CULTURE WILL BE HELD FOR 5 DAYS BEFORE ISSUING A FINAL NEGATIVE REPORT Performed at [facility]    Report Status PENDING  Incomplete  Blood culture (routine x 2)     Status: None (Preliminary result)   Collection Time: 12/30/14  6:06 PM  Result Value Ref Range Status   Specimen Description BLOOD HAND RIGHT  Final   Special Requests BOTTLES DRAWN AEROBIC AND ANAEROBIC 5CC  Final   Culture   Final           BLOOD CULTURE RECEIVED NO GROWTH TO DATE CULTURE WILL BE HELD FOR 5 DAYS BEFORE ISSUING A FINAL NEGATIVE REPORT Performed at [facility]    Report Status PENDING  Incomplete  MRSA PCR Screening     Status: None   Collection Time: 12/31/14  1:11 AM  Result Value Ref Range Status   MRSA by PCR NEGATIVE NEGATIVE Final    Comment:        The GeneXpert MRSA Assay (FDA approved for NASAL specimens only), is one component of a comprehensive MRSA colonization surveillance program. It is not intended to diagnose MRSA infection nor to guide or monitor treatment for MRSA infections.      Studies: Mr Abdomen Wo Contrast  02/01/2015   CLINICAL DATA:  Enhancing left renal mass on CT angiography. Hemodialysis patient. Initial encounter.  EXAM: MRI ABDOMEN WITHOUT CONTRAST  TECHNIQUE: Multiplanar multisequence MR imaging was performed without the administration of intravenous contrast.  COMPARISON:  Abdominal CTA 12/31/2014.  FINDINGS: Study is mildly motion degraded. No contrast was administered secondary to renal insufficiency.  Lower chest: Left pleural effusion and left lower lobe atelectasis are again noted. The heart is mildly enlarged.  Hepatobiliary: Decreased T2 signal throughout the liver is likely secondary to iron overload from repeated transfusions. No focal lesions identified. The  gallbladder is contracted with mild wall thickening. No evidence of gallstones or biliary dilatation.  Pancreas: Unremarkable. No pancreatic ductal dilatation or surrounding inflammatory changes.  Spleen: Normal in size without focal abnormality.  Adrenals/Urinary Tract: Small right adrenal adenoma demonstrates loss of signal on the opposed phase gradient echo images for the left adrenal gland appears normal.Both kidneys are atrophied with cortical thinning. There are multiple renal cysts bilaterally. The largest cyst is in the interpolar region of the right kidney, measuring 4.4 cm transverse. The lesion of concern on CT in the anterior interpolar region of the left kidney is best seen on the coronal images and demonstrates mildly decreased T2 and increased T1  signal. It measures approximately 1.5 cm. There is another complex lesion in the upper pole of the left kidney which also demonstrates increased T1 signal, best seen on the coronal images.  Stomach/Bowel: No evidence of bowel wall thickening, distention or surrounding inflammatory change.  Vascular/Lymphatic: There are no enlarged abdominal lymph nodes. Diffuse atherosclerosis, better seen on CTA.  Other: None.  Musculoskeletal: No acute or significant osseous findings.  IMPRESSION: 1. The lesion of concern in the anterior interpolar region of the left kidney is indeterminate on this noncontrast study, demonstrating mildly decreased T2 signal and mildly increased T1 signal. This could reflect a solid mass or complex cyst. Of note, the recent CTA did not include any precontrast images such that enhancement this lesion is not confirmed. Ultrasound is unlikely to resolve this finding. Follow up CT without and with contrast in 1 year suggested to assess the stability of this lesion. 2. There are additional simple and mildly complex renal cysts bilaterally. 3. Stable small right adrenal adenoma.   Electronically Signed   By: Richardean Sale M.D.   On: 01/02/2015  08:33   Ct Angio Ao+bifem W/cm &/or Wo/cm  12/31/2014   CLINICAL DATA:  Gangrene left foot.  EXAM: CT ANGIOGRAPHY OF ABDOMINAL AORTA WITH ILIOFEMORAL RUNOFF  TECHNIQUE: Multidetector CT imaging of the abdomen, pelvis and lower extremities was performed using the standard protocol during bolus administration of intravenous contrast. Multiplanar CT image reconstructions and MIPs were obtained to evaluate the vascular anatomy.  CONTRAST:  179mL OMNIPAQUE IOHEXOL 350 MG/ML SOLN  COMPARISON:  None.  FINDINGS: Aorta: Atherosclerotic calcifications are seen involving the abdominal aorta and iliac arteries. No aneurysm or dissection is noted. Left gastric artery has separate origin and is widely patent. Remaining celiac artery is widely patent. Mild calcified stenosis is noted at the origin of the superior mesenteric artery. Severe stenosis is noted at the origin of the inferior mesenteric artery. Heavily calcified stenoses are noted at the origins of both renal arteries. No significant stenoses are noted in the abdominal aorta or iliac arteries.  Right Lower Extremity: Right common and profunda femoral arteries are heavily calcified but patent. However, there is complete occlusion of the proximal right superficial femoral artery with collateral reconstitution of the popliteal artery which is also heavily calcified with multiple stenoses. All 3 trifurcation vessels are heavily calcified. Only the peroneal artery demonstrates definite contrast enhancement. Presumably, the anterior and posterior tibial arteries are occluded.  Left Lower Extremity: Left common and profunda femoral arteries are heavily calcified but patent. There is complete occlusion of the proximal left superficial femoral artery. Collaterals are seen leading to reconstitution of the distal left superficial femoral artery popliteal artery, both of which are heavily calcified with multiple stenoses. The trifurcation vessels are heavily calcified as well with  only definite opacification seen within the peroneal branch. Presumably, the anterior and posterior tibial arteries are occluded.  Mild left pleural effusion is noted with adjacent subsegmental atelectasis. 2 cm rounded enhancing mass is noted in the midpole of the left kidney concerning for possible neoplasm or malignancy. There is no evidence of bowel obstruction. Peritoneal dialysis catheter is noted in the pelvis. Expected amount of free fluid is noted.  Review of the MIP images confirms the above findings.  IMPRESSION: Severe diffuse atherosclerotic calcifications are seen throughout all arteries in the abdomen, pelvis and lower extremities. There is no evidence of abdominal aortic aneurysm or dissection.  Probable severe calcified stenoses are seen involving both renal arteries.  Both superficial  femoral arteries are occluded proximally, with reconstitution of the more distal portions and popliteal arteries through collaterals. There appears to be 1 vessel runoff in each calf through patent peroneal arteries.  2 cm enhancing mass is noted in the mid pole of left kidney concerning for possible neoplasm or malignancy. Further evaluation with MRI is recommended.   Electronically Signed   By: Marijo Conception, M.D.   On: 12/31/2014 14:52    Scheduled Meds: . amLODipine  10 mg Oral Daily  . aspirin  325 mg Oral Daily  . atorvastatin  40 mg Oral q1800  . calcitRIOL  0.5 mcg Oral Daily  . carvedilol  25 mg Oral BID WC  . cloNIDine  0.2 mg Oral BID  . darbepoetin (ARANESP) injection - DIALYSIS  100 mcg Intravenous Q Sat-HD  . feeding supplement (RESOURCE BREEZE)  1 Container Oral TID BM  . heparin  5,000 Units Subcutaneous 3 times per day  . hydrALAZINE  50 mg Oral TID  . lisinopril  10 mg Oral Daily  . multivitamin  1 tablet Oral Daily  . pantoprazole  40 mg Oral Daily  . piperacillin-tazobactam (ZOSYN)  IV  2.25 g Intravenous Q8H  . predniSONE  10 mg Oral Q breakfast  . rOPINIRole  0.25-0.5 mg Oral  QHS  . sevelamer carbonate  4,000 mg Oral TID WC  . sodium chloride  3 mL Intravenous Q12H  . vancomycin  750 mg Intravenous Q T,Th,Sa-HD   Continuous Infusions:  Antibiotics Given (last 72 hours)    Date/Time Action Medication Dose Rate   12/31/14 0424 Given   vancomycin (VANCOCIN) 500 mg in sodium chloride 0.9 % 100 mL IVPB 500 mg 100 mL/hr   12/31/14 0626 Given   piperacillin-tazobactam (ZOSYN) IVPB 2.25 g 2.25 g 100 mL/hr   12/31/14 2202 Given   piperacillin-tazobactam (ZOSYN) IVPB 2.25 g 2.25 g 100 mL/hr   01/01/15 0602 Given   piperacillin-tazobactam (ZOSYN) IVPB 2.25 g 2.25 g 100 mL/hr   01/01/15 1057 Given   vancomycin (VANCOCIN) IVPB 750 mg/150 ml premix 750 mg 150 mL/hr   01/01/15 1328 Given   vancomycin (VANCOCIN) IVPB 750 mg/150 ml premix 750 mg 150 mL/hr   01/01/15 1545 Given   piperacillin-tazobactam (ZOSYN) IVPB 2.25 g 2.25 g 100 mL/hr   01/01/15 2111 Given   piperacillin-tazobactam (ZOSYN) IVPB 2.25 g 2.25 g 100 mL/hr   01/02/15 0550 Given   piperacillin-tazobactam (ZOSYN) IVPB 2.25 g 2.25 g 100 mL/hr      Principal Problem:   Foot pain, right Active Problems:   HLD (hyperlipidemia)   Polycystic kidney   Lower GI bleed   ESRD (end stage renal disease) on dialysis   Foot pain   CHF (congestive heart failure)   CAD (coronary artery disease)   Hyperkalemia   Renal mass    Time spent: 25 min    Winfred Iiams  [facility] Pager [phone] If 7PM-7AM, please contact night-coverage at [email or web address], password [facility] 01/02/2015, 9:46 AM  LOS: 3 days

## 2015-01-02 NOTE — Progress Notes (Signed)
Assessment: 1. Cellulitis and ischemic Rt foot 2. ESRD--Plan for next HD in AM 3. Sec HPTH on calcitriol 4. Anemia on aranesp 5. HTN 6. PVD 7. 2cm lesion Rt kidney  Subjective: Interval History: Foot hurts  Objective: Vital signs in last 24 hours: Temp:  [97.8 F (36.6 C)-98.6 F (37 C)] 97.8 F (36.6 C) (04/11 0805) Pulse Rate:  [74-102] 102 (04/11 0805) Resp:  [16-18] 18 (04/11 0805) BP: (112-130)/(62-84) 125/84 mmHg (04/11 0805) SpO2:  [98 %-99 %] 99 % (04/11 0805) Weight:  [71.54 kg (157 lb 11.5 oz)] 71.54 kg (157 lb 11.5 oz) (04/11 0500) Weight change: -1.06 kg (-2 lb 5.4 oz)  Intake/Output from previous day: 04/10 0701 - 04/11 0700 In: 1080 [P.O.:1080] Out: 0  Intake/Output this shift: Total I/O In: 240 [P.O.:240] Out: -   General appearance: alert and cooperative Resp: clear to auscultation bilaterally Chest wall: no tenderness Cardio: systolic murmur: systolic ejection 3/6, crescendo and decrescendo at 2nd right intercostal space Extremities: extremities normal, atraumatic, no cyanosis or edema, socks on feet NE  Lab Results:  Recent Labs  12/31/14 0432 01/02/15 0447  WBC 12.2* 9.8  HGB 8.3* 8.5*  HCT 27.9* 28.3*  PLT 295 306   BMET:  Recent Labs  12/31/14 0432 01/02/15 0447  NA 138 132*  K 4.5 3.7  CL 98 95*  CO2 22 23  GLUCOSE 143* 118*  BUN 44* 39*  CREATININE 8.03* 6.94*  CALCIUM 8.6 8.6   No results for input(s): PTH in the last 72 hours. Iron Studies: No results for input(s): IRON, TIBC, TRANSFERRIN, FERRITIN in the last 72 hours. Studies/Results: Mr Abdomen Wo Contrast  01/02/2015   CLINICAL DATA:  Enhancing left renal mass on CT angiography. Hemodialysis patient. Initial encounter.  EXAM: MRI ABDOMEN WITHOUT CONTRAST  TECHNIQUE: Multiplanar multisequence MR imaging was performed without the administration of intravenous contrast.  COMPARISON:  Abdominal CTA 12/31/2014.  FINDINGS: Study is mildly motion degraded. No contrast was  administered secondary to renal insufficiency.  Lower chest: Left pleural effusion and left lower lobe atelectasis are again noted. The heart is mildly enlarged.  Hepatobiliary: Decreased T2 signal throughout the liver is likely secondary to iron overload from repeated transfusions. No focal lesions identified. The gallbladder is contracted with mild wall thickening. No evidence of gallstones or biliary dilatation.  Pancreas: Unremarkable. No pancreatic ductal dilatation or surrounding inflammatory changes.  Spleen: Normal in size without focal abnormality.  Adrenals/Urinary Tract: Small right adrenal adenoma demonstrates loss of signal on the opposed phase gradient echo images for the left adrenal gland appears normal.Both kidneys are atrophied with cortical thinning. There are multiple renal cysts bilaterally. The largest cyst is in the interpolar region of the right kidney, measuring 4.4 cm transverse. The lesion of concern on CT in the anterior interpolar region of the left kidney is best seen on the coronal images and demonstrates mildly decreased T2 and increased T1 signal. It measures approximately 1.5 cm. There is another complex lesion in the upper pole of the left kidney which also demonstrates increased T1 signal, best seen on the coronal images.  Stomach/Bowel: No evidence of bowel wall thickening, distention or surrounding inflammatory change.  Vascular/Lymphatic: There are no enlarged abdominal lymph nodes. Diffuse atherosclerosis, better seen on CTA.  Other: None.  Musculoskeletal: No acute or significant osseous findings.  IMPRESSION: 1. The lesion of concern in the anterior interpolar region of the left kidney is indeterminate on this noncontrast study, demonstrating mildly decreased T2 signal and mildly increased  T1 signal. This could reflect a solid mass or complex cyst. Of note, the recent CTA did not include any precontrast images such that enhancement this lesion is not confirmed. Ultrasound is  unlikely to resolve this finding. Follow up CT without and with contrast in 1 year suggested to assess the stability of this lesion. 2. There are additional simple and mildly complex renal cysts bilaterally. 3. Stable small right adrenal adenoma.   Electronically Signed   By: Richardean Sale M.D.   On: 01/02/2015 08:33   Ct Angio Ao+bifem W/cm &/or Wo/cm  12/31/2014   CLINICAL DATA:  Gangrene left foot.  EXAM: CT ANGIOGRAPHY OF ABDOMINAL AORTA WITH ILIOFEMORAL RUNOFF  TECHNIQUE: Multidetector CT imaging of the abdomen, pelvis and lower extremities was performed using the standard protocol during bolus administration of intravenous contrast. Multiplanar CT image reconstructions and MIPs were obtained to evaluate the vascular anatomy.  CONTRAST:  169mL OMNIPAQUE IOHEXOL 350 MG/ML SOLN  COMPARISON:  None.  FINDINGS: Aorta: Atherosclerotic calcifications are seen involving the abdominal aorta and iliac arteries. No aneurysm or dissection is noted. Left gastric artery has separate origin and is widely patent. Remaining celiac artery is widely patent. Mild calcified stenosis is noted at the origin of the superior mesenteric artery. Severe stenosis is noted at the origin of the inferior mesenteric artery. Heavily calcified stenoses are noted at the origins of both renal arteries. No significant stenoses are noted in the abdominal aorta or iliac arteries.  Right Lower Extremity: Right common and profunda femoral arteries are heavily calcified but patent. However, there is complete occlusion of the proximal right superficial femoral artery with collateral reconstitution of the popliteal artery which is also heavily calcified with multiple stenoses. All 3 trifurcation vessels are heavily calcified. Only the peroneal artery demonstrates definite contrast enhancement. Presumably, the anterior and posterior tibial arteries are occluded.  Left Lower Extremity: Left common and profunda femoral arteries are heavily calcified but  patent. There is complete occlusion of the proximal left superficial femoral artery. Collaterals are seen leading to reconstitution of the distal left superficial femoral artery popliteal artery, both of which are heavily calcified with multiple stenoses. The trifurcation vessels are heavily calcified as well with only definite opacification seen within the peroneal branch. Presumably, the anterior and posterior tibial arteries are occluded.  Mild left pleural effusion is noted with adjacent subsegmental atelectasis. 2 cm rounded enhancing mass is noted in the midpole of the left kidney concerning for possible neoplasm or malignancy. There is no evidence of bowel obstruction. Peritoneal dialysis catheter is noted in the pelvis. Expected amount of free fluid is noted.  Review of the MIP images confirms the above findings.  IMPRESSION: Severe diffuse atherosclerotic calcifications are seen throughout all arteries in the abdomen, pelvis and lower extremities. There is no evidence of abdominal aortic aneurysm or dissection.  Probable severe calcified stenoses are seen involving both renal arteries.  Both superficial femoral arteries are occluded proximally, with reconstitution of the more distal portions and popliteal arteries through collaterals. There appears to be 1 vessel runoff in each calf through patent peroneal arteries.  2 cm enhancing mass is noted in the mid pole of left kidney concerning for possible neoplasm or malignancy. Further evaluation with MRI is recommended.   Electronically Signed   By: Marijo Conception, M.D.   On: 12/31/2014 14:52   Scheduled: . amLODipine  10 mg Oral Daily  . aspirin  325 mg Oral Daily  . atorvastatin  40 mg Oral q1800  .  calcitRIOL  0.5 mcg Oral Daily  . carvedilol  25 mg Oral BID WC  . cloNIDine  0.2 mg Oral BID  . darbepoetin (ARANESP) injection - DIALYSIS  100 mcg Intravenous Q Sat-HD  . feeding supplement (RESOURCE BREEZE)  1 Container Oral TID BM  . heparin  5,000  Units Subcutaneous 3 times per day  . hydrALAZINE  50 mg Oral TID  . lisinopril  10 mg Oral Daily  . multivitamin  1 tablet Oral Daily  . pantoprazole  40 mg Oral Daily  . piperacillin-tazobactam (ZOSYN)  IV  2.25 g Intravenous Q8H  . predniSONE  10 mg Oral Q breakfast  . rOPINIRole  0.25-0.5 mg Oral QHS  . sevelamer carbonate  4,000 mg Oral TID WC  . sodium chloride  3 mL Intravenous Q12H  . vancomycin  750 mg Intravenous Q T,Th,Sa-HD      LOS: 3 days   Jasma Seevers C 01/02/2015,12:06 PM

## 2015-01-02 NOTE — Consult Note (Signed)
WOC will not consult on this patient, vascular has evaluated this patient and is planning surgical intervention if patient agrees.    Mally Gavina [place] RN,CWOCN [phone]

## 2015-01-02 NOTE — Evaluation (Signed)
Occupational Therapy Evaluation Patient Details Name: Randy [name] MRN: [id number] DOB: 11-29-1931 Today's Date: 01/02/2015    History of Present Illness Patient is an 81 yo male admitted 12/30/14 with Rt foot pain and cellulitis.  Patient had recent fall and injured Rt foot.  Now in process of vascular surgery work-up for peripheral vascular disease.  PMH:  HLD, HTN, ESRD now on HD, CHF, CAD, cardiomyopathy, NSTEMI   Clinical Impression   Patient independent>mod I PTA. Patient currently requires supervision>min guard assist. Patient will benefit from acute OT to increase overall independence in the areas of ADLs, functional mobility, BUE strengthening, and overall safety in order to safely discharge home with 24/7 supervision. Per Dr notes, may plan for right AKA. Depending on patient's prognosis, currently recommending no follow-up with 24/7 supervision post acute d/c. IF patient has right AKA, recommending an OT re-evaluation to optimize patient's independence and quality of life prior to straight d/cing >home.     Follow Up Recommendations  No OT follow up;Supervision/Assistance - 24 hour    Equipment Recommendations  None recommended by OT    Recommendations for Other Services  None at this time   Precautions / Restrictions Precautions Precautions: Fall Restrictions Weight Bearing Restrictions: No      Mobility Bed Mobility General bed mobility comments: Patient found seated in recliner upon OT entering room  Transfers Overall transfer level: Needs assistance Equipment used: Quad cane Transfers: Sit to/from Stand Sit to Stand: Min guard;Supervision  General transfer comment: supervision >min guard needed for sit<>stand transfers.     Balance Overall balance assessment: Needs assistance Sitting-balance support: No upper extremity supported;Feet supported Sitting balance-Leahy Scale: Good     Standing balance support: Single extremity supported;During functional  activity Standing balance-Leahy Scale: Fair      ADL Overall ADL's : Needs assistance/impaired General ADL Comments: Patient overall supervision>min guard assist for functional tasks. Patient is able to cross BLEs for LB ADLs. Patient will potentially going through surgery to have a RAKA. As of now, plan to follow patient for overall safety prior to d/c>home. IF patient undergoes R AKA, recommending an acute OT re-evaluation.     Pertinent Vitals/Pain Pain Assessment: 0-10 Pain Score: 6  Pain Location: BLEs Pain Descriptors / Indicators: Numbness Pain Intervention(s): Monitored during session     Hand Dominance Right   Extremity/Trunk Assessment Upper Extremity Assessment Upper Extremity Assessment: Generalized weakness   Lower Extremity Assessment Lower Extremity Assessment: Defer to PT evaluation   Cervical / Trunk Assessment Cervical / Trunk Assessment: Kyphotic   Communication Communication Communication: No difficulties   Cognition Arousal/Alertness: Awake/alert Behavior During Therapy: WFL for tasks assessed/performed Overall Cognitive Status: Within Functional Limits for tasks assessed             Home Living Family/patient expects to be discharged to:: Private residence Living Arrangements: Spouse/significant other Available Help at Discharge: Family;Available 24 hours/day Type of Home: House Home Access: Stairs to enter [employer] of Steps: 2 Entrance Stairs-Rails: None Home Layout: One level     Bathroom Shower/Tub: Walk-in shower;Door   Bathroom Toilet: Handicapped height     Home Equipment: Environmental consultant - 2 wheels;Cane - quad;Shower seat;Wheelchair - manual   Prior Functioning/Environment Level of Independence: Independent with assistive device(s);Needs assistance  Gait / Transfers Assistance Needed: Patient used quad cane for ambulation ADL's / Homemaking Assistance Needed: Assist for meal prep and housekeeping; wife completes IADL tasks at  home    Comments: Patient states he has both a walk-in shower and  a tub/shower, wife assisted with transfers in/out tub/shower    OT Diagnosis: Generalized weakness;Acute pain   OT Problem List: Decreased strength;Decreased activity tolerance;Impaired balance (sitting and/or standing);Decreased safety awareness;Decreased knowledge of use of DME or AE;Decreased knowledge of precautions;Pain   OT Treatment/Interventions: Self-care/ADL training;Energy conservation;DME and/or AE instruction;Therapeutic activities;Patient/family education;Balance training    OT Goals(Current goals can be found in the care plan section) Acute Rehab OT Goals Patient Stated Goal: To go home OT Goal Formulation: With patient Time For Goal Achievement: 01/09/15 Potential to Achieve Goals: Good ADL Goals Pt Will Perform Grooming: with modified independence;standing Pt Will Perform Upper Body Bathing: Independently;sitting Pt Will Perform Lower Body Bathing: with modified independence;sit to/from stand Pt Will Perform Upper Body Dressing: Independently;sitting Pt Will Perform Lower Body Dressing: with modified independence;sit to/from stand Pt Will Transfer to Toilet: with modified independence;ambulating;bedside commode Pt Will Perform Toileting - Clothing Manipulation and hygiene: with modified independence;sit to/from stand Pt/caregiver will Perform Home Exercise Program: Increased strength;Both right and left upper extremity;With Supervision;With written HEP provided  OT Frequency: Min 2X/week   Barriers to D/C: None known at this time          End of Session Equipment Utilized During Treatment: Other (comment) (QC)  Activity Tolerance: Patient tolerated treatment well Patient left: in chair;with call bell/phone within reach   Time: 1147-1203 OT Time Calculation (min): 16 min Charges:  OT General Charges $OT Visit: 1 Procedure OT Evaluation $Initial OT Evaluation Tier I: 1 Procedure  Kolter Reaver  , MS, OTR/L, CLT Pager: [phone]  01/02/2015, 1:39 PM

## 2015-01-03 LAB — RENAL FUNCTION PANEL
Albumin: 2.1 g/dL — ABNORMAL LOW (ref 3.5–5.2)
Anion gap: 16 — ABNORMAL HIGH (ref 5–15)
BUN: 61 mg/dL — AB (ref 6–23)
CHLORIDE: 89 mmol/L — AB (ref 96–112)
CO2: 24 mmol/L (ref 19–32)
Calcium: 8.7 mg/dL (ref 8.4–10.5)
Creatinine, Ser: 9.29 mg/dL — ABNORMAL HIGH (ref 0.50–1.35)
GFR calc Af Amer: 5 mL/min — ABNORMAL LOW (ref >90)
GFR, EST NON AFRICAN AMERICAN: 5 mL/min — AB (ref >90)
Glucose, Bld: 147 mg/dL — ABNORMAL HIGH (ref 70–99)
Phosphorus: 5.8 mg/dL — ABNORMAL HIGH (ref 2.3–4.6)
Potassium: 3.6 mmol/L (ref 3.5–5.1)
Sodium: 129 mmol/L — ABNORMAL LOW (ref 135–145)

## 2015-01-03 LAB — CBC
HEMATOCRIT: 25.3 % — AB (ref 39.0–52.0)
Hemoglobin: 7.8 g/dL — ABNORMAL LOW (ref 13.0–17.0)
MCH: 29.3 pg (ref 26.0–34.0)
MCHC: 30.8 g/dL (ref 30.0–36.0)
MCV: 95.1 fL (ref 78.0–100.0)
PLATELETS: 291 10*3/uL (ref 150–400)
RBC: 2.66 MIL/uL — ABNORMAL LOW (ref 4.22–5.81)
RDW: 18.3 % — AB (ref 11.5–15.5)
WBC: 7.8 10*3/uL (ref 4.0–10.5)

## 2015-01-03 MED ORDER — NEPRO/CARBSTEADY PO LIQD: 237.0000 mL | ORAL | Status: DC | PRN

## 2015-01-03 MED ORDER — ALTEPLASE 2 MG IJ SOLR: 2.0000 mg | Freq: Once | INTRAMUSCULAR | Status: AC | PRN

## 2015-01-03 MED ORDER — SODIUM CHLORIDE 0.9 % IV SOLN: 100.0000 mL | INTRAVENOUS | Status: DC | PRN

## 2015-01-03 MED ORDER — OXYCODONE-ACETAMINOPHEN 5-325 MG PO TABS
ORAL_TABLET | ORAL | Status: AC
  Filled 2015-01-03: qty 1

## 2015-01-03 MED ORDER — HEPARIN SODIUM (PORCINE) 1000 UNIT/ML DIALYSIS
1000.0000 [IU] | INTRAMUSCULAR | Status: DC | PRN
  Filled 2015-01-03: qty 1

## 2015-01-03 MED ORDER — LIDOCAINE-PRILOCAINE 2.5-2.5 % EX CREA: 1.0000 "application " | TOPICAL_CREAM | CUTANEOUS | Status: DC | PRN

## 2015-01-03 MED ORDER — LISINOPRIL 20 MG PO TABS
20.0000 mg | ORAL_TABLET | Freq: Every day | ORAL | Status: DC
  Administered 2015-01-03 – 2015-01-04 (×2): 20 mg via ORAL
  Filled 2015-01-03 (×3): qty 1

## 2015-01-03 MED ORDER — PENTAFLUOROPROP-TETRAFLUOROETH EX AERO: 1.0000 "application " | INHALATION_SPRAY | CUTANEOUS | Status: DC | PRN

## 2015-01-03 MED ORDER — LIDOCAINE HCL (PF) 1 % IJ SOLN: 5.0000 mL | INTRAMUSCULAR | Status: DC | PRN

## 2015-01-03 MED ORDER — MORPHINE SULFATE 2 MG/ML IJ SOLN
INTRAMUSCULAR | Status: AC
  Filled 2015-01-03: qty 1

## 2015-01-03 MED ORDER — HEPARIN SODIUM (PORCINE) 1000 UNIT/ML DIALYSIS: 20.0000 [IU]/kg | INTRAMUSCULAR | Status: DC | PRN

## 2015-01-03 NOTE — Procedures (Signed)
Tolerating hemodialysis without hemodynamic issues.  Assessment: 1. Cellulitis and ischemic Rt foot 2. ESRD--TTS 3. Sec HPTH on calcitriol 4. Anemia on aranesp 5. HTN 6. PVD 7. 2cm lesion Rt kidney-will need F/U   Silviano Neuser C

## 2015-01-03 NOTE — Care Management Note (Signed)
  CARE MANAGEMENT NOTE 01/03/2015  Patient:  Randy [name], Randy [name]   Account Number:  [id number]  Date Initiated:  01/03/2015  Documentation initiated by:  Ledarius Leeson  Subjective/Objective Assessment:   CM following for progression and d/c planning.     Action/Plan:   Plan to d/c to home with [facility] services and will get second opinion as outpatient.   Anticipated DC Date:  01/03/2015   Anticipated DC Plan:  [place]         Choice offered to / List presented to:          [facility] arranged  HH-2 PT  HH-1 RN      [facility]   Status of service:  Completed, signed off Medicare Important Message given?  YES (If response is "NO", the following Medicare IM given date fields will be blank) Date Medicare IM given:  01/03/2015 Medicare IM given by:  Lakenya Riendeau Date Additional Medicare IM given:   Additional Medicare IM given by:    Discharge Disposition:  [place]  Per UR Regulation:    If discussed at Long Length of Stay Meetings, dates discussed:    Comments:

## 2015-01-03 NOTE — Progress Notes (Signed)
PROGRESS NOTE  Randy [name] OAC:[id number] DOB: Apr 04, 1932 DOA: 12/30/2014 PCP: [name], MD  Assessment/Plan: Right foot pain:  -treated outpatient with Keflex due to suspected cellulitis,without improvement.  -foot is cool, indicating very poor circulation. - had arterial Seg signal doppler study on 12/27/14, which showed evidence of significant peripheral small vessel tibial disease. -IV abx -vascular consult: CT scan shows multilevel arterial occlusive disease. do not think that he has adequate circulation to heal this wound on the right foot. His only option for revascularization would be axillobifemoral bypass and a right infrainguinal bypass. His only runoff is the peroneal artery. Thus this procedure would be associated with significant risk, given his multiple comorbidities, yet he would still be at significant risk for limb loss. would favor primary right above-the-knee amputation if the wounds in the right foot progress and he is agreeable. - patient resistant to amputation for now- spoke with wife and daughter- plan to go home and follow outpatient  Kidney mass:  incidental finding on his CT scan was a 2 cm enhancing mass in the midpole of the left kidney concerning for possible neoplasm or malignancy. MRI: Follow up CT without and with contrast in 1 year suggested to assess the stability of this lesion.  ESRD-HD with hyperkalemia at admission: renal for HD  Hypertension: -continue home medications: Amlodipine, clonidine, hydralazine, lisinopril, Coreg  Hyperlipidemia: -Lipitor  GERD: -Protonix  CAD: Currently no chest pain. EKG has no new change. -Continue aspirin and Lipitor  Code Status: full Family Communication: patient/wife/daughter- spoke of alarming symptoms that would require immediate return to the hospital Disposition Plan: plan to d/c home and follow up outpatient; requested  appointment   Consultants:  vascular  Procedures:      HPI/Subjective: Pain better Does not want amputation currently  Objective: Filed Vitals:   01/03/15 0746  BP: 110/55  Pulse: 67  Temp: 97.8 F (36.6 C)  Resp: 18    Intake/Output Summary (Last 24 hours) at 01/03/15 1025 Last data filed at 01/03/15 0942  Gross per 24 hour  Intake    720 ml  Output      0 ml  Net    720 ml   Filed Weights   01/01/15 2107 01/02/15 0500 01/02/15 2130  Weight: 71.54 kg (157 lb 11.5 oz) 71.54 kg (157 lb 11.5 oz) 76.295 kg (168 lb 3.2 oz)    Exam:   General:  NAD  Cardiovascular: rrr  Respiratory: clear  Abdomen: +BS, soft  Musculoskeletal: no palpable pulse, cold b/l  Data Reviewed: Basic Metabolic Panel:  Recent Labs Lab 12/27/14 1540 12/30/14 1754 12/31/14 0432 12/31/14 1210 01/02/15 0447  NA 137 138 138  --  132*  K 4.5 5.4* 4.5  --  3.7  CL 99 99 98  --  95*  CO2 26 27 22   --  23  GLUCOSE 90 117* 143*  --  118*  BUN 25* 42* 44*  --  39*  CREATININE 5.52* 7.80* 8.03*  --  6.94*  CALCIUM 8.9 8.8 8.6  --  8.6  PHOS  --   --   --  6.7*  --    Liver Function Tests:  Recent Labs Lab 12/30/14 1754 12/31/14 0432  AST 48* 29  ALT 47 41  ALKPHOS 83 65  BILITOT 0.4 0.5  PROT 5.8* 5.9*  ALBUMIN 2.9* 2.7*   No results for input(s): LIPASE, AMYLASE in the last 168 hours. No results for input(s): AMMONIA in the last 168 hours. CBC:  Recent Labs Lab 12/27/14 1540 12/30/14 1754 12/31/14 0432 01/20/2015 0447  WBC 10.4 14.4* 12.2* 9.8  NEUTROABS 8.9* 12.8*  --   --   HGB 9.6* 8.3* 8.3* 8.5*  HCT 30.7* 27.1* 27.9* 28.3*  MCV 99.4 97.8 98.2 98.3  PLT 309 290 295 306   Cardiac Enzymes: No results for input(s): CKTOTAL, CKMB, CKMBINDEX, TROPONINI in the last 168 hours. BNP (last 3 results) No results for input(s): BNP in the last 8760 hours.  ProBNP (last 3 results)  Recent Labs  06/21/14 1046 08/17/14 0625 08/29/14 0928  PROBNP >70000.0*  >70000.0* >70000.0*    CBG: No results for input(s): GLUCAP in the last 168 hours.  Recent Results (from the past 240 hour(s))  Blood culture (routine x 2)     Status: None (Preliminary result)   Collection Time: 12/30/14  5:54 PM  Result Value Ref Range Status   Specimen Description BLOOD RIGHT FOREARM  Final   Special Requests BOTTLES DRAWN AEROBIC AND ANAEROBIC 10CC  Final   Culture   Final           BLOOD CULTURE RECEIVED NO GROWTH TO DATE CULTURE WILL BE HELD FOR 5 DAYS BEFORE ISSUING A FINAL NEGATIVE REPORT Performed at [facility]    Report Status PENDING  Incomplete  Blood culture (routine x 2)     Status: None (Preliminary result)   Collection Time: 12/30/14  6:06 PM  Result Value Ref Range Status   Specimen Description BLOOD HAND RIGHT  Final   Special Requests BOTTLES DRAWN AEROBIC AND ANAEROBIC 5CC  Final   Culture   Final           BLOOD CULTURE RECEIVED NO GROWTH TO DATE CULTURE WILL BE HELD FOR 5 DAYS BEFORE ISSUING A FINAL NEGATIVE REPORT Performed at [facility]    Report Status PENDING  Incomplete  MRSA PCR Screening     Status: None   Collection Time: 12/31/14  1:11 AM  Result Value Ref Range Status   MRSA by PCR NEGATIVE NEGATIVE Final    Comment:        The GeneXpert MRSA Assay (FDA approved for NASAL specimens only), is one component of a comprehensive MRSA colonization surveillance program. It is not intended to diagnose MRSA infection nor to guide or monitor treatment for MRSA infections.      Studies: Mr Abdomen Wo Contrast  20-Jan-2015   CLINICAL DATA:  Enhancing left renal mass on CT angiography. Hemodialysis patient. Initial encounter.  EXAM: MRI ABDOMEN WITHOUT CONTRAST  TECHNIQUE: Multiplanar multisequence MR imaging was performed without the administration of intravenous contrast.  COMPARISON:  Abdominal CTA 12/31/2014.  FINDINGS: Study is mildly motion degraded. No contrast was administered secondary to renal  insufficiency.  Lower chest: Left pleural effusion and left lower lobe atelectasis are again noted. The heart is mildly enlarged.  Hepatobiliary: Decreased T2 signal throughout the liver is likely secondary to iron overload from repeated transfusions. No focal lesions identified. The gallbladder is contracted with mild wall thickening. No evidence of gallstones or biliary dilatation.  Pancreas: Unremarkable. No pancreatic ductal dilatation or surrounding inflammatory changes.  Spleen: Normal in size without focal abnormality.  Adrenals/Urinary Tract: Small right adrenal adenoma demonstrates loss of signal on the opposed phase gradient echo images for the left adrenal gland appears normal.Both kidneys are atrophied with cortical thinning. There are multiple renal cysts bilaterally. The largest cyst is in the interpolar region of the right kidney, measuring 4.4 cm transverse. The lesion  of concern on CT in the anterior interpolar region of the left kidney is best seen on the coronal images and demonstrates mildly decreased T2 and increased T1 signal. It measures approximately 1.5 cm. There is another complex lesion in the upper pole of the left kidney which also demonstrates increased T1 signal, best seen on the coronal images.  Stomach/Bowel: No evidence of bowel wall thickening, distention or surrounding inflammatory change.  Vascular/Lymphatic: There are no enlarged abdominal lymph nodes. Diffuse atherosclerosis, better seen on CTA.  Other: None.  Musculoskeletal: No acute or significant osseous findings.  IMPRESSION: 1. The lesion of concern in the anterior interpolar region of the left kidney is indeterminate on this noncontrast study, demonstrating mildly decreased T2 signal and mildly increased T1 signal. This could reflect a solid mass or complex cyst. Of note, the recent CTA did not include any precontrast images such that enhancement this lesion is not confirmed. Ultrasound is unlikely to resolve this  finding. Follow up CT without and with contrast in 1 year suggested to assess the stability of this lesion. 2. There are additional simple and mildly complex renal cysts bilaterally. 3. Stable small right adrenal adenoma.   Electronically Signed   By: Richardean Sale M.D.   On: 01/02/2015 08:33    Scheduled Meds: . amLODipine  10 mg Oral Daily  . aspirin  325 mg Oral Daily  . atorvastatin  40 mg Oral q1800  . calcitRIOL  0.5 mcg Oral Daily  . carvedilol  25 mg Oral BID WC  . cloNIDine  0.2 mg Oral BID  . darbepoetin (ARANESP) injection - DIALYSIS  100 mcg Intravenous Q Sat-HD  . feeding supplement (RESOURCE BREEZE)  1 Container Oral TID BM  . heparin  5,000 Units Subcutaneous 3 times per day  . lisinopril  20 mg Oral Daily  . multivitamin  1 tablet Oral Daily  . pantoprazole  40 mg Oral Daily  . piperacillin-tazobactam (ZOSYN)  IV  2.25 g Intravenous Q8H  . predniSONE  10 mg Oral Q breakfast  . rOPINIRole  0.25-0.5 mg Oral QHS  . sevelamer carbonate  4,000 mg Oral TID WC  . sodium chloride  3 mL Intravenous Q12H  . vancomycin  750 mg Intravenous Q T,Th,Sa-HD   Continuous Infusions:  Antibiotics Given (last 72 hours)    Date/Time Action Medication Dose Rate   12/31/14 2202 Given   piperacillin-tazobactam (ZOSYN) IVPB 2.25 g 2.25 g 100 mL/hr   01/01/15 0602 Given   piperacillin-tazobactam (ZOSYN) IVPB 2.25 g 2.25 g 100 mL/hr   01/01/15 1057 Given   vancomycin (VANCOCIN) IVPB 750 mg/150 ml premix 750 mg 150 mL/hr   01/01/15 1328 Given   vancomycin (VANCOCIN) IVPB 750 mg/150 ml premix 750 mg 150 mL/hr   01/01/15 1545 Given   piperacillin-tazobactam (ZOSYN) IVPB 2.25 g 2.25 g 100 mL/hr   01/01/15 2111 Given   piperacillin-tazobactam (ZOSYN) IVPB 2.25 g 2.25 g 100 mL/hr   01/02/15 0550 Given   piperacillin-tazobactam (ZOSYN) IVPB 2.25 g 2.25 g 100 mL/hr   01/02/15 1420 Given   piperacillin-tazobactam (ZOSYN) IVPB 2.25 g 2.25 g 100 mL/hr   01/02/15 2125 Given    piperacillin-tazobactam (ZOSYN) IVPB 2.25 g 2.25 g 100 mL/hr   01/03/15 0624 Given   piperacillin-tazobactam (ZOSYN) IVPB 2.25 g 2.25 g 100 mL/hr      Principal Problem:   Foot pain, right Active Problems:   HLD (hyperlipidemia)   Polycystic kidney   Lower GI bleed   ESRD (end  stage renal disease) on dialysis   Foot pain   CHF (congestive heart failure)   CAD (coronary artery disease)   Hyperkalemia   Renal mass    Time spent: 25 min    Eliseo Squires Edessa Jakubowicz  [facility] Pager [phone] If 7PM-7AM, please contact night-coverage at [email or web address], password [email or web address] 01/03/2015, 10:25 AM  LOS: 4 days

## 2015-01-03 NOTE — Progress Notes (Signed)
   VASCULAR SURGERY ASSESSMENT & PLAN:  * I again have recommended right BKA or AKA. He does not have adequate circulation to heel the right foot wounds. He is not a candidate for revascularization given his diffuse multilevel disease with peroneal runoff only. He seems more receptive today. I tried to call his daughter [name] [phone]), and left a massage.  * I am in the office tomorrow. If he is agreeable to surgery, I could schedule for Thursday or Friday. If he is not agreeable, I will follow the foot as an outpt and proceed if/when he is agreeable.   SUBJECTIVE: Pain tolerable.  PHYSICAL EXAM: Filed Vitals:   01/02/15 1649 01/02/15 2130 01/03/15 0456 01/03/15 0746  BP: 111/48 118/55 124/60 110/55  Pulse: 70 71 99 67  Temp: 98 F (36.7 C) 98.1 F (36.7 C) 98.3 F (36.8 C) 97.8 F (36.6 C)  TempSrc: Oral Oral Oral Oral  Resp: 18 18 16 18   Height:      Weight:  168 lb 3.2 oz (76.295 kg)    SpO2: 99% 100% 99% 98%   2nd, 3rd, 4th toes right foot gradually worsening.   LABS: Lab Results  Component Value Date   WBC 9.8 01/02/2015   HGB 8.5* 01/02/2015   HCT 28.3* 01/02/2015   MCV 98.3 01/02/2015   PLT 306 01/02/2015   Lab Results  Component Value Date   CREATININE 6.94* 01/02/2015   Lab Results  Component Value Date   INR 1.12 12/30/2014   CBG (last 3)  No results for input(s): GLUCAP in the last 72 hours.  Principal Problem:   Foot pain, right Active Problems:   HLD (hyperlipidemia)   Polycystic kidney   Lower GI bleed   ESRD (end stage renal disease) on dialysis   Foot pain   CHF (congestive heart failure)   CAD (coronary artery disease)   Hyperkalemia   Renal mass    [name] Beeper: [phone] 01/03/2015

## 2015-01-03 NOTE — Progress Notes (Signed)
Pharmacy Medication Therapy Review   Total Number of meds on admission: 15 (polypharmacy > 10 meds)  Indications for all medications: [x]  Yes       []  No  Adherence Review  []  Excellent (no doses missed/week)     []  Good (no more than 1 dose missed/week)     [x]  Partial (2-3 doses missed/week)     []  Poor (>3 doses missed/week)  Total number of high risk medications: 0 (Anticoagulants, Dual antiplatelets, oral Antihyperglycemic agents,Insulins, Antipsychotics, Anti-Seizure meds, Inhalers, HF/ACS meds, Antibiotics and HIV medications)   Assessment: (Medication related problems)  Intervention  YES NO  Explanation   Indications      Medication without noted indication []  [x]     Indication without noted medication []  [x]     Duplicate therapy [x]  []  2 beta blockers on PTA med list - carvedilol and labetalol  Efficacy      Suboptimal drug or dose selection []  [x]    Insufficient dose/duration []  [x]    Failure to receive therapy  (Rx not filled) []  [x]     Safety      Adverse drug event [x]  []  Pt reports not always taking his BP medications at home because his BP will drop too low. PTA meds include: amlodipine, carvedilol, clonidine, hydralazine, labetalol, lisinopril.    Drug interaction []  [x]     Excessive dose/duration []  [x]    High-risk medications []  [x]    Compliance     Underuse [x]  []  Pt does not have great recall on what medications he takes or how frequently he takes them.   Overuse []  [x]    Other pertinent pharmacist counseling [x]  []  Discussed how to better organize his medications at home to help with adherence. States that his wife helps him as well. Will try to reduce pill burden to make compliance easier.    Total number of new medications upon discharge: 0  Time:  Time spent preparing for discharge counseling: 15 Time spent counseling patient: 10 Additional time spent on discharge: 15   PLAN:  Following recommendations discussed with and accepted by Dr. Eliseo Squires: -  Discontinue hydralazine TID to help reduce pill burden - Increase lisinopril to 20mg  daily - Discharge patient on carvedilol, do not resume duplicate home labetalol  Megan E. Supple, Pharm.D Clinical Pharmacy Resident Pager: [phone] 01/03/2015 10:05 AM

## 2015-01-04 ENCOUNTER — Encounter (HOSPITAL_COMMUNITY): Payer: Medicare Other

## 2015-01-04 ENCOUNTER — Encounter: Payer: Medicare Other | Admitting: Vascular Surgery

## 2015-01-04 LAB — TYPE AND SCREEN
ABO/RH(D): O POS
ANTIBODY SCREEN: NEGATIVE

## 2015-01-04 LAB — BASIC METABOLIC PANEL
Anion gap: 12 (ref 5–15)
BUN: 23 mg/dL (ref 6–23)
CHLORIDE: 94 mmol/L — AB (ref 96–112)
CO2: 27 mmol/L (ref 19–32)
CREATININE: 4.73 mg/dL — AB (ref 0.50–1.35)
Calcium: 8.2 mg/dL — ABNORMAL LOW (ref 8.4–10.5)
GFR, EST AFRICAN AMERICAN: 12 mL/min — AB (ref >90)
GFR, EST NON AFRICAN AMERICAN: 10 mL/min — AB (ref >90)
Glucose, Bld: 135 mg/dL — ABNORMAL HIGH (ref 70–99)
Potassium: 3 mmol/L — ABNORMAL LOW (ref 3.5–5.1)
Sodium: 133 mmol/L — ABNORMAL LOW (ref 135–145)

## 2015-01-04 LAB — CBC
HEMATOCRIT: 26.4 % — AB (ref 39.0–52.0)
Hemoglobin: 8.2 g/dL — ABNORMAL LOW (ref 13.0–17.0)
MCH: 29.9 pg (ref 26.0–34.0)
MCHC: 31.1 g/dL (ref 30.0–36.0)
MCV: 96.4 fL (ref 78.0–100.0)
PLATELETS: 303 10*3/uL (ref 150–400)
RBC: 2.74 MIL/uL — ABNORMAL LOW (ref 4.22–5.81)
RDW: 18.5 % — AB (ref 11.5–15.5)
WBC: 7.9 10*3/uL (ref 4.0–10.5)

## 2015-01-04 LAB — SURGICAL PCR SCREEN
MRSA, PCR: NEGATIVE
Staphylococcus aureus: NEGATIVE

## 2015-01-04 MED ORDER — SODIUM CHLORIDE 0.9 % IV SOLN: 100.0000 mL | INTRAVENOUS | Status: DC | PRN

## 2015-01-04 MED ORDER — HEPARIN SODIUM (PORCINE) 1000 UNIT/ML DIALYSIS: 20.0000 [IU]/kg | INTRAMUSCULAR | Status: DC | PRN

## 2015-01-04 MED ORDER — PENTAFLUOROPROP-TETRAFLUOROETH EX AERO: 1.0000 "application " | INHALATION_SPRAY | CUTANEOUS | Status: DC | PRN

## 2015-01-04 MED ORDER — LIDOCAINE-PRILOCAINE 2.5-2.5 % EX CREA
1.0000 "application " | TOPICAL_CREAM | CUTANEOUS | Status: DC | PRN
  Filled 2015-01-04: qty 5

## 2015-01-04 MED ORDER — OXYCODONE-ACETAMINOPHEN 5-325 MG PO TABS
ORAL_TABLET | ORAL | Status: AC
  Filled 2015-01-04: qty 1

## 2015-01-04 MED ORDER — NEPRO/CARBSTEADY PO LIQD
237.0000 mL | ORAL | Status: DC | PRN
  Filled 2015-01-04: qty 237

## 2015-01-04 MED ORDER — VANCOMYCIN HCL IN DEXTROSE 750-5 MG/150ML-% IV SOLN
750.0000 mg | INTRAVENOUS | Status: DC
  Filled 2015-01-04: qty 150

## 2015-01-04 MED ORDER — MORPHINE SULFATE 2 MG/ML IJ SOLN
INTRAMUSCULAR | Status: AC
  Filled 2015-01-04: qty 1

## 2015-01-04 MED ORDER — VANCOMYCIN HCL IN DEXTROSE 750-5 MG/150ML-% IV SOLN
750.0000 mg | INTRAVENOUS | Status: DC | PRN
  Filled 2015-01-04: qty 150

## 2015-01-04 MED ORDER — HEPARIN SODIUM (PORCINE) 1000 UNIT/ML DIALYSIS: 1000.0000 [IU] | INTRAMUSCULAR | Status: DC | PRN

## 2015-01-04 MED ORDER — LIDOCAINE HCL (PF) 1 % IJ SOLN: 5.0000 mL | INTRAMUSCULAR | Status: DC | PRN

## 2015-01-04 MED ORDER — ALTEPLASE 2 MG IJ SOLR
2.0000 mg | Freq: Once | INTRAMUSCULAR | Status: DC | PRN
  Filled 2015-01-04: qty 2

## 2015-01-04 MED ORDER — VANCOMYCIN HCL IN DEXTROSE 750-5 MG/150ML-% IV SOLN
750.0000 mg | INTRAVENOUS | Status: AC
  Administered 2015-01-04: 750 mg via INTRAVENOUS
  Filled 2015-01-04: qty 150

## 2015-01-04 NOTE — Progress Notes (Signed)
Occupational Therapy Treatment Patient Details Name: Randy [name] MRN: [id number] DOB: 1932-04-20 Today's Date: 01/04/2015    History of present illness Patient is an 81 yo male admitted 12/30/14 with Rt foot pain and cellulitis.  Patient had recent fall and injured Rt foot.  Now in process of vascular surgery work-up for peripheral vascular disease.  PMH:  HLD, HTN, ESRD now on HD, CHF, CAD, cardiomyopathy, NSTEMI   OT comments  Pt with increased pain in R foot, declining OOB activity. RN gave pt pain meds.  Focus of session on UB exercise using level 2 theraband doubled.  Instructed pt in the importance of a strong upper body as pt has agreed to a BKA with surgery scheduled for tomorrow. Pt tolerated exercises well.  Instructed pt to do exercises 2 more times today.  Follow Up Recommendations   (TBD after BKA 01/05/15)    Equipment Recommendations       Recommendations for Other Services      Precautions / Restrictions Precautions Precautions: Fall       Mobility Bed Mobility                  Transfers                      Balance                                   ADL                                                Vision                     Perception     Praxis      Cognition   Behavior During Therapy: WFL for tasks assessed/performed Overall Cognitive Status: Within Functional Limits for tasks assessed                       Extremity/Trunk Assessment               Exercises General Exercises - Upper Extremity Shoulder Flexion: Strengthening;Both;10 reps;Supine;Theraband Theraband Level (Shoulder Flexion): Level 2 (Red) Shoulder Extension: Strengthening;Both;10 reps;Supine;Theraband Theraband Level (Shoulder Extension): Level 2 (Red) Shoulder ABduction: Strengthening;Both;10 reps;Supine;Theraband Theraband Level (Shoulder Abduction): Level 2 (Red) Shoulder Horizontal ABduction:  Strengthening;Both;10 reps;Supine;Theraband Theraband Level (Shoulder Horizontal Abduction): Level 2 (Red) Elbow Flexion: Strengthening;Both;10 reps;Supine;Theraband Theraband Level (Elbow Flexion): Level 2 (Red) Elbow Extension: Strengthening;Both;10 reps;Supine;Theraband Theraband Level (Elbow Extension): Level 2 (Red)   Shoulder Instructions       General Comments      Pertinent Vitals/ Pain       Pain Assessment: Faces Faces Pain Scale: Hurts whole lot Pain Location: R foot Pain Descriptors / Indicators: Aching Pain Intervention(s): Limited activity within patient's tolerance;Monitored during session;Premedicated before session;Repositioned  Home Living                                          Prior Functioning/Environment              Frequency Min 2X/week     Progress Toward Goals  OT Goals(current goals can  now be found in the care plan section)  Progress towards OT goals: Progressing toward goals  Acute Rehab OT Goals Patient Stated Goal: pain free Time For Goal Achievement: 01/09/15 Potential to Achieve Goals: Good  Plan Discharge plan needs to be updated (will update after surgery 01/05/15)    Co-evaluation                 End of Session     Activity Tolerance No increased pain   Patient Left in bed;with call bell/phone within reach   Nurse Communication          Time: 0813-8871 OT Time Calculation (min): 16 min  Charges: OT General Charges $OT Visit: 1 Procedure OT Treatments $Therapeutic Exercise: 8-22 mins  [name] 01/04/2015, 10:24 AM  [phone]

## 2015-01-04 NOTE — Progress Notes (Signed)
PROGRESS NOTE  Randy [name]:[id number] DOB: 03-18-1932 DOA: 12/30/2014 PCP: [name], MD  Assessment/Plan: Right foot pain:  -treated outpatient with Keflex due to suspected cellulitis,without improvement.  -foot is cool, indicating very poor circulation. - had arterial Seg signal doppler study on 12/27/14, which showed evidence of significant peripheral small vessel tibial disease. -IV abx, continue with Vancomycin.  -vascular consult: Plan for Right Above Knee amputation 4-14.  Kidney mass:  incidental finding on his CT scan was a 2 cm enhancing mass in the midpole of the left kidney concerning for possible neoplasm or malignancy. MRI: Follow up CT without and with contrast in 1 year suggested to assess the stability of this lesion.  ESRD-HD with hyperkalemia at admission: renal for HD Consider Blood transfusion during dialysis prior to surgery.   Hypertension: -continue home medications: Amlodipine, clonidine, hydralazine, lisinopril, Coreg  Hyperlipidemia: -Lipitor  GERD: -Protonix  CAD: Currently no chest pain. EKG has no new change. -Continue aspirin and Lipitor  Code Status: full Family Communication: patient/  Disposition Plan: surgery 4-14   Consultants:  vascular  Procedures:      HPI/Subjective: Still with right foot pain. He agree to proceed with surgery.    Objective: Filed Vitals:   01/04/15 0758  BP: 118/57  Pulse: 67  Temp: 98 F (36.7 C)  Resp: 21    Intake/Output Summary (Last 24 hours) at 01/04/15 1605 Last data filed at 01/04/15 1448  Gross per 24 hour  Intake    942 ml  Output      0 ml  Net    942 ml   Filed Weights   01/03/15 1050 01/03/15 1508 01/03/15 2140  Weight: 76.8 kg (169 lb 5 oz) 74.6 kg (164 lb 7.4 oz) 75.841 kg (167 lb 3.2 oz)    Exam:   General:  NAD  Cardiovascular: rrr  Respiratory: clear  Abdomen: +BS, soft  Musculoskeletal: no palpable pulse, cold b/l  Data Reviewed: Basic Metabolic  Panel:  Recent Labs Lab 12/30/14 1754 12/31/14 0432 12/31/14 1210 01/02/15 0447 01/03/15 1115  NA 138 138  --  132* 129*  K 5.4* 4.5  --  3.7 3.6  CL 99 98  --  95* 89*  CO2 27 22  --  23 24  GLUCOSE 117* 143*  --  118* 147*  BUN 42* 44*  --  39* 61*  CREATININE 7.80* 8.03*  --  6.94* 9.29*  CALCIUM 8.8 8.6  --  8.6 8.7  PHOS  --   --  6.7*  --  5.8*   Liver Function Tests:  Recent Labs Lab 12/30/14 1754 12/31/14 0432 01/03/15 1115  AST 48* 29  --   ALT 47 41  --   ALKPHOS 83 65  --   BILITOT 0.4 0.5  --   PROT 5.8* 5.9*  --   ALBUMIN 2.9* 2.7* 2.1*   No results for input(s): LIPASE, AMYLASE in the last 168 hours. No results for input(s): AMMONIA in the last 168 hours. CBC:  Recent Labs Lab 12/30/14 1754 12/31/14 0432 01/02/15 0447 01/03/15 1114  WBC 14.4* 12.2* 9.8 7.8  NEUTROABS 12.8*  --   --   --   HGB 8.3* 8.3* 8.5* 7.8*  HCT 27.1* 27.9* 28.3* 25.3*  MCV 97.8 98.2 98.3 95.1  PLT 290 295 306 291   Cardiac Enzymes: No results for input(s): CKTOTAL, CKMB, CKMBINDEX, TROPONINI in the last 168 hours. BNP (last 3 results) No results for input(s): BNP in the  last 8760 hours.  ProBNP (last 3 results)  Recent Labs  06/21/14 1046 08/17/14 0625 08/29/14 0928  PROBNP >70000.0* >70000.0* >70000.0*    CBG: No results for input(s): GLUCAP in the last 168 hours.  Recent Results (from the past 240 hour(s))  Blood culture (routine x 2)     Status: None (Preliminary result)   Collection Time: 12/30/14  5:54 PM  Result Value Ref Range Status   Specimen Description BLOOD RIGHT FOREARM  Final   Special Requests BOTTLES DRAWN AEROBIC AND ANAEROBIC 10CC  Final   Culture   Final           BLOOD CULTURE RECEIVED NO GROWTH TO DATE CULTURE WILL BE HELD FOR 5 DAYS BEFORE ISSUING A FINAL NEGATIVE REPORT Performed at [facility]    Report Status PENDING  Incomplete  Blood culture (routine x 2)     Status: None (Preliminary result)   Collection Time:  12/30/14  6:06 PM  Result Value Ref Range Status   Specimen Description BLOOD HAND RIGHT  Final   Special Requests BOTTLES DRAWN AEROBIC AND ANAEROBIC 5CC  Final   Culture   Final           BLOOD CULTURE RECEIVED NO GROWTH TO DATE CULTURE WILL BE HELD FOR 5 DAYS BEFORE ISSUING A FINAL NEGATIVE REPORT Performed at [facility]    Report Status PENDING  Incomplete  MRSA PCR Screening     Status: None   Collection Time: 12/31/14  1:11 AM  Result Value Ref Range Status   MRSA by PCR NEGATIVE NEGATIVE Final    Comment:        The GeneXpert MRSA Assay (FDA approved for NASAL specimens only), is one component of a comprehensive MRSA colonization surveillance program. It is not intended to diagnose MRSA infection nor to guide or monitor treatment for MRSA infections.   Surgical pcr screen     Status: None   Collection Time: 01/04/15 10:54 AM  Result Value Ref Range Status   MRSA, PCR NEGATIVE NEGATIVE Final   Staphylococcus aureus NEGATIVE NEGATIVE Final    Comment:        The Xpert SA Assay (FDA approved for NASAL specimens in patients over 79 years of age), is one component of a comprehensive surveillance program.  Test performance has been validated by [facility] for patients greater than or equal to 79 year old. It is not intended to diagnose infection nor to guide or monitor treatment.      Studies: No results found.  Scheduled Meds: . amLODipine  10 mg Oral Daily  . aspirin  325 mg Oral Daily  . atorvastatin  40 mg Oral q1800  . calcitRIOL  0.5 mcg Oral Daily  . carvedilol  25 mg Oral BID WC  . cloNIDine  0.2 mg Oral BID  . darbepoetin (ARANESP) injection - DIALYSIS  100 mcg Intravenous Q Sat-HD  . feeding supplement (RESOURCE BREEZE)  1 Container Oral TID BM  . heparin  5,000 Units Subcutaneous 3 times per day  . lisinopril  20 mg Oral Daily  . multivitamin  1 tablet Oral Daily  . pantoprazole  40 mg Oral Daily  . piperacillin-tazobactam (ZOSYN)  IV   2.25 g Intravenous Q8H  . rOPINIRole  0.25-0.5 mg Oral QHS  . sevelamer carbonate  4,000 mg Oral TID WC  . sodium chloride  3 mL Intravenous Q12H  . vancomycin  750 mg Intravenous To Hemo  . [START ON 01/07/2015]  vancomycin  750 mg Intravenous Q T,Th,Sa-HD   Continuous Infusions:  Antibiotics Given (last 72 hours)    Date/Time Action Medication Dose Rate   01/01/15 2111 Given   piperacillin-tazobactam (ZOSYN) IVPB 2.25 g 2.25 g 100 mL/hr   01/02/15 0550 Given   piperacillin-tazobactam (ZOSYN) IVPB 2.25 g 2.25 g 100 mL/hr   01/02/15 1420 Given   piperacillin-tazobactam (ZOSYN) IVPB 2.25 g 2.25 g 100 mL/hr   01/02/15 2125 Given   piperacillin-tazobactam (ZOSYN) IVPB 2.25 g 2.25 g 100 mL/hr   01/03/15 0624 Given   piperacillin-tazobactam (ZOSYN) IVPB 2.25 g 2.25 g 100 mL/hr   01/03/15 1200 Given  [Given in Dialysis]   vancomycin (VANCOCIN) IVPB 750 mg/150 ml premix 750 mg 150 mL/hr   01/03/15 1630 Given   piperacillin-tazobactam (ZOSYN) IVPB 2.25 g 2.25 g 100 mL/hr   01/03/15 2217 Given   piperacillin-tazobactam (ZOSYN) IVPB 2.25 g 2.25 g 100 mL/hr   01/04/15 0549 Given   piperacillin-tazobactam (ZOSYN) IVPB 2.25 g 2.25 g 100 mL/hr   01/04/15 1350 Given   piperacillin-tazobactam (ZOSYN) IVPB 2.25 g 2.25 g 100 mL/hr      Principal Problem:   Foot pain, right Active Problems:   HLD (hyperlipidemia)   Polycystic kidney   Lower GI bleed   ESRD (end stage renal disease) on dialysis   Foot pain   CHF (congestive heart failure)   CAD (coronary artery disease)   Hyperkalemia   Renal mass    Time spent: 35 min    Leavy Heatherly A  [facility] Pager [phone] If 7PM-7AM, please contact night-coverage at [email or web address], password [facility] 01/04/2015, 4:05 PM  LOS: 5 days

## 2015-01-04 NOTE — Progress Notes (Signed)
Physical Therapy Treatment Patient Details Name: Randy [name] MRN: [id number] DOB: December 25, 1931 Today's Date: 01/04/2015    History of Present Illness Patient is an 81 yo male admitted 12/30/14 with Rt foot pain and cellulitis.  Patient had recent fall and injured Rt foot.  Following vascular surgery work-up for peripheral vascular disease, pt is awaiting surgery for BKA (4/14).  PMH:  HLD, HTN, ESRD now on HD, CHF, CAD, cardiomyopathy, NSTEMI    PT Comments    Pt was requesting pain meds for R foot pain when SPT entered room. RN gave pt pain meds during PT treat. PT session focused on UE strengthening and functional mobility, including single limb balance and single limb ambulation with 2wRW.  Pt tolerated exercises and ambulation well. PT re-eval will be necessary following BKA scheduled for tomorrow (4/14).   Follow Up Recommendations   TBD after BKA 01/05/15.     Equipment Recommendations  None recommended by PT    Recommendations for Other Services       Precautions / Restrictions Precautions Precautions: Fall Restrictions Weight Bearing Restrictions: No    Mobility  Bed Mobility Overal bed mobility: Needs Assistance Bed Mobility: Supine to Sit     Supine to sit: Supervision        Transfers Overall transfer level: Needs assistance Equipment used: Rolling walker (2 wheeled) Transfers: Sit to/from Stand Sit to Stand: Min guard         General transfer comment: Verbal cues for hand placement. Assist to steady during transition. Used RW instead of quad cane in order to work on single limb balance exercises.  Ambulation/Gait Ambulation/Gait assistance: Min guard Ambulation Distance (Feet): 15 Feet Assistive device: Rolling walker (2 wheeled)       General Gait Details: Pt instructed to ambulate with NWB of R LE in order to practice for his BKA scheduled for tomorrow. Demonstrated the activity well, but fatigued quickly after already performing UE ther  ex.   Stairs            Wheelchair Mobility    Modified Rankin (Stroke Patients Only)       Balance Overall balance assessment: Needs assistance Sitting-balance support: Feet unsupported;No upper extremity supported Sitting balance-Leahy Scale: Good     Standing balance support: Bilateral upper extremity supported;During functional activity Standing balance-Leahy Scale: Fair                      Cognition Arousal/Alertness: Awake/alert Behavior During Therapy: WFL for tasks assessed/performed Overall Cognitive Status: Within Functional Limits for tasks assessed                      Exercises General Exercises - Upper Extremity Shoulder Flexion: Strengthening;Both;5 reps;Theraband;Seated Theraband Level (Shoulder Flexion): Level 2 (Red) Shoulder ABduction: Strengthening;Both;10 reps;Theraband;Seated Theraband Level (Shoulder Abduction): Level 2 (Red) Shoulder Horizontal ADduction: Strengthening;Both;10 reps;Theraband;Seated Theraband Level (Shoulder Horizontal Adduction): Level 2 (Red) Elbow Flexion: Strengthening;Both;5 reps;Theraband;Seated Theraband Level (Elbow Flexion): Level 2 (Red) Chair Push Up: Strengthening;Both;Seated;Other (comment) (2 reps after being seated in chair at end of session) General Exercises - Lower Extremity Quad Sets: Strengthening;Both;10 reps;Seated Tricep Press ups at RW: Strengthening; Both; 10 reps; Standing    General Comments While waiting on pain meds from nurse, pt demonstrated some of the UE exercises given to him from OT this morning. He required some promtpting for correct positioning and had not yet done the exercises since OT session.      Pertinent Vitals/Pain Pain Assessment: 0-10  Pain Score: 8  (8 before meds, 6 with foot off bed, 0 at end of session) Pain Location: R foot Pain Intervention(s): Limited activity within patient's tolerance;Monitored during session;RN gave pain meds during session     Home Living                      Prior Function            PT Goals (current goals can now be found in the care plan section) Acute Rehab PT Goals PT Goal Formulation: With patient Time For Goal Achievement: 01/18/15 Potential to Achieve Goals: Good Progress towards PT goals: Progressing toward goals    Frequency  Min 3X/week    PT Plan Current plan remains appropriate    Co-evaluation             End of Session Equipment Utilized During Treatment: Gait belt Activity Tolerance: Patient tolerated treatment well;Patient limited by fatigue Patient left: in chair;with call bell/phone within reach;with chair alarm set     Time: 0238-0302 PT Time Calculation (min) (ACUTE ONLY): 24 min  Charges:                       G CodesShawna Orleans 2015-01-16, 3:37 PM   Shawna Orleans, [place] Office: [phone]

## 2015-01-04 NOTE — Progress Notes (Signed)
Assessment: 1. Cellulitis and ischemic Rt foot 2. ESRD--Plan for today off sched per request of [facility] 3. Sec HPTH on calcitriol 4. Anemia on aranesp 5. HTN 6. PVD 7. 2cm lesion Rt kidney  Subjective: Interval History:pain in leg  Objective: Vital signs in last 24 hours: Temp:  [98 F (36.7 C)-98.8 F (37.1 C)] 98 F (36.7 C) (04/13 0758) Pulse Rate:  [67-80] 67 (04/13 0758) Resp:  [20-22] 21 (04/13 0758) BP: (118-144)/(57-75) 118/57 mmHg (04/13 0758) SpO2:  [96 %-100 %] 100 % (04/13 0758) Weight:  [74.6 kg (164 lb 7.4 oz)-75.841 kg (167 lb 3.2 oz)] 75.841 kg (167 lb 3.2 oz) (04/12 2140) Weight change: 0.505 kg (1 lb 1.8 oz)  Intake/Output from previous day: 04/12 0701 - 04/13 0700 In: 702 [P.O.:702] Out: 2504  Intake/Output this shift: Total I/O In: 240 [P.O.:240] Out: 0   General appearance: alert and cooperative Resp: clear to auscultation bilaterally Cardio: regular rate and rhythm, S1, S2 normal, no murmur, click, rub or gallop GI: soft, non-tender; bowel sounds normal; no masses,  no organomegaly  Lab Results:  Recent Labs  01/02/15 0447 01/03/15 1114  WBC 9.8 7.8  HGB 8.5* 7.8*  HCT 28.3* 25.3*  PLT 306 291   BMET:  Recent Labs  01/02/15 0447 01/03/15 1115  NA 132* 129*  K 3.7 3.6  CL 95* 89*  CO2 23 24  GLUCOSE 118* 147*  BUN 39* 61*  CREATININE 6.94* 9.29*  CALCIUM 8.6 8.7   No results for input(s): PTH in the last 72 hours. Iron Studies: No results for input(s): IRON, TIBC, TRANSFERRIN, FERRITIN in the last 72 hours. Studies/Results: No results found.  Scheduled: . amLODipine  10 mg Oral Daily  . aspirin  325 mg Oral Daily  . atorvastatin  40 mg Oral q1800  . calcitRIOL  0.5 mcg Oral Daily  . carvedilol  25 mg Oral BID WC  . cloNIDine  0.2 mg Oral BID  . darbepoetin (ARANESP) injection - DIALYSIS  100 mcg Intravenous Q Sat-HD  . feeding supplement (RESOURCE BREEZE)  1 Container Oral TID BM  . heparin  5,000 Units Subcutaneous 3  times per day  . lisinopril  20 mg Oral Daily  . multivitamin  1 tablet Oral Daily  . pantoprazole  40 mg Oral Daily  . piperacillin-tazobactam (ZOSYN)  IV  2.25 g Intravenous Q8H  . rOPINIRole  0.25-0.5 mg Oral QHS  . sevelamer carbonate  4,000 mg Oral TID WC  . sodium chloride  3 mL Intravenous Q12H  . vancomycin  750 mg Intravenous Q T,Th,Sa-HD     LOS: 5 days   Randy [name] C 01/04/2015,2:19 PM

## 2015-01-04 NOTE — Progress Notes (Signed)
   VASCULAR SURGERY ASSESSMENT & PLAN:  * Patient now agreeable to right BKA. Will schedule for tomorrow.   * If possible, please do HD today instead of tomorrow, or early Thursday AM (surgery around noon)  * Procedure and risks discussed with patient.  SUBJECTIVE: Pain right foot worse.  PHYSICAL EXAM: Filed Vitals:   01/03/15 1508 01/03/15 1700 01/03/15 2140 01/04/15 0510  BP: 144/75 142/66 126/68 133/65  Pulse: 71 80 79 78  Temp: 98.3 F (36.8 C) 98.2 F (36.8 C) 98 F (36.7 C) 98.8 F (37.1 C)  TempSrc: Oral Oral Oral Oral  Resp: 21  22 20   Height:      Weight: 164 lb 7.4 oz (74.6 kg)  167 lb 3.2 oz (75.841 kg)   SpO2: 96% 96% 98% 99%   Toes right foot look worse.   LABS: Lab Results  Component Value Date   WBC 7.8 01/03/2015   HGB 7.8* 01/03/2015   HCT 25.3* 01/03/2015   MCV 95.1 01/03/2015   PLT 291 01/03/2015   Lab Results  Component Value Date   CREATININE 9.29* 01/03/2015   Lab Results  Component Value Date   INR 1.12 12/30/2014    Principal Problem:   Foot pain, right Active Problems:   HLD (hyperlipidemia)   Polycystic kidney   Lower GI bleed   ESRD (end stage renal disease) on dialysis   Foot pain   CHF (congestive heart failure)   CAD (coronary artery disease)   Hyperkalemia   Renal mass   [name] Beeper: [phone] 01/04/2015

## 2015-01-04 NOTE — Progress Notes (Signed)
ANTIBIOTIC CONSULT NOTE - FOLLOW UP  Pharmacy Consult for Vanc/Zosyn Indication: cellulitis - failed outpatient abx  No Known Allergies  Patient Measurements: Height: 5\' 9"  (175.3 cm) Weight: 167 lb 3.2 oz (75.841 kg) IBW/kg (Calculated) : 70.7  Vital Signs: Temp: 98 F (36.7 C) (04/13 0758) Temp Source: Oral (04/13 0758) BP: 118/57 mmHg (04/13 0758) Pulse Rate: 67 (04/13 0758) Intake/Output from previous day: 04/12 0701 - 04/13 0700 In: 702 [P.O.:702] Out: 2504  Intake/Output from this shift: Total I/O In: 480 [P.O.:480] Out: 0   Labs:  Recent Labs  01/02/15 0447 01/03/15 1114 01/03/15 1115  WBC 9.8 7.8  --   HGB 8.5* 7.8*  --   PLT 306 291  --   CREATININE 6.94*  --  9.29*   Estimated Creatinine Clearance: 6.2 mL/min (by C-G formula based on Cr of 9.29). No results for input(s): VANCOTROUGH, VANCOPEAK, VANCORANDOM, GENTTROUGH, GENTPEAK, GENTRANDOM, TOBRATROUGH, TOBRAPEAK, TOBRARND, AMIKACINPEAK, AMIKACINTROU, AMIKACIN in the last 72 hours.   Microbiology: Recent Results (from the past 720 hour(s))  Blood culture (routine x 2)     Status: None (Preliminary result)   Collection Time: 12/30/14  5:54 PM  Result Value Ref Range Status   Specimen Description BLOOD RIGHT FOREARM  Final   Special Requests BOTTLES DRAWN AEROBIC AND ANAEROBIC 10CC  Final   Culture   Final           BLOOD CULTURE RECEIVED NO GROWTH TO DATE CULTURE WILL BE HELD FOR 5 DAYS BEFORE ISSUING A FINAL NEGATIVE REPORT Performed at [facility]    Report Status PENDING  Incomplete  Blood culture (routine x 2)     Status: None (Preliminary result)   Collection Time: 12/30/14  6:06 PM  Result Value Ref Range Status   Specimen Description BLOOD HAND RIGHT  Final   Special Requests BOTTLES DRAWN AEROBIC AND ANAEROBIC 5CC  Final   Culture   Final           BLOOD CULTURE RECEIVED NO GROWTH TO DATE CULTURE WILL BE HELD FOR 5 DAYS BEFORE ISSUING A FINAL NEGATIVE REPORT Performed at [facility]    Report Status PENDING  Incomplete  MRSA PCR Screening     Status: None   Collection Time: 12/31/14  1:11 AM  Result Value Ref Range Status   MRSA by PCR NEGATIVE NEGATIVE Final    Comment:        The GeneXpert MRSA Assay (FDA approved for NASAL specimens only), is one component of a comprehensive MRSA colonization surveillance program. It is not intended to diagnose MRSA infection nor to guide or monitor treatment for MRSA infections.   Surgical pcr screen     Status: None   Collection Time: 01/04/15 10:54 AM  Result Value Ref Range Status   MRSA, PCR NEGATIVE NEGATIVE Final   Staphylococcus aureus NEGATIVE NEGATIVE Final    Comment:        The Xpert SA Assay (FDA approved for NASAL specimens in patients over 79 years of age), is one component of a comprehensive surveillance program.  Test performance has been validated by [facility] for patients greater than or equal to 74 year old. It is not intended to diagnose infection nor to guide or monitor treatment.     Anti-infectives    Start     Dose/Rate Route Frequency Ordered Stop   01/07/15 1200  vancomycin (VANCOCIN) IVPB 750 mg/150 ml premix     750 mg 150 mL/hr over 60  Minutes Intravenous Every T-Th-Sa (Hemodialysis) 01/04/15 1450     01/04/15 1600  vancomycin (VANCOCIN) IVPB 750 mg/150 ml premix     750 mg 150 mL/hr over 60 Minutes Intravenous To Hemodialysis 01/04/15 1450 01/05/15 1600   01/04/15 1447  vancomycin (VANCOCIN) IVPB 750 mg/150 ml premix  Status:  Discontinued     750 mg 150 mL/hr over 60 Minutes Intravenous Every Dialysis 01/04/15 1448 01/04/15 1450   01/01/15 1045  vancomycin (VANCOCIN) IVPB 750 mg/150 ml premix     750 mg 150 mL/hr over 60 Minutes Intravenous  Once 01/01/15 1042 01/01/15 1428   12/31/14 1345  vancomycin (VANCOCIN) IVPB 750 mg/150 ml premix  Status:  Discontinued     750 mg 150 mL/hr over 60 Minutes Intravenous Every T-Th-Sa (Hemodialysis) 12/31/14 1343 01/04/15  1450   12/31/14 0600  piperacillin-tazobactam (ZOSYN) IVPB 2.25 g     2.25 g 100 mL/hr over 30 Minutes Intravenous Every 8 hours 12/31/14 0240     12/31/14 0330  vancomycin (VANCOCIN) 500 mg in sodium chloride 0.9 % 100 mL IVPB     500 mg 100 mL/hr over 60 Minutes Intravenous  Once 12/31/14 0249 12/31/14 0524   12/31/14 0245  piperacillin-tazobactam (ZOSYN) IVPB 3.375 g  Status:  Discontinued     3.375 g 100 mL/hr over 30 Minutes Intravenous  Once 12/31/14 0235 12/31/14 0257   12/31/14 0245  vancomycin (VANCOCIN) IVPB 1000 mg/200 mL premix  Status:  Discontinued     1,000 mg 200 mL/hr over 60 Minutes Intravenous  Once 12/31/14 0235 12/31/14 0249   12/30/14 2030  vancomycin (VANCOCIN) IVPB 1000 mg/200 mL premix  Status:  Discontinued     1,000 mg 200 mL/hr over 60 Minutes Intravenous  Once 12/30/14 2020 12/30/14 2238   12/30/14 2000  piperacillin-tazobactam (ZOSYN) IVPB 2.25 g  Status:  Discontinued     2.25 g 100 mL/hr over 30 Minutes Intravenous Every 8 hours 12/30/14 1920 12/30/14 2153   12/30/14 1915  vancomycin (VANCOCIN) IVPB 1000 mg/200 mL premix     1,000 mg 200 mL/hr over 60 Minutes Intravenous  Once 12/30/14 1911 12/30/14 2041      Assessment: 69 YOM with hx of ESRD- usually on PD (peritoneal dialysis but has received hemodialysis x 3 this week) presented to the ED with right foot pain and redness, failed outpatient treatment with PO keflex. Pharmacy is consulted to start vancomycin and zosyn. Abx D#6, WBC wnl, afebrile. Plan for R-BKA on 4/14. Having HD this PM off schedule.   Goal of Therapy:  Pre-HD Vanc Level 15-30mcg/mL  Plan:  Give Vanc 750mg  IV x 1 today post HD due to off-schedule HD with surgery in AM, then resume Vanc 750mg  IV qTThS with HD F/up HD plan Continue Zosyn 2.25g IV q8h F/u vascular surgery plans F/u LOT and de-escalation  Thank you for allowing pharmacy to be part of this patient's care team  Sloan Leiter, PharmD, BCPS Clinical  Pharmacist [phone]  01/04/2015 .2:52 PM

## 2015-01-05 ENCOUNTER — Encounter (HOSPITAL_COMMUNITY)
Admission: EM | Disposition: A | Discharge status: Home Health | Payer: Self-pay | Source: Home / Self Care | Attending: Internal Medicine

## 2015-01-05 ENCOUNTER — Inpatient Hospital Stay (HOSPITAL_COMMUNITY): Payer: Medicare Other | Admitting: Anesthesiology

## 2015-01-05 DIAGNOSIS — E785 Hyperlipidemia, unspecified: Secondary | ICD-10-CM

## 2015-01-05 HISTORY — PX: AMPUTATION: SHX166

## 2015-01-05 LAB — CBC
HCT: 26.6 % — ABNORMAL LOW (ref 39.0–52.0)
Hemoglobin: 8.1 g/dL — ABNORMAL LOW (ref 13.0–17.0)
MCH: 30.1 pg (ref 26.0–34.0)
MCHC: 30.5 g/dL (ref 30.0–36.0)
MCV: 98.9 fL (ref 78.0–100.0)
PLATELETS: 285 10*3/uL (ref 150–400)
RBC: 2.69 MIL/uL — AB (ref 4.22–5.81)
RDW: 18.6 % — ABNORMAL HIGH (ref 11.5–15.5)
WBC: 7.4 10*3/uL (ref 4.0–10.5)

## 2015-01-05 LAB — BASIC METABOLIC PANEL
Anion gap: 9 (ref 5–15)
BUN: 15 mg/dL (ref 6–23)
CO2: 29 mmol/L (ref 19–32)
Calcium: 8.5 mg/dL (ref 8.4–10.5)
Chloride: 100 mmol/L (ref 96–112)
Creatinine, Ser: 4.13 mg/dL — ABNORMAL HIGH (ref 0.50–1.35)
GFR calc Af Amer: 14 mL/min — ABNORMAL LOW (ref >90)
GFR, EST NON AFRICAN AMERICAN: 12 mL/min — AB (ref >90)
GLUCOSE: 83 mg/dL (ref 70–99)
Potassium: 4.1 mmol/L (ref 3.5–5.1)
Sodium: 138 mmol/L (ref 135–145)

## 2015-01-05 LAB — PROTIME-INR
INR: 1.03 (ref 0.00–1.49)
PROTHROMBIN TIME: 13.6 s (ref 11.6–15.2)

## 2015-01-05 SURGERY — AMPUTATION BELOW KNEE
Anesthesia: Regional | Site: Leg Lower | Laterality: Right

## 2015-01-05 MED ORDER — EPHEDRINE SULFATE 50 MG/ML IJ SOLN
INTRAMUSCULAR | Status: AC
  Filled 2015-01-05: qty 1

## 2015-01-05 MED ORDER — PHENOL 1.4 % MT LIQD: 1.0000 | OROMUCOSAL | Status: DC | PRN

## 2015-01-05 MED ORDER — BACITRACIN ZINC 500 UNIT/GM EX OINT
TOPICAL_OINTMENT | CUTANEOUS | Status: AC
  Filled 2015-01-05: qty 28.35

## 2015-01-05 MED ORDER — SODIUM CHLORIDE 0.9 % IV SOLN
INTRAVENOUS | Status: DC
  Administered 2015-01-05: 10 mL/h via INTRAVENOUS
  Administered 2015-01-05 – 2015-01-06 (×3): via INTRAVENOUS

## 2015-01-05 MED ORDER — MEPIVACAINE HCL 1.5 % IJ SOLN
INTRAMUSCULAR | Status: DC | PRN
  Administered 2015-01-05: 20 mL via PERINEURAL

## 2015-01-05 MED ORDER — FENTANYL CITRATE 0.05 MG/ML IJ SOLN
INTRAMUSCULAR | Status: DC | PRN
  Administered 2015-01-05: 50 ug via INTRAVENOUS

## 2015-01-05 MED ORDER — MAGNESIUM SULFATE 2 GM/50ML IV SOLN
2.0000 g | Freq: Every day | INTRAVENOUS | Status: DC | PRN
  Filled 2015-01-05: qty 50

## 2015-01-05 MED ORDER — LABETALOL HCL 5 MG/ML IV SOLN
10.0000 mg | INTRAVENOUS | Status: DC | PRN
  Filled 2015-01-05: qty 4

## 2015-01-05 MED ORDER — PROPOFOL 10 MG/ML IV BOLUS
INTRAVENOUS | Status: DC | PRN
  Administered 2015-01-05: 140 mg via INTRAVENOUS

## 2015-01-05 MED ORDER — DOCUSATE SODIUM 100 MG PO CAPS
100.0000 mg | ORAL_CAPSULE | Freq: Every day | ORAL | Status: DC
  Administered 2015-01-06 – 2015-01-13 (×7): 100 mg via ORAL
  Filled 2015-01-05 (×8): qty 1

## 2015-01-05 MED ORDER — ALUM & MAG HYDROXIDE-SIMETH 200-200-20 MG/5ML PO SUSP: 15.0000 mL | ORAL | Status: DC | PRN

## 2015-01-05 MED ORDER — 0.9 % SODIUM CHLORIDE (POUR BTL) OPTIME
TOPICAL | Status: DC | PRN
  Administered 2015-01-05: 1000 mL

## 2015-01-05 MED ORDER — FENTANYL CITRATE 0.05 MG/ML IJ SOLN
INTRAMUSCULAR | Status: AC
  Filled 2015-01-05: qty 5

## 2015-01-05 MED ORDER — GUAIFENESIN-DM 100-10 MG/5ML PO SYRP
15.0000 mL | ORAL_SOLUTION | ORAL | Status: DC | PRN
  Administered 2015-01-09: 15 mL via ORAL
  Filled 2015-01-05 (×2): qty 15

## 2015-01-05 MED ORDER — POTASSIUM CHLORIDE CRYS ER 20 MEQ PO TBCR: 20.0000 meq | EXTENDED_RELEASE_TABLET | Freq: Every day | ORAL | Status: DC | PRN

## 2015-01-05 MED ORDER — BACITRACIN ZINC 500 UNIT/GM EX OINT
TOPICAL_OINTMENT | CUTANEOUS | Status: DC | PRN
  Administered 2015-01-05: 1 via TOPICAL

## 2015-01-05 MED ORDER — METOPROLOL TARTRATE 1 MG/ML IV SOLN
2.0000 mg | INTRAVENOUS | Status: DC | PRN
  Filled 2015-01-05: qty 5

## 2015-01-05 MED ORDER — LIDOCAINE HCL (CARDIAC) 20 MG/ML IV SOLN
INTRAVENOUS | Status: AC
  Filled 2015-01-05: qty 5

## 2015-01-05 MED ORDER — PHENYLEPHRINE HCL 10 MG/ML IJ SOLN
10.0000 mg | INTRAMUSCULAR | Status: DC | PRN
  Administered 2015-01-05: 30 ug/min via INTRAVENOUS

## 2015-01-05 MED ORDER — SODIUM CHLORIDE 0.9 % IJ SOLN
INTRAMUSCULAR | Status: AC
  Filled 2015-01-05: qty 20

## 2015-01-05 MED ORDER — EPHEDRINE SULFATE 50 MG/ML IJ SOLN
INTRAMUSCULAR | Status: DC | PRN
  Administered 2015-01-05 (×4): 10 mg via INTRAVENOUS

## 2015-01-05 MED ORDER — SODIUM CHLORIDE 0.9 % IV SOLN: INTRAVENOUS | Status: DC

## 2015-01-05 MED ORDER — SODIUM CHLORIDE 0.9 % IJ SOLN
INTRAMUSCULAR | Status: AC
  Filled 2015-01-05: qty 10

## 2015-01-05 MED ORDER — HYDRALAZINE HCL 20 MG/ML IJ SOLN: 5.0000 mg | INTRAMUSCULAR | Status: DC | PRN

## 2015-01-05 MED ORDER — BUPIVACAINE HCL (PF) 0.5 % IJ SOLN
INTRAMUSCULAR | Status: DC | PRN
  Administered 2015-01-05: 30 mL via PERINEURAL

## 2015-01-05 MED ORDER — ONDANSETRON HCL 4 MG/2ML IJ SOLN
INTRAMUSCULAR | Status: DC | PRN
  Administered 2015-01-05: 4 mg via INTRAVENOUS

## 2015-01-05 MED ORDER — MIDAZOLAM HCL 2 MG/2ML IJ SOLN
INTRAMUSCULAR | Status: AC
  Filled 2015-01-05: qty 2

## 2015-01-05 SURGICAL SUPPLY — 57 items
BANDAGE ELASTIC 6 VELCRO ST LF (GAUZE/BANDAGES/DRESSINGS) ×3 IMPLANT
BANDAGE ESMARK 6X9 LF (GAUZE/BANDAGES/DRESSINGS) ×1 IMPLANT
BLADE SAW RECIP 87.9 MT (BLADE) ×3 IMPLANT
BNDG COHESIVE 6X5 TAN STRL LF (GAUZE/BANDAGES/DRESSINGS) ×3 IMPLANT
BNDG ESMARK 6X9 LF (GAUZE/BANDAGES/DRESSINGS) ×3
BNDG GAUZE ELAST 4 BULKY (GAUZE/BANDAGES/DRESSINGS) ×3 IMPLANT
CANISTER SUCTION 2500CC (MISCELLANEOUS) ×3 IMPLANT
CLIP TI MEDIUM 6 (CLIP) ×3 IMPLANT
COVER SURGICAL LIGHT HANDLE (MISCELLANEOUS) ×3 IMPLANT
CUFF TOURNIQUET SINGLE 24IN (TOURNIQUET CUFF) IMPLANT
CUFF TOURNIQUET SINGLE 34IN LL (TOURNIQUET CUFF) IMPLANT
CUFF TOURNIQUET SINGLE 44IN (TOURNIQUET CUFF) IMPLANT
DRAIN CHANNEL 19F RND (DRAIN) IMPLANT
DRAPE ORTHO SPLIT 77X108 STRL (DRAPES) ×4
DRAPE PROXIMA HALF (DRAPES) ×3 IMPLANT
DRAPE SURG ORHT 6 SPLT 77X108 (DRAPES) ×2 IMPLANT
DRAPE U-SHAPE 47X51 STRL (DRAPES) ×3 IMPLANT
DRSG ADAPTIC 3X8 NADH LF (GAUZE/BANDAGES/DRESSINGS) ×3 IMPLANT
ELECT REM PT RETURN 9FT ADLT (ELECTROSURGICAL) ×3
ELECTRODE REM PT RTRN 9FT ADLT (ELECTROSURGICAL) ×1 IMPLANT
EVACUATOR SILICONE 100CC (DRAIN) IMPLANT
GAUZE SPONGE 4X4 12PLY STRL (GAUZE/BANDAGES/DRESSINGS) ×6 IMPLANT
GLOVE BIO SURGEON STRL SZ 6.5 (GLOVE) ×2 IMPLANT
GLOVE BIO SURGEON STRL SZ7.5 (GLOVE) ×3 IMPLANT
GLOVE BIO SURGEONS STRL SZ 6.5 (GLOVE) ×1
GLOVE BIOGEL PI IND STRL 6.5 (GLOVE) ×2 IMPLANT
GLOVE BIOGEL PI IND STRL 7.0 (GLOVE) ×1 IMPLANT
GLOVE BIOGEL PI IND STRL 7.5 (GLOVE) ×1 IMPLANT
GLOVE BIOGEL PI IND STRL 8 (GLOVE) ×1 IMPLANT
GLOVE BIOGEL PI INDICATOR 6.5 (GLOVE) ×4
GLOVE BIOGEL PI INDICATOR 7.0 (GLOVE) ×2
GLOVE BIOGEL PI INDICATOR 7.5 (GLOVE) ×2
GLOVE BIOGEL PI INDICATOR 8 (GLOVE) ×2
GLOVE ECLIPSE 6.5 STRL STRAW (GLOVE) ×3 IMPLANT
GLOVE ECLIPSE 7.0 STRL STRAW (GLOVE) ×3 IMPLANT
GLOVE SURG SS PI 6.5 STRL IVOR (GLOVE) ×6 IMPLANT
GOWN STRL REUS W/ TWL LRG LVL3 (GOWN DISPOSABLE) ×3 IMPLANT
GOWN STRL REUS W/TWL LRG LVL3 (GOWN DISPOSABLE) ×6
KIT BASIN OR (CUSTOM PROCEDURE TRAY) ×3 IMPLANT
KIT ROOM TURNOVER OR (KITS) ×3 IMPLANT
NS IRRIG 1000ML POUR BTL (IV SOLUTION) ×3 IMPLANT
PACK GENERAL/GYN (CUSTOM PROCEDURE TRAY) ×3 IMPLANT
PAD ARMBOARD 7.5X6 YLW CONV (MISCELLANEOUS) ×6 IMPLANT
PADDING CAST COTTON 6X4 STRL (CAST SUPPLIES) ×3 IMPLANT
SPONGE GAUZE 4X4 12PLY STER LF (GAUZE/BANDAGES/DRESSINGS) ×3 IMPLANT
STAPLER VISISTAT (STAPLE) ×3 IMPLANT
STOCKINETTE IMPERVIOUS LG (DRAPES) ×3 IMPLANT
SUT ETHILON 3 0 PS 1 (SUTURE) IMPLANT
SUT SILK 0 TIES 10X30 (SUTURE) ×3 IMPLANT
SUT SILK 2 0 (SUTURE) ×2
SUT SILK 2 0 SH CR/8 (SUTURE) ×3 IMPLANT
SUT SILK 2-0 18XBRD TIE 12 (SUTURE) ×1 IMPLANT
SUT SILK 3 0 (SUTURE) ×2
SUT SILK 3-0 18XBRD TIE 12 (SUTURE) ×1 IMPLANT
SUT VIC AB 2-0 CT1 18 (SUTURE) ×6 IMPLANT
UNDERPAD 30X30 INCONTINENT (UNDERPADS AND DIAPERS) ×3 IMPLANT
WATER STERILE IRR 1000ML POUR (IV SOLUTION) ×3 IMPLANT

## 2015-01-05 NOTE — Op Note (Signed)
    NAME: Randy [name]   MRN: [id number] DOB: [birth year]    DATE OF OPERATION: 01/05/2015  PREOP DIAGNOSIS: gangrene of the right foot with peripheral vascular disease  POSTOP DIAGNOSIS: same  PROCEDURE: right below the knee amputation  SURGEON: Judeth Cornfield. Scot Dock, MD, FACS  ASSIST: Gerri Lins PA  ANESTHESIA: Gen.   EBL: minimal  INDICATIONS: [name] is a 81 y.o. male who presented with a nonhealing wound of the right foot and multilevel arterial occlusive disease. He was not a candidate for revascularization.  FINDINGS: The muscle appeared well-perfused.  TECHNIQUE: The patient was taken to the operating room and received a general anesthetic. The right lower extremity was prepped and draped in usual sterile fashion. The circumference of the limb was measured 10 cm distal to the tibial tuberosity and two thirds of this distance was used to mark the anterior skin flap. A long posterior flap of equal length was marked. The leg was exsanguinated with an eschar bandage and tourniquet inflated to 300 mmHg. Under tourniquet control incision was carried down to the skin, subcutaneous tissue, fascia, and muscle to the tibia and fibula which were dissected free circumferentially. The periosteum was elevated proximal to the level of skin division. The bone was then divided. The anterior aspect of the tibia was beveled. The fibula was divided. The arteries and veins were individually suture ligated with 2-0 silk sutures. The tourniquet was released. Additional hemostasis was obtained using electrocautery and 2-0 silk ties. The edges of the bone were rasped. The wound was irrigated with saline. The fascial layer was closed with interrupted 2-0 Vicryl's. The skin was closed with staples. Sterile dressing was applied. The patient tolerated the procedure well and was transferred to the recovery room in stable condition. All needle and sponge counts were correct.   [name], MD,  FACS [facility]  DATE OF DICTATION:   01/05/2015

## 2015-01-05 NOTE — Care Management Note (Signed)
CARE MANAGEMENT NOTE 01/05/2015  Patient:  Randy [name], Randy [name]   Account Number:  [id number]  Date Initiated:  01/03/2015  Documentation initiated by:  Gustava Berland  Subjective/Objective Assessment:   CM following for progression and d/c planning.     Action/Plan:   Plan to d/c to home with [facility] services and will get second opinion as outpatient.  01/05/2015 Pt to OR today.   Anticipated DC Date:     Anticipated DC Plan:  [place]         Choice offered to / List presented to:          [facility] arranged  HH-2 PT  HH-1 RN      [place]   Status of service:  Completed, signed off Medicare Important Message given?  YES (If response is "NO", the following Medicare IM given date fields will be blank) Date Medicare IM given:  01/03/2015 Medicare IM given by:  Jasmine Pang Date Additional Medicare IM given:  01/05/2015 Additional Medicare IM given by:  [facility]  Discharge Disposition:  [place]  Per UR Regulation:    If discussed at Long Length of Stay Meetings, dates discussed:    Comments:

## 2015-01-05 NOTE — Transfer of Care (Signed)
Immediate Anesthesia Transfer of Care Note  Patient: Randy [name]  Procedure(s) Performed: Procedure(s): AMPUTATION BELOW KNEE (Right)  Patient Location: PACU  Anesthesia Type:General  Level of Consciousness: awake, alert , oriented and patient cooperative  Airway & Oxygen Therapy: Patient Spontanous Breathing and Patient connected to face mask oxygen  Post-op Assessment: Report given to RN, Post -op Vital signs reviewed and stable and Patient moving all extremities  Post vital signs: Reviewed and stable  Last Vitals:  Filed Vitals:   01/05/15 1335  BP:   Pulse: 70  Temp: 36.4 C  Resp: 12    Complications: No apparent anesthesia complications

## 2015-01-05 NOTE — Interval H&P Note (Signed)
History and Physical Interval Note:  01/05/2015 12:02 PM  Randy [name]  has presented today for surgery, with the diagnosis of Peripheral vascular disease with nonhealing ulcer to right foot, Right foot pain.  The various methods of treatment have been discussed with the patient and family. After consideration of risks, benefits and other options for treatment, the patient has consented to  Procedure(s): AMPUTATION BELOW KNEE (Right) as a surgical intervention .  [name] history has been reviewed, patient examined, no change in status, stable for surgery.  I have reviewed the patient's chart and labs.  Questions were answered to the patient's satisfaction.     Loren Sawaya S

## 2015-01-05 NOTE — Progress Notes (Signed)
PROGRESS NOTE  Randy [name] VHQ:[id number] DOB: [birth year] DOA: 12/30/2014 PCP: [name], MD  Assessment/Plan: Right foot pain:  -treated outpatient with Keflex due to suspected cellulitis,without improvement.  - had arterial Seg signal doppler study on 12/27/14, which showed evidence of significant peripheral small vessel tibial disease. -IV abx, continue with Vancomycin.  -vascular consult: Plan for Right Above Knee amputation 4-14. -Patient denies chest pain or dyspnea.   Kidney mass:  incidental finding on his CT scan was a 2 cm enhancing mass in the midpole of the left kidney concerning for possible neoplasm or malignancy. MRI: Follow up CT without and with contrast in 1 year suggested to assess the stability of this lesion.  ESRD-HD with hyperkalemia at admission: renal for HD  Hypertension: -continue home medications: Amlodipine, clonidine, hydralazine, Coreg -hold lisinopril prior surgery.   Hyperlipidemia: -Lipitor  GERD: -Protonix  CAD: Currently no chest pain. EKG has no new change. -Continue aspirin and Lipitor  Code Status: full Family Communication: patient/  Disposition Plan: surgery 4-14   Consultants:  vascular  Procedures:      HPI/Subjective: Still with right foot pain. He just received IV pain medications. Denies chest pain or dyspnea.    Objective: Filed Vitals:   01/05/15 1040  BP: 135/84  Pulse: 75  Temp: 98.2 F (36.8 C)  Resp: 16    Intake/Output Summary (Last 24 hours) at 01/05/15 1252 Last data filed at 01/05/15 1215  Gross per 24 hour  Intake   1560 ml  Output   1500 ml  Net     60 ml   Filed Weights   01/03/15 1508 01/03/15 2140 01/04/15 2218  Weight: 74.6 kg (164 lb 7.4 oz) 75.841 kg (167 lb 3.2 oz) 74.9 kg (165 lb 2 oz)    Exam:   General:  NAD  Cardiovascular: rrr  Respiratory: clear  Abdomen: +BS, soft  Musculoskeletal: no palpable pulse, discoloration finger.   Data Reviewed: Basic Metabolic  Panel:  Recent Labs Lab 12/31/14 0432 12/31/14 1210 01/02/15 0447 01/03/15 1115 01/04/15 1900 01/05/15 0610  NA 138  --  132* 129* 133* 138  K 4.5  --  3.7 3.6 3.0* 4.1  CL 98  --  95* 89* 94* 100  CO2 22  --  23 24 27 29   GLUCOSE 143*  --  118* 147* 135* 83  BUN 44*  --  39* 61* 23 15  CREATININE 8.03*  --  6.94* 9.29* 4.73* 4.13*  CALCIUM 8.6  --  8.6 8.7 8.2* 8.5  PHOS  --  6.7*  --  5.8*  --   --    Liver Function Tests:  Recent Labs Lab 12/30/14 1754 12/31/14 0432 01/03/15 1115  AST 48* 29  --   ALT 47 41  --   ALKPHOS 83 65  --   BILITOT 0.4 0.5  --   PROT 5.8* 5.9*  --   ALBUMIN 2.9* 2.7* 2.1*   No results for input(s): LIPASE, AMYLASE in the last 168 hours. No results for input(s): AMMONIA in the last 168 hours. CBC:  Recent Labs Lab 12/30/14 1754 12/31/14 0432 01/02/15 0447 01/03/15 1114 01/04/15 1900 01/05/15 0610  WBC 14.4* 12.2* 9.8 7.8 7.9 7.4  NEUTROABS 12.8*  --   --   --   --   --   HGB 8.3* 8.3* 8.5* 7.8* 8.2* 8.1*  HCT 27.1* 27.9* 28.3* 25.3* 26.4* 26.6*  MCV 97.8 98.2 98.3 95.1 96.4 98.9  PLT 290 295 306  291 303 285   Cardiac Enzymes: No results for input(s): CKTOTAL, CKMB, CKMBINDEX, TROPONINI in the last 168 hours. BNP (last 3 results) No results for input(s): BNP in the last 8760 hours.  ProBNP (last 3 results)  Recent Labs  06/21/14 1046 08/17/14 0625 08/29/14 0928  PROBNP >70000.0* >70000.0* >70000.0*    CBG: No results for input(s): GLUCAP in the last 168 hours.  Recent Results (from the past 240 hour(s))  Blood culture (routine x 2)     Status: None (Preliminary result)   Collection Time: 12/30/14  5:54 PM  Result Value Ref Range Status   Specimen Description BLOOD RIGHT FOREARM  Final   Special Requests BOTTLES DRAWN AEROBIC AND ANAEROBIC 10CC  Final   Culture   Final           BLOOD CULTURE RECEIVED NO GROWTH TO DATE CULTURE WILL BE HELD FOR 5 DAYS BEFORE ISSUING A FINAL NEGATIVE REPORT Performed at [facility]    Report Status PENDING  Incomplete  Blood culture (routine x 2)     Status: None (Preliminary result)   Collection Time: 12/30/14  6:06 PM  Result Value Ref Range Status   Specimen Description BLOOD HAND RIGHT  Final   Special Requests BOTTLES DRAWN AEROBIC AND ANAEROBIC 5CC  Final   Culture   Final           BLOOD CULTURE RECEIVED NO GROWTH TO DATE CULTURE WILL BE HELD FOR 5 DAYS BEFORE ISSUING A FINAL NEGATIVE REPORT Performed at [facility]    Report Status PENDING  Incomplete  MRSA PCR Screening     Status: None   Collection Time: 12/31/14  1:11 AM  Result Value Ref Range Status   MRSA by PCR NEGATIVE NEGATIVE Final    Comment:        The GeneXpert MRSA Assay (FDA approved for NASAL specimens only), is one component of a comprehensive MRSA colonization surveillance program. It is not intended to diagnose MRSA infection nor to guide or monitor treatment for MRSA infections.   Surgical pcr screen     Status: None   Collection Time: 01/04/15 10:54 AM  Result Value Ref Range Status   MRSA, PCR NEGATIVE NEGATIVE Final   Staphylococcus aureus NEGATIVE NEGATIVE Final    Comment:        The Xpert SA Assay (FDA approved for NASAL specimens in patients over 53 years of age), is one component of a comprehensive surveillance program.  Test performance has been validated by [facility] for patients greater than or equal to 33 year old. It is not intended to diagnose infection nor to guide or monitor treatment.      Studies: No results found.  Scheduled Meds: . [MAR Hold] amLODipine  10 mg Oral Daily  . [MAR Hold] aspirin  325 mg Oral Daily  . [MAR Hold] atorvastatin  40 mg Oral q1800  . [MAR Hold] calcitRIOL  0.5 mcg Oral Daily  . [MAR Hold] carvedilol  25 mg Oral BID WC  . [MAR Hold] cloNIDine  0.2 mg Oral BID  . [MAR Hold] darbepoetin (ARANESP) injection - DIALYSIS  100 mcg Intravenous Q Sat-HD  . [MAR Hold] feeding supplement (RESOURCE BREEZE)   1 Container Oral TID BM  . [MAR Hold] heparin  5,000 Units Subcutaneous 3 times per day  . [MAR Hold] multivitamin  1 tablet Oral Daily  . [MAR Hold] pantoprazole  40 mg Oral Daily  . [MAR Hold] piperacillin-tazobactam (ZOSYN)  IV  2.25 g Intravenous Q8H  . [MAR Hold] rOPINIRole  0.25-0.5 mg Oral QHS  . [MAR Hold] sevelamer carbonate  4,000 mg Oral TID WC  . [MAR Hold] sodium chloride  3 mL Intravenous Q12H  . [facility] Hold] vancomycin  750 mg Intravenous Q T,Th,Sa-HD   Continuous Infusions: . sodium chloride 10 mL/hr (01/05/15 1103)   Antibiotics Given (last 72 hours)    Date/Time Action Medication Dose Rate   01/02/15 1420 Given   [MAR Hold] piperacillin-tazobactam (ZOSYN) IVPB 2.25 g (MAR Hold since 01/05/15 1046) 2.25 g 100 mL/hr   01/02/15 2125 Given   [MAR Hold] piperacillin-tazobactam (ZOSYN) IVPB 2.25 g (MAR Hold since 01/05/15 1046) 2.25 g 100 mL/hr   01/03/15 0624 Given   [MAR Hold] piperacillin-tazobactam (ZOSYN) IVPB 2.25 g (MAR Hold since 01/05/15 1046) 2.25 g 100 mL/hr   01/03/15 1200 Given  [Given in Dialysis]   vancomycin (VANCOCIN) IVPB 750 mg/150 ml premix 750 mg 150 mL/hr   01/03/15 1630 Given   [MAR Hold] piperacillin-tazobactam (ZOSYN) IVPB 2.25 g (MAR Hold since 01/05/15 1046) 2.25 g 100 mL/hr   01/03/15 2217 Given   [MAR Hold] piperacillin-tazobactam (ZOSYN) IVPB 2.25 g (MAR Hold since 01/05/15 1046) 2.25 g 100 mL/hr   01/04/15 0549 Given   [MAR Hold] piperacillin-tazobactam (ZOSYN) IVPB 2.25 g (MAR Hold since 01/05/15 1046) 2.25 g 100 mL/hr   01/04/15 1350 Given   [MAR Hold] piperacillin-tazobactam (ZOSYN) IVPB 2.25 g (MAR Hold since 01/05/15 1046) 2.25 g 100 mL/hr   01/04/15 1614 Given   vancomycin (VANCOCIN) IVPB 750 mg/150 ml premix 750 mg 150 mL/hr   01/04/15 2304 Given   [MAR Hold] piperacillin-tazobactam (ZOSYN) IVPB 2.25 g (MAR Hold since 01/05/15 1046) 2.25 g 100 mL/hr   01/05/15 0626 Given   [MAR Hold] piperacillin-tazobactam (ZOSYN) IVPB 2.25 g (MAR  Hold since 01/05/15 1046) 2.25 g 100 mL/hr      Principal Problem:   Foot pain, right Active Problems:   HLD (hyperlipidemia)   Polycystic kidney   Lower GI bleed   ESRD (end stage renal disease) on dialysis   Foot pain   CHF (congestive heart failure)   CAD (coronary artery disease)   Hyperkalemia   Renal mass    Time spent: 35 min    Kieana Livesay A  [facility] Pager [phone] If 7PM-7AM, please contact night-coverage at [email or web address], password [facility] 01/05/2015, 12:52 PM  LOS: 6 days

## 2015-01-05 NOTE — Anesthesia Postprocedure Evaluation (Signed)
Anesthesia Post Note  Patient: Randy [name]  Procedure(s) Performed: Procedure(s) (LRB): AMPUTATION BELOW KNEE (Right)  Anesthesia type: General + adductor/popliteal nerve blocks  Patient location: PACU  Post pain: Pain level controlled  Post assessment: Post-op Vital signs reviewed  Last Vitals: BP 134/74 mmHg  Pulse 79  Temp(Src) 36.9 C (Oral)  Resp 15  Ht 5\' 9"  (1.753 m)  Wt 165 lb 2 oz (74.9 kg)  BMI 24.37 kg/m2  SpO2 100%  Post vital signs: Reviewed  Level of consciousness: sedated  Complications: No apparent anesthesia complications

## 2015-01-05 NOTE — Progress Notes (Signed)
Patient ID: Randy [name], male   DOB: 1931-11-18, 81 y.o.   MRN: [id number]  [facility] Progress Note    Subjective:   Doing well post-op   Objective:   BP 134/74 mmHg  Pulse 79  Temp(Src) 98.4 F (36.9 C) (Oral)  Resp 15  Ht 5\' 9"  (1.753 m)  Wt 74.9 kg (165 lb 2 oz)  BMI 24.37 kg/m2  SpO2 100%  Intake/Output: I/O last 3 completed shifts: In: 1522 [P.O.:1422; IV Piggyback:100] Out: 1500 [Other:1500]   Intake/Output this shift:  Total I/O In: 1000 [I.V.:1000] Out: -  Weight change: -1.9 kg (-4 lb 3 oz)  Physical Exam: Gen:WD elderly AAM in NAd CVS:no rub Resp:cta GBT:DVVOHY Ext:s/p R BKA, left cimino AVF +T/B  Labs: BMET  Recent Labs Lab 12/30/14 1754 12/31/14 0432 12/31/14 1210 01/02/15 0447 01/03/15 1115 01/04/15 1900 01/05/15 0610  NA 138 138  --  132* 129* 133* 138  K 5.4* 4.5  --  3.7 3.6 3.0* 4.1  CL 99 98  --  95* 89* 94* 100  CO2 27 22  --  23 24 27 29   GLUCOSE 117* 143*  --  118* 147* 135* 83  BUN 42* 44*  --  39* 61* 23 15  CREATININE 7.80* 8.03*  --  6.94* 9.29* 4.73* 4.13*  ALBUMIN 2.9* 2.7*  --   --  2.1*  --   --   CALCIUM 8.8 8.6  --  8.6 8.7 8.2* 8.5  PHOS  --   --  6.7*  --  5.8*  --   --    CBC  Recent Labs Lab 12/30/14 1754  01/02/15 0447 01/03/15 1114 01/04/15 1900 01/05/15 0610  WBC 14.4*  < > 9.8 7.8 7.9 7.4  NEUTROABS 12.8*  --   --   --   --   --   HGB 8.3*  < > 8.5* 7.8* 8.2* 8.1*  HCT 27.1*  < > 28.3* 25.3* 26.4* 26.6*  MCV 97.8  < > 98.3 95.1 96.4 98.9  PLT 290  < > 306 291 303 285  < > = values in this interval not displayed.  @IMGRELPRIORS @ Medications:    . amLODipine  10 mg Oral Daily  . aspirin  325 mg Oral Daily  . atorvastatin  40 mg Oral q1800  . calcitRIOL  0.5 mcg Oral Daily  . carvedilol  25 mg Oral BID WC  . cloNIDine  0.2 mg Oral BID  . darbepoetin (ARANESP) injection - DIALYSIS  100 mcg Intravenous Q Sat-HD  . [START ON 01/06/2015] docusate sodium  100 mg Oral Daily  .  feeding supplement (RESOURCE BREEZE)  1 Container Oral TID BM  . heparin  5,000 Units Subcutaneous 3 times per day  . multivitamin  1 tablet Oral Daily  . pantoprazole  40 mg Oral Daily  . piperacillin-tazobactam (ZOSYN)  IV  2.25 g Intravenous Q8H  . rOPINIRole  0.25-0.5 mg Oral QHS  . sevelamer carbonate  4,000 mg Oral TID WC  . sodium chloride  3 mL Intravenous Q12H  . [START ON 01/07/2015] vancomycin  750 mg Intravenous Q T,Th,Sa-HD     Assessment/ Plan:   1. PVD- s/p right BKA 2. ESRD- normally TTS, off schedule due to surgery today, plan for HD again on Saturday 3. Anemia: cont with Aranesp and transfuse prn 4. 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 Nutrition:per primary 6. Hypertension:stable 7. Nodule in right kidney  Rossi Silvestro A 01/05/2015, 3:46 PM

## 2015-01-05 NOTE — H&P (View-Only) (Signed)
   VASCULAR SURGERY ASSESSMENT & PLAN:  * I again have recommended right BKA or AKA. He does not have adequate circulation to heel the right foot wounds. He is not a candidate for revascularization given his diffuse multilevel disease with peroneal runoff only. He seems more receptive today. I tried to call his daughter Randy [name] [phone]), and left a massage.  * I am in the office tomorrow. If he is agreeable to surgery, I could schedule for Thursday or Friday. If he is not agreeable, I will follow the foot as an outpt and proceed if/when he is agreeable.   SUBJECTIVE: Pain tolerable.  PHYSICAL EXAM: Filed Vitals:   01/02/15 1649 01/02/15 2130 01/03/15 0456 01/03/15 0746  BP: 111/48 118/55 124/60 110/55  Pulse: 70 71 99 67  Temp: 98 F (36.7 C) 98.1 F (36.7 C) 98.3 F (36.8 C) 97.8 F (36.6 C)  TempSrc: Oral Oral Oral Oral  Resp: 18 18 16 18   Height:      Weight:  168 lb 3.2 oz (76.295 kg)    SpO2: 99% 100% 99% 98%   2nd, 3rd, 4th toes right foot gradually worsening.   LABS: Lab Results  Component Value Date   WBC 9.8 01/02/2015   HGB 8.5* 01/02/2015   HCT 28.3* 01/02/2015   MCV 98.3 01/02/2015   PLT 306 01/02/2015   Lab Results  Component Value Date   CREATININE 6.94* 01/02/2015   Lab Results  Component Value Date   INR 1.12 12/30/2014   CBG (last 3)  No results for input(s): GLUCAP in the last 72 hours.  Principal Problem:   Foot pain, right Active Problems:   HLD (hyperlipidemia)   Polycystic kidney   Lower GI bleed   ESRD (end stage renal disease) on dialysis   Foot pain   CHF (congestive heart failure)   CAD (coronary artery disease)   Hyperkalemia   Renal mass    Randy [name] Beeper: [phone] 01/03/2015

## 2015-01-05 NOTE — Anesthesia Preprocedure Evaluation (Addendum)
Anesthesia Evaluation  Patient identified by MRN, date of birth, ID band Patient awake    Reviewed: Allergy & Precautions, NPO status , Patient's Chart, lab work & pertinent test results  History of Anesthesia Complications Negative for: history of anesthetic complications  Airway Mallampati: I       Dental  (+) Edentulous Upper, Edentulous Lower   Pulmonary former smoker,  Quit 1995   Pulmonary exam normal       Cardiovascular hypertension, + CAD, + Past MI, + Peripheral Vascular Disease and +CHF Rhythm:Irregular  Patient told he had mild MI.  Echo 12/15  EF=35-40%   Neuro/Psych negative neurological ROS  negative psych ROS   GI/Hepatic   Endo/Other  negative endocrine ROS  Renal/GU ESRF and DialysisRenal diseaseBoth hemodialysis and peritoneal dialysis.  Dialyed 01/04/15     Musculoskeletal  (+) Arthritis -,   Abdominal (+)  Abdomen: soft.    Peds  Hematology  (+) anemia ,   Anesthesia Other Findings   Reproductive/Obstetrics                         Anesthesia Physical Anesthesia Plan  ASA: III  Anesthesia Plan: General and Regional   Post-op Pain Management:    Induction: Intravenous  Airway Management Planned: LMA  Additional Equipment:   Intra-op Plan:   Post-operative Plan: Extubation in OR  Informed Consent: I have reviewed the patients History and Physical, chart, labs and discussed the procedure including the risks, benefits and alternatives for the proposed anesthesia with the patient or authorized representative who has indicated his/her understanding and acceptance.   Dental advisory given  Plan Discussed with: CRNA  Anesthesia Plan Comments:        Anesthesia Quick Evaluation                                  Anesthesia Evaluation  Patient identified by MRN, date of birth, ID band Patient awake    Reviewed: Allergy & Precautions, H&P , NPO status ,  Patient's Chart, lab work & pertinent test results, reviewed documented beta blocker date and time   Airway Mallampati: II TM Distance: >3 FB Neck ROM: full    Dental   Pulmonary shortness of breath and with exertion, pneumonia -, resolved,  breath sounds clear to auscultation        Cardiovascular hypertension, On Medications and On Home Beta Blockers + Peripheral Vascular Disease and +CHF Rhythm:regular     Neuro/Psych  Neuromuscular disease negative psych ROS   GI/Hepatic negative GI ROS, Neg liver ROS,   Endo/Other  negative endocrine ROS  Renal/GU ESRF and DialysisRenal disease  negative genitourinary   Musculoskeletal   Abdominal   Peds  Hematology  (+) anemia ,   Anesthesia Other Findings See surgeon's H&P   Reproductive/Obstetrics negative OB ROS                           Anesthesia Physical Anesthesia Plan  ASA: III  Anesthesia Plan: MAC   Post-op Pain Management:    Induction: Intravenous  Airway Management Planned: Simple Face Mask  Additional Equipment:   Intra-op Plan:   Post-operative Plan:   Informed Consent: I have reviewed the patients History and Physical, chart, labs and discussed the procedure including the risks, benefits and alternatives for the proposed anesthesia with the patient or authorized representative who has  indicated his/her understanding and acceptance.   Dental Advisory Given  Plan Discussed with: CRNA and Surgeon  Anesthesia Plan Comments:         Anesthesia Quick Evaluation

## 2015-01-05 NOTE — Anesthesia Procedure Notes (Addendum)
Procedure Name: LMA Insertion Date/Time: 01/05/2015 12:27 PM Performed by: Izora Gala Pre-anesthesia Checklist: Patient identified, Emergency Drugs available, Suction available and Patient being monitored Patient Re-evaluated:Patient Re-evaluated prior to inductionOxygen Delivery Method: Circle system utilized Preoxygenation: Pre-oxygenation with 100% oxygen Intubation Type: IV induction Ventilation: Mask ventilation without difficulty LMA: LMA inserted LMA Size: 5.0 Number of attempts: 1 Placement Confirmation: positive ETCO2 Tube secured with: Tape Dental Injury: Teeth and Oropharynx as per pre-operative assessment     Anesthesia Regional Block:  Adductor canal block  Pre-Anesthetic Checklist: ,, timeout performed, Correct Patient, Correct Site, Correct Laterality, Correct Procedure, Correct Position, site marked, Risks and benefits discussed, Surgical consent,  Pre-op evaluation,  Post-op pain management  Laterality: Right  Prep: chloraprep       Needles:  Injection technique: Single-shot  Needle Type: Stimiplex     Needle Length: 9cm 9 cm Needle Gauge: 21 and 21 G    Additional Needles:  Procedures: ultrasound guided (picture in chart) Adductor canal block Narrative:  Injection made incrementally with aspirations every 5 mL.  Performed by: Personally  Anesthesiologist: Nolon Nations  Additional Notes: BP cuff, EKG monitors applied. Sedation begun. Artery and nerve location verified with U/S and anesthetic injected incrementally, slowly, and after negative aspirations under direct u/s guidance. Good fascial /perineural spread. Tolerated well.   Anesthesia Regional Block:  Popliteal block  Pre-Anesthetic Checklist: ,, timeout performed, Correct Patient, Correct Site, Correct Laterality, Correct Procedure, Correct Position, site marked, Risks and benefits discussed, Surgical consent,  Pre-op evaluation,  Post-op pain management  Laterality: Right  Prep:  chloraprep       Needles:  Injection technique: Single-shot  Needle Type: Stimiplex     Needle Length: 9cm 9 cm Needle Gauge: 21 and 21 G    Additional Needles:  Procedures: ultrasound guided (picture in chart) and nerve stimulator  Motor weakness within 5 minutes. Popliteal block  Nerve Stimulator or Paresthesia:  Response: Foot eversion, 0.5 mA,   Additional Responses:   Narrative:  Injection made incrementally with aspirations every 5 mL.  Performed by: Personally  Anesthesiologist: Nolon Nations  Additional Notes: Nerve located and needle positioned with direct ultrasound guidance. Good perineural spread. Patient tolerated well.

## 2015-01-06 ENCOUNTER — Inpatient Hospital Stay (HOSPITAL_COMMUNITY): Payer: Medicare Other

## 2015-01-06 DIAGNOSIS — Z89511 Acquired absence of right leg below knee: Secondary | ICD-10-CM

## 2015-01-06 DIAGNOSIS — I5021 Acute systolic (congestive) heart failure: Secondary | ICD-10-CM

## 2015-01-06 LAB — BASIC METABOLIC PANEL
Anion gap: 14 (ref 5–15)
BUN: 26 mg/dL — AB (ref 6–23)
CALCIUM: 9.1 mg/dL (ref 8.4–10.5)
CHLORIDE: 97 mmol/L (ref 96–112)
CO2: 23 mmol/L (ref 19–32)
CREATININE: 6.04 mg/dL — AB (ref 0.50–1.35)
GFR calc Af Amer: 9 mL/min — ABNORMAL LOW (ref >90)
GFR calc non Af Amer: 8 mL/min — ABNORMAL LOW (ref >90)
GLUCOSE: 104 mg/dL — AB (ref 70–99)
Potassium: 4.9 mmol/L (ref 3.5–5.1)
Sodium: 134 mmol/L — ABNORMAL LOW (ref 135–145)

## 2015-01-06 LAB — CULTURE, BLOOD (ROUTINE X 2)
CULTURE: NO GROWTH
Culture: NO GROWTH

## 2015-01-06 LAB — CBC
HEMATOCRIT: 28.1 % — AB (ref 39.0–52.0)
HEMOGLOBIN: 8.5 g/dL — AB (ref 13.0–17.0)
MCH: 30.4 pg (ref 26.0–34.0)
MCHC: 30.2 g/dL (ref 30.0–36.0)
MCV: 100.4 fL — AB (ref 78.0–100.0)
Platelets: 331 10*3/uL (ref 150–400)
RBC: 2.8 MIL/uL — ABNORMAL LOW (ref 4.22–5.81)
RDW: 18.8 % — ABNORMAL HIGH (ref 11.5–15.5)
WBC: 10.3 10*3/uL (ref 4.0–10.5)

## 2015-01-06 LAB — VANCOMYCIN, RANDOM: VANCOMYCIN RM: 11.3 ug/mL

## 2015-01-06 LAB — BLOOD GAS, ARTERIAL
ACID-BASE EXCESS: 1.3 mmol/L (ref 0.0–2.0)
Bicarbonate: 26 mEq/L — ABNORMAL HIGH (ref 20.0–24.0)
DRAWN BY: 313941
Expiratory PAP: 6
FIO2: 1 %
Inspiratory PAP: 8
LHR: 8 {breaths}/min
O2 SAT: 99.7 %
PO2 ART: 410 mmHg — AB (ref 80.0–100.0)
Patient temperature: 98.6
TCO2: 27.5 mmol/L (ref 0–100)
pCO2 arterial: 46.1 mmHg — ABNORMAL HIGH (ref 35.0–45.0)
pH, Arterial: 7.371 (ref 7.350–7.450)

## 2015-01-06 MED ORDER — LIDOCAINE-PRILOCAINE 2.5-2.5 % EX CREA
1.0000 "application " | TOPICAL_CREAM | CUTANEOUS | Status: DC | PRN
  Filled 2015-01-06: qty 5

## 2015-01-06 MED ORDER — LIDOCAINE HCL (PF) 1 % IJ SOLN: 5.0000 mL | INTRAMUSCULAR | Status: DC | PRN

## 2015-01-06 MED ORDER — LEVALBUTEROL HCL 0.63 MG/3ML IN NEBU
0.6300 mg | INHALATION_SOLUTION | Freq: Once | RESPIRATORY_TRACT | Status: AC
  Administered 2015-01-06: 0.63 mg via RESPIRATORY_TRACT
  Filled 2015-01-06: qty 3

## 2015-01-06 MED ORDER — SODIUM CHLORIDE 0.9 % IV SOLN: 100.0000 mL | INTRAVENOUS | Status: DC | PRN

## 2015-01-06 MED ORDER — NALOXONE HCL 0.4 MG/ML IJ SOLN
0.4000 mg | INTRAMUSCULAR | Status: DC | PRN
Start: 2015-01-06 — End: 2015-01-06

## 2015-01-06 MED ORDER — HYDROMORPHONE 0.3 MG/ML IV SOLN: INTRAVENOUS | Status: DC

## 2015-01-06 MED ORDER — ALTEPLASE 2 MG IJ SOLR
2.0000 mg | Freq: Once | INTRAMUSCULAR | Status: AC | PRN
  Filled 2015-01-06: qty 2

## 2015-01-06 MED ORDER — SODIUM CHLORIDE 0.9 % IJ SOLN: 9.0000 mL | INTRAMUSCULAR | Status: DC | PRN

## 2015-01-06 MED ORDER — NEPRO/CARBSTEADY PO LIQD: 237.0000 mL | ORAL | Status: DC | PRN

## 2015-01-06 MED ORDER — HEPARIN SODIUM (PORCINE) 1000 UNIT/ML DIALYSIS
1000.0000 [IU] | INTRAMUSCULAR | Status: DC | PRN
  Filled 2015-01-06: qty 1

## 2015-01-06 MED ORDER — DIPHENHYDRAMINE HCL 50 MG/ML IJ SOLN: 12.5000 mg | Freq: Four times a day (QID) | INTRAMUSCULAR | Status: DC | PRN

## 2015-01-06 MED ORDER — METHYLPREDNISOLONE SODIUM SUCC 125 MG IJ SOLR
60.0000 mg | Freq: Once | INTRAMUSCULAR | Status: AC
  Administered 2015-01-06: 60 mg via INTRAVENOUS

## 2015-01-06 MED ORDER — MORPHINE SULFATE 2 MG/ML IJ SOLN
INTRAMUSCULAR | Status: AC
Start: 2015-01-06 — End: 2015-01-06
  Administered 2015-01-06: 1 mg via INTRAVENOUS
  Filled 2015-01-06: qty 1

## 2015-01-06 MED ORDER — DIPHENHYDRAMINE HCL 12.5 MG/5ML PO ELIX
12.5000 mg | ORAL_SOLUTION | Freq: Four times a day (QID) | ORAL | Status: DC | PRN
  Filled 2015-01-06: qty 5

## 2015-01-06 MED ORDER — IPRATROPIUM-ALBUTEROL 0.5-2.5 (3) MG/3ML IN SOLN
3.0000 mL | RESPIRATORY_TRACT | Status: DC
  Administered 2015-01-06 – 2015-01-13 (×39): 3 mL via RESPIRATORY_TRACT
  Filled 2015-01-06 (×42): qty 3

## 2015-01-06 MED ORDER — OXYCODONE-ACETAMINOPHEN 5-325 MG PO TABS
ORAL_TABLET | ORAL | Status: AC
  Filled 2015-01-06: qty 1

## 2015-01-06 MED ORDER — HEPARIN SODIUM (PORCINE) 1000 UNIT/ML DIALYSIS
20.0000 [IU]/kg | INTRAMUSCULAR | Status: DC | PRN
  Filled 2015-01-06: qty 1

## 2015-01-06 MED ORDER — VANCOMYCIN HCL IN DEXTROSE 750-5 MG/150ML-% IV SOLN
750.0000 mg | Freq: Once | INTRAVENOUS | Status: AC
  Administered 2015-01-06: 750 mg via INTRAVENOUS
  Filled 2015-01-06: qty 150

## 2015-01-06 MED ORDER — METHYLPREDNISOLONE SODIUM SUCC 125 MG IJ SOLR
INTRAMUSCULAR | Status: AC
  Filled 2015-01-06: qty 2

## 2015-01-06 MED ORDER — PENTAFLUOROPROP-TETRAFLUOROETH EX AERO: 1.0000 "application " | INHALATION_SPRAY | CUTANEOUS | Status: DC | PRN

## 2015-01-06 NOTE — Consult Note (Addendum)
Physical Medicine and Rehabilitation Consult Reason for Consult: Right BKA Referring Physician: [facility]   HPI: Randy [name] is a 81 y.o. right handed male with history of end-stage renal disease with peritoneal dialysis followed by Dr. Hinda Lenis, COPD with home oxygen, CAD/NSTEMI. Patient lives with his wife use an occasional cane prior to admission. Admitted 12/30/2011 with ischemic changes right foot. By report patient with a fall 2 weeks ago hitting his left foot with skin lacerations at the toes. He was placed on Keflex. Noted progressive ischemic changes cellulitis placed on broad-spectrum antibiotics. Vascular study showed evidence of significant peripheral small vessel tibial disease. No change with conservative care and underwent right below-knee amputation 01/05/2015 per Dr. Scot Dock. Hospital course pain management. Renal service follow-up for dialysis as advised. Acute on chronic anemia continued on Aranesp with latest hemoglobin 8.1. Subcutaneous heparin for DVT prophylaxis. Physical and occupational therapy evaluations are pending. M.D. has requested physical medicine rehabilitation consult   Review of Systems  Cardiovascular: Positive for leg swelling.  Gastrointestinal: Positive for constipation.  Musculoskeletal: Positive for myalgias and joint pain.  All other systems reviewed and are negative.  Past Medical History  Diagnosis Date  . Hypertensive heart disease   . Hypercholesterolemia   . Gout   . Secondary cardiomyopathy     LVEF 40-45%  . Anemia of chronic disease   . Hypertension   . Arthritis   . Peripheral vascular disease   . ESRD on peritoneal dialysis   . On home oxygen therapy     "use what I need to" (08/17/2014)  . Pneumonia 09/2012  . Pericarditis 08/17/2014  . NSTEMI (non-ST elevated myocardial infarction) 08/17/2014   Past Surgical History  Procedure Laterality Date  . Knee arthroscopy Right 2007  . Back surgery    . Hemorrhoid surgery   1970's  . Ganglion cyst excision  01/03/2012    Procedure: REMOVAL GANGLION OF WRIST;  Surgeon: Scherry Ran, MD;  Location: [facility];  Service: General;  Laterality: Right;  . Colonoscopy    . Av fistula placement  08/24/2012    Procedure: ARTERIOVENOUS (AV) FISTULA CREATION;  Surgeon: Rosetta Posner, MD;  Location: [place];  Service: Vascular;  Laterality: Left;  . Insertion of dialysis catheter Right      neck  . Insertion of dialysis catheter  10/19/2012    Procedure: INSERTION OF DIALYSIS CATHETER;  Surgeon: Rosetta Posner, MD;  Location: [place];  Service: Vascular;  Laterality: N/A;  REMOVE TEMPORARY CATH  . Cardiac catheterization  08/17/2014  . Cholecystectomy    . Lumbar disc surgery  2004  . Left heart catheterization with coronary angiogram N/A 08/17/2014    Procedure: LEFT HEART CATHETERIZATION WITH CORONARY ANGIOGRAM;  Surgeon: Larey Dresser, MD;  Location: [facility] CATH LAB;  Service: Cardiovascular;  Laterality: N/A;  . Colonoscopy Left 09/26/2014    Procedure: COLONOSCOPY;  Surgeon: Arta Silence, MD;  Location: [facility] ENDOSCOPY;  Service: Endoscopy;  Laterality: Left;   Family History  Problem Relation Age of Onset  . Arthritis    . Cancer    . Kidney disease    . Anesthesia problems Neg Hx   . Hypotension Neg Hx   . Malignant hyperthermia Neg Hx   . Pseudochol deficiency Neg Hx   . Cancer Sister   . Cancer Brother     colon  . Cancer Brother     colon   Social History:  reports that he quit smoking about  16 years ago. His smoking use included Cigarettes. He has a 30 pack-year smoking history. He quit smokeless tobacco use about 21 years ago. His smokeless tobacco use included Chew. He reports that he does not drink alcohol or use illicit drugs. Allergies: No Known Allergies Medications Prior to Admission  Medication Sig Dispense Refill  . aspirin 325 MG tablet Take 1 tablet (325 mg total) by mouth 2 (two) times daily. (Patient taking differently: Take 325 mg by mouth daily. )     . calcitRIOL (ROCALTROL) 0.5 MCG capsule Take 1 capsule (0.5 mcg total) by mouth daily. 30 capsule 3  . carvedilol (COREG) 25 MG tablet Take 1 tablet (25 mg total) by mouth 2 (two) times daily with a meal. (Patient taking differently: Take 25 mg by mouth 2 (two) times daily as needed (blood pressure >130/27). ) 60 tablet 3  . cephALEXin (KEFLEX) 250 MG capsule Take 250 mg by mouth 4 (four) times daily. Starting 12/26/2014 x 7 days.  0  . cloNIDine (CATAPRES) 0.2 MG tablet Take 0.2 mg by mouth 2 (two) times daily as needed (blood pressure >130/27).   10  . COLCRYS 0.6 MG tablet Take 1 tablet by mouth daily as needed (gout flare up).   2  . feeding supplement, RESOURCE BREEZE, (RESOURCE BREEZE) LIQD Take 1 Container by mouth 3 (three) times daily between meals. 90 Container 5  . hydrALAZINE (APRESOLINE) 50 MG tablet Take 50 mg by mouth 3 (three) times daily.   10  . labetalol (NORMODYNE) 200 MG tablet Take 2 tablets (400 mg total) by mouth 2 (two) times daily. (Patient taking differently: Take 200 mg by mouth at bedtime. ) 120 tablet 6  . lisinopril (PRINIVIL,ZESTRIL) 10 MG tablet Take 1 tablet (10 mg total) by mouth daily. (Patient taking differently: Take 10 mg by mouth daily as needed (blood pressure >130/27). ) 30 tablet 3  . multivitamin (RENA-VIT) TABS tablet Take 1 tablet by mouth daily.    Marland Kitchen oxyCODONE-acetaminophen (PERCOCET/ROXICET) 5-325 MG per tablet Take 1 tablet by mouth every 6 (six) hours as needed for severe pain. 15 tablet 0  . pantoprazole (PROTONIX) 40 MG tablet Take 1 tablet (40 mg total) by mouth daily at 12 noon. (Patient taking differently: Take 40 mg by mouth daily as needed (acid reflux). ) 30 tablet 11  . predniSONE (STERAPRED UNI-PAK) 10 MG tablet Take 10-60 tablets by mouth See admin instructions. Take 6 tabs x 2 days, 5 tabs x 2 days, 4 tabs x 2 days, 3 tabs x 2 days, 2 tabs x 2 days and then 1 tab x 2 days.  0  . rOPINIRole (REQUIP) 0.25 MG tablet Take 0.25-0.5 mg by mouth  at bedtime as needed (restless legs). 1-2 tablets based on severity of restless legs    . senna-docusate (SENOKOT-S) 8.6-50 MG per tablet Take 1 tablet by mouth daily as needed for mild constipation.    . sevelamer carbonate (RENVELA) 800 MG tablet Take 1,600-2,400 mg by mouth See admin instructions. Take 3 tablets (2400 mg) with meals and 2 tablets (1600 mg) with snacks    . simvastatin (ZOCOR) 40 MG tablet Take 1 tablet (40 mg total) by mouth every Monday, Wednesday, and Friday. 30 tablet 11  . amLODipine (NORVASC) 10 MG tablet Take 1 tablet (10 mg total) by mouth daily. (Patient not taking: Reported on 12/31/2014) 30 tablet 3  . cloNIDine (CATAPRES) 0.1 MG tablet Take 1 tablet (0.1 mg total) by mouth 2 (two) times daily. (Patient  not taking: Reported on 12/31/2014) 60 tablet 3  . docusate sodium 100 MG CAPS Take 100 mg by mouth 2 (two) times daily. (Patient not taking: Reported on 12/27/2014) 10 capsule 0  . hydrALAZINE (APRESOLINE) 25 MG tablet Take 1 tablet (25 mg total) by mouth 3 (three) times daily. (Patient not taking: Reported on 12/27/2014) 90 tablet 3  . polyethylene glycol (MIRALAX / GLYCOLAX) packet Take 17 g by mouth daily. (Patient not taking: Reported on 12/27/2014) 30 each 0    Home: Home Living Family/patient expects to be discharged to:: Private residence Living Arrangements: Spouse/significant other Available Help at Discharge: Family, Available 24 hours/day Type of Home: House Home Access: Stairs to enter [employer] of Steps: 2 Entrance Stairs-Rails: None Home Layout: One level Home Equipment: Environmental consultant - 2 wheels, [employer] - quad, Civil engineer, contracting, Wheelchair - manual  Functional History: Prior Function Level of Independence: Independent with assistive device(s), Needs assistance Gait / Transfers Assistance Needed: Patient used quad cane for ambulation ADL's / Homemaking Assistance Needed: Assist for meal prep and housekeeping; wife completes IADL tasks at home  Comments:  Patient states he has both a walk-in shower and a tub/shower, wife assisted with transfers in/out tub/shower Functional Status:  Mobility: Bed Mobility Overal bed mobility: Needs Assistance Bed Mobility: Supine to Sit Supine to sit: Supervision Sit to supine: Min guard General bed mobility comments: Patient found seated in recliner upon OT entering room Transfers Overall transfer level: Needs assistance Equipment used: Rolling walker (2 wheeled) Transfers: Sit to/from Stand Sit to Stand: Min guard General transfer comment: Verbal cues for hand placement. Assist to steady during transition. Used RW instead of quad cane in order to work on single limb balance exercises. Ambulation/Gait Ambulation/Gait assistance: Min guard Ambulation Distance (Feet): 15 Feet Assistive device: Rolling walker (2 wheeled) Gait Pattern/deviations: Step-through pattern, Decreased weight shift to right, Decreased step length - left, Decreased stance time - right Gait velocity: Decreased Gait velocity interpretation: Below normal speed for age/gender General Gait Details: Pt instructed to ambulate with NWB of R LE in order to practice for his BKA scheduled for tomorrow. Demonstrated the activity well, but fatigued quickly after already performing UE ther ex.    ADL: ADL Overall ADL's : Needs assistance/impaired General ADL Comments: Patient overall supervision>min guard assist for functional tasks. Patient is able to cross BLEs for LB ADLs. Patient will potentially going through surgery to have a RAKA. As of now, plan to follow patient for overall safety prior to d/c>home. IF patient undergoes R AKA, recommending an acute OT re-evaluation.   Cognition: Cognition Overall Cognitive Status: Within Functional Limits for tasks assessed Orientation Level: Oriented X4 Cognition Arousal/Alertness: Awake/alert Behavior During Therapy: WFL for tasks assessed/performed Overall Cognitive Status: Within Functional  Limits for tasks assessed  Blood pressure 162/76, pulse 89, temperature 98.3 F (36.8 C), temperature source Oral, resp. rate 16, height 5\' 9"  (1.753 m), weight 35.607 kg (78 lb 8 oz), SpO2 95 %. Physical Exam  Vitals reviewed. HENT:  Head: Normocephalic.  Eyes: EOM are normal.  Neck: Normal range of motion. Neck supple. No thyromegaly present.  Cardiovascular: Normal rate and regular rhythm.   Respiratory: Effort normal and breath sounds normal.  GI: Soft. Bowel sounds are normal. He exhibits no distension.  Neurological:  Patient is alert and a bit anxious. Oriented 3 and follows basic commands. UE's 4/5 prox to distal. Cannot lift RLE off bed. LLE: 3+hf, 4/5 knee/ankle. Decreased LT distally LLE  Skin:  Right BKA site is  dressed appropriately tender  Psychiatric: He has a normal mood and affect. His behavior is normal.    Results for orders placed or performed during the hospital encounter of 12/30/14 (from the past 24 hour(s))  Protime-INR     Status: None   Collection Time: 01/05/15  6:10 AM  Result Value Ref Range   Prothrombin Time 13.6 11.6 - 15.2 seconds   INR 1.03 0.00 - 5.27  Basic metabolic panel     Status: Abnormal   Collection Time: 01/05/15  6:10 AM  Result Value Ref Range   Sodium 138 135 - 145 mmol/L   Potassium 4.1 3.5 - 5.1 mmol/L   Chloride 100 96 - 112 mmol/L   CO2 29 19 - 32 mmol/L   Glucose, Bld 83 70 - 99 mg/dL   BUN 15 6 - 23 mg/dL   Creatinine, Ser 4.13 (H) 0.50 - 1.35 mg/dL   Calcium 8.5 8.4 - 10.5 mg/dL   GFR calc non Af Amer 12 (L) >90 mL/min   GFR calc Af Amer 14 (L) >90 mL/min   Anion gap 9 5 - 15  CBC     Status: Abnormal   Collection Time: 01/05/15  6:10 AM  Result Value Ref Range   WBC 7.4 4.0 - 10.5 K/uL   RBC 2.69 (L) 4.22 - 5.81 MIL/uL   Hemoglobin 8.1 (L) 13.0 - 17.0 g/dL   HCT 26.6 (L) 39.0 - 52.0 %   MCV 98.9 78.0 - 100.0 fL   MCH 30.1 26.0 - 34.0 pg   MCHC 30.5 30.0 - 36.0 g/dL   RDW 18.6 (H) 11.5 - 15.5 %   Platelets 285  150 - 400 K/uL   No results found.  Assessment/Plan: Diagnosis: Right BKA 1. Does the need for close, 24 hr/day medical supervision in concert with the patient's rehab needs make it unreasonable for this patient to be served in a less intensive setting? Yes 2. Co-Morbidities requiring supervision/potential complications: esrd, chf, cad 3. Due to bladder management, bowel management, safety, skin/wound care, disease management, medication administration, pain management and patient education, does the patient require 24 hr/day rehab nursing? Yes 4. Does the patient require coordinated care of a physician, rehab nurse, PT (1-2 hrs/day, 5 days/week) and OT (1-2 hrs/day, 5 days/week) to address physical and functional deficits in the context of the above medical diagnosis(es)? Yes Addressing deficits in the following areas: balance, endurance, locomotion, strength, transferring, bowel/bladder control, bathing, dressing, feeding, grooming, toileting and psychosocial support 5. Can the patient actively participate in an intensive therapy program of at least 3 hrs of therapy per day at least 5 days per week? Yes 6. The potential for patient to make measurable gains while on inpatient rehab is excellent 7. Anticipated functional outcomes upon discharge from inpatient rehab are modified independent and supervision  with PT, modified independent and supervision with OT, n/a with SLP. 8. Estimated rehab length of stay to reach the above functional goals is: 8-13 days potentially 9. Does the patient have adequate social supports and living environment to accommodate these discharge functional goals? Yes 10. Anticipated D/C setting: Home 11. Anticipated post D/C treatments: HH therapy and Outpatient therapy 12. Overall Rehab/Functional Prognosis: excellent  RECOMMENDATIONS: This patient's condition is appropriate for continued rehabilitative care in the following setting: CIR Patient has agreed to  participate in recommended program. Yes Note that insurance prior authorization may be required for reimbursement for recommended care.  Comment: Will follow along as patient begins to mobilize post-operatively.  Meredith Staggers, MD, [facility] 01/06/2015     01/06/2015

## 2015-01-06 NOTE — Progress Notes (Signed)
Following up on Mr. Sweeney from this am.  HD RN just now trialing patient off BIPAP.  PAtient states he feels much better.  AS per HD RN Dr. Tyrell Antonio had been up to assess patient.  RN to call if assistance needed

## 2015-01-06 NOTE — Progress Notes (Signed)
ANTIBIOTIC CONSULT NOTE - FOLLOW UP  Pharmacy Consult for Vanc/Zosyn Indication: cellulitis - failed outpatient abx  No Known Allergies  Labs:  Recent Labs  01/04/15 1900 01/05/15 0610 01/06/15 0520  WBC 7.9 7.4 10.3  HGB 8.2* 8.1* 8.5*  PLT 303 285 331  CREATININE 4.73* 4.13* 6.04*   Estimated Creatinine Clearance: 9.6 mL/min (by C-G formula based on Cr of 6.04).  Recent Labs  01/06/15 1341  VANCORANDOM 11.3     Assessment: 17 YOM with hx of ESRD- usually on PD (peritoneal dialysis but has received hemodialysis x 3 this week) presented to the ED with right foot pain and redness, failed outpatient treatment with PO keflex. Pharmacy is consulted to start vancomycin and zosyn. Abx D#6, WBC wnl, afebrile. Plan for R-BKA on 4/14. Having HD this PM off schedule.   Random vancomycin level = 11.3 (sub-therapeutic)  Goal of Therapy:  Pre-HD Vanc Level 15-67mcg/mL  Plan:  Extra dose of Vancomycin 750 mg iv X 1 today Then back on normal schedule   Thank you. Anette Guarneri, PharmD [phone] 01/06/2015 .5:10 PM

## 2015-01-06 NOTE — Progress Notes (Signed)
Patient transferred to Room [facility] Room 15. Report given to [facility].

## 2015-01-06 NOTE — Progress Notes (Addendum)
  [facility] Progress Note  01/06/2015 8:28 AM 1 Day Post-Op  Subjective:  Did not sleep well last night due to pain.    Filed Vitals:   01/06/15 0759  BP: 160/82  Pulse: 93  Temp: 98.1 F (36.7 C)  Resp: 17    Physical Exam: Right BKA dressing clean and dry.   CBC    Component Value Date/Time   WBC 10.3 01/06/2015 0520   RBC 2.80* 01/06/2015 0520   RBC 3.21* 10/08/2012 2241   HGB 8.5* 01/06/2015 0520   HCT 28.1* 01/06/2015 0520   PLT 331 01/06/2015 0520   MCV 100.4* 01/06/2015 0520   MCH 30.4 01/06/2015 0520   MCHC 30.2 01/06/2015 0520   RDW 18.8* 01/06/2015 0520   LYMPHSABS 0.7 12/30/2014 1754   MONOABS 0.9 12/30/2014 1754   EOSABS 0.0 12/30/2014 1754   BASOSABS 0.0 12/30/2014 1754    BMET    Component Value Date/Time   NA 134* 01/06/2015 0520   K 4.9 01/06/2015 0520   CL 97 01/06/2015 0520   CO2 23 01/06/2015 0520   GLUCOSE 104* 01/06/2015 0520   BUN 26* 01/06/2015 0520   CREATININE 6.04* 01/06/2015 0520   CALCIUM 9.1 01/06/2015 0520   CALCIUM 8.1* 06/24/2012 1634   GFRNONAA 8* 01/06/2015 0520   GFRAA 9* 01/06/2015 0520    INR    Component Value Date/Time   INR 1.03 01/05/2015 0610     Intake/Output Summary (Last 24 hours) at 01/06/15 0828 Last data filed at 01/06/15 0533  Gross per 24 hour  Intake   1340 ml  Output      0 ml  Net   1340 ml     Assessment:  81 y.o. male is s/p: right below-knee amputation 1 Day Post-Op  Plan: -Dressing clean. Will take down dressing tomorrow. -Pain poorly controlled. Will adjust percocet to 1-2 tablets q 4 prn.   Virgina Jock, PA-C [facility] Office: [phone] Pager: [phone] 01/06/2015 8:28 AM   Agree with above.  Will get his pain under better control with Dilaudid PCA. Dressing change tomorrow.  [name], MD, [place] [phone] Office: [phone]

## 2015-01-06 NOTE — Progress Notes (Signed)
OT Cancellation Note  Patient Details Name: Randy [name] MRN: [id number] DOB: September 15, 1932   Cancelled Treatment:    Reason Eval/Treat Not Completed: Medical issues which prohibited therapy (Pt experiencing respiratory issues, plan for emergent HD.) Will continue to follow.  [name] 01/06/2015, 11:55 AM

## 2015-01-06 NOTE — Progress Notes (Signed)
Utilization review completed. Norvin Ohlin, RN, BSN. 

## 2015-01-06 NOTE — Progress Notes (Signed)
PROGRESS NOTE  Randy [name] UXN:[id number] DOB: 1932/08/25 DOA: 12/30/2014 PCP: [name], MD  Assessment/Plan: Acute  Respiratory failure:  Patient with increase work of breathing this morning. Diffuse wheezing and crackles lung exam.  BIPAP ordered, nebulizer treatments. Start chest x ray, ABG. EKG. IV solumedrol one dose.  Renal informed of change in patient condition and Needs for  dialysis.  Patient improved after he was started on BIPAP, breathing more comfortable.  Patient was taken to dialysis. Will need to reassess after dialysis respiratory status, might need to be transfer to step down if he is still requiring BIPAP.   Right foot pain:  -treated outpatient with Keflex due to suspected cellulitis,without improvement.  - had arterial Seg signal doppler study on 12/27/14, which showed evidence of significant peripheral small vessel tibial disease. -IV abx, continue with Vancomycin.  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 -Discussed with Dr.  Scot Dock, continue with IV antibiotics for 48 hours post surgery.   ESRD-HD with hyperkalemia at admission: renal for HD Emergent dialysis today.   Hypertension: -continue home medications: Amlodipine, clonidine, hydralazine, Coreg -hold lisinopril prior surgery.   Hyperlipidemia: -Lipitor  GERD: -Protonix  CAD: Currently no chest pain. EKG has no new change. -Continue aspirin and Lipitor  Kidney mass:  incidental finding on his CT scan was a 2 cm enhancing mass in the midpole of the left kidney concerning for possible neoplasm or malignancy. MRI: Follow up CT without and with contrast in 1 year suggested to assess the stability of this lesion.  DVT prophylaxis: heparin.   Code Status: full Family Communication: patient/ and multiples family member at bedside.  Disposition Plan: BIPAP, emergent dialysis.    Consultants:  Vascular  Nephrologist   Procedures: right below the knee amputation  4-14.   HPI/Subjective: Patient not feeling well. He is having increase SOB, increase work of breathing.  He denies chest pain or abdominal pain.   Objective: Filed Vitals:   01/06/15 0759  BP: 160/82  Pulse: 93  Temp: 98.1 F (36.7 C)  Resp: 17    Intake/Output Summary (Last 24 hours) at 01/06/15 1115 Last data filed at 01/06/15 0839  Gross per 24 hour  Intake   1580 ml  Output      0 ml  Net   1580 ml   Filed Weights   01/03/15 2140 01/04/15 2218 01/05/15 2143  Weight: 75.841 kg (167 lb 3.2 oz) 74.9 kg (165 lb 2 oz) 35.607 kg (78 lb 8 oz)    Exam:   General:  Patient is in acute distress, using accessory muscle to breath.   Cardiovascular: rrr  Respiratory: diffuse crackles and wheezing.   Abdomen: +BS, soft  Musculoskeletal: dressing in place.   Data Reviewed: Basic Metabolic Panel:  Recent Labs Lab 12/31/14 1210 01/02/15 0447 01/03/15 1115 01/04/15 1900 01/05/15 0610 01/06/15 0520  NA  --  132* 129* 133* 138 134*  K  --  3.7 3.6 3.0* 4.1 4.9  CL  --  95* 89* 94* 100 97  CO2  --  23 24 27 29 23   GLUCOSE  --  118* 147* 135* 83 104*  BUN  --  39* 61* 23 15 26*  CREATININE  --  6.94* 9.29* 4.73* 4.13* 6.04*  CALCIUM  --  8.6 8.7 8.2* 8.5 9.1  PHOS 6.7*  --  5.8*  --   --   --    Liver Function Tests:  Recent Labs Lab 12/30/14 1754 12/31/14 0432 01/03/15 1115  AST 48* 29  --   ALT 47 41  --   ALKPHOS 83 65  --   BILITOT 0.4 0.5  --   PROT 5.8* 5.9*  --   ALBUMIN 2.9* 2.7* 2.1*   No results for input(s): LIPASE, AMYLASE in the last 168 hours. No results for input(s): AMMONIA in the last 168 hours. CBC:  Recent Labs Lab 12/30/14 1754  01/02/15 0447 01/03/15 1114 01/04/15 1900 01/05/15 0610 01/06/15 0520  WBC 14.4*  < > 9.8 7.8 7.9 7.4 10.3  NEUTROABS 12.8*  --   --   --   --   --   --   HGB 8.3*  < > 8.5* 7.8* 8.2* 8.1* 8.5*  HCT 27.1*  < > 28.3* 25.3* 26.4* 26.6* 28.1*  MCV 97.8  < > 98.3 95.1 96.4 98.9 100.4*  PLT 290  < >  306 291 303 285 331  < > = values in this interval not displayed. Cardiac Enzymes: No results for input(s): CKTOTAL, CKMB, CKMBINDEX, TROPONINI in the last 168 hours. BNP (last 3 results) No results for input(s): BNP in the last 8760 hours.  ProBNP (last 3 results)  Recent Labs  06/21/14 1046 08/17/14 0625 08/29/14 0928  PROBNP >70000.0* >70000.0* >70000.0*    CBG: No results for input(s): GLUCAP in the last 168 hours.  Recent Results (from the past 240 hour(s))  Blood culture (routine x 2)     Status: None   Collection Time: 12/30/14  5:54 PM  Result Value Ref Range Status   Specimen Description BLOOD RIGHT FOREARM  Final   Special Requests BOTTLES DRAWN AEROBIC AND ANAEROBIC 10CC  Final   Culture   Final    NO GROWTH 5 DAYS Performed at [facility]    Report Status 01/06/2015 FINAL  Final  Blood culture (routine x 2)     Status: None   Collection Time: 12/30/14  6:06 PM  Result Value Ref Range Status   Specimen Description BLOOD HAND RIGHT  Final   Special Requests BOTTLES DRAWN AEROBIC AND ANAEROBIC 5CC  Final   Culture   Final    NO GROWTH 5 DAYS Performed at [facility]    Report Status 01/06/2015 FINAL  Final  MRSA PCR Screening     Status: None   Collection Time: 12/31/14  1:11 AM  Result Value Ref Range Status   MRSA by PCR NEGATIVE NEGATIVE Final    Comment:        The GeneXpert MRSA Assay (FDA approved for NASAL specimens only), is one component of a comprehensive MRSA colonization surveillance program. It is not intended to diagnose MRSA infection nor to guide or monitor treatment for MRSA infections.   Surgical pcr screen     Status: None   Collection Time: 01/04/15 10:54 AM  Result Value Ref Range Status   MRSA, PCR NEGATIVE NEGATIVE Final   Staphylococcus aureus NEGATIVE NEGATIVE Final    Comment:        The Xpert SA Assay (FDA approved for NASAL specimens in patients over 37 years of age), is one component of a  comprehensive surveillance program.  Test performance has been validated by [facility] for patients greater than or equal to 34 year old. It is not intended to diagnose infection nor to guide or monitor treatment.      Studies: No results found.  Scheduled Meds: . amLODipine  10 mg Oral Daily  . aspirin  325 mg Oral Daily  .  atorvastatin  40 mg Oral q1800  . calcitRIOL  0.5 mcg Oral Daily  . carvedilol  25 mg Oral BID WC  . cloNIDine  0.2 mg Oral BID  . darbepoetin (ARANESP) injection - DIALYSIS  100 mcg Intravenous Q Sat-HD  . docusate sodium  100 mg Oral Daily  . feeding supplement (RESOURCE BREEZE)  1 Container Oral TID BM  . heparin  5,000 Units Subcutaneous 3 times per day  . ipratropium-albuterol  3 mL Nebulization Q4H  . multivitamin  1 tablet Oral Daily  . pantoprazole  40 mg Oral Daily  . piperacillin-tazobactam (ZOSYN)  IV  2.25 g Intravenous Q8H  . rOPINIRole  0.25-0.5 mg Oral QHS  . sevelamer carbonate  4,000 mg Oral TID WC  . sodium chloride  3 mL Intravenous Q12H  . [START ON 01/07/2015] vancomycin  750 mg Intravenous Q T,Th,Sa-HD   Continuous Infusions: . sodium chloride 10 mL/hr (01/05/15 1103)  . sodium chloride     Antibiotics Given (last 72 hours)    Date/Time Action Medication Dose Rate   01/03/15 1200 Given  [Given in Dialysis]   vancomycin (VANCOCIN) IVPB 750 mg/150 ml premix 750 mg 150 mL/hr   01/03/15 1630 Given   piperacillin-tazobactam (ZOSYN) IVPB 2.25 g 2.25 g 100 mL/hr   01/03/15 2217 Given   piperacillin-tazobactam (ZOSYN) IVPB 2.25 g 2.25 g 100 mL/hr   01/04/15 0549 Given   piperacillin-tazobactam (ZOSYN) IVPB 2.25 g 2.25 g 100 mL/hr   01/04/15 1350 Given   piperacillin-tazobactam (ZOSYN) IVPB 2.25 g 2.25 g 100 mL/hr   01/04/15 1614 Given   vancomycin (VANCOCIN) IVPB 750 mg/150 ml premix 750 mg 150 mL/hr   01/04/15 2304 Given   piperacillin-tazobactam (ZOSYN) IVPB 2.25 g 2.25 g 100 mL/hr   01/05/15 0626 Given    piperacillin-tazobactam (ZOSYN) IVPB 2.25 g 2.25 g 100 mL/hr   01/05/15 2203 Given   piperacillin-tazobactam (ZOSYN) IVPB 2.25 g 2.25 g 100 mL/hr   01/06/15 0529 Given   piperacillin-tazobactam (ZOSYN) IVPB 2.25 g 2.25 g 100 mL/hr      Principal Problem:   Foot pain, right Active Problems:   HLD (hyperlipidemia)   Polycystic kidney   Lower GI bleed   ESRD (end stage renal disease) on dialysis   Foot pain   CHF (congestive heart failure)   CAD (coronary artery disease)   Hyperkalemia   Renal mass    Time spent: 35 min    Serenitee Fuertes A  [facility] Pager [phone] If 7PM-7AM, please contact night-coverage at [email or web address], password [facility] 01/06/2015, 11:15 AM  LOS: 7 days

## 2015-01-06 NOTE — Procedures (Signed)
Asked to see due to SOB and wheezing after placed on BiPAP.  Neck veins bulging. CXR shows pulm edema and l pl effusion.  Did get a liter of IVF yesterday. Emergency dialysis in progress.  Pt already better with <200cc off an BiPAP,  Will need to reassess for new EDW.   Assessment: 1. Cellulitis and ischemic Rt foot s/p BKA 4/14 2. ESRD--Plan for today off sched per request of [facility] 3. Sec HPTH on calcitriol 4. Anemia on aranesp 5. HTN 6. PVD 7. 2cm lesion Rt kidney  .Remmy Riffe C

## 2015-01-06 NOTE — Progress Notes (Signed)
PT Cancellation Note  Patient Details Name: JAHSEH LUCCHESE MRN: [id number] DOB: [birth year]   Cancelled Treatment:    Reason Eval/Treat Not Completed: Medical issues which prohibited therapy.  Pt. With breathing difficulties and is going to HD emeregently on Bipap.  Will check on status tomorrow.     Ladona Ridgel 01/06/2015, 11:39 AM Gerlean Ren PT Acute Rehab Services [phone] Beeper [phone]

## 2015-01-06 NOTE — Significant Event (Signed)
Rapid Response Event Note  Overview: Time Called: 1111 Arrival Time: 1115 Event Type: Respiratory  Initial Focused Assessment:  Called by Dr. Tyrell Antonio for assistance with patient requiring bipap with Respiratory distress.  Upon my arrival to patients room, RN, MD and family at bedside.  sats are 96% on nasal cannula, however patient has increased WOB with belly breathing noted.  RR 32.     Interventions:  RT called for bipap, breathing treatment and ABG.  PCXR notified and obtained.  Breathing treatment administered by RN.  Placed on BIPAP as per RT.  Patient to have HD soon, patient placement updated on patient needing SDU.    Event Summary:  Patient transported to HD suite by HD RN.  Will reassess patient near end of HD to evaluate need for bipap/SDU.   at      at          [facility], Harlin Rain

## 2015-01-06 NOTE — Progress Notes (Signed)
Patient with light wheezing bilateral lower lobes.  Respiratory called for 8am treatment past due.  Around 10am, patient belly breathing with o2 sat 96% on 2 liters.  MD at bedside and rapid response notified due to change in patient's breathing.  Stat abg ordered, chest xray, and patient placed on BIPAP. IV solumedrol given.  Nephrology at bedside and placing orders for hemodialysis.  Patient taken to dialysis on BIPAP. Family at bedside and aware

## 2015-01-07 DIAGNOSIS — I5020 Unspecified systolic (congestive) heart failure: Secondary | ICD-10-CM

## 2015-01-07 LAB — BASIC METABOLIC PANEL WITH GFR
Anion gap: 14 (ref 5–15)
BUN: 21 mg/dL (ref 6–23)
CO2: 26 mmol/L (ref 19–32)
Calcium: 8.8 mg/dL (ref 8.4–10.5)
Chloride: 96 mmol/L (ref 96–112)
Creatinine, Ser: 4.67 mg/dL — ABNORMAL HIGH (ref 0.50–1.35)
GFR calc Af Amer: 12 mL/min — ABNORMAL LOW
GFR calc non Af Amer: 11 mL/min — ABNORMAL LOW
Glucose, Bld: 107 mg/dL — ABNORMAL HIGH (ref 70–99)
Potassium: 4.4 mmol/L (ref 3.5–5.1)
Sodium: 136 mmol/L (ref 135–145)

## 2015-01-07 LAB — CBC
HCT: 24.5 % — ABNORMAL LOW (ref 39.0–52.0)
HEMOGLOBIN: 7.5 g/dL — AB (ref 13.0–17.0)
MCH: 30.2 pg (ref 26.0–34.0)
MCHC: 30.6 g/dL (ref 30.0–36.0)
MCV: 98.8 fL (ref 78.0–100.0)
Platelets: 261 10*3/uL (ref 150–400)
RBC: 2.48 MIL/uL — ABNORMAL LOW (ref 4.22–5.81)
RDW: 18.6 % — ABNORMAL HIGH (ref 11.5–15.5)
WBC: 8.5 10*3/uL (ref 4.0–10.5)

## 2015-01-07 MED ORDER — VANCOMYCIN HCL IN DEXTROSE 750-5 MG/150ML-% IV SOLN
750.0000 mg | INTRAVENOUS | Status: DC
  Administered 2015-01-09: 750 mg via INTRAVENOUS
  Filled 2015-01-07 (×2): qty 150

## 2015-01-07 MED ORDER — POLYETHYLENE GLYCOL 3350 17 G PO PACK
17.0000 g | PACK | Freq: Every day | ORAL | Status: DC
  Administered 2015-01-07 – 2015-01-13 (×6): 17 g via ORAL
  Filled 2015-01-07 (×7): qty 1

## 2015-01-07 MED ORDER — OXYCODONE-ACETAMINOPHEN 5-325 MG PO TABS
ORAL_TABLET | ORAL | Status: AC
  Filled 2015-01-07: qty 1

## 2015-01-07 MED ORDER — ALBUTEROL SULFATE (2.5 MG/3ML) 0.083% IN NEBU
2.5000 mg | INHALATION_SOLUTION | RESPIRATORY_TRACT | Status: DC | PRN
  Administered 2015-01-13: 2.5 mg via RESPIRATORY_TRACT

## 2015-01-07 MED ORDER — DARBEPOETIN ALFA 100 MCG/0.5ML IJ SOSY
PREFILLED_SYRINGE | INTRAMUSCULAR | Status: AC
Start: 2015-01-07 — End: 2015-01-07
  Filled 2015-01-07: qty 0.5

## 2015-01-07 MED ORDER — SORBITOL 70 % SOLN
30.0000 mL | Freq: Every day | Status: DC | PRN
  Filled 2015-01-07: qty 30

## 2015-01-07 MED ORDER — CETYLPYRIDINIUM CHLORIDE 0.05 % MT LIQD
7.0000 mL | Freq: Two times a day (BID) | OROMUCOSAL | Status: DC
  Administered 2015-01-07 – 2015-01-13 (×9): 7 mL via OROMUCOSAL

## 2015-01-07 MED ORDER — WHITE PETROLATUM GEL
Status: AC
  Administered 2015-01-07: 0.2
  Filled 2015-01-07: qty 1

## 2015-01-07 NOTE — Progress Notes (Signed)
Physical Therapy Evaluation Patient Details Name: Randy [name] MRN: [id number] DOB: 1932/01/31 Today's Date: 01/07/2015   History of Present Illness  Patient is an 81 yo male admitted 12/30/14 with Rt foot pain and cellulitis.  Patient had recent fall and injured Rt foot.  Following vascular surgery work-up for peripheral vascular disease, pt is now s/p R BKA.  PMH:  HLD, HTN, ESRD now on HD, CHF, CAD, cardiomyopathy, NSTEMI  Clinical Impression  Patient demonstrates new deficits in functional mobility secondary to recent R BKA. Patient will need continued skilled PT to address deficits and maximize function. Will see as indicated and progress as tolerated. Will need further rehabilitation prior to d/c home.    Follow Up Recommendations CIR    Equipment Recommendations  Wheelchair (measurements PT);Wheelchair cushion (measurements PT)    Recommendations for Other Services Rehab consult     Precautions / Restrictions Precautions Precautions: Fall Precaution Comments: R BKA Restrictions Weight Bearing Restrictions: No      Mobility  Bed Mobility Overal bed mobility: Needs Assistance Bed Mobility: Supine to Sit     Supine to sit: Mod assist     General bed mobility comments: VCs for positioning and assist to rotate trunk and pull up to sitting  Transfers Overall transfer level: Needs assistance Equipment used: 2 person hand held assist Transfers: Sit to/from [employer] Transfers Sit to Stand: Mod assist;+2 physical assistance (performed x3) Stand pivot transfers: Max assist;+2 physical assistance;From elevated surface       General transfer comment: Verbal cues for hand placement. Assist to steady during transition. Used RW instead of quad cane in order to work on single limb balance exercises.  Ambulation/Gait                Stairs            Wheelchair Mobility    Modified Rankin (Stroke Patients Only)       Balance     Sitting  balance-Leahy Scale: Fair       Standing balance-Leahy Scale: Zero                               Pertinent Vitals/Pain Pain Assessment: Faces Faces Pain Scale: Hurts little more Pain Location: R residual limb Pain Descriptors / Indicators: Grimacing;Guarding Pain Intervention(s): Limited activity within patient's tolerance;Monitored during session;RN gave pain meds during session    [place] expects to be discharged to:: Private residence Living Arrangements: Spouse/significant other Available Help at Discharge: Family;Available 24 hours/day Type of Home: House Home Access: Stairs to enter Entrance Stairs-Rails: None Entrance Stairs-Number of Steps: 2 Home Layout: One level Home Equipment: Walker - 2 wheels;Cane - quad;Shower seat;Wheelchair - manual      Prior Function Level of Independence: Independent with assistive device(s);Needs assistance   Gait / Transfers Assistance Needed: Patient used quad cane for ambulation  ADL's / Homemaking Assistance Needed: Assist for meal prep and housekeeping; wife completes IADL tasks at home   Comments: Patient states he has both a walk-in shower and a tub/shower, wife assisted with transfers in/out tub/shower     Hand Dominance   Dominant Hand: Right    Extremity/Trunk Assessment   Upper Extremity Assessment: Generalized weakness           Lower Extremity Assessment: RLE deficits/detail RLE Deficits / Details: new Right BKA    Cervical / Trunk Assessment: Kyphotic  Communication   Communication: No difficulties  Cognition  Arousal/Alertness: Awake/alert Behavior During Therapy: WFL for tasks assessed/performed Overall Cognitive Status: Within Functional Limits for tasks assessed                      General Comments General comments (skin integrity, edema, etc.): educated on positioning, edema control, use of shaper and sensory stimulation.     Exercises General Exercises -  Lower Extremity Quad Sets: AROM;Right;5 reps      Assessment/Plan    PT Assessment Patient needs continued PT services  PT Diagnosis Difficulty walking;Abnormality of gait;Generalized weakness   PT Problem List Decreased strength;Decreased range of motion;Decreased activity tolerance;Decreased balance;Decreased mobility;Decreased knowledge of use of DME;Cardiopulmonary status limiting activity;Decreased skin integrity;Pain  PT Treatment Interventions DME instruction;Gait training;Functional mobility training;Therapeutic activities;Patient/family education   PT Goals (Current goals can be found in the Care Plan section) Acute Rehab PT Goals Patient Stated Goal: to get back home PT Goal Formulation: With patient Time For Goal Achievement: 01/18/15 Potential to Achieve Goals: Good    Frequency Min 3X/week   Barriers to discharge        Co-evaluation               End of Session Equipment Utilized During Treatment: Gait belt Activity Tolerance: Patient tolerated treatment well;Patient limited by fatigue Patient left: in chair;with call bell/phone within reach;with chair alarm set Nurse Communication: Mobility status         Time: 1020-1041 PT Time Calculation (min) (ACUTE ONLY): 21 min   Charges:   PT Evaluation $PT Re-evaluation: 1 Procedure     PT G CodesDuncan Dull Jan 31, 2015, 10:49 AM Alben Deeds, PT DPT  [phone]

## 2015-01-07 NOTE — Progress Notes (Signed)
PROGRESS NOTE  Randy [name] PPJ:[id number] DOB: 05/08/1932 DOA: 12/30/2014 PCP: [name], MD  Assessment/Plan: Acute  Respiratory failure: secondary to pulmonary edema. Systolic HF exacerbation. Patient with increase work of breathing this morning. Diffuse wheezing and crackles lung exam on 4-15.  BIPAP PRN, nebulizer treatments. IV solumedrol one dose.  Patient respiratory status improved with dialysis and nebulizer.  Patient improved after he was started on BIPAP. Now off BIPAP/  Continue with nebulizer treatments.   Right foot pain: S/P AKA.  -treated outpatient with Keflex due to suspected cellulitis,without improvement.  - had arterial Seg signal doppler study on 12/27/14, which showed evidence of significant peripheral small vessel tibial disease. -IV abx, continue with Vancomycin.  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 -Discussed with Dr.  Scot Dock, continue with IV antibiotics for 48 hours post surgery. Stop antibiotics 4-17.  Acute blood loss anemia; defer to renal blood transfusion, during dialysis.   ESRD-HD with hyperkalemia at admission: renal for HD Had Emergent dialysis 4-15. Due for dialysis today. If plan to do dialysis today, might need Blood transfusion during dialysis.   Hypertension: -continue home medications: Amlodipine, clonidine, hydralazine, Coreg -hold lisinopril prior surgery.   Hyperlipidemia: -Lipitor  GERD: -Protonix  CAD: Currently no chest pain. EKG has no new change. -Continue aspirin and Lipitor  Kidney mass:  incidental finding on his CT scan was a 2 cm enhancing mass in the midpole of the left kidney concerning for possible neoplasm or malignancy. MRI: Follow up CT without and with contrast in 1 year suggested to assess the stability of this lesion.  DVT prophylaxis: heparin.   Code Status: full Family Communication: patient/ and  family member at bedside.  Disposition Plan: continue to monitor step down.     Consultants:  Vascular  Nephrologist   Procedures: right below the knee amputation 4-14.   HPI/Subjective: He is feeling better. Denies dyspnea,. No chest pain.  Wants to eat regular diet. Passing some gas.    Objective: Filed Vitals:   01/07/15 0750  BP: 152/72  Pulse: 88  Temp: 98.6 F (37 C)  Resp: 19    Intake/Output Summary (Last 24 hours) at 01/07/15 0930 Last data filed at 01/07/15 0900  Gross per 24 hour  Intake   1180 ml  Output   4000 ml  Net  -2820 ml   Filed Weights   01/05/15 2143 01/06/15 1146 01/07/15 0500  Weight: 35.607 kg (78 lb 8 oz) 78.019 kg (172 lb) 78.2 kg (172 lb 6.4 oz)    Exam:   General:  Patient is in acute distress, using accessory muscle to breath.   Cardiovascular: rrr  Respiratory: Bilateral [place].   Abdomen: +BS, soft, nt  Musculoskeletal: dressing in place post Right AKA.   Data Reviewed: Basic Metabolic Panel:  Recent Labs Lab 12/31/14 1210  01/03/15 1115 01/04/15 1900 01/05/15 0610 01/06/15 0520 01/07/15 0405  NA  --   < > 129* 133* 138 134* 136  K  --   < > 3.6 3.0* 4.1 4.9 4.4  CL  --   < > 89* 94* 100 97 96  CO2  --   < > 24 27 29 23 26   GLUCOSE  --   < > 147* 135* 83 104* 107*  BUN  --   < > 61* 23 15 26* 21  CREATININE  --   < > 9.29* 4.73* 4.13* 6.04* 4.67*  CALCIUM  --   < > 8.7 8.2* 8.5 9.1 8.8  PHOS 6.7*  --  5.8*  --   --   --   --   < > = values in this interval not displayed. Liver Function Tests:  Recent Labs Lab 01/03/15 1115  ALBUMIN 2.1*   No results for input(s): LIPASE, AMYLASE in the last 168 hours. No results for input(s): AMMONIA in the last 168 hours. CBC:  Recent Labs Lab 01/03/15 1114 01/04/15 1900 01/05/15 0610 01/06/15 0520 01/07/15 0405  WBC 7.8 7.9 7.4 10.3 8.5  HGB 7.8* 8.2* 8.1* 8.5* 7.5*  HCT 25.3* 26.4* 26.6* 28.1* 24.5*  MCV 95.1 96.4 98.9 100.4* 98.8  PLT 291 303 285 331 261   Cardiac Enzymes: No results for input(s): CKTOTAL, CKMB, CKMBINDEX,  TROPONINI in the last 168 hours. BNP (last 3 results) No results for input(s): BNP in the last 8760 hours.  ProBNP (last 3 results)  Recent Labs  06/21/14 1046 08/17/14 0625 08/29/14 0928  PROBNP >70000.0* >70000.0* >70000.0*    CBG: No results for input(s): GLUCAP in the last 168 hours.  Recent Results (from the past 240 hour(s))  Blood culture (routine x 2)     Status: None   Collection Time: 12/30/14  5:54 PM  Result Value Ref Range Status   Specimen Description BLOOD RIGHT FOREARM  Final   Special Requests BOTTLES DRAWN AEROBIC AND ANAEROBIC 10CC  Final   Culture   Final    NO GROWTH 5 DAYS Performed at [facility]    Report Status 01/06/2015 FINAL  Final  Blood culture (routine x 2)     Status: None   Collection Time: 12/30/14  6:06 PM  Result Value Ref Range Status   Specimen Description BLOOD HAND RIGHT  Final   Special Requests BOTTLES DRAWN AEROBIC AND ANAEROBIC 5CC  Final   Culture   Final    NO GROWTH 5 DAYS Performed at [facility]    Report Status 01/06/2015 FINAL  Final  MRSA PCR Screening     Status: None   Collection Time: 12/31/14  1:11 AM  Result Value Ref Range Status   MRSA by PCR NEGATIVE NEGATIVE Final    Comment:        The GeneXpert MRSA Assay (FDA approved for NASAL specimens only), is one component of a comprehensive MRSA colonization surveillance program. It is not intended to diagnose MRSA infection nor to guide or monitor treatment for MRSA infections.   Surgical pcr screen     Status: None   Collection Time: 01/04/15 10:54 AM  Result Value Ref Range Status   MRSA, PCR NEGATIVE NEGATIVE Final   Staphylococcus aureus NEGATIVE NEGATIVE Final    Comment:        The Xpert SA Assay (FDA approved for NASAL specimens in patients over 60 years of age), is one component of a comprehensive surveillance program.  Test performance has been validated by [facility] for patients greater than or equal to 70 year  old. It is not intended to diagnose infection nor to guide or monitor treatment.      Studies: Dg Chest Port 1 View  01/06/2015   CLINICAL DATA:  Hypertension, dialysis  EXAM: PORTABLE CHEST - 1 VIEW  COMPARISON:  Portable exam 1130 hr compared to 10/12/2014  FINDINGS: Enlargement of cardiac silhouette with pulmonary vascular congestion.  Mild perihilar and basilar infiltrates question pulmonary edema.  LEFT pleural effusion.  No pneumothorax.  Atherosclerotic calcification aorta.  No acute osseous findings.  IMPRESSION: Enlargement of cardiac silhouette with  pulmonary vascular congestion and suspect mild pulmonary edema.  LEFT pleural effusion   Electronically Signed   By: Lavonia Dana M.D.   On: 01/06/2015 11:43    Scheduled Meds: . amLODipine  10 mg Oral Daily  . antiseptic oral rinse  7 mL Mouth Rinse BID  . aspirin  325 mg Oral Daily  . atorvastatin  40 mg Oral q1800  . calcitRIOL  0.5 mcg Oral Daily  . carvedilol  25 mg Oral BID WC  . cloNIDine  0.2 mg Oral BID  . darbepoetin (ARANESP) injection - DIALYSIS  100 mcg Intravenous Q Sat-HD  . docusate sodium  100 mg Oral Daily  . feeding supplement (RESOURCE BREEZE)  1 Container Oral TID BM  . heparin  5,000 Units Subcutaneous 3 times per day  . ipratropium-albuterol  3 mL Nebulization Q4H  . multivitamin  1 tablet Oral Daily  . pantoprazole  40 mg Oral Daily  . piperacillin-tazobactam (ZOSYN)  IV  2.25 g Intravenous Q8H  . polyethylene glycol  17 g Oral Daily  . rOPINIRole  0.25-0.5 mg Oral QHS  . sevelamer carbonate  4,000 mg Oral TID WC  . sodium chloride  3 mL Intravenous Q12H  . vancomycin  750 mg Intravenous Q T,Th,Sa-HD   Continuous Infusions: . sodium chloride 10 mL/hr at 01/06/15 1902   Antibiotics Given (last 72 hours)    Date/Time Action Medication Dose Rate   01/04/15 1350 Given   piperacillin-tazobactam (ZOSYN) IVPB 2.25 g 2.25 g 100 mL/hr   01/04/15 1614 Given   vancomycin (VANCOCIN) IVPB 750 mg/150 ml premix  750 mg 150 mL/hr   01/04/15 2304 Given   piperacillin-tazobactam (ZOSYN) IVPB 2.25 g 2.25 g 100 mL/hr   01/05/15 0626 Given   piperacillin-tazobactam (ZOSYN) IVPB 2.25 g 2.25 g 100 mL/hr   01/05/15 2203 Given   piperacillin-tazobactam (ZOSYN) IVPB 2.25 g 2.25 g 100 mL/hr   01/06/15 0529 Given   piperacillin-tazobactam (ZOSYN) IVPB 2.25 g 2.25 g 100 mL/hr   01/06/15 1902 Given   vancomycin (VANCOCIN) IVPB 750 mg/150 ml premix 750 mg 150 mL/hr   01/06/15 2123 Given   piperacillin-tazobactam (ZOSYN) IVPB 2.25 g 2.25 g 100 mL/hr   01/07/15 0557 Given   piperacillin-tazobactam (ZOSYN) IVPB 2.25 g 2.25 g 100 mL/hr      Principal Problem:   Foot pain, right Active Problems:   HLD (hyperlipidemia)   Polycystic kidney   Lower GI bleed   ESRD (end stage renal disease) on dialysis   Foot pain   CHF (congestive heart failure)   CAD (coronary artery disease)   Hyperkalemia   Renal mass    Time spent: 35 min    Kitara Hebb A  [facility] Pager [phone] If 7PM-7AM, please contact night-coverage at [email or web address], password [facility] 01/07/2015, 9:30 AM  LOS: 8 days

## 2015-01-07 NOTE — Progress Notes (Signed)
1. PVD- s/p right BKA 2. ESRD- normally TTS, off schedule due to surgery today, plan for HD again today 3. Anemia: cont with Aranesp and transfuse prn 4. 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 Nutrition:per primary 6. Hypertension:stable 7. Nodule in right kidney  Subjective: Interval History: 4000cc off with emergency dialysis yesterday, feels much better  Objective: Vital signs in last 24 hours: Temp:  [98 F (36.7 C)-99.2 F (37.3 C)] 98 F (36.7 C) (04/16 1142) Pulse Rate:  [61-88] 88 (04/16 1142) Resp:  [11-25] 25 (04/16 1142) BP: (113-152)/(50-82) 136/53 mmHg (04/16 1142) SpO2:  [97 %-100 %] 99 % (04/16 1148) Weight:  [78.2 kg (172 lb 6.4 oz)] 78.2 kg (172 lb 6.4 oz) (04/16 0500) Weight change: 42.411 kg (93 lb 8 oz)  Intake/Output from previous day: 04/15 0701 - 04/16 0700 In: 1390 [P.O.:880; I.V.:410; IV Piggyback:100] Out: 4000  Intake/Output this shift: Total I/O In: 235 [P.O.:235] Out: -   Lungs wheezing Cor RRR RKA  Lab Results:  Recent Labs  01/06/15 0520 01/07/15 0405  WBC 10.3 8.5  HGB 8.5* 7.5*  HCT 28.1* 24.5*  PLT 331 261   BMET:  Recent Labs  01/06/15 0520 01/07/15 0405  NA 134* 136  K 4.9 4.4  CL 97 96  CO2 23 26  GLUCOSE 104* 107*  BUN 26* 21  CREATININE 6.04* 4.67*  CALCIUM 9.1 8.8   No results for input(s): PTH in the last 72 hours. Iron Studies: No results for input(s): IRON, TIBC, TRANSFERRIN, FERRITIN in the last 72 hours. Studies/Results: Dg Chest Port 1 View  01/06/2015   CLINICAL DATA:  Hypertension, dialysis  EXAM: PORTABLE CHEST - 1 VIEW  COMPARISON:  Portable exam 1130 hr compared to 10/12/2014  FINDINGS: Enlargement of cardiac silhouette with pulmonary vascular congestion.  Mild perihilar and basilar infiltrates question pulmonary edema.  LEFT pleural effusion.  No pneumothorax.  Atherosclerotic calcification aorta.  No acute osseous findings.  IMPRESSION: Enlargement of cardiac silhouette with pulmonary  vascular congestion and suspect mild pulmonary edema.  LEFT pleural effusion   Electronically Signed   By: Lavonia Dana M.D.   On: 01/06/2015 11:43    LOS: 8 days   Alexandre Lightsey C 01/07/2015,2:13 PM

## 2015-01-07 NOTE — Progress Notes (Signed)
  Progress Note    01/07/2015 8:51 AM 2 Days Post-Op  Subjective:  Still with pain, but overall doing well  Tm 99.2 now 98.9 VSS 100% 2LO2NC   Filed Vitals:   01/07/15 0431  BP: 137/63  Pulse: 85  Temp: 98.9 F (37.2 C)  Resp: 15    Physical Exam: Incisions:  C/d/i with staples in tact Extremities:  Good ROM of RLE; able to lift leg and straighten knee  CBC    Component Value Date/Time   WBC 8.5 01/07/2015 0405   RBC 2.48* 01/07/2015 0405   RBC 3.21* 10/08/2012 2241   HGB 7.5* 01/07/2015 0405   HCT 24.5* 01/07/2015 0405   PLT 261 01/07/2015 0405   MCV 98.8 01/07/2015 0405   MCH 30.2 01/07/2015 0405   MCHC 30.6 01/07/2015 0405   RDW 18.6* 01/07/2015 0405   LYMPHSABS 0.7 12/30/2014 1754   MONOABS 0.9 12/30/2014 1754   EOSABS 0.0 12/30/2014 1754   BASOSABS 0.0 12/30/2014 1754    BMET    Component Value Date/Time   NA 136 01/07/2015 0405   K 4.4 01/07/2015 0405   CL 96 01/07/2015 0405   CO2 26 01/07/2015 0405   GLUCOSE 107* 01/07/2015 0405   BUN 21 01/07/2015 0405   CREATININE 4.67* 01/07/2015 0405   CALCIUM 8.8 01/07/2015 0405   CALCIUM 8.1* 06/24/2012 1634   GFRNONAA 11* 01/07/2015 0405   GFRAA 12* 01/07/2015 0405    INR    Component Value Date/Time   INR 1.03 01/05/2015 0610     Intake/Output Summary (Last 24 hours) at 01/07/15 0851 Last data filed at 01/07/15 0600  Gross per 24 hour  Intake    910 ml  Output   4000 ml  Net  -3090 ml     Assessment/Plan:  81 y.o. male is s/p right below knee amputation  2 Days Post-Op  -dressing changed and wound looks good -stump is viable -acute blood loss anemia-transfuse per primary team. -continue pain control -encouraged pt to continue working on straightening knee; able to lift leg and tolerated dressing change well.   Randy Locket, [name] [phone] 01/07/2015 8:51 AM     I agree with the above.  I have seen and examined the patient.  His dressing was  changed today.  His below knee incision is healing nicely.  Randy [name]

## 2015-01-08 ENCOUNTER — Inpatient Hospital Stay (HOSPITAL_COMMUNITY): Payer: Medicare Other

## 2015-01-08 DIAGNOSIS — I5023 Acute on chronic systolic (congestive) heart failure: Secondary | ICD-10-CM

## 2015-01-08 LAB — CBC
HCT: 23.4 % — ABNORMAL LOW (ref 39.0–52.0)
HEMOGLOBIN: 7.2 g/dL — AB (ref 13.0–17.0)
MCH: 30.3 pg (ref 26.0–34.0)
MCHC: 30.8 g/dL (ref 30.0–36.0)
MCV: 98.3 fL (ref 78.0–100.0)
Platelets: 265 10*3/uL (ref 150–400)
RBC: 2.38 MIL/uL — ABNORMAL LOW (ref 4.22–5.81)
RDW: 18.5 % — AB (ref 11.5–15.5)
WBC: 7.9 10*3/uL (ref 4.0–10.5)

## 2015-01-08 LAB — EXPECTORATED SPUTUM ASSESSMENT W GRAM STAIN, RFLX TO RESP C: Special Requests: NORMAL

## 2015-01-08 LAB — VANCOMYCIN, RANDOM: Vancomycin Rm: 19.6 ug/mL

## 2015-01-08 LAB — EXPECTORATED SPUTUM ASSESSMENT W REFEX TO RESP CULTURE

## 2015-01-08 LAB — BRAIN NATRIURETIC PEPTIDE

## 2015-01-08 MED ORDER — METHYLPREDNISOLONE SODIUM SUCC 40 MG IJ SOLR
40.0000 mg | Freq: Two times a day (BID) | INTRAMUSCULAR | Status: DC
  Administered 2015-01-08 – 2015-01-11 (×7): 40 mg via INTRAVENOUS
  Filled 2015-01-08 (×9): qty 1

## 2015-01-08 MED ORDER — ALBUTEROL SULFATE (2.5 MG/3ML) 0.083% IN NEBU
2.5000 mg | INHALATION_SOLUTION | Freq: Once | RESPIRATORY_TRACT | Status: AC
  Administered 2015-01-08: 2.5 mg via RESPIRATORY_TRACT
  Filled 2015-01-08: qty 3

## 2015-01-08 NOTE — Progress Notes (Signed)
This note also relates to the following rows which could not be included: Pulse Rate - Cannot attach notes to unvalidated device data Resp - Cannot attach notes to unvalidated device data SpO2 - Cannot attach notes to unvalidated device data   Patient is tolerating [place] well at this time. BIPAP not needed. RT will continue to assist as needed.

## 2015-01-08 NOTE — Progress Notes (Signed)
1. PVD- s/p right BKA 2. ESRD- mild vol xs, normally TTS, off schedule, plan for HD again Monday to optimize volume 3. Anemia: cont with Aranesp and transfuse prn 4. 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 Nutrition:per primary 6. Hypertension:stable 7. Nodule in right kidney   Subjective: Interval History: SOB better after neb treatment  Objective: Vital signs in last 24 hours: Temp:  [98 F (36.7 C)-99 F (37.2 C)] 99 F (37.2 C) (04/17 0745) Pulse Rate:  [77-99] 92 (04/17 0745) Resp:  [14-28] 28 (04/17 0745) BP: (112-145)/(53-78) 137/65 mmHg (04/17 0745) SpO2:  [93 %-99 %] 97 % (04/17 0824) Weight:  [71.5 kg (157 lb 10.1 oz)-73.4 kg (161 lb 13.1 oz)] 71.5 kg (157 lb 10.1 oz) (04/16 1718) Weight change: -4.619 kg (-10 lb 2.9 oz)  Intake/Output from previous day: 04/16 0701 - 04/17 0700 In: 648 [P.O.:595; I.V.:3; IV Piggyback:50] Out: 1779  Intake/Output this shift: Total I/O In: 240 [P.O.:240] Out: -   General appearance: alert and cooperative Resp: wheezes bilaterally Cardio: regular rate and rhythm, S1, S2 normal, no murmur, click, rub or gallop Extremities: right BKA, no LLE  Lab Results:  Recent Labs  01/07/15 0405 01/08/15 0309  WBC 8.5 7.9  HGB 7.5* 7.2*  HCT 24.5* 23.4*  PLT 261 265   BMET:  Recent Labs  01/06/15 0520 01/07/15 0405  NA 134* 136  K 4.9 4.4  CL 97 96  CO2 23 26  GLUCOSE 104* 107*  BUN 26* 21  CREATININE 6.04* 4.67*  CALCIUM 9.1 8.8   No results for input(s): PTH in the last 72 hours. Iron Studies: No results for input(s): IRON, TIBC, TRANSFERRIN, FERRITIN in the last 72 hours. Studies/Results: Dg Chest Port 1 View  01/08/2015   CLINICAL DATA:  Dyspnea.  Pneumonia.  EXAM: PORTABLE CHEST - 1 VIEW  COMPARISON:  01/06/2015  FINDINGS: The cardiac silhouette, mediastinal and hilar contours are stable. There is a persistent left lower lobe process likely a combination of infiltrate or atelectasis and effusion. Overall, the  lungs are better aerated with resolution of interstitial pulmonary edema.  IMPRESSION: Improved aeration with resolution of interstitial edema.  Persistent left lower lobe process.   Electronically Signed   By: Marijo Sanes M.D.   On: 01/08/2015 08:45   Dg Chest Port 1 View  01/06/2015   CLINICAL DATA:  Hypertension, dialysis  EXAM: PORTABLE CHEST - 1 VIEW  COMPARISON:  Portable exam 1130 hr compared to 10/12/2014  FINDINGS: Enlargement of cardiac silhouette with pulmonary vascular congestion.  Mild perihilar and basilar infiltrates question pulmonary edema.  LEFT pleural effusion.  No pneumothorax.  Atherosclerotic calcification aorta.  No acute osseous findings.  IMPRESSION: Enlargement of cardiac silhouette with pulmonary vascular congestion and suspect mild pulmonary edema.  LEFT pleural effusion   Electronically Signed   By: Lavonia Dana M.D.   On: 01/06/2015 11:43   Scheduled: . antiseptic oral rinse  7 mL Mouth Rinse BID  . aspirin  325 mg Oral Daily  . atorvastatin  40 mg Oral q1800  . calcitRIOL  0.5 mcg Oral Daily  . carvedilol  25 mg Oral BID WC  . cloNIDine  0.2 mg Oral BID  . darbepoetin (ARANESP) injection - DIALYSIS  100 mcg Intravenous Q Sat-HD  . docusate sodium  100 mg Oral Daily  . feeding supplement (RESOURCE BREEZE)  1 Container Oral TID BM  . heparin  5,000 Units Subcutaneous 3 times per day  . ipratropium-albuterol  3 mL  Nebulization Q4H  . methylPREDNISolone (SOLU-MEDROL) injection  40 mg Intravenous Q12H  . multivitamin  1 tablet Oral Daily  . pantoprazole  40 mg Oral Daily  . piperacillin-tazobactam (ZOSYN)  IV  2.25 g Intravenous Q8H  . polyethylene glycol  17 g Oral Daily  . rOPINIRole  0.25-0.5 mg Oral QHS  . sevelamer carbonate  4,000 mg Oral TID WC  . sodium chloride  3 mL Intravenous Q12H  . [START ON 01/10/2015] vancomycin  750 mg Intravenous Q T,Th,Sa-HD     LOS: 9 days   Marleen Moret C 01/08/2015,9:45 AM

## 2015-01-08 NOTE — Progress Notes (Signed)
PROGRESS NOTE  Randy [name] XIP:[id number] DOB: 09-22-1932 DOA: 12/30/2014 PCP: [name], MD  Assessment/Plan: Acute  Respiratory failure: secondary to pulmonary edema. Systolic HF exacerbation. Patient with increase work of breathing this morning. Diffuse wheezing and crackles lung exam on 4-15. Patient respiratory status improved with dialysis and nebulizer on 4-15. BIPAP PRN, nebulizer treatments.  -Patient with increase SOB today, tachypnea, ronchus and wheezing, productive cough.  -Continue with nebulizer treatments.  -will give albuterol now, continue with nebulizer, will keep IV antibiotics for now. Check chest x ray rule out PNA, pulmonary edema. Will also start Solumedrol in case f bronchospasm. BIPAP PRN.   Right foot pain: S/P AKA.  -treated outpatient with Keflex due to suspected cellulitis,without improvement.  - had arterial Seg signal doppler study on 12/27/14, which showed evidence of significant peripheral small vessel tibial disease. -IV abx, continue with Vancomycin.  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 -Discussed with Dr.  Scot Dock, continue with IV antibiotics for 48 hours post surgery.   Acute blood loss anemia; defer to renal blood transfusion, during dialysis.   ESRD-HD with hyperkalemia at admission: renal for HD Had Emergent dialysis 4-15. Had dialysis 4-16. Will check Chest x ray, stat, will inform to renal about respiratory status.   Hypertension: -Continue home medications: Amlodipine, clonidine, hydralazine, Coreg -hold lisinopril prior surgery.   Hyperlipidemia: -Lipitor  GERD: -Protonix  CAD: Currently no chest pain. EKG has no new change. -Continue aspirin and Lipitor  Kidney mass:  incidental finding on his CT scan was a 2 cm enhancing mass in the midpole of the left kidney concerning for possible neoplasm or malignancy. MRI: Follow up CT without and with contrast in 1 year suggested to assess the stability of this lesion.  DVT  prophylaxis: heparin.   Code Status: full Family Communication: patient/ Disposition Plan: continue to monitor step down.    Consultants:  Vascular  Nephrologist   Procedures: Right below the knee amputation 4-14.   HPI/Subjective: He is feeling SOB this morning.  He has been coughing. He has been having wheezing.    Objective: Filed Vitals:   01/08/15 0745  BP: 137/65  Pulse: 92  Temp: 99 F (37.2 C)  Resp: 28    Intake/Output Summary (Last 24 hours) at 01/08/15 0821 Last data filed at 01/07/15 2211  Gross per 24 hour  Intake    648 ml  Output   1779 ml  Net  -1131 ml   Filed Weights   01/07/15 0500 01/07/15 1445 01/07/15 1718  Weight: 78.2 kg (172 lb 6.4 oz) 73.4 kg (161 lb 13.1 oz) 71.5 kg (157 lb 10.1 oz)    Exam:   General: Speaking full sentences. Alert.   Cardiovascular: rrr  Respiratory: Bilateral Ronchus, wheezing.   Abdomen: +BS, soft, nt  Musculoskeletal: dressing in place post Right AKA.   Data Reviewed: Basic Metabolic Panel:  Recent Labs Lab 01/03/15 1115 01/04/15 1900 01/05/15 0610 01/06/15 0520 01/07/15 0405  NA 129* 133* 138 134* 136  K 3.6 3.0* 4.1 4.9 4.4  CL 89* 94* 100 97 96  CO2 24 27 29 23 26   GLUCOSE 147* 135* 83 104* 107*  BUN 61* 23 15 26* 21  CREATININE 9.29* 4.73* 4.13* 6.04* 4.67*  CALCIUM 8.7 8.2* 8.5 9.1 8.8  PHOS 5.8*  --   --   --   --    Liver Function Tests:  Recent Labs Lab 01/03/15 1115  ALBUMIN 2.1*   No results for input(s): LIPASE, AMYLASE  in the last 168 hours. No results for input(s): AMMONIA in the last 168 hours. CBC:  Recent Labs Lab 01/04/15 1900 01/05/15 0610 01/06/15 0520 01/07/15 0405 01/08/15 0309  WBC 7.9 7.4 10.3 8.5 7.9  HGB 8.2* 8.1* 8.5* 7.5* 7.2*  HCT 26.4* 26.6* 28.1* 24.5* 23.4*  MCV 96.4 98.9 100.4* 98.8 98.3  PLT 303 285 331 261 265   Cardiac Enzymes: No results for input(s): CKTOTAL, CKMB, CKMBINDEX, TROPONINI in the last 168 hours. BNP (last 3  results) No results for input(s): BNP in the last 8760 hours.  ProBNP (last 3 results)  Recent Labs  06/21/14 1046 08/17/14 0625 08/29/14 0928  PROBNP >70000.0* >70000.0* >70000.0*    CBG: No results for input(s): GLUCAP in the last 168 hours.  Recent Results (from the past 240 hour(s))  Blood culture (routine x 2)     Status: None   Collection Time: 12/30/14  5:54 PM  Result Value Ref Range Status   Specimen Description BLOOD RIGHT FOREARM  Final   Special Requests BOTTLES DRAWN AEROBIC AND ANAEROBIC 10CC  Final   Culture   Final    NO GROWTH 5 DAYS Performed at [facility]    Report Status 01/06/2015 FINAL  Final  Blood culture (routine x 2)     Status: None   Collection Time: 12/30/14  6:06 PM  Result Value Ref Range Status   Specimen Description BLOOD HAND RIGHT  Final   Special Requests BOTTLES DRAWN AEROBIC AND ANAEROBIC 5CC  Final   Culture   Final    NO GROWTH 5 DAYS Performed at [facility]    Report Status 01/06/2015 FINAL  Final  MRSA PCR Screening     Status: None   Collection Time: 12/31/14  1:11 AM  Result Value Ref Range Status   MRSA by PCR NEGATIVE NEGATIVE Final    Comment:        The GeneXpert MRSA Assay (FDA approved for NASAL specimens only), is one component of a comprehensive MRSA colonization surveillance program. It is not intended to diagnose MRSA infection nor to guide or monitor treatment for MRSA infections.   Surgical pcr screen     Status: None   Collection Time: 01/04/15 10:54 AM  Result Value Ref Range Status   MRSA, PCR NEGATIVE NEGATIVE Final   Staphylococcus aureus NEGATIVE NEGATIVE Final    Comment:        The Xpert SA Assay (FDA approved for NASAL specimens in patients over 56 years of age), is one component of a comprehensive surveillance program.  Test performance has been validated by [facility] for patients greater than or equal to 65 year old. It is not intended to diagnose infection nor  to guide or monitor treatment.      Studies: Dg Chest Port 1 View  01/06/2015   CLINICAL DATA:  Hypertension, dialysis  EXAM: PORTABLE CHEST - 1 VIEW  COMPARISON:  Portable exam 1130 hr compared to 10/12/2014  FINDINGS: Enlargement of cardiac silhouette with pulmonary vascular congestion.  Mild perihilar and basilar infiltrates question pulmonary edema.  LEFT pleural effusion.  No pneumothorax.  Atherosclerotic calcification aorta.  No acute osseous findings.  IMPRESSION: Enlargement of cardiac silhouette with pulmonary vascular congestion and suspect mild pulmonary edema.  LEFT pleural effusion   Electronically Signed   By: Lavonia Dana M.D.   On: 01/06/2015 11:43    Scheduled Meds: . albuterol  2.5 mg Nebulization Once  . amLODipine  10 mg Oral Daily  .  antiseptic oral rinse  7 mL Mouth Rinse BID  . aspirin  325 mg Oral Daily  . atorvastatin  40 mg Oral q1800  . calcitRIOL  0.5 mcg Oral Daily  . carvedilol  25 mg Oral BID WC  . cloNIDine  0.2 mg Oral BID  . darbepoetin (ARANESP) injection - DIALYSIS  100 mcg Intravenous Q Sat-HD  . docusate sodium  100 mg Oral Daily  . feeding supplement (RESOURCE BREEZE)  1 Container Oral TID BM  . heparin  5,000 Units Subcutaneous 3 times per day  . ipratropium-albuterol  3 mL Nebulization Q4H  . methylPREDNISolone (SOLU-MEDROL) injection  40 mg Intravenous Q12H  . multivitamin  1 tablet Oral Daily  . pantoprazole  40 mg Oral Daily  . piperacillin-tazobactam (ZOSYN)  IV  2.25 g Intravenous Q8H  . polyethylene glycol  17 g Oral Daily  . rOPINIRole  0.25-0.5 mg Oral QHS  . sevelamer carbonate  4,000 mg Oral TID WC  . sodium chloride  3 mL Intravenous Q12H  . [START ON 01/10/2015] vancomycin  750 mg Intravenous Q T,Th,Sa-HD   Continuous Infusions:   Antibiotics Given (last 72 hours)    Date/Time Action Medication Dose Rate   01/05/15 2203 Given   piperacillin-tazobactam (ZOSYN) IVPB 2.25 g 2.25 g 100 mL/hr   01/06/15 0529 Given    piperacillin-tazobactam (ZOSYN) IVPB 2.25 g 2.25 g 100 mL/hr   01/06/15 1902 Given   vancomycin (VANCOCIN) IVPB 750 mg/150 ml premix 750 mg 150 mL/hr   01/06/15 2123 Given   piperacillin-tazobactam (ZOSYN) IVPB 2.25 g 2.25 g 100 mL/hr   01/07/15 0557 Given   piperacillin-tazobactam (ZOSYN) IVPB 2.25 g 2.25 g 100 mL/hr   01/07/15 1407 Given   piperacillin-tazobactam (ZOSYN) IVPB 2.25 g 2.25 g 100 mL/hr   01/07/15 2210 Given   piperacillin-tazobactam (ZOSYN) IVPB 2.25 g 2.25 g 100 mL/hr   01/08/15 0532 Given   piperacillin-tazobactam (ZOSYN) IVPB 2.25 g 2.25 g 100 mL/hr      Principal Problem:   Foot pain, right Active Problems:   HLD (hyperlipidemia)   Polycystic kidney   Lower GI bleed   ESRD (end stage renal disease) on dialysis   Foot pain   CHF (congestive heart failure)   CAD (coronary artery disease)   Hyperkalemia   Renal mass    Time spent: 35 min    Akiva Josey A  [facility] Pager [phone] If 7PM-7AM, please contact night-coverage at [email or web address], password [facility] 01/08/2015, 8:21 AM  LOS: 9 days

## 2015-01-08 NOTE — Progress Notes (Signed)
ANTIBIOTIC CONSULT NOTE - FOLLOW UP  Pharmacy Consult for Vanc/Zosyn Indication: cellulitis - failed outpatient abx  No Known Allergies  Labs:  Recent Labs  01/06/15 0520 01/07/15 0405 01/08/15 0309  WBC 10.3 8.5 7.9  HGB 8.5* 7.5* 7.2*  PLT 331 261 265  CREATININE 6.04* 4.67*  --    Estimated Creatinine Clearance: 12.4 mL/min (by C-G formula based on Cr of 4.67).  Recent Labs  01/06/15 1341 01/08/15 1603  VANCORANDOM 11.3 19.6     Assessment: 79 YOM with hx of ESRD- usually on PD (peritoneal dialysis but has received hemodialysis x 3 this week) presented to the ED with right foot pain and redness, failed outpatient treatment with PO keflex.   Pharmacy is consulted to start vancomycin and zosyn. Abx D#8, WBC wnl, afebrile. Plan for R-BKA on 4/14. Has been off schedule and vancomycin administrations have been given incorrectly (ie got doses before HD instead of after, multiple doses on the same day- SZP was completed).  Random vancomycin level this evening 19.8 which is slightly above post-HD vancomycin level of 5-84mcg/mL.  Goal of Therapy:  Pre-HD Vanc Level 15-42mcg/mL  Plan:  -no vancomycin needs to be given this evening -vancomycin 750mg  IV qTTSat with HD has been ordered- will keep that order active. Patient likely to go to HD tomorrow (Monday, off schedule). Pharmacy to follow for HD scheduled and tolerance and enter doses as needed  Brennen Camper D. Ismail Graziani, PharmD, BCPS Clinical Pharmacist Pager: [phone] 01/08/2015 5:58 PM

## 2015-01-09 ENCOUNTER — Encounter (HOSPITAL_COMMUNITY): Payer: Self-pay | Admitting: Vascular Surgery

## 2015-01-09 LAB — PREPARE RBC (CROSSMATCH)

## 2015-01-09 LAB — BASIC METABOLIC PANEL
ANION GAP: 14 (ref 5–15)
BUN: 48 mg/dL — ABNORMAL HIGH (ref 6–23)
CALCIUM: 8.6 mg/dL (ref 8.4–10.5)
CO2: 26 mmol/L (ref 19–32)
Chloride: 92 mmol/L — ABNORMAL LOW (ref 96–112)
Creatinine, Ser: 7.72 mg/dL — ABNORMAL HIGH (ref 0.50–1.35)
GFR calc non Af Amer: 6 mL/min — ABNORMAL LOW (ref >90)
GFR, EST AFRICAN AMERICAN: 7 mL/min — AB (ref >90)
Glucose, Bld: 162 mg/dL — ABNORMAL HIGH (ref 70–99)
Potassium: 4.8 mmol/L (ref 3.5–5.1)
Sodium: 132 mmol/L — ABNORMAL LOW (ref 135–145)

## 2015-01-09 LAB — CBC
HCT: 23 % — ABNORMAL LOW (ref 39.0–52.0)
HEMOGLOBIN: 7 g/dL — AB (ref 13.0–17.0)
MCH: 30 pg (ref 26.0–34.0)
MCHC: 30.4 g/dL (ref 30.0–36.0)
MCV: 98.7 fL (ref 78.0–100.0)
Platelets: 251 10*3/uL (ref 150–400)
RBC: 2.33 MIL/uL — ABNORMAL LOW (ref 4.22–5.81)
RDW: 18.3 % — ABNORMAL HIGH (ref 11.5–15.5)
WBC: 10.7 10*3/uL — ABNORMAL HIGH (ref 4.0–10.5)

## 2015-01-09 MED ORDER — PENTAFLUOROPROP-TETRAFLUOROETH EX AERO: 1.0000 "application " | INHALATION_SPRAY | CUTANEOUS | Status: DC | PRN

## 2015-01-09 MED ORDER — NEPRO/CARBSTEADY PO LIQD: 237.0000 mL | ORAL | Status: DC | PRN

## 2015-01-09 MED ORDER — SODIUM CHLORIDE 0.9 % IV SOLN: 100.0000 mL | INTRAVENOUS | Status: DC | PRN

## 2015-01-09 MED ORDER — OXYCODONE-ACETAMINOPHEN 5-325 MG PO TABS
ORAL_TABLET | ORAL | Status: AC
  Filled 2015-01-09: qty 1

## 2015-01-09 MED ORDER — HEPARIN SODIUM (PORCINE) 1000 UNIT/ML DIALYSIS: 20.0000 [IU]/kg | INTRAMUSCULAR | Status: DC | PRN

## 2015-01-09 MED ORDER — ALTEPLASE 2 MG IJ SOLR: 2.0000 mg | Freq: Once | INTRAMUSCULAR | Status: DC | PRN

## 2015-01-09 MED ORDER — LIDOCAINE HCL (PF) 1 % IJ SOLN: 5.0000 mL | INTRAMUSCULAR | Status: DC | PRN

## 2015-01-09 MED ORDER — HEPARIN SODIUM (PORCINE) 1000 UNIT/ML DIALYSIS: 1000.0000 [IU] | INTRAMUSCULAR | Status: DC | PRN

## 2015-01-09 MED ORDER — LIDOCAINE-PRILOCAINE 2.5-2.5 % EX CREA: 1.0000 "application " | TOPICAL_CREAM | CUTANEOUS | Status: DC | PRN

## 2015-01-09 MED ORDER — LIDOCAINE-PRILOCAINE 2.5-2.5 % EX CREA
1.0000 "application " | TOPICAL_CREAM | CUTANEOUS | Status: DC | PRN
  Filled 2015-01-09: qty 5

## 2015-01-09 MED ORDER — SODIUM CHLORIDE 0.9 % IV SOLN: Freq: Once | INTRAVENOUS | Status: DC

## 2015-01-09 NOTE — Progress Notes (Addendum)
PT Cancellation Note  Patient Details Name: Randy [name] MRN: [id number] DOB: April 29, 1932   Cancelled Treatment:    Reason Eval/Treat Not Completed: Patient at procedure or test/unavailable (pt currently in HD and unavailable)   Attempted again at 1353 but pt eating and refused mobility at all today secondary to fatigue after HD. Will attempt at later date.   Lanetta Inch Beth 01/09/2015, 7:02 AM Elwyn Reach, [place]

## 2015-01-09 NOTE — Progress Notes (Signed)
OT Cancellation Note  Patient Details Name: UZAIR GODLEY MRN: [id number] DOB: [birth year]   Cancelled Treatment:    Reason Eval/Treat Not Completed: Patient at procedure or test/ unavailable (HD) Will continue to follow.  [name] 01/09/2015, 8:15 AM

## 2015-01-09 NOTE — Progress Notes (Addendum)
ANTIBIOTIC CONSULT NOTE - FOLLOW UP  Pharmacy Consult for Vanc/Zosyn Indication: r/o PNA  No Known Allergies  Labs:  Recent Labs  01/07/15 0405 01/08/15 0309 01/09/15 0736  WBC 8.5 7.9 10.7*  HGB 7.5* 7.2* 7.0*  PLT 261 265 251  CREATININE 4.67*  --  7.72*   Estimated Creatinine Clearance: 7.5 mL/min (by C-G formula based on Cr of 7.72).  Recent Labs  01/08/15 1603  VANCORANDOM 19.6     Assessment: 83 YOM continues on Vancomycin and Zosyn Day #9. Originally abx were for cellulitis but now with infiltrates on CXR 4/17 so covering for PNA. WBC wnl. Afebrile. Pt received extra HD session today (usually T/T/S schedule). BFR 350 x 4 hours. HD RN gave the vancomycin post-HD today.  Goal of Therapy:  Pre-HD Vanc Level 15-31mcg/mL  Plan:  - Continue vancomycin 750mg  IV qTTSat with HD - Continue Zosyn 2.25gm IV q8h - Will f/u HD schedule and tolerance closely  Sherlon Handing, PharmD, BCPS Clinical pharmacist, pager [phone] 01/09/2015 1:49 PM

## 2015-01-09 NOTE — Progress Notes (Signed)
Rehab admissions - Evaluated for possible admission.  I met with patient and I spoke with his wife.  They are interested in inpatient rehab admission.  Patient refusing therapy today.  I will begin authorization process.  Patient will need to participate and be able to tolerate therapies to be a rehab candidate.  Currently rehab beds are full.  Call me for questions.  #[phone]

## 2015-01-09 NOTE — Progress Notes (Signed)
PROGRESS NOTE  Randy [name] MEQ:[id number] DOB: 11-14-1931 DOA: 12/30/2014 PCP: [name], MD  Assessment/Plan: Acute  Respiratory failure: secondary to pulmonary edema. Systolic HF exacerbation. Patient with increase work of breathing this morning. Diffuse wheezing and crackles lung exam on 4-15. Patient respiratory status improved with dialysis and nebulizer on 4-15. BIPAP PRN, nebulizer treatments.  -Continue with nebulizer treatments.  -he is breathing better today. Chest x ray 4-17 showed infiltrates.  -continue with solumedrol, IV antibiotics due to chest x ray finding, and nebulizer.   Right foot pain: S/P AKA.  -treated outpatient with Keflex due to suspected cellulitis,without improvement.  - had arterial Seg signal doppler study on 12/27/14, which showed evidence of significant peripheral small vessel tibial disease. -IV abx, continue with Vancomycin.  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 -Discussed with Dr.  Scot Dock, continue with IV antibiotics for 48 hours post surgery.   Acute blood loss anemia; S/P 2 units PRBC dialysis 4-18.   ESRD-HD with hyperkalemia at admission: renal for HD Had Emergent dialysis 4-15. Had dialysis 4-16, 4-18   Hypertension: -Continue home medications: Amlodipine, clonidine, hydralazine, Coreg -hold lisinopril prior surgery.   Hyperlipidemia: -Lipitor  GERD: -Protonix  CAD: Currently no chest pain. EKG has no new change. -Continue aspirin and Lipitor  Kidney mass:  incidental finding on his CT scan was a 2 cm enhancing mass in the midpole of the left kidney concerning for possible neoplasm or malignancy. MRI: Follow up CT without and with contrast in 1 year suggested to assess the stability of this lesion.  DVT prophylaxis: heparin.   Code Status: full Family Communication: patient/ Disposition Plan: continue to monitor step down.    Consultants:  Vascular  Nephrologist   Procedures: Right below the knee amputation  4-14.   HPI/Subjective: He is breathing better today, nebulizer treatments are helping.    Objective: Filed Vitals:   01/09/15 1158  BP: 134/61  Pulse: 87  Temp:   Resp:     Intake/Output Summary (Last 24 hours) at 01/09/15 1353 Last data filed at 01/09/15 1115  Gross per 24 hour  Intake    913 ml  Output   3050 ml  Net  -2137 ml   Filed Weights   01/07/15 1718 01/09/15 0700 01/09/15 1115  Weight: 71.5 kg (157 lb 10.1 oz) 73.2 kg (161 lb 6 oz) 71.6 kg (157 lb 13.6 oz)    Exam:   General: Speaking full sentences. Alert.   Cardiovascular: rrr  Respiratory: Bilateral Ronchus, no wheezing.   Abdomen: +BS, soft, nt  Musculoskeletal: dressing in place post Right AKA.   Data Reviewed: Basic Metabolic Panel:  Recent Labs Lab 01/03/15 1115 01/04/15 1900 01/05/15 0610 01/06/15 0520 01/07/15 0405 01/09/15 0736  NA 129* 133* 138 134* 136 132*  K 3.6 3.0* 4.1 4.9 4.4 4.8  CL 89* 94* 100 97 96 92*  CO2 24 27 29 23 26 26   GLUCOSE 147* 135* 83 104* 107* 162*  BUN 61* 23 15 26* 21 48*  CREATININE 9.29* 4.73* 4.13* 6.04* 4.67* 7.72*  CALCIUM 8.7 8.2* 8.5 9.1 8.8 8.6  PHOS 5.8*  --   --   --   --   --    Liver Function Tests:  Recent Labs Lab 01/03/15 1115  ALBUMIN 2.1*   No results for input(s): LIPASE, AMYLASE in the last 168 hours. No results for input(s): AMMONIA in the last 168 hours. CBC:  Recent Labs Lab 01/05/15 0610 01/06/15 0520 01/07/15 0405 01/08/15 0309  01/09/15 0736  WBC 7.4 10.3 8.5 7.9 10.7*  HGB 8.1* 8.5* 7.5* 7.2* 7.0*  HCT 26.6* 28.1* 24.5* 23.4* 23.0*  MCV 98.9 100.4* 98.8 98.3 98.7  PLT 285 331 261 265 251   Cardiac Enzymes: No results for input(s): CKTOTAL, CKMB, CKMBINDEX, TROPONINI in the last 168 hours. BNP (last 3 results)  Recent Labs  01/08/15 1118  BNP >4500.0*    ProBNP (last 3 results)  Recent Labs  06/21/14 1046 08/17/14 0625 08/29/14 0928  PROBNP >70000.0* >70000.0* >70000.0*    CBG: No results  for input(s): GLUCAP in the last 168 hours.  Recent Results (from the past 240 hour(s))  Blood culture (routine x 2)     Status: None   Collection Time: 12/30/14  5:54 PM  Result Value Ref Range Status   Specimen Description BLOOD RIGHT FOREARM  Final   Special Requests BOTTLES DRAWN AEROBIC AND ANAEROBIC 10CC  Final   Culture   Final    NO GROWTH 5 DAYS Performed at [facility]    Report Status 01/06/2015 FINAL  Final  Blood culture (routine x 2)     Status: None   Collection Time: 12/30/14  6:06 PM  Result Value Ref Range Status   Specimen Description BLOOD HAND RIGHT  Final   Special Requests BOTTLES DRAWN AEROBIC AND ANAEROBIC 5CC  Final   Culture   Final    NO GROWTH 5 DAYS Performed at [facility]    Report Status 01/06/2015 FINAL  Final  MRSA PCR Screening     Status: None   Collection Time: 12/31/14  1:11 AM  Result Value Ref Range Status   MRSA by PCR NEGATIVE NEGATIVE Final    Comment:        The GeneXpert MRSA Assay (FDA approved for NASAL specimens only), is one component of a comprehensive MRSA colonization surveillance program. It is not intended to diagnose MRSA infection nor to guide or monitor treatment for MRSA infections.   Surgical pcr screen     Status: None   Collection Time: 01/04/15 10:54 AM  Result Value Ref Range Status   MRSA, PCR NEGATIVE NEGATIVE Final   Staphylococcus aureus NEGATIVE NEGATIVE Final    Comment:        The Xpert SA Assay (FDA approved for NASAL specimens in patients over 11 years of age), is one component of a comprehensive surveillance program.  Test performance has been validated by [facility] for patients greater than or equal to 51 year old. It is not intended to diagnose infection nor to guide or monitor treatment.   Culture, expectorated sputum-assessment     Status: None   Collection Time: 01/08/15 12:56 PM  Result Value Ref Range Status   Specimen Description SPUTUM  Final   Special  Requests Normal  Final   Sputum evaluation   Final    MICROSCOPIC FINDINGS SUGGEST THAT THIS SPECIMEN IS NOT REPRESENTATIVE OF LOWER RESPIRATORY SECRETIONS. PLEASE RECOLLECT. CALLED RN [facility] AT [phone] MARTINB   Report Status 01/08/2015 FINAL  Final     Studies: Dg Chest Port 1 View  01/08/2015   CLINICAL DATA:  Dyspnea.  Pneumonia.  EXAM: PORTABLE CHEST - 1 VIEW  COMPARISON:  01/06/2015  FINDINGS: The cardiac silhouette, mediastinal and hilar contours are stable. There is a persistent left lower lobe process likely a combination of infiltrate or atelectasis and effusion. Overall, the lungs are better aerated with resolution of interstitial pulmonary edema.  IMPRESSION: Improved aeration with  resolution of interstitial edema.  Persistent left lower lobe process.   Electronically Signed   By: Marijo Sanes M.D.   On: 01/08/2015 08:45    Scheduled Meds: . sodium chloride   Intravenous Once  . antiseptic oral rinse  7 mL Mouth Rinse BID  . aspirin  325 mg Oral Daily  . atorvastatin  40 mg Oral q1800  . calcitRIOL  0.5 mcg Oral Daily  . carvedilol  25 mg Oral BID WC  . cloNIDine  0.2 mg Oral BID  . darbepoetin (ARANESP) injection - DIALYSIS  100 mcg Intravenous Q Sat-HD  . docusate sodium  100 mg Oral Daily  . feeding supplement (RESOURCE BREEZE)  1 Container Oral TID BM  . heparin  5,000 Units Subcutaneous 3 times per day  . ipratropium-albuterol  3 mL Nebulization Q4H  . methylPREDNISolone (SOLU-MEDROL) injection  40 mg Intravenous Q12H  . multivitamin  1 tablet Oral Daily  . oxyCODONE-acetaminophen      . pantoprazole  40 mg Oral Daily  . piperacillin-tazobactam (ZOSYN)  IV  2.25 g Intravenous Q8H  . polyethylene glycol  17 g Oral Daily  . rOPINIRole  0.25-0.5 mg Oral QHS  . sevelamer carbonate  4,000 mg Oral TID WC  . sodium chloride  3 mL Intravenous Q12H  . [START ON 01/10/2015] vancomycin  750 mg Intravenous Q T,Th,Sa-HD   Continuous Infusions:   Antibiotics Given  (last 72 hours)    Date/Time Action Medication Dose Rate   01/06/15 1902 Given   vancomycin (VANCOCIN) IVPB 750 mg/150 ml premix 750 mg 150 mL/hr   01/06/15 2123 Given   piperacillin-tazobactam (ZOSYN) IVPB 2.25 g 2.25 g 100 mL/hr   01/07/15 0557 Given   piperacillin-tazobactam (ZOSYN) IVPB 2.25 g 2.25 g 100 mL/hr   01/07/15 1407 Given   piperacillin-tazobactam (ZOSYN) IVPB 2.25 g 2.25 g 100 mL/hr   01/07/15 2210 Given   piperacillin-tazobactam (ZOSYN) IVPB 2.25 g 2.25 g 100 mL/hr   01/08/15 0532 Given   piperacillin-tazobactam (ZOSYN) IVPB 2.25 g 2.25 g 100 mL/hr   01/08/15 1357 Given   piperacillin-tazobactam (ZOSYN) IVPB 2.25 g 2.25 g 100 mL/hr   01/08/15 2216 Given   piperacillin-tazobactam (ZOSYN) IVPB 2.25 g 2.25 g 100 mL/hr   01/09/15 1050 Given   vancomycin (VANCOCIN) IVPB 750 mg/150 ml premix 750 mg 150 mL/hr      Principal Problem:   Foot pain, right Active Problems:   HLD (hyperlipidemia)   Polycystic kidney   Lower GI bleed   ESRD (end stage renal disease) on dialysis   Foot pain   CHF (congestive heart failure)   CAD (coronary artery disease)   Hyperkalemia   Renal mass    Time spent: 35 min    Zira Helinski A  [facility] Pager [phone] If 7PM-7AM, please contact night-coverage at [email or web address], password [facility] 01/09/2015, 1:53 PM  LOS: 10 days

## 2015-01-09 NOTE — Progress Notes (Signed)
   VASCULAR SURGERY ASSESSMENT & PLAN:  * 4 Days Post-Op s/p: Right BKA  *  Steady progress  SUBJECTIVE: Pain well controlled.   PHYSICAL EXAM: Filed Vitals:   01/09/15 0200 01/09/15 0308 01/09/15 0400 01/09/15 0600  BP: 132/62  130/75 119/48  Pulse: 73  78 79  Temp:   97.7 F (36.5 C)   TempSrc:   Axillary   Resp: 15  11 22   Height:      Weight:      SpO2: 100% 100% 92% 92%   Dressing on right BKA dry.  LABS: Lab Results  Component Value Date   WBC 7.9 01/08/2015   HGB 7.2* 01/08/2015   HCT 23.4* 01/08/2015   MCV 98.3 01/08/2015   PLT 265 01/08/2015    [name] Beeper: [phone] 01/09/2015

## 2015-01-09 NOTE — Progress Notes (Signed)
We received a screen request for IP rehab and additionally a Rehab consult has been ordered.  Will await consult completion and recommendations .  The admissions coordinator will follow up accordingly.  Please call if questions.  [place] Admissions Coordinator Cell [phone] Office [phone]

## 2015-01-09 NOTE — Progress Notes (Signed)
Pt currently on 2lnc and tolerating well at this time.  HR77, rr17, spo2 99%.  No increased wob or respiratory distress noted.  Bipap not indicated at this time bur remains in room on standby if needed.  RT will continue to monitor and assess as needed.

## 2015-01-09 NOTE — Procedures (Signed)
Patient was seen on dialysis and the procedure was supervised. BFR 400 Via LAVF BP is 120/68.  Patient appears to be tolerating treatment well.  His hgb dropped to 7 and will transfuse 2 units of PRBC's with HD.

## 2015-01-10 LAB — CBC
HEMATOCRIT: 28.9 % — AB (ref 39.0–52.0)
HEMOGLOBIN: 9 g/dL — AB (ref 13.0–17.0)
MCH: 29.6 pg (ref 26.0–34.0)
MCHC: 31.1 g/dL (ref 30.0–36.0)
MCV: 95.1 fL (ref 78.0–100.0)
Platelets: 277 10*3/uL (ref 150–400)
RBC: 3.04 MIL/uL — AB (ref 4.22–5.81)
RDW: 18.6 % — AB (ref 11.5–15.5)
WBC: 12.7 10*3/uL — ABNORMAL HIGH (ref 4.0–10.5)

## 2015-01-10 LAB — TYPE AND SCREEN
ABO/RH(D): O POS
Antibody Screen: NEGATIVE
Unit division: 0
Unit division: 0

## 2015-01-10 NOTE — Progress Notes (Signed)
  Progress Note    01/10/2015 8:03 AM 5 Days Post-Op  Subjective:  States he is feeling better   Filed Vitals:   01/10/15 0323  BP: 135/68  Pulse: 106  Temp: 98.3 F (36.8 C)  Resp: 20    Physical Exam: Incisions:  Minor bloody drainage on medial aspect of bandage, otherwise, wound is c/d/i with staples Extremities:  Able to hold his leg up without difficulty  CBC    Component Value Date/Time   WBC 10.7* 01/09/2015 0736   RBC 2.33* 01/09/2015 0736   RBC 3.21* 10/08/2012 2241   HGB 7.0* 01/09/2015 0736   HCT 23.0* 01/09/2015 0736   PLT 251 01/09/2015 0736   MCV 98.7 01/09/2015 0736   MCH 30.0 01/09/2015 0736   MCHC 30.4 01/09/2015 0736   RDW 18.3* 01/09/2015 0736   LYMPHSABS 0.7 12/30/2014 1754   MONOABS 0.9 12/30/2014 1754   EOSABS 0.0 12/30/2014 1754   BASOSABS 0.0 12/30/2014 1754    BMET    Component Value Date/Time   NA 132* 01/09/2015 0736   K 4.8 01/09/2015 0736   CL 92* 01/09/2015 0736   CO2 26 01/09/2015 0736   GLUCOSE 162* 01/09/2015 0736   BUN 48* 01/09/2015 0736   CREATININE 7.72* 01/09/2015 0736   CALCIUM 8.6 01/09/2015 0736   CALCIUM 8.1* 06/24/2012 1634   GFRNONAA 6* 01/09/2015 0736   GFRAA 7* 01/09/2015 0736    INR    Component Value Date/Time   INR 1.03 01/05/2015 0610     Intake/Output Summary (Last 24 hours) at 01/10/15 0803 Last data filed at 01/09/15 1841  Gross per 24 hour  Intake   1030 ml  Output   3050 ml  Net  -2020 ml     Assessment/Plan:  81 y.o. male is s/p right below knee amputation  5 Days Post-Op  -pt doing quite well from surgical standpoint -pain is well controlled -stump is viable -hopefully to CIR today (per pt)   Leontine Locket, PA-C [facility] [phone] 01/10/2015 8:03 AM

## 2015-01-10 NOTE — Clinical Social Work Placement (Addendum)
Clinical Social Work Department CLINICAL SOCIAL WORK PLACEMENT NOTE 01/10/2015  Patient:  Randy [name], Randy [name]  Account Number:  [id number] Admit date:  12/30/2014  Clinical Social Worker:  Domenica Reamer, CLINICAL SOCIAL WORKER  Date/time:  01/10/2015 04:24 PM  Clinical Social Work is seeking post-discharge placement for this patient at the following level of care:   SKILLED NURSING   (*CSW will update this form in Epic as items are completed)   01/10/2015  Patient/family provided with [facility] Department of Clinical Social Work's list of facilities offering this level of care within the geographic area requested by the patient (or if unable, by the patient's family).  01/10/2015  Patient/family informed of their freedom to choose among providers that offer the needed level of care, that participate in Medicare, Medicaid or managed care program needed by the patient, have an available bed and are willing to accept the patient.  01/10/2015  Patient/family informed of [facility]' ownership interest in [facility], as well as of the fact that they are under no obligation to receive care at this facility.  PASARR submitted to EDS on 01/10/2015 PASARR number received on 01/10/2015  FL2 transmitted to all facilities in geographic area requested by pt/family on  01/10/2015 FL2 transmitted to all facilities within larger geographic area on   Patient informed that his/her managed care company has contracts with or will negotiate with  certain facilities, including the following:     Patient/family informed of bed offers received: 01/11/15  Patient chooses bed at  Physician recommends and patient chooses bed at    Patient to be transferred to  on   Patient to be transferred to facility by  Patient and family notified of transfer on  Name of family member notified:    The following physician request were entered in Epic:  Additional Comments: 01/11/15: CSW Intern Chana Bode  talked with patient's wife regarding faciity bed offers. Patient's preferences ([facility]) could not take him and Mrs. Heckmann asked that CSW talk with patient to get his decision: [facility], [place] with wife transporting to dialysis or home. Patient in dialysis late and CSW unable to talk with Mr. Lizotte.   Kelechi Orgeron Givens, MSW, LCSW - ([birth year]38/[birth year]) Licensed Clinical Social Worker [place] [phone]      Domenica Reamer, [place] Social Worker [phone]

## 2015-01-10 NOTE — Clinical Social Work Psychosocial (Signed)
Clinical Social Work Department BRIEF PSYCHOSOCIAL ASSESSMENT 01/10/2015  Patient:  Randy [name], Randy [name]     Account Number:  [id number]     Admit date:  12/30/2014  Clinical Social Worker:  Domenica Reamer, CLINICAL SOCIAL WORKER  Date/Time:  01/10/2015 12:00 N  Referred by:  Physician  Date Referred:  01/10/2015 Referred for  SNF Placement   Other Referral:   Interview type:  Patient Other interview type:    PSYCHOSOCIAL DATA Living Status:  WIFE Admitted from facility:   Level of care:   Primary support name:  Bernice Primary support relationship to patient:  SPOUSE Degree of support available:   high level of support- pt lives with wife and has 3 children who live locally and visit daily    CURRENT CONCERNS Current Concerns  Post-Acute Placement   Other Concerns:    SOCIAL WORK ASSESSMENT / PLAN CSW spoke with pt concerning potential SNF placement if CIR is unable to accept pt when ready.  Pt is agreeable to SNF but states that he would prefer CIR so that he would be in the hospital.   Assessment/plan status:  Psychosocial Support/Ongoing Assessment of Needs Other assessment/ plan:   FL2  PASAR   Information/referral to community resources:    PATIENT'S/FAMILY'S RESPONSE TO PLAN OF CARE: Patient is agreeable to SNF placement if unable to go to CIR- pt recognizes that he is not able to go home right away and will need to regain strength before its safe to return home.       Domenica Reamer, [place] Social Worker [phone]

## 2015-01-10 NOTE — Progress Notes (Signed)
Rehab admissions - I have sent clinicals to insurance carrier requesting acute inpatient rehab admission.  No bed open at this time.  I will update all once I hear back from insurance case manager.  Call me for questions.  #[phone]

## 2015-01-10 NOTE — Progress Notes (Signed)
Occupational Therapy Treatment- Reeval Patient Details Name: Randy [name] MRN: [id number] DOB: 1932/01/04 Today's Date: 01/10/2015    History of present illness Patient is an 81 yo male admitted 12/30/14 with Rt foot pain and cellulitis.  Patient had recent fall and injured Rt foot.  Following vascular surgery work-up for peripheral vascular disease, pt is now s/p R BKA.  PMH:  HLD, HTN, ESRD now on HD, CHF, CAD, cardiomyopathy, NSTEMI   OT comments  Pt currently s/p R BKA and activity at bed level this session. Pt able to complete bed mobility supervision with bed rails to log roll. Pt tolerated well.    Follow Up Recommendations  CIR    Equipment Recommendations  Other (comment) (defer)    Recommendations for Other Services Rehab consult    Precautions / Restrictions Precautions Precautions: Fall Precaution Comments: R BKA       Mobility Bed Mobility Overal bed mobility: Needs Assistance Bed Mobility: Rolling Rolling: Supervision         General bed mobility comments: Pt able to use bed rail to complete task and skin intact no break down noted  Transfers                      Balance                                   ADL Overall ADL's : Needs assistance/impaired                                       General ADL Comments: Pt supine and R BKA knee immobilizer (short white) off with R BKA on one pillow. Pt agreeable to education and bed level exercises. pt demonstrates hip flexion, knee extension, hip abduction x10. Pt educated on touching R residual limb to decr phantom pain and pt doing this and reports "that really work!" Pt log rolling R and L superivion using bed rail      Tourist information centre manager   Behavior During Therapy: [facility] for tasks assessed/performed Overall Cognitive Status: Within Functional Limits for tasks assessed                        Extremity/Trunk Assessment               Exercises Amputee Exercises Hip ABduction/ADduction: Right;AROM;10 reps;Supine Knee Extension: AROM;10 reps;Right;Supine   Shoulder Instructions       General Comments      Pertinent Vitals/ Pain       Pain Assessment: No/denies pain  Home Living                                          Prior Functioning/Environment              Frequency Min 2X/week     Progress Toward Goals  OT Goals(current goals can now be found in the care plan section)  Progress towards OT goals: Progressing toward goals  Acute Rehab OT Goals Patient Stated Goal: to get back home  OT Goal Formulation: With patient Time For Goal Achievement: 01/24/15 Potential to Achieve Goals: Good ADL Goals Pt Will Perform Grooming: with supervision;sitting Pt Will Perform Upper Body Bathing: with supervision;sitting Pt Will Transfer to Toilet: with max assist;with transfer board  Plan Discharge plan needs to be updated    Co-evaluation                 End of Session     Activity Tolerance Patient tolerated treatment well   Patient Left in bed;with call bell/phone within reach   Nurse Communication Mobility status;Precautions        Time: 7591-6384 OT Time Calculation (min): 15 min  Charges: OT General Charges $OT Visit: 1 Procedure OT Evaluation $OT Re-eval: 1 Procedure  Peri Maris 01/10/2015, 2:27 PM  Pager: [phone]

## 2015-01-10 NOTE — Progress Notes (Signed)
Patient ID: Randy [name], male   DOB: [birth year], 81 y.o.   MRN: [id number]  [facility] Progress Note    Subjective:   Feels okay but had an infiltration of his AVF at the end of HD yesterday   Objective:   BP 127/66 mmHg  Pulse 73  Temp(Src) 97.9 F (36.6 C) (Oral)  Resp 17  Ht 5\' 9"  (1.753 m)  Wt 71.9 kg (158 lb 8.2 oz)  BMI 23.40 kg/m2  SpO2 100%  Intake/Output: I/O last 3 completed shifts: In: 1033 [P.O.:360; I.V.:3; Blood:670] Out: 3050 [Other:3050]   Intake/Output this shift:    Weight change: -1.6 kg (-3 lb 8.4 oz)  Physical Exam: Gen:WD WN AAM in NAD CVS:no rub Resp:scattered rhonchi and exp wheezes LKT:GYBWLS Ext:+ecchymosis over venous aspect of Left AVF, +T/B, s/p RBKA  Labs: BMET  Recent Labs Lab 01/03/15 1115 01/04/15 1900 01/05/15 0610 01/06/15 0520 01/07/15 0405 01/09/15 0736  NA 129* 133* 138 134* 136 132*  K 3.6 3.0* 4.1 4.9 4.4 4.8  CL 89* 94* 100 97 96 92*  CO2 24 27 29 23 26 26   GLUCOSE 147* 135* 83 104* 107* 162*  BUN 61* 23 15 26* 21 48*  CREATININE 9.29* 4.73* 4.13* 6.04* 4.67* 7.72*  ALBUMIN 2.1*  --   --   --   --   --   CALCIUM 8.7 8.2* 8.5 9.1 8.8 8.6  PHOS 5.8*  --   --   --   --   --    CBC  Recent Labs Lab 01/06/15 0520 01/07/15 0405 01/08/15 0309 01/09/15 0736  WBC 10.3 8.5 7.9 10.7*  HGB 8.5* 7.5* 7.2* 7.0*  HCT 28.1* 24.5* 23.4* 23.0*  MCV 100.4* 98.8 98.3 98.7  PLT 331 261 265 251    @IMGRELPRIORS @ Medications:    . sodium chloride   Intravenous Once  . antiseptic oral rinse  7 mL Mouth Rinse BID  . aspirin  325 mg Oral Daily  . atorvastatin  40 mg Oral q1800  . calcitRIOL  0.5 mcg Oral Daily  . carvedilol  25 mg Oral BID WC  . cloNIDine  0.2 mg Oral BID  . darbepoetin (ARANESP) injection - DIALYSIS  100 mcg Intravenous Q Sat-HD  . docusate sodium  100 mg Oral Daily  . feeding supplement (RESOURCE BREEZE)  1 Container Oral TID BM  . heparin  5,000 Units Subcutaneous 3 times per day   . ipratropium-albuterol  3 mL Nebulization Q4H  . methylPREDNISolone (SOLU-MEDROL) injection  40 mg Intravenous Q12H  . multivitamin  1 tablet Oral Daily  . pantoprazole  40 mg Oral Daily  . piperacillin-tazobactam (ZOSYN)  IV  2.25 g Intravenous Q8H  . polyethylene glycol  17 g Oral Daily  . rOPINIRole  0.25-0.5 mg Oral QHS  . sevelamer carbonate  4,000 mg Oral TID WC  . sodium chloride  3 mL Intravenous Q12H  . vancomycin  750 mg Intravenous Q T,Th,Sa-HD     Assessment/ Plan:   1. PVD- s/p right BKA 2. ESRD- normally TTS, off schedule due to pulmonary edema following surgery.  Now with infiltration and hematoma over avf.  Will hold off on getting back on schedule for now and plan for HD tomorrow. 3. Pulmonary edema- needed extra HD session off schedule due to pulmonary edema following surgery, improved and will cont to challenge edw. 4. Anemia: cont with Aranesp and transfuse prn (received 2 units with HD yesterday) 5. CKD-MBD: continue with binders and  vitamin D 6. Nutrition:per primary 7. Hypertension:stable 8. Nodule in right kidney Randy [name] 01/10/2015, 9:39 AM

## 2015-01-10 NOTE — Clinical Social Work Note (Signed)
CSW met with pt- he is agreeable to SNF placement if unable to go to CIR.  Pt is not currently on outpatient HD schedule- anticipated that pt will be back on normal schedule after Saturday- not appropriate for SNF until back on normal schedule.  CSW has initiated bed search in case CIR unable to admit.  Full assessment to follow.   Domenica Reamer, [place] Social Worker [phone]

## 2015-01-10 NOTE — Progress Notes (Signed)
Physical Therapy Treatment Patient Details Name: Randy [name] MRN: [id number] DOB: May 02, 1932 Today's Date: 01/10/2015    History of Present Illness Patient is an 81 yo male admitted 12/30/14 with Rt foot pain and cellulitis.  Patient had recent fall and injured Rt foot.  Following vascular surgery work-up for peripheral vascular disease, pt is now s/p R BKA.  PMH:  HLD, HTN, ESRD now on HD, CHF, CAD, cardiomyopathy, NSTEMI    PT Comments    Continuing progress with functional mobility; able to walk today on LLE with UE support on RW; Continue to recommend comprehensive inpatient rehab (CIR) for post-acute therapy needs.   Follow Up Recommendations  CIR     Equipment Recommendations  Wheelchair (measurements PT);Wheelchair cushion (measurements PT)    Recommendations for Other Services Rehab consult     Precautions / Restrictions Precautions Precautions: Fall Precaution Comments: R BKA Required Braces or Orthoses: Other Brace/Splint Other Brace/Splint: rigid residual limb knee immoblilzer    Mobility  Bed Mobility Overal bed mobility: Needs Assistance Bed Mobility: Rolling Rolling: Supervision   Supine to sit: Mod assist     General bed mobility comments: Used bed pad to square off hips at EOB  Transfers Overall transfer level: Needs assistance Equipment used: Rolling walker (2 wheeled) Transfers: Sit to/from Stand Sit to Stand: Mod assist;+2 physical assistance         General transfer comment: Verbal cues for hand placement. Assist to steady during transition. Used RW instead of quad cane in order to work on single limb balance exercises.  Ambulation/Gait Ambulation/Gait assistance: Min assist;+2 safety/equipment Ambulation Distance (Feet): 10 Feet Assistive device: Rolling walker (2 wheeled) Gait Pattern/deviations: Step-to pattern Gait velocity: Decreased   General Gait Details: Good use of RW to maintian balance on LLE; cues to fully exted R hip as pt  tended to keep R hip flexed; some unsteadiness warranting min assist   Stairs            Wheelchair Mobility    Modified Rankin (Stroke Patients Only)       Balance     Sitting balance-Leahy Scale: Fair       Standing balance-Leahy Scale: Poor                      Cognition Arousal/Alertness: Awake/alert Behavior During Therapy: WFL for tasks assessed/performed Overall Cognitive Status: Within Functional Limits for tasks assessed                      Exercises General Exercises - Lower Extremity Ankle Circles/Pumps:  (HIp extension in standing x10) Amputee Exercises Hip ABduction/ADduction: Right;AROM;10 reps;Supine Knee Extension: AROM;10 reps;Right;Supine    General Comments General comments (skin integrity, edema, etc.): session conducted on Room Air and O2 sats 97%      Pertinent Vitals/Pain Pain Assessment: No/denies pain Pain Descriptors / Indicators: Aching Pain Intervention(s): Limited activity within patient's tolerance;Monitored during session;Repositioned    Home Living                      Prior Function            PT Goals (current goals can now be found in the care plan section) Acute Rehab PT Goals Patient Stated Goal: to get back home PT Goal Formulation: With patient Time For Goal Achievement: 01/18/15 Potential to Achieve Goals: Good Progress towards PT goals: Progressing toward goals    Frequency  Min 3X/week    PT  Plan Current plan remains appropriate    Co-evaluation             End of Session Equipment Utilized During Treatment: Gait belt Activity Tolerance: Patient tolerated treatment well Patient left: in chair;with call bell/phone within reach;with chair alarm set     Time: 6387-5643 PT Time Calculation (min) (ACUTE ONLY): 21 min  Charges:  $Gait Training: 8-22 mins                    G Codes:      [name] 01/10/2015, 4:21 PM  Roney Marion, [place] Pager [phone] Office [phone]

## 2015-01-10 NOTE — Progress Notes (Signed)
PROGRESS NOTE  ARLYNN MCDERMID MAU:[id number] DOB: 09-11-1932 DOA: 12/30/2014 PCP: [name], MD  Assessment/Plan: 81 year old hx of HLd, HTN, ESRD on peritoneal dialysis (last dialysis was yesterday), gout, history of pericarditis, history of lower GI bleeding, anemia, congestive heart failure, on home oxygen, CAD, who presents with right foot pain. He presents with a nonhealing wound of the right foot and multilevel arterial occlusive disease. He was not a candidate for revascularization. He underwent AKA 4-14.  24 hours Post operative he develops acute respiratory Distress. His respiratory failure improved with BIPAP, nebulizer treatment and emergent dialysis. He was diagnosed also with PNA.   Patient respiratory status has improved. He is awaiting bed available at CIR.   Acute  Respiratory failure: secondary to pulmonary edema. Systolic HF exacerbation. Patient with increase work of breathing this morning. Diffuse wheezing and crackles lung exam on 4-15. Patient respiratory status improved with dialysis and nebulizer on 4-15. BIPAP PRN, nebulizer treatments.  -Continue with nebulizer treatments.  -Chest x ray 4-17 showed infiltrates.  -Continue with solumedrol, IV antibiotics due to chest x ray finding, and nebulizer.  -Transition to prednisone on 4-20 and oral antibiotic to finish treatment for PNA.  -Leukocytosis probably related to steroids.   Right foot pain: S/P AKA.  -treated outpatient with Keflex due to suspected cellulitis,without improvement.  - had arterial Seg signal doppler study on 12/27/14, which showed evidence of significant peripheral small vessel tibial disease. -IV abx, continue with Vancomycin.  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 -Discussed with Dr.  Scot Dock, ok to stop IV antibiotics from vascular point.   Acute blood loss anemia; S/P 2 units PRBC dialysis 4-18. Hb stable.   ESRD-HD with hyperkalemia at admission: renal for HD Had Emergent dialysis  4-15. Had dialysis 4-16, 4-18.   Hypertension: -Continue home medications: Amlodipine, clonidine, hydralazine, Coreg -hold lisinopril prior surgery.   Hyperlipidemia: -Lipitor  GERD: -Protonix  CAD: Currently no chest pain. EKG has no new change. -Continue aspirin and Lipitor  Kidney mass:  incidental finding on his CT scan was a 2 cm enhancing mass in the midpole of the left kidney concerning for possible neoplasm or malignancy. MRI: Follow up CT without and with contrast in 1 year suggested to assess the stability of this lesion.  DVT prophylaxis: heparin.   Code Status: full Family Communication: patient/ Disposition Plan: continue to monitor step down.    Consultants:  Vascular  Nephrologist   Procedures: Right below the knee amputation 4-14.   HPI/Subjective: He is feeling better, breathing better.    Objective: Filed Vitals:   01/10/15 1640  BP: 117/83  Pulse: 74  Temp: 98.6 F (37 C)  Resp: 18    Intake/Output Summary (Last 24 hours) at 01/10/15 1818 Last data filed at 01/10/15 0920  Gross per 24 hour  Intake    773 ml  Output      0 ml  Net    773 ml   Filed Weights   01/09/15 1115 01/10/15 0500 01/10/15 1323  Weight: 71.6 kg (157 lb 13.6 oz) 71.9 kg (158 lb 8.2 oz) 71.5 kg (157 lb 10.1 oz)    Exam:   General: Speaking full sentences. Alert.   Cardiovascular: rrr  Respiratory: Bilateral [place].   Abdomen: +BS, soft, nt  Musculoskeletal: dressing in place post Right AKA.   Data Reviewed: Basic Metabolic Panel:  Recent Labs Lab 01/04/15 1900 01/05/15 0610 01/06/15 0520 01/07/15 0405 01/09/15 0736  NA 133* 138 134* 136 132*  K 3.0* 4.1 4.9 4.4 4.8  CL 94* 100 97 96 92*  CO2 27 29 23 26 26   GLUCOSE 135* 83 104* 107* 162*  BUN 23 15 26* 21 48*  CREATININE 4.73* 4.13* 6.04* 4.67* 7.72*  CALCIUM 8.2* 8.5 9.1 8.8 8.6   Liver Function Tests: No results for input(s): AST, ALT, ALKPHOS, BILITOT, PROT, ALBUMIN in the last 168  hours. No results for input(s): LIPASE, AMYLASE in the last 168 hours. No results for input(s): AMMONIA in the last 168 hours. CBC:  Recent Labs Lab 01/06/15 0520 01/07/15 0405 01/08/15 0309 01/09/15 0736 01/10/15 1050  WBC 10.3 8.5 7.9 10.7* 12.7*  HGB 8.5* 7.5* 7.2* 7.0* 9.0*  HCT 28.1* 24.5* 23.4* 23.0* 28.9*  MCV 100.4* 98.8 98.3 98.7 95.1  PLT 331 261 265 251 277   Cardiac Enzymes: No results for input(s): CKTOTAL, CKMB, CKMBINDEX, TROPONINI in the last 168 hours. BNP (last 3 results)  Recent Labs  01/08/15 1118  BNP >4500.0*    ProBNP (last 3 results)  Recent Labs  06/21/14 1046 08/17/14 0625 08/29/14 0928  PROBNP >70000.0* >70000.0* >70000.0*    CBG: No results for input(s): GLUCAP in the last 168 hours.  Recent Results (from the past 240 hour(s))  Surgical pcr screen     Status: None   Collection Time: 01/04/15 10:54 AM  Result Value Ref Range Status   MRSA, PCR NEGATIVE NEGATIVE Final   Staphylococcus aureus NEGATIVE NEGATIVE Final    Comment:        The Xpert SA Assay (FDA approved for NASAL specimens in patients over 2 years of age), is one component of a comprehensive surveillance program.  Test performance has been validated by [facility] for patients greater than or equal to 61 year old. It is not intended to diagnose infection nor to guide or monitor treatment.   Culture, expectorated sputum-assessment     Status: None   Collection Time: 01/08/15 12:56 PM  Result Value Ref Range Status   Specimen Description SPUTUM  Final   Special Requests Normal  Final   Sputum evaluation   Final    MICROSCOPIC FINDINGS SUGGEST THAT THIS SPECIMEN IS NOT REPRESENTATIVE OF LOWER RESPIRATORY SECRETIONS. PLEASE RECOLLECT. CALLED RN [facility] AT [phone] MARTINB   Report Status 01/08/2015 FINAL  Final     Studies: No results found.  Scheduled Meds: . antiseptic oral rinse  7 mL Mouth Rinse BID  . aspirin  325 mg Oral Daily  . atorvastatin   40 mg Oral q1800  . calcitRIOL  0.5 mcg Oral Daily  . carvedilol  25 mg Oral BID WC  . cloNIDine  0.2 mg Oral BID  . darbepoetin (ARANESP) injection - DIALYSIS  100 mcg Intravenous Q Sat-HD  . docusate sodium  100 mg Oral Daily  . feeding supplement (RESOURCE BREEZE)  1 Container Oral TID BM  . heparin  5,000 Units Subcutaneous 3 times per day  . ipratropium-albuterol  3 mL Nebulization Q4H  . methylPREDNISolone (SOLU-MEDROL) injection  40 mg Intravenous Q12H  . multivitamin  1 tablet Oral Daily  . pantoprazole  40 mg Oral Daily  . piperacillin-tazobactam (ZOSYN)  IV  2.25 g Intravenous Q8H  . polyethylene glycol  17 g Oral Daily  . rOPINIRole  0.25-0.5 mg Oral QHS  . sevelamer carbonate  4,000 mg Oral TID WC  . sodium chloride  3 mL Intravenous Q12H  . vancomycin  750 mg Intravenous Q T,Th,Sa-HD   Continuous Infusions:  Antibiotics Given (last 72 hours)    Date/Time Action Medication Dose Rate   01/07/15 2210 Given   piperacillin-tazobactam (ZOSYN) IVPB 2.25 g 2.25 g 100 mL/hr   01/08/15 0532 Given   piperacillin-tazobactam (ZOSYN) IVPB 2.25 g 2.25 g 100 mL/hr   01/08/15 1357 Given   piperacillin-tazobactam (ZOSYN) IVPB 2.25 g 2.25 g 100 mL/hr   01/08/15 2216 Given   piperacillin-tazobactam (ZOSYN) IVPB 2.25 g 2.25 g 100 mL/hr   01/09/15 1050 Given   vancomycin (VANCOCIN) IVPB 750 mg/150 ml premix 750 mg 150 mL/hr   01/09/15 1500 Given   piperacillin-tazobactam (ZOSYN) IVPB 2.25 g 2.25 g 100 mL/hr   01/09/15 2220 Given   piperacillin-tazobactam (ZOSYN) IVPB 2.25 g 2.25 g 100 mL/hr   01/10/15 0814 Given   piperacillin-tazobactam (ZOSYN) IVPB 2.25 g 2.25 g 100 mL/hr      Principal Problem:   Foot pain, right Active Problems:   HLD (hyperlipidemia)   Polycystic kidney   Lower GI bleed   ESRD (end stage renal disease) on dialysis   Foot pain   CHF (congestive heart failure)   CAD (coronary artery disease)   Hyperkalemia   Renal mass    Time spent: 35  min    Gracey Tolle A  [facility] Pager [phone] If 7PM-7AM, please contact night-coverage at [email or web address], password [facility] 01/10/2015, 6:18 PM  LOS: 11 days

## 2015-01-11 DIAGNOSIS — I5032 Chronic diastolic (congestive) heart failure: Secondary | ICD-10-CM

## 2015-01-11 LAB — RENAL FUNCTION PANEL
ANION GAP: 19 — AB (ref 5–15)
Albumin: 2.3 g/dL — ABNORMAL LOW (ref 3.5–5.2)
BUN: 78 mg/dL — AB (ref 6–23)
CHLORIDE: 92 mmol/L — AB (ref 96–112)
CO2: 22 mmol/L (ref 19–32)
Calcium: 9 mg/dL (ref 8.4–10.5)
Creatinine, Ser: 8.2 mg/dL — ABNORMAL HIGH (ref 0.50–1.35)
GFR calc non Af Amer: 5 mL/min — ABNORMAL LOW (ref >90)
GFR, EST AFRICAN AMERICAN: 6 mL/min — AB (ref >90)
Glucose, Bld: 133 mg/dL — ABNORMAL HIGH (ref 70–99)
Phosphorus: 6.1 mg/dL — ABNORMAL HIGH (ref 2.3–4.6)
Potassium: 5.2 mmol/L — ABNORMAL HIGH (ref 3.5–5.1)
Sodium: 133 mmol/L — ABNORMAL LOW (ref 135–145)

## 2015-01-11 MED ORDER — OXYCODONE-ACETAMINOPHEN 5-325 MG PO TABS
ORAL_TABLET | ORAL | Status: AC
  Filled 2015-01-11: qty 1

## 2015-01-11 MED ORDER — VANCOMYCIN HCL IN DEXTROSE 750-5 MG/150ML-% IV SOLN
750.0000 mg | INTRAVENOUS | Status: AC
  Administered 2015-01-11: 750 mg via INTRAVENOUS
  Filled 2015-01-11: qty 150

## 2015-01-11 MED ORDER — DOXYCYCLINE HYCLATE 100 MG PO TABS
100.0000 mg | ORAL_TABLET | Freq: Two times a day (BID) | ORAL | Status: DC
  Administered 2015-01-12 – 2015-01-13 (×4): 100 mg via ORAL
  Filled 2015-01-11 (×5): qty 1

## 2015-01-11 MED ORDER — MORPHINE SULFATE 2 MG/ML IJ SOLN
INTRAMUSCULAR | Status: AC
  Filled 2015-01-11: qty 1

## 2015-01-11 MED ORDER — PREDNISONE 50 MG PO TABS
50.0000 mg | ORAL_TABLET | Freq: Every day | ORAL | Status: DC
  Administered 2015-01-12 – 2015-01-13 (×2): 50 mg via ORAL
  Filled 2015-01-11 (×3): qty 1

## 2015-01-11 NOTE — Clinical Social Work Note (Signed)
CSW talked with patient and his niece (at the bedside) regarding his decision on SNF, at 6:28 pm. Randy [name] plans to discharge home, stating that he is not going to a facility and does not want to go to [place]. When asked, patient responded that he will talk with his wife regarding his decision. Patient also indicated that he wants his dialysis changed from [facility] to [facility] with Dr. Florene Glen. CSW informed patient and niece that this information will be communicated to the appropriate persons on Thursday.   Randy [name], MSW, LCSW Licensed Clinical Social Worker [place] [phone]

## 2015-01-11 NOTE — Progress Notes (Signed)
RT note-post nebulizer treatment, expiratory wheezes.

## 2015-01-11 NOTE — Progress Notes (Signed)
Pt has a PRN order for BIPAP. He is currently in no distress and does not need the machine at this time.

## 2015-01-11 NOTE — Progress Notes (Signed)
Rehab admissions - I have received a denial for acute inpatient rehab admission from [facility] carrier.  Options will be SNF for therapies at this point.  Call me for questions.  #[phone]

## 2015-01-11 NOTE — Progress Notes (Signed)
Patient ID: Randy [name], male   DOB: 11/02/1931, 81 y.o.   MRN: [id number]  [facility] Progress Note    Subjective:   No new complaints   Objective:   BP 132/74 mmHg  Pulse 78  Temp(Src) 98.3 F (36.8 C) (Axillary)  Resp 17  Ht 5\' 9"  (1.753 m)  Wt 71.5 kg (157 lb 10.1 oz)  BMI 23.27 kg/m2  SpO2 100%  Intake/Output: I/O last 3 completed shifts: In: 613 [P.O.:560; I.V.:3; IV Piggyback:50] Out: -    Intake/Output this shift:  Total I/O In: 240 [P.O.:240] Out: -  Weight change: -0.1 kg (-3.5 oz)  Physical Exam: Gen:WD Elderly AAm in NAD CVS:no rub Resp:occ rhonchi GYK:ZLDJTT, +BS, PD cath in LUQ, dressings in place, no tenderness Ext:s/p RBKA, L AVF +T/B with infiltration near venous portion of fistula.  Labs: BMET  Recent Labs Lab 01/04/15 1900 01/05/15 0610 01/06/15 0520 01/07/15 0405 01/09/15 0736  NA 133* 138 134* 136 132*  K 3.0* 4.1 4.9 4.4 4.8  CL 94* 100 97 96 92*  CO2 27 29 23 26 26   GLUCOSE 135* 83 104* 107* 162*  BUN 23 15 26* 21 48*  CREATININE 4.73* 4.13* 6.04* 4.67* 7.72*  CALCIUM 8.2* 8.5 9.1 8.8 8.6   CBC  Recent Labs Lab 01/07/15 0405 01/08/15 0309 01/09/15 0736 01/10/15 1050  WBC 8.5 7.9 10.7* 12.7*  HGB 7.5* 7.2* 7.0* 9.0*  HCT 24.5* 23.4* 23.0* 28.9*  MCV 98.8 98.3 98.7 95.1  PLT 261 265 251 277    @IMGRELPRIORS @ Medications:    . antiseptic oral rinse  7 mL Mouth Rinse BID  . aspirin  325 mg Oral Daily  . atorvastatin  40 mg Oral q1800  . calcitRIOL  0.5 mcg Oral Daily  . carvedilol  25 mg Oral BID WC  . cloNIDine  0.2 mg Oral BID  . darbepoetin (ARANESP) injection - DIALYSIS  100 mcg Intravenous Q Sat-HD  . docusate sodium  100 mg Oral Daily  . feeding supplement (RESOURCE BREEZE)  1 Container Oral TID BM  . heparin  5,000 Units Subcutaneous 3 times per day  . ipratropium-albuterol  3 mL Nebulization Q4H  . methylPREDNISolone (SOLU-MEDROL) injection  40 mg Intravenous Q12H  . multivitamin  1  tablet Oral Daily  . pantoprazole  40 mg Oral Daily  . piperacillin-tazobactam (ZOSYN)  IV  2.25 g Intravenous Q8H  . polyethylene glycol  17 g Oral Daily  . rOPINIRole  0.25-0.5 mg Oral QHS  . sevelamer carbonate  4,000 mg Oral TID WC  . sodium chloride  3 mL Intravenous Q12H  . vancomycin  750 mg Intravenous Q Wed-HD     Assessment/ Plan:   1. PVD- s/p right BKA 2. ESRD- normally TTS, off schedule due to pulmonary edema following surgery. Now with infiltration and hematoma over avf. Will hold off on getting back on schedule for now and plan for HD tomorrow. 3. Pulmonary edema- needed extra HD session off schedule due to pulmonary edema following surgery, improved and will cont to challenge edw. 4. Anemia: cont with Aranesp and transfuse prn (received 2 units with HD yesterday) 5. CKD-MBD: continue with binders and vitamin D 6. Nutrition:per primary 7. Vascular access- L AVF with infiltration, improving.  Also has PD catheter which will need to be removed as he has failed PD as an outpt (placed by someone in [place]). 8. Hypertension:stable 9. Nodule in right kidney  Angelus Hoopes A 01/11/2015, 12:49 PM

## 2015-01-11 NOTE — Care Management Note (Signed)
CARE MANAGEMENT NOTE 01/11/2015  Patient:  Randy [name], Randy [name]   Account Number:  [id number]  Date Initiated:  01/03/2015  Documentation initiated by:  Leeah Politano  Subjective/Objective Assessment:   CM following for progression and d/c planning.     Action/Plan:   Plan to d/c to home with [facility] services and will get second opinion as outpatient.  01/05/2015 Pt to OR today.  01/11/15 CIR denied by pt insurance , SNF pending, CSW working with pt.   Anticipated DC Date:  01/12/2015   Anticipated DC Plan:  SKILLED NURSING FACILITY         Choice offered to / List presented to:             Status of service:  Completed, signed off Medicare Important Message given?  YES (If response is "NO", the following Medicare IM given date fields will be blank) Date Medicare IM given:  01/10/2015 Medicare IM given by:  MAYO,HENRIETTA Date Additional Medicare IM given:  01/05/2015 Additional Medicare IM given by:  [facility]  Discharge Disposition:    Per UR Regulation:    If discussed at Long Length of Stay Meetings, dates discussed:   01/10/2015    Comments:  01/11/2015 Pt moved to 6E, noted that pt insurance has denied CIR, plan now is for SNF, CSW, V Crawford aware and working with pt . CRoyal RN MPH, case manager, [phone]  01/10/15 [place] Transferred to [place] 2/2 need for BiPAP, now on 2L/min [place]. Plan is xfer to CIR, pt refused to participate with PT - counseled that insurance will not approve if he is not participating.

## 2015-01-11 NOTE — Progress Notes (Signed)
PROGRESS NOTE  Randy [name] PJK:[id number] DOB: September 18, 1932 DOA: 12/30/2014 PCP: [name], MD  Assessment/Plan: 81 year old hx of HLd, HTN, ESRD on peritoneal dialysis (last dialysis was yesterday), gout, history of pericarditis, history of lower GI bleeding, anemia, congestive heart failure, on home oxygen, CAD, who presents with right foot pain. He presents with a nonhealing wound of the right foot and multilevel arterial occlusive disease. He was not a candidate for revascularization. He underwent AKA 4-14.  24 hours Post operative he develops acute respiratory Distress. His respiratory failure improved with BIPAP, nebulizer treatment and emergent dialysis. He was diagnosed also with PNA.   Acute  Respiratory failure: secondary to pulmonary edema. Systolic HF exacerbation. Patient with increase work of breathing this morning. Diffuse wheezing and crackles lung exam on 4-15. Patient respiratory status improved with dialysis and nebulizer on 4-15. BIPAP PRN, nebulizer treatments.  -Continue with nebulizer treatments.  -Chest x ray 4-17 showed infiltrates.  -Continue with solumedrol, IV antibiotics due to chest x ray finding, and nebulizer.  -Transition to prednisone on 4-20 and oral antibiotic to finish treatment for PNA.  -Leukocytosis probably related to steroids.   Right foot pain: S/P AKA.   - had arterial Seg signal doppler study on 12/27/14, which showed evidence of significant peripheral small vessel tibial disease.  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 -antibiotics switch to PO doxycycline and plan is to complete 5 days for hx of superimposed cellulitis  Acute blood loss anemia; S/P 2 units PRBC dialysis 4-18. Hb stable after transfusion   ESRD-HD with hyperkalemia at admission: renal for HD Had Emergent dialysis 4-15. Had dialysis 4-16, 4-18.  Next treatment on 4/20 Will follow renal service rec's  Hypertension: -Continue home medications: Amlodipine, clonidine,  hydralazine, Coreg  Hyperlipidemia: -Lipitor  GERD: -Protonix  CAD: Currently no chest pain. EKG has no new change. -Continue aspirin and Lipitor  Kidney mass:  incidental finding on his CT scan was a 2 cm enhancing mass in the midpole of the left kidney concerning for possible neoplasm or malignancy. MRI: Follow up CT without and with contrast in 1 year suggested to assess the stability of this lesion.  Deconditioning: will need SNF vs CIR;  insurance has denied CIR -will follow SW and patient/family decision   DVT prophylaxis: SCD's  Code Status: full Family Communication: patient/ Disposition Plan: continue to monitor step down.    Consultants:  Vascular  Nephrologist   Procedures: Right below the knee amputation 4-14.   HPI/Subjective: He is feeling overall better and breathing easier. No CP or SOB.    Objective: Filed Vitals:   01/11/15 2120  BP:   Pulse:   Temp:   Resp: 16    Intake/Output Summary (Last 24 hours) at 01/11/15 2332 Last data filed at 01/11/15 1754  Gross per 24 hour  Intake    320 ml  Output   3500 ml  Net  -3180 ml   Filed Weights   01/10/15 0500 01/10/15 1323 01/11/15 1345  Weight: 71.9 kg (158 lb 8.2 oz) 71.5 kg (157 lb 10.1 oz) 74 kg (163 lb 2.3 oz)    Exam:   General: Speaking full sentences. Alert, awake and oriented X3. Afebrile. Complaining of pain on right leg 7/10  Cardiovascular: regular rate, positive SEM, no rubs or gallops  Respiratory: Bilateral Rhonchi, no wheezing.    Abdomen: +BS, soft, nt  Musculoskeletal: dressing in place post Right AKA.   Data Reviewed: Basic Metabolic Panel:  Recent Labs Lab  01/05/15 0610 01/06/15 0520 01/07/15 0405 01/09/15 0736 01/11/15 1105  NA 138 134* 136 132* 133*  K 4.1 4.9 4.4 4.8 5.2*  CL 100 97 96 92* 92*  CO2 29 23 26 26 22   GLUCOSE 83 104* 107* 162* 133*  BUN 15 26* 21 48* 78*  CREATININE 4.13* 6.04* 4.67* 7.72* 8.20*  CALCIUM 8.5 9.1 8.8 8.6 9.0  PHOS  --    --   --   --  6.1*   Liver Function Tests:  Recent Labs Lab 01/11/15 1105  ALBUMIN 2.3*   CBC:  Recent Labs Lab 01/06/15 0520 01/07/15 0405 01/08/15 0309 01/09/15 0736 01/10/15 1050  WBC 10.3 8.5 7.9 10.7* 12.7*  HGB 8.5* 7.5* 7.2* 7.0* 9.0*  HCT 28.1* 24.5* 23.4* 23.0* 28.9*  MCV 100.4* 98.8 98.3 98.7 95.1  PLT 331 261 265 251 277   BNP (last 3 results)  Recent Labs  01/08/15 1118  BNP >4500.0*    ProBNP (last 3 results)  Recent Labs  06/21/14 1046 08/17/14 0625 08/29/14 0928  PROBNP >70000.0* >70000.0* >70000.0*    CBG: No results for input(s): GLUCAP in the last 168 hours.  Recent Results (from the past 240 hour(s))  Surgical pcr screen     Status: None   Collection Time: 01/04/15 10:54 AM  Result Value Ref Range Status   MRSA, PCR NEGATIVE NEGATIVE Final   Staphylococcus aureus NEGATIVE NEGATIVE Final    Comment:        The Xpert SA Assay (FDA approved for NASAL specimens in patients over 61 years of age), is one component of a comprehensive surveillance program.  Test performance has been validated by [facility] for patients greater than or equal to 85 year old. It is not intended to diagnose infection nor to guide or monitor treatment.   Culture, expectorated sputum-assessment     Status: None   Collection Time: 01/08/15 12:56 PM  Result Value Ref Range Status   Specimen Description SPUTUM  Final   Special Requests Normal  Final   Sputum evaluation   Final    MICROSCOPIC FINDINGS SUGGEST THAT THIS SPECIMEN IS NOT REPRESENTATIVE OF LOWER RESPIRATORY SECRETIONS. PLEASE RECOLLECT. CALLED RN [facility] AT [phone] MARTINB   Report Status 01/08/2015 FINAL  Final     Studies: No results found.  Scheduled Meds: . antiseptic oral rinse  7 mL Mouth Rinse BID  . aspirin  325 mg Oral Daily  . atorvastatin  40 mg Oral q1800  . calcitRIOL  0.5 mcg Oral Daily  . carvedilol  25 mg Oral BID WC  . cloNIDine  0.2 mg Oral BID  . darbepoetin  (ARANESP) injection - DIALYSIS  100 mcg Intravenous Q Sat-HD  . docusate sodium  100 mg Oral Daily  . [START ON 01/12/2015] doxycycline  100 mg Oral Q12H  . feeding supplement (RESOURCE BREEZE)  1 Container Oral TID BM  . heparin  5,000 Units Subcutaneous 3 times per day  . ipratropium-albuterol  3 mL Nebulization Q4H  . methylPREDNISolone (SOLU-MEDROL) injection  40 mg Intravenous Q12H  . multivitamin  1 tablet Oral Daily  . pantoprazole  40 mg Oral Daily  . polyethylene glycol  17 g Oral Daily  . rOPINIRole  0.25-0.5 mg Oral QHS  . sevelamer carbonate  4,000 mg Oral TID WC  . sodium chloride  3 mL Intravenous Q12H   Continuous Infusions:   Antibiotics Given (last 72 hours)    Date/Time Action Medication Dose Rate   01/09/15  1050 Given   vancomycin (VANCOCIN) IVPB 750 mg/150 ml premix 750 mg 150 mL/hr   01/09/15 1500 Given   piperacillin-tazobactam (ZOSYN) IVPB 2.25 g 2.25 g 100 mL/hr   01/09/15 2220 Given   piperacillin-tazobactam (ZOSYN) IVPB 2.25 g 2.25 g 100 mL/hr   01/10/15 0814 Given   piperacillin-tazobactam (ZOSYN) IVPB 2.25 g 2.25 g 100 mL/hr   01/10/15 1943 Given   piperacillin-tazobactam (ZOSYN) IVPB 2.25 g 2.25 g 100 mL/hr   01/11/15 0538 Given   piperacillin-tazobactam (ZOSYN) IVPB 2.25 g 2.25 g 100 mL/hr   01/11/15 1707 Given   vancomycin (VANCOCIN) IVPB 750 mg/150 ml premix 750 mg 150 mL/hr   01/11/15 2123 Given   piperacillin-tazobactam (ZOSYN) IVPB 2.25 g 2.25 g 100 mL/hr      Principal Problem:   Foot pain, right Active Problems:   HLD (hyperlipidemia)   Polycystic kidney   Lower GI bleed   ESRD (end stage renal disease) on dialysis   Foot pain   CHF (congestive heart failure)   CAD (coronary artery disease)   Hyperkalemia   Renal mass    Time spent: 35 min   Barton Dubois  [facility] Pager [phone] If 7PM-7AM, please contact night-coverage at [email or web address], password [facility] 01/11/2015, 11:32 PM  LOS: 12 days

## 2015-01-12 DIAGNOSIS — R5381 Other malaise: Secondary | ICD-10-CM

## 2015-01-12 DIAGNOSIS — J438 Other emphysema: Secondary | ICD-10-CM

## 2015-01-12 DIAGNOSIS — L03119 Cellulitis of unspecified part of limb: Secondary | ICD-10-CM

## 2015-01-12 DIAGNOSIS — N2889 Other specified disorders of kidney and ureter: Secondary | ICD-10-CM | POA: Insufficient documentation

## 2015-01-12 DIAGNOSIS — Z89519 Acquired absence of unspecified leg below knee: Secondary | ICD-10-CM | POA: Insufficient documentation

## 2015-01-12 DIAGNOSIS — D62 Acute posthemorrhagic anemia: Secondary | ICD-10-CM

## 2015-01-12 DIAGNOSIS — L02619 Cutaneous abscess of unspecified foot: Secondary | ICD-10-CM

## 2015-01-12 MED ORDER — DOXYCYCLINE HYCLATE 100 MG PO TABS: 100.0000 mg | ORAL_TABLET | Freq: Two times a day (BID) | ORAL | Status: DC

## 2015-01-12 MED ORDER — MORPHINE SULFATE 2 MG/ML IJ SOLN
INTRAMUSCULAR | Status: AC
  Administered 2015-01-12: 1 mg via INTRAVENOUS
  Filled 2015-01-12: qty 1

## 2015-01-12 MED ORDER — OXYCODONE-ACETAMINOPHEN 5-325 MG PO TABS
ORAL_TABLET | ORAL | Status: AC
  Filled 2015-01-12: qty 1

## 2015-01-12 MED ORDER — OXYCODONE-ACETAMINOPHEN 5-325 MG PO TABS: 1.0000 | ORAL_TABLET | Freq: Four times a day (QID) | ORAL | Status: DC | PRN

## 2015-01-12 MED ORDER — ATORVASTATIN CALCIUM 40 MG PO TABS
40.0000 mg | ORAL_TABLET | Freq: Every day | ORAL | Status: DC
Start: 2015-01-12 — End: 2015-01-13

## 2015-01-12 MED ORDER — PREDNISONE 20 MG PO TABS
ORAL_TABLET | ORAL | Status: DC
Start: 2015-01-12 — End: 2015-01-13

## 2015-01-12 MED ORDER — LABETALOL HCL 100 MG PO TABS: 100.0000 mg | ORAL_TABLET | Freq: Two times a day (BID) | ORAL | Status: DC

## 2015-01-12 MED ORDER — CARVEDILOL 25 MG PO TABS: 25.0000 mg | ORAL_TABLET | Freq: Two times a day (BID) | ORAL | Status: DC

## 2015-01-12 NOTE — Progress Notes (Signed)
Occupational Therapy Treatment Patient Details Name: Randy [name] MRN: [id number] DOB: 07/23/1932 Today's Date: 01/12/2015    History of present illness Patient is an 81 yo male admitted 12/30/14 with Rt foot pain and cellulitis.  Patient had recent fall and injured Rt foot.  Following vascular surgery work-up for peripheral vascular disease, pt is now s/p R BKA.  PMH:  HLD, HTN, ESRD now on HD, CHF, CAD, cardiomyopathy, NSTEMI   OT comments  Pt making progress toward set ADL goals.  Pt did not get accepted into rehab and is now refusing to go to SNF so will be going straight home with [place] with new R BKA.  Educated family that was available on transfers and concerns for home.  Reviewed at length the safe way to go about doing adls at home since his wife is not in the best health to assist pt.  Therapist unsure if family is understanding the seriousness of this surgery and how things will change at home.  Therefore rec HHOT and [place].  Wife not available for training but other family members were available.  Follow Up Recommendations  Home health OT;Supervision/Assistance - 24 hour    Equipment Recommendations  3 in 1 bedside comode    Recommendations for Other Services      Precautions / Restrictions Precautions Precautions: Fall Precaution Comments: R BKA Required Braces or Orthoses: Other Brace/Splint Other Brace/Splint: rigid residual limb knee immoblilzer Restrictions Weight Bearing Restrictions: No       Mobility Bed Mobility Overal bed mobility: Needs Assistance       Supine to sit: Min guard     General bed mobility comments: moving better in bed today.  Cues for technique and safety.  Transfers Overall transfer level: Needs assistance Equipment used: Rolling walker (2 wheeled) Transfers: Sit to/from [facility] Sit to Stand: Min assist Stand pivot transfers: Min assist       General transfer comment: Cues for hand placement and safety.      Balance Overall balance assessment: Needs assistance Sitting-balance support: Bilateral upper extremity supported Sitting balance-Leahy Scale: Fair     Standing balance support: Bilateral upper extremity supported;During functional activity Standing balance-Leahy Scale: Zero Standing balance comment: Pt must have walker or outside support to stand with new BKA                   ADL Overall ADL's : Needs assistance/impaired Eating/Feeding: Independent;Sitting   Grooming: Oral care;Wash/dry face;Min guard;Standing Grooming Details (indicate cue type and reason): min guard at sink.  Pt had to sit one time for a rest break in middle of grooming at the sink. Upper Body Bathing: Set up;Sitting   Lower Body Bathing: Minimal assistance;Sit to/from stand Lower Body Bathing Details (indicate cue type and reason): talked to pt about bathing set up at home.  Pt has walk in shower but instructed to do sponge bathes until he is told it is ok to shower.  Pt can sponge bathe in bathroom with 3:1 in front of sink.  If wife is only one home, rec he sponge bathe in bed b/c he can roll side to side to bathe or lean side to side on bed so he does not have to stand.   Upper Body Dressing : Set up;Sitting   Lower Body Dressing: Sit to/from stand;Minimal assistance Lower Body Dressing Details (indicate cue type and reason): Pt requires min assist to stand and manage fasteners etc on pants.  REc to pt to wear  elastic waist band pants so he does not have to mess with belts and buckles.  Pt could also, for safety reasons, dress in the bed and to avoid standing if wife not home to supervise and assist pt with LE dressing. Toilet Transfer: Minimal Production assistant, radio Details (indicate cue type and reason): Pt required cues for hand placement while using walker.  Spoke with him at length about not pushing off of the walker bc he could tip the walker.  Pt has a 3:1 coming for home.  Rec he  use this or a urinal at night so he won't try to walk into the bathroom alone at night. Toileting- Clothing Manipulation and Hygiene: Minimal assistance;Sit to/from stand Toileting - Clothing Manipulation Details (indicate cue type and reason): Min assist needed to manage balance in standing while managing clothes. Rec pt sit down to fasten belt etc or wear pants with elastic waistband.  Pt states he likes to stand to manage his pants and unsure he  will take the safer route for dressing.     Functional mobility during ADLs: Minimal assistance;Rolling walker;Cueing for safety General ADL Comments: Pt doing well overall with adls requiring min assist.  Suggesting pt do most adls it sitting since he is going home so soon after surgery and wife not in the best health to assist pt.  Feel pt would benefit from SNF rehab but is refusing.  Will rec aid as well to assist with adls.      Vision                     Perception     Praxis      Cognition   Behavior During Therapy: WFL for tasks assessed/performed Overall Cognitive Status: Within Functional Limits for tasks assessed                       Extremity/Trunk Assessment               Exercises     Shoulder Instructions       General Comments      Pertinent Vitals/ Pain       Pain Assessment: 0-10 Pain Score: 6  Pain Location: RLE Pain Descriptors / Indicators: Aching Pain Intervention(s): Limited activity within patient's tolerance;Repositioned;Monitored during session  Home Living                                          Prior Functioning/Environment              Frequency Min 3X/week     Progress Toward Goals  OT Goals(current goals can now be found in the care plan section)  Progress towards OT goals: Progressing toward goals  Acute Rehab OT Goals Patient Stated Goal: to get back home OT Goal Formulation: With patient Time For Goal Achievement: 01/24/15 Potential to  Achieve Goals: Good ADL Goals Pt Will Perform Grooming: with supervision;sitting Pt Will Perform Upper Body Bathing: with supervision;sitting Pt Will Perform Lower Body Bathing: with modified independence;sit to/from stand Pt Will Perform Upper Body Dressing: Independently;sitting Pt Will Perform Lower Body Dressing: with modified independence;sit to/from stand Pt Will Transfer to Toilet: with max assist;with transfer board Pt Will Perform Toileting - Clothing Manipulation and hygiene: with modified independence;sit to/from stand Pt/caregiver will Perform Home Exercise Program: Increased strength;Both right and left upper extremity;With Supervision;With  written HEP provided  Plan Discharge plan needs to be updated    Co-evaluation                 End of Session Equipment Utilized During Treatment: Rolling walker   Activity Tolerance Patient tolerated treatment well   Patient Left in chair;with call bell/phone within reach;with chair alarm set;with family/visitor present   Nurse Communication Mobility status;Precautions        Time: 1150-1220 OT Time Calculation (min): 30 min  Charges: OT General Charges $OT Visit: 1 Procedure OT Treatments $Self Care/Home Management : 23-37 mins  Glenford Peers 01/12/2015, 12:37 PM  [phone]

## 2015-01-12 NOTE — Progress Notes (Signed)
Physical Therapy Treatment Patient Details Name: Randy [name] MRN: [id number] DOB: 03/12/1932 Today's Date: 01/12/2015    History of Present Illness Patient is an 81 yo male admitted 12/30/14 with Rt foot pain and cellulitis.  Patient had recent fall and injured Rt foot.  Following vascular surgery work-up for peripheral vascular disease, pt is now s/p R BKA.  PMH:  HLD, HTN, ESRD now on HD, CHF, CAD, cardiomyopathy, NSTEMI    PT Comments    Patient making slow progress with mobility.  Only able to ambulate 20' with RW today. Feel patient needs continued inpatient therapy at discharge.  CIR unable to accept patient due to insurance declining, therefore recommend ST-SNF to achieve supervision level prior to return home.   Per chart, patient declining SNF.  If does not go to SNF, will need HHPT and will need to use w/c for safety.  Will need assist to get up steps in w/c.   Follow Up Recommendations  SNF;Supervision/Assistance - 24 hour  (If declines SNF, will need HHPT)     Equipment Recommendations  Wheelchair (measurements PT);Wheelchair cushion (measurements PT)    Recommendations for Other Services       Precautions / Restrictions Precautions Precautions: Fall Precaution Comments: R BKA Required Braces or Orthoses: Other Brace/Splint Other Brace/Splint: rigid residual limb knee immoblilzer Restrictions Weight Bearing Restrictions: No    Mobility  Bed Mobility Overal bed mobility: Needs Assistance Bed Mobility: Supine to Sit;Sit to Supine     Supine to sit: Supervision Sit to supine: Supervision   General bed mobility comments: Supervision for safety.  Patient with use of rail.  Transfers Overall transfer level: Needs assistance Equipment used: Rolling walker (2 wheeled) Transfers: Sit to/from [facility] Sit to Stand: Min assist Stand pivot transfers: Min assist       General transfer comment: Verbal cues for hand placement and to scoot to EOB.   Assist to rise to standing.  Assist for balance/safety. Patient able to perform stand-pivot transfers [facility] to chair and chair to bed with min assist.  Ambulation/Gait Ambulation/Gait assistance: Min assist Ambulation Distance (Feet): 20 Feet Assistive device: Rolling walker (2 wheeled) Gait Pattern/deviations:  (Hop-to) Gait velocity: Decreased Gait velocity interpretation: Below normal speed for age/gender General Gait Details: Verbal cues to try to use UE's more on RW.  Patient hopping on LLE.  At 20', patient fatigued and leaning forward on RW.  Encouraged patient to stand upright and provided chair to sit.     Stairs            Wheelchair Mobility    Modified Rankin (Stroke Patients Only)       Balance           Standing balance support: Bilateral upper extremity supported Standing balance-Leahy Scale: Poor Standing balance comment: Depends on UE's on RW to maintain balance with min assist.                    Cognition Arousal/Alertness: Awake/alert Behavior During Therapy: WFL for tasks assessed/performed Overall Cognitive Status: Within Functional Limits for tasks assessed                      Exercises      General Comments        Pertinent Vitals/Pain Pain Assessment: 0-10 Pain Score: 5  Pain Location: Rt LE Pain Descriptors / Indicators: Aching;Sore Pain Intervention(s): Limited activity within patient's tolerance;Repositioned    Home Living  Prior Function            PT Goals (current goals can now be found in the care plan section) Acute Rehab PT Goals Patient Stated Goal: to get back home Progress towards PT goals: Progressing toward goals    Frequency  Min 3X/week    PT Plan Discharge plan needs to be updated    Co-evaluation             End of Session Equipment Utilized During Treatment: Gait belt Activity Tolerance: Patient limited by fatigue Patient left: in bed;with call  bell/phone within reach;with nursing/sitter in room (patient going to HD)     Time: 6629-4765 PT Time Calculation (min) (ACUTE ONLY): 24 min  Charges:  $Gait Training: 23-37 mins                    G Codes:      Despina Pole 2015/01/28, 5:52 PM Carita Pian. Sanjuana Kava, [place] Pager [phone]

## 2015-01-12 NOTE — Clinical Social Work Note (Signed)
Faye Ramsay, LCSW Social Worker Signed Clinical Social Work Clinical Social Work Note 01/11/2015 6:03 PM    Expand All Collapse All   CSW Intern talked with patient's wife regarding SNF placement and bed offers given. Mrs. Toner asked that patient be consulted on what he wants to do. Patient in dialysis as of 6:07 pm. CSW will f/u with patient on 4/21.  Aryella Besecker Givens, MSW, LCSW Licensed Clinical Social Worker [place] [phone]

## 2015-01-12 NOTE — Progress Notes (Signed)
Noted is expiration of patient's IV, notified IV team to place new IV. Will continue to monitor.

## 2015-01-12 NOTE — Discharge Summary (Signed)
Physician Discharge Summary  Randy [name] MVH:[id number] DOB: Feb 07, 1932 DOA: 12/30/2014  PCP: [name], MD  Admit date: 12/30/2014 Discharge date: 01/12/2015  Time spent: >30 minutes  Recommendations for Outpatient Follow-up:  1. Reassess BP and adjust antihypertensive regimen as needed 2. Please make sure patient follow with vascular surgery as instructed 3. Repeat CT in 1 year as recommended to evaluate findings on renal mass  Discharge Diagnoses:  Principal Problem:   Foot pain, right Active Problems:   HLD (hyperlipidemia)   Polycystic kidney   Lower GI bleed   ESRD (end stage renal disease) on dialysis   Foot pain   CHF (congestive heart failure)   CAD (coronary artery disease)   Hyperkalemia   Renal mass Right BKA  Discharge Condition: stable and improved. Discharge home with home health services and follow up with vascular surgery in 4 weeks  Diet recommendation: low sodium diet, renal diet  Filed Weights   01/10/15 0500 01/10/15 1323 01/11/15 1345  Weight: 71.9 kg (158 lb 8.2 oz) 71.5 kg (157 lb 10.1 oz) 74 kg (163 lb 2.3 oz)    History of present illness:  81 y.o. male with hx of HLd, HTN, ESRD on peritoneal dialysis (last dialysis was yesterday), gout, history of pericarditis, history of lower GI bleeding, anemia, congestive heart failure, on home oxygen, CAD, who presents with right foot pain.  Patient reports that he fell 2 weeks ago and injured his right foot over 2nd, 3rd and 4th. He has skin laceration in these toes. He has progressive pain since then. He was placed on Keflex for the last week, but still has severe pain. He came to the ED and had normal lab work, then was sent home yesterday. However his pain has worsened since yesterday. He cut his dialysis short yesterday. He was sent in from PMD for possible cellulitis. He reports that he had one episode of chest discomfort yesterday, which has resolved spontaneously. Currently no any chest  pain.  Patient denies fever, chills, fatigue, headaches, cough, chest pain, SOB, abdominal pain, diarrhea, constipation, dysuria, urgency, frequency, hematuria, skin rashes, joint pain or leg swelling. No unilateral weakness, numbness or tingling sensations. No vision change or hearing loss.  Of note, patient had arterial Seg signal doppler study on 01/06/15, which showed evidence of significant peripheral small vessel tibial disease. ABIs could not be obtained per comment.   In ED, patient was found to have WBC 14.4 (patient is on prednisone tapering), temperature normal, mildly tachycardia, potassium 5.4 without T-wave peaking on EKG. x-ray of right foot is negative for acute bony abnormalities. EKG showed T-wave inversion in inferior leads and V4 to 6, LAE, LVH, all of which are similar to previous EKG on 10/20/14. Patient is admitted to inpatient for further evaluation and treatment.   Hospital Course:  Acute Respiratory failure: secondary to pulmonary edema. Systolic HF exacerbation; and other emphysema exacerbation with bronchiectasis/HCAP -Patient with increase work of breathing after Right BKA; due to fluid resuscitation during surgery most likely. -Breathing has now improved and pretty much back to baseline  -Continue with home inhaler treatments.  -Chest x ray 4-17 showed some infiltrates.  -treated with IV broad spectrum antibiotics and nebulizer/solumedrol -at discharge just mild exp wheezing present; no cough, no fever and with good air movement -Transition to prednisone on 4-20 and oral antibiotic to finish treatment for PNA.  -Leukocytosis probably related to steroids.   Right foot pain: S/P BKA.  - had arterial Seg signal doppler study on  12/27/14, which showed evidence of significant peripheral small vessel tibial disease.  -S/P Right below Knee amputation 4-14. -antibiotics switch to PO doxycycline and plan is to complete 5 days for hx of superimposed cellulitis and to help  with bronchiectasis in patient with COPD -follow up in 4 weeks with Dr. Scot Dock   Acute blood loss anemia; S/P 2 units PRBC dialysis 4-18.  -Hb stable after transfusion   ESRD-HD with hyperkalemia at admission: renal for HD Had Emergent dialysis 4-15. Had dialysis 4-16, 4-18 and 4-20.  Per renal service ok to resume home schedule at discharge  Hypertension: -Continue home medications: Amlodipine, clonidine, hydralazine, Coreg, labetalol -advise to follow low sodium diet -reassess BP as an outpatient and adjust medications as needed   Hyperlipidemia: -continue Lipitor  GERD: -continue PPI  CAD: Currently no chest pain.  -EKG without new changes. -Continue aspirin and Lipitor  Kidney mass: incidental finding on CT scan was a 2 cm enhancing mass in the midpole of the left kidney concerning for possible neoplasm or malignancy.  -Recommendations: Follow up CT without and with contrast in 1 year suggested to assess the stability of this lesion.  Deconditioning: will need SNF vs CIR; insurance has denied CIR -Per patient/family decision will discharge home with home health services  Procedures:  Right BKA 01/05/15  Consultations:  Renal service  Vascular service   Discharge Exam: Filed Vitals:   01/12/15 0859  BP: 149/79  Pulse: 79  Temp: 98.8 F (37.1 C)  Resp:     General: Speaking full sentences. Alert, awake and oriented X3. Afebrile. Complaining of pain on right leg 4-5/10; no CP or SOB.  Cardiovascular: regular rate, positive SEM, no rubs or gallops  Respiratory: Bilateral Rhonchi, slight exp wheezing; improved air movement   Abdomen: +BS, soft, nt, ND  Musculoskeletal: dressing in place post Right BKA; Clean, dry and intact.   Discharge Instructions   Discharge Instructions    Diet - low sodium heart healthy    Complete by:  As directed      Discharge instructions    Complete by:  As directed   Follow with vascular surgery as instructed Be  compliant with HD Take medications as prescribed Follow a low sodium/renal diet Please arrange follow up with PCP in 10 days          Current Discharge Medication List    START taking these medications   Details  atorvastatin (LIPITOR) 40 MG tablet Take 1 tablet (40 mg total) by mouth daily at 6 PM. Qty: 30 tablet, Refills: 1    doxycycline (VIBRA-TABS) 100 MG tablet Take 1 tablet (100 mg total) by mouth every 12 (twelve) hours. Qty: 14 tablet, Refills: 0    predniSONE (DELTASONE) 20 MG tablet Take 2 tablets by mouth daily X 2 days; then 1 tablet by mouth daily X 2 days; then 1/2 tablet by mouth daily X 3 days and stop prednisone Qty: 9 tablet, Refills: 0      CONTINUE these medications which have CHANGED   Details  carvedilol (COREG) 25 MG tablet Take 1 tablet (25 mg total) by mouth 2 (two) times daily with a meal.    labetalol (NORMODYNE) 100 MG tablet Take 1 tablet (100 mg total) by mouth 2 (two) times daily. Qty: 60 tablet, Refills: 1    oxyCODONE-acetaminophen (PERCOCET/ROXICET) 5-325 MG per tablet Take 1 tablet by mouth every 6 (six) hours as needed for severe pain. Qty: 20 tablet, Refills: 0      CONTINUE  these medications which have NOT CHANGED   Details  aspirin 325 MG tablet Take 1 tablet (325 mg total) by mouth 2 (two) times daily.    calcitRIOL (ROCALTROL) 0.5 MCG capsule Take 1 capsule (0.5 mcg total) by mouth daily. Qty: 30 capsule, Refills: 3    cloNIDine (CATAPRES) 0.2 MG tablet Take 0.2 mg by mouth 2 (two) times daily as needed (blood pressure >130/27).  Refills: 10    COLCRYS 0.6 MG tablet Take 1 tablet by mouth daily as needed (gout flare up).  Refills: 2    feeding supplement, RESOURCE BREEZE, (RESOURCE BREEZE) LIQD Take 1 Container by mouth 3 (three) times daily between meals. Qty: 90 Container, Refills: 5    multivitamin (RENA-VIT) TABS tablet Take 1 tablet by mouth daily.    pantoprazole (PROTONIX) 40 MG tablet Take 1 tablet (40 mg total) by  mouth daily at 12 noon. Qty: 30 tablet, Refills: 11    rOPINIRole (REQUIP) 0.25 MG tablet Take 0.25-0.5 mg by mouth at bedtime as needed (restless legs). 1-2 tablets based on severity of restless legs    sevelamer carbonate (RENVELA) 800 MG tablet Take 1,600-2,400 mg by mouth See admin instructions. Take 3 tablets (2400 mg) with meals and 2 tablets (1600 mg) with snacks    amLODipine (NORVASC) 10 MG tablet Take 1 tablet (10 mg total) by mouth daily. Qty: 30 tablet, Refills: 3    docusate sodium 100 MG CAPS Take 100 mg by mouth 2 (two) times daily. Qty: 10 capsule, Refills: 0    hydrALAZINE (APRESOLINE) 25 MG tablet Take 1 tablet (25 mg total) by mouth 3 (three) times daily. Qty: 90 tablet, Refills: 3    polyethylene glycol (MIRALAX / GLYCOLAX) packet Take 17 g by mouth daily. Qty: 30 each, Refills: 0      STOP taking these medications     cephALEXin (KEFLEX) 250 MG capsule      lisinopril (PRINIVIL,ZESTRIL) 10 MG tablet      predniSONE (STERAPRED UNI-PAK) 10 MG tablet      senna-docusate (SENOKOT-S) 8.6-50 MG per tablet      simvastatin (ZOCOR) 40 MG tablet      simvastatin (ZOCOR) 80 MG tablet        No Known Allergies Follow-up Information    Follow up with DICKSON,CHRISTOPHER S, MD In 4 weeks.   Specialty:  Vascular Surgery   Why:  Our office will call you to arrange an appointment (sent)   Contact information:   [address] [place] [address] [phone]       Follow up with [name], MD. Schedule an appointment as soon as possible for a visit in 10 days.   Specialty:  Internal Medicine   Contact information:   [place] [address] [phone]       The results of significant diagnostics from this hospitalization (including imaging, microbiology, ancillary and laboratory) are listed below for reference.    Significant Diagnostic Studies: Mr Cervical Spine Wo Contrast  12/14/2014   CLINICAL DATA:  Posterior neck pain  radiating into both shoulders. Pain present for 1 week.  EXAM: MRI CERVICAL SPINE WITHOUT CONTRAST  TECHNIQUE: Multiplanar, multisequence MR imaging of the cervical spine was performed. No intravenous contrast was administered.  COMPARISON:  None.  FINDINGS: Image quality is mildly to moderately degraded by motion artifact and by limited patient positioning which prevented the use of the anterior neck coil.  There is reversal of the normal cervical lordosis. There is slight  retrolisthesis C5 on C6 and C6 on C7. There is severe disc space narrowing at C5-6 and C6-7. There is mild to moderate endplate edema at Q4-6 and C6-7, and there is a small amount of T2/STIR hyperintensity in the central to anterior aspect of the C5-6 disc on the right. There is very mild prevertebral edema. There is also marrow edema about the left C3-4 facet joint.  Vertebral body heights are preserved. Degenerative changes are noted about the dens. Cervical spinal cord is normal in caliber without definite signal abnormality identified allowing for artifact. A small amount of dependent signal partially visualized in the medial left lung apex may represent atelectasis or possibly pleural fluid.  C2-3: Left facet arthrosis and ankylosis. No evidence of significant stenosis.  C3-4: Disc bulging and asymmetric left uncovertebral and left facet arthrosis result in likely moderate left neural foraminal stenosis. There is partial effacement of the ventral thecal sac with minimal impression on the spinal cord but no spinal stenosis.  C4-5: Broad-based posterior disc osteophyte complex and asymmetric left facet arthrosis without significant stenosis.  C5-6: Broad-based posterior disc osteophyte complex results in moderate spinal stenosis with mild impression on the ventral spinal cord and moderate right and severe left neural foraminal stenosis.  C6-7: Broad-based posterior disc osteophyte complex results in moderate spinal stenosis in mild-to-moderate  bilateral neural foraminal stenosis.  C7-T1: Small right central disc protrusion and mild right facet arthrosis without stenosis.  IMPRESSION: 1. Mildly to moderately degraded examination as above. 2. Advanced disc degeneration at C5-6 and C6-7 resulting in moderate spinal stenosis. Moderate right and severe left neural foraminal stenosis at C5-6. 3. Mild-to-moderate marrow edema about the C5-6 and C6 discs. While this may be degenerative, given the small amount of signal abnormality in the C5-6 disc space itself and the mild surrounding prevertebral edema, infectious discitis/osteomyelitis would be a concern in the appropriate clinical setting. 4. Moderate disc and facet degeneration elsewhere in the cervical spine as above. Edema about the left C3-4 facet joint is favored to be degenerative, although infection is not completely excluded. These results will be called to the ordering clinician or representative by the Radiologist Assistant, and communication documented in the PACS or zVision Dashboard.   Electronically Signed   By: Logan Bores   On: 12/14/2014 19:45   Mr Abdomen Wo Contrast  01/02/2015   CLINICAL DATA:  Enhancing left renal mass on CT angiography. Hemodialysis patient. Initial encounter.  EXAM: MRI ABDOMEN WITHOUT CONTRAST  TECHNIQUE: Multiplanar multisequence MR imaging was performed without the administration of intravenous contrast.  COMPARISON:  Abdominal CTA 12/31/2014.  FINDINGS: Study is mildly motion degraded. No contrast was administered secondary to renal insufficiency.  Lower chest: Left pleural effusion and left lower lobe atelectasis are again noted. The heart is mildly enlarged.  Hepatobiliary: Decreased T2 signal throughout the liver is likely secondary to iron overload from repeated transfusions. No focal lesions identified. The gallbladder is contracted with mild wall thickening. No evidence of gallstones or biliary dilatation.  Pancreas: Unremarkable. No pancreatic ductal  dilatation or surrounding inflammatory changes.  Spleen: Normal in size without focal abnormality.  Adrenals/Urinary Tract: Small right adrenal adenoma demonstrates loss of signal on the opposed phase gradient echo images for the left adrenal gland appears normal.Both kidneys are atrophied with cortical thinning. There are multiple renal cysts bilaterally. The largest cyst is in the interpolar region of the right kidney, measuring 4.4 cm transverse. The lesion of concern on CT in the anterior interpolar region of the  left kidney is best seen on the coronal images and demonstrates mildly decreased T2 and increased T1 signal. It measures approximately 1.5 cm. There is another complex lesion in the upper pole of the left kidney which also demonstrates increased T1 signal, best seen on the coronal images.  Stomach/Bowel: No evidence of bowel wall thickening, distention or surrounding inflammatory change.  Vascular/Lymphatic: There are no enlarged abdominal lymph nodes. Diffuse atherosclerosis, better seen on CTA.  Other: None.  Musculoskeletal: No acute or significant osseous findings.  IMPRESSION: 1. The lesion of concern in the anterior interpolar region of the left kidney is indeterminate on this noncontrast study, demonstrating mildly decreased T2 signal and mildly increased T1 signal. This could reflect a solid mass or complex cyst. Of note, the recent CTA did not include any precontrast images such that enhancement this lesion is not confirmed. Ultrasound is unlikely to resolve this finding. Follow up CT without and with contrast in 1 year suggested to assess the stability of this lesion. 2. There are additional simple and mildly complex renal cysts bilaterally. 3. Stable small right adrenal adenoma.   Electronically Signed   By: Richardean Sale M.D.   On: 01/02/2015 08:33   Ct Angio Ao+bifem W/cm &/or Wo/cm  12/31/2014   CLINICAL DATA:  Gangrene left foot.  EXAM: CT ANGIOGRAPHY OF ABDOMINAL AORTA WITH  ILIOFEMORAL RUNOFF  TECHNIQUE: Multidetector CT imaging of the abdomen, pelvis and lower extremities was performed using the standard protocol during bolus administration of intravenous contrast. Multiplanar CT image reconstructions and MIPs were obtained to evaluate the vascular anatomy.  CONTRAST:  161mL OMNIPAQUE IOHEXOL 350 MG/ML SOLN  COMPARISON:  None.  FINDINGS: Aorta: Atherosclerotic calcifications are seen involving the abdominal aorta and iliac arteries. No aneurysm or dissection is noted. Left gastric artery has separate origin and is widely patent. Remaining celiac artery is widely patent. Mild calcified stenosis is noted at the origin of the superior mesenteric artery. Severe stenosis is noted at the origin of the inferior mesenteric artery. Heavily calcified stenoses are noted at the origins of both renal arteries. No significant stenoses are noted in the abdominal aorta or iliac arteries.  Right Lower Extremity: Right common and profunda femoral arteries are heavily calcified but patent. However, there is complete occlusion of the proximal right superficial femoral artery with collateral reconstitution of the popliteal artery which is also heavily calcified with multiple stenoses. All 3 trifurcation vessels are heavily calcified. Only the peroneal artery demonstrates definite contrast enhancement. Presumably, the anterior and posterior tibial arteries are occluded.  Left Lower Extremity: Left common and profunda femoral arteries are heavily calcified but patent. There is complete occlusion of the proximal left superficial femoral artery. Collaterals are seen leading to reconstitution of the distal left superficial femoral artery popliteal artery, both of which are heavily calcified with multiple stenoses. The trifurcation vessels are heavily calcified as well with only definite opacification seen within the peroneal branch. Presumably, the anterior and posterior tibial arteries are occluded.  Mild  left pleural effusion is noted with adjacent subsegmental atelectasis. 2 cm rounded enhancing mass is noted in the midpole of the left kidney concerning for possible neoplasm or malignancy. There is no evidence of bowel obstruction. Peritoneal dialysis catheter is noted in the pelvis. Expected amount of free fluid is noted.  Review of the MIP images confirms the above findings.  IMPRESSION: Severe diffuse atherosclerotic calcifications are seen throughout all arteries in the abdomen, pelvis and lower extremities. There is no evidence of abdominal aortic  aneurysm or dissection.  Probable severe calcified stenoses are seen involving both renal arteries.  Both superficial femoral arteries are occluded proximally, with reconstitution of the more distal portions and popliteal arteries through collaterals. There appears to be 1 vessel runoff in each calf through patent peroneal arteries.  2 cm enhancing mass is noted in the mid pole of left kidney concerning for possible neoplasm or malignancy. Further evaluation with MRI is recommended.   Electronically Signed   By: Marijo Conception, M.D.   On: 12/31/2014 14:52   US Arterial Seg Single  12/27/2014   CLINICAL DATA:  Right greater than left lower extremity pain concern for rest pain. Cool extremities. Open wounds of the second, third and fourth toes. End-stage renal disease.  EXAM: NONINVASIVE PHYSIOLOGIC VASCULAR STUDY OF BILATERAL LOWER EXTREMITIES  TECHNIQUE: Evaluation of both lower extremities were performed at rest, including calculation of ankle-brachial indices with single level Doppler, pressure and pulse volume recording.  COMPARISON:  None.  FINDINGS: Segmental pressures could not be obtained at the ankles on either side. Additionally, no Doppler signal could be detected in the ankle regions. Therefore ABIs cannot be obtained.  Right ABI:  Not obtainable  Left ABI:  Not obtainable  IMPRESSION: Evidence of significant peripheral small vessel tibial disease. ABIs  cannot be obtained.   Electronically Signed   By: Jerilynn Mages.  Shick M.D.   On: 12/27/2014 16:48   Dg Chest Port 1 View  01/08/2015   CLINICAL DATA:  Dyspnea.  Pneumonia.  EXAM: PORTABLE CHEST - 1 VIEW  COMPARISON:  01/06/2015  FINDINGS: The cardiac silhouette, mediastinal and hilar contours are stable. There is a persistent left lower lobe process likely a combination of infiltrate or atelectasis and effusion. Overall, the lungs are better aerated with resolution of interstitial pulmonary edema.  IMPRESSION: Improved aeration with resolution of interstitial edema.  Persistent left lower lobe process.   Electronically Signed   By: Marijo Sanes M.D.   On: 01/08/2015 08:45   Dg Chest Port 1 View  01/06/2015   CLINICAL DATA:  Hypertension, dialysis  EXAM: PORTABLE CHEST - 1 VIEW  COMPARISON:  Portable exam 1130 hr compared to 10/12/2014  FINDINGS: Enlargement of cardiac silhouette with pulmonary vascular congestion.  Mild perihilar and basilar infiltrates question pulmonary edema.  LEFT pleural effusion.  No pneumothorax.  Atherosclerotic calcification aorta.  No acute osseous findings.  IMPRESSION: Enlargement of cardiac silhouette with pulmonary vascular congestion and suspect mild pulmonary edema.  LEFT pleural effusion   Electronically Signed   By: Lavonia Dana M.D.   On: 01/06/2015 11:43   Dg Foot Complete Right  12/30/2014   CLINICAL DATA:  Fall from bed 3 weeks ago. Trauma to right foot, hitting a a on a dresser/night standard. Increased swelling and throbbing of the right foot since then. The patient states the foot has been getting colder and feeling numb. He denies diabetes.  EXAM: RIGHT FOOT COMPLETE - 3+ VIEW  COMPARISON:  Right foot radiographs 12/26/2014.  FINDINGS: Mild osteopenia is again noted. No acute bone or soft tissue abnormality is present. Microvascular calcifications are present.  IMPRESSION: 1. No acute abnormality or significant interval change. 2. Mild osteopenia.   Electronically Signed    By: San Morelle M.D.   On: 12/30/2014 19:04   Dg Foot Complete Right  12/26/2014   CLINICAL DATA:  Chronic right foot pain progressive over the past 2 weeks following a fall from bed no definite acute injury  EXAM: RIGHT FOOT COMPLETE -  3+ VIEW  COMPARISON:  Right ankle dated July 10, 2012  FINDINGS: The bones are mildly osteopenic. The interphalangeal joints exhibit diffuse narrowing. No erosive changes are demonstrated. There is mild narrowing of the first metatarsophalangeal joint. The second through fifth metatarsophalangeal joints are unremarkable. The tarsometatarsal and intertarsal joints are normal. There is no acute fracture or dislocation. There are vascular calcifications, and there is mild soft tissue swelling over the midfoot.  IMPRESSION: There is no acute or healing fracture. There is mild osteoarthritic change of the interphalangeal joints. There is mild midfoot soft tissue swelling.   Electronically Signed   By: David  Martinique   On: 12/26/2014 12:33    Microbiology: Recent Results (from the past 240 hour(s))  Surgical pcr screen     Status: None   Collection Time: 01/04/15 10:54 AM  Result Value Ref Range Status   MRSA, PCR NEGATIVE NEGATIVE Final   Staphylococcus aureus NEGATIVE NEGATIVE Final    Comment:        The Xpert SA Assay (FDA approved for NASAL specimens in patients over 10 years of age), is one component of a comprehensive surveillance program.  Test performance has been validated by [facility] for patients greater than or equal to 10 year old. It is not intended to diagnose infection nor to guide or monitor treatment.   Culture, expectorated sputum-assessment     Status: None   Collection Time: 01/08/15 12:56 PM  Result Value Ref Range Status   Specimen Description SPUTUM  Final   Special Requests Normal  Final   Sputum evaluation   Final    MICROSCOPIC FINDINGS SUGGEST THAT THIS SPECIMEN IS NOT REPRESENTATIVE OF LOWER RESPIRATORY SECRETIONS.  PLEASE RECOLLECT. CALLED RN [facility] AT [phone] MARTINB   Report Status 01/08/2015 FINAL  Final     Labs: Basic Metabolic Panel:  Recent Labs Lab 01/06/15 0520 01/07/15 0405 01/09/15 0736 01/11/15 1105  NA 134* 136 132* 133*  K 4.9 4.4 4.8 5.2*  CL 97 96 92* 92*  CO2 23 26 26 22   GLUCOSE 104* 107* 162* 133*  BUN 26* 21 48* 78*  CREATININE 6.04* 4.67* 7.72* 8.20*  CALCIUM 9.1 8.8 8.6 9.0  PHOS  --   --   --  6.1*   Liver Function Tests:  Recent Labs Lab 01/11/15 1105  ALBUMIN 2.3*   CBC:  Recent Labs Lab 01/06/15 0520 01/07/15 0405 01/08/15 0309 01/09/15 0736 01/10/15 1050  WBC 10.3 8.5 7.9 10.7* 12.7*  HGB 8.5* 7.5* 7.2* 7.0* 9.0*  HCT 28.1* 24.5* 23.4* 23.0* 28.9*  MCV 100.4* 98.8 98.3 98.7 95.1  PLT 331 261 265 251 277   BNP (last 3 results)  Recent Labs  01/08/15 1118  BNP >4500.0*    ProBNP (last 3 results)  Recent Labs  06/21/14 1046 08/17/14 0625 08/29/14 0928  PROBNP >70000.0* >70000.0* >70000.0*    Signed:  Barton Dubois  [facility] 01/12/2015, 10:53 AM

## 2015-01-12 NOTE — Progress Notes (Signed)
Pt. States he doesn't wear bipap, only when needed. Pt.states he will notify if he decides to wear one.

## 2015-01-12 NOTE — Progress Notes (Signed)
Patient ID: Randy [name], male   DOB: 09/18/1932, 81 y.o.   MRN: [id number]  [facility] Progress Note    Subjective:   Feels well    Objective:   BP 162/100 mmHg  Pulse 79  Temp(Src) 98.8 F (37.1 C) (Oral)  Resp 17  Ht 5\' 9"  (1.753 m)  Wt 74 kg (163 lb 2.3 oz)  BMI 24.08 kg/m2  SpO2 97%  Intake/Output: I/O last 3 completed shifts: In: 640 [P.O.:640] Out: 3500 [Other:3500]   Intake/Output this shift:    Weight change: 2.5 kg (5 lb 8.2 oz)  Physical Exam: Gen: WD WN AAM in NAD CVS:no rub Resp:cta FWY:OVZCHY Ext:s/p R BKA, LAVF +T/B, hemtoma in antecubital area  Labs: BMET  Recent Labs Lab 01/06/15 0520 01/07/15 0405 01/09/15 0736 01/11/15 1105  NA 134* 136 132* 133*  K 4.9 4.4 4.8 5.2*  CL 97 96 92* 92*  CO2 23 26 26 22   GLUCOSE 104* 107* 162* 133*  BUN 26* 21 48* 78*  CREATININE 6.04* 4.67* 7.72* 8.20*  ALBUMIN  --   --   --  2.3*  CALCIUM 9.1 8.8 8.6 9.0  PHOS  --   --   --  6.1*   CBC  Recent Labs Lab 01/07/15 0405 01/08/15 0309 01/09/15 0736 01/10/15 1050  WBC 8.5 7.9 10.7* 12.7*  HGB 7.5* 7.2* 7.0* 9.0*  HCT 24.5* 23.4* 23.0* 28.9*  MCV 98.8 98.3 98.7 95.1  PLT 261 265 251 277    @IMGRELPRIORS @ Medications:    . antiseptic oral rinse  7 mL Mouth Rinse BID  . aspirin  325 mg Oral Daily  . atorvastatin  40 mg Oral q1800  . calcitRIOL  0.5 mcg Oral Daily  . carvedilol  25 mg Oral BID WC  . cloNIDine  0.2 mg Oral BID  . darbepoetin (ARANESP) injection - DIALYSIS  100 mcg Intravenous Q Sat-HD  . docusate sodium  100 mg Oral Daily  . doxycycline  100 mg Oral Q12H  . feeding supplement (RESOURCE BREEZE)  1 Container Oral TID BM  . heparin  5,000 Units Subcutaneous 3 times per day  . ipratropium-albuterol  3 mL Nebulization Q4H  . multivitamin  1 tablet Oral Daily  . pantoprazole  40 mg Oral Daily  . polyethylene glycol  17 g Oral Daily  . predniSONE  50 mg Oral Q breakfast  . rOPINIRole  0.25-0.5 mg Oral QHS  .  sevelamer carbonate  4,000 mg Oral TID WC  . sodium chloride  3 mL Intravenous Q12H     Assessment/ Plan:   1. PVD- s/p right BKA 2. ESRD- normally TTS, off schedule due to pulmonary edema following surgery. Now with infiltration and hematoma over avf.  1. Will get back on schedule today. 3. Pulmonary edema-improved with extra HD 4. Anemia: cont with Aranesp and transfuse prn (received 2 units with HD yesterday) 5. CKD-MBD: continue with binders and vitamin D 6. Nutrition:per primary 7. Vascular access- L AVF with infiltration, improving. Also has PD catheter which will need to be removed as he has failed PD as an outpt (placed by someone in [place]). 8. Hypertension:stable 9. Nodule in right kidney 10. Dispo- possible discharge today after HD or tomorrow morning.  Randy [name] A 01/12/2015, 11:49 AM

## 2015-01-12 NOTE — Progress Notes (Signed)
  [facility] Progress Note  01/12/2015 8:27 AM 7 Days Post-Op  Subjective:  Eating breakfast. Wants to go home.   Filed Vitals:   01/12/15 0759  BP: 149/84  Pulse: 84  Temp: 98.3 F (36.8 C)  Resp: 17    Physical Exam: Right below knee amputation site dressed. Will wait after patient finishes breakfast to assess.   CBC    Component Value Date/Time   WBC 12.7* 01/10/2015 1050   RBC 3.04* 01/10/2015 1050   RBC 3.21* 10/08/2012 2241   HGB 9.0* 01/10/2015 1050   HCT 28.9* 01/10/2015 1050   PLT 277 01/10/2015 1050   MCV 95.1 01/10/2015 1050   MCH 29.6 01/10/2015 1050   MCHC 31.1 01/10/2015 1050   RDW 18.6* 01/10/2015 1050   LYMPHSABS 0.7 12/30/2014 1754   MONOABS 0.9 12/30/2014 1754   EOSABS 0.0 12/30/2014 1754   BASOSABS 0.0 12/30/2014 1754    BMET    Component Value Date/Time   NA 133* 01/11/2015 1105   K 5.2* 01/11/2015 1105   CL 92* 01/11/2015 1105   CO2 22 01/11/2015 1105   GLUCOSE 133* 01/11/2015 1105   BUN 78* 01/11/2015 1105   CREATININE 8.20* 01/11/2015 1105   CALCIUM 9.0 01/11/2015 1105   CALCIUM 8.1* 06/24/2012 1634   GFRNONAA 5* 01/11/2015 1105   GFRAA 6* 01/11/2015 1105    INR    Component Value Date/Time   INR 1.03 01/05/2015 0610     Intake/Output Summary (Last 24 hours) at 01/12/15 0827 Last data filed at 01/11/15 1754  Gross per 24 hour  Intake    240 ml  Output   3500 ml  Net  -3260 ml     Assessment:  81 y.o. male is s/p: right below knee amputation   7 Days Post-Op  Plan: -Ok from vascular standpoint to d/c.  -Dispo: SNF vs home. Insurance denied CIR Will arrange 4 week follow up appointment with Dr. Scot Dock.    Virgina Jock, PA-C [facility] Office: [phone] Pager: [phone] 01/12/2015 8:27 AM

## 2015-01-13 ENCOUNTER — Telehealth: Payer: Self-pay | Admitting: Vascular Surgery

## 2015-01-13 MED ORDER — PREDNISONE 20 MG PO TABS: ORAL_TABLET | ORAL | Status: DC

## 2015-01-13 MED ORDER — OXYCODONE-ACETAMINOPHEN 5-325 MG PO TABS: 1.0000 | ORAL_TABLET | Freq: Four times a day (QID) | ORAL | Status: DC | PRN

## 2015-01-13 MED ORDER — ATORVASTATIN CALCIUM 40 MG PO TABS: 40.0000 mg | ORAL_TABLET | Freq: Every day | ORAL | Status: DC

## 2015-01-13 MED ORDER — DOXYCYCLINE HYCLATE 100 MG PO TABS: 100.0000 mg | ORAL_TABLET | Freq: Two times a day (BID) | ORAL | Status: DC

## 2015-01-13 MED ORDER — LABETALOL HCL 100 MG PO TABS: 100.0000 mg | ORAL_TABLET | Freq: Two times a day (BID) | ORAL | Status: DC

## 2015-01-13 MED ORDER — CARVEDILOL 25 MG PO TABS: 25.0000 mg | ORAL_TABLET | Freq: Two times a day (BID) | ORAL | Status: DC

## 2015-01-13 NOTE — Telephone Encounter (Addendum)
-----   Message from Mena Goes, RN sent at 01/12/2015  9:03 AM EDT ----- Regarding: Schedule   ----- Message -----    From: Alvia Grove, PA-C    Sent: 01/12/2015   8:31 AM      To: Vvs Charge Pool  S/p right BKA 01/05/15  F/u in 4 weeks from surgery date with Dr. Scot Dock.  Thanks Kim  01/13/15: lm for pt re appt, dpm

## 2015-01-13 NOTE — Care Management Note (Signed)
Pt to d/c to home with family . HHPT, [place] and HHRN to be provided by [facility]. Wheelchair and 3:1 commode ordered 01/12/2015 delivered to home and taken home by family.  Urania notified of plan to d//c to home today.  CRoyal RN MPH, case manager, [phone]

## 2015-01-13 NOTE — Progress Notes (Addendum)
[facility] [place] on going home today.   Objective 144/75 84 98.5 F (36.9 C) (Oral) 17 98%  Intake/Output Summary (Last 24 hours) at 01/13/15 0840 Last data filed at 01/12/15 1802  Gross per 24 hour  Intake    443 ml  Output      0 ml  Net    443 ml    Right BKA incision clean and dry, central incision dark spot 1cm. Skin warm to touch  Assessment/Planning: POD # 8 Right BKA  F/U at 4 week mark for staple removal and incisional healing evaluation with Dr. Pryor Montes, [facility] 01/13/2015 8:40 AM --  Laboratory Lab Results:  Recent Labs  01/10/15 1050  WBC 12.7*  HGB 9.0*  HCT 28.9*  PLT 277   BMET  Recent Labs  01/11/15 1105  NA 133*  K 5.2*  CL 92*  CO2 22  GLUCOSE 133*  BUN 78*  CREATININE 8.20*  CALCIUM 9.0    COAG Lab Results  Component Value Date   INR 1.03 01/05/2015   INR 1.12 12/30/2014   INR 1.04 09/24/2014   Agree with above.  [name], MD, [place] [phone] Office: [phone]

## 2015-01-13 NOTE — Progress Notes (Signed)
Patient seen and examined. No complaints, VSS, no fever and no changes needed from discharge instructions or prescriptions from 01/12/15. Ok to discharge from IM stand point.  Barton Dubois [phone]

## 2015-01-13 NOTE — Progress Notes (Signed)
Occupational Therapy Treatment Patient Details Name: Randy [name] MRN: [id number] DOB: 1931/10/10 Today's Date: 01/13/2015    History of present illness Patient is an 81 yo male admitted 12/30/14 with Rt foot pain and cellulitis.  Patient had recent fall and injured Rt foot.  Following vascular surgery work-up for peripheral vascular disease, pt is now s/p R BKA.  PMH:  HLD, HTN, ESRD now on HD, CHF, CAD, cardiomyopathy, NSTEMI   OT comments  Pt making progress with functional goals and should continue with acute OT services to increase level of function and safety. Pt still planning to d/c home with [facility] and declined SNF. pt's niece present during session and provided with training/review of transfers and ADL techniques, DME and A/E   Follow Up Recommendations  Home health OT;Supervision/Assistance - 24 hour    Equipment Recommendations  3 in 1 bedside comode;Tub/shower seat;Other (comment) (ADL A/E kit)    Recommendations for Other Services      Precautions / Restrictions Precautions Precautions: Fall Precaution Comments: R BKA Required Braces or Orthoses: Other Brace/Splint Other Brace/Splint: rigid residual limb knee immoblilzer Restrictions Weight Bearing Restrictions: No       Mobility Bed Mobility Overal bed mobility: Needs Assistance Bed Mobility: Supine to Sit     Supine to sit: Supervision     General bed mobility comments: Supervision for safety.  Patient with use of rail.  Transfers Overall transfer level: Needs assistance Equipment used: Rolling walker (2 wheeled) Transfers: Sit to/from [facility] Sit to Stand: Min assist Stand pivot transfers: Min assist       General transfer comment: Verbal cues for hand placement and to scoot to EOB.  Assist to rise to standing.  Assist for balance/safety. Patient able to perform stand-pivot transfers [facility] to chair and chair to bed with min assist.    Balance   Sitting-balance support: No upper  extremity supported;Feet supported Sitting balance-Leahy Scale: Fair     Standing balance support: Bilateral upper extremity supported;During functional activity Standing balance-Leahy Scale: Poor                     ADL       Grooming: Wash/dry face;Min guard;Standing;Wash/dry hands   Upper Body Bathing: Sitting;Supervision/ safety       Upper Body Dressing : Supervision/safety;Sitting   Lower Body Dressing: Sit to/from stand;Minimal assistance;Sitting/lateral leans   Toilet Transfer: Minimal assistance;Stand-pivot;BSC   Toileting- Clothing Manipulation and Hygiene: Minimal assistance;Sit to/from stand       Functional mobility during ADLs: Minimal assistance;Rolling walker;Cueing for safety General ADL Comments: pt's niece present during session and provided with training/review of transfers and ADL techniques, DME and A/E needs      Vision  no change from baseline                   Perception Perception Perception Tested?: No   Praxis Praxis Praxis tested?: Not tested    Cognition   Behavior During Therapy: [facility] for tasks assessed/performed Overall Cognitive Status: Within Functional Limits for tasks assessed                       Extremity/Trunk Assessment   WFL                        General Comments  pt very pleasant and cooperative    Pertinent Vitals/ Pain       Pain Score: 7  Pain  Location: R LE Pain Descriptors / Indicators: Aching;Sore Pain Intervention(s): Limited activity within patient's tolerance;Repositioned;Monitored during session;Premedicated before session  Home Living  lives at home with wife                                        Prior Functioning/Environment  independent           Frequency Min 2X/week     Progress Toward Goals  OT Goals(current goals can now be found in the care plan section)  Progress towards OT goals: Progressing toward goals     Plan Discharge  plan remains appropriate                     End of Session Equipment Utilized During Treatment: Rolling walker;Gait belt [facility])   Activity Tolerance Patient tolerated treatment well   Patient Left in chair;with call bell/phone within reach;with chair alarm set;with family/visitor present   Nurse Communication          Time: 2119-4174 OT Time Calculation (min): 28 min  Charges: OT General Charges $OT Visit: 1 Procedure OT Treatments $Self Care/Home Management : 8-22 mins $Therapeutic Activity: 8-22 mins  Britt Bottom 01/13/2015, 1:05 PM

## 2015-01-13 NOTE — Op Note (Signed)
PATIENT NAME:  Randy [name], HYER MR#:  [id number] DATE OF BIRTH:  01/17/1932  DATE OF PROCEDURE:  02/24/2013  PREOPERATIVE DIAGNOSIS:  End-stage renal disease requiring hemodialysis.   POSTOPERATIVE DIAGNOSIS:  End-stage renal disease requiring hemodialysis.    PROCEDURE PERFORMED: Laparoscopic-assisted insertion of peritoneal dialysis catheter.   SURGEON: Katha Cabal, M.D.   ANESTHESIA: General by endotracheal intubation.   FLUIDS: Per anesthesia record.   ESTIMATED BLOOD LOSS: Minimal.   SPECIMEN: None.   INDICATIONS: Mr. Shadwick is a 81 year old gentleman who is now requiring dialysis and has elected to pursue peritoneal. The risks and benefits were reviewed. All questions answered. The patient is agreeable to insertion of catheter.   DESCRIPTION OF PROCEDURE: The patient is taken to the operating room and placed in the supine position after adequate general anesthesia is induced and appropriate invasive monitors are placed, and he is positioned supine.  His abdomen is prepped and draped in a sterile fashion.   A curvilinear incision made in the infraumbilical margin and carried down at down to expose the fascia which was hooked with a bone hook. A Veress needle is introduced and saline test is used to verify intraperitoneal placement and low flow insufflation of CO2 was performed, subsequently high flow and the abdomen is filled to 15 mmHg. An 11 mm trocar is introduced. The scope was then introduced and the viscera are inspected. No significant adhesions or other abnormalities are visualized.   Small incision is made in the left lower quadrant carried down to expose the fascia. A pursestring suture of 0 Vicryl is placed. Subsequently, a Seldinger needle was inserted under video laparoscopic visualization, J-wire followed by a dilator and peel-away sheath. The peritoneal catheter is then advanced into the abdomen over a trocar and positioned under direct laparoscopic guidance in the  left lower quadrant.   CO2 is turned off and the abdomen is allowed to collapse down under laparoscopic visualization. Subsequently, the trocar and scope were removed. The fascia of the infraumbilical incision is closed with a loop 0 Vicryl. The pursestring suture is secured around the cuff, which is placed subfascially in the left lower quadrant. The catheter is then tunneled to exit superiorly. Hub assembly is connected without difficulty.   Subcutaneous tissues of both incisions were closed with 3-0 Vicryl. The skin is closed with 4-0 Monocryl subcuticular and Dermabond is applied. The patient tolerated the procedure well and there were no immediate complications. Sponge and needle counts were correct and he was taken to the recovery area in excellent condition.      ____________________________ Katha Cabal, MD ggs:cc D: 02/24/2013 15:05:55 ET T: 02/24/2013 18:55:40 ET JOB#: [id number]  cc: Katha Cabal, MD, <Dictator> Katha Cabal MD ELECTRONICALLY SIGNED 03/10/2013 16:07

## 2015-01-22 ENCOUNTER — Encounter (HOSPITAL_COMMUNITY): Payer: Self-pay

## 2015-01-22 ENCOUNTER — Emergency Department (HOSPITAL_COMMUNITY)
Admission: EM | Admit: 2015-01-22 | Discharge: 2015-01-22 | Disposition: A | Discharge status: Home / Self Care | Payer: Medicare Other | Attending: Emergency Medicine | Admitting: Emergency Medicine

## 2015-01-22 DIAGNOSIS — I1311 Hypertensive heart and chronic kidney disease without heart failure, with stage 5 chronic kidney disease, or end stage renal disease: Secondary | ICD-10-CM | POA: Insufficient documentation

## 2015-01-22 DIAGNOSIS — Z9981 Dependence on supplemental oxygen: Secondary | ICD-10-CM | POA: Diagnosis not present

## 2015-01-22 DIAGNOSIS — M1990 Unspecified osteoarthritis, unspecified site: Secondary | ICD-10-CM | POA: Insufficient documentation

## 2015-01-22 DIAGNOSIS — G629 Polyneuropathy, unspecified: Secondary | ICD-10-CM

## 2015-01-22 DIAGNOSIS — M109 Gout, unspecified: Secondary | ICD-10-CM | POA: Insufficient documentation

## 2015-01-22 DIAGNOSIS — Z8701 Personal history of pneumonia (recurrent): Secondary | ICD-10-CM | POA: Insufficient documentation

## 2015-01-22 DIAGNOSIS — Z862 Personal history of diseases of the blood and blood-forming organs and certain disorders involving the immune mechanism: Secondary | ICD-10-CM | POA: Diagnosis not present

## 2015-01-22 DIAGNOSIS — R200 Anesthesia of skin: Secondary | ICD-10-CM | POA: Diagnosis present

## 2015-01-22 DIAGNOSIS — Z7952 Long term (current) use of systemic steroids: Secondary | ICD-10-CM | POA: Diagnosis not present

## 2015-01-22 DIAGNOSIS — N186 End stage renal disease: Secondary | ICD-10-CM | POA: Insufficient documentation

## 2015-01-22 DIAGNOSIS — E785 Hyperlipidemia, unspecified: Secondary | ICD-10-CM | POA: Insufficient documentation

## 2015-01-22 DIAGNOSIS — Z9889 Other specified postprocedural states: Secondary | ICD-10-CM | POA: Insufficient documentation

## 2015-01-22 DIAGNOSIS — Z992 Dependence on renal dialysis: Secondary | ICD-10-CM | POA: Insufficient documentation

## 2015-01-22 DIAGNOSIS — Z7982 Long term (current) use of aspirin: Secondary | ICD-10-CM | POA: Diagnosis not present

## 2015-01-22 DIAGNOSIS — Z792 Long term (current) use of antibiotics: Secondary | ICD-10-CM | POA: Diagnosis not present

## 2015-01-22 DIAGNOSIS — I252 Old myocardial infarction: Secondary | ICD-10-CM | POA: Diagnosis not present

## 2015-01-22 DIAGNOSIS — Z87891 Personal history of nicotine dependence: Secondary | ICD-10-CM | POA: Diagnosis not present

## 2015-01-22 DIAGNOSIS — Z79899 Other long term (current) drug therapy: Secondary | ICD-10-CM | POA: Insufficient documentation

## 2015-01-22 LAB — CBC WITH DIFFERENTIAL/PLATELET
Basophils Absolute: 0 10*3/uL (ref 0.0–0.1)
Basophils Relative: 0 % (ref 0–1)
Eosinophils Absolute: 0.1 10*3/uL (ref 0.0–0.7)
Eosinophils Relative: 1 % (ref 0–5)
HEMATOCRIT: 30.7 % — AB (ref 39.0–52.0)
Hemoglobin: 9.4 g/dL — ABNORMAL LOW (ref 13.0–17.0)
LYMPHS PCT: 9 % — AB (ref 12–46)
Lymphs Abs: 0.7 10*3/uL (ref 0.7–4.0)
MCH: 29.7 pg (ref 26.0–34.0)
MCHC: 30.6 g/dL (ref 30.0–36.0)
MCV: 97.2 fL (ref 78.0–100.0)
MONO ABS: 1 10*3/uL (ref 0.1–1.0)
Monocytes Relative: 13 % — ABNORMAL HIGH (ref 3–12)
NEUTROS ABS: 6.1 10*3/uL (ref 1.7–7.7)
Neutrophils Relative %: 77 % (ref 43–77)
Platelets: 292 10*3/uL (ref 150–400)
RBC: 3.16 MIL/uL — ABNORMAL LOW (ref 4.22–5.81)
RDW: 18.3 % — AB (ref 11.5–15.5)
WBC: 7.9 10*3/uL (ref 4.0–10.5)

## 2015-01-22 LAB — BASIC METABOLIC PANEL
Anion gap: 12 (ref 5–15)
BUN: 51 mg/dL — AB (ref 6–20)
CHLORIDE: 104 mmol/L (ref 101–111)
CO2: 25 mmol/L (ref 22–32)
CREATININE: 8.34 mg/dL — AB (ref 0.61–1.24)
Calcium: 9 mg/dL (ref 8.9–10.3)
GFR calc Af Amer: 6 mL/min — ABNORMAL LOW (ref >60)
GFR calc non Af Amer: 5 mL/min — ABNORMAL LOW (ref >60)
Glucose, Bld: 87 mg/dL (ref 70–99)
Potassium: 4.7 mmol/L (ref 3.5–5.1)
Sodium: 141 mmol/L (ref 135–145)

## 2015-01-22 MED ORDER — GABAPENTIN 100 MG PO CAPS: 100.0000 mg | ORAL_CAPSULE | Freq: Three times a day (TID) | ORAL | Status: DC

## 2015-01-22 NOTE — ED Notes (Signed)
Pt reports right BKA two weeks ago, bleeding at end of stump has increased.  Pt also reports left foot numb, swollen and is turning black.

## 2015-01-22 NOTE — Discharge Instructions (Signed)

## 2015-01-22 NOTE — ED Provider Notes (Signed)
CSN: [id number]     Arrival date & time 01/22/15  1434 History   First MD Initiated Contact with Patient 01/22/15 1608     Chief Complaint  Patient presents with  . Post-op Problem      HPI Pt reports right BKA two weeks ago, bleeding at end of stump has increased. Pt also reports left foot numb, swollen and is turning black.  Past Medical History  Diagnosis Date  . Hypertensive heart disease   . Hypercholesterolemia   . Gout   . Secondary cardiomyopathy     LVEF 40-45%  . Anemia of chronic disease   . Hypertension   . Arthritis   . Peripheral vascular disease   . ESRD on peritoneal dialysis   . On home oxygen therapy     "use what I need to" (08/17/2014)  . Pneumonia 09/2012  . Pericarditis 08/17/2014  . NSTEMI (non-ST elevated myocardial infarction) 08/17/2014   Past Surgical History  Procedure Laterality Date  . Knee arthroscopy Right 2007  . Back surgery    . Hemorrhoid surgery  1970's  . Ganglion cyst excision  01/03/2012    Procedure: REMOVAL GANGLION OF WRIST;  Surgeon: Scherry Ran, MD;  Location: [facility];  Service: General;  Laterality: Right;  . Colonoscopy    . Av fistula placement  08/24/2012    Procedure: ARTERIOVENOUS (AV) FISTULA CREATION;  Surgeon: Rosetta Posner, MD;  Location: [place];  Service: Vascular;  Laterality: Left;  . Insertion of dialysis catheter Right      neck  . Insertion of dialysis catheter  10/19/2012    Procedure: INSERTION OF DIALYSIS CATHETER;  Surgeon: Rosetta Posner, MD;  Location: [facility];  Service: Vascular;  Laterality: N/A;  REMOVE TEMPORARY CATH  . Cardiac catheterization  08/17/2014  . Cholecystectomy    . Lumbar disc surgery  2004  . Left heart catheterization with coronary angiogram N/A 08/17/2014    Procedure: LEFT HEART CATHETERIZATION WITH CORONARY ANGIOGRAM;  Surgeon: Larey Dresser, MD;  Location: [facility] CATH LAB;  Service: Cardiovascular;  Laterality: N/A;  . Colonoscopy Left 09/26/2014    Procedure: COLONOSCOPY;  Surgeon:  Arta Silence, MD;  Location: [facility] ENDOSCOPY;  Service: Endoscopy;  Laterality: Left;  . Amputation Right 01/05/2015    Procedure: AMPUTATION BELOW KNEE;  Surgeon: Angelia Mould, MD;  Location: [facility] OR;  Service: Vascular;  Laterality: Right;   Family History  Problem Relation Age of Onset  . Arthritis    . Cancer    . Kidney disease    . Anesthesia problems Neg Hx   . Hypotension Neg Hx   . Malignant hyperthermia Neg Hx   . Pseudochol deficiency Neg Hx   . Cancer Sister   . Cancer Brother     colon  . Cancer Brother     colon   History  Substance Use Topics  . Smoking status: Former Smoker -- 1.00 packs/day for 30 years    Types: Cigarettes    Quit date: 08/19/1998  . Smokeless tobacco: Former Systems developer    Types: Chew    Quit date: 12/31/1993  . Alcohol Use: No    Review of Systems All other systems reviewed and are negative   Allergies  Review of patient's allergies indicates no known allergies.  Home Medications   Prior to Admission medications   Medication Sig Start Date End Date Taking? Authorizing Provider  amLODipine (NORVASC) 10 MG tablet Take 1 tablet (10 mg total) by mouth daily.  Patient not taking: Reported on 12/31/2014 06/23/14   Lucia Gaskins, MD  aspirin 325 MG tablet Take 1 tablet (325 mg total) by mouth 2 (two) times daily. Patient taking differently: Take 325 mg by mouth daily.  08/23/14   Rhonda G Barrett, PA-C  atorvastatin (LIPITOR) 40 MG tablet Take 1 tablet (40 mg total) by mouth daily at 6 PM. 01/13/15   Barton Dubois, MD  calcitRIOL (ROCALTROL) 0.5 MCG capsule Take 1 capsule (0.5 mcg total) by mouth daily. 06/23/14   Lucia Gaskins, MD  carvedilol (COREG) 25 MG tablet Take 1 tablet (25 mg total) by mouth 2 (two) times daily with a meal. 01/13/15   Barton Dubois, MD  cloNIDine (CATAPRES) 0.2 MG tablet Take 0.2 mg by mouth 2 (two) times daily as needed (blood pressure >130/27).  12/27/14   Historical Provider, MD  COLCRYS 0.6 MG tablet Take 1 tablet  by mouth daily as needed (gout flare up).  10/31/14   Historical Provider, MD  docusate sodium 100 MG CAPS Take 100 mg by mouth 2 (two) times daily. Patient not taking: Reported on 12/27/2014 08/23/14   Evelene Croon Barrett, PA-C  doxycycline (VIBRA-TABS) 100 MG tablet Take 1 tablet (100 mg total) by mouth every 12 (twelve) hours. 01/13/15   Barton Dubois, MD  feeding supplement, RESOURCE BREEZE, (RESOURCE BREEZE) LIQD Take 1 Container by mouth 3 (three) times daily between meals. 09/30/14   Annita Brod, MD  gabapentin (NEURONTIN) 100 MG capsule Take 1 capsule (100 mg total) by mouth 3 (three) times daily. 01/22/15   Leonard Schwartz, MD  hydrALAZINE (APRESOLINE) 25 MG tablet Take 1 tablet (25 mg total) by mouth 3 (three) times daily. Patient not taking: Reported on 12/27/2014 08/23/14   Evelene Croon Barrett, PA-C  labetalol (NORMODYNE) 100 MG tablet Take 1 tablet (100 mg total) by mouth 2 (two) times daily. 01/13/15   Barton Dubois, MD  multivitamin (RENA-VIT) TABS tablet Take 1 tablet by mouth daily.    Historical Provider, MD  oxyCODONE-acetaminophen (PERCOCET/ROXICET) 5-325 MG per tablet Take 1 tablet by mouth every 6 (six) hours as needed for severe pain. 01/13/15   Barton Dubois, MD  pantoprazole (PROTONIX) 40 MG tablet Take 1 tablet (40 mg total) by mouth daily at 12 noon. Patient taking differently: Take 40 mg by mouth daily as needed (acid reflux).  08/23/14   Rhonda G Barrett, PA-C  polyethylene glycol (MIRALAX / GLYCOLAX) packet Take 17 g by mouth daily. Patient not taking: Reported on 12/27/2014 08/23/14   Evelene Croon Barrett, PA-C  predniSONE (DELTASONE) 20 MG tablet Take 2 tablets by mouth daily X 2 days; then 1 tablet by mouth daily X 2 days; then 1/2 tablet by mouth daily X 3 days and stop prednisone 01/13/15   Barton Dubois, MD  rOPINIRole (REQUIP) 0.25 MG tablet Take 0.25-0.5 mg by mouth at bedtime as needed (restless legs). 1-2 tablets based on severity of restless legs    Historical Provider, MD  sevelamer  carbonate (RENVELA) 800 MG tablet Take 1,600-2,400 mg by mouth See admin instructions. Take 3 tablets (2400 mg) with meals and 2 tablets (1600 mg) with snacks    Historical Provider, MD   BP 143/66 mmHg  Pulse 63  Temp(Src) 98.1 F (36.7 C) (Oral)  Resp 16  Ht 5\' 9"  (1.753 m)  Wt 160 lb (72.576 kg)  BMI 23.62 kg/m2  SpO2 96% Physical Exam  Constitutional: He is oriented to person, place, and time. He appears well-developed and well-nourished. No distress.  HENT:  Head: Normocephalic and atraumatic.  Eyes: Pupils are equal, round, and reactive to light.  Neck: Normal range of motion.  Cardiovascular: Normal rate and intact distal pulses.   Pulmonary/Chest: No respiratory distress.  Abdominal: Normal appearance. He exhibits no distension.  Musculoskeletal:       Legs: Neurological: He is alert and oriented to person, place, and time. No cranial nerve deficit.  Skin: Skin is warm and dry. No rash noted.  Psychiatric: He has a normal mood and affect. His behavior is normal.  Nursing note and vitals reviewed.   ED Course  Procedures (including critical care time) Labs Review Labs Reviewed  CBC WITH DIFFERENTIAL/PLATELET - Abnormal; Notable for the following:    RBC 3.16 (*)    Hemoglobin 9.4 (*)    HCT 30.7 (*)    RDW 18.3 (*)    Lymphocytes Relative 9 (*)    Monocytes Relative 13 (*)    All other components within normal limits  BASIC METABOLIC PANEL - Abnormal; Notable for the following:    BUN 51 (*)    Creatinine, Ser 8.34 (*)    GFR calc non Af Amer 5 (*)    GFR calc Af Amer 6 (*)    All other components within normal limits    Imaging Review No results found.    MDM   Final diagnoses:  Neuropathy        Leonard Schwartz, MD 01/22/15 [phone]

## 2015-02-06 ENCOUNTER — Encounter: Payer: Self-pay | Admitting: Vascular Surgery

## 2015-02-08 ENCOUNTER — Encounter: Payer: Self-pay | Admitting: Vascular Surgery

## 2015-02-08 ENCOUNTER — Ambulatory Visit (INDEPENDENT_AMBULATORY_CARE_PROVIDER_SITE_OTHER): Payer: Self-pay | Admitting: Vascular Surgery

## 2015-02-08 VITALS — BP 131/59 | HR 78 | Ht 69.0 in | Wt 160.0 lb

## 2015-02-08 DIAGNOSIS — Z48812 Encounter for surgical aftercare following surgery on the circulatory system: Secondary | ICD-10-CM

## 2015-02-08 NOTE — Addendum Note (Signed)
Addended by: Mena Goes on: 02/08/2015 02:17 PM   Modules accepted: Orders

## 2015-02-08 NOTE — Progress Notes (Signed)
Patient name: Randy [name] MRN: [id number] DOB: [birth year] Sex: male  REASON FOR VISIT: Follow up after right below the knee amputation.  HPI: Randy [name] is a 81 y.o. male who presented with a nonhealing wound of the right foot and multilevel arterial occlusive disease. He was not a candidate for revascularization. He underwent a right below-the-knee amputation on 01/05/2015. He returns for a 1 month follow up visit.  He has no specific complaints. He denies fever or chills.  Current Outpatient Prescriptions  Medication Sig Dispense Refill  . amLODipine (NORVASC) 10 MG tablet Take 1 tablet (10 mg total) by mouth daily. 30 tablet 3  . aspirin 325 MG tablet Take 1 tablet (325 mg total) by mouth 2 (two) times daily. (Patient taking differently: Take 325 mg by mouth daily. )    . atorvastatin (LIPITOR) 40 MG tablet Take 1 tablet (40 mg total) by mouth daily at 6 PM. 30 tablet 1  . calcitRIOL (ROCALTROL) 0.5 MCG capsule Take 1 capsule (0.5 mcg total) by mouth daily. 30 capsule 3  . carvedilol (COREG) 25 MG tablet Take 1 tablet (25 mg total) by mouth 2 (two) times daily with a meal. 60 tablet 1  . cloNIDine (CATAPRES) 0.2 MG tablet Take 0.2 mg by mouth 2 (two) times daily as needed (blood pressure >130/27).   10  . COLCRYS 0.6 MG tablet Take 1 tablet by mouth daily as needed (gout flare up).   2  . docusate sodium 100 MG CAPS Take 100 mg by mouth 2 (two) times daily. 10 capsule 0  . feeding supplement, RESOURCE BREEZE, (RESOURCE BREEZE) LIQD Take 1 Container by mouth 3 (three) times daily between meals. 90 Container 5  . gabapentin (NEURONTIN) 100 MG capsule Take 1 capsule (100 mg total) by mouth 3 (three) times daily. 60 capsule 0  . hydrALAZINE (APRESOLINE) 25 MG tablet Take 1 tablet (25 mg total) by mouth 3 (three) times daily. 90 tablet 3  . labetalol (NORMODYNE) 100 MG tablet Take 1 tablet (100 mg total) by mouth 2 (two) times daily. 60 tablet 1  . multivitamin (RENA-VIT) TABS tablet  Take 1 tablet by mouth daily.    Marland Kitchen oxyCODONE-acetaminophen (PERCOCET/ROXICET) 5-325 MG per tablet Take 1 tablet by mouth every 6 (six) hours as needed for severe pain. 20 tablet 0  . pantoprazole (PROTONIX) 40 MG tablet Take 1 tablet (40 mg total) by mouth daily at 12 noon. (Patient taking differently: Take 40 mg by mouth daily as needed (acid reflux). ) 30 tablet 11  . polyethylene glycol (MIRALAX / GLYCOLAX) packet Take 17 g by mouth daily. 30 each 0  . predniSONE (DELTASONE) 20 MG tablet Take 2 tablets by mouth daily X 2 days; then 1 tablet by mouth daily X 2 days; then 1/2 tablet by mouth daily X 3 days and stop prednisone 9 tablet 0  . rOPINIRole (REQUIP) 0.25 MG tablet Take 0.25-0.5 mg by mouth at bedtime as needed (restless legs). 1-2 tablets based on severity of restless legs    . sevelamer carbonate (RENVELA) 800 MG tablet Take 1,600-2,400 mg by mouth See admin instructions. Take 3 tablets (2400 mg) with meals and 2 tablets (1600 mg) with snacks    . doxycycline (VIBRA-TABS) 100 MG tablet Take 1 tablet (100 mg total) by mouth every 12 (twelve) hours. (Patient not taking: Reported on 02/08/2015) 14 tablet 0   No current facility-administered medications for this visit.   REVIEW OF SYSTEMS: Valu.Nieves ] denotes positive finding; [  ]  denotes negative finding  CARDIOVASCULAR:  [ ]  chest pain   [ ]  dyspnea on exertion    CONSTITUTIONAL:  [ ]  fever   [ ]  chills  PHYSICAL EXAM: Filed Vitals:   02/08/15 0908  Height: 5\' 9"  (1.753 m)  Weight: 160 lb (72.576 kg)   GENERAL: The patient is a well-nourished male, in no acute distress. The vital signs are documented above. CARDIOVASCULAR: There is a regular rate and rhythm. PULMONARY: There is good air exchange bilaterally without wheezing or rales. His below the knee amputation site is healing nicely. There are no wounds on the left foot.  MEDICAL ISSUES: The patient is doing well status post right below-the-knee amputation. His staples were removed  in the office today. I've ordered follow up ABIs in 6 months and I'll see him back at that time to keep a close eye on his left foot. I have written him a prescription for a prosthesis for his right below the knee amputation. He knows to call sooner if he has problems. Fortunately he is not a smoker.  [name] [facility]: [phone]

## 2015-02-28 ENCOUNTER — Other Ambulatory Visit: Payer: Self-pay | Admitting: Surgery

## 2015-03-09 ENCOUNTER — Encounter (HOSPITAL_COMMUNITY)
Admission: RE | Admit: 2015-03-09 | Discharge: 2015-03-09 | Disposition: A | Discharge status: Home / Self Care | Payer: Medicare Other | Source: Ambulatory Visit | Attending: Surgery | Admitting: Surgery

## 2015-03-09 ENCOUNTER — Encounter (HOSPITAL_COMMUNITY): Payer: Self-pay

## 2015-03-09 DIAGNOSIS — Z01818 Encounter for other preprocedural examination: Secondary | ICD-10-CM | POA: Diagnosis present

## 2015-03-09 NOTE — Progress Notes (Signed)
   03/09/15 1444  OBSTRUCTIVE SLEEP APNEA  Have you ever been diagnosed with sleep apnea through a sleep study? No  Do you snore loudly (loud enough to be heard through closed doors)?  0  Do you often feel tired, fatigued, or sleepy during the daytime? 0  Has anyone observed you stop breathing during your sleep? 0  Do you have, or are you being treated for high blood pressure? 1  BMI more than 35 kg/m2? 0  Age over 79 years old? 1  Neck circumference greater than 40 cm/16 inches? 1  Gender: 1

## 2015-03-09 NOTE — Pre-Procedure Instructions (Signed)
Randy [name]  03/09/2015      [id number] - [place], [place] [place] [address] Phone: [phone] Fax: [place], [address] [place] [address] Phone: [phone] Fax: [phone]  [facility] - [place], [address] [place] [address] Phone: [phone] Fax: [phone]    Your procedure is scheduled on Mon, June 20 @ 7:30 AM  Report to [facility] at 5:30 AM  Call this number if you have problems the morning of surgery:  [phone]   Remember:  Do not eat food or drink liquids after midnight.  Take these medicines the morning of surgery with A SIP OF WATER:Carvedilol(Coreg),Clonidine(Catapres),Gabapentin(Neurontin),Apresolin(Hydralazine),Labetalol(Normodyne),Pain Pill(if needed),and Pantoprazole(Protonix)              Stop taking your Aspirin,Vitamins,and any Herbal Medications. No Goody's,BC's,Aleve,Ibuprofen,or any Fish Oil.   Do not wear jewelry.  Do not wear lotions, powders, or colgones.  You may wear deodorant.  Men may shave face and neck.  Do not bring valuables to the hospital.  [facility] is not responsible for any belongings or valuables.  Contacts, dentures or bridgework may not be worn into surgery.  Leave your suitcase in the car.  After surgery it may be brought to your room.  For patients admitted to the hospital, discharge time will be determined by your treatment team.  Patients discharged the day of surgery will not be allowed to drive home.    Special instructions:  [place] - Preparing for Surgery  Before surgery, you can play an important role.  Because skin is not sterile, your skin needs to be as free of germs as possible.  You can reduce the number of germs on you skin by washing with CHG (chlorahexidine gluconate) soap before surgery.  CHG is an antiseptic cleaner which kills germs and bonds with  the skin to continue killing germs even after washing.  Please DO NOT use if you have an allergy to CHG or antibacterial soaps.  If your skin becomes reddened/irritated stop using the CHG and inform your nurse when you arrive at Short Stay.  Do not shave (including legs and underarms) for at least 48 hours prior to the first CHG shower.  You may shave your face.  Please follow these instructions carefully:   1.  Shower with CHG Soap the night before surgery and the                                morning of Surgery.  2.  If you choose to wash your hair, wash your hair first as usual with your       normal shampoo.  3.  After you shampoo, rinse your hair and body thoroughly to remove the                      Shampoo.  4.  Use CHG as you would any other liquid soap.  You can apply chg directly       to the skin and wash gently with scrungie or a clean washcloth.  5.  Apply the CHG Soap to your body ONLY FROM THE NECK DOWN.        Do not use on open wounds or open sores.  Avoid contact with your eyes,  ears, mouth and genitals (private parts).  Wash genitals (private parts)       with your normal soap.  6.  Wash thoroughly, paying special attention to the area where your surgery        will be performed.  7.  Thoroughly rinse your body with warm water from the neck down.  8.  DO NOT shower/wash with your normal soap after using and rinsing off       the CHG Soap.  9.  Pat yourself dry with a clean towel.            10.  Wear clean pajamas.            11.  Place clean sheets on your bed the night of your first shower and do not        sleep with pets.  Day of Surgery  Do not apply any lotions/deoderants the morning of surgery.  Please wear clean clothes to the hospital/surgery center.    Please read over the following fact sheets that you were given. Pain Booklet, Coughing and Deep Breathing, MRSA Information and Surgical Site Infection Prevention

## 2015-03-12 NOTE — H&P (Signed)
Randy [name] 02/28/2015 10:23 AM Location: [facility] Patient #: [id number] DOB: 1932-03-05 Married / Language: English / Race: Black or African American Male  History of Present Illness (Darianna Amy A. Ninfa Linden MD; 02/28/2015 10:38 AM) Patient words: discuss PD removal.  The patient is a 81 year old male who presents for a pre-op visit. This gentleman is referred by the nephrologist for PD catheter removal. It was placed in [place]. He is currently on hemodialysis. The catheter is been in place for about a year and is not being used.   Other Problems Marjean Donna, CMA; 02/28/2015 10:23 AM) Arthritis Chronic Renal Failure Syndrome  Past Surgical History Marjean Donna, CMA; 02/28/2015 10:23 AM) Dialysis Shunt / Fistula Foot Surgery Right. Resection of Stomach  Diagnostic Studies History Marjean Donna, CMA; 02/28/2015 10:23 AM) Colonoscopy within last year  Allergies Davy Pique Bynum, [place]; 02/28/2015 10:24 AM) No Known Drug Allergies06/03/2015  Medication History (Sonya Bynum, CMA; 02/28/2015 10:27 AM) AmLODIPine Besylate (10MG  Tablet, Oral) Active. Aspirin EC (325MG  Tablet DR, Oral) Active. Atorvastatin Calcium (40MG  Tablet, Oral) Active. Rocaltrol (0.5MCG Capsule, Oral) Active. Carvedilol (25MG  Tablet, Oral) Active. CloNIDine HCl (0.2MG  Tablet, Oral) Active. Colcrys (0.6MG  Tablet, Oral) Active. Docusate Sodium (100MG  Capsule, Oral) Active. Gabapentin (100MG  Capsule, Oral) Active. HydrALAZINE HCl (25MG  Tablet, Oral) Active. Labetalol HCl (100MG  Tablet, Oral) Active. Multivitamins (Oral) Active. Pantoprazole Sodium (40MG  Tablet DR, Oral) Active. MiraLax (Oral) Active. Requip (0.25MG  Tablet, Oral) Active. Renvela (800MG  Tablet, Oral) Active. Medications Reconciled  Social History Marjean Donna, CMA; 02/28/2015 10:23 AM) Caffeine use Coffee. No alcohol use No drug use Tobacco use Never smoker.  Family History Marjean Donna, [place]; 02/28/2015 10:23  AM) Alcohol Abuse Brother. Arthritis Brother, Sister. Hypertension Sister.  Review of Systems ([place]; 02/28/2015 10:23 AM) General Present- Appetite Loss. Not Present- Chills, Fatigue, Fever, Night Sweats, Weight Gain and Weight Loss. Skin Present- Dryness, Non-Healing Wounds and Ulcer. Not Present- Change in Wart/Mole, Hives, Jaundice, New Lesions and Rash. HEENT Not Present- Earache, Hearing Loss, Hoarseness, Nose Bleed, Oral Ulcers, Ringing in the Ears, Seasonal Allergies, Sinus Pain, Sore Throat, Visual Disturbances, Wears glasses/contact lenses and Yellow Eyes. Respiratory Not Present- Bloody sputum, Chronic Cough, Difficulty Breathing, Snoring and Wheezing. Breast Not Present- Breast Mass, Breast Pain, Nipple Discharge and Skin Changes. Cardiovascular Present- Difficulty Breathing Lying Down, Leg Cramps, Shortness of Breath and Swelling of Extremities. Not Present- Chest Pain, Palpitations and Rapid Heart Rate. Gastrointestinal Not Present- Abdominal Pain, Bloating, Bloody Stool, Change in Bowel Habits, Chronic diarrhea, Constipation, Difficulty Swallowing, Excessive gas, Gets full quickly at meals, Hemorrhoids, Indigestion, Nausea, Rectal Pain and Vomiting. Male Genitourinary Not Present- Blood in Urine, Change in Urinary Stream, Frequency, Impotence, Nocturia, Painful Urination, Urgency and Urine Leakage. Musculoskeletal Present- Swelling of Extremities. Not Present- Back Pain, Joint Pain, Joint Stiffness, Muscle Pain and Muscle Weakness. Neurological Present- Tremor, Trouble walking and Weakness. Not Present- Decreased Memory, Fainting, Headaches, Numbness, Seizures and Tingling. Psychiatric Not Present- Anxiety, Bipolar, Change in Sleep Pattern, Depression, Fearful and Frequent crying. Endocrine Present- Cold Intolerance. Not Present- Excessive Hunger, Hair Changes, Heat Intolerance, Hot flashes and New Diabetes. Hematology Not Present- Easy Bruising, Excessive bleeding, Gland  problems, HIV and Persistent Infections.   Vitals (Sonya Bynum CMA; 02/28/2015 10:24 AM) 02/28/2015 10:23 AM Weight: 145 lb Height: 69in Body Surface Area: 1.79 m Body Mass Index: 21.41 kg/m Temp.: 97.75F(Temporal)  Pulse: 87 (Regular)  BP: 122/70 (Sitting, Left Arm, Standard)    Physical Exam (Aayana Reinertsen A. Ninfa Linden MD; 02/28/2015 10:38 AM) The physical exam findings are  as follows: Note:On exam, he is in a wheelchair. He is awake and alert. His abdomen is soft and nontender with a PD catheter in place Lungs clear CV RRR Ext with fistula No erythema    Assessment & Plan (Tiras Bianchini A. Ninfa Linden MD; 02/28/2015 10:39 AM) PD CATHETER DYSFUNCTION (996.56  T85.611A) Impression: At the request of nephrologist, we will schedule removal of this catheter in the operating room. I discussed the risks with him which includes bleeding, infection, injury to other structures, etc. He understands and wishes to proceed

## 2015-03-13 ENCOUNTER — Encounter (HOSPITAL_COMMUNITY)
Admission: RE | Disposition: A | Discharge status: Home / Self Care | Payer: Self-pay | Source: Ambulatory Visit | Attending: Surgery

## 2015-03-13 ENCOUNTER — Observation Stay (HOSPITAL_COMMUNITY)
Admission: RE | Admit: 2015-03-13 | Discharge: 2015-03-14 | Disposition: A | Discharge status: Home / Self Care | Payer: Medicare Other | Source: Ambulatory Visit | Attending: Surgery | Admitting: Surgery

## 2015-03-13 ENCOUNTER — Ambulatory Visit (HOSPITAL_COMMUNITY): Payer: Medicare Other | Admitting: Anesthesiology

## 2015-03-13 ENCOUNTER — Encounter (HOSPITAL_COMMUNITY): Payer: Self-pay | Admitting: Anesthesiology

## 2015-03-13 ENCOUNTER — Ambulatory Visit (HOSPITAL_COMMUNITY): Payer: Medicare Other

## 2015-03-13 DIAGNOSIS — Z87891 Personal history of nicotine dependence: Secondary | ICD-10-CM | POA: Insufficient documentation

## 2015-03-13 DIAGNOSIS — I252 Old myocardial infarction: Secondary | ICD-10-CM | POA: Insufficient documentation

## 2015-03-13 DIAGNOSIS — Z4689 Encounter for fitting and adjustment of other specified devices: Principal | ICD-10-CM | POA: Insufficient documentation

## 2015-03-13 DIAGNOSIS — Z992 Dependence on renal dialysis: Secondary | ICD-10-CM | POA: Diagnosis not present

## 2015-03-13 DIAGNOSIS — I120 Hypertensive chronic kidney disease with stage 5 chronic kidney disease or end stage renal disease: Secondary | ICD-10-CM | POA: Insufficient documentation

## 2015-03-13 DIAGNOSIS — Z01818 Encounter for other preprocedural examination: Secondary | ICD-10-CM

## 2015-03-13 DIAGNOSIS — I2510 Atherosclerotic heart disease of native coronary artery without angina pectoris: Secondary | ICD-10-CM | POA: Diagnosis not present

## 2015-03-13 DIAGNOSIS — M1990 Unspecified osteoarthritis, unspecified site: Secondary | ICD-10-CM | POA: Insufficient documentation

## 2015-03-13 DIAGNOSIS — J449 Chronic obstructive pulmonary disease, unspecified: Secondary | ICD-10-CM | POA: Insufficient documentation

## 2015-03-13 DIAGNOSIS — N186 End stage renal disease: Secondary | ICD-10-CM | POA: Diagnosis not present

## 2015-03-13 DIAGNOSIS — T85611A Breakdown (mechanical) of intraperitoneal dialysis catheter, initial encounter: Secondary | ICD-10-CM | POA: Diagnosis present

## 2015-03-13 HISTORY — PX: CAPD REMOVAL: SHX5234

## 2015-03-13 LAB — RENAL FUNCTION PANEL
Albumin: 2.7 g/dL — ABNORMAL LOW (ref 3.5–5.0)
Anion gap: 12 (ref 5–15)
BUN: 65 mg/dL — ABNORMAL HIGH (ref 6–20)
CHLORIDE: 98 mmol/L — AB (ref 101–111)
CO2: 27 mmol/L (ref 22–32)
CREATININE: 9.38 mg/dL — AB (ref 0.61–1.24)
Calcium: 8.6 mg/dL — ABNORMAL LOW (ref 8.9–10.3)
GFR calc Af Amer: 5 mL/min — ABNORMAL LOW (ref >60)
GFR calc non Af Amer: 5 mL/min — ABNORMAL LOW (ref >60)
Glucose, Bld: 127 mg/dL — ABNORMAL HIGH (ref 65–99)
Phosphorus: 6.9 mg/dL — ABNORMAL HIGH (ref 2.5–4.6)
Potassium: 4.7 mmol/L (ref 3.5–5.1)
Sodium: 137 mmol/L (ref 135–145)

## 2015-03-13 LAB — CBC
HEMATOCRIT: 23.8 % — AB (ref 39.0–52.0)
Hemoglobin: 7.3 g/dL — ABNORMAL LOW (ref 13.0–17.0)
MCH: 30.5 pg (ref 26.0–34.0)
MCHC: 30.7 g/dL (ref 30.0–36.0)
MCV: 99.6 fL (ref 78.0–100.0)
Platelets: 188 10*3/uL (ref 150–400)
RBC: 2.39 MIL/uL — ABNORMAL LOW (ref 4.22–5.81)
RDW: 17.6 % — AB (ref 11.5–15.5)
WBC: 7.4 10*3/uL (ref 4.0–10.5)

## 2015-03-13 LAB — POCT I-STAT 4, (NA,K, GLUC, HGB,HCT)
Glucose, Bld: 112 mg/dL — ABNORMAL HIGH (ref 65–99)
HEMATOCRIT: 25 % — AB (ref 39.0–52.0)
Hemoglobin: 8.5 g/dL — ABNORMAL LOW (ref 13.0–17.0)
Potassium: 4.3 mmol/L (ref 3.5–5.1)
Sodium: 136 mmol/L (ref 135–145)

## 2015-03-13 SURGERY — CONTINUOUS AMBULATORY PERITONEAL DIALYSIS  (CAPD) CATHETER REMOVAL
Anesthesia: General | Site: Abdomen

## 2015-03-13 MED ORDER — CALCITRIOL 0.5 MCG PO CAPS
0.5000 ug | ORAL_CAPSULE | Freq: Every day | ORAL | Status: DC
  Administered 2015-03-13 – 2015-03-14 (×2): 0.5 ug via ORAL
  Filled 2015-03-13 (×2): qty 1

## 2015-03-13 MED ORDER — PANTOPRAZOLE SODIUM 40 MG PO TBEC: 40.0000 mg | DELAYED_RELEASE_TABLET | Freq: Every day | ORAL | Status: DC | PRN

## 2015-03-13 MED ORDER — FENTANYL CITRATE (PF) 100 MCG/2ML IJ SOLN
INTRAMUSCULAR | Status: DC | PRN
  Administered 2015-03-13 (×2): 50 ug via INTRAVENOUS

## 2015-03-13 MED ORDER — ASPIRIN 81 MG PO TABS: 81.0000 mg | ORAL_TABLET | Freq: Every day | ORAL | Status: DC

## 2015-03-13 MED ORDER — COLCHICINE 0.6 MG PO TABS
0.6000 mg | ORAL_TABLET | Freq: Every day | ORAL | Status: DC | PRN
  Filled 2015-03-13: qty 1

## 2015-03-13 MED ORDER — LIDOCAINE HCL (CARDIAC) 20 MG/ML IV SOLN
INTRAVENOUS | Status: DC | PRN
  Administered 2015-03-13: 50 mg via INTRAVENOUS

## 2015-03-13 MED ORDER — BUPIVACAINE-EPINEPHRINE 0.25% -1:200000 IJ SOLN
INTRAMUSCULAR | Status: DC | PRN
  Administered 2015-03-13: 30 mL

## 2015-03-13 MED ORDER — PROMETHAZINE HCL 25 MG/ML IJ SOLN: 6.2500 mg | INTRAMUSCULAR | Status: DC | PRN

## 2015-03-13 MED ORDER — LISINOPRIL 10 MG PO TABS
10.0000 mg | ORAL_TABLET | Freq: Every day | ORAL | Status: DC
  Administered 2015-03-13 – 2015-03-14 (×2): 10 mg via ORAL
  Filled 2015-03-13 (×2): qty 1

## 2015-03-13 MED ORDER — PHENYLEPHRINE HCL 10 MG/ML IJ SOLN
10.0000 mg | INTRAVENOUS | Status: DC | PRN
  Administered 2015-03-13: 25 ug/min via INTRAVENOUS

## 2015-03-13 MED ORDER — HYDROMORPHONE HCL 1 MG/ML IJ SOLN: 0.2500 mg | INTRAMUSCULAR | Status: DC | PRN

## 2015-03-13 MED ORDER — CARVEDILOL 12.5 MG PO TABS
ORAL_TABLET | ORAL | Status: AC
  Filled 2015-03-13: qty 1

## 2015-03-13 MED ORDER — ASPIRIN EC 81 MG PO TBEC
81.0000 mg | DELAYED_RELEASE_TABLET | Freq: Every day | ORAL | Status: DC
  Administered 2015-03-13 – 2015-03-14 (×2): 81 mg via ORAL
  Filled 2015-03-13 (×2): qty 1

## 2015-03-13 MED ORDER — ONDANSETRON HCL 4 MG/2ML IJ SOLN
INTRAMUSCULAR | Status: DC | PRN
  Administered 2015-03-13: 4 mg via INTRAVENOUS

## 2015-03-13 MED ORDER — ONDANSETRON HCL 4 MG/2ML IJ SOLN
INTRAMUSCULAR | Status: AC
  Filled 2015-03-13: qty 2

## 2015-03-13 MED ORDER — BUPIVACAINE-EPINEPHRINE (PF) 0.25% -1:200000 IJ SOLN
INTRAMUSCULAR | Status: AC
  Filled 2015-03-13: qty 30

## 2015-03-13 MED ORDER — HYDRALAZINE HCL 25 MG PO TABS: 25.0000 mg | ORAL_TABLET | Freq: Three times a day (TID) | ORAL | Status: DC | PRN

## 2015-03-13 MED ORDER — ROPINIROLE HCL 0.25 MG PO TABS
0.2500 mg | ORAL_TABLET | Freq: Every evening | ORAL | Status: DC | PRN
  Filled 2015-03-13: qty 1

## 2015-03-13 MED ORDER — MORPHINE SULFATE 2 MG/ML IJ SOLN: 1.0000 mg | INTRAMUSCULAR | Status: DC | PRN

## 2015-03-13 MED ORDER — SEVELAMER CARBONATE 800 MG PO TABS: 1600.0000 mg | ORAL_TABLET | ORAL | Status: DC | PRN

## 2015-03-13 MED ORDER — ROCURONIUM BROMIDE 50 MG/5ML IV SOLN
INTRAVENOUS | Status: AC
  Filled 2015-03-13: qty 1

## 2015-03-13 MED ORDER — PROPOFOL 10 MG/ML IV BOLUS
INTRAVENOUS | Status: AC
  Filled 2015-03-13: qty 20

## 2015-03-13 MED ORDER — CEFAZOLIN SODIUM-DEXTROSE 2-3 GM-% IV SOLR
2.0000 g | INTRAVENOUS | Status: AC
  Administered 2015-03-13: 2 g via INTRAVENOUS

## 2015-03-13 MED ORDER — EPHEDRINE SULFATE 50 MG/ML IJ SOLN
INTRAMUSCULAR | Status: AC
  Filled 2015-03-13: qty 1

## 2015-03-13 MED ORDER — ONDANSETRON HCL 4 MG PO TABS: 4.0000 mg | ORAL_TABLET | Freq: Four times a day (QID) | ORAL | Status: DC | PRN

## 2015-03-13 MED ORDER — FENTANYL CITRATE (PF) 250 MCG/5ML IJ SOLN
INTRAMUSCULAR | Status: AC
  Filled 2015-03-13: qty 5

## 2015-03-13 MED ORDER — SODIUM CHLORIDE 0.9 % IV SOLN
INTRAVENOUS | Status: DC | PRN
  Administered 2015-03-13: 07:00:00 via INTRAVENOUS

## 2015-03-13 MED ORDER — LIDOCAINE HCL (CARDIAC) 20 MG/ML IV SOLN
INTRAVENOUS | Status: AC
  Filled 2015-03-13: qty 5

## 2015-03-13 MED ORDER — LABETALOL HCL 100 MG PO TABS: 100.0000 mg | ORAL_TABLET | Freq: Two times a day (BID) | ORAL | Status: DC | PRN

## 2015-03-13 MED ORDER — CARVEDILOL 25 MG PO TABS
25.0000 mg | ORAL_TABLET | Freq: Every day | ORAL | Status: DC
  Filled 2015-03-13: qty 1

## 2015-03-13 MED ORDER — RENA-VITE PO TABS
1.0000 | ORAL_TABLET | Freq: Every day | ORAL | Status: DC
  Administered 2015-03-13: 1 via ORAL
  Filled 2015-03-13 (×2): qty 1

## 2015-03-13 MED ORDER — 0.9 % SODIUM CHLORIDE (POUR BTL) OPTIME
TOPICAL | Status: DC | PRN
  Administered 2015-03-13: 1000 mL

## 2015-03-13 MED ORDER — HYDROCODONE-ACETAMINOPHEN 5-325 MG PO TABS: 1.0000 | ORAL_TABLET | ORAL | Status: DC | PRN

## 2015-03-13 MED ORDER — SODIUM CHLORIDE 0.9 % IV SOLN: INTRAVENOUS | Status: DC

## 2015-03-13 MED ORDER — PROPOFOL 10 MG/ML IV BOLUS
INTRAVENOUS | Status: DC | PRN
  Administered 2015-03-13: 30 mg via INTRAVENOUS
  Administered 2015-03-13: 70 mg via INTRAVENOUS

## 2015-03-13 MED ORDER — CEFAZOLIN SODIUM-DEXTROSE 2-3 GM-% IV SOLR
INTRAVENOUS | Status: AC
  Filled 2015-03-13: qty 50

## 2015-03-13 MED ORDER — CARVEDILOL 25 MG PO TABS
25.0000 mg | ORAL_TABLET | Freq: Two times a day (BID) | ORAL | Status: DC
  Administered 2015-03-13 – 2015-03-14 (×2): 25 mg via ORAL
  Filled 2015-03-13 (×4): qty 1

## 2015-03-13 MED ORDER — MIDAZOLAM HCL 2 MG/2ML IJ SOLN
INTRAMUSCULAR | Status: AC
  Filled 2015-03-13: qty 2

## 2015-03-13 MED ORDER — SEVELAMER CARBONATE 800 MG PO TABS
2400.0000 mg | ORAL_TABLET | Freq: Three times a day (TID) | ORAL | Status: DC
  Administered 2015-03-14: 2400 mg via ORAL
  Filled 2015-03-13 (×4): qty 3

## 2015-03-13 MED ORDER — SODIUM CHLORIDE 0.9 % IJ SOLN
INTRAMUSCULAR | Status: AC
  Filled 2015-03-13: qty 10

## 2015-03-13 MED ORDER — BOOST / RESOURCE BREEZE PO LIQD
1.0000 | Freq: Three times a day (TID) | ORAL | Status: DC
  Administered 2015-03-14: 1 via ORAL

## 2015-03-13 MED ORDER — EPHEDRINE SULFATE 50 MG/ML IJ SOLN
INTRAMUSCULAR | Status: DC | PRN
  Administered 2015-03-13: 10 mg via INTRAVENOUS

## 2015-03-13 MED ORDER — CLONIDINE HCL 0.1 MG PO TABS: 0.2000 mg | ORAL_TABLET | Freq: Two times a day (BID) | ORAL | Status: DC | PRN

## 2015-03-13 MED ORDER — ONDANSETRON HCL 4 MG/2ML IJ SOLN: 4.0000 mg | Freq: Four times a day (QID) | INTRAMUSCULAR | Status: DC | PRN

## 2015-03-13 MED ORDER — CARVEDILOL 25 MG PO TABS
25.0000 mg | ORAL_TABLET | ORAL | Status: AC
  Administered 2015-03-13: 25 mg via ORAL
  Filled 2015-03-13: qty 1
  Filled 2015-03-13: qty 2

## 2015-03-13 MED ORDER — CIPROFLOXACIN IN D5W 400 MG/200ML IV SOLN
400.0000 mg | Freq: Two times a day (BID) | INTRAVENOUS | Status: AC
  Administered 2015-03-13: 400 mg via INTRAVENOUS
  Filled 2015-03-13: qty 200

## 2015-03-13 MED ORDER — GABAPENTIN 100 MG PO CAPS
100.0000 mg | ORAL_CAPSULE | Freq: Every day | ORAL | Status: DC
  Administered 2015-03-13: 100 mg via ORAL
  Filled 2015-03-13 (×2): qty 1

## 2015-03-13 MED ORDER — PHENYLEPHRINE 40 MCG/ML (10ML) SYRINGE FOR IV PUSH (FOR BLOOD PRESSURE SUPPORT)
PREFILLED_SYRINGE | INTRAVENOUS | Status: AC
  Filled 2015-03-13: qty 10

## 2015-03-13 SURGICAL SUPPLY — 37 items
BLADE SURG 15 STRL LF DISP TIS (BLADE) ×1 IMPLANT
BLADE SURG 15 STRL SS (BLADE) ×2
BLADE SURG ROTATE 9660 (MISCELLANEOUS) IMPLANT
CANISTER SUCTION 2500CC (MISCELLANEOUS) IMPLANT
COVER SURGICAL LIGHT HANDLE (MISCELLANEOUS) ×3 IMPLANT
DRAPE LAPAROTOMY T 102X78X121 (DRAPES) ×3 IMPLANT
DRAPE UTILITY XL STRL (DRAPES) ×3 IMPLANT
ELECT CAUTERY BLADE 6.4 (BLADE) ×3 IMPLANT
ELECT REM PT RETURN 9FT ADLT (ELECTROSURGICAL) ×3
ELECTRODE REM PT RTRN 9FT ADLT (ELECTROSURGICAL) ×1 IMPLANT
GAUZE SPONGE 4X4 16PLY XRAY LF (GAUZE/BANDAGES/DRESSINGS) ×3 IMPLANT
GLOVE BIO SURGEON STRL SZ 6.5 (GLOVE) ×2 IMPLANT
GLOVE BIO SURGEON STRL SZ7.5 (GLOVE) ×3 IMPLANT
GLOVE BIO SURGEONS STRL SZ 6.5 (GLOVE) ×1
GLOVE BIOGEL PI IND STRL 8 (GLOVE) ×1 IMPLANT
GLOVE BIOGEL PI INDICATOR 8 (GLOVE) ×2
GOWN STRL REUS W/ TWL LRG LVL3 (GOWN DISPOSABLE) ×1 IMPLANT
GOWN STRL REUS W/ TWL XL LVL3 (GOWN DISPOSABLE) ×1 IMPLANT
GOWN STRL REUS W/TWL LRG LVL3 (GOWN DISPOSABLE) ×2
GOWN STRL REUS W/TWL XL LVL3 (GOWN DISPOSABLE) ×2
KIT BASIN OR (CUSTOM PROCEDURE TRAY) ×3 IMPLANT
KIT ROOM TURNOVER OR (KITS) ×3 IMPLANT
LIQUID BAND (GAUZE/BANDAGES/DRESSINGS) ×3 IMPLANT
NEEDLE HYPO 25GX1X1/2 BEV (NEEDLE) ×3 IMPLANT
NS IRRIG 1000ML POUR BTL (IV SOLUTION) ×3 IMPLANT
PACK SURGICAL SETUP 50X90 (CUSTOM PROCEDURE TRAY) ×3 IMPLANT
PAD ARMBOARD 7.5X6 YLW CONV (MISCELLANEOUS) ×6 IMPLANT
PENCIL BUTTON HOLSTER BLD 10FT (ELECTRODE) ×3 IMPLANT
SUT MNCRL AB 3-0 PS2 18 (SUTURE) ×3 IMPLANT
SUT VICRYL 0 UR6 27IN ABS (SUTURE) ×3 IMPLANT
SYR BULB 3OZ (MISCELLANEOUS) ×3 IMPLANT
SYR CONTROL 10ML LL (SYRINGE) ×3 IMPLANT
TOWEL OR 17X24 6PK STRL BLUE (TOWEL DISPOSABLE) ×6 IMPLANT
TOWEL OR 17X26 10 PK STRL BLUE (TOWEL DISPOSABLE) ×3 IMPLANT
TUBE CONNECTING 12'X1/4 (SUCTIONS) ×1
TUBE CONNECTING 12X1/4 (SUCTIONS) ×2 IMPLANT
YANKAUER SUCT BULB TIP NO VENT (SUCTIONS) ×3 IMPLANT

## 2015-03-13 NOTE — Progress Notes (Addendum)
Report to Dr. Linna Caprice, re: pt. SOB, order rec'd to do one view CXR

## 2015-03-13 NOTE — Transfer of Care (Signed)
Immediate Anesthesia Transfer of Care Note  Patient: Randy [name]  Procedure(s) Performed: Procedure(s): CONTINUOUS AMBULATORY PERITONEAL DIALYSIS  (CAPD) CATHETER REMOVAL (N/A)  Patient Location: PACU  Anesthesia Type:General  Level of Consciousness: awake, alert , oriented and patient cooperative  Airway & Oxygen Therapy: Patient Spontanous Breathing and Patient connected to nasal cannula oxygen  Post-op Assessment: Report given to RN, Post -op Vital signs reviewed and stable and Patient moving all extremities  Post vital signs: Reviewed and stable  Last Vitals:  Filed Vitals:   03/13/15 0829  BP: 110/61  Pulse: 80  Temp:   Resp: 10    Complications: No apparent anesthesia complications

## 2015-03-13 NOTE — Interval H&P Note (Signed)
History and Physical Interval Note:no change in H and P  03/13/2015 7:08 AM  Randy [name]  has presented today for surgery, with the diagnosis of PD Cath no longer needed  The various methods of treatment have been discussed with the patient and family. After consideration of risks, benefits and other options for treatment, the patient has consented to  Procedure(s): CONTINUOUS AMBULATORY PERITONEAL DIALYSIS  (CAPD) CATHETER REMOVAL (N/A) as a surgical intervention .  [name] history has been reviewed, patient examined, no change in status, stable for surgery.  I have reviewed the patient's chart and labs.  Questions were answered to the patient's satisfaction.     Matraca Hunkins A

## 2015-03-13 NOTE — Anesthesia Procedure Notes (Signed)
Procedure Name: LMA Insertion Date/Time: 03/13/2015 7:51 AM Performed by: Greggory Stallion, Dwon Sky L Pre-anesthesia Checklist: Patient identified, Emergency Drugs available, Suction available, Patient being monitored and Timeout performed Patient Re-evaluated:Patient Re-evaluated prior to inductionOxygen Delivery Method: Circle system utilized Preoxygenation: Pre-oxygenation with 100% oxygen Intubation Type: IV induction Ventilation: Mask ventilation without difficulty LMA: LMA inserted and LMA with gastric port inserted LMA Size: 4.0 and 5.0 Number of attempts: 3 (unable to achieve adequate seal with regular #4 or #5 ) Placement Confirmation: positive ETCO2 and breath sounds checked- equal and bilateral Tube secured with: Tape Dental Injury: Teeth and Oropharynx as per pre-operative assessment

## 2015-03-13 NOTE — Consult Note (Signed)
[facility] Renal Consultation Note  Indication for Consultation:  Management of ESRD/hemodialysis; anemia, hypertension/volume and secondary hyperparathyroidism  HPI: Randy [name] is a 81 y.o. male On chronic HD [place]  [facility] MWF admitted post  PD Cath removal that  was non functional and in place for a year.  Now post  op  No cos. And appropriate. He has a functional AvF.Recently followed by different Nephrology group , Dr. Hinda Lenis and changed to [facility]  01/12/15 .      Past Medical History  Diagnosis Date  . Hypercholesterolemia   . Gout   . Secondary cardiomyopathy     LVEF 40-45%  . Anemia of chronic disease   . Hypertension   . Arthritis   . Peripheral vascular disease   . Pneumonia 09/2012  . Pericarditis 08/17/2014  . NSTEMI (non-ST elevated myocardial infarction) 08/17/2014  . ESRD on peritoneal dialysis     M/W/F in [place]  . Shortness of breath dyspnea     pt. reports that he uses O2 as needed at home.     Past Surgical History  Procedure Laterality Date  . Knee arthroscopy Right 2007  . Back surgery    . Hemorrhoid surgery  1970's  . Ganglion cyst excision  01/03/2012    Procedure: REMOVAL GANGLION OF WRIST;  Surgeon: Scherry Ran, MD;  Location: [facility];  Service: General;  Laterality: Right;  . Colonoscopy    . Av fistula placement  08/24/2012    Procedure: ARTERIOVENOUS (AV) FISTULA CREATION;  Surgeon: Rosetta Posner, MD;  Location: [place];  Service: Vascular;  Laterality: Left;  . Insertion of dialysis catheter Right      neck  . Insertion of dialysis catheter  10/19/2012    Procedure: INSERTION OF DIALYSIS CATHETER;  Surgeon: Rosetta Posner, MD;  Location: [place];  Service: Vascular;  Laterality: N/A;  REMOVE TEMPORARY CATH  . Cardiac catheterization  08/17/2014  . Cholecystectomy    . Lumbar disc surgery  2004  . Left heart catheterization with coronary angiogram N/A 08/17/2014    Procedure: LEFT HEART CATHETERIZATION WITH  CORONARY ANGIOGRAM;  Surgeon: Larey Dresser, MD;  Location: [facility] CATH LAB;  Service: Cardiovascular;  Laterality: N/A;  . Colonoscopy Left 09/26/2014    Procedure: COLONOSCOPY;  Surgeon: Arta Silence, MD;  Location: [facility] ENDOSCOPY;  Service: Endoscopy;  Laterality: Left;  . Amputation Right 01/05/2015    Procedure: AMPUTATION BELOW KNEE;  Surgeon: Angelia Mould, MD;  Location: [facility] OR;  Service: Vascular;  Laterality: Right;      Family History  Problem Relation Age of Onset  . Arthritis    . Cancer    . Kidney disease    . Anesthesia problems Neg Hx   . Hypotension Neg Hx   . Malignant hyperthermia Neg Hx   . Pseudochol deficiency Neg Hx   . Cancer Sister   . Cancer Brother     colon  . Cancer Brother     colon      reports that he quit smoking about 16 years ago. His smoking use included Cigarettes. He has a 30 pack-year smoking history. He quit smokeless tobacco use about 21 years ago. His smokeless tobacco use included Chew. He reports that he does not drink alcohol or use illicit drugs.  No Known Allergies  Prior to Admission medications   Medication Sig Start Date End Date Taking? Authorizing Provider  aspirin 81 MG tablet Take 81 mg by mouth daily.  Yes Historical Provider, MD  atorvastatin (LIPITOR) 40 MG tablet Take 1 tablet (40 mg total) by mouth daily at 6 PM. 01/13/15  Yes Barton Dubois, MD  calcitRIOL (ROCALTROL) 0.5 MCG capsule Take 1 capsule (0.5 mcg total) by mouth daily. 06/23/14  Yes Lucia Gaskins, MD  carvedilol (COREG) 25 MG tablet Take 1 tablet (25 mg total) by mouth 2 (two) times daily with a meal. Patient taking differently: Take 25 mg by mouth daily.  01/13/15  Yes Barton Dubois, MD  cloNIDine (CATAPRES) 0.2 MG tablet Take 0.2 mg by mouth 2 (two) times daily as needed (blood pressure >130/27).  12/27/14  Yes Historical Provider, MD  COLCRYS 0.6 MG tablet Take 1 tablet by mouth daily as needed (gout flare up).  10/31/14  Yes Historical Provider, MD  feeding  supplement, RESOURCE BREEZE, (RESOURCE BREEZE) LIQD Take 1 Container by mouth 3 (three) times daily between meals. 09/30/14  Yes Annita Brod, MD  gabapentin (NEURONTIN) 100 MG capsule Take 1 capsule (100 mg total) by mouth 3 (three) times daily. Patient taking differently: Take 100 mg by mouth at bedtime.  01/22/15  Yes Leonard Schwartz, MD  hydrALAZINE (APRESOLINE) 25 MG tablet Take 1 tablet (25 mg total) by mouth 3 (three) times daily. Patient taking differently: Take 25 mg by mouth 3 (three) times daily as needed (High blood pressure).  08/23/14  Yes Rhonda G Barrett, PA-C  labetalol (NORMODYNE) 100 MG tablet Take 1 tablet (100 mg total) by mouth 2 (two) times daily. Patient taking differently: Take 100 mg by mouth 2 (two) times daily as needed (high blood pressure).  01/13/15  Yes Barton Dubois, MD  lisinopril (PRINIVIL,ZESTRIL) 10 MG tablet Take 1 tablet by mouth daily. 01/24/15  Yes Historical Provider, MD  multivitamin (RENA-VIT) TABS tablet Take 1 tablet by mouth daily.   Yes Historical Provider, MD  pantoprazole (PROTONIX) 40 MG tablet Take 1 tablet (40 mg total) by mouth daily at 12 noon. Patient taking differently: Take 40 mg by mouth daily as needed (acid reflux).  08/23/14  Yes Rhonda G Barrett, PA-C  rOPINIRole (REQUIP) 0.25 MG tablet Take 0.25-0.5 mg by mouth at bedtime as needed (restless legs). 1-2 tablets based on severity of restless legs   Yes Historical Provider, MD  sevelamer carbonate (RENVELA) 800 MG tablet Take 1,600-2,400 mg by mouth See admin instructions. Take 3 tablets (2400 mg) with meals and 2 tablets (1600 mg) with snacks   Yes Historical Provider, MD  amLODipine (NORVASC) 10 MG tablet Take 1 tablet (10 mg total) by mouth daily. Patient not taking: Reported on 02/08/2015 06/23/14   Lucia Gaskins, MD  doxycycline (VIBRA-TABS) 100 MG tablet Take 1 tablet (100 mg total) by mouth every 12 (twelve) hours. Patient not taking: Reported on 02/08/2015 01/13/15   Barton Dubois, MD   oxyCODONE-acetaminophen (PERCOCET/ROXICET) 5-325 MG per tablet Take 1 tablet by mouth every 6 (six) hours as needed for severe pain. Patient not taking: Reported on 03/07/2015 01/13/15   Barton Dubois, MD  predniSONE (DELTASONE) 20 MG tablet Take 2 tablets by mouth daily X 2 days; then 1 tablet by mouth daily X 2 days; then 1/2 tablet by mouth daily X 3 days and stop prednisone Patient not taking: Reported on 03/07/2015 01/13/15   Barton Dubois, MD     Results for orders placed or performed during the hospital encounter of 03/13/15 (from the past 48 hour(s))  I-STAT 4, (NA,K, GLUC, HGB,HCT)     Status: Abnormal   Collection Time: 03/13/15  6:42 AM  Result Value Ref Range   Sodium 136 135 - 145 mmol/L   Potassium 4.3 3.5 - 5.1 mmol/L   Glucose, Bld 112 (H) 65 - 99 mg/dL   HCT 25.0 (L) 39.0 - 52.0 %   Hemoglobin 8.5 (L) 13.0 - 17.0 g/dL   ROS: no positives  On history reported  Physical Exam: Filed Vitals:   03/13/15 0924  BP: 130/67  Pulse: 80  Temp: 97.6 F (36.4 C)  Resp: 18     General: Alert BM , NAD, OX3  appropriate HEENT: [place] , MMM, Nonicteric Neck: no jvd , supple Heart: RRR, no rub, mur or gallop Lungs: CTA bilat , non labored breathing Abdomen: soft bs pos decreased post op / L upper quad surg site clean dry Extremities: R BKA / no left pedal edema Skin: warm dry no acute rash Neuro: Alert OX3 No acute deficits noted Dialysis Access: Pos bruit L FA AVF  Dialysis Orders: Center:  [facility] on mwf  . EDW 66 HD Bath 2k /2.25ca   Time 3hr 5min   Access  LFA avf    Calcitriol 0.78mcg   po /HD  Mircera last on 6/17 100 was ^ to 150  Units IV/HD  Venofer  100mg  Other hgb 7.3  30 % tfs  Assessment/Plan 1. SP PD cath removal Sec "nonfucntional  "- no heparin with hd today / Possible dc in am / Dr. Ninfa Linden rx 2. ESRD -  HD MWF  Plan to do HD today to keep on schedule- Reck k pre hd 3. Hypertension/volume  - vol appears stable by exam  130/676 bp  4. Anemia  - hgb 8.5 ESA (  was 7.3 last week op center [employer] / fe boost finished ) On hd weekly last given 6/17  5. Metabolic bone disease -  Binders with meal  6. Nutrition - Renal diet and vitamin  Ernest Haber, PA-C [place] [phone] 03/13/2015, 11:51 AM   Patient seen and examined, agree with above note with above modifications. 81 year old recently changed from Dr. Hinda Lenis to [place] in April of this year.  He is admitted today for removal of PD cath that had been in place for a year and was not being used.  He would prefer to do his dialysis today to keep on schedule and is likely for discharge tomorrow Corliss Parish, MD 03/13/2015

## 2015-03-13 NOTE — Procedures (Signed)
Patient was seen on dialysis and the procedure was supervised.  BFR 400  Via AVF BP is  156/95.   Patient appears to be tolerating treatment well  Randy [name] A 03/13/2015

## 2015-03-13 NOTE — Op Note (Signed)
NAMEJAHVIER, ALDEA NO.:  [id number]  MEDICAL RECORD NO.:  [id number]  LOCATION:  6E05C                        FACILITY:  [facility]  PHYSICIAN:  Coralie Keens, M.D. DATE OF BIRTH:  August 21, 1932  DATE OF PROCEDURE:  03/13/2015 DATE OF DISCHARGE:                              OPERATIVE REPORT   PREOPERATIVE DIAGNOSIS:  Peritoneal dialysis catheter, no longer needed.  POSTOPERATIVE DIAGNOSIS:  Peritoneal dialysis catheter, no longer needed.  PROCEDURE:  Removal of peritoneal dialysis catheter.  SURGEON:  Coralie Keens, M.D.  ANESTHESIA:  General and 0.25% Marcaine.  ESTIMATED BLOOD LOSS:  Minimal.  PROCEDURE IN DETAIL:  The patient was brought to the operating room, identified as Ulla Gallo.  He was placed supine on the operating room table and general anesthesia was induced.  His abdomen was then prepped and draped in usual sterile fashion.  I anesthetized the skin around the exit site of the peritoneal dialysis catheter with Marcaine.  I made incision with a scalpel.  I took this down to the cuff of the catheter which I then excised the surrounding attachments too with the scalpel. I then made a counter incision on the patient's left lower quadrant over the catheter again with the scalpel after anesthetized with Marcaine.  I took this down to the catheter again and identified at the cuff at the fascia which I again excised as well.  I was then able to completely remove the catheter from the abdomen and from the abdominal wall.  I then closed the small fascial opening with a figure-of-eight 0 Vicryl suture.  I anesthetized both wounds with Marcaine and closed with 4-0 Monocryl subcuticular sutures.  The skin was then applied.  The patient tolerated the procedure well.  All the counts were correct at the end of procedure.  The patient was then extubated in operating room and taken in stable condition to recovery room.     Coralie Keens,  M.D.     DB/MEDQ  D:  03/13/2015  T:  03/13/2015  Job:  [id number]

## 2015-03-13 NOTE — Anesthesia Preprocedure Evaluation (Signed)
Anesthesia Evaluation  Patient identified by MRN, date of birth, ID band Patient awake    Reviewed: Allergy & Precautions, NPO status , Patient's Chart, lab work & pertinent test results  Airway Mallampati: II  TM Distance: >3 FB Neck ROM: Full    Dental   Pulmonary shortness of breath, COPDformer smoker,  breath sounds clear to auscultation        Cardiovascular hypertension, + CAD, + Past MI, + Peripheral Vascular Disease and +CHF Rhythm:Regular Rate:Normal     Neuro/Psych    GI/Hepatic negative GI ROS, Neg liver ROS,   Endo/Other    Renal/GU Renal disease     Musculoskeletal  (+) Arthritis -,   Abdominal   Peds  Hematology  (+) anemia ,   Anesthesia Other Findings   Reproductive/Obstetrics                             Anesthesia Physical Anesthesia Plan  ASA: III  Anesthesia Plan: General   Post-op Pain Management:    Induction: Intravenous  Airway Management Planned: LMA  Additional Equipment:   Intra-op Plan:   Post-operative Plan: Extubation in OR  Informed Consent: I have reviewed the patients History and Physical, chart, labs and discussed the procedure including the risks, benefits and alternatives for the proposed anesthesia with the patient or authorized representative who has indicated his/her understanding and acceptance.   Dental advisory given  Plan Discussed with: CRNA, Anesthesiologist and Surgeon  Anesthesia Plan Comments:         Anesthesia Quick Evaluation

## 2015-03-13 NOTE — Anesthesia Postprocedure Evaluation (Signed)
  Anesthesia Post-op Note  Patient: Randy [name]  Procedure(s) Performed: Procedure(s): CONTINUOUS AMBULATORY PERITONEAL DIALYSIS  (CAPD) CATHETER REMOVAL (N/A)  Patient Location: PACU  Anesthesia Type:General  Level of Consciousness: awake  Airway and Oxygen Therapy: Patient Spontanous Breathing  Post-op Pain: mild  Post-op Assessment: Post-op Vital signs reviewed              Post-op Vital Signs: Reviewed  Last Vitals:  Filed Vitals:   03/13/15 0900  BP: 120/64  Pulse: 80  Temp:   Resp: 16    Complications: No apparent anesthesia complications

## 2015-03-13 NOTE — Op Note (Signed)
CONTINUOUS AMBULATORY PERITONEAL DIALYSIS  (CAPD) CATHETER REMOVAL  Procedure Note  Randy [name] 03/13/2015   Pre-op Diagnosis: PD Cath no longer needed     Post-op Diagnosis: same  Procedure(s): CONTINUOUS AMBULATORY PERITONEAL DIALYSIS  (CAPD) CATHETER REMOVAL  Surgeon(s): Coralie Keens, MD  Anesthesia: General  Staff:  Circulator: Gilberto Better, RN Scrub Person: Beverlee Nims, RN Circulator Assistant: Quincy Carnes, RN  Estimated Blood Loss: Minimal                         Zacary Bauer A   Date: 03/13/2015  Time: 8:47 AM

## 2015-03-14 ENCOUNTER — Encounter (HOSPITAL_COMMUNITY): Payer: Self-pay | Admitting: Surgery

## 2015-03-14 DIAGNOSIS — Z4689 Encounter for fitting and adjustment of other specified devices: Secondary | ICD-10-CM | POA: Diagnosis not present

## 2015-03-14 MED ORDER — OXYCODONE-ACETAMINOPHEN 5-325 MG PO TABS
1.0000 | ORAL_TABLET | Freq: Four times a day (QID) | ORAL | Status: DC | PRN
Start: 2015-03-14 — End: 2015-10-21

## 2015-03-14 NOTE — Progress Notes (Signed)
Pt provided with discharge information including instruction on follow up appointments and new prescriptions. Pt verbalized understanding of all information. IV was DC'd without complication. VSS. Pt escorted out via wheelchair by family.   Tyna Jaksch, RN

## 2015-03-14 NOTE — Progress Notes (Signed)
Patient ID: Randy [name], male   DOB: 1932/05/01, 81 y.o.   MRN: [id number] Doing well post op Abdomen soft Plan:  discharge

## 2015-03-14 NOTE — Discharge Instructions (Signed)
Ok to shower starting today

## 2015-03-14 NOTE — Discharge Summary (Signed)
Physician Discharge Summary  Patient ID: Randy [name] MRN: [id number] DOB/AGE: [birth year] 79 y.o.  Admit date: 03/13/2015 Discharge date: 03/14/2015  Admission Diagnoses:  Discharge Diagnoses:  Active Problems:   PD catheter dysfunction   Discharged Condition: good  Hospital Course: uneventful post op recovery  Consults: nephrology  Significant Diagnostic Studies:   Treatments: surgery: PD cath removal  Discharge Exam: Blood pressure 124/59, pulse 83, temperature 98.5 F (36.9 C), temperature source Oral, resp. rate 17, height 5\' 9"  (1.753 m), weight 69.3 kg (152 lb 12.5 oz), SpO2 92 %. General appearance: alert, cooperative and no distress Resp: clear to auscultation bilaterally Cardio: regular rate and rhythm, S1, S2 normal, no murmur, click, rub or gallop Incision/Wound:abdomen soft, Non tender, incisions clean  Disposition: 01-Home or Self Care     Medication List    TAKE these medications        amLODipine 10 MG tablet  Commonly known as:  NORVASC  Take 1 tablet (10 mg total) by mouth daily.     aspirin 81 MG tablet  Take 81 mg by mouth daily.     atorvastatin 40 MG tablet  Commonly known as:  LIPITOR  Take 1 tablet (40 mg total) by mouth daily at 6 PM.     calcitRIOL 0.5 MCG capsule  Commonly known as:  ROCALTROL  Take 1 capsule (0.5 mcg total) by mouth daily.     carvedilol 25 MG tablet  Commonly known as:  COREG  Take 1 tablet (25 mg total) by mouth 2 (two) times daily with a meal.     cloNIDine 0.2 MG tablet  Commonly known as:  CATAPRES  Take 0.2 mg by mouth 2 (two) times daily as needed (blood pressure >130/27).     COLCRYS 0.6 MG tablet  Generic drug:  colchicine  Take 1 tablet by mouth daily as needed (gout flare up).     doxycycline 100 MG tablet  Commonly known as:  VIBRA-TABS  Take 1 tablet (100 mg total) by mouth every 12 (twelve) hours.     feeding supplement (RESOURCE BREEZE) Liqd  Take 1 Container by mouth 3 (three) times  daily between meals.     gabapentin 100 MG capsule  Commonly known as:  NEURONTIN  Take 1 capsule (100 mg total) by mouth 3 (three) times daily.     hydrALAZINE 25 MG tablet  Commonly known as:  APRESOLINE  Take 1 tablet (25 mg total) by mouth 3 (three) times daily.     labetalol 100 MG tablet  Commonly known as:  NORMODYNE  Take 1 tablet (100 mg total) by mouth 2 (two) times daily.     lisinopril 10 MG tablet  Commonly known as:  PRINIVIL,ZESTRIL  Take 1 tablet by mouth daily.     multivitamin Tabs tablet  Take 1 tablet by mouth daily.     oxyCODONE-acetaminophen 5-325 MG per tablet  Commonly known as:  PERCOCET/ROXICET  Take 1 tablet by mouth every 6 (six) hours as needed for severe pain.     pantoprazole 40 MG tablet  Commonly known as:  PROTONIX  Take 1 tablet (40 mg total) by mouth daily at 12 noon.     predniSONE 20 MG tablet  Commonly known as:  DELTASONE  Take 2 tablets by mouth daily X 2 days; then 1 tablet by mouth daily X 2 days; then 1/2 tablet by mouth daily X 3 days and stop prednisone     rOPINIRole 0.25 MG tablet  Commonly  known as:  REQUIP  Take 0.25-0.5 mg by mouth at bedtime as needed (restless legs). 1-2 tablets based on severity of restless legs     sevelamer carbonate 800 MG tablet  Commonly known as:  RENVELA  Take 1,600-2,400 mg by mouth See admin instructions. Take 3 tablets (2400 mg) with meals and 2 tablets (1600 mg) with snacks           Follow-up Information    Follow up with Harl Bowie, MD.   Specialty:  General Surgery   Why:  As needed   Contact information:   [place] [address] [phone]       Signed: Harl Bowie 03/14/2015, 6:26 AM

## 2015-03-17 ENCOUNTER — Other Ambulatory Visit (HOSPITAL_COMMUNITY)
Admission: RE | Admit: 2015-03-17 | Discharge: 2015-03-17 | Disposition: A | Discharge status: Home / Self Care | Payer: Medicare Other | Source: Ambulatory Visit | Attending: Nephrology | Admitting: Nephrology

## 2015-03-17 DIAGNOSIS — D631 Anemia in chronic kidney disease: Secondary | ICD-10-CM | POA: Insufficient documentation

## 2015-03-17 LAB — HEMOGLOBIN AND HEMATOCRIT, BLOOD
HEMATOCRIT: 23 % — AB (ref 39.0–52.0)
HEMOGLOBIN: 7.1 g/dL — AB (ref 13.0–17.0)

## 2015-03-20 ENCOUNTER — Encounter (HOSPITAL_COMMUNITY)
Admission: RE | Admit: 2015-03-20 | Discharge: 2015-03-20 | Disposition: A | Discharge status: Home / Self Care | Payer: Medicare Other | Source: Ambulatory Visit | Attending: Nephrology | Admitting: Nephrology

## 2015-03-20 DIAGNOSIS — D649 Anemia, unspecified: Secondary | ICD-10-CM | POA: Insufficient documentation

## 2015-03-20 DIAGNOSIS — Z89511 Acquired absence of right leg below knee: Secondary | ICD-10-CM | POA: Insufficient documentation

## 2015-03-20 LAB — HEMOGLOBIN AND HEMATOCRIT, BLOOD
HCT: 24.7 % — ABNORMAL LOW (ref 39.0–52.0)
HEMOGLOBIN: 7.6 g/dL — AB (ref 13.0–17.0)

## 2015-03-20 LAB — PREPARE RBC (CROSSMATCH)

## 2015-03-21 ENCOUNTER — Encounter (HOSPITAL_COMMUNITY)
Admission: RE | Admit: 2015-03-21 | Discharge: 2015-03-21 | Disposition: A | Discharge status: Home / Self Care | Payer: Medicare Other | Source: Ambulatory Visit | Attending: Nephrology | Admitting: Nephrology

## 2015-03-21 DIAGNOSIS — D649 Anemia, unspecified: Secondary | ICD-10-CM | POA: Diagnosis not present

## 2015-03-21 MED ORDER — SODIUM CHLORIDE 0.9 % IV SOLN
Freq: Once | INTRAVENOUS | Status: AC
  Administered 2015-03-21: 250 mL via INTRAVENOUS

## 2015-03-21 NOTE — Progress Notes (Signed)
Awake. O2 sat 82% on room air. Nasal cannula applied @ 2 l/m. O2 sat increased to 100%. Tolerated well.

## 2015-03-21 NOTE — Progress Notes (Signed)
Resting quietly. resp adequate/nonlabored. O2 d/c'd. Tolerated well.

## 2015-03-22 ENCOUNTER — Telehealth: Payer: Self-pay

## 2015-03-22 LAB — TYPE AND SCREEN
ABO/RH(D): O POS
Antibody Screen: NEGATIVE
Unit division: 0
Unit division: 0

## 2015-03-22 NOTE — Telephone Encounter (Signed)
Discussed with Dr. Scot Dock.  Recommended to schedule for an office appt. to discuss amputation, as pt. not a candidate for revascularization.  Recommended this appt. could be scheduled within next couple of weeks; no further vascular testing needed at this appt.

## 2015-03-22 NOTE — Telephone Encounter (Signed)
Phone call from pt's wife.  Reported the pt. needs an appt. ASAP.  Reported the pt. Has had an amputation on the right leg, and now is having problems with his other foot.  Stated the left leg and foot is numb all the time.  Also, stated the left ankle to the toes look dark, and the foot is cold.  Stated the numbness and the cold temperature of the left foot, has been present, since he had the right leg amputated in April.  Reported the pt. is confused, and it is difficult to know when he is in pain, but that "he grunts and groans off and on."  Stated "he doesn't understand what's going on."  Denied any open sores, but stated there is "a black spot on the 3rd and 4th toes," in the joint below the nailbed.  Also, reported "the sole of his left foot has a black area that feels soft."  Questioned about any redness or swelling.  Stated "there is some swelling in the left foot to just above the ankle, and it feels hard."  Requesting an appt. ASAP.

## 2015-03-22 NOTE — Telephone Encounter (Signed)
Spoke with pts wife to schedule for 03/24/15, dpm

## 2015-03-24 ENCOUNTER — Encounter: Payer: Self-pay | Admitting: Vascular Surgery

## 2015-03-24 ENCOUNTER — Ambulatory Visit (INDEPENDENT_AMBULATORY_CARE_PROVIDER_SITE_OTHER): Payer: Self-pay | Admitting: Vascular Surgery

## 2015-03-24 VITALS — BP 140/68 | HR 78 | Temp 97.3°F | Resp 14 | Ht 69.0 in | Wt 167.0 lb

## 2015-03-24 DIAGNOSIS — I749 Embolism and thrombosis of unspecified artery: Secondary | ICD-10-CM

## 2015-03-24 DIAGNOSIS — Z48812 Encounter for surgical aftercare following surgery on the circulatory system: Secondary | ICD-10-CM

## 2015-03-24 DIAGNOSIS — I7090 Unspecified atherosclerosis: Secondary | ICD-10-CM

## 2015-03-24 NOTE — Progress Notes (Signed)
Patient name: CHRISTOPHERJAME CARNELL MRN: [id number] DOB: 1931-10-23 Sex: male  REASON FOR VISIT: Left foot pain  HPI: CZAR YSAGUIRRE is a 81 y.o. male who presented with a nonhealing wound of his right foot and had evidence of multilevel arterial occlusive disease. He was not a candidate for revascularization. He underwent a right below the knee amputation on 01/05/2015. I last saw him in the office on 02/08/2015. At that time, the below the knee amputation site is healing nicely. There were no wounds on the left foot. Plan on seeing him back in 6 months.  Of note, I had originally seen him in consultation in the hospital with a wound on the right foot. He had no femoral pulses and therefore he underwent a CT angiogram on 12/31/2014. He had diffuse atherosclerotic disease involving the aorta, iliac arteries, and also infrainguinal arterial occlusive disease. He had single-vessel runoff bilaterally through the peroneal arteries.  Patient comes in today and states that his left foot feels cold often and he also has some paresthesias in the left foot. I do not get any history of rest pain. His activity is fairly limited because of his right below the knee amputation. He has no ulcers on the left foot.  Current Outpatient Prescriptions  Medication Sig Dispense Refill  . amLODipine (NORVASC) 10 MG tablet Take 1 tablet (10 mg total) by mouth daily. 30 tablet 3  . aspirin 81 MG tablet Take 81 mg by mouth daily.    Marland Kitchen atorvastatin (LIPITOR) 40 MG tablet Take 1 tablet (40 mg total) by mouth daily at 6 PM. 30 tablet 1  . calcitRIOL (ROCALTROL) 0.5 MCG capsule Take 1 capsule (0.5 mcg total) by mouth daily. 30 capsule 3  . carvedilol (COREG) 25 MG tablet Take 1 tablet (25 mg total) by mouth 2 (two) times daily with a meal. (Patient taking differently: Take 25 mg by mouth daily. ) 60 tablet 1  . cloNIDine (CATAPRES) 0.2 MG tablet Take 0.2 mg by mouth 2 (two) times daily as needed (blood pressure >130/27).   10  .  COLCRYS 0.6 MG tablet Take 1 tablet by mouth daily as needed (gout flare up).   2  . doxycycline (VIBRA-TABS) 100 MG tablet Take 1 tablet (100 mg total) by mouth every 12 (twelve) hours. 14 tablet 0  . feeding supplement, RESOURCE BREEZE, (RESOURCE BREEZE) LIQD Take 1 Container by mouth 3 (three) times daily between meals. 90 Container 5  . gabapentin (NEURONTIN) 100 MG capsule Take 1 capsule (100 mg total) by mouth 3 (three) times daily. (Patient taking differently: Take 100 mg by mouth at bedtime. ) 60 capsule 0  . hydrALAZINE (APRESOLINE) 25 MG tablet Take 1 tablet (25 mg total) by mouth 3 (three) times daily. (Patient taking differently: Take 25 mg by mouth 3 (three) times daily as needed (High blood pressure). ) 90 tablet 3  . labetalol (NORMODYNE) 100 MG tablet Take 1 tablet (100 mg total) by mouth 2 (two) times daily. (Patient taking differently: Take 100 mg by mouth 2 (two) times daily as needed (high blood pressure). ) 60 tablet 1  . lisinopril (PRINIVIL,ZESTRIL) 10 MG tablet Take 1 tablet by mouth daily.  2  . multivitamin (RENA-VIT) TABS tablet Take 1 tablet by mouth daily.    Marland Kitchen oxyCODONE-acetaminophen (PERCOCET/ROXICET) 5-325 MG per tablet Take 1 tablet by mouth every 6 (six) hours as needed for severe pain. 20 tablet 0  . pantoprazole (PROTONIX) 40 MG tablet Take 1 tablet (40  mg total) by mouth daily at 12 noon. (Patient taking differently: Take 40 mg by mouth daily as needed (acid reflux). ) 30 tablet 11  . predniSONE (DELTASONE) 20 MG tablet Take 2 tablets by mouth daily X 2 days; then 1 tablet by mouth daily X 2 days; then 1/2 tablet by mouth daily X 3 days and stop prednisone 9 tablet 0  . rOPINIRole (REQUIP) 0.25 MG tablet Take 0.25-0.5 mg by mouth at bedtime as needed (restless legs). 1-2 tablets based on severity of restless legs    . sevelamer carbonate (RENVELA) 800 MG tablet Take 1,600-2,400 mg by mouth See admin instructions. Take 3 tablets (2400 mg) with meals and 2 tablets (1600  mg) with snacks     No current facility-administered medications for this visit.   REVIEW OF SYSTEMS: Valu.Nieves ] denotes positive finding; [  ] denotes negative finding  CARDIOVASCULAR:  [ ]  chest pain   [ ]  dyspnea on exertion    CONSTITUTIONAL:  [ ]  fever   [ ]  chills  PHYSICAL EXAM: Filed Vitals:   03/24/15 1601  BP: 140/68  Pulse: 78  Temp: 97.3 F (36.3 C)  TempSrc: Oral  Resp: 14  Height: 5\' 9"  (1.753 m)  Weight: 167 lb (75.751 kg)  SpO2: 94%   GENERAL: The patient is a well-nourished male, in no acute distress. The vital signs are documented above. CARDIOVASCULAR: There is a regular rate and rhythm. PULMONARY: There is good air exchange bilaterally without wheezing or rales. I cannot palpate femoral pulses. The left foot appears adequately perfused with no ischemic ulcers. His right below-the-knee amputation site has healed completely.   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 He is 81 years old, has end-stage renal disease on dialysis, coronary artery disease, congestive heart failure, and is on home O2. He would require both an inflow procedure (axillobifemoral bypass graft) and an infrainguinal bypass. His only runoff is the peroneal artery. I've explained that I think he would be too high-risk for this and that if he developed significant rest pain or nonhealing ulcer, unfortunately he would require amputation on the left. I'll plan on seeing him back in 3 months with a follow up ABI. He is to call sooner if he has problems or his pain progresses.  [name] [facility] [place]: [phone]

## 2015-03-24 NOTE — Addendum Note (Signed)
Addended by: Dorthula Rue L on: 03/24/2015 05:23 PM   Modules accepted: Orders

## 2015-06-26 ENCOUNTER — Encounter: Payer: Self-pay | Admitting: Vascular Surgery

## 2015-06-28 ENCOUNTER — Ambulatory Visit (INDEPENDENT_AMBULATORY_CARE_PROVIDER_SITE_OTHER): Payer: Medicare Other | Admitting: Vascular Surgery

## 2015-06-28 ENCOUNTER — Ambulatory Visit (HOSPITAL_COMMUNITY)
Admission: RE | Admit: 2015-06-28 | Discharge: 2015-06-28 | Disposition: A | Discharge status: Home / Self Care | Payer: Medicare Other | Source: Ambulatory Visit | Attending: Vascular Surgery | Admitting: Vascular Surgery

## 2015-06-28 ENCOUNTER — Encounter: Payer: Self-pay | Admitting: Vascular Surgery

## 2015-06-28 VITALS — BP 129/56 | HR 73 | Ht 69.0 in | Wt 135.0 lb

## 2015-06-28 DIAGNOSIS — I7090 Unspecified atherosclerosis: Secondary | ICD-10-CM

## 2015-06-28 DIAGNOSIS — I120 Hypertensive chronic kidney disease with stage 5 chronic kidney disease or end stage renal disease: Secondary | ICD-10-CM | POA: Insufficient documentation

## 2015-06-28 DIAGNOSIS — I70209 Unspecified atherosclerosis of native arteries of extremities, unspecified extremity: Secondary | ICD-10-CM | POA: Diagnosis not present

## 2015-06-28 DIAGNOSIS — N186 End stage renal disease: Secondary | ICD-10-CM | POA: Diagnosis not present

## 2015-06-28 DIAGNOSIS — Z48812 Encounter for surgical aftercare following surgery on the circulatory system: Secondary | ICD-10-CM | POA: Diagnosis not present

## 2015-06-28 DIAGNOSIS — I771 Stricture of artery: Secondary | ICD-10-CM | POA: Insufficient documentation

## 2015-06-28 DIAGNOSIS — I749 Embolism and thrombosis of unspecified artery: Secondary | ICD-10-CM

## 2015-06-28 NOTE — Progress Notes (Signed)
      History of Present Illness  DABNEY SCHANZ is a 81 y.o. ([birth year]) male who presents for follow-up of his left foot. He is s/p right below-knee amputation on 01/05/15. This has healed and he is now ambulating with a prosthesis. He continues to complain of right foot paresthesias, "coldness" and "uncomfortableness." His symptoms have remained stable and have not worsened. He has not had any wounds. His left foot symptoms interfere with him ambulating.   The patient previously underwent CTA on 12/31/14 that showed diffuse atherosclerotic disease of the aorta, iliac arteries and infrainguinal arterial occlusive disease and single vessel runoff via the peroneal bilaterally.   The patient is not a smoker.    Physical Examination  Filed Vitals:   06/28/15 1529  BP: 129/56  Pulse: 73  Height: 5\' 9"  (1.753 m)  Weight: 135 lb (61.236 kg)  SpO2: 100%   Body mass index is 19.93 kg/(m^2).  General: A&O x 3, WDWN male in NAD  Pulmonary: Sym exp, good air movt  Vascular: Right BKA, Non palpable left pedal pulses. No wounds seen. Left arm AVF with palpable thrill.   Musculoskeletal:  Extremities without ischemic changes  Neurologic: Pain and light touch intact in extremities  Non-Invasive Vascular Imaging ABI (Date: 06/28/2015)  R: BKA  L: non compressible vessels, markedly dampened monophasic waveforms.     Medical Decision Making  ANTWIONE PICOTTE is a 81 y.o. male who presents with multilevel arterial occlusive disease with left foot paresthesias.  He is s/p right below knee amputation for a nonhealing wound of the right foot.   The patient now has right prothesis. His ambulation is limited by pain with the left foot. He is able to tolerate the pain however. He is not a candidate for revascularization given diffuse multilevel disease with only peroneal runoff. Fortunately he does not have any wounds on his left foot and is not a smoker. Stressed the importance of skin hygiene and  proper fitting shoes. If his symptoms worsen, or he is develops any wounds, his only option would be a left amputation. He will follow up in six months. He knows to contact us sooner if his symptoms worsen.   Virgina Jock, PA-C [facility] Office: [phone] Pager: [phone]  06/28/2015, 3:58 PM   This patient was seen in conjunction with Dr. Scot Dock

## 2015-06-28 NOTE — Addendum Note (Signed)
Addended by: Dorthula Rue L on: 06/28/2015 04:50 PM   Modules accepted: Orders

## 2015-08-04 ENCOUNTER — Observation Stay (HOSPITAL_COMMUNITY)
Admission: EM | Admit: 2015-08-04 | Discharge: 2015-08-08 | Disposition: A | Discharge status: Home / Self Care | Payer: Medicare Other | Attending: Family Medicine | Admitting: Family Medicine

## 2015-08-04 ENCOUNTER — Encounter (HOSPITAL_COMMUNITY): Payer: Self-pay

## 2015-08-04 ENCOUNTER — Observation Stay (HOSPITAL_BASED_OUTPATIENT_CLINIC_OR_DEPARTMENT_OTHER): Payer: Medicare Other

## 2015-08-04 ENCOUNTER — Emergency Department (HOSPITAL_COMMUNITY): Payer: Medicare Other

## 2015-08-04 DIAGNOSIS — N186 End stage renal disease: Secondary | ICD-10-CM | POA: Diagnosis not present

## 2015-08-04 DIAGNOSIS — E785 Hyperlipidemia, unspecified: Secondary | ICD-10-CM | POA: Diagnosis not present

## 2015-08-04 DIAGNOSIS — M1990 Unspecified osteoarthritis, unspecified site: Secondary | ICD-10-CM | POA: Insufficient documentation

## 2015-08-04 DIAGNOSIS — I120 Hypertensive chronic kidney disease with stage 5 chronic kidney disease or end stage renal disease: Secondary | ICD-10-CM | POA: Diagnosis not present

## 2015-08-04 DIAGNOSIS — Z8701 Personal history of pneumonia (recurrent): Secondary | ICD-10-CM | POA: Insufficient documentation

## 2015-08-04 DIAGNOSIS — Z79899 Other long term (current) drug therapy: Secondary | ICD-10-CM | POA: Diagnosis not present

## 2015-08-04 DIAGNOSIS — R7989 Other specified abnormal findings of blood chemistry: Secondary | ICD-10-CM

## 2015-08-04 DIAGNOSIS — M109 Gout, unspecified: Secondary | ICD-10-CM | POA: Insufficient documentation

## 2015-08-04 DIAGNOSIS — R079 Chest pain, unspecified: Secondary | ICD-10-CM

## 2015-08-04 DIAGNOSIS — Z992 Dependence on renal dialysis: Secondary | ICD-10-CM | POA: Diagnosis not present

## 2015-08-04 DIAGNOSIS — I429 Cardiomyopathy, unspecified: Secondary | ICD-10-CM | POA: Diagnosis not present

## 2015-08-04 DIAGNOSIS — R072 Precordial pain: Secondary | ICD-10-CM | POA: Diagnosis not present

## 2015-08-04 DIAGNOSIS — R0789 Other chest pain: Principal | ICD-10-CM | POA: Insufficient documentation

## 2015-08-04 DIAGNOSIS — E7800 Pure hypercholesterolemia, unspecified: Secondary | ICD-10-CM | POA: Insufficient documentation

## 2015-08-04 DIAGNOSIS — Z87891 Personal history of nicotine dependence: Secondary | ICD-10-CM | POA: Diagnosis not present

## 2015-08-04 DIAGNOSIS — I25119 Atherosclerotic heart disease of native coronary artery with unspecified angina pectoris: Secondary | ICD-10-CM | POA: Diagnosis not present

## 2015-08-04 DIAGNOSIS — I2510 Atherosclerotic heart disease of native coronary artery without angina pectoris: Secondary | ICD-10-CM | POA: Diagnosis not present

## 2015-08-04 HISTORY — DX: Atherosclerotic heart disease of native coronary artery without angina pectoris: I25.10

## 2015-08-04 HISTORY — DX: End stage renal disease: N18.6

## 2015-08-04 HISTORY — DX: Essential (primary) hypertension: I10

## 2015-08-04 HISTORY — DX: Dependence on renal dialysis: Z99.2

## 2015-08-04 HISTORY — DX: Personal history of pneumonia (recurrent): Z87.01

## 2015-08-04 HISTORY — DX: Dependence on renal dialysis: N18.6

## 2015-08-04 HISTORY — DX: Other cardiomyopathies: I42.8

## 2015-08-04 LAB — CBC WITH DIFFERENTIAL/PLATELET
BASOS PCT: 0 %
Basophils Absolute: 0 10*3/uL (ref 0.0–0.1)
Eosinophils Absolute: 0.4 10*3/uL (ref 0.0–0.7)
Eosinophils Relative: 4 %
HEMATOCRIT: 36.3 % — AB (ref 39.0–52.0)
Hemoglobin: 11.3 g/dL — ABNORMAL LOW (ref 13.0–17.0)
LYMPHS ABS: 1 10*3/uL (ref 0.7–4.0)
Lymphocytes Relative: 12 %
MCH: 31.7 pg (ref 26.0–34.0)
MCHC: 31.1 g/dL (ref 30.0–36.0)
MCV: 101.7 fL — AB (ref 78.0–100.0)
MONO ABS: 0.8 10*3/uL (ref 0.1–1.0)
MONOS PCT: 9 %
NEUTROS ABS: 6.7 10*3/uL (ref 1.7–7.7)
Neutrophils Relative %: 75 %
Platelets: 274 10*3/uL (ref 150–400)
RBC: 3.57 MIL/uL — ABNORMAL LOW (ref 4.22–5.81)
RDW: 19.7 % — AB (ref 11.5–15.5)
WBC: 8.9 10*3/uL (ref 4.0–10.5)

## 2015-08-04 LAB — BASIC METABOLIC PANEL
Anion gap: 15 (ref 5–15)
BUN: 61 mg/dL — AB (ref 6–20)
CHLORIDE: 94 mmol/L — AB (ref 101–111)
CO2: 29 mmol/L (ref 22–32)
CREATININE: 8.78 mg/dL — AB (ref 0.61–1.24)
Calcium: 10 mg/dL (ref 8.9–10.3)
GFR calc Af Amer: 6 mL/min — ABNORMAL LOW (ref >60)
GFR calc non Af Amer: 5 mL/min — ABNORMAL LOW (ref >60)
GLUCOSE: 126 mg/dL — AB (ref 65–99)
POTASSIUM: 5.1 mmol/L (ref 3.5–5.1)
SODIUM: 138 mmol/L (ref 135–145)

## 2015-08-04 LAB — TROPONIN I
Troponin I: 0.08 ng/mL — ABNORMAL HIGH (ref <0.031)
Troponin I: 0.26 ng/mL — ABNORMAL HIGH (ref <0.031)
Troponin I: 0.71 ng/mL (ref <0.031)
Troponin I: 1.09 ng/mL (ref <0.031)

## 2015-08-04 LAB — BRAIN NATRIURETIC PEPTIDE: B Natriuretic Peptide: 2452 pg/mL — ABNORMAL HIGH (ref 0.0–100.0)

## 2015-08-04 MED ORDER — ACETAMINOPHEN 650 MG RE SUPP: 650.0000 mg | Freq: Four times a day (QID) | RECTAL | Status: DC | PRN

## 2015-08-04 MED ORDER — ROPINIROLE HCL 0.25 MG PO TABS
0.2500 mg | ORAL_TABLET | Freq: Every evening | ORAL | Status: DC | PRN
Start: 2015-08-04 — End: 2015-08-08
  Filled 2015-08-04: qty 2

## 2015-08-04 MED ORDER — LISINOPRIL 10 MG PO TABS
10.0000 mg | ORAL_TABLET | Freq: Every day | ORAL | Status: DC
  Administered 2015-08-04 – 2015-08-08 (×5): 10 mg via ORAL
  Filled 2015-08-04 (×5): qty 1

## 2015-08-04 MED ORDER — CLONIDINE HCL 0.2 MG PO TABS: 0.2000 mg | ORAL_TABLET | Freq: Two times a day (BID) | ORAL | Status: DC | PRN

## 2015-08-04 MED ORDER — OXYCODONE-ACETAMINOPHEN 5-325 MG PO TABS
1.0000 | ORAL_TABLET | Freq: Four times a day (QID) | ORAL | Status: DC | PRN
  Administered 2015-08-04 – 2015-08-05 (×2): 1 via ORAL
  Filled 2015-08-04 (×2): qty 1

## 2015-08-04 MED ORDER — HEPARIN SODIUM (PORCINE) 5000 UNIT/ML IJ SOLN
5000.0000 [IU] | Freq: Three times a day (TID) | INTRAMUSCULAR | Status: DC
  Administered 2015-08-04 – 2015-08-08 (×10): 5000 [IU] via SUBCUTANEOUS
  Filled 2015-08-04 (×10): qty 1

## 2015-08-04 MED ORDER — SODIUM CHLORIDE 0.9 % IV SOLN: 100.0000 mL | INTRAVENOUS | Status: DC | PRN

## 2015-08-04 MED ORDER — ASPIRIN 81 MG PO CHEW
81.0000 mg | CHEWABLE_TABLET | Freq: Every day | ORAL | Status: DC
  Administered 2015-08-04 – 2015-08-08 (×5): 81 mg via ORAL
  Filled 2015-08-04 (×10): qty 1

## 2015-08-04 MED ORDER — CALCITRIOL 0.25 MCG PO CAPS
0.5000 ug | ORAL_CAPSULE | Freq: Every day | ORAL | Status: DC
  Administered 2015-08-04 – 2015-08-08 (×5): 0.5 ug via ORAL
  Filled 2015-08-04 (×9): qty 2
  Filled 2015-08-04: qty 1
  Filled 2015-08-04: qty 2

## 2015-08-04 MED ORDER — NITROGLYCERIN 0.4 MG SL SUBL: 0.4000 mg | SUBLINGUAL_TABLET | SUBLINGUAL | Status: DC | PRN

## 2015-08-04 MED ORDER — AMLODIPINE BESYLATE 5 MG PO TABS
10.0000 mg | ORAL_TABLET | Freq: Every day | ORAL | Status: DC
  Administered 2015-08-04 – 2015-08-08 (×5): 10 mg via ORAL
  Filled 2015-08-04 (×5): qty 2

## 2015-08-04 MED ORDER — CARVEDILOL 12.5 MG PO TABS
25.0000 mg | ORAL_TABLET | Freq: Two times a day (BID) | ORAL | Status: DC
  Administered 2015-08-04 – 2015-08-08 (×7): 25 mg via ORAL
  Filled 2015-08-04 (×7): qty 2

## 2015-08-04 MED ORDER — ALTEPLASE 2 MG IJ SOLR: 2.0000 mg | Freq: Once | INTRAMUSCULAR | Status: DC | PRN

## 2015-08-04 MED ORDER — SODIUM CHLORIDE 0.9 % IJ SOLN
3.0000 mL | Freq: Two times a day (BID) | INTRAMUSCULAR | Status: DC
  Administered 2015-08-04 – 2015-08-07 (×8): 3 mL via INTRAVENOUS

## 2015-08-04 MED ORDER — LIDOCAINE-PRILOCAINE 2.5-2.5 % EX CREA: 1.0000 "application " | TOPICAL_CREAM | CUTANEOUS | Status: DC | PRN

## 2015-08-04 MED ORDER — PENTAFLUOROPROP-TETRAFLUOROETH EX AERO
1.0000 | INHALATION_SPRAY | CUTANEOUS | Status: DC | PRN
Start: 2015-08-04 — End: 2015-08-08

## 2015-08-04 MED ORDER — HYDRALAZINE HCL 20 MG/ML IJ SOLN: 10.0000 mg | INTRAMUSCULAR | Status: DC | PRN

## 2015-08-04 MED ORDER — ATORVASTATIN CALCIUM 40 MG PO TABS
40.0000 mg | ORAL_TABLET | Freq: Every day | ORAL | Status: DC
  Administered 2015-08-05 – 2015-08-07 (×3): 40 mg via ORAL
  Filled 2015-08-04 (×3): qty 1

## 2015-08-04 MED ORDER — LIDOCAINE HCL (PF) 1 % IJ SOLN: 5.0000 mL | INTRAMUSCULAR | Status: DC | PRN

## 2015-08-04 MED ORDER — MORPHINE SULFATE (PF) 2 MG/ML IV SOLN: 2.0000 mg | INTRAVENOUS | Status: DC | PRN

## 2015-08-04 MED ORDER — ACETAMINOPHEN 325 MG PO TABS: 650.0000 mg | ORAL_TABLET | Freq: Four times a day (QID) | ORAL | Status: DC | PRN

## 2015-08-04 MED ORDER — ISOSORBIDE MONONITRATE ER 60 MG PO TB24
30.0000 mg | ORAL_TABLET | Freq: Every day | ORAL | Status: DC
  Administered 2015-08-04 – 2015-08-08 (×5): 30 mg via ORAL
  Filled 2015-08-04 (×5): qty 1

## 2015-08-04 MED ORDER — HEPARIN SODIUM (PORCINE) 1000 UNIT/ML DIALYSIS: 1000.0000 [IU] | INTRAMUSCULAR | Status: DC | PRN

## 2015-08-04 NOTE — Progress Notes (Signed)
Received critical troponin of 0.71, patient in dialysis, [place] epaged, returned call, patient not complaining of chest pain, Legrand Rams stated will repeat Troponin in AM.  Will continue to monitor patient.

## 2015-08-04 NOTE — Procedures (Signed)
   HEMODIALYSIS TREATMENT NOTE:  3.5 hour heparin-free dialysis completed via left forearm AVF (16g/antegrade). Goal NOT met; BP unable to tolerate removal of 3 liters as ordered. Ultrafiltration was repeatedly interrupted or adjusted for symptomatic hypotension and muscle cramping. Net UF 1.9 liters. All blood was reinfused. Hemostasis was achieved within 15 minutes. Report called to [place], Therapist, sports.  Rockwell Alexandria, RN, CDN

## 2015-08-04 NOTE — Consult Note (Signed)
Reason for Consult: End-stage renal disease Referring Physician: Dr. [name] is an 81 y.o. male.  HPI: The patient was history of hypertension, peripheral vascular disease, end-stage renal disease on maintenance hemodialysis presently came in with substernal chest pain since last night. This is also associated with some difficulty breathing. He denies any orthopnea or paroxysmal nocturnal dyspnea. The pain is non-radiating. Described as sharp and pressure-like. Presently is feeling much better. She denies any nausea no vomiting and her appetite is good. His last dialysis was on Wednesday and he is due for dialysis today.  Past Medical History  Diagnosis Date  . Hypercholesterolemia   . Gout   . Nonischemic cardiomyopathy (HCC)     LVEF 35-40%  . Anemia of chronic disease   . Essential hypertension   . Arthritis   . Peripheral vascular disease ([place])     Status post right below knee amputation for a nonhealing wound of the right foot 12/2014  . History of pneumonia 2014  . History of myopericarditis 2015  . ESRD on hemodialysis [facility])     M/W/F in [place] - Dr. Florene Glen  . CAD (coronary artery disease)     Moderate multivessel disease 07/2015 - managed medically    Past Surgical History  Procedure Laterality Date  . Knee arthroscopy Right 2007  . Back surgery    . Hemorrhoid surgery  1970's  . Ganglion cyst excision  01/03/2012    Procedure: REMOVAL GANGLION OF WRIST;  Surgeon: Scherry Ran, MD;  Location: [facility];  Service: General;  Laterality: Right;  . Colonoscopy    . Av fistula placement  08/24/2012    Procedure: ARTERIOVENOUS (AV) FISTULA CREATION;  Surgeon: Rosetta Posner, MD;  Location: [place];  Service: Vascular;  Laterality: Left;  . Insertion of dialysis catheter Right      neck  . Insertion of dialysis catheter  10/19/2012    Procedure: INSERTION OF DIALYSIS CATHETER;  Surgeon: Rosetta Posner, MD;  Location: [place];  Service: Vascular;  Laterality: N/A;   REMOVE TEMPORARY CATH  . Cholecystectomy    . Lumbar disc surgery  2004  . Left heart catheterization with coronary angiogram N/A 08/17/2014    Procedure: LEFT HEART CATHETERIZATION WITH CORONARY ANGIOGRAM;  Surgeon: Larey Dresser, MD;  Location: [facility] CATH LAB;  Service: Cardiovascular;  Laterality: N/A;  . Colonoscopy Left 09/26/2014    Procedure: COLONOSCOPY;  Surgeon: Arta Silence, MD;  Location: [facility] ENDOSCOPY;  Service: Endoscopy;  Laterality: Left;  . Amputation Right 01/05/2015    Procedure: AMPUTATION BELOW KNEE;  Surgeon: Angelia Mould, MD;  Location: [place];  Service: Vascular;  Laterality: Right;  . Capd removal N/A 03/13/2015    Procedure: CONTINUOUS AMBULATORY PERITONEAL DIALYSIS  (CAPD) CATHETER REMOVAL;  Surgeon: Coralie Keens, MD;  Location: [place];  Service: General;  Laterality: N/A;    Family History  Problem Relation Age of Onset  . Arthritis    . Cancer    . Kidney disease    . Anesthesia problems Neg Hx   . Hypotension Neg Hx   . Malignant hyperthermia Neg Hx   . Pseudochol deficiency Neg Hx   . Cancer Sister   . Colon cancer Brother   . Colon cancer Brother     Social History:  reports that he quit smoking about 16 years ago. His smoking use included Cigarettes. He has a 30 pack-year smoking history. He quit smokeless tobacco use about 21 years ago. His  smokeless tobacco use included Chew. He reports that he does not drink alcohol or use illicit drugs.  Allergies: No Known Allergies  Medications: I have reviewed the patient's current medications.  Results for orders placed or performed during the hospital encounter of 08/04/15 (from the past 48 hour(s))  CBC with Differential     Status: Abnormal   Collection Time: 08/04/15  6:25 AM  Result Value Ref Range   WBC 8.9 4.0 - 10.5 K/uL   RBC 3.57 (L) 4.22 - 5.81 MIL/uL   Hemoglobin 11.3 (L) 13.0 - 17.0 g/dL   HCT 36.3 (L) 39.0 - 52.0 %   MCV 101.7 (H) 78.0 - 100.0 fL   MCH 31.7 26.0 - 34.0 pg   MCHC  31.1 30.0 - 36.0 g/dL   RDW 19.7 (H) 11.5 - 15.5 %   Platelets 274 150 - 400 K/uL   Neutrophils Relative % 75 %   Neutro Abs 6.7 1.7 - 7.7 K/uL   Lymphocytes Relative 12 %   Lymphs Abs 1.0 0.7 - 4.0 K/uL   Monocytes Relative 9 %   Monocytes Absolute 0.8 0.1 - 1.0 K/uL   Eosinophils Relative 4 %   Eosinophils Absolute 0.4 0.0 - 0.7 K/uL   Basophils Relative 0 %   Basophils Absolute 0.0 0.0 - 0.1 K/uL  Troponin I     Status: Abnormal   Collection Time: 08/04/15  6:25 AM  Result Value Ref Range   Troponin I 0.08 (H) <0.031 ng/mL    Comment:        PERSISTENTLY INCREASED TROPONIN VALUES IN THE RANGE OF 0.04-0.49 ng/mL CAN BE SEEN IN:       -UNSTABLE ANGINA       -CONGESTIVE HEART FAILURE       -MYOCARDITIS       -CHEST TRAUMA       -ARRYHTHMIAS       -LATE PRESENTING MYOCARDIAL INFARCTION       -COPD   CLINICAL FOLLOW-UP RECOMMENDED.   Basic metabolic panel     Status: Abnormal   Collection Time: 08/04/15  6:25 AM  Result Value Ref Range   Sodium 138 135 - 145 mmol/L   Potassium 5.1 3.5 - 5.1 mmol/L   Chloride 94 (L) 101 - 111 mmol/L   CO2 29 22 - 32 mmol/L   Glucose, Bld 126 (H) 65 - 99 mg/dL   BUN 61 (H) 6 - 20 mg/dL   Creatinine, Ser 8.78 (H) 0.61 - 1.24 mg/dL   Calcium 10.0 8.9 - 10.3 mg/dL   GFR calc non Af Amer 5 (L) >60 mL/min   GFR calc Af Amer 6 (L) >60 mL/min    Comment: (NOTE) The eGFR has been calculated using the CKD EPI equation. This calculation has not been validated in all clinical situations. eGFR's persistently <60 mL/min signify possible Chronic Kidney Disease.    Anion gap 15 5 - 15  Brain natriuretic peptide     Status: Abnormal   Collection Time: 08/04/15  6:25 AM  Result Value Ref Range   B Natriuretic Peptide 2452.0 (H) 0.0 - 100.0 pg/mL  Troponin I (q 6hr x 3)     Status: Abnormal   Collection Time: 08/04/15 11:02 AM  Result Value Ref Range   Troponin I 0.26 (H) <0.031 ng/mL    Comment:        PERSISTENTLY INCREASED TROPONIN VALUES IN  THE RANGE OF 0.04-0.49 ng/mL CAN BE SEEN IN:       -UNSTABLE  ANGINA       -CONGESTIVE HEART FAILURE       -MYOCARDITIS       -CHEST TRAUMA       -ARRYHTHMIAS       -LATE PRESENTING MYOCARDIAL INFARCTION       -COPD   CLINICAL FOLLOW-UP RECOMMENDED.     Dg Chest 2 View  08/04/2015  CLINICAL DATA:  Chest pain and burning starting around midnight. EXAM: CHEST  2 VIEW COMPARISON:  03/13/2015 FINDINGS: Small left pleural effusion which has decreased from 03/13/2015. Chronic interstitial coarsening without overt pulmonary edema. No pneumothorax. Chronic cardiomegaly and stable aortic tortuosity. IMPRESSION: 1. Chronic left pleural effusion, small today and decreased from most recent comparison. 2. Chronic pulmonary venous congestion. Electronically Signed   By: Monte Fantasia M.D.   On: 08/04/2015 07:03    Review of Systems  Constitutional: Negative for fever and chills.  Respiratory: Positive for shortness of breath. Negative for hemoptysis and sputum production.   Cardiovascular: Positive for chest pain. Negative for orthopnea and PND.  Gastrointestinal: Negative for nausea, vomiting and abdominal pain.  Neurological: Positive for weakness.   Blood pressure 178/85, pulse 90, temperature 97.7 F (36.5 C), temperature source Oral, resp. rate 20, height _0  (1.753 m), weight 135 lb (61.236 kg), SpO2 99 %. Physical Exam  Constitutional: No distress.  HENT:  Mouth/Throat: No oropharyngeal exudate.  Eyes: No scleral icterus.  Neck: JVD present.  Cardiovascular: Normal rate and regular rhythm.   No murmur heard. Respiratory: No respiratory distress. He has no wheezes. He has no rales.  GI: He exhibits no distension. There is no tenderness.  Musculoskeletal: He exhibits no edema.    Assessment/Plan: Problem #1 chest pain: Possibly noncardiac. Patient with multiple risk factor and is evaluated by cardiology. Presently his pain is better. His troponin is slightly high and patient with  multiple risk factors. Problem #2 end-stage renal disease: He is status post hemodialysis on Wednesday. Patient does not have any uremic signs and symptoms and his potassium is normal. Problem #3 anemia: His hemoglobin and hematocrit is within our target goal Problem #4 hypertension: His blood pressure is somewhat high Problem #5 history of peripheral vascular disease: He is status post right BKA Problem #6 some difficulty in breathing associated with the chest pain. Patient with pleural effusion and cardio megaly. But does not have any pulmonary edema. Problem #7 metabolic bone disease: His calcium is range. Plan 1]: We'll make arrangements for patient to get dialysis and a half hours 2] We'll try to remove about 3 L if his blood pressure tolerates 3] We'll check his basic metabolic panel and phosphorus in the morning.  Dinorah Masullo S 08/04/2015, 12:37 PM

## 2015-08-04 NOTE — H&P (Addendum)
[facility] History and Physical  Randy [name] [id number] DOB: Sep 17, 1932 DOA: 08/04/2015  Referring physician: Emergency Department PCP: Randy Curet, MD   Chief Complaint: Chest pain  HPI: Randy [name] is a 81 y.o. male with a hx of ESRD on MWF HD, PVD, known CAD, HLD, HTN who presents to the ED with substernal chest pains that began on the evening prior to admission. Pt reports increased SOB, diaphoresis, nausea, feelings of near syncope. In the ED, pt was noted to have mildly elevated trop of 0.08. EKG noted to have T wave inversions at anterolateral leads, although were present on prior older EKG's. Given pt's presenting chest pains in the setting of known risk factors, Hospitalist was consulted for consideration for admission.  On further questioning, pt reports feeling sudden sob while laying down overnight followed by sob, nausea with dry heaving, and diaphoresis. Presently asymptomatic.   Review of Systems:  Review of Systems  Constitutional: Negative for fever and chills.  HENT: Negative for ear pain and tinnitus.   Eyes: Negative for pain and discharge.  Respiratory: Positive for shortness of breath. Negative for wheezing.   Cardiovascular: Positive for chest pain. Negative for orthopnea.  Gastrointestinal: Negative for abdominal pain and blood in stool.  Genitourinary: Positive for hematuria. Negative for flank pain.  Musculoskeletal: Negative for back pain and joint pain.  Neurological: Negative for tremors and loss of consciousness.  Psychiatric/Behavioral: Negative for hallucinations and memory loss.     Past Medical History  Diagnosis Date  . Hypercholesterolemia   . Gout   . Secondary cardiomyopathy (HCC)     LVEF 40-45%  . Anemia of chronic disease   . Hypertension   . Arthritis   . Peripheral vascular disease ([place])   . Pneumonia 09/2012  . Pericarditis 08/17/2014  . NSTEMI (non-ST elevated myocardial infarction) ([place]) 08/17/2014  .  ESRD on peritoneal dialysis ([place])     M/W/F in [place]  . Shortness of breath dyspnea     pt. reports that he uses O2 as needed at home.   . Paresthesias     left foot   Past Surgical History  Procedure Laterality Date  . Knee arthroscopy Right 2007  . Back surgery    . Hemorrhoid surgery  1970's  . Ganglion cyst excision  01/03/2012    Procedure: REMOVAL GANGLION OF WRIST;  Surgeon: Scherry Ran, MD;  Location: [facility];  Service: General;  Laterality: Right;  . Colonoscopy    . Av fistula placement  08/24/2012    Procedure: ARTERIOVENOUS (AV) FISTULA CREATION;  Surgeon: Rosetta Posner, MD;  Location: [place];  Service: Vascular;  Laterality: Left;  . Insertion of dialysis catheter Right      neck  . Insertion of dialysis catheter  10/19/2012    Procedure: INSERTION OF DIALYSIS CATHETER;  Surgeon: Rosetta Posner, MD;  Location: [place];  Service: Vascular;  Laterality: N/A;  REMOVE TEMPORARY CATH  . Cardiac catheterization  08/17/2014  . Cholecystectomy    . Lumbar disc surgery  2004  . Left heart catheterization with coronary angiogram N/A 08/17/2014    Procedure: LEFT HEART CATHETERIZATION WITH CORONARY ANGIOGRAM;  Surgeon: Larey Dresser, MD;  Location: [facility] CATH LAB;  Service: Cardiovascular;  Laterality: N/A;  . Colonoscopy Left 09/26/2014    Procedure: COLONOSCOPY;  Surgeon: Arta Silence, MD;  Location: [facility] ENDOSCOPY;  Service: Endoscopy;  Laterality: Left;  . Amputation Right 01/05/2015    Procedure: AMPUTATION BELOW KNEE;  Surgeon: Harrell Gave  Randy Cella, MD;  Location: [place];  Service: Vascular;  Laterality: Right;  . Capd removal N/A 03/13/2015    Procedure: CONTINUOUS AMBULATORY PERITONEAL DIALYSIS  (CAPD) CATHETER REMOVAL;  Surgeon: Coralie Keens, MD;  Location: [place];  Service: General;  Laterality: N/A;   Social History:  reports that he quit smoking about 16 years ago. His smoking use included Cigarettes. He has a 30 pack-year smoking history. He quit smokeless tobacco use  about 21 years ago. His smokeless tobacco use included Chew. He reports that he does not drink alcohol or use illicit drugs.  No Known Allergies  Family History  Problem Relation Age of Onset  . Arthritis    . Cancer    . Kidney disease    . Anesthesia problems Neg Hx   . Hypotension Neg Hx   . Malignant hyperthermia Neg Hx   . Pseudochol deficiency Neg Hx   . Cancer Sister   . Cancer Brother     colon  . Cancer Brother     colon    do not leave blank  Prior to Admission medications   Medication Sig Start Date End Date Taking? Authorizing Provider  amLODipine (NORVASC) 10 MG tablet Take 1 tablet (10 mg total) by mouth daily. 06/23/14   Lucia Gaskins, MD  aspirin 81 MG tablet Take 81 mg by mouth daily.    Historical Provider, MD  atorvastatin (LIPITOR) 40 MG tablet Take 1 tablet (40 mg total) by mouth daily at 6 PM. 01/13/15   Barton Dubois, MD  calcitRIOL (ROCALTROL) 0.5 MCG capsule Take 1 capsule (0.5 mcg total) by mouth daily. 06/23/14   Lucia Gaskins, MD  carvedilol (COREG) 25 MG tablet Take 1 tablet (25 mg total) by mouth 2 (two) times daily with a meal. Patient taking differently: Take 25 mg by mouth daily.  01/13/15   Barton Dubois, MD  cloNIDine (CATAPRES) 0.2 MG tablet Take 0.2 mg by mouth 2 (two) times daily as needed (blood pressure >130/27).  12/27/14   Historical Provider, MD  COLCRYS 0.6 MG tablet Take 1 tablet by mouth daily as needed (gout flare up).  10/31/14   Historical Provider, MD  doxycycline (VIBRA-TABS) 100 MG tablet Take 1 tablet (100 mg total) by mouth every 12 (twelve) hours. 01/13/15   Barton Dubois, MD  feeding supplement, RESOURCE BREEZE, (RESOURCE BREEZE) LIQD Take 1 Container by mouth 3 (three) times daily between meals. 09/30/14   Annita Brod, MD  gabapentin (NEURONTIN) 100 MG capsule Take 1 capsule (100 mg total) by mouth 3 (three) times daily. Patient taking differently: Take 100 mg by mouth at bedtime.  01/22/15   Leonard Schwartz, MD  hydrALAZINE  (APRESOLINE) 25 MG tablet Take 1 tablet (25 mg total) by mouth 3 (three) times daily. Patient taking differently: Take 25 mg by mouth 3 (three) times daily as needed (High blood pressure).  08/23/14   Rhonda G Barrett, PA-C  labetalol (NORMODYNE) 100 MG tablet Take 1 tablet (100 mg total) by mouth 2 (two) times daily. Patient taking differently: Take 100 mg by mouth 2 (two) times daily as needed (high blood pressure).  01/13/15   Barton Dubois, MD  lisinopril (PRINIVIL,ZESTRIL) 10 MG tablet Take 1 tablet by mouth daily. 01/24/15   Historical Provider, MD  multivitamin (RENA-VIT) TABS tablet Take 1 tablet by mouth daily.    Historical Provider, MD  oxyCODONE-acetaminophen (PERCOCET/ROXICET) 5-325 MG per tablet Take 1 tablet by mouth every 6 (six) hours as needed for severe pain. 03/14/15  Coralie Keens, MD  pantoprazole (PROTONIX) 40 MG tablet Take 1 tablet (40 mg total) by mouth daily at 12 noon. Patient taking differently: Take 40 mg by mouth daily as needed (acid reflux).  08/23/14   Rhonda G Barrett, PA-C  predniSONE (DELTASONE) 20 MG tablet Take 2 tablets by mouth daily X 2 days; then 1 tablet by mouth daily X 2 days; then 1/2 tablet by mouth daily X 3 days and stop prednisone 01/13/15   Barton Dubois, MD  rOPINIRole (REQUIP) 0.25 MG tablet Take 0.25-0.5 mg by mouth at bedtime as needed (restless legs). 1-2 tablets based on severity of restless legs    Historical Provider, MD  sevelamer carbonate (RENVELA) 800 MG tablet Take 1,600-2,400 mg by mouth See admin instructions. Take 3 tablets (2400 mg) with meals and 2 tablets (1600 mg) with snacks    Historical Provider, MD   Physical Exam: Filed Vitals:   08/04/15 0630 08/04/15 0700 08/04/15 0730 08/04/15 0800  BP: 178/99 173/91 192/94 185/112  Pulse: 88 86 89 91  Temp:      TempSrc:      Resp: 18 19 23 23   Height:      Weight:      SpO2: 100% 100% 100% 100%    Wt Readings from Last 3 Encounters:  08/04/15 61.236 kg (135 lb)  06/28/15 61.236  kg (135 lb)  03/24/15 75.751 kg (167 lb)    General:  Appears calm and comfortable Eyes: PERRL, normal lids, irises & conjunctiva ENT: grossly normal hearing, lips & tongue Neck: no LAD, masses or thyromegaly Cardiovascular: RRR, no m/r/g. No LE edema. Telemetry: SR, no arrhythmias  Respiratory: CTA bilaterally, no w/r/r. Normal respiratory effort. Abdomen: soft, ntnd Skin: no rash or induration seen on limited exam Musculoskeletal: grossly normal tone BUE/BLE Psychiatric: grossly normal mood and affect, speech fluent and appropriate Neurologic: grossly non-focal.          Labs on Admission:  Basic Metabolic Panel:  Recent Labs Lab 08/04/15 0625  NA 138  K 5.1  CL 94*  CO2 29  GLUCOSE 126*  BUN 61*  CREATININE 8.78*  CALCIUM 10.0   Liver Function Tests: No results for input(s): AST, ALT, ALKPHOS, BILITOT, PROT, ALBUMIN in the last 168 hours. No results for input(s): LIPASE, AMYLASE in the last 168 hours. No results for input(s): AMMONIA in the last 168 hours. CBC:  Recent Labs Lab 08/04/15 0625  WBC 8.9  NEUTROABS 6.7  HGB 11.3*  HCT 36.3*  MCV 101.7*  PLT 274   Cardiac Enzymes:  Recent Labs Lab 08/04/15 0625  TROPONINI 0.08*    BNP (last 3 results)  Recent Labs  01/08/15 1118 08/04/15 0625  BNP >4500.0* 2452.0*    ProBNP (last 3 results)  Recent Labs  08/17/14 0625 08/29/14 0928  PROBNP >70000.0* >70000.0*    CBG: No results for input(s): GLUCAP in the last 168 hours.  Radiological Exams on Admission: Dg Chest 2 View  08/04/2015  CLINICAL DATA:  Chest pain and burning starting around midnight. EXAM: CHEST  2 VIEW COMPARISON:  03/13/2015 FINDINGS: Small left pleural effusion which has decreased from 03/13/2015. Chronic interstitial coarsening without overt pulmonary edema. No pneumothorax. Chronic cardiomegaly and stable aortic tortuosity. IMPRESSION: 1. Chronic left pleural effusion, small today and decreased from most recent  comparison. 2. Chronic pulmonary venous congestion. Electronically Signed   By: Monte Fantasia M.D.   On: 08/04/2015 07:03    EKG: Independently reviewed. T wave inversions at V4-6, also noted  on prior older ekg's  Assessment/Plan Principal Problem:   Chest pain Active Problems:   HLD (hyperlipidemia)   Secondary cardiomyopathy (HCC)   End stage renal disease, has been peritoneal, now hemodialysis   Elevated troponin   ESRD (end stage renal disease) on dialysis (HCC)   CAD (coronary artery disease)   1. Chest Pain 1. Known PVD and CAD with up to 80% disease seen on 2015 cath. Pt reports not following with Cardiologist 2. Presenting chest "burning" with diaphoresis, nausea, sob, feelings of near syncope 3. Stable and chest pain free at present 4. Will cont analgesics and ntg as needed 5. Initial trop 0.08, however is in the setting of ESRD. Will follow trends 6. Given cardiac risk factors, will consult Cardiology for further assistance. Will keep NPO incase intervention is recommended 7. Admit to med-tele 2. ESRD 1. Due for HD today 2. Will consult Nephrology 3. Presently euvolemic and with stable electrolytes 3. CAD 1. Per above, known CAD with cath done one year prior with up to 80% disease noted 4. HLD 1. Cont statin per home regimen 5. HTN 1. BP suboptimally controlled currently 2. Will resume home meds, plans to titrate accordingly 3. Will add PRN IV hydralazine for added BP control 6. DVT prophylaxis 1. Heparin subQ   Code Status: Full  DVT Prophylaxis: Heparin Family Communication: Pt in room  Disposition Plan: Admit med-tele, obs    Jayelle Page, [facility] Pager [phone]

## 2015-08-04 NOTE — Progress Notes (Signed)
*  PRELIMINARY RESULTS* Echocardiogram 2D Echocardiogram has been performed.  Randy [name] 08/04/2015, 2:45 PM

## 2015-08-04 NOTE — ED Notes (Signed)
Pt reporting burning pain in mid chest began aprox 6 hours ago.  Pt states "I feel like I might have too much fluid on".  Pt is scheduled for dialysis this morning.

## 2015-08-04 NOTE — Consult Note (Signed)
Primary cardiologist: Dr. Satira Sark Consulting cardiologist: Dr. Satira Sark  Reason for consultation: Chest pain  Clinical Summary Randy [name] is a medically complex 81 y.o.male with past medical history outlined below, admitted to the hospital today reporting recent feeling of chest tightness in the early morning hours. He states that he has not been having chest discomfort with any regularity, reports compliance with his medications. He undergoes hemodialysis on Monday, Wednesday, and Friday. Does state that his sessions have been limited, he has to stop due to leg cramps. Feels like his "fluid" may have built up somewhat. Indicates a dry weight however in the mid 130s without any substantial weight gain.  I have not seen him in the office since 2013, subsequent interval history reviewed and chart updated below. He has known moderate multivessel CAD that has been managed medically based on cardiac catheterization from last year. Also nonischemic cardiomyopathy with most recent LVEF 35-40% range, mild aortic stenosis as of a year ago, and history of myopericarditis with abnormal cardiac markers. He has significant PAD and had a right BKA in April of this year due to poorly healing right foot wound.  His troponin I 0.08, ECG abnormal as detailed below, but largely chronic changes with accentuation of ST segment abnormalities.   No Known Allergies  Medications Scheduled Medications: . amLODipine  10 mg Oral Daily  . aspirin  81 mg Oral Daily  . atorvastatin  40 mg Oral q1800  . calcitRIOL  0.5 mcg Oral Daily  . carvedilol  25 mg Oral BID WC  . heparin  5,000 Units Subcutaneous 3 times per day  . lisinopril  10 mg Oral Daily  . sodium chloride  3 mL Intravenous Q12H    PRN Medications: acetaminophen **OR** acetaminophen, cloNIDine, hydrALAZINE, morphine injection, nitroGLYCERIN, oxyCODONE-acetaminophen, rOPINIRole   Past Medical History  Diagnosis Date  .  Hypercholesterolemia   . Gout   . Nonischemic cardiomyopathy (HCC)     LVEF 35-40%  . Anemia of chronic disease   . Essential hypertension   . Arthritis   . Peripheral vascular disease ([place])     Status post right below knee amputation for a nonhealing wound of the right foot 12/2014  . History of pneumonia 2014  . History of myopericarditis 2015  . ESRD on hemodialysis [facility])     M/W/F in [place] - Dr. Florene Glen  . CAD (coronary artery disease)     Moderate multivessel disease 07/2015 - managed medically    Past Surgical History  Procedure Laterality Date  . Knee arthroscopy Right 2007  . Back surgery    . Hemorrhoid surgery  1970's  . Ganglion cyst excision  01/03/2012    Procedure: REMOVAL GANGLION OF WRIST;  Surgeon: Scherry Ran, MD;  Location: [facility];  Service: General;  Laterality: Right;  . Colonoscopy    . Av fistula placement  08/24/2012    Procedure: ARTERIOVENOUS (AV) FISTULA CREATION;  Surgeon: Rosetta Posner, MD;  Location: [place];  Service: Vascular;  Laterality: Left;  . Insertion of dialysis catheter Right      neck  . Insertion of dialysis catheter  10/19/2012    Procedure: INSERTION OF DIALYSIS CATHETER;  Surgeon: Rosetta Posner, MD;  Location: [place];  Service: Vascular;  Laterality: N/A;  REMOVE TEMPORARY CATH  . Cholecystectomy    . Lumbar disc surgery  2004  . Left heart catheterization with coronary angiogram N/A 08/17/2014    Procedure: LEFT HEART CATHETERIZATION  WITH CORONARY ANGIOGRAM;  Surgeon: Larey Dresser, MD;  Location: [facility] CATH LAB;  Service: Cardiovascular;  Laterality: N/A;  . Colonoscopy Left 09/26/2014    Procedure: COLONOSCOPY;  Surgeon: Arta Silence, MD;  Location[facility] ENDOSCOPY;  Service: Endoscopy;  Laterality: Left;  . Amputation Right 01/05/2015    Procedure: AMPUTATION BELOW KNEE;  Surgeon: Angelia Mould, MD;  Location: [place];  Service: Vascular;  Laterality: Right;  . Capd removal N/A 03/13/2015    Procedure: CONTINUOUS AMBULATORY  PERITONEAL DIALYSIS  (CAPD) CATHETER REMOVAL;  Surgeon: Coralie Keens, MD;  Location: [place];  Service: General;  Laterality: N/A;    Family History  Problem Relation Age of Onset  . Arthritis    . Cancer    . Kidney disease    . Anesthesia problems Neg Hx   . Hypotension Neg Hx   . Malignant hyperthermia Neg Hx   . Pseudochol deficiency Neg Hx   . Cancer Sister   . Colon cancer Brother   . Colon cancer Brother     Social History Mr. Hartter reports that he quit smoking about 16 years ago. His smoking use included Cigarettes. He has a 30 pack-year smoking history. He quit smokeless tobacco use about 21 years ago. His smokeless tobacco use included Chew. Mr. Waffle reports that he does not drink alcohol.  Review of Systems Complete review of systems negative except as otherwise outlined in the clinical summary and also the following. Appetite has been good. No palpitations or syncope. No left leg swelling.  Physical Examination Blood pressure 178/85, pulse 90, temperature 97.7 F (36.5 C), temperature source Oral, resp. rate 20, height 5\' 9"  (1.753 m), weight 135 lb (61.236 kg), SpO2 99 %.  Telemetry: Sinus rhythm.  Gen.: Pleasant elderly male, no distress or active chest pain. HEENT: Conjunctiva and lids normal, oropharynx clear. Neck: Supple, no elevated JVP, no thyromegaly. Lungs: Scattered rhonchi with diminished breath sounds at left base, nonlabored breathing at rest. Cardiac: Regular rate and rhythm, no S3, 3/6 basal systolic murmur, no pericardial rub. Abdomen: Soft, nontender, bowel sounds present, no guarding or rebound. Extremities: Status post right BKA, no left leg edema, distal pulses 1+. Skin: Warm and dry. Musculoskeletal: No kyphosis. Neuropsychiatric: Alert and oriented x3, affect grossly appropriate.  Lab Results  Basic Metabolic Panel:  Recent Labs Lab 08/04/15 0625  NA 138  K 5.1  CL 94*  CO2 29  GLUCOSE 126*  BUN 61*  CREATININE 8.78*  CALCIUM  10.0    CBC:  Recent Labs Lab 08/04/15 0625  WBC 8.9  NEUTROABS 6.7  HGB 11.3*  HCT 36.3*  MCV 101.7*  PLT 274    Cardiac Enzymes:  Recent Labs Lab 08/04/15 0625  TROPONINI 0.08*    BNP: 2452  ECG Sinus tachycardia with occasional fusion beats, LVH with IVCD and significant ST segment repolarization abnormalities, ischemia not excluded.  Imaging  Echocardiogram 08/30/2014: Study Conclusions  - This is a limited study to evalute LV function and pericardial effusion. - Left ventricle: The cavity size was normal. Wall thickness was increased in a pattern of mild LVH. Systolic function was moderately reduced. The estimated ejection fraction was in the range of 35% to 40%. Diffuse hypokinesis. Doppler parameters are consistent with restrictive physiology, indicative of decreased left ventricular diastolic compliance and/or increased left atrial pressure. - Aortic valve: There was mild stenosis. Valve area (VTI): 1.54 cm^2. Valve area (Vmax): 1.44 cm^2. Valve area (Vmean): 1.4 cm^2. - Pulmonary arteries: Systolic pressure was moderately  increased. PA peak pressure: 59 mm Hg (S). - Systemic veins: IVC is dilated with normal respiratory variation, esimated RA pressure is 8 mmHg. - Pericardium, extracardiac: There is a large left pleural effusion. A trivial circumferential pericardial effusion was identified.  Chest x-ray 08/04/2015: FINDINGS: Small left pleural effusion which has decreased from 03/13/2015.  Chronic interstitial coarsening without overt pulmonary edema. No pneumothorax. Chronic cardiomegaly and stable aortic tortuosity.  IMPRESSION: 1. Chronic left pleural effusion, small today and decreased from most recent comparison. 2. Chronic pulmonary venous congestion.  Cardiac catheterization 08/17/2014: Coronary dominance: right  Left mainstem: Short, no significant disease.   Left anterior descending (LAD): There was a  moderate D1 that branched early with 40% ostial stenosis. This was followed by a large septal perforator. There was 40-50% proximal LAD stenosis at the takeoff of the septal perforator. 30% mid LAD stenosis at D2.   Left circumflex (LCx): There was a large ramus that branched early into a superior and and inferior division, both vessels were moderate in size. Both divisions of the ramus had relatively up to 80% proximal stenosis. The AV LCx itself had luminal irregularities.   Right coronary artery (RCA): There was an early large acute marginal with 40% proximal and 40% mid vessel stenosis. The RCA had diffuse luminal irregularities and up to 40% mid vessel stenosis.    Impression  1. Recent onset chest pain, rule out unstable angina although there may be a component of volume overload as well. Initial troponin I is somewhat equivocal at 0.08, particularly in the setting of chronic renal disease. ECG abnormal as outlined above.  2. Known history of moderate multivessel CAD by cardiac catheterization in November 2015.  3. Nonischemic cardiomyopathy, LVEF 35-40% as of December 2015.  4. Mild aortic stenosis as of December 2015. Cardiac murmur more prominent.  5. End stage renal disease on hemodialysis, followed by Dr. Florene Glen.  6. Essential hypertension, blood pressure trend has been up recently per patient. He reports compliance with his medications and hemodialysis.  Recommendations  Discussed with patient and wife. He is feeling better this morning. Would recommend continuing to cycle cardiac markers to better assess trend. Also follow-up echocardiogram to reevaluate LVEF and degree of aortic stenosis. He will be undergoing hemodialysis today for further volume removal. Depending on clinical course and further objective testing, can determine whether medical therapy adjustments make the most sense, versus consideration for cardiac catheterization if ACS seems more the case.  Satira Sark, M.D., F.A.C.C.

## 2015-08-04 NOTE — Progress Notes (Signed)
CRITICAL VALUE ALERT  Critical value received:  Troponin  Date of notification:  08/04/2015  Time of notification:  [id number]  Critical value read back:Yes.    Nurse who received alert:  Craige Cotta. Sherral Hammers  MD notified (1st page):  [place]  Time of first page:  [phone]  MD notified (2nd page):  Time of second page:  Responding MD:  Legrand Rams  Time MD responded:  [phone]

## 2015-08-04 NOTE — ED Provider Notes (Signed)
CSN: [id number]     Arrival date & time 08/04/15  0606 History   First MD Initiated Contact with Patient 08/04/15 [phone]     Chief Complaint  Patient presents with  . Chest Pain     (Consider location/radiation/quality/duration/timing/severity/associated sxs/prior Treatment) HPI Comments: Patient here complaining of substernal chest pain that began last night. Pain has been intermittent and associated diaphoresis and increased dyspnea. Patient has a history of renal failure and scheduled for dialysis today. Denies any fever or chills. No cough. Patient is unsure of how long the symptoms lasted for bradycardia is currently pain-free. Felt his blood pressure was elevated last night and did take an x-rayof hydralazine. Is unsure of the medication had any effect on his blood pressure.  Patient is a 81 y.o. male presenting with chest pain. The history is provided by the patient.  Chest Pain   Past Medical History  Diagnosis Date  . Hypercholesterolemia   . Gout   . Secondary cardiomyopathy (HCC)     LVEF 40-45%  . Anemia of chronic disease   . Hypertension   . Arthritis   . Peripheral vascular disease ([place])   . Pneumonia 09/2012  . Pericarditis 08/17/2014  . NSTEMI (non-ST elevated myocardial infarction) ([place]) 08/17/2014  . ESRD on peritoneal dialysis ([place])     M/W/F in [place]  . Shortness of breath dyspnea     pt. reports that he uses O2 as needed at home.   . Paresthesias     left foot   Past Surgical History  Procedure Laterality Date  . Knee arthroscopy Right 2007  . Back surgery    . Hemorrhoid surgery  1970's  . Ganglion cyst excision  01/03/2012    Procedure: REMOVAL GANGLION OF WRIST;  Surgeon: Scherry Ran, MD;  Location: [facility];  Service: General;  Laterality: Right;  . Colonoscopy    . Av fistula placement  08/24/2012    Procedure: ARTERIOVENOUS (AV) FISTULA CREATION;  Surgeon: Rosetta Posner, MD;  Location: [place];  Service: Vascular;  Laterality: Left;  .  Insertion of dialysis catheter Right      neck  . Insertion of dialysis catheter  10/19/2012    Procedure: INSERTION OF DIALYSIS CATHETER;  Surgeon: Rosetta Posner, MD;  Location: [place];  Service: Vascular;  Laterality: N/A;  REMOVE TEMPORARY CATH  . Cardiac catheterization  08/17/2014  . Cholecystectomy    . Lumbar disc surgery  2004  . Left heart catheterization with coronary angiogram N/A 08/17/2014    Procedure: LEFT HEART CATHETERIZATION WITH CORONARY ANGIOGRAM;  Surgeon: Larey Dresser, MD;  Location: [facility] CATH LAB;  Service: Cardiovascular;  Laterality: N/A;  . Colonoscopy Left 09/26/2014    Procedure: COLONOSCOPY;  Surgeon: Arta Silence, MD;  Location: [facility] ENDOSCOPY;  Service: Endoscopy;  Laterality: Left;  . Amputation Right 01/05/2015    Procedure: AMPUTATION BELOW KNEE;  Surgeon: Angelia Mould, MD;  Location: [place];  Service: Vascular;  Laterality: Right;  . Capd removal N/A 03/13/2015    Procedure: CONTINUOUS AMBULATORY PERITONEAL DIALYSIS  (CAPD) CATHETER REMOVAL;  Surgeon: Coralie Keens, MD;  Location: [place];  Service: General;  Laterality: N/A;   Family History  Problem Relation Age of Onset  . Arthritis    . Cancer    . Kidney disease    . Anesthesia problems Neg Hx   . Hypotension Neg Hx   . Malignant hyperthermia Neg Hx   . Pseudochol deficiency Neg Hx   . Cancer  Sister   . Cancer Brother     colon  . Cancer Brother     colon   Social History  Substance Use Topics  . Smoking status: Former Smoker -- 1.00 packs/day for 30 years    Types: Cigarettes    Quit date: 08/19/1998  . Smokeless tobacco: Former Systems developer    Types: Chew    Quit date: 12/31/1993  . Alcohol Use: No    Review of Systems  Cardiovascular: Positive for chest pain.  All other systems reviewed and are negative.     Allergies  Review of patient's allergies indicates no known allergies.  Home Medications   Prior to Admission medications   Medication Sig Start Date End Date Taking?  Authorizing Provider  amLODipine (NORVASC) 10 MG tablet Take 1 tablet (10 mg total) by mouth daily. 06/23/14   Lucia Gaskins, MD  aspirin 81 MG tablet Take 81 mg by mouth daily.    Historical Provider, MD  atorvastatin (LIPITOR) 40 MG tablet Take 1 tablet (40 mg total) by mouth daily at 6 PM. 01/13/15   Barton Dubois, MD  calcitRIOL (ROCALTROL) 0.5 MCG capsule Take 1 capsule (0.5 mcg total) by mouth daily. 06/23/14   Lucia Gaskins, MD  carvedilol (COREG) 25 MG tablet Take 1 tablet (25 mg total) by mouth 2 (two) times daily with a meal. Patient taking differently: Take 25 mg by mouth daily.  01/13/15   Barton Dubois, MD  cloNIDine (CATAPRES) 0.2 MG tablet Take 0.2 mg by mouth 2 (two) times daily as needed (blood pressure >130/27).  12/27/14   Historical Provider, MD  COLCRYS 0.6 MG tablet Take 1 tablet by mouth daily as needed (gout flare up).  10/31/14   Historical Provider, MD  doxycycline (VIBRA-TABS) 100 MG tablet Take 1 tablet (100 mg total) by mouth every 12 (twelve) hours. 01/13/15   Barton Dubois, MD  feeding supplement, RESOURCE BREEZE, (RESOURCE BREEZE) LIQD Take 1 Container by mouth 3 (three) times daily between meals. 09/30/14   Annita Brod, MD  gabapentin (NEURONTIN) 100 MG capsule Take 1 capsule (100 mg total) by mouth 3 (three) times daily. Patient taking differently: Take 100 mg by mouth at bedtime.  01/22/15   Leonard Schwartz, MD  hydrALAZINE (APRESOLINE) 25 MG tablet Take 1 tablet (25 mg total) by mouth 3 (three) times daily. Patient taking differently: Take 25 mg by mouth 3 (three) times daily as needed (High blood pressure).  08/23/14   Rhonda G Barrett, PA-C  labetalol (NORMODYNE) 100 MG tablet Take 1 tablet (100 mg total) by mouth 2 (two) times daily. Patient taking differently: Take 100 mg by mouth 2 (two) times daily as needed (high blood pressure).  01/13/15   Barton Dubois, MD  lisinopril (PRINIVIL,ZESTRIL) 10 MG tablet Take 1 tablet by mouth daily. 01/24/15   Historical Provider,  MD  multivitamin (RENA-VIT) TABS tablet Take 1 tablet by mouth daily.    Historical Provider, MD  oxyCODONE-acetaminophen (PERCOCET/ROXICET) 5-325 MG per tablet Take 1 tablet by mouth every 6 (six) hours as needed for severe pain. 03/14/15   Coralie Keens, MD  pantoprazole (PROTONIX) 40 MG tablet Take 1 tablet (40 mg total) by mouth daily at 12 noon. Patient taking differently: Take 40 mg by mouth daily as needed (acid reflux).  08/23/14   Rhonda G Barrett, PA-C  predniSONE (DELTASONE) 20 MG tablet Take 2 tablets by mouth daily X 2 days; then 1 tablet by mouth daily X 2 days; then 1/2 tablet by mouth daily  X 3 days and stop prednisone 01/13/15   Barton Dubois, MD  rOPINIRole (REQUIP) 0.25 MG tablet Take 0.25-0.5 mg by mouth at bedtime as needed (restless legs). 1-2 tablets based on severity of restless legs    Historical Provider, MD  sevelamer carbonate (RENVELA) 800 MG tablet Take 1,600-2,400 mg by mouth See admin instructions. Take 3 tablets (2400 mg) with meals and 2 tablets (1600 mg) with snacks    Historical Provider, MD   BP 173/91 mmHg  Pulse 86  Temp(Src) 97.9 F (36.6 C) (Oral)  Resp 19  Ht 5\' 9"  (1.753 m)  Wt 135 lb (61.236 kg)  BMI 19.93 kg/m2  SpO2 100% Physical Exam  Constitutional: He is oriented to person, place, and time. He appears well-developed and well-nourished.  Non-toxic appearance. No distress.  HENT:  Head: Normocephalic and atraumatic.  Eyes: Conjunctivae, EOM and lids are normal. Pupils are equal, round, and reactive to light.  Neck: Normal range of motion. Neck supple. No tracheal deviation present. No thyroid mass present.  Cardiovascular: Regular rhythm and normal heart sounds.  Tachycardia present.  Exam reveals no gallop.   No murmur heard. Pulmonary/Chest: Effort normal and breath sounds normal. No stridor. No respiratory distress. He has no decreased breath sounds. He has no wheezes. He has no rhonchi. He has no rales.  Abdominal: Soft. Normal appearance  and bowel sounds are normal. He exhibits no distension. There is no tenderness. There is no rebound and no CVA tenderness.  Musculoskeletal: Normal range of motion. He exhibits no edema or tenderness.  Neurological: He is alert and oriented to person, place, and time. He has normal strength. No cranial nerve deficit or sensory deficit. GCS eye subscore is 4. GCS verbal subscore is 5. GCS motor subscore is 6.  Skin: Skin is warm and dry. No abrasion and no rash noted.  Psychiatric: He has a normal mood and affect. His speech is normal and behavior is normal.  Nursing note and vitals reviewed.   ED Course  Procedures (including critical care time) Labs Review Labs Reviewed  CBC WITH DIFFERENTIAL/PLATELET - Abnormal; Notable for the following:    RBC 3.57 (*)    Hemoglobin 11.3 (*)    HCT 36.3 (*)    MCV 101.7 (*)    RDW 19.7 (*)    All other components within normal limits  TROPONIN I - Abnormal; Notable for the following:    Troponin I 0.08 (*)    All other components within normal limits  BASIC METABOLIC PANEL - Abnormal; Notable for the following:    Chloride 94 (*)    Glucose, Bld 126 (*)    BUN 61 (*)    Creatinine, Ser 8.78 (*)    GFR calc non Af Amer 5 (*)    GFR calc Af Amer 6 (*)    All other components within normal limits  BRAIN NATRIURETIC PEPTIDE    Imaging Review Dg Chest 2 View  08/04/2015  CLINICAL DATA:  Chest pain and burning starting around midnight. EXAM: CHEST  2 VIEW COMPARISON:  03/13/2015 FINDINGS: Small left pleural effusion which has decreased from 03/13/2015. Chronic interstitial coarsening without overt pulmonary edema. No pneumothorax. Chronic cardiomegaly and stable aortic tortuosity. IMPRESSION: 1. Chronic left pleural effusion, small today and decreased from most recent comparison. 2. Chronic pulmonary venous congestion. Electronically Signed   By: Monte Fantasia M.D.   On: 08/04/2015 07:03   I have personally reviewed and evaluated these images  and lab results as part  of my medical decision-making.   EKG Interpretation None      MDM   Final diagnoses:  None    Patient's elevated troponin noted and he appears to have chronically elevated troponins. He is pain-free at this time. Chest x-ray without acute signs of CHF. Potassium here is stable. EKG shows increased heart rate from baseline. Will consult hospitalist for admission due to his chest pain is was significant risk factors    Lacretia Leigh, MD 08/04/15 [phone]

## 2015-08-04 NOTE — ED Notes (Signed)
Pt reports burning chest pain that started approx 6 hours ago, states his blood pressure is high, supposed to have dialysis this am.

## 2015-08-04 NOTE — Progress Notes (Signed)
Down via wheelchair to 2nd floor for dilalysis, Telemetry in place, central telemetry notified.

## 2015-08-05 LAB — COMPREHENSIVE METABOLIC PANEL
ALK PHOS: 71 U/L (ref 38–126)
ALT: 12 U/L — AB (ref 17–63)
AST: 22 U/L (ref 15–41)
Albumin: 3.1 g/dL — ABNORMAL LOW (ref 3.5–5.0)
Anion gap: 10 (ref 5–15)
BILIRUBIN TOTAL: 0.4 mg/dL (ref 0.3–1.2)
BUN: 39 mg/dL — AB (ref 6–20)
CO2: 28 mmol/L (ref 22–32)
CREATININE: 6.53 mg/dL — AB (ref 0.61–1.24)
Calcium: 8.9 mg/dL (ref 8.9–10.3)
Chloride: 96 mmol/L — ABNORMAL LOW (ref 101–111)
GFR calc Af Amer: 8 mL/min — ABNORMAL LOW (ref >60)
GFR, EST NON AFRICAN AMERICAN: 7 mL/min — AB (ref >60)
GLUCOSE: 83 mg/dL (ref 65–99)
Potassium: 4.4 mmol/L (ref 3.5–5.1)
Sodium: 134 mmol/L — ABNORMAL LOW (ref 135–145)
TOTAL PROTEIN: 6.5 g/dL (ref 6.5–8.1)

## 2015-08-05 LAB — CBC
HCT: 32.3 % — ABNORMAL LOW (ref 39.0–52.0)
Hemoglobin: 10.2 g/dL — ABNORMAL LOW (ref 13.0–17.0)
MCH: 31.9 pg (ref 26.0–34.0)
MCHC: 31.6 g/dL (ref 30.0–36.0)
MCV: 100.9 fL — AB (ref 78.0–100.0)
PLATELETS: 228 10*3/uL (ref 150–400)
RBC: 3.2 MIL/uL — ABNORMAL LOW (ref 4.22–5.81)
RDW: 20 % — AB (ref 11.5–15.5)
WBC: 7.3 10*3/uL (ref 4.0–10.5)

## 2015-08-05 LAB — PHOSPHORUS: Phosphorus: 4.9 mg/dL — ABNORMAL HIGH (ref 2.5–4.6)

## 2015-08-05 NOTE — Progress Notes (Signed)
And denies any chest pain states his chest pain was burning in nature upon admission he has severe complex coronary artery disease with an ischemic cardiomyopathy he had dialysis yesterday tolerating food well Randy [name] [id number] DOB: [birth year] DOA: 08/04/2015 PCP: [name], MD             Physical Exam: Blood pressure 148/72, pulse 79, temperature 98 F (36.7 C), temperature source Oral, resp. rate 18, height 5\' 9"  (1.753 m), weight 138 lb 10.7 oz (62.9 kg), SpO2 98 %. lungs clear to A&P no rales wheeze rhonchi diminished breath sounds at bases no rales wheeze or rhonchi appreciable heart regular rhythm no S3-S4 no heaves thrills rubs   Investigations:  No results found for this or any previous visit (from the past 240 hour(s)).   Basic Metabolic Panel:  Recent Labs  08/04/15 0625 08/05/15 0600  NA 138 134*  K 5.1 4.4  CL 94* 96*  CO2 29 28  GLUCOSE 126* 83  BUN 61* 39*  CREATININE 8.78* 6.53*  CALCIUM 10.0 8.9  PHOS  --  4.9*   Liver Function Tests:  Recent Labs  08/05/15 0600  AST 22  ALT 12*  ALKPHOS 71  BILITOT 0.4  PROT 6.5  ALBUMIN 3.1*     CBC:  Recent Labs  08/04/15 0625 08/05/15 0600  WBC 8.9 7.3  NEUTROABS 6.7  --   HGB 11.3* 10.2*  HCT 36.3* 32.3*  MCV 101.7* 100.9*  PLT 274 228    Dg Chest 2 View  08/04/2015  CLINICAL DATA:  Chest pain and burning starting around midnight. EXAM: CHEST  2 VIEW COMPARISON:  03/13/2015 FINDINGS: Small left pleural effusion which has decreased from 03/13/2015. Chronic interstitial coarsening without overt pulmonary edema. No pneumothorax. Chronic cardiomegaly and stable aortic tortuosity. IMPRESSION: 1. Chronic left pleural effusion, small today and decreased from most recent comparison. 2. Chronic pulmonary venous congestion. Electronically Signed   By: Monte Fantasia M.D.   On: 08/04/2015 07:03      Medications:   Impression:  Principal Problem:   Chest pain Active  Problems:   HLD (hyperlipidemia)   Secondary cardiomyopathy (HCC)   End stage renal disease, has been peritoneal, now hemodialysis   Elevated troponin   ESRD (end stage renal disease) on dialysis [facility])   CAD (coronary artery disease)     Plan: Continue current therapy for trending of troponins resume dialysis on Monday   Consultants: Cardiology and nephrology   Procedures dialysis yesterday   Antibiotics:                   Code Status:   Family Communication:   Disposition Plan   Time spent: 30 minutes     Amberli Ruegg M   08/05/2015, 11:32 AM

## 2015-08-05 NOTE — Progress Notes (Signed)
Subjective: Patient is feeling much better. Recently he denies any chest pain or difficulty in breathing. His appetite is good and no nausea or vomiting.   Objective: Vital signs in last 24 hours: Temp:  [97.3 F (36.3 C)-98.1 F (36.7 C)] 97.5 F (36.4 C) (11/12 0643) Pulse Rate:  [60-93] 88 (11/12 0643) Resp:  [16-20] 20 (11/12 0643) BP: (78-178)/(45-86) 162/84 mmHg (11/12 0643) SpO2:  [95 %-99 %] 99 % (11/12 0643) Weight:  [138 lb 10.7 oz (62.9 kg)] 138 lb 10.7 oz (62.9 kg) (11/12 0643)  Intake/Output from previous day: 11/11 0701 - 11/12 0700 In: 3 [I.V.:3] Out: 1961  Intake/Output this shift:     Recent Labs  08/04/15 0625 08/05/15 0600  HGB 11.3* 10.2*    Recent Labs  08/04/15 0625 08/05/15 0600  WBC 8.9 7.3  RBC 3.57* 3.20*  HCT 36.3* 32.3*  PLT 274 228    Recent Labs  08/04/15 0625 08/05/15 0600  NA 138 134*  K 5.1 4.4  CL 94* 96*  CO2 29 28  BUN 61* 39*  CREATININE 8.78* 6.53*  GLUCOSE 126* 83  CALCIUM 10.0 8.9   No results for input(s): LABPT, INR in the last 72 hours.  Generally patient is alert and in no apparent distress. Chest: Decreased breath sounds, no wheezing Heart exam regular rate and rhythm no murmur Extremities no edema  Assessment/Plan: Problem #1 chest pain/difficulty breathing presently patient is feeling much better. Were able to remove about 1900 mL with dialysis. Problem #2 end-stage renal disease: Status post hemodialysis yesterday. His potassium is normal and patient does not have any uremic signs and symptoms. Problem #3 history of anemia: His hemoglobin and hematocrit is within our target goal Problem #4 metabolic bone disease his calcium and phosphorus is range Problem #5 hypertension: His blood pressure is fluctuating otherwise overall controlled. Problem #6 fluid management: Presently patient is asymptomatic. Problem #7: coronary artery Disease Plan: We'll continue his present management History of dialysis will  be on Monday and if patient is going to be discharged to go to his regular dialysis unit.   Florentina Marquart S 08/05/2015, 9:04 AM

## 2015-08-06 LAB — TROPONIN I
TROPONIN I: 0.52 ng/mL — AB (ref <0.031)
Troponin I: 0.5 ng/mL (ref <0.031)

## 2015-08-06 MED ORDER — TRAMADOL HCL 50 MG PO TABS
50.0000 mg | ORAL_TABLET | Freq: Four times a day (QID) | ORAL | Status: DC | PRN
  Administered 2015-08-06 – 2015-08-08 (×4): 50 mg via ORAL
  Filled 2015-08-06 (×4): qty 1

## 2015-08-06 NOTE — Progress Notes (Signed)
Subjective: Patient denies any more chest pain. He is feeling good and no nausea or vomiting. He complains of pain of his left big toe.  Objective: Vital signs in last 24 hours: Temp:  [98 F (36.7 C)-98.5 F (36.9 C)] 98.5 F (36.9 C) (11/13 0519) Pulse Rate:  [77-79] 77 (11/13 0519) Resp:  [18] 18 (11/13 0519) BP: (133-148)/(64-74) 133/74 mmHg (11/13 0519) SpO2:  [97 %-98 %] 98 % (11/13 0519) Weight:  [139 lb 1.6 oz (63.095 kg)] 139 lb 1.6 oz (63.095 kg) (11/13 0519)  Intake/Output from previous day: 11/12 0701 - 11/13 0700 In: 243 [P.O.:240; I.V.:3] Out: -  Intake/Output this shift:     Recent Labs  08/04/15 0625 08/05/15 0600  HGB 11.3* 10.2*    Recent Labs  08/04/15 0625 08/05/15 0600  WBC 8.9 7.3  RBC 3.57* 3.20*  HCT 36.3* 32.3*  PLT 274 228    Recent Labs  08/04/15 0625 08/05/15 0600  NA 138 134*  K 5.1 4.4  CL 94* 96*  CO2 29 28  BUN 61* 39*  CREATININE 8.78* 6.53*  GLUCOSE 126* 83  CALCIUM 10.0 8.9   No results for input(s): LABPT, INR in the last 72 hours.  Generally patient is alert and in no apparent distress. Chest: Decreased breath sounds, no wheezing Heart exam regular rate and rhythm no murmur Extremities no edema. There is no swelling or tenderness of his left big toe.  Assessment/Plan: Problem #1 chest pain/difficulty breathing presently patient is feeling much better. Presently he is asymptomatic. Problem #2 end-stage renal disease: Status post hemodialysis on Friday. Patient presently doesn't have any nausea or vomiting. Problem #3 history of anemia: His hemoglobin and hematocrit is within our target goal. He is on Epogen. Problem #4 metabolic bone disease his calcium and phosphorus is range. Presently he is not on any binder. Problem #5 hypertension: His blood pressure is fluctuating otherwise overall controlled. Problem #6 fluid management: Presently patient is asymptomatic. Problem #7: coronary artery Disease: Chest pain is  better. Problem #8 history of toe pain: Possibly from arthritis as there is no sign of inflammation. Plan: We'll continue his present management We'll make arrangements for patient to get dialysis tomorrow. We'll check his CBC and basic metabolic panel.   Jyren Cerasoli S 08/06/2015, 8:58 AM

## 2015-08-06 NOTE — Progress Notes (Signed)
Patient has no further anginal type ischemic chest pain dates he had burning chest pain 3 days ago none since the dyspnea apnea or PND Randy [name] [id number] DOB: May 15, 1932 DOA: 08/04/2015 PCP: Randy Curet, MD             Physical Exam: Blood pressure 133/74, pulse 77, temperature 98.5 F (36.9 C), temperature source Oral, resp. rate 18, height 5\' 9"  (1.753 m), weight 139 lb 1.6 oz (63.095 kg), SpO2 98 %. lungs show diminished breath sounds in the bases prolonged respiratory phase scattered rhonchi no rales no wheezes appreciable heart regular rhythm no S3 or S4 no heaves thrills rubs   Investigations:  No results found for this or any previous visit (from the past 240 hour(s)).   Basic Metabolic Panel:  Recent Labs  08/04/15 0625 08/05/15 0600  NA 138 134*  K 5.1 4.4  CL 94* 96*  CO2 29 28  GLUCOSE 126* 83  BUN 61* 39*  CREATININE 8.78* 6.53*  CALCIUM 10.0 8.9  PHOS  --  4.9*   Liver Function Tests:  Recent Labs  08/05/15 0600  AST 22  ALT 12*  ALKPHOS 71  BILITOT 0.4  PROT 6.5  ALBUMIN 3.1*     CBC:  Recent Labs  08/04/15 0625 08/05/15 0600  WBC 8.9 7.3  NEUTROABS 6.7  --   HGB 11.3* 10.2*  HCT 36.3* 32.3*  MCV 101.7* 100.9*  PLT 274 228    No results found.    Medicatio  Impression:  Principal Problem:   Chest pain Active Problems:   HLD (hyperlipidemia)   Secondary cardiomyopathy (HCC)   End stage renal disease, has been peritoneal, now hemodialysis   Elevated troponin   ESRD (end stage renal disease) on dialysis [facility])   CAD (coronary artery disease)     Plan: We will obtain troponins 3 to see if there is continuing leak of enzymes ambulate patient observe for symptoms of ischemia. Dialysis in a.m.  Consultants: Cardiology   Procedures   Antibiotics:                   Code Status:  Family Communication:    Disposition Plan see plan above  Time spent: 30 minutes     Randy [name]  M   08/06/2015, 11:40 AM

## 2015-08-06 NOTE — Progress Notes (Signed)
Received call from lab of troponin 0.50.  Dr. Legrand Rams informed.  Patient denies chest pain.  Will continue to monitor.

## 2015-08-07 DIAGNOSIS — I209 Angina pectoris, unspecified: Secondary | ICD-10-CM | POA: Diagnosis not present

## 2015-08-07 DIAGNOSIS — I214 Non-ST elevation (NSTEMI) myocardial infarction: Secondary | ICD-10-CM | POA: Diagnosis not present

## 2015-08-07 DIAGNOSIS — N186 End stage renal disease: Secondary | ICD-10-CM | POA: Diagnosis not present

## 2015-08-07 DIAGNOSIS — E785 Hyperlipidemia, unspecified: Secondary | ICD-10-CM | POA: Diagnosis not present

## 2015-08-07 DIAGNOSIS — I10 Essential (primary) hypertension: Secondary | ICD-10-CM

## 2015-08-07 DIAGNOSIS — I25118 Atherosclerotic heart disease of native coronary artery with other forms of angina pectoris: Secondary | ICD-10-CM

## 2015-08-07 DIAGNOSIS — I350 Nonrheumatic aortic (valve) stenosis: Secondary | ICD-10-CM

## 2015-08-07 LAB — TROPONIN I: TROPONIN I: 0.48 ng/mL — AB (ref <0.031)

## 2015-08-07 LAB — BASIC METABOLIC PANEL
Anion gap: 12 (ref 5–15)
BUN: 88 mg/dL — AB (ref 6–20)
CALCIUM: 8.9 mg/dL (ref 8.9–10.3)
CHLORIDE: 94 mmol/L — AB (ref 101–111)
CO2: 26 mmol/L (ref 22–32)
CREATININE: 11.24 mg/dL — AB (ref 0.61–1.24)
GFR calc non Af Amer: 4 mL/min — ABNORMAL LOW (ref >60)
GFR, EST AFRICAN AMERICAN: 4 mL/min — AB (ref >60)
GLUCOSE: 81 mg/dL (ref 65–99)
Potassium: 5.8 mmol/L — ABNORMAL HIGH (ref 3.5–5.1)
Sodium: 132 mmol/L — ABNORMAL LOW (ref 135–145)

## 2015-08-07 LAB — CBC
HCT: 28.5 % — ABNORMAL LOW (ref 39.0–52.0)
Hemoglobin: 9 g/dL — ABNORMAL LOW (ref 13.0–17.0)
MCH: 31.7 pg (ref 26.0–34.0)
MCHC: 31.6 g/dL (ref 30.0–36.0)
MCV: 100.4 fL — AB (ref 78.0–100.0)
PLATELETS: 194 10*3/uL (ref 150–400)
RBC: 2.84 MIL/uL — AB (ref 4.22–5.81)
RDW: 19.4 % — ABNORMAL HIGH (ref 11.5–15.5)
WBC: 6.1 10*3/uL (ref 4.0–10.5)

## 2015-08-07 MED ORDER — SODIUM CHLORIDE 0.9 % IV SOLN: 100.0000 mL | INTRAVENOUS | Status: DC | PRN

## 2015-08-07 MED ORDER — EPOETIN ALFA 10000 UNIT/ML IJ SOLN
10000.0000 [IU] | INTRAMUSCULAR | Status: DC
  Administered 2015-08-07: 10000 [IU] via INTRAVENOUS

## 2015-08-07 MED ORDER — EPOETIN ALFA 10000 UNIT/ML IJ SOLN
INTRAMUSCULAR | Status: AC
  Administered 2015-08-07: 10000 [IU] via INTRAVENOUS
  Filled 2015-08-07: qty 1

## 2015-08-07 MED ORDER — EPOETIN ALFA 10000 UNIT/ML IJ SOLN
10000.0000 [IU] | INTRAMUSCULAR | Status: DC
  Filled 2015-08-07 (×2): qty 1

## 2015-08-07 NOTE — Consult Note (Signed)
Consulting cardiologist: Randy Sable MD Primary Cardiologist:McDowell, Randy Hawthorne MD  Cardiology Specific Problem List: 1. Multivessel CAD-Managed Medically 2. Systolic Dysfunction-EF [id number]   Subjective:    No chest pain. Wants to go home.   Objective:   Temp:  [97.8 F (36.6 C)-98 F (36.7 C)] 98 F (36.7 C) (11/14 0743) Pulse Rate:  [75-79] 76 (11/14 0743) Resp:  [12-20] 12 (11/14 0743) BP: (116-152)/(61-77) 138/68 mmHg (11/14 0743) SpO2:  [94 %-96 %] 95 % (11/14 0743) Weight:  [144 lb 1.6 oz (65.363 kg)] 144 lb 1.6 oz (65.363 kg) (11/14 0558) Last BM Date: 08/04/15  Filed Weights   08/05/15 0643 08/06/15 0519 08/07/15 0558  Weight: 138 lb 10.7 oz (62.9 kg) 139 lb 1.6 oz (63.095 kg) 144 lb 1.6 oz (65.363 kg)    Intake/Output Summary (Last 24 hours) at 08/07/15 0826 Last data filed at 08/06/15 1844  Gross per 24 hour  Intake    843 ml  Output      0 ml  Net    843 ml    Telemetry: Sinus rhythm with LVH.   Exam:  General: No acute distress.  HEENT: Conjunctiva and lids normal, oropharynx clear.  Lungs: Crackles in the bases  Cardiac: No elevated JVP or bruits. RRR, no gallop or rub.   Abdomen: Normoactive bowel sounds, nontender, nondistended.  Extremities: No pitting edema, distal pulses full. Right AKA. Diminished pulses in the left foot. Dialysis fistula in the left arm.   Neuropsychiatric: Alert and oriented x3, affect appropriate.   Lab Results:  Basic Metabolic Panel:  Recent Labs Lab 08/04/15 0625 08/05/15 0600 08/07/15 0532  NA 138 134* 132*  K 5.1 4.4 5.8*  CL 94* 96* 94*  CO2 29 28 26   GLUCOSE 126* 83 81  BUN 61* 39* 88*  CREATININE 8.78* 6.53* 11.24*  CALCIUM 10.0 8.9 8.9    Liver Function Tests:  Recent Labs Lab 08/05/15 0600  AST 22  ALT 12*  ALKPHOS 71  BILITOT 0.4  PROT 6.5  ALBUMIN 3.1*    CBC:  Recent Labs Lab 08/04/15 0625 08/05/15 0600 08/07/15 0532  WBC 8.9 7.3 6.1  HGB 11.3* 10.2* 9.0*  HCT  36.3* 32.3* 28.5*  MCV 101.7* 100.9* 100.4*  PLT 274 228 194    Cardiac Enzymes:  Recent Labs Lab 08/06/15 1212 08/06/15 1729 08/06/15 2358  TROPONINI 0.50* 0.52* 0.48*    BNP:  Recent Labs  08/17/14 0625 08/29/14 0928  PROBNP >70000.0* >70000.0*   Echocardiogram 08/04/2015 Left ventricle: The cavity size was normal. Systolic function was severely reduced. The estimated ejection fraction was in the range of 25% to 30%. Diffuse hypokinesis. There is severe hypokinesis of the basal-midinferoseptal myocardium. There is akinesis of the basalinferior myocardium. There is severe hypokinesis of the mid-apicalinferior myocardium. There is akinesis of the entireinferolateral myocardium. Features are consistent with a pseudonormal left ventricular filling pattern, with concomitant abnormal relaxation and increased filling pressure (grade 2 diastolic dysfunction). - Aortic valve: Trileaflet; mildly thickened leaflets. There was trivial regurgitation. - Mitral valve: There was mild regurgitation. - Tricuspid valve: There was trivial regurgitation. - Pulmonic valve: There was trivial regurgitation. - Pulmonary arteries: PA peak pressure: 36 mm Hg (S). - Pericardium, extracardiac: There was a left pleural effusion.    Medications:   Scheduled Medications: . amLODipine  10 mg Oral Daily  . aspirin  81 mg Oral Daily  . atorvastatin  40 mg Oral q1800  . calcitRIOL  0.5 mcg Oral Daily  .  carvedilol  25 mg Oral BID WC  . heparin  5,000 Units Subcutaneous 3 times per day  . isosorbide mononitrate  30 mg Oral Daily  . lisinopril  10 mg Oral Daily  . sodium chloride  3 mL Intravenous Q12H    PRN Medications: sodium chloride, sodium chloride, acetaminophen **OR** acetaminophen, alteplase, cloNIDine, heparin, hydrALAZINE, lidocaine (PF), lidocaine-prilocaine, morphine injection, nitroGLYCERIN, oxyCODONE-acetaminophen, pentafluoroprop-tetrafluoroeth, rOPINIRole,  traMADol   Assessment and Plan:   1. Chest Pain: Resolved with dialysis and current medication regimen. Some fluid overload component. Troponin trending downward. No evidence of ACS. Would continue medical management. Ok to go home from cardiology standpoint.   2. CAD: Multivessel, would not arrange any new ischemic work-up at this time unless chest pain recurs. Last cath in 2015 with non-obstructive disease. Continue ASA, statin, carvedilol, ACE and nitrates. Follow up appointment with cardiology on discharge.   3. Hypertension: Well controlled on current medical management and with dialysis.   4. ESRD: Will have dialysis today and then is supposed to be discharged.       Randy [name]. Lawrence NP [place]  08/07/2015, 8:26 AM   The patient was seen and examined, and I agree with the assessment and plan as documented above, with modifications as noted below. Pt admitted with medically managed NSTEMI, troponins peaking at 1.09 on 11/11, down to 0.48 on 11/13. Symptomatically stable, denying chest pain and shortness of breath. Review of echocardiogram shows further reduction in LVEF to 30% and progression to moderate aortic stenosis. He is on good medical therapy. If chest pain recurs, could consider outpatient ischemic evaluation (nuclear stress test) to see if coronary angiography is warranted. No further recommendations at this time.   Randy Sable, MD, [facility]  08/07/2015 10:39 AM

## 2015-08-07 NOTE — Procedures (Signed)
   HEMODIALYSIS TREATMENT NOTE:  2.3 liters removed. Ultrafiltration interrupted x15 minutes due to hypotension. All blood reinfused. Hemostasis achieved in 15 minutes. Report called to Sharyn Blitz, RN.  Rockwell [place], RN,CDN

## 2015-08-07 NOTE — Progress Notes (Signed)
Subjective: Patient continue to feel much better and offers no complaint  Objective: Vital signs in last 24 hours: Temp:  [97.8 F (36.6 C)-98 F (36.7 C)] 98 F (36.7 C) (11/14 0743) Pulse Rate:  [75-79] 76 (11/14 0743) Resp:  [12-20] 12 (11/14 0743) BP: (116-152)/(61-77) 138/68 mmHg (11/14 0743) SpO2:  [94 %-96 %] 95 % (11/14 0743) Weight:  [144 lb 1.6 oz (65.363 kg)] 144 lb 1.6 oz (65.363 kg) (11/14 0558)  Intake/Output from previous day: 11/13 0701 - 11/14 0700 In: 843 [P.O.:840; I.V.:3] Out: -  Intake/Output this shift:     Recent Labs  08/05/15 0600 08/07/15 0532  HGB 10.2* 9.0*    Recent Labs  08/05/15 0600 08/07/15 0532  WBC 7.3 6.1  RBC 3.20* 2.84*  HCT 32.3* 28.5*  PLT 228 194    Recent Labs  08/05/15 0600 08/07/15 0532  NA 134* 132*  K 4.4 5.8*  CL 96* 94*  CO2 28 26  BUN 39* 88*  CREATININE 6.53* 11.24*  GLUCOSE 83 81  CALCIUM 8.9 8.9   No results for input(s): LABPT, INR in the last 72 hours.  Generally patient is alert and in no apparent distress. Chest: Decreased breath sounds, no wheezing Heart exam regular rate and rhythm no murmur Extremities no edema.   Assessment/Plan: Problem #1 chest pain/difficulty breathing has improved Problem #2 end-stage renal disease: Status post hemodialysis on Friday. No uremic sing and symptoms but his potassium is high. 5.8 Problem #3 history of anemia: His hemoglobin has declined below our target goal . Today it is 9 Problem #4 metabolic bone disease his calcium and phosphorus is range. Presently he is not on any binder. Problem #5 hypertension: His blood pressure is fluctuating otherwise overall controlled. Problem #6 fluid management: Presently patient is asymptomatic. Problem #7: coronary artery Disease: No chest chest pain . Problem #8 history of toe pain: Is better today Plan:1] We'll make arrangement for dialysis today 2] We will use 2k/2.5 ca bath 3] Epgen 10000 unit iv with  dialysis   [facility] S 08/07/2015, 8:26 AM

## 2015-08-07 NOTE — Care Management Obs Status (Signed)
[facility] NOTIFICATION   Patient Details  Name: RICH YAEGER MRN: [id number] Date of Birth: 06/08/1932   Medicare Observation Status Notification Given:  Yes    Sherald Barge, RN 08/07/2015, 3:59 PM

## 2015-08-07 NOTE — Progress Notes (Signed)
Patient had serial troponins drawn yesterday having about 0.50 for 3 successive measurements this is presumably due to renal failure possibly ischemic demand is no evidence of clinical angina or anginal complaints no evidence of dyspnea orthopnea PND he's scheduled for dialysis today. We'll be discharged tomorrow cardiology approval Randy [name] DOB: 1932/02/28 DOA: 08/04/2015 PCP: Randy Curet, MD             Physical Exam: Blood pressure 138/68, pulse 76, temperature 98 F (36.7 C), temperature source Oral, resp. rate 12, height 5\' 9"  (1.753 [name]), weight 144 lb 1.6 oz (65.363 kg), SpO2 95 %. lungs clear to A&P no rales wheeze rhonchi heart regular rhythm no S3-S4 no heaves thrills rubs abdomen soft nontender bowel sounds normoactive  Investigations:  No results found for this or any previous visit (from the past 240 hour(s)).   Basic Metabolic Panel:  Recent Labs  08/05/15 0600 08/07/15 0532  NA 134* 132*  K 4.4 5.8*  CL 96* 94*  CO2 28 26  GLUCOSE 83 81  BUN 39* 88*  CREATININE 6.53* 11.24*  CALCIUM 8.9 8.9  PHOS 4.9*  --    Liver Function Tests:  Recent Labs  08/05/15 0600  AST 22  ALT 12*  ALKPHOS 71  BILITOT 0.4  PROT 6.5  ALBUMIN 3.1*     CBC:  Recent Labs  08/05/15 0600 08/07/15 0532  WBC 7.3 6.1  HGB 10.2* 9.0*  HCT 32.3* 28.5*  MCV 100.9* 100.4*  PLT 228 194    No results found.    Medications:   Impression:  Principal Problem:   Chest pain Active Problems:   HLD (hyperlipidemia)   Secondary cardiomyopathy (HCC)   End stage renal disease, has been peritoneal, now hemodialysis   Elevated troponin   ESRD (end stage renal disease) on dialysis ([place])   CAD (coronary artery disease)     Plan: Dialysis today awaiting cardiology input we'll discharge presumably in a.[name]. continue current therapy   Consultants:    Procedures   Antibiotics:                   Code Status:  Family Communication:     Disposition Plan see plan above  Time spent: 30 minutes     Randy [name]   08/07/2015, 12:12 PM

## 2015-08-07 NOTE — Care Management Note (Signed)
Case Management Note  Patient Details  Name: Randy [name] MRN: [id number] Date of Birth: [birth year]  Subjective/Objective:                  Pt is from home, lives with his wife and is ind with ADL's. Pt receives HD on MWF at [facility] in [place]. Pt has friend who provides transportation. Pt has had assistance in the home in the past but no longer requires it.   Action/Plan: Pt plans to return home with self care at DC. No CM needs anticipated.   Expected Discharge Date:  08/06/15               Expected Discharge Plan:  Home/Self Care  In-House Referral:  NA  Discharge planning Services  CM Consult  Post Acute Care Choice:  NA Choice offered to:  NA  DME Arranged:    DME Agency:     HH Arranged:    HH Agency:     Status of Service:  Completed, signed off  Medicare Important Message Given:    Date Medicare IM Given:    Medicare IM give by:    Date Additional Medicare IM Given:    Additional Medicare Important Message give by:     If discussed at [place] of Stay Meetings, dates discussed:    Additional Comments:  Sherald Barge, RN 08/07/2015, 3:59 PM

## 2015-08-08 NOTE — Progress Notes (Addendum)
Patient discharged home.  IV removed - WNL.  No changes to medications made.  Educated on CP and when to seek medical attention.  No questions at this time.  Stable to DC home.  Follow up in place with cardio.

## 2015-08-08 NOTE — Progress Notes (Signed)
Randy [name]  MRN: [id number]  DOB/AGE: 1932/02/19 81 y.o.  Primary Care 76 M, MD  Admit date: 08/04/2015  Chief Complaint:  Chief Complaint  Patient presents with  . Chest Pain    S-Pt presented on  08/04/2015 with  Chief Complaint  Patient presents with  . Chest Pain  .    Pt says "I am waiting to go home, Can you send me home?".      Meds . amLODipine  10 mg Oral Daily  . aspirin  81 mg Oral Daily  . atorvastatin  40 mg Oral q1800  . calcitRIOL  0.5 mcg Oral Daily  . carvedilol  25 mg Oral BID WC  . epoetin (EPOGEN/PROCRIT) injection  10,000 Units Intravenous Q M,W,F-HD  . heparin  5,000 Units Subcutaneous 3 times per day  . isosorbide mononitrate  30 mg Oral Daily  . lisinopril  10 mg Oral Daily  . sodium chloride  3 mL Intravenous Q12H       Physical Exam: Vital signs in last 24 hours: Temp:  [97.7 F (36.5 C)-98.1 F (36.7 C)] 98 F (36.7 C) (11/15 0604) Pulse Rate:  [72-81] 77 (11/15 0604) Resp:  [16-20] 20 (11/15 0604) BP: (82-150)/(47-80) 142/61 mmHg (11/15 0604) SpO2:  [95 %-97 %] 97 % (11/15 0604) Weight:  [146 lb 9.7 oz (66.5 kg)] 146 lb 9.7 oz (66.5 kg) (11/14 1210) Weight change: 2 lb 8.1 oz (1.137 kg) Last BM Date: 08/08/15  Intake/Output from previous day: 11/14 0701 - 11/15 0700 In: 480 [P.O.:480] Out: 2300      Physical Exam: General- pt is awake,alert, oriented to time place and person  Resp- NO REsp distress, Clear to auscultataion CVS- S1S2 regular in rate and rhythm  GIT- BS+, soft, NT, ND. EXT-no LE Edema, No Cyanosis  Access- AVF+ Bruit     Lab Results: CBC  Recent Labs  08/07/15 0532  WBC 6.1  HGB 9.0*  HCT 28.5*  PLT 194    BMET  Recent Labs  08/07/15 0532  NA 132*  K 5.8*  CL 94*  CO2 26  GLUCOSE 81  BUN 88*  CREATININE 11.24*  CALCIUM 8.9    MICRO No results found for this or any previous visit (from the past 240 hour(s)).    Lab Results  Component Value Date   PTH 559.0* 06/24/2012   CALCIUM 8.9 08/07/2015   CAION 1.09* 10/20/2014   PHOS 4.9* 08/05/2015            Impression: 1)Renal ESRD on HD  Pt is  on MWF schedule. Pt last dialyzed yesterday. NO need of HD today .  2)HTN BP  at goal   Medication  On RAAS blockers On Calcium Channel Blockers  On Alpha and beta Blockers  On Vasodilators-     3)Anemia HGb just  at goal (9--11)   on Epo during HD  4)CKD Mineral-Bone Disorder  PTH acceptable .  Secondary Hyperparathyroidism present.  Phosphorus at goal.   5)CAD- admitted with chest pain Cardiology following  6)Electrolytes  Hyperkalemic   Sec to ESRD   Hyponatremic    Sec to ESRD   Had HD yesterday  7)Acid base  Co2 at goal    Plan:  Will continue current care Will dialyze in am if still admitted   [place] 08/08/2015, 9:21 AM

## 2015-08-08 NOTE — Discharge Summary (Signed)
Physician Discharge Summary  Randy [name] [id number] DOB: 1932-07-30 DOA: 08/04/2015  PCP: [name], MD  Admit date: 08/04/2015 Discharge date: 08/08/2015   Recommendations for Outpatient Follow-up:  Artery disease with ischemic cardiomyopathy currently undergoing dialysis with the associated risk factors hemodynamic well-controlled at time of discharge there has been no anginal type pain for the preceding 4 days since admission Discharge Diagnoses:  Principal Problem:   Chest pain Active Problems:   HLD (hyperlipidemia)   Secondary cardiomyopathy (HCC)   End stage renal disease, has been peritoneal, now hemodialysis   Elevated troponin   ESRD (end stage renal disease) on dialysis [facility])   CAD (coronary artery disease)   Discharge Condition: Good  Filed Weights   08/06/15 0519 08/07/15 0558 08/07/15 1210  Weight: 139 lb 1.6 oz (63.095 kg) 144 lb 1.6 oz (65.363 kg) 146 lb 9.7 oz (66.5 kg)    History of present illness:  Patient is an elderly black male with hypertension hyperlipidemia and known coronary arteries ischemic cardiopathy currently undergoing dialysis for end-stage renal disease with concomitant anemia of chronic disease and at that resolved and injections patient was admitted with a pressure-like pain which she also described as a burning-like retrosternal discomfort which persisted for 15 minutes seen in ER and subsequently subsequently subsided he had no further episodes of this type of discomfort while in hospital seen in consultation by cardiology who is mild elevation of troponins on admission these were checked 6 average elevation of troponin was 0.50 cardiology felt this was possibly elevated due to end-stage renal disease 2-D echo repeated revealed multiple regional wall motion abnormalities-fraction 30-35% which is consistent with previous echocardiograms he was deemed stable and continued on medical therapy at home  Hospital Course:  See  history of present illness  Procedures:    Consultations: Cardiology and nephrology  Discharge Instructions  Discharge Instructions    Discharge instructions    Complete by:  As directed      Discharge patient    Complete by:  As directed             Medication List    TAKE these medications        amLODipine 10 MG tablet  Commonly known as:  NORVASC  Take 1 tablet (10 mg total) by mouth daily.     aspirin 81 MG tablet  Take 81 mg by mouth daily.     atorvastatin 40 MG tablet  Commonly known as:  LIPITOR  Take 1 tablet (40 mg total) by mouth daily at 6 PM.     calcitRIOL 0.5 MCG capsule  Commonly known as:  ROCALTROL  Take 1 capsule (0.5 mcg total) by mouth daily.     carvedilol 25 MG tablet  Commonly known as:  COREG  Take 1 tablet (25 mg total) by mouth 2 (two) times daily with a meal.     cloNIDine 0.2 MG tablet  Commonly known as:  CATAPRES  Take 0.2 mg by mouth 2 (two) times daily as needed (blood pressure >130/27).     COLCRYS 0.6 MG tablet  Generic drug:  colchicine  Take 1 tablet by mouth daily as needed (gout flare up).     doxycycline 100 MG tablet  Commonly known as:  VIBRA-TABS  Take 1 tablet (100 mg total) by mouth every 12 (twelve) hours.     feeding supplement Liqd  Take 1 Container by mouth 3 (three) times daily between meals.     gabapentin 100 MG  capsule  Commonly known as:  NEURONTIN  Take 1 capsule (100 mg total) by mouth 3 (three) times daily.     hydrALAZINE 25 MG tablet  Commonly known as:  APRESOLINE  Take 1 tablet (25 mg total) by mouth 3 (three) times daily.     labetalol 100 MG tablet  Commonly known as:  NORMODYNE  Take 1 tablet (100 mg total) by mouth 2 (two) times daily.     lisinopril 10 MG tablet  Commonly known as:  PRINIVIL,ZESTRIL  Take 1 tablet by mouth daily.     multivitamin Tabs tablet  Take 1 tablet by mouth daily.     oxyCODONE-acetaminophen 5-325 MG tablet  Commonly known as:  PERCOCET/ROXICET    Take 1 tablet by mouth every 6 (six) hours as needed for severe pain.     pantoprazole 40 MG tablet  Commonly known as:  PROTONIX  Take 1 tablet (40 mg total) by mouth daily at 12 noon.     predniSONE 20 MG tablet  Commonly known as:  DELTASONE  Take 2 tablets by mouth daily X 2 days; then 1 tablet by mouth daily X 2 days; then 1/2 tablet by mouth daily X 3 days and stop prednisone     rOPINIRole 0.25 MG tablet  Commonly known as:  REQUIP  Take 0.25-0.5 mg by mouth at bedtime as needed (restless legs). 1-2 tablets based on severity of restless legs     sevelamer carbonate 800 MG tablet  Commonly known as:  RENVELA  Take 1,600-2,400 mg by mouth See admin instructions. Take 3 tablets (2400 mg) with meals and 2 tablets (1600 mg) with snacks       No Known Allergies     Follow-up Information    Follow up with Jory Sims, NP On 08/21/2015.   Specialties:  Nurse Practitioner, Radiology, Cardiology   Why:  2pm   Contact information:   [place] [address] [phone]        The results of significant diagnostics from this hospitalization (including imaging, microbiology, ancillary and laboratory) are listed below for reference.    Significant Diagnostic Studies: Dg Chest 2 View  08/04/2015  CLINICAL DATA:  Chest pain and burning starting around midnight. EXAM: CHEST  2 VIEW COMPARISON:  03/13/2015 FINDINGS: Small left pleural effusion which has decreased from 03/13/2015. Chronic interstitial coarsening without overt pulmonary edema. No pneumothorax. Chronic cardiomegaly and stable aortic tortuosity. IMPRESSION: 1. Chronic left pleural effusion, small today and decreased from most recent comparison. 2. Chronic pulmonary venous congestion. Electronically Signed   By: Monte Fantasia M.D.   On: 08/04/2015 07:03    Microbiology: No results found for this or any previous visit (from the past 240 hour(s)).   Labs: Basic Metabolic Panel:  Recent Labs Lab  08/04/15 0625 08/05/15 0600 08/07/15 0532  NA 138 134* 132*  K 5.1 4.4 5.8*  CL 94* 96* 94*  CO2 29 28 26   GLUCOSE 126* 83 81  BUN 61* 39* 88*  CREATININE 8.78* 6.53* 11.24*  CALCIUM 10.0 8.9 8.9  PHOS  --  4.9*  --    Liver Function Tests:  Recent Labs Lab 08/05/15 0600  AST 22  ALT 12*  ALKPHOS 71  BILITOT 0.4  PROT 6.5  ALBUMIN 3.1*   No results for input(s): LIPASE, AMYLASE in the last 168 hours. No results for input(s): AMMONIA in the last 168 hours. CBC:  Recent Labs Lab 08/04/15 0625 08/05/15 0600 08/07/15 0532  WBC 8.9 7.3 6.1  NEUTROABS 6.7  --   --   HGB 11.3* 10.2* 9.0*  HCT 36.3* 32.3* 28.5*  MCV 101.7* 100.9* 100.4*  PLT 274 228 194   Cardiac Enzymes:  Recent Labs Lab 08/04/15 1700 08/04/15 2248 08/06/15 1212 08/06/15 1729 08/06/15 2358  TROPONINI 0.71* 1.09* 0.50* 0.52* 0.48*   BNP: BNP (last 3 results)  Recent Labs  01/08/15 1118 08/04/15 0625  BNP >4500.0* 2452.0*    ProBNP (last 3 results)  Recent Labs  08/17/14 0625 08/29/14 0928  PROBNP >70000.0* >70000.0*    CBG: No results for input(s): GLUCAP in the last 168 hours.     Signed:  Mikai Meints Jerilynn Mages  [facility] Pager: [phone] 08/08/2015, 12:07 PM

## 2015-08-08 NOTE — Care Management Note (Signed)
Case Management Note  Patient Details  Name: DATAVIOUS KYGER MRN: [id number] Date of Birth: 1932/05/13  Subjective/Objective:                  Pt discharging home today with self care. No CM needs.   Action/Plan:   Expected Discharge Date:  08/06/15               Expected Discharge Plan:  Home/Self Care  In-House Referral:  NA  Discharge planning Services  CM Consult  Post Acute Care Choice:  NA Choice offered to:  NA  DME Arranged:    DME Agency:     HH Arranged:    HH Agency:     Status of Service:  Completed, signed off  Medicare Important Message Given:    Date Medicare IM Given:    Medicare IM give by:    Date Additional Medicare IM Given:    Additional Medicare Important Message give by:     If discussed at [place] of Stay Meetings, dates discussed:    Additional Comments:  Sherald Barge, RN 08/08/2015, 3:29 PM

## 2015-08-09 ENCOUNTER — Ambulatory Visit: Payer: Medicare Other | Admitting: Vascular Surgery

## 2015-08-09 ENCOUNTER — Encounter (HOSPITAL_COMMUNITY): Payer: Medicare Other

## 2015-08-21 ENCOUNTER — Encounter: Payer: Medicare Other | Admitting: Adult Health

## 2015-08-21 NOTE — Progress Notes (Signed)
Cardiology Office Note   Date:  08/21/2015   ID:  YOSVANI ALLARD, DOB 30-May-1932, MRN [id number]  PCP:  [name], MD  Cardiologist: [place]/  Jory Sims, NP   No chief complaint on file.  ERROR NO Show

## 2015-08-22 ENCOUNTER — Encounter: Payer: Medicare Other | Admitting: Adult Health

## 2015-08-22 NOTE — Progress Notes (Signed)
Cardiology Office Note   Date:  08/22/2015   ID:  TRUBY SHEU, DOB [birth year], MRN [id number]  PCP:  [name], MD  Cardiologist: [place]/ Jory Sims, NP   No chief complaint on file.   ERROR Cancelled Appointment

## 2015-09-20 ENCOUNTER — Telehealth: Payer: Self-pay

## 2015-09-20 ENCOUNTER — Ambulatory Visit (INDEPENDENT_AMBULATORY_CARE_PROVIDER_SITE_OTHER): Payer: Medicare Other | Admitting: Cardiology

## 2015-09-20 ENCOUNTER — Encounter: Payer: Self-pay | Admitting: Cardiology

## 2015-09-20 VITALS — BP 140/62 | HR 75 | Ht 69.0 in | Wt 120.0 lb

## 2015-09-20 DIAGNOSIS — I5042 Chronic combined systolic (congestive) and diastolic (congestive) heart failure: Secondary | ICD-10-CM | POA: Diagnosis not present

## 2015-09-20 DIAGNOSIS — E785 Hyperlipidemia, unspecified: Secondary | ICD-10-CM

## 2015-09-20 DIAGNOSIS — I10 Essential (primary) hypertension: Secondary | ICD-10-CM | POA: Diagnosis not present

## 2015-09-20 DIAGNOSIS — I2510 Atherosclerotic heart disease of native coronary artery without angina pectoris: Secondary | ICD-10-CM

## 2015-09-20 DIAGNOSIS — I350 Nonrheumatic aortic (valve) stenosis: Secondary | ICD-10-CM

## 2015-09-20 MED ORDER — ISOSORBIDE MONONITRATE ER 30 MG PO TB24: 15.0000 mg | ORAL_TABLET | Freq: Every day | ORAL | Status: DC

## 2015-09-20 NOTE — Telephone Encounter (Signed)
Confirmed with  pt wife, He is only taking Coreg.

## 2015-09-20 NOTE — Telephone Encounter (Signed)
-----   Message from Arnoldo Lenis, MD sent at 09/20/2015 12:56 PM EST ----- Can we contact patient and verify if he is taking both coreg and labetalol at home? I did not clarify this during our appt and he should not be on both. Let me know if so and we will make changes  Zandra Abts MD

## 2015-09-20 NOTE — Patient Instructions (Signed)
Medication Instructions:  START IMDUR 15 MG DAILY AT BEDTIME  Labwork: NONE  Testing/Procedures: NONE  Follow-Up: Your physician recommends that you schedule a follow-up appointment in: [place] [facility] [facility]   Any Other Special Instructions Will Be Listed Below (If Applicable).     If you need a refill on your cardiac medications before your next appointment, please call your pharmacy. Thanks for choosing [facility]!!!

## 2015-09-20 NOTE — Progress Notes (Signed)
Patient ID: Randy [name], male   DOB: Oct 26, 1931, 81 y.o.   MRN: [id number]     Clinical Summary Randy [name] is a 81 y.o.male seen today for follow up of the following medical problems.  1. [place] combined systolic/diastolic HF - echo [id number] LVEF 30-35% with restrictive diasotlic function, prior echo 35-40%    2. ESRD - MWF goes to HD.   3. HTN - when checks in AM typically 170/101 before in meds, later in the day typically 130s/50s later in the day. Reports during high bp's in AM can have chest pain at times.   4. CAD - cath 07/2014 with diffuse mainly moderate disease, most significnat disease 80% ramus that was managed medically - admit 07/2015 with NSTEMI, troponins peaked to 1.09. There was some evidence of fluid overload, symptoms reportedly resolved with dialysis.  - reports occasional chest pain in AM only, no exertional symptoms.   5. Hyperlipidemia - 07/2014 panel was at goal.  - compliant with statin  6. Aortic stenosis - mild to moderate by echo 07/2015. AVA 1.2, mean gradient 16. Probable component of low output contributing to the numbers.    7. PAD - followed by vascular, s/p amputation on the right lower extremity   - chronic left leg pain, from vacsular notes only option is amputation if it comes to that.   8. Chest pain - chest pain occurs every morning. Dull pain midchest, 8/10. Worst with movement. Feels dizzy. Lasts about 10 minutes to 1 hour. No chest pains at any other time of day. Often notes his bp's are elevated at this time.    Past Medical History  Diagnosis Date  . Hypercholesterolemia   . Gout   . Nonischemic cardiomyopathy (HCC)     LVEF 35-40%  . Anemia of chronic disease   . Essential hypertension   . Arthritis   . Peripheral vascular disease ([place])     Status post right below knee amputation for a nonhealing wound of the right foot 12/2014  . History of pneumonia 2014  . History of myopericarditis 2015  . ESRD on hemodialysis [facility])       M/W/F in [place] - Dr. Florene Glen  . CAD (coronary artery disease)     Moderate multivessel disease 07/2015 - managed medically     No Known Allergies   Current Outpatient Prescriptions  Medication Sig Dispense Refill  . amLODipine (NORVASC) 10 MG tablet Take 1 tablet (10 mg total) by mouth daily. (Patient not taking: Reported on 08/04/2015) 30 tablet 3  . aspirin 81 MG tablet Take 81 mg by mouth daily.    Marland Kitchen atorvastatin (LIPITOR) 40 MG tablet Take 1 tablet (40 mg total) by mouth daily at 6 PM. (Patient not taking: Reported on 08/04/2015) 30 tablet 1  . calcitRIOL (ROCALTROL) 0.5 MCG capsule Take 1 capsule (0.5 mcg total) by mouth daily. 30 capsule 3  . carvedilol (COREG) 25 MG tablet Take 1 tablet (25 mg total) by mouth 2 (two) times daily with a meal. (Patient taking differently: Take 25 mg by mouth daily. ) 60 tablet 1  . cloNIDine (CATAPRES) 0.2 MG tablet Take 0.2 mg by mouth 2 (two) times daily as needed (blood pressure >130/27).   10  . COLCRYS 0.6 MG tablet Take 1 tablet by mouth daily as needed (gout flare up).   2  . doxycycline (VIBRA-TABS) 100 MG tablet Take 1 tablet (100 mg total) by mouth every 12 (twelve) hours. 14 tablet 0  . feeding supplement,  RESOURCE BREEZE, (RESOURCE BREEZE) LIQD Take 1 Container by mouth 3 (three) times daily between meals. 90 Container 5  . gabapentin (NEURONTIN) 100 MG capsule Take 1 capsule (100 mg total) by mouth 3 (three) times daily. (Patient taking differently: Take 100 mg by mouth at bedtime. ) 60 capsule 0  . hydrALAZINE (APRESOLINE) 25 MG tablet Take 1 tablet (25 mg total) by mouth 3 (three) times daily. (Patient taking differently: Take 25 mg by mouth 3 (three) times daily as needed (High blood pressure). ) 90 tablet 3  . labetalol (NORMODYNE) 100 MG tablet Take 1 tablet (100 mg total) by mouth 2 (two) times daily. (Patient not taking: Reported on 08/04/2015) 60 tablet 1  . lisinopril (PRINIVIL,ZESTRIL) 10 MG tablet Take 1 tablet by mouth  daily.  2  . multivitamin (RENA-VIT) TABS tablet Take 1 tablet by mouth daily.    Marland Kitchen oxyCODONE-acetaminophen (PERCOCET/ROXICET) 5-325 MG per tablet Take 1 tablet by mouth every 6 (six) hours as needed for severe pain. (Patient not taking: Reported on 08/04/2015) 20 tablet 0  . pantoprazole (PROTONIX) 40 MG tablet Take 1 tablet (40 mg total) by mouth daily at 12 noon. (Patient taking differently: Take 40 mg by mouth daily as needed (acid reflux). ) 30 tablet 11  . predniSONE (DELTASONE) 20 MG tablet Take 2 tablets by mouth daily X 2 days; then 1 tablet by mouth daily X 2 days; then 1/2 tablet by mouth daily X 3 days and stop prednisone (Patient not taking: Reported on 08/04/2015) 9 tablet 0  . rOPINIRole (REQUIP) 0.25 MG tablet Take 0.25-0.5 mg by mouth at bedtime as needed (restless legs). 1-2 tablets based on severity of restless legs    . sevelamer carbonate (RENVELA) 800 MG tablet Take 1,600-2,400 mg by mouth See admin instructions. Take 3 tablets (2400 mg) with meals and 2 tablets (1600 mg) with snacks     No current facility-administered medications for this visit.     Past Surgical History  Procedure Laterality Date  . Knee arthroscopy Right 2007  . Back surgery    . Hemorrhoid surgery  1970's  . Ganglion cyst excision  01/03/2012    Procedure: REMOVAL GANGLION OF WRIST;  Surgeon: Scherry Ran, MD;  Location: [facility];  Service: General;  Laterality: Right;  . Colonoscopy    . Av fistula placement  08/24/2012    Procedure: ARTERIOVENOUS (AV) FISTULA CREATION;  Surgeon: Rosetta Posner, MD;  Location: [place];  Service: Vascular;  Laterality: Left;  . Insertion of dialysis catheter Right      neck  . Insertion of dialysis catheter  10/19/2012    Procedure: INSERTION OF DIALYSIS CATHETER;  Surgeon: Rosetta Posner, MD;  Location: [place];  Service: Vascular;  Laterality: N/A;  REMOVE TEMPORARY CATH  . Cholecystectomy    . Lumbar disc surgery  2004  . Left heart catheterization with coronary  angiogram N/A 08/17/2014    Procedure: LEFT HEART CATHETERIZATION WITH CORONARY ANGIOGRAM;  Surgeon: Larey Dresser, MD;  Location[facility] CATH LAB;  Service: Cardiovascular;  Laterality: N/A;  . Colonoscopy Left 09/26/2014    Procedure: COLONOSCOPY;  Surgeon: Arta Silence, MD;  Location: [facility] ENDOSCOPY;  Service: Endoscopy;  Laterality: Left;  . Amputation Right 01/05/2015    Procedure: AMPUTATION BELOW KNEE;  Surgeon: Angelia Mould, MD;  Location: [place];  Service: Vascular;  Laterality: Right;  . Capd removal N/A 03/13/2015    Procedure: CONTINUOUS AMBULATORY PERITONEAL DIALYSIS  (CAPD) CATHETER REMOVAL;  Surgeon:  Coralie Keens, MD;  Location: [place];  Service: General;  Laterality: N/A;     No Known Allergies    Family History  Problem Relation Age of Onset  . Arthritis    . Cancer    . Kidney disease    . Anesthesia problems Neg Hx   . Hypotension Neg Hx   . Malignant hyperthermia Neg Hx   . Pseudochol deficiency Neg Hx   . Cancer Sister   . Colon cancer Brother   . Colon cancer Brother      Social History Mr. Doolin reports that he quit smoking about 17 years ago. His smoking use included Cigarettes. He has a 30 pack-year smoking history. He quit smokeless tobacco use about 21 years ago. His smokeless tobacco use included Chew. Mr. Granado reports that he does not drink alcohol.   Review of Systems CONSTITUTIONAL: No weight loss, fever, chills, weakness or fatigue.  HEENT: Eyes: No visual loss, blurred vision, double vision or yellow sclerae.No hearing loss, sneezing, congestion, runny nose or sore throat.  SKIN: No rash or itching.  CARDIOVASCULAR: per HPI RESPIRATORY: No shortness of breath, cough or sputum.  GASTROINTESTINAL: No anorexia, nausea, vomiting or diarrhea. No abdominal pain or blood.  GENITOURINARY: No burning on urination, no polyuria NEUROLOGICAL: No headache, dizziness, syncope, paralysis, ataxia, numbness or tingling in the extremities. No change in  bowel or bladder control.  MUSCULOSKELETAL: left leg pain  LYMPHATICS: No enlarged nodes. No history of splenectomy.  PSYCHIATRIC: No history of depression or anxiety.  ENDOCRINOLOGIC: No reports of sweating, cold or heat intolerance. No polyuria or polydipsia.  Marland Kitchen   Physical Examination Filed Vitals:   09/20/15 0846  BP: 140/62  Pulse: 75   Filed Vitals:   09/20/15 0846  Height: 5\' 9"  (1.753 m)  Weight: 120 lb (54.432 kg)    Gen: resting comfortably, no acute distress HEENT: no scleral icterus, pupils equal round and reactive, no palptable cervical adenopathy,  CV: RRR, 3/6 systolic murmur RUSB, no jvd Resp: Clear to auscultation bilaterally GI: abdomen is soft, non-tender, non-distended, normal bowel sounds, no hepatosplenomegaly MSK: left lower extremity is warm, no edema. Right lower extremity s/p amputation Skin: warm, no rash Neuro:  no focal deficits Psych: appropriate affect   Diagnostic Studies 07/2015 echo Study Conclusions  - Left ventricle: The cavity size was normal. Wall thickness was increased increased in a pattern of mild to moderate LVH. Incidentally noted transverse false tendon in LV. Systolic function was moderately to severely reduced. The estimated ejection fraction was in the range of 30% to 35%. Diffuse hypokinesis. There is severe hypokinesis of the basal-midinferolateral and inferior myocardium. Doppler parameters are consistent with restrictive physiology, indicative of decreased left ventricular diastolic compliance and/or increased left atrial pressure. - Aortic valve: Moderately calcified annulus. Trileaflet; mildly calcified leaflets. Cusp separation was reduced. There was moderate stenosis. There was trivial regurgitation. Mean gradient (S): 16 mm Hg. VTI ratio of LVOT to aortic valve: 0.4. Valve area (VTI): 1.24 cm^2. Valve area (Vmax): 1.21 cm^2. - Mitral valve: Calcified annulus. There was trivial  regurgitation. - Left atrium: The atrium was moderately dilated. - Right atrium: The atrium was moderately to severely dilated. Central venous pressure (est): 3 mm Hg. - Atrial septum: No defect or patent foramen ovale was identified. - Tricuspid valve: There was mild regurgitation. - Pulmonary arteries: PA peak pressure: 38 mm Hg (S). - Pericardium, extracardiac: There was no pericardial effusion.  Impressions:  - Mild to moderate LVH with LVEF  approximately 30%, diffuse hypokinesis, most severe in the mid to basal inferior and inferolateral wall. Diastolic filling pattern consistent with restrictive physiology. Moderate left atrial enlargement. Moderate calcific aortic stenosis with trivial aortic regurgitation. Mild tricuspid regurgitation with PASP 38 mmHg. Moderate to severe right atrial enlargement. Compared to the previous study from December 2015, there has been further reduction in LVEF, and progression in degree of aortic stenosis.   07/2014 cath Procedural Findings: Hemodynamics:  AO 129/76  Coronary angiography: Coronary dominance: right  Left mainstem: Short, no significant disease.   Left anterior descending (LAD): There was a moderate D1 that branched early with 40% ostial stenosis. This was followed by a large septal perforator. There was 40-50% proximal LAD stenosis at the takeoff of the septal perforator. 30% mid LAD stenosis at D2.   Left circumflex (LCx): There was a large ramus that branched early into a superior and and inferior division, both vessels were moderate in size. Both divisions of the ramus had relatively up to 80% proximal stenosis. The AV LCx itself had luminal irregularities.   Right coronary artery (RCA): There was an early large acute marginal with 40% proximal and 40% mid vessel stenosis. The RCA had diffuse luminal irregularities and up to 40% mid vessel stenosis.   Left ventriculography: Not done,  echo was done earlier today.   Final Conclusions: Most significant disease appeared to be 80% proximal stenoses in the moderate superior and inferior divisions of the ramus. These lesions do not look like culprits for ACS (plaque rupture). I think that the main process here is likely myopericarditis in the setting of renal disease and ?inadequate dialysis (does PD at home). I will continue high dose aspirin 650 q8 hrs and will give 1 dose of colchicine. Stop heparin drip with small percardial effusion and cycle troponin to peak.     Assessment and Plan  1. Chronic combined systolic/diastolic HF - appears euvolemic - no current symptoms, though fairly sedentary lifestyle - will continue current meds, we will clarify if he is taking both coreg and labetalol at home as she should not be on both - work to optimize medical therapy and then repeat echo, will need to consider discussion about ICD at that time. Given his advanced comorbidities and age does not seem like an optimal candidate but its something we will discuss.   2. ESRD - continue HD per renal. Does have some occasoinal low bp's dyring HD  3. CAD - notes some chest pains, interstingly they are only first thing in the morning and resolve after taking his medications - will start imdur 15mg  daily at night, thi will also be beneficial for his sysotlic HF combined with his hydralazine  4. Hyperlipidemia - lipids at goal, continue current statin. Consider 80mg  of atorva next visit given his CAD and PAD history if he can tolerate  5. Aortic stenosis - mild to moderate by echo 07/2015, continue to monitor.  6. PAD - per vascular  7. HTN - elevated in AM only, otherwise controlled. Follow with long acting nitrate at night as described above.      Arnoldo Lenis, M.D.

## 2015-09-20 NOTE — Telephone Encounter (Signed)
Thanks, please update his med list   Zandra Abts MD

## 2015-10-02 ENCOUNTER — Encounter (HOSPITAL_COMMUNITY): Payer: Self-pay | Admitting: Family Medicine

## 2015-10-02 ENCOUNTER — Emergency Department (HOSPITAL_COMMUNITY)
Admission: EM | Admit: 2015-10-02 | Discharge: 2015-10-02 | Disposition: A | Discharge status: Home / Self Care | Payer: Medicare Other | Attending: Emergency Medicine | Admitting: Emergency Medicine

## 2015-10-02 ENCOUNTER — Emergency Department (HOSPITAL_COMMUNITY): Payer: Medicare Other

## 2015-10-02 DIAGNOSIS — Z9889 Other specified postprocedural states: Secondary | ICD-10-CM | POA: Insufficient documentation

## 2015-10-02 DIAGNOSIS — M79672 Pain in left foot: Secondary | ICD-10-CM | POA: Diagnosis not present

## 2015-10-02 DIAGNOSIS — Z8701 Personal history of pneumonia (recurrent): Secondary | ICD-10-CM | POA: Insufficient documentation

## 2015-10-02 DIAGNOSIS — I120 Hypertensive chronic kidney disease with stage 5 chronic kidney disease or end stage renal disease: Secondary | ICD-10-CM | POA: Insufficient documentation

## 2015-10-02 DIAGNOSIS — Z992 Dependence on renal dialysis: Secondary | ICD-10-CM | POA: Insufficient documentation

## 2015-10-02 DIAGNOSIS — Z79899 Other long term (current) drug therapy: Secondary | ICD-10-CM | POA: Diagnosis not present

## 2015-10-02 DIAGNOSIS — R200 Anesthesia of skin: Secondary | ICD-10-CM | POA: Diagnosis not present

## 2015-10-02 DIAGNOSIS — N186 End stage renal disease: Secondary | ICD-10-CM | POA: Insufficient documentation

## 2015-10-02 DIAGNOSIS — M1990 Unspecified osteoarthritis, unspecified site: Secondary | ICD-10-CM | POA: Insufficient documentation

## 2015-10-02 DIAGNOSIS — G8929 Other chronic pain: Secondary | ICD-10-CM | POA: Insufficient documentation

## 2015-10-02 DIAGNOSIS — E7800 Pure hypercholesterolemia, unspecified: Secondary | ICD-10-CM | POA: Diagnosis not present

## 2015-10-02 DIAGNOSIS — Z87891 Personal history of nicotine dependence: Secondary | ICD-10-CM | POA: Insufficient documentation

## 2015-10-02 DIAGNOSIS — I739 Peripheral vascular disease, unspecified: Secondary | ICD-10-CM | POA: Insufficient documentation

## 2015-10-02 DIAGNOSIS — Z7982 Long term (current) use of aspirin: Secondary | ICD-10-CM | POA: Diagnosis not present

## 2015-10-02 DIAGNOSIS — I2510 Atherosclerotic heart disease of native coronary artery without angina pectoris: Secondary | ICD-10-CM | POA: Insufficient documentation

## 2015-10-02 DIAGNOSIS — Z862 Personal history of diseases of the blood and blood-forming organs and certain disorders involving the immune mechanism: Secondary | ICD-10-CM | POA: Insufficient documentation

## 2015-10-02 DIAGNOSIS — Z792 Long term (current) use of antibiotics: Secondary | ICD-10-CM | POA: Insufficient documentation

## 2015-10-02 DIAGNOSIS — Z7952 Long term (current) use of systemic steroids: Secondary | ICD-10-CM | POA: Diagnosis not present

## 2015-10-02 LAB — CBC WITH DIFFERENTIAL/PLATELET
BASOS PCT: 0 %
Basophils Absolute: 0 10*3/uL (ref 0.0–0.1)
Eosinophils Absolute: 0.1 10*3/uL (ref 0.0–0.7)
Eosinophils Relative: 1 %
HEMATOCRIT: 37.5 % — AB (ref 39.0–52.0)
Hemoglobin: 11 g/dL — ABNORMAL LOW (ref 13.0–17.0)
Lymphocytes Relative: 12 %
Lymphs Abs: 0.8 10*3/uL (ref 0.7–4.0)
MCH: 29.7 pg (ref 26.0–34.0)
MCHC: 29.3 g/dL — ABNORMAL LOW (ref 30.0–36.0)
MCV: 101.4 fL — ABNORMAL HIGH (ref 78.0–100.0)
MONO ABS: 1 10*3/uL (ref 0.1–1.0)
MONOS PCT: 15 %
NEUTROS ABS: 4.7 10*3/uL (ref 1.7–7.7)
Neutrophils Relative %: 72 %
Platelets: 201 10*3/uL (ref 150–400)
RBC: 3.7 MIL/uL — ABNORMAL LOW (ref 4.22–5.81)
RDW: 18.3 % — ABNORMAL HIGH (ref 11.5–15.5)
WBC: 6.5 10*3/uL (ref 4.0–10.5)

## 2015-10-02 LAB — BASIC METABOLIC PANEL
ANION GAP: 13 (ref 5–15)
BUN: 25 mg/dL — AB (ref 6–20)
CALCIUM: 8.9 mg/dL (ref 8.9–10.3)
CO2: 29 mmol/L (ref 22–32)
Chloride: 94 mmol/L — ABNORMAL LOW (ref 101–111)
Creatinine, Ser: 6.12 mg/dL — ABNORMAL HIGH (ref 0.61–1.24)
GFR calc Af Amer: 9 mL/min — ABNORMAL LOW (ref >60)
GFR calc non Af Amer: 8 mL/min — ABNORMAL LOW (ref >60)
GLUCOSE: 95 mg/dL (ref 65–99)
Potassium: 4.4 mmol/L (ref 3.5–5.1)
Sodium: 136 mmol/L (ref 135–145)

## 2015-10-02 MED ORDER — HYDROMORPHONE HCL 1 MG/ML IJ SOLN: 1.0000 mg | Freq: Once | INTRAMUSCULAR | Status: DC

## 2015-10-02 NOTE — ED Notes (Signed)
Pt here with circulation issues in left foot and pain. sts hx of same and amputation of right foot.

## 2015-10-02 NOTE — ED Notes (Signed)
Patient refused gown. Requesting to be directly admitted.

## 2015-10-02 NOTE — ED Provider Notes (Signed)
CSN: [id number]     Arrival date & time 10/02/15  1756 History   First MD Initiated Contact with Patient 10/02/15 2058     Chief Complaint  Patient presents with  . Foot Pain     (Consider location/radiation/quality/duration/timing/severity/associated sxs/prior Treatment) HPI  82 year old male with progressively worsening left foot pain. Has had chronic left foot pain from peripheral vascular disease. Over the last month has become progressively worse to the point that he cannot take the pain. No acute worsening today. Called vascular surgery, Dr. Doren Custard, and talked to his nurse who told him he needs to come to the ER for better pain control. He is under the impression he is about to be admitted. Denies any fevers but states he has had worsening pain. No worsening swelling, this is chronic. Chronic numbness in his extremity. Has had a BKA of the right foot for similar symptoms. He is on oxycodone 10 mg every 6 hours with no relief.  Past Medical History  Diagnosis Date  . Hypercholesterolemia   . Gout   . Nonischemic cardiomyopathy (HCC)     LVEF 35-40%  . Anemia of chronic disease   . Essential hypertension   . Arthritis   . Peripheral vascular disease ([place])     Status post right below knee amputation for a nonhealing wound of the right foot 12/2014  . History of pneumonia 2014  . History of myopericarditis 2015  . ESRD on hemodialysis [facility])     M/W/F in [place] - Dr. Florene Glen  . CAD (coronary artery disease)     Moderate multivessel disease 07/2015 - managed medically   Past Surgical History  Procedure Laterality Date  . Knee arthroscopy Right 2007  . Back surgery    . Hemorrhoid surgery  1970's  . Ganglion cyst excision  01/03/2012    Procedure: REMOVAL GANGLION OF WRIST;  Surgeon: Scherry Ran, MD;  Location: [facility];  Service: General;  Laterality: Right;  . Colonoscopy    . Av fistula placement  08/24/2012    Procedure: ARTERIOVENOUS (AV) FISTULA CREATION;  Surgeon:  Rosetta Posner, MD;  Location: [place];  Service: Vascular;  Laterality: Left;  . Insertion of dialysis catheter Right      neck  . Insertion of dialysis catheter  10/19/2012    Procedure: INSERTION OF DIALYSIS CATHETER;  Surgeon: Rosetta Posner, MD;  Location: [place];  Service: Vascular;  Laterality: N/A;  REMOVE TEMPORARY CATH  . Cholecystectomy    . Lumbar disc surgery  2004  . Left heart catheterization with coronary angiogram N/A 08/17/2014    Procedure: LEFT HEART CATHETERIZATION WITH CORONARY ANGIOGRAM;  Surgeon: Larey Dresser, MD;  Location: [facility] CATH LAB;  Service: Cardiovascular;  Laterality: N/A;  . Colonoscopy Left 09/26/2014    Procedure: COLONOSCOPY;  Surgeon: Arta Silence, MD;  Location: [facility] ENDOSCOPY;  Service: Endoscopy;  Laterality: Left;  . Amputation Right 01/05/2015    Procedure: AMPUTATION BELOW KNEE;  Surgeon: Angelia Mould, MD;  Location: [place];  Service: Vascular;  Laterality: Right;  . Capd removal N/A 03/13/2015    Procedure: CONTINUOUS AMBULATORY PERITONEAL DIALYSIS  (CAPD) CATHETER REMOVAL;  Surgeon: Coralie Keens, MD;  Location: [place];  Service: General;  Laterality: N/A;   Family History  Problem Relation Age of Onset  . Arthritis    . Cancer    . Kidney disease    . Anesthesia problems Neg Hx   . Hypotension Neg Hx   . Malignant hyperthermia  Neg Hx   . Pseudochol deficiency Neg Hx   . Cancer Sister   . Colon cancer Brother   . Colon cancer Brother    Social History  Substance Use Topics  . Smoking status: Former Smoker -- 1.00 packs/day for 30 years    Types: Cigarettes    Quit date: 08/19/1998  . Smokeless tobacco: Former Systems developer    Types: Chew    Quit date: 12/31/1993  . Alcohol Use: No    Review of Systems  Constitutional: Negative for fever.  Musculoskeletal: Positive for arthralgias.  Skin: Positive for wound. Negative for color change.  Neurological: Positive for numbness (chronic). Negative for weakness.  All other systems reviewed and  are negative.     Allergies  Review of patient's allergies indicates no known allergies.  Home Medications   Prior to Admission medications   Medication Sig Start Date End Date Taking? Authorizing Provider  aspirin 81 MG tablet Take 81 mg by mouth daily.   Yes Historical Provider, MD  atorvastatin (LIPITOR) 40 MG tablet Take 1 tablet (40 mg total) by mouth daily at 6 PM. 01/13/15  Yes Barton Dubois, MD  calcitRIOL (ROCALTROL) 0.5 MCG capsule Take 1 capsule (0.5 mcg total) by mouth daily. 06/23/14  Yes Lucia Gaskins, MD  carvedilol (COREG) 25 MG tablet Take 1 tablet (25 mg total) by mouth 2 (two) times daily with a meal. 01/13/15  Yes Barton Dubois, MD  cloNIDine (CATAPRES) 0.2 MG tablet Take 0.2 mg by mouth 2 (two) times daily as needed (blood pressure >130/27).  12/27/14  Yes Historical Provider, MD  hydrALAZINE (APRESOLINE) 25 MG tablet Take 1 tablet (25 mg total) by mouth 3 (three) times daily. Patient taking differently: Take 25 mg by mouth 3 (three) times daily as needed (High blood pressure).  08/23/14  Yes Rhonda G Barrett, PA-C  isosorbide mononitrate (IMDUR) 30 MG 24 hr tablet Take 0.5 tablets (15 mg total) by mouth at bedtime. 09/20/15  Yes Arnoldo Lenis, MD  lisinopril (PRINIVIL,ZESTRIL) 10 MG tablet Take 1 tablet by mouth daily. 01/24/15  Yes Historical Provider, MD  LUMIGAN 0.01 % SOLN Place 1 drop into both eyes at bedtime. 09/18/15  Yes Historical Provider, MD  multivitamin (RENA-VIT) TABS tablet Take 1 tablet by mouth daily.   Yes Historical Provider, MD  Oxycodone HCl 10 MG TABS Take 10 mg by mouth every 6 (six) hours as needed (for pain).  09/19/15  Yes Historical Provider, MD  oxyCODONE-acetaminophen (PERCOCET/ROXICET) 5-325 MG per tablet Take 1 tablet by mouth every 6 (six) hours as needed for severe pain. 03/14/15  Yes Coralie Keens, MD  sevelamer carbonate (RENVELA) 800 MG tablet Take 1,600-2,400 mg by mouth See admin instructions. Take 3 tablets (2400 mg) with meals  and 2 tablets (1600 mg) with snacks   Yes Historical Provider, MD  SIMBRINZA 1-0.2 % SUSP Place 1 drop into both eyes 2 (two) times daily. 09/18/15  Yes Historical Provider, MD  amLODipine (NORVASC) 10 MG tablet Take 1 tablet (10 mg total) by mouth daily. 06/23/14   Lucia Gaskins, MD  doxycycline (VIBRA-TABS) 100 MG tablet Take 1 tablet (100 mg total) by mouth every 12 (twelve) hours. 01/13/15   Barton Dubois, MD  feeding supplement, RESOURCE BREEZE, (RESOURCE BREEZE) LIQD Take 1 Container by mouth 3 (three) times daily between meals. 09/30/14   Annita Brod, MD  gabapentin (NEURONTIN) 100 MG capsule Take 1 capsule (100 mg total) by mouth 3 (three) times daily. Patient taking differently: Take 100  mg by mouth at bedtime.  01/22/15   Leonard Schwartz, MD  predniSONE (DELTASONE) 20 MG tablet Take 2 tablets by mouth daily X 2 days; then 1 tablet by mouth daily X 2 days; then 1/2 tablet by mouth daily X 3 days and stop prednisone 01/13/15   Barton Dubois, MD   BP 174/71 mmHg  Pulse 85  Temp(Src) 98.1 F (36.7 C) (Oral)  Resp 18  SpO2 94% Physical Exam  Constitutional: He is oriented to person, place, and time. He appears well-developed and well-nourished.  HENT:  Head: Normocephalic and atraumatic.  Right Ear: External ear normal.  Left Ear: External ear normal.  Nose: Nose normal.  Eyes: Right eye exhibits no discharge. Left eye exhibits no discharge.  Neck: Neck supple.  Cardiovascular: Normal rate, regular rhythm, normal heart sounds and intact distal pulses.   Pulses:      Dorsalis pedis pulses are 0 on the left side.  Monophasic DP pulse in Left foot on doppler  Pulmonary/Chest: Effort normal and breath sounds normal.  Abdominal: Soft. There is no tenderness.  Musculoskeletal: He exhibits no edema.       Left foot: There is tenderness and swelling.       Feet:  Diffuse tenderness with mild swelling to left foot up to ankle. Normal color. No redness or warmth  Neurological: He is  alert and oriented to person, place, and time.  Skin: Skin is warm and dry.  Nursing note and vitals reviewed.   ED Course  Procedures (including critical care time) Labs Review Labs Reviewed  BASIC METABOLIC PANEL - Abnormal; Notable for the following:    Chloride 94 (*)    BUN 25 (*)    Creatinine, Ser 6.12 (*)    GFR calc non Af Amer 8 (*)    GFR calc Af Amer 9 (*)    All other components within normal limits  CBC WITH DIFFERENTIAL/PLATELET - Abnormal; Notable for the following:    RBC 3.70 (*)    Hemoglobin 11.0 (*)    HCT 37.5 (*)    MCV 101.4 (*)    MCHC 29.3 (*)    RDW 18.3 (*)    All other components within normal limits    Imaging Review Dg Foot Complete Left  10/02/2015  CLINICAL DATA:  Left foot pain.  Swelling and wound. EXAM: LEFT FOOT - COMPLETE 3+ VIEW COMPARISON:  Radiographs 12/17/2005 FINDINGS: Site of clinical wound not well seen radiographically. No tracking soft tissue air or radiopaque foreign body. There are hammertoe deformity of the digits. No bony destructive change. No fracture or dislocation. Plantar calcaneal spur. Vascular calcifications are seen. Dorsal soft tissue edema. IMPRESSION: No radiographic evidence of osteomyelitis. Dorsal soft tissue edema. Electronically Signed   By: Jeb Levering M.D.   On: 10/02/2015 22:26   I have personally reviewed and evaluated these images and lab results as part of my medical decision-making.   EKG Interpretation None      MDM   Final diagnoses:  Left foot pain  PVD (peripheral vascular disease) ([place])    Patient with acute on chronic left foot pain. Ultrasound shows similar findings for very poor vascular flow compared to most recent vascular note. I discussed with the vascular surgeon on call, Dr. Bridgett Larsson, who at this point finds no emergent findings and recommends discharge with follow-up in their office in 2 days. There was a delay in getting the patient in IV, when an IV was about to be obtained the  patient refused and states he will just take pain medicine at home and no IV. He requests to be discharged.    Sherwood Gambler, MD 10/02/15 2322

## 2015-10-03 ENCOUNTER — Encounter: Payer: Self-pay | Admitting: Vascular Surgery

## 2015-10-04 ENCOUNTER — Encounter: Payer: Self-pay | Admitting: Vascular Surgery

## 2015-10-04 ENCOUNTER — Other Ambulatory Visit: Payer: Self-pay

## 2015-10-04 ENCOUNTER — Ambulatory Visit (INDEPENDENT_AMBULATORY_CARE_PROVIDER_SITE_OTHER): Payer: Medicare Other | Admitting: Vascular Surgery

## 2015-10-04 VITALS — BP 148/72 | HR 77 | Temp 97.5°F | Ht 69.0 in | Wt 120.0 lb

## 2015-10-04 DIAGNOSIS — I70262 Atherosclerosis of native arteries of extremities with gangrene, left leg: Secondary | ICD-10-CM

## 2015-10-04 NOTE — Progress Notes (Signed)
[facility] [place]  Patient name: Randy [name] MRN: [id number] DOB: December 01, 1931 Sex: male  REASON FOR VISIT: Left foot pain  HPI: Randy [name] is a 82 y.o. male, who I last saw on 03/24/2015 with left foot pain. He had undergone a previous right below-the-knee amputation on 01/05/2015. Of note, he has undergone a previous CT angiogram which was done on 12/31/2014. This showed diffuse atherosclerotic disease involving the aorta, iliac arteries, and also diffuse infrainguinal arterial occlusive disease. His only runoff bilaterally was the peroneal arteries. At the time of his last visit I did not think he was a candidate for revascularization. He is 82 years old, has end-stage renal disease on dialysis, coronary artery disease, congestive heart failure, and is on home O2. I explained that I would recommend a primary amputation if the pain were not tolerable.  He has been having persistent pain in his left foot which is no longer bearable and he would like to proceed with amputation.  Past Medical History  Diagnosis Date  . Hypercholesterolemia   . Gout   . Nonischemic cardiomyopathy (HCC)     LVEF 35-40%  . Anemia of chronic disease   . Essential hypertension   . Arthritis   . Peripheral vascular disease ([place])     Status post right below knee amputation for a nonhealing wound of the right foot 12/2014  . History of pneumonia 2014  . History of myopericarditis 2015  . ESRD on hemodialysis [facility])     M/W/F in [place] - Dr. Florene Glen  . CAD (coronary artery disease)     Moderate multivessel disease 07/2015 - managed medically    Family History  Problem Relation Age of Onset  . Arthritis    . Cancer    . Kidney disease    . Anesthesia problems Neg Hx   . Hypotension Neg Hx   . Malignant hyperthermia Neg Hx   . Pseudochol deficiency Neg Hx   . Cancer Sister   . Colon cancer Brother   . Colon cancer Brother     SOCIAL HISTORY: Social History   Social  History  . Marital Status: Married    Spouse Name: N/A  . Number of Children: N/A  . Years of Education: 10   Occupational History  .     Social History Main Topics  . Smoking status: Former Smoker -- 1.00 packs/day for 30 years    Types: Cigarettes    Quit date: 08/19/1998  . Smokeless tobacco: Former Systems developer    Types: Chew    Quit date: 12/31/1993  . Alcohol Use: No  . Drug Use: No  . Sexual Activity: No   Other Topics Concern  . Not on file   Social History Narrative    No Known Allergies  Current Outpatient Prescriptions  Medication Sig Dispense Refill  . amLODipine (NORVASC) 10 MG tablet Take 1 tablet (10 mg total) by mouth daily. 30 tablet 3  . aspirin 81 MG tablet Take 81 mg by mouth daily.    Marland Kitchen atorvastatin (LIPITOR) 40 MG tablet Take 1 tablet (40 mg total) by mouth daily at 6 PM. 30 tablet 1  . calcitRIOL (ROCALTROL) 0.5 MCG capsule Take 1 capsule (0.5 mcg total) by mouth daily. 30 capsule 3  . carvedilol (COREG) 25 MG tablet Take 1 tablet (25 mg total) by mouth 2 (two) times daily with a meal. 60 tablet 1  . cloNIDine (CATAPRES) 0.2 MG tablet Take 0.2 mg by  mouth 2 (two) times daily as needed (blood pressure >130/27).   10  . doxycycline (VIBRA-TABS) 100 MG tablet Take 1 tablet (100 mg total) by mouth every 12 (twelve) hours. 14 tablet 0  . feeding supplement, RESOURCE BREEZE, (RESOURCE BREEZE) LIQD Take 1 Container by mouth 3 (three) times daily between meals. 90 Container 5  . gabapentin (NEURONTIN) 100 MG capsule Take 1 capsule (100 mg total) by mouth 3 (three) times daily. (Patient taking differently: Take 100 mg by mouth at bedtime. ) 60 capsule 0  . hydrALAZINE (APRESOLINE) 25 MG tablet Take 1 tablet (25 mg total) by mouth 3 (three) times daily. (Patient taking differently: Take 25 mg by mouth 3 (three) times daily as needed (High blood pressure). ) 90 tablet 3  . isosorbide mononitrate (IMDUR) 30 MG 24 hr tablet Take 0.5 tablets (15 mg total) by mouth at bedtime.  45 tablet 3  . lisinopril (PRINIVIL,ZESTRIL) 10 MG tablet Take 1 tablet by mouth daily.  2  . LUMIGAN 0.01 % SOLN Place 1 drop into both eyes at bedtime.  11  . multivitamin (RENA-VIT) TABS tablet Take 1 tablet by mouth daily.    . Oxycodone HCl 10 MG TABS Take 10 mg by mouth every 6 (six) hours as needed (for pain).   0  . oxyCODONE-acetaminophen (PERCOCET/ROXICET) 5-325 MG per tablet Take 1 tablet by mouth every 6 (six) hours as needed for severe pain. 20 tablet 0  . predniSONE (DELTASONE) 20 MG tablet Take 2 tablets by mouth daily X 2 days; then 1 tablet by mouth daily X 2 days; then 1/2 tablet by mouth daily X 3 days and stop prednisone 9 tablet 0  . sevelamer carbonate (RENVELA) 800 MG tablet Take 1,600-2,400 mg by mouth See admin instructions. Take 3 tablets (2400 mg) with meals and 2 tablets (1600 mg) with snacks    . SIMBRINZA 1-0.2 % SUSP Place 1 drop into both eyes 2 (two) times daily.  11   No current facility-administered medications for this visit.    REVIEW OF SYSTEMS:  [X]  denotes positive finding, [ ]  denotes negative finding Cardiac  Comments:  Chest pain or chest pressure:    Shortness of breath upon exertion:    Short of breath when lying flat:    Irregular heart rhythm:        Vascular    Pain in calf, thigh, or hip brought on by ambulation:    Pain in feet at night that wakes you up from your sleep:  X Left foot  Blood clot in your veins:    Leg swelling:  X Left foot      Pulmonary    Oxygen at home:    Productive cough:     Wheezing:         Neurologic    Sudden weakness in arms or legs:     Sudden numbness in arms or legs:     Sudden onset of difficulty speaking or slurred speech:    Temporary loss of vision in one eye:     Problems with dizziness:         Gastrointestinal    Blood in stool:     Vomited blood:         Genitourinary    Burning when urinating:     Blood in urine:        Psychiatric    Major depression:         Hematologic      Bleeding  problems:    Problems with blood clotting too easily:        Skin    Rashes or ulcers:        Constitutional    Fever or chills:      PHYSICAL EXAM: Filed Vitals:   10/04/15 1134 10/04/15 1137  BP: 154/75 148/72  Pulse: 77   Temp: 97.5 F (36.4 C)   TempSrc: Oral   Height: 5\' 9"  (1.753 m)   Weight: 120 lb (54.432 kg)   SpO2: 98%     GENERAL: The patient is a well-nourished male, in no acute distress. The vital signs are documented above. CARDIAC: There is a regular rate and rhythm. He has a systolic ejection murmur. VASCULAR: I cannot palpate femoral pulses. He has known multilevel arterial occlusive disease. PULMONARY: There is good air exchange bilaterally without wheezing or rales. ABDOMEN: Soft and non-tender with normal pitched bowel sounds.  MUSCULOSKELETAL: He has a right below-the-knee amputation. NEUROLOGIC: No focal weakness or paresthesias are detected. SKIN: he has gangrenous changes to the left great toe. PSYCHIATRIC: The patient has a normal affect.  DATA:  I have reviewed his lower extremity arterial Doppler study from 06/28/2015 which shows monophasic Doppler signals in the dorsalis pedis, posterior tibial and peroneal arteries. The vessels are calcified and not compressible.  MEDICAL ISSUES: Given his multilevel disease and the fact that he has an amputation on the right side, I think the safest approach would be a left above-the-knee amputation. We have discussed the procedure and potential complications in the office in detail today and he is agreeable to proceed. He dialyzes on Monday Wednesdays and Fridays and will arrange for his surgery on a Tuesday, one 10/10/15.   [name] [facility] [place]: [phone]

## 2015-10-09 ENCOUNTER — Encounter (HOSPITAL_COMMUNITY): Payer: Self-pay | Admitting: *Deleted

## 2015-10-09 MED ORDER — DEXTROSE 5 % IV SOLN
1.5000 g | INTRAVENOUS | Status: AC
  Administered 2015-10-10: 1.5 g via INTRAVENOUS
  Filled 2015-10-09: qty 1.5

## 2015-10-09 MED ORDER — CHLORHEXIDINE GLUCONATE CLOTH 2 % EX PADS: 6.0000 | MEDICATED_PAD | Freq: Once | CUTANEOUS | Status: DC

## 2015-10-09 MED ORDER — SODIUM CHLORIDE 0.9 % IV SOLN
INTRAVENOUS | Status: DC
  Administered 2015-10-10: 07:00:00 via INTRAVENOUS

## 2015-10-09 NOTE — Progress Notes (Signed)
Mr Randy [name] reports that he has not had any chest pain in about a month.  Patient reports trhat he does not take Amlodipine any longer.

## 2015-10-09 NOTE — Progress Notes (Signed)
Dr Gifford Shave notified and he reviewed 07/2015 admission, / NSTEMI.  Dr Gifford Shave also was notified of EKG done 09/15/15 at [facility] which interpretation was [place] I, I have requested EKG tracing from [facility].

## 2015-10-10 ENCOUNTER — Encounter (HOSPITAL_COMMUNITY): Payer: Self-pay | Admitting: Certified Registered Nurse Anesthetist

## 2015-10-10 ENCOUNTER — Inpatient Hospital Stay (HOSPITAL_COMMUNITY): Payer: Medicare Other | Admitting: Certified Registered Nurse Anesthetist

## 2015-10-10 ENCOUNTER — Encounter (HOSPITAL_COMMUNITY)
Admission: RE | Disposition: A | Discharge status: Rehabilitation Facility | Payer: Self-pay | Source: Ambulatory Visit | Attending: Vascular Surgery

## 2015-10-10 ENCOUNTER — Inpatient Hospital Stay (HOSPITAL_COMMUNITY)
Admission: RE | Admit: 2015-10-10 | Discharge: 2015-10-12 | DRG: 239 | Disposition: A | Discharge status: Rehabilitation Facility | Payer: Medicare Other | Source: Ambulatory Visit | Attending: Vascular Surgery | Admitting: Vascular Surgery

## 2015-10-10 DIAGNOSIS — G8918 Other acute postprocedural pain: Secondary | ICD-10-CM | POA: Diagnosis not present

## 2015-10-10 DIAGNOSIS — I10 Essential (primary) hypertension: Secondary | ICD-10-CM | POA: Insufficient documentation

## 2015-10-10 DIAGNOSIS — Z992 Dependence on renal dialysis: Secondary | ICD-10-CM | POA: Diagnosis not present

## 2015-10-10 DIAGNOSIS — I120 Hypertensive chronic kidney disease with stage 5 chronic kidney disease or end stage renal disease: Secondary | ICD-10-CM | POA: Diagnosis present

## 2015-10-10 DIAGNOSIS — Z89612 Acquired absence of left leg above knee: Secondary | ICD-10-CM | POA: Diagnosis not present

## 2015-10-10 DIAGNOSIS — G729 Myopathy, unspecified: Secondary | ICD-10-CM | POA: Diagnosis not present

## 2015-10-10 DIAGNOSIS — Z9981 Dependence on supplemental oxygen: Secondary | ICD-10-CM

## 2015-10-10 DIAGNOSIS — K5909 Other constipation: Secondary | ICD-10-CM | POA: Diagnosis present

## 2015-10-10 DIAGNOSIS — J449 Chronic obstructive pulmonary disease, unspecified: Secondary | ICD-10-CM | POA: Diagnosis present

## 2015-10-10 DIAGNOSIS — I428 Other cardiomyopathies: Secondary | ICD-10-CM | POA: Diagnosis present

## 2015-10-10 DIAGNOSIS — N2581 Secondary hyperparathyroidism of renal origin: Secondary | ICD-10-CM | POA: Diagnosis present

## 2015-10-10 DIAGNOSIS — I700 Atherosclerosis of aorta: Secondary | ICD-10-CM | POA: Diagnosis present

## 2015-10-10 DIAGNOSIS — R030 Elevated blood-pressure reading, without diagnosis of hypertension: Secondary | ICD-10-CM | POA: Diagnosis not present

## 2015-10-10 DIAGNOSIS — I739 Peripheral vascular disease, unspecified: Secondary | ICD-10-CM | POA: Diagnosis present

## 2015-10-10 DIAGNOSIS — I7789 Other specified disorders of arteries and arterioles: Secondary | ICD-10-CM | POA: Diagnosis present

## 2015-10-10 DIAGNOSIS — I5042 Chronic combined systolic (congestive) and diastolic (congestive) heart failure: Secondary | ICD-10-CM | POA: Diagnosis present

## 2015-10-10 DIAGNOSIS — F4321 Adjustment disorder with depressed mood: Secondary | ICD-10-CM | POA: Diagnosis not present

## 2015-10-10 DIAGNOSIS — I252 Old myocardial infarction: Secondary | ICD-10-CM | POA: Diagnosis not present

## 2015-10-10 DIAGNOSIS — N189 Chronic kidney disease, unspecified: Secondary | ICD-10-CM | POA: Diagnosis not present

## 2015-10-10 DIAGNOSIS — Z89511 Acquired absence of right leg below knee: Secondary | ICD-10-CM

## 2015-10-10 DIAGNOSIS — N186 End stage renal disease: Secondary | ICD-10-CM | POA: Insufficient documentation

## 2015-10-10 DIAGNOSIS — R269 Unspecified abnormalities of gait and mobility: Secondary | ICD-10-CM | POA: Diagnosis not present

## 2015-10-10 DIAGNOSIS — M79672 Pain in left foot: Secondary | ICD-10-CM | POA: Diagnosis present

## 2015-10-10 DIAGNOSIS — I70203 Unspecified atherosclerosis of native arteries of extremities, bilateral legs: Secondary | ICD-10-CM | POA: Diagnosis present

## 2015-10-10 DIAGNOSIS — I2510 Atherosclerotic heart disease of native coronary artery without angina pectoris: Secondary | ICD-10-CM | POA: Diagnosis present

## 2015-10-10 DIAGNOSIS — K5903 Drug induced constipation: Secondary | ICD-10-CM | POA: Diagnosis not present

## 2015-10-10 DIAGNOSIS — Q613 Polycystic kidney, unspecified: Secondary | ICD-10-CM

## 2015-10-10 DIAGNOSIS — D500 Iron deficiency anemia secondary to blood loss (chronic): Secondary | ICD-10-CM | POA: Diagnosis not present

## 2015-10-10 DIAGNOSIS — D62 Acute posthemorrhagic anemia: Secondary | ICD-10-CM | POA: Diagnosis not present

## 2015-10-10 DIAGNOSIS — D638 Anemia in other chronic diseases classified elsewhere: Secondary | ICD-10-CM | POA: Diagnosis present

## 2015-10-10 DIAGNOSIS — I998 Other disorder of circulatory system: Secondary | ICD-10-CM | POA: Diagnosis not present

## 2015-10-10 DIAGNOSIS — Z8673 Personal history of transient ischemic attack (TIA), and cerebral infarction without residual deficits: Secondary | ICD-10-CM | POA: Diagnosis not present

## 2015-10-10 DIAGNOSIS — Z7982 Long term (current) use of aspirin: Secondary | ICD-10-CM

## 2015-10-10 DIAGNOSIS — S88111S Complete traumatic amputation at level between knee and ankle, right lower leg, sequela: Secondary | ICD-10-CM | POA: Diagnosis not present

## 2015-10-10 DIAGNOSIS — E7800 Pure hypercholesterolemia, unspecified: Secondary | ICD-10-CM | POA: Diagnosis present

## 2015-10-10 DIAGNOSIS — Z89611 Acquired absence of right leg above knee: Secondary | ICD-10-CM | POA: Diagnosis not present

## 2015-10-10 DIAGNOSIS — I5043 Acute on chronic combined systolic (congestive) and diastolic (congestive) heart failure: Secondary | ICD-10-CM | POA: Diagnosis not present

## 2015-10-10 DIAGNOSIS — R072 Precordial pain: Secondary | ICD-10-CM | POA: Diagnosis not present

## 2015-10-10 HISTORY — PX: ABOVE KNEE LEG AMPUTATION: SUR20

## 2015-10-10 HISTORY — PX: AMPUTATION: SHX166

## 2015-10-10 LAB — CBC
HCT: 38.2 % — ABNORMAL LOW (ref 39.0–52.0)
Hemoglobin: 11.3 g/dL — ABNORMAL LOW (ref 13.0–17.0)
MCH: 29.4 pg (ref 26.0–34.0)
MCHC: 29.6 g/dL — ABNORMAL LOW (ref 30.0–36.0)
MCV: 99.5 fL (ref 78.0–100.0)
PLATELETS: 231 10*3/uL (ref 150–400)
RBC: 3.84 MIL/uL — AB (ref 4.22–5.81)
RDW: 18.6 % — AB (ref 11.5–15.5)
WBC: 6.6 10*3/uL (ref 4.0–10.5)

## 2015-10-10 LAB — COMPREHENSIVE METABOLIC PANEL
ALT: 9 U/L — AB (ref 17–63)
AST: 18 U/L (ref 15–41)
Albumin: 2.8 g/dL — ABNORMAL LOW (ref 3.5–5.0)
Alkaline Phosphatase: 56 U/L (ref 38–126)
Anion gap: 14 (ref 5–15)
BUN: 40 mg/dL — AB (ref 6–20)
CHLORIDE: 98 mmol/L — AB (ref 101–111)
CO2: 28 mmol/L (ref 22–32)
Calcium: 9.6 mg/dL (ref 8.9–10.3)
Creatinine, Ser: 8.61 mg/dL — ABNORMAL HIGH (ref 0.61–1.24)
GFR, EST AFRICAN AMERICAN: 6 mL/min — AB (ref >60)
GFR, EST NON AFRICAN AMERICAN: 5 mL/min — AB (ref >60)
Glucose, Bld: 101 mg/dL — ABNORMAL HIGH (ref 65–99)
POTASSIUM: 4.5 mmol/L (ref 3.5–5.1)
SODIUM: 140 mmol/L (ref 135–145)
Total Bilirubin: 0.6 mg/dL (ref 0.3–1.2)
Total Protein: 6.6 g/dL (ref 6.5–8.1)

## 2015-10-10 SURGERY — AMPUTATION, ABOVE KNEE
Anesthesia: General | Laterality: Left

## 2015-10-10 MED ORDER — HYDRALAZINE HCL 20 MG/ML IJ SOLN: 5.0000 mg | INTRAMUSCULAR | Status: DC | PRN

## 2015-10-10 MED ORDER — ACETAMINOPHEN 325 MG RE SUPP: 325.0000 mg | RECTAL | Status: DC | PRN

## 2015-10-10 MED ORDER — ONDANSETRON HCL 4 MG/2ML IJ SOLN
INTRAMUSCULAR | Status: DC | PRN
  Administered 2015-10-10: 4 mg via INTRAVENOUS

## 2015-10-10 MED ORDER — MAGNESIUM SULFATE 2 GM/50ML IV SOLN: 2.0000 g | Freq: Once | INTRAVENOUS | Status: DC | PRN

## 2015-10-10 MED ORDER — KCL IN DEXTROSE-NACL 20-5-0.45 MEQ/L-%-% IV SOLN
INTRAVENOUS | Status: DC
  Administered 2015-10-10: 15:00:00 via INTRAVENOUS
  Filled 2015-10-10: qty 1000

## 2015-10-10 MED ORDER — OXYCODONE HCL 10 MG PO TABS: 10.0000 mg | ORAL_TABLET | Freq: Four times a day (QID) | ORAL | Status: DC | PRN

## 2015-10-10 MED ORDER — CARVEDILOL 25 MG PO TABS
25.0000 mg | ORAL_TABLET | Freq: Two times a day (BID) | ORAL | Status: DC
  Administered 2015-10-10 – 2015-10-12 (×5): 25 mg via ORAL
  Filled 2015-10-10 (×5): qty 1

## 2015-10-10 MED ORDER — ASPIRIN 81 MG PO CHEW
81.0000 mg | CHEWABLE_TABLET | Freq: Every day | ORAL | Status: DC
  Administered 2015-10-10 – 2015-10-12 (×3): 81 mg via ORAL
  Filled 2015-10-10 (×3): qty 1

## 2015-10-10 MED ORDER — MEPERIDINE HCL 25 MG/ML IJ SOLN: 6.2500 mg | INTRAMUSCULAR | Status: DC | PRN

## 2015-10-10 MED ORDER — BOOST / RESOURCE BREEZE PO LIQD
1.0000 | Freq: Three times a day (TID) | ORAL | Status: DC
  Administered 2015-10-10 – 2015-10-11 (×4): 1 via ORAL

## 2015-10-10 MED ORDER — GABAPENTIN 100 MG PO CAPS
100.0000 mg | ORAL_CAPSULE | Freq: Every day | ORAL | Status: DC
  Administered 2015-10-10 – 2015-10-11 (×2): 100 mg via ORAL
  Filled 2015-10-10 (×2): qty 1

## 2015-10-10 MED ORDER — HYDRALAZINE HCL 25 MG PO TABS: 25.0000 mg | ORAL_TABLET | Freq: Three times a day (TID) | ORAL | Status: DC | PRN

## 2015-10-10 MED ORDER — DEXTROSE 5 % IV SOLN
1.5000 g | Freq: Two times a day (BID) | INTRAVENOUS | Status: AC
  Administered 2015-10-11: 1.5 g via INTRAVENOUS
  Filled 2015-10-10: qty 1.5

## 2015-10-10 MED ORDER — DOXYCYCLINE HYCLATE 100 MG PO TABS
100.0000 mg | ORAL_TABLET | Freq: Two times a day (BID) | ORAL | Status: DC
  Administered 2015-10-10 – 2015-10-12 (×5): 100 mg via ORAL
  Filled 2015-10-10 (×5): qty 1

## 2015-10-10 MED ORDER — GUAIFENESIN-DM 100-10 MG/5ML PO SYRP: 15.0000 mL | ORAL_SOLUTION | ORAL | Status: DC | PRN

## 2015-10-10 MED ORDER — SEVELAMER CARBONATE 800 MG PO TABS: 1600.0000 mg | ORAL_TABLET | ORAL | Status: DC | PRN

## 2015-10-10 MED ORDER — LABETALOL HCL 5 MG/ML IV SOLN
10.0000 mg | INTRAVENOUS | Status: DC | PRN
  Filled 2015-10-10: qty 4

## 2015-10-10 MED ORDER — OXYCODONE-ACETAMINOPHEN 5-325 MG PO TABS: 1.0000 | ORAL_TABLET | Freq: Four times a day (QID) | ORAL | Status: DC | PRN

## 2015-10-10 MED ORDER — PHENYLEPHRINE HCL 10 MG/ML IJ SOLN
INTRAMUSCULAR | Status: DC | PRN
  Administered 2015-10-10 (×2): 40 ug via INTRAVENOUS
  Administered 2015-10-10 (×2): 20 ug via INTRAVENOUS

## 2015-10-10 MED ORDER — LIDOCAINE HCL (CARDIAC) 20 MG/ML IV SOLN
INTRAVENOUS | Status: DC | PRN
  Administered 2015-10-10: 100 mg via INTRAVENOUS

## 2015-10-10 MED ORDER — ONDANSETRON HCL 4 MG/2ML IJ SOLN: 4.0000 mg | Freq: Once | INTRAMUSCULAR | Status: DC | PRN

## 2015-10-10 MED ORDER — ONDANSETRON HCL 4 MG/2ML IJ SOLN: 4.0000 mg | Freq: Four times a day (QID) | INTRAMUSCULAR | Status: DC | PRN

## 2015-10-10 MED ORDER — AMLODIPINE BESYLATE 10 MG PO TABS
10.0000 mg | ORAL_TABLET | Freq: Every day | ORAL | Status: DC
  Administered 2015-10-10: 10 mg via ORAL
  Filled 2015-10-10: qty 1

## 2015-10-10 MED ORDER — BACITRACIN ZINC 500 UNIT/GM EX OINT
TOPICAL_OINTMENT | CUTANEOUS | Status: AC
  Filled 2015-10-10: qty 28.35

## 2015-10-10 MED ORDER — CLONIDINE HCL 0.2 MG PO TABS
0.2000 mg | ORAL_TABLET | Freq: Two times a day (BID) | ORAL | Status: DC | PRN
  Administered 2015-10-10: 0.2 mg via ORAL
  Filled 2015-10-10: qty 1

## 2015-10-10 MED ORDER — DEXTROSE 5 % IV SOLN
10.0000 mg | INTRAVENOUS | Status: DC | PRN
  Administered 2015-10-10: 10 ug/min via INTRAVENOUS

## 2015-10-10 MED ORDER — DOCUSATE SODIUM 100 MG PO CAPS
100.0000 mg | ORAL_CAPSULE | Freq: Every day | ORAL | Status: DC
  Administered 2015-10-11 – 2015-10-12 (×2): 100 mg via ORAL
  Filled 2015-10-10 (×3): qty 1

## 2015-10-10 MED ORDER — HYDROMORPHONE HCL 1 MG/ML IJ SOLN
INTRAMUSCULAR | Status: AC
  Administered 2015-10-10: 0.25 mg via INTRAVENOUS
  Filled 2015-10-10: qty 1

## 2015-10-10 MED ORDER — DARBEPOETIN ALFA 100 MCG/0.5ML IJ SOSY: 100.0000 ug | PREFILLED_SYRINGE | INTRAMUSCULAR | Status: DC

## 2015-10-10 MED ORDER — SEVELAMER CARBONATE 800 MG PO TABS
2400.0000 mg | ORAL_TABLET | Freq: Three times a day (TID) | ORAL | Status: DC
  Administered 2015-10-10 – 2015-10-12 (×6): 2400 mg via ORAL
  Filled 2015-10-10 (×6): qty 3

## 2015-10-10 MED ORDER — SODIUM CHLORIDE 0.9 % IV SOLN
125.0000 mg | INTRAVENOUS | Status: DC
  Administered 2015-10-11: 125 mg via INTRAVENOUS
  Filled 2015-10-10 (×2): qty 10

## 2015-10-10 MED ORDER — PROPOFOL 10 MG/ML IV BOLUS
INTRAVENOUS | Status: DC | PRN
  Administered 2015-10-10: 100 mg via INTRAVENOUS

## 2015-10-10 MED ORDER — METOPROLOL TARTRATE 1 MG/ML IV SOLN
2.0000 mg | INTRAVENOUS | Status: DC | PRN
Start: 2015-10-10 — End: 2015-10-12

## 2015-10-10 MED ORDER — ALUM & MAG HYDROXIDE-SIMETH 200-200-20 MG/5ML PO SUSP: 15.0000 mL | ORAL | Status: DC | PRN

## 2015-10-10 MED ORDER — CEFUROXIME SODIUM 1.5 G IJ SOLR
1.5000 g | Freq: Two times a day (BID) | INTRAMUSCULAR | Status: DC
  Filled 2015-10-10: qty 1.5

## 2015-10-10 MED ORDER — LISINOPRIL 10 MG PO TABS
10.0000 mg | ORAL_TABLET | Freq: Every day | ORAL | Status: DC
  Administered 2015-10-10: 10 mg via ORAL
  Filled 2015-10-10: qty 1

## 2015-10-10 MED ORDER — CLONIDINE HCL 0.2 MG PO TABS
0.2000 mg | ORAL_TABLET | Freq: Once | ORAL | Status: DC
  Filled 2015-10-10 (×2): qty 1

## 2015-10-10 MED ORDER — ENOXAPARIN SODIUM 30 MG/0.3ML ~~LOC~~ SOLN
30.0000 mg | SUBCUTANEOUS | Status: DC
  Administered 2015-10-12: 30 mg via SUBCUTANEOUS
  Filled 2015-10-10: qty 0.3

## 2015-10-10 MED ORDER — CLONIDINE HCL (ANALGESIA) 100 MCG/ML EP SOLN
150.0000 ug | Freq: Once | EPIDURAL | Status: DC
  Filled 2015-10-10: qty 1.5

## 2015-10-10 MED ORDER — FENTANYL CITRATE (PF) 100 MCG/2ML IJ SOLN
INTRAMUSCULAR | Status: DC | PRN
  Administered 2015-10-10: 25 ug via INTRAVENOUS
  Administered 2015-10-10: 50 ug via INTRAVENOUS
  Administered 2015-10-10: 100 ug via INTRAVENOUS

## 2015-10-10 MED ORDER — LATANOPROST 0.005 % OP SOLN
1.0000 [drp] | Freq: Every day | OPHTHALMIC | Status: DC
  Administered 2015-10-10 – 2015-10-11 (×2): 1 [drp] via OPHTHALMIC
  Filled 2015-10-10: qty 2.5

## 2015-10-10 MED ORDER — LISINOPRIL 10 MG PO TABS
10.0000 mg | ORAL_TABLET | Freq: Every day | ORAL | Status: DC
  Administered 2015-10-11: 10 mg via ORAL
  Filled 2015-10-10: qty 1

## 2015-10-10 MED ORDER — ATORVASTATIN CALCIUM 40 MG PO TABS
40.0000 mg | ORAL_TABLET | Freq: Every day | ORAL | Status: DC
  Administered 2015-10-10 – 2015-10-12 (×3): 40 mg via ORAL
  Filled 2015-10-10 (×3): qty 1

## 2015-10-10 MED ORDER — ISOSORBIDE MONONITRATE ER 30 MG PO TB24
15.0000 mg | ORAL_TABLET | Freq: Every day | ORAL | Status: DC
  Administered 2015-10-10 – 2015-10-11 (×2): 15 mg via ORAL
  Filled 2015-10-10 (×2): qty 1

## 2015-10-10 MED ORDER — FENTANYL CITRATE (PF) 250 MCG/5ML IJ SOLN
INTRAMUSCULAR | Status: AC
  Filled 2015-10-10: qty 5

## 2015-10-10 MED ORDER — CALCITRIOL 0.5 MCG PO CAPS
0.5000 ug | ORAL_CAPSULE | Freq: Every day | ORAL | Status: DC
  Administered 2015-10-10 – 2015-10-12 (×3): 0.5 ug via ORAL
  Filled 2015-10-10 (×3): qty 1

## 2015-10-10 MED ORDER — AMLODIPINE BESYLATE 10 MG PO TABS
10.0000 mg | ORAL_TABLET | Freq: Every day | ORAL | Status: DC
  Administered 2015-10-11: 10 mg via ORAL
  Filled 2015-10-10: qty 1

## 2015-10-10 MED ORDER — OXYCODONE HCL 5 MG PO TABS
10.0000 mg | ORAL_TABLET | Freq: Four times a day (QID) | ORAL | Status: DC | PRN
  Administered 2015-10-11 – 2015-10-12 (×3): 10 mg via ORAL
  Filled 2015-10-10 (×2): qty 2

## 2015-10-10 MED ORDER — POTASSIUM CHLORIDE CRYS ER 20 MEQ PO TBCR: 20.0000 meq | EXTENDED_RELEASE_TABLET | Freq: Once | ORAL | Status: DC | PRN

## 2015-10-10 MED ORDER — HYDROMORPHONE HCL 1 MG/ML IJ SOLN
0.2500 mg | INTRAMUSCULAR | Status: DC | PRN
  Administered 2015-10-10 (×3): 0.25 mg via INTRAVENOUS

## 2015-10-10 MED ORDER — PANTOPRAZOLE SODIUM 40 MG PO TBEC
40.0000 mg | DELAYED_RELEASE_TABLET | Freq: Every day | ORAL | Status: DC
  Administered 2015-10-10 – 2015-10-12 (×3): 40 mg via ORAL
  Filled 2015-10-10 (×3): qty 1

## 2015-10-10 MED ORDER — BACITRACIN ZINC 500 UNIT/GM EX OINT
TOPICAL_OINTMENT | CUTANEOUS | Status: DC | PRN
Start: 2015-10-10 — End: 2015-10-10
  Administered 2015-10-10: 1 via TOPICAL

## 2015-10-10 MED ORDER — CARVEDILOL 25 MG PO TABS
25.0000 mg | ORAL_TABLET | Freq: Once | ORAL | Status: AC
  Administered 2015-10-10: 25 mg via ORAL
  Filled 2015-10-10: qty 2
  Filled 2015-10-10: qty 1

## 2015-10-10 MED ORDER — OXYCODONE-ACETAMINOPHEN 5-325 MG PO TABS
1.0000 | ORAL_TABLET | ORAL | Status: DC | PRN
  Administered 2015-10-10 – 2015-10-11 (×4): 2 via ORAL
  Administered 2015-10-11: 1 via ORAL
  Filled 2015-10-10 (×4): qty 2

## 2015-10-10 MED ORDER — 0.9 % SODIUM CHLORIDE (POUR BTL) OPTIME
TOPICAL | Status: DC | PRN
  Administered 2015-10-10: 1000 mL

## 2015-10-10 MED ORDER — ACETAMINOPHEN 325 MG PO TABS
325.0000 mg | ORAL_TABLET | ORAL | Status: DC | PRN
  Administered 2015-10-12: 650 mg via ORAL
  Filled 2015-10-10: qty 2

## 2015-10-10 MED ORDER — ASPIRIN 81 MG PO TABS: 81.0000 mg | ORAL_TABLET | Freq: Every day | ORAL | Status: DC

## 2015-10-10 MED ORDER — HYDROMORPHONE HCL 1 MG/ML IJ SOLN
0.5000 mg | INTRAMUSCULAR | Status: DC | PRN
  Administered 2015-10-10 – 2015-10-12 (×9): 1 mg via INTRAVENOUS
  Filled 2015-10-10 (×9): qty 1

## 2015-10-10 MED ORDER — RENA-VITE PO TABS
1.0000 | ORAL_TABLET | Freq: Every day | ORAL | Status: DC
  Administered 2015-10-10 – 2015-10-12 (×3): 1 via ORAL
  Filled 2015-10-10 (×3): qty 1

## 2015-10-10 MED ORDER — PHENOL 1.4 % MT LIQD
1.0000 | OROMUCOSAL | Status: DC | PRN
Start: 2015-10-10 — End: 2015-10-12

## 2015-10-10 MED ORDER — PROPOFOL 10 MG/ML IV BOLUS
INTRAVENOUS | Status: AC
  Filled 2015-10-10: qty 20

## 2015-10-10 SURGICAL SUPPLY — 50 items
BANDAGE ELASTIC 4 VELCRO ST LF (GAUZE/BANDAGES/DRESSINGS) ×3 IMPLANT
BANDAGE ESMARK 6X9 LF (GAUZE/BANDAGES/DRESSINGS) ×1 IMPLANT
BLADE SAW RECIP 87.9 MT (BLADE) ×3 IMPLANT
BNDG COHESIVE 6X5 TAN STRL LF (GAUZE/BANDAGES/DRESSINGS) ×3 IMPLANT
BNDG ESMARK 6X9 LF (GAUZE/BANDAGES/DRESSINGS) ×3
BNDG GAUZE ELAST 4 BULKY (GAUZE/BANDAGES/DRESSINGS) ×3 IMPLANT
CANISTER SUCTION 2500CC (MISCELLANEOUS) ×3 IMPLANT
CLIP TI MEDIUM 6 (CLIP) ×3 IMPLANT
COVER SURGICAL LIGHT HANDLE (MISCELLANEOUS) ×3 IMPLANT
CUFF TOURNIQUET SINGLE 24IN (TOURNIQUET CUFF) ×3 IMPLANT
DRAPE ORTHO SPLIT 77X108 STRL (DRAPES) ×4
DRAPE PROXIMA HALF (DRAPES) ×3 IMPLANT
DRAPE SURG ORHT 6 SPLT 77X108 (DRAPES) ×2 IMPLANT
DRAPE U-SHAPE 47X51 STRL (DRAPES) ×3 IMPLANT
DRSG ADAPTIC 3X8 NADH LF (GAUZE/BANDAGES/DRESSINGS) ×3 IMPLANT
ELECT REM PT RETURN 9FT ADLT (ELECTROSURGICAL) ×3
ELECTRODE REM PT RTRN 9FT ADLT (ELECTROSURGICAL) ×1 IMPLANT
GAUZE SPONGE 4X4 12PLY STRL (GAUZE/BANDAGES/DRESSINGS) ×3 IMPLANT
GLOVE BIO SURGEON STRL SZ 6.5 (GLOVE) ×2 IMPLANT
GLOVE BIO SURGEON STRL SZ7.5 (GLOVE) ×3 IMPLANT
GLOVE BIO SURGEONS STRL SZ 6.5 (GLOVE) ×1
GLOVE BIOGEL PI IND STRL 6.5 (GLOVE) ×2 IMPLANT
GLOVE BIOGEL PI IND STRL 7.0 (GLOVE) ×1 IMPLANT
GLOVE BIOGEL PI IND STRL 8 (GLOVE) ×2 IMPLANT
GLOVE BIOGEL PI INDICATOR 6.5 (GLOVE) ×4
GLOVE BIOGEL PI INDICATOR 7.0 (GLOVE) ×2
GLOVE BIOGEL PI INDICATOR 8 (GLOVE) ×4
GLOVE ECLIPSE 6.5 STRL STRAW (GLOVE) ×3 IMPLANT
GLOVE ORTHO TXT STRL SZ7.5 (GLOVE) ×3 IMPLANT
GOWN STRL REUS W/ TWL LRG LVL3 (GOWN DISPOSABLE) ×3 IMPLANT
GOWN STRL REUS W/TWL LRG LVL3 (GOWN DISPOSABLE) ×6
KIT BASIN OR (CUSTOM PROCEDURE TRAY) ×3 IMPLANT
KIT ROOM TURNOVER OR (KITS) ×3 IMPLANT
NS IRRIG 1000ML POUR BTL (IV SOLUTION) ×3 IMPLANT
PACK GENERAL/GYN (CUSTOM PROCEDURE TRAY) ×3 IMPLANT
PAD ARMBOARD 7.5X6 YLW CONV (MISCELLANEOUS) ×6 IMPLANT
SPONGE GAUZE 4X4 12PLY STER LF (GAUZE/BANDAGES/DRESSINGS) ×3 IMPLANT
STAPLER VISISTAT (STAPLE) ×3 IMPLANT
STOCKINETTE IMPERVIOUS LG (DRAPES) ×3 IMPLANT
SUT SILK 0 TIES 10X30 (SUTURE) ×3 IMPLANT
SUT SILK 2 0 (SUTURE) ×2
SUT SILK 2 0 SH CR/8 (SUTURE) ×3 IMPLANT
SUT SILK 2-0 18XBRD TIE 12 (SUTURE) ×1 IMPLANT
SUT SILK 3 0 (SUTURE) ×2
SUT SILK 3-0 18XBRD TIE 12 (SUTURE) ×1 IMPLANT
SUT VIC AB 2-0 CT1 18 (SUTURE) ×3 IMPLANT
TOWEL OR 17X24 6PK STRL BLUE (TOWEL DISPOSABLE) ×3 IMPLANT
TOWEL OR 17X26 10 PK STRL BLUE (TOWEL DISPOSABLE) ×6 IMPLANT
UNDERPAD 30X30 INCONTINENT (UNDERPADS AND DIAPERS) ×3 IMPLANT
WATER STERILE IRR 1000ML POUR (IV SOLUTION) ×3 IMPLANT

## 2015-10-10 NOTE — Anesthesia Procedure Notes (Signed)
Procedure Name: LMA Insertion Date/Time: 10/10/2015 7:42 AM Performed by: Merdis Delay Pre-anesthesia Checklist: Patient identified, Timeout performed, Emergency Drugs available, Suction available and Patient being monitored Patient Re-evaluated:Patient Re-evaluated prior to inductionOxygen Delivery Method: Circle system utilized Preoxygenation: Pre-oxygenation with 100% oxygen Intubation Type: IV induction LMA: LMA inserted LMA Size: 5.0 Number of attempts: 1 Placement Confirmation: positive ETCO2,  CO2 detector and breath sounds checked- equal and bilateral Tube secured with: Tape Dental Injury: Teeth and Oropharynx as per pre-operative assessment

## 2015-10-10 NOTE — Op Note (Signed)
    NAME: [name]   MRN: [id number] DOB: Jan 24, 1932    DATE OF OPERATION: 10/10/2015  PREOP DIAGNOSIS: ischemic left leg  POSTOP DIAGNOSIS: same  PROCEDURE: Left above-the-knee amputation  SURGEON: Judeth Cornfield. Scot Dock, MD, FACS  ASSIST: Yolanda Bonine RNFA  ANESTHESIA: Gen.   EBL: minimal  INDICATIONS: Randy [name] is a 82 y.o. male who presented with progressive ischemia of the left leg and a nonhealing wound. He was not a candidate for revascularization. He presents for left above-the-knee amputation as he has a below-the-knee amputation on the right and is not a candidate for bilateral prostheses.  FINDINGS: the muscle was well perfused. There were no signs of infection.  TECHNIQUE: The patient was taken to the operating room and received a general anesthetic. The left lower extremity was prepped and draped in usual sterile fashion. A tourniquet was placed on the upper thigh. A fishmouth incision was marked above the level of the patella. The leg was exsanguinated with an Esmarch bandage and the tourniquet inflated to 300 mmHg. Under tourniquet control, the incision was carried down through the skin, subcutaneous tissue, fascia, and muscle to the femur which was dissected free circumferentially. The periosteum was elevated and the bone divided proximal to the level of skin division. The femoral artery and vein were individually suture ligated with 2-0 silk ties. The saphenous vein was ligated with a 2-0 silk tie. The tourniquet was then released. Additional hemostasis was obtained using 2-0 silk ties and electrocautery. The edges of the bone were rasped. The wound was irrigated with copious amounts of saline. The fascial layer was closed the deep layer of 2-0 Vicryl. The skin was closed with staples. A sterile dressing was applied. The patient tolerated the procedure well and was transferred to the recovery room in stable condition. All needle and sponge counts were  correct.  [name], MD, FACS [facility]  DATE OF DICTATION:   10/10/2015

## 2015-10-10 NOTE — Progress Notes (Signed)
PT Cancellation Note  Patient Details Name: Randy [name] MRN: [id number] DOB: 1931-12-08   Cancelled Treatment:    Reason Eval/Treat Not Completed: Patient declined, no reason specified Pt declined working with PT this afternoon, "I don't think so, just don't feel like it." Will follow up.   Marguarite Arbour A Milda Lindvall 10/10/2015, 3:07 PM Wray Kearns, [place], DPT [phone]

## 2015-10-10 NOTE — Transfer of Care (Signed)
Immediate Anesthesia Transfer of Care Note  Patient: Randy [name]  Procedure(s) Performed: Procedure(s): AMPUTATION ABOVE KNEE LEFT (Left)  Patient Location: PACU  Anesthesia Type:General  Level of Consciousness: awake, alert  and oriented  Airway & Oxygen Therapy: Patient Spontanous Breathing and Patient connected to face mask oxygen  Post-op Assessment: Report given to RN and Post -op Vital signs reviewed and stable  Post vital signs: Reviewed and stable  Last Vitals:  Filed Vitals:   10/10/15 0618 10/10/15 0838  BP: 198/76   Pulse: 81   Temp: 37.1 C 37.2 C  Resp: 16     Complications: No apparent anesthesia complications

## 2015-10-10 NOTE — Interval H&P Note (Signed)
History and Physical Interval Note:  10/10/2015 7:18 AM  Randy [name]  has presented today for surgery, with the diagnosis of Left foot pain M79.672  The various methods of treatment have been discussed with the patient and family. After consideration of risks, benefits and other options for treatment, the patient has consented to  Procedure(s): AMPUTATION ABOVE KNEE (Left) as a surgical intervention .  [name] history has been reviewed, patient examined, no change in status, stable for surgery.  I have reviewed the patient's chart and labs.  Questions were answered to the patient's satisfaction.     [name]

## 2015-10-10 NOTE — Consult Note (Signed)
Physical Medicine and Rehabilitation Consult  Reason for Consult:  L-AKA due to PVD and h/o R-BKA Referring Physician:  Dr. Donnetta Hutching.   HPI: Randy [name] is a 82 y.o. 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 Patient elected to undergo L-AKA on 10/10/15 by Dr. Donnetta Hutching. Post op has had bleeding from stump requiring dressing changes. PT/OT evaluations to be done today and CIR consulted in anticipation of extensive rehab needs.  Patient was independent at wheelchair level with right prosthesis. He was able to perform stand pivot transfers independently and occasionally able to take a couple of steps.  Wife supportive and can provide supervision after discharge.   Review of Systems  HENT: Negative for hearing loss.   Eyes: Negative for blurred vision and double vision.  Respiratory: Positive for shortness of breath (some with activity). Negative for cough.   Cardiovascular: Negative for palpitations.  Gastrointestinal: Positive for heartburn and constipation. Negative for nausea and abdominal pain.  Musculoskeletal: Positive for myalgias. Negative for joint pain and falls.       Spasms with HD  Skin: Positive for itching (occasionally). Negative for rash.  Neurological: Positive for sensory change (still has occasional phantom pain right residual limb). Negative for dizziness, focal weakness and headaches.  Psychiatric/Behavioral: Negative for depression. The patient does not have insomnia.   All other systems reviewed and are negative.    Past Medical History  Diagnosis Date  . Hypercholesterolemia   . Gout   . Nonischemic cardiomyopathy (HCC)     LVEF 35-40%  . Anemia of chronic disease   . Essential hypertension   . Arthritis   . Peripheral vascular disease ([place])     Status post right below knee amputation for a nonhealing wound of the  right foot 12/2014  . History of pneumonia 2014  . CAD (coronary artery disease)     Moderate multivessel disease 07/2015 - managed medically  . History of myopericarditis 2015    07/2015  . Stroke [facility])     "they said I did"  "I did not know anything about it"  . ESRD on hemodialysis [facility])     M/W/F in [place] - Dr. Florene Glen  . Constipation   . History of blood transfusion   . Pneumonia   . Acute myopericarditis 2015    Past Surgical History  Procedure Laterality Date  . Knee arthroscopy Right 2007  . Back surgery    . Hemorrhoid surgery  1970's  . Ganglion cyst excision  01/03/2012    Procedure: REMOVAL GANGLION OF WRIST;  Surgeon: Scherry Ran, MD;  Location: [facility];  Service: General;  Laterality: Right;  . Colonoscopy    . Av fistula placement  08/24/2012    Procedure: ARTERIOVENOUS (AV) FISTULA CREATION;  Surgeon: Rosetta Posner, MD;  Location: [place];  Service: Vascular;  Laterality: Left;  . Insertion of dialysis catheter Right      neck  . Insertion of dialysis catheter  10/19/2012    Procedure: INSERTION OF DIALYSIS CATHETER;  Surgeon: Rosetta Posner, MD;  Location: [place];  Service: Vascular;  Laterality: N/A;  REMOVE TEMPORARY CATH  . Cholecystectomy    . Lumbar disc surgery  2004  . Left heart catheterization with coronary angiogram N/A 08/17/2014    Procedure: LEFT HEART CATHETERIZATION WITH CORONARY ANGIOGRAM;  Surgeon: Larey Dresser, MD;  Location: [place] CATH LAB;  Service: Cardiovascular;  Laterality: N/A;  . Colonoscopy Left 09/26/2014    Procedure: COLONOSCOPY;  Surgeon: Arta Silence, MD;  Location: [facility] ENDOSCOPY;  Service: Endoscopy;  Laterality: Left;  . Amputation Right 01/05/2015    Procedure: AMPUTATION BELOW KNEE;  Surgeon: Angelia Mould, MD;  Location: [place];  Service: Vascular;  Laterality: Right;  . Capd removal N/A 03/13/2015    Procedure: CONTINUOUS AMBULATORY PERITONEAL DIALYSIS  (CAPD) CATHETER REMOVAL;  Surgeon: Coralie Keens, MD;  Location:  [place];  Service: General;  Laterality: N/A;  . Cardiac catheterization    . Above knee leg amputation Left 10/10/2015    Family History  Problem Relation Age of Onset  . Arthritis    . Cancer    . Kidney disease    . Anesthesia problems Neg Hx   . Hypotension Neg Hx   . Malignant hyperthermia Neg Hx   . Pseudochol deficiency Neg Hx   . Cancer Sister   . Colon cancer Brother   . Colon cancer Brother      Social History:  Married. 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 Wife at home can provide supervsion after discharge. Niece provides transportation to HD. He reports that he quit smoking about 17 years ago. His smoking use included Cigarettes. He has a 30 pack-year smoking history. He quit smokeless tobacco use about 21 years ago. His smokeless tobacco use included Chew. He reports that he does not drink alcohol or use illicit drugs.    Allergies: No Known Allergies    Medications Prior to Admission  Medication Sig Dispense Refill  . amLODipine (NORVASC) 10 MG tablet Take 1 tablet (10 mg total) by mouth daily. 30 tablet 3  . aspirin 81 MG tablet Take 81 mg by mouth daily.    Marland Kitchen atorvastatin (LIPITOR) 40 MG tablet Take 1 tablet (40 mg total) by mouth daily at 6 PM. 30 tablet 1  . calcitRIOL (ROCALTROL) 0.5 MCG capsule Take 1 capsule (0.5 mcg total) by mouth daily. 30 capsule 3  . carvedilol (COREG) 25 MG tablet Take 1 tablet (25 mg total) by mouth 2 (two) times daily with a meal. 60 tablet 1  . cloNIDine (CATAPRES) 0.2 MG tablet Take 0.2 mg by mouth 2 (two) times daily as needed (blood pressure >130/27).   10  . doxycycline (VIBRA-TABS) 100 MG tablet Take 1 tablet (100 mg total) by mouth every 12 (twelve) hours. 14 tablet 0  . feeding supplement, RESOURCE BREEZE, (RESOURCE BREEZE) LIQD Take 1 Container by mouth 3 (three) times daily between meals. 90 Container 5  . gabapentin (NEURONTIN) 100 MG capsule Take 1 capsule (100 mg total) by mouth 3 (three) times daily. (Patient  taking differently: Take 100 mg by mouth at bedtime. ) 60 capsule 0  . hydrALAZINE (APRESOLINE) 25 MG tablet Take 1 tablet (25 mg total) by mouth 3 (three) times daily. (Patient taking differently: Take 25 mg by mouth 3 (three) times daily as needed (High blood pressure). ) 90 tablet 3  . isosorbide mononitrate (IMDUR) 30 MG 24 hr tablet Take 0.5 tablets (15 mg total) by mouth at bedtime. 45 tablet 3  . lisinopril (PRINIVIL,ZESTRIL) 10 MG tablet Take 1 tablet by mouth daily.  2  . LUMIGAN 0.01 % SOLN Place 1 drop into both eyes at bedtime.  11  . multivitamin (RENA-VIT) TABS tablet Take 1 tablet by mouth daily.    . Oxycodone HCl 10 MG TABS Take 10 mg  by mouth every 6 (six) hours as needed (for pain).   0  . oxyCODONE-acetaminophen (PERCOCET/ROXICET) 5-325 MG per tablet Take 1 tablet by mouth every 6 (six) hours as needed for severe pain. 20 tablet 0  . sevelamer carbonate (RENVELA) 800 MG tablet Take 1,600-2,400 mg by mouth See admin instructions. Take 3 tablets (2400 mg) with meals and 2 tablets (1600 mg) with snacks    . SIMBRINZA 1-0.2 % SUSP Place 1 drop into both eyes 2 (two) times daily.  11  . predniSONE (DELTASONE) 20 MG tablet Take 2 tablets by mouth daily X 2 days; then 1 tablet by mouth daily X 2 days; then 1/2 tablet by mouth daily X 3 days and stop prednisone 9 tablet 0    Home: Home Living Family/patient expects to be discharged to:: Unsure Living Arrangements: Spouse/significant other  Functional History:   Functional Status:  Mobility:          ADL:    Cognition: Cognition Orientation Level: Oriented X4      Blood pressure 128/65, pulse 79, temperature 98.8 F (37.1 C), temperature source Oral, resp. rate 15, height 5\' 9"  (1.753 m), weight 59.5 kg (131 lb 2.8 oz), SpO2 100 %. Physical Exam  Nursing note and vitals reviewed. Constitutional: He is oriented to person, place, and time. He appears well-developed and well-nourished. Nasal cannula in place.    Pleasant elderly male in bed undergoing HD.   HENT:  Head: Normocephalic and atraumatic.  Eyes: Conjunctivae and EOM are normal. Pupils are equal, round, and reactive to light.  Neck: Normal range of motion. Neck supple.  Cardiovascular: Normal rate and regular rhythm.   Murmur heard. Respiratory: Effort normal and breath sounds normal. No respiratory distress. He has no wheezes.  GI: Soft. Bowel sounds are normal. He exhibits no distension.  Musculoskeletal: He exhibits tenderness. He exhibits no edema.  Right BKA incision well healed and intact.   Dry scab from healed ulcer on right thigh.   L-AKA with compressive dressing--clean and dry. Minimally tender to touch.   Neurological: He is alert and oriented to person, place, and time.  Speech clear.  Follows basic commands without difficulty.   B/l UE 5/5 Right hip flexion 5/5 Left hip flexion with pain inhibition  Skin: Skin is warm and dry. No rash noted. No erythema.  Left AKA with dressing c/d/i  Psychiatric: He has a normal mood and affect. His behavior is normal. Thought content normal.    Results for orders placed or performed during the hospital encounter of 10/10/15 (from the past 24 hour(s))  Basic metabolic panel     Status: Abnormal   Collection Time: 10/11/15  3:29 AM  Result Value Ref Range   Sodium 138 135 - 145 mmol/L   Potassium 4.9 3.5 - 5.1 mmol/L   Chloride 97 (L) 101 - 111 mmol/L   CO2 26 22 - 32 mmol/L   Glucose, Bld 79 65 - 99 mg/dL   BUN 53 (H) 6 - 20 mg/dL   Creatinine, Ser 9.88 (H) 0.61 - 1.24 mg/dL   Calcium 8.5 (L) 8.9 - 10.3 mg/dL   GFR calc non Af Amer 4 (L) >60 mL/min   GFR calc Af Amer 5 (L) >60 mL/min   Anion gap 15 5 - 15  CBC     Status: Abnormal   Collection Time: 10/11/15  3:29 AM  Result Value Ref Range   WBC 8.2 4.0 - 10.5 K/uL   RBC 3.60 (L) 4.22 -  5.81 MIL/uL   Hemoglobin 10.7 (L) 13.0 - 17.0 g/dL   HCT 35.5 (L) 39.0 - 52.0 %   MCV 98.6 78.0 - 100.0 fL   MCH 29.7 26.0 - 34.0  pg   MCHC 30.1 30.0 - 36.0 g/dL   RDW 18.6 (H) 11.5 - 15.5 %   Platelets 242 150 - 400 K/uL   No results found.  Assessment/Plan: Diagnosis: L-AKA due to PVD and h/o R-BKA Labs and images independently reviewed.  Records reviewed and summated above. PT/OT for mobility, ADL's, strengthening,and wheelchair training Clean amputation daily with soap and water Monitor incision site for signs of infection or impending skin breakdown. Staples to remain in place for 3-4 weeks Stump shrinker, for edema control  Scar mobilization massaging to prevent soft tissue adherence Stump protector during therapies Prevent flexion contractures by implementing the following:   Encourage prone lying for 20-30 mins per day BID to avoid hip flexion  Contractures if medically appropriate;  Avoid prolonged sitting Post surgical pain control with oral medication Phantom limb pain control with physical modalities including desensitization techniques (gentle self massage to the residual stump,hot packs if sensation iintact, [place]) and mirror therapy, TENS. If ineffective, consider pharmacological treatment for neuropathic pain (e.g gabapentin, pregabalin, amytriptalyine, duloxetine).   1. Does the need for close, 24 hr/day medical supervision in concert with the patient's rehab needs make it unreasonable for this patient to be served in a less intensive setting? Yes 2. Co-Morbidities requiring supervision/potential complications: NICM with combined chronic systolic/diastolic HF (Monitor in accordance with increased physical activity and avoid UE resistance excercises) PVD (cont meds), ESRD (cont recs per [facility]), hx of R-BKA 12/2014 with diffuse atherosclerosis LLE, HTN (monitor and provide prns in accordance with increased physical exertion and pain), anemia of chronic disease (transfuse if necessary to ensure appropriate perfusion for increased activity tolerance), post-op pain (Biofeedback training with therapies to help  reduce reliance on opiate pain medications, monitor pain control during therapies, and sedation at rest and titrate to maximum efficacy to ensure participation and gains in therapies) 3. Due to bladder management, safety, skin/wound care, disease management, medication administration, pain management and patient education, does the patient require 24 hr/day rehab nursing? Yes 4. Does the patient require coordinated care of a physician, rehab nurse, PT (1-2 hrs/day, 5 days/week) and OT (1-2 hrs/day, 85 days/week) to address physical and functional deficits in the context of the above medical diagnosis(es)? Yes Addressing deficits in the following areas: balance, endurance, locomotion, strength, transferring, bowel/bladder control, bathing, dressing, toileting and psychosocial support 5. Can the patient actively participate in an intensive therapy program of at least 3 hrs of therapy per day at least 5 days per week? Potentially 6. The potential for patient to make measurable gains while on inpatient rehab is excellent 7. Anticipated functional outcomes upon discharge from inpatient rehab are TBD  with PT, TBD with OT, N/A with SLP. 8. Estimated rehab length of stay to reach the above functional goals is: TBD. 9. Does the patient have adequate social supports and living environment to accommodate these discharge functional goals? Yes 10. Anticipated D/C setting: Home 11. Anticipated post D/C treatments: HH therapy and Home excercise program 12. Overall Rehab/Functional Prognosis: good  RECOMMENDATIONS: This patient's condition is appropriate for continued rehabilitative care in the following setting: CIR if pt willing and able to tolerate 3 hours therapy/day without IV pain meds. Patient has agreed to participate in recommended program. Potentially Note that insurance prior authorization may be required for reimbursement  for recommended care.  Comment: Rehab Admissions Coordinator to follow  up.  Delice Lesch, MD 10/11/2015

## 2015-10-10 NOTE — H&P (View-Only) (Signed)
[facility] [place]  Patient name: Randy [name] MRN: [id number] DOB: 08/08/1932 Sex: male  REASON FOR VISIT: Left foot pain  HPI: Randy [name] is a 82 y.o. male, who I last saw on 03/24/2015 with left foot pain. He had undergone a previous right below-the-knee amputation on 01/05/2015. Of note, he has undergone a previous CT angiogram which was done on 12/31/2014. This showed diffuse atherosclerotic disease involving the aorta, iliac arteries, and also diffuse infrainguinal arterial occlusive disease. His only runoff bilaterally was the peroneal arteries. At the time of his last visit I did not think he was a candidate for revascularization. He is 82 years old, has end-stage renal disease on dialysis, coronary artery disease, congestive heart failure, and is on home O2. I explained that I would recommend a primary amputation if the pain were not tolerable.  He has been having persistent pain in his left foot which is no longer bearable and he would like to proceed with amputation.  Past Medical History  Diagnosis Date  . Hypercholesterolemia   . Gout   . Nonischemic cardiomyopathy (HCC)     LVEF 35-40%  . Anemia of chronic disease   . Essential hypertension   . Arthritis   . Peripheral vascular disease ([place])     Status post right below knee amputation for a nonhealing wound of the right foot 12/2014  . History of pneumonia 2014  . History of myopericarditis 2015  . ESRD on hemodialysis [facility])     M/W/F in [place] - Dr. Florene Glen  . CAD (coronary artery disease)     Moderate multivessel disease 07/2015 - managed medically    Family History  Problem Relation Age of Onset  . Arthritis    . Cancer    . Kidney disease    . Anesthesia problems Neg Hx   . Hypotension Neg Hx   . Malignant hyperthermia Neg Hx   . Pseudochol deficiency Neg Hx   . Cancer Sister   . Colon cancer Brother   . Colon cancer Brother     SOCIAL HISTORY: Social History   Social  History  . Marital Status: Married    Spouse Name: N/A  . Number of Children: N/A  . Years of Education: 10   Occupational History  .     Social History Main Topics  . Smoking status: Former Smoker -- 1.00 packs/day for 30 years    Types: Cigarettes    Quit date: 08/19/1998  . Smokeless tobacco: Former Systems developer    Types: Chew    Quit date: 12/31/1993  . Alcohol Use: No  . Drug Use: No  . Sexual Activity: No   Other Topics Concern  . Not on file   Social History Narrative    No Known Allergies  Current Outpatient Prescriptions  Medication Sig Dispense Refill  . amLODipine (NORVASC) 10 MG tablet Take 1 tablet (10 mg total) by mouth daily. 30 tablet 3  . aspirin 81 MG tablet Take 81 mg by mouth daily.    Marland Kitchen atorvastatin (LIPITOR) 40 MG tablet Take 1 tablet (40 mg total) by mouth daily at 6 PM. 30 tablet 1  . calcitRIOL (ROCALTROL) 0.5 MCG capsule Take 1 capsule (0.5 mcg total) by mouth daily. 30 capsule 3  . carvedilol (COREG) 25 MG tablet Take 1 tablet (25 mg total) by mouth 2 (two) times daily with a meal. 60 tablet 1  . cloNIDine (CATAPRES) 0.2 MG tablet Take 0.2 mg by  mouth 2 (two) times daily as needed (blood pressure >130/27).   10  . doxycycline (VIBRA-TABS) 100 MG tablet Take 1 tablet (100 mg total) by mouth every 12 (twelve) hours. 14 tablet 0  . feeding supplement, RESOURCE BREEZE, (RESOURCE BREEZE) LIQD Take 1 Container by mouth 3 (three) times daily between meals. 90 Container 5  . gabapentin (NEURONTIN) 100 MG capsule Take 1 capsule (100 mg total) by mouth 3 (three) times daily. (Patient taking differently: Take 100 mg by mouth at bedtime. ) 60 capsule 0  . hydrALAZINE (APRESOLINE) 25 MG tablet Take 1 tablet (25 mg total) by mouth 3 (three) times daily. (Patient taking differently: Take 25 mg by mouth 3 (three) times daily as needed (High blood pressure). ) 90 tablet 3  . isosorbide mononitrate (IMDUR) 30 MG 24 hr tablet Take 0.5 tablets (15 mg total) by mouth at bedtime.  45 tablet 3  . lisinopril (PRINIVIL,ZESTRIL) 10 MG tablet Take 1 tablet by mouth daily.  2  . LUMIGAN 0.01 % SOLN Place 1 drop into both eyes at bedtime.  11  . multivitamin (RENA-VIT) TABS tablet Take 1 tablet by mouth daily.    . Oxycodone HCl 10 MG TABS Take 10 mg by mouth every 6 (six) hours as needed (for pain).   0  . oxyCODONE-acetaminophen (PERCOCET/ROXICET) 5-325 MG per tablet Take 1 tablet by mouth every 6 (six) hours as needed for severe pain. 20 tablet 0  . predniSONE (DELTASONE) 20 MG tablet Take 2 tablets by mouth daily X 2 days; then 1 tablet by mouth daily X 2 days; then 1/2 tablet by mouth daily X 3 days and stop prednisone 9 tablet 0  . sevelamer carbonate (RENVELA) 800 MG tablet Take 1,600-2,400 mg by mouth See admin instructions. Take 3 tablets (2400 mg) with meals and 2 tablets (1600 mg) with snacks    . SIMBRINZA 1-0.2 % SUSP Place 1 drop into both eyes 2 (two) times daily.  11   No current facility-administered medications for this visit.    REVIEW OF SYSTEMS:  [X]  denotes positive finding, [ ]  denotes negative finding Cardiac  Comments:  Chest pain or chest pressure:    Shortness of breath upon exertion:    Short of breath when lying flat:    Irregular heart rhythm:        Vascular    Pain in calf, thigh, or hip brought on by ambulation:    Pain in feet at night that wakes you up from your sleep:  X Left foot  Blood clot in your veins:    Leg swelling:  X Left foot      Pulmonary    Oxygen at home:    Productive cough:     Wheezing:         Neurologic    Sudden weakness in arms or legs:     Sudden numbness in arms or legs:     Sudden onset of difficulty speaking or slurred speech:    Temporary loss of vision in one eye:     Problems with dizziness:         Gastrointestinal    Blood in stool:     Vomited blood:         Genitourinary    Burning when urinating:     Blood in urine:        Psychiatric    Major depression:         Hematologic      Bleeding  problems:    Problems with blood clotting too easily:        Skin    Rashes or ulcers:        Constitutional    Fever or chills:      PHYSICAL EXAM: Filed Vitals:   10/04/15 1134 10/04/15 1137  BP: 154/75 148/72  Pulse: 77   Temp: 97.5 F (36.4 C)   TempSrc: Oral   Height: 5\' 9"  (1.753 m)   Weight: 120 lb (54.432 kg)   SpO2: 98%     GENERAL: The patient is a well-nourished male, in no acute distress. The vital signs are documented above. CARDIAC: There is a regular rate and rhythm. He has a systolic ejection murmur. VASCULAR: I cannot palpate femoral pulses. He has known multilevel arterial occlusive disease. PULMONARY: There is good air exchange bilaterally without wheezing or rales. ABDOMEN: Soft and non-tender with normal pitched bowel sounds.  MUSCULOSKELETAL: He has a right below-the-knee amputation. NEUROLOGIC: No focal weakness or paresthesias are detected. SKIN: he has gangrenous changes to the left great toe. PSYCHIATRIC: The patient has a normal affect.  DATA:  I have reviewed his lower extremity arterial Doppler study from 06/28/2015 which shows monophasic Doppler signals in the dorsalis pedis, posterior tibial and peroneal arteries. The vessels are calcified and not compressible.  MEDICAL ISSUES: Given his multilevel disease and the fact that he has an amputation on the right side, I think the safest approach would be a left above-the-knee amputation. We have discussed the procedure and potential complications in the office in detail today and he is agreeable to proceed. He dialyzes on Monday Wednesdays and Fridays and will arrange for his surgery on a Tuesday, one 10/10/15.   [name] [facility] [place]: [phone]

## 2015-10-10 NOTE — Progress Notes (Signed)
Utilization review completed.  

## 2015-10-10 NOTE — Anesthesia Postprocedure Evaluation (Signed)
Anesthesia Post Note  Patient: Randy [name]  Procedure(s) Performed: Procedure(s) (LRB): AMPUTATION ABOVE KNEE LEFT (Left)  Patient location during evaluation: PACU Anesthesia Type: General Level of consciousness: awake and alert Pain management: pain level controlled Vital Signs Assessment: post-procedure vital signs reviewed and stable Respiratory status: spontaneous breathing, nonlabored ventilation, respiratory function stable and patient connected to nasal cannula oxygen Cardiovascular status: blood pressure returned to baseline and stable Postop Assessment: no signs of nausea or vomiting Anesthetic complications: no    Last Vitals:  Filed Vitals:   10/10/15 1259 10/10/15 1500  BP: 183/81 176/80  Pulse: 80 81  Temp: 36.8 C 36.7 C  Resp: 18 18    Last Pain:  Filed Vitals:   10/10/15 1511  PainSc: 5                  Jennaya Pogue DAVID

## 2015-10-10 NOTE — Progress Notes (Signed)
ESRD HD pt, zinacef 1.5 gm given preop 0740, 1 x dose ordered for 1/18 am is appropriate thanks Eudelia Bunch, Pharm.D. QP:[id number] 10/10/2015 11:35 AM

## 2015-10-10 NOTE — Consult Note (Signed)
[facility] Renal Consultation Note    Indication for Consultation:  Management of ESRD/hemodialysis; anemia, hypertension/volume and secondary hyperparathyroidism PCP:  HPI: Randy [name] is a 82 y.o. male with ESRD secondary to HTN and PCKD who dialyzes MWF at [place] who has been followed previously at the [place] for his LLE foot wound.  They told him in December 2016. (per outpt HD notes) that he had the blockage could not be corrected and he would likely need a AKA and was to call them when ready. He wanted the surgery done locally by someone who knew him.  He was then seen by Dr. Scot Dock 10/04/15 who had seen him last April and at that time didn't consider him a candidate for revascularization. Due to severe persistent left foot pain, the patient was ready to proceed with amputation which occurred earlier today.  He had a prior right BKA.  He denies problems with HD treatments. He has been getting to or below his EDW with variable weight gains.  His post HD BPs range from 130 - 190s.  Currently he has pain in left AKA, no SOB, CP, N, V, fever or chills.  He is drinking liquids without problems. Appetite is ok. He makes little urine and has chronic constipation. He is due for dialysis tomorrow.  Past Medical History  Diagnosis Date  . Hypercholesterolemia   . Gout   . Nonischemic cardiomyopathy (HCC)     LVEF 35-40%  . Anemia of chronic disease   . Essential hypertension   . Arthritis   . Peripheral vascular disease ([place])     Status post right below knee amputation for a nonhealing wound of the right foot 12/2014  . History of pneumonia 2014  . CAD (coronary artery disease)     Moderate multivessel disease 07/2015 - managed medically  . History of myopericarditis 2015    07/2015  . Stroke [facility])     "they said I did"  "I did not know anything about it"  . ESRD on hemodialysis [facility])     M/W/F in [place] - Dr. Florene Glen  . Constipation   . History of blood  transfusion   . Pneumonia   . Acute myopericarditis 2015   Past Surgical History  Procedure Laterality Date  . Knee arthroscopy Right 2007  . Back surgery    . Hemorrhoid surgery  1970's  . Ganglion cyst excision  01/03/2012    Procedure: REMOVAL GANGLION OF WRIST;  Surgeon: Scherry Ran, MD;  Location: [facility];  Service: General;  Laterality: Right;  . Colonoscopy    . Av fistula placement  08/24/2012    Procedure: ARTERIOVENOUS (AV) FISTULA CREATION;  Surgeon: Rosetta Posner, MD;  Location: [place];  Service: Vascular;  Laterality: Left;  . Insertion of dialysis catheter Right      neck  . Insertion of dialysis catheter  10/19/2012    Procedure: INSERTION OF DIALYSIS CATHETER;  Surgeon: Rosetta Posner, MD;  Location: [place];  Service: Vascular;  Laterality: N/A;  REMOVE TEMPORARY CATH  . Cholecystectomy    . Lumbar disc surgery  2004  . Left heart catheterization with coronary angiogram N/A 08/17/2014    Procedure: LEFT HEART CATHETERIZATION WITH CORONARY ANGIOGRAM;  Surgeon: Larey Dresser, MD;  Location: [facility] CATH LAB;  Service: Cardiovascular;  Laterality: N/A;  . Colonoscopy Left 09/26/2014    Procedure: COLONOSCOPY;  Surgeon: Arta Silence, MD;  Location: [facility] ENDOSCOPY;  Service: Endoscopy;  Laterality: Left;  .  Amputation Right 01/05/2015    Procedure: AMPUTATION BELOW KNEE;  Surgeon: Angelia Mould, MD;  Location: [place];  Service: Vascular;  Laterality: Right;  . Capd removal N/A 03/13/2015    Procedure: CONTINUOUS AMBULATORY PERITONEAL DIALYSIS  (CAPD) CATHETER REMOVAL;  Surgeon: Coralie Keens, MD;  Location: [place];  Service: General;  Laterality: N/A;  . Cardiac catheterization    . Above knee leg amputation Left 10/10/2015   Family History  Problem Relation Age of Onset  . Arthritis    . Cancer    . Kidney disease    . Anesthesia problems Neg Hx   . Hypotension Neg Hx   . Malignant hyperthermia Neg Hx   . Pseudochol deficiency Neg Hx   . Cancer Sister   . Colon  cancer Brother   . Colon cancer Brother    Social History:  reports that he quit smoking about 17 years ago. His smoking use included Cigarettes. He has a 30 pack-year smoking history. He quit smokeless tobacco use about 21 years ago. His smokeless tobacco use included Chew. He reports that he does not drink alcohol or use illicit drugs. No Known Allergies Prior to Admission medications   Medication Sig Start Date End Date Taking? Authorizing Provider  amLODipine (NORVASC) 10 MG tablet Take 1 tablet (10 mg total) by mouth daily. 06/23/14  Yes Lucia Gaskins, MD  aspirin 81 MG tablet Take 81 mg by mouth daily.   Yes Historical Provider, MD  atorvastatin (LIPITOR) 40 MG tablet Take 1 tablet (40 mg total) by mouth daily at 6 PM. 01/13/15  Yes Barton Dubois, MD  calcitRIOL (ROCALTROL) 0.5 MCG capsule Take 1 capsule (0.5 mcg total) by mouth daily. 06/23/14  Yes Lucia Gaskins, MD  carvedilol (COREG) 25 MG tablet Take 1 tablet (25 mg total) by mouth 2 (two) times daily with a meal. 01/13/15  Yes Barton Dubois, MD  cloNIDine (CATAPRES) 0.2 MG tablet Take 0.2 mg by mouth 2 (two) times daily as needed (blood pressure >130/27).  12/27/14  Yes Historical Provider, MD  doxycycline (VIBRA-TABS) 100 MG tablet Take 1 tablet (100 mg total) by mouth every 12 (twelve) hours. 01/13/15  Yes Barton Dubois, MD  feeding supplement, RESOURCE BREEZE, (RESOURCE BREEZE) LIQD Take 1 Container by mouth 3 (three) times daily between meals. 09/30/14  Yes Annita Brod, MD  gabapentin (NEURONTIN) 100 MG capsule Take 1 capsule (100 mg total) by mouth 3 (three) times daily. Patient taking differently: Take 100 mg by mouth at bedtime.  01/22/15  Yes Leonard Schwartz, MD  hydrALAZINE (APRESOLINE) 25 MG tablet Take 1 tablet (25 mg total) by mouth 3 (three) times daily. Patient taking differently: Take 25 mg by mouth 3 (three) times daily as needed (High blood pressure).  08/23/14  Yes Rhonda G Barrett, PA-C  isosorbide mononitrate (IMDUR)  30 MG 24 hr tablet Take 0.5 tablets (15 mg total) by mouth at bedtime. 09/20/15  Yes Arnoldo Lenis, MD  lisinopril (PRINIVIL,ZESTRIL) 10 MG tablet Take 1 tablet by mouth daily. 01/24/15  Yes Historical Provider, MD  LUMIGAN 0.01 % SOLN Place 1 drop into both eyes at bedtime. 09/18/15  Yes Historical Provider, MD  multivitamin (RENA-VIT) TABS tablet Take 1 tablet by mouth daily.   Yes Historical Provider, MD  Oxycodone HCl 10 MG TABS Take 10 mg by mouth every 6 (six) hours as needed (for pain).  09/19/15  Yes Historical Provider, MD  oxyCODONE-acetaminophen (PERCOCET/ROXICET) 5-325 MG per tablet Take 1 tablet by mouth every  6 (six) hours as needed for severe pain. 03/14/15  Yes Coralie Keens, MD  sevelamer carbonate (RENVELA) 800 MG tablet Take 1,600-2,400 mg by mouth See admin instructions. Take 3 tablets (2400 mg) with meals and 2 tablets (1600 mg) with snacks   Yes Historical Provider, MD  SIMBRINZA 1-0.2 % SUSP Place 1 drop into both eyes 2 (two) times daily. 09/18/15  Yes Historical Provider, MD  predniSONE (DELTASONE) 20 MG tablet Take 2 tablets by mouth daily X 2 days; then 1 tablet by mouth daily X 2 days; then 1/2 tablet by mouth daily X 3 days and stop prednisone 01/13/15   Barton Dubois, MD   Current Facility-Administered Medications  Medication Dose Route Frequency Provider Last Rate Last Dose  . acetaminophen (TYLENOL) tablet 325-650 mg  325-650 mg Oral Q4H PRN Angelia Mould, MD       Or  . acetaminophen (TYLENOL) suppository 325-650 mg  325-650 mg Rectal Q4H PRN Angelia Mould, MD      . alum & mag hydroxide-simeth (MAALOX/MYLANTA) 200-200-20 MG/5ML suspension 15-30 mL  15-30 mL Oral Q2H PRN Angelia Mould, MD      . amLODipine (NORVASC) tablet 10 mg  10 mg Oral Daily Angelia Mould, MD   10 mg at 10/10/15 1222  . aspirin chewable tablet 81 mg  81 mg Oral Daily Angelia Mould, MD   81 mg at 10/10/15 1222  . atorvastatin (LIPITOR) tablet 40 mg  40  mg Oral q1800 Angelia Mould, MD      . calcitRIOL (ROCALTROL) capsule 0.5 mcg  0.5 mcg Oral Daily Angelia Mould, MD   0.5 mcg at 10/10/15 1221  . carvedilol (COREG) tablet 25 mg  25 mg Oral BID WC Angelia Mould, MD      . Derrill Memo ON 10/11/2015] cefUROXime (ZINACEF) 1.5 g in dextrose 5 % 50 mL IVPB  1.5 g Intravenous Q12H Rachel L Rumbarger, RPH      . cloNIDine (CATAPRES) tablet 0.2 mg  0.2 mg Oral Once Lillia Abed, MD      . cloNIDine (CATAPRES) tablet 0.2 mg  0.2 mg Oral BID PRN Angelia Mould, MD   0.2 mg at 10/10/15 1512  . dextrose 5 % and 0.45 % NaCl with KCl 20 mEq/L infusion   Intravenous Continuous Angelia Mould, MD 50 mL/hr at 10/10/15 1444    . [START ON 10/11/2015] docusate sodium (COLACE) capsule 100 mg  100 mg Oral Daily Angelia Mould, MD      . doxycycline (VIBRA-TABS) tablet 100 mg  100 mg Oral Q12H Angelia Mould, MD   100 mg at 10/10/15 1444  . [START ON 10/11/2015] enoxaparin (LOVENOX) injection 30 mg  30 mg Subcutaneous Q24H Angelia Mould, MD      . feeding supplement (BOOST / RESOURCE BREEZE) liquid 1 Container  1 Container Oral TID BM Angelia Mould, MD   1 Container at 10/10/15 1448  . gabapentin (NEURONTIN) capsule 100 mg  100 mg Oral QHS Angelia Mould, MD      . guaiFENesin-dextromethorphan [facility] DM) 100-10 MG/5ML syrup 15 mL  15 mL Oral Q4H PRN Angelia Mould, MD      . hydrALAZINE (APRESOLINE) injection 5 mg  5 mg Intravenous Q20 Min PRN Angelia Mould, MD      . hydrALAZINE (APRESOLINE) tablet 25 mg  25 mg Oral TID PRN Angelia Mould, MD      . HYDROmorphone (DILAUDID) injection  0.5-1 mg  0.5-1 mg Intravenous Q2H PRN Angelia Mould, MD   1 mg at 10/10/15 1227  . isosorbide mononitrate (IMDUR) 24 hr tablet 15 mg  15 mg Oral QHS Angelia Mould, MD      . labetalol (NORMODYNE,TRANDATE) injection 10 mg  10 mg Intravenous Q10 min PRN Angelia Mould, MD       . latanoprost (XALATAN) 0.005 % ophthalmic solution 1 drop  1 drop Both Eyes QHS Angelia Mould, MD      . lisinopril (PRINIVIL,ZESTRIL) tablet 10 mg  10 mg Oral Daily Angelia Mould, MD   10 mg at 10/10/15 1221  . magnesium sulfate IVPB 2 g 50 mL  2 g Intravenous Once PRN Angelia Mould, MD      . metoprolol (LOPRESSOR) injection 2-5 mg  2-5 mg Intravenous Q2H PRN Angelia Mould, MD      . multivitamin (RENA-VIT) tablet 1 tablet  1 tablet Oral Daily Angelia Mould, MD   1 tablet at 10/10/15 1221  . ondansetron (ZOFRAN) injection 4 mg  4 mg Intravenous Q6H PRN Angelia Mould, MD      . oxyCODONE (Oxy IR/ROXICODONE) immediate release tablet 10 mg  10 mg Oral Q6H PRN Angelia Mould, MD      . oxyCODONE-acetaminophen (PERCOCET/ROXICET) 5-325 MG per tablet 1-2 tablet  1-2 tablet Oral Q4H PRN Angelia Mould, MD      . pantoprazole (PROTONIX) EC tablet 40 mg  40 mg Oral Daily Angelia Mould, MD   40 mg at 10/10/15 1221  . phenol (CHLORASEPTIC) mouth spray 1 spray  1 spray Mouth/Throat PRN Angelia Mould, MD      . potassium chloride SA (K-DUR,KLOR-CON) CR tablet 20-40 mEq  20-40 mEq Oral Once PRN Angelia Mould, MD      . sevelamer carbonate (RENVELA) tablet 1,600 mg  1,600 mg Oral PRN Angelia Mould, MD      . sevelamer carbonate (RENVELA) tablet 2,400 mg  2,400 mg Oral TID WC Angelia Mould, MD       Labs: Basic Metabolic Panel:  Recent Labs Lab 10/10/15 0640  NA 140  K 4.5  CL 98*  CO2 28  GLUCOSE 101*  BUN 40*  CREATININE 8.61*  CALCIUM 9.6   Liver Function Tests:  Recent Labs Lab 10/10/15 0640  AST 18  ALT 9*  ALKPHOS 56  BILITOT 0.6  PROT 6.6  ALBUMIN 2.8*   CBC:  Recent Labs Lab 10/10/15 0640  WBC 6.6  HGB 11.3*  HCT 38.2*  MCV 99.5  PLT 231   ROS: As per HPI otherwise negative.  Physical Exam: Filed Vitals:   10/10/15 1030 10/10/15 1100 10/10/15 1259 10/10/15 1500   BP: 161/73 174/81 183/81 176/80  Pulse: 63 73 80 81  Temp: 97.7 F (36.5 C) 98.5 F (36.9 C) 98.3 F (36.8 C) 98.1 F (36.7 C)  TempSrc:  Oral Oral Oral  Resp: 20 19 18 18   Height:      Weight:      SpO2: 100% 100% 99% 100%     General:  Elderly AAM looks younger than 70 doing well post op, some pain, but NAD Head: Normocephalic, atraumatic, sclera non-icteric, mucus membranes are moist Neck: Supple. JVD not elevated. Lungs:  No rales, occ soft exp wheeze, Breathing is unlabored. Heart: RRR with S1 S2. 2/6 murmur Abdomen: Soft, non-tender, non-distended with normoactive bowel sounds. . Lower extremities: right BKA well healed  no edema; left AKA ace wrapped with blood on distal portion Neuro: Alert and oriented X 3.  Psych:  Responds to questions appropriately with a normal affect. Dialysis Access: left lower AVF + bruit  Dialysis Orders: [facility] MWF  3.5 hours 180 400/800 2 K 2.25 Ca left lower AVF heparin 6600 Mircera 100 q 2 weeks last 1/11 (down from 150 before) calcitriol 0.5  Recent labs:  Hgb 10.3 1/11 28% sat Ca P ok iPTH 560  Assessment/Plan: 1.  s/p left AKA for nonhealing wound and progressive ischemia of left leg 1/17 - per Dr. Scot Dock; for rehab consult 2.  ESRD -  MWF - HD  Wed - hold heparin K 4.5 pre op  3.  Hypertension/volume  - titrate EDW post amputation; on multiple meds; hydralazine, coreg, norvasc and clonidne; D/C IVF 4.  Anemia  - 11.3 - ESA due for redose 1/26; expect Hgb to drop some post op; last tsat was 28% with ferritin 1300 in October not on IV Fe;  Expect ferritin to improve post amputation ; add IV Fe 125 x 4 5.  Metabolic bone disease -  Continue calcitriol Ca 9.6 /renvela 6.  Nutrition - alb 2.8 , CL - advance as able, 7. ID - not clear why he is on doxycycline - he does not know either  Myriam Jacobson, PA-C [place] [phone] 10/10/2015, 4:29 PM    Renal Attending: PT with PVD admitted for second BKA for ischemic  limb.  Will support as noted above with RRT and medical support. Farheen Pfahler C

## 2015-10-10 NOTE — Anesthesia Preprocedure Evaluation (Addendum)
Anesthesia Evaluation  Patient identified by MRN, date of birth, ID band Patient awake    Reviewed: Allergy & Precautions, NPO status , Patient's Chart, lab work & pertinent test results  Airway Mallampati: II  TM Distance: >3 FB Neck ROM: Full    Dental  (+) Edentulous Upper, Edentulous Lower, Dental Advisory Given   Pulmonary COPD, former smoker,    Pulmonary exam normal        Cardiovascular hypertension, Pt. on medications + CAD, + Past MI and +CHF  Normal cardiovascular exam+ Valvular Problems/Murmurs AS   08/04/2015 ECHO Study Conclusions  - Left ventricle: The cavity size was normal. Wall thickness was increased increased in a pattern of mild to moderate LVH. Incidentally noted transverse false tendon in LV. Systolic function was moderately to severely reduced. The estimated ejection fraction was in the range of 30% to 35%. Diffuse hypokinesis. There is severe hypokinesis of the basal-midinferolateral and inferior myocardium. Doppler parameters are consistent with restrictive physiology, indicative of decreased left ventricular diastolic compliance and/or increased left atrial pressure. - Aortic valve: Moderately calcified annulus. Trileaflet; mildly calcified leaflets. Cusp separation was reduced. There was moderate stenosis. There was trivial regurgitation. Mean gradient (S): 16 mm Hg. VTI ratio of LVOT to aortic valve: 0.4. Valve area (VTI): 1.24 cm^2. Valve area (Vmax): 1.21 cm^2. - Mitral valve: Calcified annulus. There was trivial regurgitation. - Left atrium: The atrium was moderately dilated. - Right atrium: The atrium was moderately to severely dilated. Central venous pressure (est): 3 mm Hg. - Atrial septum: No defect or patent foramen ovale was identified. - Tricuspid valve: There was mild regurgitation. - Pulmonary arteries: PA peak pressure: 38 mm Hg (S). - Pericardium,  extracardiac: There was no pericardial effusion.  Impressions:  - Mild to moderate LVH with LVEF approximately 30%, diffuse hypokinesis, most severe in the mid to basal inferior and inferolateral wall. Diastolic filling pattern consistent with restrictive physiology. Moderate left atrial enlargement. Moderate calcific aortic stenosis with trivial aortic regurgitation. Mild tricuspid regurgitation with PASP 38 mmHg. Moderate to severe right atrial enlargement. Compared to the previous study from December 2015, there has been further reduction in LVEF, and progression in degree of aortic stenosis.     Neuro/Psych CVA    GI/Hepatic   Endo/Other    Renal/GU ESRF and DialysisRenal disease     Musculoskeletal   Abdominal   Peds  Hematology   Anesthesia Other Findings   Reproductive/Obstetrics                          Anesthesia Physical Anesthesia Plan  ASA: III  Anesthesia Plan: General   Post-op Pain Management:    Induction: Intravenous  Airway Management Planned: LMA  Additional Equipment:   Intra-op Plan:   Post-operative Plan: Extubation in OR  Informed Consent: I have reviewed the patients History and Physical, chart, labs and discussed the procedure including the risks, benefits and alternatives for the proposed anesthesia with the patient or authorized representative who has indicated his/her understanding and acceptance.     Plan Discussed with: CRNA and Surgeon  Anesthesia Plan Comments:         Anesthesia Quick Evaluation

## 2015-10-10 NOTE — Plan of Care (Signed)
Problem: Activity: Goal: Ability to tolerate increased activity will improve Outcome: Not Progressing Pt deferred exercise/ activity to tomorrow.  Problem: Bowel/Gastric: Goal: Gastrointestinal status for postoperative course will improve Outcome: Progressing Pt has tolerated eating clear liquid diet for lunch and supper today. Diet has been upgraded to full liquid for tomorrow.    Pt's pain from surgical incision site to left stump has been managed with dilaudid this shift. Left stump noted with moderate amount of blood on dressing. MD notified. Orders to change dressing. Md will evaluate bleeding from site.

## 2015-10-11 ENCOUNTER — Encounter (HOSPITAL_COMMUNITY): Payer: Self-pay | Admitting: Vascular Surgery

## 2015-10-11 DIAGNOSIS — I5042 Chronic combined systolic (congestive) and diastolic (congestive) heart failure: Secondary | ICD-10-CM

## 2015-10-11 DIAGNOSIS — D638 Anemia in other chronic diseases classified elsewhere: Secondary | ICD-10-CM

## 2015-10-11 DIAGNOSIS — Z89612 Acquired absence of left leg above knee: Secondary | ICD-10-CM | POA: Insufficient documentation

## 2015-10-11 DIAGNOSIS — R269 Unspecified abnormalities of gait and mobility: Secondary | ICD-10-CM | POA: Insufficient documentation

## 2015-10-11 DIAGNOSIS — G8918 Other acute postprocedural pain: Secondary | ICD-10-CM | POA: Insufficient documentation

## 2015-10-11 DIAGNOSIS — I10 Essential (primary) hypertension: Secondary | ICD-10-CM | POA: Insufficient documentation

## 2015-10-11 DIAGNOSIS — N186 End stage renal disease: Secondary | ICD-10-CM

## 2015-10-11 DIAGNOSIS — Z992 Dependence on renal dialysis: Secondary | ICD-10-CM

## 2015-10-11 LAB — CBC
HCT: 35.5 % — ABNORMAL LOW (ref 39.0–52.0)
HCT: 39.2 % (ref 39.0–52.0)
Hemoglobin: 10.7 g/dL — ABNORMAL LOW (ref 13.0–17.0)
Hemoglobin: 11.7 g/dL — ABNORMAL LOW (ref 13.0–17.0)
MCH: 29.7 pg (ref 26.0–34.0)
MCH: 29.3 pg (ref 26.0–34.0)
MCHC: 30.1 g/dL (ref 30.0–36.0)
MCHC: 29.8 g/dL — ABNORMAL LOW (ref 30.0–36.0)
MCV: 98.6 fL (ref 78.0–100.0)
MCV: 98 fL (ref 78.0–100.0)
PLATELETS: 242 10*3/uL (ref 150–400)
Platelets: 219 10*3/uL (ref 150–400)
RBC: 3.6 MIL/uL — ABNORMAL LOW (ref 4.22–5.81)
RBC: 4 MIL/uL — ABNORMAL LOW (ref 4.22–5.81)
RDW: 18.6 % — AB (ref 11.5–15.5)
RDW: 18.7 % — ABNORMAL HIGH (ref 11.5–15.5)
WBC: 8.2 10*3/uL (ref 4.0–10.5)
WBC: 7.8 10*3/uL (ref 4.0–10.5)

## 2015-10-11 LAB — GLUCOSE, CAPILLARY
GLUCOSE-CAPILLARY: 105 mg/dL — AB (ref 65–99)
Glucose-Capillary: 183 mg/dL — ABNORMAL HIGH (ref 65–99)

## 2015-10-11 LAB — BASIC METABOLIC PANEL
Anion gap: 15 (ref 5–15)
BUN: 53 mg/dL — AB (ref 6–20)
CALCIUM: 8.5 mg/dL — AB (ref 8.9–10.3)
CO2: 26 mmol/L (ref 22–32)
CREATININE: 9.88 mg/dL — AB (ref 0.61–1.24)
Chloride: 97 mmol/L — ABNORMAL LOW (ref 101–111)
GFR calc Af Amer: 5 mL/min — ABNORMAL LOW (ref >60)
GFR, EST NON AFRICAN AMERICAN: 4 mL/min — AB (ref >60)
Glucose, Bld: 79 mg/dL (ref 65–99)
Potassium: 4.9 mmol/L (ref 3.5–5.1)
SODIUM: 138 mmol/L (ref 135–145)

## 2015-10-11 LAB — RENAL FUNCTION PANEL
Albumin: 2.5 g/dL — ABNORMAL LOW (ref 3.5–5.0)
Anion gap: 11 (ref 5–15)
BUN: 17 mg/dL (ref 6–20)
CO2: 29 mmol/L (ref 22–32)
Calcium: 9 mg/dL (ref 8.9–10.3)
Chloride: 97 mmol/L — ABNORMAL LOW (ref 101–111)
Creatinine, Ser: 5.07 mg/dL — ABNORMAL HIGH (ref 0.61–1.24)
GFR calc Af Amer: 11 mL/min — ABNORMAL LOW (ref >60)
GFR calc non Af Amer: 10 mL/min — ABNORMAL LOW (ref >60)
Glucose, Bld: 130 mg/dL — ABNORMAL HIGH (ref 65–99)
Phosphorus: 4.2 mg/dL (ref 2.5–4.6)
Potassium: 3.7 mmol/L (ref 3.5–5.1)
Sodium: 137 mmol/L (ref 135–145)

## 2015-10-11 LAB — PHOSPHORUS: Phosphorus: 3.6 mg/dL (ref 2.5–4.6)

## 2015-10-11 LAB — PREALBUMIN: Prealbumin: 17.4 mg/dL — ABNORMAL LOW (ref 18–38)

## 2015-10-11 MED ORDER — OXYCODONE HCL 5 MG PO TABS
ORAL_TABLET | ORAL | Status: AC
  Filled 2015-10-11: qty 2

## 2015-10-11 MED ORDER — SODIUM CHLORIDE 0.9 % IV SOLN: 100.0000 mL | INTRAVENOUS | Status: DC | PRN

## 2015-10-11 MED ORDER — OXYCODONE-ACETAMINOPHEN 5-325 MG PO TABS
ORAL_TABLET | ORAL | Status: AC
  Filled 2015-10-11: qty 1

## 2015-10-11 MED ORDER — HEPARIN SODIUM (PORCINE) 1000 UNIT/ML DIALYSIS: 1000.0000 [IU] | INTRAMUSCULAR | Status: DC | PRN

## 2015-10-11 MED ORDER — LIDOCAINE HCL (PF) 1 % IJ SOLN: 5.0000 mL | INTRAMUSCULAR | Status: DC | PRN

## 2015-10-11 MED ORDER — SODIUM CHLORIDE 0.9 % IV SOLN
100.0000 mL | INTRAVENOUS | Status: DC | PRN
Start: 2015-10-11 — End: 2015-10-12

## 2015-10-11 MED ORDER — ALTEPLASE 2 MG IJ SOLR
2.0000 mg | Freq: Once | INTRAMUSCULAR | Status: DC | PRN
  Filled 2015-10-11: qty 2

## 2015-10-11 MED ORDER — LIDOCAINE-PRILOCAINE 2.5-2.5 % EX CREA
1.0000 "application " | TOPICAL_CREAM | CUTANEOUS | Status: DC | PRN
  Filled 2015-10-11: qty 5

## 2015-10-11 MED ORDER — PENTAFLUOROPROP-TETRAFLUOROETH EX AERO: 1.0000 "application " | INHALATION_SPRAY | CUTANEOUS | Status: DC | PRN

## 2015-10-11 NOTE — Progress Notes (Signed)
Randy [name] will follow up on Thursday to assess rehab venue options. I will begin [facility] insurance authorization for a possible admit pending that assessment. [name] can be reached at [phone] tomorrow. [id number]

## 2015-10-11 NOTE — Evaluation (Signed)
Occupational Therapy Evaluation Patient Details Name: Randy [name] MRN: [id number] DOB: 02-23-1932 Today's Date: 10/11/2015    History of Present Illness pt is an 82 y/o male with h/o NICM, HTN, PVD, CAD, stroke, ESRD and old R BKA s/p L AKA.   Clinical Impression   Pt was primarily using a w/c for mobility prior to admission and performing pivot transfers independently on his L LE.  Pt was able to get down in his jetted tub with assistance and use of grab bar and perform dressing using sit>stand method. Pt presents with generalized weakness and post operative pain.  He demonstrates impaired sitting balance for seated ADL. Pt is highly motivated to return home, but does not want to be a burden to his wife.  Recommending inpatient rehab prior to return home.  Will follow acutely.    Follow Up Recommendations  CIR    Equipment Recommendations    to be determined   Recommendations for Other Services       Precautions / Restrictions Precautions Precautions: Fall Restrictions Weight Bearing Restrictions: No      Mobility Bed Mobility Overal bed mobility: Needs Assistance Bed Mobility: Supine to Sit;Sit to Supine;Rolling Rolling: Min guard   Supine to sit: Mod assist Sit to supine: Min guard   General bed mobility comments: also extra time to move.  pt managed to maneuver himself around in supine, but had more difficulty using arms to scoot in sitting around the bed.  Transfers                 General transfer comment: not observed    Balance Overall balance assessment: Needs assistance Sitting-balance support: No upper extremity supported Sitting balance-Leahy Scale: Fair Sitting balance - Comments: Pt with able to use UEs in unsupported sitting within base of support.                                    ADL Overall ADL's : Needs assistance/impaired Eating/Feeding: Independent;Bed level   Grooming: Wash/dry hands;Wash/dry face;Minimal  assistance;Sitting   Upper Body Bathing: Moderate assistance;Sitting   Lower Body Bathing: Maximal assistance;Sitting/lateral leans   Upper Body Dressing : Minimal assistance;Sitting   Lower Body Dressing: Maximal assistance;Sitting/lateral leans                 General ADL Comments: Pt seen following PT evaluation and having had HD earlier today, fatigued     Vision     Perception     Praxis      Pertinent Vitals/Pain Pain Assessment: Faces Pain Score: 6  Faces Pain Scale: Hurts even more Pain Location: L LE Pain Descriptors / Indicators: Burning;Operative site guarding Pain Intervention(s): Limited activity within patient's tolerance;Monitored during session;Premedicated before session;Repositioned     Hand Dominance Right   Extremity/Trunk Assessment Upper Extremity Assessment Upper Extremity Assessment: Overall WFL for tasks assessed   Lower Extremity Assessment Lower Extremity Assessment: Defer to PT evaluation RLE Deficits / Details: mildly weak proximally, but functional LLE Deficits / Details: moves against gravity.       Communication Communication Communication: No difficulties   Cognition Arousal/Alertness: Awake/alert Behavior During Therapy: WFL for tasks assessed/performed Overall Cognitive Status: Within Functional Limits for tasks assessed                     General Comments       Exercises       Shoulder  Instructions      Home Living Family/patient expects to be discharged to:: Private residence Living Arrangements: Spouse/significant other Available Help at Discharge: Family;Available 24 hours/day Type of Home: House Home Access: Stairs to enter;Ramped entrance Entrance Stairs-Number of Steps: 2 Entrance Stairs-Rails: None Home Layout: One level     Bathroom Shower/Tub: Teacher, early years/pre: Handicapped height Bathroom Accessibility: Yes How Accessible: Accessible via wheelchair Home Equipment:  [place] - 2 wheels;Cane - quad;Shower seat;Wheelchair - manual;Electric scooter;grab bar on edge of tub          Prior Functioning/Environment Level of Independence: Needs assistance  Gait / Transfers Assistance Needed: pt uses w/c as primary means of mobility, transferred independently to bed/toilet/chair, assisted for tub transfers ADL's / Homemaking Assistance Needed: Wife assists pt to get down into jetted tub and washes pt's back, wife performs all housekeeping and meal prep   Comments: Pt does not like to shower.    OT Diagnosis: Generalized weakness;Acute pain   OT Problem List: Decreased strength;Decreased activity tolerance;Impaired balance (sitting and/or standing);Decreased knowledge of use of DME or AE;Pain   OT Treatment/Interventions: Self-care/ADL training;DME and/or AE instruction;Patient/family education;Balance training    OT Goals(Current goals can be found in the care plan section) Acute Rehab OT Goals Patient Stated Goal: Back independent at home OT Goal Formulation: With patient Time For Goal Achievement: 10/25/15 Potential to Achieve Goals: Good ADL Goals Pt Will Perform Grooming: with set-up;sitting Pt Will Perform Upper Body Bathing: with set-up;sitting Pt Will Perform Lower Body Bathing: with supervision;sitting/lateral leans Pt Will Perform Upper Body Dressing: with set-up;sitting Pt Will Perform Lower Body Dressing: with supervision;sitting/lateral leans Pt Will Transfer to Toilet: with supervision;bedside commode (lateral transfer to drop arm commode vs toilet) Pt Will Perform Toileting - Clothing Manipulation and hygiene: with supervision;sitting/lateral leans Additional ADL Goal #1: Pt will perform bed mobility at a modified independent level.  OT Frequency: Min 2X/week   Barriers to D/C:            Co-evaluation              End of Session    Activity Tolerance: Patient limited by fatigue Patient left: in bed;with call bell/phone  within reach;with family/visitor present   Time: 1520-1540 OT Time Calculation (min): 20 min Charges:  OT General Charges $OT Visit: 1 Procedure OT Evaluation $OT Eval Moderate Complexity: 1 Procedure G-Codes:    [name] 10/11/2015, 3:54 PM  [phone]

## 2015-10-11 NOTE — Progress Notes (Signed)
   VASCULAR SURGERY ASSESSMENT & PLAN:  * 1 Day Post-Op s/p: Left AKA  *  Dressing changed last PM for bleeding, but no further bleeding. Dressing change again tomorrow.   * Will likely need SNF prior to going home.  *HD: M-W-F  SUBJECTIVE: Pain well controlled.   PHYSICAL EXAM: Filed Vitals:   10/11/15 0700 10/11/15 0710 10/11/15 0715 10/11/15 0730  BP: 136/68 135/70 137/65 128/65  Pulse: 78 80 78 79  Temp: 98.8 F (37.1 C)     TempSrc: Oral     Resp: 20 18 16 15   Height:      Weight: 131 lb 2.8 oz (59.5 kg)     SpO2: 100% 100% 100% 100%   Dressing on left AKA is dry.   LABS: Lab Results  Component Value Date   WBC 8.2 10/11/2015   HGB 10.7* 10/11/2015   HCT 35.5* 10/11/2015   MCV 98.6 10/11/2015   PLT 242 10/11/2015   Lab Results  Component Value Date   CREATININE 9.88* 10/11/2015   Lab Results  Component Value Date   INR 1.03 01/05/2015   Active Problems:   Peripheral vascular disease with pain at rest [facility])  [name] Beeper: [id number] 10/11/2015

## 2015-10-11 NOTE — Progress Notes (Signed)
OT Cancellation Note  Patient Details Name: JAQUAN COLLIVER MRN: [id number] DOB: 12-09-1931   Cancelled Treatment:    Reason Eval/Treat Not Completed: Patient at procedure or test/ unavailable (HD. Will follow.)  [name] 10/11/2015, 8:28 AM

## 2015-10-11 NOTE — Progress Notes (Signed)
Called by nurse to eval bleeding from left AKA stump  Filed Vitals:   10/10/15 1259 10/10/15 1500 10/10/15 1730 10/10/15 1949  BP: 183/81 176/80 164/78 164/81  Pulse: 80 81 82 83  Temp: 98.3 F (36.8 C) 98.1 F (36.7 C)  98.1 F (36.7 C)  TempSrc: Oral Oral  Oral  Resp: 18 18  18   Height:      Weight:      SpO2: 99% 100%  98%     Dressing has been changed several hours ago per my request Currently no ongoing bleeding Continue to follow   Dr Scot Dock to see later this morning  Ruta Hinds, MD [facility] [place] Office: [phone] Pager: [phone]

## 2015-10-11 NOTE — Evaluation (Signed)
Physical Therapy Evaluation Patient Details Name: JAHRON BIXLER MRN: [id number] DOB: 01/01/1932 Today's Date: 10/11/2015   History of Present Illness  pt is an 82 y/o male with h/o NICM, HTN, PVD, CAD, stroke, ESRD and old R BKA s/p L AKA.  Clinical Impression  Pt admitted with/for AKA.  Pt currently limited functionally due to the problems listed. ( See problems list.)   Pt will benefit from PT to maximize function and safety in order to get ready for next venue listed below.      Follow Up Recommendations CIR    Equipment Recommendations  None recommended by PT    Recommendations for Other Services Rehab consult     Precautions / Restrictions Precautions Precautions: Fall      Mobility  Bed Mobility Overal bed mobility: Needs Assistance Bed Mobility: Supine to Sit;Sit to Supine     Supine to sit: Mod assist Sit to supine: Min guard   General bed mobility comments: also extra time to move.  pt managed to maneuver himself around in supine, but had more difficulty using arms to scoot in sitting around the bed.  Transfers                 General transfer comment: not observed  Ambulation/Gait                Stairs            Wheelchair Mobility    Modified Rankin (Stroke Patients Only)       Balance Overall balance assessment: Needs assistance Sitting-balance support: No upper extremity supported Sitting balance-Leahy Scale: Fair Sitting balance - Comments: sat EOB x 5 min working on balance with no UE assist and adding hip flexion/ext exercis bil as a challenge to balance.                                     Pertinent Vitals/Pain Pain Assessment: 0-10 Pain Score: 6  Pain Location: L stump Pain Descriptors / Indicators: Burning;Sore Pain Intervention(s): Monitored during session;Repositioned    Home Living Family/patient expects to be discharged to:: Private residence Living Arrangements: Spouse/significant  other Available Help at Discharge: Family;Available 24 hours/day Type of Home: House Home Access: Stairs to enter;Ramped entrance Entrance Stairs-Rails: None Entrance Stairs-Number of Steps: 2 Home Layout: One level Home Equipment: Walker - 2 wheels;Cane - quad;Shower seat;Wheelchair - Press photographer      Prior Function Level of Independence: Independent with assistive device(s)         Comments: Patient states he has both a walk-in shower and a tub/shower, wife assisted with transfers in/out tub/shower     Hand Dominance   Dominant Hand: Right    Extremity/Trunk Assessment   Upper Extremity Assessment: Defer to OT evaluation           Lower Extremity Assessment: RLE deficits/detail;LLE deficits/detail RLE Deficits / Details: mildly weak proximally, but functional LLE Deficits / Details: moves against gravity.     Communication   Communication: No difficulties  Cognition Arousal/Alertness: Awake/alert Behavior During Therapy: WFL for tasks assessed/performed Overall Cognitive Status: Within Functional Limits for tasks assessed                      General Comments      Exercises        Assessment/Plan    PT Assessment Patient needs continued PT services  PT Diagnosis  Generalized weakness;Acute pain   PT Problem List Decreased strength;Decreased activity tolerance;Decreased balance;Decreased mobility;Decreased knowledge of use of DME;Pain  PT Treatment Interventions DME instruction;Functional mobility training;Therapeutic activities;Therapeutic exercise;Patient/family education   PT Goals (Current goals can be found in the Care Plan section) Acute Rehab PT Goals Patient Stated Goal: Back independent at home PT Goal Formulation: With patient Time For Goal Achievement: 10/25/15 Potential to Achieve Goals: Good    Frequency Min 3X/week   Barriers to discharge        Co-evaluation               End of Session Equipment  Utilized During Treatment: Oxygen Activity Tolerance: Patient tolerated treatment well;Patient limited by fatigue Patient left: in bed;with call bell/phone within reach;with family/visitor present Nurse Communication: Mobility status         Time: [id number] PT Time Calculation (min) (ACUTE ONLY): 21 min   Charges:   PT Evaluation $PT Eval Moderate Complexity: 1 Procedure     PT G Codes:        Caroll Weinheimer, Tessie Fass 10/11/2015, 3:36 PM  10/11/2015  Donnella Sham, PT [phone]  (pager)

## 2015-10-11 NOTE — Procedures (Signed)
Tolerating hemodialysis.  Goal of 3000cc in hospital and post amputation prob generous.  Will watch for hemodynamic issues.  Will need a new EDW. Coti Burd C

## 2015-10-12 ENCOUNTER — Inpatient Hospital Stay (HOSPITAL_COMMUNITY)
Admission: RE | Admit: 2015-10-12 | Discharge: 2015-10-21 | DRG: 559 | Disposition: A | Discharge status: Home Health | Payer: Medicare Other | Source: Intra-hospital | Attending: Physical Medicine & Rehabilitation | Admitting: Physical Medicine & Rehabilitation

## 2015-10-12 DIAGNOSIS — K5903 Drug induced constipation: Secondary | ICD-10-CM | POA: Diagnosis present

## 2015-10-12 DIAGNOSIS — R079 Chest pain, unspecified: Secondary | ICD-10-CM

## 2015-10-12 DIAGNOSIS — R0600 Dyspnea, unspecified: Secondary | ICD-10-CM

## 2015-10-12 DIAGNOSIS — D500 Iron deficiency anemia secondary to blood loss (chronic): Secondary | ICD-10-CM | POA: Diagnosis present

## 2015-10-12 DIAGNOSIS — N186 End stage renal disease: Secondary | ICD-10-CM | POA: Diagnosis present

## 2015-10-12 DIAGNOSIS — Z89511 Acquired absence of right leg below knee: Secondary | ICD-10-CM

## 2015-10-12 DIAGNOSIS — Z89611 Acquired absence of right leg above knee: Secondary | ICD-10-CM | POA: Insufficient documentation

## 2015-10-12 DIAGNOSIS — Z992 Dependence on renal dialysis: Secondary | ICD-10-CM

## 2015-10-12 DIAGNOSIS — I429 Cardiomyopathy, unspecified: Secondary | ICD-10-CM | POA: Diagnosis present

## 2015-10-12 DIAGNOSIS — Z89612 Acquired absence of left leg above knee: Secondary | ICD-10-CM

## 2015-10-12 DIAGNOSIS — I5042 Chronic combined systolic (congestive) and diastolic (congestive) heart failure: Secondary | ICD-10-CM

## 2015-10-12 DIAGNOSIS — I739 Peripheral vascular disease, unspecified: Secondary | ICD-10-CM | POA: Diagnosis present

## 2015-10-12 DIAGNOSIS — R269 Unspecified abnormalities of gait and mobility: Secondary | ICD-10-CM | POA: Diagnosis present

## 2015-10-12 DIAGNOSIS — Z4781 Encounter for orthopedic aftercare following surgical amputation: Principal | ICD-10-CM

## 2015-10-12 DIAGNOSIS — I2510 Atherosclerotic heart disease of native coronary artery without angina pectoris: Secondary | ICD-10-CM

## 2015-10-12 DIAGNOSIS — D638 Anemia in other chronic diseases classified elsewhere: Secondary | ICD-10-CM | POA: Diagnosis present

## 2015-10-12 DIAGNOSIS — Z8673 Personal history of transient ischemic attack (TIA), and cerebral infarction without residual deficits: Secondary | ICD-10-CM

## 2015-10-12 DIAGNOSIS — Z89619 Acquired absence of unspecified leg above knee: Secondary | ICD-10-CM | POA: Insufficient documentation

## 2015-10-12 DIAGNOSIS — I10 Essential (primary) hypertension: Secondary | ICD-10-CM | POA: Insufficient documentation

## 2015-10-12 DIAGNOSIS — M6289 Other specified disorders of muscle: Secondary | ICD-10-CM | POA: Insufficient documentation

## 2015-10-12 DIAGNOSIS — R0989 Other specified symptoms and signs involving the circulatory and respiratory systems: Secondary | ICD-10-CM | POA: Insufficient documentation

## 2015-10-12 DIAGNOSIS — E785 Hyperlipidemia, unspecified: Secondary | ICD-10-CM

## 2015-10-12 DIAGNOSIS — M25539 Pain in unspecified wrist: Secondary | ICD-10-CM

## 2015-10-12 DIAGNOSIS — I248 Other forms of acute ischemic heart disease: Secondary | ICD-10-CM | POA: Diagnosis present

## 2015-10-12 DIAGNOSIS — F4321 Adjustment disorder with depressed mood: Secondary | ICD-10-CM

## 2015-10-12 DIAGNOSIS — S78119A Complete traumatic amputation at level between unspecified hip and knee, initial encounter: Secondary | ICD-10-CM

## 2015-10-12 DIAGNOSIS — G8918 Other acute postprocedural pain: Secondary | ICD-10-CM

## 2015-10-12 DIAGNOSIS — I132 Hypertensive heart and chronic kidney disease with heart failure and with stage 5 chronic kidney disease, or end stage renal disease: Secondary | ICD-10-CM

## 2015-10-12 LAB — GLUCOSE, CAPILLARY
GLUCOSE-CAPILLARY: 133 mg/dL — AB (ref 65–99)
Glucose-Capillary: 102 mg/dL — ABNORMAL HIGH (ref 65–99)
Glucose-Capillary: 111 mg/dL — ABNORMAL HIGH (ref 65–99)

## 2015-10-12 LAB — CBC
HEMATOCRIT: 34.3 % — AB (ref 39.0–52.0)
HEMOGLOBIN: 10.2 g/dL — AB (ref 13.0–17.0)
MCH: 29 pg (ref 26.0–34.0)
MCHC: 29.7 g/dL — ABNORMAL LOW (ref 30.0–36.0)
MCV: 97.4 fL (ref 78.0–100.0)
Platelets: 192 10*3/uL (ref 150–400)
RBC: 3.52 MIL/uL — ABNORMAL LOW (ref 4.22–5.81)
RDW: 18.5 % — ABNORMAL HIGH (ref 11.5–15.5)
WBC: 6.7 10*3/uL (ref 4.0–10.5)

## 2015-10-12 LAB — BASIC METABOLIC PANEL
Anion gap: 12 (ref 5–15)
BUN: 29 mg/dL — ABNORMAL HIGH (ref 6–20)
CHLORIDE: 93 mmol/L — AB (ref 101–111)
CO2: 27 mmol/L (ref 22–32)
Calcium: 9 mg/dL (ref 8.9–10.3)
Creatinine, Ser: 6.75 mg/dL — ABNORMAL HIGH (ref 0.61–1.24)
GFR, EST AFRICAN AMERICAN: 8 mL/min — AB (ref >60)
GFR, EST NON AFRICAN AMERICAN: 7 mL/min — AB (ref >60)
GLUCOSE: 116 mg/dL — AB (ref 65–99)
Potassium: 4.7 mmol/L (ref 3.5–5.1)
SODIUM: 132 mmol/L — AB (ref 135–145)

## 2015-10-12 MED ORDER — NEPRO/CARBSTEADY PO LIQD
237.0000 mL | Freq: Two times a day (BID) | ORAL | Status: DC
  Filled 2015-10-12 (×3): qty 237

## 2015-10-12 MED ORDER — TRAZODONE HCL 50 MG PO TABS: 25.0000 mg | ORAL_TABLET | Freq: Every evening | ORAL | Status: DC | PRN

## 2015-10-12 MED ORDER — ALUMINUM HYDROXIDE GEL 320 MG/5ML PO SUSP
10.0000 mL | Freq: Four times a day (QID) | ORAL | Status: DC | PRN
  Filled 2015-10-12: qty 30

## 2015-10-12 MED ORDER — CARVEDILOL 25 MG PO TABS
25.0000 mg | ORAL_TABLET | Freq: Two times a day (BID) | ORAL | Status: DC
  Administered 2015-10-13 – 2015-10-21 (×15): 25 mg via ORAL
  Filled 2015-10-12 (×15): qty 1

## 2015-10-12 MED ORDER — PANTOPRAZOLE SODIUM 40 MG PO TBEC
40.0000 mg | DELAYED_RELEASE_TABLET | Freq: Every day | ORAL | Status: DC
  Administered 2015-10-13 – 2015-10-21 (×9): 40 mg via ORAL
  Filled 2015-10-12 (×9): qty 1

## 2015-10-12 MED ORDER — LATANOPROST 0.005 % OP SOLN
1.0000 [drp] | Freq: Every day | OPHTHALMIC | Status: DC
  Administered 2015-10-12 – 2015-10-20 (×8): 1 [drp] via OPHTHALMIC
  Filled 2015-10-12 (×2): qty 2.5

## 2015-10-12 MED ORDER — PHENOL 1.4 % MT LIQD: 1.0000 | OROMUCOSAL | Status: DC | PRN

## 2015-10-12 MED ORDER — ISOSORBIDE MONONITRATE ER 30 MG PO TB24
15.0000 mg | ORAL_TABLET | Freq: Every day | ORAL | Status: DC
  Administered 2015-10-12 – 2015-10-16 (×5): 15 mg via ORAL
  Filled 2015-10-12 (×5): qty 1

## 2015-10-12 MED ORDER — SODIUM CHLORIDE 0.9 % IV SOLN: 100.0000 mL | INTRAVENOUS | Status: DC | PRN

## 2015-10-12 MED ORDER — TRAMADOL HCL 50 MG PO TABS
25.0000 mg | ORAL_TABLET | Freq: Four times a day (QID) | ORAL | Status: DC | PRN
  Administered 2015-10-13: 50 mg via ORAL
  Administered 2015-10-15 – 2015-10-19 (×2): 25 mg via ORAL
  Filled 2015-10-12 (×3): qty 1

## 2015-10-12 MED ORDER — METHOCARBAMOL 500 MG PO TABS
500.0000 mg | ORAL_TABLET | Freq: Four times a day (QID) | ORAL | Status: DC | PRN
Start: 2015-10-12 — End: 2015-10-21
  Administered 2015-10-13 – 2015-10-18 (×5): 500 mg via ORAL
  Filled 2015-10-12 (×6): qty 1

## 2015-10-12 MED ORDER — PROCHLORPERAZINE MALEATE 5 MG PO TABS: 5.0000 mg | ORAL_TABLET | Freq: Four times a day (QID) | ORAL | Status: DC | PRN

## 2015-10-12 MED ORDER — DOXYCYCLINE HYCLATE 100 MG PO TABS
100.0000 mg | ORAL_TABLET | Freq: Two times a day (BID) | ORAL | Status: DC
  Administered 2015-10-13 – 2015-10-14 (×3): 100 mg via ORAL
  Filled 2015-10-12 (×3): qty 1

## 2015-10-12 MED ORDER — GUAIFENESIN-DM 100-10 MG/5ML PO SYRP: 15.0000 mL | ORAL_SOLUTION | ORAL | Status: DC | PRN

## 2015-10-12 MED ORDER — ONDANSETRON HCL 4 MG/2ML IJ SOLN: 4.0000 mg | Freq: Four times a day (QID) | INTRAMUSCULAR | Status: DC | PRN

## 2015-10-12 MED ORDER — PROCHLORPERAZINE 25 MG RE SUPP: 12.5000 mg | Freq: Four times a day (QID) | RECTAL | Status: DC | PRN

## 2015-10-12 MED ORDER — DOXYCYCLINE HYCLATE 100 MG PO TABS: 100.0000 mg | ORAL_TABLET | Freq: Two times a day (BID) | ORAL | Status: DC

## 2015-10-12 MED ORDER — PROCHLORPERAZINE EDISYLATE 5 MG/ML IJ SOLN: 5.0000 mg | Freq: Four times a day (QID) | INTRAMUSCULAR | Status: DC | PRN

## 2015-10-12 MED ORDER — ASPIRIN 81 MG PO CHEW
81.0000 mg | CHEWABLE_TABLET | Freq: Every day | ORAL | Status: DC
  Administered 2015-10-13 – 2015-10-21 (×9): 81 mg via ORAL
  Filled 2015-10-12 (×9): qty 1

## 2015-10-12 MED ORDER — RENA-VITE PO TABS
1.0000 | ORAL_TABLET | Freq: Every day | ORAL | Status: DC
  Administered 2015-10-13: 1 via ORAL
  Filled 2015-10-12: qty 1

## 2015-10-12 MED ORDER — SENNOSIDES-DOCUSATE SODIUM 8.6-50 MG PO TABS
2.0000 | ORAL_TABLET | Freq: Every day | ORAL | Status: DC
  Administered 2015-10-14 – 2015-10-19 (×6): 2 via ORAL
  Filled 2015-10-12 (×6): qty 2

## 2015-10-12 MED ORDER — LISINOPRIL 10 MG PO TABS
10.0000 mg | ORAL_TABLET | Freq: Every day | ORAL | Status: DC
  Administered 2015-10-12 – 2015-10-20 (×9): 10 mg via ORAL
  Filled 2015-10-12 (×10): qty 1

## 2015-10-12 MED ORDER — ENOXAPARIN SODIUM 30 MG/0.3ML ~~LOC~~ SOLN
30.0000 mg | SUBCUTANEOUS | Status: DC
  Administered 2015-10-13 – 2015-10-20 (×8): 30 mg via SUBCUTANEOUS
  Filled 2015-10-12 (×8): qty 0.3

## 2015-10-12 MED ORDER — CALCITRIOL 0.5 MCG PO CAPS
0.5000 ug | ORAL_CAPSULE | Freq: Every day | ORAL | Status: DC
  Administered 2015-10-13 – 2015-10-16 (×4): 0.5 ug via ORAL
  Filled 2015-10-12 (×5): qty 1

## 2015-10-12 MED ORDER — OXYCODONE HCL 5 MG PO TABS
10.0000 mg | ORAL_TABLET | ORAL | Status: DC | PRN
  Administered 2015-10-12 – 2015-10-19 (×18): 10 mg via ORAL
  Filled 2015-10-12 (×18): qty 2

## 2015-10-12 MED ORDER — ACETAMINOPHEN 325 MG PO TABS
325.0000 mg | ORAL_TABLET | ORAL | Status: DC | PRN
  Administered 2015-10-17: 650 mg via ORAL
  Filled 2015-10-12: qty 2

## 2015-10-12 MED ORDER — SEVELAMER CARBONATE 800 MG PO TABS
1600.0000 mg | ORAL_TABLET | ORAL | Status: DC | PRN
  Administered 2015-10-19: 1600 mg via ORAL

## 2015-10-12 MED ORDER — SODIUM CHLORIDE 0.9 % IV SOLN
125.0000 mg | INTRAVENOUS | Status: DC
  Filled 2015-10-12: qty 10

## 2015-10-12 MED ORDER — DIPHENHYDRAMINE HCL 12.5 MG/5ML PO ELIX: 12.5000 mg | ORAL_SOLUTION | Freq: Four times a day (QID) | ORAL | Status: DC | PRN

## 2015-10-12 MED ORDER — ATORVASTATIN CALCIUM 40 MG PO TABS
40.0000 mg | ORAL_TABLET | Freq: Every day | ORAL | Status: DC
  Administered 2015-10-14 – 2015-10-19 (×6): 40 mg via ORAL
  Filled 2015-10-12 (×6): qty 1

## 2015-10-12 MED ORDER — GABAPENTIN 100 MG PO CAPS
100.0000 mg | ORAL_CAPSULE | Freq: Every day | ORAL | Status: DC
  Administered 2015-10-12 – 2015-10-20 (×9): 100 mg via ORAL
  Filled 2015-10-12 (×9): qty 1

## 2015-10-12 MED ORDER — AMLODIPINE BESYLATE 10 MG PO TABS
10.0000 mg | ORAL_TABLET | Freq: Every day | ORAL | Status: DC
  Administered 2015-10-12 – 2015-10-17 (×5): 10 mg via ORAL
  Filled 2015-10-12 (×6): qty 1

## 2015-10-12 MED ORDER — BISACODYL 10 MG RE SUPP
10.0000 mg | Freq: Every day | RECTAL | Status: DC | PRN
  Administered 2015-10-13 – 2015-10-15 (×2): 10 mg via RECTAL
  Filled 2015-10-12 (×2): qty 1

## 2015-10-12 MED ORDER — NEPRO/CARBSTEADY PO LIQD
237.0000 mL | Freq: Two times a day (BID) | ORAL | Status: DC
  Administered 2015-10-13 – 2015-10-19 (×9): 237 mL via ORAL

## 2015-10-12 MED ORDER — OXYCODONE HCL 5 MG PO TABS
10.0000 mg | ORAL_TABLET | Freq: Every day | ORAL | Status: DC
  Administered 2015-10-12 – 2015-10-16 (×5): 10 mg via ORAL
  Filled 2015-10-12 (×5): qty 2

## 2015-10-12 MED ORDER — SEVELAMER CARBONATE 800 MG PO TABS
2400.0000 mg | ORAL_TABLET | Freq: Three times a day (TID) | ORAL | Status: DC
  Administered 2015-10-13 – 2015-10-20 (×21): 2400 mg via ORAL
  Filled 2015-10-12 (×22): qty 3

## 2015-10-12 MED ORDER — DARBEPOETIN ALFA 100 MCG/0.5ML IJ SOSY
100.0000 ug | PREFILLED_SYRINGE | INTRAMUSCULAR | Status: DC
  Administered 2015-10-19: 100 ug via INTRAVENOUS
  Filled 2015-10-12: qty 0.5

## 2015-10-12 MED ORDER — FLEET ENEMA 7-19 GM/118ML RE ENEM: 1.0000 | ENEMA | Freq: Once | RECTAL | Status: DC | PRN

## 2015-10-12 MED ORDER — CLONIDINE HCL 0.2 MG PO TABS
0.2000 mg | ORAL_TABLET | Freq: Two times a day (BID) | ORAL | Status: DC | PRN
  Administered 2015-10-21: 0.2 mg via ORAL
  Filled 2015-10-12: qty 1

## 2015-10-12 NOTE — Progress Notes (Signed)
Patient ID: Randy [name], male   DOB: Oct 06, 1931, 82 y.o.   MRN: [id number] Patient admitted to 4MW05 via bed, escorted by nursing staff and family.  Patient verbalized understanding of rehab process, signed fall safety agreement.  Appears to be in no immediate distress at this time.  Brita Romp, RN

## 2015-10-12 NOTE — Progress Notes (Addendum)
  [facility] Progress Note  Subjective  - POD #2  Having left phantom pain.   Objective Filed Vitals:   10/11/15 2102 10/12/15 0602  BP: 141/58 153/76  Pulse: 75 83  Temp: 98.2 F (36.8 C) 98.1 F (36.7 C)  Resp: 18 17    Intake/Output Summary (Last 24 hours) at 10/12/15 0823 Last data filed at 10/12/15 0805  Gross per 24 hour  Intake    120 ml  Output   3005 ml  Net  -2885 ml    Left AKA incision clean and dry. No drainage.   Assessment/Planning: 82 y.o. male is s/p: left AKA  2 Days Post-Op   Left AKA stump healing well. CIR consult pending.  Dispo: medically stable for d/c to SNF vs CIR  Alvia Grove 10/12/2015 8:23 AM --  Laboratory CBC    Component Value Date/Time   WBC 6.7 10/12/2015 0250   WBC 5.3 02/22/2013 1643   HGB 10.2* 10/12/2015 0250   HGB 11.3* 02/22/2013 1643   HCT 34.3* 10/12/2015 0250   HCT 33.6* 02/22/2013 1643   PLT 192 10/12/2015 0250   PLT 240 02/22/2013 1643    BMET    Component Value Date/Time   NA 132* 10/12/2015 0250   NA 136 02/22/2013 1643   K 4.7 10/12/2015 0250   K 3.6 02/22/2013 1643   CL 93* 10/12/2015 0250   CL 103 02/22/2013 1643   CO2 27 10/12/2015 0250   CO2 24 02/22/2013 1643   GLUCOSE 116* 10/12/2015 0250   GLUCOSE 87 02/22/2013 1643   BUN 29* 10/12/2015 0250   BUN 48* 02/22/2013 1643   CREATININE 6.75* 10/12/2015 0250   CREATININE 6.02* 02/22/2013 1643   CALCIUM 9.0 10/12/2015 0250   CALCIUM 8.9 02/22/2013 1643   CALCIUM 8.1* 06/24/2012 1634   GFRNONAA 7* 10/12/2015 0250   GFRNONAA 8* 02/22/2013 1643   GFRAA 8* 10/12/2015 0250   GFRAA 9* 02/22/2013 1643    COAG Lab Results  Component Value Date   INR 1.03 01/05/2015   INR 1.12 12/30/2014   INR 1.04 09/24/2014   No results found for: PTT  Antibiotics Anti-infectives    Start     Dose/Rate Route Frequency Ordered Stop   10/11/15 0800  cefUROXime (ZINACEF) 1.5 g in dextrose 5 % 50 mL IVPB     1.5 g 100 mL/hr over 30  Minutes Intravenous Every 12 hours 10/10/15 1133 10/11/15 1430   10/10/15 2000  cefUROXime (ZINACEF) 1.5 g in dextrose 5 % 50 mL IVPB  Status:  Discontinued     1.5 g 100 mL/hr over 30 Minutes Intravenous Every 12 hours 10/10/15 1115 10/10/15 1133   10/10/15 1130  doxycycline (VIBRA-TABS) tablet 100 mg     100 mg Oral Every 12 hours 10/10/15 1115     10/10/15 0700  cefUROXime (ZINACEF) 1.5 g in dextrose 5 % 50 mL IVPB     1.5 g 100 mL/hr over 30 Minutes Intravenous To [facility] 10/09/15 1118 10/10/15 [place], PA-C [facility] Office: [phone] Pager: [phone] 10/12/2015 8:23 AM  Agree with above.  [name], MD, [place] [phone] Office: [phone]

## 2015-10-12 NOTE — Discharge Summary (Signed)
[facility] Discharge Summary  Randy [name] [birth year] 82 y.o. male  [id number]  Admission Date: 10/10/2015  Discharge Date: 10/12/2015  Physician: Angelia Mould, MD  Admission Diagnosis: Left foot pain M79.672  HPI:   This is a 82 y.o. male who was last seen on 03/24/2015 with left foot pain. He had undergone a previous right below-the-knee amputation on 01/05/2015. Of note, he has undergone a previous CT angiogram which was done on 12/31/2014. This showed diffuse atherosclerotic disease involving the aorta, iliac arteries, and also diffuse infrainguinal arterial occlusive disease. His only runoff bilaterally was the peroneal arteries. At the time of his last visit Dr. Scot Dock did not think he was a candidate for revascularization. He is 82 years old, has end-stage renal disease on dialysis, coronary artery disease, congestive heart failure, and is on home O2. Dr. Scot Dock recommended a primary amputation if the pain were not tolerable.  Hospital Course:  The patient was admitted to the hospital and taken to the operating room on 10/10/2015 and underwent:  Left above-knee amputation    The patient tolerated the procedure well and was transported to the PACU in stable condition.   Nephrology was consult for dialysis management. His dressing was changed on the evening of his surgery due to bleeding. There was no further bleeding seen on postop day 1.  His dressing was taken down on postop day 2. His incision was clean and dry without any active bleeding. His stump appeared viable. The patient was medically stable for discharge. He was evaluated by physical therapy and was deemed a good inpatient rehabilitation candidate. He was seen by inpatient rehabilitation and  was approved for admission. He was discharged to see CIR on postop day 2 in good condition.    CBC    Component Value Date/Time   WBC 6.7 10/12/2015 0250   WBC 5.3 02/22/2013 1643   RBC 3.52*  10/12/2015 0250   RBC 3.65* 02/22/2013 1643   RBC 3.21* 10/08/2012 2241   HGB 10.2* 10/12/2015 0250   HGB 11.3* 02/22/2013 1643   HCT 34.3* 10/12/2015 0250   HCT 33.6* 02/22/2013 1643   PLT 192 10/12/2015 0250   PLT 240 02/22/2013 1643   MCV 97.4 10/12/2015 0250   MCV 92 02/22/2013 1643   MCH 29.0 10/12/2015 0250   MCH 30.9 02/22/2013 1643   MCHC 29.7* 10/12/2015 0250   MCHC 33.6 02/22/2013 1643   RDW 18.5* 10/12/2015 0250   RDW 15.3* 02/22/2013 1643   LYMPHSABS 0.8 10/02/2015 2146   MONOABS 1.0 10/02/2015 2146   EOSABS 0.1 10/02/2015 2146   BASOSABS 0.0 10/02/2015 2146    BMET    Component Value Date/Time   NA 132* 10/12/2015 0250   NA 136 02/22/2013 1643   K 4.7 10/12/2015 0250   K 3.6 02/22/2013 1643   CL 93* 10/12/2015 0250   CL 103 02/22/2013 1643   CO2 27 10/12/2015 0250   CO2 24 02/22/2013 1643   GLUCOSE 116* 10/12/2015 0250   GLUCOSE 87 02/22/2013 1643   BUN 29* 10/12/2015 0250   BUN 48* 02/22/2013 1643   CREATININE 6.75* 10/12/2015 0250   CREATININE 6.02* 02/22/2013 1643   CALCIUM 9.0 10/12/2015 0250   CALCIUM 8.9 02/22/2013 1643   CALCIUM 8.1* 06/24/2012 1634   GFRNONAA 7* 10/12/2015 0250   GFRNONAA 8* 02/22/2013 1643   GFRAA 8* 10/12/2015 0250   GFRAA 9* 02/22/2013 1643     Discharge Instructions:   The patient is discharged to  CIR with extensive instructions on wound care and progressive ambulation.  They are instructed not to drive or perform any heavy lifting until returning to see the physician in his office.    Discharge Diagnosis:  Left foot pain M79.672  Secondary Diagnosis: Patient Active Problem List   Diagnosis Date Noted  . Abnormality of gait   . Status post above knee amputation of left lower extremity ([place])   . Chronic combined systolic and diastolic congestive heart failure ([place])   . ESRD on dialysis ([place])   . Essential hypertension   . Post-operative pain   . Peripheral vascular disease with pain at rest [facility]) 10/10/2015    . Chest pain 08/04/2015  . PD catheter dysfunction ([place]) 03/13/2015  . Cellulitis and abscess of foot   . Kidney mass   . Other emphysema ([place])   . Acute blood loss anemia   . Physical deconditioning   . S/P BKA (below knee amputation) unilateral ([place])   . Renal mass 01/01/2015  . Foot pain 12/30/2014  . Foot pain, right 12/30/2014  . CHF (congestive heart failure) ([place]) 12/30/2014  . CAD (coronary artery disease) 12/30/2014  . Hyperkalemia 12/30/2014  . ESRD (end stage renal disease) on dialysis ([place])   . Blood loss anemia   . Lower GI bleed 09/21/2014  . Fluid overload 08/29/2014  . Acute on chronic combined systolic and diastolic CHF (congestive heart failure) ([place]) 08/29/2014  . Acute respiratory failure with hypoxia ([place]) 08/29/2014  . Acute respiratory failure with hypoxemia ([place]) 08/29/2014  . Acute myopericarditis 08/18/2014  . Elevated troponin 08/18/2014  . Anemia of chronic disease   . End stage renal disease, has been peritoneal, now hemodialysis 07/28/2012  . Secondary cardiomyopathy ([place]) 06/26/2012  . HLD (hyperlipidemia) 12/10/2006  . Hypertensive heart disease 08/18/2006  . OSTEOARTHRITIS 08/18/2006  . Polycystic kidney 08/18/2006   Past Medical History  Diagnosis Date  . Hypercholesterolemia   . Gout   . Nonischemic cardiomyopathy (HCC)     LVEF 35-40%  . Anemia of chronic disease   . Essential hypertension   . Arthritis   . Peripheral vascular disease ([place])     Status post right below knee amputation for a nonhealing wound of the right foot 12/2014  . History of pneumonia 2014  . CAD (coronary artery disease)     Moderate multivessel disease 07/2015 - managed medically  . History of myopericarditis 2015    07/2015  . Stroke [facility])     "they said I did"  "I did not know anything about it"  . ESRD on hemodialysis [facility])     M/W/F in [place] - Dr. Florene Glen  . Constipation   . History of blood transfusion   . Pneumonia   . Acute myopericarditis  2015       Medication List    ASK your doctor about these medications        amLODipine 10 MG tablet  Commonly known as:  NORVASC  Take 1 tablet (10 mg total) by mouth daily.     aspirin 81 MG tablet  Take 81 mg by mouth daily.     atorvastatin 40 MG tablet  Commonly known as:  LIPITOR  Take 1 tablet (40 mg total) by mouth daily at 6 PM.     calcitRIOL 0.5 MCG capsule  Commonly known as:  ROCALTROL  Take 1 capsule (0.5 mcg total) by mouth daily.     carvedilol 25 MG tablet  Commonly known as:  COREG  Take 1 tablet (25 mg total) by mouth 2 (two) times daily with a meal.     cloNIDine 0.2 MG tablet  Commonly known as:  CATAPRES  Take 0.2 mg by mouth 2 (two) times daily as needed (blood pressure >130/27).     doxycycline 100 MG tablet  Commonly known as:  VIBRA-TABS  Take 1 tablet (100 mg total) by mouth every 12 (twelve) hours.     feeding supplement Liqd  Take 1 Container by mouth 3 (three) times daily between meals.     gabapentin 100 MG capsule  Commonly known as:  NEURONTIN  Take 1 capsule (100 mg total) by mouth 3 (three) times daily.     hydrALAZINE 25 MG tablet  Commonly known as:  APRESOLINE  Take 1 tablet (25 mg total) by mouth 3 (three) times daily.     isosorbide mononitrate 30 MG 24 hr tablet  Commonly known as:  IMDUR  Take 0.5 tablets (15 mg total) by mouth at bedtime.     lisinopril 10 MG tablet  Commonly known as:  PRINIVIL,ZESTRIL  Take 1 tablet by mouth daily.     LUMIGAN 0.01 % Soln  Generic drug:  bimatoprost  Place 1 drop into both eyes at bedtime.     multivitamin Tabs tablet  Take 1 tablet by mouth daily.     Oxycodone HCl 10 MG Tabs  Take 10 mg by mouth every 6 (six) hours as needed (for pain).     oxyCODONE-acetaminophen 5-325 MG tablet  Commonly known as:  PERCOCET/ROXICET  Take 1 tablet by mouth every 6 (six) hours as needed for severe pain.     predniSONE 20 MG tablet  Commonly known as:  DELTASONE  Take 2 tablets by  mouth daily X 2 days; then 1 tablet by mouth daily X 2 days; then 1/2 tablet by mouth daily X 3 days and stop prednisone     sevelamer carbonate 800 MG tablet  Commonly known as:  RENVELA  Take 1,600-2,400 mg by mouth See admin instructions. Take 3 tablets (2400 mg) with meals and 2 tablets (1600 mg) with snacks     SIMBRINZA 1-0.2 % Susp  Generic drug:  Brinzolamide-Brimonidine  Place 1 drop into both eyes 2 (two) times daily.        Disposition: CIR  Patient's condition: is Good  Follow up: 1. Dr. Scot Dock in 4 weeks   Virgina Jock, PA-C [facility] [phone] 10/12/2015  4:39 PM

## 2015-10-12 NOTE — Clinical Social Work Note (Addendum)
Clinical Social Work Assessment  Patient Details  Name: Randy [name] MRN: [id number] Date of Birth: November 16, 1931  Date of referral:  10/12/15               Reason for consult:  Facility Placement                Permission sought to share information with:  Chartered certified accountant granted to share information::  Yes, Verbal Permission Granted  Name::        Agency::  [facility]  Relationship::     Contact Information:     Housing/Transportation Living arrangements for the past 2 months:  Single Family Home Source of Information:  Patient Patient Interpreter Needed:  None Criminal Activity/Legal Involvement Pertinent to Current Situation/Hospitalization:  No - Comment as needed Significant Relationships:  Adult Children Lives with:  Spouse Do you feel safe going back to the place where you live?  No Need for family participation in patient care:  No (Coment)  Care giving concerns:  Pt is recent bilateral amputee- does not have sufficient support at home to accommodate his current level of impairment.   Social Worker assessment / plan:  CSW spoke with pt concerning PT recommendation for CIR.  CSW explained SNF as an alternative option is CIR is not able to admit.   Employment status:  Retired Nurse, adult PT Recommendations:  Inpatient [place] / Referral to community resources:  [place]  Patient/Family's Response to care: Pt is agreeable to SNF placement if CIR is unable to admit but expresses strong preference for CIR placment  Patient/Family's Understanding of and Emotional Response to Diagnosis, Current Treatment, and Prognosis:  No questions or concerns at this time.  Emotional Assessment Appearance:  Appears stated age Attitude/Demeanor/Rapport:    Affect (typically observed):  Appropriate, Accepting Orientation:  Oriented to Self, Oriented to Place, Oriented to  Time, Oriented to  Situation Alcohol / Substance use:  Not Applicable Psych involvement (Current and /or in the community):  No (Comment)  Discharge Needs  Concerns to be addressed:  Care Coordination Readmission within the last 30 days:  No Current discharge risk:  Physical Impairment Barriers to Discharge:  No Barriers Identified, Insurance Authorization   Cranford Mon, [place] 10/12/2015, 3:26 PM

## 2015-10-12 NOTE — Progress Notes (Signed)
Pt is agreeable to SNF if CIR cannot accept- prefers [facility] will continue to follow  Domenica Reamer, [place] Social Worker [phone]

## 2015-10-12 NOTE — Progress Notes (Signed)
[facility] Progress Note  Assessment/Plan: 1. s/p left AKA for nonhealing wound and progressive ischemia of left leg 1/17 - per Dr. Scot Dock; for rehab 2. ESRD - MWF - next HD Friday K 4.6 - he prefers first round - if not transferred to rehab before then. - tight heparin 3. Hypertension/volume -on multiple meds; hydralazine, coreg, norvasc and clonidne; net UF 3 L 1/18 with post weight 56.5; titrate volume while here 4. Anemia - Hgb 10.2 down somewhat - ESA due for redose 1/25;; last tsat was 28% with ferritin 1300 in October not on IV Fe; Expect ferritin to improve post amputation ; add IV Fe 125 x 4 5. Metabolic bone disease - Continue calcitriol Ca 9.0 /renvela P ok 6. Nutrition - alb 2.5 ,  Renal diet/vit/nepro 7. ID - not clear why he is on doxycycline - he does not know either  Myriam Jacobson, PA-C [place] [phone] 10/12/2015,10:35 AM  LOS: 2 days    Renal Attending:  Had an uneventful dialysis yesterday.  Will need to establish a new EDW> I agree with note above. Higinio Grow C   Subjective:   Hopes to go to rehab here. No problems with HD yesterday. States he is eating everything  Objective Filed Vitals:   10/11/15 1356 10/11/15 1555 10/11/15 2102 10/12/15 0602  BP: 167/76 133/71 141/58 153/76  Pulse: 89 84 75 83  Temp:  98.1 F (36.7 C) 98.2 F (36.8 C) 98.1 F (36.7 C)  TempSrc:  Oral Oral Oral  Resp:  17 18 17   Height:      Weight:    59.9 kg (132 lb 0.9 oz)  SpO2: 97% 95% 94% 98%   Physical Exam General: NAD Heart: RRR Lungs: no rales Abdomen: soft NT Extremities: right BKA no edema; left AKA wrapped Dialysis Access: left lower AVF + bruit  Dialysis Orders: [facility] MWF 3.5 hours EDW 62 - leaving below at times 180 400/800 2 K 2.25 Ca left lower AVF heparin 6600 Mircera 100 q 2 weeks last 1/11 (down from 150 before) calcitriol 0.5  Recent labs: Hgb 10.3 1/11 28% sat Ca P ok iPTH 560  Additional  Objective Labs: Basic Metabolic Panel:  Recent Labs Lab 10/11/15 0329 10/11/15 0830 10/11/15 1330 10/12/15 0250  NA 138  --  137 132*  K 4.9  --  3.7 4.7  CL 97*  --  97* 93*  CO2 26  --  29 27  GLUCOSE 79  --  130* 116*  BUN 53*  --  17 29*  CREATININE 9.88*  --  5.07* 6.75*  CALCIUM 8.5*  --  9.0 9.0  PHOS  --  3.6 4.2  --    Liver Function Tests:  Recent Labs Lab 10/10/15 0640 10/11/15 1330  AST 18  --   ALT 9*  --   ALKPHOS 56  --   BILITOT 0.6  --   PROT 6.6  --   ALBUMIN 2.8* 2.5*   CBC:  Recent Labs Lab 10/10/15 0640 10/11/15 0329 10/11/15 1330 10/12/15 0250  WBC 6.6 8.2 7.8 6.7  HGB 11.3* 10.7* 11.7* 10.2*  HCT 38.2* 35.5* 39.2 34.3*  MCV 99.5 98.6 98.0 97.4  PLT 231 242 219 192    CBG:  Recent Labs Lab 10/11/15 1611 10/11/15 2057 10/12/15 0610  GLUCAP 183* 105* 102*   Medications:   . amLODipine  10 mg Oral QHS  . aspirin  81 mg Oral Daily  . atorvastatin  40 mg Oral q1800  . calcitRIOL  0.5 mcg Oral Daily  . carvedilol  25 mg Oral BID WC  . cloNIDine  0.2 mg Oral Once  . [START ON 10/18/2015] darbepoetin (ARANESP) injection - DIALYSIS  100 mcg Intravenous Q Wed-HD  . docusate sodium  100 mg Oral Daily  . doxycycline  100 mg Oral Q12H  . enoxaparin (LOVENOX) injection  30 mg Subcutaneous Q24H  . feeding supplement  1 Container Oral TID BM  . ferric gluconate (FERRLECIT/NULECIT) IV  125 mg Intravenous Q M,W,F-HD  . gabapentin  100 mg Oral QHS  . isosorbide mononitrate  15 mg Oral QHS  . latanoprost  1 drop Both Eyes QHS  . lisinopril  10 mg Oral QHS  . multivitamin  1 tablet Oral Daily  . pantoprazole  40 mg Oral Daily  . sevelamer carbonate  2,400 mg Oral TID WC

## 2015-10-12 NOTE — Progress Notes (Signed)
Inpatient Rehabilitation  I met with the patient at the bedside and simultaneously communicated with his wife via speaker phone to discuss recommendation of IP Rehab.  I answered their questions and provided informational booklets.  They would like me to pursue insurance authorization for possible CIR admission.  I have initiated with [facility] and will provide an update as soon as I receive a decision.  I have discussed with Elenor Quinones RNCM and updated [facility] of need for SNF backup.  Please call if questions.  [place] Admissions Coordinator Cell [phone] Office [phone]

## 2015-10-12 NOTE — NC FL2 (Signed)
[place] LEVEL OF CARE SCREENING TOOL     IDENTIFICATION  Patient Name: Randy [name] Birthdate: September 10, 1932 Sex: male Admission Date (Current Location): 10/10/2015  [facility] and [place] Number:  [employer] and Address:  [facility] [place], [place] [address] [place], [place] [address]      Provider Number: [id number]  Attending Physician Name and Address:  Angelia Mould, MD  Relative Name and Phone Number:       Current Level of Care: SNF Recommended Level of Care: [place] Prior Approval Number:    Date Approved/Denied:   PASRR Number: [id number]  Discharge Plan: SNF    Current Diagnoses: Patient Active Problem List   Diagnosis Date Noted  . Abnormality of gait   . Status post above knee amputation of left lower extremity ([place])   . Chronic combined systolic and diastolic congestive heart failure ([place])   . ESRD on dialysis ([place])   . Essential hypertension   . Post-operative pain   . Peripheral vascular disease with pain at rest [facility]) 10/10/2015  . Chest pain 08/04/2015  . PD catheter dysfunction ([place]) 03/13/2015  . Cellulitis and abscess of foot   . Kidney mass   . Other emphysema ([place])   . Acute blood loss anemia   . Physical deconditioning   . S/P BKA (below knee amputation) unilateral ([place])   . Renal mass 01/01/2015  . Foot pain 12/30/2014  . Foot pain, right 12/30/2014  . CHF (congestive heart failure) ([place]) 12/30/2014  . CAD (coronary artery disease) 12/30/2014  . Hyperkalemia 12/30/2014  . ESRD (end stage renal disease) on dialysis ([place])   . Blood loss anemia   . Lower GI bleed 09/21/2014  . Fluid overload 08/29/2014  . Acute on chronic combined systolic and diastolic CHF (congestive heart failure) ([place]) 08/29/2014  . Acute respiratory failure with hypoxia ([place]) 08/29/2014  . Acute respiratory failure with hypoxemia ([place]) 08/29/2014  . Acute myopericarditis 08/18/2014  . Elevated  troponin 08/18/2014  . Anemia of chronic disease   . End stage renal disease, has been peritoneal, now hemodialysis 07/28/2012  . Secondary cardiomyopathy ([place]) 06/26/2012  . HLD (hyperlipidemia) 12/10/2006  . Hypertensive heart disease 08/18/2006  . OSTEOARTHRITIS 08/18/2006  . Polycystic kidney 08/18/2006    Orientation RESPIRATION BLADDER Height & Weight    Self, Time, Situation, Place  Normal Continent 5\' 9"  (175.3 cm) 132 lbs.  BEHAVIORAL SYMPTOMS/MOOD NEUROLOGICAL BOWEL NUTRITION STATUS      Continent Diet  AMBULATORY STATUS COMMUNICATION OF NEEDS Skin   Extensive Assist Verbally Normal                       Personal Care Assistance Level of Assistance  Bathing, Dressing Bathing Assistance: Maximum assistance   Dressing Assistance: Maximum assistance     Functional Limitations Info             SPECIAL CARE FACTORS FREQUENCY  PT (By licensed PT), OT (By licensed OT)     PT Frequency: 5/wk OT Frequency: 5/wk            Contractures      Additional Factors Info  Code Status, Allergies Code Status Info: FULL Allergies Info: NKA           Current Medications (10/12/2015):  This is the current hospital active medication list Current Facility-Administered Medications  Medication Dose Route Frequency Provider Last Rate Last Dose  . 0.9 %  sodium  chloride infusion  100 mL Intravenous PRN Alric Seton, PA-C      . 0.9 %  sodium chloride infusion  100 mL Intravenous PRN Alric Seton, PA-C      . acetaminophen (TYLENOL) tablet 325-650 mg  325-650 mg Oral Q4H PRN Angelia Mould, MD   650 mg at 10/12/15 0753   Or  . acetaminophen (TYLENOL) suppository 325-650 mg  325-650 mg Rectal Q4H PRN Angelia Mould, MD      . alteplase (CATHFLO ACTIVASE) injection 2 mg  2 mg Intracatheter Once PRN Alric Seton, PA-C      . amLODipine (NORVASC) tablet 10 mg  10 mg Oral QHS Alric Seton, PA-C   10 mg at 10/11/15 2058  . aspirin chewable tablet 81  mg  81 mg Oral Daily Angelia Mould, MD   81 mg at 10/11/15 1359  . atorvastatin (LIPITOR) tablet 40 mg  40 mg Oral q1800 Angelia Mould, MD   40 mg at 10/11/15 1729  . calcitRIOL (ROCALTROL) capsule 0.5 mcg  0.5 mcg Oral Daily Angelia Mould, MD   0.5 mcg at 10/11/15 1359  . carvedilol (COREG) tablet 25 mg  25 mg Oral BID WC Angelia Mould, MD   25 mg at 10/11/15 1729  . cloNIDine (CATAPRES) tablet 0.2 mg  0.2 mg Oral Once Lillia Abed, MD      . cloNIDine (CATAPRES) tablet 0.2 mg  0.2 mg Oral BID PRN Angelia Mould, MD   0.2 mg at 10/10/15 1512  . [START ON 10/18/2015] Darbepoetin Alfa (ARANESP) injection 100 mcg  100 mcg Intravenous Q Wed-HD Alric Seton, PA-C      . docusate sodium (COLACE) capsule 100 mg  100 mg Oral Daily Angelia Mould, MD   100 mg at 10/11/15 1359  . doxycycline (VIBRA-TABS) tablet 100 mg  100 mg Oral Q12H Angelia Mould, MD   100 mg at 10/11/15 2057  . enoxaparin (LOVENOX) injection 30 mg  30 mg Subcutaneous Q24H Angelia Mould, MD   30 mg at 10/11/15 0800  . feeding supplement (BOOST / RESOURCE BREEZE) liquid 1 Container  1 Container Oral TID BM Angelia Mould, MD   1 Container at 10/11/15 2104  . ferric gluconate (NULECIT) 125 mg in sodium chloride 0.9 % 100 mL IVPB  125 mg Intravenous Q M,W,F-HD Alric Seton, PA-C   125 mg at 10/11/15 1010  . gabapentin (NEURONTIN) capsule 100 mg  100 mg Oral QHS Angelia Mould, MD   100 mg at 10/11/15 2058  . guaiFENesin-dextromethorphan (ROBITUSSIN DM) 100-10 MG/5ML syrup 15 mL  15 mL Oral Q4H PRN Angelia Mould, MD      . heparin injection 1,000 Units  1,000 Units Dialysis PRN Alric Seton, PA-C      . hydrALAZINE (APRESOLINE) injection 5 mg  5 mg Intravenous Q20 Min PRN Angelia Mould, MD      . hydrALAZINE (APRESOLINE) tablet 25 mg  25 mg Oral TID PRN Angelia Mould, MD      . HYDROmorphone (DILAUDID) injection 0.5-1 mg  0.5-1 mg  Intravenous Q2H PRN Angelia Mould, MD   1 mg at 10/12/15 0753  . isosorbide mononitrate (IMDUR) 24 hr tablet 15 mg  15 mg Oral QHS Angelia Mould, MD   15 mg at 10/11/15 2057  . labetalol (NORMODYNE,TRANDATE) injection 10 mg  10 mg Intravenous Q10 min PRN Angelia Mould, MD      . latanoprost Ivin Poot)  0.005 % ophthalmic solution 1 drop  1 drop Both Eyes QHS Angelia Mould, MD   1 drop at 10/11/15 2101  . lidocaine (PF) (XYLOCAINE) 1 % injection 5 mL  5 mL Intradermal PRN Alric Seton, PA-C      . lidocaine-prilocaine (EMLA) cream 1 application  1 application Topical PRN Alric Seton, PA-C      . lisinopril (PRINIVIL,ZESTRIL) tablet 10 mg  10 mg Oral QHS Alric Seton, PA-C   10 mg at 10/11/15 2057  . metoprolol (LOPRESSOR) injection 2-5 mg  2-5 mg Intravenous Q2H PRN Angelia Mould, MD      . multivitamin (RENA-VIT) tablet 1 tablet  1 tablet Oral Daily Angelia Mould, MD   1 tablet at 10/11/15 1359  . ondansetron (ZOFRAN) injection 4 mg  4 mg Intravenous Q6H PRN Angelia Mould, MD      . oxyCODONE (Oxy IR/ROXICODONE) immediate release tablet 10 mg  10 mg Oral Q6H PRN Angelia Mould, MD   10 mg at 10/12/15 0626  . oxyCODONE-acetaminophen (PERCOCET/ROXICET) 5-325 MG per tablet 1-2 tablet  1-2 tablet Oral Q4H PRN Angelia Mould, MD   2 tablet at 10/11/15 1828  . pantoprazole (PROTONIX) EC tablet 40 mg  40 mg Oral Daily Angelia Mould, MD   40 mg at 10/11/15 1359  . pentafluoroprop-tetrafluoroeth (GEBAUERS) aerosol 1 application  1 application Topical PRN Alric Seton, PA-C      . phenol (CHLORASEPTIC) mouth spray 1 spray  1 spray Mouth/Throat PRN Angelia Mould, MD      . sevelamer carbonate (RENVELA) tablet 1,600 mg  1,600 mg Oral PRN Angelia Mould, MD      . sevelamer carbonate (RENVELA) tablet 2,400 mg  2,400 mg Oral TID WC Angelia Mould, MD   2,400 mg at 10/11/15 1729     Discharge  Medications: Please see discharge summary for a list of discharge medications.  Relevant Imaging Results:  Relevant Lab Results:   Additional Information SS#: [id number], Pt receives dialysis MWF at [facility]   [place], [place], [place]

## 2015-10-12 NOTE — Progress Notes (Signed)
Occupational Therapy Treatment Patient Details Name: Randy [name] MRN: [id number] DOB: Jun 16, 1932 Today's Date: 10/12/2015    History of present illness pt is an 82 y/o male with h/o NICM, HTN, PVD, CAD, stroke, ESRD and old R BKA s/p L AKA.   OT comments  Pt eager to return to bed after having been up in straight chair x 2 hours, including lunch. Family has brought R LE prosthesis to the hospital and pt utilized for transfers today.  Loose fitting. Pt agreeable for OT to call [employer] to check fit and offer modifications. Improved sitting balance for removal of prosthesis without requiring support. Inpatient rehab continues to be the optimal discharge plan.  Follow Up Recommendations  CIR    Equipment Recommendations       Recommendations for Other Services      Precautions / Restrictions Precautions Precautions: Fall Restrictions Weight Bearing Restrictions: No       Mobility Bed Mobility Overal bed mobility: Needs Assistance Bed Mobility: Sit to Supine     Sit to supine: Supervision   General bed mobility comments: no physical assist from sit to supine, used UEs and trendelenberg to pull himself up in bed.  Transfers Overall transfer level: Needs assistance      General transfer comment: assist to rise, balance and pivot    Balance Overall balance assessment: Needs assistance Sitting-balance support: Feet supported;Single extremity supported;Bilateral upper extremity supported Sitting balance-Leahy Scale: Fair Sitting balance - Comments: doffed prosthesis at EOB, without LOB Postural control: Posterior lean Standing balance support: Bilateral upper extremity supported Standing balance-Leahy Scale: Poor Standing balance comment: stood in RW on R prosthesis with significant assist and generally difficulty getting fully up over the prosthesis.                   ADL Overall ADL's : Needs assistance/impaired                     Lower Body  Dressing: Minimal assistance;Sitting/lateral leans Lower Body Dressing Details (indicate cue type and reason): to doff prosthesis               General ADL Comments: Pt ready to return to bed following up in chair for lunch.      Vision                     Perception     Praxis      Cognition   Behavior During Therapy: WFL for tasks assessed/performed Overall Cognitive Status: Within Functional Limits for tasks assessed                       Extremity/Trunk Assessment               Exercises     Shoulder Instructions       General Comments      Pertinent Vitals/ Pain       Pain Assessment: 0-10 Pain Score: 5  Pain Location: L LE Pain Descriptors / Indicators: Grimacing;Guarding;Sore Pain Intervention(s): Limited activity within patient's tolerance;Monitored during session;Premedicated before session;Repositioned  Home Living     Available Help at Discharge:  (wife and pt's niece Randy [name] assist pt.)                                    Prior Functioning/Environment  Frequency Min 2X/week     Progress Toward Goals  OT Goals(current goals can now be found in the care plan section)  Progress towards OT goals: Progressing toward goals  Acute Rehab OT Goals Patient Stated Goal: Back independent at home  Plan Discharge plan remains appropriate    Co-evaluation                 End of Session Equipment Utilized During Treatment: Rolling walker;Gait belt   Activity Tolerance Patient limited by fatigue   Patient Left in bed;with call bell/phone within reach;with family/visitor present;with bed alarm set   Nurse Communication Mobility status (call placed to [employer] at pt's request )        Time: [id number] OT Time Calculation (min): 20 min  Charges: OT General Charges $OT Visit: 1 Procedure OT Treatments $Therapeutic Activity: 8-22 mins  [name] 10/12/2015, 2:20 PM   [phone]

## 2015-10-12 NOTE — Progress Notes (Signed)
CSW informed of approval for CIR- anticipate DC to CIR today  CSW signing off  Domenica Reamer, [place] Social Worker [phone]

## 2015-10-12 NOTE — PMR Pre-admission (Signed)
PMR Admission Coordinator Pre-Admission Assessment  Patient: Randy [name] is an 82 y.o., male MRN: [id number] DOB: Dec 02, 1931 Height: 5\' 9"  (175.3 cm) Weight: 59.9 kg (132 lb 0.9 oz)              Insurance Information HMO: yes    PPO:      PCP:      IPA:      80/20:      OTHER:  PRIMARY:  BCBS Medicare      Policy#: [id number]      Subscriber:  self CM Name: Santiago Glad      Phone#: [phone]   Fax#:  [phone] Pre-Cert#:                       Employer:  retired Benefits:  Phone #:  [phone]     Name:  Driscilla Moats. Date:  09/24/15     Deduct:  $0      Out of Pocket Max:  $4900      Life Max:   CIR:   $275 per day, days 1-6; $0 days 7 and beyond      SNF:  $0 days 1-20, $164.50 (21-100) Outpatient:  $40 copay     Home Health:  100%      Co-Pay:   DME:  80%     Co-Pay:  20% Providers:  In network SECONDARY:       Policy#:       Subscriber:  CM Name:       Phone#:      Fax#:  Pre-Cert#:       Employer:  Benefits:  Phone #:      Name:  Eff. Date:      Deduct:       Out of Pocket Max:       Life Max:  CIR:       SNF:  Outpatient:      Co-Pay:  Home Health:       Co-Pay:  DME:      Co-Pay:   Medicaid Application Date:       Case Manager:  Disability Application Date:       Case Worker:   Emergency Contact Information Contact Information    Name Relation Home Work Mobile   Goetze,Bernice Spouse [phone]  [phone]   Hund,Deborah Daughter   [phone]   [facility] Daughter   [phone]   Kovacic,Jacqueline Son   [phone]     Current Medical History  Patient Admitting Diagnosis:  L-AKA due to PVD and h/o R-BKA History of Present Illness: Randy [name] is a 82 y.o. 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 Patient elected to undergo L-AKA on 10/10/15 by Dr. Donnetta Hutching. Post op has had bleeding from stump requiring dressing  changes. PT/OT evaluations to be done today and CIR consulted in anticipation of extensive rehab needs.      Past Medical History  Past Medical History  Diagnosis Date  . Hypercholesterolemia   . Gout   . Nonischemic cardiomyopathy (HCC)     LVEF 35-40%  . Anemia of chronic disease   . Essential hypertension   . Arthritis   . Peripheral vascular disease ([place])     Status post right below knee amputation for a nonhealing wound of the right foot 12/2014  . History of pneumonia 2014  . CAD (coronary artery  disease)     Moderate multivessel disease 07/2015 - managed medically  . History of myopericarditis 2015    07/2015  . Stroke [facility])     "they said I did"  "I did not know anything about it"  . ESRD on hemodialysis [facility])     M/W/F in [place] - Dr. Florene Glen  . Constipation   . History of blood transfusion   . Pneumonia   . Acute myopericarditis 2015    Family History  family history includes Cancer in his sister; Colon cancer in his brother and brother. There is no history of Anesthesia problems, Hypotension, Malignant hyperthermia, or Pseudochol deficiency.  Prior Rehab/Hospitalizations:  Has the patient had major surgery during 100 days prior to admission? No  Current Medications   Current facility-administered medications:  .  0.9 %  sodium chloride infusion, 100 mL, Intravenous, PRN, Alric Seton, PA-C .  0.9 %  sodium chloride infusion, 100 mL, Intravenous, PRN, Alric Seton, PA-C .  acetaminophen (TYLENOL) tablet 325-650 mg, 325-650 mg, Oral, Q4H PRN, 650 mg at 10/12/15 0753 **OR** acetaminophen (TYLENOL) suppository 325-650 mg, 325-650 mg, Rectal, Q4H PRN, Angelia Mould, MD .  amLODipine (NORVASC) tablet 10 mg, 10 mg, Oral, QHS, Alric Seton, PA-C, 10 mg at 10/11/15 2058 .  aspirin chewable tablet 81 mg, 81 mg, Oral, Daily, Angelia Mould, MD, 81 mg at 10/12/15 1214 .  atorvastatin (LIPITOR) tablet 40 mg, 40 mg, Oral, q1800, Angelia Mould,  MD, 40 mg at 10/11/15 1729 .  calcitRIOL (ROCALTROL) capsule 0.5 mcg, 0.5 mcg, Oral, Daily, Angelia Mould, MD, 0.5 mcg at 10/12/15 1215 .  carvedilol (COREG) tablet 25 mg, 25 mg, Oral, BID WC, Angelia Mould, MD, 25 mg at 10/12/15 1352 .  cloNIDine (CATAPRES) tablet 0.2 mg, 0.2 mg, Oral, Once, Lillia Abed, MD .  cloNIDine (CATAPRES) tablet 0.2 mg, 0.2 mg, Oral, BID PRN, Angelia Mould, MD, 0.2 mg at 10/10/15 1512 .  [START ON 10/18/2015] Darbepoetin Alfa (ARANESP) injection 100 mcg, 100 mcg, Intravenous, Q Wed-HD, Alric Seton, PA-C .  docusate sodium (COLACE) capsule 100 mg, 100 mg, Oral, Daily, Angelia Mould, MD, 100 mg at 10/12/15 1215 .  [START ON 10/13/2015] doxycycline (VIBRA-TABS) tablet 100 mg, 100 mg, Oral, Q12H, Angelia Mould, MD .  enoxaparin (LOVENOX) injection 30 mg, 30 mg, Subcutaneous, Q24H, Angelia Mould, MD, 30 mg at 10/12/15 1352 .  feeding supplement (NEPRO CARB STEADY) liquid 237 mL, 237 mL, Oral, BID BM, Alric Seton, PA-C .  ferric gluconate (NULECIT) 125 mg in sodium chloride 0.9 % 100 mL IVPB, 125 mg, Intravenous, Q M,W,F-HD, Alric Seton, PA-C, 125 mg at 10/11/15 1010 .  gabapentin (NEURONTIN) capsule 100 mg, 100 mg, Oral, QHS, Angelia Mould, MD, 100 mg at 10/11/15 2058 .  guaiFENesin-dextromethorphan (ROBITUSSIN DM) 100-10 MG/5ML syrup 15 mL, 15 mL, Oral, Q4H PRN, Angelia Mould, MD .  hydrALAZINE (APRESOLINE) injection 5 mg, 5 mg, Intravenous, Q20 Min PRN, Angelia Mould, MD .  hydrALAZINE (APRESOLINE) tablet 25 mg, 25 mg, Oral, TID PRN, Angelia Mould, MD .  HYDROmorphone (DILAUDID) injection 0.5-1 mg, 0.5-1 mg, Intravenous, Q2H PRN, Angelia Mould, MD, 1 mg at 10/12/15 1216 .  isosorbide mononitrate (IMDUR) 24 hr tablet 15 mg, 15 mg, Oral, QHS, Angelia Mould, MD, 15 mg at 10/11/15 2057 .  labetalol (NORMODYNE,TRANDATE) injection 10 mg, 10 mg, Intravenous, Q10 min PRN,  Angelia Mould, MD .  latanoprost (XALATAN) 0.005 % ophthalmic  solution 1 drop, 1 drop, Both Eyes, QHS, Angelia Mould, MD, 1 drop at 10/11/15 2101 .  lisinopril (PRINIVIL,ZESTRIL) tablet 10 mg, 10 mg, Oral, QHS, Alric Seton, PA-C, 10 mg at 10/11/15 2057 .  metoprolol (LOPRESSOR) injection 2-5 mg, 2-5 mg, Intravenous, Q2H PRN, Angelia Mould, MD .  multivitamin (RENA-VIT) tablet 1 tablet, 1 tablet, Oral, Daily, Angelia Mould, MD, 1 tablet at 10/12/15 1215 .  ondansetron (ZOFRAN) injection 4 mg, 4 mg, Intravenous, Q6H PRN, Angelia Mould, MD .  oxyCODONE (Oxy IR/ROXICODONE) immediate release tablet 10 mg, 10 mg, Oral, Q6H PRN, Angelia Mould, MD, 10 mg at 10/12/15 0626 .  oxyCODONE-acetaminophen (PERCOCET/ROXICET) 5-325 MG per tablet 1-2 tablet, 1-2 tablet, Oral, Q4H PRN, Angelia Mould, MD, 2 tablet at 10/11/15 1828 .  pantoprazole (PROTONIX) EC tablet 40 mg, 40 mg, Oral, Daily, Angelia Mould, MD, 40 mg at 10/12/15 1215 .  phenol (CHLORASEPTIC) mouth spray 1 spray, 1 spray, Mouth/Throat, PRN, Angelia Mould, MD .  sevelamer carbonate (RENVELA) tablet 1,600 mg, 1,600 mg, Oral, PRN, Angelia Mould, MD .  sevelamer carbonate (RENVELA) tablet 2,400 mg, 2,400 mg, Oral, TID WC, Angelia Mould, MD, 2,400 mg at 10/12/15 1214  Patients Current Diet: Diet renal with fluid restriction Fluid restriction:: 1200 mL Fluid; Room service appropriate?: Yes; Fluid consistency:: Thin  Precautions / Restrictions Precautions Precautions: Fall Restrictions Weight Bearing Restrictions: No   Has the patient had 2 or more falls or a fall with injury in the past year?No; pt. Has had one fall out of bed per wife within the last year but no injuries from fall  Prior Activity Level Limited Community (1-2x/wk): Pt. primarily goes out for hemodialysis and MD appointments.  Pt's friend transports him to hemodialysis  [place] /  [place] Devices/Equipment: Environmental consultant (specify type), Wheelchair, Bedside commode/3-in-1 Home Equipment: Environmental consultant - 2 wheels, [employer] - quad, Civil engineer, contracting, Wheelchair - manual, Transport planner  Prior Device Use: Indicate devices/aids used by the patient prior to current illness, exacerbation or injury? Manual wheelchair; R BKA prosthesis  Prior Functional Level Prior Function Level of Independence: Needs assistance Gait / Transfers Assistance Needed: pt uses w/c as primary means of mobility, transferred independently to bed/toilet/chair, assisted for tub transfers ADL's / Homemaking Assistance Needed: Wife assists pt to get down into jetted tub and washes pt's back, wife performs all housekeeping and meal prep Comments: Pt does not like to shower.  Self Care: Did the patient need help bathing, dressing, using the toilet or eating?  Needed some help  Indoor Mobility: Did the patient need assistance with walking from room to room (with or without device)? Independent  With wheelchair  Stairs: Did the patient need assistance with internal or external stairs (with or without device)? Needed some helppt. Used wheelchair ramp PTA  Functional Cognition: Did the patient need help planning regular tasks such as shopping or remembering to take medications? Needed some help  Current Functional Level Cognition  Overall Cognitive Status: Within Functional Limits for tasks assessed Orientation Level: Oriented X4    Extremity Assessment (includes Sensation/Coordination)  Upper Extremity Assessment: Overall WFL for tasks assessed  Lower Extremity Assessment: Defer to PT evaluation RLE Deficits / Details: mildly weak proximally, but functional LLE Deficits / Details: moves against gravity.    ADLs  Overall ADL's : Needs assistance/impaired Eating/Feeding: Independent, Bed level Grooming: Wash/dry hands, Wash/dry face, Minimal assistance, Sitting Upper Body Bathing: Moderate assistance,  Sitting Lower Body Bathing:  Maximal assistance, Sitting/lateral leans Upper Body Dressing : Minimal assistance, Sitting Lower Body Dressing: Minimal assistance, Sitting/lateral leans Lower Body Dressing Details (indicate cue type and reason): to doff prosthesis General ADL Comments: Pt ready to return to bed following up in chair for lunch.    Mobility  Overal bed mobility: Needs Assistance Bed Mobility: Sit to Supine Rolling: Min guard Supine to sit: Mod assist Sit to supine: Supervision General bed mobility comments: no physical assist from sit to supine, used UEs and trendelenberg to pull himself up in bed.    Transfers  Overall transfer level: Needs assistance Equipment used: Rolling walker (2 wheeled), None Transfers: Sit to/from Stand, Stand Pivot Transfers Sit to Stand: +2 physical assistance, Mod assist (from chair) Stand pivot transfers: +2 safety/equipment, Mod assist Squat pivot transfers: Mod assist, From elevated surface General transfer comment: assist to rise, balance and pivot    Ambulation / Gait / Stairs / Wheelchair Mobility       Posture / Balance Dynamic Sitting Balance Sitting balance - Comments: doffed prosthesis at EOB, without LOB Balance Overall balance assessment: Needs assistance Sitting-balance support: Feet supported, Single extremity supported, Bilateral upper extremity supported Sitting balance-Leahy Scale: Fair Sitting balance - Comments: doffed prosthesis at EOB, without LOB Postural control: Posterior lean Standing balance support: Bilateral upper extremity supported Standing balance-Leahy Scale: Poor Standing balance comment: stood in RW on R prosthesis with significant assist and generally difficulty getting fully up over the prosthesis.    Special needs/care consideration BiPAP/CPAP  no CPM  no Continuous Drip IV no Dialysis  Yes        Days MWF Life Vest no Oxygen  no Special Bed   no Trach Size   no Wound Vac (area)  no        Skin  WDL                               Bowel mgmt:last BM 10/09/15, continent: bed pan with assist Bladder mgmt: pt. Reports minimal urine production at baseline but non this admission Diabetic mgmt no     Previous Home Environment Living Arrangements: Spouse/significant other Available Help at Discharge:  (wife and pt's niece Shemar Hufnagle assist pt.) Type of Home: House Home Layout: One level Home Access: Stairs to enter, Ramped entrance Entrance Stairs-Rails: None Entrance Stairs-Number of Steps: 2 Bathroom Shower/Tub: Chiropodist: Handicapped height Bathroom Accessibility: Yes How Accessible: Accessible via wheelchair [place]:  (not currently receiving per wife; used [place]) Type of Home Care Services: Home PT, [place] (if known): [facility]  Discharge Living Setting Plans for Discharge Living Setting: Patient's home Type of Home at Discharge: House Discharge Home Layout: One level Discharge Home Access: Stairs to enter, [place] entrance Discharge Bathroom Shower/Tub: [place] unit Discharge Bathroom Toilet: Handicapped height Discharge Bathroom Accessibility: Yes How Accessible: Accessible via wheelchair Does the patient have any problems obtaining your medications?: No  Social/Family/Support Systems Patient Roles: Spouse, Parent (4 children in [place]) Contact Information: ; [phone] Anticipated Caregiver: wife Jahan Seabold and niece Dilon Sabetta who assists PRN Ability/Limitations of Caregiver: Oris Drone reports she drives and walks short distances without assist; needs transport chair she visits pt. in the hospital Caregiver Availability: 24/7 Discharge Plan Discussed with Primary Caregiver: Yes Is Caregiver In Agreement with Plan?: Yes Does Caregiver/Family have Issues with Lodging/Transportation while Pt is in Rehab?: No   Goals/Additional Needs Patient/Family Goal for Rehab: supervision  and modified independent for  PT/OT Expected length of stay: 10-13 days Cultural Considerations: none Dietary Needs: renal diet with 1200 ml fluid restrictions , thin liquids Equipment Needs: TBA Pt/Family Agrees to Admission and willing to participate: Yes Program Orientation Provided & Reviewed with Pt/Caregiver Including Roles  & Responsibilities: Yes   Decrease burden of Care through IP rehab admission: n/a   Possible need for SNF placement upon discharge:  Not anticipated   Patient Condition: This patient's condition remains as documented in the consult dated 10/11/15 , in which the Rehabilitation Physician determined and documented that the patient's condition is appropriate for intensive rehabilitative care in an inpatient rehabilitation facility. Will admit to inpatient rehab today.  Preadmission Screen Completed By:  Gerlean Ren, 10/12/2015 4:45 PM ______________________________________________________________________   Discussed status with Dr.  Letta Pate on 10/12/15 at  1649  and received telephone approval for admission today.  Admission Coordinator:  Gerlean Ren, time [id number] Sudie Grumbling 10/12/15

## 2015-10-12 NOTE — Progress Notes (Signed)
Physical Therapy Treatment Patient Details Name: Randy [name] MRN: [id number] DOB: [birth year] Today's Date: 10/12/2015    History of Present Illness pt is an 82 y/o male with h/o NICM, HTN, PVD, CAD, stroke, ESRD and old R BKA s/p L AKA.    PT Comments    Pt showed his discomfort and trying to balance EOB, needing stability assist for all tasks  Including sitting balance, donning prosthesis and mobility assist for standing and squat transfer on prosthesis.  I believe pt would get great benefit from CIR level therapy.  Follow Up Recommendations  CIR     Equipment Recommendations  None recommended by PT    Recommendations for Other Services Rehab consult     Precautions / Restrictions Precautions Precautions: Fall    Mobility  Bed Mobility   Bed Mobility: Supine to Sit     Supine to sit: Mod assist     General bed mobility comments: needed stability and w/shift aassist during scoot to EOB  Transfers Overall transfer level: Needs assistance Equipment used: Rolling walker (2 wheeled);None Transfers: Sit to/from [employer] Sit to Stand: Max assist;From elevated surface   Squat pivot transfers: Mod assist;From elevated surface     General transfer comment: cues for hand placement and prepping for transfer.  Stability assist during donning prosthesis.  Assist to come forward and power upl  Ambulation/Gait                 Stairs            Wheelchair Mobility    Modified Rankin (Stroke Patients Only)       Balance Overall balance assessment: Needs assistance Sitting-balance support: Feet supported;Single extremity supported;Bilateral upper extremity supported Sitting balance-Leahy Scale: Fair Sitting balance - Comments: sat EOB to donn prosthesis.  Needed stability to keep from falling posteriorly when donning ply socks, sleeve and prosthesis.   Standing balance support: Bilateral upper extremity supported Standing  balance-Leahy Scale: Poor Standing balance comment: stood in RW on R prosthesis with significant assist and generally difficulty getting fully up over the prosthesis.                    Cognition Arousal/Alertness: Awake/alert Behavior During Therapy: WFL for tasks assessed/performed Overall Cognitive Status: Within Functional Limits for tasks assessed                      Exercises      General Comments General comments (skin integrity, edema, etc.): pt's prosthesis is far from a perfect fit, but we did not spend an adequate amount of time fitting the proper number of ply due to just completing one standing and one transfer trial each.      Pertinent Vitals/Pain Pain Assessment: Faces Pain Score: 6  Pain Location: LLE Pain Descriptors / Indicators: Sore;Grimacing Pain Intervention(s): Monitored during session;Premedicated before session    Home Living                      Prior Function            PT Goals (current goals can now be found in the care plan section) Acute Rehab PT Goals Patient Stated Goal: Back independent at home PT Goal Formulation: With patient Time For Goal Achievement: 10/25/15 Potential to Achieve Goals: Good Progress towards PT goals: Progressing toward goals    Frequency  Min 3X/week    PT Plan Current plan remains appropriate  Co-evaluation             End of Session Equipment Utilized During Treatment: Oxygen Activity Tolerance: Patient tolerated treatment well;Patient limited by fatigue Patient left: in chair;with family/visitor present     Time: [id number] PT Time Calculation (min) (ACUTE ONLY): 23 min  Charges:  $Therapeutic Activity: 23-37 mins                    G Codes:      Miamor Ayler, Tessie Fass 10/12/2015, 12:06 PM 10/12/2015  Donnella Sham, PT [phone]  (pager)

## 2015-10-12 NOTE — Progress Notes (Signed)
Whitney called from [place] for report. Preparing to transfer patient to in house rehab.

## 2015-10-12 NOTE — Progress Notes (Signed)
Inpatient Rehabilitation  I have received insurance approval and MD clearance to admit pt. To CIR today.  I have updated Marvetta Gibbons, RNCM and [employer], CSW of the plan.  I will make all necessary arrangements for admission later this evening.  Please call if questions.  [place] Admissions Coordinator Cell [phone] Office [phone]

## 2015-10-12 NOTE — H&P (Signed)
Physical Medicine and Rehabilitation Admission H&P    CC: L-AKA due to PVD/ History of R-BKA   HPI:   Randy [name] is a 82 y.o. 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 Patient elected to undergo L-AKA on 10/10/15 by Dr. Donnetta Hutching. Post op has had bleeding from stump requiring dressing changes. PT/OT evaluations to be done today and CIR consulted in anticipation of extensive rehab needs.   Review of Systems  Constitutional: Negative.   HENT: Negative.   Eyes: Negative.   Respiratory: Negative.   Cardiovascular: Negative.   Gastrointestinal: Positive for constipation.  Genitourinary:       Decreased output related to ESRD  Musculoskeletal:       Left stump pain  Skin: Negative.   Neurological: Negative.   Endo/Heme/Allergies: Negative.   Psychiatric/Behavioral: Negative.       Past Medical History  Diagnosis Date  . Hypercholesterolemia   . Gout   . Nonischemic cardiomyopathy (HCC)     LVEF 35-40%  . Anemia of chronic disease   . Essential hypertension   . Arthritis   . Peripheral vascular disease ([place])     Status post right below knee amputation for a nonhealing wound of the right foot 12/2014  . History of pneumonia 2014  . CAD (coronary artery disease)     Moderate multivessel disease 07/2015 - managed medically  . History of myopericarditis 2015    07/2015  . Stroke [facility])     "they said I did"  "I did not know anything about it"  . ESRD on hemodialysis [facility])     M/W/F in [place] - Dr. Florene Glen  . Constipation   . History of blood transfusion   . Pneumonia   . Acute myopericarditis 2015    Past Surgical History  Procedure Laterality Date  . Knee arthroscopy Right 2007  . Back surgery    . Hemorrhoid surgery  1970's  . Ganglion cyst excision  01/03/2012    Procedure: REMOVAL GANGLION OF WRIST;  Surgeon: Scherry Ran, MD;  Location: [facility];  Service: General;  Laterality: Right;  . Colonoscopy    . Av fistula placement  08/24/2012    Procedure: ARTERIOVENOUS (AV) FISTULA CREATION;  Surgeon: Rosetta Posner, MD;  Location: [place];  Service: Vascular;  Laterality: Left;  . Insertion of dialysis catheter Right      neck  . Insertion of dialysis catheter  10/19/2012    Procedure: INSERTION OF DIALYSIS CATHETER;  Surgeon: Rosetta Posner, MD;  Location: [place];  Service: Vascular;  Laterality: N/A;  REMOVE TEMPORARY CATH  . Cholecystectomy    . Lumbar disc surgery  2004  . Left heart catheterization with coronary angiogram N/A 08/17/2014    Procedure: LEFT HEART CATHETERIZATION WITH CORONARY ANGIOGRAM;  Surgeon: Larey Dresser, MD;  Location: [facility] CATH LAB;  Service: Cardiovascular;  Laterality: N/A;  . Colonoscopy Left 09/26/2014    Procedure: COLONOSCOPY;  Surgeon: Arta Silence, MD;  Location: [facility] ENDOSCOPY;  Service: Endoscopy;  Laterality: Left;  . Amputation Right 01/05/2015    Procedure: AMPUTATION BELOW KNEE;  Surgeon: Angelia Mould, MD;  Location: [place];  Service: Vascular;  Laterality: Right;  . Capd removal N/A 03/13/2015    Procedure: CONTINUOUS AMBULATORY PERITONEAL DIALYSIS  (CAPD) CATHETER REMOVAL;  Surgeon: Coralie Keens, MD;  Location: [place];  Service: General;  Laterality: N/A;  . Cardiac catheterization    . Above knee leg amputation Left 10/10/2015  . Amputation Left 10/10/2015    Procedure: AMPUTATION ABOVE KNEE LEFT;  Surgeon: Angelia Mould, MD;  Location: [facility] OR;  Service: Vascular;  Laterality: Left;    Family History  Problem Relation Age of Onset  . Arthritis    . Cancer    . Kidney disease    . Anesthesia problems Neg Hx   . Hypotension Neg Hx   . Malignant hyperthermia Neg Hx   . Pseudochol deficiency Neg Hx   . Cancer Sister   . Colon cancer Brother   . Colon cancer Brother     Social History:  Married. 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 Wife  at home can provide supervsion after discharge. Niece provides transportation to HD. He reports that he quit smoking about 17 years ago. His smoking use included Cigarettes. He has a 30 pack-year smoking history. He quit smokeless tobacco use about 21 years ago. His smokeless tobacco use included Chew. He reports that he does not drink alcohol or use illicit drugs.    Allergies: No Known Allergies    Medications Prior to Admission  Medication Sig Dispense Refill  . amLODipine (NORVASC) 10 MG tablet Take 1 tablet (10 mg total) by mouth daily. 30 tablet 3  . aspirin 81 MG tablet Take 81 mg by mouth daily.    Marland Kitchen atorvastatin (LIPITOR) 40 MG tablet Take 1 tablet (40 mg total) by mouth daily at 6 PM. 30 tablet 1  . calcitRIOL (ROCALTROL) 0.5 MCG capsule Take 1 capsule (0.5 mcg total) by mouth daily. 30 capsule 3  . carvedilol (COREG) 25 MG tablet Take 1 tablet (25 mg total) by mouth 2 (two) times daily with a meal. 60 tablet 1  . cloNIDine (CATAPRES) 0.2 MG tablet Take 0.2 mg by mouth 2 (two) times daily as needed (blood pressure >130/27).   10  . doxycycline (VIBRA-TABS) 100 MG tablet Take 1 tablet (100 mg total) by mouth every 12 (twelve) hours. 14 tablet 0  . feeding supplement, RESOURCE BREEZE, (RESOURCE BREEZE) LIQD Take 1 Container by mouth 3 (three) times daily between meals. 90 Container 5  . gabapentin (NEURONTIN) 100 MG capsule Take 1 capsule (100 mg total) by mouth 3 (three) times daily. (Patient taking differently: Take 100 mg by mouth at bedtime. ) 60 capsule 0  . hydrALAZINE (APRESOLINE) 25 MG tablet Take 1 tablet (25 mg total) by mouth 3 (three) times daily. (Patient taking differently: Take 25 mg by mouth 3 (three) times daily as needed (High blood pressure). ) 90 tablet 3  . isosorbide mononitrate (IMDUR) 30 MG 24 hr tablet Take 0.5 tablets (15 mg total) by mouth at bedtime. 45 tablet 3  . lisinopril (PRINIVIL,ZESTRIL) 10 MG tablet Take 1 tablet by mouth daily.  2  . LUMIGAN 0.01 %  SOLN Place 1 drop into both eyes at bedtime.  11  . multivitamin (RENA-VIT) TABS tablet Take 1 tablet by mouth daily.    . Oxycodone HCl 10 MG TABS Take 10 mg by mouth every 6 (six) hours as needed (for pain).   0  . oxyCODONE-acetaminophen (PERCOCET/ROXICET) 5-325 MG per tablet Take 1 tablet by mouth every 6 (six) hours as needed for severe pain. 20 tablet 0  . sevelamer carbonate (RENVELA) 800 MG tablet Take 1,600-2,400 mg by mouth See admin instructions. Take 3 tablets (2400 mg) with meals and 2 tablets (1600 mg)  with snacks    . SIMBRINZA 1-0.2 % SUSP Place 1 drop into both eyes 2 (two) times daily.  11  . predniSONE (DELTASONE) 20 MG tablet Take 2 tablets by mouth daily X 2 days; then 1 tablet by mouth daily X 2 days; then 1/2 tablet by mouth daily X 3 days and stop prednisone 9 tablet 0    Home: Home Living Family/patient expects to be discharged to:: Private residence Living Arrangements: Spouse/significant other Available Help at Discharge: Family, Available 24 hours/day Type of Home: House Home Access: Stairs to enter, Ramped entrance Technical brewer of Steps: 2 Entrance Stairs-Rails: None Home Layout: One level Bathroom Shower/Tub: Chiropodist: Handicapped height Bathroom Accessibility: Yes Home Equipment: Environmental consultant - 2 wheels, [employer] - quad, Civil engineer, contracting, Wheelchair - manual, Insurance underwriter History: Prior Function Level of Independence: Needs assistance Gait / Transfers Assistance Needed: pt uses w/c as primary means of mobility, transferred independently to bed/toilet/chair, assisted for tub transfers ADL's / Homemaking Assistance Needed: Wife assists pt to get down into jetted tub and washes pt's back, wife performs all housekeeping and meal prep Comments: Pt does not like to shower.  Functional Status:  Mobility: Bed Mobility Overal bed mobility: Needs Assistance Bed Mobility: Supine to Sit, Sit to Supine, Rolling Rolling: Min  guard Supine to sit: Mod assist Sit to supine: Min guard General bed mobility comments: also extra time to move.  pt managed to maneuver himself around in supine, but had more difficulty using arms to scoot in sitting around the bed. Transfers General transfer comment: not observed      ADL: ADL Overall ADL's : Needs assistance/impaired Eating/Feeding: Independent, Bed level Grooming: Wash/dry hands, Wash/dry face, Minimal assistance, Sitting Upper Body Bathing: Moderate assistance, Sitting Lower Body Bathing: Maximal assistance, Sitting/lateral leans Upper Body Dressing : Minimal assistance, Sitting Lower Body Dressing: Maximal assistance, Sitting/lateral leans General ADL Comments: Pt seen following PT evaluation and having had HD earlier today, fatigued  Cognition: Cognition Overall Cognitive Status: Within Functional Limits for tasks assessed Orientation Level: Oriented X4 Cognition Arousal/Alertness: Awake/alert Behavior During Therapy: WFL for tasks assessed/performed Overall Cognitive Status: Within Functional Limits for tasks assessed    Blood pressure 153/76, pulse 83, temperature 98.1 F (36.7 C), temperature source Oral, resp. rate 17, height _0  (1.753 m), weight 59.9 kg (132 lb 0.9 oz), SpO2 98 %. Physical Exam  Nursing note and vitals reviewed. Constitutional: He is oriented to person, place, and time. He appears well-developed and well-nourished.  HENT:  Head: Normocephalic and atraumatic.  Right Ear: External ear normal.  Left Ear: External ear normal.  Mouth/Throat: Oropharynx is clear and moist.  Eyes: Conjunctivae and EOM are normal. Pupils are equal, round, and reactive to light.  Neck: Normal range of motion. Neck supple. No JVD present.  Cardiovascular: Normal rate and regular rhythm.   Murmur heard. Respiratory: Effort normal and breath sounds normal. No stridor. No respiratory distress. He has no wheezes. He has no rales.  GI: Soft. Bowel sounds  are normal. He exhibits no distension. There is no tenderness. There is no rebound.  Genitourinary: Penis normal.  Musculoskeletal:  Right BKA well healed no edema , non tender  Left AKA with Ace wrap, no seepage through bandage, mild edema, moderate tenderness to palpation  Neurological: He is alert and oriented to person, place, and time.  Skin: Skin is warm.  Psychiatric: He has a normal mood and affect.  5/5 in BUE and R Hip flexor,  LLE has trace hip flexion  Results for orders placed or performed during the hospital encounter of 10/10/15 (from the past 48 hour(s))  Basic metabolic panel     Status: Abnormal   Collection Time: 10/11/15  3:29 AM  Result Value Ref Range   Sodium 138 135 - 145 mmol/L   Potassium 4.9 3.5 - 5.1 mmol/L   Chloride 97 (L) 101 - 111 mmol/L   CO2 26 22 - 32 mmol/L   Glucose, Bld 79 65 - 99 mg/dL   BUN 53 (H) 6 - 20 mg/dL   Creatinine, Ser 9.88 (H) 0.61 - 1.24 mg/dL   Calcium 8.5 (L) 8.9 - 10.3 mg/dL   GFR calc non Af Amer 4 (L) >60 mL/min   GFR calc Af Amer 5 (L) >60 mL/min    Comment: (NOTE) The eGFR has been calculated using the CKD EPI equation. This calculation has not been validated in all clinical situations. eGFR's persistently <60 mL/min signify possible Chronic Kidney Disease.    Anion gap 15 5 - 15  CBC     Status: Abnormal   Collection Time: 10/11/15  3:29 AM  Result Value Ref Range   WBC 8.2 4.0 - 10.5 K/uL   RBC 3.60 (L) 4.22 - 5.81 MIL/uL   Hemoglobin 10.7 (L) 13.0 - 17.0 g/dL   HCT 35.5 (L) 39.0 - 52.0 %   MCV 98.6 78.0 - 100.0 fL   MCH 29.7 26.0 - 34.0 pg   MCHC 30.1 30.0 - 36.0 g/dL   RDW 18.6 (H) 11.5 - 15.5 %   Platelets 242 150 - 400 K/uL  Prealbumin     Status: Abnormal   Collection Time: 10/11/15  8:30 AM  Result Value Ref Range   Prealbumin 17.4 (L) 18 - 38 mg/dL  Phosphorus     Status: None   Collection Time: 10/11/15  8:30 AM  Result Value Ref Range   Phosphorus 3.6 2.5 - 4.6 mg/dL  Renal function panel      Status: Abnormal   Collection Time: 10/11/15  1:30 PM  Result Value Ref Range   Sodium 137 135 - 145 mmol/L   Potassium 3.7 3.5 - 5.1 mmol/L   Chloride 97 (L) 101 - 111 mmol/L   CO2 29 22 - 32 mmol/L   Glucose, Bld 130 (H) 65 - 99 mg/dL   BUN 17 6 - 20 mg/dL   Creatinine, Ser 5.07 (H) 0.61 - 1.24 mg/dL    Comment: DELTA CHECK NOTED   Calcium 9.0 8.9 - 10.3 mg/dL   Phosphorus 4.2 2.5 - 4.6 mg/dL   Albumin 2.5 (L) 3.5 - 5.0 g/dL   GFR calc non Af Amer 10 (L) >60 mL/min   GFR calc Af Amer 11 (L) >60 mL/min    Comment: (NOTE) The eGFR has been calculated using the CKD EPI equation. This calculation has not been validated in all clinical situations. eGFR's persistently <60 mL/min signify possible Chronic Kidney Disease.    Anion gap 11 5 - 15  CBC     Status: Abnormal   Collection Time: 10/11/15  1:30 PM  Result Value Ref Range   WBC 7.8 4.0 - 10.5 K/uL   RBC 4.00 (L) 4.22 - 5.81 MIL/uL   Hemoglobin 11.7 (L) 13.0 - 17.0 g/dL   HCT 39.2 39.0 - 52.0 %   MCV 98.0 78.0 - 100.0 fL   MCH 29.3 26.0 - 34.0 pg   MCHC 29.8 (L) 30.0 - 36.0 g/dL   RDW  18.7 (H) 11.5 - 15.5 %   Platelets 219 150 - 400 K/uL  Glucose, capillary     Status: Abnormal   Collection Time: 10/11/15  4:11 PM  Result Value Ref Range   Glucose-Capillary 183 (H) 65 - 99 mg/dL  Glucose, capillary     Status: Abnormal   Collection Time: 10/11/15  8:57 PM  Result Value Ref Range   Glucose-Capillary 105 (H) 65 - 99 mg/dL   Comment 1 Notify RN    Comment 2 Document in Chart   Basic metabolic panel     Status: Abnormal   Collection Time: 10/12/15  2:50 AM  Result Value Ref Range   Sodium 132 (L) 135 - 145 mmol/L   Potassium 4.7 3.5 - 5.1 mmol/L    Comment: DELTA CHECK NOTED   Chloride 93 (L) 101 - 111 mmol/L   CO2 27 22 - 32 mmol/L   Glucose, Bld 116 (H) 65 - 99 mg/dL   BUN 29 (H) 6 - 20 mg/dL   Creatinine, Ser 6.75 (H) 0.61 - 1.24 mg/dL   Calcium 9.0 8.9 - 10.3 mg/dL   GFR calc non Af Amer 7 (L) >60 mL/min    GFR calc Af Amer 8 (L) >60 mL/min    Comment: (NOTE) The eGFR has been calculated using the CKD EPI equation. This calculation has not been validated in all clinical situations. eGFR's persistently <60 mL/min signify possible Chronic Kidney Disease.    Anion gap 12 5 - 15  CBC     Status: Abnormal   Collection Time: 10/12/15  2:50 AM  Result Value Ref Range   WBC 6.7 4.0 - 10.5 K/uL   RBC 3.52 (L) 4.22 - 5.81 MIL/uL   Hemoglobin 10.2 (L) 13.0 - 17.0 g/dL   HCT 34.3 (L) 39.0 - 52.0 %   MCV 97.4 78.0 - 100.0 fL   MCH 29.0 26.0 - 34.0 pg   MCHC 29.7 (L) 30.0 - 36.0 g/dL   RDW 18.5 (H) 11.5 - 15.5 %   Platelets 192 150 - 400 K/uL  Glucose, capillary     Status: Abnormal   Collection Time: 10/12/15  6:10 AM  Result Value Ref Range   Glucose-Capillary 102 (H) 65 - 99 mg/dL   Comment 1 Notify RN    Comment 2 Document in Chart    No results found.     Medical Problem List and Plan: 1.  Decreased mobility and self care   secondary to Left AKA POD #2 with prior hx of R BKA 2.  DVT Prophylaxis/Anticoagulation: Pharmaceutical: Lovenox 3. Pain Management: Oxycodone IR 4. Mood: monitor for reactive depression 5. Neuropsych: This patient is capable of making decisions on his own behalf. 6. Skin/Wound Care: Dressing changes and ACE wrap to LLE 7. Fluids/Electrolytes/Nutrition: Monitor I/Os, meal intake, may need dietary consult 8.  BXU:XYBFXOV, lisinopril, coreg, with prn hydralazine 9. ESRD:  HD managed by Nephro M-W F 10 Anemia of chronic disease:  CBC in am, Aranesp weekly in HD 11. NISCM/CAD:Lipitor , ASA, Coreg     Post Admission Physician Evaluation: 1. Functional deficits secondary  to secondary to Left AKA 10/10/15 with prior hx of R BKA. 2. Patient is admitted to receive collaborative, interdisciplinary care between the physiatrist, rehab nursing staff, and therapy team. 3. Patient's level of medical complexity and substantial therapy needs in context of that medical  necessity cannot be provided at a lesser intensity of care such as a SNF. 4. Patient has experienced substantial functional  loss from his/her baseline which was documented above under the "Functional History" and "Functional Status" headings.  Judging by the patient's diagnosis, physical exam, and functional history, the patient has potential for functional progress which will result in measurable gains while on inpatient rehab.  These gains will be of substantial and practical use upon discharge  in facilitating mobility and self-care at the household level. 5. Physiatrist will provide 24 hour management of medical needs as well as oversight of the therapy plan/treatment and provide guidance as appropriate regarding the interaction of the two. 6. 24 hour rehab nursing will assist with bowel management, safety, skin/wound care, disease management, medication administration, pain management and patient education  and help integrate therapy concepts, techniques,education, etc. 7. PT will assess and treat for/with: pre gait, gait training, endurance , safety, equipment, neuromuscular re education.   Goals are: Sup. 8. OT will assess and treat for/with: ADLs, Cognitive perceptual skills, Neuromuscular re education, safety, endurance, equipment.   Goals are: Sup. Therapy may not proceed with showering this patient until POD 4, then with LLE covered. 9. SLP will assess and treat for/with: NA.  Goals are: NA. 10. Case Management and Social Worker will assess and treat for psychological issues and discharge planning. 11. Team conference will be held weekly to assess progress toward goals and to determine barriers to discharge. 12. Patient will receive at least 3 hours of therapy per day at least 5 days per week. 13. ELOS: 7-11 Days       14. Prognosis:  excellent     Charlett Blake M.D. [facility] FAAPM&R (Sports Med, Neuromuscular Med) Diplomate Am Board of Electrodiagnostic Med     1/19/2017oxycodone IR, aranesp weekly in HD

## 2015-10-13 ENCOUNTER — Inpatient Hospital Stay (HOSPITAL_COMMUNITY): Payer: Medicare Other

## 2015-10-13 ENCOUNTER — Inpatient Hospital Stay (HOSPITAL_COMMUNITY): Payer: Medicare Other | Admitting: Occupational Therapy

## 2015-10-13 ENCOUNTER — Inpatient Hospital Stay (HOSPITAL_COMMUNITY): Payer: Medicare Other | Admitting: Physical Therapy

## 2015-10-13 DIAGNOSIS — Z89612 Acquired absence of left leg above knee: Secondary | ICD-10-CM

## 2015-10-13 DIAGNOSIS — I10 Essential (primary) hypertension: Secondary | ICD-10-CM

## 2015-10-13 DIAGNOSIS — R269 Unspecified abnormalities of gait and mobility: Secondary | ICD-10-CM

## 2015-10-13 DIAGNOSIS — D62 Acute posthemorrhagic anemia: Secondary | ICD-10-CM

## 2015-10-13 DIAGNOSIS — D638 Anemia in other chronic diseases classified elsewhere: Secondary | ICD-10-CM

## 2015-10-13 LAB — CBC
HCT: 32 % — ABNORMAL LOW (ref 39.0–52.0)
Hemoglobin: 9.8 g/dL — ABNORMAL LOW (ref 13.0–17.0)
MCH: 29.4 pg (ref 26.0–34.0)
MCHC: 30.6 g/dL (ref 30.0–36.0)
MCV: 96.1 fL (ref 78.0–100.0)
Platelets: 206 10*3/uL (ref 150–400)
RBC: 3.33 MIL/uL — ABNORMAL LOW (ref 4.22–5.81)
RDW: 18.3 % — ABNORMAL HIGH (ref 11.5–15.5)
WBC: 6.5 10*3/uL (ref 4.0–10.5)

## 2015-10-13 LAB — RENAL FUNCTION PANEL
Albumin: 2.2 g/dL — ABNORMAL LOW (ref 3.5–5.0)
Anion gap: 17 — ABNORMAL HIGH (ref 5–15)
BUN: 58 mg/dL — ABNORMAL HIGH (ref 6–20)
CO2: 22 mmol/L (ref 22–32)
Calcium: 8.9 mg/dL (ref 8.9–10.3)
Chloride: 92 mmol/L — ABNORMAL LOW (ref 101–111)
Creatinine, Ser: 10.86 mg/dL — ABNORMAL HIGH (ref 0.61–1.24)
GFR calc Af Amer: 4 mL/min — ABNORMAL LOW (ref >60)
GFR calc non Af Amer: 4 mL/min — ABNORMAL LOW (ref >60)
Glucose, Bld: 133 mg/dL — ABNORMAL HIGH (ref 65–99)
Phosphorus: 6.6 mg/dL — ABNORMAL HIGH (ref 2.5–4.6)
Potassium: 4.7 mmol/L (ref 3.5–5.1)
Sodium: 131 mmol/L — ABNORMAL LOW (ref 135–145)

## 2015-10-13 MED ORDER — OXYCODONE HCL 5 MG PO TABS
ORAL_TABLET | ORAL | Status: AC
  Filled 2015-10-13: qty 2

## 2015-10-13 MED ORDER — SODIUM CHLORIDE 0.9 % IV SOLN
125.0000 mg | INTRAVENOUS | Status: AC
  Administered 2015-10-13 – 2015-10-16 (×2): 125 mg via INTRAVENOUS
  Filled 2015-10-13 (×4): qty 10

## 2015-10-13 MED ORDER — RENA-VITE PO TABS
1.0000 | ORAL_TABLET | Freq: Every day | ORAL | Status: DC
  Administered 2015-10-14 – 2015-10-20 (×7): 1 via ORAL
  Filled 2015-10-13 (×7): qty 1

## 2015-10-13 NOTE — Progress Notes (Cosign Needed)
[facility] Individual Statement of Services  Patient Name:  Randy [name]  Date:  10/13/2015  Welcome to the [facility].  Our goal is to provide you with an individualized program based on your diagnosis and situation, designed to meet your specific needs.  With this comprehensive rehabilitation program, you will be expected to participate in at least 3 hours of rehabilitation therapies Monday-Friday, with modified therapy programming on the weekends.  Your rehabilitation program will include the following services:  Physical Therapy (PT), Occupational Therapy (OT), 24 hour per day rehabilitation nursing, Case Management (Social Worker), Rehabilitation Medicine, Nutrition Services and Pharmacy Services  Weekly team conferences will be held on Wednesdays to discuss your progress.  Your Social Worker will talk with you frequently to get your input and to update you on team discussions.  Team conferences with you and your family in attendance may also be held.  Expected length of stay: 10 to 14 days  Overall anticipated outcome: Supervision  Depending on your progress and recovery, your program may change. Your Social Worker will coordinate services and will keep you informed of any changes. Your Social Worker's name and contact numbers are listed  below.  The following services may also be recommended but are not provided by the [facility] will be made to provide these services after discharge if needed.  Arrangements include referral to agencies that provide these services.  Your insurance has been verified to be:  [employer] Your primary doctor is:  Dr. Lucia Gaskins  Pertinent information will be shared with your doctor and your insurance company.  Social Worker:  Alfonse Alpers, LCSW  [phone] or [phone]  Information discussed with and copy given to patient by: Trey Sailors, 10/13/2015, 2:08 PM

## 2015-10-13 NOTE — Procedures (Signed)
[facility] HD treatment;  No hemodynamic issues today.  Tolerating PT. Erling Cruz C

## 2015-10-13 NOTE — Evaluation (Signed)
Occupational Therapy Assessment and Plan  Patient Details  Name: Randy [name] MRN: [id number] Date of Birth: 01-07-1932  OT Diagnosis: acute pain, muscle weakness (generalized) and pain in joint Rehab Potential: Rehab Potential (ACUTE ONLY): Good ggod ELOS: 12-14 days   Today's Date: 10/13/2015 OT Individual Time:  -   1045-1200  (75 min)       Problem List:  Patient Active Problem List   Diagnosis Date Noted  . Unilateral AKA ([place]) 10/12/2015  . Abnormality of gait   . Status post above knee amputation of left lower extremity ([place])   . Chronic combined systolic and diastolic congestive heart failure ([place])   . ESRD on dialysis ([place])   . Essential hypertension   . Post-operative pain   . Peripheral vascular disease with pain at rest [facility]) 10/10/2015  . Chest pain 08/04/2015  . PD catheter dysfunction ([place]) 03/13/2015  . Cellulitis and abscess of foot   . Kidney mass   . Other emphysema ([place])   . Acute blood loss anemia   . Physical deconditioning   . S/P BKA (below knee amputation) unilateral ([place])   . Renal mass 01/01/2015  . Foot pain 12/30/2014  . Foot pain, right 12/30/2014  . CHF (congestive heart failure) ([place]) 12/30/2014  . CAD (coronary artery disease) 12/30/2014  . Hyperkalemia 12/30/2014  . ESRD (end stage renal disease) on dialysis ([place])   . Blood loss anemia   . Lower GI bleed 09/21/2014  . Fluid overload 08/29/2014  . Acute on chronic combined systolic and diastolic CHF (congestive heart failure) ([place]) 08/29/2014  . Acute respiratory failure with hypoxia ([place]) 08/29/2014  . Acute respiratory failure with hypoxemia ([place]) 08/29/2014  . Acute myopericarditis 08/18/2014  . Elevated troponin 08/18/2014  . Anemia of chronic disease   . End stage renal disease, has been peritoneal, now hemodialysis 07/28/2012  . Secondary cardiomyopathy ([place]) 06/26/2012  . HLD (hyperlipidemia) 12/10/2006  . Hypertensive heart disease 08/18/2006  . OSTEOARTHRITIS 08/18/2006   . Polycystic kidney 08/18/2006    Past Medical History:  Past Medical History  Diagnosis Date  . Hypercholesterolemia   . Gout   . Nonischemic cardiomyopathy (HCC)     LVEF 35-40%  . Anemia of chronic disease   . Essential hypertension   . Arthritis   . Peripheral vascular disease ( [place])     Status post right below knee amputation for a nonhealing wound of the right foot 12/2014  . History of pneumonia 2014  . CAD (coronary artery disease)     Moderate multivessel disease 07/2015 - managed medically  . History of myopericarditis 2015    07/2015  . Stroke [facility])     "they said I did"  "I did not know anything about it"  . ESRD on hemodialysis [facility])     M/W/F in [place] - Dr. Florene Glen  . Constipation   . History of blood transfusion   . Pneumonia   . Acute myopericarditis 2015   Past Surgical History:  Past Surgical History  Procedure Laterality Date  . Knee arthroscopy Right 2007  . Back surgery    . Hemorrhoid surgery  1970's  . Ganglion cyst excision  01/03/2012    Procedure: REMOVAL GANGLION OF WRIST;  Surgeon: Scherry Ran, MD;  Location: [facility];  Service: General;  Laterality: Right;  . Colonoscopy    . Av fistula placement  08/24/2012    Procedure: ARTERIOVENOUS (AV) FISTULA CREATION;  Surgeon: Rosetta Posner, MD;  Location: [place];  Service: Vascular;  Laterality: Left;  . Insertion of dialysis catheter Right      neck  . Insertion of dialysis catheter  10/19/2012    Procedure: INSERTION OF DIALYSIS CATHETER;  Surgeon: Rosetta Posner, MD;  Location: [place];  Service: Vascular;  Laterality: N/A;  REMOVE TEMPORARY CATH  . Cholecystectomy    . Lumbar disc surgery  2004  . Left heart catheterization with coronary angiogram N/A 08/17/2014    Procedure: LEFT HEART CATHETERIZATION WITH CORONARY ANGIOGRAM;  Surgeon: Larey Dresser, MD;  Location: [facility] CATH LAB;  Service: Cardiovascular;  Laterality: N/A;  . Colonoscopy Left 09/26/2014    Procedure: COLONOSCOPY;  Surgeon:  Arta Silence, MD;  Location[facility] ENDOSCOPY;  Service: Endoscopy;  Laterality: Left;  . Amputation Right 01/05/2015    Procedure: AMPUTATION BELOW KNEE;  Surgeon: Angelia Mould, MD;  Location: [place];  Service: Vascular;  Laterality: Right;  . Capd removal N/A 03/13/2015    Procedure: CONTINUOUS AMBULATORY PERITONEAL DIALYSIS  (CAPD) CATHETER REMOVAL;  Surgeon: Coralie Keens, MD;  Location: [place];  Service: General;  Laterality: N/A;  . Cardiac catheterization    . Above knee leg amputation Left 10/10/2015  . Amputation Left 10/10/2015    Procedure: AMPUTATION ABOVE KNEE LEFT;  Surgeon: Angelia Mould, MD;  Location: [place];  Service: Vascular;  Laterality: Left;    Assessment & Plan Clinical Impression: Randy [name] is a 82 y.o. 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 Patient elected to undergo L-AKA on 10/10/15 by Dr. Donnetta Hutching. Post op has had bleeding from stump requiring dressing changes  Patient transferred to CIR on 10/12/2015 .    Patient currently requires mod with basic self-care skills secondary to muscle weakness and muscle joint tightness and decreased cardiorespiratoy endurance.  Prior to hospitalization, patient could complete BADL with supervision.  Patient will benefit from skilled intervention to increase independence with basic self-care skills prior to discharge home with care partner.  Anticipate patient will require intermittent supervision and follow up home health.  Skilled Therapeutic Intervention Skilled OT addressed wc mobility, transfers, trunk control, UB strength.  Pt used sliding board to transfer from bed to wc with min assist.  Did not want to shower and elected to bathe at sink.  Practice lateral leans for peri care and clothes.  Difficult for pt to do.  Can benefit from further OT for mobility and strength.     OT Evaluation Precautions/Restrictions  Restrictions Weight Bearing Restrictions: Yes     Pain  4/10  leg   Home Living/Prior Functioning Home Living Family/patient expects to be discharged to:: Private residence Living Arrangements: Spouse/significant other Available Help at Discharge: Family, Available 24 hours/day Type of Home: House Home Access: Stairs to enter, Ramped entrance (Pt. uses ramped entrance) Entrance Stairs-Number of Steps: 2 Home Layout: One level Bathroom Shower/Tub: Tub/shower unit, Other (comment), Walk-in shower (grab bars on side of tub; walk in shower with door) Bathroom Toilet: Handicapped height Bathroom Accessibility: Yes Additional Comments:  (Pt can roll into bathroom with wc)  Lives With: Spouse IADL History Homemaking Responsibilities: Yes Meal Prep Responsibility: Primary (cooks breakfast at [employer]) Laundry Responsibility: No Cleaning Responsibility: No Bill Paying/Finance Responsibility: No Shopping Responsibility: No Homemaking Comments:  (wife does most of home making duties) Current License: 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 Leisure and Hobbies: use to hunt, fish; watches tv; goes  to church prn Prior Function Level of Independence: Requires assistive device for independence  Able to Take Stairs?: No Driving: No ADL   Vision/Perception;  No problems noted  Vision- History Baseline Vision/History: Wears glasses Wears Glasses: Reading only Patient Visual Report: No change from baseline Vision- Assessment Vision Assessment?: No apparent visual deficits  Cognition Overall Cognitive Status: Within Functional Limits for tasks assessed Arousal/Alertness: Awake/alert Orientation Level: Person;Place;Situation Person: Oriented Place: Oriented Situation: Oriented Year: 2017 Month: January Day of Week: Correct Memory: Appears intact Immediate Memory Recall: Sock;Blue;Bed Memory Recall: Sock;Blue;Bed Memory Recall Sock: With Cue Memory  Recall Blue: With Cue Memory Recall Bed:  (unable) Attention: Sustained Sustained Attention: Appears intact Awareness: Appears intact Problem Solving: Appears intact Safety/Judgment: Appears intact Sensation Coordination Gross Motor Movements are Fluid and Coordinated: Yes Fine Motor Movements are Fluid and Coordinated: Yes Finger Nose Finger Test: 12x/20 sec RUE: 12x.20 sec LUE Motor  Motor Motor - Skilled Clinical Observations: decreased control on finger to nose with mild jerking Mobility :  Defer to PT    Trunk/Postural Assessment; decreased segmental movements in hips     Balance  Sitting balance is min assist for dynamic   Extremity/Trunk Assessment RUE Assessment RUE Assessment: Within Functional Limits LUE Assessment LUE Assessment: Within Functional Limits   See Function Navigator for Current Functional Status.   Refer to Care Plan for Long Term Goals  Recommendations for other services: None  Discharge Criteria: Patient will be discharged from OT if patient refuses treatment 3 consecutive times without medical reason, if treatment goals not met, if there is a change in medical status, if patient makes no progress towards goals or if patient is discharged from hospital.  The above assessment, treatment plan, treatment alternatives and goals were discussed and mutually agreed upon: by patient  Lisa Roca 10/15/2015, 5:47 PM

## 2015-10-13 NOTE — Evaluation (Signed)
Physical Therapy Assessment and Plan  Patient Details  Name: Randy [name] MRN: [id number] Date of Birth: 1932-07-31  PT Diagnosis: Difficulty walking, Impaired sensation and Muscle weakness Rehab Potential: Good ELOS: 10-14 days   Today's Date: 10/13/2015 PT Individual Time: 0830-0930 PT Individual Time Calculation (min): 60 min    Problem List:  Patient Active Problem List   Diagnosis Date Noted  . Unilateral AKA ([place]) 10/12/2015  . Abnormality of gait   . Status post above knee amputation of left lower extremity ([place])   . Chronic combined systolic and diastolic congestive heart failure ([place])   . ESRD on dialysis ([place])   . Essential hypertension   . Post-operative pain   . Peripheral vascular disease with pain at rest [facility]) 10/10/2015  . Chest pain 08/04/2015  . PD catheter dysfunction ([place]) 03/13/2015  . Cellulitis and abscess of foot   . Kidney mass   . Other emphysema ([place])   . Acute blood loss anemia   . Physical deconditioning   . S/P BKA (below knee amputation) unilateral ([place])   . Renal mass 01/01/2015  . Foot pain 12/30/2014  . Foot pain, right 12/30/2014  . CHF (congestive heart failure) ([place]) 12/30/2014  . CAD (coronary artery disease) 12/30/2014  . Hyperkalemia 12/30/2014  . ESRD (end stage renal disease) on dialysis ([place])   . Blood loss anemia   . Lower GI bleed 09/21/2014  . Fluid overload 08/29/2014  . Acute on chronic combined systolic and diastolic CHF (congestive heart failure) ([place]) 08/29/2014  . Acute respiratory failure with hypoxia ([place]) 08/29/2014  . Acute respiratory failure with hypoxemia ([place]) 08/29/2014  . Acute myopericarditis 08/18/2014  . Elevated troponin 08/18/2014  . Anemia of chronic disease   . End stage renal disease, has been peritoneal, now hemodialysis 07/28/2012  . Secondary cardiomyopathy ([place]) 06/26/2012  . HLD (hyperlipidemia) 12/10/2006  . Hypertensive heart disease 08/18/2006  . OSTEOARTHRITIS 08/18/2006  . Polycystic  kidney 08/18/2006    Past Medical History:  Past Medical History  Diagnosis Date  . Hypercholesterolemia   . Gout   . Nonischemic cardiomyopathy (HCC)     LVEF 35-40%  . Anemia of chronic disease   . Essential hypertension   . Arthritis   . Peripheral vascular disease ([place])     Status post right below knee amputation for a nonhealing wound of the right foot 12/2014  . History of pneumonia 2014  . CAD (coronary artery disease)     Moderate multivessel disease 07/2015 - managed medically  . History of myopericarditis 2015    07/2015  . Stroke [facility])     "they said I did"  "I did not know anything about it"  . ESRD on hemodialysis [facility])     M/W/F in [place] - Dr. Florene Glen  . Constipation   . History of blood transfusion   . Pneumonia   . Acute myopericarditis 2015   Past Surgical History:  Past Surgical History  Procedure Laterality Date  . Knee arthroscopy Right 2007  . Back surgery    . Hemorrhoid surgery  1970's  . Ganglion cyst excision  01/03/2012    Procedure: REMOVAL GANGLION OF WRIST;  Surgeon: Scherry Ran, MD;  Location: [facility];  Service: General;  Laterality: Right;  . Colonoscopy    . Av fistula placement  08/24/2012    Procedure: ARTERIOVENOUS (AV) FISTULA CREATION;  Surgeon: Rosetta Posner, MD;  Location: [place];  Service: Vascular;  Laterality: Left;  . Insertion of  dialysis catheter Right      neck  . Insertion of dialysis catheter  10/19/2012    Procedure: INSERTION OF DIALYSIS CATHETER;  Surgeon: Rosetta Posner, MD;  Location: [place];  Service: Vascular;  Laterality: N/A;  REMOVE TEMPORARY CATH  . Cholecystectomy    . Lumbar disc surgery  2004  . Left heart catheterization with coronary angiogram N/A 08/17/2014    Procedure: LEFT HEART CATHETERIZATION WITH CORONARY ANGIOGRAM;  Surgeon: Larey Dresser, MD;  Location: [facility] CATH LAB;  Service: Cardiovascular;  Laterality: N/A;  . Colonoscopy Left 09/26/2014    Procedure: COLONOSCOPY;  Surgeon: Arta Silence, MD;   Location: [facility] ENDOSCOPY;  Service: Endoscopy;  Laterality: Left;  . Amputation Right 01/05/2015    Procedure: AMPUTATION BELOW KNEE;  Surgeon: Angelia Mould, MD;  Location: [place];  Service: Vascular;  Laterality: Right;  . Capd removal N/A 03/13/2015    Procedure: CONTINUOUS AMBULATORY PERITONEAL DIALYSIS  (CAPD) CATHETER REMOVAL;  Surgeon: Coralie Keens, MD;  Location: [place];  Service: General;  Laterality: N/A;  . Cardiac catheterization    . Above knee leg amputation Left 10/10/2015  . Amputation Left 10/10/2015    Procedure: AMPUTATION ABOVE KNEE LEFT;  Surgeon: Angelia Mould, MD;  Location: [facility];  Service: Vascular;  Laterality: Left;    Assessment & Plan Clinical Impression: Patient is a 82 y.o. year old male with recent admission to the hospital on 1/17 for Lt AKA.  Patient transferred to CIR on 10/12/2015 .   Patient currently requires max with mobility secondary to muscle weakness and decreased sitting balance, decreased postural control and decreased balance strategies.  Prior to hospitalization, patient was modified independent  with mobility and lived with Spouse in a House home.  Home access is  Ramped entrance.  Patient will benefit from skilled PT intervention to maximize safe functional mobility, minimize fall risk and decrease caregiver burden for planned discharge home with 24 hour supervision.  Anticipate patient will benefit from follow up [place] at discharge.  PT - End of Session Activity Tolerance: Tolerates 30+ min activity with multiple rests Endurance Deficit: Yes PT Assessment Rehab Potential (ACUTE/IP ONLY): Good PT Patient demonstrates impairments in the following area(s): Balance;Endurance;Motor;Pain;Safety PT Transfers Functional Problem(s): Bed Mobility;Bed to Chair;Car PT Locomotion Functional Problem(s): Wheelchair Mobility PT Plan PT Intensity: Minimum of 1-2 x/day ,45 to 90 minutes PT Frequency: 5 out of 7 days PT Duration Estimated Length of  Stay: 10-14 days PT Treatment/Interventions: Ambulation/gait training;Balance/vestibular training;Community reintegration;Functional electrical stimulation;Patient/family education;UE/LE Coordination activities;UE/LE Strength taining/ROM;Stair training;Splinting/orthotics;Pain Buyer, retail;Neuromuscular re-education;Therapeutic Exercise;Wheelchair propulsion/positioning;Therapeutic Activities;Functional mobility training;Discharge planning PT Transfers Anticipated Outcome(s): supervisoin PT Locomotion Anticipated Outcome(s): supervision w/c  PT Recommendation Follow Up Recommendations: Home health PT Patient destination: Home Equipment Recommended: To be determined Equipment Details: pt has manual w/c and scooter  Skilled Therapeutic Intervention Pt performed sitting balance dynamic with max progressing to min A, min A for static balance for functional tasks.  Pt performed posterior scoot transfer to w/c with max A, pt limited by pain.  Pt able to propel w/c 100' x 2 with supervision, cues for safety with brakes.  PT Evaluation Precautions/Restrictions Precautions Precautions: Fall Restrictions LLE Weight Bearing: Non weight bearing Pain Pain Assessment Pain Assessment: 0-10 Pain Score: 5  Pain Type: Acute pain Pain Location: Leg Pain Orientation: Right Pain Descriptors / Indicators: Aching Pain Frequency: Constant Pain Onset: On-going Pain Intervention(s): RN made aware;Repositioned;Rest Home Living/Prior Functioning Home Living Available Help at Discharge: Family Type of Home:  House Home Access: Ramped entrance Home Layout: One level  Lives With: Spouse Prior Function Level of Independence: Requires assistive device for independence  Cognition Overall Cognitive Status: Within Functional Limits for tasks assessed Sensation Sensation Light Touch: Impaired Detail Light Touch Impaired Details: Impaired LLE (hypersensitive to light  touch) Proprioception: Appears Intact Coordination Gross Motor Movements are Fluid and Coordinated: Yes Motor  Motor Motor - Skilled Clinical Observations: generalized weakness  Mobility   Locomotion     Trunk/Postural Assessment  Cervical Assessment Cervical Assessment: Within Functional Limits Thoracic Assessment Thoracic Assessment: Within Functional Limits Lumbar Assessment Lumbar Assessment:  (posterior pelvic tilt) Postural Control Postural Control: Deficits on evaluation Righting Reactions: delayed  Balance Balance Balance Assessed: Yes Static Sitting Balance Static Sitting - Comment/# of Minutes: min A for static sitting Dynamic Sitting Balance Sitting balance - Comments: max A for sitting EOB without prosthesis Extremity Assessment      RLE Assessment RLE Assessment: Within Functional Limits LLE Assessment LLE Assessment:  (grossly 3-/5 limited by pain)   See Function Navigator for Current Functional Status.   Refer to Care Plan for Long Term Goals  Recommendations for other services: None  Discharge Criteria: Patient will be discharged from PT if patient refuses treatment 3 consecutive times without medical reason, if treatment goals not met, if there is a change in medical status, if patient makes no progress towards goals or if patient is discharged from hospital.  The above assessment, treatment plan, treatment alternatives and goals were discussed and mutually agreed upon: by patient  Ramona Ruark 10/13/2015, 11:53 AM

## 2015-10-13 NOTE — IPOC Note (Signed)
Overall Plan of Care [facility]) Patient Details Name: Randy [name] MRN: [id number] DOB: 01/02/1932  Admitting Diagnosis: L AKA OLD R BKA  Hospital Problems: Active Problems:   Unilateral AKA ([place])     Functional Problem List: Nursing Bowel, Endurance, Medication Management, Motor, Pain, Sensory, Skin Integrity, Safety  PT Balance, Endurance, Motor, Pain, Safety  OT Balance, Endurance, Pain, Safety (Pain in left left)  SLP    TR         Basic ADL's: OT Grooming, Bathing, Dressing, Toileting     Advanced  ADL's: OT Simple Meal Preparation     Transfers: PT Bed Mobility, Bed to Chair, Car  OT Toilet, Tub/Shower     Locomotion: PT Wheelchair Mobility     Additional Impairments: OT    SLP        TR      Anticipated Outcomes Item Anticipated Outcome  Self Feeding    Swallowing      Basic self-care  supervision  Toileting  supervision'   Bathroom Transfers supervision  Bowel/Bladder  Mod I  Transfers  supervisoin  Locomotion  supervision w/c   Communication     Cognition     Pain  < 4  Safety/Judgment  Mod I    Therapy Plan: PT Intensity: Minimum of 1-2 x/day ,45 to 90 minutes PT Frequency: 5 out of 7 days PT Duration Estimated Length of Stay: 10-14 days OT Intensity: Minimum of 1-2 x/day, 45 to 90 minutes OT Frequency: 5 out of 7 days OT Duration/Estimated Length of Stay: 12-14 days         Team Interventions: Nursing Interventions Patient/Family Education, Bowel Management, Medication Management, Pain Management, Skin Care/Wound Management  PT interventions Ambulation/gait training, Training and development officer, Community reintegration, Technical sales engineer stimulation, Patient/family education, UE/LE Coordination activities, UE/LE Strength taining/ROM, Stair training, Splinting/orthotics, Pain management, DME/adaptive equipment instruction, Neuromuscular re-education, Therapeutic Exercise, Wheelchair propulsion/positioning, Therapeutic  Activities, Functional mobility training, Discharge planning  OT Interventions Balance/vestibular training, DME/adaptive equipment instruction, Functional electrical stimulation, Functional mobility training, Patient/family education, Self Care/advanced ADL retraining, Therapeutic Activities, Therapeutic Exercise, UE/LE Strength taining/ROM, Wheelchair propulsion/positioning  SLP Interventions    TR Interventions    SW/CM Interventions Discharge Planning, Psychosocial Support, Patient/Family Education    Team Discharge Planning: Destination: PT-Home ,OT- Home , SLP-  Projected Follow-up: PT-Home health PT, OT-  Home health OT, SLP-  Projected Equipment Needs: PT-To be determined, OT-  (Pt has BSC, shower seat, wc, grab bars), SLP-  Equipment Details: PT-pt has manual w/c and scooter, OT-  Patient/family involved in discharge planning: PT- Patient,  OT-Patient, SLP-   MD ELOS: 12 days Medical Rehab Prognosis:  Excellent Assessment: The patient has been admitted for CIR therapies with the diagnosis of left AKA with previous right BKA. The team will be addressing functional mobility, strength, stamina, balance, safety, adaptive techniques and equipment, self-care, bowel and bladder mgt, patient and caregiver education, pre-prosthetic education, coping skills, wound care, pain mgt, DME, w/c use, community reintegration. Goals have been set at supervision with mobility, self-care, ADL's.    Meredith Staggers, MD, FAAPMR      See Team Conference Notes for weekly updates to the plan of care

## 2015-10-13 NOTE — Progress Notes (Signed)
Randy Lorie Phenix, MD Physician Signed Physical Medicine and Rehabilitation Consult Note 10/11/2015 8:19 AM  Related encounter: Admission (Discharged) from 10/10/2015 in [place] [facility]        Physical Medicine and Rehabilitation Consult  Reason for Consult: L-AKA due to PVD and h/o R-BKA Referring Physician: Dr. Donnetta Hutching.   HPI: Randy [name] is a 82 y.o. 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 Patient elected to undergo L-AKA on 10/10/15 by Dr. Donnetta Hutching. Post op has had bleeding from stump requiring dressing changes. PT/OT evaluations to be done today and CIR consulted in anticipation of extensive rehab needs.  Patient was independent at wheelchair level with right prosthesis. He was able to perform stand pivot transfers independently and occasionally able to take a couple of steps. Wife supportive and can provide supervision after discharge.   Review of Systems  HENT: Negative for hearing loss.  Eyes: Negative for blurred vision and double vision.  Respiratory: Positive for shortness of breath (some with activity). Negative for cough.  Cardiovascular: Negative for palpitations.  Gastrointestinal: Positive for heartburn and constipation. Negative for nausea and abdominal pain.  Musculoskeletal: Positive for myalgias. Negative for joint pain and falls.   Spasms with HD  Skin: Positive for itching (occasionally). Negative for rash.  Neurological: Positive for sensory change (still has occasional phantom pain right residual limb). Negative for dizziness, focal weakness and headaches.  Psychiatric/Behavioral: Negative for depression. The patient does not have insomnia.  All other systems reviewed and are negative.    Past Medical History  Diagnosis Date  . Hypercholesterolemia    . Gout   . Nonischemic cardiomyopathy (HCC)     LVEF 35-40%  . Anemia of chronic disease   . Essential hypertension   . Arthritis   . Peripheral vascular disease ([place])     Status post right below knee amputation for a nonhealing wound of the right foot 12/2014  . History of pneumonia 2014  . CAD (coronary artery disease)     Moderate multivessel disease 07/2015 - managed medically  . History of myopericarditis 2015    07/2015  . Stroke [facility]     "they said I did" "I did not know anything about it"  . ESRD on hemodialysis [facility])     M/W/F in [place] - Dr. Florene Glen  . Constipation   . History of blood transfusion   . Pneumonia   . Acute myopericarditis 2015    Past Surgical History  Procedure Laterality Date  . Knee arthroscopy Right 2007  . Back surgery    . Hemorrhoid surgery  1970's  . Ganglion cyst excision  01/03/2012    Procedure: REMOVAL GANGLION OF WRIST; Surgeon: Scherry Ran, MD; Location: [facility]; Service: General; Laterality: Right;  . Colonoscopy    . Av fistula placement  08/24/2012    Procedure: ARTERIOVENOUS (AV) FISTULA CREATION; Surgeon: Rosetta Posner, MD; Location: [place]; Service: Vascular; Laterality: Left;  . Insertion of dialysis catheter Right     neck  . Insertion of dialysis catheter  10/19/2012    Procedure: INSERTION OF DIALYSIS CATHETER; Surgeon: Rosetta Posner, MD; Location: [place]; Service: Vascular; Laterality: N/A; REMOVE TEMPORARY CATH  . Cholecystectomy    . Lumbar disc surgery  2004  . Left heart catheterization with coronary angiogram N/A 08/17/2014  Procedure: LEFT HEART CATHETERIZATION WITH CORONARY ANGIOGRAM; Surgeon: Larey Dresser, MD; Location: [facility] CATH LAB; Service: Cardiovascular; Laterality: N/A;  . Colonoscopy Left 09/26/2014    Procedure: COLONOSCOPY; Surgeon: Arta Silence,  MD; Location: [facility]OSCOPY; Service: Endoscopy; Laterality: Left;  . Amputation Right 01/05/2015    Procedure: AMPUTATION BELOW KNEE; Surgeon: Angelia Mould, MD; Location: [place]; Service: Vascular; Laterality: Right;  . Capd removal N/A 03/13/2015    Procedure: CONTINUOUS AMBULATORY PERITONEAL DIALYSIS (CAPD) CATHETER REMOVAL; Surgeon: Coralie Keens, MD; Location: [place]; Service: General; Laterality: N/A;  . Cardiac catheterization    . Above knee leg amputation Left 10/10/2015    Family History  Problem Relation Age of Onset  . Arthritis    . Cancer    . Kidney disease    . Anesthesia problems Neg Hx   . Hypotension Neg Hx   . Malignant hyperthermia Neg Hx   . Pseudochol deficiency Neg Hx   . Cancer Sister   . Colon cancer Brother   . Colon cancer Brother      Social History: Married. 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 Wife at home can provide supervsion after discharge. Niece provides transportation to HD. He reports that he quit smoking about 17 years ago. His smoking use included Cigarettes. He has a 30 pack-year smoking history. He quit smokeless tobacco use about 21 years ago. His smokeless tobacco use included Chew. He reports that he does not drink alcohol or use illicit drugs.    Allergies: No Known Allergies    Medications Prior to Admission  Medication Sig Dispense Refill  . amLODipine (NORVASC) 10 MG tablet Take 1 tablet (10 mg total) by mouth daily. 30 tablet 3  . aspirin 81 MG tablet Take 81 mg by mouth daily.    Marland Kitchen atorvastatin (LIPITOR) 40 MG tablet Take 1 tablet (40 mg total) by mouth daily at 6 PM. 30 tablet 1  . calcitRIOL (ROCALTROL) 0.5 MCG capsule Take 1 capsule (0.5 mcg total) by mouth daily. 30 capsule 3  . carvedilol (COREG) 25 MG tablet Take 1 tablet (25 mg total) by mouth 2 (two) times daily with a meal. 60 tablet 1  .  cloNIDine (CATAPRES) 0.2 MG tablet Take 0.2 mg by mouth 2 (two) times daily as needed (blood pressure >130/27).   10  . doxycycline (VIBRA-TABS) 100 MG tablet Take 1 tablet (100 mg total) by mouth every 12 (twelve) hours. 14 tablet 0  . feeding supplement, RESOURCE BREEZE, (RESOURCE BREEZE) LIQD Take 1 Container by mouth 3 (three) times daily between meals. 90 Container 5  . gabapentin (NEURONTIN) 100 MG capsule Take 1 capsule (100 mg total) by mouth 3 (three) times daily. (Patient taking differently: Take 100 mg by mouth at bedtime. ) 60 capsule 0  . hydrALAZINE (APRESOLINE) 25 MG tablet Take 1 tablet (25 mg total) by mouth 3 (three) times daily. (Patient taking differently: Take 25 mg by mouth 3 (three) times daily as needed (High blood pressure). ) 90 tablet 3  . isosorbide mononitrate (IMDUR) 30 MG 24 hr tablet Take 0.5 tablets (15 mg total) by mouth at bedtime. 45 tablet 3  . lisinopril (PRINIVIL,ZESTRIL) 10 MG tablet Take 1 tablet by mouth daily.  2  . LUMIGAN 0.01 % SOLN Place 1 drop into both eyes at bedtime.  11  . multivitamin (RENA-VIT) TABS tablet Take 1 tablet by mouth daily.    . Oxycodone HCl 10 MG TABS Take 10 mg by mouth every 6 (  six) hours as needed (for pain).   0  . oxyCODONE-acetaminophen (PERCOCET/ROXICET) 5-325 MG per tablet Take 1 tablet by mouth every 6 (six) hours as needed for severe pain. 20 tablet 0  . sevelamer carbonate (RENVELA) 800 MG tablet Take 1,600-2,400 mg by mouth See admin instructions. Take 3 tablets (2400 mg) with meals and 2 tablets (1600 mg) with snacks    . SIMBRINZA 1-0.2 % SUSP Place 1 drop into both eyes 2 (two) times daily.  11  . predniSONE (DELTASONE) 20 MG tablet Take 2 tablets by mouth daily X 2 days; then 1 tablet by mouth daily X 2 days; then 1/2 tablet by mouth daily X 3 days and stop prednisone 9 tablet 0    Home: Home Living Family/patient expects to be discharged to::  Unsure Living Arrangements: Spouse/significant other  Functional History:   Functional Status:  Mobility:          ADL:    Cognition: Cognition Orientation Level: Oriented X4      Blood pressure 128/65, pulse 79, temperature 98.8 F (37.1 C), temperature source Oral, resp. rate 15, height 5\' 9"  (1.753 m), weight 59.5 kg (131 lb 2.8 oz), SpO2 100 %. Physical Exam  Nursing note and vitals reviewed. Constitutional: He is oriented to person, place, and time. He appears well-developed and well-nourished. Nasal cannula in place.  Pleasant elderly male in bed undergoing HD.  HENT:  Head: Normocephalic and atraumatic.  Eyes: Conjunctivae and EOM are normal. Pupils are equal, round, and reactive to light.  Neck: Normal range of motion. Neck supple.  Cardiovascular: Normal rate and regular rhythm.  Murmur heard. Respiratory: Effort normal and breath sounds normal. No respiratory distress. He has no wheezes.  GI: Soft. Bowel sounds are normal. He exhibits no distension.  Musculoskeletal: He exhibits tenderness. He exhibits no edema.  Right BKA incision well healed and intact.  Dry scab from healed ulcer on right thigh.  L-AKA with compressive dressing--clean and dry. Minimally tender to touch.  Neurological: He is alert and oriented to person, place, and time.  Speech clear.  Follows basic commands without difficulty.  B/l UE 5/5 Right hip flexion 5/5 Left hip flexion with pain inhibition  Skin: Skin is warm and dry. No rash noted. No erythema.  Left AKA with dressing c/d/i  Psychiatric: He has a normal mood and affect. His behavior is normal. Thought content normal.     Lab Results Last 24 Hours    Results for orders placed or performed during the hospital encounter of 10/10/15 (from the past 24 hour(s))  Basic metabolic panel Status: Abnormal   Collection Time: 10/11/15 3:29 AM  Result Value Ref Range   Sodium 138 135 - 145 mmol/L    Potassium 4.9 3.5 - 5.1 mmol/L   Chloride 97 (L) 101 - 111 mmol/L   CO2 26 22 - 32 mmol/L   Glucose, Bld 79 65 - 99 mg/dL   BUN 53 (H) 6 - 20 mg/dL   Creatinine, Ser 9.88 (H) 0.61 - 1.24 mg/dL   Calcium 8.5 (L) 8.9 - 10.3 mg/dL   GFR calc non Af Amer 4 (L) >60 mL/min   GFR calc Af Amer 5 (L) >60 mL/min   Anion gap 15 5 - 15  CBC Status: Abnormal   Collection Time: 10/11/15 3:29 AM  Result Value Ref Range   WBC 8.2 4.0 - 10.5 K/uL   RBC 3.60 (L) 4.22 - 5.81 MIL/uL   Hemoglobin 10.7 (L) 13.0 - 17.0 g/dL  HCT 35.5 (L) 39.0 - 52.0 %   MCV 98.6 78.0 - 100.0 fL   MCH 29.7 26.0 - 34.0 pg   MCHC 30.1 30.0 - 36.0 g/dL   RDW 18.6 (H) 11.5 - 15.5 %   Platelets 242 150 - 400 K/uL      Imaging Results (Last 48 hours)    No results found.    Assessment/Plan: Diagnosis: L-AKA due to PVD and h/o R-BKA Labs and images independently reviewed. Records reviewed and summated above. PT/OT for mobility, ADL's, strengthening,and wheelchair training Clean amputation daily with soap and water Monitor incision site for signs of infection or impending skin breakdown. Staples to remain in place for 3-4 weeks Stump shrinker, for edema control  Scar mobilization massaging to prevent soft tissue adherence Stump protector during therapies Prevent flexion contractures by implementing the following:  Encourage prone lying for 20-30 mins per day BID to avoid hip flexion Contractures if medically appropriate; Avoid prolonged sitting Post surgical pain control with oral medication Phantom limb pain control with physical modalities including desensitization techniques (gentle self massage to the residual stump,hot packs if sensation iintact, [place]) and mirror therapy, TENS. If ineffective, consider pharmacological treatment for neuropathic pain (e.g gabapentin, pregabalin, amytriptalyine,  duloxetine).   1. Does the need for close, 24 hr/day medical supervision in concert with the patient's rehab needs make it unreasonable for this patient to be served in a less intensive setting? Yes 2. Co-Morbidities requiring supervision/potential complications: NICM with combined chronic systolic/diastolic HF (Monitor in accordance with increased physical activity and avoid UE resistance excercises) PVD (cont meds), ESRD (cont recs per [facility]), hx of R-BKA 12/2014 with diffuse atherosclerosis LLE, HTN (monitor and provide prns in accordance with increased physical exertion and pain), anemia of chronic disease (transfuse if necessary to ensure appropriate perfusion for increased activity tolerance), post-op pain (Biofeedback training with therapies to help reduce reliance on opiate pain medications, monitor pain control during therapies, and sedation at rest and titrate to maximum efficacy to ensure participation and gains in therapies) 3. Due to bladder management, safety, skin/wound care, disease management, medication administration, pain management and patient education, does the patient require 24 hr/day rehab nursing? Yes 4. Does the patient require coordinated care of a physician, rehab nurse, PT (1-2 hrs/day, 5 days/week) and OT (1-2 hrs/day, 85 days/week) to address physical and functional deficits in the context of the above medical diagnosis(es)? Yes Addressing deficits in the following areas: balance, endurance, locomotion, strength, transferring, bowel/bladder control, bathing, dressing, toileting and psychosocial support 5. Can the patient actively participate in an intensive therapy program of at least 3 hrs of therapy per day at least 5 days per week? Potentially 6. The potential for patient to make measurable gains while on inpatient rehab is excellent 7. Anticipated functional outcomes upon discharge from inpatient rehab are TBD with PT, TBD with OT, N/A with SLP. 8. Estimated rehab  length of stay to reach the above functional goals is: TBD. 9. Does the patient have adequate social supports and living environment to accommodate these discharge functional goals? Yes 10. Anticipated D/C setting: Home 11. Anticipated post D/C treatments: HH therapy and Home excercise program 12. Overall Rehab/Functional Prognosis: good  RECOMMENDATIONS: This patient's condition is appropriate for continued rehabilitative care in the following setting: CIR if pt willing and able to tolerate 3 hours therapy/day without IV pain meds. Patient has agreed to participate in recommended program. Potentially Note that insurance prior authorization may be required for reimbursement for recommended care.  Comment:  Rehab Admissions Coordinator to follow up.  Delice Lesch, MD 10/11/2015       Revision History     Date/Time User Provider Type Action   10/11/2015 2:50 PM Randy Lorie Phenix, MD Physician Sign   10/11/2015 8:53 AM Bary Leriche, PA-C Physician Assistant Share   10/11/2015 8:46 AM Bary Leriche, PA-C Physician Assistant Share   View Details Report       Routing History     Date/Time From To Method   10/11/2015 2:50 PM Randy Lorie Phenix, MD Randy Lorie Phenix, MD In [facility]   10/11/2015 2:50 PM Randy Lorie Phenix, MD Lucia Gaskins, MD Fax

## 2015-10-13 NOTE — Care Management Note (Signed)
Case Management Note  Patient Details  Name: Randy [name] MRN: [id number] Date of Birth: [birth year]  Subjective/Objective:   Pt is s/p leg amputation         Action/Plan:  Pt was from home, hx of leg amputation.   Expected Discharge Date:                  Expected Discharge Plan:  IP Rehab Facility  In-House Referral:  Clinical Social Work  Discharge planning Services  CM Consult  Post Acute Care Choice:    Choice offered to:     DME Arranged:    DME Agency:     HH Arranged:    [place] Agency:     Status of Service:  Completed, signed off  Medicare Important Message Given:    Date Medicare IM Given:    Medicare IM give by:    Date Additional Medicare IM Given:    Additional Medicare Important Message give by:     If discussed at [place] of Stay Meetings, dates discussed:    Additional Comments: Pt discharged to CIR 10/12/15 Maryclare Labrador, RN 10/13/2015, 9:23 AM

## 2015-10-13 NOTE — Progress Notes (Signed)
Patient information reviewed and entered into eRehab system by Monta Maiorana, RN, CRRN, PPS Coordinator.  Information including medical coding and functional independence measure will be reviewed and updated through discharge.     Per nursing patient was given "Data Collection Information Summary for Patients in Inpatient Rehabilitation Facilities with attached "Privacy Act Statement-Health Care Records" upon admission.  

## 2015-10-13 NOTE — Progress Notes (Signed)
   VASCULAR SURGERY ASSESSMENT & PLAN:  * POD # 3 L AKA  * L AKA is healing nicely. Will check back on Monday.    SUBJECTIVE: Pain well controlled.   PHYSICAL EXAM: Filed Vitals:   10/12/15 1808 10/13/15 0545  BP: 156/84 137/63  Pulse: 83 84  Temp: 98.8 F (37.1 C) 99.2 F (37.3 C)  TempSrc: Oral Oral  Resp: 18 18  Height: 5\' 9"  (1.753 m)   Weight: 134 lb 0.6 oz (60.8 kg)   SpO2: 97% 98%   L AKA inspected and looks good. No drainage or erythema.   LABS: Lab Results  Component Value Date   WBC 6.7 10/12/2015   HGB 10.2* 10/12/2015   HCT 34.3* 10/12/2015   MCV 97.4 10/12/2015   PLT 192 10/12/2015   Lab Results  Component Value Date   CREATININE 6.75* 10/12/2015   CBG (last 3)   Recent Labs  10/12/15 0610 10/12/15 1057 10/12/15 1611  GLUCAP 102* 133* 111*    Active Problems:   Unilateral AKA ([place])  [name] Beeper: [id number] 10/13/2015

## 2015-10-13 NOTE — Interval H&P Note (Signed)
Randy [name] was admitted today to Inpatient Rehabilitation with the diagnosis of L-AKA due to PVD/ History of R-BKA.  [name] history has been reviewed, patient examined, and there is no change in status.  Patient continues to be appropriate for intensive inpatient rehabilitation.  I have reviewed the patient's chart and labs.  Questions were answered to the patient's satisfaction. [name] has been reviewed and assessment remains appropriate.  Shavawn Stobaugh Lorie Phenix 10/13/2015, 9:16 AM

## 2015-10-13 NOTE — Progress Notes (Signed)
Social Work Assessment and Plan  Patient Details  Name: Randy [name] MRN: [id number] Date of Birth: [birth year]  Today's Date: 10/13/2015  Problem List:  Patient Active Problem List   Diagnosis Date Noted  . Unilateral AKA ([place]) 10/12/2015  . Abnormality of gait   . Status post above knee amputation of left lower extremity ([place])   . Chronic combined systolic and diastolic congestive heart failure ([place])   . ESRD on dialysis ([place])   . Essential hypertension   . Post-operative pain   . Peripheral vascular disease with pain at rest [facility]) 10/10/2015  . Chest pain 08/04/2015  . PD catheter dysfunction ([place]) 03/13/2015  . Cellulitis and abscess of foot   . Kidney mass   . Other emphysema ([place])   . Acute blood loss anemia   . Physical deconditioning   . S/P BKA (below knee amputation) unilateral ([place])   . Renal mass 01/01/2015  . Foot pain 12/30/2014  . Foot pain, right 12/30/2014  . CHF (congestive heart failure) ([place]) 12/30/2014  . CAD (coronary artery disease) 12/30/2014  . Hyperkalemia 12/30/2014  . ESRD (end stage renal disease) on dialysis ([place])   . Blood loss anemia   . Lower GI bleed 09/21/2014  . Fluid overload 08/29/2014  . Acute on chronic combined systolic and diastolic CHF (congestive heart failure) ([place]) 08/29/2014  . Acute respiratory failure with hypoxia ([place]) 08/29/2014  . Acute respiratory failure with hypoxemia ([place]) 08/29/2014  . Acute myopericarditis 08/18/2014  . Elevated troponin 08/18/2014  . Anemia of chronic disease   . End stage renal disease, has been peritoneal, now hemodialysis 07/28/2012  . Secondary cardiomyopathy ([place]) 06/26/2012  . HLD (hyperlipidemia) 12/10/2006  . Hypertensive heart disease 08/18/2006  . OSTEOARTHRITIS 08/18/2006  . Polycystic kidney 08/18/2006   Past Medical History:  Past Medical History  Diagnosis Date  . Hypercholesterolemia   . Gout   . Nonischemic cardiomyopathy (HCC)     LVEF 35-40%  . Anemia of chronic  disease   . Essential hypertension   . Arthritis   . Peripheral vascular disease ([place])     Status post right below knee amputation for a nonhealing wound of the right foot 12/2014  . History of pneumonia 2014  . CAD (coronary artery disease)     Moderate multivessel disease 07/2015 - managed medically  . History of myopericarditis 2015    07/2015  . Stroke [facility])     "they said I did"  "I did not know anything about it"  . ESRD on hemodialysis [facility])     M/W/F in [place] - Dr. Florene Glen  . Constipation   . History of blood transfusion   . Pneumonia   . Acute myopericarditis 2015   Past Surgical History:  Past Surgical History  Procedure Laterality Date  . Knee arthroscopy Right 2007  . Back surgery    . Hemorrhoid surgery  1970's  . Ganglion cyst excision  01/03/2012    Procedure: REMOVAL GANGLION OF WRIST;  Surgeon: Scherry Ran, MD;  Location: [facility];  Service: General;  Laterality: Right;  . Colonoscopy    . Av fistula placement  08/24/2012    Procedure: ARTERIOVENOUS (AV) FISTULA CREATION;  Surgeon: Rosetta Posner, MD;  Location: [facility];  Service: Vascular;  Laterality: Left;  . Insertion of dialysis catheter Right      neck  . Insertion of dialysis catheter  10/19/2012    Procedure: INSERTION OF DIALYSIS CATHETER;  Surgeon: Rosetta Posner, MD;  Location: [facility] OR;  Service: Vascular;  Laterality: N/A;  REMOVE TEMPORARY CATH  . Cholecystectomy    . Lumbar disc surgery  2004  . Left heart catheterization with coronary angiogram N/A 08/17/2014    Procedure: LEFT HEART CATHETERIZATION WITH CORONARY ANGIOGRAM;  Surgeon: Larey Dresser, MD;  Location: [facility] CATH LAB;  Service: Cardiovascular;  Laterality: N/A;  . Colonoscopy Left 09/26/2014    Procedure: COLONOSCOPY;  Surgeon: Arta Silence, MD;  Location: [facility] ENDOSCOPY;  Service: Endoscopy;  Laterality: Left;  . Amputation Right 01/05/2015    Procedure: AMPUTATION BELOW KNEE;  Surgeon: Angelia Mould, MD;  Location: [facility];   Service: Vascular;  Laterality: Right;  . Capd removal N/A 03/13/2015    Procedure: CONTINUOUS AMBULATORY PERITONEAL DIALYSIS  (CAPD) CATHETER REMOVAL;  Surgeon: Coralie Keens, MD;  Location: [place];  Service: General;  Laterality: N/A;  . Cardiac catheterization    . Above knee leg amputation Left 10/10/2015  . Amputation Left 10/10/2015    Procedure: AMPUTATION ABOVE KNEE LEFT;  Surgeon: Angelia Mould, MD;  Location: [place];  Service: Vascular;  Laterality: Left;   Social History:  reports that he quit smoking about 17 years ago. His smoking use included Cigarettes. He has a 30 pack-year smoking history. He quit smokeless tobacco use about 21 years ago. His smokeless tobacco use included Chew. He reports that he does not drink alcohol or use illicit drugs.  Family / Support Systems Marital Status: Married How Long?: 35 years Patient Roles: Spouse, Parent Spouse/Significant Other: Jasmin Trumbull - wife [phone] (h) [phone] (m) Children: Einar Nolasco - dtr in [place] [phone]; Florence Canner - dtr in [place] [phone]; Dorr Perrot - son in [place] [phone] Anticipated Caregiver: wife Khyrin Trevathan and niece Arius Harnois who assists PRN.  Pt said that Ivin Booty would help too, as would other children when they are not working. Ability/Limitations of Caregiver: Oris Drone reports she drives and walks short distances without assist; needs transport chair she visits pt. in the hospital Caregiver Availability: 24/7 Family Dynamics: supportive family per pt   Social History Preferred language: English Religion: Baptist Education: high school Read: Yes Write: Yes Employment Status: Retired Date Retired/Disabled/Unemployed: 1995 Age Retired: 55 Public relations account executive Issues: none reported Guardian/Conservator: N/A - MD has determined that pt is able to make his own decisions.   Abuse/Neglect Physical Abuse: Denies Verbal Abuse: Denies Sexual Abuse:  Denies Exploitation of patient/patient's resources: Denies Self-Neglect: Denies  Emotional Status Pt's affect, behavior and adjustment status: Pt was in good spirits and is motivated to begin his rehab.  He is appreciative of the opportunity to come to CIR. Recent Psychosocial Issues: none reported Psychiatric History: none reported Substance Abuse History: none reported  Patient / Family Perceptions, Expectations & Goals Pt/Family understanding of illness & functional limitations: Pt expressed a good understanding of his condition and has been through an amputation before.  He reports he knows what to expect more this time.  His pain was being managed well, at the time of CSW visit, by his medications.  Pt did report feeling sleepy. Premorbid pt/family roles/activities: Pt used to enjoy hunting and fishing and fixing things around the house.  He does watch TV, especially since he can't do a lot of the other activities he was doing before. Anticipated changes in roles/activities/participation: Pt hopes to still help with chores inside the home.  He was able to help his wife with those PTA. Pt/family expectations/goals: Pt wants to go home as  soon as he is ready, but wants to regain some independence while he is here.  Community [employer] Agencies: None Premorbid Home Care/DME Agencies: Other (Comment) (had [facility] in the past.  Not sure where DME came from.)  Has a rolling walker, w/c, bedside commode, and shower chair. Transportation available at discharge: family Resource referrals recommended: Support group (specify)  Discharge Planning Living Arrangements: Spouse/significant other Support Systems: Spouse/significant other, Children, Other relatives, Friends/neighbors, Home care staff Type of Residence: Private residence Insurance Resources: Multimedia programmer (specify) Forensic psychologist Medicare) Financial Resources: Radio broadcast assistant Screen Referred: No Money Management:  Patient, Spouse Does the patient have any problems obtaining your medications?: No Home Management: Pt and wife were taking care of home together.  Pt feels they can manage and his children will help when they are not working. Patient/Family Preliminary Plans: Pt plans to return home with his wife and niece to provide 24/7 supervision. Social Work Anticipated Follow Up Needs: HH/OP, Support Group Expected length of stay: 10 to 14 days  Clinical Impression CSW met with pt to introduce self and role of CSW, as well as to complete assessment.  CSW also tried to call pt's wife to do the same but she did not answer.  CSW will continue to reach out to her.  Pt was appreciative of CSW visit and he is also pleased to be on CIR.  Pt went to [facility] after his first amputation and would have gone back there if he needed to, but was happy to be on Rehab and get more rehabilitation.  Pt goes to dialysis Monday, Wednesday, and Friday and family has been taking him.  He is wondering if he will need the [employer] to transport him.  CSW told him we could work on that if he feels he cannot get in and out of family vehicle.  Pt worries his prosthesis is not fitting as well as it was or could and wonders if it's because he lost weight.  CSW told him that therapists could look at that with him as he said it is rubbing some.  Pt had [facility] for home nursing and therapies in the past and would like to use them again, if he needs them.  CSW explained that CSW would help with that closer to d/c.  Pt was in good spirits and motivated to work.  CSW will continue to follow and assist as needed.  Benjamin Merrihew, Silvestre Mesi 10/13/2015, 2:00 PM

## 2015-10-13 NOTE — H&P (View-Only) (Signed)
Physical Medicine and Rehabilitation Admission H&P    CC: L-AKA due to PVD/ History of R-BKA   HPI:   Randy [name] is a 82 y.o. 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 Patient elected to undergo L-AKA on 10/10/15 by Dr. Donnetta Hutching. Post op has had bleeding from stump requiring dressing changes. PT/OT evaluations to be done today and CIR consulted in anticipation of extensive rehab needs.   Review of Systems  Constitutional: Negative.   HENT: Negative.   Eyes: Negative.   Respiratory: Negative.   Cardiovascular: Negative.   Gastrointestinal: Positive for constipation.  Genitourinary:       Decreased output related to ESRD  Musculoskeletal:       Left stump pain  Skin: Negative.   Neurological: Negative.   Endo/Heme/Allergies: Negative.   Psychiatric/Behavioral: Negative.       Past Medical History  Diagnosis Date  . Hypercholesterolemia   . Gout   . Nonischemic cardiomyopathy (HCC)     LVEF 35-40%  . Anemia of chronic disease   . Essential hypertension   . Arthritis   . Peripheral vascular disease ([place])     Status post right below knee amputation for a nonhealing wound of the right foot 12/2014  . History of pneumonia 2014  . CAD (coronary artery disease)     Moderate multivessel disease 07/2015 - managed medically  . History of myopericarditis 2015    07/2015  . Stroke [facility])     "they said I did"  "I did not know anything about it"  . ESRD on hemodialysis [facility])     M/W/F in [place] - Dr. Florene Glen  . Constipation   . History of blood transfusion   . Pneumonia   . Acute myopericarditis 2015    Past Surgical History  Procedure Laterality Date  . Knee arthroscopy Right 2007  . Back surgery    . Hemorrhoid surgery  1970's  . Ganglion cyst excision  01/03/2012    Procedure: REMOVAL GANGLION OF WRIST;  Surgeon: Scherry Ran, MD;  Location: [facility];  Service: General;  Laterality: Right;  . Colonoscopy    . Av fistula placement  08/24/2012    Procedure: ARTERIOVENOUS (AV) FISTULA CREATION;  Surgeon: Rosetta Posner, MD;  Location: [place];  Service: Vascular;  Laterality: Left;  . Insertion of dialysis catheter Right      neck  . Insertion of dialysis catheter  10/19/2012    Procedure: INSERTION OF DIALYSIS CATHETER;  Surgeon: Rosetta Posner, MD;  Location: [place];  Service: Vascular;  Laterality: N/A;  REMOVE TEMPORARY CATH  . Cholecystectomy    . Lumbar disc surgery  2004  . Left heart catheterization with coronary angiogram N/A 08/17/2014    Procedure: LEFT HEART CATHETERIZATION WITH CORONARY ANGIOGRAM;  Surgeon: Larey Dresser, MD;  Location: [facility] CATH LAB;  Service: Cardiovascular;  Laterality: N/A;  . Colonoscopy Left 09/26/2014    Procedure: COLONOSCOPY;  Surgeon: Arta Silence, MD;  Location: [facility] ENDOSCOPY;  Service: Endoscopy;  Laterality: Left;  . Amputation Right 01/05/2015    Procedure: AMPUTATION BELOW KNEE;  Surgeon: Angelia Mould, MD;  Location: [place];  Service: Vascular;  Laterality: Right;  . Capd removal N/A 03/13/2015    Procedure: CONTINUOUS AMBULATORY PERITONEAL DIALYSIS  (CAPD) CATHETER REMOVAL;  Surgeon: Coralie Keens, MD;  Location: [place];  Service: General;  Laterality: N/A;  . Cardiac catheterization    . Above knee leg amputation Left 10/10/2015  . Amputation Left 10/10/2015    Procedure: AMPUTATION ABOVE KNEE LEFT;  Surgeon: Angelia Mould, MD;  Location: [facility] OR;  Service: Vascular;  Laterality: Left;    Family History  Problem Relation Age of Onset  . Arthritis    . Cancer    . Kidney disease    . Anesthesia problems Neg Hx   . Hypotension Neg Hx   . Malignant hyperthermia Neg Hx   . Pseudochol deficiency Neg Hx   . Cancer Sister   . Colon cancer Brother   . Colon cancer Brother     Social History:  Married. 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 Wife  at home can provide supervsion after discharge. Niece provides transportation to HD. He reports that he quit smoking about 17 years ago. His smoking use included Cigarettes. He has a 30 pack-year smoking history. He quit smokeless tobacco use about 21 years ago. His smokeless tobacco use included Chew. He reports that he does not drink alcohol or use illicit drugs.    Allergies: No Known Allergies    Medications Prior to Admission  Medication Sig Dispense Refill  . amLODipine (NORVASC) 10 MG tablet Take 1 tablet (10 mg total) by mouth daily. 30 tablet 3  . aspirin 81 MG tablet Take 81 mg by mouth daily.    Marland Kitchen atorvastatin (LIPITOR) 40 MG tablet Take 1 tablet (40 mg total) by mouth daily at 6 PM. 30 tablet 1  . calcitRIOL (ROCALTROL) 0.5 MCG capsule Take 1 capsule (0.5 mcg total) by mouth daily. 30 capsule 3  . carvedilol (COREG) 25 MG tablet Take 1 tablet (25 mg total) by mouth 2 (two) times daily with a meal. 60 tablet 1  . cloNIDine (CATAPRES) 0.2 MG tablet Take 0.2 mg by mouth 2 (two) times daily as needed (blood pressure >130/27).   10  . doxycycline (VIBRA-TABS) 100 MG tablet Take 1 tablet (100 mg total) by mouth every 12 (twelve) hours. 14 tablet 0  . feeding supplement, RESOURCE BREEZE, (RESOURCE BREEZE) LIQD Take 1 Container by mouth 3 (three) times daily between meals. 90 Container 5  . gabapentin (NEURONTIN) 100 MG capsule Take 1 capsule (100 mg total) by mouth 3 (three) times daily. (Patient taking differently: Take 100 mg by mouth at bedtime. ) 60 capsule 0  . hydrALAZINE (APRESOLINE) 25 MG tablet Take 1 tablet (25 mg total) by mouth 3 (three) times daily. (Patient taking differently: Take 25 mg by mouth 3 (three) times daily as needed (High blood pressure). ) 90 tablet 3  . isosorbide mononitrate (IMDUR) 30 MG 24 hr tablet Take 0.5 tablets (15 mg total) by mouth at bedtime. 45 tablet 3  . lisinopril (PRINIVIL,ZESTRIL) 10 MG tablet Take 1 tablet by mouth daily.  2  . LUMIGAN 0.01 %  SOLN Place 1 drop into both eyes at bedtime.  11  . multivitamin (RENA-VIT) TABS tablet Take 1 tablet by mouth daily.    . Oxycodone HCl 10 MG TABS Take 10 mg by mouth every 6 (six) hours as needed (for pain).   0  . oxyCODONE-acetaminophen (PERCOCET/ROXICET) 5-325 MG per tablet Take 1 tablet by mouth every 6 (six) hours as needed for severe pain. 20 tablet 0  . sevelamer carbonate (RENVELA) 800 MG tablet Take 1,600-2,400 mg by mouth See admin instructions. Take 3 tablets (2400 mg) with meals and 2 tablets (1600 mg)  with snacks    . SIMBRINZA 1-0.2 % SUSP Place 1 drop into both eyes 2 (two) times daily.  11  . predniSONE (DELTASONE) 20 MG tablet Take 2 tablets by mouth daily X 2 days; then 1 tablet by mouth daily X 2 days; then 1/2 tablet by mouth daily X 3 days and stop prednisone 9 tablet 0    Home: Home Living Family/patient expects to be discharged to:: Private residence Living Arrangements: Spouse/significant other Available Help at Discharge: Family, Available 24 hours/day Type of Home: House Home Access: Stairs to enter, Ramped entrance Technical brewer of Steps: 2 Entrance Stairs-Rails: None Home Layout: One level Bathroom Shower/Tub: Chiropodist: Handicapped height Bathroom Accessibility: Yes Home Equipment: Environmental consultant - 2 wheels, [employer] - quad, Civil engineer, contracting, Wheelchair - manual, Insurance underwriter History: Prior Function Level of Independence: Needs assistance Gait / Transfers Assistance Needed: pt uses w/c as primary means of mobility, transferred independently to bed/toilet/chair, assisted for tub transfers ADL's / Homemaking Assistance Needed: Wife assists pt to get down into jetted tub and washes pt's back, wife performs all housekeeping and meal prep Comments: Pt does not like to shower.  Functional Status:  Mobility: Bed Mobility Overal bed mobility: Needs Assistance Bed Mobility: Supine to Sit, Sit to Supine, Rolling Rolling: Min  guard Supine to sit: Mod assist Sit to supine: Min guard General bed mobility comments: also extra time to move.  pt managed to maneuver himself around in supine, but had more difficulty using arms to scoot in sitting around the bed. Transfers General transfer comment: not observed      ADL: ADL Overall ADL's : Needs assistance/impaired Eating/Feeding: Independent, Bed level Grooming: Wash/dry hands, Wash/dry face, Minimal assistance, Sitting Upper Body Bathing: Moderate assistance, Sitting Lower Body Bathing: Maximal assistance, Sitting/lateral leans Upper Body Dressing : Minimal assistance, Sitting Lower Body Dressing: Maximal assistance, Sitting/lateral leans General ADL Comments: Pt seen following PT evaluation and having had HD earlier today, fatigued  Cognition: Cognition Overall Cognitive Status: Within Functional Limits for tasks assessed Orientation Level: Oriented X4 Cognition Arousal/Alertness: Awake/alert Behavior During Therapy: WFL for tasks assessed/performed Overall Cognitive Status: Within Functional Limits for tasks assessed    Blood pressure 153/76, pulse 83, temperature 98.1 F (36.7 C), temperature source Oral, resp. rate 17, height _0  (1.753 m), weight 59.9 kg (132 lb 0.9 oz), SpO2 98 %. Physical Exam  Nursing note and vitals reviewed. Constitutional: He is oriented to person, place, and time. He appears well-developed and well-nourished.  HENT:  Head: Normocephalic and atraumatic.  Right Ear: External ear normal.  Left Ear: External ear normal.  Mouth/Throat: Oropharynx is clear and moist.  Eyes: Conjunctivae and EOM are normal. Pupils are equal, round, and reactive to light.  Neck: Normal range of motion. Neck supple. No JVD present.  Cardiovascular: Normal rate and regular rhythm.   Murmur heard. Respiratory: Effort normal and breath sounds normal. No stridor. No respiratory distress. He has no wheezes. He has no rales.  GI: Soft. Bowel sounds  are normal. He exhibits no distension. There is no tenderness. There is no rebound.  Genitourinary: Penis normal.  Musculoskeletal:  Right BKA well healed no edema , non tender  Left AKA with Ace wrap, no seepage through bandage, mild edema, moderate tenderness to palpation  Neurological: He is alert and oriented to person, place, and time.  Skin: Skin is warm.  Psychiatric: He has a normal mood and affect.  5/5 in BUE and R Hip flexor,  LLE has trace hip flexion  Results for orders placed or performed during the hospital encounter of 10/10/15 (from the past 48 hour(s))  Basic metabolic panel     Status: Abnormal   Collection Time: 10/11/15  3:29 AM  Result Value Ref Range   Sodium 138 135 - 145 mmol/L   Potassium 4.9 3.5 - 5.1 mmol/L   Chloride 97 (L) 101 - 111 mmol/L   CO2 26 22 - 32 mmol/L   Glucose, Bld 79 65 - 99 mg/dL   BUN 53 (H) 6 - 20 mg/dL   Creatinine, Ser 9.88 (H) 0.61 - 1.24 mg/dL   Calcium 8.5 (L) 8.9 - 10.3 mg/dL   GFR calc non Af Amer 4 (L) >60 mL/min   GFR calc Af Amer 5 (L) >60 mL/min    Comment: (NOTE) The eGFR has been calculated using the CKD EPI equation. This calculation has not been validated in all clinical situations. eGFR's persistently <60 mL/min signify possible Chronic Kidney Disease.    Anion gap 15 5 - 15  CBC     Status: Abnormal   Collection Time: 10/11/15  3:29 AM  Result Value Ref Range   WBC 8.2 4.0 - 10.5 K/uL   RBC 3.60 (L) 4.22 - 5.81 MIL/uL   Hemoglobin 10.7 (L) 13.0 - 17.0 g/dL   HCT 35.5 (L) 39.0 - 52.0 %   MCV 98.6 78.0 - 100.0 fL   MCH 29.7 26.0 - 34.0 pg   MCHC 30.1 30.0 - 36.0 g/dL   RDW 18.6 (H) 11.5 - 15.5 %   Platelets 242 150 - 400 K/uL  Prealbumin     Status: Abnormal   Collection Time: 10/11/15  8:30 AM  Result Value Ref Range   Prealbumin 17.4 (L) 18 - 38 mg/dL  Phosphorus     Status: None   Collection Time: 10/11/15  8:30 AM  Result Value Ref Range   Phosphorus 3.6 2.5 - 4.6 mg/dL  Renal function panel      Status: Abnormal   Collection Time: 10/11/15  1:30 PM  Result Value Ref Range   Sodium 137 135 - 145 mmol/L   Potassium 3.7 3.5 - 5.1 mmol/L   Chloride 97 (L) 101 - 111 mmol/L   CO2 29 22 - 32 mmol/L   Glucose, Bld 130 (H) 65 - 99 mg/dL   BUN 17 6 - 20 mg/dL   Creatinine, Ser 5.07 (H) 0.61 - 1.24 mg/dL    Comment: DELTA CHECK NOTED   Calcium 9.0 8.9 - 10.3 mg/dL   Phosphorus 4.2 2.5 - 4.6 mg/dL   Albumin 2.5 (L) 3.5 - 5.0 g/dL   GFR calc non Af Amer 10 (L) >60 mL/min   GFR calc Af Amer 11 (L) >60 mL/min    Comment: (NOTE) The eGFR has been calculated using the CKD EPI equation. This calculation has not been validated in all clinical situations. eGFR's persistently <60 mL/min signify possible Chronic Kidney Disease.    Anion gap 11 5 - 15  CBC     Status: Abnormal   Collection Time: 10/11/15  1:30 PM  Result Value Ref Range   WBC 7.8 4.0 - 10.5 K/uL   RBC 4.00 (L) 4.22 - 5.81 MIL/uL   Hemoglobin 11.7 (L) 13.0 - 17.0 g/dL   HCT 39.2 39.0 - 52.0 %   MCV 98.0 78.0 - 100.0 fL   MCH 29.3 26.0 - 34.0 pg   MCHC 29.8 (L) 30.0 - 36.0 g/dL   RDW  18.7 (H) 11.5 - 15.5 %   Platelets 219 150 - 400 K/uL  Glucose, capillary     Status: Abnormal   Collection Time: 10/11/15  4:11 PM  Result Value Ref Range   Glucose-Capillary 183 (H) 65 - 99 mg/dL  Glucose, capillary     Status: Abnormal   Collection Time: 10/11/15  8:57 PM  Result Value Ref Range   Glucose-Capillary 105 (H) 65 - 99 mg/dL   Comment 1 Notify RN    Comment 2 Document in Chart   Basic metabolic panel     Status: Abnormal   Collection Time: 10/12/15  2:50 AM  Result Value Ref Range   Sodium 132 (L) 135 - 145 mmol/L   Potassium 4.7 3.5 - 5.1 mmol/L    Comment: DELTA CHECK NOTED   Chloride 93 (L) 101 - 111 mmol/L   CO2 27 22 - 32 mmol/L   Glucose, Bld 116 (H) 65 - 99 mg/dL   BUN 29 (H) 6 - 20 mg/dL   Creatinine, Ser 6.75 (H) 0.61 - 1.24 mg/dL   Calcium 9.0 8.9 - 10.3 mg/dL   GFR calc non Af Amer 7 (L) >60 mL/min    GFR calc Af Amer 8 (L) >60 mL/min    Comment: (NOTE) The eGFR has been calculated using the CKD EPI equation. This calculation has not been validated in all clinical situations. eGFR's persistently <60 mL/min signify possible Chronic Kidney Disease.    Anion gap 12 5 - 15  CBC     Status: Abnormal   Collection Time: 10/12/15  2:50 AM  Result Value Ref Range   WBC 6.7 4.0 - 10.5 K/uL   RBC 3.52 (L) 4.22 - 5.81 MIL/uL   Hemoglobin 10.2 (L) 13.0 - 17.0 g/dL   HCT 34.3 (L) 39.0 - 52.0 %   MCV 97.4 78.0 - 100.0 fL   MCH 29.0 26.0 - 34.0 pg   MCHC 29.7 (L) 30.0 - 36.0 g/dL   RDW 18.5 (H) 11.5 - 15.5 %   Platelets 192 150 - 400 K/uL  Glucose, capillary     Status: Abnormal   Collection Time: 10/12/15  6:10 AM  Result Value Ref Range   Glucose-Capillary 102 (H) 65 - 99 mg/dL   Comment 1 Notify RN    Comment 2 Document in Chart    No results found.     Medical Problem List and Plan: 1.  Decreased mobility and self care   secondary to Left AKA POD #2 with prior hx of R BKA 2.  DVT Prophylaxis/Anticoagulation: Pharmaceutical: Lovenox 3. Pain Management: Oxycodone IR 4. Mood: monitor for reactive depression 5. Neuropsych: This patient is capable of making decisions on his own behalf. 6. Skin/Wound Care: Dressing changes and ACE wrap to LLE 7. Fluids/Electrolytes/Nutrition: Monitor I/Os, meal intake, may need dietary consult 8.  BXU:XYBFXOV, lisinopril, coreg, with prn hydralazine 9. ESRD:  HD managed by Nephro M-W F 10 Anemia of chronic disease:  CBC in am, Aranesp weekly in HD 11. NISCM/CAD:Lipitor , ASA, Coreg     Post Admission Physician Evaluation: 1. Functional deficits secondary  to secondary to Left AKA 10/10/15 with prior hx of R BKA. 2. Patient is admitted to receive collaborative, interdisciplinary care between the physiatrist, rehab nursing staff, and therapy team. 3. Patient's level of medical complexity and substantial therapy needs in context of that medical  necessity cannot be provided at a lesser intensity of care such as a SNF. 4. Patient has experienced substantial functional  loss from his/her baseline which was documented above under the "Functional History" and "Functional Status" headings.  Judging by the patient's diagnosis, physical exam, and functional history, the patient has potential for functional progress which will result in measurable gains while on inpatient rehab.  These gains will be of substantial and practical use upon discharge  in facilitating mobility and self-care at the household level. 5. Physiatrist will provide 24 hour management of medical needs as well as oversight of the therapy plan/treatment and provide guidance as appropriate regarding the interaction of the two. 6. 24 hour rehab nursing will assist with bowel management, safety, skin/wound care, disease management, medication administration, pain management and patient education  and help integrate therapy concepts, techniques,education, etc. 7. PT will assess and treat for/with: pre gait, gait training, endurance , safety, equipment, neuromuscular re education.   Goals are: Sup. 8. OT will assess and treat for/with: ADLs, Cognitive perceptual skills, Neuromuscular re education, safety, endurance, equipment.   Goals are: Sup. Therapy may not proceed with showering this patient until POD 4, then with LLE covered. 9. SLP will assess and treat for/with: NA.  Goals are: NA. 10. Case Management and Social Worker will assess and treat for psychological issues and discharge planning. 11. Team conference will be held weekly to assess progress toward goals and to determine barriers to discharge. 12. Patient will receive at least 3 hours of therapy per day at least 5 days per week. 13. ELOS: 7-11 Days       14. Prognosis:  excellent     Charlett Blake M.D. [facility] FAAPM&R (Sports Med, Neuromuscular Med) Diplomate Am Board of Electrodiagnostic Med     1/19/2017oxycodone IR, aranesp weekly in HD

## 2015-10-13 NOTE — Progress Notes (Signed)
Gerlean Ren [facility] Admission Coordinator Signed Physical Medicine and Rehabilitation PMR Pre-admission 10/12/2015 1:41 PM  Related encounter: Admission (Discharged) from 10/10/2015 in [facility]   PMR Admission Coordinator Pre-Admission Assessment  Patient: Randy [name] is an 82 y.o., male MRN: [id number] DOB: 1931-12-21 Height: 5\' 9"  (175.3 cm) Weight: 59.9 kg (132 lb 0.9 oz)  Insurance Information HMO: yes PPO: PCP: IPA: 80/20: OTHER:  PRIMARY: BCBS Medicare Policy#: [id number] Subscriber: self CM Name: Santiago Glad Phone#: [phone] Fax#: [phone] Pre-Cert#: Employer: retired Benefits: Phone #: [phone] Name: Driscilla Moats. Date: 09/24/15 Deduct: $0 Out of Pocket Max: $4900 Life Max:  CIR: $275 per day, days 1-6; $0 days 7 and beyond SNF: $0 days 1-20, $164.50 (21-100) Outpatient: $40 copay  Home Health: 100% Co-Pay:  DME: 80% Co-Pay: 20% Providers: In network SECONDARY: Policy#: Subscriber:  CM Name: Phone#: Fax#:  Pre-Cert#: Employer:  Benefits: Phone #: Name:  Eff. Date: Deduct: Out of Pocket Max: Life Max:  CIR: SNF:  Outpatient: Co-Pay:  Home Health: Co-Pay:  DME: Co-Pay:   Medicaid Application Date: Case Manager:  Disability Application Date: Case Worker:   Emergency Contact Information Contact Information    Name Relation Home Work Mobile   Randy [name],Randy [name] Spouse [phone]  [phone]   Randy [name],Randy [name] Daughter   [phone]   [facility] Daughter   [phone]   Randy [name],Randy [name] Son   [phone]      Current Medical History  Patient Admitting Diagnosis: L-AKA due to PVD and h/o R-BKA History of Present Illness: ABDIKARIM DODGE is a 82 y.o. 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 Patient elected to undergo L-AKA on 10/10/15 by Dr. Donnetta Hutching. Post op has had bleeding from stump requiring dressing changes. PT/OT evaluations to be done today and CIR consulted in anticipation of extensive rehab needs.      Past Medical History  Past Medical History  Diagnosis Date  . Hypercholesterolemia   . Gout   . Nonischemic cardiomyopathy (HCC)     LVEF 35-40%  . Anemia of chronic disease   . Essential hypertension   . Arthritis   . Peripheral vascular disease ([place])     Status post right below knee amputation for a nonhealing wound of the right foot 12/2014  . History of pneumonia 2014  . CAD (coronary artery disease)     Moderate multivessel disease 07/2015 - managed medically  . History of myopericarditis 2015    07/2015  . Stroke [facility])     "they said I did" "I did not know anything about it"  . ESRD on hemodialysis [facility])     M/W/F in [place] - Dr. Florene Glen  . Constipation   . History of blood transfusion   . Pneumonia   . Acute myopericarditis 2015    Family History  family history includes Cancer in his sister; Colon cancer in his brother and brother. There is no history of Anesthesia problems, Hypotension, Malignant hyperthermia, or Pseudochol deficiency.  Prior Rehab/Hospitalizations:  Has the patient had major surgery during 100 days prior to admission? No  Current Medications   Current facility-administered medications:  . 0.9 % sodium chloride infusion, 100 mL, Intravenous, PRN, Alric Seton, PA-C . 0.9 % sodium chloride infusion, 100 mL, Intravenous, PRN,  Alric Seton, PA-C . acetaminophen (TYLENOL) tablet 325-650 mg, 325-650 mg, Oral, Q4H PRN, 650 mg at  10/12/15 0753 **OR** acetaminophen (TYLENOL) suppository 325-650 mg, 325-650 mg, Rectal, Q4H PRN, Angelia Mould, MD . amLODipine (NORVASC) tablet 10 mg, 10 mg, Oral, QHS, Alric Seton, PA-C, 10 mg at 10/11/15 2058 . aspirin chewable tablet 81 mg, 81 mg, Oral, Daily, Angelia Mould, MD, 81 mg at 10/12/15 1214 . atorvastatin (LIPITOR) tablet 40 mg, 40 mg, Oral, q1800, Angelia Mould, MD, 40 mg at 10/11/15 1729 . calcitRIOL (ROCALTROL) capsule 0.5 mcg, 0.5 mcg, Oral, Daily, Angelia Mould, MD, 0.5 mcg at 10/12/15 1215 . carvedilol (COREG) tablet 25 mg, 25 mg, Oral, BID WC, Angelia Mould, MD, 25 mg at 10/12/15 1352 . cloNIDine (CATAPRES) tablet 0.2 mg, 0.2 mg, Oral, Once, Lillia Abed, MD . cloNIDine (CATAPRES) tablet 0.2 mg, 0.2 mg, Oral, BID PRN, Angelia Mould, MD, 0.2 mg at 10/10/15 1512 . [START ON 10/18/2015] Darbepoetin Alfa (ARANESP) injection 100 mcg, 100 mcg, Intravenous, Q Wed-HD, Alric Seton, PA-C . docusate sodium (COLACE) capsule 100 mg, 100 mg, Oral, Daily, Angelia Mould, MD, 100 mg at 10/12/15 1215 . [START ON 10/13/2015] doxycycline (VIBRA-TABS) tablet 100 mg, 100 mg, Oral, Q12H, Angelia Mould, MD . enoxaparin (LOVENOX) injection 30 mg, 30 mg, Subcutaneous, Q24H, Angelia Mould, MD, 30 mg at 10/12/15 1352 . feeding supplement (NEPRO CARB STEADY) liquid 237 mL, 237 mL, Oral, BID BM, Alric Seton, PA-C . ferric gluconate (NULECIT) 125 mg in sodium chloride 0.9 % 100 mL IVPB, 125 mg, Intravenous, Q M,W,F-HD, Alric Seton, PA-C, 125 mg at 10/11/15 1010 . gabapentin (NEURONTIN) capsule 100 mg, 100 mg, Oral, QHS, Angelia Mould, MD, 100 mg at 10/11/15 2058 . guaiFENesin-dextromethorphan (ROBITUSSIN DM) 100-10 MG/5ML syrup 15 mL, 15 mL, Oral, Q4H PRN, Angelia Mould, MD . hydrALAZINE  (APRESOLINE) injection 5 mg, 5 mg, Intravenous, Q20 Min PRN, Angelia Mould, MD . hydrALAZINE (APRESOLINE) tablet 25 mg, 25 mg, Oral, TID PRN, Angelia Mould, MD . HYDROmorphone (DILAUDID) injection 0.5-1 mg, 0.5-1 mg, Intravenous, Q2H PRN, Angelia Mould, MD, 1 mg at 10/12/15 1216 . isosorbide mononitrate (IMDUR) 24 hr tablet 15 mg, 15 mg, Oral, QHS, Angelia Mould, MD, 15 mg at 10/11/15 2057 . labetalol (NORMODYNE,TRANDATE) injection 10 mg, 10 mg, Intravenous, Q10 min PRN, Angelia Mould, MD . latanoprost (XALATAN) 0.005 % ophthalmic solution 1 drop, 1 drop, Both Eyes, QHS, Angelia Mould, MD, 1 drop at 10/11/15 2101 . lisinopril (PRINIVIL,ZESTRIL) tablet 10 mg, 10 mg, Oral, QHS, Alric Seton, PA-C, 10 mg at 10/11/15 2057 . metoprolol (LOPRESSOR) injection 2-5 mg, 2-5 mg, Intravenous, Q2H PRN, Angelia Mould, MD . multivitamin (RENA-VIT) tablet 1 tablet, 1 tablet, Oral, Daily, Angelia Mould, MD, 1 tablet at 10/12/15 1215 . ondansetron (ZOFRAN) injection 4 mg, 4 mg, Intravenous, Q6H PRN, Angelia Mould, MD . oxyCODONE (Oxy IR/ROXICODONE) immediate release tablet 10 mg, 10 mg, Oral, Q6H PRN, Angelia Mould, MD, 10 mg at 10/12/15 0626 . oxyCODONE-acetaminophen (PERCOCET/ROXICET) 5-325 MG per tablet 1-2 tablet, 1-2 tablet, Oral, Q4H PRN, Angelia Mould, MD, 2 tablet at 10/11/15 1828 . pantoprazole (PROTONIX) EC tablet 40 mg, 40 mg, Oral, Daily, Angelia Mould, MD, 40 mg at 10/12/15 1215 . phenol (CHLORASEPTIC) mouth spray 1 spray, 1 spray, Mouth/Throat, PRN, Angelia Mould, MD . sevelamer carbonate (RENVELA) tablet 1,600 mg, 1,600 mg, Oral, PRN, Angelia Mould, MD . sevelamer carbonate (RENVELA) tablet 2,400 mg, 2,400 mg, Oral, TID WC, Angelia Mould, MD, 2,400 mg at 10/12/15 1214  Patients Current  Diet: Diet renal with fluid restriction Fluid restriction:: 1200 mL Fluid; Room  service appropriate?: Yes; Fluid consistency:: Thin  Precautions / Restrictions Precautions Precautions: Fall Restrictions Weight Bearing Restrictions: No   Has the patient had 2 or more falls or a fall with injury in the past year?No; pt. Has had one fall out of bed per wife within the last year but no injuries from fall  Prior Activity Level Limited Community (1-2x/wk): Pt. primarily goes out for hemodialysis and MD appointments. Pt's friend transports him to hemodialysis  [place] Devices/Equipment: Environmental consultant (specify type), Wheelchair, Bedside commode/3-in-1 Home Equipment: Environmental consultant - 2 wheels, [employer] - quad, Civil engineer, contracting, Wheelchair - manual, Transport planner  Prior Device Use: Indicate devices/aids used by the patient prior to current illness, exacerbation or injury? Manual wheelchair; R BKA prosthesis  Prior Functional Level Prior Function Level of Independence: Needs assistance Gait / Transfers Assistance Needed: pt uses w/c as primary means of mobility, transferred independently to bed/toilet/chair, assisted for tub transfers ADL's / Homemaking Assistance Needed: Wife assists pt to get down into jetted tub and washes pt's back, wife performs all housekeeping and meal prep Comments: Pt does not like to shower.  Self Care: Did the patient need help bathing, dressing, using the toilet or eating? Needed some help  Indoor Mobility: Did the patient need assistance with walking from room to room (with or without device)? Independent With wheelchair  Stairs: Did the patient need assistance with internal or external stairs (with or without device)? Needed some helppt. Used wheelchair ramp PTA  Functional Cognition: Did the patient need help planning regular tasks such as shopping or remembering to take medications? Needed some help  Current Functional Level Cognition  Overall Cognitive Status: Within Functional Limits for tasks  assessed Orientation Level: Oriented X4   Extremity Assessment (includes Sensation/Coordination)  Upper Extremity Assessment: Overall WFL for tasks assessed  Lower Extremity Assessment: Defer to PT evaluation RLE Deficits / Details: mildly weak proximally, but functional LLE Deficits / Details: moves against gravity.    ADLs  Overall ADL's : Needs assistance/impaired Eating/Feeding: Independent, Bed level Grooming: Wash/dry hands, Wash/dry face, Minimal assistance, Sitting Upper Body Bathing: Moderate assistance, Sitting Lower Body Bathing: Maximal assistance, Sitting/lateral leans Upper Body Dressing : Minimal assistance, Sitting Lower Body Dressing: Minimal assistance, Sitting/lateral leans Lower Body Dressing Details (indicate cue type and reason): to doff prosthesis General ADL Comments: Pt ready to return to bed following up in chair for lunch.    Mobility  Overal bed mobility: Needs Assistance Bed Mobility: Sit to Supine Rolling: Min guard Supine to sit: Mod assist Sit to supine: Supervision General bed mobility comments: no physical assist from sit to supine, used UEs and trendelenberg to pull himself up in bed.    Transfers  Overall transfer level: Needs assistance Equipment used: Rolling walker (2 wheeled), None Transfers: Sit to/from Stand, Stand Pivot Transfers Sit to Stand: +2 physical assistance, Mod assist (from chair) Stand pivot transfers: +2 safety/equipment, Mod assist Squat pivot transfers: Mod assist, From elevated surface General transfer comment: assist to rise, balance and pivot    Ambulation / Gait / Stairs / Wheelchair Mobility       Posture / Balance Dynamic Sitting Balance Sitting balance - Comments: doffed prosthesis at EOB, without LOB Balance Overall balance assessment: Needs assistance Sitting-balance support: Feet supported, Single extremity supported, Bilateral upper extremity supported Sitting balance-Leahy Scale:  Fair Sitting balance - Comments: doffed prosthesis at EOB, without LOB Postural control:  Posterior lean Standing balance support: Bilateral upper extremity supported Standing balance-Leahy Scale: Poor Standing balance comment: stood in RW on R prosthesis with significant assist and generally difficulty getting fully up over the prosthesis.    Special needs/care consideration BiPAP/CPAP no CPM no Continuous Drip IV no Dialysis Yes Days MWF Life Vest no Oxygen no Special Bed no Trach Size no Wound Vac (area) no  Skin WDL  Bowel mgmt:last BM 10/09/15, continent: bed pan with assist Bladder mgmt: pt. Reports minimal urine production at baseline but non this admission Diabetic mgmt no     Previous Home Environment Living Arrangements: Spouse/significant other Available Help at Discharge: (wife and pt's niece Barrett Worlds assist pt.) Type of Home: House Home Layout: One level Home Access: Stairs to enter, Ramped entrance Entrance Stairs-Rails: None Entrance Stairs-Number of Steps: 2 Bathroom Shower/Tub: Chiropodist: Handicapped height Bathroom Accessibility: Yes How Accessible: Accessible via wheelchair [place]: (not currently receiving per wife; used [place]) Type of Home Care Services: Home PT, [place] (if known): [facility]  Discharge Living Setting Plans for Discharge Living Setting: Patient's home Type of Home at Discharge: House Discharge Home Layout: One level Discharge Home Access: Stairs to enter, [place] entrance Discharge Bathroom Shower/Tub: [place] unit Discharge Bathroom Toilet: Handicapped height Discharge Bathroom Accessibility: Yes How Accessible: Accessible via wheelchair Does the patient have any problems obtaining your medications?: No  Social/Family/Support Systems Patient Roles: Spouse, Parent (4 children in [place]) Contact Information: ;  [phone] Anticipated Caregiver: wife Cuong Soloff and niece Senai Havner who assists PRN Ability/Limitations of Caregiver: Oris Drone reports she drives and walks short distances without assist; needs transport chair she visits pt. in the hospital Caregiver Availability: 24/7 Discharge Plan Discussed with Primary Caregiver: Yes Is Caregiver In Agreement with Plan?: Yes Does Caregiver/Family have Issues with Lodging/Transportation while Pt is in Rehab?: No   Goals/Additional Needs Patient/Family Goal for Rehab: supervision and modified independent for PT/OT Expected length of stay: 10-13 days Cultural Considerations: none Dietary Needs: renal diet with 1200 ml fluid restrictions , thin liquids Equipment Needs: TBA Pt/Family Agrees to Admission and willing to participate: Yes Program Orientation Provided & Reviewed with Pt/Caregiver Including Roles & Responsibilities: Yes   Decrease burden of Care through IP rehab admission: n/a   Possible need for SNF placement upon discharge: Not anticipated   Patient Condition: This patient's condition remains as documented in the consult dated 10/11/15 , in which the Rehabilitation Physician determined and documented that the patient's condition is appropriate for intensive rehabilitative care in an inpatient rehabilitation facility. Will admit to inpatient rehab today.  Preadmission Screen Completed By: Gerlean Ren, 10/12/2015 4:45 PM ______________________________________________________________________  Discussed status with Dr. Letta Pate on 10/12/15 at 1649 and received telephone approval for admission today.  Admission Coordinator: Gerlean Ren, time [id number] /Date 10/12/15          Cosigned by: Charlett Blake, MD at 10/12/2015 4:53 PM  Revision History

## 2015-10-13 NOTE — Progress Notes (Signed)
[facility] PHYSICAL MEDICINE & REHABILITATION     PROGRESS NOTE  Subjective/Complaints: Pt seen this AM sitting up in bed.  He states he slept "like a baby" last night and is looking forward to starting therapies today.    ROS: Denies CP, SOB, n/v/d.   Objective: Vital Signs: Blood pressure 137/63, pulse 84, temperature 99.2 F (37.3 C), temperature source Oral, resp. rate 18, height 5\' 9"  (1.753 m), weight 60.8 kg (134 lb 0.6 oz), SpO2 98 %. No results found.  Recent Labs  10/11/15 1330 10/12/15 0250  WBC 7.8 6.7  HGB 11.7* 10.2*  HCT 39.2 34.3*  PLT 219 192    Recent Labs  10/11/15 1330 10/12/15 0250  NA 137 132*  K 3.7 4.7  CL 97* 93*  GLUCOSE 130* 116*  BUN 17 29*  CREATININE 5.07* 6.75*  CALCIUM 9.0 9.0   CBG (last 3)   Recent Labs  10/12/15 0610 10/12/15 1057 10/12/15 1611  GLUCAP 102* 133* 111*    Wt Readings from Last 3 Encounters:  10/12/15 60.8 kg (134 lb 0.6 oz)  10/12/15 59.9 kg (132 lb 0.9 oz)  10/04/15 54.432 kg (120 lb)    Physical Exam:  BP 137/63 mmHg  Pulse 84  Temp(Src) 99.2 F (37.3 C) (Oral)  Resp 18  Ht 5\' 9"  (1.753 m)  Wt 60.8 kg (134 lb 0.6 oz)  BMI 19.79 kg/m2  SpO2 98% Constitutional: He appears well-developed and well-nourished. NAD. Vital signs reviewed. HENT: Normocephalic and atraumatic. Right Ear: External ear normal. Left Ear: External ear normal.  Eyes: Conjunctivae and EOM are normal.   Cardiovascular: Normal rate and regular rhythm. Murmur heard. Respiratory: Effort normal and breath sounds normal. No respiratory distress.  GI: Soft. Bowel sounds are normal. He exhibits no distension. There is no tenderness.  Musculoskeletal: He exhibits tenderness to BKA site. He exhibits no edema.  Neurological: He is alert and oriented.  Speech clear.  Follows basic commands without difficulty.  B/l UE 5/5 Right hip flexion 5/5 Left hip flexion with pain inhibition  Skin: Skin is warm and dry. No rash noted. No  erythema.  Left AKA with dressing c/d/i  Right BKA incision well healed and intact.  Psychiatric: He has a normal mood and affect. His behavior is normal. Thought content normal.   Assessment/Plan: 1. 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 Physiatrist is providing close team supervision and 24 hour management of active medical problems listed below. Physiatrist and rehab team continue to assess barriers to discharge/monitor patient progress toward functional and medical goals.  Function:  Bathing Bathing position      Bathing parts      Bathing assist        Upper Body Dressing/Undressing Upper body dressing                    Upper body assist        Lower Body Dressing/Undressing Lower body dressing                                  Lower body assist        Toileting Toileting          Toileting assist     Transfers Chair/bed transfer  Psychologist, sport and exercise Comprehension    Expression    Social Interaction    Problem Solving    Memory      Medical Problem List and Plan: 1. 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 DVT Prophylaxis/Anticoagulation: Pharmaceutical: Lovenox 3. Pain Management: Oxycodone IR 4. Mood: monitor for reactive depression 5. Neuropsych: This patient is capable of making decisions on his own behalf. 6. Skin/Wound Care: Dressing changes and ACE wrap to LLE. 7. Fluids/Electrolytes/Nutrition: Monitor I/Os, meal intake, may need dietary consult  Will cont to monitor periodically 8. HTN: norvasc, lisinopril, coreg, with prn hydralazine  Will cont to monitor and adjust as needed 9. ESRD: HD managed by Nephro M-W F 10 Anemia of chronic disease:  Aranesp weekly in HD  With ABLA  Hb  10.2 on 1/20  Will cont to monitor periodically  11. NISCM/CAD:Lipitor , ASA, Coreg  LOS (Days) 1 A FACE TO FACE EVALUATION WAS PERFORMED  Randy [name] Lorie Phenix 10/13/2015 8:13 AM

## 2015-10-13 NOTE — Progress Notes (Signed)
Occupational Therapy Session Note  Patient Details  Name: Randy [name] MRN: [id number] Date of Birth: [birth year]  Today's Date: 10/13/2015 OT Individual Time:1300-1400 55 Min  Short Term Goals: Week 1:  OT Short Term Goal 1 (Week 1):  Pt. will perform LB bathing with SBA for lateral and forward leaning OT Short Term Goal 2 (Week 1): Pt. will be LB dressing with SBA OT Short Term Goal 3 (Week 1): Pt. will transfer to [facility] with min assist. OT Short Term Goal 4 (Week 1): Pt. will transfer to tub bench with min assist  Skilled Therapeutic Interventions/Progress Updates: ADL-retraining with focus on SB transfers, w/c<>BSC over toielt and w/c > bed.   Pt received seated in w/c with friend visiting.   Pt defers use of RLE prosthesis d/t discomfort for transfers but is willing to engage in transfer to tub bench and toilet using slide board to update safety plan.   With extra time to position w/c and problem-solve, pt completes slide board transfer to drop-arm commode placed over toilet with moderate assist to lean to left and advance to right side toward toilet.   Pt educated on push-up lift off commode to remove pants for toileting with need for extra time time.   OT recommends use of bedpan for urgent BM.   Pt reports minimal need to urinate d/t HD treatments.   Pt returns to w/c placed for repeated transfer to right with pt completing transfer with less assistance required (still moderate assist, pt =50%).   Pt then reports awareness of ace wrap unraveling at LLE during transfer and he requests adjustment.   OT advised return to bed to simplify repair of ace wrap.   After setup to place w/c correctly pt completes another slide board transfer, again with moderate assist although with improved efficiency.    Pt left with RN attending to dressing change at end of session.    Therapy Documentation Precautions:  Precautions Precautions: Fall Restrictions Weight Bearing Restrictions: Yes LLE Weight  Bearing: Non weight bearing  Vital Signs: Therapy Vitals Temp: 98.8 F (37.1 C) Temp Source: Oral Pulse Rate: 84 Resp: 18 BP: 119/64 mmHg Patient Position (if appropriate): Lying Oxygen Therapy SpO2: 98 % O2 Device: Not Delivered   Pain: Pain Assessment Pain Assessment: No/denies pain Pain Score: 5  Pain Type: Acute pain Pain Location: Leg Pain Orientation: Right Pain Descriptors / Indicators: Aching Pain Onset: On-going Pain Intervention(s): RN made aware;Repositioned;Rest  See Function Navigator for Current Functional Status.   Therapy/Group: Individual Therapy  [place] 10/13/2015, 3:06 PM

## 2015-10-14 NOTE — Progress Notes (Signed)
[place] PHYSICAL MEDICINE & REHABILITATION     PROGRESS NOTE  Subjective/Complaints:  No new issues overnight. Pain controlled. Slept well again.  ROS: Denies CP, SOB, n/v/d.   Objective: Vital Signs: Blood pressure 155/64, pulse 86, temperature 98.5 F (36.9 C), temperature source Oral, resp. rate 18, height 5\' 9"  (1.753 m), weight 58.3 kg (128 lb 8.5 oz), SpO2 98 %. No results found.  Recent Labs  10/12/15 0250 10/13/15 1518  WBC 6.7 6.5  HGB 10.2* 9.8*  HCT 34.3* 32.0*  PLT 192 206    Recent Labs  10/12/15 0250 10/13/15 1518  NA 132* 131*  K 4.7 4.7  CL 93* 92*  GLUCOSE 116* 133*  BUN 29* 58*  CREATININE 6.75* 10.86*  CALCIUM 9.0 8.9   CBG (last 3)   Recent Labs  10/12/15 0610 10/12/15 1057 10/12/15 1611  GLUCAP 102* 133* 111*    Wt Readings from Last 3 Encounters:  10/14/15 58.3 kg (128 lb 8.5 oz)  10/12/15 59.9 kg (132 lb 0.9 oz)  10/04/15 54.432 kg (120 lb)    Physical Exam:  BP 155/64 mmHg  Pulse 86  Temp(Src) 98.5 F (36.9 C) (Oral)  Resp 18  Ht 5\' 9"  (1.753 m)  Wt 58.3 kg (128 lb 8.5 oz)  BMI 18.97 kg/m2  SpO2 98% Constitutional: He appears well-developed and well-nourished. NAD. Vital signs reviewed. HENT: Normocephalic and atraumatic. Right Ear: External ear normal. Left Ear: External ear normal.  Eyes: Conjunctivae and EOM are normal.   Cardiovascular: Normal rate and regular rhythm. Murmur heard. Respiratory: Effort normal and breath sounds normal. No respiratory distress.  GI: Soft. Bowel sounds are normal. He exhibits no distension. There is no tenderness.  Musculoskeletal: He exhibits tenderness to BKA site. He exhibits no edema.  Neurological: He is alert and oriented.  Speech clear.  Follows basic commands without difficulty.  B/l UE 5/5 Right hip flexion 5/5 Left hip flexion with pain inhibition  Skin: Skin is warm and dry. No rash noted. No erythema.  Left AKA with dressing c/d/i  Right BKA incision well healed and  intact.  Psychiatric: He has a normal mood and affect. His behavior is normal. Thought content normal.   Assessment/Plan: 1. 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 Physiatrist is providing close team supervision and 24 hour management of active medical problems listed below. Physiatrist and rehab team continue to assess barriers to discharge/monitor patient progress toward functional and medical goals.  Function:  Bathing Bathing position      Bathing parts Body parts bathed by patient: Right arm, Left arm, Chest, Abdomen Body parts bathed by helper: Front perineal area, Buttocks, Right upper leg, Left upper leg  Bathing assist Assist Level: Touching or steadying assistance(Pt > 75%)      Upper Body Dressing/Undressing Upper body dressing   What is the patient wearing?: Pull over shirt/dress     Pull over shirt/dress - Perfomed by patient: Put head through opening, Thread/unthread left sleeve, Thread/unthread right sleeve, Pull shirt over trunk          Upper body assist Assist Level: Supervision or verbal cues      Lower Body Dressing/Undressing Lower body dressing   What is the patient wearing?: Pants, Underwear Underwear - Performed by patient: Pull underwear up/down Underwear - Performed by helper: Pull underwear up/down, Thread/unthread left underwear leg, Thread/unthread right underwear leg  Pants- Performed by helper: Pull pants up/down, Thread/unthread left pants leg, Thread/unthread right pants leg                      Lower body assist Assist for lower body dressing: Touching or steadying assistance (Pt > 75%)      Toileting Toileting Toileting activity did not occur: Safety/medical concerns     Geologist, engineering Devices: Prosthesis/orthosis  Toileting assist     Transfers Chair/bed transfer     Chair/bed transfer  assist level: Maximal assist (Pt 25 - 49%/lift and lower)       Locomotion Ambulation Ambulation activity did not occur: Safety/medical concerns         Wheelchair   Type: Manual Max wheelchair distance: 100 Assist Level: Supervision or verbal cues  Cognition Comprehension Comprehension assist level: Follows basic conversation/direction with extra time/assistive device  Expression Expression assist level: Expresses basic needs/ideas: With no assist  Social Interaction Social Interaction assist level: Interacts appropriately 90% of the time - Needs monitoring or encouragement for participation or interaction.  Problem Solving Problem solving assist level: Solves complex 90% of the time/cues < 10% of the time  Memory Memory assist level: Recognizes or recalls 90% of the time/requires cueing < 10% of the time    Medical Problem List and Plan: 1. 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 DVT Prophylaxis/Anticoagulation: Pharmaceutical: Lovenox 3. Pain Management: Oxycodone IR 4. Mood: monitor for reactive depression 5. Neuropsych: This patient is capable of making decisions on his own behalf. 6. Skin/Wound Care: Dressing changes and ACE wrap to LLE. 7. Fluids/Electrolytes/Nutrition: Monitor I/Os, meal intake, may need dietary consult  Will cont to monitor periodically 8. HTN: norvasc, lisinopril, coreg, with prn hydralazine  Will cont to monitor and adjust as needed 9. ESRD: HD managed by Nephro [facility]. HD after therapies each day 10 Anemia of chronic disease:  Aranesp weekly in HD  With ABLA  Hb 10.2 on 1/20  Will cont to monitor periodically  11. NISCM/CAD:Lipitor , ASA, Coreg  LOS (Days) 2 A FACE TO FACE EVALUATION WAS PERFORMED  Randy [name],Randy [name] 10/14/2015 8:40 AM

## 2015-10-14 NOTE — Progress Notes (Addendum)
[facility] Progress Note   Subjective: no compliants  Filed Vitals:   10/13/15 1800 10/13/15 1820 10/14/15 0513 10/14/15 0753  BP: 153/115 137/72 145/67 155/64  Pulse: 87 72 81 86  Temp:  98.8 F (37.1 C) 98.5 F (36.9 C)   TempSrc:  Oral Oral   Resp: 18 16 18    Height:      Weight:  58.5 kg (128 lb 15.5 oz) 58.3 kg (128 lb 8.5 oz)   SpO2:   98%     Inpatient medications: . amLODipine  10 mg Oral QHS  . aspirin  81 mg Oral Daily  . atorvastatin  40 mg Oral q1800  . calcitRIOL  0.5 mcg Oral Daily  . carvedilol  25 mg Oral BID WC  . [START ON 10/18/2015] darbepoetin (ARANESP) injection - DIALYSIS  100 mcg Intravenous Q Wed-HD  . enoxaparin (LOVENOX) injection  30 mg Subcutaneous Q24H  . feeding supplement (NEPRO CARB STEADY)  237 mL Oral BID BM  . ferric gluconate (FERRLECIT/NULECIT) IV  125 mg Intravenous Q M,W,F-HD  . gabapentin  100 mg Oral QHS  . isosorbide mononitrate  15 mg Oral QHS  . latanoprost  1 drop Both Eyes QHS  . lisinopril  10 mg Oral QHS  . multivitamin  1 tablet Oral QHS  . oxyCODONE  10 mg Oral QHS  . pantoprazole  40 mg Oral Daily  . senna-docusate  2 tablet Oral QHS  . sevelamer carbonate  2,400 mg Oral TID WC     sodium chloride, acetaminophen, aluminum hydroxide, bisacodyl, cloNIDine, diphenhydrAMINE, guaiFENesin-dextromethorphan, methocarbamol, ondansetron, oxyCODONE, phenol, prochlorperazine **OR** prochlorperazine **OR** prochlorperazine, sevelamer carbonate, sodium phosphate, traMADol, traZODone  Exam: Alert elderly BM no distress, up in WC No jvd Chest clear bilat RRR no mrg Abd soft ntnd no mass or ascites Ext L AKA and R BKA No edema Neuro is nf ox 3  [facility] MWF  3.5h  62kg   2/2.25 bath LFA AVF  Hep 6600  Mircera 100 q 2wks last 1/11 Calc 0.5 ug      Assessment: 1 SP L AKA 1/17 2 Debility on rehab 3 ESRD  4 Vol 4kg under dry 5 HTN stbble on 3 bp meds 6 MBD cont meds 7 Anemia cont esa/ Fe  Plan - next  HD Monday   Kelly Splinter MD [facility] pager [phone]    cell [phone] 10/14/2015, 12:41 PM    Recent Labs Lab 10/11/15 0830 10/11/15 1330 10/12/15 0250 10/13/15 1518  NA  --  137 132* 131*  K  --  3.7 4.7 4.7  CL  --  97* 93* 92*  CO2  --  29 27 22   GLUCOSE  --  130* 116* 133*  BUN  --  17 29* 58*  CREATININE  --  5.07* 6.75* 10.86*  CALCIUM  --  9.0 9.0 8.9  PHOS 3.6 4.2  --  6.6*    Recent Labs Lab 10/10/15 0640 10/11/15 1330 10/13/15 1518  AST 18  --   --   ALT 9*  --   --   ALKPHOS 56  --   --   BILITOT 0.6  --   --   PROT 6.6  --   --   ALBUMIN 2.8* 2.5* 2.2*    Recent Labs Lab 10/11/15 1330 10/12/15 0250 10/13/15 1518  WBC 7.8 6.7 6.5  HGB 11.7* 10.2* 9.8*  HCT 39.2 34.3* 32.0*  MCV 98.0 97.4 96.1  PLT 219 192 206

## 2015-10-15 ENCOUNTER — Inpatient Hospital Stay (HOSPITAL_COMMUNITY): Payer: Medicare Other | Admitting: Occupational Therapy

## 2015-10-15 ENCOUNTER — Inpatient Hospital Stay (HOSPITAL_COMMUNITY): Payer: Medicare Other | Admitting: Physical Therapy

## 2015-10-15 DIAGNOSIS — N189 Chronic kidney disease, unspecified: Secondary | ICD-10-CM

## 2015-10-15 DIAGNOSIS — D631 Anemia in chronic kidney disease: Secondary | ICD-10-CM

## 2015-10-15 DIAGNOSIS — Z992 Dependence on renal dialysis: Secondary | ICD-10-CM

## 2015-10-15 DIAGNOSIS — N186 End stage renal disease: Secondary | ICD-10-CM

## 2015-10-15 NOTE — Progress Notes (Signed)
Pt reporting pain in leg. Swollen and warm to touch but incision line clean and not pink, minimal serous drainage today. MD made aware in morning.

## 2015-10-15 NOTE — Progress Notes (Signed)
Physical Therapy Session Note  Patient Details  Name: Randy [name] MRN: [id number] Date of Birth: [birth year]  Today's Date: 10/15/2015 PT Individual Time: 1415-1445 PT Individual Time Calculation (min): 30 min   Short Term Goals: Week 1:  PT Short Term Goal 1 (Week 1): pt will perform functional transfers with min A PT Short Term Goal 2 (Week 1): pt will demo dyanmic sitting balance for functional task with min A  Skilled Therapeutic Interventions/Progress Updates:  Pt was seen bedside in the pm. Pt transferred supine to edge of bed with side rail and S with increased time. Pt transferred edge of bed to w/c with sliding board and min A with verbal cues. Pt propelled w/c to gymabout 150 feet with B UEs and S. Pt performed 3 sets x 10 reps each arm chair push ups, also worked on [employer] hip flex L AKA. Pt propelled w/c back to room about 150 feet with B UEs and S. Pt left sitting up in w/c with call bell within reach.   Therapy Documentation Precautions:  Precautions Precautions: Fall Restrictions Weight Bearing Restrictions: Yes LLE Weight Bearing: Non weight bearing General:   Pain: Pt c/o mild pain L AKA.   See Function Navigator for Current Functional Status.   Therapy/Group: Individual Therapy  Dub Amis 10/15/2015, 3:33 PM

## 2015-10-15 NOTE — Progress Notes (Signed)
Physical Therapy Session Note  Patient Details  Name: Randy [name] MRN: [id number] Date of Birth: 1932/04/10  Today's Date: 10/15/2015 PT Individual Time: 0800-0915 PT Individual Time Calculation (min): 75 min   Short Term Goals: Week 1:  PT Short Term Goal 1 (Week 1): pt will perform functional transfers with min A PT Short Term Goal 2 (Week 1): pt will demo dyanmic sitting balance for functional task with min A  Skilled Therapeutic Interventions/Progress Updates:  Pt was seen bedside in the am. Pt transferred supine to long sitting with S and increased time. Pt required min A to scoot to edge of bed. Pt transferred edge of bed to w/c with sliding board and min A with verbal cues. Pt propelled w/c about 150 feet with B UEs and S. Pt transferred w.c to edge of mat with sliding board and min A with verbal cues. Pt transferred edge of mat to supine with S and increased time. While on mat, re wrapped L AKA. Pt performed hip flex, hip abd/add and R SAQs, 3 sets x 10 reps each. Pt transferred supine to edge of mat with S and increased time. Pt transferred edge of mat to w/c with sliding board and min A with verbal cues. Pt propelled w/c back to room about 150 feet with B UEs and S. Pt transferred w/c to edge of bed with min A and sliding board. Pt transferred edge of bed to supine with S. Pt left sitting up in bed with call bell within reach.   Therapy Documentation Precautions:  Precautions Precautions: Fall Restrictions Weight Bearing Restrictions: Yes LLE Weight Bearing: Non weight bearing General:   Vital Signs:  Pain: Pt initially no c/o pain, pt c/o 6/10 pain L residual limb after transferring OOB, pt medicated by nursing. See Function Navigator for Current Functional Status.   Therapy/Group: Individual Therapy  Dub Amis 10/15/2015, 12:24 PM

## 2015-10-15 NOTE — Progress Notes (Signed)
Occupational Therapy Session Note  Patient Details  Name: Randy [name] MRN: [id number] Date of Birth: 1931-11-08  Today's Date: 10/15/2015 OT Individual Time:  -    1000-1100  (60 min)     1st session                                            1530-1600  (30 min)     2nd session       Short Term Goals: Week 1:  OT Short Term Goal 1 (Week 1):  Pt. will perform LB bathing with SBA for lateral and forward leaning OT Short Term Goal 2 (Week 1): Pt. will be LB dressing with SBA OT Short Term Goal 3 (Week 1): Pt. will transfer to [facility] with min assist. OT Short Term Goal 4 (Week 1): Pt. will transfer to tub bench with min assist   Skilled Therapeutic Interventions/Progress Updates:    1st session:  OT addressed basic ADLtraining at sink level in wc.  Addressed functional transfers, bed mobility, therapeutic activities in functional task. Provided tactile and gestural cues for patient to shift laterally for transfer.  Pt. Went from supine to sit>wc with min assist.  Pt bathed self at sink with assistance for doffing pants (mod).  Addressed lateral leaning for donning pants over hips with mod assist.  Pt completed dressing.  Performed grooming at sink with set up.    2nd session:  Engaged in therapeutic activities.  Pt needed much encouragement to participate, but agreed to do theraband with UE and HOB up 45 degrees.  Went through 1 set of 4 exercises with pt reporting he did not want to stress his left arm he used for dialysis.  Forego last exercise on Left UE as pt showing signs of fatigue.  .    Therapy Documentation Precautions:  Precautions Precautions: Fall Restrictions Weight Bearing Restrictions: Yes LLE Weight Bearing: Non weight bearing    Pain: Pain Assessment Pain Assessment: 0-10 Pain Score: 5 Pain Type: Acute pain Pain Location: Leg Pain Orientation: Left Pain Descriptors / Indicators: Aching;Throbbing Pain Frequency: Constant Pain Onset: On-going Pain Intervention(s):  Medication (See eMAR) ADL:          See Function Navigator for Current Functional Status.   Therapy/Group: Individual Therapy  Lisa Roca 10/15/2015, 10:11 AM

## 2015-10-15 NOTE — Plan of Care (Signed)
Problem: RH PAIN MANAGEMENT Goal: RH STG PAIN MANAGED AT OR BELOW PT'S PAIN GOAL < 4  Outcome: Not Progressing Pt consistently rating pain at 5 or 6, needs tramadol between Oxy to endure therapy.

## 2015-10-15 NOTE — Progress Notes (Signed)
[place] PHYSICAL MEDICINE & REHABILITATION     PROGRESS NOTE  Subjective/Complaints:  Nurse reports some pain in right stump today. Pt denies any issues this morning. Did not get what he ordered for breakfast though  ROS: Denies CP, SOB, n/v/d.   Objective: Vital Signs: Blood pressure 127/57, pulse 77, temperature 98.4 F (36.9 C), temperature source Oral, resp. rate 18, height 5\' 9"  (1.753 m), weight 59.9 kg (132 lb 0.9 oz), SpO2 99 %. No results found.  Recent Labs  10/13/15 1518  WBC 6.5  HGB 9.8*  HCT 32.0*  PLT 206    Recent Labs  10/13/15 1518  NA 131*  K 4.7  CL 92*  GLUCOSE 133*  BUN 58*  CREATININE 10.86*  CALCIUM 8.9   CBG (last 3)   Recent Labs  10/12/15 1057 10/12/15 1611  GLUCAP 133* 111*    Wt Readings from Last 3 Encounters:  10/15/15 59.9 kg (132 lb 0.9 oz)  10/12/15 59.9 kg (132 lb 0.9 oz)  10/04/15 54.432 kg (120 lb)    Physical Exam:  BP 127/57 mmHg  Pulse 77  Temp(Src) 98.4 F (36.9 C) (Oral)  Resp 18  Ht 5\' 9"  (1.753 m)  Wt 59.9 kg (132 lb 0.9 oz)  BMI 19.49 kg/m2  SpO2 99% Constitutional: He appears well-developed and well-nourished. NAD. Vital signs reviewed. HENT: Normocephalic and atraumatic. Right Ear: External ear normal. Left Ear: External ear normal.  Eyes: Conjunctivae and EOM are normal.   Cardiovascular: Normal rate and regular rhythm. Murmur heard. Respiratory: Effort normal and breath sounds normal. No respiratory distress.  GI: Soft. Bowel sounds are normal. He exhibits no distension. There is no tenderness.  Musculoskeletal: He exhibits ongoing tenderness to BKA site. Expected stump swelling.  Neurological: He is alert and oriented.  Speech clear.  Follows basic commands without difficulty.  B/l UE 5/5 Right hip flexion 5/5 Left hip flexion with pain inhibition  Skin: Skin is warm and dry. No rash noted. No erythema.  Left AKA with dressing c/d/i. Limb warm,  Right BKA incision well healed and  intact.  Psychiatric: He has a normal mood and affect. His behavior is normal. Thought content normal.   Assessment/Plan: 1. 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 Physiatrist is providing close team supervision and 24 hour management of active medical problems listed below. Physiatrist and rehab team continue to assess barriers to discharge/monitor patient progress toward functional and medical goals.  Function:  Bathing Bathing position      Bathing parts Body parts bathed by patient: Right arm, Left arm, Chest, Abdomen Body parts bathed by helper: Front perineal area, Buttocks, Right upper leg, Left upper leg  Bathing assist Assist Level: Touching or steadying assistance(Pt > 75%)      Upper Body Dressing/Undressing Upper body dressing   What is the patient wearing?: Pull over shirt/dress     Pull over shirt/dress - Perfomed by patient: Put head through opening, Thread/unthread left sleeve, Thread/unthread right sleeve, Pull shirt over trunk          Upper body assist Assist Level: Supervision or verbal cues      Lower Body Dressing/Undressing Lower body dressing   What is the patient wearing?: Pants, Underwear Underwear - Performed by patient: Pull underwear up/down Underwear - Performed by helper: Pull underwear up/down, Thread/unthread left underwear leg, Thread/unthread right underwear leg   Pants-  Performed by helper: Pull pants up/down, Thread/unthread left pants leg, Thread/unthread right pants leg                      Lower body assist Assist for lower body dressing: Touching or steadying assistance (Pt > 75%)      Toileting Toileting Toileting activity did not occur: Safety/medical concerns     Toileting Assistive Devices: Prosthesis/orthosis  Toileting assist     Transfers Chair/bed transfer   Chair/bed transfer  method: Lateral scoot Chair/bed transfer assist level: Moderate assist (Pt 50 - 74%/lift or lower) Chair/bed transfer assistive device: Sliding board     Locomotion Ambulation Ambulation activity did not occur: Safety/medical concerns         Wheelchair   Type: Manual Max wheelchair distance: 100 Assist Level: Supervision or verbal cues  Cognition Comprehension Comprehension assist level: Follows basic conversation/direction with extra time/assistive device  Expression Expression assist level: Expresses basic needs/ideas: With no assist  Social Interaction Social Interaction assist level: Interacts appropriately 90% of the time - Needs monitoring or encouragement for participation or interaction.  Problem Solving Problem solving assist level: Solves complex 90% of the time/cues < 10% of the time  Memory Memory assist level: Recognizes or recalls 90% of the time/requires cueing < 10% of the time    Medical Problem List and Plan: 1. Decreased mobility and self care secondary to Left AKA with prior hx of R BKA  -continue therapies  -stopped doxycycline as this was old, "reported" home med---no indication to continue at this time 2. DVT Prophylaxis/Anticoagulation: Pharmaceutical: Lovenox 3. Pain Management: Oxycodone IR, compression/massage/elevation 4. Mood: monitor for reactive depression 5. Neuropsych: This patient is capable of making decisions on his own behalf. 6. Skin/Wound Care: Dressing changes and ACE wrap to LLE. 7. Fluids/Electrolytes/Nutrition: Monitor I/Os, meal intake, may need dietary consult  Will cont to monitor periodically 8. HTN: norvasc, lisinopril, coreg, with prn hydralazine  Will cont to monitor and adjust as needed 9. ESRD: HD managed by Nephro M-W F. HD after therapies each day 10 Anemia of chronic disease:  Aranesp weekly in HD  With ABLA  Hb 10.2 on 1/20  Will cont to monitor periodically  11. NISCM/CAD:Lipitor , ASA, Coreg  LOS (Days) 3 A  FACE TO FACE EVALUATION WAS PERFORMED  Chelsee Hosie T 10/15/2015 8:03 AM

## 2015-10-16 ENCOUNTER — Inpatient Hospital Stay (HOSPITAL_COMMUNITY): Payer: Medicare Other

## 2015-10-16 ENCOUNTER — Inpatient Hospital Stay (HOSPITAL_COMMUNITY): Payer: Medicare Other | Admitting: Physical Therapy

## 2015-10-16 DIAGNOSIS — Z89619 Acquired absence of unspecified leg above knee: Secondary | ICD-10-CM | POA: Insufficient documentation

## 2015-10-16 DIAGNOSIS — I10 Essential (primary) hypertension: Secondary | ICD-10-CM | POA: Insufficient documentation

## 2015-10-16 DIAGNOSIS — Z89611 Acquired absence of right leg above knee: Secondary | ICD-10-CM | POA: Insufficient documentation

## 2015-10-16 LAB — RENAL FUNCTION PANEL
ALBUMIN: 2.3 g/dL — AB (ref 3.5–5.0)
ANION GAP: 16 — AB (ref 5–15)
BUN: 101 mg/dL — AB (ref 6–20)
CHLORIDE: 90 mmol/L — AB (ref 101–111)
CO2: 22 mmol/L (ref 22–32)
Calcium: 9 mg/dL (ref 8.9–10.3)
Creatinine, Ser: 11.74 mg/dL — ABNORMAL HIGH (ref 0.61–1.24)
GFR calc Af Amer: 4 mL/min — ABNORMAL LOW (ref >60)
GFR calc non Af Amer: 3 mL/min — ABNORMAL LOW (ref >60)
GLUCOSE: 117 mg/dL — AB (ref 65–99)
PHOSPHORUS: 5.8 mg/dL — AB (ref 2.5–4.6)
POTASSIUM: 6.1 mmol/L — AB (ref 3.5–5.1)
Sodium: 128 mmol/L — ABNORMAL LOW (ref 135–145)

## 2015-10-16 MED ORDER — LIDOCAINE HCL (PF) 1 % IJ SOLN: 5.0000 mL | INTRAMUSCULAR | Status: DC | PRN

## 2015-10-16 MED ORDER — HEPARIN SODIUM (PORCINE) 1000 UNIT/ML DIALYSIS: 1000.0000 [IU] | INTRAMUSCULAR | Status: DC | PRN

## 2015-10-16 MED ORDER — LIDOCAINE-PRILOCAINE 2.5-2.5 % EX CREA: 1.0000 "application " | TOPICAL_CREAM | CUTANEOUS | Status: DC | PRN

## 2015-10-16 MED ORDER — SODIUM CHLORIDE 0.9 % IV SOLN: 100.0000 mL | INTRAVENOUS | Status: DC | PRN

## 2015-10-16 MED ORDER — ALTEPLASE 2 MG IJ SOLR: 2.0000 mg | Freq: Once | INTRAMUSCULAR | Status: DC | PRN

## 2015-10-16 MED ORDER — HEPARIN SODIUM (PORCINE) 1000 UNIT/ML DIALYSIS: 20.0000 [IU]/kg | INTRAMUSCULAR | Status: DC | PRN

## 2015-10-16 MED ORDER — PENTAFLUOROPROP-TETRAFLUOROETH EX AERO: 1.0000 "application " | INHALATION_SPRAY | CUTANEOUS | Status: DC | PRN

## 2015-10-16 MED ORDER — HEPARIN SODIUM (PORCINE) 1000 UNIT/ML DIALYSIS: 3000.0000 [IU] | INTRAMUSCULAR | Status: DC | PRN

## 2015-10-16 NOTE — Progress Notes (Addendum)
[place] PHYSICAL MEDICINE & REHABILITATION     PROGRESS NOTE  Subjective/Complaints:   Pt slept well overnight he had 4/10 stump pain this AM, but resolved with medication.  He wants [place] sausage.    ROS: Denies CP, SOB, n/v/d.   Objective: Vital Signs: Blood pressure 137/61, pulse 73, temperature 98.5 F (36.9 C), temperature source Oral, resp. rate 18, height 5\' 9"  (1.753 m), weight 60.2 kg (132 lb 11.5 oz), SpO2 97 %. No results found.  Recent Labs  10/13/15 1518  WBC 6.5  HGB 9.8*  HCT 32.0*  PLT 206    Recent Labs  10/13/15 1518  NA 131*  K 4.7  CL 92*  GLUCOSE 133*  BUN 58*  CREATININE 10.86*  CALCIUM 8.9   CBG (last 3)  No results for input(s): GLUCAP in the last 72 hours.  Wt Readings from Last 3 Encounters:  10/16/15 60.2 kg (132 lb 11.5 oz)  10/12/15 59.9 kg (132 lb 0.9 oz)  10/04/15 54.432 kg (120 lb)    Physical Exam:  BP 137/61 mmHg  Pulse 73  Temp(Src) 98.5 F (36.9 C) (Oral)  Resp 18  Ht 5\' 9"  (1.753 m)  Wt 60.2 kg (132 lb 11.5 oz)  BMI 19.59 kg/m2  SpO2 97% Constitutional: He appears well-developed and well-nourished. NAD. Vital signs reviewed. HENT: Normocephalic and atraumatic. Right Ear: External ear normal. Left Ear: External ear normal.  Eyes: EOM are normal.   Cardiovascular: Normal rate and regular rhythm. Murmur heard. Respiratory: Effort normal and breath sounds normal. No respiratory distress.  GI: Soft. Bowel sounds are normal. He exhibits no distension. There is no tenderness.  Musculoskeletal: He exhibits ongoing tenderness to BKA site. Expected stump swelling.  Neurological: He is alert and oriented.  Speech clear.  Follows basic commands without difficulty.  B/l UE 5/5 Right hip flexion 5/5 Left hip flexion with pain inhibition  Skin: Skin is warm and dry. No rash noted. No erythema.  Left AKA with staples c/d/i. Limb warm,  Right BKA incision well healed and intact.  Psychiatric: He has a normal mood and  affect. His behavior is normal. Thought content normal.   Assessment/Plan: 1. 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 Physiatrist is providing close team supervision and 24 hour management of active medical problems listed below. Physiatrist and rehab team continue to assess barriers to discharge/monitor patient progress toward functional and medical goals.  Function:  Bathing Bathing position   Position: Wheelchair/chair at sink  Bathing parts Body parts bathed by patient: Right arm, Left arm, Chest, Abdomen, Front perineal area, Right upper leg, Left upper leg Body parts bathed by helper: Buttocks  Bathing assist Assist Level: Touching or steadying assistance(Pt > 75%)      Upper Body Dressing/Undressing Upper body dressing   What is the patient wearing?: Pull over shirt/dress     Pull over shirt/dress - Perfomed by patient: Put head through opening, Pull shirt over trunk, Thread/unthread right sleeve, Thread/unthread left sleeve          Upper body assist Assist Level: Supervision or verbal cues      Lower Body Dressing/Undressing Lower body dressing   What is the patient wearing?: Pants, Underwear Underwear - Performed by patient: Thread/unthread right underwear leg, Thread/unthread left underwear leg Underwear - Performed by helper: Pull underwear up/down Pants- Performed by patient: Thread/unthread right pants leg, Thread/unthread left  pants leg Pants- Performed by helper: Pull pants up/down                      Lower body assist Assist for lower body dressing: Touching or steadying assistance (Pt > 75%)      Toileting Toileting Toileting activity did not occur: Safety/medical concerns     Geologist, engineering Devices: Prosthesis/orthosis  Toileting assist     Transfers Chair/bed transfer   Chair/bed transfer method: Lateral  scoot Chair/bed transfer assist level: Touching or steadying assistance (Pt > 75%) Chair/bed transfer assistive device: Sliding board     Locomotion Ambulation Ambulation activity did not occur: Safety/medical concerns         Wheelchair   Type: Manual Max wheelchair distance: 150 Assist Level: Supervision or verbal cues  Cognition Comprehension Comprehension assist level: Follows basic conversation/direction with extra time/assistive device  Expression Expression assist level: Expresses basic needs/ideas: With no assist  Social Interaction Social Interaction assist level: Interacts appropriately 90% of the time - Needs monitoring or encouragement for participation or interaction.  Problem Solving Problem solving assist level: Solves basic 90% of the time/requires cueing < 10% of the time  Memory Memory assist level: Recognizes or recalls 90% of the time/requires cueing < 10% of the time    Medical Problem List and Plan: 1. 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 DVT Prophylaxis/Anticoagulation: Pharmaceutical: Lovenox 3. Pain Management: Oxycodone IR, compression/massage/elevation 4. Mood: monitor for reactive depression 5. Neuropsych: This patient is capable of making decisions on his own behalf. 6. Skin/Wound Care: Dressing changes and ACE wrap to LLE. 7. Fluids/Electrolytes/Nutrition: Monitor I/Os, meal intake, may need dietary consult 8. HTN: norvasc, lisinopril, coreg, with prn hydralazine  Relatively well controlled at present  Will cont to monitor and adjust as needed 9. ESRD: HD managed by [facility] HD after therapies each day 10 Anemia of chronic disease:  Aranesp weekly in HD  With ABLA  Hb 9.8 on 1/20  Will cont to monitor periodically 11. NISCM/CAD:Lipitor , ASA, Coreg  LOS (Days) 4 A FACE TO FACE EVALUATION WAS PERFORMED  [name] 10/16/2015 8:33 AM

## 2015-10-16 NOTE — Progress Notes (Signed)
Informed Dr Joelyn Oms of critical potassium of 6.1. Dr Joelyn Oms has ordered for the patient to continue the 2K dialysate bath for HD

## 2015-10-16 NOTE — Progress Notes (Addendum)
Physical Therapy Session Note  Patient Details  Name: Randy [name] MRN: [id number] Date of Birth: 1932/02/27  Today's Date: 10/16/2015 PT Individual Time: 1400-1500 PT Individual Time Calculation (min): 60 min   Short Term Goals: Week 1:  PT Short Term Goal 1 (Week 1): pt will perform functional transfers with min A PT Short Term Goal 2 (Week 1): pt will demo dyanmic sitting balance for functional task with min A  Skilled Therapeutic Interventions/Progress Updates:    Patient performed supine therex as noted below.  Supine to sit mod A for elevation of trunk and scooting to EOB.  Seated to don R LE prosthesis with min A esp for donning sock appropriate to allow fit of prosthesis.  Noted no tibial band pressure on the leg when not weight bearing.  Attempted sit to stand from bed max A but pt unable to elevate trunk.  Slide board transfer to w/c to L mod A for board placement and scooting with cues.  Patient propelled w/c to gym supervision x 150'.  Fitted chair with legrest for R LE for comfort.  Patient in parallel bars sit to stand max A and stood about 10 seconds with UE support and c/o pain R knee with weight on prosthesis.  Patient seated in w/c for armchair pushups and UE strength exercises as noted below.  Propelled to room and assisted with slide board transfer to R with min A.  Doffed prosthesis with S and noted no adverse effect on skin on R tibial tubercle.  Able to scoot around in bed and up in bed with bed rails and supervision. Left with all needs in reach and bed alarm active.  Therapy Documentation Precautions:  Precautions Precautions: Fall Restrictions Weight Bearing Restrictions: Yes LLE Weight Bearing: Non weight bearing Pain: Pain Assessment Pain Score: 6  Pain Type: Surgical pain;Acute pain Pain Location: Leg Pain Orientation: Left Pain Descriptors / Indicators: Grimacing;Aching Pain Onset: With Activity Pain Intervention(s): Distraction;Repositioned     Exercises: General Exercises - Upper Extremity Shoulder Flexion: Strengthening;10 reps;Seated;Bar weights/barbell Bar Weights/Barbell (Shoulder Flexion): 2 lbs Shoulder Extension: Strengthening;10 reps;Seated;Bar weights/barbell (leaning forward with A for balance) Bar Weights/Barbell (Shoulder Extension): 2 lbs Elbow Flexion: Strengthening;Both;10 reps;Seated;Bar weights/barbell Bar Weights/Barbell (Elbow Flexion): 2 lbs Amputee Exercises Hip Extension: Strengthening;Left;15 reps;Sidelying Hip ABduction/ADduction: Strengthening;Left;10 reps;Sidelying Chair Push Up: Strengthening;Both;10 reps;Seated Other Exercises Other Exercises: supine hip extension isometric x 10, 5 sec hold Other Exercises: supine hip adductor squeeze with pillow x 10 bilat 5 sec hold   See Function Navigator for Current Functional Status.   Therapy/Group: Individual Therapy  H[place], [place] 10/16/2015  10/16/2015, 5:24 PM

## 2015-10-16 NOTE — Progress Notes (Signed)
Physical Therapy Session Note  Patient Details  Name: Randy [name] MRN: [id number] Date of Birth: 1932/05/20  Today's Date: 10/16/2015 PT Individual Time: [id number] PT Individual Time Calculation (min): 46 min   Short Term Goals: Week 1:  PT Short Term Goal 1 (Week 1): pt will perform functional transfers with min A PT Short Term Goal 2 (Week 1): pt will demo dyanmic sitting balance for functional task with min A  Skilled Therapeutic Interventions/Progress Updates:   Pt received in bed resting; no c/o pain at rest but LLE noted to not be wrapped with ace bandage.  Discussed with pt goals of therapy, PLOF, home set up, equipment and assistance available at D/C and transportation options.  Pt reports using RW and prosthesis in the home and w/c or scooter in community but reports that his prosthesis has been rubbing an sore on the front of his RLE; will contact P&O to assess fit.  Pt engaged in bed mobility-rolling to R <> Supine and side <> sit ax 2 reps while donning and providing education about appropriate donning of Ace bandage around waist for anchor, pt also educated on desensitization techniques for LLE due to hypersensitivity to touch/pressure; pt required mod A overall for bed mobility to maintain forward rotation of L pelvis and trunk and forwards weight shifting during side > sit.  While sitting EOB pt required min A at all times for balance and to prevent posterior LOB.  Performed transfer training bed > w/c and w/c <> simulated car with slideboard to L and R with mod A overall with constant assistance required to maintain forwards lean and weight shift and use of head-hips relationship to advance across board.  Discussed with pt use of prosthesis during transfers to assist with balance and safety-pt agreeable to attempt this pm.  Returned to room and pt set up in w/c with all items within reach to await OT for B&D.    Therapy Documentation Precautions:  Precautions Precautions:  Fall Restrictions Weight Bearing Restrictions: Yes LLE Weight Bearing: Non weight bearing Pain: Pain Assessment Pain Assessment: 0-10 Pain Score: 5  Pain Type: Surgical pain;Acute pain Pain Location: Leg Pain Orientation: Left Pain Descriptors / Indicators: Sore;Throbbing Pain Frequency: Constant Pain Onset: On-going Patients Stated Pain Goal: 2 Pain Intervention(s): Other (Comment) (ace wrapped, desensitization techniques) Multiple Pain Sites: Yes 2nd Pain Site Pain Score: 5 Pain Type: Acute pain Pain Location: Wrist Pain Orientation: Right;Mid Pain Frequency: Constant Pain Onset: Unable to tell Patient's Stated Pain Goal: 2 Pain Intervention(s): Massage;Other (Comment) (compression via ace wrap)   See Function Navigator for Current Functional Status.   Therapy/Group: Individual Therapy  [name] [facility] 10/16/2015, 11:51 AM

## 2015-10-16 NOTE — Progress Notes (Signed)
Subjective:  No co now/ for HD today  Objective Vital signs in last 24 hours: Filed Vitals:   10/15/15 1336 10/15/15 1828 10/15/15 2122 10/16/15 0512  BP: 128/55 124/68 138/50 137/61  Pulse: 78  76 73  Temp: 98.5 F (36.9 C)   98.5 F (36.9 C)  TempSrc: Oral   Oral  Resp: 18   18  Height:      Weight:    60.2 kg (132 lb 11.5 oz)  SpO2: 97%   97%   Weight change: 0.3 kg (10.6 oz)  Physical Exam: General=Alert elderly BM / NAD / OX 3 Chest = CTA  bilat  Card =RRR no mrg Abd= soft ntnd  Ext =L AKA and R BKA,No edema HD access= pos. Bruit  LFA AVF   OP HD= [facility] MWF 3.5h 62kg 2/2.25 bath LFA AVF Hep 6600  Mircera 100 q 2wks last 1/11 Calc 0.5 ug:    Problem/Plan: 1 SP L AKA 1/17= on rehab plans to return home  2 Debility= on rehab 3 ESRD =MWF on schedule, [facility] OP  4 Vol / HTN =2kg under dry needs hoyer lift wts/ 3  bp meds  Monitor bp  With HD  6 MBD= correc. ca 10.3 = Hold  Vit d on hd /3 renvela binder / phos 6.6 fu trend  7 Anemia =cont esa/ Fe   Ernest Haber, PA-C [place] [phone] 10/16/2015,11:31 AM  LOS: 4 days   Pt seen, examined and agree w A/P as above.  Kelly Splinter MD [facility] pager [phone]    cell [phone] 10/16/2015, 12:06 PM    Labs: Basic Metabolic Panel:  Recent Labs Lab 10/11/15 0830 10/11/15 1330 10/12/15 0250 10/13/15 1518  NA  --  137 132* 131*  K  --  3.7 4.7 4.7  CL  --  97* 93* 92*  CO2  --  29 27 22   GLUCOSE  --  130* 116* 133*  BUN  --  17 29* 58*  CREATININE  --  5.07* 6.75* 10.86*  CALCIUM  --  9.0 9.0 8.9  PHOS 3.6 4.2  --  6.6*   Liver Function Tests:  Recent Labs Lab 10/10/15 0640 10/11/15 1330 10/13/15 1518  AST 18  --   --   ALT 9*  --   --   ALKPHOS 56  --   --   BILITOT 0.6  --   --   PROT 6.6  --   --   ALBUMIN 2.8* 2.5* 2.2*  CBC:  Recent Labs Lab 10/10/15 0640 10/11/15 0329 10/11/15 1330 10/12/15 0250 10/13/15 1518  WBC  6.6 8.2 7.8 6.7 6.5  HGB 11.3* 10.7* 11.7* 10.2* 9.8*  HCT 38.2* 35.5* 39.2 34.3* 32.0*  MCV 99.5 98.6 98.0 97.4 96.1  PLT 231 242 219 192 206  CBG:  Recent Labs Lab 10/11/15 1611 10/11/15 2057 10/12/15 0610 10/12/15 1057 10/12/15 1611  GLUCAP 183* 105* 102* 133* 111*    Studies/Results: No results found. Medications: 3 bp me   . amLODipine  10 mg Oral QHS  . aspirin  81 mg Oral Daily  . atorvastatin  40 mg Oral q1800  . calcitRIOL  0.5 mcg Oral Daily  . carvedilol  25 mg Oral BID WC  . [START ON 10/18/2015] darbepoetin (ARANESP) injection - DIALYSIS  100 mcg Intravenous Q Wed-HD  . enoxaparin (LOVENOX) injection  30 mg Subcutaneous Q24H  . feeding supplement (NEPRO CARB STEADY)  237 mL  Oral BID BM  . ferric gluconate (FERRLECIT/NULECIT) IV  125 mg Intravenous Q M,W,F-HD  . gabapentin  100 mg Oral QHS  . isosorbide mononitrate  15 mg Oral QHS  . latanoprost  1 drop Both Eyes QHS  . lisinopril  10 mg Oral QHS  . multivitamin  1 tablet Oral QHS  . oxyCODONE  10 mg Oral QHS  . pantoprazole  40 mg Oral Daily  . senna-docusate  2 tablet Oral QHS  . sevelamer carbonate  2,400 mg Oral TID WC

## 2015-10-16 NOTE — Progress Notes (Signed)
Occupational Therapy Session Note  Patient Details  Name: Randy [name] MRN: [id number] Date of Birth: [birth year]  Today's Date: 10/16/2015 OT Individual Time: [id number] OT Individual Time Calculation (min): 75 min    Short Term Goals: Week 1:  OT Short Term Goal 1 (Week 1):  Pt. will perform LB bathing with SBA for lateral and forward leaning OT Short Term Goal 2 (Week 1): Pt. will be LB dressing with SBA OT Short Term Goal 3 (Week 1): Pt. will transfer to [facility] with min assist. OT Short Term Goal 4 (Week 1): Pt. will transfer to tub bench with min assist  Skilled Therapeutic Interventions/Progress Updates: ADL-retraining seated at sink, w/c level, with focus on improved activity tolerance, and adapted bathing/dressing skills.   Pt received reporting fatigue from physical therapy session but remained receptive for treatment, although declining shower.   Pt completed upper body BADL with only setup assist and agreed to attempt transfer to tub bench to rehearse prior to taking shower.   With setup and extra time to provide prosthesis pt attempts stand-pivot transfer to bench but requires max assist to stand and reports onset of right wrist pain and severe fatigue as preventing movement toward bench.   Pt advised to return to bed to allow OT to assess wrist, provide ace wrap, and practice slide board transfer using prosthesis to assist with lateral scooting.  Pt completed transfer to bed with overall min assist and instructional cues on technique.   Pt left in bed at end of session with all needs placed within reach.     Therapy Documentation Precautions:  Precautions Precautions: Fall Restrictions Weight Bearing Restrictions: Yes LLE Weight Bearing: Non weight bearing  Pain: Pain Assessment Pain Assessment: 0-10 Pain Score: 5  Pain Type: Acute pain Pain Location: Leg Pain Orientation: Left Pain Descriptors / Indicators: Aching Pain Frequency: Constant Pain Onset: On-going Patients  Stated Pain Goal: 2 Pain Intervention(s): Cold applied;Repositioned;Distraction;Elevated extremity Multiple Pain Sites: Yes 2nd Pain Site Pain Score: 5 Pain Type: Acute pain Pain Location: Wrist Pain Orientation: Right;Mid Pain Frequency: Constant Pain Onset: Unable to tell Patient's Stated Pain Goal: 2 Pain Intervention(s): Massage;Other (Comment) (compression via ace wrap)  See Function Navigator for Current Functional Status.   Therapy/Group: Individual Therapy  Marya Lowden 10/16/2015, 10:44 AM

## 2015-10-17 ENCOUNTER — Inpatient Hospital Stay (HOSPITAL_COMMUNITY): Payer: Medicare Other

## 2015-10-17 ENCOUNTER — Inpatient Hospital Stay (HOSPITAL_COMMUNITY): Payer: Medicare Other | Admitting: Occupational Therapy

## 2015-10-17 MED ORDER — ISOSORBIDE MONONITRATE ER 30 MG PO TB24
30.0000 mg | ORAL_TABLET | Freq: Every day | ORAL | Status: DC
  Administered 2015-10-17 – 2015-10-20 (×4): 30 mg via ORAL
  Filled 2015-10-17 (×4): qty 1

## 2015-10-17 MED ORDER — OXYCODONE HCL ER 10 MG PO T12A
10.0000 mg | EXTENDED_RELEASE_TABLET | Freq: Every day | ORAL | Status: DC
  Administered 2015-10-17 – 2015-10-18 (×2): 10 mg via ORAL
  Filled 2015-10-17 (×2): qty 1

## 2015-10-17 NOTE — Progress Notes (Signed)
Physical Therapy Session Note  Patient Details  Name: Randy [name] MRN: [id number] Date of Birth: 05-30-1932  Today's Date: 10/17/2015 PT Individual Time: 0830-0930 PT Individual Time Calculation (min): 60 min   Short Term Goals: Week 1:  PT Short Term Goal 1 (Week 1): pt will perform functional transfers with min A PT Short Term Goal 2 (Week 1): pt will demo dyanmic sitting balance for functional task with min A  Skilled Therapeutic Interventions/Progress Updates:    Patient received in bed c/o sore neck due to sleeping wrong and already fatigued.  Performed rolling and side to sit with min A, cues.  Patient scoot to EOB min/mod A.  Seated EOB for AROM//AAROM cervical due to c/o tight with pain both sides of neck.  Donned prosthesis with mod A.  Performed SBT to w/c min A after board placement.  Patient propelled w/c x 25' then requested to return to bathroom for BM.  Assisted with SBT to/from toilet with mod A and assist for all three tasks for toileting.  Patient had soiled pants so assisted with doffing pants and donning clean underwear and gown.  Patient seated in w/c for STM and heat applied to cervical muscles end of session with pt left in w/c with all needs in reach.  Therapy Documentation Precautions:  Precautions Precautions: Fall Restrictions Weight Bearing Restrictions: Yes LLE Weight Bearing: Non weight bearing Pain: Pain Assessment Pain Score: 3  Pain Type: Acute pain Pain Orientation: Posterior Pain Intervention(s): Back rub;Ambulation/increased activity;Heat applied :     See Function Navigator for Current Functional Status.   Therapy/Group: Individual Therapy  [place], [place] 10/17/2015  10/17/2015, 12:24 PM

## 2015-10-17 NOTE — Progress Notes (Signed)
   VASCULAR SURGERY ASSESSMENT & PLAN:  * POD 7 S/P L AKA  *  Steady progress in rehab.   SUBJECTIVE: No complaints.  PHYSICAL EXAM: Filed Vitals:   10/16/15 2256 10/17/15 0500 10/17/15 0613 10/17/15 1347  BP: 155/64  130/65 109/58  Pulse: 91  80 82  Temp: 99.5 F (37.5 C)  98.9 F (37.2 C) 99 F (37.2 C)  TempSrc: Oral  Oral Oral  Resp: 18  16 17   Height:      Weight:  130 lb 4.7 oz (59.1 kg)    SpO2: 96%  99% 95%   Left AKA inspected and looks good.   Active Problems:   Unilateral AKA (HCC)   S/P AKA (above knee amputation) unilateral ([place])   History of right above knee amputation ([place])   Benign essential HTN  [name] Beeper: [id number] 10/17/2015  '

## 2015-10-17 NOTE — Progress Notes (Signed)
Occupational Therapy Session Note  Patient Details  Name: Randy [name] MRN: [id number] Date of Birth: October 28, 1931  Today's Date: 10/17/2015 OT Individual [phone] 60 Min      Short Term Goals: Week 1:  OT Short Term Goal 1 (Week 1):  Pt. will perform LB bathing with SBA for lateral and forward leaning OT Short Term Goal 2 (Week 1): Pt. will be LB dressing with SBA OT Short Term Goal 3 (Week 1): Pt. will transfer to [facility] with min assist. OT Short Term Goal 4 (Week 1): Pt. will transfer to tub bench with min assist  Skilled Therapeutic Interventions/Progress Updates: ADL-retraining at shower level with focus on slide board transfers using RLE prosthesis, activity tolerance, and bed mobility.   With setup to provide prosthesis, extra time, and mod assist to position w/c, pt completes transfer to tub bench and showered seated unassisted using lateral leans to wash buttocks.  Pt fatigued from shower and reported possible irritation at buttocks to OT; unable to inspect.   Pt required mod assist to return to w/c from tub bench but only min assist to transfer back to bed.  RN made aware of sore on buttocks.   Pt required max assist to don pants while supine d/t fatigued and pain at LLE when rolling left and right.  Pt left with RN attending at end of session.     Therapy Documentation Precautions:  Precautions Precautions: Fall Restrictions Weight Bearing Restrictions: Yes LLE Weight Bearing: Non weight bearing General:   Vital Signs: Therapy Vitals Temp: 98.9 F (37.2 C) Temp Source: Oral Pulse Rate: 80 Resp: 16 BP: 130/65 mmHg Patient Position (if appropriate): Lying Oxygen Therapy SpO2: 99 % O2 Device: Not Delivered Pain: Pain Assessment Pain Assessment: 0-10 Pain Score: 8  Pain Type: Acute pain Pain Location: Neck Pain Orientation: Posterior Pain Descriptors / Indicators: Aching Pain Frequency: Rarely Pain Onset: Gradual Patients Stated Pain Goal: 1 Pain  Intervention(s): Medication (See eMAR) ADL:   Exercises:   Other Treatments:    See Function Navigator for Current Functional Status.   Therapy/Group: Individual Therapy  Burnette Valenti 10/17/2015, 7:04 AM

## 2015-10-17 NOTE — Progress Notes (Signed)
Occupational Therapy Session Note  Patient Details  Name: Randy [name] MRN: [id number] Date of Birth: [birth year]  Today's Date: 10/17/2015 OT Individual Time: 1117-1200 and 1530-1556 OT Individual Time Calculation (min): 43 min and 26 min    Short Term Goals: Week 1:  OT Short Term Goal 1 (Week 1):  Pt. will perform LB bathing with SBA for lateral and forward leaning OT Short Term Goal 2 (Week 1): Pt. will be LB dressing with SBA OT Short Term Goal 3 (Week 1): Pt. will transfer to [facility] with min assist. OT Short Term Goal 4 (Week 1): Pt. will transfer to tub bench with min assist  Skilled Therapeutic Interventions/Progress Updates:  Session 1: Upon entering the room, pt supine in bed with 7/10 c/o R phantom leg pain. OT educated pt on lightly tapping and rubbing the area in order to decrease pain. Pt returned demonstration. Also discussed use of dry erase board to keep up with when pain medications are due. Pt required coaxing for participation this session with focus on supine <>sit without bed rails, rolling L<>R , and seated balance on EOB. Pt requiring manual facilitation and min verbal cues for proper technique . Pt seated on EOB with B UEs unsupported with steady assistance for 15 minutes this session. His daughter present in room this session as well for education on OT purpose, POC, and goals. Pt returned to supine and utilizing B UEs to scoot back towards head of bed. Pt needing rest breaks for fatigue this session. Call bell and all needed items within reach.   Session 2: Upon entering the room, pt supine in bed with 0/10 c/o pain but complaining of fatigue. Pt requiring coaxing for participation this session and declined out of bed activities. OT educated and demonstrated use of orange, level 2 theraband, for B UE strengthening for functional transfers. Pt returning demonstration for 2 sets of 10 of chest pulls, bicep curls, shoulder flexion, and alternating punches with min verbal cues  for proper technique. Pt remained in bed with call bell and all needed items within reach upon exiting the room.   Therapy Documentation Precautions:  Precautions Precautions: Fall Restrictions Weight Bearing Restrictions: Yes LLE Weight Bearing: Non weight bearing  See Function Navigator for Current Functional Status.   Therapy/Group: Individual Therapy  [name] 10/17/2015, 12:53 PM

## 2015-10-17 NOTE — Progress Notes (Signed)
[place] PHYSICAL MEDICINE & REHABILITATION     PROGRESS NOTE  Subjective/Complaints:   Pt states he slept well overnight, but may have slept too well.  He has a "crink" in his neck this AM.    ROS: Denies CP, SOB, n/v/d.   Objective: Vital Signs: Blood pressure 130/65, pulse 80, temperature 98.9 F (37.2 C), temperature source Oral, resp. rate 16, height 5\' 9"  (1.753 m), weight 59.1 kg (130 lb 4.7 oz), SpO2 99 %. No results found. No results for input(s): WBC, HGB, HCT, PLT in the last 72 hours.  Recent Labs  10/16/15 1935  NA 128*  K 6.1*  CL 90*  GLUCOSE 117*  BUN 101*  CREATININE 11.74*  CALCIUM 9.0   CBG (last 3)  No results for input(s): GLUCAP in the last 72 hours.  Wt Readings from Last 3 Encounters:  10/17/15 59.1 kg (130 lb 4.7 oz)  10/12/15 59.9 kg (132 lb 0.9 oz)  10/04/15 54.432 kg (120 lb)    Physical Exam:  BP 130/65 mmHg  Pulse 80  Temp(Src) 98.9 F (37.2 C) (Oral)  Resp 16  Ht 5\' 9"  (1.753 m)  Wt 59.1 kg (130 lb 4.7 oz)  BMI 19.23 kg/m2  SpO2 99% Constitutional: He appears well-developed and well-nourished. NAD. Vital signs reviewed. HENT: Normocephalic and atraumatic. Right Ear: External ear normal. Left Ear: External ear normal.  Eyes: EOM are normal.   Cardiovascular: Normal rate and regular rhythm. Murmur heard. Respiratory: Effort normal and breath sounds normal. No respiratory distress.  GI: Soft. Bowel sounds are normal. He exhibits no distension. There is no tenderness.  Musculoskeletal: He exhibits ongoing tenderness to BKA site. Expected stump swelling.  Neurological: He is alert and oriented.  Speech clear.  Follows basic commands without difficulty.  B/l UE 5/5 Right hip flexion 5/5 Left hip flexion with pain inhibition  Skin: Skin is warm and dry. No rash noted. No erythema.  Left AKA with dressing c/d/i.  Right BKA incision well healed and intact.  Psychiatric: He has a normal mood and affect. His behavior is normal.  Thought content normal.   Assessment/Plan: 1. 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 Physiatrist is providing close team supervision and 24 hour management of active medical problems listed below. Physiatrist and rehab team continue to assess barriers to discharge/monitor patient progress toward functional and medical goals.  Function:  Bathing Bathing position   Position: Wheelchair/chair at sink  Bathing parts Body parts bathed by patient: Right arm, Left arm, Chest, Abdomen Body parts bathed by helper: Buttocks  Bathing assist Assist Level: Set up   Set up : To obtain items  Upper Body Dressing/Undressing Upper body dressing   What is the patient wearing?: Pull over shirt/dress     Pull over shirt/dress - Perfomed by patient: Thread/unthread right sleeve, Thread/unthread left sleeve, Put head through opening, Pull shirt over trunk          Upper body assist Assist Level: Set up   Set up : To obtain clothing/put away  Lower Body Dressing/Undressing Lower body dressing   What is the patient wearing?: Pants, Underwear Underwear - Performed by patient: Thread/unthread right underwear leg, Thread/unthread left underwear leg Underwear - Performed by helper: Pull underwear up/down Pants- Performed by patient: Thread/unthread right pants leg, Thread/unthread left pants leg Pants- Performed by helper: Pull pants up/down  Lower body assist Assist for lower body dressing: Touching or steadying assistance (Pt > 75%)      Toileting Toileting Toileting activity did not occur: Safety/medical concerns     Toileting Assistive Devices: Prosthesis/orthosis  Toileting assist     Transfers Chair/bed transfer   Chair/bed transfer method: Lateral scoot Chair/bed transfer assist level: Moderate assist (Pt 50 - 74%/lift or  lower) Chair/bed transfer assistive device: Armrests, Sliding board, Prosthesis     Locomotion Ambulation Ambulation activity did not occur: Safety/medical concerns         Wheelchair   Type: Manual Max wheelchair distance: 150 Assist Level: Supervision or verbal cues  Cognition Comprehension Comprehension assist level: Follows basic conversation/direction with extra time/assistive device  Expression Expression assist level: Expresses basic needs/ideas: With no assist  Social Interaction Social Interaction assist level: Interacts appropriately 90% of the time - Needs monitoring or encouragement for participation or interaction.  Problem Solving Problem solving assist level: Solves basic problems with no assist  Memory Memory assist level: Recognizes or recalls 90% of the time/requires cueing < 10% of the time    Medical Problem List and Plan: 1. 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 DVT Prophylaxis/Anticoagulation: Pharmaceutical: Lovenox 3. Pain Management: Oxycodone IR, compression/massage/elevation 4. Mood: monitor for reactive depression 5. Neuropsych: This patient is capable of making decisions on his own behalf. 6. Skin/Wound Care: Dressing changes and ACE wrap to LLE. 7. Fluids/Electrolytes/Nutrition: Monitor I/Os, meal intake, may need dietary consult  Eating 80-100% of meals 8. HTN: norvasc, lisinopril, coreg, imdur, with prn hydralazine  Imdur increased on 1/24  Will cont to monitor and adjust as needed 9. ESRD: HD managed by [facility]. HD after therapies each day 10 Anemia of chronic disease:  Aranesp weekly in HD  With ABLA  Hb 9.8 on 1/20  Will cont to monitor periodically 11. NISCM/CAD:Lipitor , ASA, Coreg  LOS (Days) 5 A FACE TO FACE EVALUATION WAS PERFORMED  [name] 10/17/2015 8:10 AM

## 2015-10-18 ENCOUNTER — Inpatient Hospital Stay (HOSPITAL_COMMUNITY): Payer: Medicare Other

## 2015-10-18 ENCOUNTER — Inpatient Hospital Stay (HOSPITAL_COMMUNITY): Payer: Medicare Other | Admitting: Physical Therapy

## 2015-10-18 ENCOUNTER — Inpatient Hospital Stay (HOSPITAL_COMMUNITY): Payer: Medicare Other | Admitting: Occupational Therapy

## 2015-10-18 DIAGNOSIS — G8918 Other acute postprocedural pain: Secondary | ICD-10-CM

## 2015-10-18 LAB — BASIC METABOLIC PANEL
ANION GAP: 14 (ref 5–15)
BUN: 72 mg/dL — ABNORMAL HIGH (ref 6–20)
CHLORIDE: 94 mmol/L — AB (ref 101–111)
CO2: 26 mmol/L (ref 22–32)
CREATININE: 8.9 mg/dL — AB (ref 0.61–1.24)
Calcium: 9.2 mg/dL (ref 8.9–10.3)
GFR calc non Af Amer: 5 mL/min — ABNORMAL LOW (ref >60)
GFR, EST AFRICAN AMERICAN: 6 mL/min — AB (ref >60)
Glucose, Bld: 124 mg/dL — ABNORMAL HIGH (ref 65–99)
POTASSIUM: 5.2 mmol/L — AB (ref 3.5–5.1)
SODIUM: 134 mmol/L — AB (ref 135–145)

## 2015-10-18 MED ORDER — CAPSAICIN 0.075 % EX CREA
TOPICAL_CREAM | Freq: Two times a day (BID) | CUTANEOUS | Status: DC | PRN
  Administered 2015-10-18 – 2015-10-19 (×2): via TOPICAL
  Filled 2015-10-18: qty 60

## 2015-10-18 MED ORDER — DOCUSATE SODIUM 283 MG RE ENEM
1.0000 | ENEMA | Freq: Every day | RECTAL | Status: DC | PRN
  Filled 2015-10-18: qty 1

## 2015-10-18 NOTE — Progress Notes (Signed)
Occupational Therapy Session Note  Patient Details  Name: Randy [name] MRN: [id number] Date of Birth: 1931-11-20  Today's Date: 10/18/2015 OT Individual Time: 1004-1100 OT Individual Time Calculation (min): 56 min    Short Term Goals: Week 1:  OT Short Term Goal 1 (Week 1):  Pt. will perform LB bathing with SBA for lateral and forward leaning OT Short Term Goal 2 (Week 1): Pt. will be LB dressing with SBA OT Short Term Goal 3 (Week 1): Pt. will transfer to [facility] with min assist. OT Short Term Goal 4 (Week 1): Pt. will transfer to tub bench with min assist  Skilled Therapeutic Interventions/Progress Updates:    Pt seen for OT ADL bathing and dressing session. Pt completing BSC transfer with PT upon arrival. He voiced increased fatigue, however, agreeable to bathing at w/c level, declining showering task. He bathed UB seated at sink with set-up. Educated regarding lateral leans for buttock hygiene. Set pt up for lateral leans from w/c onto bed and onto chair placed next to w/c. He required increased encouragement to attempt lateral leans for hygiene/ clothing management. He was able to complete buttock hygiene via lateral leans, however, required increased assist with pulling pants over hips. Pt very quick to ask for assist before attempting task independently. Educated regarding OT goals and importance of participation/ increased independence with ADL tasks.  Practiced w/c mobility in simulated home environment with pt required to maneuver w/c through cones set up as obstacles focusing on w/c management and sharp turns. Pt able to completed at overall supervision level.  He requested return to bed at end of session. Mod A sliding board completed to bed. He required min-mod VCs for proper set-up of chair in prep for sliding board transfer and assist for placing board.  Pt left in supine at end of session, all needs in reach.   Therapy Documentation Precautions:  Precautions Precautions:  Fall Restrictions Weight Bearing Restrictions: Yes LLE Weight Bearing: Non weight bearing Pain:   No/ denies pain  See Function Navigator for Current Functional Status.   Therapy/Group: Individual Therapy  Lewis, Heaven Meeker C 10/18/2015, 7:08 AM

## 2015-10-18 NOTE — Progress Notes (Signed)
Subjective:  Up in [place] bathing himself, no SOB/ cough of other issues. Last HD Monday with 2kg off.   Objective Vital signs in last 24 hours: Filed Vitals:   10/17/15 1347 10/17/15 2050 10/18/15 0500 10/18/15 0610  BP: 109/58 125/55  139/73  Pulse: 82 81  83  Temp: 99 F (37.2 C)   98 F (36.7 C)  TempSrc: Oral   Oral  Resp: 17     Height:      Weight:   60.5 kg (133 lb 6.1 oz)   SpO2: 95%   96%   Weight change: -1.9 kg (-4 lb 3 oz)  Physical Exam: General=Alert elderly BM / NAD / OX 3 Chest = CTA  bilat  Card =RRR no mrg Abd= soft ntnd  Ext =L AKA and R BKA,No edema HD access= pos. 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 ca 10.3 = Hold  Vit d on hd /3 renvela binder / phos 6.6 fu trend  7 Anemia =cont esa/ Fe  8 Hyperkalemia repeat today  Plan - HD today. Decrease BP meds.    Kelly Splinter MD [facility] pager [phone]    cell [phone] 10/18/2015, 10:13 AM    Labs: Basic Metabolic Panel:  Recent Labs Lab 10/11/15 1330 10/12/15 0250 10/13/15 1518 10/16/15 1935  NA 137 132* 131* 128*  K 3.7 4.7 4.7 6.1*  CL 97* 93* 92* 90*  CO2 29 27 22 22   GLUCOSE 130* 116* 133* 117*  BUN 17 29* 58* 101*  CREATININE 5.07* 6.75* 10.86* 11.74*  CALCIUM 9.0 9.0 8.9 9.0  PHOS 4.2  --  6.6* 5.8*   Liver Function Tests:  Recent Labs Lab 10/11/15 1330 10/13/15 1518 10/16/15 1935  ALBUMIN 2.5* 2.2* 2.3*  CBC:  Recent Labs Lab 10/11/15 1330 10/12/15 0250 10/13/15 1518  WBC 7.8 6.7 6.5  HGB 11.7* 10.2* 9.8*  HCT 39.2 34.3* 32.0*  MCV 98.0 97.4 96.1  PLT 219 192 206  CBG:  Recent Labs Lab 10/11/15 1611 10/11/15 2057 10/12/15 0610 10/12/15 1057  10/12/15 1611  GLUCAP 183* 105* 102* 133* 111*    Studies/Results: No results found. Medications: 3 bp me   . amLODipine  10 mg Oral QHS  . aspirin  81 mg Oral Daily  . atorvastatin  40 mg Oral q1800  . carvedilol  25 mg Oral BID WC  . darbepoetin (ARANESP) injection - DIALYSIS  100 mcg Intravenous Q Wed-HD  . enoxaparin (LOVENOX) injection  30 mg Subcutaneous Q24H  . feeding supplement (NEPRO CARB STEADY)  237 mL Oral BID BM  . gabapentin  100 mg Oral QHS  . isosorbide mononitrate  30 mg Oral QHS  . latanoprost  1 drop Both Eyes QHS  . lisinopril  10 mg Oral QHS  . multivitamin  1 tablet Oral QHS  . oxyCODONE  10 mg Oral QHS  . pantoprazole  40 mg Oral Daily  . senna-docusate  2 tablet Oral QHS  . sevelamer carbonate  2,400 mg Oral TID WC

## 2015-10-18 NOTE — Progress Notes (Signed)
[place] PHYSICAL MEDICINE & REHABILITATION     PROGRESS NOTE  Subjective/Complaints:   Pt slept well.  He notes occasional pain from his stump site, but overall his pain is fairly well controlled.    ROS: Denies CP, SOB, n/v/d.   Objective: Vital Signs: Blood pressure 139/73, pulse 83, temperature 98 F (36.7 C), temperature source Oral, resp. rate 17, height 5\' 9"  (1.753 m), weight 60.5 kg (133 lb 6.1 oz), SpO2 96 %. No results found. No results for input(s): WBC, HGB, HCT, PLT in the last 72 hours.  Recent Labs  10/16/15 1935  NA 128*  K 6.1*  CL 90*  GLUCOSE 117*  BUN 101*  CREATININE 11.74*  CALCIUM 9.0   CBG (last 3)  No results for input(s): GLUCAP in the last 72 hours.  Wt Readings from Last 3 Encounters:  10/18/15 60.5 kg (133 lb 6.1 oz)  10/12/15 59.9 kg (132 lb 0.9 oz)  10/04/15 54.432 kg (120 lb)    Physical Exam:  BP 139/73 mmHg  Pulse 83  Temp(Src) 98 F (36.7 C) (Oral)  Resp 17  Ht 5\' 9"  (1.753 m)  Wt 60.5 kg (133 lb 6.1 oz)  BMI 19.69 kg/m2  SpO2 96% Constitutional: He appears well-developed and well-nourished. NAD. Vital signs reviewed. HENT: Normocephalic and atraumatic. Right Ear: External ear normal. Left Ear: External ear normal.  Eyes: EOM are normal.   Cardiovascular: Normal rate and regular rhythm. Murmur heard. Respiratory: Effort normal and breath sounds normal. No respiratory distress.  GI: Soft. Bowel sounds are normal. He exhibits no distension. There is no tenderness.  Musculoskeletal: He exhibits ongoing tenderness to BKA site with palpation. Expected stump swelling (improving).  Neurological: He is alert and oriented.  Speech clear.  Follows basic commands without difficulty.  B/l UE 5/5 Right hip flexion 5/5 Left hip flexion 5/5  Skin: Skin is warm and dry. No rash noted. No erythema.  Left AKA incision c/d/i.  Right BKA incision well healed and intact.  Psychiatric: He has a normal mood and affect. His behavior is  normal. Thought content normal.   Assessment/Plan: 1. 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 Physiatrist is providing close team supervision and 24 hour management of active medical problems listed below. Physiatrist and rehab team continue to assess barriers to discharge/monitor patient progress toward functional and medical goals.  Function:  Bathing Bathing position   Position: Wheelchair/chair at sink  Bathing parts Body parts bathed by patient: Right arm, Left arm, Chest, Abdomen Body parts bathed by helper: Buttocks  Bathing assist Assist Level: Set up   Set up : To obtain items  Upper Body Dressing/Undressing Upper body dressing   What is the patient wearing?: Pull over shirt/dress     Pull over shirt/dress - Perfomed by patient: Thread/unthread right sleeve, Thread/unthread left sleeve, Put head through opening, Pull shirt over trunk          Upper body assist Assist Level: Set up   Set up : To obtain clothing/put away  Lower Body Dressing/Undressing Lower body dressing   What is the patient wearing?: Pants, Underwear Underwear - Performed by patient: Thread/unthread right underwear leg, Thread/unthread left underwear leg Underwear - Performed by helper: Pull underwear up/down Pants- Performed by patient: Thread/unthread right pants leg, Thread/unthread left pants leg Pants- Performed by helper: Pull pants up/down  Lower body assist Assist for lower body dressing: Touching or steadying assistance (Pt > 75%)      Toileting Toileting Toileting activity did not occur: Safety/medical concerns   Toileting steps completed by helper: Adjust clothing prior to toileting, Performs perineal hygiene, Adjust clothing after toileting Toileting Assistive Devices: Prosthesis/orthosis  Toileting assist      Transfers Chair/bed transfer   Chair/bed transfer method: Lateral scoot Chair/bed transfer assist level: Touching or steadying assistance (Pt > 75%) Chair/bed transfer assistive device: Prosthesis, Sliding board, Armrests     Locomotion Ambulation Ambulation activity did not occur: Safety/medical concerns         Wheelchair   Type: Manual Max wheelchair distance: 50 Assist Level: Supervision or verbal cues  Cognition Comprehension Comprehension assist level: Follows basic conversation/direction with extra time/assistive device  Expression Expression assist level: Expresses basic needs/ideas: With no assist  Social Interaction Social Interaction assist level: Interacts appropriately 90% of the time - Needs monitoring or encouragement for participation or interaction.  Problem Solving Problem solving assist level: Solves basic problems with no assist  Memory Memory assist level: Recognizes or recalls 90% of the time/requires cueing < 10% of the time    Medical Problem List and Plan: 1. 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 DVT Prophylaxis/Anticoagulation: Pharmaceutical: Lovenox 3. Pain Management:   Oxycodone IR and Oxycontin 10 qhs. Will wean as tolerated  Compression/massage/elevation 4. Mood: monitor for reactive depression 5. Neuropsych: This patient is capable of making decisions on his own behalf. 6. Skin/Wound Care: Dressing changes and ACE wrap to LLE. 7. Fluids/Electrolytes/Nutrition: Monitor I/Os, meal intake, may need dietary consult  Eating 80-100% of meals 8. HTN: norvasc, lisinopril, coreg, imdur, with prn hydralazine  Imdur increased on 1/24, improved today  Will cont to monitor and adjust as needed 9. ESRD: HD managed by [facility]. HD after therapies each day 10 Anemia of chronic disease:  Aranesp weekly in HD  With ABLA  Hb 9.8 on 1/20  Will cont to monitor periodically 11.  NISCM/CAD:Lipitor , ASA, Coreg  LOS (Days) 6 A FACE TO FACE EVALUATION WAS PERFORMED  [name] 10/18/2015 8:07 AM

## 2015-10-18 NOTE — Progress Notes (Signed)
Physical Therapy Session Note  Patient Details  Name: Randy [name] MRN: [id number] Date of Birth: 1931/11/29  Today's Date: 10/18/2015 PT Individual Time: 0908-1004 PT Individual Time Calculation (min): 56 min   Short Term Goals: Week 1:  PT Short Term Goal 1 (Week 1): pt will perform functional transfers with min A PT Short Term Goal 2 (Week 1): pt will demo dyanmic sitting balance for functional task with min A  Skilled Therapeutic Interventions/Progress Updates:    Pt received in bed reporting significant R shoulder/neck mm soreness; RN alerted.  Pt performed rolling to L and R on flat bed, no rail with mod A to don ace wrap anchor.  Performed rolling to L side with mod A on flat bed, no rail with verbal/tactile cues for full trunk flexion and rotation to L and max A for supine > sit with cues for full anterior lean/weight shift.  Pt required min A for dynamic sitting balance EOB to don R prosthesis.  Performed transfer bed > w/c with prosthesis and slideboard with mod A to maintain forward lean and use of head-hips relationship to advance hips across the board.  Performed w/c mobility training up/down ramp x 2 reps with mod A and verbal/visual and tactile cues for efficient w/c propulsion sequence (full circular UE strokes) for joint and energy conservation.  Pt reporting need to have BM.  Performed transfer w/c > BSC with slideboard and mod A.  Performed lateral leans from [facility] to lean on bed and w/c seat to doff shorts with assistance.  Pt required increased time on BSC but able to have BM; performed lateral leans for hygiene; transferred back to w/c on slideboard with mod-max A.  Pt handed off to OT and notified OT of pt shoulder pain/soreness.      Therapy Documentation Precautions:  Precautions Precautions: Fall Restrictions Weight Bearing Restrictions: Yes LLE Weight Bearing: Non weight bearing Vital Signs: Therapy Vitals Temp: 98 F (36.7 C) Temp Source: Oral Pulse Rate:  83 BP: 139/73 mmHg Patient Position (if appropriate): Lying Oxygen Therapy SpO2: 96 % O2 Device: Not Delivered Pain: Pain Assessment Pain Assessment: 0-10 Pain Score: 10-Worst pain ever Pain Type: Acute pain Pain Location: Shoulder Pain Orientation: Right Pain Descriptors / Indicators: Sore Pain Frequency: Constant Pain Onset: On-going Patients Stated Pain Goal: 2 Pain Intervention(s): RN made aware   See Function Navigator for Current Functional Status.   Therapy/Group: Individual Therapy  [name] [facility] 10/18/2015, 10:07 AM

## 2015-10-18 NOTE — Progress Notes (Signed)
Occupational Therapy Session Note  Patient Details  Name: Randy [name] MRN: [id number] Date of Birth: [birth year]  Today's Date: 10/18/2015 OT Individual Time: 1257-1420 OT Individual Time Calculation (min): 83 min    Short Term Goals: Week 1:  OT Short Term Goal 1 (Week 1):  Pt. will perform LB bathing with SBA for lateral and forward leaning OT Short Term Goal 2 (Week 1): Pt. will be LB dressing with SBA OT Short Term Goal 3 (Week 1): Pt. will transfer to [facility] with min assist. OT Short Term Goal 4 (Week 1): Pt. will transfer to tub bench with min assist  Skilled Therapeutic Interventions/Progress Updates:  Upon entering the room, pt supine in bed with daughter present in room. Pt with 5/10 c/o neck pain with heating pad on neck. Pt reports recently receiving pain medication prior to therapist entering the room. Pt agreeable to OT intervention. Pt performed supine >sit with max A for trunk and B LEs without use of bed rail. Pt seated on EOB and requiring supervision- min A for seated balance with therapist assisting pt to don prosthetic LE. Pt performs slide board transfer with mod A and manual facilitation as well as verbal cues for head/ hip relationship. Once in wheelchair, pt propelled with B LEs 100' with supervision multiple times as pt getting onto elevator and into gift shop. Pt taking multiple rest breaks as well for fatigue. Pt propelling wheelchair on carpeted surface, aisles, and in tight corners with supervision and increased time for task. Pt stopping and picking up items as well without difficulty at wheelchair level. Pt returned to room in same manner. Recreational therapist arrived in room for assessment as pt transferring back into bed. RT asking questions when pt taking rest break from fatigue. Pt returned to supine with call bell and all needed items within reach. Bed alarm activated.   Therapy Documentation Precautions:  Precautions Precautions: Fall Restrictions Weight  Bearing Restrictions: Yes LLE Weight Bearing: Non weight bearing  See Function Navigator for Current Functional Status.   Therapy/Group: Individual Therapy  [name] 10/18/2015, 2:25 PM

## 2015-10-19 ENCOUNTER — Inpatient Hospital Stay (HOSPITAL_COMMUNITY): Payer: Medicare Other

## 2015-10-19 ENCOUNTER — Inpatient Hospital Stay (HOSPITAL_COMMUNITY): Payer: Medicare Other | Admitting: Occupational Therapy

## 2015-10-19 ENCOUNTER — Inpatient Hospital Stay (HOSPITAL_COMMUNITY): Payer: Medicare Other | Admitting: Physical Therapy

## 2015-10-19 DIAGNOSIS — R0989 Other specified symptoms and signs involving the circulatory and respiratory systems: Secondary | ICD-10-CM | POA: Insufficient documentation

## 2015-10-19 DIAGNOSIS — G729 Myopathy, unspecified: Secondary | ICD-10-CM

## 2015-10-19 DIAGNOSIS — R030 Elevated blood-pressure reading, without diagnosis of hypertension: Secondary | ICD-10-CM

## 2015-10-19 DIAGNOSIS — M6289 Other specified disorders of muscle: Secondary | ICD-10-CM | POA: Insufficient documentation

## 2015-10-19 LAB — CBC
HEMATOCRIT: 28.8 % — AB (ref 39.0–52.0)
Hemoglobin: 8.6 g/dL — ABNORMAL LOW (ref 13.0–17.0)
MCH: 28.7 pg (ref 26.0–34.0)
MCHC: 29.9 g/dL — AB (ref 30.0–36.0)
MCV: 96 fL (ref 78.0–100.0)
Platelets: 218 10*3/uL (ref 150–400)
RBC: 3 MIL/uL — AB (ref 4.22–5.81)
RDW: 18.5 % — AB (ref 11.5–15.5)
WBC: 5.9 10*3/uL (ref 4.0–10.5)

## 2015-10-19 LAB — RENAL FUNCTION PANEL
Albumin: 1.9 g/dL — ABNORMAL LOW (ref 3.5–5.0)
Anion gap: 16 — ABNORMAL HIGH (ref 5–15)
BUN: 80 mg/dL — ABNORMAL HIGH (ref 6–20)
CHLORIDE: 91 mmol/L — AB (ref 101–111)
CO2: 24 mmol/L (ref 22–32)
CREATININE: 9.78 mg/dL — AB (ref 0.61–1.24)
Calcium: 8.6 mg/dL — ABNORMAL LOW (ref 8.9–10.3)
GFR, EST AFRICAN AMERICAN: 5 mL/min — AB (ref >60)
GFR, EST NON AFRICAN AMERICAN: 4 mL/min — AB (ref >60)
Glucose, Bld: 86 mg/dL (ref 65–99)
PHOSPHORUS: 6.9 mg/dL — AB (ref 2.5–4.6)
POTASSIUM: 5.6 mmol/L — AB (ref 3.5–5.1)
Sodium: 131 mmol/L — ABNORMAL LOW (ref 135–145)

## 2015-10-19 MED ORDER — DARBEPOETIN ALFA 100 MCG/0.5ML IJ SOSY
PREFILLED_SYRINGE | INTRAMUSCULAR | Status: AC
  Filled 2015-10-19: qty 0.5

## 2015-10-19 MED ORDER — OXYCODONE HCL 5 MG PO TABS
5.0000 mg | ORAL_TABLET | ORAL | Status: DC | PRN
  Administered 2015-10-19 – 2015-10-20 (×4): 5 mg via ORAL
  Filled 2015-10-19 (×4): qty 1

## 2015-10-19 MED ORDER — METHOCARBAMOL 500 MG PO TABS
500.0000 mg | ORAL_TABLET | Freq: Two times a day (BID) | ORAL | Status: DC
  Administered 2015-10-19 – 2015-10-21 (×5): 500 mg via ORAL
  Filled 2015-10-19 (×4): qty 1

## 2015-10-19 MED ORDER — OXYCODONE-ACETAMINOPHEN 5-325 MG PO TABS
1.0000 | ORAL_TABLET | ORAL | Status: DC | PRN
  Administered 2015-10-19 – 2015-10-20 (×3): 1 via ORAL
  Administered 2015-10-20: 2 via ORAL
  Administered 2015-10-20: 1 via ORAL
  Filled 2015-10-19 (×4): qty 1

## 2015-10-19 MED ORDER — DARBEPOETIN ALFA 200 MCG/0.4ML IJ SOSY: 200.0000 ug | PREFILLED_SYRINGE | INTRAMUSCULAR | Status: DC

## 2015-10-19 MED ORDER — KETOTIFEN FUMARATE 0.025 % OP SOLN
2.0000 [drp] | Freq: Three times a day (TID) | OPHTHALMIC | Status: DC
  Administered 2015-10-19 – 2015-10-21 (×6): 2 [drp] via OPHTHALMIC
  Filled 2015-10-19: qty 5

## 2015-10-19 MED ORDER — CAPSAICIN 0.075 % EX CREA: TOPICAL_CREAM | Freq: Three times a day (TID) | CUTANEOUS | Status: DC | PRN

## 2015-10-19 NOTE — Plan of Care (Signed)
Problem: RH PAIN MANAGEMENT Goal: RH STG PAIN MANAGED AT OR BELOW PT'S PAIN GOAL < 4  Pt reports pain as 8

## 2015-10-19 NOTE — Progress Notes (Signed)
[facility] PHYSICAL MEDICINE & REHABILITATION     PROGRESS NOTE  Subjective/Complaints:   Pt seen sleeping in bed this morning the head of his bed elevated and his neck turning to the side. He notes neck stiffness. Educated patient on sleeping posture.   ROS: +Neck stiffness. Denies CP, SOB, n/v/d.   Objective: Vital Signs: Blood pressure 144/82, pulse 85, temperature 98.7 F (37.1 C), temperature source Oral, resp. rate 15, height 5\' 9"  (1.753 m), weight 61.1 kg (134 lb 11.2 oz), SpO2 96 %. No results found.  Recent Labs  10/19/15 0201  WBC 5.9  HGB 8.6*  HCT 28.8*  PLT 218    Recent Labs  10/18/15 1228 10/19/15 0201  NA 134* 131*  K 5.2* 5.6*  CL 94* 91*  GLUCOSE 124* 86  BUN 72* 80*  CREATININE 8.90* 9.78*  CALCIUM 9.2 8.6*   CBG (last 3)  No results for input(s): GLUCAP in the last 72 hours.  Wt Readings from Last 3 Encounters:  10/19/15 61.1 kg (134 lb 11.2 oz)  10/12/15 59.9 kg (132 lb 0.9 oz)  10/04/15 54.432 kg (120 lb)    Physical Exam:  BP 144/82 mmHg  Pulse 85  Temp(Src) 98.7 F (37.1 C) (Oral)  Resp 15  Ht 5\' 9"  (1.753 m)  Wt 61.1 kg (134 lb 11.2 oz)  BMI 19.88 kg/m2  SpO2 96% Constitutional: He appears well-developed and well-nourished. NAD. Vital signs reviewed. HENT: Normocephalic and atraumatic. Right Ear: External ear normal. Left Ear: External ear normal.  Eyes: EOM are normal.   Cardiovascular: Normal rate and regular rhythm. Murmur heard. Respiratory: Effort normal and breath sounds normal. No respiratory distress.  GI: Soft. Bowel sounds are normal. He exhibits no distension. There is no tenderness.  Musculoskeletal: He exhibits ongoing tenderness to BKA site with palpation. Expected stump swelling (improving).  Neurological: He is alert and oriented.  Speech clear.  Follows basic commands without difficulty.  B/l UE 4-/5 Right hip flexion 4+/5 Left hip flexion 4+/5  Skin: Skin is warm and dry. No rash noted. No erythema.   Left AKA incision c/d/i.  Right BKA incision well healed and intact.  Psychiatric: He has a normal mood and affect. His behavior is normal. Thought content normal.   Assessment/Plan: 1. 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 Physiatrist is providing close team supervision and 24 hour management of active medical problems listed below. Physiatrist and rehab team continue to assess barriers to discharge/monitor patient progress toward functional and medical goals.  Function:  Bathing Bathing position   Position: Wheelchair/chair at sink  Bathing parts Body parts bathed by patient: Right arm, Left arm, Chest, Abdomen, Front perineal area, Right upper leg, Left upper leg Body parts bathed by helper: Back, Buttocks  Bathing assist Assist Level: Set up   Set up : To obtain items  Upper Body Dressing/Undressing Upper body dressing   What is the patient wearing?: Pull over shirt/dress     Pull over shirt/dress - Perfomed by patient: Thread/unthread right sleeve, Thread/unthread left sleeve, Put head through opening, Pull shirt over trunk          Upper body assist Assist Level: Set up   Set up : To obtain clothing/put away  Lower Body Dressing/Undressing Lower body dressing   What is the patient wearing?: Pants, Underwear Underwear - Performed by patient: Thread/unthread right underwear leg, Thread/unthread  left underwear leg Underwear - Performed by helper: Pull underwear up/down Pants- Performed by patient: Thread/unthread right pants leg, Thread/unthread left pants leg Pants- Performed by helper: Pull pants up/down                      Lower body assist Assist for lower body dressing: Touching or steadying assistance (Pt > 75%)      Toileting Toileting Toileting activity did not occur: Safety/medical concerns   Toileting steps completed  by helper: Adjust clothing prior to toileting, Performs perineal hygiene, Adjust clothing after toileting Toileting Assistive Devices: Prosthesis/orthosis  Toileting assist     Transfers Chair/bed transfer   Chair/bed transfer method: Lateral scoot Chair/bed transfer assist level: Moderate assist (Pt 50 - 74%/lift or lower) Chair/bed transfer assistive device: Sliding board, Prosthesis     Locomotion Ambulation Ambulation activity did not occur: Safety/medical concerns         Wheelchair   Type: Manual Max wheelchair distance: 100 Assist Level: Supervision or verbal cues  Cognition Comprehension Comprehension assist level: Follows basic conversation/direction with extra time/assistive device  Expression Expression assist level: Expresses basic needs/ideas: With no assist  Social Interaction Social Interaction assist level: Interacts appropriately 90% of the time - Needs monitoring or encouragement for participation or interaction.  Problem Solving Problem solving assist level: Solves basic problems with no assist  Memory Memory assist level: Recognizes or recalls 90% of the time/requires cueing < 10% of the time    Medical Problem List and Plan: 1. 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 DVT Prophylaxis/Anticoagulation: Pharmaceutical: Lovenox 3. Pain Management:   Oxycodone IR and Oxycontin 10 qhs. Will wean as tolerated  Compression/massage/elevation  Muscle cream added for right shoulder/neck stiffness 4. Mood: monitor for reactive depression 5. Neuropsych: This patient is capable of making decisions on his own behalf. 6. Skin/Wound Care: Dressing changes and ACE wrap to LLE. 7. Fluids/Electrolytes/Nutrition: Monitor I/Os, meal intake, may need dietary consult  Eating 100% of meals 8. HTN: norvasc, lisinopril, coreg, imdur, with prn hydralazine  Imdur increased on 1/24, amlodipine d/ced on 1/25 9. ESRD:  HD managed by [facility]. HD after therapies each day 10 Anemia of chronic disease:  Aranesp weekly in HD  With ABLA  Hb 8.6 on 1/26  Will hemoccult stools  Will cont to monitor periodically 11. NISCM/CAD:Lipitor , ASA, Coreg  LOS (Days) 7 A FACE TO FACE EVALUATION WAS PERFORMED  Alexis Reber Lorie Phenix 10/19/2015 7:18 AM

## 2015-10-19 NOTE — Progress Notes (Signed)
Patient arrived to unit per bed.  Reviewed treatment plan and this RN agrees.  Report received from bedside RN, Joelene Millin.  Consent verified.  Patient A & O X 4. Lung sounds clear to ausculation in all fields. Generalized edema. Cardiac: Regular R&R.  Prepped LLAVF with alcohol and cannulated with two 15 gauge needles.  Pulsation of blood noted.  Flushed access well with saline per protocol.  Connected and secured lines and initiated tx at [place].  UF goal of 2500 mL and net fluid removal of 2000 mL.  Will continue to monitor.

## 2015-10-19 NOTE — Progress Notes (Signed)
Physical Therapy Note  Patient Details  Name: Randy [name] MRN: [id number] Date of Birth: Sep 19, 1932 Today's Date: 10/19/2015    Time: 800-856 56 minutes  1:1 Pt c/o neck pain "it hurts bad".  RN made aware, meds given during session. Pt educated on importance of stretching.  Supine to sit with max A, increased time.  Sitting balance with supervision to don R LE prosthesis. Pt requires total A to don prosthesis due to hand and wrist pain.  Sliding board transfers with max A, pt requires max facilitation for forward wt shift, max cuing for technique and safety. Pt limited by decreased UE strength bilat.  Attempt to stand in parallel bars per pt request. Pt unable to fully stand despite max A and  Multiple attempts.  Pressure relief techniques in w/c, pt demo'd understanding.  W/c mobility training on carpet to simulate home environment with frequent rest breaks able to propel 100' on carpeted surface. Pt limited by UE weakness and perseverating in neck pain throughout session.   DONAWERTH,KAREN 10/19/2015, 8:57 AM

## 2015-10-19 NOTE — Progress Notes (Signed)
Occupational Therapy Session Note  Patient Details  Name: Randy [name] MRN: [id number] Date of Birth: [birth year]  Today's Date: 10/19/2015 OT Individual Time: 0905-1020 OT Individual Time Calculation (min): 75 min   Short Term Goals: Week 1:  OT Short Term Goal 1 (Week 1):  Pt. will perform LB bathing with SBA for lateral and forward leaning OT Short Term Goal 2 (Week 1): Pt. will be LB dressing with SBA OT Short Term Goal 3 (Week 1): Pt. will transfer to [facility] with min assist. OT Short Term Goal 4 (Week 1): Pt. will transfer to tub bench with min assist  Skilled Therapeutic Interventions/Progress Updates:  Pain Assessment: Neck, radiating to shoulders; "beyond 10", therapist encouraged relaxation and applied moist heat to area for ~7 minutes Phantom pain in BLEs, pt rated pain 10/10 "whenever it hits". Therapist encouraged relaxation, tactile stimulation, and exercises in head.  Upon entering room, pt found seated in w/c with right prosthesis donned and right wrist wrapped with ace wrap. Pt propelled self to sink for ADL at sink level. Therapist doffed ace wrap around wrist for ADL. Pt limited during ADL secondary to increased pain in neck that radiated to shoulders. Pt felt he wasn't able to complete ADL because of this pain. Therapist donned moist heat to help alleviate the pain. Pt required max encouragement to participate in ADL and be more independent with these tasks due to pt perseverating on neck/shoulder pain. Pt sat at sink for UB bathing & dressing, grooming tasks of washing face, brushing teeth, shaving, combing hair, and washing hands. Pt refused LB bathing & dressing, "it's clean, I don't need to do that". Pt did take more than a reasonable amount of time to complete tasks.  After ADL, pt propelled self from room to therapy gym and therapist administered a therapeutic grade grasp block to work on functional grip strength. Pt then propelled self back to room. Therapist assisted with  doffing of left prostheses and positioned pt in w/c to prevent any falls. During this session focused on pain management, overall activity tolerance/endurance, functional use of BUEs during ADLs, w/c propulsion. Wrapped right wrist at end of session for support and at end of session, pt left seated in w/c with all needs within reach.   Therapy Documentation Precautions:  Precautions Precautions: Fall Restrictions Weight Bearing Restrictions: Yes LLE Weight Bearing: Non weight bearing  Vital Signs:   See Function Navigator for Current Functional Status.  Therapy/Group: Individual Therapy  Chrys Racer , MS, OTR/L, CLT Pager: [phone]  10/19/2015, 10:31 AM

## 2015-10-19 NOTE — Progress Notes (Signed)
Addressed family's concerns about therapy intensity. SW also present to answer questions about follow up services and goals. Family concerned that intensity of therapy causing strain on muscles leading to pain as well as frustration due to endurance issues. Dicussed focus of therapy to build endurance, work on safety as well as attempts at pain management. Robaxin added to help with neck pain and therapy is working on [employer].  Will add rest breaks and modify to 15/7 day schedule to help with tolerance especially after HD days.

## 2015-10-19 NOTE — Progress Notes (Signed)
Physical Therapy Session Note  Patient Details  Name: Randy [name] MRN: [id number] Date of Birth: Sep 18, 1932  Today's Date: 10/19/2015 PT Individual Time: 1102-1202 PT Individual Time Calculation (min): 60 min   Short Term Goals: Week 1:  PT Short Term Goal 1 (Week 1): pt will perform functional transfers with min A PT Short Term Goal 2 (Week 1): pt will demo dyanmic sitting balance for functional task with min A  Skilled Therapeutic Interventions/Progress Updates:    Patient with c/o neck pain limiting ability to participate in therapy today.  Encouraged to participate to work on pain relieving measures for his neck.  Attempted to don R LE prosthesis, but pt c/o too tight and painful with pressure over medial femoral condyle so called into [employer] for adjustment.  Patient propelled w/c x 120' supervision.  Transferred via slide board to mat mod A to higher surface.  Sit to supine mod A and min A for sitting balance.  Patient in supine for soft tissue work, gentle PROM rotation, lateral flexion and flexion/ extension to tolerance, joint mobs and manual traction for 20 minutes followed by moist heat to cervical area x 10 min.  Performed supine isometric hip extension 10 x 5 sec hold each leg.  Patient supine to sit max A and transferred into w/c mod A and propelled back to room with supervision.  Assisted into bed at pt request mod A via slide board transfer.  Left with all needs in reach.  RN and PA informed of patient's pain.  Therapy Documentation Precautions:  Precautions Precautions: Fall Restrictions Weight Bearing Restrictions: Yes LLE Weight Bearing: Non weight bearing Pain: Pain Assessment Faces Pain Scale: Hurts whole lot Pain Type: Acute pain Pain Location: Neck Pain Orientation: Right;Left Pain Descriptors / Indicators: Aching;Sore;Tightness Pain Onset: Progressive Pain Intervention(s): Massage;Heat applied (PROM, traction)   See Function Navigator for Current Functional  Status.   Therapy/Group: Individual Therapy  [place], [place] 10/19/2015  10/19/2015, 12:36 PM

## 2015-10-19 NOTE — Progress Notes (Signed)
Dialysis treatment completed.  2500 mL ultrafiltrated and net fluid removal 2000 mL.    Patient status unchanged. Lung sounds clear to ausculation in all fields. Generalized edema. Cardiac: Regular R&R.  Disconnected lines and removed needles.  Pressure held for 10 minutes and band aid/gauze dressing applied.  Report given to bedside RN, Joelene Millin.

## 2015-10-20 ENCOUNTER — Inpatient Hospital Stay (HOSPITAL_COMMUNITY): Payer: Medicare Other

## 2015-10-20 ENCOUNTER — Inpatient Hospital Stay (HOSPITAL_COMMUNITY): Payer: Medicare Other | Admitting: Physical Therapy

## 2015-10-20 LAB — CBC WITH DIFFERENTIAL/PLATELET
BASOS ABS: 0 10*3/uL (ref 0.0–0.1)
BASOS PCT: 0 %
EOS ABS: 0.1 10*3/uL (ref 0.0–0.7)
EOS PCT: 2 %
HCT: 30.5 % — ABNORMAL LOW (ref 39.0–52.0)
Hemoglobin: 9.3 g/dL — ABNORMAL LOW (ref 13.0–17.0)
LYMPHS PCT: 14 %
Lymphs Abs: 0.7 10*3/uL (ref 0.7–4.0)
MCH: 29.8 pg (ref 26.0–34.0)
MCHC: 30.5 g/dL (ref 30.0–36.0)
MCV: 97.8 fL (ref 78.0–100.0)
Monocytes Absolute: 0.6 10*3/uL (ref 0.1–1.0)
Monocytes Relative: 11 %
Neutro Abs: 3.8 10*3/uL (ref 1.7–7.7)
Neutrophils Relative %: 73 %
PLATELETS: 254 10*3/uL (ref 150–400)
RBC: 3.12 MIL/uL — AB (ref 4.22–5.81)
RDW: 18.4 % — ABNORMAL HIGH (ref 11.5–15.5)
WBC: 5.2 10*3/uL (ref 4.0–10.5)

## 2015-10-20 LAB — RENAL FUNCTION PANEL
ALBUMIN: 1.9 g/dL — AB (ref 3.5–5.0)
Anion gap: 13 (ref 5–15)
BUN: 57 mg/dL — AB (ref 6–20)
CO2: 28 mmol/L (ref 22–32)
CREATININE: 7.6 mg/dL — AB (ref 0.61–1.24)
Calcium: 8.9 mg/dL (ref 8.9–10.3)
Chloride: 94 mmol/L — ABNORMAL LOW (ref 101–111)
GFR calc Af Amer: 7 mL/min — ABNORMAL LOW (ref >60)
GFR, EST NON AFRICAN AMERICAN: 6 mL/min — AB (ref >60)
GLUCOSE: 162 mg/dL — AB (ref 65–99)
PHOSPHORUS: 5.8 mg/dL — AB (ref 2.5–4.6)
Potassium: 4.8 mmol/L (ref 3.5–5.1)
SODIUM: 135 mmol/L (ref 135–145)

## 2015-10-20 LAB — OCCULT BLOOD X 1 CARD TO LAB, STOOL: FECAL OCCULT BLD: NEGATIVE

## 2015-10-20 MED ORDER — OXYCODONE-ACETAMINOPHEN 5-325 MG PO TABS
ORAL_TABLET | ORAL | Status: AC
  Filled 2015-10-20: qty 2

## 2015-10-20 NOTE — Progress Notes (Signed)
Subjective:   No cos /Feels  With Robaxin added / Daughter in room / HD today  Objective Vital signs in last 24 hours: Filed Vitals:   10/19/15 2102 10/20/15 0156 10/20/15 0500 10/20/15 0607  BP:  146/64  151/68  Pulse: 76 80  84  Temp:    98.2 F (36.8 C)  TempSrc:    Oral  Resp:    18  Height:      Weight:   61.1 kg (134 lb 11.2 oz)   SpO2:    95%   Weight change: -2.1 kg (-4 lb 10.1 oz) Physical Exam: General=Alert  Calm , NAD / Eating Lunch  Chest = CTA bilat Card =RRR no m. r. g. Abd= soft nt nd  Ext =L AKA and R BKA,No edema HD access= pos. 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 ca ^  = Hold Vit d on hd /3 renvela binder / phos 6.9 fu trend on Renvela  2400 ac 5 Anemia = hgb 9.3 cont esa/ Fe / 200mg  Aranesp last hd wed   Ernest Haber, PA-C [place] [phone] 10/20/2015,12:38 PM  LOS: 8 days   Pt seen, examined and agree w A/P as above.  Kelly Splinter MD [facility] pager [phone]    cell [phone] 10/20/2015, 4:14 PM    Labs: Basic Metabolic Panel:  Recent Labs Lab 10/13/15 1518 10/16/15 1935 10/18/15 1228 10/19/15 0201  NA 131* 128* 134* 131*  K 4.7 6.1* 5.2* 5.6*  CL 92* 90* 94* 91*  CO2 22 22 26 24   GLUCOSE 133* 117* 124* 86  BUN 58* 101* 72* 80*  CREATININE 10.86* 11.74* 8.90* 9.78*  CALCIUM 8.9 9.0 9.2 8.6*  PHOS 6.6* 5.8*  --  6.9*   Liver Function Tests:  Recent Labs Lab 10/13/15 1518 10/16/15 1935 10/19/15 0201  ALBUMIN 2.2* 2.3* 1.9*   No results for input(s): LIPASE, AMYLASE in the last 168 hours. No results for input(s): AMMONIA in the last 168  hours. CBC:  Recent Labs Lab 10/13/15 1518 10/19/15 0201 10/20/15 0905  WBC 6.5 5.9 5.2  NEUTROABS  --   --  3.8  HGB 9.8* 8.6* 9.3*  HCT 32.0* 28.8* 30.5*  MCV 96.1 96.0 97.8  PLT 206 218 254   Cardiac Enzymes: No results for input(s): CKTOTAL, CKMB, CKMBINDEX, TROPONINI in the last 168 hours. CBG: No results for input(s): GLUCAP in the last 168 hours.  Studies/Results: Dg Wrist Complete Right  10/20/2015  CLINICAL DATA:  Acute right wrist pain without known injury. EXAM: RIGHT WRIST - COMPLETE 3+ VIEW COMPARISON:  July 10, 2012. FINDINGS: There is no evidence of fracture or dislocation. Vascular calcifications are noted. Narrowing and sclerosis of the first carpometacarpal joint as well as several intercarpal joints is noted. IMPRESSION: Findings consistent with osteoarthritis involving the first carpometacarpal and several intercarpal joints. No acute abnormality seen in the right wrist. Electronically Signed   By: Marijo Conception, M.D.   On: 10/20/2015 09:39   Medications:   . aspirin  81 mg  Oral Daily  . atorvastatin  40 mg Oral q1800  . carvedilol  25 mg Oral BID WC  . [START ON 10/25/2015] darbepoetin (ARANESP) injection - DIALYSIS  200 mcg Intravenous Q Wed-HD  . enoxaparin (LOVENOX) injection  30 mg Subcutaneous Q24H  . feeding supplement (NEPRO CARB STEADY)  237 mL Oral BID BM  . gabapentin  100 mg Oral QHS  . isosorbide mononitrate  30 mg Oral QHS  . ketotifen  2 drop Both Eyes TID AC  . latanoprost  1 drop Both Eyes QHS  . lisinopril  10 mg Oral QHS  . methocarbamol  500 mg Oral BID  . multivitamin  1 tablet Oral QHS  . pantoprazole  40 mg Oral Daily  . senna-docusate  2 tablet Oral QHS  . sevelamer carbonate  2,400 mg Oral TID WC

## 2015-10-20 NOTE — Patient Care Conference (Signed)
Inpatient RehabilitationTeam Conference and Plan of Care Update Date: 10/18/2015   Time: 2:05 PM    Patient Name: Randy [name]      Medical Record Number: [id number]  Date of Birth: 1932-04-07 Sex: Male         Room/Bed: 4M05C/4M05C-01 Payor Info: Payor: BLUE CROSS BLUE SHIELD MEDICARE / Plan: BCBS MEDICARE / Product Type: *No Product type* /    Admitting Diagnosis: L AKA OLD R BKA  Admit Date/Time:  10/12/2015  5:54 PM Admission Comments: No comment available   Primary Diagnosis:  <principal problem not specified> Principal Problem: <principal problem not specified>  Patient Active Problem List   Diagnosis Date Noted  . Labile blood pressure   . Muscle stiffness   . S/P AKA (above knee amputation) unilateral ([place])   . History of right above knee amputation ([place])   . Benign essential HTN   . Unilateral AKA ([place]) 10/12/2015  . Abnormality of gait   . Status post above knee amputation of left lower extremity ([place])   . Chronic combined systolic and diastolic congestive heart failure ([place])   . ESRD on dialysis ([place])   . Essential hypertension   . Post-operative pain   . Peripheral vascular disease with pain at rest [facility]) 10/10/2015  . Chest pain 08/04/2015  . PD catheter dysfunction ([place]) 03/13/2015  . Cellulitis and abscess of foot   . Kidney mass   . Other emphysema ([place])   . Acute blood loss anemia   . Physical deconditioning   . S/P BKA (below knee amputation) unilateral ([place])   . Renal mass 01/01/2015  . Foot pain 12/30/2014  . Foot pain, right 12/30/2014  . CHF (congestive heart failure) ([place]) 12/30/2014  . CAD (coronary artery disease) 12/30/2014  . Hyperkalemia 12/30/2014  . ESRD (end stage renal disease) on dialysis ([place])   . Blood loss anemia   . Lower GI bleed 09/21/2014  . Fluid overload 08/29/2014  . Acute on chronic combined systolic and diastolic CHF (congestive heart failure) ([place]) 08/29/2014  . Acute respiratory failure with hypoxia ([place]) 08/29/2014  .  Acute respiratory failure with hypoxemia ([place]) 08/29/2014  . Acute myopericarditis 08/18/2014  . Elevated troponin 08/18/2014  . Anemia of chronic disease   . End stage renal disease, has been peritoneal, now hemodialysis 07/28/2012  . 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 HLD (hyperlipidemia) 12/10/2006  . Hypertensive heart disease 08/18/2006  . OSTEOARTHRITIS 08/18/2006  . Polycystic kidney 08/18/2006    Expected Discharge Date: Expected Discharge Date: 10/26/15  Team Members Present: Physician leading conference: Dr. Delice Lesch Social Worker Present: Alfonse Alpers, LCSW Nurse Present: Heather Roberts, RN PT Present: [name], PT OT Present: Willeen Cass, OT SLP Present: Windell Moulding, SLP PPS Coordinator present : Daiva Nakayama, RN, CRRN     Current Status/Progress Goal Weekly Team Focus  Medical   Decreased mobility and self care secondary to Left AKA with prior hx of R BKA with ESRD  Improve transfers and mobility  Adjust pain meds, BP meds   Bowel/Bladder   Continent of bowel and bladder. LBM 10/18/15. Oliguric.   Remain continent of bowel and bladder  Offer toileting and encourage use of stool softners.    Swallow/Nutrition/ Hydration             ADL's   Min A for upper body BADL, Mod-Max A for lower body, Min-Mod SB transfers, Max Toileting  Overall Supervision for BADL and transfers  Pain management, activity tolerance, SB transfers, DME/AE  training, UE strengthening   Mobility   Supervision w/c mobility, mod A bed mobility and transfers, max-total A standing with R prosthesis  Supervision transfers, mod I w/c mobility  Activity tolerance, strengthening, pain management, balance and transfers   Communication             Safety/Cognition/ Behavioral Observations            Pain   c/o pain to neck and shoulders. Treated with Oxy IR Q4 and robaxin Q6.   <4   Assess pain frequently and treat accordingly.    Skin   incision to left stump. Dry dressing with ace  bandage changed daily. MASD to bottom. Barrier cream, foam, and pressure relief methods added.   No new skin breakdown or infection.   Assess skin q shift and prn. Encourage pressure relief methods. Change dressing daily     Rehab Goals Patient on target to meet rehab goals: Yes Rehab Goals Revised: none *See Care Plan and progress notes for long and short-term goals.  Barriers to Discharge: HTN, pain, safety    Possible Resolutions to Barriers:  adjust BP, pain meds, pt/family edu    Discharge Planning/Teaching Needs:  Pt plans to return to his home with his wife, children, and niece to assist him at home.  Pt's family can come in closer to d/c for family education, as needed.   Team Discussion:  Dr. Posey Pronto is adjusting pt's pain medications and blood pressure medications.  Pt's shoulder is really bothering him and PT feels it is from using his upper body so much.  Pt is weak and fatgues easily, especially with transfers.  PT is not working on standing goals, as this will not be functional for home use.  OT is using slide board with prosthesis for transfers.  Revisions to Treatment Plan:  none   Continued Need for Acute Rehabilitation Level of Care: The patient requires daily medical management by a physician with specialized training in physical medicine and rehabilitation for the following conditions: Daily direction of a multidisciplinary physical rehabilitation program to ensure safe treatment while eliciting the highest outcome that is of practical value to the patient.: Yes Daily medical management of patient stability for increased activity during participation in an intensive rehabilitation regime.: Yes Daily analysis of laboratory values and/or radiology reports with any subsequent need for medication adjustment of medical intervention for : Post surgical problems;Wound care problems;Blood pressure problems;Renal problems  Zyler Hyson, Silvestre Mesi 10/20/2015, 9:19 AM

## 2015-10-20 NOTE — Progress Notes (Signed)
Physical Therapy Weekly Progress Note  Patient Details  Name: Randy [name] MRN: [id number] Date of Birth: 05-18-1932  Beginning of progress report period: October 13, 2015 End of progress report period: October 20, 2015  Today's Date: 10/20/2015 PT Individual Time: 0745-0900 PT Individual Time Calculation (min): 75 min   Patient has met 1 of 2 short term goals.  Pt is currently min A with even surface transfers but mod A when going to an elevated surface. Pt is currently supervision with sitting balance and w/c mobility, continues to need assistance with w/c parts management. Pt with difficulty tolerating therapy, has been changed to 15 hours over 7 days.  Patient continues to demonstrate the following deficits:impaired strength, endurance, activity tolerance, mobility and therefore will continue to benefit from skilled PT intervention to enhance overall performance with activity tolerance, balance and ability to compensate for deficits.  Patient progressing toward long term goals..  Plan of care revisions: 15/7.  PT Short Term Goals Week 1:  PT Short Term Goal 1 (Week 1): pt will perform functional transfers with min A PT Short Term Goal 1 - Progress (Week 1): Partly met PT Short Term Goal 2 (Week 1): pt will demo dyanmic sitting balance for functional task with min A PT Short Term Goal 2 - Progress (Week 1): Met  Skilled Therapeutic Interventions/Progress Updates:  Ambulation/gait training;Balance/vestibular training;Community reintegration;Functional electrical stimulation;Patient/family education;UE/LE Coordination activities;UE/LE Strength taining/ROM;Stair training;Splinting/orthotics;Pain Buyer, retail;Neuromuscular re-education;Therapeutic Exercise;Wheelchair propulsion/positioning;Therapeutic Activities;Functional mobility training;Discharge planning   Therapy Documentation Pt performed rolling with min A with bed rails to apply ace wrap to L  residual limb. Pt mod A supine to sit, limited by decreased trunk strength. Pt able to sit edge of bed with supervision for donning prosthesis. [employer] applied padding to the inner prosthesis, pt still with some pain at the end of Rt residual limb.  Pt performed sliding board transfers with supervision-min A to level surfaces. Attempted squat pivot transfers with min A to level surface, mod A to elevated surface. Supine TE for LE strengthening and stretching, pt with increased time able to lay Lt residual limb flat at 0 degrees hip extension.  Seated core exercises with medicine ball with trunk diagonals.  Pt then needs to use bathroom.  Min A to drop arm commode over toilet, min A multiple lateral leans for clothing negotiation and hygiene, mod A off of drop arm due to uphill transfer. Pt fatigued at this point and unable to complete final 15 minutes of session.  Precautions:  Precautions Precautions: Fall Restrictions Weight Bearing Restrictions: Yes LLE Weight Bearing: Non weight bearing General: PT Amount of Missed Time (min): 15 Minutes PT Missed Treatment Reason: Patient fatigue Pain: Pt with no c/o pain this session, says his neck feels "much better"    See Function Navigator for Current Functional Status.  Therapy/Group: Individual Therapy  Delories Mauri 10/20/2015, 9:00 AM

## 2015-10-20 NOTE — Progress Notes (Addendum)
[facility]IC[facility]ILIT[facility]     PROGRESS NOTE  Subjective/Complaints:   Pt resting in bed this AM.  He slept better last night and is positioned more appropriately in bed.  He still feels he is weak, but stronger than yesterday.   ROS: Denies neck pain, stump pain, phantom pain, CP, SOB, n/v/d.   Objective: Vital Signs: Blood pressure 151/68, pulse 84, temperature 98.2 F (36.8 C), temperature source Oral, resp. rate 18, height 5\' 9"  (1.753 m), weight 61.1 kg (134 lb 11.2 oz), SpO2 95 %. No results found.  Recent Labs  10/19/15 0201  WBC 5.9  HGB 8.6*  HCT 28.8*  PLT 218    Recent Labs  10/18/15 1228 10/19/15 0201  NA 134* 131*  K 5.2* 5.6*  CL 94* 91*  GLUCOSE 124* 86  BUN 72* 80*  CREATININE 8.90* 9.78*  CALCIUM 9.2 8.6*   CBG (last 3)  No results for input(s): GLUCAP in the last 72 hours.  Wt Readings from Last 3 Encounters:  10/20/15 61.1 kg (134 lb 11.2 oz)  10/12/15 59.9 kg (132 lb 0.9 oz)  10/04/15 54.432 kg (120 lb)    Physical Exam:  BP 151/68 mmHg  Pulse 84  Temp(Src) 98.2 F (36.8 C) (Oral)  Resp 18  Ht 5\' 9"  (1.753 m)  Wt 61.1 kg (134 lb 11.2 oz)  BMI 19.88 kg/m2  SpO2 95% Constitutional: He appears well-developed and well-nourished. NAD. Vital signs reviewed. HENT: Normocephalic and atraumatic. Right Ear: External ear normal. Left Ear: External ear normal.  Eyes: EOM are normal.   Cardiovascular: Normal rate and regular rhythm. Murmur heard. Respiratory: Effort normal and breath sounds normal. No respiratory distress.  GI: Soft. Bowel sounds are normal. He exhibits no distension. There is no tenderness.  Musculoskeletal: He exhibits ongoing mild tenderness to BKA site with palpation (improving). No edema. Neurological: He is alert and oriented.  Speech clear.  Follows basic commands without difficulty.  B/l UE 4+/5 Right hip flexion 4+/5 Left hip flexion 4+/5  Skin: Skin is warm and dry. No rash noted. No erythema.   Left AKA incision c/d/i.  Right BKA incision well healed and intact.  Psychiatric: He has a normal mood and affect. His behavior is normal. Thought content normal.   Assessment/Plan: 1. 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 Physiatrist is providing close team supervision and 24 hour management of active medical problems listed below. Physiatrist and rehab team continue to assess barriers to discharge/monitor patient progress toward functional and medical goals.  Function:  Bathing Bathing position   Position: Wheelchair/chair at sink  Bathing parts Body parts bathed by patient: Right arm, Left arm, Chest, Abdomen (pt refused LB bathing) Body parts bathed by helper: Back, Buttocks  Bathing assist Assist Level: Set up   Set up : To obtain items  Upper Body Dressing/Undressing Upper body dressing   What is the patient wearing?: Pull over shirt/dress     Pull over shirt/dress - Perfomed by patient: Thread/unthread right sleeve, Thread/unthread left sleeve, Put head through opening, Pull shirt over trunk          Upper body assist Assist Level: Set up   Set up : To obtain clothing/put away  Lower Body Dressing/Undressing Lower body dressing Lower body dressing/undressing activity did not occur: Refused What is the patient wearing?: Pants, Underwear Underwear - Performed by patient:  Thread/unthread right underwear leg, Thread/unthread left underwear leg Underwear - Performed by helper: Pull underwear up/down Pants- Performed by patient: Thread/unthread right pants leg, Thread/unthread left pants leg Pants- Performed by helper: Pull pants up/down                      Lower body assist Assist for lower body dressing: Touching or steadying assistance (Pt > 75%)      Toileting Toileting Toileting activity did not occur: N/A (did not occur  this session)   Toileting steps completed by helper: Adjust clothing prior to toileting, Performs perineal hygiene, Adjust clothing after toileting Toileting Assistive Devices: Prosthesis/orthosis  Toileting assist     Transfers Chair/bed transfer   Chair/bed transfer method: Lateral scoot Chair/bed transfer assist level: Moderate assist (Pt 50 - 74%/lift or lower) Chair/bed transfer assistive device: Sliding board     Locomotion Ambulation Ambulation activity did not occur: Safety/medical concerns         Wheelchair   Type: Manual Max wheelchair distance: 120 Assist Level: Supervision or verbal cues  Cognition Comprehension Comprehension assist level: Follows basic conversation/direction with extra time/assistive device  Expression Expression assist level: Expresses basic needs/ideas: With no assist  Social Interaction Social Interaction assist level: Interacts appropriately 90% of the time - Needs monitoring or encouragement for participation or interaction.  Problem Solving Problem solving assist level: Solves basic problems with no assist  Memory Memory assist level: Recognizes or recalls 90% of the time/requires cueing < 10% of the time    Medical Problem List and Plan: 1. 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 DVT Prophylaxis/Anticoagulation: Pharmaceutical: Lovenox 3. Pain Management:   Oxycodone IR PRN, Robaxin BID and Oxycontin 10 qhs.  Compression/massage/elevation  Muscle cream added for right shoulder/neck stiffness 4. Mood: monitor for reactive depression 5. Neuropsych: This patient is capable of making decisions on his own behalf. 6. Skin/Wound Care: Dressing changes and ACE wrap to LLE. 7. Fluids/Electrolytes/Nutrition: Monitor I/Os, meal intake, may need dietary consult  Eating 80-100% of meals 8. HTN: norvasc, lisinopril, coreg, imdur, with prn hydralazine  Imdur increased on 1/24,  amlodipine d/ced on 1/25  Remains labile, however, fairly well controlled 9. ESRD: HD managed by [facility]. HD after therapies each day 10 Anemia of chronic disease:  Aranesp weekly in HD  With ABLA  Hb 8.6 on 1/26  Hemoccult stools pending  Will repeat labs today  Will cont to monitor periodically 11. NISCM/CAD:Lipitor , ASA, Coreg  LOS (Days) 8 A FACE TO FACE EVALUATION WAS PERFORMED  Michah Minton Lorie Phenix 10/20/2015 7:48 AM

## 2015-10-20 NOTE — Progress Notes (Signed)
Orthopedic Tech Progress Note Patient Details:  Randy [name] [birth year] [id number]  Patient ID: Randy [name], male   DOB: [birth year], 82 y.o.   MRN: [id number] Called in advanced brace order; spoke with Evangeline Dakin, Randy [name] 10/20/2015, 10:12 AM

## 2015-10-20 NOTE — Plan of Care (Signed)
Pt's plan of care adjusted to 15 hours over 7 days per PA order and discussion with patient and family to help with tolerance especially after HD days as pt currently unable to tolerate current therapy schedule with OT and PT.

## 2015-10-20 NOTE — Progress Notes (Signed)
Occupational Therapy Session Note  Patient Details  Name: Randy [name] MRN: [id number] Date of Birth: [birth year]  Today's Date: 10/20/2015 OT Individual Time: 1100-1200 OT Individual Time Calculation (min): 60 min    Short Term Goals: Week 1:  OT Short Term Goal 1 (Week 1):  Pt. will perform LB bathing with SBA for lateral and forward leaning OT Short Term Goal 2 (Week 1): Pt. will be LB dressing with SBA OT Short Term Goal 3 (Week 1): Pt. will transfer to [facility] with min assist. OT Short Term Goal 4 (Week 1): Pt. will transfer to tub bench with min assist  Skilled Therapeutic Interventions/Progress Updates: ADL-retraining at shower level with focus on improved efficiency with slide board transfer, dynamic sitting balance, adapted bathing/dressing skills, and bed mobility.   Pt completes lateral scoot transfer from w/c to tub bench in walk-in shower w/o prosthesis or slide board with only min guard assist for safety.   Pt doffs clothing with min assist and then showers using lateral leans to wash buttocks.   Pt requires slide board to exit shower with min assist to steady slide board and min vc for technique.   Pt dresses in w/c at sink with min assist to pull up underwear and pants as pt pushes up off cushion using armrests.   Pt completes slide board transfer to bed; OT educates pt on donning pants in bed using rolling technique while side lying.   Pt remains in bed at end of session with K-pad placed for upper back/neck and icepack placed under LLE stump to reduce pain.     Therapy Documentation Precautions:  Precautions Precautions: Fall Restrictions Weight Bearing Restrictions: Yes LLE Weight Bearing: Non weight bearing  Pain:    See Function Navigator for Current Functional Status.  Second session: Time: 1300-1330 Time Calculation (min):  30 min  Pain Assessment: No/denies pain  Skilled Therapeutic Interventions: ADL-retraining with focus on pt/family education and training  on effective use of AE (slide board) and home safety recommendations (using Transfer Handle on standard bed).   Pt received sitting at EOB with daughter Hilda Blades) present.   OT educated pt and daughter on need for review of pt's performance with bed mobility and transfers d/t possible need for rails as pt depends on DME to date to transfer in/out of bed.  However, with rails down, pt rose to upright sitting in bed and completed slide board transfer to w/c with close supervision and with w/c placed along bed (OT providing setup).   Pt requires cues to pull pant legs down to facilitate sliding.  Nevertheless, pt reports need for DME when fatigued and is receptive to inspection of available devices to attach to standard bed versus replacing his bed at home with hospital bed.   OT escorts pt and daughter to ADL apt and demo's use of Transfer Handle.   Pt repeats slide board transfer in/out of bed using transfer handle with min instructional cues and standby assist for safety.   OT provided print copy of Transfer Handle price and source.  Plan to alert SW to use of slide board and handle.    See FIM for current functional status  Therapy/Group: Individual Therapy   Therapy/Group: Individual Therapy  Mekhia Brogan 10/20/2015, 12:12 PM

## 2015-10-20 NOTE — Progress Notes (Signed)
Dialysis treatment completed.  1700 mL ultrafiltrated and net fluid removal 1200 mL.    Patient status unchanged. Lung sounds clear to ausculation in all fields. No edema. Cardiac: Regular R&R.  Disconnected lines and removed needles.  Pressure held for 10 minutes and band aid/gauze dressing applied.  Report given to bedside RN, Vikki Ports.

## 2015-10-20 NOTE — Progress Notes (Addendum)
Patient arrived to unit per bed.  Reviewed treatment plan and this RN agrees.  Report received from bedside RN, Vikki Ports.  Consent verified.  Patient A & O X 4. Lung sounds clear to ausculation in all fields. No edema. Cardiac: Regular R&R.  Prepped LLAVF with alcohol and cannulated with two 15 gauge needles.  Pulsation of blood noted.  Flushed access well with saline per protocol.  Connected and secured lines and initiated tx at 1536.  UF goal of 1700 mL and net fluid removal of 1200 mL.  Will continue to monitor.

## 2015-10-20 NOTE — Progress Notes (Signed)
Social Work Patient ID: Randy [name], male   DOB: 04-Apr-1932, 82 y.o.   MRN: [id number]   CSW met with pt and his dtr, Randy [name], on 10-19-15 to update them on team conference discussion and then spoke to pt's wife via telephone.  Pt was frustrated in the room at the time of CSW visit and was even talking with his family about leaving Rehab not and staying through our targeted d/c date of 10-26-15.  CSW and PA met with pt and dtr together and Pam, PA, explained that pt is hurting and frustrated, but that we want to continue to work through neck/shoulder pain to get pt as independent as possible.  She has already started him on a muscle relaxant and adjusted pain medications and suggested that we spread therapies out and change his schedule to 15 hours over 7 days.  Pt was agreeable to this and feels he can stay another week with these modifications.  Wife/dtr were also agreeable and seemed pleased with the solution.  They would like to use [facility] when pt leaves the hospital.  CSW can arrange this, as well as order any necessary DME, as we near pt's d/c.

## 2015-10-21 ENCOUNTER — Inpatient Hospital Stay (HOSPITAL_COMMUNITY): Payer: Medicare Other | Admitting: Physical Therapy

## 2015-10-21 ENCOUNTER — Observation Stay (HOSPITAL_COMMUNITY)
Admission: AD | Admit: 2015-10-21 | Discharge: 2015-10-23 | Disposition: A | Discharge status: Rehabilitation Facility | Payer: Medicare Other | Source: Other Acute Inpatient Hospital | Attending: Internal Medicine | Admitting: Internal Medicine

## 2015-10-21 ENCOUNTER — Inpatient Hospital Stay (HOSPITAL_COMMUNITY): Payer: Medicare Other

## 2015-10-21 DIAGNOSIS — Z89612 Acquired absence of left leg above knee: Secondary | ICD-10-CM | POA: Insufficient documentation

## 2015-10-21 DIAGNOSIS — N186 End stage renal disease: Secondary | ICD-10-CM | POA: Insufficient documentation

## 2015-10-21 DIAGNOSIS — Z992 Dependence on renal dialysis: Secondary | ICD-10-CM | POA: Diagnosis not present

## 2015-10-21 DIAGNOSIS — I5042 Chronic combined systolic (congestive) and diastolic (congestive) heart failure: Secondary | ICD-10-CM | POA: Diagnosis not present

## 2015-10-21 DIAGNOSIS — D631 Anemia in chronic kidney disease: Secondary | ICD-10-CM | POA: Diagnosis not present

## 2015-10-21 DIAGNOSIS — R079 Chest pain, unspecified: Principal | ICD-10-CM | POA: Diagnosis present

## 2015-10-21 DIAGNOSIS — I248 Other forms of acute ischemic heart disease: Secondary | ICD-10-CM | POA: Diagnosis not present

## 2015-10-21 DIAGNOSIS — Z8673 Personal history of transient ischemic attack (TIA), and cerebral infarction without residual deficits: Secondary | ICD-10-CM | POA: Diagnosis not present

## 2015-10-21 DIAGNOSIS — I120 Hypertensive chronic kidney disease with stage 5 chronic kidney disease or end stage renal disease: Secondary | ICD-10-CM | POA: Diagnosis not present

## 2015-10-21 DIAGNOSIS — I2510 Atherosclerotic heart disease of native coronary artery without angina pectoris: Secondary | ICD-10-CM | POA: Diagnosis not present

## 2015-10-21 DIAGNOSIS — K219 Gastro-esophageal reflux disease without esophagitis: Secondary | ICD-10-CM | POA: Insufficient documentation

## 2015-10-21 DIAGNOSIS — Z89511 Acquired absence of right leg below knee: Secondary | ICD-10-CM | POA: Insufficient documentation

## 2015-10-21 DIAGNOSIS — K210 Gastro-esophageal reflux disease with esophagitis: Secondary | ICD-10-CM | POA: Insufficient documentation

## 2015-10-21 DIAGNOSIS — Z87891 Personal history of nicotine dependence: Secondary | ICD-10-CM | POA: Diagnosis not present

## 2015-10-21 DIAGNOSIS — Z7982 Long term (current) use of aspirin: Secondary | ICD-10-CM | POA: Insufficient documentation

## 2015-10-21 DIAGNOSIS — E785 Hyperlipidemia, unspecified: Secondary | ICD-10-CM | POA: Diagnosis not present

## 2015-10-21 DIAGNOSIS — Z89611 Acquired absence of right leg above knee: Secondary | ICD-10-CM | POA: Diagnosis not present

## 2015-10-21 DIAGNOSIS — I10 Essential (primary) hypertension: Secondary | ICD-10-CM | POA: Diagnosis present

## 2015-10-21 DIAGNOSIS — R072 Precordial pain: Secondary | ICD-10-CM

## 2015-10-21 DIAGNOSIS — D638 Anemia in other chronic diseases classified elsewhere: Secondary | ICD-10-CM | POA: Diagnosis present

## 2015-10-21 LAB — TROPONIN I
Troponin I: 0.1 ng/mL — ABNORMAL HIGH (ref <0.031)
Troponin I: 0.24 ng/mL — ABNORMAL HIGH (ref <0.031)
Troponin I: 0.31 ng/mL — ABNORMAL HIGH (ref <0.031)

## 2015-10-21 LAB — BRAIN NATRIURETIC PEPTIDE: B Natriuretic Peptide: 3090.9 pg/mL — ABNORMAL HIGH (ref 0.0–100.0)

## 2015-10-21 LAB — TSH: TSH: 1.063 u[IU]/mL (ref 0.350–4.500)

## 2015-10-21 MED ORDER — ONDANSETRON HCL 4 MG/2ML IJ SOLN: 4.0000 mg | Freq: Four times a day (QID) | INTRAMUSCULAR | Status: DC | PRN

## 2015-10-21 MED ORDER — ASPIRIN 81 MG PO CHEW: 324.0000 mg | CHEWABLE_TABLET | ORAL | Status: AC

## 2015-10-21 MED ORDER — HEPARIN SODIUM (PORCINE) 5000 UNIT/ML IJ SOLN
5000.0000 [IU] | Freq: Three times a day (TID) | INTRAMUSCULAR | Status: DC
  Administered 2015-10-21 – 2015-10-23 (×6): 5000 [IU] via SUBCUTANEOUS
  Filled 2015-10-21 (×5): qty 1

## 2015-10-21 MED ORDER — CLONIDINE HCL 0.1 MG PO TABS: 0.1000 mg | ORAL_TABLET | Freq: Four times a day (QID) | ORAL | Status: DC | PRN

## 2015-10-21 MED ORDER — RENA-VITE PO TABS
1.0000 | ORAL_TABLET | Freq: Every day | ORAL | Status: DC
  Administered 2015-10-21 – 2015-10-22 (×2): 1 via ORAL
  Filled 2015-10-21 (×2): qty 1

## 2015-10-21 MED ORDER — CARVEDILOL 25 MG PO TABS
25.0000 mg | ORAL_TABLET | Freq: Two times a day (BID) | ORAL | Status: DC
  Administered 2015-10-21 – 2015-10-23 (×5): 25 mg via ORAL
  Filled 2015-10-21 (×5): qty 1

## 2015-10-21 MED ORDER — BOOST / RESOURCE BREEZE PO LIQD
1.0000 | Freq: Three times a day (TID) | ORAL | Status: DC
  Administered 2015-10-21 – 2015-10-23 (×4): 1 via ORAL

## 2015-10-21 MED ORDER — AMLODIPINE BESYLATE 10 MG PO TABS
10.0000 mg | ORAL_TABLET | Freq: Every day | ORAL | Status: DC
  Administered 2015-10-22 – 2015-10-23 (×2): 10 mg via ORAL
  Filled 2015-10-21 (×3): qty 1

## 2015-10-21 MED ORDER — ACETAMINOPHEN 325 MG PO TABS: 650.0000 mg | ORAL_TABLET | ORAL | Status: DC | PRN

## 2015-10-21 MED ORDER — GABAPENTIN 100 MG PO CAPS
100.0000 mg | ORAL_CAPSULE | Freq: Every day | ORAL | Status: DC
  Administered 2015-10-21 – 2015-10-22 (×2): 100 mg via ORAL
  Filled 2015-10-21 (×2): qty 1

## 2015-10-21 MED ORDER — POLYVINYL ALCOHOL 1.4 % OP SOLN
1.0000 [drp] | OPHTHALMIC | Status: DC | PRN
  Filled 2015-10-21: qty 15

## 2015-10-21 MED ORDER — ASPIRIN 300 MG RE SUPP: 300.0000 mg | RECTAL | Status: AC

## 2015-10-21 MED ORDER — ATORVASTATIN CALCIUM 40 MG PO TABS
40.0000 mg | ORAL_TABLET | Freq: Every day | ORAL | Status: DC
Start: 2015-10-21 — End: 2015-10-23
  Administered 2015-10-21 – 2015-10-22 (×2): 40 mg via ORAL
  Filled 2015-10-21 (×2): qty 1

## 2015-10-21 MED ORDER — LISINOPRIL 10 MG PO TABS
10.0000 mg | ORAL_TABLET | Freq: Every day | ORAL | Status: DC
  Administered 2015-10-21 – 2015-10-23 (×4): 10 mg via ORAL
  Filled 2015-10-21 (×3): qty 1

## 2015-10-21 MED ORDER — SEVELAMER CARBONATE 800 MG PO TABS
1600.0000 mg | ORAL_TABLET | ORAL | Status: DC
  Administered 2015-10-21 (×2): 1600 mg via ORAL
  Filled 2015-10-21 (×2): qty 2

## 2015-10-21 MED ORDER — SEVELAMER CARBONATE 800 MG PO TABS
2400.0000 mg | ORAL_TABLET | Freq: Three times a day (TID) | ORAL | Status: DC
  Administered 2015-10-21 – 2015-10-23 (×6): 2400 mg via ORAL
  Filled 2015-10-21 (×6): qty 3

## 2015-10-21 MED ORDER — BRIMONIDINE TARTRATE 0.2 % OP SOLN
1.0000 [drp] | Freq: Two times a day (BID) | OPHTHALMIC | Status: DC
  Administered 2015-10-21 – 2015-10-22 (×3): 1 [drp] via OPHTHALMIC
  Filled 2015-10-21: qty 5

## 2015-10-21 MED ORDER — NON FORMULARY: 1.0000 [drp] | Freq: Two times a day (BID) | Status: DC

## 2015-10-21 MED ORDER — NITROGLYCERIN 0.4 MG SL SUBL
0.4000 mg | SUBLINGUAL_TABLET | SUBLINGUAL | Status: DC | PRN
  Administered 2015-10-21 (×2): 0.4 mg via SUBLINGUAL

## 2015-10-21 MED ORDER — LATANOPROST 0.005 % OP SOLN
1.0000 [drp] | Freq: Every day | OPHTHALMIC | Status: DC
  Administered 2015-10-21 – 2015-10-22 (×2): 1 [drp] via OPHTHALMIC
  Filled 2015-10-21: qty 2.5

## 2015-10-21 MED ORDER — ASPIRIN 81 MG PO CHEW
81.0000 mg | CHEWABLE_TABLET | Freq: Every day | ORAL | Status: DC
  Administered 2015-10-22 – 2015-10-23 (×2): 81 mg via ORAL
  Filled 2015-10-21 (×3): qty 1

## 2015-10-21 MED ORDER — OXYCODONE HCL 5 MG PO TABS
10.0000 mg | ORAL_TABLET | ORAL | Status: DC | PRN
  Administered 2015-10-21 – 2015-10-23 (×6): 10 mg via ORAL
  Filled 2015-10-21 (×5): qty 2

## 2015-10-21 MED ORDER — NITROGLYCERIN 2 % TD OINT
0.5000 [in_us] | TOPICAL_OINTMENT | Freq: Four times a day (QID) | TRANSDERMAL | Status: DC
  Administered 2015-10-21 – 2015-10-22 (×3): 0.5 [in_us] via TOPICAL
  Filled 2015-10-21: qty 30

## 2015-10-21 MED ORDER — NITROGLYCERIN 0.4 MG SL SUBL: 0.4000 mg | SUBLINGUAL_TABLET | SUBLINGUAL | Status: DC | PRN

## 2015-10-21 MED ORDER — BRINZOLAMIDE 1 % OP SUSP
1.0000 [drp] | Freq: Two times a day (BID) | OPHTHALMIC | Status: DC
  Administered 2015-10-21 – 2015-10-22 (×3): 1 [drp] via OPHTHALMIC
  Filled 2015-10-21: qty 10

## 2015-10-21 MED ORDER — NITROGLYCERIN 0.4 MG SL SUBL
SUBLINGUAL_TABLET | SUBLINGUAL | Status: AC
  Administered 2015-10-21: 0.4 mg
  Filled 2015-10-21: qty 1

## 2015-10-21 MED ORDER — CALCITRIOL 0.5 MCG PO CAPS
0.5000 ug | ORAL_CAPSULE | Freq: Every day | ORAL | Status: DC
  Administered 2015-10-21 – 2015-10-23 (×3): 0.5 ug via ORAL
  Filled 2015-10-21 (×3): qty 1

## 2015-10-21 NOTE — Progress Notes (Signed)
0830 10/21/15 nursing Patient claims he feels better  After Nitro still feels clammy  Filed Vitals:   10/21/15 0814 10/21/15 0830  BP: 203/89 161/73  Pulse: 104 171  Temp:  98.5 F (36.9 C)  Resp:    MD notified of vital signs. IV team called for PIV . Patient to be transferred to tele   Bed per MD. Continued to monitor patient. Daughter in MD spoke with her.

## 2015-10-21 NOTE — Significant Event (Signed)
Rapid Response Event Note  Overview: Time Called: 0808 Arrival Time: 0810 Event Type: Cardiac  Initial Focused Assessment:  Called by RN for CP 10/10.  CP is in the center of his chest, states does not radiate, but is is burning.  Patient is diaphoretic, sitting up in bed holding chest, on nasal cannula 2 LPM..  BP 203/89, HR104   Interventions:  EKG done prior to my arrival.  MD at bedside.  Nitro given by primary RN.  PIV attempted unsuccessful.  Patient states he is feeling better and pain is subsiding.  Patient is more relaxed 161/73  Event Summary:  Rn to call if assistance needed   at      at          [facility], Harlin Rain

## 2015-10-21 NOTE — Progress Notes (Signed)
10/21/15 0750 nursing Patient complained of chest pain claims it is more of burning type . Vital signs 190/100 107 98.8 98%RA. EKG done . Dr. Alain Marion notified by night RN no orders at the moment. Patient asks for his blood pressure meds . RN gave daily and prn meds. Patient remained alert and oriented and claims he just need some rest. RN stayed with patient.   [id number] 10/21/15 nursing Patient still c/o chest pain rapid response  called md notified

## 2015-10-21 NOTE — Progress Notes (Signed)
10/21/15 0910 nursing Report given to receiving RN. Hospitalist in room with patient . New orders noted.

## 2015-10-21 NOTE — Progress Notes (Signed)
Orthopedic Tech Progress Note Patient Details:  Randy [name] [birth year] [id number]  Patient ID: [name], male   DOB: 10-13-1931, 82 y.o.   MRN: [id number]   [name] 10/21/2015, 9:46 [facility] [employer] for right wrist brace.

## 2015-10-21 NOTE — Progress Notes (Signed)
10/21/15 1020 nursing  To unit 3W accompanied by RN and NT per bed w/ 02 on at 2L/m. Family with patient on transfer.

## 2015-10-21 NOTE — H&P (Signed)
[facility] History and Physical  Randy [name] [id number] DOB: 21-Jul-1932 DOA: 10/21/2015  Referring physician:  Lew Dawes PCP:  [name], MD   Chief Complaint:  Chest pain  HPI:  The patient is a 82 y.o. year-old male with history of ESRD on MWF HD, CAD with stable angina, HLD, HTN, PVD s/p right BKA who was admitted for AKA secondary to gangrene on 10/09/2105.  He underwent left above-the-knee amputation on 10/10/2015 by Dr Doren Custard.  He had a small amount of postoperative bleeding postprocedure but otherwise no other Locations. He was transitioned to inpatient rehabilitation for aggressive physical occupational therapy where he has been since 1/19. The patient has a known history of chest pains when his blood pressure is high and is followed by cardiology. He has almost daily morning chest pains when his blood pressure is elevated and then over the course of the day his blood pressure comes down in his chest pains tend to improve. His blood pressures dropped during hemodialysis and so cardiology and his specialists have been hesitant to increase his blood pressure medications very much.  His last cardiac catheterization was in November 2015 which demonstrated nonobstructive CAD with 40-50% proximal LAD stenosis, 30% mid LAD stenosis at D2, 80% proximal stenosis of both divisions of the ramus however the left circumflex only had luminal irregularities. RCA had up to 40% proximal and mid vessel stenosis.  This is medically managed.  He was admitted in November 2016 with chest pains and had a mild elevation of his troponin up to 1.09 which was again medically managed.  This morning, he developed sudden onset upper chest burning 10/10 that did not radiate to the shoulders, arms, neck, or jaw.  He had some associated diaphoresis, lightheadedness, and SOB similar to previous episodes at home associated with high blood pressure.  NTG improved his pain.  His blood pressure was as high as  203/89.  Incidentally, he has also had some increased SOB for the last two days and a mildly elevated temperature to 99.63F without cough.  He was recently on doxycycline for bronchitis which was stopped.    Review of Systems:  General:  Denies fevers, chills, weight loss or gain HEENT:  Denies changes to hearing and vision, rhinorrhea, sinus congestion, sore throat CV:  Denies palpitations, lower extremity edema.  PULM:  Denies SOB, wheezing, cough.   GI:  Denies nausea, vomiting, constipation, diarrhea.   GU:  anuric ENDO:  Denies polyuria, polydipsia.   HEME:  Denies hematemesis, blood in stools, melena, abnormal bruising or bleeding.  LYMPH:  Denies lymphadenopathy.   MSK:  Pain in LLE with phantom pain, myalgias.   DERM:  Denies skin rash or ulcer.   NEURO:  Denies focal numbness, weakness, slurred speech, confusion, facial droop.  PSYCH:  Denies anxiety and depression.    Past Medical History  Diagnosis Date  . Hypercholesterolemia   . Gout   . Nonischemic cardiomyopathy (HCC)     LVEF 35-40%  . Anemia of chronic disease   . Essential hypertension   . Arthritis   . Peripheral vascular disease ([place])     Status post right below knee amputation for a nonhealing wound of the right foot 12/2014  . History of pneumonia 2014  . CAD (coronary artery disease)     Moderate multivessel disease 07/2015 - managed medically  . History of myopericarditis 2015    07/2015  . Stroke [facility])     "they said I did"  "I did  not know anything about it"  . ESRD on hemodialysis [facility])     M/W/F in [place] - Dr. Florene Glen  . Constipation   . History of blood transfusion   . Pneumonia   . Acute myopericarditis 2015   Past Surgical History  Procedure Laterality Date  . Knee arthroscopy Right 2007  . Back surgery    . Hemorrhoid surgery  1970's  . Ganglion cyst excision  01/03/2012    Procedure: REMOVAL GANGLION OF WRIST;  Surgeon: Scherry Ran, MD;  Location: [facility];  Service: General;   Laterality: Right;  . Colonoscopy    . Av fistula placement  08/24/2012    Procedure: ARTERIOVENOUS (AV) FISTULA CREATION;  Surgeon: Rosetta Posner, MD;  Location: [place];  Service: Vascular;  Laterality: Left;  . Insertion of dialysis catheter Right      neck  . Insertion of dialysis catheter  10/19/2012    Procedure: INSERTION OF DIALYSIS CATHETER;  Surgeon: Rosetta Posner, MD;  Location: [place];  Service: Vascular;  Laterality: N/A;  REMOVE TEMPORARY CATH  . Cholecystectomy    . Lumbar disc surgery  2004  . Left heart catheterization with coronary angiogram N/A 08/17/2014    Procedure: LEFT HEART CATHETERIZATION WITH CORONARY ANGIOGRAM;  Surgeon: Larey Dresser, MD;  Location: [facility] CATH LAB;  Service: Cardiovascular;  Laterality: N/A;  . Colonoscopy Left 09/26/2014    Procedure: COLONOSCOPY;  Surgeon: Arta Silence, MD;  Location: [facility] ENDOSCOPY;  Service: Endoscopy;  Laterality: Left;  . Amputation Right 01/05/2015    Procedure: AMPUTATION BELOW KNEE;  Surgeon: Angelia Mould, MD;  Location: [place];  Service: Vascular;  Laterality: Right;  . Capd removal N/A 03/13/2015    Procedure: CONTINUOUS AMBULATORY PERITONEAL DIALYSIS  (CAPD) CATHETER REMOVAL;  Surgeon: Coralie Keens, MD;  Location: [place];  Service: General;  Laterality: N/A;  . Cardiac catheterization    . Above knee leg amputation Left 10/10/2015  . Amputation Left 10/10/2015    Procedure: AMPUTATION ABOVE KNEE LEFT;  Surgeon: Angelia Mould, MD;  Location: [place];  Service: Vascular;  Laterality: Left;   Social History:  reports that he quit smoking about 17 years ago. His smoking use included Cigarettes. He has a 30 pack-year smoking history. He quit smokeless tobacco use about 21 years ago. His smokeless tobacco use included Chew. He reports that he does not drink alcohol or use illicit drugs.  No Known Allergies  Family History  Problem Relation Age of Onset  . Arthritis    . Cancer    . Kidney disease    . Anesthesia  problems Neg Hx   . Hypotension Neg Hx   . Malignant hyperthermia Neg Hx   . Pseudochol deficiency Neg Hx   . Cancer Sister   . Colon cancer Brother   . Colon cancer Brother      Prior to Admission medications   Medication Sig Start Date End Date Taking? Authorizing Provider  amLODipine (NORVASC) 10 MG tablet Take 1 tablet (10 mg total) by mouth daily. 06/23/14   Lucia Gaskins, MD  aspirin 81 MG tablet Take 81 mg by mouth daily.    Historical Provider, MD  atorvastatin (LIPITOR) 40 MG tablet Take 1 tablet (40 mg total) by mouth daily at 6 PM. 01/13/15   Barton Dubois, MD  calcitRIOL (ROCALTROL) 0.5 MCG capsule Take 1 capsule (0.5 mcg total) by mouth daily. 06/23/14   Lucia Gaskins, MD  carvedilol (COREG) 25 MG tablet Take 1  tablet (25 mg total) by mouth 2 (two) times daily with a meal. 01/13/15   Barton Dubois, MD  cloNIDine (CATAPRES) 0.2 MG tablet Take 0.2 mg by mouth 2 (two) times daily as needed (blood pressure >130/27).  12/27/14   Historical Provider, MD  doxycycline (VIBRA-TABS) 100 MG tablet Take 1 tablet (100 mg total) by mouth every 12 (twelve) hours. 01/13/15   Barton Dubois, MD  feeding supplement, RESOURCE BREEZE, (RESOURCE BREEZE) LIQD Take 1 Container by mouth 3 (three) times daily between meals. 09/30/14   Annita Brod, MD  gabapentin (NEURONTIN) 100 MG capsule Take 1 capsule (100 mg total) by mouth 3 (three) times daily. Patient taking differently: Take 100 mg by mouth at bedtime.  01/22/15   Leonard Schwartz, MD  hydrALAZINE (APRESOLINE) 25 MG tablet Take 1 tablet (25 mg total) by mouth 3 (three) times daily. Patient taking differently: Take 25 mg by mouth 3 (three) times daily as needed (High blood pressure).  08/23/14   Rhonda G Barrett, PA-C  isosorbide mononitrate (IMDUR) 30 MG 24 hr tablet Take 0.5 tablets (15 mg total) by mouth at bedtime. 09/20/15   Arnoldo Lenis, MD  lisinopril (PRINIVIL,ZESTRIL) 10 MG tablet Take 1 tablet by mouth daily. 01/24/15   Historical  Provider, MD  LUMIGAN 0.01 % SOLN Place 1 drop into both eyes at bedtime. 09/18/15   Historical Provider, MD  multivitamin (RENA-VIT) TABS tablet Take 1 tablet by mouth daily.    Historical Provider, MD  Oxycodone HCl 10 MG TABS Take 10 mg by mouth every 6 (six) hours as needed (for pain).  09/19/15   Historical Provider, MD  oxyCODONE-acetaminophen (PERCOCET/ROXICET) 5-325 MG per tablet Take 1 tablet by mouth every 6 (six) hours as needed for severe pain. 03/14/15   Coralie Keens, MD  predniSONE (DELTASONE) 20 MG tablet Take 2 tablets by mouth daily X 2 days; then 1 tablet by mouth daily X 2 days; then 1/2 tablet by mouth daily X 3 days and stop prednisone 01/13/15   Barton Dubois, MD  sevelamer carbonate (RENVELA) 800 MG tablet Take 1,600-2,400 mg by mouth See admin instructions. Take 3 tablets (2400 mg) with meals and 2 tablets (1600 mg) with snacks    Historical Provider, MD  SIMBRINZA 1-0.2 % SUSP Place 1 drop into both eyes 2 (two) times daily. 09/18/15   Historical Provider, MD   Physical Exam: There were no vitals filed for this visit.   General:  Thin adult male, no acute distress  Eyes:  PERRL, anicteric, non-injected.  ENT:  Nares clear.  OP clear, non-erythematous without plaques or exudates.  MMM.  Neck:  Supple without TM or JVD.    Lymph:  No cervical, supraclavicular, or submandibular LAD.  Cardiovascular:  RRR, normal S1, S2, 3-6 systolic murmur heard best at the left lower sternal border but radiating across precordium and to the right upper sternal border.  2+ pulses, warm extremities  Respiratory:  Diminished breath sounds at the left lower base with some faint rales, no rhonchi or wheeze without increased WOB.  Abdomen:  NABS.  Soft, ND/NT.    Skin:  No rashes or focal lesions. Incision along the left leg has staples, edges appear well approximated without induration, erythema, or discharge  Musculoskeletal:  Normal bulk and tone.  1+ edema of the left lower  extremity.  Psychiatric:  A & O to person, place, month and year and day of week.  Appropriate affect.  Neurologic:  CN 3-12 intact.  5/5  strength upper extremities.  Has bilateral lower extremity amputation's  Labs on Admission:  Basic Metabolic Panel:  Recent Labs Lab 10/16/15 1935 10/18/15 1228 10/19/15 0201 10/20/15 1600  NA 128* 134* 131* 135  K 6.1* 5.2* 5.6* 4.8  CL 90* 94* 91* 94*  CO2 22 26 24 28   GLUCOSE 117* 124* 86 162*  BUN 101* 72* 80* 57*  CREATININE 11.74* 8.90* 9.78* 7.60*  CALCIUM 9.0 9.2 8.6* 8.9  PHOS 5.8*  --  6.9* 5.8*   Liver Function Tests:  Recent Labs Lab 10/16/15 1935 10/19/15 0201 10/20/15 1600  ALBUMIN 2.3* 1.9* 1.9*   No results for input(s): LIPASE, AMYLASE in the last 168 hours. No results for input(s): AMMONIA in the last 168 hours. CBC:  Recent Labs Lab 10/19/15 0201 10/20/15 0905  WBC 5.9 5.2  NEUTROABS  --  3.8  HGB 8.6* 9.3*  HCT 28.8* 30.5*  MCV 96.0 97.8  PLT 218 254   Cardiac Enzymes:  Recent Labs Lab 10/21/15 0936  TROPONINI 0.10*    BNP (last 3 results)  Recent Labs  01/08/15 1118 08/04/15 0625 10/21/15 0936  BNP >4500.0* 2452.0* 3090.9*    ProBNP (last 3 results) No results for input(s): PROBNP in the last 8760 hours.  CBG: No results for input(s): GLUCAP in the last 168 hours.  Radiological Exams on Admission: Dg Wrist Complete Right  10/20/2015  CLINICAL DATA:  Acute right wrist pain without known injury. EXAM: RIGHT WRIST - COMPLETE 3+ VIEW COMPARISON:  July 10, 2012. FINDINGS: There is no evidence of fracture or dislocation. Vascular calcifications are noted. Narrowing and sclerosis of the first carpometacarpal joint as well as several intercarpal joints is noted. IMPRESSION: Findings consistent with osteoarthritis involving the first carpometacarpal and several intercarpal joints. No acute abnormality seen in the right wrist. Electronically Signed   By: Marijo Conception, M.D.   On: 10/20/2015  09:39   Dg Chest Port 1 View  10/21/2015  CLINICAL DATA:  Dyspnea and mid chest pain EXAM: PORTABLE CHEST 1 VIEW COMPARISON:  08/04/2015; 03/13/2015; 10/20/2014; 09/22/2014; 03/28/2014; 10/08/2012 FINDINGS: Grossly unchanged enlarged cardiac silhouette and mediastinal contours with atherosclerotic plaque within the thoracic aorta. Grossly unchanged small left-sided effusion with associated left basilar/retrocardiac heterogeneous/consolidative opacities. No new focal airspace opacities. Chronic pulmonary venous congestion without frank evidence of edema. No pneumothorax. Unchanged bones. IMPRESSION: 1. Stable examination without definite superimposed acute cardiopulmonary disease 2. Similar findings of cardiomegaly, pulmonary venous congestion, chronic left-sided effusion and associated left basilar opacities, likely atelectasis or scar. Electronically Signed   By: Sandi Mariscal M.D.   On: 10/21/2015 09:56    EKG: Independently reviewed.  Similar to prior with NSR and deep T-wave inversions in the inferolateral leads  Assessment/Plan Principal Problem:   Chest pain  ---  Chest pain in patient with known CAD concerning for ACS vs. Malignant hypertension given diaphoresis and SOB, particuarly with resolution with NTG.  Alternatively, he may be developing a left lower lobe pneumonia or have pill esophagitis from his recent doxycycline.  Initial troponin only mildly elevated c/w ESRD and lower than previous levels.   -  Telemetry -  Cycle troponins and ECG -  A1c & lipid panel -  Daily aspirin -  Beta blocker and high dose statin -  Start NTG paste -  Cardiology consultation -  CXR without infiltrate, no leukocytosis and just stopped doxy so will not treat for pneumonia at this time  PVD with recent gangrene of the left foot s/p  left AKA on 1/17 by Dr. Scot Dock -  Surgical site appears to be healing well -  Continue PT/OT -  Continue oxycodone for pain control  Hypertension, elevated, but  trending down.   -  Appears euvolemic but may have extra volume contributing to high blood pressure -  Will order additional prn doses of clonidine in addition to previous BP medications  Chronic systolic and diastolic CHF with EF [id number] on ECHO from 07/2015 with severe hypokinesis of the basal-midinferolateral and inferior myocardium.  Does not appear volume overloaded on exam  Moderate AS Mean gradient (S): 16 mm Hg. VTI ratio of LVOT to aortic valve: 0.4. Valve area (VTI): 1.24 cm^2. Valve area (Vmax): 1.21 cm^2.  ESRD with HD MWF,  -  Appreciate nephrology assistance  Anemia of renal parenchymal disease -  Occult stool negative today -  Managed by nephrology  Diet:  renal Access:  PIV IVF:  off Proph:  heparin  Code Status: Full Family Communication: patient and his two daughters at bedside Disposition Plan: Admit to telemetry  Time spent: 60 min Janece Canterbury [facility] Pager [phone]  If 7PM-7AM, please contact night-coverage [email or web address] Password [email or web address] 10/21/2015, 11:10 AM

## 2015-10-21 NOTE — Progress Notes (Signed)
Randy [name] is a 82 y.o. male 05-19-1932 [id number]  Subjective: C/o mid-sternal severe CP 10/10  "all the way up there on the scale" that started at breakfast this am  Objective: Vital signs in last 24 hours: Temp:  [97.6 F (36.4 C)-99.9 F (37.7 C)] 98.8 F (37.1 C) (01/28 0745) Pulse Rate:  [71-107] 104 (01/28 0814) Resp:  [16-18] 18 (01/28 0745) BP: (110-203)/(43-100) 203/89 mmHg (01/28 0814) SpO2:  [97 %-100 %] 100 % (01/28 0814) Weight:  [130 lb 1.1 oz (59 kg)-133 lb 14.4 oz (60.737 kg)] 130 lb 1.1 oz (59 kg) (01/28 0500) Weight change: -12.8 oz (-0.363 kg) Last BM Date: 10/20/15  Intake/Output from previous day: 01/27 0701 - 01/28 0700 In: 720 [P.O.:720] Out: 1200  Last cbgs: CBG (last 3)  No results for input(s): GLUCAP in the last 72 hours.   Physical Exam General: in some  distress due to CP, sweaty HEENT: not dry Lungs: Normal effort. Lungs clear to auscultation, no crackles or wheezes. Cardiovascular: Regular rate and rhythm, no edema Abdomen: S/NT/ND; BS(+) Musculoskeletal:  unchanged Neurological: No new neurological deficits Wounds: s/p L AKA - wound dressed, R BKA Skin: clear  Aging changes Mental state: Alert, oriented, cooperative    Lab Results: BMET    Component Value Date/Time   NA 135 10/20/2015 1600   NA 136 02/22/2013 1643   K 4.8 10/20/2015 1600   K 3.6 02/22/2013 1643   CL 94* 10/20/2015 1600   CL 103 02/22/2013 1643   CO2 28 10/20/2015 1600   CO2 24 02/22/2013 1643   GLUCOSE 162* 10/20/2015 1600   GLUCOSE 87 02/22/2013 1643   BUN 57* 10/20/2015 1600   BUN 48* 02/22/2013 1643   CREATININE 7.60* 10/20/2015 1600   CREATININE 6.02* 02/22/2013 1643   CALCIUM 8.9 10/20/2015 1600   CALCIUM 8.9 02/22/2013 1643   CALCIUM 8.1* 06/24/2012 1634   GFRNONAA 6* 10/20/2015 1600   GFRNONAA 8* 02/22/2013 1643   GFRAA 7* 10/20/2015 1600   GFRAA 9* 02/22/2013 1643   CBC    Component Value Date/Time   WBC 5.2 10/20/2015 0905   WBC 5.3  02/22/2013 1643   RBC 3.12* 10/20/2015 0905   RBC 3.65* 02/22/2013 1643   RBC 3.21* 10/08/2012 2241   HGB 9.3* 10/20/2015 0905   HGB 11.3* 02/22/2013 1643   HCT 30.5* 10/20/2015 0905   HCT 33.6* 02/22/2013 1643   PLT 254 10/20/2015 0905   PLT 240 02/22/2013 1643   MCV 97.8 10/20/2015 0905   MCV 92 02/22/2013 1643   MCH 29.8 10/20/2015 0905   MCH 30.9 02/22/2013 1643   MCHC 30.5 10/20/2015 0905   MCHC 33.6 02/22/2013 1643   RDW 18.4* 10/20/2015 0905   RDW 15.3* 02/22/2013 1643   LYMPHSABS 0.7 10/20/2015 0905   MONOABS 0.6 10/20/2015 0905   EOSABS 0.1 10/20/2015 0905   BASOSABS 0.0 10/20/2015 0905   EKG ectopic A tachy - no new changes  Studies/Results: Dg Wrist Complete Right  10/20/2015  CLINICAL DATA:  Acute right wrist pain without known injury. EXAM: RIGHT WRIST - COMPLETE 3+ VIEW COMPARISON:  July 10, 2012. FINDINGS: There is no evidence of fracture or dislocation. Vascular calcifications are noted. Narrowing and sclerosis of the first carpometacarpal joint as well as several intercarpal joints is noted. IMPRESSION: Findings consistent with osteoarthritis involving the first carpometacarpal and several intercarpal joints. No acute abnormality seen in the right wrist. Electronically Signed   By: Marijo Conception, M.D.   On:  10/20/2015 09:39    Medications: I have reviewed the patient's current medications.  Assessment/Plan:   1. Decreased mobility and self care secondary to Left AKA with prior hx of R BKA -continue therapies 15/7 2. DVT Prophylaxis/Anticoagulation: Pharmaceutical: Lovenox 3. Pain Management:  Oxycodone IR PRN, Robaxin BID and Oxycontin 10 qhs. Compression/massage/elevation Muscle cream added for right shoulder/neck stiffness 4. Mood: monitor for reactive depression 5. Neuropsych: This patient is capable of making decisions on his own behalf. 6. Skin/Wound Care: Dressing changes and ACE wrap to LLE. 7.  Fluids/Electrolytes/Nutrition: Monitor I/Os, meal intake, may need dietary consult Eating 80-100% of meals 8. HTN: severe- 201/89 this am;  norvasc, lisinopril, coreg, imdur, with prn hydralazine Imdur increased on 1/24, amlodipine d/ced on 1/25 Remains labile, however, fairly well controlled 9. ESRD: HD managed by Nephro M-W F. HD after therapies each day 10 Anemia of chronic disease: Aranesp weekly in HD With ABLA Hb 8.6 on 1/26 Hemoccult stools pending Will repeat labs today Will cont to monitor periodically 11. NISCM/CAD:Lipitor , ASA, Coreg 12. CP this am - severe, likely related to his CAD. Labs, EKG. Better w/NTG. On O2. Better w/NTG SL. Will need to transfer to telemetry to r/o MI Team 11. He is a full code.   Length of stay, days: [place] , MD 10/21/2015, 8:17 AM

## 2015-10-22 ENCOUNTER — Observation Stay (HOSPITAL_COMMUNITY): Payer: Medicare Other

## 2015-10-22 ENCOUNTER — Inpatient Hospital Stay (HOSPITAL_COMMUNITY): Payer: Medicare Other | Admitting: *Deleted

## 2015-10-22 DIAGNOSIS — I209 Angina pectoris, unspecified: Secondary | ICD-10-CM

## 2015-10-22 DIAGNOSIS — D638 Anemia in other chronic diseases classified elsewhere: Secondary | ICD-10-CM | POA: Diagnosis not present

## 2015-10-22 DIAGNOSIS — R7989 Other specified abnormal findings of blood chemistry: Secondary | ICD-10-CM | POA: Diagnosis not present

## 2015-10-22 DIAGNOSIS — R079 Chest pain, unspecified: Principal | ICD-10-CM

## 2015-10-22 DIAGNOSIS — K219 Gastro-esophageal reflux disease without esophagitis: Secondary | ICD-10-CM

## 2015-10-22 DIAGNOSIS — I10 Essential (primary) hypertension: Secondary | ICD-10-CM | POA: Diagnosis not present

## 2015-10-22 DIAGNOSIS — I25119 Atherosclerotic heart disease of native coronary artery with unspecified angina pectoris: Secondary | ICD-10-CM | POA: Diagnosis not present

## 2015-10-22 DIAGNOSIS — I5042 Chronic combined systolic (congestive) and diastolic (congestive) heart failure: Secondary | ICD-10-CM | POA: Diagnosis not present

## 2015-10-22 LAB — BASIC METABOLIC PANEL
Anion gap: 9 (ref 5–15)
BUN: 14 mg/dL (ref 6–20)
CO2: 24 mmol/L (ref 22–32)
CREATININE: 1 mg/dL (ref 0.61–1.24)
Calcium: 8.4 mg/dL — ABNORMAL LOW (ref 8.9–10.3)
Chloride: 101 mmol/L (ref 101–111)
GFR calc Af Amer: >60 mL/min (ref >60)
GLUCOSE: 277 mg/dL — AB (ref 65–99)
Potassium: 4 mmol/L (ref 3.5–5.1)
SODIUM: 134 mmol/L — AB (ref 135–145)

## 2015-10-22 LAB — CBC
HCT: 29.1 % — ABNORMAL LOW (ref 39.0–52.0)
HEMOGLOBIN: 8.7 g/dL — AB (ref 13.0–17.0)
MCH: 29.3 pg (ref 26.0–34.0)
MCHC: 29.9 g/dL — ABNORMAL LOW (ref 30.0–36.0)
MCV: 98 fL (ref 78.0–100.0)
PLATELETS: 267 10*3/uL (ref 150–400)
RBC: 2.97 MIL/uL — AB (ref 4.22–5.81)
RDW: 18.7 % — ABNORMAL HIGH (ref 11.5–15.5)
WBC: 5.5 10*3/uL (ref 4.0–10.5)

## 2015-10-22 LAB — LIPID PANEL
CHOL/HDL RATIO: 9.4 ratio
CHOLESTEROL: 255 mg/dL — AB (ref 0–200)
HDL: 27 mg/dL — ABNORMAL LOW (ref >40)
LDL Cholesterol: UNDETERMINED mg/dL (ref 0–99)
Triglycerides: 1030 mg/dL — ABNORMAL HIGH (ref <150)
VLDL: UNDETERMINED mg/dL (ref 0–40)

## 2015-10-22 LAB — TROPONIN I: TROPONIN I: 0.29 ng/mL — AB (ref <0.031)

## 2015-10-22 MED ORDER — PANTOPRAZOLE SODIUM 40 MG PO TBEC
40.0000 mg | DELAYED_RELEASE_TABLET | Freq: Every day | ORAL | Status: DC
  Administered 2015-10-22: 40 mg via ORAL
  Filled 2015-10-22: qty 1

## 2015-10-22 NOTE — Consult Note (Signed)
Patient ID: Randy [name] MRN: [id number], DOB/AGE: May 20, 1932   Admit date: 10/21/2015   Primary Physician: [name], MD Primary Cardiologist: Dr. Harl Bowie  Pt. Profile:  82 y/o male, followed by Dr. Harl Bowie, with a h/o CAD, chronic combined systolic + diastolic CHF, HTN, AS, ESRD on HD (MWF), HLD, and severe PAD s/p recent right BKA and left AKA 10/09/14 who was readmitted from inpatient rehab for chest pain.   Problem List  Past Medical History  Diagnosis Date  . Hypercholesterolemia   . Gout   . Nonischemic cardiomyopathy (HCC)     LVEF 35-40%  . Anemia of chronic disease   . Essential hypertension   . Arthritis   . Peripheral vascular disease ([place])     Status post right below knee amputation for a nonhealing wound of the right foot 12/2014  . History of pneumonia 2014  . CAD (coronary artery disease)     Moderate multivessel disease 07/2015 - managed medically  . History of myopericarditis 2015    07/2015  . Stroke [facility])     "they said I did"  "I did not know anything about it"  . ESRD on hemodialysis [facility])     M/W/F in [place] - Dr. Florene Glen  . Constipation   . History of blood transfusion   . Pneumonia   . Acute myopericarditis 2015    Past Surgical History  Procedure Laterality Date  . Knee arthroscopy Right 2007  . Back surgery    . Hemorrhoid surgery  1970's  . Ganglion cyst excision  01/03/2012    Procedure: REMOVAL GANGLION OF WRIST;  Surgeon: Scherry Ran, MD;  Location: [facility];  Service: General;  Laterality: Right;  . Colonoscopy    . Av fistula placement  08/24/2012    Procedure: ARTERIOVENOUS (AV) FISTULA CREATION;  Surgeon: Rosetta Posner, MD;  Location: [place];  Service: Vascular;  Laterality: Left;  . Insertion of dialysis catheter Right      neck  . Insertion of dialysis catheter  10/19/2012    Procedure: INSERTION OF DIALYSIS CATHETER;  Surgeon: Rosetta Posner, MD;  Location: [place];  Service: Vascular;  Laterality: N/A;  REMOVE  TEMPORARY CATH  . Cholecystectomy    . Lumbar disc surgery  2004  . Left heart catheterization with coronary angiogram N/A 08/17/2014    Procedure: LEFT HEART CATHETERIZATION WITH CORONARY ANGIOGRAM;  Surgeon: Larey Dresser, MD;  Location: [facility] CATH LAB;  Service: Cardiovascular;  Laterality: N/A;  . Colonoscopy Left 09/26/2014    Procedure: COLONOSCOPY;  Surgeon: Arta Silence, MD;  Location: [facility] ENDOSCOPY;  Service: Endoscopy;  Laterality: Left;  . Amputation Right 01/05/2015    Procedure: AMPUTATION BELOW KNEE;  Surgeon: Angelia Mould, MD;  Location: [place];  Service: Vascular;  Laterality: Right;  . Capd removal N/A 03/13/2015    Procedure: CONTINUOUS AMBULATORY PERITONEAL DIALYSIS  (CAPD) CATHETER REMOVAL;  Surgeon: Coralie Keens, MD;  Location: [place];  Service: General;  Laterality: N/A;  . Cardiac catheterization    . Above knee leg amputation Left 10/10/2015  . Amputation Left 10/10/2015    Procedure: AMPUTATION ABOVE KNEE LEFT;  Surgeon: Angelia Mould, MD;  Location: [place];  Service: Vascular;  Laterality: Left;     Allergies  No Known Allergies  HPI  82 y/o male, followed by Dr. Harl Bowie, with a h/o CAD, chronic combined systolic + diastolic CHF, HTN, AS, ESRD on HD (MWF), HLD, and severe PAD s/p recent right  BKA and left AKA 10/09/14 who was readmitted from inpatient rehab for chest pain.  Cath in 07/2014 showed diffuse, mainly moderate disease, most significnat disease was an 80% ramus that was managed medically ( 40-50% proximal LAD stenosis, 30% mid LAD stenosis at D2, 80% proximal stenosis of both divisions of the ramus however the left circumflex only had luminal irregularities. RCA had up to 40% proximal and mid vessel stenosis). His most recent echo 07/2015 showed a LVEF of 30-35% with restrictive diasotlic function. Prior echo showed EF to be 35-40%. AS is felt to be mild-moderate with echo 07/2015 showing an AVA of 1.2 cm2 and a mean gradient of 16 mmHg.   He  was last seen by Dr. Harl Bowie 09/20/15. That that time, decision was made to optimize HF meds and repeat echo in several months before considering possible need for ICD. Per note, the patient had also noted chest pain upon wakening in the mornings. Dr. Harl Bowie recommended initiation of 15 mg of Imdur nightly. He has also had issues with hypotension during HD, thus limiting aggressive dosing of a lot of his meds for HTN, CAD and CHF.   His PVD is followed by Dr. Scot Dock. He recently presented with progressive ischemia of the left leg and a nonhealing wound, but wasn't a candidate for revascularization. Subsequently, he underwent AKA of the LLE as well as BKA of the right, by Dr. Scot Dock on 10/10/15. After inpatient recovery, he was transitioned to inpatient rehabilitation for aggressive physical occupational therapy where he has been since 1/19.   Earlier this morning, 10/21/15, he developed sudden onset of severe CP, associated with diaphoresis, lightheadedness and SOB. Symptoms improved with NTG. His BP was also noted to be severely elevated in the low 123456 systolic at the time. He was transferred to Inpatient Care and admitted by [facility]. EKG and telemetry monitoring has shown no ischemic changes. His troponin level is mildly elevated x 3 at 0.24, 0.31 and 0.29, however this is in the setting of ESRD. His BP is better controlled at 125/62. His Hgb is 8.7 (previously 10.2 10 days ago and 9.3 at time of recent discharge). His current medical management includes 10 mg of amlodipine, 25 mg of Coreg BID, 10 mg of Lisinopril, 40 of Lipitor and 81 mg of ASA. Imdur currently not ordered.   The patient reports that he feels much better. No further CP. He notes that he always develops CP when is BP is high.     Home Medications  Prior to Admission medications   Medication Sig Start Date End Date Taking? Authorizing Provider  aspirin 81 MG tablet Take 81 mg by mouth daily.   Yes Historical Provider, MD  atorvastatin  (LIPITOR) 40 MG tablet Take 1 tablet (40 mg total) by mouth daily at 6 PM. 01/13/15  Yes Barton Dubois, MD  calcitRIOL (ROCALTROL) 0.5 MCG capsule Take 1 capsule (0.5 mcg total) by mouth daily. 06/23/14  Yes Lucia Gaskins, MD  carvedilol (COREG) 25 MG tablet Take 1 tablet (25 mg total) by mouth 2 (two) times daily with a meal. 01/13/15  Yes Barton Dubois, MD  cloNIDine (CATAPRES) 0.2 MG tablet Take 0.2 mg by mouth 2 (two) times daily as needed (blood pressure >130/27).  12/27/14  Yes Historical Provider, MD  feeding supplement, RESOURCE BREEZE, (RESOURCE BREEZE) LIQD Take 1 Container by mouth 3 (three) times daily between meals. 09/30/14  Yes Annita Brod, MD  hydrALAZINE (APRESOLINE) 25 MG tablet Take 1 tablet (25 mg total) by  mouth 3 (three) times daily. Patient taking differently: Take 25 mg by mouth 3 (three) times daily as needed (High blood pressure).  08/23/14  Yes Rhonda G Barrett, PA-C  isosorbide mononitrate (IMDUR) 30 MG 24 hr tablet Take 0.5 tablets (15 mg total) by mouth at bedtime. 09/20/15  Yes Arnoldo Lenis, MD  lidocaine-prilocaine (EMLA) cream Apply 1 application topically as needed. For dialysis 10/06/15  Yes Historical Provider, MD  lisinopril (PRINIVIL,ZESTRIL) 10 MG tablet Take 1 tablet by mouth daily. 01/24/15  Yes Historical Provider, MD  LUMIGAN 0.01 % SOLN Place 1 drop into both eyes at bedtime. 09/18/15  Yes Historical Provider, MD  multivitamin (RENA-VIT) TABS tablet Take 1 tablet by mouth daily.   Yes Historical Provider, MD  sevelamer carbonate (RENVELA) 800 MG tablet Take 1,600-2,400 mg by mouth See admin instructions. Take 3 tablets (2400 mg) with meals and 2 tablets (1600 mg) with snacks   Yes Historical Provider, MD  SIMBRINZA 1-0.2 % SUSP Place 1 drop into both eyes 2 (two) times daily. 09/18/15  Yes Historical Provider, MD    Family History  Family History  Problem Relation Age of Onset  . Arthritis    . Cancer    . Kidney disease    . Anesthesia problems  Neg Hx   . Hypotension Neg Hx   . Malignant hyperthermia Neg Hx   . Pseudochol deficiency Neg Hx   . Cancer Sister   . Colon cancer Brother   . Colon cancer Brother     Social History  Social History   Social History  . Marital Status: Married    Spouse Name: N/A  . Number of Children: N/A  . Years of Education: 10   Occupational History  .     Social History Main Topics  . Smoking status: Former Smoker -- 1.00 packs/day for 30 years    Types: Cigarettes    Quit date: 08/19/1998  . Smokeless tobacco: Former Systems developer    Types: Chew    Quit date: 12/31/1993  . Alcohol Use: No  . Drug Use: No  . Sexual Activity: No   Other Topics Concern  . Not on file   Social History Narrative     Review of Systems General:  No chills, fever, night sweats or weight changes.  Cardiovascular:  No chest pain, dyspnea on exertion, edema, orthopnea, palpitations, paroxysmal nocturnal dyspnea. Dermatological: No rash, lesions/masses Respiratory: No cough, dyspnea Urologic: No hematuria, dysuria Abdominal:   No nausea, vomiting, diarrhea, bright red blood per rectum, melena, or hematemesis Neurologic:  No visual changes, wkns, changes in mental status. All other systems reviewed and are otherwise negative except as noted above.  Physical Exam  Blood pressure 125/62, pulse 75, temperature 98.1 F (36.7 C), temperature source Oral, resp. rate 18, weight 129 lb 10.1 oz (58.8 kg), SpO2 94 %.  General: Pleasant, NAD Psych: Normal affect. Neuro: Alert and oriented X 3. Moves all extremities spontaneously. HEENT: Normal  Neck: Supple without bruits. JVP 8-9 Lungs:  Resp regular and unlabored, CTA. Heart: RRR, + S4 and AS murmur Abdomen: Soft, non-tender, mildly distended, BS + x 4.  Extremities: s/p right BKA and left AKA, no edema Labs  Troponin (Point of Care Test) No results for input(s): TROPIPOC in the last 72 hours.  Recent Labs  10/21/15 0936 10/21/15 1350 10/21/15 1800  10/21/15 2300  TROPONINI 0.10* 0.24* 0.31* 0.29*   Lab Results  Component Value Date   WBC 5.5 10/22/2015   HGB  8.7* 10/22/2015   HCT 29.1* 10/22/2015   MCV 98.0 10/22/2015   PLT 267 10/22/2015    Recent Labs Lab 10/22/15 0542  NA 134*  K 4.0  CL 101  CO2 24  BUN 14  CREATININE 1.00  CALCIUM 8.4*  GLUCOSE 277*   Lab Results  Component Value Date   CHOL 255* 10/22/2015   HDL 27* 10/22/2015   LDLCALC UNABLE TO CALCULATE IF TRIGLYCERIDE OVER 400 mg/dL 10/22/2015   TRIG 1030* 10/22/2015   Lab Results  Component Value Date   DDIMER 1.95* 08/26/2012     Radiology/Studies  Dg Wrist Complete Right  10/20/2015  CLINICAL DATA:  Acute right wrist pain without known injury. EXAM: RIGHT WRIST - COMPLETE 3+ VIEW COMPARISON:  July 10, 2012. FINDINGS: There is no evidence of fracture or dislocation. Vascular calcifications are noted. Narrowing and sclerosis of the first carpometacarpal joint as well as several intercarpal joints is noted. IMPRESSION: Findings consistent with osteoarthritis involving the first carpometacarpal and several intercarpal joints. No acute abnormality seen in the right wrist. Electronically Signed   By: Marijo Conception, M.D.   On: 10/20/2015 09:39   Dg Chest Port 1 View  10/21/2015  CLINICAL DATA:  Dyspnea and mid chest pain EXAM: PORTABLE CHEST 1 VIEW COMPARISON:  08/04/2015; 03/13/2015; 10/20/2014; 09/22/2014; 03/28/2014; 10/08/2012 FINDINGS: Grossly unchanged enlarged cardiac silhouette and mediastinal contours with atherosclerotic plaque within the thoracic aorta. Grossly unchanged small left-sided effusion with associated left basilar/retrocardiac heterogeneous/consolidative opacities. No new focal airspace opacities. Chronic pulmonary venous congestion without frank evidence of edema. No pneumothorax. Unchanged bones. IMPRESSION: 1. Stable examination without definite superimposed acute cardiopulmonary disease 2. Similar findings of cardiomegaly, pulmonary  venous congestion, chronic left-sided effusion and associated left basilar opacities, likely atelectasis or scar. Electronically Signed   By: Sandi Mariscal M.D.   On: 10/21/2015 09:56   Dg Foot Complete Left  10/02/2015  CLINICAL DATA:  Left foot pain.  Swelling and wound. EXAM: LEFT FOOT - COMPLETE 3+ VIEW COMPARISON:  Radiographs 12/17/2005 FINDINGS: Site of clinical wound not well seen radiographically. No tracking soft tissue air or radiopaque foreign body. There are hammertoe deformity of the digits. No bony destructive change. No fracture or dislocation. Plantar calcaneal spur. Vascular calcifications are seen. Dorsal soft tissue edema. IMPRESSION: No radiographic evidence of osteomyelitis. Dorsal soft tissue edema. Electronically Signed   By: Jeb Levering M.D.   On: 10/02/2015 22:26    ECG  Sinus tach with PACs + LVH  ASSESSMENT AND PLAN  Principal Problem:   Chest pain Active Problems:   Anemia of chronic disease   Elevated troponin   ESRD (end stage renal disease) on dialysis (HCC)   CAD (coronary artery disease)   Chronic combined systolic and diastolic congestive heart failure (HCC)   Benign essential HTN   1. Chest Pain: he has known moderate CAD by cath in 2015 that has been treated medically. Cath showed 40-50% proximal LAD stenosis, 30% mid LAD stenosis at D2, 80% proximal stenosis of both divisions of the ramus however the left circumflex only had luminal irregularities. RCA had up to 40% proximal and mid vessel stenosis. His recent CP occurred in the setting of severe HTN with SBPs in the 200s and he is now CP free with controlled BP. His troponins are mildly abnormal x 3 at 0.24, 0.31 and 0.29. This is also in the setting of ESRD with a SCr of 7.60. Given his recent right BKA and left AKA 10/10/15 + anemia,  he is not a suitable candidate for cath. Maximization of medical therapy may be more appropriate. Continue to keep BP under control.   2. HTN: BP is better controlled,  initially in the 123456 systolic. Most recent BP was 125/62. Continue amlodipine, coreg and lasix. Add back Imdur if can tolerate. Avoid use of clonidine. Can use PRN IV hydralazine to keep SBP <150.   3. PVD: s/p bilateral LE amputations as outlined above.   4. Chronic Combined Systolic + Diastolic CHF: EF in the 99991111 range at time of last assessment 07/2015. He is on medical management with BB. No ACE/ARB given ESRD. He has been on Imdur.   5. AS: mild to moderate per echo 07/2015. AVA 1.12 cm2, mean gradient ws 16 mmHg.   6. ESRD: on HD MWF   Signed, Lyda Jester, PA-C 10/22/2015, 1:43 PM  Patient seen and examined with Lyda Jester, PA-C. We discussed all aspects of the encounter. I agree with the assessment and plan as stated above.  CP likely due to demand ischemia in setting of severe HTN (he states clearly that only gets CP when BP is up) and probable mild fluid overload. Now improved with BP control. Troponin not significantly elevated.  Would use hydralazine prn to keep SBP < 140. Avoid catapres with low EF if at all possible. Agree with Imdur. Plan HD tomorrow.   Bensimhon, Daniel,MD 3:48 PM

## 2015-10-22 NOTE — Progress Notes (Signed)
[facility] PROGRESS NOTE  Randy [name] [id number] DOB: [birth year] DOA: 10/21/2015 PCP: [name], MD  Assessment/Plan: Chest pain in patient with known CAD; mild elevation of troponin (but with ESRD) and heart score of 5 - continue Telemetry monitoring (no ischemic changes appreciated so far; EKG also stable) - troponin mildly elevated; but difficult to assess validity in setting of ESRD - A1c & lipid panel pending  - Continue ASA, Beta blocker and statins - Cardiology consulted (will follow rec's) - CXR without infiltrate, no leukocytosis and no fever. Will monitor off abx's  PVD with recent gangrene of the left foot s/p left AKA on 1/17 by Dr. Scot Dock - Surgical site appears to be healing well - Continue PT/OT - Continue oxycodone for pain control  Hypertension, elevated on admisison - Appears euvolemic - continue current antihypertensive regimen and PRN meds - will monitor and adjust as needed  Chronic systolic and diastolic CHF with EF 99991111 on ECHO from 07/2015 (echo also demonstarted severe hypokinesis of the basal-midinferolateral and inferior myocardium).  - compensated - continue HD for volume management  - follow daily weights and low sodium diet   Moderate AS Mean gradient (S): 16 mm Hg. VTI ratio of LVOT to aortic valve: 0.4. Valve area (VTI): 1.24 cm^2. Valve area (Vmax): 1.21 cm^2. - currently stable -continue outpatient follow up with cardiology service   ESRD with HD MWF,  - Appreciate nephrology assistance - continue HD therapy   Anemia of renal parenchymal disease - Occult stool negative today - Epogen and IV iron as per nephrology discretion  GERD -will use PPI  Code Status: Full Family Communication: no family at bedside  Disposition Plan: to be determine; patient to be seen by cardiology service; will follow rec's   Consultants:  Cardiology   Renal service   Procedures:  Recent left  AKA  Antibiotics:  None   HPI/Subjective: Afebrile, no CP currently, denies SOB.  Objective: Filed Vitals:   10/22/15 0425 10/22/15 0743  BP: 141/68 138/68  Pulse: 76 74  Temp: 99 F (37.2 C)   Resp: 18     Intake/Output Summary (Last 24 hours) at 10/22/15 1055 Last data filed at 10/22/15 0831  Gross per 24 hour  Intake   1200 ml  Output      0 ml  Net   1200 ml   Filed Weights   10/22/15 0425  Weight: 58.8 kg (129 lb 10.1 oz)    Exam:   General:  Afebrile, no CP currently and denies SOB.   Cardiovascular: S1 and S2, no rubs or gallops  Respiratory: no crackles, no wheezing   Abdomen: soft, NT, ND, positive BS  Musculoskeletal: left AKA, trace edema, no cyanosis   Data Reviewed: Basic Metabolic Panel:  Recent Labs Lab 10/16/15 1935 10/18/15 1228 10/19/15 0201 10/20/15 1600 10/22/15 0542  NA 128* 134* 131* 135 134*  K 6.1* 5.2* 5.6* 4.8 4.0  CL 90* 94* 91* 94* 101  CO2 22 26 24 28 24   GLUCOSE 117* 124* 86 162* 277*  BUN 101* 72* 80* 57* 14  CREATININE 11.74* 8.90* 9.78* 7.60* 1.00  CALCIUM 9.0 9.2 8.6* 8.9 8.4*  PHOS 5.8*  --  6.9* 5.8*  --    Liver Function Tests:  Recent Labs Lab 10/16/15 1935 10/19/15 0201 10/20/15 1600  ALBUMIN 2.3* 1.9* 1.9*   CBC:  Recent Labs Lab 10/19/15 0201 10/20/15 0905 10/22/15 0728  WBC 5.9 5.2 5.5  NEUTROABS  --  3.8  --  HGB 8.6* 9.3* 8.7*  HCT 28.8* 30.5* 29.1*  MCV 96.0 97.8 98.0  PLT 218 254 267   Cardiac Enzymes:  Recent Labs Lab 10/21/15 0936 10/21/15 1350 10/21/15 1800 10/21/15 2300  TROPONINI 0.10* 0.24* 0.31* 0.29*   BNP (last 3 results)  Recent Labs  01/08/15 1118 08/04/15 0625 10/21/15 0936  BNP >4500.0* 2452.0* 3090.9*    Studies: Dg Chest Port 1 View  10/21/2015  CLINICAL DATA:  Dyspnea and mid chest pain EXAM: PORTABLE CHEST 1 VIEW COMPARISON:  08/04/2015; 03/13/2015; 10/20/2014; 09/22/2014; 03/28/2014; 10/08/2012 FINDINGS: Grossly unchanged enlarged cardiac  silhouette and mediastinal contours with atherosclerotic plaque within the thoracic aorta. Grossly unchanged small left-sided effusion with associated left basilar/retrocardiac heterogeneous/consolidative opacities. No new focal airspace opacities. Chronic pulmonary venous congestion without frank evidence of edema. No pneumothorax. Unchanged bones. IMPRESSION: 1. Stable examination without definite superimposed acute cardiopulmonary disease 2. Similar findings of cardiomegaly, pulmonary venous congestion, chronic left-sided effusion and associated left basilar opacities, likely atelectasis or scar. Electronically Signed   By: Sandi Mariscal M.D.   On: 10/21/2015 09:56    Scheduled Meds: . amLODipine  10 mg Oral Daily  . aspirin  324 mg Oral NOW   Or  . aspirin  300 mg Rectal NOW  . aspirin  81 mg Oral Daily  . atorvastatin  40 mg Oral q1800  . brinzolamide  1 drop Both Eyes BID   And  . brimonidine  1 drop Both Eyes BID  . calcitRIOL  0.5 mcg Oral Daily  . carvedilol  25 mg Oral BID WC  . feeding supplement  1 Container Oral TID BM  . gabapentin  100 mg Oral QHS  . heparin  5,000 Units Subcutaneous 3 times per day  . latanoprost  1 drop Both Eyes QHS  . lisinopril  10 mg Oral Daily  . multivitamin  1 tablet Oral QHS  . nitroGLYCERIN  0.5 inch Topical 4 times per day  . sevelamer carbonate  1,600 mg Oral With snacks  . sevelamer carbonate  2,400 mg Oral TID WC   Continuous Infusions:   Principal Problem:   Chest pain Active Problems:   Anemia of chronic disease   Elevated troponin   ESRD (end stage renal disease) on dialysis (HCC)   CAD (coronary artery disease)   Chronic combined systolic and diastolic congestive heart failure ([place])   Benign essential HTN    Time spent: 30 minutes    Barton Dubois  [facility] Pager [phone]. If 7PM-7AM, please contact night-coverage at [email or web address], password [email or web address] 10/22/2015, 10:55 AM  LOS: 1 day

## 2015-10-22 NOTE — Evaluation (Signed)
Physical Therapy Evaluation Patient Details Name: Randy [name] MRN: [id number] DOB: 07-10-1932 Today's Date: 10/22/2015   History of Present Illness  Pt is a 82 y/o M w/ recent left AKA 10/09/14 who was readmitted from inpatient rehab for chest pain.  Pt's PMH includes Rt BKA, gout, PVD, stroke, lumbar disc surgery.  Clinical Impression  Pt admitted with above diagnosis. Pt currently with functional limitations due to the deficits listed below (see PT Problem List). See notes below for PLOF in CIR.  Recommending CIR to continue rehab and to reach mod I level of mobility prior to d/c home. Pt will benefit from skilled PT to increase their independence and safety with mobility to allow discharge to the venue listed below.      Follow Up Recommendations CIR (to complete rehab to reach mod I level of mobility)    Equipment Recommendations  None recommended by PT    Recommendations for Other Services Rehab consult;OT consult     Precautions / Restrictions Precautions Precautions: Fall Precaution Comments: recent Lt AKA, h/o Rt BKA Restrictions Weight Bearing Restrictions: Yes LLE Weight Bearing: Non weight bearing      Mobility  Bed Mobility Overal bed mobility: Needs Assistance Bed Mobility: Supine to Sit     Supine to sit: Min guard     General bed mobility comments: No physical assist for supine>sit.  Increased time and definite use of bed rail.  Min guard for pt's safety.  Transfers Overall transfer level: Needs assistance Equipment used: Rolling walker (2 wheeled);None Transfers: Sit to/from Stand;Anterior-Posterior Transfer Sit to Stand: Max Designer, television/film set transfers: Min guard   General transfer comment: Max assist to attempt squat pivot but pt only able to achieve squat and not pivot.  No drop arm chair avaialble.  AP transfer w/ min guard assist and cues for technique.  Ambulation/Gait                Stairs            Wheelchair  Mobility    Modified Rankin (Stroke Patients Only)       Balance Overall balance assessment: Needs assistance Sitting-balance support: Bilateral upper extremity supported Sitting balance-Leahy Scale: Fair     Standing balance support: Bilateral upper extremity supported;During functional activity Standing balance-Leahy Scale: Zero Standing balance comment: max assist for squat                             Pertinent Vitals/Pain Pain Assessment: No/denies pain Pain Intervention(s): Limited activity within patient's tolerance;Repositioned;Monitored during session    Cayce expects to be discharged to:: Inpatient rehab Living Arrangements: Spouse/significant other Available Help at Discharge: Family;Available 24 hours/day (wife) Type of Home: House Home Access: Ramped entrance     Home Layout: One level Home Equipment: Solomon - 2 wheels;Cane - quad;Shower seat;Wheelchair - Press photographer;Bedside commode      Prior Function Level of Independence: Needs assistance   Gait / Transfers Assistance Needed: per chart review pt requires min A w/ even surface transfers (mod A upslope) using sliding board, min A squat pivot to even surface (mod A squat pivot upslope).  per chart review min A w/ sliding board transfer to tub           Hand Dominance   Dominant Hand: Right    Extremity/Trunk Assessment   Upper Extremity Assessment: Defer to OT evaluation  Lower Extremity Assessment: RLE deficits/detail;LLE deficits/detail RLE Deficits / Details: mildly weak proximally, but functional LLE Deficits / Details: grossly 3+/5     Communication   Communication: No difficulties  Cognition Arousal/Alertness: Awake/alert Behavior During Therapy: WFL for tasks assessed/performed Overall Cognitive Status: Within Functional Limits for tasks assessed                      General Comments      Exercises General Exercises  - Lower Extremity Quad Sets: Strengthening;Right;5 reps;Seated Hip ABduction/ADduction: Strengthening;Both;10 reps;Seated Straight Leg Raises: Strengthening;Both;10 reps;Seated Amputee Exercises Hip Extension: Strengthening;Left;10 reps;Seated      Assessment/Plan    PT Assessment Patient needs continued PT services  PT Diagnosis Acute pain;Difficulty walking   PT Problem List Decreased strength;Decreased activity tolerance;Decreased balance;Decreased mobility;Decreased knowledge of use of DME;Decreased safety awareness;Pain  PT Treatment Interventions DME instruction   PT Goals (Current goals can be found in the Care Plan section) Acute Rehab PT Goals Patient Stated Goal: Inpatient Rehab and then back independent at home PT Goal Formulation: With patient Time For Goal Achievement: 11/05/15 Potential to Achieve Goals: Good    Frequency Min 3X/week   Barriers to discharge        Co-evaluation               End of Session   Activity Tolerance: Patient tolerated treatment well;Patient limited by fatigue Patient left: in chair;with call bell/phone within reach;with chair alarm set Nurse Communication: Mobility status;Other (comment) (PA transfer technique to return to bed when ready)    Functional Assessment Tool Used: Clinical Judgement Functional Limitation: Mobility: Walking and moving around Mobility: Walking and Moving Around Current Status [phone]): At least 40 percent but less than 60 percent impaired, limited or restricted Mobility: Walking and Moving Around Goal Status [phone]): At least 20 percent but less than 40 percent impaired, limited or restricted    Time: 1416-1441 PT Time Calculation (min) (ACUTE ONLY): 25 min   Charges:   PT Evaluation $PT Eval Moderate Complexity: 1 Procedure PT Treatments $Therapeutic Exercise: 8-22 mins   PT G Codes:   PT G-Codes **NOT FOR INPATIENT CLASS** Functional Assessment Tool Used: Clinical Judgement Functional  Limitation: Mobility: Walking and moving around Mobility: Walking and Moving Around Current Status [id number]): At least 40 percent but less than 60 percent impaired, limited or restricted Mobility: Walking and Moving Around Goal Status [phone]): At least 20 percent but less than 40 percent impaired, limited or restricted   Joslyn Hy PT, DPT [phone] Pager: [phone] 10/22/2015, 3:58 PM

## 2015-10-23 ENCOUNTER — Inpatient Hospital Stay (HOSPITAL_COMMUNITY): Payer: Medicare Other | Admitting: Physical Therapy

## 2015-10-23 ENCOUNTER — Inpatient Hospital Stay (HOSPITAL_REHABILITATION)
Admission: RE | Admit: 2015-10-23 | Discharge: 2015-10-31 | Disposition: A | Discharge status: Home Health | Payer: Medicare Other | Source: Intra-hospital | Attending: Physical Medicine & Rehabilitation | Admitting: Physical Medicine & Rehabilitation

## 2015-10-23 ENCOUNTER — Inpatient Hospital Stay (HOSPITAL_COMMUNITY): Payer: Medicare Other | Admitting: Occupational Therapy

## 2015-10-23 ENCOUNTER — Inpatient Hospital Stay (HOSPITAL_COMMUNITY): Payer: Medicare Other

## 2015-10-23 DIAGNOSIS — N186 End stage renal disease: Secondary | ICD-10-CM | POA: Diagnosis present

## 2015-10-23 DIAGNOSIS — I132 Hypertensive heart and chronic kidney disease with heart failure and with stage 5 chronic kidney disease, or end stage renal disease: Secondary | ICD-10-CM | POA: Diagnosis present

## 2015-10-23 DIAGNOSIS — F4321 Adjustment disorder with depressed mood: Secondary | ICD-10-CM | POA: Diagnosis present

## 2015-10-23 DIAGNOSIS — D638 Anemia in other chronic diseases classified elsewhere: Secondary | ICD-10-CM | POA: Diagnosis present

## 2015-10-23 DIAGNOSIS — S88111S Complete traumatic amputation at level between knee and ankle, right lower leg, sequela: Secondary | ICD-10-CM

## 2015-10-23 DIAGNOSIS — I2510 Atherosclerotic heart disease of native coronary artery without angina pectoris: Secondary | ICD-10-CM | POA: Diagnosis present

## 2015-10-23 DIAGNOSIS — S88119A Complete traumatic amputation at level between knee and ankle, unspecified lower leg, initial encounter: Secondary | ICD-10-CM | POA: Diagnosis present

## 2015-10-23 DIAGNOSIS — I429 Cardiomyopathy, unspecified: Secondary | ICD-10-CM

## 2015-10-23 DIAGNOSIS — R269 Unspecified abnormalities of gait and mobility: Secondary | ICD-10-CM | POA: Diagnosis present

## 2015-10-23 DIAGNOSIS — Z8673 Personal history of transient ischemic attack (TIA), and cerebral infarction without residual deficits: Secondary | ICD-10-CM | POA: Diagnosis not present

## 2015-10-23 DIAGNOSIS — R7989 Other specified abnormal findings of blood chemistry: Secondary | ICD-10-CM | POA: Diagnosis not present

## 2015-10-23 DIAGNOSIS — G8918 Other acute postprocedural pain: Secondary | ICD-10-CM

## 2015-10-23 DIAGNOSIS — I5043 Acute on chronic combined systolic (congestive) and diastolic (congestive) heart failure: Secondary | ICD-10-CM | POA: Diagnosis not present

## 2015-10-23 DIAGNOSIS — Z4781 Encounter for orthopedic aftercare following surgical amputation: Secondary | ICD-10-CM | POA: Diagnosis present

## 2015-10-23 DIAGNOSIS — I248 Other forms of acute ischemic heart disease: Secondary | ICD-10-CM | POA: Diagnosis present

## 2015-10-23 DIAGNOSIS — Z89611 Acquired absence of right leg above knee: Secondary | ICD-10-CM

## 2015-10-23 DIAGNOSIS — R079 Chest pain, unspecified: Secondary | ICD-10-CM | POA: Diagnosis not present

## 2015-10-23 DIAGNOSIS — E785 Hyperlipidemia, unspecified: Secondary | ICD-10-CM

## 2015-10-23 DIAGNOSIS — I5042 Chronic combined systolic (congestive) and diastolic (congestive) heart failure: Secondary | ICD-10-CM

## 2015-10-23 DIAGNOSIS — Z992 Dependence on renal dialysis: Secondary | ICD-10-CM

## 2015-10-23 DIAGNOSIS — Z89612 Acquired absence of left leg above knee: Secondary | ICD-10-CM | POA: Diagnosis not present

## 2015-10-23 DIAGNOSIS — I209 Angina pectoris, unspecified: Secondary | ICD-10-CM | POA: Diagnosis not present

## 2015-10-23 DIAGNOSIS — I739 Peripheral vascular disease, unspecified: Secondary | ICD-10-CM | POA: Diagnosis present

## 2015-10-23 DIAGNOSIS — I10 Essential (primary) hypertension: Secondary | ICD-10-CM | POA: Diagnosis present

## 2015-10-23 DIAGNOSIS — I25119 Atherosclerotic heart disease of native coronary artery with unspecified angina pectoris: Secondary | ICD-10-CM

## 2015-10-23 DIAGNOSIS — K5903 Drug induced constipation: Secondary | ICD-10-CM | POA: Diagnosis present

## 2015-10-23 DIAGNOSIS — I119 Hypertensive heart disease without heart failure: Secondary | ICD-10-CM | POA: Diagnosis present

## 2015-10-23 DIAGNOSIS — R030 Elevated blood-pressure reading, without diagnosis of hypertension: Secondary | ICD-10-CM

## 2015-10-23 DIAGNOSIS — S78119A Complete traumatic amputation at level between unspecified hip and knee, initial encounter: Secondary | ICD-10-CM

## 2015-10-23 DIAGNOSIS — D500 Iron deficiency anemia secondary to blood loss (chronic): Secondary | ICD-10-CM | POA: Diagnosis present

## 2015-10-23 DIAGNOSIS — Z89511 Acquired absence of right leg below knee: Secondary | ICD-10-CM | POA: Diagnosis not present

## 2015-10-23 LAB — RENAL FUNCTION PANEL
ALBUMIN: 2 g/dL — AB (ref 3.5–5.0)
ANION GAP: 16 — AB (ref 5–15)
BUN: 78 mg/dL — ABNORMAL HIGH (ref 6–20)
CALCIUM: 9.1 mg/dL (ref 8.9–10.3)
CO2: 27 mmol/L (ref 22–32)
CREATININE: 9.78 mg/dL — AB (ref 0.61–1.24)
Chloride: 92 mmol/L — ABNORMAL LOW (ref 101–111)
GFR calc non Af Amer: 4 mL/min — ABNORMAL LOW (ref >60)
GFR, EST AFRICAN AMERICAN: 5 mL/min — AB (ref >60)
Glucose, Bld: 114 mg/dL — ABNORMAL HIGH (ref 65–99)
PHOSPHORUS: 5.8 mg/dL — AB (ref 2.5–4.6)
Potassium: 4.8 mmol/L (ref 3.5–5.1)
SODIUM: 135 mmol/L (ref 135–145)

## 2015-10-23 LAB — CBC
HCT: 28.1 % — ABNORMAL LOW (ref 39.0–52.0)
HEMOGLOBIN: 8.8 g/dL — AB (ref 13.0–17.0)
MCH: 30.1 pg (ref 26.0–34.0)
MCHC: 31.3 g/dL (ref 30.0–36.0)
MCV: 96.2 fL (ref 78.0–100.0)
PLATELETS: 291 10*3/uL (ref 150–400)
RBC: 2.92 MIL/uL — AB (ref 4.22–5.81)
RDW: 18.8 % — ABNORMAL HIGH (ref 11.5–15.5)
WBC: 5.6 10*3/uL (ref 4.0–10.5)

## 2015-10-23 LAB — HEMOGLOBIN A1C
Hgb A1c MFr Bld: 5.4 % (ref 4.8–5.6)
Mean Plasma Glucose: 108 mg/dL

## 2015-10-23 MED ORDER — RENA-VITE PO TABS
1.0000 | ORAL_TABLET | Freq: Every day | ORAL | Status: DC
  Administered 2015-10-23 – 2015-10-30 (×8): 1 via ORAL
  Filled 2015-10-23 (×9): qty 1

## 2015-10-23 MED ORDER — DARBEPOETIN ALFA 200 MCG/0.4ML IJ SOSY: 200.0000 ug | PREFILLED_SYRINGE | INTRAMUSCULAR | Status: DC

## 2015-10-23 MED ORDER — ACETAMINOPHEN 325 MG PO TABS: 325.0000 mg | ORAL_TABLET | ORAL | Status: DC | PRN

## 2015-10-23 MED ORDER — GABAPENTIN 100 MG PO CAPS
100.0000 mg | ORAL_CAPSULE | Freq: Every day | ORAL | Status: DC
Start: 2015-10-23 — End: 2015-10-31
  Administered 2015-10-23 – 2015-10-30 (×8): 100 mg via ORAL
  Filled 2015-10-23 (×9): qty 1

## 2015-10-23 MED ORDER — PROMETHAZINE HCL 12.5 MG PO TABS: 12.5000 mg | ORAL_TABLET | Freq: Four times a day (QID) | ORAL | Status: DC | PRN

## 2015-10-23 MED ORDER — BISACODYL 10 MG RE SUPP
10.0000 mg | Freq: Every day | RECTAL | Status: DC | PRN
  Filled 2015-10-23: qty 1

## 2015-10-23 MED ORDER — ALUMINUM HYDROXIDE GEL 320 MG/5ML PO SUSP
10.0000 mL | Freq: Four times a day (QID) | ORAL | Status: DC | PRN
  Filled 2015-10-23: qty 30

## 2015-10-23 MED ORDER — CALCITRIOL 0.5 MCG PO CAPS
0.5000 ug | ORAL_CAPSULE | Freq: Every day | ORAL | Status: DC
  Administered 2015-10-24 – 2015-10-31 (×7): 0.5 ug via ORAL
  Filled 2015-10-23 (×10): qty 1

## 2015-10-23 MED ORDER — POLYVINYL ALCOHOL 1.4 % OP SOLN
1.0000 [drp] | OPHTHALMIC | Status: DC | PRN
  Filled 2015-10-23: qty 15

## 2015-10-23 MED ORDER — GUAIFENESIN-DM 100-10 MG/5ML PO SYRP: 5.0000 mL | ORAL_SOLUTION | Freq: Four times a day (QID) | ORAL | Status: DC | PRN

## 2015-10-23 MED ORDER — ACETAMINOPHEN 325 MG PO TABS
650.0000 mg | ORAL_TABLET | ORAL | Status: DC | PRN
  Administered 2015-10-26: 650 mg via ORAL
  Filled 2015-10-23 (×2): qty 2

## 2015-10-23 MED ORDER — BRINZOLAMIDE 1 % OP SUSP
1.0000 [drp] | Freq: Two times a day (BID) | OPHTHALMIC | Status: DC
  Administered 2015-10-23 – 2015-10-31 (×16): 1 [drp] via OPHTHALMIC
  Filled 2015-10-23: qty 10

## 2015-10-23 MED ORDER — OXYCODONE HCL 5 MG PO TABS
ORAL_TABLET | ORAL | Status: AC
  Administered 2015-10-23: 10 mg via ORAL
  Filled 2015-10-23: qty 2

## 2015-10-23 MED ORDER — FLEET ENEMA 7-19 GM/118ML RE ENEM
1.0000 | ENEMA | Freq: Once | RECTAL | Status: AC | PRN
  Administered 2015-10-24: 1 via RECTAL
  Filled 2015-10-23: qty 1

## 2015-10-23 MED ORDER — ENOXAPARIN SODIUM 30 MG/0.3ML ~~LOC~~ SOLN
30.0000 mg | SUBCUTANEOUS | Status: DC
  Administered 2015-10-23 – 2015-10-29 (×7): 30 mg via SUBCUTANEOUS
  Filled 2015-10-23 (×7): qty 0.3

## 2015-10-23 MED ORDER — HYDRALAZINE HCL 20 MG/ML IJ SOLN: 10.0000 mg | Freq: Four times a day (QID) | INTRAMUSCULAR | Status: DC | PRN

## 2015-10-23 MED ORDER — SORBITOL 70 % SOLN
30.0000 mL | Freq: Two times a day (BID) | Status: DC | PRN
  Administered 2015-10-25: 30 mL via ORAL
  Filled 2015-10-23: qty 30

## 2015-10-23 MED ORDER — NAPHAZOLINE-PHENIRAMINE 0.025-0.3 % OP SOLN
1.0000 [drp] | Freq: Two times a day (BID) | OPHTHALMIC | Status: DC
  Administered 2015-10-25: 2 [drp] via OPHTHALMIC
  Administered 2015-10-26 – 2015-10-27 (×3): 1 [drp] via OPHTHALMIC
  Administered 2015-10-27: 2 [drp] via OPHTHALMIC
  Administered 2015-10-28 – 2015-10-29 (×4): 1 [drp] via OPHTHALMIC
  Administered 2015-10-30: 2 [drp] via OPHTHALMIC
  Administered 2015-10-30: 1 [drp] via OPHTHALMIC
  Filled 2015-10-23 (×31): qty 5

## 2015-10-23 MED ORDER — LATANOPROST 0.005 % OP SOLN
1.0000 [drp] | Freq: Every day | OPHTHALMIC | Status: DC
  Administered 2015-10-23 – 2015-10-30 (×8): 1 [drp] via OPHTHALMIC
  Filled 2015-10-23: qty 2.5

## 2015-10-23 MED ORDER — PANTOPRAZOLE SODIUM 40 MG PO TBEC: 40.0000 mg | DELAYED_RELEASE_TABLET | Freq: Every day | ORAL | Status: AC

## 2015-10-23 MED ORDER — BRIMONIDINE TARTRATE 0.2 % OP SOLN
1.0000 [drp] | Freq: Two times a day (BID) | OPHTHALMIC | Status: DC
  Administered 2015-10-23 – 2015-10-31 (×16): 1 [drp] via OPHTHALMIC
  Filled 2015-10-23: qty 5

## 2015-10-23 MED ORDER — METHOCARBAMOL 500 MG PO TABS
500.0000 mg | ORAL_TABLET | Freq: Four times a day (QID) | ORAL | Status: DC | PRN
  Administered 2015-10-25 – 2015-10-28 (×3): 500 mg via ORAL
  Filled 2015-10-23 (×3): qty 1

## 2015-10-23 MED ORDER — PANTOPRAZOLE SODIUM 40 MG PO TBEC
40.0000 mg | DELAYED_RELEASE_TABLET | Freq: Every day | ORAL | Status: DC
  Administered 2015-10-24 – 2015-10-30 (×7): 40 mg via ORAL
  Filled 2015-10-23 (×7): qty 1

## 2015-10-23 MED ORDER — CARVEDILOL 25 MG PO TABS
25.0000 mg | ORAL_TABLET | Freq: Two times a day (BID) | ORAL | Status: DC
  Administered 2015-10-24 – 2015-10-31 (×13): 25 mg via ORAL
  Filled 2015-10-23 (×14): qty 1

## 2015-10-23 MED ORDER — ASPIRIN 81 MG PO CHEW
81.0000 mg | CHEWABLE_TABLET | Freq: Every day | ORAL | Status: DC
  Administered 2015-10-24 – 2015-10-31 (×7): 81 mg via ORAL
  Filled 2015-10-23 (×9): qty 1

## 2015-10-23 MED ORDER — PROMETHAZINE HCL 25 MG/ML IJ SOLN
12.5000 mg | Freq: Four times a day (QID) | INTRAMUSCULAR | Status: DC | PRN
  Filled 2015-10-23: qty 1

## 2015-10-23 MED ORDER — ISOSORBIDE MONONITRATE ER 30 MG PO TB24
30.0000 mg | ORAL_TABLET | Freq: Every day | ORAL | Status: DC
  Administered 2015-10-24 – 2015-10-31 (×7): 30 mg via ORAL
  Filled 2015-10-23 (×8): qty 1

## 2015-10-23 MED ORDER — SEVELAMER CARBONATE 800 MG PO TABS
1600.0000 mg | ORAL_TABLET | ORAL | Status: DC
  Administered 2015-10-24 – 2015-10-29 (×6): 1600 mg via ORAL
  Filled 2015-10-23 (×6): qty 2

## 2015-10-23 MED ORDER — GABAPENTIN 100 MG PO CAPS: 100.0000 mg | ORAL_CAPSULE | Freq: Every day | ORAL | Status: DC

## 2015-10-23 MED ORDER — DIPHENHYDRAMINE HCL 12.5 MG/5ML PO ELIX: 12.5000 mg | ORAL_SOLUTION | Freq: Four times a day (QID) | ORAL | Status: DC | PRN

## 2015-10-23 MED ORDER — LISINOPRIL 10 MG PO TABS
10.0000 mg | ORAL_TABLET | Freq: Every day | ORAL | Status: DC
  Administered 2015-10-24 – 2015-10-31 (×7): 10 mg via ORAL
  Filled 2015-10-23 (×11): qty 1

## 2015-10-23 MED ORDER — OXYCODONE HCL 5 MG PO TABS
10.0000 mg | ORAL_TABLET | ORAL | Status: DC | PRN
  Administered 2015-10-23 – 2015-10-25 (×8): 10 mg via ORAL
  Filled 2015-10-23 (×8): qty 2

## 2015-10-23 MED ORDER — NAPHAZOLINE-GLYCERIN 0.012-0.2 % OP SOLN
1.0000 [drp] | Freq: Two times a day (BID) | OPHTHALMIC | Status: DC
  Filled 2015-10-23: qty 15

## 2015-10-23 MED ORDER — PROMETHAZINE HCL 12.5 MG RE SUPP
12.5000 mg | Freq: Four times a day (QID) | RECTAL | Status: DC | PRN
  Filled 2015-10-23: qty 1

## 2015-10-23 MED ORDER — TRAZODONE HCL 50 MG PO TABS: 25.0000 mg | ORAL_TABLET | Freq: Every evening | ORAL | Status: DC | PRN

## 2015-10-23 MED ORDER — AMLODIPINE BESYLATE 10 MG PO TABS: 10.0000 mg | ORAL_TABLET | Freq: Every day | ORAL | Status: DC

## 2015-10-23 MED ORDER — SEVELAMER CARBONATE 800 MG PO TABS
2400.0000 mg | ORAL_TABLET | Freq: Three times a day (TID) | ORAL | Status: DC
  Administered 2015-10-24 – 2015-10-31 (×16): 2400 mg via ORAL
  Filled 2015-10-23 (×20): qty 3

## 2015-10-23 MED ORDER — AMLODIPINE BESYLATE 10 MG PO TABS
10.0000 mg | ORAL_TABLET | Freq: Every day | ORAL | Status: DC
  Administered 2015-10-24 – 2015-10-25 (×2): 10 mg via ORAL
  Filled 2015-10-23 (×3): qty 1

## 2015-10-23 MED ORDER — BOOST / RESOURCE BREEZE PO LIQD
1.0000 | Freq: Three times a day (TID) | ORAL | Status: DC
  Administered 2015-10-24 – 2015-10-29 (×13): 1 via ORAL

## 2015-10-23 MED ORDER — ISOSORBIDE MONONITRATE ER 30 MG PO TB24
30.0000 mg | ORAL_TABLET | Freq: Every day | ORAL | Status: DC
  Administered 2015-10-23: 30 mg via ORAL
  Filled 2015-10-23: qty 1

## 2015-10-23 MED ORDER — DARBEPOETIN ALFA 200 MCG/0.4ML IJ SOSY
200.0000 ug | PREFILLED_SYRINGE | INTRAMUSCULAR | Status: DC
  Administered 2015-10-25: 200 ug via INTRAVENOUS
  Filled 2015-10-23: qty 0.4

## 2015-10-23 MED ORDER — ATORVASTATIN CALCIUM 40 MG PO TABS
40.0000 mg | ORAL_TABLET | Freq: Every day | ORAL | Status: DC
Start: 2015-10-24 — End: 2015-10-31
  Administered 2015-10-24 – 2015-10-29 (×6): 40 mg via ORAL
  Filled 2015-10-23 (×6): qty 1

## 2015-10-23 NOTE — Progress Notes (Signed)
Inpatient Rehabilitation  Received insurance authorization to admit Randy [name] to IP Rehab today.  Notified patient's family, RN CM, CSW and MD.   Gunnar Fusi, M.A., CCC/SLP Admission Coordinator  [facility]  Cell [phone]

## 2015-10-23 NOTE — H&P (View-Only) (Signed)
Physical Medicine and Rehabilitation Admission H&P     CC: L-AKA due to PVD/ History of R-BKA   HPI:   Randy [name] is a 82 y.o. 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 Patient elected to undergo L-AKA on 10/10/15 by Dr. Donnetta Hutching. Post op has had bleeding from stump requiring dressing changes.  Pt had chest pain and was worked up for MI  nd CIR consulted in anticipation of extensive rehab needs.   Review of Systems  Constitutional: Negative.   HENT: Negative.   Eyes: Negative.   Respiratory: Negative.   Genitourinary:        Decreased output related to ESRD  Musculoskeletal:       Mild left stump pain  Skin: Negative.   Neurological: Negative.   Endo/Heme/Allergies: Negative.   Psychiatric/Behavioral: Negative.    All other systems reviewed and negative.     Past Medical History   Diagnosis  Date   .  Hypercholesterolemia     .  Gout     .  Nonischemic cardiomyopathy (HCC)         LVEF 35-40%   .  Anemia of chronic disease     .  Essential hypertension     .  Arthritis     .  Peripheral vascular disease ([place])         Status post right below knee amputation for a nonhealing wound of the right foot 12/2014   .  History of pneumonia  2014   .  CAD (coronary artery disease)         Moderate multivessel disease 07/2015 - managed medically   .  History of myopericarditis  2015       07/2015   .  Stroke [facility])         "they said I did"  "I did not know anything about it"   .  ESRD on hemodialysis [facility])         M/W/F in [place] - Dr. Florene Glen   .  Constipation     .  History of blood transfusion     .  Pneumonia     .  Acute myopericarditis  2015       Past Surgical History   Procedure  Laterality  Date   .  Knee arthroscopy  Right  2007   .  Back surgery       .  Hemorrhoid surgery    1970's   .  Ganglion cyst excision     01/03/2012       Procedure: REMOVAL GANGLION OF WRIST;  Surgeon: Scherry Ran, MD;  Location: [facility];  Service: General;  Laterality: Right;   .  Colonoscopy       .  Av fistula placement    08/24/2012       Procedure: ARTERIOVENOUS (AV) FISTULA CREATION;  Surgeon: Rosetta Posner, MD;  Location: [place];  Service: Vascular;  Laterality: Left;   .  Insertion of dialysis catheter  Right          neck   .  Insertion of dialysis catheter    10/19/2012       Procedure: INSERTION OF DIALYSIS CATHETER;  Surgeon: Rosetta Posner, MD;  Location: [place];  Service: Vascular;  Laterality: N/A;  REMOVE TEMPORARY CATH   .  Cholecystectomy       .  Lumbar disc surgery    2004   .  Left heart catheterization with coronary angiogram  N/A  08/17/2014       Procedure: LEFT HEART CATHETERIZATION WITH CORONARY ANGIOGRAM;  Surgeon: Laurey Morale, MD;  Location: [facility] C[facility] LAB;  Service: Cardiovascular;  Laterality: N/A;   .  Colonoscopy  Left  09/26/2014       Procedure: COLONOSCOPY;  Surgeon: Willis Modena, MD;  Location: [facility] ENDOSCOPY;  Service: Endoscopy;  Laterality: Left;   .  Amputation  Right  01/05/2015       Procedure: AMPUTATION BELOW KNEE;  Surgeon: Chuck Hint, MD;  Location: [facility] OR;  Service: Vascular;  Laterality: Right;   .  Capd removal  N/A  03/13/2015       Procedure: CONTINUOUS AMBULATORY PERITONEAL DIALYSIS  (CAPD) CATHETER REMOVAL;  Surgeon: Abigail Miyamoto, MD;  Location: [facility] OR;  Service: General;  Laterality: N/A;   .  Cardiac catheterization       .  Above knee leg amputation  Left  10/10/2015   .  Amputation  Left  10/10/2015       Procedure: AMPUTATION ABOVE KNEE LEFT;  Surgeon: Chuck Hint, MD;  Location: [facility] OR;  Service: Vascular;  Laterality: Left;       Family History   Problem  Relation  Age of Onset   .  Arthritis       .  Cancer       .  Kidney disease       .  Anesthesia problems  Neg Hx     .  Hypotension  Neg Hx     .  Malignant hyperthermia  Neg Hx     .   Pseudochol deficiency  Neg Hx     .  Cancer  Sister     .  Colon cancer  Brother     .  Colon cancer  Brother       Social History:  Married. 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 Wife at home can provide supervsion after discharge. Niece provides transportation to HD. He reports that he quit smoking about 17 years ago. His smoking use included Cigarettes. He has a 30 pack-year smoking history. He quit smokeless tobacco use about 21 years ago. His smokeless tobacco use included Chew. He reports that he does not drink alcohol or use illicit drugs.    Allergies: No Known Allergies      Medications Prior to Admission   Medication  Sig  Dispense  Refill   .  amLODipine (NORVASC) 10 MG tablet  Take 1 tablet (10 mg total) by mouth daily.  30 tablet  3   .  aspirin 81 MG tablet  Take 81 mg by mouth daily.       Marland Kitchen  atorvastatin (LIPITOR) 40 MG tablet  Take 1 tablet (40 mg total) by mouth daily at 6 PM.  30 tablet  1   .  calcitRIOL (ROCALTROL) 0.5 MCG capsule  Take 1 capsule (0.5 mcg total) by mouth daily.  30 capsule  3   .  carvedilol (COREG) 25 MG tablet  Take 1 tablet (25 mg total) by mouth 2 (two) times daily with a meal.  60 tablet  1   .  cloNIDine (CATAPRES) 0.2 MG tablet  Take 0.2 mg by mouth 2 (two) times daily as needed (blood pressure >130/27).     10   .  doxycycline (VIBRA-TABS) 100 MG tablet  Take 1 tablet (100 mg total) by mouth every 12 (twelve) hours.  14 tablet  0   .  feeding supplement, RESOURCE BREEZE, (RESOURCE BREEZE) LIQD  Take 1 Container by mouth 3 (three) times daily between meals.  90 Container  5   .  gabapentin (NEURONTIN) 100 MG capsule  Take 1 capsule (100 mg total) by mouth 3 (three) times daily. (Patient taking differently: Take 100 mg by mouth at bedtime. )  60 capsule  0   .  hydrALAZINE (APRESOLINE) 25 MG tablet  Take 1 tablet (25 mg total) by mouth 3 (three) times daily. (Patient taking differently: Take 25 mg by mouth 3 (three) times daily as needed  (High blood pressure). )  90 tablet  3   .  isosorbide mononitrate (IMDUR) 30 MG 24 hr tablet  Take 0.5 tablets (15 mg total) by mouth at bedtime.  45 tablet  3   .  lisinopril (PRINIVIL,ZESTRIL) 10 MG tablet  Take 1 tablet by mouth daily.    2   .  LUMIGAN 0.01 % SOLN  Place 1 drop into both eyes at bedtime.    11   .  multivitamin (RENA-VIT) TABS tablet  Take 1 tablet by mouth daily.       .  Oxycodone HCl 10 MG TABS  Take 10 mg by mouth every 6 (six) hours as needed (for pain).     0   .  oxyCODONE-acetaminophen (PERCOCET/ROXICET) 5-325 MG per tablet  Take 1 tablet by mouth every 6 (six) hours as needed for severe pain.  20 tablet  0   .  sevelamer carbonate (RENVELA) 800 MG tablet  Take 1,600-2,400 mg by mouth See admin instructions. Take 3 tablets (2400 mg) with meals and 2 tablets (1600 mg) with snacks       .  SIMBRINZA 1-0.2 % SUSP  Place 1 drop into both eyes 2 (two) times daily.    11   .  predniSONE (DELTASONE) 20 MG tablet  Take 2 tablets by mouth daily X 2 days; then 1 tablet by mouth daily X 2 days; then 1/2 tablet by mouth daily X 3 days and stop prednisone  9 tablet  0     Home: Home Living Family/patient expects to be discharged to:: Private residence Living Arrangements: Spouse/significant other Available Help at Discharge: Family, Available 24 hours/day Type of Home: House Home Access: Stairs to enter, Ramped entrance Technical brewer of Steps: 2 Entrance Stairs-Rails: None Home Layout: One level Bathroom Shower/Tub: Chiropodist: Handicapped height Bathroom Accessibility: Yes Home Equipment: Environmental consultant - 2 wheels, [employer] - quad, Civil engineer, contracting, Wheelchair - manual, Physiological scientist History: Prior Function Level of Independence: Needs assistance Gait / Transfers Assistance Needed: pt uses w/c as primary means of mobility, transferred independently to bed/toilet/chair, assisted for tub transfers ADL's / Homemaking Assistance Needed: Wife  assists pt to get down into jetted tub and washes pt's back, wife performs all housekeeping and meal prep Comments: Pt does not like to shower.  Functional Status:  Mobility: Bed Mobility Overal bed mobility: Needs Assistance Bed Mobility: Supine to Sit, Sit to Supine, Rolling Rolling: Min guard Supine to sit: Mod assist Sit to supine: Min guard General bed mobility comments: also extra time to move.  pt managed to maneuver himself around in supine, but had more difficulty using arms to scoot in sitting around the bed. Transfers General transfer comment: not observed      ADL:  ADL Overall ADL's : Needs assistance/impaired Eating/Feeding: Independent, Bed level Grooming: Wash/dry hands, Wash/dry face, Minimal assistance, Sitting Upper Body Bathing: Moderate assistance, Sitting Lower Body Bathing: Maximal assistance, Sitting/lateral leans Upper Body Dressing : Minimal assistance, Sitting Lower Body Dressing: Maximal assistance, Sitting/lateral leans General ADL Comments: Pt seen following PT evaluation and having had HD earlier today, fatigued  Cognition: Cognition Overall Cognitive Status: Within Functional Limits for tasks assessed Orientation Level: Oriented X4 Cognition Arousal/Alertness: Awake/alert Behavior During Therapy: WFL for tasks assessed/performed Overall Cognitive Status: Within Functional Limits for tasks assessed    Blood pressure 153/76, pulse 83, temperature 98.1 F (36.7 C), temperature source Oral, resp. rate 17, height '5\' 9"'$  (1.753 m), weight 59.9 kg (132 lb 0.9 oz), SpO2 98 %. Physical Exam  Nursing note and vitals reviewed. Constitutional: He is oriented to person, place, and time. He appears well-developed and well-nourished.  HENT:   Head: Normocephalic and atraumatic.  Right Ear: External ear normal.  Left Ear: External ear normal.   Mouth/Throat: Oropharynx is clear and moist.  Eyes: Conjunctivae and EOM are normal. Pupils are equal, round,  and reactive to light.  Neck: Normal range of motion. Neck supple. No JVD present.  Cardiovascular: Normal rate and regular rhythm.    Murmur heard. Respiratory: Effort normal and breath sounds normal. No stridor. No respiratory distress. He has no wheezes. He has no rales.  GI: Soft. Bowel sounds are normal. He exhibits no distension. There is no tenderness. There is no rebound.  Genitourinary: Penis normal.  Musculoskeletal:  Right BKA well healed no edema , non tender Left AKA with Ace wrap, no seepage through bandage, mild edema, moderate tenderness to palpation  Neurological: He is alert and oriented to person, place, and time.  Skin: Skin is warm.  Psychiatric: He has a normal mood and affect.  5/5 in BUE and R Hip flexor, LLE has trace hip flexion     Lab Results Last 48 Hours    Results for orders placed or performed during the hospital encounter of 10/10/15 (from the past 48 hour(s))   Basic metabolic panel     Status: Abnormal     Collection Time: 10/11/15  3:29 AM   Result  Value  Ref Range     Sodium  138  135 - 145 mmol/L     Potassium  4.9  3.5 - 5.1 mmol/L     Chloride  97 (L)  101 - 111 mmol/L     CO2  26  22 - 32 mmol/L     Glucose, Bld  79  65 - 99 mg/dL     BUN  53 (H)  6 - 20 mg/dL     Creatinine, Ser  9.88 (H)  0.61 - 1.24 mg/dL     Calcium  8.5 (L)  8.9 - 10.3 mg/dL     GFR calc non Af Amer  4 (L)  >60 mL/min     GFR calc Af Amer  5 (L)  >60 mL/min       Comment:  (NOTE)  The eGFR has been calculated using the CKD EPI equation. This calculation has not been validated in all clinical situations. eGFR's persistently <60 mL/min signify possible Chronic Kidney Disease.      Anion gap  15  5 - 15   CBC     Status: Abnormal     Collection Time: 10/11/15  3:29 AM   Result  Value  Ref Range     WBC  8.2  4.0 - 10.5 K/uL     RBC  3.60 (L)  4.22 - 5.81 MIL/uL     Hemoglobin  10.7 (L)  13.0 - 17.0 g/dL     HCT  35.5 (L)  39.0 - 52.0 %     MCV  98.6  78.0 - 100.0  fL     MCH  29.7  26.0 - 34.0 pg     MCHC  30.1  30.0 - 36.0 g/dL     RDW  18.6 (H)  11.5 - 15.5 %     Platelets  242  150 - 400 K/uL   Prealbumin     Status: Abnormal     Collection Time: 10/11/15  8:30 AM   Result  Value  Ref Range     Prealbumin  17.4 (L)  18 - 38 mg/dL   Phosphorus     Status: None     Collection Time: 10/11/15  8:30 AM   Result  Value  Ref Range     Phosphorus  3.6  2.5 - 4.6 mg/dL   Renal function panel     Status: Abnormal     Collection Time: 10/11/15  1:30 PM   Result  Value  Ref Range     Sodium  137  135 - 145 mmol/L     Potassium  3.7  3.5 - 5.1 mmol/L     Chloride  97 (L)  101 - 111 mmol/L     CO2  29  22 - 32 mmol/L     Glucose, Bld  130 (H)  65 - 99 mg/dL     BUN  17  6 - 20 mg/dL     Creatinine, Ser  5.07 (H)  0.61 - 1.24 mg/dL       Comment:  DELTA CHECK NOTED     Calcium  9.0  8.9 - 10.3 mg/dL     Phosphorus  4.2  2.5 - 4.6 mg/dL     Albumin  2.5 (L)  3.5 - 5.0 g/dL     GFR calc non Af Amer  10 (L)  >60 mL/min     GFR calc Af Amer  11 (L)  >60 mL/min       Comment:  (NOTE)  The eGFR has been calculated using the CKD EPI equation. This calculation has not been validated in all clinical situations. eGFR's persistently <60 mL/min signify possible Chronic Kidney Disease.      Anion gap  11  5 - 15   CBC     Status: Abnormal     Collection Time: 10/11/15  1:30 PM   Result  Value  Ref Range     WBC  7.8  4.0 - 10.5 K/uL     RBC  4.00 (L)  4.22 - 5.81 MIL/uL     Hemoglobin  11.7 (L)  13.0 - 17.0 g/dL     HCT  39.2  39.0 - 52.0 %     MCV  98.0  78.0 - 100.0 fL     MCH  29.3  26.0 - 34.0 pg     MCHC  29.8 (L)  30.0 - 36.0 g/dL     RDW  18.7 (H)  11.5 - 15.5 %     Platelets  219  150 - 400 K/uL   Glucose, capillary     Status: Abnormal     Collection Time: 10/11/15  4:11 PM   Result  Value  Ref Range  Glucose-Capillary  183 (H)  65 - 99 mg/dL   Glucose, capillary     Status: Abnormal     Collection Time: 10/11/15  8:57 PM   Result   Value  Ref Range     Glucose-Capillary  105 (H)  65 - 99 mg/dL     Comment 1  Notify RN       Comment 2  Document in Chart     Basic metabolic panel     Status: Abnormal     Collection Time: 10/12/15  2:50 AM   Result  Value  Ref Range     Sodium  132 (L)  135 - 145 mmol/L     Potassium  4.7  3.5 - 5.1 mmol/L       Comment:  DELTA CHECK NOTED     Chloride  93 (L)  101 - 111 mmol/L     CO2  27  22 - 32 mmol/L     Glucose, Bld  116 (H)  65 - 99 mg/dL     BUN  29 (H)  6 - 20 mg/dL     Creatinine, Ser  6.75 (H)  0.61 - 1.24 mg/dL     Calcium  9.0  8.9 - 10.3 mg/dL     GFR calc non Af Amer  7 (L)  >60 mL/min     GFR calc Af Amer  8 (L)  >60 mL/min       Comment:  (NOTE)  The eGFR has been calculated using the CKD EPI equation. This calculation has not been validated in all clinical situations. eGFR's persistently <60 mL/min signify possible Chronic Kidney Disease.      Anion gap  12  5 - 15   CBC     Status: Abnormal     Collection Time: 10/12/15  2:50 AM   Result  Value  Ref Range     WBC  6.7  4.0 - 10.5 K/uL     RBC  3.52 (L)  4.22 - 5.81 MIL/uL     Hemoglobin  10.2 (L)  13.0 - 17.0 g/dL     HCT  34.3 (L)  39.0 - 52.0 %     MCV  97.4  78.0 - 100.0 fL     MCH  29.0  26.0 - 34.0 pg     MCHC  29.7 (L)  30.0 - 36.0 g/dL     RDW  18.5 (H)  11.5 - 15.5 %     Platelets  192  150 - 400 K/uL   Glucose, capillary     Status: Abnormal     Collection Time: 10/12/15  6:10 AM   Result  Value  Ref Range     Glucose-Capillary  102 (H)  65 - 99 mg/dL     Comment 1  Notify RN       Comment 2  Document in Chart         Imaging Results (Last 48 hours)    No results found.       Medical Problem List and Plan: 1.  Decreased mobility and self care    secondary to Left AKA POD #2 with prior hx of R BKA 2.  DVT Prophylaxis/Anticoagulation: Pharmaceutical: Lovenox 3. Pain Management: Oxycodone IR 4. Mood: monitor for reactive depression 5. Neuropsych: This patient is capable of making  decisions on his own behalf. 6. Skin/Wound Care: Dressing changes and ACE wrap to LLE 7. Fluids/Electrolytes/Nutrition: Monitor I/Os, meal intake, may  need dietary consult 8.  MEB:RAXENMM, lisinopril, coreg, with prn hydralazine 9. ESRD:  HD managed by Nephro M-W F 10 Anemia of chronic disease:  CBC in am, Aranesp weekly in HD 11. NISCM/CAD:Lipitor , ASA, Coreg     Post Admission Physician Evaluation: 1. Functional deficits secondary  to secondary to Left AKA 10/10/15 with prior hx of R BKA. 2. Patient is admitted to receive collaborative, interdisciplinary care between the physiatrist, rehab nursing staff, and therapy team. 3. Patient's level of medical complexity and substantial therapy needs in context of that medical necessity cannot be provided at a lesser intensity of care such as a SNF. 4. Patient has experienced substantial functional loss from his/her baseline which was documented above under the "Functional History" and "Functional Status" headings.  Judging by the patient's diagnosis, physical exam, and functional history, the patient has potential for functional progress which will result in measurable gains while on inpatient rehab.  These gains will be of substantial and practical use upon discharge  in facilitating mobility and self-care at the household level. 5. Physiatrist will provide 24 hour management of medical needs as well as oversight of the therapy plan/treatment and provide guidance as appropriate regarding the interaction of the two. 6. 24 hour rehab nursing will assist with bowel management, safety, skin/wound care, disease management, medication administration, pain management and patient education  and help integrate therapy concepts, techniques,education, etc. 7. PT will assess and treat for/with: pre gait, gait training, endurance , safety, equipment, neuromuscular re education.   Goals are: Sup. 8. OT will assess and treat for/with: ADLs, Cognitive perceptual  skills, Neuromuscular re education, safety, endurance, equipment.   Goals are: Sup. Therapy may not proceed with showering this patient until POD 4, then with LLE covered. 9. SLP will assess and treat for/with: NA.  Goals are: NA. 10. Case Management and Social Worker will assess and treat for psychological issues and discharge planning. 11. Team conference will be held weekly to assess progress toward goals and to determine barriers to discharge. 12. Patient will receive at least 3 hours of therapy per day at least 5 days per week. 13. ELOS: 7-11 Days        14. Prognosis: good.    1/19/2017oxycodone IR, aranesp weekly in HD               10/23/15-- Addendum to H&P on 1/19: Randy [name] is currently POD# 13 after L-AKA. This was an interrupted stay due to transfer to acute hospital on 10/20/14 due to chest pain. He was evaluated by Dr. [name] and cardiac enzymes were drawn for work up. This showed some elevation due to demand ischemia in setting of sever HTN. He recommended using hydralazine prn and to avoid catapres.  To continue Imdur and no further work up indicated. He was cleared to return to CIR to complete his rehab program today.

## 2015-10-23 NOTE — Care Management Obs Status (Signed)
[place] NOTIFICATION   Patient Details  Name: Randy [name] MRN: [id number] Date of Birth: [birth year]   Medicare Observation Status Notification Given:  Yes    Bethena Roys, RN 10/23/2015, 11:42 AM

## 2015-10-23 NOTE — Progress Notes (Signed)
   VASCULAR SURGERY ASSESSMENT & PLAN:  * POD 13 s/p L AKA  * L AKA is healing nicely. I will arrange staple removal in 2 weeks.   SUBJECTIVE: No complaints.  PHYSICAL EXAM: Filed Vitals:   10/22/15 1934 10/22/15 2324 10/23/15 0425 10/23/15 0545  BP: 138/59 136/60 128/61 124/55  Pulse: 72 71 75 72  Temp: 98.1 F (36.7 C) 98.8 F (37.1 C) 98.2 F (36.8 C)   TempSrc: Oral Oral Oral   Resp: 18 18 20 18   Weight:   132 lb 4.4 oz (60 kg)   SpO2: 97% 98% 94% 92%   L AKA inspected and looks fine.   LABS: Lab Results  Component Value Date   WBC 5.5 10/22/2015   HGB 8.7* 10/22/2015   HCT 29.1* 10/22/2015   MCV 98.0 10/22/2015   PLT 267 10/22/2015   Lab Results  Component Value Date   CREATININE 1.00 10/22/2015    Principal Problem:   Chest pain Active Problems:   Anemia of chronic disease   Elevated troponin   ESRD (end stage renal disease) on dialysis [facility])   CAD (coronary artery disease)   Chronic combined systolic and diastolic congestive heart failure (HCC)   Benign essential HTN   [name] Beeper: [id number] 10/23/2015

## 2015-10-23 NOTE — Progress Notes (Signed)
[name] Admission Coordinator Signed Physical Medicine and Rehabilitation PMR Pre-admission 10/23/2015 1:47 PM  Related encounter: Admission (Current) from 10/21/2015 in [facility]   PMR Admission Coordinator Pre-Admission Assessment  Patient: Randy [name] is an 82 y.o., male MRN: [id number] DOB: [birth year] Height:  5\' 9"  (175.3 cm) Weight: 63 kg (138 lb 14.2 oz)  Insurance Information HMO: yes PPO: PCP: IPA: 80/20: OTHER:  PRIMARY: BCBS Medicare Policy#: [id number] Subscriber: self CM Name: Dallas Breeding Phone#: [phone] Fax#: [phone] Pre-Cert#: Employer: retired Benefits: Phone #: [phone] Name: Driscilla Moats. Date: 09/24/15 Deduct: $0 Out of Pocket Max: $4900 Life Max: none CIR: $275 per day, days 1-6; $0 days 7 and beyond SNF: $0, days 1-20; $164.50 days 21-100 Outpatient: $40 copay  Home Health: 100%  DME: 80% Co-Pay: 20% Providers: in network  SECONDARY: Policy#: Subscriber:  CM Name: Phone#: Fax#:  Pre-Cert#: Employer:  Benefits: Phone #: Name:  Eff. Date: Deduct: Out of Pocket Max: Life Max:  CIR: SNF:  Outpatient: Co-Pay:  Home Health: Co-Pay:  DME: Co-Pay:   Medicaid Application Date: Case Manager:  Disability Application Date: Case Worker:   Emergency Contact Information Contact Information    Name Relation Home Work Mobile   Overfield,Bernice Spouse [phone]  [phone]   Ewer,Deborah Daughter   [phone]   [facility] Daughter   [phone]   Bomberger,Keedan Son   [phone]     Current Medical History  Patient  Admitting Diagnosis: L-AKA due to PVD and h/o R-BKA  History of Present Illness: Randy [name] is an 82 y.o. 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 Patient elected to undergo L-AKA on 10/10/15 by Dr. Donnetta Hutching. Post op has had bleeding from stump requiring dressing changes. PT/OT evaluations done and recommended CIR in anticipation of extensive rehab needs. Patient was [facility] to [facility] 10/12/15-10/21/15 and was participating in his therapies. However, 10/21/15 he developed chest pain and his stay was interrupted due to a transfer back to acute care. Cardiology consulted and found that chest pain was likely due to demand ischemia in setting of severe HTN and probable mild fluid overload. Now improved with BP control. Troponin not significantly elevated. Medication was adjusted and new guidelines were provided. PT/OT re-evaluations continue to recommend CIR and patient would like to complete his stay as well as family education prior to going home. Plan to re-admit to CIR today.      Past Medical History  Past Medical History  Diagnosis Date  . Hypercholesterolemia   . Gout   . Nonischemic cardiomyopathy (HCC)     LVEF 35-40%  . Anemia of chronic disease   . Essential hypertension   . Arthritis   . Peripheral vascular disease ([place])     Status post right below knee amputation for a nonhealing wound of the right foot 12/2014  . History of pneumonia 2014  . CAD (coronary artery disease)     Moderate multivessel disease 07/2015 - managed medically  . History of myopericarditis 2015    07/2015  . Stroke [facility])     "they said I did" "I did not know anything about it"  . ESRD on hemodialysis [facility])     M/W/F in [place] - Dr. Florene Glen  . Constipation   . History of blood transfusion   . Pneumonia    . Acute myopericarditis  2015    Family History  family history includes Cancer in his sister; Colon cancer in his brother and brother. There is no history of Anesthesia problems, Hypotension, Malignant hyperthermia, or Pseudochol deficiency.  Prior Rehab/Hospitalizations:  Has the patient had major surgery during 100 days prior to admission? No  Current Medications   Current facility-administered medications:  . acetaminophen (TYLENOL) tablet 650 mg, 650 mg, Oral, Q4H PRN, Janece Canterbury, MD . amLODipine (NORVASC) tablet 10 mg, 10 mg, Oral, Daily, Janece Canterbury, MD, 10 mg at 10/23/15 0856 . aspirin chewable tablet 81 mg, 81 mg, Oral, Daily, Janece Canterbury, MD, 81 mg at 10/23/15 0856 . atorvastatin (LIPITOR) tablet 40 mg, 40 mg, Oral, q1800, Janece Canterbury, MD, 40 mg at 10/22/15 1740 . brinzolamide (AZOPT) 1 % ophthalmic suspension 1 drop, 1 drop, Both Eyes, BID, 1 drop at 10/22/15 2250 **AND** brimonidine (ALPHAGAN) 0.2 % ophthalmic solution 1 drop, 1 drop, Both Eyes, BID, Janece Canterbury, MD, 1 drop at 10/22/15 2257 . calcitRIOL (ROCALTROL) capsule 0.5 mcg, 0.5 mcg, Oral, Daily, Janece Canterbury, MD, 0.5 mcg at 10/23/15 0858 . carvedilol (COREG) tablet 25 mg, 25 mg, Oral, BID WC, Janece Canterbury, MD, 25 mg at 10/23/15 1359 . [START ON 10/25/2015] Darbepoetin Alfa (ARANESP) injection 200 mcg, 200 mcg, Intravenous, Q Wed-HD, Corliss Parish, MD . feeding supplement (BOOST / RESOURCE BREEZE) liquid 1 Container, 1 Container, Oral, TID BM, Janece Canterbury, MD, 1 Container at 10/23/15 1359 . gabapentin (NEURONTIN) capsule 100 mg, 100 mg, Oral, QHS, Janece Canterbury, MD, 100 mg at 10/22/15 2255 . heparin injection 5,000 Units, 5,000 Units, Subcutaneous, 3 times per day, Janece Canterbury, MD, 5,000 Units at 10/23/15 [phone] . hydrALAZINE (APRESOLINE) injection 10 mg, 10 mg, Intravenous, Q6H PRN, Barton Dubois, MD . isosorbide mononitrate (IMDUR) 24 hr tablet 30 mg, 30 mg, Oral,  Daily, Barton Dubois, MD, 30 mg at 10/23/15 0857 . latanoprost (XALATAN) 0.005 % ophthalmic solution 1 drop, 1 drop, Both Eyes, QHS, Janece Canterbury, MD, 1 drop at 10/22/15 2259 . lisinopril (PRINIVIL,ZESTRIL) tablet 10 mg, 10 mg, Oral, Daily, Janece Canterbury, MD, 10 mg at 10/23/15 0858 . multivitamin (RENA-VIT) tablet 1 tablet, 1 tablet, Oral, QHS, Janece Canterbury, MD, 1 tablet at 10/22/15 2255 . ondansetron (ZOFRAN) injection 4 mg, 4 mg, Intravenous, Q6H PRN, Janece Canterbury, MD . oxyCODONE (Oxy IR/ROXICODONE) immediate release tablet 10 mg, 10 mg, Oral, Q4H PRN, Janece Canterbury, MD, 10 mg at 10/23/15 1311 . pantoprazole (PROTONIX) EC tablet 40 mg, 40 mg, Oral, Q1200, Barton Dubois, MD, 40 mg at 10/22/15 1229 . polyvinyl alcohol (LIQUIFILM TEARS) 1.4 % ophthalmic solution 1 drop, 1 drop, Both Eyes, PRN, Janece Canterbury, MD . sevelamer carbonate (RENVELA) tablet 1,600 mg, 1,600 mg, Oral, With snacks, Janece Canterbury, MD, 1,600 mg at 10/21/15 2230 . sevelamer carbonate (RENVELA) tablet 2,400 mg, 2,400 mg, Oral, TID WC, Janece Canterbury, MD, 2,400 mg at 10/23/15 0857  Patients Current Diet: Diet renal with fluid restriction Fluid restriction:: 1200 mL Fluid; Room service appropriate?: Yes; Fluid consistency:: Thin  Precautions / Restrictions Precautions Precautions: Fall Precaution Comments: recent Lt AKA, h/o Rt BKA Restrictions Weight Bearing Restrictions: Yes LLE Weight Bearing: Non weight bearing   Has the patient had 2 or more falls or a fall with injury in the past year?No  Prior Activity Level Limited Community (1-2x/wk): Patient goes out of the house for doctor and HD appointments. A friend provides transportation to and from HD.  Home Assistive Devices / Equipment Home Equipment: Environmental consultant - 2 wheels, Cane - quad,  Shower seat, Wheelchair - manual, Transport planner, Bedside commode  Prior Device Use: Indicate devices/aids used by the patient prior to current illness,  exacerbation or injury? Manual wheelchair and Right BKA prosthesis  Prior Functional Level Prior Function Level of Independence: Needs assistance Gait / Transfers Assistance Needed: Pt reports he mostly performed stand pivot transfers to w/c PTA due to pain in Lt LE. Prior to pain he reports he was ambulatory  ADL's / Homemaking Assistance Needed: Wife assists pt to get down into jetted tub and washes pt's back, wife performs all housekeeping and meal prep  Self Care: Did the patient need help bathing, dressing, using the toilet or eating? Needed some help  Indoor Mobility: Did the patient need assistance with walking from room to room (with or without device)? Independent with wheelchair  Stairs: Did the patient need assistance with internal or external stairs (with or without device)? Needed some help. Used a wheelchair ramp prior to admission   Functional Cognition: Did the patient need help planning regular tasks such as shopping or remembering to take medications? Needed some help  Current Functional Level Cognition  Overall Cognitive Status: Within Functional Limits for tasks assessed Orientation Level: Oriented X4   Extremity Assessment (includes Sensation/Coordination)  Upper Extremity Assessment: Generalized weakness  Lower Extremity Assessment: Defer to PT evaluation RLE Deficits / Details: mildly weak proximally, but functional LLE Deficits / Details: grossly 3+/5    ADLs  Overall ADL's : Needs assistance/impaired Eating/Feeding: Modified independent Grooming: Wash/dry hands, Wash/dry face, Oral care, Supervision/safety, Sitting Upper Body Bathing: Set up, Sitting Lower Body Bathing: Moderate assistance, Bed level Upper Body Dressing : Sitting, Minimal assistance Lower Body Dressing: Moderate assistance, Sitting/lateral leans Toilet Transfer: Total assistance Toileting- Clothing Manipulation and Hygiene: Maximal assistance, Sitting/lateral lean General  ADL Comments: Pt is very motivated     Mobility  Overal bed mobility: Needs Assistance Bed Mobility: Supine to Sit, Sit to Supine Supine to sit: Min guard, HOB elevated Sit to supine: Supervision General bed mobility comments: No physical assist for supine>sit. Increased time and definite use of bed rail. Min guard for pt's safety.    Transfers  Overall transfer level: Needs assistance Equipment used: Rolling walker (2 wheeled), None Transfers: Sit to/from Stand, Licensed conveyancer to Stand: Max assist Anterior-Posterior transfers: Min guard General transfer comment: did not attempt due to pt going for HD soon     Ambulation / Gait / Stairs / Office manager / Balance Balance Overall balance assessment: Needs assistance Sitting-balance support: No upper extremity supported Sitting balance-Leahy Scale: Fair Standing balance support: Bilateral upper extremity supported, During functional activity Standing balance-Leahy Scale: Zero Standing balance comment: max assist for squat    Special needs/care consideration BiPAP/CPAP: No CPM: No Continuous Drip IV: No Dialysis: Yes Days: M,W,F Life Vest: No Oxygen: No Special Bed: No Trach Size: N/A Wound Vac (area): No Location: N/A Skin: WDL Location: N/A Bowel mgmt: 10/20/15 per RN report  Bladder mgmt: Minimal urine production due to HD  Diabetic mgmt: No     Previous Home Environment Living Arrangements: Spouse/significant other Available Help at Discharge: Family, Available 24 hours/day (wife) Type of Home: House Home Layout: One level Home Access: Ramped entrance  Discharge Living Setting Plans for Discharge Living Setting: Patient's home Type of Home at Discharge: House Discharge Home Layout: One level Discharge Home Access: Stairs to enter, Ramped entrance Entrance Stairs-Rails: None Entrance Stairs-Number of Steps:  2 Discharge Bathroom Shower/Tub: Tub/shower  unit Discharge Bathroom Toilet: Handicapped height Discharge Bathroom Accessibility: Yes How Accessible: Accessible via wheelchair Does the patient have any problems obtaining your medications?: No  Social/Family/Support Systems Patient Roles: Spouse, Parent (4 children in [place]) Contact Information: [phone] Anticipated Caregiver: wife Mead Chea and niece Jeb Roesel who assists PRN. Pt said that Ivin Booty would help too, as would other children when they are not working. Ability/Limitations of Caregiver: Oris Drone reports she drives and walks short distances without assist; needs transport chair she visits pt. in the hospital Caregiver Availability: 24/7 Discharge Plan Discussed with Primary Caregiver: Yes Is Caregiver In Agreement with Plan?: Yes Does Caregiver/Family have Issues with Lodging/Transportation while Pt is in Rehab?: No   Goals/Additional Needs Patient/Family Goal for Rehab: supervision and modified independent for PT/OT Expected length of stay: 5 days Cultural Considerations: none Dietary Needs: renal diet with 1200 ml fluid restrictions , thin liquids Equipment Needs: TBA Special Service Needs: HD Pt/Family Agrees to Admission and willing to participate: Yes Program Orientation Provided & Reviewed with Pt/Caregiver Including Roles & Responsibilities: Yes  Decrease burden of Care through IP rehab admission: Other N/A  Possible need for SNF placement upon discharge: Not anticipated   Patient Condition: This patient's medical and functional status has changed since the original consult dated: 10/11/15 in which the Rehabilitation Physician determined and documented that the patient's condition is appropriate for intensive rehabilitative care in an inpatient rehabilitation facility. See "History of Present Illness" (above) for medical update. Functional changes are: per PT/OT re-evaluations patient continues to require Mod-Max  assist with transfers and ADLs. Patient's medical and functional status update has been discussed with the Rehabilitation physician and patient remains appropriate for inpatient rehabilitation. Will admit to inpatient rehab today.  Preadmission Screen Completed By: Randy [name], 10/23/2015 2:36 PM ______________________________________________________________________  Discussed status with Dr. Posey Pronto on 10/23/15 at 1435 and received telephone approval for admission today.  Admission Coordinator: Randy [name], time 1435/Date 10/23/15          Cosigned by: [name], MD at 10/23/2015 2:39 PM  Revision History     Date/Time User Provider Type Action   10/23/2015 2:39 PM [name], MD Physician Cosign   10/23/2015 2:37 PM Randy [name] Rehab Admission Coordinator Sign

## 2015-10-23 NOTE — PMR Pre-admission (Signed)
PMR Admission Coordinator Pre-Admission Assessment  Patient: Randy [name] is an 82 y.o., male MRN: [id number] DOB: 1932-08-28 Height:  5\' 9"  (175.3 cm) Weight: 63 kg (138 lb 14.2 oz)              Insurance Information HMO: yes    PPO:      PCP:      IPA:      80/20:      OTHER:  PRIMARY: BCBS Medicare      Policy#: [id number]      Subscriber: self CM Name: Dallas Breeding       Phone#: [phone]     Fax#: [phone] Pre-Cert#:                   Employer: retired Benefits:  Phone #: [phone]     Name: Driscilla Moats. Date: 09/24/15     Deduct: $0      Out of Pocket Max: $4900      Life Max: none CIR: $275 per day, days 1-6; $0 days 7 and beyond      SNF: $0, days 1-20; $164.50 days 21-100 Outpatient: $40 copay      Home Health: 100%    DME: 80%     Co-Pay: 20% Providers: in network  SECONDARY:       Policy#:       Subscriber:  CM Name:       Phone#:      Fax#:  Pre-Cert#:       Employer:  Benefits:  Phone #:      Name:  Eff. Date:      Deduct:       Out of Pocket Max:       Life Max:  CIR:       SNF:  Outpatient:      Co-Pay:  Home Health:       Co-Pay:  DME:      Co-Pay:   Medicaid Application Date:       Case Manager:  Disability Application Date:       Case Worker:   Emergency Contact Information Contact Information    Name Relation Home Work Mobile   Bagby,Bernice Spouse [phone]  [phone]   Avakian,Deborah Daughter   [phone]   [facility] Daughter   [phone]   Byland,Lewin Son   [phone]     Current Medical History  Patient Admitting Diagnosis: L-AKA due to PVD and h/o R-BKA  History of Present Illness: Randy [name] is an 82 y.o. 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 Patient elected to undergo L-AKA on 10/10/15 by Dr. Donnetta Hutching. Post op has had bleeding from stump requiring dressing changes. PT/OT  evaluations done and recommended CIR in anticipation of extensive rehab needs. Patient was admitted to [facility] 10/12/15-10/21/15 and was participating in his therapies. However, 10/21/15 he developed chest pain and his stay was interrupted due to a transfer back to acute care. Cardiology consulted and found that chest pain was likely due to demand ischemia in setting of severe HTN and probable mild fluid overload. Now improved with BP control. Troponin not significantly elevated. Medication was adjusted and new guidelines were provided. PT/OT re-evaluations continue to recommend CIR and patient would like to complete his stay as well as family education prior to going home. Plan to re-admit to CIR today.        Past Medical  History  Past Medical History  Diagnosis Date  . Hypercholesterolemia   . Gout   . Nonischemic cardiomyopathy (HCC)     LVEF 35-40%  . Anemia of chronic disease   . Essential hypertension   . Arthritis   . Peripheral vascular disease ([place])     Status post right below knee amputation for a nonhealing wound of the right foot 12/2014  . History of pneumonia 2014  . CAD (coronary artery disease)     Moderate multivessel disease 07/2015 - managed medically  . History of myopericarditis 2015    07/2015  . Stroke [facility])     "they said I did"  "I did not know anything about it"  . ESRD on hemodialysis [facility])     M/W/F in [place] - Dr. Florene Glen  . Constipation   . History of blood transfusion   . Pneumonia   . Acute myopericarditis 2015    Family History  family history includes Cancer in his sister; Colon cancer in his brother and brother. There is no history of Anesthesia problems, Hypotension, Malignant hyperthermia, or Pseudochol deficiency.  Prior Rehab/Hospitalizations:  Has the patient had major surgery during 100 days prior to admission? No  Current Medications   Current facility-administered medications:  .  acetaminophen (TYLENOL) tablet 650 mg, 650 mg, Oral, Q4H  PRN, Janece Canterbury, MD .  amLODipine (NORVASC) tablet 10 mg, 10 mg, Oral, Daily, Janece Canterbury, MD, 10 mg at 10/23/15 0856 .  aspirin chewable tablet 81 mg, 81 mg, Oral, Daily, Janece Canterbury, MD, 81 mg at 10/23/15 0856 .  atorvastatin (LIPITOR) tablet 40 mg, 40 mg, Oral, q1800, Janece Canterbury, MD, 40 mg at 10/22/15 1740 .  brinzolamide (AZOPT) 1 % ophthalmic suspension 1 drop, 1 drop, Both Eyes, BID, 1 drop at 10/22/15 2250 **AND** brimonidine (ALPHAGAN) 0.2 % ophthalmic solution 1 drop, 1 drop, Both Eyes, BID, Janece Canterbury, MD, 1 drop at 10/22/15 2257 .  calcitRIOL (ROCALTROL) capsule 0.5 mcg, 0.5 mcg, Oral, Daily, Janece Canterbury, MD, 0.5 mcg at 10/23/15 0858 .  carvedilol (COREG) tablet 25 mg, 25 mg, Oral, BID WC, Janece Canterbury, MD, 25 mg at 10/23/15 1359 .  [START ON 10/25/2015] Darbepoetin Alfa (ARANESP) injection 200 mcg, 200 mcg, Intravenous, Q Wed-HD, Corliss Parish, MD .  feeding supplement (BOOST / RESOURCE BREEZE) liquid 1 Container, 1 Container, Oral, TID BM, Janece Canterbury, MD, 1 Container at 10/23/15 1359 .  gabapentin (NEURONTIN) capsule 100 mg, 100 mg, Oral, QHS, Janece Canterbury, MD, 100 mg at 10/22/15 2255 .  heparin injection 5,000 Units, 5,000 Units, Subcutaneous, 3 times per day, Janece Canterbury, MD, 5,000 Units at 10/23/15 [phone] .  hydrALAZINE (APRESOLINE) injection 10 mg, 10 mg, Intravenous, Q6H PRN, Barton Dubois, MD .  isosorbide mononitrate (IMDUR) 24 hr tablet 30 mg, 30 mg, Oral, Daily, Barton Dubois, MD, 30 mg at 10/23/15 0857 .  latanoprost (XALATAN) 0.005 % ophthalmic solution 1 drop, 1 drop, Both Eyes, QHS, Janece Canterbury, MD, 1 drop at 10/22/15 2259 .  lisinopril (PRINIVIL,ZESTRIL) tablet 10 mg, 10 mg, Oral, Daily, Janece Canterbury, MD, 10 mg at 10/23/15 0858 .  multivitamin (RENA-VIT) tablet 1 tablet, 1 tablet, Oral, QHS, Janece Canterbury, MD, 1 tablet at 10/22/15 2255 .  ondansetron (ZOFRAN) injection 4 mg, 4 mg, Intravenous, Q6H PRN, Janece Canterbury,  MD .  oxyCODONE (Oxy IR/ROXICODONE) immediate release tablet 10 mg, 10 mg, Oral, Q4H PRN, Janece Canterbury, MD, 10 mg at 10/23/15 1311 .  pantoprazole (PROTONIX) EC tablet 40 mg, 40 mg,  Oral, Q1200, Barton Dubois, MD, 40 mg at 10/22/15 1229 .  polyvinyl alcohol (LIQUIFILM TEARS) 1.4 % ophthalmic solution 1 drop, 1 drop, Both Eyes, PRN, Janece Canterbury, MD .  sevelamer carbonate (RENVELA) tablet 1,600 mg, 1,600 mg, Oral, With snacks, Janece Canterbury, MD, 1,600 mg at 10/21/15 2230 .  sevelamer carbonate (RENVELA) tablet 2,400 mg, 2,400 mg, Oral, TID WC, Janece Canterbury, MD, 2,400 mg at 10/23/15 0857  Patients Current Diet: Diet renal with fluid restriction Fluid restriction:: 1200 mL Fluid; Room service appropriate?: Yes; Fluid consistency:: Thin  Precautions / Restrictions Precautions Precautions: Fall Precaution Comments: recent Lt AKA, h/o Rt BKA Restrictions Weight Bearing Restrictions: Yes LLE Weight Bearing: Non weight bearing   Has the patient had 2 or more falls or a fall with injury in the past year?No  Prior Activity Level Limited Community (1-2x/wk): Patient goes out of the house for doctor and HD appointments.  A friend provides transportation to and from HD.  Home Assistive Devices / Equipment Home Equipment: Environmental consultant - 2 wheels, [employer] - quad, Civil engineer, contracting, Wheelchair - manual, Transport planner, Bedside commode  Prior Device Use: Indicate devices/aids used by the patient prior to current illness, exacerbation or injury? Manual wheelchair and Right BKA prosthesis  Prior Functional Level Prior Function Level of Independence: Needs assistance Gait / Transfers Assistance Needed: Pt reports he mostly performed stand pivot transfers  to w/c PTA due to pain in Lt LE.  Prior to pain he reports he was ambulatory  ADL's / Homemaking Assistance Needed: Wife assists pt to get down into jetted tub and washes pt's back, wife performs all housekeeping and meal prep  Self Care: Did the patient  need help bathing, dressing, using the toilet or eating?  Needed some help  Indoor Mobility: Did the patient need assistance with walking from room to room (with or without device)? Independent with wheelchair  Stairs: Did the patient need assistance with internal or external stairs (with or without device)? Needed some help. Used a wheelchair ramp prior to admission   Functional Cognition: Did the patient need help planning regular tasks such as shopping or remembering to take medications? Needed some help  Current Functional Level Cognition  Overall Cognitive Status: Within Functional Limits for tasks assessed Orientation Level: Oriented X4    Extremity Assessment (includes Sensation/Coordination)  Upper Extremity Assessment: Generalized weakness  Lower Extremity Assessment: Defer to PT evaluation RLE Deficits / Details: mildly weak proximally, but functional LLE Deficits / Details: grossly 3+/5    ADLs  Overall ADL's : Needs assistance/impaired Eating/Feeding: Modified independent Grooming: Wash/dry hands, Wash/dry face, Oral care, Supervision/safety, Sitting Upper Body Bathing: Set up, Sitting Lower Body Bathing: Moderate assistance, Bed level Upper Body Dressing : Sitting, Minimal assistance Lower Body Dressing: Moderate assistance, Sitting/lateral leans Toilet Transfer: Total assistance Toileting- Clothing Manipulation and Hygiene: Maximal assistance, Sitting/lateral lean General ADL Comments: Pt is very motivated     Mobility  Overal bed mobility: Needs Assistance Bed Mobility: Supine to Sit, Sit to Supine Supine to sit: Min guard, HOB elevated Sit to supine: Supervision General bed mobility comments: No physical assist for supine>sit.  Increased time and definite use of bed rail.  Min guard for pt's safety.    Transfers  Overall transfer level: Needs assistance Equipment used: Rolling walker (2 wheeled), None Transfers: Sit to/from Stand, Sales promotion account executive Sit to Stand: Max assist Anterior-Posterior transfers: Min guard General transfer comment: did not attempt due to pt going for HD soon     Ambulation /  Gait / Stairs / Office manager / Balance Balance Overall balance assessment: Needs assistance Sitting-balance support: No upper extremity supported Sitting balance-Leahy Scale: Fair Standing balance support: Bilateral upper extremity supported, During functional activity Standing balance-Leahy Scale: Zero Standing balance comment: max assist for squat    Special needs/care consideration BiPAP/CPAP: No CPM: No Continuous Drip IV: No Dialysis: Yes        Days: M,W,F Life Vest: No Oxygen: No Special Bed: No Trach Size: N/A Wound Vac (area): No      Location: N/A Skin: WDL                              Location: N/A Bowel mgmt: 10/20/15 per RN report   Bladder mgmt: Minimal urine production due to HD  Diabetic mgmt: No     Previous Home Environment Living Arrangements: Spouse/significant other Available Help at Discharge: Family, Available 24 hours/day (wife) Type of Home: House Home Layout: One level Home Access: [place] entrance  Discharge Living Setting Plans for Discharge Living Setting: Patient's home Type of Home at Discharge: House Discharge Home Layout: One level Discharge Home Access: Stairs to enter, Ramped entrance Entrance Stairs-Rails: None Entrance Stairs-Number of Steps: 2 Discharge Bathroom Shower/Tub: Tub/shower unit Discharge Bathroom Toilet: Handicapped height Discharge Bathroom Accessibility: Yes How Accessible: Accessible via wheelchair Does the patient have any problems obtaining your medications?: No  Social/Family/Support Systems Patient Roles: Spouse, Parent (4 children in [place]) Contact Information: [phone] Anticipated Caregiver: wife Zakarya Frischman and niece Bertram Gipe who assists PRN.  Pt said that Ivin Booty would help too, as would other children when they are not  working. Ability/Limitations of Caregiver: Oris Drone reports she drives and walks short distances without assist; needs transport chair she visits pt. in the hospital Caregiver Availability: 24/7 Discharge Plan Discussed with Primary Caregiver: Yes Is Caregiver In Agreement with Plan?: Yes Does Caregiver/Family have Issues with Lodging/Transportation while Pt is in Rehab?: No   Goals/Additional Needs Patient/Family Goal for Rehab: supervision and modified independent for PT/OT Expected length of stay: 5 days Cultural Considerations: none Dietary Needs: renal diet with 1200 ml fluid restrictions , thin liquids Equipment Needs: TBA Special Service Needs: HD Pt/Family Agrees to Admission and willing to participate: Yes Program Orientation Provided & Reviewed with Pt/Caregiver Including Roles  & Responsibilities: Yes  Decrease burden of Care through IP rehab admission: Other N/A  Possible need for SNF placement upon discharge: Not anticipated   Patient Condition: This patient's medical and functional status has changed since the original consult dated: 10/11/15 in which the Rehabilitation Physician determined and documented that the patient's condition is appropriate for intensive rehabilitative care in an inpatient rehabilitation facility. See "History of Present Illness" (above) for medical update. Functional changes are: per PT/OT re-evaluations patient continues to require Mod-Max assist with transfers and ADLs. Patient's medical and functional status update has been discussed with the Rehabilitation physician and patient remains appropriate for inpatient rehabilitation. Will admit to inpatient rehab today.  Preadmission Screen Completed By:  Gunnar Fusi, 10/23/2015 2:36 PM ______________________________________________________________________   Discussed status with Dr. Posey Pronto on 10/23/15 at 1435 and received telephone approval for admission today.  Admission Coordinator:  Gunnar Fusi,  time 1435/Date 10/23/15

## 2015-10-23 NOTE — Progress Notes (Signed)
Inpatient Rehabilitation  Note that patient discharged from [facility] back to acute care over the weekend due to chest pain. Met with patient, who is interested in returning to rehab to finish his therapy and family education prior to discharge home.  I initiated insurance authorization and will update team as I know.    Carmelia Roller., CCC/SLP Admission Coordinator  [facility]  Cell [phone]

## 2015-10-23 NOTE — Progress Notes (Signed)
Occupational Therapy Evaluation Addendum:    November 08, 2015 1000  OT G-codes **NOT FOR INPATIENT CLASS**  Functional Limitation Self care  Self Care Current Status [id number]) CL  Self Care Goal Status [id number]) Riley Lam, OTR/L [phone]

## 2015-10-23 NOTE — Care Management Note (Signed)
Case Management Note  Patient Details  Name: Randy [name] MRN: [id number] Date of Birth: April 04, 1932  Subjective/Objective:   Pt admitted for chest pain rule out. Plan is to return to Inpatient Rehab post HD pending insurance authorization.                  Action/Plan: No further needs from CM at this time.    Expected Discharge Date:                  Expected Discharge Plan:  [place]  In-House Referral:     Discharge planning Services  CM Consult  Post Acute Care Choice:  NA Choice offered to:  NA  DME Arranged:  N/A DME Agency:  NA  HH Arranged:  NA HH Agency:  NA  Status of Service:  Completed, signed off  Medicare Important Message Given:    Date Medicare IM Given:    Medicare IM give by:    Date Additional Medicare IM Given:    Additional Medicare Important Message give by:     If discussed at [place] of Stay Meetings, dates discussed:    Additional Comments:  Bethena Roys, RN 10/23/2015, 11:43 AM

## 2015-10-23 NOTE — Interval H&P Note (Deleted)
Randy [name] was admitted today to Inpatient Rehabilitation with the diagnosis of right BKA.  [name] history has been reviewed, patient examined, and there is no change in status.  Patient continues to be appropriate for intensive inpatient rehabilitation.  I have reviewed the patient's chart and labs.  Questions were answered to the patient's satisfaction. [name] has been reviewed and assessment remains appropriate.  [name] 10/23/2015, 10:57 PM

## 2015-10-23 NOTE — Progress Notes (Addendum)
SUBJECTIVE:  No chest pain.  BP has been netter controlled.  OBJECTIVE:   Vitals:   Filed Vitals:   10/22/15 1934 10/22/15 2324 10/23/15 0425 10/23/15 0545  BP: 138/59 136/60 128/61 124/55  Pulse: 72 71 75 72  Temp: 98.1 F (36.7 C) 98.8 F (37.1 C) 98.2 F (36.8 C)   TempSrc: Oral Oral Oral   Resp: 18 18 20 18   Weight:   132 lb 4.4 oz (60 kg)   SpO2: 97% 98% 94% 92%   I&O's:   Intake/Output Summary (Last 24 hours) at 10/23/15 1116 Last data filed at 10/22/15 1800  Gross per 24 hour  Intake    540 ml  Output      0 ml  Net    540 ml   TELEMETRY: Reviewed telemetry pt in NSR, PACs:     PHYSICAL EXAM General: Well developed, well nourished, in no acute distress Head:   Normal cephalic and atramatic  Lungs:  Clear bilaterally to auscultation. Heart:   HRRR S1 S2  No JVD.   Abdomen: abdomen soft and non-tender Msk:  Back normal,  Normal strength and tone for age. Extremities:   No edema.  Bilateral LE amputations Neuro: Alert and oriented. Psych:  Normal affect, responds appropriately Skin: No rash   LABS: Basic Metabolic Panel:  Recent Labs  10/20/15 1600 10/22/15 0542  NA 135 134*  K 4.8 4.0  CL 94* 101  CO2 28 24  GLUCOSE 162* 277*  BUN 57* 14  CREATININE 7.60* 1.00  CALCIUM 8.9 8.4*  PHOS 5.8*  --    Liver Function Tests:  Recent Labs  10/20/15 1600  ALBUMIN 1.9*   No results for input(s): LIPASE, AMYLASE in the last 72 hours. CBC:  Recent Labs  10/22/15 0728  WBC 5.5  HGB 8.7*  HCT 29.1*  MCV 98.0  PLT 267   Cardiac Enzymes:  Recent Labs  10/21/15 1350 10/21/15 1800 10/21/15 2300  TROPONINI 0.24* 0.31* 0.29*   BNP: Invalid input(s): POCBNP D-Dimer: No results for input(s): DDIMER in the last 72 hours. Hemoglobin A1C:  Recent Labs  10/21/15 1350  HGBA1C 5.4   Fasting Lipid Panel:  Recent Labs  10/22/15 0542  CHOL 255*  HDL 27*  LDLCALC UNABLE TO CALCULATE IF TRIGLYCERIDE OVER 400 mg/dL  TRIG 1030*  CHOLHDL  9.4   Thyroid Function Tests:  Recent Labs  10/21/15 1350  TSH 1.063   Anemia Panel: No results for input(s): VITAMINB12, FOLATE, FERRITIN, TIBC, IRON, RETICCTPCT in the last 72 hours. Coag Panel:   Lab Results  Component Value Date   INR 1.03 01/05/2015   INR 1.12 12/30/2014   INR 1.04 09/24/2014    RADIOLOGY: Dg Wrist Complete Right  10/20/2015  CLINICAL DATA:  Acute right wrist pain without known injury. EXAM: RIGHT WRIST - COMPLETE 3+ VIEW COMPARISON:  July 10, 2012. FINDINGS: There is no evidence of fracture or dislocation. Vascular calcifications are noted. Narrowing and sclerosis of the first carpometacarpal joint as well as several intercarpal joints is noted. IMPRESSION: Findings consistent with osteoarthritis involving the first carpometacarpal and several intercarpal joints. No acute abnormality seen in the right wrist. Electronically Signed   By: Marijo Conception, M.D.   On: 10/20/2015 09:39   Dg Chest Port 1 View  10/21/2015  CLINICAL DATA:  Dyspnea and mid chest pain EXAM: PORTABLE CHEST 1 VIEW COMPARISON:  08/04/2015; 03/13/2015; 10/20/2014; 09/22/2014; 03/28/2014; 10/08/2012 FINDINGS: Grossly unchanged enlarged cardiac silhouette and mediastinal contours with  atherosclerotic plaque within the thoracic aorta. Grossly unchanged small left-sided effusion with associated left basilar/retrocardiac heterogeneous/consolidative opacities. No new focal airspace opacities. Chronic pulmonary venous congestion without frank evidence of edema. No pneumothorax. Unchanged bones. IMPRESSION: 1. Stable examination without definite superimposed acute cardiopulmonary disease 2. Similar findings of cardiomegaly, pulmonary venous congestion, chronic left-sided effusion and associated left basilar opacities, likely atelectasis or scar. Electronically Signed   By: Sandi Mariscal M.D.   On: 10/21/2015 09:56   Dg Foot Complete Left  10/02/2015  CLINICAL DATA:  Left foot pain.  Swelling and wound.  EXAM: LEFT FOOT - COMPLETE 3+ VIEW COMPARISON:  Radiographs 12/17/2005 FINDINGS: Site of clinical wound not well seen radiographically. No tracking soft tissue air or radiopaque foreign body. There are hammertoe deformity of the digits. No bony destructive change. No fracture or dislocation. Plantar calcaneal spur. Vascular calcifications are seen. Dorsal soft tissue edema. IMPRESSION: No radiographic evidence of osteomyelitis. Dorsal soft tissue edema. Electronically Signed   By: Jeb Levering M.D.   On: 10/02/2015 22:26      ASSESSMENT:   PLAN:  ESRD, HTN, PAD, CAD  1) BP better controlled.  Elevated troponin likely related to demand ischemia in setting of CAD- medically treat angina.  Symptoms are better now.  Stressed importance of BP control.  Dialysis later today.    PAD, left leg stump wrapped. Per internal medicine.   Hydralazine and nitrates for LV dysfunction.  Beta blocker as well. WOuld consider ACE-I since he is already on dialysis.   Jettie Booze, MD  10/23/2015  11:16 AM

## 2015-10-23 NOTE — H&P (Signed)
Physical Medicine and Rehabilitation Admission H&P     CC: L-AKA due to PVD/ History of R-BKA   HPI:   Randy [name] is a 82 y.o. 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 Patient elected to undergo L-AKA on 10/10/15 by Dr. Donnetta Hutching. Post op has had bleeding from stump requiring dressing changes.  Pt had chest pain and was worked up for MI  nd CIR consulted in anticipation of extensive rehab needs.   Review of Systems  Constitutional: Negative.   HENT: Negative.   Eyes: Negative.   Respiratory: Negative.   Genitourinary:        Decreased output related to ESRD  Musculoskeletal:       Mild left stump pain  Skin: Negative.   Neurological: Negative.   Endo/Heme/Allergies: Negative.   Psychiatric/Behavioral: Negative.    All other systems reviewed and negative.     Past Medical History   Diagnosis  Date   .  Hypercholesterolemia     .  Gout     .  Nonischemic cardiomyopathy (HCC)         LVEF 35-40%   .  Anemia of chronic disease     .  Essential hypertension     .  Arthritis     .  Peripheral vascular disease ([place])         Status post right below knee amputation for a nonhealing wound of the right foot 12/2014   .  History of pneumonia  2014   .  CAD (coronary artery disease)         Moderate multivessel disease 07/2015 - managed medically   .  History of myopericarditis  2015       07/2015   .  Stroke [facility])         "they said I did"  "I did not know anything about it"   .  ESRD on hemodialysis [facility])         M/W/F in [place] - Dr. Florene Glen   .  Constipation     .  History of blood transfusion     .  Pneumonia     .  Acute myopericarditis  2015       Past Surgical History   Procedure  Laterality  Date   .  Knee arthroscopy  Right  2007   .  Back surgery       .  Hemorrhoid surgery    1970's   .  Ganglion cyst excision     01/03/2012       Procedure: REMOVAL GANGLION OF WRIST;  Surgeon: Scherry Ran, MD;  Location: [facility];  Service: General;  Laterality: Right;   .  Colonoscopy       .  Av fistula placement    08/24/2012       Procedure: ARTERIOVENOUS (AV) FISTULA CREATION;  Surgeon: Rosetta Posner, MD;  Location: [place];  Service: Vascular;  Laterality: Left;   .  Insertion of dialysis catheter  Right          neck   .  Insertion of dialysis catheter    10/19/2012       Procedure: INSERTION OF DIALYSIS CATHETER;  Surgeon: Rosetta Posner, MD;  Location: [place];  Service: Vascular;  Laterality: N/A;  REMOVE TEMPORARY CATH   .  Cholecystectomy       .  Lumbar disc surgery    2004   .  Left heart catheterization with coronary angiogram  N/A  08/17/2014       Procedure: LEFT HEART CATHETERIZATION WITH CORONARY ANGIOGRAM;  Surgeon: Larey Dresser, MD;  Location: [facility] CATH LAB;  Service: Cardiovascular;  Laterality: N/A;   .  Colonoscopy  Left  09/26/2014       Procedure: COLONOSCOPY;  Surgeon: Arta Silence, MD;  Location: [facility] ENDOSCOPY;  Service: Endoscopy;  Laterality: Left;   .  Amputation  Right  01/05/2015       Procedure: AMPUTATION BELOW KNEE;  Surgeon: Angelia Mould, MD;  Location: [place];  Service: Vascular;  Laterality: Right;   .  Capd removal  N/A  03/13/2015       Procedure: CONTINUOUS AMBULATORY PERITONEAL DIALYSIS  (CAPD) CATHETER REMOVAL;  Surgeon: Coralie Keens, MD;  Location: [place];  Service: General;  Laterality: N/A;   .  Cardiac catheterization       .  Above knee leg amputation  Left  10/10/2015   .  Amputation  Left  10/10/2015       Procedure: AMPUTATION ABOVE KNEE LEFT;  Surgeon: Angelia Mould, MD;  Location: [facility] OR;  Service: Vascular;  Laterality: Left;       Family History   Problem  Relation  Age of Onset   .  Arthritis       .  Cancer       .  Kidney disease       .  Anesthesia problems  Neg Hx     .  Hypotension  Neg Hx     .  Malignant hyperthermia  Neg Hx     .   Pseudochol deficiency  Neg Hx     .  Cancer  Sister     .  Colon cancer  Brother     .  Colon cancer  Brother       Social History:  Married. 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 Wife at home can provide supervsion after discharge. Niece provides transportation to HD. He reports that he quit smoking about 17 years ago. His smoking use included Cigarettes. He has a 30 pack-year smoking history. He quit smokeless tobacco use about 21 years ago. His smokeless tobacco use included Chew. He reports that he does not drink alcohol or use illicit drugs.    Allergies: No Known Allergies      Medications Prior to Admission   Medication  Sig  Dispense  Refill   .  amLODipine (NORVASC) 10 MG tablet  Take 1 tablet (10 mg total) by mouth daily.  30 tablet  3   .  aspirin 81 MG tablet  Take 81 mg by mouth daily.       Marland Kitchen  atorvastatin (LIPITOR) 40 MG tablet  Take 1 tablet (40 mg total) by mouth daily at 6 PM.  30 tablet  1   .  calcitRIOL (ROCALTROL) 0.5 MCG capsule  Take 1 capsule (0.5 mcg total) by mouth daily.  30 capsule  3   .  carvedilol (COREG) 25 MG tablet  Take 1 tablet (25 mg total) by mouth 2 (two) times daily with a meal.  60 tablet  1   .  cloNIDine (CATAPRES) 0.2 MG tablet  Take 0.2 mg by mouth 2 (two) times daily as needed (blood pressure >130/27).     10   .  doxycycline (VIBRA-TABS) 100 MG tablet  Take 1 tablet (100 mg total) by mouth every 12 (twelve) hours.  14 tablet  0   .  feeding supplement, RESOURCE BREEZE, (RESOURCE BREEZE) LIQD  Take 1 Container by mouth 3 (three) times daily between meals.  90 Container  5   .  gabapentin (NEURONTIN) 100 MG capsule  Take 1 capsule (100 mg total) by mouth 3 (three) times daily. (Patient taking differently: Take 100 mg by mouth at bedtime. )  60 capsule  0   .  hydrALAZINE (APRESOLINE) 25 MG tablet  Take 1 tablet (25 mg total) by mouth 3 (three) times daily. (Patient taking differently: Take 25 mg by mouth 3 (three) times daily as needed  (High blood pressure). )  90 tablet  3   .  isosorbide mononitrate (IMDUR) 30 MG 24 hr tablet  Take 0.5 tablets (15 mg total) by mouth at bedtime.  45 tablet  3   .  lisinopril (PRINIVIL,ZESTRIL) 10 MG tablet  Take 1 tablet by mouth daily.    2   .  LUMIGAN 0.01 % SOLN  Place 1 drop into both eyes at bedtime.    11   .  multivitamin (RENA-VIT) TABS tablet  Take 1 tablet by mouth daily.       .  Oxycodone HCl 10 MG TABS  Take 10 mg by mouth every 6 (six) hours as needed (for pain).     0   .  oxyCODONE-acetaminophen (PERCOCET/ROXICET) 5-325 MG per tablet  Take 1 tablet by mouth every 6 (six) hours as needed for severe pain.  20 tablet  0   .  sevelamer carbonate (RENVELA) 800 MG tablet  Take 1,600-2,400 mg by mouth See admin instructions. Take 3 tablets (2400 mg) with meals and 2 tablets (1600 mg) with snacks       .  SIMBRINZA 1-0.2 % SUSP  Place 1 drop into both eyes 2 (two) times daily.    11   .  predniSONE (DELTASONE) 20 MG tablet  Take 2 tablets by mouth daily X 2 days; then 1 tablet by mouth daily X 2 days; then 1/2 tablet by mouth daily X 3 days and stop prednisone  9 tablet  0     Home: Home Living Family/patient expects to be discharged to:: Private residence Living Arrangements: Spouse/significant other Available Help at Discharge: Family, Available 24 hours/day Type of Home: House Home Access: Stairs to enter, Ramped entrance Secretary/administrator of Steps: 2 Entrance Stairs-Rails: None Home Layout: One level Bathroom Shower/Tub: Engineer, manufacturing systems: Handicapped height Bathroom Accessibility: Yes Home Equipment: Environmental consultant - 2 wheels, [employer] - quad, Information systems manager, Wheelchair - manual, IT sales professional History: Prior Function Level of Independence: Needs assistance Gait / Transfers Assistance Needed: pt uses w/c as primary means of mobility, transferred independently to bed/toilet/chair, assisted for tub transfers ADL's / Homemaking Assistance Needed: Wife  assists pt to get down into jetted tub and washes pt's back, wife performs all housekeeping and meal prep Comments: Pt does not like to shower.  Functional Status:  Mobility: Bed Mobility Overal bed mobility: Needs Assistance Bed Mobility: Supine to Sit, Sit to Supine, Rolling Rolling: Min guard Supine to sit: Mod assist Sit to supine: Min guard General bed mobility comments: also extra time to move.  pt managed to maneuver himself around in supine, but had more difficulty using arms to scoot in sitting around the bed. Transfers General transfer comment: not observed      ADL:  ADL Overall ADL's : Needs assistance/impaired Eating/Feeding: Independent, Bed level Grooming: Wash/dry hands, Wash/dry face, Minimal assistance, Sitting Upper Body Bathing: Moderate assistance, Sitting Lower Body Bathing: Maximal assistance, Sitting/lateral leans Upper Body Dressing : Minimal assistance, Sitting Lower Body Dressing: Maximal assistance, Sitting/lateral leans General ADL Comments: Pt seen following PT evaluation and having had HD earlier today, fatigued  Cognition: Cognition Overall Cognitive Status: Within Functional Limits for tasks assessed Orientation Level: Oriented X4 Cognition Arousal/Alertness: Awake/alert Behavior During Therapy: WFL for tasks assessed/performed Overall Cognitive Status: Within Functional Limits for tasks assessed    Blood pressure 153/76, pulse 83, temperature 98.1 F (36.7 C), temperature source Oral, resp. rate 17, height 5\' 9"  (1.753 m), weight 59.9 kg (132 lb 0.9 oz), SpO2 98 %. Physical Exam  Nursing note and vitals reviewed. Constitutional: He is oriented to person, place, and time. He appears well-developed and well-nourished.  HENT:   Head: Normocephalic and atraumatic.  Right Ear: External ear normal.  Left Ear: External ear normal.   Mouth/Throat: Oropharynx is clear and moist.  Eyes: Conjunctivae and EOM are normal. Pupils are equal, round,  and reactive to light.  Neck: Normal range of motion. Neck supple. No JVD present.  Cardiovascular: Normal rate and regular rhythm.    Murmur heard. Respiratory: Effort normal and breath sounds normal. No stridor. No respiratory distress. He has no wheezes. He has no rales.  GI: Soft. Bowel sounds are normal. He exhibits no distension. There is no tenderness. There is no rebound.  Genitourinary: Penis normal.  Musculoskeletal:  Right BKA well healed no edema , non tender Left AKA with Ace wrap, no seepage through bandage, mild edema, moderate tenderness to palpation  Neurological: He is alert and oriented to person, place, and time.  Skin: Skin is warm.  Psychiatric: He has a normal mood and affect.  5/5 in BUE and R Hip flexor, LLE has trace hip flexion     Lab Results Last 48 Hours    Results for orders placed or performed during the hospital encounter of 10/10/15 (from the past 48 hour(s))   Basic metabolic panel     Status: Abnormal     Collection Time: 10/11/15  3:29 AM   Result  Value  Ref Range     Sodium  138  135 - 145 mmol/L     Potassium  4.9  3.5 - 5.1 mmol/L     Chloride  97 (L)  101 - 111 mmol/L     CO2  26  22 - 32 mmol/L     Glucose, Bld  79  65 - 99 mg/dL     BUN  53 (H)  6 - 20 mg/dL     Creatinine, Ser  10/13/15 (H)  0.61 - 1.24 mg/dL     Calcium  8.5 (L)  8.9 - 10.3 mg/dL     GFR calc non Af Amer  4 (L)  >60 mL/min     GFR calc Af Amer  5 (L)  >60 mL/min       Comment:  (NOTE)  The eGFR has been calculated using the CKD EPI equation. This calculation has not been validated in all clinical situations. eGFR's persistently <60 mL/min signify possible Chronic Kidney Disease.      Anion gap  15  5 - 15   CBC     Status: Abnormal     Collection Time: 10/11/15  3:29 AM   Result  Value  Ref Range     WBC  8.2  4.0 - 10.5 K/uL     RBC  3.60 (L)  4.22 - 5.81 MIL/uL     Hemoglobin  10.7 (L)  13.0 - 17.0 g/dL     HCT  35.5 (L)  39.0 - 52.0 %     MCV  98.6  78.0 - 100.0  fL     MCH  29.7  26.0 - 34.0 pg     MCHC  30.1  30.0 - 36.0 g/dL     RDW  18.6 (H)  11.5 - 15.5 %     Platelets  242  150 - 400 K/uL   Prealbumin     Status: Abnormal     Collection Time: 10/11/15  8:30 AM   Result  Value  Ref Range     Prealbumin  17.4 (L)  18 - 38 mg/dL   Phosphorus     Status: None     Collection Time: 10/11/15  8:30 AM   Result  Value  Ref Range     Phosphorus  3.6  2.5 - 4.6 mg/dL   Renal function panel     Status: Abnormal     Collection Time: 10/11/15  1:30 PM   Result  Value  Ref Range     Sodium  137  135 - 145 mmol/L     Potassium  3.7  3.5 - 5.1 mmol/L     Chloride  97 (L)  101 - 111 mmol/L     CO2  29  22 - 32 mmol/L     Glucose, Bld  130 (H)  65 - 99 mg/dL     BUN  17  6 - 20 mg/dL     Creatinine, Ser  5.07 (H)  0.61 - 1.24 mg/dL       Comment:  DELTA CHECK NOTED     Calcium  9.0  8.9 - 10.3 mg/dL     Phosphorus  4.2  2.5 - 4.6 mg/dL     Albumin  2.5 (L)  3.5 - 5.0 g/dL     GFR calc non Af Amer  10 (L)  >60 mL/min     GFR calc Af Amer  11 (L)  >60 mL/min       Comment:  (NOTE)  The eGFR has been calculated using the CKD EPI equation. This calculation has not been validated in all clinical situations. eGFR's persistently <60 mL/min signify possible Chronic Kidney Disease.      Anion gap  11  5 - 15   CBC     Status: Abnormal     Collection Time: 10/11/15  1:30 PM   Result  Value  Ref Range     WBC  7.8  4.0 - 10.5 K/uL     RBC  4.00 (L)  4.22 - 5.81 MIL/uL     Hemoglobin  11.7 (L)  13.0 - 17.0 g/dL     HCT  39.2  39.0 - 52.0 %     MCV  98.0  78.0 - 100.0 fL     MCH  29.3  26.0 - 34.0 pg     MCHC  29.8 (L)  30.0 - 36.0 g/dL     RDW  18.7 (H)  11.5 - 15.5 %     Platelets  219  150 - 400 K/uL   Glucose, capillary     Status: Abnormal     Collection Time: 10/11/15  4:11 PM   Result  Value  Ref Range  Glucose-Capillary  183 (H)  65 - 99 mg/dL   Glucose, capillary     Status: Abnormal     Collection Time: 10/11/15  8:57 PM   Result   Value  Ref Range     Glucose-Capillary  105 (H)  65 - 99 mg/dL     Comment 1  Notify RN       Comment 2  Document in Chart     Basic metabolic panel     Status: Abnormal     Collection Time: 10/12/15  2:50 AM   Result  Value  Ref Range     Sodium  132 (L)  135 - 145 mmol/L     Potassium  4.7  3.5 - 5.1 mmol/L       Comment:  DELTA CHECK NOTED     Chloride  93 (L)  101 - 111 mmol/L     CO2  27  22 - 32 mmol/L     Glucose, Bld  116 (H)  65 - 99 mg/dL     BUN  29 (H)  6 - 20 mg/dL     Creatinine, Ser  6.75 (H)  0.61 - 1.24 mg/dL     Calcium  9.0  8.9 - 10.3 mg/dL     GFR calc non Af Amer  7 (L)  >60 mL/min     GFR calc Af Amer  8 (L)  >60 mL/min       Comment:  (NOTE)  The eGFR has been calculated using the CKD EPI equation. This calculation has not been validated in all clinical situations. eGFR's persistently <60 mL/min signify possible Chronic Kidney Disease.      Anion gap  12  5 - 15   CBC     Status: Abnormal     Collection Time: 10/12/15  2:50 AM   Result  Value  Ref Range     WBC  6.7  4.0 - 10.5 K/uL     RBC  3.52 (L)  4.22 - 5.81 MIL/uL     Hemoglobin  10.2 (L)  13.0 - 17.0 g/dL     HCT  34.3 (L)  39.0 - 52.0 %     MCV  97.4  78.0 - 100.0 fL     MCH  29.0  26.0 - 34.0 pg     MCHC  29.7 (L)  30.0 - 36.0 g/dL     RDW  18.5 (H)  11.5 - 15.5 %     Platelets  192  150 - 400 K/uL   Glucose, capillary     Status: Abnormal     Collection Time: 10/12/15  6:10 AM   Result  Value  Ref Range     Glucose-Capillary  102 (H)  65 - 99 mg/dL     Comment 1  Notify RN       Comment 2  Document in Chart         Imaging Results (Last 48 hours)    No results found.       Medical Problem List and Plan: 1.  Decreased mobility and self care    secondary to Left AKA POD #2 with prior hx of R BKA 2.  DVT Prophylaxis/Anticoagulation: Pharmaceutical: Lovenox 3. Pain Management: Oxycodone IR 4. Mood: monitor for reactive depression 5. Neuropsych: This patient is capable of making  decisions on his own behalf. 6. Skin/Wound Care: Dressing changes and ACE wrap to LLE 7. Fluids/Electrolytes/Nutrition: Monitor I/Os, meal intake, may  need dietary consult 8.  MEB:RAXENMM, lisinopril, coreg, with prn hydralazine 9. ESRD:  HD managed by Nephro M-W F 10 Anemia of chronic disease:  CBC in am, Aranesp weekly in HD 11. NISCM/CAD:Lipitor , ASA, Coreg     Post Admission Physician Evaluation: 1. Functional deficits secondary  to secondary to Left AKA 10/10/15 with prior hx of R BKA. 2. Patient is admitted to receive collaborative, interdisciplinary care between the physiatrist, rehab nursing staff, and therapy team. 3. Patient's level of medical complexity and substantial therapy needs in context of that medical necessity cannot be provided at a lesser intensity of care such as a SNF. 4. Patient has experienced substantial functional loss from his/her baseline which was documented above under the "Functional History" and "Functional Status" headings.  Judging by the patient's diagnosis, physical exam, and functional history, the patient has potential for functional progress which will result in measurable gains while on inpatient rehab.  These gains will be of substantial and practical use upon discharge  in facilitating mobility and self-care at the household level. 5. Physiatrist will provide 24 hour management of medical needs as well as oversight of the therapy plan/treatment and provide guidance as appropriate regarding the interaction of the two. 6. 24 hour rehab nursing will assist with bowel management, safety, skin/wound care, disease management, medication administration, pain management and patient education  and help integrate therapy concepts, techniques,education, etc. 7. PT will assess and treat for/with: pre gait, gait training, endurance , safety, equipment, neuromuscular re education.   Goals are: Sup. 8. OT will assess and treat for/with: ADLs, Cognitive perceptual  skills, Neuromuscular re education, safety, endurance, equipment.   Goals are: Sup. Therapy may not proceed with showering this patient until POD 4, then with LLE covered. 9. SLP will assess and treat for/with: NA.  Goals are: NA. 10. Case Management and Social Worker will assess and treat for psychological issues and discharge planning. 11. Team conference will be held weekly to assess progress toward goals and to determine barriers to discharge. 12. Patient will receive at least 3 hours of therapy per day at least 5 days per week. 13. ELOS: 7-11 Days        14. Prognosis: good.    1/19/2017oxycodone IR, aranesp weekly in HD               10/23/15-- Addendum to H&P on 1/19: Mr. Grunewald is currently POD# 13 after L-AKA. This was an interrupted stay due to transfer to acute hospital on 10/20/14 due to chest pain. He was evaluated by Dr. [name] and cardiac enzymes were drawn for work up. This showed some elevation due to demand ischemia in setting of sever HTN. He recommended using hydralazine prn and to avoid catapres.  To continue Imdur and no further work up indicated. He was cleared to return to CIR to complete his rehab program today.

## 2015-10-23 NOTE — Procedures (Signed)
Patient was seen on dialysis and the procedure was supervised.  BFR 400  Via AVF BP is  153/81.   Patient appears to be tolerating treatment well  Randy [name] A 10/23/2015

## 2015-10-23 NOTE — Progress Notes (Signed)
Subjective:   Neph lost track of patient over weekend- missed events- No cos this AM- no CP- cleaned tray /  Objective Vital signs in last 24 hours: Filed Vitals:   10/22/15 1934 10/22/15 2324 10/23/15 0425 10/23/15 0545  BP: 138/59 136/60 128/61 124/55  Pulse: 72 71 75 72  Temp: 98.1 F (36.7 C) 98.8 F (37.1 C) 98.2 F (36.8 C)   TempSrc: Oral Oral Oral   Resp: 18 18 20 18   Weight:   60 kg (132 lb 4.4 oz)   SpO2: 97% 98% 94% 92%   Weight change: 1.2 kg (2 lb 10.3 oz) Physical Exam: General=Alert  Calm , NAD Chest = CTA bilat Card =RRR no m. r. g. Abd= soft nt nd  Ext =L AKA and R BKA,No edema HD access= pos. Bruit LFA AVF  OP HD= [place] MWF 3.5h 62kg 2/2.25 bath LFA AVF Hep 6600  Mircera 100 q 2wks last 1/11 Calc 0.5 ug:   Problem/Plan: 1 SP L AKA 1/17= previously on rehab plans to return home with family  - got sidetracked to acute hosp for CP 2 ESRD =MWF via AVF- on schedule, labs pend pre hd/ [facility] - dont think labs from 1/29 were his crt 1.00??? 3 Vol / HTN =2kg under dry needs hoyer lift wts/ 3 bp meds Monitor bp With HD  4 MBD= correc. ca ^  = Hold Vit d on hd - now resumed/ / phos 6.9 improved to 5.8  on Renvela  2400 ac 5 Anemia = hgb 9.3-->8.7  cont esa/ Fe / 200mg  Aranesp last hd wed  6. CP- think plan is for medical management and to keep BP down - imdur/amlod/coreg 25/lisinopril 10- unclear if will tolerate all those agents  Jkai Arwood A   10/23/2015, 8:46 AM    Labs: Basic Metabolic Panel:  Recent Labs Lab 10/16/15 1935  10/19/15 0201 10/20/15 1600 10/22/15 0542  NA 128*  < > 131* 135 134*  K 6.1*  < > 5.6* 4.8 4.0  CL 90*  < > 91* 94* 101  CO2 22  < > 24 28 24   GLUCOSE 117*  < > 86 162* 277*  BUN 101*  < > 80* 57* 14  CREATININE 11.74*  < > 9.78* 7.60* 1.00  CALCIUM 9.0  < > 8.6* 8.9 8.4*  PHOS 5.8*  --  6.9* 5.8*  --   < > = values in this interval not displayed. Liver Function Tests:  Recent  Labs Lab 10/16/15 1935 10/19/15 0201 10/20/15 1600  ALBUMIN 2.3* 1.9* 1.9*   No results for input(s): LIPASE, AMYLASE in the last 168 hours. No results for input(s): AMMONIA in the last 168 hours. CBC:  Recent Labs Lab 10/19/15 0201 10/20/15 0905 10/22/15 0728  WBC 5.9 5.2 5.5  NEUTROABS  --  3.8  --   HGB 8.6* 9.3* 8.7*  HCT 28.8* 30.5* 29.1*  MCV 96.0 97.8 98.0  PLT 218 254 267   Cardiac Enzymes:  Recent Labs Lab 10/21/15 0936 10/21/15 1350 10/21/15 1800 10/21/15 2300  TROPONINI 0.10* 0.24* 0.31* 0.29*   CBG: No results for input(s): GLUCAP in the last 168 hours.  Studies/Results: Dg Chest Port 1 View  10/21/2015  CLINICAL DATA:  Dyspnea and mid chest pain EXAM: PORTABLE CHEST 1 VIEW COMPARISON:  08/04/2015; 03/13/2015; 10/20/2014; 09/22/2014; 03/28/2014; 10/08/2012 FINDINGS: Grossly unchanged enlarged cardiac silhouette and mediastinal contours with atherosclerotic plaque within the thoracic aorta. Grossly unchanged small left-sided effusion with associated left  basilar/retrocardiac heterogeneous/consolidative opacities. No new focal airspace opacities. Chronic pulmonary venous congestion without frank evidence of edema. No pneumothorax. Unchanged bones. IMPRESSION: 1. Stable examination without definite superimposed acute cardiopulmonary disease 2. Similar findings of cardiomegaly, pulmonary venous congestion, chronic left-sided effusion and associated left basilar opacities, likely atelectasis or scar. Electronically Signed   By: Sandi Mariscal M.D.   On: 10/21/2015 09:56   Medications:   . amLODipine  10 mg Oral Daily  . aspirin  81 mg Oral Daily  . atorvastatin  40 mg Oral q1800  . brinzolamide  1 drop Both Eyes BID   And  . brimonidine  1 drop Both Eyes BID  . calcitRIOL  0.5 mcg Oral Daily  . carvedilol  25 mg Oral BID WC  . feeding supplement  1 Container Oral TID BM  . gabapentin  100 mg Oral QHS  . heparin  5,000 Units Subcutaneous 3 times per day  .  isosorbide mononitrate  30 mg Oral Daily  . latanoprost  1 drop Both Eyes QHS  . lisinopril  10 mg Oral Daily  . multivitamin  1 tablet Oral QHS  . pantoprazole  40 mg Oral Q1200  . sevelamer carbonate  1,600 mg Oral With snacks  . sevelamer carbonate  2,400 mg Oral TID WC

## 2015-10-23 NOTE — Evaluation (Signed)
Occupational Therapy Evaluation Patient Details Name: Randy [name] MRN: [id number] DOB: September 23, 1932 Today's Date: 10/23/2015    History of Present Illness Pt is a 82 y/o M w/ recent left AKA 10/09/14 who was readmitted from inpatient rehab for chest pain.  Pt's PMH includes Rt BKA, gout, PVD, stroke, lumbar disc surgery.   Clinical Impression   Pt admitted with above. He demonstrates the below listed deficits and will benefit from continued OT to maximize safety and independence with BADLs.  PT presents to OT with generalized weakness, decreased balance.  He requires mod - max A overall for ADLs. He is very motivated to regain independence and return home with spouse.  Recommend return to CIR to maximize independence prior to discharge.       Follow Up Recommendations  CIR    Equipment Recommendations  None recommended by OT    Recommendations for Other Services       Precautions / Restrictions Precautions Precautions: Fall Precaution Comments: recent Lt AKA, h/o Rt BKA      Mobility Bed Mobility Overal bed mobility: Needs Assistance Bed Mobility: Supine to Sit;Sit to Supine     Supine to sit: Min guard;HOB elevated Sit to supine: Supervision      Transfers                 General transfer comment: did not attempt due to pt going for HD soon     Balance   Sitting-balance support: No upper extremity supported Sitting balance-Leahy Scale: Fair                                      ADL Overall ADL's : Needs assistance/impaired Eating/Feeding: Modified independent   Grooming: Wash/dry hands;Wash/dry face;Oral care;Supervision/safety;Sitting   Upper Body Bathing: Set up;Sitting   Lower Body Bathing: Moderate assistance;Bed level   Upper Body Dressing : Sitting;Minimal assistance   Lower Body Dressing: Moderate assistance;Sitting/lateral leans   Toilet Transfer: Total assistance   Toileting- Clothing Manipulation and Hygiene: Maximal  assistance;Sitting/lateral lean         General ADL Comments: Pt is very motivated      Estate agent?: No apparent visual deficits   Perception     Praxis      Pertinent Vitals/Pain Pain Assessment: No/denies pain     Hand Dominance Right   Extremity/Trunk Assessment Upper Extremity Assessment Upper Extremity Assessment: Generalized weakness   Lower Extremity Assessment Lower Extremity Assessment: Defer to PT evaluation   Cervical / Trunk Assessment Cervical / Trunk Assessment: Normal   Communication Communication Communication: No difficulties   Cognition Arousal/Alertness: Awake/alert Behavior During Therapy: WFL for tasks assessed/performed Overall Cognitive Status: Within Functional Limits for tasks assessed                     General Comments       Exercises Exercises: Other exercises Other Exercises Other Exercises: Pt performed 2 sets 10 reps shoulder horizontal abduction, and diagonals with orange theraband while seated EOB and rest break between each set   Shoulder Instructions      Home Living Family/patient expects to be discharged to:: Inpatient rehab                                        Prior Functioning/Environment Level of Independence:  Needs assistance  Gait / Transfers Assistance Needed: Pt reports he mostly performed stand pivot transfers  to w/c PTA due to pain in Lt LE.  Prior to pain he reports he was ambulatory  ADL's / Homemaking Assistance Needed: Wife assists pt to get down into jetted tub and washes pt's back, wife performs all housekeeping and meal prep        OT Diagnosis: Generalized weakness   OT Problem List: Decreased strength;Decreased activity tolerance;Impaired balance (sitting and/or standing);Decreased safety awareness;Decreased knowledge of use of DME or AE;Decreased knowledge of precautions   OT Treatment/Interventions: Self-care/ADL training;Therapeutic exercise;DME and/or  AE instruction;Therapeutic activities;Patient/family education;Balance training    OT Goals(Current goals can be found in the care plan section) Acute Rehab OT Goals Patient Stated Goal: to regain independence  OT Goal Formulation: With patient Time For Goal Achievement: 11/06/15 Potential to Achieve Goals: Good ADL Goals Pt Will Perform Upper Body Bathing: with set-up;sitting Pt Will Perform Lower Body Bathing: with supervision;with set-up;sitting/lateral leans Pt Will Perform Upper Body Dressing: with set-up;sitting Pt Will Perform Lower Body Dressing: with min guard assist;sit to/from stand;sitting/lateral leans Pt Will Transfer to Toilet: with min assist;stand pivot transfer;squat pivot transfer;bedside commode  OT Frequency: Min 2X/week   Barriers to D/C:            Co-evaluation              End of Session Nurse Communication: Mobility status  Activity Tolerance: Patient tolerated treatment well Patient left: in bed;with call bell/phone within reach;with bed alarm set   Time: [id number] OT Time Calculation (min): 24 min Charges:  OT General Charges $OT Visit: 1 Procedure OT Evaluation $OT Eval Moderate Complexity: 1 Procedure OT Treatments $Therapeutic Exercise: 8-22 mins G-Codes:    Andretta Ergle M Nov 12, 2015, 10:25 AM

## 2015-10-23 NOTE — Progress Notes (Signed)
Social Work Discharge Note  The overall goal for the admission was met for:   Discharge location: No - pt had to transfer back to acute unexpectedly  Length of Stay: No - due to transfer to acute hospital  Discharge activity level: No - did not have time to complete Rehab course  Home/community participation: No  Services provided included: MD, RD, PT, OT, RN, Pharmacy and [place]: Private Insurance: [employer] [employer]  Follow-up services arranged: Other: This has not yet been arranged.  CSW to arrange [facility] for f/u therapies prior to pt's d/c.  Comments (or additional information):  Pt plans to return home to his wife, children, and niece who will assist pt at home.  At this time, CIR would like for pt to return to finish his rehab and to complete family education, pending pt's willingness to return and insurance approval, per discussion with medical and therapy teams.  Await evaluations, discussion with pt/family, and insurance decision.  CSW will assist pt/family with d/c planning upon return to CIR.  Pt should have all needed DME, unless a slide board is deisred/recommended.  CSW to assist with that, as well.  Patient/Family verbalized understanding of follow-up arrangements: No - not done yet due to unexpected transfer  Individual responsible for coordination of the follow-up plan: pt and his family  Confirmed correct DME delivered: Trey Sailors 10/23/2015    Randy [name], Randy [name]

## 2015-10-23 NOTE — Progress Notes (Signed)
Randy [name] was transferred to acute services 10/21/15 and returned to CIR on 10/23/2015 meeting the criteria for an interrupted stay (off the unit less than 3 days).  Please reference records in [phone] as well as [phone] for full documentation of this stay.  Billing will be notified at discharge to combine claims for one stay 10/12/15 through discharge with a 2 day interruption.

## 2015-10-23 NOTE — Progress Notes (Signed)
Patient ID: Randy [name], male   DOB: [birth year], 82 y.o.   MRN: [id number] Patient admitted to 4M05 via bed, escorted by nursing staff and family.  Patient verbalized understanding of rehab process, as was just here days ago.  Appears to be in no immediate distress at this time.  Brita Romp, RN

## 2015-10-23 NOTE — Progress Notes (Signed)
Back to room from HD via pt bed. Denies pain, Chest pain, SOB or other concerns. Left FA AVF with bandaid at puncture sites benign s/s bleed or other complications at site. Bruit and thrill noted. Plan to d/c to IP rehab pending acceptance, possibly this evening. Verbalized understanding. Instructed to call for needs. Verbalized understanding. No questions or concerns expressed.

## 2015-10-23 NOTE — Discharge Summary (Signed)
Physician Discharge Summary  Randy [name] [id number] DOB: June 25, 1932 DOA: 10/21/2015  PCP: [name], MD  Admit date: 10/21/2015 Discharge date: 10/23/2015  Time spent: 40 minutes  Recommendations for Outpatient Follow-up:  1. Reassess BP and adjust antihypertensive regimen as needed   Discharge Diagnoses:  Principal Problem:   Chest pain Active Problems:   Anemia of chronic disease   Elevated troponin   ESRD (end stage renal disease) on dialysis (HCC)   CAD (coronary artery disease)   Chronic combined systolic and diastolic congestive heart failure (HCC)   Benign essential HTN   Discharge Condition: stable and improved. Will discharge back to inpatient rehab. No CP, SOB or any other acute complaints at discharge.  Diet recommendation: heart healthy diet   Filed Weights   10/22/15 0425 10/23/15 0425 10/23/15 1142  Weight: 58.8 kg (129 lb 10.1 oz) 60 kg (132 lb 4.4 oz) 63 kg (138 lb 14.2 oz)    History of present illness:  82 y.o. year-old male with history of ESRD on MWF HD, CAD with stable angina, HLD, HTN, PVD s/p right BKA who was admitted for AKA secondary to gangrene on 10/09/2105. He underwent left above-the-knee amputation on 10/10/2015 by Dr Doren Custard. He had a small amount of postoperative bleeding postprocedure but otherwise no other Locations. He was transitioned to inpatient rehabilitation for aggressive physical occupational therapy where he has been since 1/19. The patient has a known history of chest pains when his blood pressure is high and is followed by cardiology. He has almost daily morning chest pains when his blood pressure is elevated and then over the course of the day his blood pressure comes down in his chest pains tend to improve. His blood pressures dropped during hemodialysis and so cardiology and his specialists have been hesitant to increase his blood pressure medications very much. His last cardiac catheterization was in November 2015 which  demonstrated nonobstructive CAD with 40-50% proximal LAD stenosis, 30% mid LAD stenosis at D2, 80% proximal stenosis of both divisions of the ramus however the left circumflex only had luminal irregularities. RCA had up to 40% proximal and mid vessel stenosis. This is medically managed. He was admitted in November 2016 with chest pains and had a mild elevation of his troponin up to 1.09 which was again medically managed. This morning, he developed sudden onset upper chest burning 10/10 that did not radiate to the shoulders, arms, neck, or jaw. He had some associated diaphoresis, lightheadedness, and SOB similar to previous episodes at home associated with high blood pressure. NTG improved his pain. His blood pressure was as high as 203/89. Incidentally, he has also had some increased SOB for the last two days and a mildly elevated temperature to 99.39F without cough.  Hospital Course:  Chest pain in patient with known CAD; mild elevation of troponin (but with ESRD) and heart score of 5 -continue Telemetry monitoring (no ischemic changes appreciated so far; EKG also stable) -troponin mildly elevated; but difficult to assess validity in setting of ESRD -per cardiology, most likely demand ischemia due to uncontrolled HTN -no further inpatient work up anticipated -Continue ASA, Beta blocker and statins -CXR without infiltrate, no leukocytosis and no fever. Will recommend monitoring off abx's -will use PPI for potential GERD/pill induce esophagitis (was recently on doxycycline)  PVD with recent gangrene of the left foot s/p left AKA on 1/17 by Dr. Scot Dock -Surgical site appears to be healing well -Continue PT/OT -continue PRN pain meds -per vascular surgery staples removal in  approx 2 weeks  Hypertension, elevated on admisison -Appears euvolemic -will discharge on Amlodipine Imdur, hydralazine, lisinopril and Coreg -given low EF will try to avoid use of clonidine -recommend close follow up to  his BP and further adjustments as needed   Chronic systolic and diastolic CHF with EF [id number] on ECHO from 07/2015 (echo also demonstarted severe hypokinesis of the basal-midinferolateral and inferior myocardium).  -compensated  -continue HD for volume management  -follow daily weights and low sodium diet   Moderate AS Mean gradient (S): 16 mm Hg. VTI ratio of LVOT to aortic valve: 0.4. Valve area (VTI): 1.24 cm^2. Valve area (Vmax): 1.21 cm^2. -currently stable -continue outpatient follow up with cardiology service   ESRD with HD MWF,  -Appreciate nephrology assistance -continue HD therapy   Anemia of renal parenchymal disease -Occult stool negative  -Epogen and IV iron as per nephrology discretion  GERD -will use PPI  Procedures:  Inpatient HD  Consultations:  Renal service  Vascular surgery  Cardiology service   Discharge Exam: Filed Vitals:   10/23/15 1430 10/23/15 1445  BP: 153/81 169/81  Pulse: 77 75  Temp:    Resp:      General: Afebrile, no CP currently and denies SOB and orthopnea.   Cardiovascular: S1 and S2, no rubs or gallops  Respiratory: no crackles, no wheezing   Abdomen: soft, NT, ND, positive BS  Musculoskeletal: left AKA (stump healing properly); right BKA, no edema or cyanosis   Discharge Instructions   Discharge Instructions    Diet - low sodium heart healthy    Complete by:  As directed      Discharge instructions    Complete by:  As directed   Daily weight Heart healthy diet Reassess BP medications and adjusted further if need for better control Rehabilitation and conditioning as per inpatient program protocol          Current Discharge Medication List    START taking these medications   Details  pantoprazole (PROTONIX) 40 MG tablet Take 1 tablet (40 mg total) by mouth daily at 12 noon.      CONTINUE these medications which have CHANGED   Details  amLODipine (NORVASC) 10 MG tablet Take 1 tablet (10 mg total) by  mouth daily.    gabapentin (NEURONTIN) 100 MG capsule Take 1 capsule (100 mg total) by mouth at bedtime.      CONTINUE these medications which have NOT CHANGED   Details  aspirin 81 MG tablet Take 81 mg by mouth daily.    atorvastatin (LIPITOR) 40 MG tablet Take 1 tablet (40 mg total) by mouth daily at 6 PM. Qty: 30 tablet, Refills: 1    calcitRIOL (ROCALTROL) 0.5 MCG capsule Take 1 capsule (0.5 mcg total) by mouth daily. Qty: 30 capsule, Refills: 3    carvedilol (COREG) 25 MG tablet Take 1 tablet (25 mg total) by mouth 2 (two) times daily with a meal. Qty: 60 tablet, Refills: 1    feeding supplement, RESOURCE BREEZE, (RESOURCE BREEZE) LIQD Take 1 Container by mouth 3 (three) times daily between meals. Qty: 90 Container, Refills: 5    hydrALAZINE (APRESOLINE) 25 MG tablet Take 1 tablet (25 mg total) by mouth 3 (three) times daily. Qty: 90 tablet, Refills: 3    isosorbide mononitrate (IMDUR) 30 MG 24 hr tablet Take 0.5 tablets (15 mg total) by mouth at bedtime. Qty: 45 tablet, Refills: 3    lidocaine-prilocaine (EMLA) cream Apply 1 application topically as needed. For dialysis Refills: 10  lisinopril (PRINIVIL,ZESTRIL) 10 MG tablet Take 1 tablet by mouth daily. Refills: 2    LUMIGAN 0.01 % SOLN Place 1 drop into both eyes at bedtime. Refills: 11    multivitamin (RENA-VIT) TABS tablet Take 1 tablet by mouth daily.    sevelamer carbonate (RENVELA) 800 MG tablet Take 1,600-2,400 mg by mouth See admin instructions. Take 3 tablets (2400 mg) with meals and 2 tablets (1600 mg) with snacks    SIMBRINZA 1-0.2 % SUSP Place 1 drop into both eyes 2 (two) times daily. Refills: 11      STOP taking these medications     cloNIDine (CATAPRES) 0.2 MG tablet        No Known Allergies Follow-up Information    Follow up with [name], MD. Schedule an appointment as soon as possible for a visit in 10 days.   Specialty:  Internal Medicine   Why:  after discharge   Contact  information:   [place] [address] [phone]       Follow up with [name], MD.   Specialties:  Vascular Surgery, Cardiology   Why:  office will set up follow up appointment and staples removal (approx in 2 weeks)   Contact information:   [address] [place] [address] [phone]        The results of significant diagnostics from this hospitalization (including imaging, microbiology, ancillary and laboratory) are listed below for reference.    Significant Diagnostic Studies: Dg Wrist Complete Right  10/20/2015  CLINICAL DATA:  Acute right wrist pain without known injury. EXAM: RIGHT WRIST - COMPLETE 3+ VIEW COMPARISON:  July 10, 2012. FINDINGS: There is no evidence of fracture or dislocation. Vascular calcifications are noted. Narrowing and sclerosis of the first carpometacarpal joint as well as several intercarpal joints is noted. IMPRESSION: Findings consistent with osteoarthritis involving the first carpometacarpal and several intercarpal joints. No acute abnormality seen in the right wrist. Electronically Signed   By: Marijo Conception, M.D.   On: 10/20/2015 09:39   Dg Chest Port 1 View  10/21/2015  CLINICAL DATA:  Dyspnea and mid chest pain EXAM: PORTABLE CHEST 1 VIEW COMPARISON:  08/04/2015; 03/13/2015; 10/20/2014; 09/22/2014; 03/28/2014; 10/08/2012 FINDINGS: Grossly unchanged enlarged cardiac silhouette and mediastinal contours with atherosclerotic plaque within the thoracic aorta. Grossly unchanged small left-sided effusion with associated left basilar/retrocardiac heterogeneous/consolidative opacities. No new focal airspace opacities. Chronic pulmonary venous congestion without frank evidence of edema. No pneumothorax. Unchanged bones. IMPRESSION: 1. Stable examination without definite superimposed acute cardiopulmonary disease 2. Similar findings of cardiomegaly, pulmonary venous congestion, chronic left-sided effusion and associated left  basilar opacities, likely atelectasis or scar. Electronically Signed   By: Sandi Mariscal M.D.   On: 10/21/2015 09:56   Dg Foot Complete Left  10/02/2015  CLINICAL DATA:  Left foot pain.  Swelling and wound. EXAM: LEFT FOOT - COMPLETE 3+ VIEW COMPARISON:  Radiographs 12/17/2005 FINDINGS: Site of clinical wound not well seen radiographically. No tracking soft tissue air or radiopaque foreign body. There are hammertoe deformity of the digits. No bony destructive change. No fracture or dislocation. Plantar calcaneal spur. Vascular calcifications are seen. Dorsal soft tissue edema. IMPRESSION: No radiographic evidence of osteomyelitis. Dorsal soft tissue edema. Electronically Signed   By: Jeb Levering M.D.   On: 10/02/2015 22:26   Labs: Basic Metabolic Panel:  Recent Labs Lab 10/16/15 1935 10/18/15 1228 10/19/15 0201 10/20/15 1600 10/22/15 0542 10/23/15 1200  NA 128* 134* 131* 135 134* 135  K 6.1*  5.2* 5.6* 4.8 4.0 4.8  CL 90* 94* 91* 94* 101 92*  CO2 22 26 24 28 24 27   GLUCOSE 117* 124* 86 162* 277* 114*  BUN 101* 72* 80* 57* 14 78*  CREATININE 11.74* 8.90* 9.78* 7.60* 1.00 9.78*  CALCIUM 9.0 9.2 8.6* 8.9 8.4* 9.1  PHOS 5.8*  --  6.9* 5.8*  --  5.8*   Liver Function Tests:  Recent Labs Lab 10/16/15 1935 10/19/15 0201 10/20/15 1600 10/23/15 1200  ALBUMIN 2.3* 1.9* 1.9* 2.0*   CBC:  Recent Labs Lab 10/19/15 0201 10/20/15 0905 10/22/15 0728 10/23/15 1200  WBC 5.9 5.2 5.5 5.6  NEUTROABS  --  3.8  --   --   HGB 8.6* 9.3* 8.7* 8.8*  HCT 28.8* 30.5* 29.1* 28.1*  MCV 96.0 97.8 98.0 96.2  PLT 218 254 267 291   Cardiac Enzymes:  Recent Labs Lab 10/21/15 0936 10/21/15 1350 10/21/15 1800 10/21/15 2300  TROPONINI 0.10* 0.24* 0.31* 0.29*   BNP: BNP (last 3 results)  Recent Labs  01/08/15 1118 08/04/15 0625 10/21/15 0936  BNP >4500.0* 2452.0* 3090.9*    Signed:  Barton Dubois MD.  [facility] 10/23/2015, 3:04 PM

## 2015-10-23 NOTE — Plan of Care (Signed)
Problem: Phase I Progression Outcomes Goal: Hemodynamically stable Outcome: Progressing Monitor VS Routine per Unit  Problem: Phase II Progression Outcomes Goal: Anginal pain relieved Outcome: Progressing Assess Pain Routine with VS and PRN  Problem: Discharge Progression Outcomes Goal: Activity appropriate for discharge plan Outcome: Progressing Assess activity tolerance and needs for assistance.

## 2015-10-23 NOTE — Interval H&P Note (Signed)
Randy [name] was admitted today to Inpatient Rehabilitation with the diagnosis of left AKA.  [name] history has been reviewed, patient examined, and there is no change in status.  Patient continues to be appropriate for intensive inpatient rehabilitation.  I have reviewed the patient's chart and labs.  Questions were answered to the patient's satisfaction. [name] has been reviewed and assessment remains appropriate.  Randy [name] 10/23/2015, 10:59 PM

## 2015-10-23 NOTE — Progress Notes (Signed)
Randy Lorie Phenix, MD Physician Signed Physical Medicine and Rehabilitation Consult Note 10/11/2015 8:19 AM  Related encounter: Admission (Discharged) from 10/10/2015 in [place] Collapse All        Physical Medicine and Rehabilitation Consult  Reason for Consult: L-AKA due to PVD and h/o R-BKA Referring Physician: Dr. Donnetta Hutching.   HPI: Randy [name] is a 82 y.o. 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 Patient elected to undergo L-AKA on 10/10/15 by Dr. Donnetta Hutching. Post op has had bleeding from stump requiring dressing changes. PT/OT evaluations to be done today and CIR consulted in anticipation of extensive rehab needs.  Patient was independent at wheelchair level with right prosthesis. He was able to perform stand pivot transfers independently and occasionally able to take a couple of steps. Wife supportive and can provide supervision after discharge.   Review of Systems  HENT: Negative for hearing loss.  Eyes: Negative for blurred vision and double vision.  Respiratory: Positive for shortness of breath (some with activity). Negative for cough.  Cardiovascular: Negative for palpitations.  Gastrointestinal: Positive for heartburn and constipation. Negative for nausea and abdominal pain.  Musculoskeletal: Positive for myalgias. Negative for joint pain and falls.   Spasms with HD  Skin: Positive for itching (occasionally). Negative for rash.  Neurological: Positive for sensory change (still has occasional phantom pain right residual limb). Negative for dizziness, focal weakness and headaches.  Psychiatric/Behavioral: Negative for depression. The patient does not have insomnia.  All other systems reviewed and are negative.    Past Medical History  Diagnosis Date  . Hypercholesterolemia    . Gout   . Nonischemic cardiomyopathy (HCC)     LVEF 35-40%  . Anemia of chronic disease   . Essential hypertension   . Arthritis   . Peripheral vascular disease ([place])     Status post right below knee amputation for a nonhealing wound of the right foot 12/2014  . History of pneumonia 2014  . CAD (coronary artery disease)     Moderate multivessel disease 07/2015 - managed medically  . History of myopericarditis 2015    07/2015  . Stroke [facility])     "they said I did" "I did not know anything about it"  . ESRD on hemodialysis [facility])     M/W/F in [place] - Dr. Florene Glen  . Constipation   . History of blood transfusion   . Pneumonia   . Acute myopericarditis 2015    Past Surgical History  Procedure Laterality Date  . Knee arthroscopy Right 2007  . Back surgery    . Hemorrhoid surgery  1970's  . Ganglion cyst excision  01/03/2012    Procedure: REMOVAL GANGLION OF WRIST; Surgeon: Scherry Ran, MD; Location: [facility]; Service: General; Laterality: Right;  . Colonoscopy    . Av fistula placement  08/24/2012    Procedure: ARTERIOVENOUS (AV) FISTULA CREATION; Surgeon: Rosetta Posner, MD; Location: [place]; Service: Vascular; Laterality: Left;  . Insertion of dialysis catheter Right     neck  . Insertion of dialysis catheter  10/19/2012    Procedure: INSERTION OF DIALYSIS CATHETER; Surgeon: Rosetta Posner, MD; Location: [place]; Service: Vascular; Laterality: N/A; REMOVE TEMPORARY CATH  . Cholecystectomy    . Lumbar disc surgery  2004  . Left heart catheterization with coronary angiogram N/A 08/17/2014  Procedure: LEFT HEART CATHETERIZATION WITH CORONARY ANGIOGRAM; Surgeon: Larey Dresser, MD; Location: [facility] CATH LAB; Service: Cardiovascular; Laterality: N/A;  . Colonoscopy Left 09/26/2014    Procedure: COLONOSCOPY; Surgeon: Arta Silence,  MD; Location: [facility] ENDOSCOPY; Service: Endoscopy; Laterality: Left;  . Amputation Right 01/05/2015    Procedure: AMPUTATION BELOW KNEE; Surgeon: Angelia Mould, MD; Location: [place]; Service: Vascular; Laterality: Right;  . Capd removal N/A 03/13/2015    Procedure: CONTINUOUS AMBULATORY PERITONEAL DIALYSIS (CAPD) CATHETER REMOVAL; Surgeon: Coralie Keens, MD; Location: [place]; Service: General; Laterality: N/A;  . Cardiac catheterization    . Above knee leg amputation Left 10/10/2015    Family History  Problem Relation Age of Onset  . Arthritis    . Cancer    . Kidney disease    . Anesthesia problems Neg Hx   . Hypotension Neg Hx   . Malignant hyperthermia Neg Hx   . Pseudochol deficiency Neg Hx   . Cancer Sister   . Colon cancer Brother   . Colon cancer Brother      Social History: Married. 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 Wife at home can provide supervsion after discharge. Niece provides transportation to HD. He reports that he quit smoking about 17 years ago. His smoking use included Cigarettes. He has a 30 pack-year smoking history. He quit smokeless tobacco use about 21 years ago. His smokeless tobacco use included Chew. He reports that he does not drink alcohol or use illicit drugs.    Allergies: No Known Allergies    Medications Prior to Admission  Medication Sig Dispense Refill  . amLODipine (NORVASC) 10 MG tablet Take 1 tablet (10 mg total) by mouth daily. 30 tablet 3  . aspirin 81 MG tablet Take 81 mg by mouth daily.    Marland Kitchen atorvastatin (LIPITOR) 40 MG tablet Take 1 tablet (40 mg total) by mouth daily at 6 PM. 30 tablet 1  . calcitRIOL (ROCALTROL) 0.5 MCG capsule Take 1 capsule (0.5 mcg total) by mouth daily. 30 capsule 3  . carvedilol (COREG) 25 MG tablet Take 1 tablet (25 mg total) by mouth 2 (two) times daily with a meal. 60 tablet 1  .  cloNIDine (CATAPRES) 0.2 MG tablet Take 0.2 mg by mouth 2 (two) times daily as needed (blood pressure >130/27).   10  . doxycycline (VIBRA-TABS) 100 MG tablet Take 1 tablet (100 mg total) by mouth every 12 (twelve) hours. 14 tablet 0  . feeding supplement, RESOURCE BREEZE, (RESOURCE BREEZE) LIQD Take 1 Container by mouth 3 (three) times daily between meals. 90 Container 5  . gabapentin (NEURONTIN) 100 MG capsule Take 1 capsule (100 mg total) by mouth 3 (three) times daily. (Patient taking differently: Take 100 mg by mouth at bedtime. ) 60 capsule 0  . hydrALAZINE (APRESOLINE) 25 MG tablet Take 1 tablet (25 mg total) by mouth 3 (three) times daily. (Patient taking differently: Take 25 mg by mouth 3 (three) times daily as needed (High blood pressure). ) 90 tablet 3  . isosorbide mononitrate (IMDUR) 30 MG 24 hr tablet Take 0.5 tablets (15 mg total) by mouth at bedtime. 45 tablet 3  . lisinopril (PRINIVIL,ZESTRIL) 10 MG tablet Take 1 tablet by mouth daily.  2  . LUMIGAN 0.01 % SOLN Place 1 drop into both eyes at bedtime.  11  . multivitamin (RENA-VIT) TABS tablet Take 1 tablet by mouth daily.    . Oxycodone HCl 10 MG TABS Take 10 mg by mouth every 6 (  six) hours as needed (for pain).   0  . oxyCODONE-acetaminophen (PERCOCET/ROXICET) 5-325 MG per tablet Take 1 tablet by mouth every 6 (six) hours as needed for severe pain. 20 tablet 0  . sevelamer carbonate (RENVELA) 800 MG tablet Take 1,600-2,400 mg by mouth See admin instructions. Take 3 tablets (2400 mg) with meals and 2 tablets (1600 mg) with snacks    . SIMBRINZA 1-0.2 % SUSP Place 1 drop into both eyes 2 (two) times daily.  11  . predniSONE (DELTASONE) 20 MG tablet Take 2 tablets by mouth daily X 2 days; then 1 tablet by mouth daily X 2 days; then 1/2 tablet by mouth daily X 3 days and stop prednisone 9 tablet 0    Home: Home Living Family/patient expects to be discharged to::  Unsure Living Arrangements: Spouse/significant other  Functional History:   Functional Status:  Mobility:          ADL:    Cognition: Cognition Orientation Level: Oriented X4      Blood pressure 128/65, pulse 79, temperature 98.8 F (37.1 C), temperature source Oral, resp. rate 15, height 5\' 9"  (1.753 m), weight 59.5 kg (131 lb 2.8 oz), SpO2 100 %. Physical Exam  Nursing note and vitals reviewed. Constitutional: He is oriented to person, place, and time. He appears well-developed and well-nourished. Nasal cannula in place.  Pleasant elderly male in bed undergoing HD.  HENT:  Head: Normocephalic and atraumatic.  Eyes: Conjunctivae and EOM are normal. Pupils are equal, round, and reactive to light.  Neck: Normal range of motion. Neck supple.  Cardiovascular: Normal rate and regular rhythm.  Murmur heard. Respiratory: Effort normal and breath sounds normal. No respiratory distress. He has no wheezes.  GI: Soft. Bowel sounds are normal. He exhibits no distension.  Musculoskeletal: He exhibits tenderness. He exhibits no edema.  Right BKA incision well healed and intact.  Dry scab from healed ulcer on right thigh.  L-AKA with compressive dressing--clean and dry. Minimally tender to touch.  Neurological: He is alert and oriented to person, place, and time.  Speech clear.  Follows basic commands without difficulty.  B/l UE 5/5 Right hip flexion 5/5 Left hip flexion with pain inhibition  Skin: Skin is warm and dry. No rash noted. No erythema.  Left AKA with dressing c/d/i  Psychiatric: He has a normal mood and affect. His behavior is normal. Thought content normal.     Lab Results Last 24 Hours    Results for orders placed or performed during the hospital encounter of 10/10/15 (from the past 24 hour(s))  Basic metabolic panel Status: Abnormal   Collection Time: 10/11/15 3:29 AM  Result Value Ref Range   Sodium 138 135 - 145 mmol/L    Potassium 4.9 3.5 - 5.1 mmol/L   Chloride 97 (L) 101 - 111 mmol/L   CO2 26 22 - 32 mmol/L   Glucose, Bld 79 65 - 99 mg/dL   BUN 53 (H) 6 - 20 mg/dL   Creatinine, Ser 9.88 (H) 0.61 - 1.24 mg/dL   Calcium 8.5 (L) 8.9 - 10.3 mg/dL   GFR calc non Af Amer 4 (L) >60 mL/min   GFR calc Af Amer 5 (L) >60 mL/min   Anion gap 15 5 - 15  CBC Status: Abnormal   Collection Time: 10/11/15 3:29 AM  Result Value Ref Range   WBC 8.2 4.0 - 10.5 K/uL   RBC 3.60 (L) 4.22 - 5.81 MIL/uL   Hemoglobin 10.7 (L) 13.0 - 17.0 g/dL  HCT 35.5 (L) 39.0 - 52.0 %   MCV 98.6 78.0 - 100.0 fL   MCH 29.7 26.0 - 34.0 pg   MCHC 30.1 30.0 - 36.0 g/dL   RDW 18.6 (H) 11.5 - 15.5 %   Platelets 242 150 - 400 K/uL      Imaging Results (Last 48 hours)    No results found.    Assessment/Plan: Diagnosis: L-AKA due to PVD and h/o R-BKA Labs and images independently reviewed. Records reviewed and summated above. PT/OT for mobility, ADL's, strengthening,and wheelchair training Clean amputation daily with soap and water Monitor incision site for signs of infection or impending skin breakdown. Staples to remain in place for 3-4 weeks Stump shrinker, for edema control  Scar mobilization massaging to prevent soft tissue adherence Stump protector during therapies Prevent flexion contractures by implementing the following:  Encourage prone lying for 20-30 mins per day BID to avoid hip flexion Contractures if medically appropriate; Avoid prolonged sitting Post surgical pain control with oral medication Phantom limb pain control with physical modalities including desensitization techniques (gentle self massage to the residual stump,hot packs if sensation iintact, [place]) and mirror therapy, TENS. If ineffective, consider pharmacological treatment for neuropathic pain (e.g gabapentin, pregabalin, amytriptalyine,  duloxetine).   1. Does the need for close, 24 hr/day medical supervision in concert with the patient's rehab needs make it unreasonable for this patient to be served in a less intensive setting? Yes 2. Co-Morbidities requiring supervision/potential complications: NICM with combined chronic systolic/diastolic HF (Monitor in accordance with increased physical activity and avoid UE resistance excercises) PVD (cont meds), ESRD (cont recs per [facility]), hx of R-BKA 12/2014 with diffuse atherosclerosis LLE, HTN (monitor and provide prns in accordance with increased physical exertion and pain), anemia of chronic disease (transfuse if necessary to ensure appropriate perfusion for increased activity tolerance), post-op pain (Biofeedback training with therapies to help reduce reliance on opiate pain medications, monitor pain control during therapies, and sedation at rest and titrate to maximum efficacy to ensure participation and gains in therapies) 3. Due to bladder management, safety, skin/wound care, disease management, medication administration, pain management and patient education, does the patient require 24 hr/day rehab nursing? Yes 4. Does the patient require coordinated care of a physician, rehab nurse, PT (1-2 hrs/day, 5 days/week) and OT (1-2 hrs/day, 85 days/week) to address physical and functional deficits in the context of the above medical diagnosis(es)? Yes Addressing deficits in the following areas: balance, endurance, locomotion, strength, transferring, bowel/bladder control, bathing, dressing, toileting and psychosocial support 5. Can the patient actively participate in an intensive therapy program of at least 3 hrs of therapy per day at least 5 days per week? Potentially 6. The potential for patient to make measurable gains while on inpatient rehab is excellent 7. Anticipated functional outcomes upon discharge from inpatient rehab are TBD with PT, TBD with OT, N/A with SLP. 8. Estimated rehab  length of stay to reach the above functional goals is: TBD. 9. Does the patient have adequate social supports and living environment to accommodate these discharge functional goals? Yes 10. Anticipated D/C setting: Home 11. Anticipated post D/C treatments: HH therapy and Home excercise program 12. Overall Rehab/Functional Prognosis: good  RECOMMENDATIONS: This patient's condition is appropriate for continued rehabilitative care in the following setting: CIR if pt willing and able to tolerate 3 hours therapy/day without IV pain meds. Patient has agreed to participate in recommended program. Potentially Note that insurance prior authorization may be required for reimbursement for recommended care.  Comment:  Rehab Admissions Coordinator to follow up.  Delice Lesch, MD 10/11/2015       Revision History     Date/Time User Provider Type Action   10/11/2015 2:50 PM Randy Lorie Phenix, MD Physician Sign   10/11/2015 8:53 AM Bary Leriche, PA-C Physician Assistant Share   10/11/2015 8:46 AM Bary Leriche, PA-C Physician Assistant Share   View Details Report       Routing History     Date/Time From To Method   10/11/2015 2:50 PM Randy Lorie Phenix, MD Randy Lorie Phenix, MD In [facility]   10/11/2015 2:50 PM Randy Lorie Phenix, MD Lucia Gaskins, MD Fax

## 2015-10-24 ENCOUNTER — Telehealth: Payer: Self-pay | Admitting: Vascular Surgery

## 2015-10-24 ENCOUNTER — Inpatient Hospital Stay (HOSPITAL_COMMUNITY): Payer: Medicare Other | Admitting: Occupational Therapy

## 2015-10-24 ENCOUNTER — Inpatient Hospital Stay (HOSPITAL_COMMUNITY): Payer: Medicare Other | Admitting: Physical Therapy

## 2015-10-24 ENCOUNTER — Inpatient Hospital Stay (HOSPITAL_COMMUNITY): Payer: Medicare Other

## 2015-10-24 DIAGNOSIS — K5901 Slow transit constipation: Secondary | ICD-10-CM

## 2015-10-24 DIAGNOSIS — I10 Essential (primary) hypertension: Secondary | ICD-10-CM

## 2015-10-24 MED ORDER — SENNOSIDES-DOCUSATE SODIUM 8.6-50 MG PO TABS
1.0000 | ORAL_TABLET | Freq: Every day | ORAL | Status: DC
  Administered 2015-10-24: 1 via ORAL
  Filled 2015-10-24: qty 1

## 2015-10-24 MED ORDER — SORBITOL 70 % SOLN
45.0000 mL | Freq: Once | Status: AC
  Administered 2015-10-24: 45 mL via ORAL
  Filled 2015-10-24: qty 60

## 2015-10-24 NOTE — Progress Notes (Signed)
Physical Therapy Session Note  Patient Details  Name: Randy [name] MRN: [id number] Date of Birth: 1932/05/26  Today's Date: 10/24/2015 PT Individual Time: [id number] PT Individual Time Calculation (min): 20 min   Short Term Goals: Week 1:  PT Short Term Goal 1 (Week 1): pt will perform functional transfers with min A PT Short Term Goal 2 (Week 1): pt will demo dyanmic sitting balance for functional task with min A Week 2:  PT Short Term Goal 1 (Week 2): Last weekly note performed on 10/20/15-STG now = LTG due to LOS after interruption of stay  Skilled Therapeutic Interventions/Progress Updates:  Balance/vestibular training;Discharge planning;Disease management/prevention;DME/adaptive equipment instruction;Functional mobility training;Pain management;Patient/family education;Neuromuscular re-education;Psychosocial support;Skin care/wound management;Splinting/orthotics;Therapeutic Activities;Therapeutic Exercise;UE/LE Strength taining/ROM;Wheelchair propulsion/positioning    Pt seen today due to interruption in stay.  Patient was admitted to CIR 10/12/15-10/21/15 s/p L AKA and was participating in his therapies. However, 10/21/15 he developed chest pain and his stay was interrupted due to a transfer back to acute care. Cardiology consulted and found that chest pain was likely due to demand ischemia in setting of severe HTN and probable mild fluid overload. Now improved with BP control. Troponin not significantly elevated. Medication was adjusted and new guidelines were provided. PT/OT re-evaluations continue to recommend CIR and patient would like to complete his stay as well as family education prior to going home. Plan to re-admit to CIR 10/23/15.  Pt received in bed with nephew present; pt denying pain but reporting significant fatigue.  Pt also reporting a need to have a BM.  Pt set up with BSC.  Pt performed supine > sit with +2 to come to long sitting position.  Performed posterior transfer bed >  BSC with mod A overall for balance during weight shifting and scooting and assistance for lateral leans to doff shorts.  Pt required prolonged time on BSC due to constipation.  Returned to room with pt back in bed in sidelying.  Pt reporting suppository was given and still had not moved his bowels.  Pt reporting abdominal discomfort and fatigue.  Provided education to pt regarding changes to LOS due to interruption in stay, focus of continued therapy and goals.  Pt verbalized agreement.  Pt left in bed in sidelying with all items within reach.     Therapy Documentation Precautions:  Precautions Precautions: Fall Precaution Comments: recent Lt AKA, h/o Rt BKA Restrictions Weight Bearing Restrictions: Yes LLE Weight Bearing: Non weight bearing General: PT Amount of Missed Time (min): 55 Minutes PT Missed Treatment Reason: Toileting;Nursing care Vital Signs: Therapy Vitals Temp: 98.1 F (36.7 C) Temp Source: Oral Pulse Rate: 85 Resp: 16 BP: (!) 165/77 mmHg Patient Position (if appropriate): Sitting Oxygen Therapy SpO2: 98 % O2 Device: Not Delivered  See Function Navigator for Current Functional Status.   Therapy/Group: Individual Therapy  [name] [facility] 10/24/2015, 3:54 PM

## 2015-10-24 NOTE — Progress Notes (Signed)
Occupational Therapy Session Note  Patient Details  Name: Randy [name] MRN: [id number] Date of Birth: 1932-08-10  Today's Date: 10/24/2015 OT Individual Time: 0700-0800 OT Individual Time Calculation (min): 60 min    Short Term Goals: Week 2:  OT Short Term Goal 1 (Week 2): Continue week 1 goals secondary to interrupted stay  Skilled Therapeutic Interventions/Progress Updates:  Balance/vestibular training;DME/adaptive equipment instruction;Functional electrical stimulation;Functional mobility training;Patient/family education;Self Care/advanced ADL retraining;Therapeutic Activities;Therapeutic Exercise;UE/LE Strength taining/ROM;Wheelchair propulsion/positioning   Pt seen today due to interruption in stay. Patient was admitted to CIR 10/12/15-10/21/15 s/p L AKA and was participating in his therapies. However, 10/21/15 he developed chest pain and his stay was interrupted due to a transfer back to acute care. Cardiology consulted and found that chest pain was likely due to demand ischemia in setting of severe HTN and probable mild fluid overload. Now improved with BP control. Troponin not significantly elevated. Medication was adjusted and new guidelines were provided. PT/OT re-evaluations continue to recommend CIR and patient would like to complete his stay as well as family education prior to going home. Plan to re-admit to CIR 10/23/15.  Upon entering the room, pt supine in bed with 5/10 c/o pain in L LE. Pt required coaxing for participation with therapist. Pt requesting transfer onto commode for BM. Pt performing anterior posterior transfer with mod A in order to decrease skin shearing. While sitting on BSC, pt engaged in bathing and dressing tasks with set up A and min verbal cues for task. Pt moving slightly forwards in order for therapist to perform toileting hygiene. Pt returned to bed with assist needed for lateral leans and to pull underwear over B hips. Pt needing rest breaks  Secondary to  fatigue. Pt also reporting he was feeling unwell. BP taken in supine with results of 177/73 and RN notified. While pt supine, therapist wrapped L LE stump for edema management. RN arrived with medications as OT exited the room.    Therapy Documentation Precautions:  Precautions Precautions: Fall Precaution Comments: recent Lt AKA, h/o Rt BKA Restrictions Weight Bearing Restrictions: Yes LLE Weight Bearing: Non weight bearing General: General PT Missed Treatment Reason: Toileting;Nursing care Vital Signs: Therapy Vitals Temp: 98.1 F (36.7 C) Temp Source: Oral Pulse Rate: 85 Resp: 16 BP: (!) 165/77 mmHg Patient Position (if appropriate): Sitting Oxygen Therapy SpO2: 98 % O2 Device: Not Delivered  See Function Navigator for Current Functional Status.   Therapy/Group: Individual Therapy  Randy [name] 10/24/2015, 4:37 PM

## 2015-10-24 NOTE — Progress Notes (Signed)
Occupational Therapy Session Note  Patient Details  Name: Randy [name] MRN: [id number] Date of Birth: 11/01/1931  Today's Date: 10/24/2015 OT Individual Time: 1100-1140 OT Individual Time Calculation (min): 40 min    Short Term Goals: Week 1:   STG=LTG d/t anticipated brief LOS  Skilled Therapeutic Interventions/Progress Updates: ADL-retraining with focus on toileting, anterior/posterior (A/P) transfer to [facility], and bed mobility.   Pt educated on A/P transfer method and he required mod assist to lift onto [facility] but only min assist to return to bed.   Pt required extra time to eliminate small BM and mod assist to perform hygiene d/t large dressing covering anus partially.    Pt able to complete bed mobility using bed rails and used rinse free body wash to perform hand hygiene but reported fatigue after toileting and transfer, declining further therapy after 40 minutes of treatment.   Pt left in bed at end of session with all needs within reach.     Therapy Documentation Precautions:  Precautions Precautions: Fall Precaution Comments: recent Lt AKA, h/o Rt BKA Restrictions Weight Bearing Restrictions: Yes LLE Weight Bearing: Non weight bearing   General: General OT Amount of Missed Time: 20 Minutes  Vital Signs: Therapy Vitals Pulse Rate: 78 BP: (!) 142/72 mmHg Patient Position (if appropriate): Lying Pain: Pain Assessment Pain Assessment: 0-10 Pain Score: 2  Pain Type: Surgical pain Pain Location: Leg Pain Orientation: Left Pain Descriptors / Indicators: Aching;Tender Pain Onset: Gradual Patients Stated Pain Goal: 3 Pain Intervention(s): Medication (See eMAR)   See Function Navigator for Current Functional Status.   Therapy/Group: Individual Therapy  Wava Kildow 10/24/2015, 11:43 AM

## 2015-10-24 NOTE — Progress Notes (Signed)
Social Work Patient ID: Randy [name], male   DOB: 27-Jan-1932, 82 y.o.   MRN: [id number]   CSW noted that pt returned to rehab on 10-23-15 and CSW will continue to follow pt.  CSW spoke with pt's dtr that day regarding [place] and her concern that he will need w/c transportation to and from dialysis.  CSW spoke with dialysis center and together we are trying to at least get pt on the waiting this for [facility] in [place].  CSW also obtained pricing for private pay w/c transport of $35 through [employer] and CSW will share this with pt/family.  CSW to continue to assist and inform pt of any change in d/c date due to interrupted stay on Rehab.

## 2015-10-24 NOTE — Progress Notes (Addendum)
[place] PHYSICAL MEDICINE & REHABILITATION     PROGRESS NOTE  Subjective/Complaints:  Pt seen sitting up this AM eating breakfast.  He is upset he did not get his scrambled eggs.  He states he feels woozy and has not had a good BM in a long time.   ROS: +Constipation.  Denies CP, SOB, n/v/d.  Objective: Vital Signs: Blood pressure 172/69, pulse 83, temperature 98.6 F (37 C), temperature source Oral, resp. rate 18, weight 60.6 kg (133 lb 9.6 oz), SpO2 97 %. No results found.  Recent Labs  10/22/15 0728 10/23/15 1200  WBC 5.5 5.6  HGB 8.7* 8.8*  HCT 29.1* 28.1*  PLT 267 291    Recent Labs  10/22/15 0542 10/23/15 1200  NA 134* 135  K 4.0 4.8  CL 101 92*  GLUCOSE 277* 114*  BUN 14 78*  CREATININE 1.00 9.78*  CALCIUM 8.4* 9.1   CBG (last 3)  No results for input(s): GLUCAP in the last 72 hours.  Wt Readings from Last 3 Encounters:  10/24/15 60.6 kg (133 lb 9.6 oz)  10/23/15 61 kg (134 lb 7.7 oz)  10/21/15 59 kg (130 lb 1.1 oz)    Physical Exam:  BP 172/69 mmHg  Pulse 83  Temp(Src) 98.6 F (37 C) (Oral)  Resp 18  Wt 60.6 kg (133 lb 9.6 oz)  SpO2 97% Constitutional: He appears well-developed and well-nourished. NAD HENT: Head: Normocephalic and atraumatic.  Mouth/Throat: Oropharynx is clear and moist.  Eyes: Conjunctivae and EOM are normal.  Cardiovascular: Normal rate and regular rhythm. Murmur heard. Respiratory: Effort normal and breath sounds normal. No stridor. No respiratory distress. He has no wheezes. He has no rales.  GI: Soft. Bowel sounds are normal. He exhibits no distension. There is no tenderness. There is no rebound.  Musculoskeletal:  Right BKA well healed no edema, non tender Left AKA with Ace wrap, dressing c/d/i. No tenderness to palpation.  Neurological: He is alert and oriented.  4+/5 in BUE and R Hip flexor, LLE 4/5 Skin: Skin is warm.  Psychiatric: He has a normal mood and affect.   Assessment/Plan: 1. Functional  deficits secondary to left AKA 10/10/15 with prior hx of R BKA which require 3+ hours per day of interdisciplinary therapy in a comprehensive inpatient rehab setting. Physiatrist is providing close team supervision and 24 hour management of active medical problems listed below. Physiatrist and rehab team continue to assess barriers to discharge/monitor patient progress toward functional and medical goals.  Function:  Bathing Bathing position      Bathing parts      Bathing assist        Upper Body Dressing/Undressing Upper body dressing                    Upper body assist        Lower Body Dressing/Undressing Lower body dressing                                  Lower body assist        Toileting Toileting          Toileting assist     Transfers Chair/bed transfer             Locomotion Ambulation           Wheelchair          Cognition Comprehension Comprehension assist level: Understands complex 90%  of the time/cues 10% of the time  Expression Expression assist level: Expresses complex 90% of the time/cues < 10% of the time  Social Interaction Social Interaction assist level: Interacts appropriately with others - No medications needed.  Problem Solving Problem solving assist level: Solves basic 90% of the time/requires cueing < 10% of the time  Memory Memory assist level: Recognizes or recalls 90% of the time/requires cueing < 10% of the time    Medical Problem List and Plan: 1. 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 DVT Prophylaxis/Anticoagulation: Pharmaceutical: Lovenox 3. Pain Management: Oxycodone IR 4. Mood: monitor for reactive depression 5. Neuropsych: This patient is capable of making decisions on his own behalf. 6. Skin/Wound Care: Dressing changes and ACE wrap to LLE 7. Fluids/Electrolytes/Nutrition: Monitor I/Os, meal intake, may need dietary consult  Eating 80-100% of  meals 8. [id number], lisinopril, coreg, with prn hydralazine  Labile at present, will cont to monitor with therapies and consider adjustment of meds if necessary 9. ESRD: HD managed by [facility] M-W F 10 Anemia of chronic disease: Aranesp weekly in HD  Labs pending today 11. NISCM/CAD:Lipitor , ASA, Coreg 12.  Constipation  Will increase bowel meds 1/31  LOS (Days) 1 A FACE TO FACE EVALUATION WAS PERFORMED  Tirza Senteno Lorie Phenix 10/24/2015 8:39 AM

## 2015-10-24 NOTE — Telephone Encounter (Addendum)
-----   Message from Angelia Mould, MD sent at 10/23/2015 10:56 AM EST ----- Regarding: f/u He needs a f/u visit in 2 weeks for staple removal. Thanks CD  notified patient of post op appt. for staple removal on 11-08-15 2:15

## 2015-10-24 NOTE — Progress Notes (Signed)
Subjective:   Now back in rehab- had HD yest- only removed 2000- BP stayed high  Objective Vital signs in last 24 hours: Filed Vitals:   10/23/15 1824 10/23/15 2041 10/24/15 0528 10/24/15 1030  BP: 138/54 148/70 172/69 142/72  Pulse: 74  83 78  Temp: 98.6 F (37 C)  98.6 F (37 C)   TempSrc: Oral  Oral   Resp: 18  18   Weight:   60.6 kg (133 lb 9.6 oz)   SpO2: 98%  97%    Weight change:  Physical Exam: General=Alert  Calm , NAD Chest = CTA bilat Card =RRR no m. r. g. Abd= soft nt nd  Ext =L AKA and R BKA,No edema HD access= pos. 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 CP- think plan is for medical management and to keep BP down - imdur/amlod/coreg 25/lisinopril 10- unclear if will tolerate all those agents but so far he is  Briceida Rasberry A   10/24/2015, 10:48 AM    Labs: Basic Metabolic Panel:  Recent Labs Lab 10/19/15 0201 10/20/15 1600 10/22/15 0542 10/23/15 1200  NA 131* 135 134* 135  K 5.6* 4.8 4.0 4.8  CL 91* 94* 101 92*  CO2 24 28 24 27   GLUCOSE 86 162* 277* 114*  BUN 80* 57* 14 78*  CREATININE 9.78* 7.60* 1.00 9.78*  CALCIUM 8.6* 8.9 8.4* 9.1  PHOS 6.9* 5.8*  --  5.8*   Liver Function Tests:  Recent Labs Lab 10/19/15 0201 10/20/15 1600 10/23/15 1200  ALBUMIN 1.9* 1.9* 2.0*   No results for input(s): LIPASE, AMYLASE in the last 168 hours. No results for input(s): AMMONIA in the last 168 hours. CBC:  Recent  Labs Lab 10/19/15 0201 10/20/15 0905 10/22/15 0728 10/23/15 1200  WBC 5.9 5.2 5.5 5.6  NEUTROABS  --  3.8  --   --   HGB 8.6* 9.3* 8.7* 8.8*  HCT 28.8* 30.5* 29.1* 28.1*  MCV 96.0 97.8 98.0 96.2  PLT 218 254 267 291   Cardiac Enzymes:  Recent Labs Lab 10/21/15 0936 10/21/15 1350 10/21/15 1800 10/21/15 2300  TROPONINI 0.10* 0.24* 0.31* 0.29*   CBG: No results for input(s): GLUCAP in the last 168 hours.  Studies/Results: No results found. Medications:   . amLODipine  10 mg Oral QHS  . aspirin  81 mg Oral Daily  . atorvastatin  40 mg Oral q1800  . brinzolamide  1 drop Both Eyes BID   And  . brimonidine  1 drop Both Eyes BID  . calcitRIOL  0.5 mcg Oral Daily  . carvedilol  25 mg Oral BID WC  . [START ON 10/25/2015] darbepoetin (ARANESP) injection - DIALYSIS  200 mcg Intravenous Q Wed-HD  . enoxaparin (LOVENOX) injection  30 mg Subcutaneous Q24H  . feeding supplement  1 Container Oral TID BM  . gabapentin  100  mg Oral QHS  . isosorbide mononitrate  30 mg Oral Daily  . latanoprost  1 drop Both Eyes QHS  . lisinopril  10 mg Oral Daily  . multivitamin  1 tablet Oral QHS  . naphazoline-pheniramine  1-2 drop Both Eyes BID  . pantoprazole  40 mg Oral Q1200  . senna-docusate  1 tablet Oral QHS  . sevelamer carbonate  1,600 mg Oral With snacks  . sevelamer carbonate  2,400 mg Oral TID WC

## 2015-10-24 NOTE — Progress Notes (Signed)
Recreational Therapy Session Note  Patient Details  Name: Randy [name] MRN: [id number] Date of Birth: [birth year] Today's Date: 10/24/2015  Order received and chart reviewed.  Familiar with pt from CIR prior to his his transfer to acute.  Pt is not appropriate for TR services at this time due to low activity tolerance and little interest in TR services.  Will continue to monitor through team.  Vernon Maish 10/24/2015, 1:56 PM

## 2015-10-24 NOTE — Progress Notes (Signed)
Orthopedic Tech Progress Note Patient Details:  Randy [name] 1932/04/19 [id number]  Patient ID: [name], male   DOB: 02-13-1932, 82 y.o.   MRN: [id number] Called in advanced brace order; spoke with Cletis [place], Eesa Justiss 10/24/2015, 9:28 AM

## 2015-10-25 ENCOUNTER — Inpatient Hospital Stay (HOSPITAL_COMMUNITY): Payer: Medicare Other | Admitting: Physical Therapy

## 2015-10-25 ENCOUNTER — Inpatient Hospital Stay (HOSPITAL_COMMUNITY): Payer: Medicare Other | Admitting: Occupational Therapy

## 2015-10-25 DIAGNOSIS — K5903 Drug induced constipation: Secondary | ICD-10-CM | POA: Diagnosis present

## 2015-10-25 LAB — FERRITIN: FERRITIN: 1242 ng/mL — AB (ref 24–336)

## 2015-10-25 LAB — RENAL FUNCTION PANEL
Albumin: 2.1 g/dL — ABNORMAL LOW (ref 3.5–5.0)
Anion gap: 16 — ABNORMAL HIGH (ref 5–15)
BUN: 55 mg/dL — AB (ref 6–20)
CHLORIDE: 92 mmol/L — AB (ref 101–111)
CO2: 25 mmol/L (ref 22–32)
CREATININE: 8.18 mg/dL — AB (ref 0.61–1.24)
Calcium: 8.7 mg/dL — ABNORMAL LOW (ref 8.9–10.3)
GFR calc Af Amer: 6 mL/min — ABNORMAL LOW (ref >60)
GFR calc non Af Amer: 5 mL/min — ABNORMAL LOW (ref >60)
GLUCOSE: 170 mg/dL — AB (ref 65–99)
POTASSIUM: 4.1 mmol/L (ref 3.5–5.1)
Phosphorus: 5.2 mg/dL — ABNORMAL HIGH (ref 2.5–4.6)
Sodium: 133 mmol/L — ABNORMAL LOW (ref 135–145)

## 2015-10-25 LAB — IRON AND TIBC
IRON: 24 ug/dL — AB (ref 45–182)
Saturation Ratios: 21 % (ref 17.9–39.5)
TIBC: 113 ug/dL — ABNORMAL LOW (ref 250–450)
UIBC: 89 ug/dL

## 2015-10-25 LAB — CBC
HEMATOCRIT: 27.8 % — AB (ref 39.0–52.0)
Hemoglobin: 8.5 g/dL — ABNORMAL LOW (ref 13.0–17.0)
MCH: 29.6 pg (ref 26.0–34.0)
MCHC: 30.6 g/dL (ref 30.0–36.0)
MCV: 96.9 fL (ref 78.0–100.0)
PLATELETS: 298 10*3/uL (ref 150–400)
RBC: 2.87 MIL/uL — ABNORMAL LOW (ref 4.22–5.81)
RDW: 18.8 % — AB (ref 11.5–15.5)
WBC: 8.5 10*3/uL (ref 4.0–10.5)

## 2015-10-25 MED ORDER — DARBEPOETIN ALFA 200 MCG/0.4ML IJ SOSY
PREFILLED_SYRINGE | INTRAMUSCULAR | Status: AC
  Administered 2015-10-25: 200 ug via INTRAVENOUS
  Filled 2015-10-25: qty 0.4

## 2015-10-25 MED ORDER — OXYCODONE HCL 5 MG PO TABS
5.0000 mg | ORAL_TABLET | ORAL | Status: DC | PRN
  Administered 2015-10-25 – 2015-10-30 (×14): 5 mg via ORAL
  Filled 2015-10-25 (×14): qty 1

## 2015-10-25 MED ORDER — OXYCODONE HCL 5 MG PO TABS
ORAL_TABLET | ORAL | Status: AC
  Filled 2015-10-25: qty 1

## 2015-10-25 MED ORDER — SENNOSIDES-DOCUSATE SODIUM 8.6-50 MG PO TABS
2.0000 | ORAL_TABLET | Freq: Every day | ORAL | Status: DC
  Administered 2015-10-25 – 2015-10-26 (×2): 2 via ORAL
  Filled 2015-10-25 (×3): qty 2

## 2015-10-25 MED ORDER — BISACODYL 10 MG RE SUPP
10.0000 mg | Freq: Once | RECTAL | Status: DC
  Filled 2015-10-25: qty 1

## 2015-10-25 MED ORDER — SENNOSIDES-DOCUSATE SODIUM 8.6-50 MG PO TABS: 1.0000 | ORAL_TABLET | Freq: Two times a day (BID) | ORAL | Status: DC

## 2015-10-25 NOTE — Progress Notes (Addendum)
[place] PHYSICAL MEDICINE & REHABILITATION     PROGRESS NOTE  Subjective/Complaints:  Patient sitting up in bed this morning. He states he spent the day yesterday to have a bowel movement without success.  ROS: +Constipation.  Denies CP, SOB, n/v/d.  Objective: Vital Signs: Blood pressure 140/68, pulse 80, temperature 98.9 F (37.2 C), temperature source Oral, resp. rate 17, weight 61.4 kg (135 lb 5.8 oz), SpO2 97 %. No results found.  Recent Labs  10/23/15 1200  WBC 5.6  HGB 8.8*  HCT 28.1*  PLT 291    Recent Labs  10/23/15 1200  NA 135  K 4.8  CL 92*  GLUCOSE 114*  BUN 78*  CREATININE 9.78*  CALCIUM 9.1   CBG (last 3)  No results for input(s): GLUCAP in the last 72 hours.  Wt Readings from Last 3 Encounters:  10/25/15 61.4 kg (135 lb 5.8 oz)  10/23/15 61 kg (134 lb 7.7 oz)  10/21/15 59 kg (130 lb 1.1 oz)    Physical Exam:  BP 140/68 mmHg  Pulse 80  Temp(Src) 98.9 F (37.2 C) (Oral)  Resp 17  Wt 61.4 kg (135 lb 5.8 oz)  SpO2 97% Constitutional: He appears well-developed and well-nourished. NAD HENT: Head: Normocephalic and atraumatic.  Mouth/Throat: Oropharynx is clear and moist.  Eyes: Conjunctivae and EOM are normal.  Cardiovascular: Normal rate and regular rhythm. Murmur heard. Respiratory: Effort normal and breath sounds normal. No stridor. No respiratory distress. He has no wheezes. He has no rales.  GI: Soft. Bowel sounds are normal. He exhibits no distension. There is no tenderness.  Musculoskeletal:  Right BKA well healed no edema, non tender Left AKA incision c/d/i. No tenderness to palpation.  Neurological: He is alert and oriented.  4+/5 in BUE and R Hip flexor, LLE 4+/5 Skin: Skin is warm.  Psychiatric: He has a normal mood and affect.   Assessment/Plan: 1. Functional deficits secondary to left AKA 10/10/15 with prior hx of R BKA which require 3+ hours per day of interdisciplinary therapy in a comprehensive inpatient rehab  setting. Physiatrist is providing close team supervision and 24 hour management of active medical problems listed below. Physiatrist and rehab team continue to assess barriers to discharge/monitor patient progress toward functional and medical goals.  Function:  Bathing Bathing position   Position: Other (comment) (sitting on BSC)  Bathing parts Body parts bathed by patient: Chest, Left arm, Right arm, Abdomen, Front perineal area Body parts bathed by helper: Buttocks, Back, Right upper leg, Left upper leg  Bathing assist Assist Level:  (mod A)      Upper Body Dressing/Undressing Upper body dressing   What is the patient wearing?: Pull over shirt/dress     Pull over shirt/dress - Perfomed by patient: Thread/unthread right sleeve, Thread/unthread left sleeve, Put head through opening, Pull shirt over trunk          Upper body assist Assist Level: Set up      Lower Body Dressing/Undressing Lower body dressing   What is the patient wearing?: Underwear Underwear - Performed by patient: Thread/unthread right underwear leg, Thread/unthread left underwear leg Underwear - Performed by helper: Pull underwear up/down                          Lower body assist Assist for lower body dressing:  (mod A)      Toileting Toileting   Toileting steps completed by patient: Adjust clothing prior to toileting, Performs perineal  hygiene Toileting steps completed by helper: Adjust clothing after toileting    Toileting assist Assist level:  (mod A)   Transfers Chair/bed transfer   Chair/bed transfer method: Anterior/posterior Chair/bed transfer assist level: Moderate assist (Pt 50 - 74%/lift or lower) Chair/bed transfer assistive device: Armrests     Locomotion Ambulation Ambulation activity did not occur: Safety/medical concerns         Wheelchair          Cognition Comprehension Comprehension assist level: Understands complex 90% of the time/cues 10% of the time   Expression Expression assist level: Expresses complex 90% of the time/cues < 10% of the time  Social Interaction Social Interaction assist level: Interacts appropriately with others - No medications needed.  Problem Solving Problem solving assist level: Solves basic 90% of the time/requires cueing < 10% of the time  Memory Memory assist level: Recognizes or recalls 90% of the time/requires cueing < 10% of the time    Medical Problem List and Plan: 1. 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 DVT Prophylaxis/Anticoagulation: Pharmaceutical: Lovenox 3. Pain Management: Oxycodone IR 4. Mood: monitor for reactive depression 5. Neuropsych: This patient is capable of making decisions on his own behalf. 6. Skin/Wound Care: Dressing changes and ACE wrap to LLE 7. Fluids/Electrolytes/Nutrition: Monitor I/Os, meal intake, may need dietary consult  Eating 30-60% of meals 8. [id number], lisinopril, coreg, with prn hydralazine  Labile at present, will cont to monitor with therapies and consider adjustment of meds if consistently elevated 9. ESRD: HD managed by [facility] F 10 Anemia of chronic disease: Aranesp weekly in HD  Will follow-up with labs 11. NISCM/CAD:Lipitor , ASA, Coreg 12.  Constipation  Will increase bowel meds again on 2/1  Will also decrease narcotics  LOS (Days) 2 A FACE TO FACE EVALUATION WAS PERFORMED  Randy [name] Lorie Phenix 10/25/2015 8:00 AM

## 2015-10-25 NOTE — Progress Notes (Signed)
Subjective:   Getting PT- no new complaints  Objective Vital signs in last 24 hours: Filed Vitals:   10/24/15 1030 10/24/15 1514 10/24/15 2014 10/25/15 0507  BP: 142/72 165/77 155/77 140/68  Pulse: 78 85 90 80  Temp:  98.1 F (36.7 C)  98.9 F (37.2 C)  TempSrc:  Oral  Oral  Resp:  16  17  Weight:    61.4 kg (135 lb 5.8 oz)  SpO2:  98%  97%   Weight change: 0.8 kg (1 lb 12.2 oz) Physical Exam: General=Alert  Calm , NAD Chest = CTA bilat Card =RRR no m. r. g. Abd= soft nt nd  Ext =L AKA and R BKA,No edema HD access= pos. 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 CP- think plan is for medical management and to keep BP down - imdur/amlod/coreg 25/lisinopril 10- unclear if will tolerate all those agents but so far he is  Juliet Vasbinder A   10/25/2015, 10:24 AM    Labs: Basic Metabolic Panel:  Recent Labs Lab 10/19/15 0201 10/20/15 1600 10/22/15 0542 10/23/15 1200  NA 131* 135 134* 135  K 5.6* 4.8 4.0 4.8  CL 91* 94* 101 92*  CO2 24 28 24 27   GLUCOSE 86 162* 277* 114*  BUN 80* 57* 14 78*  CREATININE 9.78* 7.60* 1.00 9.78*  CALCIUM 8.6* 8.9 8.4* 9.1  PHOS 6.9* 5.8*  --  5.8*   Liver Function Tests:  Recent Labs Lab 10/19/15 0201 10/20/15 1600 10/23/15 1200  ALBUMIN 1.9* 1.9* 2.0*   No results for input(s): LIPASE, AMYLASE in the last 168 hours. No results for input(s): AMMONIA in the last 168 hours. CBC:  Recent Labs Lab 10/19/15 0201  10/20/15 0905 10/22/15 0728 10/23/15 1200  WBC 5.9 5.2 5.5 5.6  NEUTROABS  --  3.8  --   --   HGB 8.6* 9.3* 8.7* 8.8*  HCT 28.8* 30.5* 29.1* 28.1*  MCV 96.0 97.8 98.0 96.2  PLT 218 254 267 291   Cardiac Enzymes:  Recent Labs Lab 10/21/15 0936 10/21/15 1350 10/21/15 1800 10/21/15 2300  TROPONINI 0.10* 0.24* 0.31* 0.29*   CBG: No results for input(s): GLUCAP in the last 168 hours.  Studies/Results: No results found. Medications:   . amLODipine  10 mg Oral QHS  . aspirin  81 mg Oral Daily  . atorvastatin  40 mg Oral q1800  . bisacodyl  10 mg Rectal Once  . brinzolamide  1 drop Both Eyes BID   And  . brimonidine  1 drop Both Eyes BID  . calcitRIOL  0.5 mcg Oral Daily  . carvedilol  25 mg Oral BID WC  . darbepoetin (ARANESP) injection - DIALYSIS  200 mcg Intravenous Q Wed-HD  . enoxaparin (LOVENOX) injection  30 mg Subcutaneous Q24H  . feeding supplement  1 Container Oral TID BM  . gabapentin  100 mg Oral QHS  . isosorbide mononitrate  30 mg Oral Daily  . latanoprost  1 drop Both Eyes QHS  . lisinopril  10 mg Oral Daily  . multivitamin  1 tablet Oral QHS  . naphazoline-pheniramine  1-2 drop Both Eyes BID  . pantoprazole  40 mg Oral Q1200  . senna-docusate  1 tablet Oral BID  . sevelamer carbonate  1,600 mg Oral With snacks  . sevelamer carbonate  2,400 mg Oral TID WC

## 2015-10-25 NOTE — Progress Notes (Signed)
Pt. disimpacted for LARGE amount of hard stool after SSE; tolerated well. Now on scheduled Senna. Monitor

## 2015-10-25 NOTE — Progress Notes (Signed)
Occupational Therapy Session Note  Patient Details  Name: Randy [name] MRN: [id number] Date of Birth: 1931/11/07  Today's Date: 10/25/2015 OT Individual Time: 0815-0900 OT Individual Time Calculation (min): 45 min    Short Term Goals: Week 2:  OT Short Term Goal 1 (Week 2): Continue week 1 goals secondary to interrupted stay  Skilled Therapeutic Interventions/Progress Updates:    Upon entering the room, pt in bed finishing breakfast this session. Pt declined therapy and required maximal coaxing and encouragement for participation. OT educated pt on OT purpose , POC, and goals. Pt stating his own goal as being, "To go home and be stronger." OT stressing the importance of participation with therapy in order to reach his own goals. Pt verbalized understanding.  Pt focused on having BM again this session. Pt performed anterior posterior transfer with max A for lifting as pt was unable to fully clear bottom during transfer and pt displaying skin integrity concerns from skin shearing on buttocks. RN notified. Pt performing his own manual fecal disimpaction this session. OT encouraged pt to perform hygiene but required assistance for thoroughness. Pt returning back to bed . Pt declined further OT intervention secondary to fatigue. Call bell and all needed items within reach upon exiting the room.   Therapy Documentation Precautions:  Precautions Precautions: Fall Precaution Comments: recent Lt AKA, h/o Rt BKA Restrictions Weight Bearing Restrictions: Yes LLE Weight Bearing: Non weight bearing General:   Vital Signs: Therapy Vitals Temp: 98.9 F (37.2 C) Temp Source: Oral Pulse Rate: 80 Resp: 17 BP: 140/68 mmHg Patient Position (if appropriate): Lying Oxygen Therapy SpO2: 97 % O2 Device: Not Delivered  See Function Navigator for Current Functional Status.   Therapy/Group: Individual Therapy  [name] 10/25/2015, 9:06 AM

## 2015-10-25 NOTE — Plan of Care (Signed)
Problem: RH BOWEL ELIMINATION Goal: RH STG MANAGE BOWEL WITH ASSISTANCE STG Manage Bowel with min Assistance.  Outcome: Not Progressing LBM 10/17/2015- pt took several medication and staff attempted disimpaction. Pt cannot tolerate.

## 2015-10-25 NOTE — Progress Notes (Signed)
Physical Therapy Session Note  Patient Details  Name: Randy [name] MRN: [id number] Date of Birth: 1932/06/20  Today's Date: 10/25/2015 PT Individual Time: 1111-1204 PT Individual Time Calculation (min): 53 min   Short Term Goals: Week 2:  PT Short Term Goal 1 (Week 2): Last weekly note performed on 10/20/15-STG now = LTG due to LOS after interruption of stay  Skilled Therapeutic Interventions/Progress Updates:    Pt received in w/c; reporting continued constipation and abdominal discomfort but willing to participate.  Performed w/c mobility x 150' to gym with supervision.  Performed anterior transfer w/c > mat with mod A for trunk control, balance and to elevate hips to prevent shear and friction on buttocks.  Pt participated in [place] and strength training in supine, prone and R/L sidelying with focus on decreasing hip flexion contracture, increasing hip extension ROM, strengthing of hip extension and hip ABD as well as abdominal strengthening with trunk rotation and rolling to L and R with min-mod A.  Returned to sitting with mod A and performed UE latissimus and tricep strengthening with push up blocks and lateral weight shifting to L with R trunk shortening for postural control.  Returned to w/c with posterior transfer and mod A.  Returned to room and handed off to RN for transfer to bed for enema.    Therapy Documentation Precautions:  Precautions Precautions: Fall Precaution Comments: recent Lt AKA, h/o Rt BKA Restrictions Weight Bearing Restrictions: Yes LLE Weight Bearing: Non weight bearing Pain: Pain Assessment Pain Assessment: No/denies pain Pain Score: 5  Pain Type: Surgical pain Pain Location: Leg Pain Orientation: Left Pain Descriptors / Indicators: Aching;Tender;Sore Pain Onset: Gradual Patients Stated Pain Goal: 3 Pain Intervention(s): RN made aware;Repositioned;Emotional support Multiple Pain Sites: No   See Function Navigator for Current Functional  Status.   Therapy/Group: Individual Therapy  [name] [facility] 10/25/2015, 12:46 PM

## 2015-10-25 NOTE — Progress Notes (Signed)
Physical Therapy Session Note  Patient Details  Name: Randy [name] MRN: [id number] Date of Birth: 29-Mar-1932  Today's Date: 10/25/2015 PT Individual Time: 1325-1355 PT Individual Time Calculation (min): 30 min   Short Term Goals: Week 1:  PT Short Term Goal 1 (Week 1): pt will perform functional transfers with min A PT Short Term Goal 2 (Week 1): pt will demo dyanmic sitting balance for functional task with min A     Therapy Documentation Precautions:  Precautions Precautions: Fall Precaution Comments: recent Lt AKA, h/o Rt BKA Restrictions Weight Bearing Restrictions: Yes LLE Weight Bearing: Non weight bearing   Pain: Pain Assessment Pain Assessment: 0-10 Pain Score: 6  Pain Type: Surgical pain Pain Location: Leg Pain Orientation: Left Pain Descriptors / Indicators: Aching;Sore;Tender Pain Onset: Gradual Patients Stated Pain Goal: 3 Pain Intervention(s): Medication (See eMAR) Multiple Pain Sites: No  Patient received supine in bed. Patient agreeable to therapy. Patient reporting pain level of 6 at beginning of session at left residual limb. RN provided pain medication. Patient supine to sit in bed supervision with utilization of bedrails. Min-Mod assist AP transfer from bed to wheelchair. Patient propelled wheelchair supervison 200 feetx2. Verbal cues for hand placement on rim of wheelchair and negotiation on tight spaces. Patient performed car transfer with slide board for trials. Patient required set-up assistance and placement of slide board for both trials. Patient required min assist of first trial and supervision on second trial. Patient stated he would be going home ina [employer] and that he would get a family member to measure the height in order to assist with car transfer simulation. Patient returned to bed at end of session with all needs met prior to exit. Patient performed AP transfer from [facility] to bed min-mod assist.   See Function Navigator for Current Functional  Status.   Therapy/Group: Individual Therapy  [name] 10/25/2015, 2:08 PM

## 2015-10-25 NOTE — Progress Notes (Signed)
Physical Therapy Session Note  Patient Details  Name: Randy [name] MRN: [id number] Date of Birth: 1932-09-13  Today's Date: 10/25/2015 PT Individual Time: 1006-1036 PT Individual Time Calculation (min): 30 min   Short Term Goals: Week 1:  PT Short Term Goal 1 (Week 1): pt will perform functional transfers with min A PT Short Term Goal 2 (Week 1): pt will demo dyanmic sitting balance for functional task with min A  Skilled Therapeutic Interventions/Progress Updates:    pt received resting in bed, c/o 5/10 pain in LLE and constipation; RN in to distribute meds and pt agreeable to therapy session but declined donning prosthesis.  PT provided patient education on limb wrapping using hip anchor technique and demonstrated to patient; education to continue throughout stay.  Pt transferred supine>sit with mod assist for trunk control and AP transfer to w/c using bed rail with mod assist.  Attempted AP transfer without bed rail but pt refused.  Pt propelled w/c to ortho gym with supervision and verbal cues for navigating doorway.  Attempted car transfer at simulated SUV height using slide board with total assist, but pt unable to safely complete without prosthesis for balance and returned to w/c.  PT provided patient education on use of prosthesis for all car transfers and pt verbalized understanding.  Will reattempt with prosthesis donned during a future session.  Pt returned to room in w/c at end of session and positioned with call bell in reach and needs met. NT and RN alerted to no QRB in pt's room and stated they will f/u.   Therapy Documentation Precautions:  Precautions Precautions: Fall Precaution Comments: recent Lt AKA, h/o Rt BKA Restrictions Weight Bearing Restrictions: Yes LLE Weight Bearing: Non weight bearing   Pain: Pain Assessment Pain Assessment: 0-10 Pain Score: 5  Pain Type: Surgical pain Pain Location: Leg Pain Orientation: Left Pain Descriptors / Indicators:  Aching;Tender;Sore Pain Onset: Gradual Patients Stated Pain Goal: 3 Pain Intervention(s): RN made aware;Repositioned;Emotional support Multiple Pain Sites: No   See Function Navigator for Current Functional Status.   Therapy/Group: Individual Therapy  Earnest Conroy Penven-Crew 10/25/2015, 12:20 PM

## 2015-10-26 ENCOUNTER — Inpatient Hospital Stay (HOSPITAL_COMMUNITY): Payer: Medicare Other | Admitting: Physical Therapy

## 2015-10-26 ENCOUNTER — Encounter (HOSPITAL_COMMUNITY): Payer: Medicare Other

## 2015-10-26 ENCOUNTER — Inpatient Hospital Stay (HOSPITAL_COMMUNITY): Payer: Medicare Other

## 2015-10-26 DIAGNOSIS — F4321 Adjustment disorder with depressed mood: Secondary | ICD-10-CM | POA: Diagnosis present

## 2015-10-26 MED ORDER — SODIUM CHLORIDE 0.9 % IV SOLN
125.0000 mg | INTRAVENOUS | Status: DC
  Administered 2015-10-27: 125 mg via INTRAVENOUS
  Filled 2015-10-26 (×3): qty 10

## 2015-10-26 NOTE — Progress Notes (Signed)
Occupational Therapy Session Note  Patient Details  Name: Randy [name] MRN: [id number] Date of Birth: 04/14/1932  Today's Date: 10/26/2015 OT Individual Time: 1300-1400 OT Individual Time Calculation (min): 60 min    Short Term Goals: Week 1: LTG  Skilled Therapeutic Interventions/Progress Updates: ADL-retraining (30 min) with focus on family edcuation on A/P and slide board transfers.  With family present, pt completed anterior transfer to bed in prep for re-wrapping of LLE.  OT demo's supervision assistance with transfer as pt progressed.   MD (Dr. Doren Custard) arrived during hygiene supine and inspected LLE and wound; per MD,  bandaging or wrapping wound/limb is no longer required d/t excellent wound healing.   Pt then completed slide board transfer back to his w/c with only setup assist as OT educated family members on min guard/standby assist recommended.   Therex (30 min) with focus on BUE strengthening, core strengthening, seated unsupported on raised mat.   Pt completed all 4 exercises present, 2 with elastic band and 2 without.   Written HEP provided.  Pt left seated in his w/c in his room at end of session with family present.  SW alerted to need for transfer board at discharge.      Therapy Documentation Precautions:  Precautions Precautions: Fall Precaution Comments: recent Lt AKA, h/o Rt BKA Restrictions Weight Bearing Restrictions: Yes LLE Weight Bearing: Non weight bearing  Pain: Pain Assessment Pain Assessment: 0-10 Pain Score: 6  Pain Location: Leg Pain Orientation: Left Pain Descriptors / Indicators: Aching Pain Onset: On-going Patients Stated Pain Goal: 4 Pain Intervention(s): Medication (See eMAR);Repositioned  See Function Navigator for Current Functional Status.   Therapy/Group: Individual Therapy  [place] 10/26/2015, 2:19 PM

## 2015-10-26 NOTE — Progress Notes (Signed)
   VASCULAR SURGERY ASSESSMENT & PLAN:  * Postop day 16: his left AKA is healing nicely. He is scheduled to have his staples removed in approximate 2 weeks.  SUBJECTIVE: No complaints.  PHYSICAL EXAM: Filed Vitals:   10/25/15 2134 10/25/15 2156 10/26/15 0500 10/26/15 0549  BP: 131/66 131/61  137/61  Pulse: 81   75  Temp:    98.3 F (36.8 C)  TempSrc:    Oral  Resp:    18  Weight:   127 lb 6.8 oz (57.8 kg)   SpO2:    98%   Left AKA healing nicely. No erythema or drainage.  LABS: Lab Results  Component Value Date   WBC 8.5 10/25/2015   HGB 8.5* 10/25/2015   HCT 27.8* 10/25/2015   MCV 96.9 10/25/2015   PLT 298 10/25/2015   Lab Results  Component Value Date   CREATININE 8.18* 10/25/2015    Active Problems:   HLD (hyperlipidemia)   Hypertensive heart disease   Secondary cardiomyopathy ([place])   End stage renal disease, has been peritoneal, now hemodialysis   Anemia of chronic disease   Acute on chronic combined systolic and diastolic CHF (congestive heart failure) ([place])   ESRD (end stage renal disease) on dialysis ([place])   Blood loss anemia   CAD (coronary artery disease)   Abnormality of gait   Status post above knee amputation of left lower extremity ([place])   ESRD on dialysis [facility])   Essential hypertension   Post-operative pain   Unilateral AKA (HCC)   S/P AKA (above knee amputation) unilateral ([place])   History of right above knee amputation ([place])   Labile blood pressure   Unilateral complete BKA ([place])   Slow transit constipation   Constipation due to pain medication   Adjustment disorder with depressed mood   [name] Beeper: [id number] 10/26/2015

## 2015-10-26 NOTE — Progress Notes (Signed)
[place] PHYSICAL MEDICINE & REHABILITATION     PROGRESS NOTE  Subjective/Complaints:  Pt seen sitting up in his wheelchair working with PT.  He slept well overnight and feels good this AM.  He ended up having a BM yesterday and was very pleased about that.   ROS: Denies LE pain, CP, SOB, n/v/d.  Objective: Vital Signs: Blood pressure 137/61, pulse 75, temperature 98.3 F (36.8 C), temperature source Oral, resp. rate 18, weight 57.8 kg (127 lb 6.8 oz), SpO2 98 %. No results found.  Recent Labs  10/23/15 1200 10/25/15 1628  WBC 5.6 8.5  HGB 8.8* 8.5*  HCT 28.1* 27.8*  PLT 291 298    Recent Labs  10/23/15 1200 10/25/15 1628  NA 135 133*  K 4.8 4.1  CL 92* 92*  GLUCOSE 114* 170*  BUN 78* 55*  CREATININE 9.78* 8.18*  CALCIUM 9.1 8.7*   CBG (last 3)  No results for input(s): GLUCAP in the last 72 hours.  Wt Readings from Last 3 Encounters:  10/26/15 57.8 kg (127 lb 6.8 oz)  10/23/15 61 kg (134 lb 7.7 oz)  10/21/15 59 kg (130 lb 1.1 oz)    Physical Exam:  BP 137/61 mmHg  Pulse 75  Temp(Src) 98.3 F (36.8 C) (Oral)  Resp 18  Wt 57.8 kg (127 lb 6.8 oz)  SpO2 98% Constitutional: He appears well-developed and well-nourished. NAD HENT: Head: Normocephalic and atraumatic.  Mouth/Throat: Oropharynx is clear and moist.  Eyes: Conjunctivae and EOM are normal.  Cardiovascular: Normal rate and regular rhythm. Murmur heard. Respiratory: Effort normal and breath sounds normal. No stridor. No respiratory distress. He has no wheezes. He has no rales.  GI: Soft. Bowel sounds are normal. He exhibits no distension. There is no tenderness.  Musculoskeletal:  Right BKA well healed no edema, non tender Left AKA with dressing c/d/i. No tenderness to palpation.  Neurological: He is alert and oriented.  4+/5 in BUE and R Hip flexor, LLE 4+/5 Skin: Skin is warm.  Psychiatric: He has a normal mood and affect.   Assessment/Plan: 1. Functional deficits secondary to left  AKA 10/10/15 with prior hx of R BKA which require 3+ hours per day of interdisciplinary therapy in a comprehensive inpatient rehab setting. Physiatrist is providing close team supervision and 24 hour management of active medical problems listed below. Physiatrist and rehab team continue to assess barriers to discharge/monitor patient progress toward functional and medical goals.  Function:  Bathing Bathing position   Position: Other (comment) (sitting on BSC)  Bathing parts Body parts bathed by patient: Chest, Left arm, Right arm, Abdomen, Front perineal area Body parts bathed by helper: Buttocks, Back, Right upper leg, Left upper leg  Bathing assist Assist Level:  (mod A)      Upper Body Dressing/Undressing Upper body dressing   What is the patient wearing?: Pull over shirt/dress     Pull over shirt/dress - Perfomed by patient: Thread/unthread right sleeve, Thread/unthread left sleeve, Put head through opening, Pull shirt over trunk          Upper body assist Assist Level: Set up      Lower Body Dressing/Undressing Lower body dressing   What is the patient wearing?: Underwear Underwear - Performed by patient: Thread/unthread right underwear leg, Thread/unthread left underwear leg Underwear - Performed by helper: Pull underwear up/down                          Lower body  assist Assist for lower body dressing:  (mod A)      Toileting Toileting   Toileting steps completed by patient: Adjust clothing prior to toileting, Performs perineal hygiene Toileting steps completed by helper: Adjust clothing prior to toileting, Performs perineal hygiene, Adjust clothing after toileting    Toileting assist Assist level:  (total A)   Transfers Chair/bed transfer   Chair/bed transfer method: Anterior/posterior Chair/bed transfer assist level: Moderate assist (Pt 50 - 74%/lift or lower) Chair/bed transfer assistive device: Armrests     Locomotion Ambulation Ambulation  activity did not occur: Safety/medical concerns         Wheelchair   Type: Manual Max wheelchair distance: 200 Assist Level: Supervision or verbal cues  Cognition Comprehension Comprehension assist level: Follows basic conversation/direction with no assist  Expression Expression assist level: Expresses basic needs/ideas: With no assist  Social Interaction Social Interaction assist level: Interacts appropriately with others with medication or extra time (anti-anxiety, antidepressant).  Problem Solving Problem solving assist level: Solves basic 90% of the time/requires cueing < 10% of the time  Memory Memory assist level: Recognizes or recalls 90% of the time/requires cueing < 10% of the time    Medical Problem List and Plan: 1. Decreased mobility and self care secondary to Left AKA with prior hx of R BKA  Cont CIR 2. DVT Prophylaxis/Anticoagulation: Pharmaceutical: Lovenox 3. Pain Management: Oxycodone IR 4. Mood: monitor for reactive depression 5. Neuropsych: This patient is capable of making decisions on his own behalf. 6. Skin/Wound Care: Dressing changes and ACE wrap to LLE 7. Fluids/Electrolytes/Nutrition: Monitor I/Os, meal intake, may need dietary consult  Eating 30-60% of meals 8. [id number], lisinopril, coreg, with prn hydralazine  One elevated reading yesterday, however, fairly well controlled.   Will cont to monitor with therapies and consider adjustment of meds if persistently elevated 9. ESRD: HD managed by Nephro M-W F 10 Anemia of chronic disease: Aranesp weekly in HD  Hb 8.5 on 2/1 11. NISCM/CAD:Lipitor , ASA, Coreg 12.  Constipation  Increased bowel meds on 2/1 with results  Narcotics also decreased  LOS (Days) 3 A FACE TO FACE EVALUATION WAS PERFORMED  [name] 10/26/2015 8:32 AM

## 2015-10-26 NOTE — IPOC Note (Signed)
Overall Plan of Care [facility]) Patient Details Name: Randy [name] MRN: [id number] DOB: [birth year]  Admitting Diagnosis: L AKA  Hospital Problems: Active Problems:   HLD (hyperlipidemia)   Hypertensive heart disease   Secondary cardiomyopathy ([place])   End stage renal disease, has been peritoneal, now hemodialysis   Anemia of chronic disease   Acute on chronic combined systolic and diastolic CHF (congestive heart failure) (HCC)   ESRD (end stage renal disease) on dialysis (HCC)   Blood loss anemia   CAD (coronary artery disease)   Abnormality of gait   Status post above knee amputation of left lower extremity (HCC)   ESRD on dialysis [facility])   Essential hypertension   Post-operative pain   Unilateral AKA (HCC)   S/P AKA (above knee amputation) unilateral (HCC)   History of right above knee amputation ([place])   Labile blood pressure   Unilateral complete BKA (HCC)   Slow transit constipation   Constipation due to pain medication   Adjustment disorder with depressed mood     Functional Problem List: Nursing Bowel, Endurance, Medication Management, Motor, Pain, Skin Integrity, Sensory, Safety  PT Balance, Endurance, Motor, Pain, Sensory  OT    SLP    TR         Basic ADL's: OT       Advanced  ADL's: OT       Transfers: PT Bed Mobility, Bed to Chair, Car, Furniture  OT       Locomotion: PT Wheelchair Mobility     Additional Impairments: OT    SLP        TR      Anticipated Outcomes Item Anticipated Outcome  Self Feeding    Swallowing      Basic self-care  supervision - min A  Toileting  min A   Bathroom Transfers Min A  Bowel/Bladder  Mod I  Transfers  Supervision  Locomotion  Supervision w/c level  Communication     Cognition     Pain  < 4  Safety/Judgment  Mod I   Therapy Plan: PT Intensity: Minimum of 1-2 x/day ,45 to 90 minutes PT Frequency: Total of 15 hours over 7 days of combined therapies PT Duration Estimated Length of Stay: 5-7   days OT Intensity: Minimum of 1-2 x/day, 45 to 90 minutes OT Frequency: Total of 15 hours over 7 days of combined therapies OT Duration/Estimated Length of Stay: 5-7 days         Team Interventions: Nursing Interventions Patient/Family Education, Bowel Management, Disease Management/Prevention, Pain Management, Medication Management, Skin Care/Wound Management, Discharge Planning  PT interventions Balance/vestibular training, Discharge planning, Disease management/prevention, DME/adaptive equipment instruction, Functional mobility training, Pain management, Patient/family education, Neuromuscular re-education, Psychosocial support, Skin care/wound management, Splinting/orthotics, Therapeutic Activities, Therapeutic Exercise, UE/LE Strength taining/ROM, Wheelchair propulsion/positioning  OT Interventions Balance/vestibular training, DME/adaptive equipment instruction, Functional electrical stimulation, Functional mobility training, Patient/family education, Self Care/advanced ADL retraining, Therapeutic Activities, Therapeutic Exercise, UE/LE Strength taining/ROM, Wheelchair propulsion/positioning  SLP Interventions    TR Interventions    SW/CM Interventions      Team Discharge Planning: Destination: PT-Home ,OT- Home , SLP-  Projected Follow-up: PT-Home health PT, 24 hour supervision/assistance, OT-  Home health OT, SLP-  Projected Equipment Needs: PT-None recommended by PT, OT- To be determined, SLP-  Equipment Details: PT-pt has RW, cane, manual w/c and scooter, OT-  Patient/family involved in discharge planning: PT- Patient,  OT-Patient, SLP-   MD ELOS: 7-10d Medical Rehab Prognosis:  Good Assessment: 82 y.o.  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 Patient elected to undergo L-AKA on 10/10/15 by Dr. Donnetta Hutching.   Now requiring 24/7 Rehab RN,MD, as well  as CIR level PT, OT .  Treatment team will focus on ADLs and mobility with goals set at min A   See Team Conference Notes for weekly updates to the plan of care

## 2015-10-26 NOTE — Progress Notes (Signed)
Subjective:   S/p HD yest PM- uf of 3500 tolerated well - was constipated and had BM yesterday  Objective Vital signs in last 24 hours: Filed Vitals:   10/25/15 2134 10/25/15 2156 10/26/15 0500 10/26/15 0549  BP: 131/66 131/61  137/61  Pulse: 81   75  Temp:    98.3 F (36.8 C)  TempSrc:    Oral  Resp:    18  Weight:   57.8 kg (127 lb 6.8 oz)   SpO2:    98%   Weight change: -0.2 kg (-7.1 oz) Physical Exam: General=Alert  Calm , NAD Chest = CTA bilat Card =RRR no m. r. g. Abd= soft nt nd  Ext =L AKA and R BKA,No edema HD access= pos. 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 ca ^  = on calcitriol / phos  5.2  on Renvela  2400 ac 5 Anemia = hgb 9.3-->8.5  cont esa- sat 21 and ferr 1200 but probably needs iron 6. CP- think plan is for medical management and to keep BP down - imdur/amlod/coreg 25/lisinopril 10- unclear if will tolerate all those agents but so far he is  Randy [name] A   10/26/2015, 9:28 AM    Labs: Basic Metabolic Panel:  Recent Labs Lab 10/20/15 1600 10/22/15 0542 10/23/15 1200 10/25/15 1628  NA 135 134* 135 133*  K 4.8 4.0 4.8 4.1  CL 94* 101 92* 92*  CO2 28 24 27 25   GLUCOSE 162* 277* 114* 170*  BUN 57* 14 78* 55*  CREATININE 7.60* 1.00 9.78* 8.18*  CALCIUM 8.9 8.4* 9.1 8.7*  PHOS 5.8*  --  5.8* 5.2*   Liver Function Tests:  Recent Labs Lab 10/20/15 1600 10/23/15 1200 10/25/15 1628  ALBUMIN 1.9* 2.0* 2.1*   No results for input(s): LIPASE, AMYLASE in the last 168 hours. No results for input(s): AMMONIA in the last 168 hours. CBC:  Recent Labs Lab  10/20/15 0905 10/22/15 0728 10/23/15 1200 10/25/15 1628  WBC 5.2 5.5 5.6 8.5  NEUTROABS 3.8  --   --   --   HGB 9.3* 8.7* 8.8* 8.5*  HCT 30.5* 29.1* 28.1* 27.8*  MCV 97.8 98.0 96.2 96.9  PLT 254 267 291 298   Cardiac Enzymes:  Recent Labs Lab 10/21/15 0936 10/21/15 1350 10/21/15 1800 10/21/15 2300  TROPONINI 0.10* 0.24* 0.31* 0.29*   CBG: No results for input(s): GLUCAP in the last 168 hours.  Studies/Results: No results found. Medications:   . amLODipine  10 mg Oral QHS  . aspirin  81 mg Oral Daily  . atorvastatin  40 mg Oral q1800  . bisacodyl  10 mg Rectal Once  . brinzolamide  1 drop Both Eyes BID   And  . brimonidine  1 drop Both Eyes BID  . calcitRIOL  0.5 mcg Oral Daily  . carvedilol  25 mg Oral BID WC  . darbepoetin (ARANESP) injection - DIALYSIS  200 mcg Intravenous Q Wed-HD  . enoxaparin (LOVENOX) injection  30 mg Subcutaneous Q24H  . feeding supplement  1 Container Oral TID  BM  . gabapentin  100 mg Oral QHS  . isosorbide mononitrate  30 mg Oral Daily  . latanoprost  1 drop Both Eyes QHS  . lisinopril  10 mg Oral Daily  . multivitamin  1 tablet Oral QHS  . naphazoline-pheniramine  1-2 drop Both Eyes BID  . pantoprazole  40 mg Oral Q1200  . senna-docusate  2 tablet Oral QHS  . sevelamer carbonate  1,600 mg Oral With snacks  . sevelamer carbonate  2,400 mg Oral TID WC

## 2015-10-26 NOTE — Consult Note (Signed)
[place]   Mr. Randy [name] is a 82 year old man, who was seen for an initial psychodiagnostic evaluation post left AKA to assess for symptoms of depression.    During the session, Mr. Randy [name] described multiple sources of discomfort, which transitioned to pain that became disruptive to the session as time progressed; his pain was mostly phantom limb pain (in both legs), and on his rear end owing to a bed sore.  From an emotional standpoint, Mr. Randy [name] acknowledged frustration at times, particularly with the fact that this recovery is slower than his previous recovery.  He noted that he often does not feel motivated for various therapy sessions, because he does not believe that he can physically perform the activities requested of him; he admitted that after trying what he is asked, he frequently surprises himself regarding what he is capable of doing, but this does not change his overall outlook about his general capabilities.  Mr. Randy [name] denied major concerns as well as other symptoms of depression, including suicidal ideation.  He stated that he has significant support from his family.  He denied issues with sleep, but stated that he is sometimes tired during the day; his appetite is purportedly poor, though he said that is secondary to unappetizing hospital food.  Mr. Randy [name] said that he often copes by distracting himself when he has unpleasant thoughts.  Currently, he watches television as a distraction.  Time was spent discussing how he can use distraction as a technique to manage pain as well.  This technique was implemented during the current session to help him deal with pain before his nurse was able to bring him pain medication.  This technique seemed effective.  Mostly, he talked about hunting and fishing, but also talked some about popular culture and his family.    IMPRESSION:  Mr. Randy [name] seems to be experiencing an adjustment disorder  to illness with depressed mood.  However, his mood disruption appears mild at this time and therefore, no medications to enhance mood are likely indicated.  Staff members may find it helpful to encourage him to participate by reminding him that he may surprise himself by what he is capable of doing.  They may also find it helpful to talk about something unrelated while he is doing activities in order to distract him.  His family may choose to bring in magazines about hunting and fishing to help give him opportunities for self-distraction.  A follow-up session with the neuropsychologist could be requested, should the treatment team feel that it would be beneficial in informing care.    DIAGNOSES:   Adjustment disorder with depressed mood AKA  Marlane Hatcher, Psy.D.  Clinical Neuropsychologist

## 2015-10-26 NOTE — Progress Notes (Signed)
Social Work Patient ID: Randy [name], male   DOB: March 27, 1932, 82 y.o.   MRN: [id number]   CSW met with pt briefly and then spoke with his dtr via telephone on 10-25-15 to update them on pt's new d/c date.  She was pleased that he would be going home next week.  She planned to come for family education today and will work with pt's wife/niece to see when they could come in prior to pt's d/c on 10-31-15.  CSW arranged [place] for pt with [facility] for RN/PT/OT/bath aide.  Pt may need some DME and CSW will assist with this.  Pt's dtr also wanted pt to receive w/c transportation from [facility], but CSW spoke with [name] there and learned that pt would have to be placed on the waiting list.  Did share this with dtr and told her about [facility], but that it would be about $35 round trip to dialysis.  She will try to make some phone calls, too.  CSW will continue to follow and assist as needed.

## 2015-10-26 NOTE — Patient Care Conference (Signed)
Inpatient RehabilitationTeam Conference and Plan of Care Update Date: 10/25/2015   Time: 2:00 PM    Patient Name: Randy [name]      Medical Record Number: [id number] Date of Birth: 18-Dec-1931 Sex: Male         Room/Bed: 4M05C/4M05C-01 Payor Info: Payor: [place] / Plan: BCBS MEDICARE / Product Type: *No Product type* /    Admitting Diagnosis: L AKA  Admit Date/Time:  10/23/2015  6:10 PM Admission Comments: No comment available   Primary Diagnosis:  <principal problem not specified> Principal Problem: <principal problem not specified>  Patient Active Problem List   Diagnosis Date Noted  . Adjustment disorder with depressed mood   . Constipation due to pain medication   . Slow transit constipation   . Unilateral complete BKA ([place]) 10/23/2015  . Labile blood pressure   . Muscle stiffness   . S/P AKA (above knee amputation) unilateral ([place])   . History of right above knee amputation ([place])   . Benign essential HTN   . Unilateral AKA ([place]) 10/12/2015  . Abnormality of gait   . Status post above knee amputation of left lower extremity ([place])   . Chronic combined systolic and diastolic congestive heart failure ([place])   . ESRD on dialysis ([place])   . Essential hypertension   . Post-operative pain   . Peripheral vascular disease with pain at rest [facility]) 10/10/2015  . Chest pain 08/04/2015  . PD catheter dysfunction ([place]) 03/13/2015  . Cellulitis and abscess of foot   . Kidney mass   . Other emphysema ([place])   . Acute blood loss anemia   . Physical deconditioning   . S/P BKA (below knee amputation) unilateral ([place])   . Renal mass 01/01/2015  . Foot pain 12/30/2014  . Foot pain, right 12/30/2014  . CHF (congestive heart failure) ([place]) 12/30/2014  . CAD (coronary artery disease) 12/30/2014  . Hyperkalemia 12/30/2014  . ESRD (end stage renal disease) on dialysis ([place])   . Blood loss anemia   . Lower GI bleed 09/21/2014  . Fluid overload 08/29/2014  . Acute on  chronic combined systolic and diastolic CHF (congestive heart failure) ([place]) 08/29/2014  . Acute respiratory failure with hypoxia ([place]) 08/29/2014  . Acute respiratory failure with hypoxemia ([place]) 08/29/2014  . Acute myopericarditis 08/18/2014  . Elevated troponin 08/18/2014  . Anemia of chronic disease   . End stage renal disease, has been peritoneal, now hemodialysis 07/28/2012  . Secondary cardiomyopathy ([place]) 06/26/2012  . HLD (hyperlipidemia) 12/10/2006  . Hypertensive heart disease 08/18/2006  . OSTEOARTHRITIS 08/18/2006  . Polycystic kidney 08/18/2006    Expected Discharge Date: Expected Discharge Date: 10/31/15  Team Members Present: Physician leading conference: Dr. Delice Lesch Social Worker Present: Alfonse Alpers, LCSW Nurse Present: Dorien Chihuahua, RN PT Present: [name], PT OT Present: Clyda Greener, OT SLP Present: Windell Moulding, SLP PPS Coordinator present : Daiva Nakayama, RN, CRRN     Current Status/Progress Goal Weekly Team Focus  Medical   Decreased mobility and self care secondary to Left AKA with prior hx of R BKA readmitted after chest pain with constipation  Improve transfers, mobility, endurance, improve BMs  see above   Bowel/Bladder   cont of bowel /oliguric  remain continent of B+B  monitor for incontinence of bowel and need for d/c senokot   Swallow/Nutrition/ Hydration     na        ADL's   Min A UB bathing/dressing, Mod A LB bathing/dressing, Min-Mod  SB or A/P transfers, Mod A toileting  Overall Supervision for BADL and transfers  Activity tolerance, toileting, AE/DME training, UE strengthing, family education   Mobility   Mod-max A bed mobility, basic transfers.  Supervision w/c mobility short distances-pt limited by fatigue  Supervision transfers, w/c mobility, min A w/c up/down ramp  Activity tolerance/endurance, strengthening, balance, safety with transfers.   Communication     na        Safety/Cognition/ Behavioral Observations    no unsafe  behaviors        Pain   using ocy 10 mg asking for something ess strength  pain controled with prn med and less frequency of administration  access need for and effectiveness of oxy 5mg  (decreased dose of med)   Skin   incision left stump, no drainage. MASD to buttocks with foam applied  new new skin issues and healing of masd/incision  assess treatment for masd and healing of incision    Rehab Goals Patient on target to meet rehab goals: Yes Rehab Goals Revised: some goals were changed when pt returned to CIR from acute - minimal assistance for car transfers, bathing, toileing, lower body dressing *See Care Plan and progress notes for long and short-term goals.  Barriers to Discharge: Labile HTN, safety, bowel reg    Possible Resolutions to Barriers:  cont to monitor BP meds, optimize pain meds, family meds    Discharge Planning/Teaching Needs:  Pt plans to return to his home with his wife, children, and niece to assist him at home.  Pt's family is planning to start family education this week.   Team Discussion:  Pt was having problems with constipation, but has had some relief from that.  Pt is asking for a different pain medication that may not be as constipating, but Dr. Posey Pronto said there is not any other good options, due to his cardiac and kidney issues.  Pt is progressing with PT to mod assist for balance and transfers.  OT said pt is at min assist with upper body and mod for toileting.  Family will need to come in for family education.  Goals are supervision - min assist w/c level.  PT needs to talk to family about pt's w/c at home.  Pt may need a 30" slideboard.  Revisions to Treatment Plan:  Home Tuesday 2/7   Continued Need for Acute Rehabilitation Level of Care: The patient requires daily medical management by a physician with specialized training in physical medicine and rehabilitation for the following conditions: Daily direction of a multidisciplinary physical rehabilitation  program to ensure safe treatment while eliciting the highest outcome that is of practical value to the patient.: Yes Daily medical management of patient stability for increased activity during participation in an intensive rehabilitation regime.: Yes Daily analysis of laboratory values and/or radiology reports with any subsequent need for medication adjustment of medical intervention for : Post surgical problems;Wound care problems;Blood pressure problems;Renal problems  Laurell Coalson, Silvestre Mesi 10/26/2015, 7:08 PM

## 2015-10-26 NOTE — Progress Notes (Signed)
Physical Therapy Session Note  Patient Details  Name: Randy [name] MRN: [id number] Date of Birth: 1931-11-05  Today's Date: 10/26/2015 PT Individual Time: 0800-0900 and [id number]  PT Individual Time Calculation (min): 60 min and 38 min   Short Term Goals: Week 1:  PT Short Term Goal 1 (Week 1): pt will perform functional transfers with min A PT Short Term Goal 2 (Week 1): pt will demo dyanmic sitting balance for functional task with min A  Skilled Therapeutic Interventions/Progress Updates:   Treatment 1: Patient received in bed, agreeable to OOB activity with increased time. Patient transferred to edge of bed using bed rail with HOB elevated to don liner with supervision and R prosthesis with max-total A. Patient required assistance for safe placement of slide board and performed lateral scoot transfer via slide board with prosthesis donned bed > wheelchair with supervision/setup assist and verbal cues for head hips relationship, hand placement, and decreasing shear on buttocks. Patient sat in wheelchair at sink for washing face, UB and LB bathing with setup assist. Patient performed UB dressing in wheelchair with setup assist and LB dressing with assist for threading underwear/pants over prosthesis and patient performed lateral leans to pull up underwear/pants and assist required to completely pull up underwear pants while patient pushing up from wheelchair cushion using arm rests. Patient's L residual limb re-wrapped and patient left sitting in wheelchair with quick release belt on and needs within reach.   Treatment 2: Patient in bed with daughter and cousin present for session. Patient declined donning prosthesis this session, therefore performed anterior posterior transfers from bed > wheelchair <> mat table with supervision overall and max verbal cues for sequencing and technique for hip clearance to prevent further skin breakdown. Patient propelled wheelchair using BUE 2 x 150 ft with  supervision and with encouragement and min cues he was able to don/doff R amputee support pad. Prone/sidelying therex for RLE muscle extensibility and strengthening: prone on elbows with pillow under chest for R hip flexor stretch, prone R hip extension 2 x 10 with manual cues to decrease compensatory lumbar extension, sidelying R hip abduction 2 x 10, sidelying R hip extension 2 x 10. Patient performed rolling R and L with supervision, min A for supine <> prone, and min A with verbal/tactile cues for sequencing sit <> supine on flat mat table. Patient's daughter repeatedly stating throughout session that patient needed a bed rail to pull up on "to keep from aggravating him" with rolling and bed mobility, when PT asked patient if this aggravated him he stated it did not and PT provided education to patient and daughter on therapeutic benefits of functional mobility for strengthening and activity tolerance. Seated edge of mat, performed pushups on yoga blocks 3 x 5, when cued to perform anterior weight shift, patient demonstrated improved hip clearance. Patient stated he was "done" with therapy, transferred back to wheelchair and propelled back to room as above and left sitting in wheelchair with quick release belt on and needs within reach, family present.   Therapy Documentation Precautions:  Precautions Precautions: Fall Precaution Comments: recent Lt AKA, h/o Rt BKA Restrictions Weight Bearing Restrictions: Yes LLE Weight Bearing: Non weight bearing Pain: Pain Assessment Pain Assessment: No/denies pain   See Function Navigator for Current Functional Status.   Therapy/Group: Individual Therapy  Randy [name] 10/26/2015, 9:40 AM

## 2015-10-26 NOTE — Plan of Care (Signed)
Problem: RH BOWEL ELIMINATION Goal: RH STG MANAGE BOWEL W/MEDICATION W/ASSISTANCE STG Manage Bowel with Medication with min Assistance.  Outcome: Not Progressing LBM today with soaps suds enema and disimpaction   Problem: RH SKIN INTEGRITY Goal: RH STG MAINTAIN SKIN INTEGRITY WITH ASSISTANCE STG Maintain Skin Integrity With min Assistance.  Outcome: Not Progressing Requires staff to turn him in bed q2 hours  Goal: RH STG ABLE TO PERFORM INCISION/WOUND CARE W/ASSISTANCE STG Able To Perform Incision/Wound Care With min Assistance.  Outcome: Not Progressing Total care still. Pt has not attempted dressing change yet

## 2015-10-27 ENCOUNTER — Inpatient Hospital Stay (HOSPITAL_COMMUNITY): Payer: Medicare Other

## 2015-10-27 ENCOUNTER — Inpatient Hospital Stay (HOSPITAL_COMMUNITY): Payer: Medicare Other | Admitting: Physical Therapy

## 2015-10-27 ENCOUNTER — Inpatient Hospital Stay (HOSPITAL_COMMUNITY): Payer: Medicare Other | Admitting: Occupational Therapy

## 2015-10-27 LAB — RENAL FUNCTION PANEL
ALBUMIN: 2.3 g/dL — AB (ref 3.5–5.0)
ANION GAP: 17 — AB (ref 5–15)
BUN: 56 mg/dL — ABNORMAL HIGH (ref 6–20)
CALCIUM: 8.8 mg/dL — AB (ref 8.9–10.3)
CO2: 24 mmol/L (ref 22–32)
Chloride: 94 mmol/L — ABNORMAL LOW (ref 101–111)
Creatinine, Ser: 8.62 mg/dL — ABNORMAL HIGH (ref 0.61–1.24)
GFR, EST AFRICAN AMERICAN: 6 mL/min — AB (ref >60)
GFR, EST NON AFRICAN AMERICAN: 5 mL/min — AB (ref >60)
GLUCOSE: 116 mg/dL — AB (ref 65–99)
PHOSPHORUS: 6.2 mg/dL — AB (ref 2.5–4.6)
POTASSIUM: 3.7 mmol/L (ref 3.5–5.1)
SODIUM: 135 mmol/L (ref 135–145)

## 2015-10-27 LAB — CBC
HEMATOCRIT: 30.6 % — AB (ref 39.0–52.0)
HEMOGLOBIN: 9.2 g/dL — AB (ref 13.0–17.0)
MCH: 29.2 pg (ref 26.0–34.0)
MCHC: 30.1 g/dL (ref 30.0–36.0)
MCV: 97.1 fL (ref 78.0–100.0)
Platelets: 294 10*3/uL (ref 150–400)
RBC: 3.15 MIL/uL — AB (ref 4.22–5.81)
RDW: 18.8 % — ABNORMAL HIGH (ref 11.5–15.5)
WBC: 6.3 10*3/uL (ref 4.0–10.5)

## 2015-10-27 MED ORDER — AMLODIPINE BESYLATE 5 MG PO TABS
5.0000 mg | ORAL_TABLET | Freq: Every day | ORAL | Status: DC
  Administered 2015-10-27 – 2015-10-30 (×4): 5 mg via ORAL
  Filled 2015-10-27 (×4): qty 1

## 2015-10-27 MED ORDER — SENNA 8.6 MG PO TABS
2.0000 | ORAL_TABLET | Freq: Every day | ORAL | Status: DC
Start: 2015-10-27 — End: 2015-10-31
  Administered 2015-10-27 – 2015-10-30 (×4): 17.2 mg via ORAL
  Filled 2015-10-27 (×4): qty 2

## 2015-10-27 MED ORDER — HEPARIN SODIUM (PORCINE) 1000 UNIT/ML DIALYSIS
20.0000 [IU]/kg | INTRAMUSCULAR | Status: DC | PRN
  Filled 2015-10-27: qty 2

## 2015-10-27 MED ORDER — DOCUSATE SODIUM 100 MG PO CAPS
100.0000 mg | ORAL_CAPSULE | Freq: Three times a day (TID) | ORAL | Status: DC
  Administered 2015-10-27 – 2015-10-30 (×9): 100 mg via ORAL
  Filled 2015-10-27 (×9): qty 1

## 2015-10-27 NOTE — Progress Notes (Signed)
Occupational Therapy Session Note  Patient Details  Name: Randy [name] MRN: [id number] Date of Birth: [birth year]  Today's Date: 10/27/2015 OT Individual Time: 0730-0830 OT Individual Time Calculation (min): 60 min   Short Term Goals: Week 2:  OT Short Term Goal 1 (Week 2): Continue week 1 goals secondary to interrupted stay  Skilled Therapeutic Interventions/Progress Updates: ADL-retraining with focus on toilet transfer, toileting, and BADL at EOB.   Pt received supine in bed requesting setup for self-feeding in w/c.   OT provides slide board and min assist for setup with only steadying needed as pt completes transfer to his right side.  Pt setup for self-feeding and required no assist to consume light breakfast.  Pt notes need to use toilet for BM and after min assist to problem-solves elects to use drop-arm BSC placed beside bed.   Pt completes transfer on to [facility] with mod assist, is productive for large BM, and performs toilet hygiene using lateral leans toward EOB with 90% thoroughness while wiping himself.    Pt then completes transfer from [facility] to EOB and completes BADL sitting and supine with overall just Min assist to bathe and dress.   Pt left supine in bed at end of session w/o complaint of fatigue and with all needs placed within reach.     Therapy Documentation Precautions:  Precautions Precautions: Fall Precaution Comments: recent Lt AKA, h/o Rt BKA Restrictions Weight Bearing Restrictions: Yes LLE Weight Bearing: Non weight bearing   Vital Signs: Therapy Vitals Temp: 98.6 F (37 C) Temp Source: Oral Pulse Rate: 78 Resp: 17 BP: 134/61 mmHg Patient Position (if appropriate): Lying Oxygen Therapy SpO2: 96 % O2 Device: Not Delivered   Pain: Pain Assessment Pain Assessment: No/denies pain   See Function Navigator for Current Functional Status.   Therapy/Group: Individual Therapy  [place] 10/27/2015, 8:27 AM

## 2015-10-27 NOTE — Plan of Care (Signed)
Problem: RH PAIN MANAGEMENT Goal: RH STG PAIN MANAGED AT OR BELOW PT'S PAIN GOAL < 4  Outcome: Not Progressing Reports pain 6

## 2015-10-27 NOTE — Progress Notes (Signed)
Subjective:   No new complaints- found out that he will be going home on Tuesday   Objective Vital signs in last 24 hours: Filed Vitals:   10/26/15 1454 10/26/15 2217 10/27/15 0552 10/27/15 0935  BP: 112/61 90/50 134/61 120/47  Pulse: 77 72 78 76  Temp: 97.8 F (36.6 C)  98.6 F (37 C)   TempSrc: Oral  Oral   Resp: 18  17   Weight:   58.1 kg (128 lb 1.4 oz)   SpO2: 98%  96% 98%   Weight change: -3.1 kg (-6 lb 13.3 oz) Physical Exam: General=Alert  Calm , NAD Chest = CTA bilat Card =RRR no m. r. g. Abd= soft nt nd  Ext =L AKA and R BKA,No edema HD access= pos. Bruit LFA AVF  OP HD= [place] MWF 3.5h 62kg 2/2.25 bath LFA AVF Hep 6600  Mircera 100 q 2wks last 1/11 Calc 0.5 ug:   Problem/Plan: 1 SP L AKA 1/17= brief acute hosp for CP- medical management- now back in rehab 2 ESRD =MWF via AVF- plan next for today  3 Vol / HTN =4kg under dry - will need lower at discharge- also on 3 BP meds- amlodipine/coreg and lisinopril low dose- actually will take amlodipine down so will still get heart benefits from coreg and lisinopril 4 MBD= correc. ca ^  = on calcitriol / phos  5.2  on Renvela  2400 ac 5 Anemia = hgb 9.3-->8.5  cont esa- sat 21 and ferr 1200 but probably needs iron- ordered with HD 6. CP- think plan is for medical management and to keep BP down - imdur/amlod/coreg 25/lisinopril 10- unclear if will tolerate all those agents - taking amlodipine down to 5 today   Randy [name]   10/27/2015, 10:28 AM    Labs: Basic Metabolic Panel:  Recent Labs Lab 10/20/15 1600 10/22/15 0542 10/23/15 1200 10/25/15 1628  NA 135 134* 135 133*  K 4.8 4.0 4.8 4.1  CL 94* 101 92* 92*  CO2 28 24 27 25   GLUCOSE 162* 277* 114* 170*  BUN 57* 14 78* 55*  CREATININE 7.60* 1.00 9.78* 8.18*  CALCIUM 8.9 8.4* 9.1 8.7*  PHOS 5.8*  --  5.8* 5.2*   Liver Function Tests:  Recent Labs Lab 10/20/15 1600 10/23/15 1200 10/25/15 1628  ALBUMIN 1.9* 2.0* 2.1*   No  results for input(s): LIPASE, AMYLASE in the last 168 hours. No results for input(s): AMMONIA in the last 168 hours. CBC:  Recent Labs Lab 10/22/15 0728 10/23/15 1200 10/25/15 1628  WBC 5.5 5.6 8.5  HGB 8.7* 8.8* 8.5*  HCT 29.1* 28.1* 27.8*  MCV 98.0 96.2 96.9  PLT 267 291 298   Cardiac Enzymes:  Recent Labs Lab 10/21/15 0936 10/21/15 1350 10/21/15 1800 10/21/15 2300  TROPONINI 0.10* 0.24* 0.31* 0.29*   CBG: No results for input(s): GLUCAP in the last 168 hours.  Studies/Results: No results found. Medications:   . amLODipine  10 mg Oral QHS  . aspirin  81 mg Oral Daily  . atorvastatin  40 mg Oral q1800  . bisacodyl  10 mg Rectal Once  . brinzolamide  1 drop Both Eyes BID   And  . brimonidine  1 drop Both Eyes BID  . calcitRIOL  0.5 mcg Oral Daily  . carvedilol  25 mg Oral BID WC  . darbepoetin (ARANESP) injection - DIALYSIS  200 mcg Intravenous Q Wed-HD  . docusate sodium  100 mg Oral TID  . enoxaparin (LOVENOX) injection  30  mg Subcutaneous Q24H  . feeding supplement  1 Container Oral TID BM  . ferric gluconate (FERRLECIT/NULECIT) IV  125 mg Intravenous Q M,W,F-HD  . gabapentin  100 mg Oral QHS  . isosorbide mononitrate  30 mg Oral Daily  . latanoprost  1 drop Both Eyes QHS  . lisinopril  10 mg Oral Daily  . multivitamin  1 tablet Oral QHS  . naphazoline-pheniramine  1-2 drop Both Eyes BID  . pantoprazole  40 mg Oral Q1200  . senna  2 tablet Oral QHS  . sevelamer carbonate  1,600 mg Oral With snacks  . sevelamer carbonate  2,400 mg Oral TID WC

## 2015-10-27 NOTE — Progress Notes (Signed)
Occupational Therapy Session Note  Patient Details  Name: Randy [name] MRN: [id number] Date of Birth: 04/21/1932  Today's Date: 10/27/2015 OT Individual Time:  -    1300-1400  (60 min)       Short Term Goals: Week 1:    Week 2:  OT Short Term Goal 1 (Week 2): Continue week 1 goals secondary to interrupted stay Week 3:     Skilled Therapeutic Interventions/Progress Updates:    Transferred bed to wc>mat>wc with set up and SBA.  Pt propelled wc to gym with no assist on level surface.  Reaching  Activities for balance and strength.  Engaged in UE ball toss for 4 min, 3, min, 44min;  Pt sat unsupported on mat for BUE exercises.  Did dynamic sitting and reaching in all directions.    Transferred back to wc with SBA.  Propelled self back to room and left with all needs in reach.    Therapy Documentation Precautions:  Precautions Precautions: Fall Precaution Comments: recent Lt AKA, h/o Rt BKA Restrictions Weight Bearing Restrictions: Yes LLE Weight Bearing: Non weight bearing General:     Pain:  none            See Function Navigator for Current Functional Status.   Therapy/Group: Individual Therapy  Lisa Roca 10/27/2015, 7:57 AM

## 2015-10-27 NOTE — Progress Notes (Signed)
Randy [name]     PROGRESS NOTE  Subjective/Complaints:  Randy [name] sitting up at the edge of the bed after having completed a good OT session.  He had a BM and feels good this AM.    ROS: Denies LE pain, CP, SOB, n/v/d.  Objective: Vital Signs: Blood pressure 134/61, pulse 78, temperature 98.6 F (37 C), temperature source Oral, resp. rate 17, weight 58.1 kg (128 lb 1.4 oz), SpO2 96 %. No results found.  Recent Labs  10/25/15 1628  WBC 8.5  HGB 8.5*  HCT 27.8*  PLT 298    Recent Labs  10/25/15 1628  NA 133*  K 4.1  CL 92*  GLUCOSE 170*  BUN 55*  CREATININE 8.18*  CALCIUM 8.7*   CBG (last 3)  No results for input(s): GLUCAP in the last 72 hours.  Wt Readings from Last 3 Encounters:  10/27/15 58.1 kg (128 lb 1.4 oz)  10/23/15 61 kg (134 lb 7.7 oz)  10/21/15 59 kg (130 lb 1.1 oz)    Physical Exam:  BP 134/61 mmHg  Pulse 78  Temp(Src) 98.6 F (37 C) (Oral)  Resp 17  Wt 58.1 kg (128 lb 1.4 oz)  SpO2 96% Constitutional: He appears well-developed and well-nourished. NAD HENT: Head: Normocephalic and atraumatic.  Mouth/Throat: Oropharynx is clear and moist.  Eyes: Conjunctivae and EOM are normal.  Cardiovascular: Normal rate and regular rhythm. Murmur heard. Respiratory: Effort normal and breath sounds normal. No stridor. No respiratory distress. He has no wheezes. He has no rales.  GI: Soft. Bowel sounds are normal. He exhibits no distension. There is no tenderness.  Musculoskeletal:  Right BKA well healed no edema, non tender Left AKA with incision c/d/i. No tenderness to palpation.  Neurological: He is alert and oriented.  4+/5 in BUE and R Hip flexor, LLE 4+/5 Skin: Skin is warm.  Psychiatric: He has a normal mood and affect.   Assessment/Plan: 1. Functional deficits secondary to left AKA 10/10/15 with prior hx of R BKA which require 3+ hours per day of interdisciplinary therapy in a comprehensive inpatient rehab  setting. Physiatrist is providing close team supervision and 24 hour management of active medical problems listed below. Physiatrist and rehab team continue to assess barriers to discharge/monitor patient progress toward functional and medical goals.  Function:  Bathing Bathing position   Position: Sitting EOB  Bathing parts Body parts bathed by patient: Right arm, Left arm, Chest, Abdomen, Front perineal area, Buttocks, Right upper leg, Left upper leg, Right lower leg Body parts bathed by helper: Back  Bathing assist Assist Level: Set up   Set up : To obtain items  Upper Body Dressing/Undressing Upper body dressing   What is the patient wearing?: Pull over shirt/dress     Pull over shirt/dress - Perfomed by patient: Thread/unthread right sleeve, Thread/unthread left sleeve, Put head through opening, Pull shirt over trunk          Upper body assist Assist Level: Set up   Set up : To obtain clothing/put away  Lower Body Dressing/Undressing Lower body dressing   What is the patient wearing?: Underwear, Pants Underwear - Performed by patient: Thread/unthread right underwear leg, Thread/unthread left underwear leg, Pull underwear up/down Underwear - Performed by helper: Thread/unthread right underwear leg, Thread/unthread left underwear leg, Pull underwear up/down Pants- Performed by patient: Thread/unthread right pants leg, Thread/unthread left pants leg Pants- Performed by helper: Pull pants up/down  Lower body assist Assist for lower body dressing: Touching or steadying assistance (Randy [name] > 75%)      Toileting Toileting   Toileting steps completed by patient: Adjust clothing prior to toileting, Performs perineal hygiene Toileting steps completed by helper: Adjust clothing after toileting    Toileting assist Assist level: Touching or steadying assistance (Randy [name].75%)   Transfers Chair/bed transfer   Chair/bed transfer method: Lateral scoot Chair/bed  transfer assist level: Set up only Chair/bed transfer assistive device: Armrests, Sliding board     Locomotion Ambulation Ambulation activity did not occur: Safety/medical concerns         Wheelchair   Type: Manual Max wheelchair distance: 150 Assist Level: Supervision or verbal cues  Cognition Comprehension Comprehension assist level: Follows basic conversation/direction with no assist  Expression Expression assist level: Expresses basic needs/ideas: With no assist  Social Interaction Social Interaction assist level: Interacts appropriately 90% of the time - Needs monitoring or encouragement for participation or interaction.  Problem Solving Problem solving assist level: Solves basic 90% of the time/requires cueing < 10% of the time  Memory Memory assist level: Recognizes or recalls 75 - 89% of the time/requires cueing 10 - 24% of the time    Medical Problem List and Plan: 1. Decreased mobility and self care secondary to Left AKA with prior hx of R BKA  Cont CIR 2. DVT Prophylaxis/Anticoagulation: Pharmaceutical: Lovenox 3. Pain Management: Oxycodone IR 4. Mood: monitor for reactive depression 5. Neuropsych: This patient is capable of making decisions on his own behalf. 6. Skin/Wound Care: Dressing changes and ACE wrap to LLE 7. Fluids/Electrolytes/Nutrition: Monitor I/Os, meal intake, may need dietary consult  Eating 80-100% of meals 8. HTN: norvasc, lisinopril, coreg, with prn hydralazine  Under good control at present  Will cont to monitor with therapies and consider adjustment of meds if persistently elevated 9. ESRD: HD managed by Nephro M-W F 10 Anemia of chronic disease: Aranesp weekly in HD  Hb 8.5 on 2/1 11. NISCM/CAD:Lipitor , ASA, Coreg 12.  Constipation  Increased bowel meds on 2/1 with results  Narcotics also decreased  Stool softener increased on 2/3  LOS (Days) 4 A FACE TO FACE EVALUATION WAS PERFORMED  Onyinyechi Huante Lorie Phenix 10/27/2015 8:39 AM

## 2015-10-27 NOTE — Progress Notes (Signed)
Physical Therapy Session Note  Patient Details  Name: Randy [name] MRN: [id number] Date of Birth: [birth year]  Today's Date: 10/27/2015 PT Individual Time: 1010-1108 PT Individual Time Calculation (min): 58 min   Short Term Goals: Week 1:  PT Short Term Goal 1 (Week 1): pt will perform functional transfers with min A PT Short Term Goal 2 (Week 1): pt will demo dyanmic sitting balance for functional task with min A Week 2:  PT Short Term Goal 1 (Week 2): Last weekly note performed on 10/20/15-STG now = LTG due to LOS after interruption of stay  Skilled Therapeutic Interventions/Progress Updates:   Pt received in bed feeling much better today.  Continued discussion with pt and wife (over phone) about D/C planning, home setup, transportation options, equipment and scheduling of family education.  Wife to come on Monday at 10am for therapy sessions.  Also discussed that pt will not be able to stand pivot into tall van and will need to practice transfer in/out of actual sedan size car.  Wife states that w/c at home has both an amputee support pad and leg rests but pt will need a slideboard-Social worker alerted to equipment needs and family ed scheduled.  Wife states bed at home is 28" tall.  Pt's bed elevated to 28"-performed rolling to L side with mod A on flat bed, no rail, and side > sit with mod A.  Performed transfer training w/c <> 28" bed with slideboard downhill with min A and uphill with prosthesis donned for balance and to assist with pushing uphill with mod A.  Also performed simulated sedan car transfer and w/c <> low couch in apartment with slideboard and min A to maintain placement of w/c and slideboard.  Pt able to set up w/c for transfers but still requires assistance to place slideboard and for sequencing of removing arm rest after scooting hips forwards-pt tends to push on drive wheel and slide w/c.  At end of session pt transferred sit > supine with supervision.  Pt left with all items  within reach.    Therapy Documentation Precautions:  Precautions Precautions: Fall Precaution Comments: recent Lt AKA, h/o Rt BKA Restrictions Weight Bearing Restrictions: Yes LLE Weight Bearing: Non weight bearing Vital Signs: Therapy Vitals Pulse Rate: 76 BP: (!) 120/47 mmHg Oxygen Therapy SpO2: 98 % O2 Device: Not Delivered Pain: Pain Assessment Pain Assessment: No/denies pain  See Function Navigator for Current Functional Status.   Therapy/Group: Individual Therapy  [name] Faucette 10/27/2015, 12:01 PM

## 2015-10-27 NOTE — Procedures (Signed)
Patient was seen on dialysis and the procedure was supervised.  BFR 400  Via AVF BP is  107/61.   Patient appears to be tolerating treatment well  Randy [name] A 10/27/2015

## 2015-10-27 NOTE — Progress Notes (Signed)
[employer] contacted Friday afternoon about fit of prosthesis due to pt continuing to report pain at terminal end of residual limb during transfers with prosthesis.  [employer] orthotist to f/u with pt on Monday to assess fit.

## 2015-10-28 ENCOUNTER — Inpatient Hospital Stay (HOSPITAL_COMMUNITY): Payer: Medicare Other | Admitting: Occupational Therapy

## 2015-10-28 ENCOUNTER — Inpatient Hospital Stay (HOSPITAL_COMMUNITY): Payer: Medicare Other | Admitting: Physical Therapy

## 2015-10-28 DIAGNOSIS — F4321 Adjustment disorder with depressed mood: Secondary | ICD-10-CM

## 2015-10-28 NOTE — Progress Notes (Signed)
Occupational Therapy Session Note  Patient Details  Name: Randy [name] MRN: [id number] Date of Birth: Oct 13, 1931  Today's Date: 10/28/2015 OT Individual Time: [id number] OT Individual Time Calculation (min): 45 min  and Today's Date: 10/28/2015 OT Missed Time: 30 Minutes Missed Time Reason: Patient fatigue   Short Term Goals: Week 2:  OT Short Term Goal 1 (Week 2): Continue week 1 goals secondary to interrupted stay  Skilled Therapeutic Interventions/Progress Updates:  Upon entering the room, pt with no c/o pain this session. Pt requiring encouragement and coaxing for participation this morning. Pt performing UB bathing and dressing while seated on EOB with set up A. Pt declined LB self care this session. Pt seated on EOB with supervision for safety with balance while eating from breakfast tray. OT educated pt on discharge recommendation for [place]. Pt asking questions as appropriate in order to understand recommendations. Pt declined further treatment and stating, "Okay, I need to rest now." OT educating pt but he continues to decline. Call bell and all needed items within reach upon exiting the room.   Therapy Documentation Precautions:  Precautions Precautions: Fall Precaution Comments: recent Lt AKA, h/o Rt BKA Restrictions Weight Bearing Restrictions: Yes LLE Weight Bearing: Non weight bearing General: General OT Amount of Missed Time: 30 Minutes Vital Signs: Therapy Vitals Temp: 98.4 F (36.9 C) Temp Source: Oral Pulse Rate: 76 Resp: 17 BP: (!) 145/68 mmHg Patient Position (if appropriate): Lying Oxygen Therapy SpO2: 96 % O2 Device: Not Delivered  See Function Navigator for Current Functional Status.   Therapy/Group: Individual Therapy  [name] 10/28/2015, 7:55 AM

## 2015-10-28 NOTE — Progress Notes (Signed)
Physical Therapy Note  Patient Details  Name: Randy [name] MRN: [id number] Date of Birth: July 04, 1932 Today's Date: 10/28/2015  Patient missed 30 min skilled physical therapy due refusal to participate due to pain in RLE. Patient premedicated as therapist had attempted to see patient earlier and notified RN of request for pain medication. Patient declined bed level exercises. Will f/u as able.    Carney Living A 10/28/2015, 3:19 PM

## 2015-10-28 NOTE — Progress Notes (Signed)
[place] PHYSICAL MEDICINE & REHABILITATION     PROGRESS NOTE  Subjective/Complaints:   Waiting for breakfast ROS: Denies LE pain, CP, SOB, n/v/d.  Objective: Vital Signs: Blood pressure 145/68, pulse 76, temperature 98.4 F (36.9 C), temperature source Oral, resp. rate 17, weight 56.4 kg (124 lb 5.4 oz), SpO2 96 %. No results found.  Recent Labs  10/25/15 1628 10/27/15 1439  WBC 8.5 6.3  HGB 8.5* 9.2*  HCT 27.8* 30.6*  PLT 298 294    Recent Labs  10/25/15 1628 10/27/15 1438  NA 133* 135  K 4.1 3.7  CL 92* 94*  GLUCOSE 170* 116*  BUN 55* 56*  CREATININE 8.18* 8.62*  CALCIUM 8.7* 8.8*   CBG (last 3)  No results for input(s): GLUCAP in the last 72 hours.  Wt Readings from Last 3 Encounters:  10/28/15 56.4 kg (124 lb 5.4 oz)  10/23/15 61 kg (134 lb 7.7 oz)  10/21/15 59 kg (130 lb 1.1 oz)    Physical Exam:  BP 145/68 mmHg  Pulse 76  Temp(Src) 98.4 F (36.9 C) (Oral)  Resp 17  Wt 56.4 kg (124 lb 5.4 oz)  SpO2 96% Constitutional: He appears well-developed and well-nourished. NAD HENT: Head: Normocephalic and atraumatic.  Mouth/Throat: Oropharynx is clear and moist.  Eyes: Conjunctivae and EOM are normal.  Cardiovascular: Normal rate and regular rhythm. Murmur heard. Respiratory: Effort normal and breath sounds normal. No stridor. No respiratory distress. He has no wheezes. He has no rales.  GI: Soft. Bowel sounds are normal. He exhibits no distension. There is no tenderness.  Musculoskeletal:  Right BKA well healed no edema, non tender Left AKA with drsg Neurological: He is alert and oriented.  4+/5 in BUE and R Hip flexor, LLE 4+/5 Skin: Skin is warm.  Psychiatric: He has a normal mood and affect.   Assessment/Plan: 1. Functional deficits secondary to left AKA 10/10/15 with prior hx of R BKA which require 3+ hours per day of interdisciplinary therapy in a comprehensive inpatient rehab setting. Physiatrist is providing close team supervision and  24 hour management of active medical problems listed below. Physiatrist and rehab team continue to assess barriers to discharge/monitor patient progress toward functional and medical goals.  Function:  Bathing Bathing position   Position: Sitting EOB  Bathing parts Body parts bathed by patient: Right arm, Left arm, Chest, Abdomen, Front perineal area, Buttocks, Right upper leg, Left upper leg, Right lower leg Body parts bathed by helper: Back  Bathing assist Assist Level: Set up   Set up : To obtain items  Upper Body Dressing/Undressing Upper body dressing   What is the patient wearing?: Pull over shirt/dress     Pull over shirt/dress - Perfomed by patient: Thread/unthread right sleeve, Thread/unthread left sleeve, Put head through opening, Pull shirt over trunk          Upper body assist Assist Level: Set up   Set up : To obtain clothing/put away  Lower Body Dressing/Undressing Lower body dressing   What is the patient wearing?: Underwear, Pants Underwear - Performed by patient: Thread/unthread right underwear leg, Thread/unthread left underwear leg, Pull underwear up/down Underwear - Performed by helper: Thread/unthread right underwear leg, Thread/unthread left underwear leg, Pull underwear up/down Pants- Performed by patient: Thread/unthread right pants leg, Thread/unthread left pants leg Pants- Performed by helper: Pull pants up/down                      Lower body assist Assist for  lower body dressing: Touching or steadying assistance (Pt > 75%)      Toileting Toileting   Toileting steps completed by patient: Adjust clothing prior to toileting, Performs perineal hygiene Toileting steps completed by helper: Adjust clothing after toileting    Toileting assist Assist level: Touching or steadying assistance (Pt.75%)   Transfers Chair/bed transfer   Chair/bed transfer method: Lateral scoot Chair/bed transfer assist level: Moderate assist (Pt 50 - 74%/lift or  lower) Chair/bed transfer assistive device: Sliding board, Prosthesis     Locomotion Ambulation Ambulation activity did not occur: Safety/medical concerns         Wheelchair   Type: Manual Max wheelchair distance: 150 Assist Level: No help, No cues, assistive device, takes more than reasonable amount of time  Cognition Comprehension Comprehension assist level: Follows basic conversation/direction with no assist  Expression Expression assist level: Expresses basic needs/ideas: With no assist  Social Interaction Social Interaction assist level: Interacts appropriately 90% of the time - Needs monitoring or encouragement for participation or interaction.  Problem Solving Problem solving assist level: Solves basic 90% of the time/requires cueing < 10% of the time  Memory Memory assist level: Recognizes or recalls 75 - 89% of the time/requires cueing 10 - 24% of the time    Medical Problem List and Plan: 1. Decreased mobility and self care secondary to Left AKA with prior hx of R BKA  Cont CIR 2. DVT Prophylaxis/Anticoagulation: Pharmaceutical: Lovenox 3. Pain Management: Oxycodone IR 4. Mood: monitor for reactive depression 5. Neuropsych: This patient is capable of making decisions on his own behalf. 6. Skin/Wound Care: Dressing changes and ACE wrap to LLE 7. Fluids/Electrolytes/Nutrition: Monitor I/Os, meal intake, does not  need dietary consult  Eating 80-100% of meals 8. HTN: norvasc, lisinopril, coreg, with prn hydralazine  145/78, cont current meds 9. ESRD: HD managed by Nephro M-W F 10 Anemia of chronic disease: Aranesp weekly in HD  Hb 8.5 on 2/1 11. NISCM/CAD:Lipitor , ASA, Coreg 12.  Constipation-improved lg BM 2/3  Increased bowel meds on 2/1  Narcotics also decreased  Stool softener increased on 2/3  LOS (Days) 5 A FACE TO FACE EVALUATION WAS PERFORMED  Charlett Blake 10/28/2015 6:52 AM

## 2015-10-28 NOTE — Progress Notes (Signed)
Subjective:   No new complaints-  HD yest- removed 3000 tolerated well  Objective Vital signs in last 24 hours: Filed Vitals:   10/27/15 2031 10/28/15 0500 10/28/15 0603 10/28/15 0900  BP: 136/55  145/68 146/70  Pulse:   76   Temp:   98.4 F (36.9 C)   TempSrc:   Oral   Resp:   17   Weight:  56.4 kg (124 lb 5.4 oz)    SpO2:   96%    Weight change: 2.1 kg (4 lb 10.1 oz) Physical Exam: General=Alert  Calm , NAD Chest = CTA bilat Card =RRR no m. r. g. Abd= soft nt nd  Ext =L AKA and R BKA,No edema HD access= pos. Bruit LFA AVF  OP HD= [facility] MWF 3.5h 62kg 2/2.25 bath LFA AVF Hep 6600  Mircera 100 q 2wks last 1/11 Calc 0.5 ug:   Problem/Plan: 1 SP L AKA 1/17= brief acute hosp for CP- medical management- now back in rehab 2 ESRD =MWF via AVF- plan next for Monday 3 Vol / HTN =4kg under dry - will need lower at discharge- also on 3 BP meds- amlodipine/coreg and lisinopril low dose- actually will take amlodipine down so will still get heart benefits from coreg and lisinopril 4 MBD= correc. ca ^  = on calcitriol / phos  6.2  on Renvela  2400 ac 5 Anemia = hgb 9.3-->9.2  cont esa- sat 21 and ferr 1200 but probably needs iron- ordered with HD 6. CP- think plan is for medical management and to keep BP down - imdur/amlod 5/coreg 25/lisinopril 10- unclear if will tolerate all those agents - amlod taken down this hosp  I will not see patient tomorrow- Sunday- will revisit on Monday- call me if issues tomorrow   Immaculate Crutcher A   10/28/2015, 10:49 AM    Labs: Basic Metabolic Panel:  Recent Labs Lab 10/23/15 1200 10/25/15 1628 10/27/15 1438  NA 135 133* 135  K 4.8 4.1 3.7  CL 92* 92* 94*  CO2 27 25 24   GLUCOSE 114* 170* 116*  BUN 78* 55* 56*  CREATININE 9.78* 8.18* 8.62*  CALCIUM 9.1 8.7* 8.8*  PHOS 5.8* 5.2* 6.2*   Liver Function Tests:  Recent Labs Lab 10/23/15 1200 10/25/15 1628 10/27/15 1438  ALBUMIN 2.0* 2.1* 2.3*   No results for  input(s): LIPASE, AMYLASE in the last 168 hours. No results for input(s): AMMONIA in the last 168 hours. CBC:  Recent Labs Lab 10/22/15 0728 10/23/15 1200 10/25/15 1628 10/27/15 1439  WBC 5.5 5.6 8.5 6.3  HGB 8.7* 8.8* 8.5* 9.2*  HCT 29.1* 28.1* 27.8* 30.6*  MCV 98.0 96.2 96.9 97.1  PLT 267 291 298 294   Cardiac Enzymes:  Recent Labs Lab 10/21/15 1350 10/21/15 1800 10/21/15 2300  TROPONINI 0.24* 0.31* 0.29*   CBG: No results for input(s): GLUCAP in the last 168 hours.  Studies/Results: No results found. Medications:   . amLODipine  5 mg Oral QHS  . aspirin  81 mg Oral Daily  . atorvastatin  40 mg Oral q1800  . bisacodyl  10 mg Rectal Once  . brinzolamide  1 drop Both Eyes BID   And  . brimonidine  1 drop Both Eyes BID  . calcitRIOL  0.5 mcg Oral Daily  . carvedilol  25 mg Oral BID WC  . darbepoetin (ARANESP) injection - DIALYSIS  200 mcg Intravenous Q Wed-HD  . docusate sodium  100 mg Oral TID  . enoxaparin (LOVENOX) injection  30  mg Subcutaneous Q24H  . feeding supplement  1 Container Oral TID BM  . ferric gluconate (FERRLECIT/NULECIT) IV  125 mg Intravenous Q M,W,F-HD  . gabapentin  100 mg Oral QHS  . isosorbide mononitrate  30 mg Oral Daily  . latanoprost  1 drop Both Eyes QHS  . lisinopril  10 mg Oral Daily  . multivitamin  1 tablet Oral QHS  . naphazoline-pheniramine  1-2 drop Both Eyes BID  . pantoprazole  40 mg Oral Q1200  . senna  2 tablet Oral QHS  . sevelamer carbonate  1,600 mg Oral With snacks  . sevelamer carbonate  2,400 mg Oral TID WC

## 2015-10-28 NOTE — Progress Notes (Signed)
Occupational Therapy Session Note  Patient Details  Name: Randy [name] MRN: [id number] Date of Birth: [birth year]  Today's Date: 10/28/2015 OT Individual Time:  -   1.1015-1100  (45 min)                                        1400-1440  (40 min)        Short Term Goals: Week 1:    Week 2:  OT Short Term Goal 1 (Week 2): Continue week 1 goals secondary to interrupted stay :     Skilled Therapeutic Interventions/Progress Updates:     1st session:  Engaged in bathing and dressing EOB level.  Session focused on bed mobility, supine to sit and sitting balance.  Pt performed rolling with min assist for reaching pants in back.  He was SBA with bed mobility and supine to sit and sitting balance.  Pt refused to transfer to wc due to fatigue.  2nd session:  Pt c/o Left leg pain at end of surgical incision.  Pain= 10/10.  Provided education on massaging to alleviate pain.  Provided manual massage to leg.  Pt stated his pain was shooting in his leg.  Left pt in bed at end of session with all needs in reach.   Therapy Documentation Precautions:  Precautions Precautions: Fall Precaution Comments: recent Lt AKA, h/o Rt BKA Restrictions Weight Bearing Restrictions: Yes LLE Weight Bearing: Non weight bearing   Pain:  4/10  1st session            10/10 2nd session         See Function Navigator for Current Functional Status.   Therapy/Group: Individual Therapy  Lisa Roca 10/28/2015, 7:52 AM

## 2015-10-29 ENCOUNTER — Inpatient Hospital Stay (HOSPITAL_COMMUNITY): Payer: Medicare Other | Admitting: Occupational Therapy

## 2015-10-29 NOTE — Progress Notes (Signed)
R[name] PHYSICAL MEDICINE & REHABILITATION     PROGRESS NOTE  Subjective/Complaints:  Appreciate Nephro note, no c/os ROS: Denies LE pain, CP, SOB, n/v/d.  Objective: Vital Signs: Blood pressure 144/58, pulse 73, temperature 98.6 F (37 C), temperature source Oral, resp. rate 17, weight 58.2 kg (128 lb 4.9 oz), SpO2 96 %. No results found.  Recent Labs  10/27/15 1439  WBC 6.3  HGB 9.2*  HCT 30.6*  PLT 294    Recent Labs  10/27/15 1438  NA 135  K 3.7  CL 94*  GLUCOSE 116*  BUN 56*  CREATININE 8.62*  CALCIUM 8.8*   CBG (last 3)  No results for input(s): GLUCAP in the last 72 hours.  Wt Readings from Last 3 Encounters:  10/29/15 58.2 kg (128 lb 4.9 oz)  10/23/15 61 kg (134 lb 7.7 oz)  10/21/15 59 kg (130 lb 1.1 oz)    Physical Exam:  BP 144/58 mmHg  Pulse 73  Temp(Src) 98.6 F (37 C) (Oral)  Resp 17  Wt 58.2 kg (128 lb 4.9 oz)  SpO2 96% Constitutional: He appears well-developed and well-nourished. NAD HENT: Head: Normocephalic and atraumatic.  Mouth/Throat: Oropharynx is clear and moist.  Eyes: Conjunctivae and EOM are normal.  Cardiovascular: Normal rate and regular rhythm. Murmur heard. Respiratory: Effort normal and breath sounds normal. No stridor. No respiratory distress. He has no wheezes. He has no rales.  GI: Soft. Bowel sounds are normal. He exhibits no distension. There is no tenderness.  Musculoskeletal:  Right BKA well healed no edema, non tender Left AKA with drsg Neurological: He is alert and oriented.  4+/5 in BUE and 4/5 B HF Skin: Skin is warm.  Psychiatric: He has a normal mood and affect.   Assessment/Plan: 1. Functional deficits secondary to left AKA 10/10/15 with prior hx of R BKA which require 3+ hours per day of interdisciplinary therapy in a comprehensive inpatient rehab setting. Physiatrist is providing close team supervision and 24 hour management of active medical problems listed below. Physiatrist and rehab team  continue to assess barriers to discharge/monitor patient progress toward functional and medical goals.  Function:  Bathing Bathing position   Position: Sitting EOB  Bathing parts Body parts bathed by patient: Right arm, Left arm, Chest, Abdomen, Front perineal area, Buttocks, Right upper leg, Left upper leg Body parts bathed by helper: Back  Bathing assist Assist Level: Set up   Set up : To obtain items  Upper Body Dressing/Undressing Upper body dressing   What is the patient wearing?: Pull over shirt/dress     Pull over shirt/dress - Perfomed by patient: Thread/unthread right sleeve, Thread/unthread left sleeve, Put head through opening, Pull shirt over trunk          Upper body assist Assist Level: Set up   Set up : To obtain clothing/put away  Lower Body Dressing/Undressing Lower body dressing   What is the patient wearing?: Underwear, Pants Underwear - Performed by patient: Thread/unthread right underwear leg, Thread/unthread left underwear leg, Pull underwear up/down Underwear - Performed by helper: Thread/unthread right underwear leg, Thread/unthread left underwear leg, Pull underwear up/down Pants- Performed by patient: Thread/unthread right pants leg, Thread/unthread left pants leg, Pull pants up/down Pants- Performed by helper: Pull pants up/down                      Lower body assist Assist for lower body dressing: Touching or steadying assistance (Pt > 75%)      Toileting  Toileting   Toileting steps completed by patient: Adjust clothing prior to toileting, Performs perineal hygiene Toileting steps completed by helper: Adjust clothing after toileting    Toileting assist Assist level: Touching or steadying assistance (Pt.75%)   Transfers Chair/bed transfer   Chair/bed transfer method: Lateral scoot Chair/bed transfer assist level: Moderate assist (Pt 50 - 74%/lift or lower) Chair/bed transfer assistive device: Sliding board, Prosthesis      Locomotion Ambulation Ambulation activity did not occur: Safety/medical concerns         Wheelchair   Type: Manual Max wheelchair distance: 150 Assist Level: No help, No cues, assistive device, takes more than reasonable amount of time  Cognition Comprehension Comprehension assist level: Follows basic conversation/direction with no assist  Expression Expression assist level: Expresses basic needs/ideas: With no assist  Social Interaction Social Interaction assist level: Interacts appropriately 90% of the time - Needs monitoring or encouragement for participation or interaction.  Problem Solving Problem solving assist level: Solves basic 90% of the time/requires cueing < 10% of the time  Memory Memory assist level: Recognizes or recalls 75 - 89% of the time/requires cueing 10 - 24% of the time    Medical Problem List and Plan: 1. Decreased mobility and self care secondary to Left AKA with prior hx of R BKA  Cont CIR 2. DVT Prophylaxis/Anticoagulation: Pharmaceutical: Lovenox 3. Pain Management: Oxycodone IR 4. Mood: monitor for reactive depression 5. Neuropsych: This patient is capable of making decisions on his own behalf. 6. Skin/Wound Care: Dressing changes and ACE wrap to LLE 7. Fluids/Electrolytes/Nutrition: Monitor I/Os, meal intake, does not  need dietary consult  Eating 80-100% of meals 8. HTN: norvasc, lisinopril, coreg, with prn hydralazine  144/58, cont current meds 9. ESRD: HD managed by Nephro M-W F 10 Anemia of chronic disease: Aranesp weekly in HD  Hb 8.5 on 2/1 11. NISCM/CAD:Lipitor , ASA, Coreg 12.  Constipation-improved lg BM 2/3  Increased bowel meds on 2/1  Narcotics also decreased  Stool softener increased on 2/3  LOS (Days) 6 A FACE TO FACE EVALUATION WAS PERFORMED  Charlett Blake 10/29/2015 7:49 AM

## 2015-10-29 NOTE — Progress Notes (Signed)
Occupational Therapy Session Note  Patient Details  Name: Randy [name] MRN: [id number] Date of Birth: November 02, 1931  Today's Date: 10/29/2015 OT Individual Time:1430-1530   (60 min)  OT Individual Time Calculation (min): 60 min    Short Term Goals: Week 1:    Week 2:  OT Short Term Goal 1 (Week 2): Continue week 1 goals secondary to interrupted stay Week 3:     Skilled Therapeutic Interventions/Progress Updates:    Pt transfers from bed to wc with SB and min assist.  Propels wc to gym.  Engaged in theraband exercises with cues for technique and postioning.  Pt goes to kitchen and prepared PNB crackers at wc level with supervision for obtaining items.  Pt propels wc back to room and left with all needs in reach.    Therapy Documentation Precautions:  Precautions Precautions: Fall Precaution Comments: recent Lt AKA, h/o Rt BKA Restrictions Weight Bearing Restrictions: Yes LLE Weight Bearing: Non weight bearing       Pain: Pain Assessment Pain Assessment: 0-10 Pain Score: 5  Pain Location: Leg Pain Orientation: Left Pain Descriptors / Indicators: Aching Pain Onset: On-going Patients Stated Pain Goal: 4 Pain Intervention(s): Medication (See eMAR)           See Function Navigator for Current Functional Status.   Therapy/Group: Individual Therapy  Lisa Roca 10/29/2015, 6:13 PM

## 2015-10-29 NOTE — Progress Notes (Signed)
Occupational Therapy Session Note  Patient Details  Name: Randy [name] MRN: [id number] Date of Birth: September 22, 1932  Today's Date: 10/29/2015 OT Individual Time: 0700-0800 and 0905-1000 OT Individual Time Calculation (min): 60 min and 55 min   Short Term Goals: Week 1:    STG=LTG due to LOS  Skilled Therapeutic Interventions/Progress Updates:    Session One: Pt seen for OT ADL bathing and dressing session. Pt in supine upon arrival, declining pain and agreeable to bathing task seated EOB. He transferred to EOB with VCs for technique with VCs for technique and use of bed rails. He bathed seated EOB. Lateral leans completed on bed for clothing management and bathing periarea/ buttock demonstrating good functional sitting balance. He requested assist to get pants up all the way, educated regarding pushing up through bed with B UEs for caregiver to assist with pulling pants up, pt demonstrating fair UB strength, minimally able to clear butt of bed.  Pt ate breakfast seated EOB. Discussed d/c planning and wife's ability to provide needed assist. Pt reports that she will be in tomorrow for caregiver training.   He sat EOB to complete theraband exercises. Exercises completed at all major UE muscle groups x1 set of 10 with verbal and tactile cues provided for proper form and technique. Rest breaks required throughout. Pt declined doing second set of exercises, stating he was too fatigued.  Pt requested return to supine at end of session to rest until next tx session, left in supine with all needs in reach and bed alarm on.   Pt declined any concerns about upcoming d/c. Pt with questions regarding prosthetic, answered questions as able and  referred him to [employer] rep who will be here to see him tomorrow, per chart.   Session Two: Pt seen for OT session focusing on functional transfers, education, and UE strengthening. Pt in supine upon arrival with son present and agreeable to tx session. He transferred  EOB> w/c via siding board, able to direct caregiver for set-up.  Problem solving pt shower transfer at d/c. Pt with decreased insight into new deficits, saying multiple times " I did that before with no problem, so I know I can do it." Educated pt regarding changes in transfer method now that he is B amputee. Practicing sliding board transfer to shower chair, pt stating he believes w/ccan be pull right up next to seat. Completed transfer with min A and A to stabilize equipment. Advised pt that this was not a safe transfer method nor safe showering option. Advised pt and caregiver to wait until [facility] can assess home environment and assist with shower transfers, both voiced understanding and agreement that shower chair is not safe at this time.  Pt completed transfer on/off low soft surface couch, completed with assist to stabilize equipment.  He complete A/P transfer onto therapy mat. Completed x10 push ups on yoga blocks, educated regarding doing quality exercises vs. Quantity. He then completed x10 overhead presses using #2 dowel, rest breaks required throughout.  He self propelled w/c back to room at end of session, left sitting in chair with all needs in reach and son present.  Pt's caregiver voiced concerns regarding car transfers and use of public transportation at d/c. Will follow up with PT regarding car transfers and CSW currently working with family regarding public transportation.   Therapy Documentation Precautions:  Precautions Precautions: Fall Precaution Comments: recent Lt AKA, h/o Rt BKA Restrictions Weight Bearing Restrictions: Yes LLE Weight Bearing: Non weight bearing Pain: Pain  Assessment Pain Assessment: 0-10 Pain Score: 0-No pain   See Function Navigator for Current Functional Status.   Therapy/Group: Individual Therapy  Lewis, Kelsay Haggard C 10/29/2015, 6:46 AM

## 2015-10-30 ENCOUNTER — Inpatient Hospital Stay (HOSPITAL_COMMUNITY): Payer: Medicare Other | Admitting: Physical Therapy

## 2015-10-30 ENCOUNTER — Inpatient Hospital Stay (HOSPITAL_COMMUNITY): Payer: Medicare Other

## 2015-10-30 LAB — RENAL FUNCTION PANEL
ANION GAP: 15 (ref 5–15)
Albumin: 2.4 g/dL — ABNORMAL LOW (ref 3.5–5.0)
BUN: 74 mg/dL — ABNORMAL HIGH (ref 6–20)
CHLORIDE: 89 mmol/L — AB (ref 101–111)
CO2: 23 mmol/L (ref 22–32)
Calcium: 8.8 mg/dL — ABNORMAL LOW (ref 8.9–10.3)
Creatinine, Ser: 10.38 mg/dL — ABNORMAL HIGH (ref 0.61–1.24)
GFR calc non Af Amer: 4 mL/min — ABNORMAL LOW (ref >60)
GFR, EST AFRICAN AMERICAN: 5 mL/min — AB (ref >60)
GLUCOSE: 148 mg/dL — AB (ref 65–99)
POTASSIUM: 4.2 mmol/L (ref 3.5–5.1)
Phosphorus: 5 mg/dL — ABNORMAL HIGH (ref 2.5–4.6)
Sodium: 127 mmol/L — ABNORMAL LOW (ref 135–145)

## 2015-10-30 LAB — CBC
HCT: 31.4 % — ABNORMAL LOW (ref 39.0–52.0)
HEMOGLOBIN: 9.2 g/dL — AB (ref 13.0–17.0)
MCH: 28.3 pg (ref 26.0–34.0)
MCHC: 29.3 g/dL — AB (ref 30.0–36.0)
MCV: 96.6 fL (ref 78.0–100.0)
Platelets: 328 10*3/uL (ref 150–400)
RBC: 3.25 MIL/uL — AB (ref 4.22–5.81)
RDW: 19.2 % — ABNORMAL HIGH (ref 11.5–15.5)
WBC: 7.1 10*3/uL (ref 4.0–10.5)

## 2015-10-30 MED ORDER — DOCUSATE SODIUM 100 MG PO CAPS
200.0000 mg | ORAL_CAPSULE | Freq: Three times a day (TID) | ORAL | Status: DC
  Administered 2015-10-30 – 2015-10-31 (×2): 200 mg via ORAL
  Filled 2015-10-30 (×2): qty 2

## 2015-10-30 MED ORDER — OXYCODONE HCL 5 MG PO TABS
5.0000 mg | ORAL_TABLET | Freq: Four times a day (QID) | ORAL | Status: DC | PRN
  Administered 2015-10-30 – 2015-10-31 (×3): 5 mg via ORAL
  Filled 2015-10-30 (×3): qty 1

## 2015-10-30 NOTE — Progress Notes (Signed)
  [facility] Progress Note   Subjective: stable  Filed Vitals:   10/29/15 0508 10/29/15 1411 10/29/15 2100 10/30/15 0601  BP: 144/58 113/60 137/59 146/63  Pulse: 73 78 72 63  Temp: 98.6 F (37 C) 97.6 F (36.4 C)  98.2 F (36.8 C)  TempSrc: Oral Oral  Oral  Resp: 17 18 18 17   Weight: 58.2 kg (128 lb 4.9 oz)   60.1 kg (132 lb 7.9 oz)  SpO2: 96% 99% 99% 100%    Inpatient medications: . amLODipine  5 mg Oral QHS  . aspirin  81 mg Oral Daily  . atorvastatin  40 mg Oral q1800  . bisacodyl  10 mg Rectal Once  . brinzolamide  1 drop Both Eyes BID   And  . brimonidine  1 drop Both Eyes BID  . calcitRIOL  0.5 mcg Oral Daily  . carvedilol  25 mg Oral BID WC  . darbepoetin (ARANESP) injection - DIALYSIS  200 mcg Intravenous Q Wed-HD  . docusate sodium  200 mg Oral TID  . enoxaparin (LOVENOX) injection  30 mg Subcutaneous Q24H  . feeding supplement  1 Container Oral TID BM  . ferric gluconate (FERRLECIT/NULECIT) IV  125 mg Intravenous Q M,W,F-HD  . gabapentin  100 mg Oral QHS  . isosorbide mononitrate  30 mg Oral Daily  . latanoprost  1 drop Both Eyes QHS  . lisinopril  10 mg Oral Daily  . multivitamin  1 tablet Oral QHS  . naphazoline-pheniramine  1-2 drop Both Eyes BID  . pantoprazole  40 mg Oral Q1200  . senna  2 tablet Oral QHS  . sevelamer carbonate  1,600 mg Oral With snacks  . 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 - on rehab 2 ESRD MWF hd 3 HTN on coreg/  amlod/ lisinop 4 Vol 2-4kg under dry wt 5 MBD cont renvela/ vdra 6 Anemia IV fe, max esa 7 CP episode - seen by cardiol > poor cath candidate, demand ischemia from [facility], medical Rx  Plan - HD today   Kelly Splinter MD [facility] pager [phone]    cell [phone] 10/30/2015, 8:57 AM    Recent Labs Lab 10/23/15 1200 10/25/15 1628 10/27/15 1438  NA 135 133* 135  K 4.8 4.1 3.7  CL 92* 92* 94*  CO2 27 25 24   GLUCOSE 114* 170* 116*  BUN 78* 55* 56*  CREATININE 9.78* 8.18* 8.62*  CALCIUM 9.1 8.7* 8.8*  PHOS 5.8* 5.2* 6.2*    Recent Labs Lab 10/23/15 1200 10/25/15 1628 10/27/15 1438  ALBUMIN 2.0* 2.1* 2.3*    Recent Labs Lab 10/23/15 1200 10/25/15 1628 10/27/15 1439  WBC 5.6 8.5 6.3  HGB 8.8* 8.5* 9.2*  HCT 28.1* 27.8* 30.6*  MCV 96.2 96.9 97.1  PLT 291 298 294

## 2015-10-30 NOTE — Plan of Care (Signed)
Problem: RH PAIN MANAGEMENT Goal: RH STG PAIN MANAGED AT OR BELOW PT'S PAIN GOAL < 4  Outcome: Progressing No complaints of pain during this shift. Pain medication was administered at the end of previous shift.

## 2015-10-30 NOTE — Progress Notes (Signed)
Randy [name] PHYSICAL MEDICINE & REHABILITATION     PROGRESS NOTE  Subjective/Complaints:  Pt sitting up this AM eating breakfast.  He had a good weekend, but notes his BM was hard.  He would like to increase his stool softener.    ROS: Denies LE pain, CP, SOB, n/v/d.  Objective: Vital Signs: Blood pressure 146/63, pulse 63, temperature 98.2 F (36.8 C), temperature source Oral, resp. rate 17, weight 60.1 kg (132 lb 7.9 oz), SpO2 100 %. No results found.  Recent Labs  10/27/15 1439  WBC 6.3  HGB 9.2*  HCT 30.6*  PLT 294    Recent Labs  10/27/15 1438  NA 135  K 3.7  CL 94*  GLUCOSE 116*  BUN 56*  CREATININE 8.62*  CALCIUM 8.8*   CBG (last 3)  No results for input(s): GLUCAP in the last 72 hours.  Wt Readings from Last 3 Encounters:  10/30/15 60.1 kg (132 lb 7.9 oz)  10/23/15 61 kg (134 lb 7.7 oz)  10/21/15 59 kg (130 lb 1.1 oz)    Physical Exam:  BP 146/63 mmHg  Pulse 63  Temp(Src) 98.2 F (36.8 C) (Oral)  Resp 17  Wt 60.1 kg (132 lb 7.9 oz)  SpO2 100% Constitutional: He appears well-developed and well-nourished. NAD HENT: Head: Normocephalic and atraumatic.  Mouth/Throat: Oropharynx is clear and moist.  Eyes: Conjunctivae and EOM are normal.  Cardiovascular: Normal rate and regular rhythm. Murmur heard. Respiratory: Effort normal and breath sounds normal. No stridor. No respiratory distress. He has no wheezes. He has no rales.  GI: Soft. Bowel sounds are normal. He exhibits no distension. There is no tenderness.  Musculoskeletal:  Right BKA well healed no edema, non tender Left AKA with staples c/d/i Neurological: He is alert and oriented.  4+/5 in BUE and 4+/5 B HF Skin: Skin is warm.  Psychiatric: He has a normal mood and affect.   Assessment/Plan: 1. Functional deficits secondary to left AKA 10/10/15 with prior hx of R BKA which require 3+ hours per day of interdisciplinary therapy in a comprehensive inpatient rehab setting. Physiatrist is  providing close team supervision and 24 hour management of active medical problems listed below. Physiatrist and rehab team continue to assess barriers to discharge/monitor patient progress toward functional and medical goals.  Function:  Bathing Bathing position   Position: Sitting EOB  Bathing parts Body parts bathed by patient: Right arm, Left arm, Chest, Abdomen, Front perineal area, Buttocks, Right upper leg, Left upper leg Body parts bathed by helper: Back  Bathing assist Assist Level: Set up   Set up : To obtain items  Upper Body Dressing/Undressing Upper body dressing   What is the patient wearing?: Pull over shirt/dress     Pull over shirt/dress - Perfomed by patient: Thread/unthread right sleeve, Thread/unthread left sleeve, Put head through opening, Pull shirt over trunk          Upper body assist Assist Level: Set up   Set up : To obtain clothing/put away  Lower Body Dressing/Undressing Lower body dressing   What is the patient wearing?: Underwear, Pants Underwear - Performed by patient: Thread/unthread right underwear leg, Thread/unthread left underwear leg, Pull underwear up/down Underwear - Performed by helper: Thread/unthread right underwear leg, Thread/unthread left underwear leg, Pull underwear up/down Pants- Performed by patient: Thread/unthread right pants leg, Thread/unthread left pants leg, Pull pants up/down Pants- Performed by helper: Pull pants up/down  Lower body assist Assist for lower body dressing: Touching or steadying assistance (Pt > 75%)      Toileting Toileting   Toileting steps completed by patient: Adjust clothing prior to toileting, Performs perineal hygiene Toileting steps completed by helper: Adjust clothing after toileting    Toileting assist Assist level: Touching or steadying assistance (Pt.75%)   Transfers Chair/bed transfer   Chair/bed transfer method: Lateral scoot Chair/bed transfer assist level:  Touching or steadying assistance (Pt > 75%) Chair/bed transfer assistive device: Sliding board     Locomotion Ambulation Ambulation activity did not occur: Safety/medical concerns         Wheelchair   Type: Manual Max wheelchair distance: 150 Assist Level: No help, No cues, assistive device, takes more than reasonable amount of time  Cognition Comprehension Comprehension assist level: Follows basic conversation/direction with no assist  Expression Expression assist level: Expresses basic needs/ideas: With no assist  Social Interaction Social Interaction assist level: Interacts appropriately 90% of the time - Needs monitoring or encouragement for participation or interaction.  Problem Solving Problem solving assist level: Solves complex 90% of the time/cues < 10% of the time  Memory Memory assist level: Recognizes or recalls 75 - 89% of the time/requires cueing 10 - 24% of the time    Medical Problem List and Plan: 1. Decreased mobility and self care secondary to Left AKA with prior hx of R BKA  Cont CIR 2. DVT Prophylaxis/Anticoagulation: Pharmaceutical: Lovenox 3. Pain Management: Oxycodone IR, cont to wean 4. Mood: monitor for reactive depression 5. Neuropsych: This patient is capable of making decisions on his own behalf. 6. Skin/Wound Care: Dressing changes and ACE wrap to LLE 7. Fluids/Electrolytes/Nutrition: Monitor I/Os, meal intake, does not  need dietary consult  Eating 80-100% of meals 8. HTN: norvasc, lisinopril, coreg, with prn hydralazine  146/63, cont current meds 9. ESRD: HD managed by Nephro M-W F 10 Anemia of chronic disease: Aranesp weekly in HD  Hb 9.2 on 2/3 11. NISCM/CAD:Lipitor , ASA, Coreg 12.  Constipation-improved last BM 2/6  Increased bowel meds on 2/1  Narcotics also decreased  Stool softener increased on 2/3  LOS (Days) 7 A FACE TO FACE EVALUATION WAS PERFORMED  Randy [name] Lorie Phenix 10/30/2015 8:11 AM

## 2015-10-30 NOTE — Progress Notes (Signed)
Occupational Therapy Session Note  Patient Details  Name: Randy [name] MRN: [id number] Date of Birth: 07-22-1932  Today's Date: 10/30/2015 OT Individual Time: 1000-1100 OT Individual Time Calculation (min): 60 min    Short Term Goals: Week 2:  OT Short Term Goal 1 (Week 2): Continue week 1 goals secondary to interrupted stay  Skilled Therapeutic Interventions/Progress Updates: ADL-retraining with focus on family education on assisted BADL and shower transfers.   Pt received with family members and caregiver [name]) present.   OT educated pt/family on concerns reviewed during pt's treatment over the weekend to include shower transfers with son (not present during this session) assisting.   Per family, pt has access to a bathtub (whirlpool/jacuzzi) in large bathroom however there is no shower attachment available.   OT demo'd tub bench transfer to standard tub with pt's wife however per family renovations would be required to adapt a shower and pt's current preference is to attempt use of his walk-in shower which has a hand shower, grab bar, and shower chair only 2 or 3 times weekly with assistance, accepting bed level bathing bathing as discussed with therapist.    Pt then self-propelled w/c to bathroom with OT, Randy [name] and daughter present to problem-solve difficulties with slide board transfer.   After several attempts, a preferred method was developed which required overall steadying assist X 1 during transfer with w/c facing shower chair, and +2 assist for safety when at home.   Pt and family were re-educated on need for dry surfaces when exiting shower, to include use of towel under pt when returning to w/c.           Therapy Documentation Precautions:  Precautions Precautions: Fall Precaution Comments: recent Lt AKA, h/o Rt BKA Restrictions Weight Bearing Restrictions: Yes LLE Weight Bearing: Non weight bearing   Pain: Pain Assessment Pain Assessment: No/denies pain  See Function  Navigator for Current Functional Status.   Therapy/Group: Individual Therapy   Second session: Time: 1300-1330 Time Calculation (min):  30 min  Pain Assessment:  Pain Assessemtn Pain Assessment: No/denies pain  Skilled Therapeutic Interventions: ADL-retraining with focus on family education discharge planning, and performance of slide board transfers to/from bed, with daughter, spouse, and cousins present.   Pt received seated in his w/c awaiting therapist and reporting fatigue from previous therapies.   Pt presented with challenge of returning to bed with bed height set at 28" to duplicate his home environment.   Pt able to complete transfer with overall mod assist and extra time.  After prolonged discussion with family, OT recommend removal and replacement of pt's box spring to reduce height of bed and improve transfer success as pt's wife is unable to provide adequate assist and pt fatigues greatly in the afternoon.   Family agreed to rehearse all methods used (slide board with prosthesis and bed rail preferred) while pt is aware and able to sustain physical demands of transfer prior to transfers at night.    Per family, sufficient assistance would be recruited without reliance on pt's spouse each night.  See FIM for current functional status  Therapy/Group: Individual Therapy  [place] 10/30/2015, 12:29 PM

## 2015-10-30 NOTE — Progress Notes (Signed)
Physical Therapy Discharge Summary  Patient Details  Name: Randy [name] MRN: [id number] Date of Birth: 01/31/1932  Today's Date: 10/30/2015 PT Individual Time:  11:00-12:11 and 2353-6144 PT Individual Time Calculation (min): 71 min and 8 min  (missed 22 min of pm session due to fatigue and awaiting transport to HD)    Patient has met 6 of 8 long term goals due to improved activity tolerance, improved balance, improved postural control, increased strength, increased range of motion, decreased pain and ability to compensate for deficits.  Patient to discharge at a wheelchair level Supervision for w/c mobility but min-mod A for transfers (min A for A/P transfers without prosthesis or on level surfaces, mod A for lateral transfers with slideboard and prosthesis on uneven surfaces).   Patient's care partner is independent to provide the necessary physical, cognitive and supervision assistance at discharge.  Reasons goals not met: Pt did not meet supervision for dynamic sitting balance goal-with prosthesis doffed pt can require mod A for dynamic sitting balance due to posterior lean/bias, with prosthesis donned pt requires min A for sitting balance.  Pt did not reach min A for w/c <> elevated bed transfer; pt continues to require mod A to perform uphill transfers safely; able to perform level transfers with min A.    Recommendation:  Patient will benefit from ongoing skilled PT services in home health setting to continue to advance safe functional mobility, address ongoing impairments in impaired endurance, impaired UE, LE and core strength and ROM, postural control, balance, pain management, and minimize fall risk.  Equipment: Long slideboard  Reasons for discharge: treatment goals met and discharge from hospital  Patient/family agrees with progress made and goals achieved: Yes  PT Discharge Pt family present for education and hands on training.  Family reports they have practiced simulated car  transfer with OT and feel comfortable performing and do not feel the need to practice the actual car.  Family states social worker is setting pt up with public transportation so that pt can ride in his w/c to/from HD.  Prosthetist present to assess R prosthesis; prosthetist reports adding padding to socket and recommends pt to wear prosthesis for more prolonged period of time and then report to him pain and pressure  Points.  Discussed with family safety recommendations for home, f/u therapy recommendations and equipment ordered for D/C.  Focus of hands on education on transfer training for w/c <> 28" bed with slideboard and prosthesis donned for balance and assistance with pushing uphill.  Daughter reports they will be buying a rail for pt to use on his bed at home.  Demonstrated how to perform safe set up and transfer w/c <> bed with PT providing min-mod A during uphill transfer and min A for downhill into w/c.  Caregiver and patient return demonstrated transfer with slideboard with caregiver providing min-mod A and therapist verbalizing safe sequence 75% of transfer.  Pt and OT reporting that surgeon came by this am and stated that he could leave his residual limb open to air and does not need compression wrapping anymore; clarified with RN and PA.  PA stating pt does have the option to leave compression garments off during the day; PT recommending to pt and family that if his residual limb becomes edematous or painful to wrap with ace wraps or shrinker once staples removed.  Provided and reviewed with pt and family handouts with pictures of proper wrapping techniques, proper positioning, differences in pain and LE HEP.  Will demonstrate  wrapping and HEP this pm with family. Pt left in w/c at end of session with family present.  PM session:  Pt received in bed with family gone already for the evening.  Pt refusing to participate in further therapy even exercises offered at bed level.  Reviewed recommendations  again with pt and performed reassessment of LE strength and sensation.  Pt left in bed with all items within reach.  Received report from OT regarding pt functional level for bed mobility and car transfers.    Pain Pain Assessment Pain Assessment: No/denies pain Sensation Sensation Light Touch: Appears Intact Stereognosis: Not tested Hot/Cold: Not tested Proprioception: Not tested Coordination Gross Motor Movements are Fluid and Coordinated: Not tested Fine Motor Movements are Fluid and Coordinated: Not tested Motor  Motor Motor: Abnormal postural alignment and control Motor - Discharge Observations: weakness in bilat UE and LE and strong posterior bias due to bilat amputation  Mobility Bed Mobility Bed Mobility: Rolling Right;Rolling Left;Supine to Sit;Sit to Supine Rolling Right: 6: Modified independent (Device/Increase time) (with rail) Rolling Left: 6: Modified independent (Device/Increase time) (with rail) Supine to Sit: 5: Supervision;4: Min assist;With rails Sit to Supine: 5: Supervision;4: Min guard;With rail Transfers Transfers: Yes Anterior-Posterior Transfer: 4: Hotel manager Details (indicate cue type and reason): Min A to maintain forward balance on level transfers without use of prosthesis Lateral/Scoot Transfers: 4: Min assist Lateral/Scoot Transfer Details (indicate cue type and reason): Lateral transfers with use of board and prosthesis for w/c <> elevated bed (28") with mod A for uphill transfer and min A downhill and min A for level lateral transfer w/c <> car  Locomotion  Ambulation Ambulation: No Gait Gait: No Stairs / Additional Locomotion Stairs: No Wheelchair Mobility Wheelchair Mobility: Yes Wheelchair Assistance: 6: Modified independent (Device/Increase time) Environmental health practitioner: Both upper extremities Wheelchair Parts Management: Supervision/cueing Distance: 150'  Trunk/Postural Assessment  Cervical  Assessment Cervical Assessment: Within Functional Limits Thoracic Assessment Thoracic Assessment: Exceptions to [facility] (kyphotic without back support; flexible) Lumbar Assessment Lumbar Assessment: Exceptions to [facility] (posteriorly tilted; flexible) Postural Control Righting Reactions: Strong posterior bias due to bilat amputations; requires increased use of UE to maintain COG over BOS  Balance Balance Balance Assessed: Yes Static Sitting Balance Static Sitting - Balance Support: Bilateral upper extremity supported Static Sitting - Level of Assistance: 5: Stand by assistance Dynamic Sitting Balance Dynamic Sitting - Balance Support: Bilateral upper extremity supported Dynamic Sitting - Level of Assistance: 4: Min assist Extremity Assessment      RLE Assessment RLE Assessment: Within Functional Limits LLE Assessment LLE Assessment: Exceptions to WFL LLE Strength LLE Overall Strength Comments: 4/5   See Function Navigator for Current Functional Status.  [name] [place] 10/30/2015, 2:03 PM

## 2015-10-31 MED ORDER — SENNOSIDES-DOCUSATE SODIUM 8.6-50 MG PO TABS: 2.0000 | ORAL_TABLET | Freq: Every day | ORAL | Status: DC

## 2015-10-31 MED ORDER — AMLODIPINE BESYLATE 10 MG PO TABS: 5.0000 mg | ORAL_TABLET | Freq: Every day | ORAL | Status: DC

## 2015-10-31 MED ORDER — POLYVINYL ALCOHOL 1.4 % OP SOLN: 1.0000 [drp] | OPHTHALMIC | Status: DC | PRN

## 2015-10-31 MED ORDER — DOCUSATE SODIUM 100 MG PO CAPS: 200.0000 mg | ORAL_CAPSULE | Freq: Every day | ORAL | Status: AC

## 2015-10-31 MED ORDER — OXYCODONE HCL 5 MG PO TABS: 5.0000 mg | ORAL_TABLET | Freq: Four times a day (QID) | ORAL | Status: DC | PRN

## 2015-10-31 MED ORDER — CARVEDILOL 25 MG PO TABS: 25.0000 mg | ORAL_TABLET | Freq: Two times a day (BID) | ORAL | Status: DC

## 2015-10-31 NOTE — Progress Notes (Signed)
Occupational Therapy Discharge Summary  Patient Details  Name: LUCCIANO VITALI MRN: [id number] Date of Birth: [birth year]   Patient has met 8 of 8 long term goals due to improved activity tolerance, improved balance and ability to compensate for deficits.  Patient to discharge at [facility] Assist level.  Patient's care partner is independent to provide the necessary physical assistance at discharge.    Reasons goals not met: n/a  Recommendation:  Patient will benefit from ongoing skilled OT services in home health setting to continue to advance functional skills in the area of BADL.  Equipment: BSC and slide board  Reasons for discharge: treatment goals met  Patient/family agrees with progress made and goals achieved: Yes  OT Discharge Precautions/Restrictions  Precautions Precautions: Fall Restrictions Weight Bearing Restrictions: Yes LLE Weight Bearing: Non weight bearing  Pain Pain Assessment Pain Assessment: No/denies pain Pain Score: 2  Pain Type: Surgical pain Pain Location: Leg Pain Orientation: Left Pain Descriptors / Indicators: Aching Pain Onset: On-going Patients Stated Pain Goal: 2 Pain Intervention(s): Medication (See eMAR);Repositioned  ADL ADL ADL Comments: see Functional Assessment Tool   Vision/Perception  Vision- History Baseline Vision/History: Wears glasses Wears Glasses: Reading only Patient Visual Report: No change from baseline Vision- Assessment Vision Assessment?: No apparent visual deficits Perception Comments: WFL   Cognition Overall Cognitive Status: Within Functional Limits for tasks assessed Arousal/Alertness: Awake/alert Orientation Level: Oriented X4 Attention: Alternating Alternating Attention: Appears intact Memory: Appears intact Awareness: Appears intact Problem Solving: Appears intact Safety/Judgment: Appears intact  Sensation Sensation Light Touch: Impaired Detail Light Touch Impaired Details: Impaired  LLE Stereognosis: Appears Intact Hot/Cold: Appears Intact Proprioception: Appears Intact Additional Comments: WFL for BUE during BADL Coordination Gross Motor Movements are Fluid and Coordinated: Yes Fine Motor Movements are Fluid and Coordinated: Yes  Motor  Motor Motor: Within Functional Limits Motor - Discharge Observations: weakness in bilat UE and LE and strong posterior bias due to bilat amputation  Mobility  Bed Mobility Bed Mobility: Rolling Right;Rolling Left;Supine to Sit;Sit to Supine Rolling Right: 6: Modified independent (Device/Increase time) Rolling Left: 6: Modified independent (Device/Increase time) Supine to Sit: 5: Supervision;4: Min assist;With rails Sit to Supine: 5: Supervision;4: Min guard;With rail   Trunk/Postural Assessment  Cervical Assessment Cervical Assessment: Within Functional Limits Thoracic Assessment Thoracic Assessment: Exceptions to [facility] Lumbar Assessment Lumbar Assessment: Exceptions to [facility] Postural Control Righting Reactions: Strong posterior bias due to bilat amputations; requires increased use of UE to maintain COG over BOS   Balance Balance Balance Assessed: Yes Static Sitting Balance Static Sitting - Balance Support: Bilateral upper extremity supported Static Sitting - Level of Assistance: 5: Stand by assistance Dynamic Sitting Balance Dynamic Sitting - Balance Support: Bilateral upper extremity supported Dynamic Sitting - Level of Assistance: 4: Min assist  Extremity/Trunk Assessment RUE Assessment RUE Assessment: Within Functional Limits LUE Assessment LUE Assessment: Within Functional Limits   See Function Navigator for Current Functional Status.  [facility] 11/01/2015, 11:58 AM

## 2015-10-31 NOTE — Progress Notes (Signed)
Social Work Discharge Note  The overall goal for the admission was met for:   Discharge location: Yes - home  Length of Stay: No - 19 days - pt stayed a little longer than anticipated due to interrupted stay with acute transfer and needing extra time to reach goals  Discharge activity level: Yes - many goals were downgraded to supervision when pt returned to rehab from acute  Home/community participation: Yes  Services provided included: MD, RD, PT, OT, RN, Pharmacy, Neuropsych and SW  Financial Services: Private Insurance: [employer]  Follow-up services arranged: Home Health: PT/OT/RN/Bath aide, DME: 30" slide board; drop arm commode and Patient/Family request agency HH: [facility] , DME: [facility]  Comments (or additional information):  Pt to return to home with his wife and other extended family coming to help.  Pt feels ready to go home and family has received education on pt's care.  Pt will return to dialysis center on m,w,f and CSW put his name on waiting list for [facility].  CSW also found out about [employer] which is about $35 round trip.  CSW told pt's dtr/wife about both of these resources.  Dialysis center is also going to help pt with this.  They are aware of his return tomorrow, as well.  HH and DME arranged, as well as PCP post-hospital f/u appt.  Patient/Family verbalized understanding of follow-up arrangements: Yes  Individual responsible for coordination of the follow-up plan: pt and his family (wife, dtr, niece, etc)  Confirmed correct DME delivered: Garvin Fila Silvestre Mesi 10/31/2015    Mina Carlisi, Silvestre Mesi

## 2015-10-31 NOTE — Discharge Instructions (Signed)
Inpatient Rehab Discharge Instructions  Randy [name] Discharge date and time:  10/31/15  Activities/Precautions/ Functional Status: Activity: activity as tolerated Diet: renal diet Wound Care:  Functional status:  ___ No restrictions     ___ Walk up steps independently _X__ 24/7 supervision/assistance   ___ Walk up steps with assistance ___ Intermittent supervision/assistance  ___ Bathe/dress independently ___ Walk with walker     __X_ Bathe/dress with assistance ___ Walk Independently    ___ Shower independently ___ Walk with assistance    ___ Shower with assistance _X__ No alcohol     ___ Return to work/school ________   COMMUNITY REFERRALS UPON DISCHARGE:   Home Health:   PT     OT     RN     SNA  Agency:  [facility] Phone:  [phone] Medical Equipment/Items Ordered:  30" slide board  Agency/Supplier:  [facility]        Phone:  [phone] Other:  Referral to [facility] through [place] and [facility] - he is on the waiting list - [name] @ [facility] [phone] and [name] and SW from dialysis center are working on this  [place] PATIENT/FAMILY: Support Groups:  [facility] [place]                              Meets the second Tuesday of the month 7-8:30 pm                              [facility]   [phone]  Special Instructions: 1. Wear brace on right hand  with activity--use for pain control. 2.  Do not use hydralazine. Note decrease in amlodipine dose.    My questions have been answered and I understand these instructions. I will adhere to these goals and the provided educational materials after my discharge from the hospital.  Patient/Caregiver Signature _______________________________ Date __________  Clinician Signature _______________________________________ Date __________  Please bring this form and your medication list with you to all your follow-up doctor's appointments.

## 2015-10-31 NOTE — Discharge Summary (Signed)
Physician Discharge Summary  Patient ID: Randy [name] MRN: [id number] DOB/AGE: [birth year] 82 y.o.  Admit date: 10/23/2015 Discharge date: 10/31/2015  Discharge Diagnoses:  Principal Problem:   Unilateral AKA ([place]) Active Problems:   HLD (hyperlipidemia)   Hypertensive heart disease   Secondary cardiomyopathy ([place])   Anemia of chronic disease   Acute on chronic combined systolic and diastolic CHF (congestive heart failure) (HCC)   ESRD (end stage renal disease) on dialysis (HCC)   Blood loss anemia   CAD (coronary artery disease)   Abnormality of gait   Essential hypertension   Post-operative pain   Unilateral complete BKA (HCC)   Constipation due to pain medication   Adjustment disorder with depressed mood   Discharged Condition: Stable.   Labs:  Basic Metabolic Panel:  Recent Labs Lab 11/04/15 1114  NA 140  K 4.3  CL 97*  CO2 30  GLUCOSE 155*  BUN 47*  CREATININE 5.98*  CALCIUM 9.6    CBC:  Recent Labs Lab 11/04/15 1114  WBC 7.7  NEUTROABS 6.3  HGB 11.2*  HCT 39.1  MCV 101.8*  PLT 291    CBG: No results for input(s): GLUCAP in the last 168 hours.  Brief HPI:   Randy [name] is a 82 y.o. 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 Patient elected to undergo L-AKA on 10/10/15 by Dr. Donnetta Hutching. Post op has had bleeding from stump requiring dressing changes. He was admitted to CIR on 10/12/15 for intensive rehab program.  He was making steady progress with therapy but his rehab stay was interrupted due to elevated BP with SOB and chest pain. He was transferred to acute for closer monitoring.  Cardiac enzymes showed mild elevation which was difficulty to assess in setting of ESRD. Cardiology felt that this was due to demand ischemia due to uncontrolled HTN and he is to continue medical management with ASA, BB and statin.  He  was transferred back to rehab on 10/23/15 to complete his rehab stay.     Hospital Course: Randy [name] was admitted to rehab 10/23/2015 for inpatient therapies to consist of PT and OT at least three hours five days a week. Past admission physiatrist, therapy team and rehab RN have worked together to provide customized collaborative inpatient rehab.  He continued with HD on MWF after therapy to help with tolerance of activity during the day.  BP were monitored on BID basis and were relatively controlled on coreg, amlodipine and lisinopril. He has had intermittent elevations but tight control avoided to prevent hypotensive episodes with HD. Acute on chronic Anemia has been monitored with serial check and he continues on aranesp with HD.  His po intake has been good and he is continent of bowel and bladder. Pain control has improved and he was slowly being weaned from oxycodone. He has had complaints of constipation due to narcotic use and bowel program was adjusted with good results.  Patient has progressed with therapy and is at supervision to min assist at discharge. He will continue to receive follow up [place], [place], [place] and [place] aide by [facility] care after discharge.    Rehab course: During patient's stay in rehab weekly team conferences were held to monitor patient's progress, set goals and discuss barriers to discharge. At admission patient requied max assist with mobility and moderate assistance with basic self care tasks.  He  has had improvement in activity tolerance, balance, postural control, as well as ability to compensate for deficits.  He requires supervision for wheelchair mobility and min to moderate assist for transfers. He is able to complete ADL tasks with min assist. Family education was done with daughter regarding all aspects of care and family will assist as needed after discharge.     Disposition: 01-Home or Self Care  Diet: Renal diet. 1200 cc FR/day.   Special  Instructions: 1. Wear brace on right hand  with activity--use for pain control. 2.  Do not use hydralazine. Note decrease in amlodipine dose.    Discharge Instructions    Ambulatory referral to Physical Medicine Rehab    Complete by:  As directed   4 week follow up L-BKA/ needs tue/thus            Medication List    STOP taking these medications        atorvastatin 40 MG tablet  Commonly known as:  LIPITOR     feeding supplement Liqd     hydrALAZINE 25 MG tablet  Commonly known as:  APRESOLINE     lisinopril 10 MG tablet  Commonly known as:  PRINIVIL,ZESTRIL      TAKE these medications        amLODipine 10 MG tablet  Commonly known as:  NORVASC  Take 0.5 tablets (5 mg total) by mouth daily.     aspirin 81 MG tablet  Take 81 mg by mouth daily.     calcitRIOL 0.5 MCG capsule  Commonly known as:  ROCALTROL  Take 1 capsule (0.5 mcg total) by mouth daily.     carvedilol 25 MG tablet  Commonly known as:  COREG  Take 1 tablet (25 mg total) by mouth 2 (two) times daily with a meal.     docusate sodium 100 MG capsule  Commonly known as:  COLACE  Take 2 capsules (200 mg total) by mouth daily.     gabapentin 100 MG capsule  Commonly known as:  NEURONTIN  Take 1 capsule (100 mg total) by mouth at bedtime.     isosorbide mononitrate 30 MG 24 hr tablet  Commonly known as:  IMDUR  Take 0.5 tablets (15 mg total) by mouth at bedtime.     lidocaine-prilocaine cream  Commonly known as:  EMLA  Apply 1 application topically as needed. For dialysis     LUMIGAN 0.01 % Soln  Generic drug:  bimatoprost  Place 1 drop into both eyes at bedtime.     multivitamin Tabs tablet  Take 1 tablet by mouth daily.     oxyCODONE 5 MG immediate release tablet--Rx# 50 pills   Commonly known as:  Oxy IR/ROXICODONE  Take 1 tablet (5 mg total) by mouth every 6 (six) hours as needed for moderate pain or severe pain.     pantoprazole 40 MG tablet  Commonly known as:  PROTONIX  Take 1  tablet (40 mg total) by mouth daily at 12 noon.     polyvinyl alcohol 1.4 % ophthalmic solution  Commonly known as:  LIQUIFILM TEARS  Place 1 drop into both eyes as needed for dry eyes.     senna-docusate 8.6-50 MG tablet  Commonly known as:  Senokot-S  Take 2 tablets by mouth at bedtime. Available over the counter--for constipation.     sevelamer carbonate 800 MG tablet  Commonly known as:  RENVELA  Take 1,600-2,400 mg by mouth See admin instructions. Take 3 tablets (2400 mg)  with meals and 2 tablets (1600 mg) with snacks     SIMBRINZA 1-0.2 % Susp  Generic drug:  Brinzolamide-Brimonidine  Place 1 drop into both eyes 2 (two) times daily.       Follow-up Information    Follow up with [name], MD.   Specialty:  Physical Medicine and Rehabilitation   Why:  office will call you with appointment   Contact information:   [place] [address] [phone]       Follow up with Curt Jews, MD On 11/08/2015.   Specialties:  Vascular Surgery, Cardiology   Why:  appointment at 2: 15 pm   Contact information:   [place] [id number] [phone]       Follow up with [name], MD On 11/14/2015.   Specialty:  Internal Medicine   Why:  @ 8:45 AM   Contact information:   [place] [id number] [phone]       Signed: Bary Leriche 11/07/2015, 5:14 PM

## 2015-10-31 NOTE — Progress Notes (Signed)
[facility] PHYSICAL MEDICINE & REHABILITATION     PROGRESS NOTE  Subjective/Complaints:  Randy [name] sitting comfortably in bed this AM.  He is eager to go home.  He notes improvement in BMs and denies pain.    ROS: Denies LE pain, CP, SOB, n/v/d.  Objective: Vital Signs: Blood pressure 164/69, pulse 73, temperature 98.2 F (36.8 C), temperature source Oral, resp. rate 18, weight 56.6 kg (124 lb 12.5 oz), SpO2 99 %. No results found.  Recent Labs  10/30/15 1543  WBC 7.1  HGB 9.2*  HCT 31.4*  PLT 328    Recent Labs  10/30/15 1544  NA 127*  K 4.2  CL 89*  GLUCOSE 148*  BUN 74*  CREATININE 10.38*  CALCIUM 8.8*   CBG (last 3)  No results for input(s): GLUCAP in the last 72 hours.  Wt Readings from Last 3 Encounters:  10/31/15 56.6 kg (124 lb 12.5 oz)  10/23/15 61 kg (134 lb 7.7 oz)  10/21/15 59 kg (130 lb 1.1 oz)    Physical Exam:  BP 164/69 mmHg  Pulse 73  Temp(Src) 98.2 F (36.8 C) (Oral)  Resp 18  Wt 56.6 kg (124 lb 12.5 oz)  SpO2 99% Constitutional: He appears well-developed and well-nourished. NAD HENT: Head: Normocephalic and atraumatic.  Mouth/Throat: Oropharynx is clear and moist.  Eyes: Conjunctivae and EOM are normal.  Cardiovascular: Normal rate and regular rhythm. Murmur heard. Respiratory: Effort normal and breath sounds normal. No stridor. No respiratory distress. He has no wheezes. He has no rales.  GI: Soft. Bowel sounds are normal. He exhibits no distension. There is no tenderness.  Musculoskeletal:  Right BKA well healed no edema, non tender Left AKA with staples c/d/i Neurological: He is alert and oriented.  4+/5 in BUE and 4+/5 B HF Skin: Skin is warm, incision c/d/i. Psychiatric: He has a normal mood and affect.   Assessment/Plan: 1. Functional deficits secondary to left AKA 10/10/15 with prior hx of R BKA which require 3+ hours per day of interdisciplinary therapy in a comprehensive inpatient rehab setting. Physiatrist is providing  close team supervision and 24 hour management of active medical problems listed below. Physiatrist and rehab team continue to assess barriers to discharge/monitor patient progress toward functional and medical goals.  Function:  Bathing Bathing position   Position: Sitting EOB  Bathing parts Body parts bathed by patient: Right arm, Left arm, Chest, Abdomen Body parts bathed by helper: Back  Bathing assist Assist Level: Set up   Set up : To obtain items  Upper Body Dressing/Undressing Upper body dressing   What is the patient wearing?: Pull over shirt/dress     Pull over shirt/dress - Perfomed by patient: Thread/unthread right sleeve, Thread/unthread left sleeve, Put head through opening, Pull shirt over trunk          Upper body assist Assist Level: Set up   Set up : To obtain clothing/put away  Lower Body Dressing/Undressing Lower body dressing   What is the patient wearing?: Underwear, Pants Underwear - Performed by patient: Thread/unthread right underwear leg, Thread/unthread left underwear leg, Pull underwear up/down Underwear - Performed by helper: Thread/unthread right underwear leg, Thread/unthread left underwear leg, Pull underwear up/down Pants- Performed by patient: Thread/unthread right pants leg, Thread/unthread left pants leg Pants- Performed by helper: Pull pants up/down                      Lower body assist Assist for lower body dressing: Touching or steadying  assistance (Randy [name] > 75%)      Toileting Toileting   Toileting steps completed by patient: Adjust clothing prior to toileting, Performs perineal hygiene, Adjust clothing after toileting Toileting steps completed by helper: Performs perineal hygiene    Toileting assist Assist level: Touching or steadying assistance (Randy [name].75%)   Transfers Chair/bed transfer   Chair/bed transfer method: Lateral scoot Chair/bed transfer assist level: Touching or steadying assistance (Randy [name] > 75%) (mod A for uphill  transfers) Chair/bed transfer assistive device: Sliding board     Locomotion Ambulation Ambulation activity did not occur: Safety/medical concerns         Wheelchair   Type: Manual Max wheelchair distance: 150 Assist Level: No help, No cues, assistive device, takes more than reasonable amount of time  Cognition Comprehension Comprehension assist level: Follows basic conversation/direction with no assist  Expression Expression assist level: Expresses basic needs/ideas: With no assist  Social Interaction Social Interaction assist level: Interacts appropriately 90% of the time - Needs monitoring or encouragement for participation or interaction.  Problem Solving Problem solving assist level: Solves basic 90% of the time/requires cueing < 10% of the time  Memory Memory assist level: Recognizes or recalls 75 - 89% of the time/requires cueing 10 - 24% of the time    Medical Problem List and Plan: 1. Decreased mobility and self care secondary to Left AKA with prior hx of R BKA  D/c today 2. DVT Prophylaxis/Anticoagulation: Pharmaceutical: Lovenox 3. Pain Management: Oxycodone IR, cont to wean 4. Mood: monitor for reactive depression 5. Neuropsych: This patient is capable of making decisions on his own behalf. 6. Skin/Wound Care: Dressing changes and ACE wrap to LLE 7. Fluids/Electrolytes/Nutrition: Monitor I/Os, meal intake, does not  need dietary consult  Eating 80-100% of meals 8. HTN: norvasc, lisinopril, coreg, with prn hydralazine  Relatively elevated in the last 24 hours, however, has been stable prior to that.  Will not make any adjustments at present and have Randy [name] follow up with his PCP to avoid hypotensive episode, especially post HD given history of low BP post HD 9. ESRD: HD managed by Nephro M-W F 10 Anemia of chronic disease: Aranesp weekly in HD  Hb 9.2 on 2/3 11. NISCM/CAD:Lipitor , ASA, Coreg 12.  Constipation-improved last BM 2/6  Increased bowel meds on  2/1  Narcotics also decreased  Stool softener increased on 2/3, with good results  LOS (Days) 8 A FACE TO FACE EVALUATION WAS PERFORMED  [name] 10/31/2015 9:26 AM

## 2015-10-31 NOTE — Plan of Care (Signed)
Problem: RH SKIN INTEGRITY Goal: RH STG ABLE TO PERFORM INCISION/WOUND CARE W/ASSISTANCE STG Able To Perform Incision/Wound Care With min Assistance.  Outcome: Completed/Met Date Met:  10/31/15 No dressing needed per surgeon

## 2015-11-02 ENCOUNTER — Ambulatory Visit (INDEPENDENT_AMBULATORY_CARE_PROVIDER_SITE_OTHER): Payer: Medicare Other | Admitting: Cardiology

## 2015-11-02 ENCOUNTER — Encounter: Payer: Self-pay | Admitting: Cardiology

## 2015-11-02 VITALS — BP 160/68 | HR 68 | Wt 134.0 lb

## 2015-11-02 DIAGNOSIS — I10 Essential (primary) hypertension: Secondary | ICD-10-CM

## 2015-11-02 DIAGNOSIS — R0789 Other chest pain: Secondary | ICD-10-CM

## 2015-11-02 DIAGNOSIS — I2510 Atherosclerotic heart disease of native coronary artery without angina pectoris: Secondary | ICD-10-CM | POA: Diagnosis not present

## 2015-11-02 DIAGNOSIS — E785 Hyperlipidemia, unspecified: Secondary | ICD-10-CM

## 2015-11-02 DIAGNOSIS — I5042 Chronic combined systolic (congestive) and diastolic (congestive) heart failure: Secondary | ICD-10-CM | POA: Diagnosis not present

## 2015-11-02 DIAGNOSIS — I350 Nonrheumatic aortic (valve) stenosis: Secondary | ICD-10-CM

## 2015-11-02 MED ORDER — LISINOPRIL 10 MG PO TABS: 10.0000 mg | ORAL_TABLET | Freq: Two times a day (BID) | ORAL | Status: DC

## 2015-11-02 MED ORDER — ATORVASTATIN CALCIUM 80 MG PO TABS: 80.0000 mg | ORAL_TABLET | Freq: Every day | ORAL | Status: AC

## 2015-11-02 NOTE — Patient Instructions (Signed)
Your physician recommends that you schedule a follow-up appointment in: 1 month with Dr Harl Bowie     INCREASE Lisinopril to 10 mg twice a day     INCREASE Atorvastatin to 80 mg at dinner      If you need a refill on your cardiac medications before your next appointment, please call your pharmacy.      Thank you for choosing [facility] !

## 2015-11-02 NOTE — Progress Notes (Signed)
Patient ID: PAXTON CARLONE, male   DOB: Jan 23, 1932, 83 y.o.   MRN: [id number]     Clinical Summary Mr. Mulcahy is a 82 y.o.male seen today for follow up of the following medical problems.   1. Chronic combined systolic/diastolic HF - echo [id number] LVEF 30-35% with restrictive diasotlic function, prior echo 35-40% - denies any SOB, denies edema - compliant with meds   2. ESRD - MWF goes to HD. Can have some low blood pressures during dialysis.   3. HTN - when checks in AM typically 190/90s before taking meds, later in the day typically 130s/50s later in the day. Reports during high bp's in AM can have chest pain at times.   4. CAD - cath 07/2014 with diffuse mainly moderate disease, most significnat disease 80% ramus that was managed medically - admit 07/2015 with NSTEMI, troponins peaked to 1.09. There was some evidence of fluid overload, symptoms reportedly resolved with dialysis.   - recent admit with chest pain in setting of severely elevated blood pressures, mildly elevated troponins. He was treated medically.  - continues to have chest pain, occurs only in AM when his bp's are typically elevated   5. Hyperlipidemia - compliant with statin  6. Aortic stenosis - mild to moderate by echo 07/2015. AVA 1.2, mean gradient 16. Probable component of low output contributing to the numbers.  - no recent SOB or DOE   7. PAD - followed by vascular, s/p amputation of bother right and left lower extremities    Past Medical History  Diagnosis Date  . Hypercholesterolemia   . Gout   . Nonischemic cardiomyopathy (HCC)     LVEF 35-40%  . Anemia of chronic disease   . Essential hypertension   . Arthritis   . Peripheral vascular disease ([place])     Status post right below knee amputation for a nonhealing wound of the right foot 12/2014  . History of pneumonia 2014  . CAD (coronary artery disease)     Moderate multivessel disease 07/2015 - managed medically  . History of  myopericarditis 2015    07/2015  . Stroke [facility])     "they said I did"  "I did not know anything about it"  . ESRD on hemodialysis [facility])     M/W/F in [employer] - Dr. Florene Glen  . Constipation   . History of blood transfusion   . Pneumonia   . Acute myopericarditis 2015     No Known Allergies   Current Outpatient Prescriptions  Medication Sig Dispense Refill  . amLODipine (NORVASC) 10 MG tablet Take 0.5 tablets (5 mg total) by mouth daily.    Marland Kitchen aspirin 81 MG tablet Take 81 mg by mouth daily.    Marland Kitchen atorvastatin (LIPITOR) 40 MG tablet Take 1 tablet (40 mg total) by mouth daily at 6 PM. 30 tablet 1  . calcitRIOL (ROCALTROL) 0.5 MCG capsule Take 1 capsule (0.5 mcg total) by mouth daily. 30 capsule 3  . carvedilol (COREG) 25 MG tablet Take 1 tablet (25 mg total) by mouth 2 (two) times daily with a meal. 60 tablet 1  . docusate sodium (COLACE) 100 MG capsule Take 2 capsules (200 mg total) by mouth daily. 10 capsule 0  . gabapentin (NEURONTIN) 100 MG capsule Take 1 capsule (100 mg total) by mouth at bedtime.    . isosorbide mononitrate (IMDUR) 30 MG 24 hr tablet Take 0.5 tablets (15 mg total) by mouth at bedtime. 45 tablet 3  . lidocaine-prilocaine (EMLA) cream Apply  1 application topically as needed. For dialysis  10  . lisinopril (PRINIVIL,ZESTRIL) 10 MG tablet Take 1 tablet by mouth daily.  2  . LUMIGAN 0.01 % SOLN Place 1 drop into both eyes at bedtime.  11  . multivitamin (RENA-VIT) TABS tablet Take 1 tablet by mouth daily.    Marland Kitchen oxyCODONE (OXY IR/ROXICODONE) 5 MG immediate release tablet Take 1 tablet (5 mg total) by mouth every 6 (six) hours as needed for moderate pain or severe pain. 50 tablet 0  . pantoprazole (PROTONIX) 40 MG tablet Take 1 tablet (40 mg total) by mouth daily at 12 noon.    . polyvinyl alcohol (LIQUIFILM TEARS) 1.4 % ophthalmic solution Place 1 drop into both eyes as needed for dry eyes. 15 mL 0  . senna-docusate (SENOKOT-S) 8.6-50 MG tablet Take 2 tablets by mouth at  bedtime. Available over the counter--for constipation.    . sevelamer carbonate (RENVELA) 800 MG tablet Take 1,600-2,400 mg by mouth See admin instructions. Take 3 tablets (2400 mg) with meals and 2 tablets (1600 mg) with snacks    . SIMBRINZA 1-0.2 % SUSP Place 1 drop into both eyes 2 (two) times daily.  11   No current facility-administered medications for this visit.     Past Surgical History  Procedure Laterality Date  . Knee arthroscopy Right 2007  . Back surgery    . Hemorrhoid surgery  1970's  . Ganglion cyst excision  01/03/2012    Procedure: REMOVAL GANGLION OF WRIST;  Surgeon: Scherry Ran, MD;  Location: [facility];  Service: General;  Laterality: Right;  . Colonoscopy    . Av fistula placement  08/24/2012    Procedure: ARTERIOVENOUS (AV) FISTULA CREATION;  Surgeon: Rosetta Posner, MD;  Location: [place];  Service: Vascular;  Laterality: Left;  . Insertion of dialysis catheter Right      neck  . Insertion of dialysis catheter  10/19/2012    Procedure: INSERTION OF DIALYSIS CATHETER;  Surgeon: Rosetta Posner, MD;  Location: [place];  Service: Vascular;  Laterality: N/A;  REMOVE TEMPORARY CATH  . Cholecystectomy    . Lumbar disc surgery  2004  . Left heart catheterization with coronary angiogram N/A 08/17/2014    Procedure: LEFT HEART CATHETERIZATION WITH CORONARY ANGIOGRAM;  Surgeon: Larey Dresser, MD;  Location: [facility] CATH LAB;  Service: Cardiovascular;  Laterality: N/A;  . Colonoscopy Left 09/26/2014    Procedure: COLONOSCOPY;  Surgeon: Arta Silence, MD;  Location: [facility] ENDOSCOPY;  Service: Endoscopy;  Laterality: Left;  . Amputation Right 01/05/2015    Procedure: AMPUTATION BELOW KNEE;  Surgeon: Angelia Mould, MD;  Location: [place];  Service: Vascular;  Laterality: Right;  . Capd removal N/A 03/13/2015    Procedure: CONTINUOUS AMBULATORY PERITONEAL DIALYSIS  (CAPD) CATHETER REMOVAL;  Surgeon: Coralie Keens, MD;  Location: [place];  Service: General;  Laterality: N/A;  . Cardiac  catheterization    . Above knee leg amputation Left 10/10/2015  . Amputation Left 10/10/2015    Procedure: AMPUTATION ABOVE KNEE LEFT;  Surgeon: Angelia Mould, MD;  Location: [place];  Service: Vascular;  Laterality: Left;     No Known Allergies    Family History  Problem Relation Age of Onset  . Arthritis    . Cancer    . Kidney disease    . Anesthesia problems Neg Hx   . Hypotension Neg Hx   . Malignant hyperthermia Neg Hx   . Pseudochol deficiency Neg Hx   .  Cancer Sister   . Colon cancer Brother   . Colon cancer Brother      Social History Mr. Carroll reports that he quit smoking about 17 years ago. His smoking use included Cigarettes. He has a 30 pack-year smoking history. He quit smokeless tobacco use about 21 years ago. His smokeless tobacco use included Chew. Mr. Kolenovic reports that he does not drink alcohol.   Review of Systems CONSTITUTIONAL: No weight loss, fever, chills, weakness or fatigue.  HEENT: Eyes: No visual loss, blurred vision, double vision or yellow sclerae.No hearing loss, sneezing, congestion, runny nose or sore throat.  SKIN: No rash or itching.  CARDIOVASCULAR: per hpi RESPIRATORY: No shortness of breath, cough or sputum.  GASTROINTESTINAL: No anorexia, nausea, vomiting or diarrhea. No abdominal pain or blood.  GENITOURINARY: No burning on urination, no polyuria NEUROLOGICAL: No headache, dizziness, syncope, paralysis, ataxia, numbness or tingling in the extremities. No change in bowel or bladder control.  MUSCULOSKELETAL: No muscle, back pain, joint pain or stiffness.  LYMPHATICS: No enlarged nodes. No history of splenectomy.  PSYCHIATRIC: No history of depression or anxiety.  ENDOCRINOLOGIC: No reports of sweating, cold or heat intolerance. No polyuria or polydipsia.  Marland Kitchen   Physical Examination Filed Vitals:   11/02/15 0931  BP: 160/68  Pulse: 68   Filed Vitals:   11/02/15 0931  Weight: 134 lb (60.782 kg)    Gen: resting comfortably,  no acute distress HEENT: no scleral icterus, pupils equal round and reactive, no palptable cervical adenopathy,  CV: RRR, no m/r/g, no jvd Resp: Clear to auscultation bilaterally GI: abdomen is soft, non-tender, non-distended, normal bowel sounds, no hepatosplenomegaly MSK: extremities are warm, no edema.  Skin: warm, no rash Neuro:  no focal deficits Psych: appropriate affect   Diagnostic Studies  07/2015 echo Study Conclusions  - Left ventricle: The cavity size was normal. Wall thickness was increased increased in a pattern of mild to moderate LVH. Incidentally noted transverse false tendon in LV. Systolic function was moderately to severely reduced. The estimated ejection fraction was in the range of 30% to 35%. Diffuse hypokinesis. There is severe hypokinesis of the basal-midinferolateral and inferior myocardium. Doppler parameters are consistent with restrictive physiology, indicative of decreased left ventricular diastolic compliance and/or increased left atrial pressure. - Aortic valve: Moderately calcified annulus. Trileaflet; mildly calcified leaflets. Cusp separation was reduced. There was moderate stenosis. There was trivial regurgitation. Mean gradient (S): 16 mm Hg. VTI ratio of LVOT to aortic valve: 0.4. Valve area (VTI): 1.24 cm^2. Valve area (Vmax): 1.21 cm^2. - Mitral valve: Calcified annulus. There was trivial regurgitation. - Left atrium: The atrium was moderately dilated. - Right atrium: The atrium was moderately to severely dilated. Central venous pressure (est): 3 mm Hg. - Atrial septum: No defect or patent foramen ovale was identified. - Tricuspid valve: There was mild regurgitation. - Pulmonary arteries: PA peak pressure: 38 mm Hg (S). - Pericardium, extracardiac: There was no pericardial effusion.  Impressions:  - Mild to moderate LVH with LVEF approximately 30%, diffuse hypokinesis, most severe in the mid to basal  inferior and inferolateral wall. Diastolic filling pattern consistent with restrictive physiology. Moderate left atrial enlargement. Moderate calcific aortic stenosis with trivial aortic regurgitation. Mild tricuspid regurgitation with PASP 38 mmHg. Moderate to severe right atrial enlargement. Compared to the previous study from December 2015, there has been further reduction in LVEF, and progression in degree of aortic stenosis.   07/2014 cath Procedural Findings: Hemodynamics:  AO 129/76  Coronary angiography: Coronary dominance:  right  Left mainstem: Short, no significant disease.   Left anterior descending (LAD): There was a moderate D1 that branched early with 40% ostial stenosis. This was followed by a large septal perforator. There was 40-50% proximal LAD stenosis at the takeoff of the septal perforator. 30% mid LAD stenosis at D2.   Left circumflex (LCx): There was a large ramus that branched early into a superior and and inferior division, both vessels were moderate in size. Both divisions of the ramus had relatively up to 80% proximal stenosis. The AV LCx itself had luminal irregularities.   Right coronary artery (RCA): There was an early large acute marginal with 40% proximal and 40% mid vessel stenosis. The RCA had diffuse luminal irregularities and up to 40% mid vessel stenosis.   Left ventriculography: Not done, echo was done earlier today.   Final Conclusions: Most significant disease appeared to be 80% proximal stenoses in the moderate superior and inferior divisions of the ramus. These lesions do not look like culprits for ACS (plaque rupture). I think that the main process here is likely myopericarditis in the setting of renal disease and ?inadequate dialysis (does PD at home). I will continue high dose aspirin 650 q8 hrs and will give 1 dose of colchicine. Stop heparin drip with small percardial effusion and cycle troponin to  peak.     Assessment and Plan   1. Chronic combined systolic/diastolic HF - appears euvolemic no current symptoms, though fairly sedentary lifestyle - work to optimize medical therapy and then repeat echo, will need to consider discussion about ICD at that time. Given his advanced comorbidities and age does not seem like an optimal candidate  - increase lisionpril to 10mg  bid, will take bid dosing to see if helps with high bp's in early AM  2. ESRD - continue HD per renal.   3. CAD - notes some chest pains, interstingly they are only first thing in the morning and resolve after taking his medications. We will add lisionpril 10mg  at night to see if helps lower early AM bp's.   4. Hyperlipidemia - in setting of PAD and CAD change to atorva 80mg  daily. .   5. Aortic stenosis - mild to moderate by echo 07/2015, continue to monitor.  6. PAD - per vascular  7. HTN - elevated in AM only, increase lisionpril to 10mg  bid.    F/u 1 month     Arnoldo Lenis, M.D.

## 2015-11-03 ENCOUNTER — Telehealth: Payer: Self-pay | Admitting: Cardiology

## 2015-11-03 DIAGNOSIS — I4819 Other persistent atrial fibrillation: Secondary | ICD-10-CM

## 2015-11-03 DIAGNOSIS — Z79899 Other long term (current) drug therapy: Secondary | ICD-10-CM

## 2015-11-03 MED ORDER — AMIODARONE HCL 200 MG PO TABS
ORAL_TABLET | ORAL | Status: DC
Start: 2015-11-03 — End: 2015-11-03

## 2015-11-03 NOTE — Telephone Encounter (Signed)
Per home health nurse, Malachy Mood, Mr. Bujnowski had a level 1 interaction last night. The medications in question are amlodopine and lipitor.   Malachy Mood wanted to make Dr. Harl Bowie aware of this interaction.   If the patient needs to make any medication changes please call Malachy Mood directly.

## 2015-11-03 NOTE — Telephone Encounter (Signed)
Your physician recommends that you schedule a follow-up appointment in: 1 month   START Amiodarone 400 mg twice a day for 4 weeks, THEN take 200 mg daily   Get lab work today : CMET, TSH        Thank you for choosing [facility] !

## 2015-11-03 NOTE — Telephone Encounter (Signed)
Disregard note below regarding amiodarone and labs WRONG PT     Dr Harl Bowie says lipitor and amlodipine are ok together (crestor is the problem) HHN Malachy Mood made aware

## 2015-11-04 ENCOUNTER — Encounter (HOSPITAL_COMMUNITY): Payer: Self-pay | Admitting: Emergency Medicine

## 2015-11-04 ENCOUNTER — Emergency Department (HOSPITAL_COMMUNITY)
Admission: EM | Admit: 2015-11-04 | Discharge: 2015-11-04 | Disposition: A | Discharge status: Home / Self Care | Payer: Medicare Other | Attending: Emergency Medicine | Admitting: Emergency Medicine

## 2015-11-04 ENCOUNTER — Emergency Department (HOSPITAL_COMMUNITY): Payer: Medicare Other

## 2015-11-04 DIAGNOSIS — I120 Hypertensive chronic kidney disease with stage 5 chronic kidney disease or end stage renal disease: Secondary | ICD-10-CM | POA: Diagnosis not present

## 2015-11-04 DIAGNOSIS — N186 End stage renal disease: Secondary | ICD-10-CM | POA: Diagnosis not present

## 2015-11-04 DIAGNOSIS — R42 Dizziness and giddiness: Secondary | ICD-10-CM | POA: Insufficient documentation

## 2015-11-04 DIAGNOSIS — R079 Chest pain, unspecified: Secondary | ICD-10-CM | POA: Diagnosis present

## 2015-11-04 DIAGNOSIS — R011 Cardiac murmur, unspecified: Secondary | ICD-10-CM | POA: Insufficient documentation

## 2015-11-04 DIAGNOSIS — Z8673 Personal history of transient ischemic attack (TIA), and cerebral infarction without residual deficits: Secondary | ICD-10-CM | POA: Diagnosis not present

## 2015-11-04 DIAGNOSIS — Z8701 Personal history of pneumonia (recurrent): Secondary | ICD-10-CM | POA: Diagnosis not present

## 2015-11-04 DIAGNOSIS — E7800 Pure hypercholesterolemia, unspecified: Secondary | ICD-10-CM | POA: Diagnosis not present

## 2015-11-04 DIAGNOSIS — I252 Old myocardial infarction: Secondary | ICD-10-CM | POA: Diagnosis not present

## 2015-11-04 DIAGNOSIS — Z87891 Personal history of nicotine dependence: Secondary | ICD-10-CM | POA: Insufficient documentation

## 2015-11-04 DIAGNOSIS — I2510 Atherosclerotic heart disease of native coronary artery without angina pectoris: Secondary | ICD-10-CM | POA: Diagnosis not present

## 2015-11-04 DIAGNOSIS — M109 Gout, unspecified: Secondary | ICD-10-CM | POA: Insufficient documentation

## 2015-11-04 DIAGNOSIS — Z7982 Long term (current) use of aspirin: Secondary | ICD-10-CM | POA: Insufficient documentation

## 2015-11-04 DIAGNOSIS — K5900 Constipation, unspecified: Secondary | ICD-10-CM | POA: Diagnosis not present

## 2015-11-04 DIAGNOSIS — D631 Anemia in chronic kidney disease: Secondary | ICD-10-CM | POA: Diagnosis not present

## 2015-11-04 DIAGNOSIS — Z89511 Acquired absence of right leg below knee: Secondary | ICD-10-CM | POA: Diagnosis not present

## 2015-11-04 DIAGNOSIS — Z992 Dependence on renal dialysis: Secondary | ICD-10-CM | POA: Insufficient documentation

## 2015-11-04 DIAGNOSIS — E785 Hyperlipidemia, unspecified: Secondary | ICD-10-CM | POA: Diagnosis not present

## 2015-11-04 DIAGNOSIS — M1990 Unspecified osteoarthritis, unspecified site: Secondary | ICD-10-CM | POA: Insufficient documentation

## 2015-11-04 DIAGNOSIS — Z79899 Other long term (current) drug therapy: Secondary | ICD-10-CM | POA: Diagnosis not present

## 2015-11-04 DIAGNOSIS — I10 Essential (primary) hypertension: Secondary | ICD-10-CM

## 2015-11-04 DIAGNOSIS — Z9889 Other specified postprocedural states: Secondary | ICD-10-CM | POA: Insufficient documentation

## 2015-11-04 LAB — BASIC METABOLIC PANEL
Anion gap: 13 (ref 5–15)
BUN: 47 mg/dL — AB (ref 6–20)
CALCIUM: 9.6 mg/dL (ref 8.9–10.3)
CHLORIDE: 97 mmol/L — AB (ref 101–111)
CO2: 30 mmol/L (ref 22–32)
CREATININE: 5.98 mg/dL — AB (ref 0.61–1.24)
GFR calc non Af Amer: 8 mL/min — ABNORMAL LOW (ref >60)
GFR, EST AFRICAN AMERICAN: 9 mL/min — AB (ref >60)
Glucose, Bld: 155 mg/dL — ABNORMAL HIGH (ref 65–99)
Potassium: 4.3 mmol/L (ref 3.5–5.1)
SODIUM: 140 mmol/L (ref 135–145)

## 2015-11-04 LAB — TROPONIN I: Troponin I: 0.1 ng/mL — ABNORMAL HIGH (ref <0.031)

## 2015-11-04 LAB — CBC WITH DIFFERENTIAL/PLATELET
BASOS PCT: 0 %
Basophils Absolute: 0 10*3/uL (ref 0.0–0.1)
EOS ABS: 0.1 10*3/uL (ref 0.0–0.7)
Eosinophils Relative: 1 %
HEMATOCRIT: 39.1 % (ref 39.0–52.0)
HEMOGLOBIN: 11.2 g/dL — AB (ref 13.0–17.0)
LYMPHS ABS: 0.7 10*3/uL (ref 0.7–4.0)
Lymphocytes Relative: 9 %
MCH: 29.2 pg (ref 26.0–34.0)
MCHC: 28.6 g/dL — AB (ref 30.0–36.0)
MCV: 101.8 fL — ABNORMAL HIGH (ref 78.0–100.0)
MONO ABS: 0.6 10*3/uL (ref 0.1–1.0)
MONOS PCT: 8 %
NEUTROS PCT: 82 %
Neutro Abs: 6.3 10*3/uL (ref 1.7–7.7)
Platelets: 291 10*3/uL (ref 150–400)
RBC: 3.84 MIL/uL — ABNORMAL LOW (ref 4.22–5.81)
RDW: 19.9 % — AB (ref 11.5–15.5)
WBC: 7.7 10*3/uL (ref 4.0–10.5)

## 2015-11-04 NOTE — ED Provider Notes (Signed)
CSN: [id number]     Arrival date & time 11/04/15  1042 History  By signing my name below, I, Stephania Fragmin, attest that this documentation has been prepared under the direction and in the presence of Tanna Furry, MD. Electronically Signed: Stephania Fragmin, ED Scribe. 11/04/2015. 1:38 PM.    Chief Complaint  Patient presents with  . Chest Pain   The history is provided by the patient. No language interpreter was used.    HPI Comments: Randy [name] is a 82 y.o. male with a history of CAD, aortic stenosis, ESRD (currently on dialysis M-W-F), HLD, HTN, nonischemic cardiomyopathy, and anemia, who presents to the Emergency Department brought in by ambulance, complaining of intermittent, burning central chest pain that began about 30 minutes after he woke up this morning. He states he checks his blood pressure every morning upon waking, and his blood pressure was high today at [id number] systolic; he states this was taken before he had taken his HTN medication. He reports he is currently on HTN medication BID. He reports he had seen his cardiologist, Dr. Harl Bowie, in the past week, with some medication changes. Patient had last been to dialysis yesterday. He notes some lightheadedness after having dialysis done but denies any lightheadedness with walking. Patient states he had a left AKA last month 1/17 and is currently in rehabilitation in [place]. He denies a history of MI. He denies SOB, pain with inspiration, or nausea.  Past Medical History  Diagnosis Date  . Hypercholesterolemia   . Gout   . Nonischemic cardiomyopathy (HCC)     LVEF 35-40%  . Anemia of chronic disease   . Essential hypertension   . Arthritis   . Peripheral vascular disease ([place])     Status post right below knee amputation for a nonhealing wound of the right foot 12/2014  . History of pneumonia 2014  . CAD (coronary artery disease)     Moderate multivessel disease 07/2015 - managed medically  . History of myopericarditis 2015    07/2015   . Stroke [facility])     "they said I did"  "I did not know anything about it"  . ESRD on hemodialysis [facility])     M/W/F in [place] - Dr. Florene Glen  . Constipation   . History of blood transfusion   . Pneumonia   . Acute myopericarditis 2015   Past Surgical History  Procedure Laterality Date  . Knee arthroscopy Right 2007  . Back surgery    . Hemorrhoid surgery  1970's  . Ganglion cyst excision  01/03/2012    Procedure: REMOVAL GANGLION OF WRIST;  Surgeon: Scherry Ran, MD;  Location: [facility];  Service: General;  Laterality: Right;  . Colonoscopy    . Av fistula placement  08/24/2012    Procedure: ARTERIOVENOUS (AV) FISTULA CREATION;  Surgeon: Rosetta Posner, MD;  Location: [place];  Service: Vascular;  Laterality: Left;  . Insertion of dialysis catheter Right      neck  . Insertion of dialysis catheter  10/19/2012    Procedure: INSERTION OF DIALYSIS CATHETER;  Surgeon: Rosetta Posner, MD;  Location: [place];  Service: Vascular;  Laterality: N/A;  REMOVE TEMPORARY CATH  . Cholecystectomy    . Lumbar disc surgery  2004  . Left heart catheterization with coronary angiogram N/A 08/17/2014    Procedure: LEFT HEART CATHETERIZATION WITH CORONARY ANGIOGRAM;  Surgeon: Larey Dresser, MD;  Location: [facility] CATH LAB;  Service: Cardiovascular;  Laterality: N/A;  . Colonoscopy Left  09/26/2014    Procedure: COLONOSCOPY;  Surgeon: Arta Silence, MD;  Location: [facility] ENDOSCOPY;  Service: Endoscopy;  Laterality: Left;  . Amputation Right 01/05/2015    Procedure: AMPUTATION BELOW KNEE;  Surgeon: Angelia Mould, MD;  Location: [place];  Service: Vascular;  Laterality: Right;  . Capd removal N/A 03/13/2015    Procedure: CONTINUOUS AMBULATORY PERITONEAL DIALYSIS  (CAPD) CATHETER REMOVAL;  Surgeon: Coralie Keens, MD;  Location: [place];  Service: General;  Laterality: N/A;  . Cardiac catheterization    . Above knee leg amputation Left 10/10/2015  . Amputation Left 10/10/2015    Procedure: AMPUTATION ABOVE KNEE LEFT;   Surgeon: Angelia Mould, MD;  [facility];  Service: Vascular;  Laterality: Left;   Family History  Problem Relation Age of Onset  . Arthritis    . Cancer    . Kidney disease    . Anesthesia problems Neg Hx   . Hypotension Neg Hx   . Malignant hyperthermia Neg Hx   . Pseudochol deficiency Neg Hx   . Cancer Sister   . Colon cancer Brother   . Colon cancer Brother    Social History  Substance Use Topics  . Smoking status: Former Smoker -- 1.00 packs/day for 30 years    Types: Cigarettes    Quit date: 08/19/1998  . Smokeless tobacco: Former Systems developer    Types: Chew    Quit date: 12/31/1993  . Alcohol Use: No    Review of Systems  Constitutional: Negative for fever, chills, diaphoresis, appetite change and fatigue.  HENT: Negative for mouth sores, sore throat and trouble swallowing.   Eyes: Negative for visual disturbance.  Respiratory: Negative for cough, chest tightness, shortness of breath and wheezing.   Cardiovascular: Positive for chest pain.  Gastrointestinal: Negative for nausea, vomiting, abdominal pain, diarrhea and abdominal distention.  Endocrine: Negative for polydipsia, polyphagia and polyuria.  Genitourinary: Negative for dysuria, frequency and hematuria.  Musculoskeletal: Negative for gait problem.  Skin: Negative for color change, pallor and rash.  Neurological: Negative for dizziness, syncope and headaches.  Hematological: Does not bruise/bleed easily.  Psychiatric/Behavioral: Negative for behavioral problems and confusion.    Allergies  Review of patient's allergies indicates no known allergies.  Home Medications   Prior to Admission medications   Medication Sig Start Date End Date Taking? Authorizing Provider  amLODipine (NORVASC) 10 MG tablet Take 0.5 tablets (5 mg total) by mouth daily. 10/31/15  Yes Ivan Anchors Love, PA-C  aspirin 81 MG tablet Take 81 mg by mouth daily.   Yes Historical Provider, MD  atorvastatin (LIPITOR) 80 MG tablet Take 1  tablet (80 mg total) by mouth daily. 11/02/15  Yes Arnoldo Lenis, MD  calcitRIOL (ROCALTROL) 0.5 MCG capsule Take 1 capsule (0.5 mcg total) by mouth daily. 06/23/14  Yes Lucia Gaskins, MD  carvedilol (COREG) 25 MG tablet Take 1 tablet (25 mg total) by mouth 2 (two) times daily with a meal. 10/31/15  Yes Ivan Anchors Love, PA-C  docusate sodium (COLACE) 100 MG capsule Take 2 capsules (200 mg total) by mouth daily. 10/31/15  Yes Ivan Anchors Love, PA-C  gabapentin (NEURONTIN) 100 MG capsule Take 1 capsule (100 mg total) by mouth at bedtime. 10/23/15  Yes Barton Dubois, MD  isosorbide mononitrate (IMDUR) 30 MG 24 hr tablet Take 0.5 tablets (15 mg total) by mouth at bedtime. 09/20/15  Yes Arnoldo Lenis, MD  lidocaine-prilocaine (EMLA) cream Apply 1 application topically as needed. For dialysis 10/06/15  Yes Historical Provider,  MD  lisinopril (PRINIVIL,ZESTRIL) 10 MG tablet Take 1 tablet (10 mg total) by mouth 2 (two) times daily. 11/02/15  Yes Arnoldo Lenis, MD  LUMIGAN 0.01 % SOLN Place 1 drop into both eyes at bedtime. 09/18/15  Yes Historical Provider, MD  multivitamin (RENA-VIT) TABS tablet Take 1 tablet by mouth daily.   Yes Historical Provider, MD  oxyCODONE (OXY IR/ROXICODONE) 5 MG immediate release tablet Take 1 tablet (5 mg total) by mouth every 6 (six) hours as needed for moderate pain or severe pain. 10/31/15  Yes Ivan Anchors Love, PA-C  pantoprazole (PROTONIX) 40 MG tablet Take 1 tablet (40 mg total) by mouth daily at 12 noon. 10/23/15  Yes Barton Dubois, MD  polyvinyl alcohol (LIQUIFILM TEARS) 1.4 % ophthalmic solution Place 1 drop into both eyes as needed for dry eyes. 10/31/15  Yes Ivan Anchors Love, PA-C  senna-docusate (SENOKOT-S) 8.6-50 MG tablet Take 2 tablets by mouth at bedtime. Available over the counter--for constipation. 10/31/15  Yes Ivan Anchors Love, PA-C  sevelamer carbonate (RENVELA) 800 MG tablet Take 1,600-2,400 mg by mouth See admin instructions. Take 3 tablets (2400 mg) with meals and 2 tablets  (1600 mg) with snacks   Yes Historical Provider, MD  SIMBRINZA 1-0.2 % SUSP Place 1 drop into both eyes 2 (two) times daily. 09/18/15  Yes Historical Provider, MD   BP 159/71 mmHg  Pulse 87  Temp(Src) 98 F (36.7 C)  Resp 19  Wt 128 lb (58.06 kg)  SpO2 93% Physical Exam  Constitutional: He is oriented to person, place, and time. He appears well-developed and well-nourished. No distress.  HENT:  Head: Normocephalic.  Eyes: Conjunctivae are normal. Pupils are equal, round, and reactive to light. No scleral icterus.  Neck: Normal range of motion. Neck supple. No thyromegaly present.  Cardiovascular: Normal rate and regular rhythm.  Exam reveals no gallop and no friction rub.   Murmur heard. 3/6 systolic murmur, heard best in the suprasternal region - does not go into carotid.  Pulmonary/Chest: Effort normal and breath sounds normal. No respiratory distress. He has no wheezes. He has no rales.  Abdominal: Soft. Bowel sounds are normal. He exhibits no distension. There is no tenderness. There is no rebound.  Musculoskeletal: Normal range of motion.  AV graft left forearm with normal thrill and bruit  AKA on left and BKA on right.  Neurological: He is alert and oriented to person, place, and time.  Skin: Skin is warm and dry. No rash noted.  Psychiatric: He has a normal mood and affect. His behavior is normal.  Nursing note and vitals reviewed.   ED Course  Procedures (including critical care time)  DIAGNOSTIC STUDIES: Oxygen Saturation is 96% on RA, normal by my interpretation.    COORDINATION OF CARE: 11:19 AM - Discussed treatment plan with pt at bedside which includes diagnostic testing. Pt verbalized understanding and agreed to plan.   Labs Review Labs Reviewed  CBC WITH DIFFERENTIAL/PLATELET - Abnormal; Notable for the following:    RBC 3.84 (*)    Hemoglobin 11.2 (*)    MCV 101.8 (*)    MCHC 28.6 (*)    RDW 19.9 (*)    All other components within normal limits  BASIC  METABOLIC PANEL - Abnormal; Notable for the following:    Chloride 97 (*)    Glucose, Bld 155 (*)    BUN 47 (*)    Creatinine, Ser 5.98 (*)    GFR calc non Af Amer 8 (*)  GFR calc Af Amer 9 (*)    All other components within normal limits  TROPONIN I - Abnormal; Notable for the following:    Troponin I 0.10 (*)    All other components within normal limits    Imaging Review Dg Chest 2 View  11/04/2015  CLINICAL DATA:  Chest pain and hypertension EXAM: CHEST  2 VIEW COMPARISON:  October 21, 2015 and August 04, 2015 FINDINGS: There is chronic appearing volume loss in the left lower lobe region. Lungs elsewhere clear. Heart is mildly enlarged with pulmonary vascularity within normal limits. No adenopathy. There is atherosclerotic calcification throughout the aorta. No bone lesions. IMPRESSION: Chronic volume loss left lower lobe. Superimposed pneumonia in the left base cannot be excluded radiographically. Lungs elsewhere clear. Stable cardiac prominence. Extensive calcification throughout the aorta. Electronically Signed   By: Lowella Grip III M.D.   On: 11/04/2015 11:56   I have personally reviewed and evaluated these images and lab results as part of my medical decision-making.   EKG Interpretation   Date/Time:  Saturday November 04 2015 10:47:47 EST Ventricular Rate:  80 PR Interval:  209 QRS Duration: 99 QT Interval:  411 QTC Calculation: 474 R Axis:   59 Text Interpretation:  Sinus rhythm Sinus pause Probable left atrial  enlargement LVH with secondary repolarization abnormality Confirmed by  Jeneen Rinks  MD, [place] ([id number]) on 11/04/2015 10:53:46 AM      MDM   Final diagnoses:  Essential hypertension   Pt seen by cardiologist, Dr. Harl Bowie 2/9 with following impression.::    HTN   - when checks in AM typically 190/90s before taking meds, later in the day typically    130s/50s later in the day. Reports during high bp's in AM can have chest pain at    times.    CAD   - cath  07/2014 with diffuse mainly moderate disease, most significnat disease   80% ramus that was managed medically   - admit 07/2015 with NSTEMI, troponins peaked to 1.09. There was some evidence of   fluid overload, symptoms reportedly resolved with dialysis.     - recent admit with chest pain in setting of severely elevated blood pressures, mildly   elevated troponins. He was treated medically.    - continues to have chest pain, occurs only in AM when his bp's are typically elevated  His description of his pain sounds quite similar this morning. He states it was a "burning" it comes and goes. He did not feel any symptoms until after he had taken his blood pressure. It sounds like there is an anxiety component here as well. His EKG does not show any changes.  His blood pressure has improved markedly less than an hour or 2 after taking his blood pressure medications. He may do better if he takes his medication and weights one hour before checking his blood pressures. His enzymes are normal he would be appropriate for continued outpatient treatment. He has only been on his p.m. dose of lisinopril from Dr. Harl Bowie for one dose starting last night.  13:38:  Patient remains a symptomatic. He states "God yes" when asked of the is ready to go home. Reassuring studies here. I think this is his continued anxiety associated with his high blood pressure readings. I recommended he check his blood pressure after taking his morning medications.   Tanna Furry, MD 11/04/15 [phone]

## 2015-11-04 NOTE — ED Notes (Signed)
Pt c/o central cp this am. denies other sx. 324mg  asa po given in route.

## 2015-11-04 NOTE — Discharge Instructions (Signed)
Take your blood pressure medication in the morning. Wait one hour before checking her blood pressure.  Continue your medication changed by Dr. Harl Bowie on Friday including an evening dose of lisinopril  Return to ER with any new or worsening symptoms

## 2015-11-06 ENCOUNTER — Encounter: Payer: Self-pay | Admitting: Vascular Surgery

## 2015-11-07 ENCOUNTER — Emergency Department (HOSPITAL_COMMUNITY): Payer: Medicare Other

## 2015-11-07 ENCOUNTER — Inpatient Hospital Stay (HOSPITAL_COMMUNITY)
Admission: EM | Admit: 2015-11-07 | Discharge: 2015-11-10 | DRG: 304 | Disposition: A | Discharge status: Home / Self Care | Payer: Medicare Other | Attending: Family Medicine | Admitting: Family Medicine

## 2015-11-07 ENCOUNTER — Encounter (HOSPITAL_COMMUNITY): Payer: Self-pay | Admitting: *Deleted

## 2015-11-07 DIAGNOSIS — I160 Hypertensive urgency: Secondary | ICD-10-CM | POA: Diagnosis not present

## 2015-11-07 DIAGNOSIS — I2510 Atherosclerotic heart disease of native coronary artery without angina pectoris: Secondary | ICD-10-CM | POA: Diagnosis present

## 2015-11-07 DIAGNOSIS — Z992 Dependence on renal dialysis: Secondary | ICD-10-CM

## 2015-11-07 DIAGNOSIS — Z8701 Personal history of pneumonia (recurrent): Secondary | ICD-10-CM

## 2015-11-07 DIAGNOSIS — Z89612 Acquired absence of left leg above knee: Secondary | ICD-10-CM

## 2015-11-07 DIAGNOSIS — Z87891 Personal history of nicotine dependence: Secondary | ICD-10-CM

## 2015-11-07 DIAGNOSIS — I480 Paroxysmal atrial fibrillation: Secondary | ICD-10-CM | POA: Diagnosis present

## 2015-11-07 DIAGNOSIS — E785 Hyperlipidemia, unspecified: Secondary | ICD-10-CM | POA: Diagnosis present

## 2015-11-07 DIAGNOSIS — I132 Hypertensive heart and chronic kidney disease with heart failure and with stage 5 chronic kidney disease, or end stage renal disease: Secondary | ICD-10-CM | POA: Diagnosis present

## 2015-11-07 DIAGNOSIS — I429 Cardiomyopathy, unspecified: Secondary | ICD-10-CM | POA: Diagnosis present

## 2015-11-07 DIAGNOSIS — R079 Chest pain, unspecified: Secondary | ICD-10-CM | POA: Diagnosis present

## 2015-11-07 DIAGNOSIS — Z89511 Acquired absence of right leg below knee: Secondary | ICD-10-CM

## 2015-11-07 DIAGNOSIS — R0789 Other chest pain: Secondary | ICD-10-CM | POA: Diagnosis not present

## 2015-11-07 DIAGNOSIS — I739 Peripheral vascular disease, unspecified: Secondary | ICD-10-CM | POA: Diagnosis present

## 2015-11-07 DIAGNOSIS — I252 Old myocardial infarction: Secondary | ICD-10-CM

## 2015-11-07 DIAGNOSIS — E440 Moderate protein-calorie malnutrition: Secondary | ICD-10-CM | POA: Insufficient documentation

## 2015-11-07 DIAGNOSIS — N186 End stage renal disease: Secondary | ICD-10-CM

## 2015-11-07 DIAGNOSIS — Z7982 Long term (current) use of aspirin: Secondary | ICD-10-CM

## 2015-11-07 DIAGNOSIS — Z89519 Acquired absence of unspecified leg below knee: Secondary | ICD-10-CM

## 2015-11-07 DIAGNOSIS — Z800 Family history of malignant neoplasm of digestive organs: Secondary | ICD-10-CM

## 2015-11-07 DIAGNOSIS — I350 Nonrheumatic aortic (valve) stenosis: Secondary | ICD-10-CM | POA: Diagnosis present

## 2015-11-07 DIAGNOSIS — Z8673 Personal history of transient ischemic attack (TIA), and cerebral infarction without residual deficits: Secondary | ICD-10-CM

## 2015-11-07 DIAGNOSIS — I5042 Chronic combined systolic (congestive) and diastolic (congestive) heart failure: Secondary | ICD-10-CM | POA: Diagnosis present

## 2015-11-07 DIAGNOSIS — E7800 Pure hypercholesterolemia, unspecified: Secondary | ICD-10-CM | POA: Diagnosis present

## 2015-11-07 DIAGNOSIS — D638 Anemia in other chronic diseases classified elsewhere: Secondary | ICD-10-CM | POA: Diagnosis present

## 2015-11-07 MED ORDER — ONDANSETRON HCL 4 MG/2ML IJ SOLN
4.0000 mg | Freq: Once | INTRAMUSCULAR | Status: AC
  Administered 2015-11-08: 4 mg via INTRAMUSCULAR
  Filled 2015-11-07: qty 2

## 2015-11-07 MED ORDER — HYDRALAZINE HCL 20 MG/ML IJ SOLN
5.0000 mg | Freq: Once | INTRAMUSCULAR | Status: AC
  Administered 2015-11-08: 5 mg via INTRAVENOUS
  Filled 2015-11-07: qty 1

## 2015-11-07 NOTE — ED Provider Notes (Signed)
CSN: [id number]     Arrival date & time 11/07/15  2143 History   By signing my name below, I, Altamease Oiler, attest that this documentation has been prepared under the direction and in the presence of Merryl Hacker, MD. Electronically Signed: Altamease Oiler, ED Scribe. 11/07/2015. 11:37 PM   Chief Complaint  Patient presents with  . Hypertension   The history is provided by the patient. No language interpreter was used.   Randy [name] is a 82 y.o. male with PMHx of HTN, hypercholesteremia, ESRD on M/W/F hemodialysis, CAD, myopericarditis, and stroke  who presents to the Emergency Department complaining of hypertension with onset this morning. Pt states that his blood pressures have been running high at home despite taking his antihypertensive. His pressure typically runs near 157/73 but has been near 178/101 today. Associated symptoms include dizziness, nausea, vomiting, and burning chest pain. Pt denies headache, SOB, abdominal pain, and diarrhea. He had a full dialysis treatment yesterday. His PCP is Dr. Lorriane Shire.  Of note, patient had an above-the-knee amputation of the left leg in January. He subsequently had admission for chest pain. At that time he was evaluated by Dr. Haroldine Laws.  Chest pain thought to be related to hypertensive urgency. He has been medically managed. Last cardiac cath was in November 2015. He was subsequently seen on Saturday with a similar presentation of hypertension and chest pain. He was evaluated by cardiology in the emergency room. They adjusted his blood pressure medications by adding was syncopal at night. He reports compliance with his medications.  Past Medical History  Diagnosis Date  . Hypercholesterolemia   . Gout   . Nonischemic cardiomyopathy (HCC)     LVEF 35-40%  . Anemia of chronic disease   . Essential hypertension   . Arthritis   . Peripheral vascular disease ([place])     Status post right below knee amputation for a nonhealing wound of the  right foot 12/2014  . History of pneumonia 2014  . CAD (coronary artery disease)     Moderate multivessel disease 07/2015 - managed medically  . History of myopericarditis 2015    07/2015  . Stroke [facility])     "they said I did"  "I did not know anything about it"  . ESRD on hemodialysis [facility])     M/W/F in [place] - Dr. Florene Glen  . Constipation   . History of blood transfusion   . Pneumonia   . Acute myopericarditis 2015   Past Surgical History  Procedure Laterality Date  . Knee arthroscopy Right 2007  . Back surgery    . Hemorrhoid surgery  1970's  . Ganglion cyst excision  01/03/2012    Procedure: REMOVAL GANGLION OF WRIST;  Surgeon: Scherry Ran, MD;  Location: [facility];  Service: General;  Laterality: Right;  . Colonoscopy    . Av fistula placement  08/24/2012    Procedure: ARTERIOVENOUS (AV) FISTULA CREATION;  Surgeon: Rosetta Posner, MD;  Location: [place];  Service: Vascular;  Laterality: Left;  . Insertion of dialysis catheter Right      neck  . Insertion of dialysis catheter  10/19/2012    Procedure: INSERTION OF DIALYSIS CATHETER;  Surgeon: Rosetta Posner, MD;  Location: [place];  Service: Vascular;  Laterality: N/A;  REMOVE TEMPORARY CATH  . Cholecystectomy    . Lumbar disc surgery  2004  . Left heart catheterization with coronary angiogram N/A 08/17/2014    Procedure: LEFT HEART CATHETERIZATION WITH CORONARY ANGIOGRAM;  Surgeon: Larey Dresser, MD;  Location: [facility] CATH LAB;  Service: Cardiovascular;  Laterality: N/A;  . Colonoscopy Left 09/26/2014    Procedure: COLONOSCOPY;  Surgeon: Arta Silence, MD;  Location: [facility] ENDOSCOPY;  Service: Endoscopy;  Laterality: Left;  . Amputation Right 01/05/2015    Procedure: AMPUTATION BELOW KNEE;  Surgeon: Angelia Mould, MD;  Location: [facility];  Service: Vascular;  Laterality: Right;  . Capd removal N/A 03/13/2015    Procedure: CONTINUOUS AMBULATORY PERITONEAL DIALYSIS  (CAPD) CATHETER REMOVAL;  Surgeon: Coralie Keens, MD;  Location:  [place];  Service: General;  Laterality: N/A;  . Cardiac catheterization    . Above knee leg amputation Left 10/10/2015  . Amputation Left 10/10/2015    Procedure: AMPUTATION ABOVE KNEE LEFT;  Surgeon: Angelia Mould, MD;  Location: [facility] OR;  Service: Vascular;  Laterality: Left;   Family History  Problem Relation Age of Onset  . Arthritis    . Cancer    . Kidney disease    . Anesthesia problems Neg Hx   . Hypotension Neg Hx   . Malignant hyperthermia Neg Hx   . Pseudochol deficiency Neg Hx   . Cancer Sister   . Colon cancer Brother   . Colon cancer Brother    Social History  Substance Use Topics  . Smoking status: Former Smoker -- 1.00 packs/day for 30 years    Types: Cigarettes    Quit date: 08/19/1998  . Smokeless tobacco: Former Systems developer    Types: Chew    Quit date: 12/31/1993  . Alcohol Use: No    Review of Systems  Constitutional: Negative for fever.  Respiratory: Negative for shortness of breath.   Cardiovascular: Positive for chest pain.  Gastrointestinal: Positive for nausea and vomiting. Negative for abdominal pain and diarrhea.  Neurological: Positive for dizziness.  All other systems reviewed and are negative.     Allergies  Review of patient's allergies indicates no known allergies.  Home Medications   Prior to Admission medications   Medication Sig Start Date End Date Taking? Authorizing Provider  amLODipine (NORVASC) 10 MG tablet Take 0.5 tablets (5 mg total) by mouth daily. 10/31/15  Yes Ivan Anchors Love, PA-C  aspirin 81 MG tablet Take 81 mg by mouth daily.   Yes Historical Provider, MD  atorvastatin (LIPITOR) 80 MG tablet Take 1 tablet (80 mg total) by mouth daily. 11/02/15  Yes Arnoldo Lenis, MD  carvedilol (COREG) 25 MG tablet Take 1 tablet (25 mg total) by mouth 2 (two) times daily with a meal. 10/31/15  Yes Ivan Anchors Love, PA-C  docusate sodium (COLACE) 100 MG capsule Take 2 capsules (200 mg total) by mouth daily. 10/31/15  Yes Ivan Anchors Love, PA-C   gabapentin (NEURONTIN) 100 MG capsule Take 1 capsule (100 mg total) by mouth at bedtime. Patient taking differently: Take 100 mg by mouth at bedtime as needed (for neuropathy).  10/23/15  Yes Barton Dubois, MD  isosorbide mononitrate (IMDUR) 30 MG 24 hr tablet Take 0.5 tablets (15 mg total) by mouth at bedtime. 09/20/15  Yes Arnoldo Lenis, MD  lidocaine-prilocaine (EMLA) cream Apply 1 application topically as needed. For dialysis 10/06/15  Yes Historical Provider, MD  lisinopril (PRINIVIL,ZESTRIL) 10 MG tablet Take 1 tablet (10 mg total) by mouth 2 (two) times daily. 11/02/15  Yes Arnoldo Lenis, MD  LUMIGAN 0.01 % SOLN Place 1 drop into both eyes at bedtime. 09/18/15  Yes Historical Provider, MD  multivitamin (RENA-VIT) TABS tablet Take 1 tablet  by mouth daily.   Yes Historical Provider, MD  oxyCODONE (OXY IR/ROXICODONE) 5 MG immediate release tablet Take 1 tablet (5 mg total) by mouth every 6 (six) hours as needed for moderate pain or severe pain. 10/31/15  Yes Ivan Anchors Love, PA-C  pantoprazole (PROTONIX) 40 MG tablet Take 1 tablet (40 mg total) by mouth daily at 12 noon. 10/23/15  Yes Barton Dubois, MD  polyvinyl alcohol (LIQUIFILM TEARS) 1.4 % ophthalmic solution Place 1 drop into both eyes as needed for dry eyes. 10/31/15  Yes Ivan Anchors Love, PA-C  sevelamer carbonate (RENVELA) 800 MG tablet Take 1,600-2,400 mg by mouth See admin instructions. Take 3 tablets (2400 mg) with meals and 2 tablets (1600 mg) with snacks   Yes Historical Provider, MD  SIMBRINZA 1-0.2 % SUSP Place 1 drop into both eyes 2 (two) times daily. 09/18/15  Yes Historical Provider, MD  calcitRIOL (ROCALTROL) 0.5 MCG capsule Take 1 capsule (0.5 mcg total) by mouth daily. Patient not taking: Reported on 11/07/2015 06/23/14   Lucia Gaskins, MD  senna-docusate (SENOKOT-S) 8.6-50 MG tablet Take 2 tablets by mouth at bedtime. Available over the counter--for constipation. Patient not taking: Reported on 11/07/2015 10/31/15   Ivan Anchors  Love, PA-C   BP 181/96 mmHg  Pulse 82  Temp(Src) 97.6 F (36.4 C) (Oral)  Resp 20  Ht 5\' 9"  (1.753 m)  Wt 128 lb (58.06 kg)  BMI 18.89 kg/m2  SpO2 96% Physical Exam  Constitutional: He is oriented to person, place, and time. No distress.  Chronically ill-appearing, no acute distress  HENT:  Head: Normocephalic and atraumatic.  Cardiovascular: Normal rate and regular rhythm.   Murmur heard. Pulmonary/Chest: Effort normal and breath sounds normal. No respiratory distress. He has no wheezes.  Fine crackles bilateral bases  Abdominal: Soft. Bowel sounds are normal. There is no tenderness. There is no rebound.  Musculoskeletal: He exhibits no edema.  Left AKA, incision with staples in place, clean dry and intact, right BKA  Neurological: He is alert and oriented to person, place, and time.  Skin: Skin is warm and dry.  Psychiatric: He has a normal mood and affect.  Nursing note and vitals reviewed.   ED Course  Procedures (including critical care time)  CRITICAL CARE Performed by: Merryl Hacker   Total critical care time: 40 minutes  Critical care time was exclusive of separately billable procedures and treating other patients.  Critical care was necessary to treat or prevent imminent or life-threatening deterioration.  Critical care was time spent personally by me on the following activities: development of treatment plan with patient and/or surrogate as well as nursing, discussions with consultants, evaluation of patient's response to treatment, examination of patient, obtaining history from patient or surrogate, ordering and performing treatments and interventions, ordering and review of laboratory studies, ordering and review of radiographic studies, pulse oximetry and re-evaluation of patient's condition.  DIAGNOSTIC STUDIES: Oxygen Saturation is 96% on RA,  normal by my interpretation.    COORDINATION OF CARE: 11:16 PM Discussed treatment plan which includes lab  work, CXR, EKG, Zofran, and hydralazine  with pt at bedside and pt agreed to plan.  Labs Review Labs Reviewed  CBC WITH DIFFERENTIAL/PLATELET - Abnormal; Notable for the following:    RBC 3.92 (*)    Hemoglobin 11.6 (*)    HCT 38.5 (*)    RDW 19.9 (*)    Neutro Abs 8.3 (*)    All other components within normal limits  COMPREHENSIVE METABOLIC PANEL -  Abnormal; Notable for the following:    Chloride 96 (*)    Glucose, Bld 135 (*)    BUN 48 (*)    Creatinine, Ser 7.73 (*)    Albumin 3.3 (*)    ALT 14 (*)    GFR calc non Af Amer 6 (*)    GFR calc Af Amer 7 (*)    Anion gap 16 (*)    All other components within normal limits  TROPONIN I - Abnormal; Notable for the following:    Troponin I 0.18 (*)    All other components within normal limits  LIPASE, BLOOD    Imaging Review Dg Abd Acute W/chest  11/08/2015  CLINICAL DATA:  Central chest pain with weakness and vomiting tonight. Bilateral lower extremity amputee. History of cardiomyopathy, hypertension, coronary disease, stroke, hemodialysis, former smoker. EXAM: DG ABDOMEN ACUTE W/ 1V CHEST COMPARISON:  11/04/2015 FINDINGS: Cardiac enlargement with mild pulmonary vascular congestion. Slight interstitial pattern to the lung bases suggest mild edema. Small left pleural effusion with basilar atelectasis. Calcified and tortuous aorta. Scattered gas and stool in the colon. No small or large bowel distention. No free intra-abdominal air. No abnormal air-fluid levels. No radiopaque stones. Degenerative changes in the lumbar spine and hips. Diffuse vascular calcifications. IMPRESSION: Cardiac enlargement with mild pulmonary vascular congestion and mild interstitial edema. Small left pleural effusion with basilar atelectasis. Normal nonobstructive bowel gas pattern. Electronically Signed   By: Lucienne Capers M.D.   On: 11/08/2015 01:43   I have personally reviewed and evaluated these images and lab results as part of my medical  decision-making.   EKG Interpretation   Date/Time:  Wednesday November 08 2015 00:05:17 EST Ventricular Rate:  105 PR Interval:  141 QRS Duration: 98 QT Interval:  394 QTC Calculation: 521 R Axis:   52 Text Interpretation:  Ectopic atrial tachycardia, unifocal Probable LVH  with secondary repol abnrm Anterior Q waves, possibly due to LVH Prolonged  QT interval Prolonged QT , new Otherwise no significant change Confirmed  by HORTON  MD, Painter ([id number]) on 11/08/2015 12:17:37 AM      EKG Interpretation  Date/Time:  Wednesday November 08 2015 01:17:40 EST Ventricular Rate:  100 PR Interval:  206 QRS Duration: 101 QT Interval:  405 QTC Calculation: 522 R Axis:   52 Text Interpretation:  Sinus tachycardia Ventricular premature complex Probable LVH with secondary repol abnrm Anterior Q waves, possibly due to LVH ST depression, consider ischemia, diffuse lds Prolonged QT interval Confirmed by HORTON  MD, Peyton ([id number]) on 11/08/2015 1:22:26 AM        MDM   Final diagnoses:  Hypertensive urgency  Other chest pain  ESRD (end stage renal disease) ([place])    Patient presents with chest pain in the setting of high blood pressure. Dialyzed on Monday. Recent history of the same and was seen in the ER on Saturday. He had blood pressure medication adjustment that time. Is hypertensive. Otherwise nontoxic. Patient given aspirin. He has a known history of coronary artery disease. Troponin is 0.18. Troponin on Saturday was 0.1. History of mildly elevated troponins in the setting of end-stage renal disease. He appears mildly volume overloaded on his chest x-ray. He reports some improvement of chest pain with nitroglycerin ointment.    Discussed the case with the cardiology fellow. He agrees with the nitroglycerin drip and admission for hypertensive urgency. No transfer to [facility] at this time. He will need to be evaluated by cardiology tomorrow and receive dialysis. Likely combination  of  hypertensive urgency and volume overload.  Discussed with Dr. Darrick Meigs who will admit the patient.     Merryl Hacker, MD 11/08/15 [phone]

## 2015-11-07 NOTE — ED Notes (Signed)
Pt states his BP has been high all day at home; pt states his BP was 198/98 at home; pt denies any pain

## 2015-11-08 ENCOUNTER — Encounter (HOSPITAL_COMMUNITY): Payer: Self-pay

## 2015-11-08 ENCOUNTER — Encounter: Payer: Medicare Other | Admitting: Vascular Surgery

## 2015-11-08 DIAGNOSIS — N186 End stage renal disease: Secondary | ICD-10-CM | POA: Diagnosis not present

## 2015-11-08 DIAGNOSIS — I161 Hypertensive emergency: Secondary | ICD-10-CM

## 2015-11-08 DIAGNOSIS — R0789 Other chest pain: Secondary | ICD-10-CM | POA: Diagnosis not present

## 2015-11-08 DIAGNOSIS — R7989 Other specified abnormal findings of blood chemistry: Secondary | ICD-10-CM | POA: Diagnosis not present

## 2015-11-08 DIAGNOSIS — I4891 Unspecified atrial fibrillation: Secondary | ICD-10-CM | POA: Diagnosis not present

## 2015-11-08 DIAGNOSIS — I160 Hypertensive urgency: Secondary | ICD-10-CM | POA: Diagnosis not present

## 2015-11-08 DIAGNOSIS — E440 Moderate protein-calorie malnutrition: Secondary | ICD-10-CM | POA: Insufficient documentation

## 2015-11-08 DIAGNOSIS — Z992 Dependence on renal dialysis: Secondary | ICD-10-CM

## 2015-11-08 LAB — CBC WITH DIFFERENTIAL/PLATELET
Basophils Absolute: 0 10*3/uL (ref 0.0–0.1)
Basophils Relative: 0 %
EOS ABS: 0.1 10*3/uL (ref 0.0–0.7)
Eosinophils Relative: 1 %
HCT: 38.5 % — ABNORMAL LOW (ref 39.0–52.0)
HEMOGLOBIN: 11.6 g/dL — AB (ref 13.0–17.0)
LYMPHS ABS: 1.3 10*3/uL (ref 0.7–4.0)
LYMPHS PCT: 12 %
MCH: 29.6 pg (ref 26.0–34.0)
MCHC: 30.1 g/dL (ref 30.0–36.0)
MCV: 98.2 fL (ref 78.0–100.0)
MONOS PCT: 8 %
Monocytes Absolute: 0.8 10*3/uL (ref 0.1–1.0)
NEUTROS PCT: 79 %
Neutro Abs: 8.3 10*3/uL — ABNORMAL HIGH (ref 1.7–7.7)
Platelets: 310 10*3/uL (ref 150–400)
RBC: 3.92 MIL/uL — ABNORMAL LOW (ref 4.22–5.81)
RDW: 19.9 % — ABNORMAL HIGH (ref 11.5–15.5)
WBC: 10.4 10*3/uL (ref 4.0–10.5)

## 2015-11-08 LAB — COMPREHENSIVE METABOLIC PANEL
ALK PHOS: 76 U/L (ref 38–126)
ALK PHOS: 77 U/L (ref 38–126)
ALT: 16 U/L — ABNORMAL LOW (ref 17–63)
ALT: 14 U/L — AB (ref 17–63)
ANION GAP: 16 — AB (ref 5–15)
ANION GAP: 16 — AB (ref 5–15)
AST: 21 U/L (ref 15–41)
AST: 22 U/L (ref 15–41)
Albumin: 3.3 g/dL — ABNORMAL LOW (ref 3.5–5.0)
Albumin: 3.3 g/dL — ABNORMAL LOW (ref 3.5–5.0)
BILIRUBIN TOTAL: 0.6 mg/dL (ref 0.3–1.2)
BILIRUBIN TOTAL: 0.5 mg/dL (ref 0.3–1.2)
BUN: 48 mg/dL — ABNORMAL HIGH (ref 6–20)
BUN: 52 mg/dL — ABNORMAL HIGH (ref 6–20)
CALCIUM: 9.7 mg/dL (ref 8.9–10.3)
CALCIUM: 9.5 mg/dL (ref 8.9–10.3)
CO2: 25 mmol/L (ref 22–32)
CO2: 26 mmol/L (ref 22–32)
CREATININE: 7.88 mg/dL — AB (ref 0.61–1.24)
CREATININE: 7.73 mg/dL — AB (ref 0.61–1.24)
Chloride: 96 mmol/L — ABNORMAL LOW (ref 101–111)
Chloride: 97 mmol/L — ABNORMAL LOW (ref 101–111)
GFR, EST AFRICAN AMERICAN: 7 mL/min — AB (ref >60)
GFR, EST AFRICAN AMERICAN: 7 mL/min — AB (ref >60)
GFR, EST NON AFRICAN AMERICAN: 6 mL/min — AB (ref >60)
GFR, EST NON AFRICAN AMERICAN: 6 mL/min — AB (ref >60)
Glucose, Bld: 135 mg/dL — ABNORMAL HIGH (ref 65–99)
Glucose, Bld: 138 mg/dL — ABNORMAL HIGH (ref 65–99)
Potassium: 4.7 mmol/L (ref 3.5–5.1)
Potassium: 5 mmol/L (ref 3.5–5.1)
SODIUM: 138 mmol/L (ref 135–145)
Sodium: 138 mmol/L (ref 135–145)
TOTAL PROTEIN: 7.4 g/dL (ref 6.5–8.1)
Total Protein: 7.4 g/dL (ref 6.5–8.1)

## 2015-11-08 LAB — CBC
HEMATOCRIT: 37.3 % — AB (ref 39.0–52.0)
HEMOGLOBIN: 11.1 g/dL — AB (ref 13.0–17.0)
MCH: 29.3 pg (ref 26.0–34.0)
MCHC: 29.8 g/dL — AB (ref 30.0–36.0)
MCV: 98.4 fL (ref 78.0–100.0)
Platelets: 308 10*3/uL (ref 150–400)
RBC: 3.79 MIL/uL — ABNORMAL LOW (ref 4.22–5.81)
RDW: 19.9 % — ABNORMAL HIGH (ref 11.5–15.5)
WBC: 10.4 10*3/uL (ref 4.0–10.5)

## 2015-11-08 LAB — TROPONIN I
TROPONIN I: 0.33 ng/mL — AB (ref <0.031)
TROPONIN I: 0.2 ng/mL — AB (ref <0.031)
TROPONIN I: 0.18 ng/mL — AB (ref <0.031)
Troponin I: 0.25 ng/mL — ABNORMAL HIGH (ref <0.031)

## 2015-11-08 LAB — LIPASE, BLOOD: LIPASE: 26 U/L (ref 11–51)

## 2015-11-08 LAB — MRSA PCR SCREENING: MRSA by PCR: NEGATIVE

## 2015-11-08 MED ORDER — MORPHINE SULFATE (PF) 4 MG/ML IV SOLN
4.0000 mg | Freq: Once | INTRAVENOUS | Status: AC
  Administered 2015-11-08: 4 mg via INTRAVENOUS
  Filled 2015-11-08: qty 1

## 2015-11-08 MED ORDER — HYDRALAZINE HCL 25 MG PO TABS: 100.0000 mg | ORAL_TABLET | Freq: Three times a day (TID) | ORAL | Status: DC

## 2015-11-08 MED ORDER — SEVELAMER CARBONATE 800 MG PO TABS: 1600.0000 mg | ORAL_TABLET | ORAL | Status: DC

## 2015-11-08 MED ORDER — SODIUM CHLORIDE 0.9 % IV SOLN
INTRAVENOUS | Status: DC
  Administered 2015-11-08: 04:00:00 via INTRAVENOUS

## 2015-11-08 MED ORDER — HEPARIN SODIUM (PORCINE) 5000 UNIT/ML IJ SOLN
5000.0000 [IU] | Freq: Three times a day (TID) | INTRAMUSCULAR | Status: DC
  Administered 2015-11-08 – 2015-11-10 (×6): 5000 [IU] via SUBCUTANEOUS
  Filled 2015-11-08 (×6): qty 1

## 2015-11-08 MED ORDER — ASPIRIN 81 MG PO CHEW
81.0000 mg | CHEWABLE_TABLET | Freq: Every day | ORAL | Status: DC
  Administered 2015-11-08 – 2015-11-09 (×2): 81 mg via ORAL
  Filled 2015-11-08 (×2): qty 1

## 2015-11-08 MED ORDER — SODIUM CHLORIDE 0.9 % IV SOLN: 100.0000 mL | INTRAVENOUS | Status: DC | PRN

## 2015-11-08 MED ORDER — NITROGLYCERIN IN D5W 200-5 MCG/ML-% IV SOLN
0.0000 ug/min | INTRAVENOUS | Status: DC
  Administered 2015-11-08: 5 ug/min via INTRAVENOUS
  Filled 2015-11-08: qty 250

## 2015-11-08 MED ORDER — LISINOPRIL 10 MG PO TABS
20.0000 mg | ORAL_TABLET | Freq: Every day | ORAL | Status: DC
  Filled 2015-11-08: qty 2

## 2015-11-08 MED ORDER — EPOETIN ALFA 2000 UNIT/ML IJ SOLN: 2000.0000 [IU] | INTRAMUSCULAR | Status: DC

## 2015-11-08 MED ORDER — ONDANSETRON HCL 4 MG PO TABS: 4.0000 mg | ORAL_TABLET | Freq: Four times a day (QID) | ORAL | Status: DC | PRN

## 2015-11-08 MED ORDER — DOCUSATE SODIUM 100 MG PO CAPS
200.0000 mg | ORAL_CAPSULE | Freq: Every day | ORAL | Status: DC
  Administered 2015-11-08 – 2015-11-09 (×2): 200 mg via ORAL
  Filled 2015-11-08 (×2): qty 2

## 2015-11-08 MED ORDER — PENTAFLUOROPROP-TETRAFLUOROETH EX AERO: 1.0000 "application " | INHALATION_SPRAY | CUTANEOUS | Status: DC | PRN

## 2015-11-08 MED ORDER — ACETAMINOPHEN 650 MG RE SUPP: 650.0000 mg | Freq: Four times a day (QID) | RECTAL | Status: DC | PRN

## 2015-11-08 MED ORDER — OXYCODONE HCL 5 MG PO TABS: 5.0000 mg | ORAL_TABLET | Freq: Four times a day (QID) | ORAL | Status: DC | PRN

## 2015-11-08 MED ORDER — LIDOCAINE HCL (PF) 1 % IJ SOLN: 5.0000 mL | INTRAMUSCULAR | Status: DC | PRN

## 2015-11-08 MED ORDER — ACETAMINOPHEN 325 MG PO TABS
650.0000 mg | ORAL_TABLET | Freq: Four times a day (QID) | ORAL | Status: DC | PRN
Start: 2015-11-08 — End: 2015-11-10

## 2015-11-08 MED ORDER — ONDANSETRON HCL 4 MG/2ML IJ SOLN
4.0000 mg | Freq: Four times a day (QID) | INTRAMUSCULAR | Status: DC | PRN
  Administered 2015-11-08: 4 mg via INTRAVENOUS
  Filled 2015-11-08: qty 2

## 2015-11-08 MED ORDER — CARVEDILOL 12.5 MG PO TABS
25.0000 mg | ORAL_TABLET | Freq: Two times a day (BID) | ORAL | Status: DC
  Administered 2015-11-08 – 2015-11-09 (×4): 25 mg via ORAL
  Filled 2015-11-08 (×4): qty 2

## 2015-11-08 MED ORDER — CLONIDINE HCL 0.2 MG PO TABS
0.2000 mg | ORAL_TABLET | Freq: Two times a day (BID) | ORAL | Status: DC
  Administered 2015-11-08 – 2015-11-09 (×2): 0.2 mg via ORAL
  Filled 2015-11-08 (×2): qty 1

## 2015-11-08 MED ORDER — ATORVASTATIN CALCIUM 40 MG PO TABS
80.0000 mg | ORAL_TABLET | Freq: Every day | ORAL | Status: DC
  Administered 2015-11-08 – 2015-11-09 (×2): 80 mg via ORAL
  Filled 2015-11-08: qty 2
  Filled 2015-11-08: qty 4

## 2015-11-08 MED ORDER — HYDRALAZINE HCL 25 MG PO TABS
25.0000 mg | ORAL_TABLET | Freq: Four times a day (QID) | ORAL | Status: DC
  Administered 2015-11-08: 25 mg via ORAL
  Filled 2015-11-08: qty 1

## 2015-11-08 MED ORDER — HYDRALAZINE HCL 25 MG PO TABS
50.0000 mg | ORAL_TABLET | Freq: Three times a day (TID) | ORAL | Status: DC
  Administered 2015-11-08 – 2015-11-09 (×5): 50 mg via ORAL
  Filled 2015-11-08 (×5): qty 2

## 2015-11-08 MED ORDER — NEPRO/CARBSTEADY PO LIQD
237.0000 mL | Freq: Two times a day (BID) | ORAL | Status: DC
  Administered 2015-11-09 (×2): 237 mL via ORAL

## 2015-11-08 MED ORDER — NITROGLYCERIN 2 % TD OINT
1.0000 [in_us] | TOPICAL_OINTMENT | Freq: Once | TRANSDERMAL | Status: AC
Start: 2015-11-08 — End: 2015-11-08
  Administered 2015-11-08: 1 [in_us] via TOPICAL
  Filled 2015-11-08: qty 1

## 2015-11-08 MED ORDER — LISINOPRIL 10 MG PO TABS
10.0000 mg | ORAL_TABLET | Freq: Two times a day (BID) | ORAL | Status: DC
  Administered 2015-11-08 (×2): 10 mg via ORAL
  Filled 2015-11-08 (×2): qty 1

## 2015-11-08 MED ORDER — PANTOPRAZOLE SODIUM 40 MG PO TBEC
40.0000 mg | DELAYED_RELEASE_TABLET | Freq: Every day | ORAL | Status: DC
  Administered 2015-11-08 – 2015-11-09 (×2): 40 mg via ORAL
  Filled 2015-11-08 (×2): qty 1

## 2015-11-08 MED ORDER — LATANOPROST 0.005 % OP SOLN
1.0000 [drp] | Freq: Every day | OPHTHALMIC | Status: DC
  Administered 2015-11-08 – 2015-11-09 (×2): 1 [drp] via OPHTHALMIC
  Filled 2015-11-08: qty 2.5

## 2015-11-08 MED ORDER — NEPRO/CARBSTEADY PO LIQD
237.0000 mL | Freq: Three times a day (TID) | ORAL | Status: DC
  Administered 2015-11-08: 237 mL via ORAL

## 2015-11-08 MED ORDER — CALCITRIOL 0.25 MCG PO CAPS
0.5000 ug | ORAL_CAPSULE | Freq: Every day | ORAL | Status: DC
  Administered 2015-11-08 – 2015-11-09 (×2): 0.5 ug via ORAL
  Filled 2015-11-08 (×2): qty 2

## 2015-11-08 MED ORDER — AMLODIPINE BESYLATE 5 MG PO TABS
10.0000 mg | ORAL_TABLET | Freq: Every day | ORAL | Status: DC
  Administered 2015-11-08 – 2015-11-09 (×2): 10 mg via ORAL
  Filled 2015-11-08 (×2): qty 2

## 2015-11-08 MED ORDER — LIDOCAINE-PRILOCAINE 2.5-2.5 % EX CREA: 1.0000 "application " | TOPICAL_CREAM | CUTANEOUS | Status: DC | PRN

## 2015-11-08 MED ORDER — SEVELAMER CARBONATE 800 MG PO TABS
1600.0000 mg | ORAL_TABLET | ORAL | Status: DC
  Administered 2015-11-08 – 2015-11-09 (×3): 1600 mg via ORAL
  Filled 2015-11-08 (×2): qty 2

## 2015-11-08 MED ORDER — ASPIRIN 325 MG PO TABS
325.0000 mg | ORAL_TABLET | Freq: Once | ORAL | Status: AC
  Administered 2015-11-08: 325 mg via ORAL
  Filled 2015-11-08: qty 1

## 2015-11-08 MED ORDER — SEVELAMER CARBONATE 800 MG PO TABS
2400.0000 mg | ORAL_TABLET | Freq: Three times a day (TID) | ORAL | Status: DC
  Administered 2015-11-08 – 2015-11-09 (×5): 2400 mg via ORAL
  Filled 2015-11-08 (×6): qty 3

## 2015-11-08 MED ORDER — NEPRO/CARBSTEADY PO LIQD
237.0000 mL | Freq: Two times a day (BID) | ORAL | Status: DC
  Administered 2015-11-08: 237 mL via ORAL

## 2015-11-08 MED ORDER — GABAPENTIN 100 MG PO CAPS
100.0000 mg | ORAL_CAPSULE | Freq: Every evening | ORAL | Status: DC | PRN
  Filled 2015-11-08: qty 1

## 2015-11-08 NOTE — H&P (Signed)
PCP:   [name], MD   Chief Complaint:  Shortness of breath  HPI:  82 year old male who  has a past medical history of Hypercholesterolemia; Gout; Nonischemic cardiomyopathy ([place]); Anemia of chronic disease; Essential hypertension; Arthritis; Peripheral vascular disease ([place]); History of pneumonia (2014); CAD (coronary artery disease); History of myopericarditis (2015); Stroke [facility]); ESRD on hemodialysis ([place]); Constipation; History of blood transfusion; Pneumonia; and Acute myopericarditis (2015). Today came to the hospital with chest pain and shortness of breath. Patient also has elevated blood pressure which has been running high despite taking these medications. Patient was recently seen by cardiology, at that time he had mild elevation of troponin which was thought to demand ischemia from uncontrolled hypertension.  He has been medically managed. Last cardiac cath was in November 2015. He was  seen on Saturday with a similar presentation of hypertension and chest pain. He was evaluated by cardiology in the emergency room And discharged home. Today in the ED again patient found to have mild elevation of troponin 0.18, ED physician called cardiology fellow at [facility] who recommended to admit the patient at [facility] hospital and start nitroglycerin infusion for hypertensive urgency.  Allergies:  No Known Allergies    Past Medical History  Diagnosis Date  . Hypercholesterolemia   . Gout   . Nonischemic cardiomyopathy (HCC)     LVEF 35-40%  . Anemia of chronic disease   . Essential hypertension   . Arthritis   . Peripheral vascular disease ([place])     Status post right below knee amputation for a nonhealing wound of the right foot 12/2014  . History of pneumonia 2014  . CAD (coronary artery disease)     Moderate multivessel disease 07/2015 - managed medically  . History of myopericarditis 2015    07/2015  . Stroke [facility])     "they said I did"  "I did not know anything about it"   . ESRD on hemodialysis [facility])     M/W/F in [place] - Dr. Florene Glen  . Constipation   . History of blood transfusion   . Pneumonia   . Acute myopericarditis 2015    Past Surgical History  Procedure Laterality Date  . Knee arthroscopy Right 2007  . Back surgery    . Hemorrhoid surgery  1970's  . Ganglion cyst excision  01/03/2012    Procedure: REMOVAL GANGLION OF WRIST;  Surgeon: Scherry Ran, MD;  Location: [facility];  Service: General;  Laterality: Right;  . Colonoscopy    . Av fistula placement  08/24/2012    Procedure: ARTERIOVENOUS (AV) FISTULA CREATION;  Surgeon: Rosetta Posner, MD;  Location: [place];  Service: Vascular;  Laterality: Left;  . Insertion of dialysis catheter Right      neck  . Insertion of dialysis catheter  10/19/2012    Procedure: INSERTION OF DIALYSIS CATHETER;  Surgeon: Rosetta Posner, MD;  Location: [place];  Service: Vascular;  Laterality: N/A;  REMOVE TEMPORARY CATH  . Cholecystectomy    . Lumbar disc surgery  2004  . Left heart catheterization with coronary angiogram N/A 08/17/2014    Procedure: LEFT HEART CATHETERIZATION WITH CORONARY ANGIOGRAM;  Surgeon: Larey Dresser, MD;  Location: [facility] CATH LAB;  Service: Cardiovascular;  Laterality: N/A;  . Colonoscopy Left 09/26/2014    Procedure: COLONOSCOPY;  Surgeon: Arta Silence, MD;  Location: [facility] ENDOSCOPY;  Service: Endoscopy;  Laterality: Left;  . Amputation Right 01/05/2015    Procedure: AMPUTATION BELOW KNEE;  Surgeon: Judeth Cornfield  Scot Dock, MD;  Location: [place];  Service: Vascular;  Laterality: Right;  . Capd removal N/A 03/13/2015    Procedure: CONTINUOUS AMBULATORY PERITONEAL DIALYSIS  (CAPD) CATHETER REMOVAL;  Surgeon: Coralie Keens, MD;  Location: [place];  Service: General;  Laterality: N/A;  . Cardiac catheterization    . Above knee leg amputation Left 10/10/2015  . Amputation Left 10/10/2015    Procedure: AMPUTATION ABOVE KNEE LEFT;  Surgeon: Angelia Mould, MD;  Location: [place];  Service: Vascular;   Laterality: Left;    Prior to Admission medications   Medication Sig Start Date End Date Taking? Authorizing Provider  amLODipine (NORVASC) 10 MG tablet Take 0.5 tablets (5 mg total) by mouth daily. 10/31/15  Yes Ivan Anchors Love, PA-C  aspirin 81 MG tablet Take 81 mg by mouth daily.   Yes Historical Provider, MD  atorvastatin (LIPITOR) 80 MG tablet Take 1 tablet (80 mg total) by mouth daily. 11/02/15  Yes Arnoldo Lenis, MD  carvedilol (COREG) 25 MG tablet Take 1 tablet (25 mg total) by mouth 2 (two) times daily with a meal. 10/31/15  Yes Ivan Anchors Love, PA-C  docusate sodium (COLACE) 100 MG capsule Take 2 capsules (200 mg total) by mouth daily. 10/31/15  Yes Ivan Anchors Love, PA-C  gabapentin (NEURONTIN) 100 MG capsule Take 1 capsule (100 mg total) by mouth at bedtime. Patient taking differently: Take 100 mg by mouth at bedtime as needed (for neuropathy).  10/23/15  Yes Barton Dubois, MD  isosorbide mononitrate (IMDUR) 30 MG 24 hr tablet Take 0.5 tablets (15 mg total) by mouth at bedtime. 09/20/15  Yes Arnoldo Lenis, MD  lidocaine-prilocaine (EMLA) cream Apply 1 application topically as needed. For dialysis 10/06/15  Yes Historical Provider, MD  lisinopril (PRINIVIL,ZESTRIL) 10 MG tablet Take 1 tablet (10 mg total) by mouth 2 (two) times daily. 11/02/15  Yes Arnoldo Lenis, MD  LUMIGAN 0.01 % SOLN Place 1 drop into both eyes at bedtime. 09/18/15  Yes Historical Provider, MD  multivitamin (RENA-VIT) TABS tablet Take 1 tablet by mouth daily.   Yes Historical Provider, MD  oxyCODONE (OXY IR/ROXICODONE) 5 MG immediate release tablet Take 1 tablet (5 mg total) by mouth every 6 (six) hours as needed for moderate pain or severe pain. 10/31/15  Yes Ivan Anchors Love, PA-C  pantoprazole (PROTONIX) 40 MG tablet Take 1 tablet (40 mg total) by mouth daily at 12 noon. 10/23/15  Yes Barton Dubois, MD  polyvinyl alcohol (LIQUIFILM TEARS) 1.4 % ophthalmic solution Place 1 drop into both eyes as needed for dry eyes. 10/31/15  Yes  Ivan Anchors Love, PA-C  sevelamer carbonate (RENVELA) 800 MG tablet Take 1,600-2,400 mg by mouth See admin instructions. Take 3 tablets (2400 mg) with meals and 2 tablets (1600 mg) with snacks   Yes Historical Provider, MD  SIMBRINZA 1-0.2 % SUSP Place 1 drop into both eyes 2 (two) times daily. 09/18/15  Yes Historical Provider, MD  calcitRIOL (ROCALTROL) 0.5 MCG capsule Take 1 capsule (0.5 mcg total) by mouth daily. Patient not taking: Reported on 11/07/2015 06/23/14   Lucia Gaskins, MD  senna-docusate (SENOKOT-S) 8.6-50 MG tablet Take 2 tablets by mouth at bedtime. Available over the counter--for constipation. Patient not taking: Reported on 11/07/2015 10/31/15   Bary Leriche, PA-C    Social History:  reports that he quit smoking about 17 years ago. His smoking use included Cigarettes. He has a 30 pack-year smoking history. He quit smokeless tobacco use about 21 years ago. His  smokeless tobacco use included Chew. He reports that he does not drink alcohol or use illicit drugs.  Family History  Problem Relation Age of Onset  . Arthritis    . Cancer    . Kidney disease    . Anesthesia problems Neg Hx   . Hypotension Neg Hx   . Malignant hyperthermia Neg Hx   . Pseudochol deficiency Neg Hx   . Cancer Sister   . Colon cancer Brother   . Colon cancer Brother     Danley Danker Weights   11/07/15 2226  Weight: 58.06 kg (128 lb)    All the positives are listed in BOLD  Review of Systems:  HEENT: Headache, blurred vision, runny nose, sore throat Neck: Hypothyroidism, hyperthyroidism,,lymphadenopathy Chest : Shortness of breath, history of COPD, Asthma Heart : Chest pain, history of coronary arterey disease GI:  Nausea, vomiting, diarrhea, constipation, GERD GU: Dysuria, urgency, frequency of urination, hematuria Neuro: Stroke, seizures, syncope Psych: Depression, anxiety, hallucinations   Physical Exam: Blood pressure 186/91, pulse 88, temperature 97.6 F (36.4 C), temperature source Oral,  resp. rate 15, height 5\' 9"  (1.753 m), weight 58.06 kg (128 lb), SpO2 99 %. Constitutional:   Patient is a well-developed and well-nourished male in mild respiratory distress  and cooperative with exam. Head: Normocephalic and atraumatic Mouth: Mucus membranes moist Eyes: PERRL, EOMI, conjunctivae normal Neck: Supple, No Thyromegaly Cardiovascular:  S1-S2 regular, no murmurs Chest -bibasilar crackles  Abdominal: Soft. Non-tender, non-distended, bowel sounds are normal, no masses, organomegaly, or guarding present.  Neurological: A&O x3, Strength is normal and symmetric bilaterally, cranial nerve II-XII are grossly intact, no focal motor deficit, sensory intact to light touch bilaterally.  Extremities : Left above-knee amputation, right below-knee amputation  Labs on Admission:  Basic Metabolic Panel:  Recent Labs Lab 11/04/15 1114 11/08/15 0030  NA 140 138  K 4.3 4.7  CL 97* 96*  CO2 30 26  GLUCOSE 155* 135*  BUN 47* 48*  CREATININE 5.98* 7.73*  CALCIUM 9.6 9.5   Liver Function Tests:  Recent Labs Lab 11/08/15 0030  AST 21  ALT 14*  ALKPHOS 77  BILITOT 0.5  PROT 7.4  ALBUMIN 3.3*    Recent Labs Lab 11/08/15 0030  LIPASE 26   No results for input(s): AMMONIA in the last 168 hours. CBC:  Recent Labs Lab 11/04/15 1114 11/08/15 0030  WBC 7.7 10.4  NEUTROABS 6.3 8.3*  HGB 11.2* 11.6*  HCT 39.1 38.5*  MCV 101.8* 98.2  PLT 291 310   Cardiac Enzymes:  Recent Labs Lab 11/04/15 1114 11/08/15 0030  TROPONINI 0.10* 0.18*    BNP (last 3 results)  Recent Labs  01/08/15 1118 08/04/15 0625 10/21/15 0936  BNP >4500.0* 2452.0* 3090.9*     Radiological Exams on Admission: Dg Abd Acute W/chest  11/08/2015  CLINICAL DATA:  Central chest pain with weakness and vomiting tonight. Bilateral lower extremity amputee. History of cardiomyopathy, hypertension, coronary disease, stroke, hemodialysis, former smoker. EXAM: DG ABDOMEN ACUTE W/ 1V CHEST COMPARISON:   11/04/2015 FINDINGS: Cardiac enlargement with mild pulmonary vascular congestion. Slight interstitial pattern to the lung bases suggest mild edema. Small left pleural effusion with basilar atelectasis. Calcified and tortuous aorta. Scattered gas and stool in the colon. No small or large bowel distention. No free intra-abdominal air. No abnormal air-fluid levels. No radiopaque stones. Degenerative changes in the lumbar spine and hips. Diffuse vascular calcifications. IMPRESSION: Cardiac enlargement with mild pulmonary vascular congestion and mild interstitial edema. Small left pleural effusion with basilar  atelectasis. Normal nonobstructive bowel gas pattern. Electronically Signed   By: Lucienne Capers M.D.   On: 11/08/2015 01:43    EKG: Independently reviewed. Sinus tachycardia, nonspecific ST changes   Assessment/Plan Active Problems:   Elevated troponin   S/P BKA (below knee amputation) unilateral (HCC)   Chest pain   Status post above knee amputation of left lower extremity (HCC)   ESRD on dialysis [facility])   Hypertensive urgency, malignant   Chest pain Likely from hypertensive urgency, dyspnea from pulmonary edema. Patient started on nitroglycerin drip. Cycle cardiac enzymes. Consult cardiology in a.m.  Pulmonary edema Patient's chest x-ray shows mild interstitial edema, he has bibasilar crackles on exam. Patient currently on nitroglycerin drip Will need hemodialysis in a.m.   ESRD on hemodialysis Patient gets dialysis Monday Wednesday Friday Last tetanus is was on Monday, consult nephrology in a.m. for hemodialysis.  Hypertensive urgency Patient has elevated blood pressure, he is on multiple antihypertensive medications. Continue Coreg, lisinopril. Will increase the dose of amlodipine to 10 mg by mouth daily Start hydralazine 25 mg by mouth every 6 hours  Code status:Full code  Family discussion: No family present at bedside   Time Spent on Admission: 60 min  [facility] Pager: [phone] 11/08/2015, 3:08 AM  If 7PM-7AM, please contact night-coverage  [email or web address]  Password [email or web address]

## 2015-11-08 NOTE — Consult Note (Signed)
Reason for Consult: Hypertensive emergency, chest pain Referring Physician: PTH Cardiologist Dr. Alcario Drought is an 82 y.o. male.  HPI: This is an 82 year old male patient well-known to Dr. Harl Bowie and just seen in the office on 11/02/15. He has a history of chronic combined systolic diastolic heart failure EF 30-35% with restrictive diastolic function on echo in 07/2015. He also has ESRD on hemodialysis, hypertension that typically goes up in the a.m. and is associated with chest pain at times. He has had several admissions with hypertensive emergency and was in the ER 11/04/15 with chest pain and elevated blood pressures. His chest pain resolved once his blood pressure came down. Lisinopril was just increased in the office on 11/02/15. He also has history of CAD with cath in 07/2014 with diffuse moderate disease with 80% ramus treated medically. He had NSTEMI 07/2015 with peak troponin of 1.09 in the setting of fluid overload resolved with dialysis. He also has mild to moderate aortic stenosis on echo in 07/2015, PAD status post bilateral lower extremity amputation.  He presents to the emergency room last night with recurrent chest pain and shortness of breath, positive troponin of 0.1 8.20 in the setting of hypertensive emergency. He was placed on IV nitroglycerin. He is still uncomfortable with BP 169/108, chest burning and shortness of breath. EKG last night Sinus tachycardia with LVH, repolarization changes, Nonspecific ST changes. CXR mild vascular congestion. He is scheduled for dialysis today.     Past Medical History  Diagnosis Date  . Hypercholesterolemia   . Gout   . Nonischemic cardiomyopathy (HCC)     LVEF 35-40%  . Anemia of chronic disease   . Essential hypertension   . Arthritis   . Peripheral vascular disease ([place])     Status post right below knee amputation for a nonhealing wound of the right foot 12/2014  . History of pneumonia 2014  . CAD (coronary artery disease)      Moderate multivessel disease 07/2015 - managed medically  . History of myopericarditis 2015    07/2015  . Stroke [facility])     "they said I did"  "I did not know anything about it"  . ESRD on hemodialysis [facility])     M/W/F in [place] - Dr. Florene Glen  . Constipation   . History of blood transfusion   . Pneumonia   . Acute myopericarditis 2015    Past Surgical History  Procedure Laterality Date  . Knee arthroscopy Right 2007  . Back surgery    . Hemorrhoid surgery  1970's  . Ganglion cyst excision  01/03/2012    Procedure: REMOVAL GANGLION OF WRIST;  Surgeon: Scherry Ran, MD;  Location: [facility];  Service: General;  Laterality: Right;  . Colonoscopy    . Av fistula placement  08/24/2012    Procedure: ARTERIOVENOUS (AV) FISTULA CREATION;  Surgeon: Rosetta Posner, MD;  Location: [place];  Service: Vascular;  Laterality: Left;  . Insertion of dialysis catheter Right      neck  . Insertion of dialysis catheter  10/19/2012    Procedure: INSERTION OF DIALYSIS CATHETER;  Surgeon: Rosetta Posner, MD;  Location: [place];  Service: Vascular;  Laterality: N/A;  REMOVE TEMPORARY CATH  . Cholecystectomy    . Lumbar disc surgery  2004  . Left heart catheterization with coronary angiogram N/A 08/17/2014    Procedure: LEFT HEART CATHETERIZATION WITH CORONARY ANGIOGRAM;  Surgeon: Larey Dresser, MD;  Location: [facility] C[facility] LAB;  Service:  Cardiovascular;  Laterality: N/A;  . Colonoscopy Left 09/26/2014    Procedure: COLONOSCOPY;  Surgeon: Arta Silence, MD;  Location: [facility] ENDOSCOPY;  Service: Endoscopy;  Laterality: Left;  . Amputation Right 01/05/2015    Procedure: AMPUTATION BELOW KNEE;  Surgeon: Angelia Mould, MD;  Location: [place];  Service: Vascular;  Laterality: Right;  . Capd removal N/A 03/13/2015    Procedure: CONTINUOUS AMBULATORY PERITONEAL DIALYSIS  (CAPD) CATHETER REMOVAL;  Surgeon: Coralie Keens, MD;  Location: [place];  Service: General;  Laterality: N/A;  . Cardiac catheterization    .  Above knee leg amputation Left 10/10/2015  . Amputation Left 10/10/2015    Procedure: AMPUTATION ABOVE KNEE LEFT;  Surgeon: Angelia Mould, MD;  Location: [facility] OR;  Service: Vascular;  Laterality: Left;    Family History  Problem Relation Age of Onset  . Arthritis    . Cancer    . Kidney disease    . Anesthesia problems Neg Hx   . Hypotension Neg Hx   . Malignant hyperthermia Neg Hx   . Pseudochol deficiency Neg Hx   . Cancer Sister   . Colon cancer Brother   . Colon cancer Brother     Social History:  reports that he quit smoking about 17 years ago. His smoking use included Cigarettes. He has a 30 pack-year smoking history. He quit smokeless tobacco use about 21 years ago. His smokeless tobacco use included Chew. He reports that he does not drink alcohol or use illicit drugs.  Allergies: No Known Allergies  Medications:  Scheduled Meds: . amLODipine  10 mg Oral Daily  . aspirin  81 mg Oral Daily  . atorvastatin  80 mg Oral Daily  . calcitRIOL  0.5 mcg Oral Daily  . carvedilol  25 mg Oral BID WC  . docusate sodium  200 mg Oral Daily  . feeding supplement (NEPRO CARB STEADY)  237 mL Oral BID BM  . heparin  5,000 Units Subcutaneous 3 times per day  . hydrALAZINE  25 mg Oral 4 times per day  . latanoprost  1 drop Both Eyes QHS  . lisinopril  10 mg Oral BID  . pantoprazole  40 mg Oral Q1200  . sevelamer carbonate  2,400 mg Oral TID WC   And  . sevelamer carbonate  1,600 mg Oral With snacks   Continuous Infusions: . sodium chloride 10 mL/hr at 11/08/15 0600  . nitroGLYCERIN 10 mcg/min (11/08/15 0600)   PRN Meds:.acetaminophen **OR** acetaminophen, gabapentin, ondansetron **OR** ondansetron (ZOFRAN) IV, oxyCODONE   Results for orders placed or performed during the hospital encounter of 11/07/15 (from the past 48 hour(s))  CBC with Differential     Status: Abnormal   Collection Time: 11/08/15 12:30 AM  Result Value Ref Range   WBC 10.4 4.0 - 10.5 K/uL   RBC 3.92 (L)  4.22 - 5.81 MIL/uL   Hemoglobin 11.6 (L) 13.0 - 17.0 g/dL   HCT 38.5 (L) 39.0 - 52.0 %   MCV 98.2 78.0 - 100.0 fL   MCH 29.6 26.0 - 34.0 pg   MCHC 30.1 30.0 - 36.0 g/dL   RDW 19.9 (H) 11.5 - 15.5 %   Platelets 310 150 - 400 K/uL   Neutrophils Relative % 79 %   Neutro Abs 8.3 (H) 1.7 - 7.7 K/uL   Lymphocytes Relative 12 %   Lymphs Abs 1.3 0.7 - 4.0 K/uL   Monocytes Relative 8 %   Monocytes Absolute 0.8 0.1 - 1.0 K/uL  Eosinophils Relative 1 %   Eosinophils Absolute 0.1 0.0 - 0.7 K/uL   Basophils Relative 0 %   Basophils Absolute 0.0 0.0 - 0.1 K/uL  Comprehensive metabolic panel     Status: Abnormal   Collection Time: 11/08/15 12:30 AM  Result Value Ref Range   Sodium 138 135 - 145 mmol/L   Potassium 4.7 3.5 - 5.1 mmol/L   Chloride 96 (L) 101 - 111 mmol/L   CO2 26 22 - 32 mmol/L   Glucose, Bld 135 (H) 65 - 99 mg/dL   BUN 48 (H) 6 - 20 mg/dL   Creatinine, Ser 7.73 (H) 0.61 - 1.24 mg/dL   Calcium 9.5 8.9 - 10.3 mg/dL   Total Protein 7.4 6.5 - 8.1 g/dL   Albumin 3.3 (L) 3.5 - 5.0 g/dL   AST 21 15 - 41 U/L   ALT 14 (L) 17 - 63 U/L   Alkaline Phosphatase 77 38 - 126 U/L   Total Bilirubin 0.5 0.3 - 1.2 mg/dL   GFR calc non Af Amer 6 (L) >60 mL/min   GFR calc Af Amer 7 (L) >60 mL/min    Comment: (NOTE) The eGFR has been calculated using the CKD EPI equation. This calculation has not been validated in all clinical situations. eGFR's persistently <60 mL/min signify possible Chronic Kidney Disease.    Anion gap 16 (H) 5 - 15  Lipase, blood     Status: None   Collection Time: 11/08/15 12:30 AM  Result Value Ref Range   Lipase 26 11 - 51 U/L  Troponin I     Status: Abnormal   Collection Time: 11/08/15 12:30 AM  Result Value Ref Range   Troponin I 0.18 (H) <0.031 ng/mL    Comment:        PERSISTENTLY INCREASED TROPONIN VALUES IN THE RANGE OF 0.04-0.49 ng/mL CAN BE SEEN IN:       -UNSTABLE ANGINA       -CONGESTIVE HEART FAILURE       -MYOCARDITIS       -CHEST TRAUMA        -ARRYHTHMIAS       -LATE PRESENTING MYOCARDIAL INFARCTION       -COPD   CLINICAL FOLLOW-UP RECOMMENDED.   MRSA PCR Screening     Status: None   Collection Time: 11/08/15  3:10 AM  Result Value Ref Range   MRSA by PCR NEGATIVE NEGATIVE    Comment:        The GeneXpert MRSA Assay (FDA approved for NASAL specimens only), is one component of a comprehensive MRSA colonization surveillance program. It is not intended to diagnose MRSA infection nor to guide or monitor treatment for MRSA infections.   CBC     Status: Abnormal   Collection Time: 11/08/15  3:27 AM  Result Value Ref Range   WBC 10.4 4.0 - 10.5 K/uL   RBC 3.79 (L) 4.22 - 5.81 MIL/uL   Hemoglobin 11.1 (L) 13.0 - 17.0 g/dL   HCT 37.3 (L) 39.0 - 52.0 %   MCV 98.4 78.0 - 100.0 fL   MCH 29.3 26.0 - 34.0 pg   MCHC 29.8 (L) 30.0 - 36.0 g/dL   RDW 19.9 (H) 11.5 - 15.5 %   Platelets 308 150 - 400 K/uL  Comprehensive metabolic panel     Status: Abnormal   Collection Time: 11/08/15  3:27 AM  Result Value Ref Range   Sodium 138 135 - 145 mmol/L   Potassium 5.0 3.5 - 5.1 mmol/L  Chloride 97 (L) 101 - 111 mmol/L   CO2 25 22 - 32 mmol/L   Glucose, Bld 138 (H) 65 - 99 mg/dL   BUN 52 (H) 6 - 20 mg/dL   Creatinine, Ser 7.88 (H) 0.61 - 1.24 mg/dL   Calcium 9.7 8.9 - 10.3 mg/dL   Total Protein 7.4 6.5 - 8.1 g/dL   Albumin 3.3 (L) 3.5 - 5.0 g/dL   AST 22 15 - 41 U/L   ALT 16 (L) 17 - 63 U/L   Alkaline Phosphatase 76 38 - 126 U/L   Total Bilirubin 0.6 0.3 - 1.2 mg/dL   GFR calc non Af Amer 6 (L) >60 mL/min   GFR calc Af Amer 7 (L) >60 mL/min    Comment: (NOTE) The eGFR has been calculated using the CKD EPI equation. This calculation has not been validated in all clinical situations. eGFR's persistently <60 mL/min signify possible Chronic Kidney Disease.    Anion gap 16 (H) 5 - 15  Troponin I     Status: Abnormal   Collection Time: 11/08/15  3:27 AM  Result Value Ref Range   Troponin I 0.20 (H) <0.031 ng/mL    Comment:         PERSISTENTLY INCREASED TROPONIN VALUES IN THE RANGE OF 0.04-0.49 ng/mL CAN BE SEEN IN:       -UNSTABLE ANGINA       -CONGESTIVE HEART FAILURE       -MYOCARDITIS       -CHEST TRAUMA       -ARRYHTHMIAS       -LATE PRESENTING MYOCARDIAL INFARCTION       -COPD   CLINICAL FOLLOW-UP RECOMMENDED.     Dg Abd Acute W/chest  11/08/2015  CLINICAL DATA:  Central chest pain with weakness and vomiting tonight. Bilateral lower extremity amputee. History of cardiomyopathy, hypertension, coronary disease, stroke, hemodialysis, former smoker. EXAM: DG ABDOMEN ACUTE W/ 1V CHEST COMPARISON:  11/04/2015 FINDINGS: Cardiac enlargement with mild pulmonary vascular congestion. Slight interstitial pattern to the lung bases suggest mild edema. Small left pleural effusion with basilar atelectasis. Calcified and tortuous aorta. Scattered gas and stool in the colon. No small or large bowel distention. No free intra-abdominal air. No abnormal air-fluid levels. No radiopaque stones. Degenerative changes in the lumbar spine and hips. Diffuse vascular calcifications. IMPRESSION: Cardiac enlargement with mild pulmonary vascular congestion and mild interstitial edema. Small left pleural effusion with basilar atelectasis. Normal nonobstructive bowel gas pattern. Electronically Signed   By: Lucienne Capers M.D.   On: 11/08/2015 01:43    ROS  See HPI Eyes: Negative Ears:Negative for hearing loss, tinnitus Cardiovascular: positive for chest pain, palpitations,irregular heartbeat, dyspnea, dyspnea on exertion, near-syncope, orthopnea, paroxysmal nocturnal dyspnea and negative syncope,edema, claudication, cyanosis,.  Respiratory:   Negative for cough, hemoptysis, sputum production and wheezing.   Endocrine: Negative for cold intolerance and heat intolerance.  Hematologic/Lymphatic: Negative for adenopathy and bleeding problem. Does not bruise/bleed easily.  Musculoskeletal: Negative.   Gastrointestinal: positive for nausea,  negative vomiting, reflux, abdominal pain, diarrhea, constipation.   Genitourinary: on HD.  Neurological: Negative.  Allergic/Immunologic: Negative for environmental allergies.  Blood pressure 170/88, pulse 102, temperature 98.1 F (36.7 C), temperature source Oral, resp. rate 22, height 4' 10"  (1.473 m), weight 125 lb (56.7 kg), SpO2 94 %. Physical Exam  PHYSICAL EXAM: Elderly, short of breath, uncomfortable. Neck: Increased JVD, HJR, no Bruit, or thyroid enlargement Lungs: Decreased breath sounds with crackles at the bases Cardiovascular: Irreg irreg with 1-8/5 harsh systolic  murmur LSB, positive S4, no bruit, thrill, or heave. Abdomen: BS normal. Soft without organomegaly, masses, lesions or tenderness. Extremities:  Bilateral above knee amputation SKin: Warm, no lesions or rashes  Musculoskeletal: No deformities Neuro: no focal signs     Assessment/Plan:  Hypertensive emergency:on Norvasc 10 mg daily, Coreg 25 mg BID, hydralazine 25 mg q 6 hrs, lisinopril 10 mg BID, NTG paste, and IV NTG  Atrial fibrillation:  EKG Afib. Tele looks like afib at 110/m. This is a new diagnosis for him. CHADSVASC=at least 5. History of diverticular GI bleed 09/2014 followed by [facility].Will discuss with Dr. Harl Bowie. Giving am dose of Coreg now. Dilt not ideal with LVD and CHF.  Chest pain in the setting of hypertensive emergency and fluid overload with positive troponins. No EKG changes.  Chronic combined systolic/diastolic CHF echo 16/1096 EF 30-35% with restrictive diastolic function prior echo 35-40%  CAD cath 07/2014 with diffuse mainly moderate disease with 80% ramus treated medically, admitted 07/2015 with NSTEMI troponins peaked at 1.09. There was evidence of fluid overload and symptoms resolved with dialysis. Recurrent admissions with chest pain in the setting of severely elevated blood pressures and mildly elevated troponins treated medically  Hyperlipidemia on a statin  Aortic stenosis mild  to moderate on echo 07/2015 AVA 1.2, mean gradient 16  PAD followed by vascular status post amputation both right and left lower extremities  End-stage renal disease on hemodialysis  Ermalinda Barrios 11/08/2015, 7:51 AM    Attending Note Patient seen and discussed with [place], I agree with her documentation above. 82 yo male history of chronic combined systolic/diastolic HF with LVEF 04-54%, ESRD, HTN, CAD, hyperlipidemia, mild to moderate aortic stenosis and PAD admitted with chest pain and SOB. He has had recurrent issues with bp spikes at home with associated chest pain.    ER vitals: bp 190/103 HR 103 94% sat - cath 07/2014 with diffuse mainly moderate disease, most significnat disease 80% ramus that was managed medically - trop 0.18-->0.20, Cr 7.73, BUN 48, Hgb 11.6, Plt 310,  CXR cardiomegaly with mild pulm congestion EKG afib, LVH with strain pattern   Patient admitted with hypertensive emergency. He is on norvasc 21m, coreg 25 bid, hydralazine 241mq6 hrs, lisinopril 1047mid, and nitro gtt. Fairly mild troponin elevation in setting of ESRD and severe HTN, now up to 0.20. EKG shows new onset afib and LVH with associated changes that are chronic. Avoid over aggressive correction of bp, would work to decrease MAP 25% within the first 24 hours, based on this AM numbers that would make a goal MAP of 95. Once off nitro gtt we will have to monitor him on his oral regimen. He reports strict medication compliance at home, and even has had a nurse come out recently to help with meds. Regarding new onset afib he is currently rate contolled. History of lower GI bleed in Jan 2016, colonscopy showed diverticulosis with clots and coffee ground material without specific bleeding source noted. Bleeding scan did not show source of bleed. Despite his high CHADS2Vasc score he is also at very high risk for bleeding given prior bleeding episode 1 year ago as well as a HAS-BLED score of 4, at this time I would  not start anticoagulation, continue ASA. We will discuss further with patient this admission and at follow up.    J BZandra Abts

## 2015-11-08 NOTE — Consult Note (Addendum)
Randy [name] MRN: [id number] DOB/AGE: Jan 11, 1932 82 y.o. Primary Care 9 M, MD Admit date: 11/07/2015 Chief Complaint:  Chief Complaint  Patient presents with  . Hypertension   HPI: Pt is 61 year African American male with with past medical of ESRD who presented to Er with c/o Dyspnea.  HPI dates back to 203 days ago when pt came to ER with c/o high blood pressure, at that time decision was made to mage this as outpt but pt came back agin yesterday again with bp on higher side. Pt also c/o shortness of breath. Pt also c/o chest pain. Pt was found to have trops on higher side and pt was admitted  Pt  c/o nausea NO c/o  vomiting NO c/o fever/cough/chills NO c/o hematuria    Past Medical History  Diagnosis Date  . Hypercholesterolemia   . Gout   . Nonischemic cardiomyopathy (HCC)     LVEF 35-40%  . Anemia of chronic disease   . Essential hypertension   . Arthritis   . Peripheral vascular disease ([place])     Status post right below knee amputation for a nonhealing wound of the right foot 12/2014  . History of pneumonia 2014  . CAD (coronary artery disease)     Moderate multivessel disease 07/2015 - managed medically  . History of myopericarditis 2015    07/2015  . Stroke [facility])     "they said I did"  "I did not know anything about it"  . ESRD on hemodialysis [facility])     M/W/F in [place] - Dr. Florene Glen  . Constipation   . History of blood transfusion   . Pneumonia   . Acute myopericarditis 2015        Family History  Problem Relation Age of Onset  . Arthritis    . Cancer    . Kidney disease    . Anesthesia problems Neg Hx   . Hypotension Neg Hx   . Malignant hyperthermia Neg Hx   . Pseudochol deficiency Neg Hx   . Cancer Sister   . Colon cancer Brother   . Colon cancer Brother     Social History:  reports that he quit smoking about 17 years ago. His smoking use included Cigarettes. He has a 30 pack-year smoking history. He quit smokeless  tobacco use about 21 years ago. His smokeless tobacco use included Chew. He reports that he does not drink alcohol or use illicit drugs.   Allergies: No Known Allergies  Medications Prior to Admission  Medication Sig Dispense Refill  . amLODipine (NORVASC) 10 MG tablet Take 0.5 tablets (5 mg total) by mouth daily.    Marland Kitchen aspirin 81 MG tablet Take 81 mg by mouth daily.    Marland Kitchen atorvastatin (LIPITOR) 80 MG tablet Take 1 tablet (80 mg total) by mouth daily. 90 tablet 3  . carvedilol (COREG) 25 MG tablet Take 1 tablet (25 mg total) by mouth 2 (two) times daily with a meal. 60 tablet 1  . docusate sodium (COLACE) 100 MG capsule Take 2 capsules (200 mg total) by mouth daily. 10 capsule 0  . gabapentin (NEURONTIN) 100 MG capsule Take 1 capsule (100 mg total) by mouth at bedtime. (Patient taking differently: Take 100 mg by mouth at bedtime as needed (for neuropathy). )    . isosorbide mononitrate (IMDUR) 30 MG 24 hr tablet Take 0.5 tablets (15 mg total) by mouth at bedtime. 45 tablet 3  . lidocaine-prilocaine (EMLA) cream Apply 1 application topically as needed.  For dialysis  10  . lisinopril (PRINIVIL,ZESTRIL) 10 MG tablet Take 1 tablet (10 mg total) by mouth 2 (two) times daily. 180 tablet 3  . LUMIGAN 0.01 % SOLN Place 1 drop into both eyes at bedtime.  11  . multivitamin (RENA-VIT) TABS tablet Take 1 tablet by mouth daily.    Marland Kitchen oxyCODONE (OXY IR/ROXICODONE) 5 MG immediate release tablet Take 1 tablet (5 mg total) by mouth every 6 (six) hours as needed for moderate pain or severe pain. 50 tablet 0  . pantoprazole (PROTONIX) 40 MG tablet Take 1 tablet (40 mg total) by mouth daily at 12 noon.    . polyvinyl alcohol (LIQUIFILM TEARS) 1.4 % ophthalmic solution Place 1 drop into both eyes as needed for dry eyes. 15 mL 0  . sevelamer carbonate (RENVELA) 800 MG tablet Take 1,600-2,400 mg by mouth See admin instructions. Take 3 tablets (2400 mg) with meals and 2 tablets (1600 mg) with snacks    . SIMBRINZA 1-0.2  % SUSP Place 1 drop into both eyes 2 (two) times daily.  11  . calcitRIOL (ROCALTROL) 0.5 MCG capsule Take 1 capsule (0.5 mcg total) by mouth daily. (Patient not taking: Reported on 11/07/2015) 30 capsule 3  . senna-docusate (SENOKOT-S) 8.6-50 MG tablet Take 2 tablets by mouth at bedtime. Available over the counter--for constipation. (Patient not taking: Reported on 11/07/2015)         [id number] from the symptoms mentioned above,there are no other symptoms referable to all systems reviewed.  Marland Kitchen amLODipine  10 mg Oral Daily  . aspirin  81 mg Oral Daily  . atorvastatin  80 mg Oral Daily  . calcitRIOL  0.5 mcg Oral Daily  . carvedilol  25 mg Oral BID WC  . docusate sodium  200 mg Oral Daily  . feeding supplement (NEPRO CARB STEADY)  237 mL Oral BID BM  . heparin  5,000 Units Subcutaneous 3 times per day  . hydrALAZINE  25 mg Oral 4 times per day  . latanoprost  1 drop Both Eyes QHS  . lisinopril  10 mg Oral BID  . pantoprazole  40 mg Oral Q1200  . sevelamer carbonate  2,400 mg Oral TID WC   And  . sevelamer carbonate  1,600 mg Oral With snacks      Physical Exam: Vital signs in last 24 hours: Temp:  [97 F (36.1 C)-98.1 F (36.7 C)] 97 F (36.1 C) (02/15 0758) Pulse Rate:  [82-118] 102 (02/15 0645) Resp:  [15-28] 22 (02/15 0645) BP: (161-194)/(76-113) 190/102 mmHg (02/15 0848) SpO2:  [93 %-99 %] 94 % (02/15 0645) Weight:  [124 lb 12.5 oz (56.6 kg)-128 lb (58.06 kg)] 125 lb (56.7 kg) (02/15 0328) Weight change:  Last BM Date: 11/06/15  Intake/Output from previous day: 02/14 0701 - 02/15 0700 In: 100.5 [P.O.:60; I.V.:40.5] Out: -      Physical Exam: General- pt is awake,alert, oriented to time place and person Resp- Moderate REsp distress, Decreased BS at bases CVS- S1S2 regular ij rate and rhythm GIT- BS+, soft, NT, ND, PD cath insitu. EXT- Left AKA, Right BKA,NO LE Edema,NO Cyanosis CNS- CN 2-12 grossly intact.  Psych- normal mood and affect Access-  AVF+ bruit +  thrill   Lab Results: CBC  Recent Labs  11/08/15 0030 11/08/15 0327  WBC 10.4 10.4  HGB 11.6* 11.1*  HCT 38.5* 37.3*  PLT 310 308    BMET  Recent Labs  11/08/15 0030 11/08/15 0327  NA 138 138  K  4.7 5.0  CL 96* 97*  CO2 26 25  GLUCOSE 135* 138*  BUN 48* 52*  CREATININE 7.73* 7.88*  CALCIUM 9.5 9.7     Lab Results  Component Value Date   PTH 559.0* 06/24/2012   CALCIUM 9.7 11/08/2015   CAION 1.09* 10/20/2014   PHOS 5.0* 10/30/2015      Impression: 1)Renal  ESRD on HD Pt is on Mon/Wed/Fri  schedule Will dialyze today  2)HTN BP not at goal   Medication On Calcium Channel Blockers On Alpha and beta Blockers  On Vasodilators-   3)Anemia- in ESRD the goal for HGb is 9--11    HGb at goal    Will hold off epo for today  4)CKD Mineral-Bone Disorder PTH acceptable . Secondary Hyperparathyroidism present. Phosphorus at goal.   5)CVS    CAD- admitted with chest pain and trops elevated      CHF- admitted with Fluid overload       Primary MD and Cardiology following  6)Electrolytes  Normokalemic NOrmonatremic   7)Acid base Co2 at goal     Plan:  Will increase hydralazine to  50 mg po q8 hr ( for compliance)  Will change lisinopril to once daily to help with adherence Will dialyze today  Will try to take 2.5 liters off, this should help with HTN as well. Will start clonidine if above measures do not help    Kimbley Sprague S 11/08/2015, 9:04 AM

## 2015-11-08 NOTE — Progress Notes (Signed)
Initial Nutrition Assessment  DOCUMENTATION CODES:   Non-severe (moderate) malnutrition in context of chronic illness  INTERVENTION:  -Continue Nepro BID, 425 kcal, 19.1 gram of protein per bottle - Encourage PO intake  NUTRITION DIAGNOSIS:   Inadequate oral intake related to poor appetite as evidenced by per patient/family report.  GOAL:   Patient will meet greater than or equal to 90% of their needs  MONITOR:   PO intake, Supplement acceptance, Labs  REASON FOR ASSESSMENT:   Malnutrition Screening Tool  ASSESSMENT:   Pt with PMHx of HTN, hypercholesteremia, ESRD on M/W/F hemodialysis, CAD, myopericarditis, and stroke who presents to the Emergency Department complaining of hypertension with onset this morning  Pt seen for MST. Pt reports his appetite has been poor for the past week, he reports when he eats he throws-up. He did state Nepro sits well with him, in order to help meet his nutritional needs will increase his Nepro to TID. PTA reports he ate 2 meals daily.   Pt reports he has lost weight but could not elaborate on amount or timeframe. Calculated pt's adjusted BMI for bilateral BKA, pt has BMI of 21.3, which classifies him within normal weight  Conducted a nutrition focused physical exam, identified mild/moderate muscle and fat wasting.   Medications reviewed. Labs reviewed.   Diet Order:  Diet Heart Room service appropriate?: Yes; Fluid consistency:: Thin  Skin:  Wound (see comment) (Stage II pressure ulcer on right buttocks)  Last BM:  11/06/2015  Height:   Ht Readings from Last 1 Encounters:  11/08/15 4\' 10"  (1.473 m)    Weight:   Wt Readings from Last 1 Encounters:  11/08/15 125 lb (56.7 kg)    Ideal Body Weight:  63.2 kg (- 13% for double BKA, previously 5'9")  BMI:  Adjusted BMI for bilateral amputation 21.3  Estimated Nutritional Needs:   Kcal:  1600-1800   Protein:  60-70  Fluid:  Per MD  EDUCATION NEEDS:   No education needs  identified at this time  [place], Dietetic Intern Pager: [phone]

## 2015-11-08 NOTE — Progress Notes (Signed)
PT concerned about staples in his left leg.  He said he had an appointment to get the staples removed, but he is currently hospitalized.

## 2015-11-08 NOTE — Progress Notes (Signed)
Patient was taken off of labetalol 200 by mouth twice a day as well as clonidine 0.2 by mouth twice a day as his cardiologist added carvedilol 25 mg by mouth twice a day since that time he said to hypertensive urgencies I suspect his hypertensive regimen may be better controlled on his previous regimen I will discuss this with cardiology I have since added clonidine 0.2 by mouth twice a day Randy Randy [name] [id number] DOB: 05-13-1932 DOA: 11/07/2015 PCP: Randy Curet, MD             Physical Exam: Blood pressure 190/102, pulse 102, temperature 97 F (36.1 C), temperature source Oral, resp. rate 22, height 4\' 10"  (1.473 Randy [name]), weight 125 lb (56.7 kg), SpO2 94 %. lungs diminished breath sounds in the bases no rales wheeze rhonchi appreciable heart irregular rhythm no S3 no heaves thrills or rubs or pressure 136/88 on monitor soft nontender bowel sounds normoactive   Investigations:  Recent Results (from the past 240 hour(s))  MRSA PCR Screening     Status: None   Collection Time: 11/08/15  3:10 AM  Result Value Ref Range Status   MRSA by PCR NEGATIVE NEGATIVE Final    Comment:        The GeneXpert MRSA Assay (FDA approved for NASAL specimens only), is one component of a comprehensive MRSA colonization surveillance program. It is not intended to diagnose MRSA infection nor to guide or monitor treatment for MRSA infections.      Basic Metabolic Panel:  Recent Labs  11/08/15 0030 11/08/15 0327  NA 138 138  K 4.7 5.0  CL 96* 97*  CO2 26 25  GLUCOSE 135* 138*  BUN 48* 52*  CREATININE 7.73* 7.88*  CALCIUM 9.5 9.7   Liver Function Tests:  Recent Labs  11/08/15 0030 11/08/15 0327  AST 21 22  ALT 14* 16*  ALKPHOS 77 76  BILITOT 0.5 0.6  PROT 7.4 7.4  ALBUMIN 3.3* 3.3*     CBC:  Recent Labs  11/08/15 0030 11/08/15 0327  WBC 10.4 10.4  NEUTROABS 8.3*  --   HGB 11.6* 11.1*  HCT 38.5* 37.3*  MCV 98.2 98.4  PLT 310 308    Dg Abd Acute  W/chest  11/08/2015  CLINICAL DATA:  Central chest pain with weakness and vomiting tonight. Bilateral lower extremity amputee. History of cardiomyopathy, hypertension, coronary disease, stroke, hemodialysis, former smoker. EXAM: DG ABDOMEN ACUTE W/ 1V CHEST COMPARISON:  11/04/2015 FINDINGS: Cardiac enlargement with mild pulmonary vascular congestion. Slight interstitial pattern to the lung bases suggest mild edema. Small left pleural effusion with basilar atelectasis. Calcified and tortuous aorta. Scattered gas and stool in the colon. No small or large bowel distention. No free intra-abdominal air. No abnormal air-fluid levels. No radiopaque stones. Degenerative changes in the lumbar spine and hips. Diffuse vascular calcifications. IMPRESSION: Cardiac enlargement with mild pulmonary vascular congestion and mild interstitial edema. Small left pleural effusion with basilar atelectasis. Normal nonobstructive bowel gas pattern. Electronically Signed   By: Lucienne Capers Randy [name].D.   On: 11/08/2015 01:43      Medications:   Impression: New-onset atrial flutter with variable  3-1 conduction  Active Problems:   Elevated troponin   S/P BKA (below knee amputation) unilateral (HCC)   Chest pain   Status post above knee amputation of left lower extremity (HCC)   ESRD on dialysis ([place])   Hypertensive urgency, malignant   Malnutrition of moderate degree     Plan: Resume clonidine 0.2 mg by mouth twice  a day dialysis today will discuss the risk benefit ratio of carvedilol to labetalol for hypertension control with cardiology will consider full anticoagulation  Consultants: Cardiology and nephrology   Procedures   Antibiotics:                   Code Status:   Family Communication:  Spoke with wife and patient and family in room  Disposition Plan   Time spent:   LOS: 0 days   Randy Randy [name]   11/08/2015, 12:36 PM

## 2015-11-08 NOTE — Care Management Note (Signed)
Case Management Note  Patient Details  Name: Randy [name] MRN: [id number] Date of Birth: [birth year]  Subjective/Objective:                  Pt iadmitted with HTN urgency. Pt is from home with wife and receives HD MWF from [facility] in [place]. Pt in severe pain and unable to complete assessment. Anticipate pt will return home with wife at DC.   Action/Plan: Will cont assessment and DC planning at another time.   Expected Discharge Date:  11/11/15               Expected Discharge Plan:  Home/Self Care  In-House Referral:  NA  Discharge planning Services  CM Consult  Post Acute Care Choice:  NA Choice offered to:  NA  DME Arranged:    DME Agency:     HH Arranged:    HH Agency:     Status of Service:  In process, will continue to follow  Medicare Important Message Given:    Date Medicare IM Given:    Medicare IM give by:    Date Additional Medicare IM Given:    Additional Medicare Important Message give by:     If discussed at [place] of Stay Meetings, dates discussed:    Additional Comments:  Sherald Barge, RN 11/08/2015, 4:34 PM

## 2015-11-08 NOTE — Care Management Obs Status (Signed)
[facility] NOTIFICATION   Patient Details  Name: Randy [name] MRN: [id number] Date of Birth: 09/23/1932   Medicare Observation Status Notification Given:  Yes    Sherald Barge, RN 11/08/2015, 4:33 PM

## 2015-11-09 DIAGNOSIS — I160 Hypertensive urgency: Secondary | ICD-10-CM | POA: Diagnosis present

## 2015-11-09 DIAGNOSIS — E785 Hyperlipidemia, unspecified: Secondary | ICD-10-CM | POA: Diagnosis present

## 2015-11-09 DIAGNOSIS — D638 Anemia in other chronic diseases classified elsewhere: Secondary | ICD-10-CM | POA: Diagnosis present

## 2015-11-09 DIAGNOSIS — Z7982 Long term (current) use of aspirin: Secondary | ICD-10-CM | POA: Diagnosis not present

## 2015-11-09 DIAGNOSIS — E7800 Pure hypercholesterolemia, unspecified: Secondary | ICD-10-CM | POA: Diagnosis present

## 2015-11-09 DIAGNOSIS — Z87891 Personal history of nicotine dependence: Secondary | ICD-10-CM | POA: Diagnosis not present

## 2015-11-09 DIAGNOSIS — Z800 Family history of malignant neoplasm of digestive organs: Secondary | ICD-10-CM | POA: Diagnosis not present

## 2015-11-09 DIAGNOSIS — R7989 Other specified abnormal findings of blood chemistry: Secondary | ICD-10-CM | POA: Diagnosis not present

## 2015-11-09 DIAGNOSIS — Z8673 Personal history of transient ischemic attack (TIA), and cerebral infarction without residual deficits: Secondary | ICD-10-CM | POA: Diagnosis not present

## 2015-11-09 DIAGNOSIS — I480 Paroxysmal atrial fibrillation: Secondary | ICD-10-CM | POA: Diagnosis present

## 2015-11-09 DIAGNOSIS — Z89612 Acquired absence of left leg above knee: Secondary | ICD-10-CM | POA: Diagnosis not present

## 2015-11-09 DIAGNOSIS — I739 Peripheral vascular disease, unspecified: Secondary | ICD-10-CM | POA: Diagnosis present

## 2015-11-09 DIAGNOSIS — I5042 Chronic combined systolic (congestive) and diastolic (congestive) heart failure: Secondary | ICD-10-CM | POA: Diagnosis present

## 2015-11-09 DIAGNOSIS — E440 Moderate protein-calorie malnutrition: Secondary | ICD-10-CM | POA: Diagnosis present

## 2015-11-09 DIAGNOSIS — Z8701 Personal history of pneumonia (recurrent): Secondary | ICD-10-CM | POA: Diagnosis not present

## 2015-11-09 DIAGNOSIS — I252 Old myocardial infarction: Secondary | ICD-10-CM | POA: Diagnosis not present

## 2015-11-09 DIAGNOSIS — I132 Hypertensive heart and chronic kidney disease with heart failure and with stage 5 chronic kidney disease, or end stage renal disease: Secondary | ICD-10-CM | POA: Diagnosis present

## 2015-11-09 DIAGNOSIS — Z89511 Acquired absence of right leg below knee: Secondary | ICD-10-CM | POA: Diagnosis not present

## 2015-11-09 DIAGNOSIS — I429 Cardiomyopathy, unspecified: Secondary | ICD-10-CM | POA: Diagnosis present

## 2015-11-09 DIAGNOSIS — N186 End stage renal disease: Secondary | ICD-10-CM | POA: Diagnosis present

## 2015-11-09 DIAGNOSIS — R0789 Other chest pain: Secondary | ICD-10-CM | POA: Diagnosis present

## 2015-11-09 DIAGNOSIS — I2510 Atherosclerotic heart disease of native coronary artery without angina pectoris: Secondary | ICD-10-CM | POA: Diagnosis present

## 2015-11-09 DIAGNOSIS — I350 Nonrheumatic aortic (valve) stenosis: Secondary | ICD-10-CM | POA: Diagnosis present

## 2015-11-09 DIAGNOSIS — Z992 Dependence on renal dialysis: Secondary | ICD-10-CM | POA: Diagnosis not present

## 2015-11-09 MED ORDER — LISINOPRIL 10 MG PO TABS
20.0000 mg | ORAL_TABLET | Freq: Two times a day (BID) | ORAL | Status: DC
  Administered 2015-11-09: 20 mg via ORAL
  Filled 2015-11-09: qty 2

## 2015-11-09 NOTE — Procedures (Signed)
  HEMODIALYSIS TREATMENT NOTE:  4 hour heparin-free dialysis completed via left forearm AVF (16g ante/retrograde). Goal met: 2.6 liters removed without interruption in ultrafiltration.  Extracorporeal circuit began clotting 2.5 hours into treatment, despite NS flushes q 30 minutes to maintain patency. No blood loss; all blood was returned then and at end of treatment. Hemostasis was achieved within 15 minutes. Report given to Gershon Cull, RN.  Rockwell Alexandria, RN, CDN

## 2015-11-09 NOTE — Progress Notes (Signed)
Patient was given coreg and clonidine this morning as ordered. Nitro drip was running at 3 ml an hour. Patient BP showed 99991111 systolic. Repeat showed a systolic of 99991111. Dr. Harl Bowie on floor and notified. Will leave Nitro drip off and hold Norvasc and lisinopril until patients blood pressure is a little higher.

## 2015-11-09 NOTE — Progress Notes (Signed)
[facility]  Patient Status: Active (receiving services up to time of hospitalization)  [facility] is providing the following services: RN, PT, OT  If patient discharges after hours, please call [phone].   Randy [name] 11/09/2015, 9:27 AM

## 2015-11-09 NOTE — Progress Notes (Signed)
Patient with end-stage renal disease on dialysis with recurrent accelerated hypertension episodes new-onset A. fib status post GI bleed I risk for anticoagulation who today's quite normotensive blood pressure 118/68 for a controlled ventricular response between 65 and 85 bpm. Appears hemodynamically stable Randy [name] [id number] DOB: October 02, 1931 DOA: 11/07/2015 PCP: Randy Curet, MD             Physical Exam: Blood pressure 113/69, pulse 86, temperature 98.5 F (36.9 C), temperature source Oral, resp. rate 18, height 4\' 10"  (1.473 [name]), weight 125 lb 7.1 oz (56.9 kg), SpO2 94 %. alert and oriented lungs show diminished breath sounds in the bases scattered rhonchi no rales no wheezes audible heart regular rhythm 2/6 apical murmur systolic no heaves thrills rubs abdomen soft nontender bowel sounds normoactive   Investigations:  Recent Results (from the past 240 hour(s))  MRSA PCR Screening     Status: None   Collection Time: 11/08/15  3:10 AM  Result Value Ref Range Status   MRSA by PCR NEGATIVE NEGATIVE Final    Comment:        The GeneXpert MRSA Assay (FDA approved for NASAL specimens only), is one component of a comprehensive MRSA colonization surveillance program. It is not intended to diagnose MRSA infection nor to guide or monitor treatment for MRSA infections.      Basic Metabolic Panel:  Recent Labs  11/08/15 0030 11/08/15 0327  NA 138 138  K 4.7 5.0  CL 96* 97*  CO2 26 25  GLUCOSE 135* 138*  BUN 48* 52*  CREATININE 7.73* 7.88*  CALCIUM 9.5 9.7   Liver Function Tests:  Recent Labs  11/08/15 0030 11/08/15 0327  AST 21 22  ALT 14* 16*  ALKPHOS 77 76  BILITOT 0.5 0.6  PROT 7.4 7.4  ALBUMIN 3.3* 3.3*     CBC:  Recent Labs  11/08/15 0030 11/08/15 0327  WBC 10.4 10.4  NEUTROABS 8.3*  --   HGB 11.6* 11.1*  HCT 38.5* 37.3*  MCV 98.2 98.4  PLT 310 308    Dg Abd Acute W/chest  11/08/2015  CLINICAL DATA:  Central chest pain with  weakness and vomiting tonight. Bilateral lower extremity amputee. History of cardiomyopathy, hypertension, coronary disease, stroke, hemodialysis, former smoker. EXAM: DG ABDOMEN ACUTE W/ 1V CHEST COMPARISON:  11/04/2015 FINDINGS: Cardiac enlargement with mild pulmonary vascular congestion. Slight interstitial pattern to the lung bases suggest mild edema. Small left pleural effusion with basilar atelectasis. Calcified and tortuous aorta. Scattered gas and stool in the colon. No small or large bowel distention. No free intra-abdominal air. No abnormal air-fluid levels. No radiopaque stones. Degenerative changes in the lumbar spine and hips. Diffuse vascular calcifications. IMPRESSION: Cardiac enlargement with mild pulmonary vascular congestion and mild interstitial edema. Small left pleural effusion with basilar atelectasis. Normal nonobstructive bowel gas pattern. Electronically Signed   By: Lucienne Capers [name].D.   On: 11/08/2015 01:43      Medications:   Impression:  Active Problems:   Elevated troponin   S/P BKA (below knee amputation) unilateral (HCC)   Chest pain   Status post above knee amputation of left lower extremity (HCC)   ESRD on dialysis ([place])   Hypertensive urgency, malignant   Malnutrition of moderate degree   Hypertensive urgency     Plan: Lisinopril 20 by mouth twice a day, hydralazine 50 by mouth every 8 hours Norvasc 10 mg by mouth daily Coreg 25 mg by mouth twice a day full dose aspirin dialysis in a.[name].  Consultants: Cardiology and nephrology   Procedures   Antibiotics:                   Code Status:  Family Communication:  Spoke with patient and wife  Disposition Plan see plan above  Time spent: 30 minutes   LOS: 1 day   Randy [name]   11/09/2015, 12:32 PM

## 2015-11-09 NOTE — Progress Notes (Signed)
Subjective: Patient is feeling better. Presently he denies any chest pain. His appetite is good   Objective: Vital signs in last 24 hours: Temp:  [97 F (36.1 C)-98.1 F (36.7 C)] 98.1 F (36.7 C) (02/16 0400) Pulse Rate:  [51-101] 101 (02/16 0400) Resp:  [16-32] 20 (02/16 0400) BP: (103-192)/(59-142) 148/88 mmHg (02/16 0523) SpO2:  [96 %-100 %] 99 % (02/16 0400) FiO2 (%):  [28 %] 28 % (02/16 0300) Weight:  [125 lb 7.1 oz (56.9 kg)-125 lb 14.1 oz (57.1 kg)] 125 lb 7.1 oz (56.9 kg) (02/16 0500)  Intake/Output from previous day: 02/15 0701 - 02/16 0700 In: 148.4 [I.V.:148.4] Out: 2600  Intake/Output this shift:     Recent Labs  11/08/15 0030 11/08/15 0327  HGB 11.6* 11.1*    Recent Labs  11/08/15 0030 11/08/15 0327  WBC 10.4 10.4  RBC 3.92* 3.79*  HCT 38.5* 37.3*  PLT 310 308    Recent Labs  11/08/15 0030 11/08/15 0327  NA 138 138  K 4.7 5.0  CL 96* 97*  CO2 26 25  BUN 48* 52*  CREATININE 7.73* 7.88*  GLUCOSE 135* 138*  CALCIUM 9.5 9.7   No results for input(s): LABPT, INR in the last 72 hours.   generally patient is alert in no apparent distress Chest is clear to auscultation Heart exam revealed regular rate and rhythm no murmur Extremities no edema   Assessment/Plan: Problem #1 accelerated hypertension: Presently patient is on lisinopril 20 mg once a day hydralazine 50 mg 3 times a day clonidine 0.2 mg amlodipine 10 mg by mouth daily and nitro drip his blood pressure seems to be better controlled. Problem #2 end-stage renal disease: He is status post hemodialysis yesterday. Presently patient is asymptomatic and his potassium is normal. Problem #3 anemia: His hemoglobin is within our target goal. Problem #4 metabolic bone disease: His calcium is range. Presently he is on a binder. His phosphorus is not available. Problem #5 fluid management: The patient doesn't have any sign of fluid overload Problem #6 history of CVA Plan: 1]We will continue his  present management 2] we'll make arrangements for patient to get dialysis tomorrow 3] will check his basic metabolic panel, phosphorus and CBC in the morning.   Elonzo Sopp S 11/09/2015, 7:54 AM

## 2015-11-09 NOTE — Progress Notes (Addendum)
Subjective:    No complaints  Objective:   Temp:  [97.4 F (36.3 C)-98.5 F (36.9 C)] 98.5 F (36.9 C) (02/16 0846) Pulse Rate:  [49-107] 92 (02/16 0945) Resp:  [16-32] 17 (02/16 0945) BP: (103-164)/(58-142) 113/67 mmHg (02/16 0945) SpO2:  [89 %-100 %] 95 % (02/16 0945) FiO2 (%):  [28 %] 28 % (02/16 0300) Weight:  [125 lb 7.1 oz (56.9 kg)-125 lb 14.1 oz (57.1 kg)] 125 lb 7.1 oz (56.9 kg) (02/16 0500) Last BM Date: 11/07/15  Filed Weights   11/08/15 0328 11/08/15 2020 11/09/15 0500  Weight: 125 lb (56.7 kg) 125 lb 14.1 oz (57.1 kg) 125 lb 7.1 oz (56.9 kg)    Intake/Output Summary (Last 24 hours) at 11/09/15 1012 Last data filed at 11/09/15 0847  Gross per 24 hour  Intake 268.38 ml  Output   2600 ml  Net -2331.62 ml    Telemetry: SR and afib  Exam:  General: NAD  HEENT: sclera clear, throat clear  Resp: CTAB  Cardiac: RRR, 3/6 systolic murmur at apex, no jvd  [id number] soft, NT, ND  MSK:no LE edema  Neuro: No focal deficits  Psych: appropriate affect  Lab Results:  Basic Metabolic Panel:  Recent Labs Lab 11/04/15 1114 11/08/15 0030 11/08/15 0327  NA 140 138 138  K 4.3 4.7 5.0  CL 97* 96* 97*  CO2 30 26 25   GLUCOSE 155* 135* 138*  BUN 47* 48* 52*  CREATININE 5.98* 7.73* 7.88*  CALCIUM 9.6 9.5 9.7    Liver Function Tests:  Recent Labs Lab 11/08/15 0030 11/08/15 0327  AST 21 22  ALT 14* 16*  ALKPHOS 77 76  BILITOT 0.5 0.6  PROT 7.4 7.4  ALBUMIN 3.3* 3.3*    CBC:  Recent Labs Lab 11/04/15 1114 11/08/15 0030 11/08/15 0327  WBC 7.7 10.4 10.4  HGB 11.2* 11.6* 11.1*  HCT 39.1 38.5* 37.3*  MCV 101.8* 98.2 98.4  PLT 291 310 308    Cardiac Enzymes:  Recent Labs Lab 11/08/15 0327 11/08/15 0859 11/08/15 1526  TROPONINI 0.20* 0.25* 0.33*    BNP: No results for input(s): PROBNP in the last 8760 hours.  Coagulation: No results for input(s): INR in the last 168 hours.  ECG:   Medications:   Scheduled  Medications: . amLODipine  10 mg Oral Daily  . aspirin  81 mg Oral Daily  . atorvastatin  80 mg Oral Daily  . calcitRIOL  0.5 mcg Oral Daily  . carvedilol  25 mg Oral BID WC  . cloNIDine  0.2 mg Oral BID  . docusate sodium  200 mg Oral Daily  . feeding supplement (NEPRO CARB STEADY)  237 mL Oral BID BM  . heparin  5,000 Units Subcutaneous 3 times per day  . hydrALAZINE  50 mg Oral 3 times per day  . latanoprost  1 drop Both Eyes QHS  . lisinopril  20 mg Oral Daily  . pantoprazole  40 mg Oral Q1200  . sevelamer carbonate  2,400 mg Oral TID WC   And  . sevelamer carbonate  1,600 mg Oral With snacks     Infusions: . sodium chloride 10 mL/hr at 11/08/15 0600  . nitroGLYCERIN 10 mcg/min (11/09/15 0400)     PRN Medications:  sodium chloride, sodium chloride, acetaminophen **OR** acetaminophen, gabapentin, lidocaine (PF), lidocaine-prilocaine, ondansetron **OR** ondansetron (ZOFRAN) IV, oxyCODONE, pentafluoroprop-tetrafluoroeth     Assessment/Plan    1. Hypertensive emergency - admitted with severe HTN and chest pain.  -  Fairly mild troponin elevation in setting of ESRD and severe HTN, now up to 0.20. EKG shows new onset afib and LVH with associated changes that are chronic. Cath 07/2014 with diffuse mainly moderate disease, had 80% ramus that was medically managed.  - initially on nitro drip, He reports strict medication compliance at home, and even has had a nurse come out recently to help with meds.  - bp better controlled, mildly elevated 140-160s in early AM but no extreme hypertension like on admission.   - appears he was started on clonidine yesterday. I would recommend titrating up his hydralazine or lisinopril before adding new agent. Will d/c clonidine, increase lisionpril to 20mg  bid (bid dosing because severe HTN tends to occur in AM). Nitro dripp weaned to off this AM, follow bp's today.  - severe HTN likely related to age and ESRD, I think an extensive secondary HTN  workup would be of low yield.   2. Afib - he is currently rate contolled.  History of lower GI bleed in Jan 2016, colonscopy showed diverticulosis with clots and coffee ground material without specific bleeding source noted. Bleeding scan did not show source of bleed. Despite his high CHADS2Vasc score he is also at very high risk for bleeding given prior bleeding episode 1 year ago as well as a HAS-BLED score of 4, at this time I would not start anticoagulation, continue ASA     Carlyle Dolly, MD

## 2015-11-10 DIAGNOSIS — I160 Hypertensive urgency: Principal | ICD-10-CM

## 2015-11-10 LAB — CBC
HEMATOCRIT: 34.1 % — AB (ref 39.0–52.0)
HEMOGLOBIN: 10.6 g/dL — AB (ref 13.0–17.0)
MCH: 30 pg (ref 26.0–34.0)
MCHC: 31.1 g/dL (ref 30.0–36.0)
MCV: 96.6 fL (ref 78.0–100.0)
Platelets: 218 10*3/uL (ref 150–400)
RBC: 3.53 MIL/uL — ABNORMAL LOW (ref 4.22–5.81)
RDW: 19.6 % — ABNORMAL HIGH (ref 11.5–15.5)
WBC: 6.4 10*3/uL (ref 4.0–10.5)

## 2015-11-10 LAB — BASIC METABOLIC PANEL
ANION GAP: 14 (ref 5–15)
BUN: 54 mg/dL — ABNORMAL HIGH (ref 6–20)
CO2: 26 mmol/L (ref 22–32)
Calcium: 8.9 mg/dL (ref 8.9–10.3)
Chloride: 94 mmol/L — ABNORMAL LOW (ref 101–111)
Creatinine, Ser: 7.57 mg/dL — ABNORMAL HIGH (ref 0.61–1.24)
GFR calc Af Amer: 7 mL/min — ABNORMAL LOW (ref >60)
GFR calc non Af Amer: 6 mL/min — ABNORMAL LOW (ref >60)
GLUCOSE: 93 mg/dL (ref 65–99)
POTASSIUM: 4.4 mmol/L (ref 3.5–5.1)
Sodium: 134 mmol/L — ABNORMAL LOW (ref 135–145)

## 2015-11-10 LAB — HEPATITIS B SURFACE ANTIGEN: Hepatitis B Surface Ag: NEGATIVE

## 2015-11-10 LAB — PHOSPHORUS: Phosphorus: 6.1 mg/dL — ABNORMAL HIGH (ref 2.5–4.6)

## 2015-11-10 MED ORDER — CARVEDILOL 12.5 MG PO TABS: 12.5000 mg | ORAL_TABLET | Freq: Two times a day (BID) | ORAL | Status: DC

## 2015-11-10 MED ORDER — PENTAFLUOROPROP-TETRAFLUOROETH EX AERO
INHALATION_SPRAY | CUTANEOUS | Status: AC
  Filled 2015-11-10: qty 103.5

## 2015-11-10 MED ORDER — CALCITRIOL 0.5 MCG PO CAPS: 0.5000 ug | ORAL_CAPSULE | Freq: Every day | ORAL | Status: DC

## 2015-11-10 MED ORDER — LISINOPRIL 20 MG PO TABS: 20.0000 mg | ORAL_TABLET | Freq: Two times a day (BID) | ORAL | Status: DC

## 2015-11-10 MED ORDER — HYDRALAZINE HCL 50 MG PO TABS: 50.0000 mg | ORAL_TABLET | Freq: Three times a day (TID) | ORAL | Status: DC

## 2015-11-10 MED ORDER — SODIUM CHLORIDE 0.9 % IV SOLN: 100.0000 mL | INTRAVENOUS | Status: DC | PRN

## 2015-11-10 MED ORDER — ISOSORBIDE MONONITRATE ER 30 MG PO TB24: 15.0000 mg | ORAL_TABLET | Freq: Every day | ORAL | Status: DC

## 2015-11-10 NOTE — Progress Notes (Signed)
Patient D/C'd home per [place].  Pt has f/u this Monday in Dr. Denita Lung office.  All medications and f/u appts were reviewed with patient and family who are currently at the bedside.  Patient will be transported home by family via private vehicle.  All questions and concerns addressed.  D/C'd in stable condition.

## 2015-11-10 NOTE — Procedures (Addendum)
   HEMODIALYSIS NURSING NOTE:  Permission obtained from Dr. Cindie Laroche to dialyze patient in HD unit, off telemetry.  Rockwell Alexandria, RN, CDN   HD TREATMENT NOTE 1340:  4 hour heparin-free dialysis completed via left forearm AVF (16g ante/retrograde). Goal met: 2.5 liters removed without interruption in ultrafiltration. All blood was returned. Hemodynamically stable throughout session with SBP 130-150. Report called to Claybon Jabs, RN.  Rockwell Alexandria, RN, CDN

## 2015-11-10 NOTE — Progress Notes (Signed)
Subjective: Patient presently offers no complaints. He denies any difficulty breathing. His appetite is good  Objective: Vital signs in last 24 hours: Temp:  [97 F (36.1 C)-98.5 F (36.9 C)] 97.6 F (36.4 C) (02/17 0700) Pulse Rate:  [44-103] 74 (02/17 0500) Resp:  [0-28] 12 (02/17 0500) BP: (86-152)/(46-88) 147/68 mmHg (02/17 0500) SpO2:  [92 %-99 %] 95 % (02/17 0500) Weight:  [129 lb 6.6 oz (58.7 kg)] 129 lb 6.6 oz (58.7 kg) (02/17 0500)  Intake/Output from previous day: 02/16 0701 - 02/17 0700 In: 120 [P.O.:120] Out: -  Intake/Output this shift:     Recent Labs  11/08/15 0030 11/08/15 0327 11/10/15 0422  HGB 11.6* 11.1* 10.6*    Recent Labs  11/08/15 0327 11/10/15 0422  WBC 10.4 6.4  RBC 3.79* 3.53*  HCT 37.3* 34.1*  PLT 308 218    Recent Labs  11/08/15 0327 11/10/15 0422  NA 138 134*  K 5.0 4.4  CL 97* 94*  CO2 25 26  BUN 52* 54*  CREATININE 7.88* 7.57*  GLUCOSE 138* 93  CALCIUM 9.7 8.9   No results for input(s): LABPT, INR in the last 72 hours.   generally patient is alert in no apparent distress Chest is clear to auscultation Heart exam revealed regular rate and rhythm no murmur Extremities no edema   Assessment/Plan: Problem #1 accelerated hypertension: His blood pressure is reasonably controlled Problem #2 end-stage renal disease: He is status post hemodialysis on Wednesday. He si asymptotmaitc  Problem #3 anemia: His hemoglobin is within our target goal. Problem #4 metabolic bone disease: His calcium is range but his phosphorus is high and he is on binder. Problem #5 fluid management: Denies any difficulty in breathing Problem #6 history of CVA Problem#7 CAD Plan: 1]We will continue his present management 2] we'll make arrangements for patient to get dialysis today and remove 21/2  liters if sbp >90   Randy [name] S 11/10/2015, 8:33 AM

## 2015-11-10 NOTE — Clinical Documentation Improvement (Signed)
Internal Medicine  Can the diagnosis of Atrial Fibrillation be further specified? Please document response in next progress note NOT in BPA drop down box. Thanks.   Chronic Atrial fibrillation  Paroxysmal Atrial fibrillation  Permanent Atrial fibrillation  Persistent Atrial fibrillation  Other  Clinically Undetermined  Document any associated diagnoses/conditions.  Supporting Information: Started on PO CoregCan the diagnosis of Atrial Fibrillation be further specified?  Please exercise your independent, professional judgment when responding. A specific answer is not anticipated or expected.  Thank You,  Zoila Shutter RN, BSN, [place] [phone]; Cell: [phone]  Please exercise your independent, professional judgment when responding. A specific answer is not anticipated or expected.  Thank You,  Zoila Shutter RN, BSN, [place] [phone]; Cell: [phone]

## 2015-11-10 NOTE — Discharge Summary (Signed)
Physician Discharge Summary  Randy [name] [id number] DOB: April 23, 1932 DOA: 11/07/2015  PCP: [name], MD  Admit date: 11/07/2015 Discharge date: 11/10/2015   Recommendations for Outpatient Follow-up:  Patient will follow-up my office in 2 days' time on Monday morning after his dialysis today Friday to be certain he is hemodynamically stable and to ascertain which cardiac arrhythmias and is currently in A. fib with very moderate controlled ventricular response and is quite normotensive he has increased his antihypertensive medicines to lisinopril 20 by mouth twice a day Norvasc 10 mg by mouth daily hydralazine 50 mg by mouth 3 times a day and carvedilol 25 mg by mouth twice a day as well as aspirin 81 mg per day and Imdur 30 mg by mouth daily. the patient was too high risk due to pre-or prior GI bleed consider anticoagulation at this point Discharge Diagnoses:  Active Problems:   Elevated troponin   S/P BKA (below knee amputation) unilateral (HCC)   Chest pain   Status post above knee amputation of left lower extremity (HCC)   ESRD on dialysis [facility])   Hypertensive urgency, malignant   Malnutrition of moderate degree   Hypertensive urgency   Discharge Condition: Stable and improving  Filed Weights   11/09/15 0500 11/10/15 0500 11/10/15 0920  Weight: 125 lb 7.1 oz (56.9 kg) 129 lb 6.6 oz (58.7 kg) 129 lb 6.6 oz (58.7 kg)    History of present illness:  He was admitted with dyspnea and headache episode which is recurrent now of accelerated hypertension patient states he was compliant with all of his medicines blood pressure was 99991111 systolic upon admission was given IV nitroglycerin and this was controlled with the addition of various oral and intravenous medicines brought under control with dialysis urgently he had 3 episodes of dialysis while in hospital became normotensive P did change him to atrial fibrillation with a very moderate ventricular response about 70 bpm and  he was seen in consultation by cardiology who felt due to prior GI bleed he was too high risk to consider full anticoagulation he is on aspirin 81 mg per day the patient has known coronary artery disease with an ejection fraction of 30-35% with an ischemic cardiopathy and inferolateral akinesis  Hospital Course:  See history of present illness above  Procedures:  3 episodes of dialysis   Consultations:  Cardiology and nephrology  Discharge Instructions  Discharge Instructions    Discharge instructions    Complete by:  As directed      Discharge instructions    Complete by:  As directed             Medication List    TAKE these medications        amLODipine 10 MG tablet  Commonly known as:  NORVASC  Take 0.5 tablets (5 mg total) by mouth daily.     aspirin 81 MG tablet  Take 81 mg by mouth daily.     atorvastatin 80 MG tablet  Commonly known as:  LIPITOR  Take 1 tablet (80 mg total) by mouth daily.     calcitRIOL 0.5 MCG capsule  Commonly known as:  ROCALTROL  Take 1 capsule (0.5 mcg total) by mouth daily.     carvedilol 25 MG tablet  Commonly known as:  COREG  Take 1 tablet (25 mg total) by mouth 2 (two) times daily with a meal.     docusate sodium 100 MG capsule  Commonly known as:  COLACE  Take  2 capsules (200 mg total) by mouth daily.     gabapentin 100 MG capsule  Commonly known as:  NEURONTIN  Take 1 capsule (100 mg total) by mouth at bedtime.     hydrALAZINE 50 MG tablet  Commonly known as:  APRESOLINE  Take 1 tablet (50 mg total) by mouth every 8 (eight) hours.     isosorbide mononitrate 30 MG 24 hr tablet  Commonly known as:  IMDUR  Take 0.5 tablets (15 mg total) by mouth at bedtime.     lidocaine-prilocaine cream  Commonly known as:  EMLA  Apply 1 application topically as needed. For dialysis     lisinopril 20 MG tablet  Commonly known as:  PRINIVIL,ZESTRIL  Take 1 tablet (20 mg total) by mouth 2 (two) times daily.     LUMIGAN 0.01 %  Soln  Generic drug:  bimatoprost  Place 1 drop into both eyes at bedtime.     multivitamin Tabs tablet  Take 1 tablet by mouth daily.     oxyCODONE 5 MG immediate release tablet  Commonly known as:  Oxy IR/ROXICODONE  Take 1 tablet (5 mg total) by mouth every 6 (six) hours as needed for moderate pain or severe pain.     pantoprazole 40 MG tablet  Commonly known as:  PROTONIX  Take 1 tablet (40 mg total) by mouth daily at 12 noon.     polyvinyl alcohol 1.4 % ophthalmic solution  Commonly known as:  LIQUIFILM TEARS  Place 1 drop into both eyes as needed for dry eyes.     sevelamer carbonate 800 MG tablet  Commonly known as:  RENVELA  Take 1,600-2,400 mg by mouth See admin instructions. Take 3 tablets (2400 mg) with meals and 2 tablets (1600 mg) with snacks     SIMBRINZA 1-0.2 % Susp  Generic drug:  Brinzolamide-Brimonidine  Place 1 drop into both eyes 2 (two) times daily.       No Known Allergies    The results of significant diagnostics from this hospitalization (including imaging, microbiology, ancillary and laboratory) are listed below for reference.    Significant Diagnostic Studies: Dg Chest 2 View  11/04/2015  CLINICAL DATA:  Chest pain and hypertension EXAM: CHEST  2 VIEW COMPARISON:  October 21, 2015 and August 04, 2015 FINDINGS: There is chronic appearing volume loss in the left lower lobe region. Lungs elsewhere clear. Heart is mildly enlarged with pulmonary vascularity within normal limits. No adenopathy. There is atherosclerotic calcification throughout the aorta. No bone lesions. IMPRESSION: Chronic volume loss left lower lobe. Superimposed pneumonia in the left base cannot be excluded radiographically. Lungs elsewhere clear. Stable cardiac prominence. Extensive calcification throughout the aorta. Electronically Signed   By: Lowella Grip III M.D.   On: 11/04/2015 11:56   Dg Wrist Complete Right  10/20/2015  CLINICAL DATA:  Acute right wrist pain without known  injury. EXAM: RIGHT WRIST - COMPLETE 3+ VIEW COMPARISON:  July 10, 2012. FINDINGS: There is no evidence of fracture or dislocation. Vascular calcifications are noted. Narrowing and sclerosis of the first carpometacarpal joint as well as several intercarpal joints is noted. IMPRESSION: Findings consistent with osteoarthritis involving the first carpometacarpal and several intercarpal joints. No acute abnormality seen in the right wrist. Electronically Signed   By: Marijo Conception, M.D.   On: 10/20/2015 09:39   Dg Chest Port 1 View  10/21/2015  CLINICAL DATA:  Dyspnea and mid chest pain EXAM: PORTABLE CHEST 1 VIEW COMPARISON:  08/04/2015; 03/13/2015; 10/20/2014;  09/22/2014; 03/28/2014; 10/08/2012 FINDINGS: Grossly unchanged enlarged cardiac silhouette and mediastinal contours with atherosclerotic plaque within the thoracic aorta. Grossly unchanged small left-sided effusion with associated left basilar/retrocardiac heterogeneous/consolidative opacities. No new focal airspace opacities. Chronic pulmonary venous congestion without frank evidence of edema. No pneumothorax. Unchanged bones. IMPRESSION: 1. Stable examination without definite superimposed acute cardiopulmonary disease 2. Similar findings of cardiomegaly, pulmonary venous congestion, chronic left-sided effusion and associated left basilar opacities, likely atelectasis or scar. Electronically Signed   By: Sandi Mariscal M.D.   On: 10/21/2015 09:56   Dg Abd Acute W/chest  11/08/2015  CLINICAL DATA:  Central chest pain with weakness and vomiting tonight. Bilateral lower extremity amputee. History of cardiomyopathy, hypertension, coronary disease, stroke, hemodialysis, former smoker. EXAM: DG ABDOMEN ACUTE W/ 1V CHEST COMPARISON:  11/04/2015 FINDINGS: Cardiac enlargement with mild pulmonary vascular congestion. Slight interstitial pattern to the lung bases suggest mild edema. Small left pleural effusion with basilar atelectasis. Calcified and tortuous  aorta. Scattered gas and stool in the colon. No small or large bowel distention. No free intra-abdominal air. No abnormal air-fluid levels. No radiopaque stones. Degenerative changes in the lumbar spine and hips. Diffuse vascular calcifications. IMPRESSION: Cardiac enlargement with mild pulmonary vascular congestion and mild interstitial edema. Small left pleural effusion with basilar atelectasis. Normal nonobstructive bowel gas pattern. Electronically Signed   By: Lucienne Capers M.D.   On: 11/08/2015 01:43    Microbiology: Recent Results (from the past 240 hour(s))  MRSA PCR Screening     Status: None   Collection Time: 11/08/15  3:10 AM  Result Value Ref Range Status   MRSA by PCR NEGATIVE NEGATIVE Final    Comment:        The GeneXpert MRSA Assay (FDA approved for NASAL specimens only), is one component of a comprehensive MRSA colonization surveillance program. It is not intended to diagnose MRSA infection nor to guide or monitor treatment for MRSA infections.      Labs: Basic Metabolic Panel:  Recent Labs Lab 11/04/15 1114 11/08/15 0030 11/08/15 0327 11/10/15 0422  NA 140 138 138 134*  K 4.3 4.7 5.0 4.4  CL 97* 96* 97* 94*  CO2 30 26 25 26   GLUCOSE 155* 135* 138* 93  BUN 47* 48* 52* 54*  CREATININE 5.98* 7.73* 7.88* 7.57*  CALCIUM 9.6 9.5 9.7 8.9  PHOS  --   --   --  6.1*   Liver Function Tests:  Recent Labs Lab 11/08/15 0030 11/08/15 0327  AST 21 22  ALT 14* 16*  ALKPHOS 77 76  BILITOT 0.5 0.6  PROT 7.4 7.4  ALBUMIN 3.3* 3.3*    Recent Labs Lab 11/08/15 0030  LIPASE 26   No results for input(s): AMMONIA in the last 168 hours. CBC:  Recent Labs Lab 11/04/15 1114 11/08/15 0030 11/08/15 0327 11/10/15 0422  WBC 7.7 10.4 10.4 6.4  NEUTROABS 6.3 8.3*  --   --   HGB 11.2* 11.6* 11.1* 10.6*  HCT 39.1 38.5* 37.3* 34.1*  MCV 101.8* 98.2 98.4 96.6  PLT 291 310 308 218   Cardiac Enzymes:  Recent Labs Lab 11/04/15 1114 11/08/15 0030  11/08/15 0327 11/08/15 0859 11/08/15 1526  TROPONINI 0.10* 0.18* 0.20* 0.25* 0.33*   BNP: BNP (last 3 results)  Recent Labs  01/08/15 1118 08/04/15 0625 10/21/15 0936  BNP >4500.0* 2452.0* 3090.9*    ProBNP (last 3 results) No results for input(s): PROBNP in the last 8760 hours.  CBG: No results for input(s): GLUCAP in the last 168  hours.     Signed:  Remiel Corti M  [facility] Pager: [phone] 11/10/2015, 11:58 AM

## 2015-11-10 NOTE — Progress Notes (Signed)
Patient: Randy [name] / Admit Date: 11/07/2015 / Date of Encounter: 11/10/2015, 9:20 AM   Subjective: Feeling better this AM. No CP or SOB. No palpitations or lightheadness/dizziness. Seen in HD.   Objective: Telemetry: NSR/SB HR upper 40s in the early AM hours, occ PVCs Physical Exam: Blood pressure 147/68, pulse 74, temperature 97.6 F (36.4 C), temperature source Oral, resp. rate 12, height 4\' 10"  (1.473 m), weight 129 lb 6.6 oz (58.7 kg), SpO2 95 %. General: Well developed thin AAM in no acute distress. Head: Normocephalic, atraumatic, sclera non-icteric, no xanthomas, nares are without discharge. Neck: JVP not elevated. Lungs: Clear bilaterally to auscultation without wheezes, rales, or rhonchi. Breathing is unlabored. Heart: RRR 2/6 SEM, no rubs or gallops.  Abdomen: Soft, non-tender, non-distended with normoactive bowel sounds. No rebound/guarding. Extremities: No clubbing or cyanosis. S/p bilateral LE amputation. Neuro: Alert and oriented X 3. Moves all extremities spontaneously. Psych:  Responds to questions appropriately with a normal affect.  No intake or output data in the 24 hours ending 11/10/15 0920  Inpatient Medications:  . amLODipine  10 mg Oral Daily  . aspirin  81 mg Oral Daily  . atorvastatin  80 mg Oral Daily  . calcitRIOL  0.5 mcg Oral Daily  . carvedilol  25 mg Oral BID WC  . docusate sodium  200 mg Oral Daily  . feeding supplement (NEPRO CARB STEADY)  237 mL Oral BID BM  . heparin  5,000 Units Subcutaneous 3 times per day  . hydrALAZINE  50 mg Oral 3 times per day  . latanoprost  1 drop Both Eyes QHS  . lisinopril  20 mg Oral BID  . pantoprazole  40 mg Oral Q1200  . sevelamer carbonate  2,400 mg Oral TID WC   And  . sevelamer carbonate  1,600 mg Oral With snacks   Infusions:  . sodium chloride 10 mL/hr at 11/08/15 0600  . nitroGLYCERIN 10 mcg/min (11/09/15 0400)    Labs:  Recent Labs  11/08/15 0327 11/10/15 0422  NA 138 134*  K 5.0 4.4   CL 97* 94*  CO2 25 26  GLUCOSE 138* 93  BUN 52* 54*  CREATININE 7.88* 7.57*  CALCIUM 9.7 8.9  PHOS  --  6.1*    Recent Labs  11/08/15 0030 11/08/15 0327  AST 21 22  ALT 14* 16*  ALKPHOS 77 76  BILITOT 0.5 0.6  PROT 7.4 7.4  ALBUMIN 3.3* 3.3*    Recent Labs  11/08/15 0030 11/08/15 0327 11/10/15 0422  WBC 10.4 10.4 6.4  NEUTROABS 8.3*  --   --   HGB 11.6* 11.1* 10.6*  HCT 38.5* 37.3* 34.1*  MCV 98.2 98.4 96.6  PLT 310 308 218    Recent Labs  11/08/15 0030 11/08/15 0327 11/08/15 0859 11/08/15 1526  TROPONINI 0.18* 0.20* 0.25* 0.33*   Invalid input(s): POCBNP No results for input(s): HGBA1C in the last 72 hours.   Radiology/Studies:  Dg Chest 2 View  11/04/2015  CLINICAL DATA:  Chest pain and hypertension EXAM: CHEST  2 VIEW COMPARISON:  October 21, 2015 and August 04, 2015 FINDINGS: There is chronic appearing volume loss in the left lower lobe region. Lungs elsewhere clear. Heart is mildly enlarged with pulmonary vascularity within normal limits. No adenopathy. There is atherosclerotic calcification throughout the aorta. No bone lesions. IMPRESSION: Chronic volume loss left lower lobe. Superimposed pneumonia in the left base cannot be excluded radiographically. Lungs elsewhere clear. Stable cardiac prominence. Extensive calcification throughout the aorta. Electronically Signed  By: Lowella Grip III M.D.   On: 11/04/2015 11:56   Dg Wrist Complete Right  10/20/2015  CLINICAL DATA:  Acute right wrist pain without known injury. EXAM: RIGHT WRIST - COMPLETE 3+ VIEW COMPARISON:  July 10, 2012. FINDINGS: There is no evidence of fracture or dislocation. Vascular calcifications are noted. Narrowing and sclerosis of the first carpometacarpal joint as well as several intercarpal joints is noted. IMPRESSION: Findings consistent with osteoarthritis involving the first carpometacarpal and several intercarpal joints. No acute abnormality seen in the right wrist.  Electronically Signed   By: Marijo Conception, M.D.   On: 10/20/2015 09:39   Dg Chest Port 1 View  10/21/2015  CLINICAL DATA:  Dyspnea and mid chest pain EXAM: PORTABLE CHEST 1 VIEW COMPARISON:  08/04/2015; 03/13/2015; 10/20/2014; 09/22/2014; 03/28/2014; 10/08/2012 FINDINGS: Grossly unchanged enlarged cardiac silhouette and mediastinal contours with atherosclerotic plaque within the thoracic aorta. Grossly unchanged small left-sided effusion with associated left basilar/retrocardiac heterogeneous/consolidative opacities. No new focal airspace opacities. Chronic pulmonary venous congestion without frank evidence of edema. No pneumothorax. Unchanged bones. IMPRESSION: 1. Stable examination without definite superimposed acute cardiopulmonary disease 2. Similar findings of cardiomegaly, pulmonary venous congestion, chronic left-sided effusion and associated left basilar opacities, likely atelectasis or scar. Electronically Signed   By: Sandi Mariscal M.D.   On: 10/21/2015 09:56   Dg Abd Acute W/chest  11/08/2015  CLINICAL DATA:  Central chest pain with weakness and vomiting tonight. Bilateral lower extremity amputee. History of cardiomyopathy, hypertension, coronary disease, stroke, hemodialysis, former smoker. EXAM: DG ABDOMEN ACUTE W/ 1V CHEST COMPARISON:  11/04/2015 FINDINGS: Cardiac enlargement with mild pulmonary vascular congestion. Slight interstitial pattern to the lung bases suggest mild edema. Small left pleural effusion with basilar atelectasis. Calcified and tortuous aorta. Scattered gas and stool in the colon. No small or large bowel distention. No free intra-abdominal air. No abnormal air-fluid levels. No radiopaque stones. Degenerative changes in the lumbar spine and hips. Diffuse vascular calcifications. IMPRESSION: Cardiac enlargement with mild pulmonary vascular congestion and mild interstitial edema. Small left pleural effusion with basilar atelectasis. Normal nonobstructive bowel gas pattern.  Electronically Signed   By: Lucienne Capers M.D.   On: 11/08/2015 01:43     Assessment and Plan  69M with history of chronic combined CHF, ESRD on HD, HTN (with hypotension during HD, h/o CP with high BP), CAD (diffuse mod disease with 80% ramus treated medically in 07/2014, NSTEMI 07/2015 in setting of fluid overload resolved with HD), mild-mod AS 07/2015, PAD s/p recent bilateral LE amputation, HLD, stroke, GIB 09/2014 admitted with hypertensive emergency and found to have new onset atrial fib.  1. Accelerated hypertension - BP improved on present regimen, but regimen has been adjusted. Will review regimen with MD as we may need to consider dropping carvedilol due to sinus bradycardia and consider resumption of Imdur. This is concern for compliance issues at home.  2. Newly recognized paroxysmal atrial fibrillation - per MD notes, given h/o GIB, he is high risk for bleeding and thus not a candidate for anticoagulation at this time. Continue ASA. Telemetry shows he is back in NSR but with intermittent bradycardia this AM. Will review beta blocker dose with MD.  3. Chronic combined CHF/ESRD on HD - volume managed by HD. Nephrology following.  4. Elevated troponin - ? due to demand ischemia in setting of known, medically-managed CAD. Continue anti-anginal therapy. Continue ASA, statin, BB, CCB. Consider resumption of low-dose Imdur.  SignedMelina Copa PA-C Pager: [phone]   Attending Note  Patient seen and discussed with PA Dunn, I agree with her documentation. Admitted with hypertensive emergency, chest pain, and elevated troponin. Symptoms resolved with bp control, he is now off NG drip. We have been titrating his oral regimen, recorded bp's yesterdary are all at goal. The bp spikes he has been reporting/experiencing at home not witnessed yesterday or with vital checks this AM. Current bp regimen is norvasc 10mg  daily, coreg 25mg  bid, hydralazine 50 tid, lisinopril 20mg  bid. Some noted  bradycardia on coreg, we will decrease to 12.5mg  bid. If additional bp control needed can increase hydralazine. We will restart home imdur now that he is off NG drip. New diagnosis of afib, with his prior diverticular bleed do not recommend anticoag at this time.   Zandra Abts MD

## 2015-11-14 ENCOUNTER — Telehealth: Payer: Self-pay | Admitting: Vascular Surgery

## 2015-11-14 NOTE — Telephone Encounter (Signed)
Spoke with pts wife to schedule appt on NP schedule tomorrow at 9:45am as requested, dpm

## 2015-11-14 NOTE — Telephone Encounter (Signed)
-----   Message from Denman George, RN sent at 11/14/2015 10:23 AM EST ----- Regarding: needs appt. moved up Contact: [phone] This pt's. HH RN called and inquired about getting the staples removed from Left AKA.  He was hospitalized on 2/15, and missed that appt.  Since he is about 5 weeks postop, can we work him into NP schedule tomorrow AM, as he dialyzes in [place] on Wed. @ 11:00 AM.  He is [facility]'s pt.  Is it poss. To move a morning pt. To a later time slot, and work Mr. Ralph in before he goes to HD?   If not poss., then will just keep the appt. next. Wed.   Thx.

## 2015-11-15 ENCOUNTER — Encounter: Payer: Self-pay | Admitting: Family

## 2015-11-15 ENCOUNTER — Ambulatory Visit (INDEPENDENT_AMBULATORY_CARE_PROVIDER_SITE_OTHER): Payer: Medicare Other | Admitting: Family

## 2015-11-15 VITALS — BP 121/63 | HR 78 | Temp 97.2°F | Resp 16 | Ht 69.0 in | Wt 128.0 lb

## 2015-11-15 DIAGNOSIS — Z87891 Personal history of nicotine dependence: Secondary | ICD-10-CM

## 2015-11-15 DIAGNOSIS — I7090 Unspecified atherosclerosis: Secondary | ICD-10-CM

## 2015-11-15 DIAGNOSIS — Z89612 Acquired absence of left leg above knee: Secondary | ICD-10-CM

## 2015-11-15 DIAGNOSIS — N186 End stage renal disease: Secondary | ICD-10-CM

## 2015-11-15 DIAGNOSIS — Z48812 Encounter for surgical aftercare following surgery on the circulatory system: Secondary | ICD-10-CM

## 2015-11-15 DIAGNOSIS — I749 Embolism and thrombosis of unspecified artery: Secondary | ICD-10-CM

## 2015-11-15 DIAGNOSIS — Z992 Dependence on renal dialysis: Secondary | ICD-10-CM

## 2015-11-15 DIAGNOSIS — I70262 Atherosclerosis of native arteries of extremities with gangrene, left leg: Secondary | ICD-10-CM

## 2015-11-15 DIAGNOSIS — Z89511 Acquired absence of right leg below knee: Secondary | ICD-10-CM

## 2015-11-15 NOTE — Progress Notes (Signed)
    Postoperative Visit   History of Present Illness  Randy [name] is a 82 y.o. male patient of Dr. Scot Dock who is s/p right below-the-knee amputation on 01/05/2015.  He is also s/p Left above-the-knee amputation on 10/10/15 by Dr. Scot Dock for ischemic left leg. He returns today for staples removal.  [name] left AKA stump incision is well healed.  The patient notes pain is well controlled.  [name] current symptoms are: none. He denies fever or chills. He is working with [employer] to get his right BKA prosthesis to fit better.  Of note, he has undergone a previous CT angiogram which was done on 12/31/2014. This showed diffuse atherosclerotic disease involving the aorta, iliac arteries, and also diffuse infrainguinal arterial occlusive disease. His only runoff bilaterally was the peroneal arteries. At the time of his last visit I did not think he was a candidate for revascularization. He has end-stage renal disease on dialysis (M-W-F via left arm AVF), coronary artery disease, congestive heart failure, and is on home O2.    For VQI Use Only  PRE-ADM LIVING: Home  AMB STATUS: Wheelchair  Physical Examination  Filed Vitals:   11/15/15 0945 11/15/15 0951  BP: 147/65 121/63  Pulse: 80 78  Temp: 97.2 F (36.2 C)   TempSrc: Oral   Resp: 16   Height: 5\' 9"  (1.753 m)   Weight: 128 lb (58.06 kg)   SpO2: 97%    Body mass index is 18.89 kg/(m^2).  Left AKA stump incision is healing well, no separation of the incision edges, no erythema, no drainage, no swelling, no pain.  Staples are intact.  Medical Decision Making  Randy [name] is a 82 y.o. male who presents s/p left above-the-knee amputation on 10/10/15. All staples removed.  Continue working with [employer] for proper fitting right BKA prosthesis.  [name] left stump is healing appropriately with resolution of pre-operative symptoms. I discussed in depth with the patient the nature of atherosclerosis, and  emphasized the importance of maximal medical management including strict control of blood pressure, blood glucose, and lipid levels, obtaining regular exercise, and cessation of smoking.  The patient is aware that without maximal medical management the underlying atherosclerotic disease process will progress, possibly leading to a more proximal amputation. The patient agrees to participate in their maximal medical care.  Thank you for allowing [place] to participate in this patient's care.  The patient can follow up with [place] as needed.  NICKEL, Sharmon Leyden, RN, MSN, FNP-C [facility] Office: [phone]  11/15/2015, 9:48 AM  Clinic MD: Scot Dock

## 2015-11-15 NOTE — Progress Notes (Signed)
Filed Vitals:   11/15/15 0945 11/15/15 0951  BP: 147/65 121/63  Pulse: 80 78  Temp: 97.2 F (36.2 C)   TempSrc: Oral   Resp: 16   Height: 5\' 9"  (1.753 m)   Weight: 128 lb (58.06 kg)   SpO2: 97%

## 2015-11-16 ENCOUNTER — Telehealth: Payer: Self-pay | Admitting: *Deleted

## 2015-11-16 NOTE — Telephone Encounter (Signed)
Juliann Pulse, nurse with [facility] called asking if a dressing needed to be placed over AKA site.  I explained that the site would not require a dressing or ace wrap and to use great caution when transferring. Juliann Pulse voiced understanding of the instructions.

## 2015-11-21 ENCOUNTER — Telehealth: Payer: Self-pay | Admitting: *Deleted

## 2015-11-21 ENCOUNTER — Telehealth: Payer: Self-pay | Admitting: Vascular Surgery

## 2015-11-21 NOTE — Telephone Encounter (Signed)
[employer] prescriptions faxed for evaluation for Left AKA prosthesis and for right BKA socket revisions.

## 2015-11-21 NOTE — Discharge Summary (Signed)
Physician Discharge Summary  Patient ID: Randy [name] MRN: [id number] DOB/AGE: 80-25-1933 80 y.o.  Admit date: 10/12/2015 Discharge date: 11/21/2015  Discharge Diagnoses:  Chest pain.  Active Problems:   Unilateral AKA (HCC)   S/P AKA (above knee amputation) unilateral (HCC)   History of right above knee amputation (HCC)   Benign essential HTN   Labile blood pressure   Muscle stiffness   Discharged Condition: Guarded.   Labs:  Basic Metabolic Panel: No results for input(s): NA, K, CL, CO2, GLUCOSE, BUN, CREATININE, CALCIUM, MG, PHOS in the last 168 hours.  CBC: No results for input(s): WBC, NEUTROABS, HGB, HCT, MCV, PLT in the last 168 hours.  CBG: No results for input(s): GLUCAP in the last 168 hours.   Brief HPI:   Randy [name] is a 81 y.o. 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 Patient elected to undergo L-AKA on 10/10/15 by Dr. Donnetta Hutching. Post op has had bleeding from stump requiring dressing changes. CIR was consulted for follow up therapy.   Hospital Course: Randy [name] was admitted to rehab 10/12/2015 for inpatient therapies to consist of PT, ST and OT at least three hours five days a week. Past admission physiatrist, therapy team and rehab RN have worked together to provide customized collaborative inpatient rehab. Blood pressures were monitored on bid basis and Imdur was increased to help with elevated readings. were relatively controlled. HD was ongoing on MWF after therapy sessions to help with tolerance of activity. ABLA has been monitored with serial CBC and has been supplemented by aranesp weekly.  He has had issues with neck stiffness and was educated on ROM and positioning. Neck spasms improved with use of low dose robaxin.  He did develop wrist pain due to rolling walker use and resting wrist splint was ordered to help with  symptoms.   Therapy was ongoing and patient was making steady progress. He was requiring min assist with ADL tasks and min to moderate assist with transfers.  He developed burning substernal pain with diaphoresis on 10/21/15. BP was elevated at 203/89 and symptoms improved with one dose of  SL NTG. His rehab stay was interrupted by discharge to acute floor for work up of chest pain.    Disposition:  Acute hospital.   Diet: Renal diet.       Medication List    ASK your doctor about these medications        aspirin 81 MG tablet  Take 81 mg by mouth daily.     isosorbide mononitrate 30 MG 24 hr tablet  Commonly known as:  IMDUR  Take 0.5 tablets (15 mg total) by mouth at bedtime.     LUMIGAN 0.01 % Soln  Generic drug:  bimatoprost  Place 1 drop into both eyes at bedtime.     multivitamin Tabs tablet  Take 1 tablet by mouth daily.     sevelamer carbonate 800 MG tablet  Commonly known as:  RENVELA  Take 1,600-2,400 mg by mouth See admin instructions. Take 3 tablets (2400 mg) with meals and 2 tablets (1600 mg) with snacks     SIMBRINZA 1-0.2 % Susp  Generic drug:  Brinzolamide-Brimonidine  Place 1 drop into both eyes 2 (two) times daily.         SignedBary Leriche 11/21/2015, 4:34 PM

## 2015-11-21 NOTE — Telephone Encounter (Addendum)
We did not have the contact info for the pt's niece; however I spoke to the pt's daughter to inform her that the prescriptions have been sent to [facility].  ----- Message from Mena Goes, RN sent at 11/21/2015 12:25 PM EST ----- Regarding: RE: perscription for shrinker and prosthesis I faxed them all they needed  ----- Message -----    From: Georgiann Mccoy    Sent: 11/21/2015  11:18 AM      To: [facility] Clinical Pool Subject: perscription for shrinker and prosthesis       [employer] wants a prescription for a shrinker and prosthesis for pt's L leg. Should I sch an appt for the pt?  Thank you, Selinda Eon

## 2015-11-22 ENCOUNTER — Encounter: Payer: Medicare Other | Admitting: Vascular Surgery

## 2015-11-30 ENCOUNTER — Encounter: Payer: Medicare Other | Admitting: Physical Medicine & Rehabilitation

## 2015-12-14 ENCOUNTER — Encounter: Payer: Self-pay | Admitting: Cardiology

## 2015-12-14 ENCOUNTER — Ambulatory Visit (INDEPENDENT_AMBULATORY_CARE_PROVIDER_SITE_OTHER): Payer: Medicare Other | Admitting: Cardiology

## 2015-12-14 VITALS — BP 110/60 | HR 82 | Ht 60.0 in

## 2015-12-14 DIAGNOSIS — I2510 Atherosclerotic heart disease of native coronary artery without angina pectoris: Secondary | ICD-10-CM

## 2015-12-14 DIAGNOSIS — E785 Hyperlipidemia, unspecified: Secondary | ICD-10-CM | POA: Diagnosis not present

## 2015-12-14 DIAGNOSIS — I350 Nonrheumatic aortic (valve) stenosis: Secondary | ICD-10-CM

## 2015-12-14 DIAGNOSIS — I5022 Chronic systolic (congestive) heart failure: Secondary | ICD-10-CM | POA: Diagnosis not present

## 2015-12-14 DIAGNOSIS — I10 Essential (primary) hypertension: Secondary | ICD-10-CM | POA: Diagnosis not present

## 2015-12-14 NOTE — Progress Notes (Signed)
Patient ID: Randy [name], male   DOB: Jul 18, 1932, 82 y.o.   MRN: [id number]     Clinical Summary Randy [name] is a 82 y.o.male seen today for follow up of the following medical problems  1. Chronic combined systolic/diastolic HF - echo [id number] LVEF 30-35% with restrictive diasotlic function, prior echo 35-40% - denies any SOB, denies edema - compliant with meds. Coreg dosing limited due to bradycardia, though appears back up to 25mg  bid since our last visit  - denies any SOB or DOE. No edema. - compliant with meds  2. ESRD - MWF goes to HD. Can have some low blood pressures during dialysis. Compliant with sessions.    3. HTN - when checks in AM typically 190/90s before taking meds, later in the day typically 130s/50s later in the day. Reports during high bp's in AM can have chest pain at times.   - admitted 10/2015 with HTN emergency, chest pain, and elevated troponin. Symptoms resolved with bp control. Coreg dosing limited due to bradycardia - reports bp's well controlled at home since last visit.    4. CAD - cath 07/2014 with diffuse mainly moderate disease, most significnat disease 80% ramus that was managed medically - he has had multiple admits with chest pain in the setting of volume overload or severe HTN with troponin elevation, symptoms resolve after HD or with bp control - reports since bp controlled at home no recurrence of chest pain.    5. Hyperlipidemia - compliant with statin  6. Aortic stenosis - mild to moderate by echo 07/2015. AVA 1.2, mean gradient 16. Probable component of low output contributing to the numbers.  - no recent SOB or DOE. No chest pain or syncope.    7. PAD - followed by vascular, s/p amputation of bother right and left lower extremities  8. Afib - no anticoag due to recent Gi bleed - rate controlled with coreg - no recent palpitations   Past Medical History  Diagnosis Date  . Hypercholesterolemia   . Gout   . Nonischemic  cardiomyopathy (HCC)     LVEF 35-40%  . Anemia of chronic disease   . Essential hypertension   . Arthritis   . Peripheral vascular disease ([place])     Status post right below knee amputation for a nonhealing wound of the right foot 12/2014  . History of pneumonia 2014  . CAD (coronary artery disease)     Moderate multivessel disease 07/2015 - managed medically  . History of myopericarditis 2015    07/2015  . Stroke [facility])     "they said I did"  "I did not know anything about it"  . ESRD on hemodialysis [facility])     M/W/F in [place] - Dr. Florene Glen  . Constipation   . History of blood transfusion   . Pneumonia   . Acute myopericarditis 2015     No Known Allergies   Current Outpatient Prescriptions  Medication Sig Dispense Refill  . amLODipine (NORVASC) 10 MG tablet Take 0.5 tablets (5 mg total) by mouth daily.    Marland Kitchen aspirin 81 MG tablet Take 81 mg by mouth daily.    Marland Kitchen atorvastatin (LIPITOR) 80 MG tablet Take 1 tablet (80 mg total) by mouth daily. 90 tablet 3  . calcitRIOL (ROCALTROL) 0.5 MCG capsule Take 1 capsule (0.5 mcg total) by mouth daily. 30 capsule 3  . carvedilol (COREG) 25 MG tablet Take 1 tablet (25 mg total) by mouth 2 (two) times daily with a meal.  60 tablet 1  . docusate sodium (COLACE) 100 MG capsule Take 2 capsules (200 mg total) by mouth daily. 10 capsule 0  . gabapentin (NEURONTIN) 100 MG capsule Take 1 capsule (100 mg total) by mouth at bedtime. (Patient taking differently: Take 100 mg by mouth at bedtime as needed (for neuropathy). )    . hydrALAZINE (APRESOLINE) 50 MG tablet Take 1 tablet (50 mg total) by mouth every 8 (eight) hours. 90 tablet 3  . isosorbide mononitrate (IMDUR) 30 MG 24 hr tablet Take 0.5 tablets (15 mg total) by mouth at bedtime. 45 tablet 3  . lidocaine-prilocaine (EMLA) cream Apply 1 application topically as needed. For dialysis  10  . lisinopril (PRINIVIL,ZESTRIL) 20 MG tablet Take 1 tablet (20 mg total) by mouth 2 (two) times daily. 60 tablet 3   . LUMIGAN 0.01 % SOLN Place 1 drop into both eyes at bedtime.  11  . multivitamin (RENA-VIT) TABS tablet Take 1 tablet by mouth daily.    Marland Kitchen oxyCODONE (OXY IR/ROXICODONE) 5 MG immediate release tablet Take 1 tablet (5 mg total) by mouth every 6 (six) hours as needed for moderate pain or severe pain. (Patient not taking: Reported on 11/15/2015) 50 tablet 0  . pantoprazole (PROTONIX) 40 MG tablet Take 1 tablet (40 mg total) by mouth daily at 12 noon.    . polyvinyl alcohol (LIQUIFILM TEARS) 1.4 % ophthalmic solution Place 1 drop into both eyes as needed for dry eyes. 15 mL 0  . sevelamer carbonate (RENVELA) 800 MG tablet Take 1,600-2,400 mg by mouth See admin instructions. Take 3 tablets (2400 mg) with meals and 2 tablets (1600 mg) with snacks    . SIMBRINZA 1-0.2 % SUSP Place 1 drop into both eyes 2 (two) times daily.  11   No current facility-administered medications for this visit.     Past Surgical History  Procedure Laterality Date  . Knee arthroscopy Right 2007  . Back surgery    . Hemorrhoid surgery  1970's  . Ganglion cyst excision  01/03/2012    Procedure: REMOVAL GANGLION OF WRIST;  Surgeon: Scherry Ran, MD;  Location: [facility];  Service: General;  Laterality: Right;  . Colonoscopy    . Av fistula placement  08/24/2012    Procedure: ARTERIOVENOUS (AV) FISTULA CREATION;  Surgeon: Rosetta Posner, MD;  Location: [place];  Service: Vascular;  Laterality: Left;  . Insertion of dialysis catheter Right      neck  . Insertion of dialysis catheter  10/19/2012    Procedure: INSERTION OF DIALYSIS CATHETER;  Surgeon: Rosetta Posner, MD;  Location: [place];  Service: Vascular;  Laterality: N/A;  REMOVE TEMPORARY CATH  . Cholecystectomy    . Lumbar disc surgery  2004  . Left heart catheterization with coronary angiogram N/A 08/17/2014    Procedure: LEFT HEART CATHETERIZATION WITH CORONARY ANGIOGRAM;  Surgeon: Larey Dresser, MD;  Location: [facility] CATH LAB;  Service: Cardiovascular;  Laterality: N/A;    . Colonoscopy Left 09/26/2014    Procedure: COLONOSCOPY;  Surgeon: Arta Silence, MD;  Location[facility] ENDOSCOPY;  Service: Endoscopy;  Laterality: Left;  . Amputation Right 01/05/2015    Procedure: AMPUTATION BELOW KNEE;  Surgeon: Angelia Mould, MD;  Location: [place];  Service: Vascular;  Laterality: Right;  . Capd removal N/A 03/13/2015    Procedure: CONTINUOUS AMBULATORY PERITONEAL DIALYSIS  (CAPD) CATHETER REMOVAL;  Surgeon: Coralie Keens, MD;  Location: [place];  Service: General;  Laterality: N/A;  . Cardiac catheterization    .  Above knee leg amputation Left 10/10/2015  . Amputation Left 10/10/2015    Procedure: AMPUTATION ABOVE KNEE LEFT;  Surgeon: Angelia Mould, MD;  Location: [place];  Service: Vascular;  Laterality: Left;     No Known Allergies    Family History  Problem Relation Age of Onset  . Arthritis    . Cancer    . Kidney disease    . Anesthesia problems Neg Hx   . Hypotension Neg Hx   . Malignant hyperthermia Neg Hx   . Pseudochol deficiency Neg Hx   . Cancer Sister   . Colon cancer Brother   . Colon cancer Brother      Social History Mr. Kotila reports that he quit smoking about 17 years ago. His smoking use included Cigarettes. He has a 30 pack-year smoking history. He quit smokeless tobacco use about 21 years ago. His smokeless tobacco use included Chew. Mr. Vert reports that he does not drink alcohol.   Review of Systems CONSTITUTIONAL: No weight loss, fever, chills, weakness or fatigue.  HEENT: Eyes: No visual loss, blurred vision, double vision or yellow sclerae.No hearing loss, sneezing, congestion, runny nose or sore throat.  SKIN: No rash or itching.  CARDIOVASCULAR: per HPI RESPIRATORY: No shortness of breath, cough or sputum.  GASTROINTESTINAL: No anorexia, nausea, vomiting or diarrhea. No abdominal pain or blood.  GENITOURINARY: No burning on urination, no polyuria NEUROLOGICAL: No headache, dizziness, syncope, paralysis, ataxia,  numbness or tingling in the extremities. No change in bowel or bladder control.  MUSCULOSKELETAL: No muscle, back pain, joint pain or stiffness.  LYMPHATICS: No enlarged nodes. No history of splenectomy.  PSYCHIATRIC: No history of depression or anxiety.  ENDOCRINOLOGIC: No reports of sweating, cold or heat intolerance. No polyuria or polydipsia.  Marland Kitchen   Physical Examination Filed Vitals:   12/14/15 1012  BP: 110/60  Pulse: 82   Filed Vitals:   12/14/15 1012  Height: 5' (1.524 m)    Gen: resting comfortably, no acute distress HEENT: no scleral icterus, pupils equal round and reactive, no palptable cervical adenopathy,  CV: RRR, 3/6 systolic murmur RUSB, no jvd Resp: Clear to auscultation bilaterally GI: abdomen is soft, non-tender, non-distended, normal bowel sounds, no hepatosplenomegaly MSK: extremities are warm, no edema.  Skin: warm, no rash Neuro:  no focal deficits Psych: appropriate affect   Diagnostic Studies  07/2015 echo Study Conclusions  - Left ventricle: The cavity size was normal. Wall thickness was increased increased in a pattern of mild to moderate LVH. Incidentally noted transverse false tendon in LV. Systolic function was moderately to severely reduced. The estimated ejection fraction was in the range of 30% to 35%. Diffuse hypokinesis. There is severe hypokinesis of the basal-midinferolateral and inferior myocardium. Doppler parameters are consistent with restrictive physiology, indicative of decreased left ventricular diastolic compliance and/or increased left atrial pressure. - Aortic valve: Moderately calcified annulus. Trileaflet; mildly calcified leaflets. Cusp separation was reduced. There was moderate stenosis. There was trivial regurgitation. Mean gradient (S): 16 mm Hg. VTI ratio of LVOT to aortic valve: 0.4. Valve area (VTI): 1.24 cm^2. Valve area (Vmax): 1.21 cm^2. - Mitral valve: Calcified annulus. There was  trivial regurgitation. - Left atrium: The atrium was moderately dilated. - Right atrium: The atrium was moderately to severely dilated. Central venous pressure (est): 3 mm Hg. - Atrial septum: No defect or patent foramen ovale was identified. - Tricuspid valve: There was mild regurgitation. - Pulmonary arteries: PA peak pressure: 38 mm Hg (S). - Pericardium, extracardiac:  There was no pericardial effusion.  Impressions:  - Mild to moderate LVH with LVEF approximately 30%, diffuse hypokinesis, most severe in the mid to basal inferior and inferolateral wall. Diastolic filling pattern consistent with restrictive physiology. Moderate left atrial enlargement. Moderate calcific aortic stenosis with trivial aortic regurgitation. Mild tricuspid regurgitation with PASP 38 mmHg. Moderate to severe right atrial enlargement. Compared to the previous study from December 2015, there has been further reduction in LVEF, and progression in degree of aortic stenosis.   07/2014 cath Procedural Findings: Hemodynamics:  AO 129/76  Coronary angiography: Coronary dominance: right  Left mainstem: Short, no significant disease.   Left anterior descending (LAD): There was a moderate D1 that branched early with 40% ostial stenosis. This was followed by a large septal perforator. There was 40-50% proximal LAD stenosis at the takeoff of the septal perforator. 30% mid LAD stenosis at D2.   Left circumflex (LCx): There was a large ramus that branched early into a superior and and inferior division, both vessels were moderate in size. Both divisions of the ramus had relatively up to 80% proximal stenosis. The AV LCx itself had luminal irregularities.   Right coronary artery (RCA): There was an early large acute marginal with 40% proximal and 40% mid vessel stenosis. The RCA had diffuse luminal irregularities and up to 40% mid vessel stenosis.   Left ventriculography: Not  done, echo was done earlier today.   Final Conclusions: Most significant disease appeared to be 80% proximal stenoses in the moderate superior and inferior divisions of the ramus. These lesions do not look like culprits for ACS (plaque rupture). I think that the main process here is likely myopericarditis in the setting of renal disease and ?inadequate dialysis (does PD at home). I will continue high dose aspirin 650 q8 hrs and will give 1 dose of colchicine. Stop heparin drip with small percardial effusion and cycle troponin to peak.      Assessment and Plan  1. Chronic combined systolic/diastolic HF - no current symptoms. Continue current medical therapy.   2. ESRD - continue HD per renal.   3. CAD - multiple recent admits with hypertensive emergency, mild troponin elevation. SInce bp's have been controlled no recurrent symptoms - continue current meds  4. Hyperlipidemia - in setting of PAD and CAD conitnue high dose statin .   5. Aortic stenosis - mild to moderate by echo 07/2015, continue to monitor. - no current symptoms  6. PAD - per vascular  7. HTN - at goal, continue current meds    F/u 4 months   Arnoldo Lenis, M.D

## 2015-12-14 NOTE — Patient Instructions (Signed)
Medication Instructions:  Your physician recommends that you continue on your current medications as directed. Please refer to the Current Medication list given to you today.   Labwork: NONE  Testing/Procedures: NONE  Follow-Up: Your physician recommends that you schedule a follow-up appointment in: 4 MONTHS    Any Other Special Instructions Will Be Listed Below (If Applicable).     If you need a refill on your cardiac medications before your next appointment, please call your pharmacy.   

## 2015-12-15 ENCOUNTER — Telehealth: Payer: Self-pay | Admitting: *Deleted

## 2015-12-15 NOTE — Telephone Encounter (Signed)
LMTCB -  Pt had called into Triage regarding LLE pain, he states that it is not like his usual phantom pain. Recently sent to [facility] for evaluation for Left AKA prosthesis and for right BKA socket revision. Don't know if this could be the reason for his pain. Will wait for his call back to get more information.

## 2015-12-17 ENCOUNTER — Inpatient Hospital Stay (HOSPITAL_COMMUNITY)
Admission: EM | Admit: 2015-12-17 | Discharge: 2015-12-20 | DRG: 304 | Disposition: A | Discharge status: Home Health | Payer: Medicare Other | Attending: Internal Medicine | Admitting: Internal Medicine

## 2015-12-17 ENCOUNTER — Emergency Department (HOSPITAL_COMMUNITY): Payer: Medicare Other

## 2015-12-17 ENCOUNTER — Encounter (HOSPITAL_COMMUNITY): Payer: Self-pay | Admitting: *Deleted

## 2015-12-17 DIAGNOSIS — R079 Chest pain, unspecified: Secondary | ICD-10-CM | POA: Diagnosis present

## 2015-12-17 DIAGNOSIS — I160 Hypertensive urgency: Principal | ICD-10-CM | POA: Diagnosis present

## 2015-12-17 DIAGNOSIS — R778 Other specified abnormalities of plasma proteins: Secondary | ICD-10-CM

## 2015-12-17 DIAGNOSIS — I350 Nonrheumatic aortic (valve) stenosis: Secondary | ICD-10-CM | POA: Diagnosis present

## 2015-12-17 DIAGNOSIS — I132 Hypertensive heart and chronic kidney disease with heart failure and with stage 5 chronic kidney disease, or end stage renal disease: Secondary | ICD-10-CM | POA: Diagnosis present

## 2015-12-17 DIAGNOSIS — Z89519 Acquired absence of unspecified leg below knee: Secondary | ICD-10-CM

## 2015-12-17 DIAGNOSIS — I10 Essential (primary) hypertension: Secondary | ICD-10-CM | POA: Diagnosis not present

## 2015-12-17 DIAGNOSIS — I169 Hypertensive crisis, unspecified: Secondary | ICD-10-CM | POA: Diagnosis present

## 2015-12-17 DIAGNOSIS — E785 Hyperlipidemia, unspecified: Secondary | ICD-10-CM | POA: Diagnosis present

## 2015-12-17 DIAGNOSIS — Z8673 Personal history of transient ischemic attack (TIA), and cerebral infarction without residual deficits: Secondary | ICD-10-CM

## 2015-12-17 DIAGNOSIS — Z7982 Long term (current) use of aspirin: Secondary | ICD-10-CM

## 2015-12-17 DIAGNOSIS — I161 Hypertensive emergency: Secondary | ICD-10-CM

## 2015-12-17 DIAGNOSIS — I209 Angina pectoris, unspecified: Secondary | ICD-10-CM

## 2015-12-17 DIAGNOSIS — I2510 Atherosclerotic heart disease of native coronary artery without angina pectoris: Secondary | ICD-10-CM | POA: Diagnosis present

## 2015-12-17 DIAGNOSIS — D638 Anemia in other chronic diseases classified elsewhere: Secondary | ICD-10-CM | POA: Diagnosis present

## 2015-12-17 DIAGNOSIS — R0789 Other chest pain: Secondary | ICD-10-CM

## 2015-12-17 DIAGNOSIS — I429 Cardiomyopathy, unspecified: Secondary | ICD-10-CM | POA: Diagnosis present

## 2015-12-17 DIAGNOSIS — I5042 Chronic combined systolic (congestive) and diastolic (congestive) heart failure: Secondary | ICD-10-CM | POA: Diagnosis present

## 2015-12-17 DIAGNOSIS — Z89511 Acquired absence of right leg below knee: Secondary | ICD-10-CM

## 2015-12-17 DIAGNOSIS — Z89612 Acquired absence of left leg above knee: Secondary | ICD-10-CM

## 2015-12-17 DIAGNOSIS — N186 End stage renal disease: Secondary | ICD-10-CM | POA: Diagnosis present

## 2015-12-17 DIAGNOSIS — Z992 Dependence on renal dialysis: Secondary | ICD-10-CM

## 2015-12-17 DIAGNOSIS — I739 Peripheral vascular disease, unspecified: Secondary | ICD-10-CM | POA: Diagnosis present

## 2015-12-17 DIAGNOSIS — I248 Other forms of acute ischemic heart disease: Secondary | ICD-10-CM | POA: Diagnosis present

## 2015-12-17 DIAGNOSIS — Z89619 Acquired absence of unspecified leg above knee: Secondary | ICD-10-CM

## 2015-12-17 DIAGNOSIS — R7989 Other specified abnormal findings of blood chemistry: Secondary | ICD-10-CM

## 2015-12-17 DIAGNOSIS — N2581 Secondary hyperparathyroidism of renal origin: Secondary | ICD-10-CM | POA: Diagnosis present

## 2015-12-17 LAB — CBC WITH DIFFERENTIAL/PLATELET
Basophils Absolute: 0 10*3/uL (ref 0.0–0.1)
Basophils Relative: 0 %
Eosinophils Absolute: 0.2 10*3/uL (ref 0.0–0.7)
Eosinophils Relative: 2 %
HCT: 37.5 % — ABNORMAL LOW (ref 39.0–52.0)
HEMOGLOBIN: 11 g/dL — AB (ref 13.0–17.0)
LYMPHS ABS: 0.6 10*3/uL — AB (ref 0.7–4.0)
LYMPHS PCT: 9 %
MCH: 28.1 pg (ref 26.0–34.0)
MCHC: 29.3 g/dL — ABNORMAL LOW (ref 30.0–36.0)
MCV: 95.9 fL (ref 78.0–100.0)
Monocytes Absolute: 0.6 10*3/uL (ref 0.1–1.0)
Monocytes Relative: 9 %
NEUTROS ABS: 5.6 10*3/uL (ref 1.7–7.7)
NEUTROS PCT: 80 %
Platelets: 253 10*3/uL (ref 150–400)
RBC: 3.91 MIL/uL — AB (ref 4.22–5.81)
RDW: 19.5 % — ABNORMAL HIGH (ref 11.5–15.5)
WBC: 7 10*3/uL (ref 4.0–10.5)

## 2015-12-17 LAB — CBC
HEMATOCRIT: 36.3 % — AB (ref 39.0–52.0)
HEMOGLOBIN: 10.5 g/dL — AB (ref 13.0–17.0)
MCH: 27.3 pg (ref 26.0–34.0)
MCHC: 28.9 g/dL — ABNORMAL LOW (ref 30.0–36.0)
MCV: 94.3 fL (ref 78.0–100.0)
Platelets: 255 10*3/uL (ref 150–400)
RBC: 3.85 MIL/uL — ABNORMAL LOW (ref 4.22–5.81)
RDW: 19.4 % — ABNORMAL HIGH (ref 11.5–15.5)
WBC: 6.1 10*3/uL (ref 4.0–10.5)

## 2015-12-17 LAB — BASIC METABOLIC PANEL
Anion gap: 15 (ref 5–15)
BUN: 54 mg/dL — ABNORMAL HIGH (ref 6–20)
CHLORIDE: 95 mmol/L — AB (ref 101–111)
CO2: 26 mmol/L (ref 22–32)
Calcium: 9.5 mg/dL (ref 8.9–10.3)
Creatinine, Ser: 8.62 mg/dL — ABNORMAL HIGH (ref 0.61–1.24)
GFR calc non Af Amer: 5 mL/min — ABNORMAL LOW (ref >60)
GFR, EST AFRICAN AMERICAN: 6 mL/min — AB (ref >60)
Glucose, Bld: 158 mg/dL — ABNORMAL HIGH (ref 65–99)
POTASSIUM: 4.8 mmol/L (ref 3.5–5.1)
SODIUM: 136 mmol/L (ref 135–145)

## 2015-12-17 LAB — CREATININE, SERUM
Creatinine, Ser: 9.21 mg/dL — ABNORMAL HIGH (ref 0.61–1.24)
GFR, EST AFRICAN AMERICAN: 5 mL/min — AB (ref >60)
GFR, EST NON AFRICAN AMERICAN: 5 mL/min — AB (ref >60)

## 2015-12-17 LAB — TROPONIN I
TROPONIN I: 0.14 ng/mL — AB (ref <0.031)
TROPONIN I: 0.87 ng/mL — AB (ref <0.031)
Troponin I: 0.33 ng/mL — ABNORMAL HIGH (ref <0.031)

## 2015-12-17 MED ORDER — DOCUSATE SODIUM 100 MG PO CAPS
200.0000 mg | ORAL_CAPSULE | Freq: Every day | ORAL | Status: DC
  Administered 2015-12-18: 200 mg via ORAL
  Filled 2015-12-17 (×2): qty 2

## 2015-12-17 MED ORDER — SEVELAMER CARBONATE 800 MG PO TABS
2400.0000 mg | ORAL_TABLET | Freq: Three times a day (TID) | ORAL | Status: DC
  Administered 2015-12-18 – 2015-12-19 (×5): 2400 mg via ORAL
  Filled 2015-12-17 (×5): qty 3

## 2015-12-17 MED ORDER — ASPIRIN EC 81 MG PO TBEC
324.0000 mg | DELAYED_RELEASE_TABLET | Freq: Every day | ORAL | Status: DC
  Administered 2015-12-18 – 2015-12-19 (×2): 324 mg via ORAL
  Filled 2015-12-17 (×2): qty 4

## 2015-12-17 MED ORDER — HYDRALAZINE HCL 50 MG PO TABS
75.0000 mg | ORAL_TABLET | Freq: Three times a day (TID) | ORAL | Status: DC
  Administered 2015-12-17 – 2015-12-19 (×5): 75 mg via ORAL
  Filled 2015-12-17 (×6): qty 1

## 2015-12-17 MED ORDER — ATORVASTATIN CALCIUM 80 MG PO TABS
80.0000 mg | ORAL_TABLET | Freq: Every day | ORAL | Status: DC
  Administered 2015-12-18 – 2015-12-19 (×2): 80 mg via ORAL
  Filled 2015-12-17 (×2): qty 1

## 2015-12-17 MED ORDER — LISINOPRIL 20 MG PO TABS: 20.0000 mg | ORAL_TABLET | Freq: Two times a day (BID) | ORAL | Status: DC

## 2015-12-17 MED ORDER — HEPARIN SODIUM (PORCINE) 5000 UNIT/ML IJ SOLN
5000.0000 [IU] | Freq: Three times a day (TID) | INTRAMUSCULAR | Status: DC
  Administered 2015-12-17: 5000 [IU] via SUBCUTANEOUS
  Filled 2015-12-17: qty 1

## 2015-12-17 MED ORDER — MORPHINE SULFATE (PF) 2 MG/ML IV SOLN: 1.0000 mg | INTRAVENOUS | Status: DC | PRN

## 2015-12-17 MED ORDER — ACETAMINOPHEN 325 MG PO TABS: 650.0000 mg | ORAL_TABLET | ORAL | Status: DC | PRN

## 2015-12-17 MED ORDER — OXYCODONE HCL 5 MG PO TABS: 5.0000 mg | ORAL_TABLET | Freq: Four times a day (QID) | ORAL | Status: DC | PRN

## 2015-12-17 MED ORDER — LISINOPRIL 40 MG PO TABS
40.0000 mg | ORAL_TABLET | Freq: Every day | ORAL | Status: DC
  Administered 2015-12-17 – 2015-12-19 (×3): 40 mg via ORAL
  Filled 2015-12-17 (×3): qty 1

## 2015-12-17 MED ORDER — POLYVINYL ALCOHOL 1.4 % OP SOLN
1.0000 [drp] | OPHTHALMIC | Status: DC | PRN
  Filled 2015-12-17: qty 15

## 2015-12-17 MED ORDER — HYDRALAZINE HCL 20 MG/ML IJ SOLN: 10.0000 mg | Freq: Four times a day (QID) | INTRAMUSCULAR | Status: DC | PRN

## 2015-12-17 MED ORDER — CALCITRIOL 0.5 MCG PO CAPS
0.5000 ug | ORAL_CAPSULE | Freq: Every day | ORAL | Status: DC
  Administered 2015-12-18 – 2015-12-19 (×2): 0.5 ug via ORAL
  Filled 2015-12-17 (×2): qty 1

## 2015-12-17 MED ORDER — GABAPENTIN 100 MG PO CAPS
100.0000 mg | ORAL_CAPSULE | Freq: Every day | ORAL | Status: DC
Start: 2015-12-17 — End: 2015-12-20
  Administered 2015-12-17 – 2015-12-19 (×3): 100 mg via ORAL
  Filled 2015-12-17 (×3): qty 1

## 2015-12-17 MED ORDER — NITROGLYCERIN 0.4 MG SL SUBL: 0.4000 mg | SUBLINGUAL_TABLET | SUBLINGUAL | Status: DC | PRN

## 2015-12-17 MED ORDER — AMLODIPINE BESYLATE 5 MG PO TABS
5.0000 mg | ORAL_TABLET | Freq: Every day | ORAL | Status: DC
  Administered 2015-12-18 – 2015-12-19 (×2): 5 mg via ORAL
  Filled 2015-12-17 (×2): qty 1

## 2015-12-17 MED ORDER — RENA-VITE PO TABS
1.0000 | ORAL_TABLET | Freq: Every day | ORAL | Status: DC
  Administered 2015-12-18: 1 via ORAL
  Filled 2015-12-17 (×3): qty 1

## 2015-12-17 MED ORDER — HYDRALAZINE HCL 50 MG PO TABS: 50.0000 mg | ORAL_TABLET | Freq: Three times a day (TID) | ORAL | Status: DC

## 2015-12-17 MED ORDER — SEVELAMER CARBONATE 800 MG PO TABS: 1600.0000 mg | ORAL_TABLET | ORAL | Status: DC | PRN

## 2015-12-17 MED ORDER — PANTOPRAZOLE SODIUM 40 MG PO TBEC
40.0000 mg | DELAYED_RELEASE_TABLET | Freq: Every day | ORAL | Status: DC
  Administered 2015-12-19: 40 mg via ORAL
  Filled 2015-12-17: qty 1

## 2015-12-17 MED ORDER — CARVEDILOL 25 MG PO TABS
25.0000 mg | ORAL_TABLET | Freq: Two times a day (BID) | ORAL | Status: DC
  Administered 2015-12-17 – 2015-12-19 (×3): 25 mg via ORAL
  Filled 2015-12-17 (×3): qty 1

## 2015-12-17 MED ORDER — LATANOPROST 0.005 % OP SOLN
1.0000 [drp] | Freq: Every day | OPHTHALMIC | Status: DC
  Administered 2015-12-17 – 2015-12-19 (×3): 1 [drp] via OPHTHALMIC
  Filled 2015-12-17: qty 2.5

## 2015-12-17 MED ORDER — ISOSORBIDE MONONITRATE ER 30 MG PO TB24
15.0000 mg | ORAL_TABLET | Freq: Every day | ORAL | Status: DC
  Administered 2015-12-17 – 2015-12-19 (×3): 15 mg via ORAL
  Filled 2015-12-17 (×3): qty 1

## 2015-12-17 MED ORDER — ONDANSETRON HCL 4 MG/2ML IJ SOLN: 4.0000 mg | Freq: Four times a day (QID) | INTRAMUSCULAR | Status: DC | PRN

## 2015-12-17 NOTE — ED Provider Notes (Signed)
CSN: [id number]     Arrival date & time 12/17/15  1152 History   First MD Initiated Contact with Patient 12/17/15 1236     Chief Complaint  Patient presents with  . Chest Pain  . Hypertension     (Consider location/radiation/quality/duration/timing/severity/associated sxs/prior Treatment) HPI  82 year old male with a history of cardiomyopathy, CAD, and end-stage renal disease on dialysis presents with recurrent chest pain. Started around 6 in the morning and lasted several hours. When EMS arrived and gave him one nitroglycerin his pain completely resolved. Patient states his blood pressure was [id number] systolic while the pain was going on. He states he gets pain like this whenever his blood pressure elevates. Currently is pain-free. No associated short of breath, diaphoresis, or nausea/vomiting. Patient last had dialysis 2 days ago and is due again tomorrow. He had a full session. Patient states the pain was like a burning which is also typical for him.  Past Medical History  Diagnosis Date  . Hypercholesterolemia   . Gout   . Nonischemic cardiomyopathy (HCC)     LVEF 35-40%  . Anemia of chronic disease   . Essential hypertension   . Arthritis   . Peripheral vascular disease ([place])     Status post right below knee amputation for a nonhealing wound of the right foot 12/2014  . History of pneumonia 2014  . CAD (coronary artery disease)     Moderate multivessel disease 07/2015 - managed medically  . History of myopericarditis 2015    07/2015  . Stroke [facility])     "they said I did"  "I did not know anything about it"  . ESRD on hemodialysis [facility])     M/W/F in [place] - Dr. Florene Glen  . Constipation   . History of blood transfusion   . Pneumonia   . Acute myopericarditis 2015   Past Surgical History  Procedure Laterality Date  . Knee arthroscopy Right 2007  . Back surgery    . Hemorrhoid surgery  1970's  . Ganglion cyst excision  01/03/2012    Procedure: REMOVAL GANGLION OF WRIST;   Surgeon: Scherry Ran, MD;  Location: [facility];  Service: General;  Laterality: Right;  . Colonoscopy    . Av fistula placement  08/24/2012    Procedure: ARTERIOVENOUS (AV) FISTULA CREATION;  Surgeon: Rosetta Posner, MD;  Location: [place];  Service: Vascular;  Laterality: Left;  . Insertion of dialysis catheter Right      neck  . Insertion of dialysis catheter  10/19/2012    Procedure: INSERTION OF DIALYSIS CATHETER;  Surgeon: Rosetta Posner, MD;  Location: [place];  Service: Vascular;  Laterality: N/A;  REMOVE TEMPORARY CATH  . Cholecystectomy    . Lumbar disc surgery  2004  . Left heart catheterization with coronary angiogram N/A 08/17/2014    Procedure: LEFT HEART CATHETERIZATION WITH CORONARY ANGIOGRAM;  Surgeon: Larey Dresser, MD;  Location: [facility] CATH LAB;  Service: Cardiovascular;  Laterality: N/A;  . Colonoscopy Left 09/26/2014    Procedure: COLONOSCOPY;  Surgeon: Arta Silence, MD;  Location: [facility] ENDOSCOPY;  Service: Endoscopy;  Laterality: Left;  . Amputation Right 01/05/2015    Procedure: AMPUTATION BELOW KNEE;  Surgeon: Angelia Mould, MD;  Location: [place];  Service: Vascular;  Laterality: Right;  . Capd removal N/A 03/13/2015    Procedure: CONTINUOUS AMBULATORY PERITONEAL DIALYSIS  (CAPD) CATHETER REMOVAL;  Surgeon: Coralie Keens, MD;  Location: [place];  Service: General;  Laterality: N/A;  . Cardiac catheterization    .  Above knee leg amputation Left 10/10/2015  . Amputation Left 10/10/2015    Procedure: AMPUTATION ABOVE KNEE LEFT;  Surgeon: Angelia Mould, MD;  Location[facility] OR;  Service: Vascular;  Laterality: Left;   Family History  Problem Relation Age of Onset  . Arthritis    . Cancer    . Kidney disease    . Anesthesia problems Neg Hx   . Hypotension Neg Hx   . Malignant hyperthermia Neg Hx   . Pseudochol deficiency Neg Hx   . Cancer Sister   . Colon cancer Brother   . Colon cancer Brother    Social History  Substance Use Topics  . Smoking status: Former  Smoker -- 1.00 packs/day for 30 years    Types: Cigarettes    Quit date: 08/19/1998  . Smokeless tobacco: Former Systems developer    Types: Chew    Quit date: 12/31/1993  . Alcohol Use: No    Review of Systems  Respiratory: Negative for cough and shortness of breath.   Cardiovascular: Positive for chest pain.  Gastrointestinal: Negative for nausea, vomiting and abdominal pain.  All other systems reviewed and are negative.     Allergies  Review of patient's allergies indicates no known allergies.  Home Medications   Prior to Admission medications   Medication Sig Start Date End Date Taking? Authorizing Provider  amLODipine (NORVASC) 10 MG tablet Take 0.5 tablets (5 mg total) by mouth daily. 10/31/15  Yes Ivan Anchors Love, PA-C  aspirin 81 MG tablet Take 81 mg by mouth daily.   Yes Historical Provider, MD  atorvastatin (LIPITOR) 80 MG tablet Take 1 tablet (80 mg total) by mouth daily. 11/02/15  Yes Arnoldo Lenis, MD  calcitRIOL (ROCALTROL) 0.5 MCG capsule Take 1 capsule (0.5 mcg total) by mouth daily. 11/10/15  Yes Lucia Gaskins, MD  carvedilol (COREG) 25 MG tablet Take 1 tablet (25 mg total) by mouth 2 (two) times daily with a meal. 10/31/15  Yes Ivan Anchors Love, PA-C  docusate sodium (COLACE) 100 MG capsule Take 2 capsules (200 mg total) by mouth daily. 10/31/15  Yes Ivan Anchors Love, PA-C  gabapentin (NEURONTIN) 100 MG capsule Take 1 capsule (100 mg total) by mouth at bedtime. Patient taking differently: Take 100 mg by mouth at bedtime as needed (for neuropathy).  10/23/15  Yes Barton Dubois, MD  hydrALAZINE (APRESOLINE) 50 MG tablet Take 1 tablet (50 mg total) by mouth every 8 (eight) hours. 11/10/15  Yes Lucia Gaskins, MD  isosorbide mononitrate (IMDUR) 30 MG 24 hr tablet Take 0.5 tablets (15 mg total) by mouth at bedtime. 09/20/15  Yes Arnoldo Lenis, MD  lidocaine-prilocaine (EMLA) cream Apply 1 application topically as needed. For dialysis 10/06/15  Yes Historical Provider, MD  lisinopril  (PRINIVIL,ZESTRIL) 20 MG tablet Take 1 tablet (20 mg total) by mouth 2 (two) times daily. 11/10/15  Yes Richard Dondiego, MD  LUMIGAN 0.01 % SOLN Place 1 drop into both eyes at bedtime. 09/18/15  Yes Historical Provider, MD  multivitamin (RENA-VIT) TABS tablet Take 1 tablet by mouth daily.   Yes Historical Provider, MD  oxyCODONE (OXY IR/ROXICODONE) 5 MG immediate release tablet Take 1 tablet (5 mg total) by mouth every 6 (six) hours as needed for moderate pain or severe pain. 10/31/15  Yes Ivan Anchors Love, PA-C  pantoprazole (PROTONIX) 40 MG tablet Take 1 tablet (40 mg total) by mouth daily at 12 noon. 10/23/15  Yes Barton Dubois, MD  polyvinyl alcohol (LIQUIFILM TEARS) 1.4 % ophthalmic solution  Place 1 drop into both eyes as needed for dry eyes. 10/31/15  Yes Ivan Anchors Love, PA-C  sevelamer carbonate (RENVELA) 800 MG tablet Take 1,600-2,400 mg by mouth See admin instructions. Take 3 tablets (2400 mg) with meals and 2 tablets (1600 mg) with snacks   Yes Historical Provider, MD  SIMBRINZA 1-0.2 % SUSP Place 1 drop into both eyes 2 (two) times daily. 09/18/15  Yes Historical Provider, MD   BP 134/59 mmHg  Pulse 78  Resp 20  SpO2 95% Physical Exam  Constitutional: He is oriented to person, place, and time. He appears well-developed and well-nourished.  HENT:  Head: Normocephalic and atraumatic.  Right Ear: External ear normal.  Left Ear: External ear normal.  Nose: Nose normal.  Eyes: Right eye exhibits no discharge. Left eye exhibits no discharge.  Neck: Neck supple.  Cardiovascular: Normal rate, regular rhythm and intact distal pulses.   Murmur heard. Pulmonary/Chest: Effort normal and breath sounds normal. He has no wheezes. He has no rales.  Abdominal: Soft. There is no tenderness.  Musculoskeletal: He exhibits no edema.  BKA right, AKA left  Neurological: He is alert and oriented to person, place, and time.  Skin: Skin is warm and dry.  Nursing note and vitals reviewed.   ED Course   Procedures (including critical care time) Labs Review Labs Reviewed  BASIC METABOLIC PANEL - Abnormal; Notable for the following:    Chloride 95 (*)    Glucose, Bld 158 (*)    BUN 54 (*)    Creatinine, Ser 8.62 (*)    GFR calc non Af Amer 5 (*)    GFR calc Af Amer 6 (*)    All other components within normal limits  CBC WITH DIFFERENTIAL/PLATELET - Abnormal; Notable for the following:    RBC 3.91 (*)    Hemoglobin 11.0 (*)    HCT 37.5 (*)    MCHC 29.3 (*)    RDW 19.5 (*)    Lymphs Abs 0.6 (*)    All other components within normal limits  TROPONIN I - Abnormal; Notable for the following:    Troponin I 0.14 (*)    All other components within normal limits  TROPONIN I - Abnormal; Notable for the following:    Troponin I 0.33 (*)    All other components within normal limits    Imaging Review Dg Chest 2 View  12/17/2015  CLINICAL DATA:  This morning he started to have right sided chest pain, patient states he has the pain when his blood pressure gets high. HX HTN, CAD, Pneumonia, stroke EXAM: CHEST  2 VIEW COMPARISON:  11/08/2015 FINDINGS: Stable enlarged cardiac silhouette. LEFT basilar atelectasis effusion. No edema or infiltrate. No pneumothorax. IMPRESSION: LEFT basilar atelectasis and effusion. Electronically Signed   By: Suzy Bouchard M.D.   On: 12/17/2015 13:35   I have personally reviewed and evaluated these images and lab results as part of my medical decision-making.   EKG Interpretation   Date/Time:  Sunday December 17 2015 11:57:33 EDT Ventricular Rate:  82 PR Interval:  41 QRS Duration: 101 QT Interval:  414 QTC Calculation: 483 R Axis:   71 Text Interpretation:  Sinus rhythm Short PR interval LVH with secondary  repolarization abnormality Anterior Q waves, possibly due to LVH ST depr,  consider ischemia, inferior leads Baseline wander in lead(s) II III aVF no  significant change since Feb 2017 Confirmed by Regenia Skeeter  MD, Demiah Gullickson [phone])  on 12/17/2015 12:16:35 PM  MDM   Final diagnoses:  Elevated troponin    Patient with chest pain that sounds like his prior angina based on chart review. Patient has an elevated troponin that has doubled after a three-hour period. This is likely from hypertensive crisis that was transient. Patient has been pain-free for several hours. He will be admitted to the hospitalist for further monitoring and treatment. ECG is unchanged from prior. BP currently in 0000000, [id number] systolic.   Sherwood Gambler, MD 12/17/15 1719

## 2015-12-17 NOTE — ED Notes (Signed)
Pt arrives from home via [place]. EMS states they run this pt frequently rt htn and cp. Pt is pain free after receiving 1 nitro. Pt took 200mg  of ASA at home, EMS didn't give any more ASA. EMS reports the pt has 2 different med lists with multiple bags of bottles with different medications in the bottles and some bottles with no labels. Pt may benefit from a more comprehensive list and organization. Pt already receives home health.

## 2015-12-17 NOTE — H&P (Signed)
PATIENT DETAILS Name: Randy [name] Age: 82 y.o. Sex: male Date of Birth: 1931/10/05 Admit Date: 12/17/2015 [id number] M, MD Referring Physician: Dr. Regenia Skeeter   CHIEF COMPLAINT:  Chest pain  HPI: Randy [name] is a 82 y.o. male with a Past Medical History of ESRD on hemodialysis MWF, nonobstructive CAD, history of peripheral vascular disease status post bilateral lower extremity amputee (right BKA, left AKA), hypertension, chronic systolic heart failure who presented to the ED for evaluation of chest pain and high blood pressure. Per patient, earlier this morning, he had retrosternal chest pain that he describes as burning, nonradiating, not associated with shortness of breath (reminiscent of his usual stable angina was spent. Approximately 6-7/out of 10 at its intensity. During this time he claims his blood pressure was in the systolic A999333 range. He was subsequently brought to the ED for further evaluation and treatment. He claims that the chest pain completely resolved and around 20-30 minutes after he received a sublingual nitroglycerin tablet. He however was found to have elevated cardiac enzymes, and I was asked to admit this patient for further evaluation and treatment.  During my evaluation, patient was chest pain-free, blood pressure had improved significantly and was down to the A999333 systolic range.  Per patient, he does have chest pain once every 2 weeks or so, but claims that over the past few weeks he thinks the chest pain has gotten more frequent.  She denies any fever, headache, cough, nausea, vomiting, diarrhea or abdominal pain.  ALLERGIES:  No Known Allergies  PAST MEDICAL HISTORY: Past Medical History  Diagnosis Date  . Hypercholesterolemia   . Gout   . Nonischemic cardiomyopathy (HCC)     LVEF 35-40%  . Anemia of chronic disease   . Essential hypertension   . Arthritis   . Peripheral vascular disease ([place])     Status post right  below knee amputation for a nonhealing wound of the right foot 12/2014  . History of pneumonia 2014  . CAD (coronary artery disease)     Moderate multivessel disease 07/2015 - managed medically  . History of myopericarditis 2015    07/2015  . Stroke [facility])     "they said I did"  "I did not know anything about it"  . ESRD on hemodialysis [facility])     M/W/F in [place] - Dr. Florene Glen  . Constipation   . History of blood transfusion   . Pneumonia   . Acute myopericarditis 2015    PAST SURGICAL HISTORY: Past Surgical History  Procedure Laterality Date  . Knee arthroscopy Right 2007  . Back surgery    . Hemorrhoid surgery  1970's  . Ganglion cyst excision  01/03/2012    Procedure: REMOVAL GANGLION OF WRIST;  Surgeon: Scherry Ran, MD;  Location: [facility];  Service: General;  Laterality: Right;  . Colonoscopy    . Av fistula placement  08/24/2012    Procedure: ARTERIOVENOUS (AV) FISTULA CREATION;  Surgeon: Rosetta Posner, MD;  Location: [place];  Service: Vascular;  Laterality: Left;  . Insertion of dialysis catheter Right      neck  . Insertion of dialysis catheter  10/19/2012    Procedure: INSERTION OF DIALYSIS CATHETER;  Surgeon: Rosetta Posner, MD;  Location: [place];  Service: Vascular;  Laterality: N/A;  REMOVE TEMPORARY CATH  . Cholecystectomy    . Lumbar disc surgery  2004  . Left heart catheterization with coronary angiogram N/A 08/17/2014  Procedure: LEFT HEART CATHETERIZATION WITH CORONARY ANGIOGRAM;  Surgeon: Larey Dresser, MD;  Location: [facility] CATH LAB;  Service: Cardiovascular;  Laterality: N/A;  . Colonoscopy Left 09/26/2014    Procedure: COLONOSCOPY;  Surgeon: Arta Silence, MD;  Location: [facility] ENDOSCOPY;  Service: Endoscopy;  Laterality: Left;  . Amputation Right 01/05/2015    Procedure: AMPUTATION BELOW KNEE;  Surgeon: Angelia Mould, MD;  Location: [place];  Service: Vascular;  Laterality: Right;  . Capd removal N/A 03/13/2015    Procedure: CONTINUOUS AMBULATORY PERITONEAL  DIALYSIS  (CAPD) CATHETER REMOVAL;  Surgeon: Coralie Keens, MD;  Location: [place];  Service: General;  Laterality: N/A;  . Cardiac catheterization    . Above knee leg amputation Left 10/10/2015  . Amputation Left 10/10/2015    Procedure: AMPUTATION ABOVE KNEE LEFT;  Surgeon: Angelia Mould, MD;  Location: [place];  Service: Vascular;  Laterality: Left;    MEDICATIONS AT HOME: Prior to Admission medications   Medication Sig Start Date End Date Taking? Authorizing Provider  amLODipine (NORVASC) 10 MG tablet Take 0.5 tablets (5 mg total) by mouth daily. 10/31/15  Yes Ivan Anchors Love, PA-C  aspirin 81 MG tablet Take 81 mg by mouth daily.   Yes Historical Provider, MD  atorvastatin (LIPITOR) 80 MG tablet Take 1 tablet (80 mg total) by mouth daily. 11/02/15  Yes Arnoldo Lenis, MD  calcitRIOL (ROCALTROL) 0.5 MCG capsule Take 1 capsule (0.5 mcg total) by mouth daily. 11/10/15  Yes Lucia Gaskins, MD  carvedilol (COREG) 25 MG tablet Take 1 tablet (25 mg total) by mouth 2 (two) times daily with a meal. 10/31/15  Yes Ivan Anchors Love, PA-C  docusate sodium (COLACE) 100 MG capsule Take 2 capsules (200 mg total) by mouth daily. 10/31/15  Yes Ivan Anchors Love, PA-C  gabapentin (NEURONTIN) 100 MG capsule Take 1 capsule (100 mg total) by mouth at bedtime. Patient taking differently: Take 100 mg by mouth at bedtime as needed (for neuropathy).  10/23/15  Yes Barton Dubois, MD  hydrALAZINE (APRESOLINE) 50 MG tablet Take 1 tablet (50 mg total) by mouth every 8 (eight) hours. 11/10/15  Yes Lucia Gaskins, MD  isosorbide mononitrate (IMDUR) 30 MG 24 hr tablet Take 0.5 tablets (15 mg total) by mouth at bedtime. 09/20/15  Yes Arnoldo Lenis, MD  lidocaine-prilocaine (EMLA) cream Apply 1 application topically as needed. For dialysis 10/06/15  Yes Historical Provider, MD  lisinopril (PRINIVIL,ZESTRIL) 20 MG tablet Take 1 tablet (20 mg total) by mouth 2 (two) times daily. 11/10/15  Yes Richard Dondiego, MD  LUMIGAN 0.01 %  SOLN Place 1 drop into both eyes at bedtime. 09/18/15  Yes Historical Provider, MD  multivitamin (RENA-VIT) TABS tablet Take 1 tablet by mouth daily.   Yes Historical Provider, MD  oxyCODONE (OXY IR/ROXICODONE) 5 MG immediate release tablet Take 1 tablet (5 mg total) by mouth every 6 (six) hours as needed for moderate pain or severe pain. 10/31/15  Yes Ivan Anchors Love, PA-C  pantoprazole (PROTONIX) 40 MG tablet Take 1 tablet (40 mg total) by mouth daily at 12 noon. 10/23/15  Yes Barton Dubois, MD  polyvinyl alcohol (LIQUIFILM TEARS) 1.4 % ophthalmic solution Place 1 drop into both eyes as needed for dry eyes. 10/31/15  Yes Ivan Anchors Love, PA-C  sevelamer carbonate (RENVELA) 800 MG tablet Take 1,600-2,400 mg by mouth See admin instructions. Take 3 tablets (2400 mg) with meals and 2 tablets (1600 mg) with snacks   Yes Historical Provider, MD  SIMBRINZA 1-0.2 %  SUSP Place 1 drop into both eyes 2 (two) times daily. 09/18/15  Yes Historical Provider, MD    FAMILY HISTORY: Family History  Problem Relation Age of Onset  . Arthritis    . Cancer    . Kidney disease    . Anesthesia problems Neg Hx   . Hypotension Neg Hx   . Malignant hyperthermia Neg Hx   . Pseudochol deficiency Neg Hx   . Cancer Sister   . Colon cancer Brother   . Colon cancer Brother     SOCIAL HISTORY:  reports that he quit smoking about 17 years ago. His smoking use included Cigarettes. He has a 30 pack-year smoking history. He quit smokeless tobacco use about 21 years ago. His smokeless tobacco use included Chew. He reports that he does not drink alcohol or use illicit drugs. Lives at: Home Mobility: Wheelchair   REVIEW OF SYSTEMS:  Constitutional:   No  weight loss, night sweats,  Fevers, chills, fatigue.  HEENT:    No headaches, Dysphagia,Tooth/dental problems,Sore throat,   Cardio-vascular: No Orthopnea, PND,lower extremity edema, anasarca, palpitations  GI:  No heartburn, indigestion, abdominal pain, nausea, vomiting,  diarrhea, melena or hematochezia  Resp: No shortness of breath, cough, hemoptysis,plueritic chest pain.   Skin:  No rash or lesions.  GU:  No dysuria, change in color of urine, no urgency or frequency.  No flank pain.  Musculoskeletal: No joint pain or swelling.  No decreased range of motion.  No back pain.  Endocrine: No heat intolerance, no cold intolerance, no polyuria, no polydipsia  Psych: No change in mood or affect. No depression or anxiety.  No memory loss.   PHYSICAL EXAM: Blood pressure 166/82, pulse 82, resp. rate 18, SpO2 97 %.  General appearance :Awake, alert, not in any distress. Speech Clear. Not toxic Looking HEENT: Atraumatic and Normocephalic, pupils equally reactive to light and accomodation Neck: supple, no JVD. No cervical lymphadenopathy.  Chest:Good air entry bilaterally, no added sounds  CVS: S1 S2 regular, systolic murmur Abdomen: Bowel sounds present, Non tender and not distended with no gaurding, rigidity or rebound. Extremities: Right BKA, left AKA Neurology:  Non focal Skin:No Rash Wounds:N/A  LABS ON ADMISSION:   Recent Labs  12/17/15 1233  NA 136  K 4.8  CL 95*  CO2 26  GLUCOSE 158*  BUN 54*  CREATININE 8.62*  CALCIUM 9.5   No results for input(s): AST, ALT, ALKPHOS, BILITOT, PROT, ALBUMIN in the last 72 hours. No results for input(s): LIPASE, AMYLASE in the last 72 hours.  Recent Labs  12/17/15 1233  WBC 7.0  NEUTROABS 5.6  HGB 11.0*  HCT 37.5*  MCV 95.9  PLT 253    Recent Labs  12/17/15 1233 12/17/15 1545  TROPONINI 0.14* 0.33*   No results for input(s): DDIMER in the last 72 hours. Invalid input(s): POCBNP   RADIOLOGIC STUDIES ON ADMISSION: Dg Chest 2 View  12/17/2015  CLINICAL DATA:  This morning he started to have right sided chest pain, patient states he has the pain when his blood pressure gets high. HX HTN, CAD, Pneumonia, stroke EXAM: CHEST  2 VIEW COMPARISON:  11/08/2015 FINDINGS: Stable enlarged  cardiac silhouette. LEFT basilar atelectasis effusion. No edema or infiltrate. No pneumothorax. IMPRESSION: LEFT basilar atelectasis and effusion. Electronically Signed   By: Suzy Bouchard M.D.   On: 12/17/2015 13:35    I have personally reviewed images of chest xray    EKG: Personally reviewed. Normal sinus rhythm plus PVCs  ASSESSMENT  AND PLAN: Present on Admission:  . Chest pain: Although atypical, patient has a history of  CAD and also claims that pain is very typical of his stable anginal episodes. Troponins minimally elevated, but trending upward. However currently chest pain-free and looks very comfortable. Will admit to telemetry, continue aspirin, beta blocker and statins. For now will hold off on starting IV heparin-if troponins continue to trend upward, we will need to start IV heparin. Have consulted cardiology who will consult and follow.   Marland Kitchen ESRD: On hemodialysis MWF. Nephrology consulted for dialysis tomorrow.   . Anemia of chronic disease: Continue IV iron/Aranesp per nephrology.   Marland Kitchen CAD (coronary artery disease): As above-continue aspirin, statin and Coreg.   . Essential hypertension: Blood pressure apparently fluctuates, currently reasonably well-controlled. Continue usual antihypertensives. Follow.  . Chronic systolic heart failure: Clinically compensated, was a removal during hemodialysis. Last EF by TTE November 2016 30-35%.  . Aortic stenosis: Cards following as outpatient  . Bilateral lower extremity amputee  Further plan will depend as patient's clinical course evolves and further radiologic and laboratory data become available. Patient will be monitored closely.  Above noted plan was discussed with patient/daughter/sister face to face at bedside, they were in agreement.   CONSULTS: Cards  DVT Prophylaxis: Prophylactic Heparin  Code Status: Full Code  Disposition Plan:  Discharge back home possibly in 1-2 days,but may warrant SNF depending on clinical  course  Total time spent  45 minutes.Greater than 50% of this time was spent in counseling, explanation of diagnosis, planning of further management, and coordination of care.  [facility] Pager [phone]  If 7PM-7AM, please contact night-coverage [email or web address] Password [facility] 12/17/2015, 5:51 PM

## 2015-12-17 NOTE — Consult Note (Addendum)
Patient ID: Randy [name] MRN: [id number] DOB/AGE: 1931-10-11 82 y.o.  Admit date: 12/17/2015 Primary Physician DONDIEGO,Randy [name] Randy Mages, MD  Primary Cardiologist Carlyle Dolly, MD Requesting Physician: Randy [name]   Chief Complaint  Chest pain   HPI: Randy [name] is an 82 year old man with end-stage renal disease on hemodialysis, chronic systolic and diastolic heart failure LVEF 30-35%, hypertensive heart disease, coronary artery disease being managed medically, hyperlipidemia,  Mild to moderate aortic stenosis, atrial fibrillation off anticoagulation,and peripheral arterial disease here with chest pain.  This AM Randy [name] noted moderate, left-sided chest pain that did not radiate.  It was not associated with shortness of breath, nausea or diaphoresis.  At the time his SBP was 230 mmHg.  He notes that his BP is typically high in the AM and then improves throughout the day.  He has been taking all medications as prescribed.  Las HD session was Friday and he had a full session.   Randy [name] has been admitted  several times with chest pain and mildly elevated troponin. This typically occurs in the setting of volume overload or hypertensive urgency. His symptoms have resolved with blood pressure control in the past. He does have significant coronary artery disease. Cardiac catheterization 07/2014 revealed 40-50% proximal LAD, 40% ostial D1, 80% RI, and 40% RCA.  He was medically managed at the time.   Review of Systems: A 12 point review of systems was obtained and was negative with exceptions as noted in the HPI.  Past Medical History  Diagnosis Date  . Hypercholesterolemia   . Gout   . Nonischemic cardiomyopathy (HCC)     LVEF 35-40%  . Anemia of chronic disease   . Essential hypertension   . Arthritis   . Peripheral vascular disease ([place])     Status post right below knee amputation for a nonhealing wound of the right foot 12/2014  . History of pneumonia 2014  . CAD (coronary artery  disease)     Moderate multivessel disease 07/2015 - managed medically  . History of myopericarditis 2015    07/2015  . Stroke [facility])     "they said I did"  "I did not know anything about it"  . ESRD on hemodialysis [facility])     M/W/F in [place] - Dr. Florene Glen  . Constipation   . History of blood transfusion   . Pneumonia   . Acute myopericarditis 2015     (Not in a hospital admission)   Infusions:   No Known Allergies  Social History   Social History  . Marital Status: Married    Spouse Name: N/A  . Number of Children: N/A  . Years of Education: 10   Occupational History  .     Social History Main Topics  . Smoking status: Former Smoker -- 1.00 packs/day for 30 years    Types: Cigarettes    Quit date: 08/19/1998  . Smokeless tobacco: Former Systems developer    Types: Chew    Quit date: 12/31/1993  . Alcohol Use: No  . Drug Use: No  . Sexual Activity: No   Other Topics Concern  . Not on file   Social History Narrative    Family History  Problem Relation Age of Onset  . Arthritis    . Cancer    . Kidney disease    . Anesthesia problems Neg Hx   . Hypotension Neg Hx   . Malignant hyperthermia Neg Hx   . Pseudochol deficiency Neg Hx   .  Cancer Sister   . Colon cancer Brother   . Colon cancer Brother     PHYSICAL EXAM: Filed Vitals:   12/17/15 1730 12/17/15 1751  BP: 166/82 173/86  Pulse: 82 87  Resp: 18 20    No intake or output data in the 24 hours ending 12/17/15 1753  General:  Chronically ill-appearing. No respiratory difficulty HEENT: normal Neck: supple. no JVD. Carotids 2+ bilat; no bruits. No lymphadenopathy or thryomegaly appreciated. Cor: PMI nondisplaced. Regular rate & rhythm. No rubs, gallops or murmurs. Lungs: clear Abdomen: soft, nontender, nondistended. No hepatosplenomegaly. No bruits or masses. Good bowel sounds. Extremities: no cyanosis, clubbing, rash.  Bilateral AKA.  No edema. Neuro: alert & oriented x 3, cranial nerves grossly  intact. moves all 4 extremities w/o difficulty. Affect pleasant.  Results for orders placed or performed during the hospital encounter of 12/17/15 (from the past 24 hour(s))  Basic metabolic panel     Status: Abnormal   Collection Time: 12/17/15 12:33 PM  Result Value Ref Range   Sodium 136 135 - 145 mmol/L   Potassium 4.8 3.5 - 5.1 mmol/L   Chloride 95 (L) 101 - 111 mmol/L   CO2 26 22 - 32 mmol/L   Glucose, Bld 158 (H) 65 - 99 mg/dL   BUN 54 (H) 6 - 20 mg/dL   Creatinine, Ser 8.62 (H) 0.61 - 1.24 mg/dL   Calcium 9.5 8.9 - 10.3 mg/dL   GFR calc non Af Amer 5 (L) >60 mL/min   GFR calc Af Amer 6 (L) >60 mL/min   Anion gap 15 5 - 15  CBC with Differential     Status: Abnormal   Collection Time: 12/17/15 12:33 PM  Result Value Ref Range   WBC 7.0 4.0 - 10.5 K/uL   RBC 3.91 (L) 4.22 - 5.81 MIL/uL   Hemoglobin 11.0 (L) 13.0 - 17.0 g/dL   HCT 37.5 (L) 39.0 - 52.0 %   MCV 95.9 78.0 - 100.0 fL   MCH 28.1 26.0 - 34.0 pg   MCHC 29.3 (L) 30.0 - 36.0 g/dL   RDW 19.5 (H) 11.5 - 15.5 %   Platelets 253 150 - 400 K/uL   Neutrophils Relative % 80 %   Neutro Abs 5.6 1.7 - 7.7 K/uL   Lymphocytes Relative 9 %   Lymphs Abs 0.6 (L) 0.7 - 4.0 K/uL   Monocytes Relative 9 %   Monocytes Absolute 0.6 0.1 - 1.0 K/uL   Eosinophils Relative 2 %   Eosinophils Absolute 0.2 0.0 - 0.7 K/uL   Basophils Relative 0 %   Basophils Absolute 0.0 0.0 - 0.1 K/uL  Troponin I     Status: Abnormal   Collection Time: 12/17/15 12:33 PM  Result Value Ref Range   Troponin I 0.14 (H) <0.031 ng/mL  Troponin I     Status: Abnormal   Collection Time: 12/17/15  3:45 PM  Result Value Ref Range   Troponin I 0.33 (H) <0.031 ng/mL   Dg Chest 2 View  12/17/2015  CLINICAL DATA:  This morning he started to have right sided chest pain, patient states he has the pain when his blood pressure gets high. HX HTN, CAD, Pneumonia, stroke EXAM: CHEST  2 VIEW COMPARISON:  11/08/2015 FINDINGS: Stable enlarged cardiac silhouette. LEFT  basilar atelectasis effusion. No edema or infiltrate. No pneumothorax. IMPRESSION: LEFT basilar atelectasis and effusion. Electronically Signed   By: Suzy Bouchard M.D.   On: 12/17/2015 13:35    ECG: Sinus arrhythmia. Rate  82 bpm. Left ventricular hypertrophy with repolarization abnormality.  Echo 08/04/15: Study Conclusions  - Left ventricle: The cavity size was normal. Wall thickness was  increased increased in a pattern of mild to moderate LVH.  Incidentally noted transverse false tendon in LV. Systolic  function was moderately to severely reduced. The estimated  ejection fraction was in the range of 30% to 35%. Diffuse  hypokinesis. There is severe hypokinesis of the  basal-midinferolateral and inferior myocardium. Doppler  parameters are consistent with restrictive physiology, indicative  of decreased left ventricular diastolic compliance and/or  increased left atrial pressure. - Aortic valve: Moderately calcified annulus. Trileaflet; mildly  calcified leaflets. Cusp separation was reduced. There was  moderate stenosis. There was trivial regurgitation. Mean gradient  (S): 16 mm Hg. VTI ratio of LVOT to aortic valve: 0.4. Valve area  (VTI): 1.24 cm^2. Valve area (Vmax): 1.21 cm^2. - Mitral valve: Calcified annulus. There was trivial regurgitation. - Left atrium: The atrium was moderately dilated. - Right atrium: The atrium was moderately to severely dilated.  Central venous pressure (est): 3 mm Hg. - Atrial septum: No defect or patent foramen ovale was identified. - Tricuspid valve: There was mild regurgitation. - Pulmonary arteries: PA peak pressure: 38 mm Hg (S). - Pericardium, extracardiac: There was no pericardial effusion.  Impressions:  - Mild to moderate LVH with LVEF approximately 30%, diffuse  hypokinesis, most severe in the mid to basal inferior and  inferolateral wall. Diastolic filling pattern consistent with  restrictive physiology.  Moderate left atrial enlargement.  Moderate calcific aortic stenosis with trivial aortic  regurgitation. Mild tricuspid regurgitation with PASP 38 mmHg.  Moderate to severe right atrial enlargement. Compared to the  previous study from December 2015, there has been further  reduction in LVEF, and progression in degree of aortic stenosis.  ASSESSMENT/PLAN:   # Hypertensive urgency: Blood pressure remains elevated but now better controlled.  We will increase hydralazine to 75mg  q8h.  He may need to do this on non-HD days and continue with 50 mg on HD days.  Continue amlodipine 5 mg, carvedilol 25mg  bid and Imdur 15mg .  We will switch lisinopril to 40 mg qhs from 20 mg bid given that his morning blood pressures typically run high and improve throughout the day.    # Demand ischemia, chronically elevated troponin:  # Chest pain # CAD Troponin is mildly elevated.  Mr. Monfils has known CAD that is being managed medically.  He only has chest pain when his BP is very elevated.  Will manage blood pressure as above.  He is currently chest pain free and denies chest pain with exertion.  If he develops exertional symptoms with adequate BP control, consider further ischemia evaluation.  For now, continue medical management.  Continue aspirin, atorvastatin, carvedilol, lisiniopril, Imdur and amlodipine.    Signed: Taurus Willis C. Oval Linsey, MD, [facility]  12/17/2015, 5:53 PM

## 2015-12-18 DIAGNOSIS — I132 Hypertensive heart and chronic kidney disease with heart failure and with stage 5 chronic kidney disease, or end stage renal disease: Secondary | ICD-10-CM | POA: Diagnosis present

## 2015-12-18 DIAGNOSIS — Z7982 Long term (current) use of aspirin: Secondary | ICD-10-CM | POA: Diagnosis not present

## 2015-12-18 DIAGNOSIS — I5042 Chronic combined systolic (congestive) and diastolic (congestive) heart failure: Secondary | ICD-10-CM

## 2015-12-18 DIAGNOSIS — I169 Hypertensive crisis, unspecified: Secondary | ICD-10-CM | POA: Diagnosis present

## 2015-12-18 DIAGNOSIS — R7989 Other specified abnormal findings of blood chemistry: Secondary | ICD-10-CM | POA: Diagnosis not present

## 2015-12-18 DIAGNOSIS — I350 Nonrheumatic aortic (valve) stenosis: Secondary | ICD-10-CM | POA: Diagnosis present

## 2015-12-18 DIAGNOSIS — R072 Precordial pain: Secondary | ICD-10-CM | POA: Diagnosis not present

## 2015-12-18 DIAGNOSIS — N186 End stage renal disease: Secondary | ICD-10-CM

## 2015-12-18 DIAGNOSIS — N2581 Secondary hyperparathyroidism of renal origin: Secondary | ICD-10-CM | POA: Diagnosis present

## 2015-12-18 DIAGNOSIS — Z89612 Acquired absence of left leg above knee: Secondary | ICD-10-CM | POA: Diagnosis not present

## 2015-12-18 DIAGNOSIS — R079 Chest pain, unspecified: Secondary | ICD-10-CM | POA: Diagnosis present

## 2015-12-18 DIAGNOSIS — I2510 Atherosclerotic heart disease of native coronary artery without angina pectoris: Secondary | ICD-10-CM | POA: Diagnosis present

## 2015-12-18 DIAGNOSIS — I160 Hypertensive urgency: Secondary | ICD-10-CM | POA: Diagnosis present

## 2015-12-18 DIAGNOSIS — Z992 Dependence on renal dialysis: Secondary | ICD-10-CM

## 2015-12-18 DIAGNOSIS — Z8673 Personal history of transient ischemic attack (TIA), and cerebral infarction without residual deficits: Secondary | ICD-10-CM | POA: Diagnosis not present

## 2015-12-18 DIAGNOSIS — Z89511 Acquired absence of right leg below knee: Secondary | ICD-10-CM | POA: Diagnosis not present

## 2015-12-18 DIAGNOSIS — I429 Cardiomyopathy, unspecified: Secondary | ICD-10-CM | POA: Diagnosis present

## 2015-12-18 DIAGNOSIS — I248 Other forms of acute ischemic heart disease: Secondary | ICD-10-CM | POA: Diagnosis present

## 2015-12-18 DIAGNOSIS — R778 Other specified abnormalities of plasma proteins: Secondary | ICD-10-CM | POA: Insufficient documentation

## 2015-12-18 DIAGNOSIS — D638 Anemia in other chronic diseases classified elsewhere: Secondary | ICD-10-CM | POA: Diagnosis present

## 2015-12-18 DIAGNOSIS — I739 Peripheral vascular disease, unspecified: Secondary | ICD-10-CM | POA: Diagnosis present

## 2015-12-18 DIAGNOSIS — E785 Hyperlipidemia, unspecified: Secondary | ICD-10-CM | POA: Diagnosis present

## 2015-12-18 LAB — RENAL FUNCTION PANEL
Albumin: 2.6 g/dL — ABNORMAL LOW (ref 3.5–5.0)
Anion gap: 16 — ABNORMAL HIGH (ref 5–15)
BUN: 76 mg/dL — ABNORMAL HIGH (ref 6–20)
CO2: 25 mmol/L (ref 22–32)
Calcium: 9.4 mg/dL (ref 8.9–10.3)
Chloride: 96 mmol/L — ABNORMAL LOW (ref 101–111)
Creatinine, Ser: 11.12 mg/dL — ABNORMAL HIGH (ref 0.61–1.24)
GFR calc Af Amer: 4 mL/min — ABNORMAL LOW (ref >60)
GFR calc non Af Amer: 4 mL/min — ABNORMAL LOW (ref >60)
Glucose, Bld: 110 mg/dL — ABNORMAL HIGH (ref 65–99)
Phosphorus: 8.7 mg/dL — ABNORMAL HIGH (ref 2.5–4.6)
Potassium: 5.3 mmol/L — ABNORMAL HIGH (ref 3.5–5.1)
Sodium: 137 mmol/L (ref 135–145)

## 2015-12-18 LAB — CBC
HCT: 35.2 % — ABNORMAL LOW (ref 39.0–52.0)
Hemoglobin: 10.9 g/dL — ABNORMAL LOW (ref 13.0–17.0)
MCH: 29 pg (ref 26.0–34.0)
MCHC: 31 g/dL (ref 30.0–36.0)
MCV: 93.6 fL (ref 78.0–100.0)
Platelets: 264 10*3/uL (ref 150–400)
RBC: 3.76 MIL/uL — ABNORMAL LOW (ref 4.22–5.81)
RDW: 19.7 % — ABNORMAL HIGH (ref 11.5–15.5)
WBC: 7 10*3/uL (ref 4.0–10.5)

## 2015-12-18 LAB — TROPONIN I
TROPONIN I: 0.82 ng/mL — AB (ref <0.031)
Troponin I: 0.96 ng/mL (ref <0.031)
Troponin I: 1.01 ng/mL (ref <0.031)

## 2015-12-18 LAB — MRSA PCR SCREENING: MRSA by PCR: NEGATIVE

## 2015-12-18 LAB — HEPARIN LEVEL (UNFRACTIONATED): Heparin Unfractionated: <0.1 IU/mL — ABNORMAL LOW (ref 0.30–0.70)

## 2015-12-18 MED ORDER — HEPARIN BOLUS VIA INFUSION
2000.0000 [IU] | Freq: Once | INTRAVENOUS | Status: AC
  Administered 2015-12-18: 2000 [IU] via INTRAVENOUS
  Filled 2015-12-18: qty 2000

## 2015-12-18 MED ORDER — SODIUM CHLORIDE 0.9 % IV SOLN: 100.0000 mL | INTRAVENOUS | Status: DC | PRN

## 2015-12-18 MED ORDER — PENTAFLUOROPROP-TETRAFLUOROETH EX AERO: 1.0000 "application " | INHALATION_SPRAY | CUTANEOUS | Status: DC | PRN

## 2015-12-18 MED ORDER — HEPARIN (PORCINE) IN NACL 100-0.45 UNIT/ML-% IJ SOLN
1350.0000 [IU]/h | INTRAMUSCULAR | Status: AC
  Administered 2015-12-18: 800 [IU]/h via INTRAVENOUS
  Administered 2015-12-20: 1350 [IU]/h via INTRAVENOUS
  Filled 2015-12-18 (×3): qty 250

## 2015-12-18 MED ORDER — LIDOCAINE-PRILOCAINE 2.5-2.5 % EX CREA: 1.0000 "application " | TOPICAL_CREAM | CUTANEOUS | Status: DC | PRN

## 2015-12-18 MED ORDER — LIDOCAINE HCL (PF) 1 % IJ SOLN: 5.0000 mL | INTRAMUSCULAR | Status: DC | PRN

## 2015-12-18 NOTE — Progress Notes (Signed)
ANTICOAGULATION CONSULT NOTE - Follow-up Consult  Pharmacy Consult for heparin Indication: chest pain/ACS  No Known Allergies  Patient Measurements: Weight: 125 lb 14.1 oz (57.1 kg)  Vital Signs: Temp: 99.3 F (37.4 C) (03/27 1930) Temp Source: Oral (03/27 1930) BP: 129/72 mmHg (03/27 2247) Pulse Rate: 70 (03/27 1930)  Labs:  Recent Labs  12/17/15 1233  12/17/15 1949 12/17/15 2212 12/18/15 0111 12/18/15 0956 12/18/15 1232 12/18/15 2211  HGB 11.0*  --  10.5*  --   --   --  10.9*  --   HCT 37.5*  --  36.3*  --   --   --  35.2*  --   PLT 253  --  255  --   --   --  264  --   HEPARINUNFRC  --   --   --   --   --   --   --  <0.10*  CREATININE 8.62*  --  9.21*  --   --   --  11.12*  --   TROPONINI 0.14*  < > 0.87* 0.96* 1.01* 0.82*  --   --   < > = values in this interval not displayed.  Estimated Creatinine Clearance: 3.6 mL/min (by C-G formula based on Cr of 11.12).  Assessment: 82yo male on heparin for elevated trop. Now trending down. Per Cards noted, plan for heparin for 48 hours. Heparin level undetectable on gtt at 800 units/hr. CBC stable. No issues with line or bleeding reported per RN.  Goal of Therapy:  Heparin level 0.3-0.7 units/ml Monitor platelets by anticoagulation protocol: Yes   Plan:  Rebolus heparin 2000 units and increase gtt to 1050 units/hr Will f/u 8 hr heparin level  Sherlon Handing, PharmD, BCPS Clinical pharmacist, pager [phone] 12/18/2015,11:30 PM

## 2015-12-18 NOTE — Progress Notes (Signed)
PROGRESS NOTE  Randy [name] [id number] DOB: 1932/06/27 DOA: 12/17/2015 PCP: [name], MD Outpatient Specialists:    LOS: 1 day   Brief Narrative: 82 y.o. male with a Past Medical History of ESRD on hemodialysis MWF, nonobstructive CAD, history of peripheral vascular disease status post bilateral lower extremity amputee (right BKA, left AKA), hypertension, chronic systolic heart failure who presented to the ED for evaluation of chest pain and high blood pressure  Assessment & Plan: Principal Problem:   Chest pain Active Problems:   Anemia of chronic disease   ESRD (end stage renal disease) on dialysis ([place])   CAD (coronary artery disease)   S/P BKA (below knee amputation) unilateral ([place])   Status post above knee amputation of left lower extremity (HCC)   Chronic combined systolic and diastolic congestive heart failure (HCC)   Essential hypertension   S/P AKA (above knee amputation) unilateral (HCC)   Chest pain  - Likely in the setting of poorly controlled hypertension and hypertensive urgency, chest pain is resolved with improvement in his blood pressure - His troponin has been progressively climbing now greater than 1, he was started on IV heparin 3/27 am.  - Cardiology following and recommended continue IV heparin for at least 48 hours, no further invasive testing unless significant events  ESRD  - On hemodialysis MWF.  - Nephrology consulted, discussed with Dr. Marval Regal today   Anemia of chronic disease  - Continue IV iron/Aranesp per nephrology.   CAD (coronary artery disease) - As above-continue aspirin, statin and Coreg.  - cardiology seeing  Essential hypertension - Blood pressure apparently fluctuates, currently reasonably well-controlled. Continue usual antihypertensives. Follow.  Chronic systolic heart failure - Clinically compensated, was a removal during hemodialysis. Last EF by TTE November 2016 30-35%.  Aortic stenosis  Bilateral  lower extremity amputee   DVT prophylaxis: heparin infusion Code Status: Full Family Communication: no family bedside Disposition Plan: home when ready  Barriers for discharge: IV heparin  Consultants:   Nephrology   Cardiology   Procedures:   None   Antimicrobials:  None    Subjective: - No chest pain this morning, denies any palpitations, no abdominal pain, nausea, vomiting or diarrhea  Objective: Filed Vitals:   12/18/15 1212 12/18/15 1215 12/18/15 1227 12/18/15 1240  BP: 139/73 146/76 152/55 119/40  Pulse: 81 78 77   Temp: 98 F (36.7 C)     TempSrc: Oral     Resp: 20     Weight: 59.8 kg (131 lb 13.4 oz)     SpO2: 95%       Intake/Output Summary (Last 24 hours) at 12/18/15 1241 Last data filed at 12/17/15 2100  Gross per 24 hour  Intake    120 ml  Output      0 ml  Net    120 ml   Filed Weights   12/17/15 1930 12/18/15 0400 12/18/15 1212  Weight: 59.3 kg (130 lb 11.7 oz) 59.6 kg (131 lb 6.3 oz) 59.8 kg (131 lb 13.4 oz)    Examination: Constitutional: NAD, calm, comfortable Filed Vitals:   12/18/15 1212 12/18/15 1215 12/18/15 1227 12/18/15 1240  BP: 139/73 146/76 152/55 119/40  Pulse: 81 78 77   Temp: 98 F (36.7 C)     TempSrc: Oral     Resp: 20     Weight: 59.8 kg (131 lb 13.4 oz)     SpO2: 95%      Eyes: PERRL, lids and conjunctivae normal ENMT: Mucous membranes are moist.  Respiratory: clear to auscultation bilaterally, no wheezing, no crackles. Normal respiratory effort. No accessory muscle use.  Cardiovascular: Regular rate and rhythm, + murmur. No extremity edema. 2+ pedal pulses.  Abdomen: no tenderness, no masses palpated. Musculoskeletal: no clubbing / cyanosis. Bilateral amputee. Skin: no rashes, lesions, ulcers. No induration Neurologic: non focal. Bilateral LE amputee Psychiatric: Normal judgment and insight. Alert and oriented x 3. Normal mood.    Data Reviewed: I have personally reviewed following labs and imaging  studies  CBC:  Recent Labs Lab 12/17/15 1233 12/17/15 1949 12/18/15 1232  WBC 7.0 6.1 7.0  NEUTROABS 5.6  --   --   HGB 11.0* 10.5* 10.9*  HCT 37.5* 36.3* 35.2*  MCV 95.9 94.3 93.6  PLT 253 255 [id number]   Basic Metabolic Panel:  Recent Labs Lab 12/17/15 1233 12/17/15 1949  NA 136  --   K 4.8  --   CL 95*  --   CO2 26  --   GLUCOSE 158*  --   BUN 54*  --   CREATININE 8.62* 9.21*  CALCIUM 9.5  --    Cardiac Enzymes:  Recent Labs Lab 12/17/15 1545 12/17/15 1949 12/17/15 2212 12/18/15 0111 12/18/15 0956  TROPONINI 0.33* 0.87* 0.96* 1.01* 0.82*   Urine analysis:    Component Value Date/Time   COLORURINE YELLOW 06/22/2014 [place] 06/22/2014 0531   LABSPEC 1.015 06/22/2014 0531   PHURINE 6.0 06/22/2014 0531   GLUCOSEU 100* 06/22/2014 0531   HGBUR SMALL* 06/22/2014 0531   HGBUR trace-lysed 06/03/2008 0855   BILIRUBINUR NEGATIVE 06/22/2014 0531   KETONESUR TRACE* 06/22/2014 0531   PROTEINUR 100* 06/22/2014 0531   UROBILINOGEN 0.2 06/22/2014 0531   NITRITE NEGATIVE 06/22/2014 0531   LEUKOCYTESUR NEGATIVE 06/22/2014 0531   Radiology Studies: Dg Chest 2 View  12/17/2015  CLINICAL DATA:  This morning he started to have right sided chest pain, patient states he has the pain when his blood pressure gets high. HX HTN, CAD, Pneumonia, stroke EXAM: CHEST  2 VIEW COMPARISON:  11/08/2015 FINDINGS: Stable enlarged cardiac silhouette. LEFT basilar atelectasis effusion. No edema or infiltrate. No pneumothorax. IMPRESSION: LEFT basilar atelectasis and effusion. Electronically Signed   By: Suzy Bouchard M.D.   On: 12/17/2015 13:35   Scheduled Meds: . amLODipine  5 mg Oral Daily  . aspirin EC  324 mg Oral Daily  . atorvastatin  80 mg Oral Daily  . calcitRIOL  0.5 mcg Oral Daily  . carvedilol  25 mg Oral BID WC  . docusate sodium  200 mg Oral Daily  . gabapentin  100 mg Oral QHS  . hydrALAZINE  75 mg Oral 3 times per day  . isosorbide mononitrate  15 mg  Oral QHS  . latanoprost  1 drop Both Eyes QHS  . lisinopril  40 mg Oral QHS  . multivitamin  1 tablet Oral Daily  . [START ON 12/19/2015] pantoprazole  40 mg Oral Q1200  . sevelamer carbonate  2,400 mg Oral TID WC   Continuous Infusions: . heparin 800 Units/hr (12/18/15 0618)    Marzetta Board, MD, PhD [facility] Pager [phone]  If 7PM-7AM, please contact night-coverage [email or web address] Password [facility] 12/18/2015, 12:41 PM

## 2015-12-18 NOTE — Progress Notes (Signed)
Hemodialysis- Tx completed without issue. Goal of 3L met. Pt has no complaints, Report called to primary RN and pt was transferred back to room via transport staff.

## 2015-12-18 NOTE — Procedures (Signed)
Patient was seen on dialysis and the procedure was supervised. BFR 400 Via LAVF BP is 135/48.  Patient appears to be tolerating treatment well.

## 2015-12-18 NOTE — Progress Notes (Signed)
Hospital Problem List     Principal Problem:   Chest pain Active Problems:   Anemia of chronic disease   ESRD (end stage renal disease) on dialysis (HCC)   CAD (coronary artery disease)   S/P BKA (below knee amputation) unilateral (HCC)   Status post above knee amputation of left lower extremity (HCC)   Essential hypertension   S/P AKA (above knee amputation) unilateral ([place])    Patient Profile:   Primary Cardiologist: Dr. Harl Bowie  82 yo male w/ PMH of chronic combined systolic and diastolic CHF (EF 99991111), ESRD (HD- MWF), moderate CAD (by cath in 2015 with most significant being 80% stenosis in Ramus - Medically Managed), HTN, and HLD who presented with atypical chest pain on 12/17/2015 with a reported SBP in the 230's at that time.  Subjective   Denies any repeat episodes of chest pain. Says his breathing is at baseline. Denies any palpitations. SBP improved to the 140's/ 70's at the time of this encounter.   Inpatient Medications    . amLODipine  5 mg Oral Daily  . aspirin EC  324 mg Oral Daily  . atorvastatin  80 mg Oral Daily  . calcitRIOL  0.5 mcg Oral Daily  . carvedilol  25 mg Oral BID WC  . docusate sodium  200 mg Oral Daily  . gabapentin  100 mg Oral QHS  . hydrALAZINE  75 mg Oral 3 times per day  . isosorbide mononitrate  15 mg Oral QHS  . latanoprost  1 drop Both Eyes QHS  . lisinopril  40 mg Oral QHS  . multivitamin  1 tablet Oral Daily  . [START ON 12/19/2015] pantoprazole  40 mg Oral Q1200  . sevelamer carbonate  2,400 mg Oral TID WC    Vital Signs    Filed Vitals:   12/17/15 2242 12/18/15 0000 12/18/15 0400 12/18/15 0730  BP: 163/80 142/76 152/78   Pulse: 85 84 84   Temp:  98.6 F (37 C) 98.5 F (36.9 C) 98.8 F (37.1 C)  TempSrc:  Oral Oral Oral  Resp: 18 16 15    Weight:   131 lb 6.3 oz (59.6 kg)   SpO2: 95% 95% 95%     Intake/Output Summary (Last 24 hours) at 12/18/15 0859 Last data filed at 12/17/15 2100  Gross per 24 hour  Intake     120 ml  Output      0 ml  Net    120 ml   Filed Weights   12/17/15 1930 12/18/15 0400  Weight: 130 lb 11.7 oz (59.3 kg) 131 lb 6.3 oz (59.6 kg)    Physical Exam    General: Elderly African American  male appearing in no acute distress. Head: Normocephalic, atraumatic.  Neck: Supple without bruits, JVD not elevated. Lungs:  Resp regular and unlabored, CTA without wheezing or rales. Heart: RRR, S1, S2, no S3, S4, 2/6 SEM at RUSB; no rub. Abdomen: Soft, non-tender, non-distended with normoactive bowel sounds. No hepatomegaly. No rebound/guarding. No obvious abdominal masses. Extremities: No clubbing, cyanosis, or edema. BKA on right. AKA on left. Neuro: Alert and oriented X 3. Moves all extremities spontaneously. Psych: Normal affect.  Labs    CBC  Recent Labs  12/17/15 1233 12/17/15 1949  WBC 7.0 6.1  NEUTROABS 5.6  --   HGB 11.0* 10.5*  HCT 37.5* 36.3*  MCV 95.9 94.3  PLT 253 [id number]   Basic Metabolic Panel  Recent Labs  12/17/15 1233 12/17/15 1949  NA 136  --  K 4.8  --   CL 95*  --   CO2 26  --   GLUCOSE 158*  --   BUN 54*  --   CREATININE 8.62* 9.21*  CALCIUM 9.5  --    Liver Function Tests No results for input(s): AST, ALT, ALKPHOS, BILITOT, PROT, ALBUMIN in the last 72 hours. No results for input(s): LIPASE, AMYLASE in the last 72 hours. Cardiac Enzymes  Recent Labs  12/17/15 1949 12/17/15 2212 12/18/15 0111  TROPONINI 0.87* 0.96* 1.01*     Telemetry    NSR, HR in 70's - 80's Frequent missed beats with resulting pauses of 1.3 seconds.  ECG    No new tracings.   Cardiac Studies and Radiology    Dg Chest 2 View: 12/17/2015  CLINICAL DATA:  This morning he started to have right sided chest pain, patient states he has the pain when his blood pressure gets high. HX HTN, CAD, Pneumonia, stroke EXAM: CHEST  2 VIEW COMPARISON:  11/08/2015 FINDINGS: Stable enlarged cardiac silhouette. LEFT basilar atelectasis effusion. No edema or infiltrate. No  pneumothorax. IMPRESSION: LEFT basilar atelectasis and effusion. Electronically Signed   By: Suzy Bouchard M.D.   On: 12/17/2015 13:35    Echocardiogram: 07/2015 Study Conclusions - Left ventricle: The cavity size was normal. Wall thickness was  increased increased in a pattern of mild to moderate LVH.  Incidentally noted transverse false tendon in LV. Systolic  function was moderately to severely reduced. The estimated  ejection fraction was in the range of 30% to 35%. Diffuse  hypokinesis. There is severe hypokinesis of the  basal-midinferolateral and inferior myocardium. Doppler  parameters are consistent with restrictive physiology, indicative  of decreased left ventricular diastolic compliance and/or  increased left atrial pressure. - Aortic valve: Moderately calcified annulus. Trileaflet; mildly  calcified leaflets. Cusp separation was reduced. There was  moderate stenosis. There was trivial regurgitation. Mean gradient  (S): 16 mm Hg. VTI ratio of LVOT to aortic valve: 0.4. Valve area  (VTI): 1.24 cm^2. Valve area (Vmax): 1.21 cm^2. - Mitral valve: Calcified annulus. There was trivial regurgitation. - Left atrium: The atrium was moderately dilated. - Right atrium: The atrium was moderately to severely dilated.  Central venous pressure (est): 3 mm Hg. - Atrial septum: No defect or patent foramen ovale was identified. - Tricuspid valve: There was mild regurgitation. - Pulmonary arteries: PA peak pressure: 38 mm Hg (S). - Pericardium, extracardiac: There was no pericardial effusion.  Impressions: - Mild to moderate LVH with LVEF approximately 30%, diffuse  hypokinesis, most severe in the mid to basal inferior and  inferolateral wall. Diastolic filling pattern consistent with  restrictive physiology. Moderate left atrial enlargement.  Moderate calcific aortic stenosis with trivial aortic  regurgitation. Mild tricuspid regurgitation with PASP 38 mmHg.   Moderate to severe right atrial enlargement. Compared to the  previous study from December 2015, there has been further  reduction in LVEF, and progression in degree of aortic stenosis.  Assessment & Plan    1. Atypical Chest Pain/ Elevated Troponin/ History of CAD - developed a sternal chest pain with no associated symptoms, in the setting of his SBP being elevated in the 230's.  - Cardiac catheterization in 07/2014 showed 40-50% proximal LAD, 40% ostial D1, 80% Ramus, and 40% RCA. He was medically managed at the time. - chest pain resolved upon improvement in his BP. Troponin values have been 0.33, 0.87, 0.95, and 1.01, thought to be secondary to demand ischemia in the setting of  his hypertensive urgency. Was started on Heparin this morning due to increasing troponin values. Will recheck Troponin to see if it is trending up. Could continue medical treatment with Heparin for 48 hours. If his symptoms represented despite adequate BP control, could consider further ischemic evaluation at that time.  - continue medical management with ASA, statin, BB, ACE-I, Imdur, and Amlodipine.  2. Hypertensive Urgency - SBP initially elevated into the 230's. Improved with most reading being in the 140's / 70's. - Hydralazine increased to 75mg  Q8H on non-dialysis days with 50mg  on HD days.  - continue Amlodipine, Coreg, Imdur, and Lisinopril. Would not increase his BB at this time as he is having frequent missed beats. Will likely need to decrease his Coreg dose if BP allows.   3. Chronic Combined Systolic and Diastolic CHF - echo in [id number] showed EF of 30-35%. - does not appear volume overloaded on physical exam. - continue BB and ACE-I.  4. ESRD - HD on MWF schedule  5. HLD - continue statin therapy  6. Moderate Aortic Stenosis - followed as an outpatient  Signed, Erma Heritage , PA-C 8:59 AM 12/18/2015 Pager: [phone]  I have personally seen and examined this patient with Bernerd Pho, PA-C. I agree with the assessment and plan as outlined above. He was admitted with hypertensive urgency with chest pain in setting of SBP over 230. Now that BP is controlled, no further chest pain. Troponin elevation likely due to demand ischemia in setting of hypertensive urgency. My exam shows thin male, bilateral LE amputations with no edema, RRR, no murmurs. Labs reviewed. Meds reviewed. Agree with heparin for total 48 hours. No ischemic evaluation at this time unless he has a change in his clinical status or significant change in troponin.   Casilda Pickerill 12/18/2015 9:51 AM

## 2015-12-18 NOTE — Progress Notes (Signed)
ANTICOAGULATION CONSULT NOTE - Initial Consult  Pharmacy Consult for heparin Indication: chest pain/ACS  No Known Allergies  Patient Measurements: Weight: 131 lb 6.3 oz (59.6 kg)  Vital Signs: Temp: 98.5 F (36.9 C) (03/27 0400) Temp Source: Oral (03/27 0400) BP: 152/78 mmHg (03/27 0400) Pulse Rate: 84 (03/27 0400)  Labs:  Recent Labs  12/17/15 1233  12/17/15 1949 12/17/15 2212 12/18/15 0111  HGB 11.0*  --  10.5*  --   --   HCT 37.5*  --  36.3*  --   --   PLT 253  --  255  --   --   CREATININE 8.62*  --  9.21*  --   --   TROPONINI 0.14*  < > 0.87* 0.96* 1.01*  < > = values in this interval not displayed.  Estimated Creatinine Clearance: 4.4 mL/min (by C-G formula based on Cr of 9.21).   Medical History: Past Medical History  Diagnosis Date  . Hypercholesterolemia   . Gout   . Nonischemic cardiomyopathy (HCC)     LVEF 35-40%  . Anemia of chronic disease   . Essential hypertension   . Arthritis   . Peripheral vascular disease ([place])     Status post right below knee amputation for a nonhealing wound of the right foot 12/2014  . History of pneumonia 2014  . CAD (coronary artery disease)     Moderate multivessel disease 07/2015 - managed medically  . History of myopericarditis 2015    07/2015  . Stroke [facility])     "they said I did"  "I did not know anything about it"  . ESRD on hemodialysis [facility])     M/W/F in [place] - Dr. Florene Glen  . Constipation   . History of blood transfusion   . Pneumonia   . Acute myopericarditis 2015    Medications:  Prescriptions prior to admission  Medication Sig Dispense Refill Last Dose  . amLODipine (NORVASC) 10 MG tablet Take 0.5 tablets (5 mg total) by mouth daily.   12/17/2015 at Unknown time  . aspirin 81 MG tablet Take 81 mg by mouth daily.   12/17/2015 at Unknown time  . atorvastatin (LIPITOR) 80 MG tablet Take 1 tablet (80 mg total) by mouth daily. 90 tablet 3 12/17/2015 at Unknown time  . calcitRIOL (ROCALTROL) 0.5 MCG  capsule Take 1 capsule (0.5 mcg total) by mouth daily. 30 capsule 3 12/17/2015 at Unknown time  . carvedilol (COREG) 25 MG tablet Take 1 tablet (25 mg total) by mouth 2 (two) times daily with a meal. 60 tablet 1 12/17/2015 at 8a  . docusate sodium (COLACE) 100 MG capsule Take 2 capsules (200 mg total) by mouth daily. 10 capsule 0 12/17/2015 at Unknown time  . gabapentin (NEURONTIN) 100 MG capsule Take 1 capsule (100 mg total) by mouth at bedtime. (Patient taking differently: Take 100 mg by mouth at bedtime as needed (for neuropathy). )   12/17/2015 at Unknown time  . hydrALAZINE (APRESOLINE) 50 MG tablet Take 1 tablet (50 mg total) by mouth every 8 (eight) hours. 90 tablet 3 12/17/2015 at 8a  . isosorbide mononitrate (IMDUR) 30 MG 24 hr tablet Take 0.5 tablets (15 mg total) by mouth at bedtime. 45 tablet 3 12/17/2015 at Unknown time  . lidocaine-prilocaine (EMLA) cream Apply 1 application topically as needed. For dialysis  10 Past Week at Unknown time  . lisinopril (PRINIVIL,ZESTRIL) 20 MG tablet Take 1 tablet (20 mg total) by mouth 2 (two) times daily. 60 tablet 3 12/17/2015  at Unknown time  . LUMIGAN 0.01 % SOLN Place 1 drop into both eyes at bedtime.  11 12/16/2015 at Unknown time  . multivitamin (RENA-VIT) TABS tablet Take 1 tablet by mouth daily.   12/17/2015 at Unknown time  . oxyCODONE (OXY IR/ROXICODONE) 5 MG immediate release tablet Take 1 tablet (5 mg total) by mouth every 6 (six) hours as needed for moderate pain or severe pain. 50 tablet 0 12/17/2015 at Unknown time  . pantoprazole (PROTONIX) 40 MG tablet Take 1 tablet (40 mg total) by mouth daily at 12 noon.   12/17/2015 at Unknown time  . polyvinyl alcohol (LIQUIFILM TEARS) 1.4 % ophthalmic solution Place 1 drop into both eyes as needed for dry eyes. 15 mL 0 12/17/2015 at Unknown time  . sevelamer carbonate (RENVELA) 800 MG tablet Take 1,600-2,400 mg by mouth See admin instructions. Take 3 tablets (2400 mg) with meals and 2 tablets (1600 mg) with  snacks   12/17/2015 at Unknown time  . SIMBRINZA 1-0.2 % SUSP Place 1 drop into both eyes 2 (two) times daily.  11 12/17/2015 at Unknown time   Scheduled:  . amLODipine  5 mg Oral Daily  . aspirin EC  324 mg Oral Daily  . atorvastatin  80 mg Oral Daily  . calcitRIOL  0.5 mcg Oral Daily  . carvedilol  25 mg Oral BID WC  . docusate sodium  200 mg Oral Daily  . gabapentin  100 mg Oral QHS  . hydrALAZINE  75 mg Oral 3 times per day  . isosorbide mononitrate  15 mg Oral QHS  . latanoprost  1 drop Both Eyes QHS  . lisinopril  40 mg Oral QHS  . multivitamin  1 tablet Oral Daily  . [START ON 12/19/2015] pantoprazole  40 mg Oral Q1200  . sevelamer carbonate  2,400 mg Oral TID WC    Assessment: 82yo male admitted last pm for hypertensive urgency, now better controlled, to begin heparin for CP and troponins trending up.  Goal of Therapy:  Heparin level 0.3-0.7 units/ml Monitor platelets by anticoagulation protocol: Yes   Plan:  Will give heparin 2000 units IV bolus x1 followed by gtt at 800 units/hr and monitor heparin levels and CBC.  Wynona Neat, PharmD, BCPS  12/18/2015,5:43 AM

## 2015-12-18 NOTE — Progress Notes (Signed)
Dr Oval Linsey notified of + Troponin values. Pt denies c/o CP. VS stable. Will continue to monitor.

## 2015-12-18 NOTE — Care Management Obs Status (Signed)
[place] NOTIFICATION   Patient Details  Name: TYRIAN INFANTE MRN: [id number] Date of Birth: 03/05/1932   Medicare Observation Status Notification Given:  Yes (chest pain)    Bethena Roys, RN 12/18/2015, 12:25 PM

## 2015-12-18 NOTE — Consult Note (Signed)
[facility] Renal Consultation Note    Indication for Consultation:  Management of ESRD/hemodialysis; anemia, hypertension/volume and secondary hyperparathyroidism PCP: [name], MD  HPI: Randy [name] is a 82 y.o. male with ESRD on hemodialysis MWF at [facility]. Past medical history significant for hypertension, nonischemic cardiomyopathy EF 35-40%, PVD, CAD, myopericarditis, Mild to moderate aortic stenosis, atrial fibrillation off anticoagulation, PNA, arthritis, gout, CVA who presented to ED 12/17/15 with diffuse burning, substernal chest without N, V or diaphoresis. Patient states, "My blood pressure was real high but as soon as it came down the chest pain left". No C/O fever, chills, URI symptoms, back pain, flank pain, headache, dizziness, syncope, DOE, SOB, orthopnea. "Everything else has been fine". EKG unchanged from last visit. Troponin elevation flat. Peak 1.01. He was given one SL nitroglycerin with relief of symptoms. Cardiology has been consulted, hypertensive meds adjusted.  Currently he is on HD and pain free. He has HD at [facility] MWF. He is typically compliant to HD prescription.Recent labs: HGB 11.1 ( 12/13/15) WBC 6.84 HCT 34.9 PLT 249 Fe 33 TIBC 144 Tsat 23 Ca 9.2 C Ca 9.7 Phos 7.7 PTH 686 (12/06/15). He has been off binders due to low Phos-will restart now. On calcitriol for PTH suppression, Mircera 200 mcg IV q 2 weeks for anemia.  Past Medical History  Diagnosis Date  . Hypercholesterolemia   . Gout   . Nonischemic cardiomyopathy (HCC)     LVEF 35-40%  . Anemia of chronic disease   . Essential hypertension   . Arthritis   . Peripheral vascular disease ([place])     Status post right below knee amputation for a nonhealing wound of the right foot 12/2014  . History of pneumonia 2014  . CAD (coronary artery disease)     Moderate multivessel disease 07/2015 - managed medically  . History of myopericarditis 2015     07/2015  . Stroke [facility])     "they said I did"  "I did not know anything about it"  . ESRD on hemodialysis [facility])     M/W/F in [place] - Dr. Florene Glen  . Constipation   . History of blood transfusion   . Pneumonia   . Acute myopericarditis 2015   Past Surgical History  Procedure Laterality Date  . Knee arthroscopy Right 2007  . Back surgery    . Hemorrhoid surgery  1970's  . Ganglion cyst excision  01/03/2012    Procedure: REMOVAL GANGLION OF WRIST;  Surgeon: Scherry Ran, MD;  Location: [facility];  Service: General;  Laterality: Right;  . Colonoscopy    . Av fistula placement  08/24/2012    Procedure: ARTERIOVENOUS (AV) FISTULA CREATION;  Surgeon: Rosetta Posner, MD;  Location: [place];  Service: Vascular;  Laterality: Left;  . Insertion of dialysis catheter Right      neck  . Insertion of dialysis catheter  10/19/2012    Procedure: INSERTION OF DIALYSIS CATHETER;  Surgeon: Rosetta Posner, MD;  Location: [place];  Service: Vascular;  Laterality: N/A;  REMOVE TEMPORARY CATH  . Cholecystectomy    . Lumbar disc surgery  2004  . Left heart catheterization with coronary angiogram N/A 08/17/2014    Procedure: LEFT HEART CATHETERIZATION WITH CORONARY ANGIOGRAM;  Surgeon: Larey Dresser, MD;  Location: [facility] CATH LAB;  Service: Cardiovascular;  Laterality: N/A;  . Colonoscopy Left 09/26/2014    Procedure: COLONOSCOPY;  Surgeon: Arta Silence, MD;  Location: [facility] ENDOSCOPY;  Service: Endoscopy;  Laterality: Left;  . Amputation Right 01/05/2015    Procedure: AMPUTATION BELOW KNEE;  Surgeon: Angelia Mould, MD;  Location: [place];  Service: Vascular;  Laterality: Right;  . Capd removal N/A 03/13/2015    Procedure: CONTINUOUS AMBULATORY PERITONEAL DIALYSIS  (CAPD) CATHETER REMOVAL;  Surgeon: Coralie Keens, MD;  Location: [place];  Service: General;  Laterality: N/A;  . Cardiac catheterization    . Above knee leg amputation Left 10/10/2015  . Amputation Left 10/10/2015    Procedure: AMPUTATION ABOVE KNEE  LEFT;  Surgeon: Angelia Mould, MD;  Location[facility] OR;  Service: Vascular;  Laterality: Left;   Family History  Problem Relation Age of Onset  . Arthritis    . Cancer    . Kidney disease    . Anesthesia problems Neg Hx   . Hypotension Neg Hx   . Malignant hyperthermia Neg Hx   . Pseudochol deficiency Neg Hx   . Cancer Sister   . Colon cancer Brother   . Colon cancer Brother    Social History:  reports that he quit smoking about 17 years ago. His smoking use included Cigarettes. He has a 30 pack-year smoking history. He quit smokeless tobacco use about 21 years ago. His smokeless tobacco use included Chew. He reports that he does not drink alcohol or use illicit drugs. No Known Allergies Prior to Admission medications   Medication Sig Start Date End Date Taking? Authorizing Provider  amLODipine (NORVASC) 10 MG tablet Take 0.5 tablets (5 mg total) by mouth daily. 10/31/15  Yes Ivan Anchors Love, PA-C  aspirin 81 MG tablet Take 81 mg by mouth daily.   Yes Historical Provider, MD  atorvastatin (LIPITOR) 80 MG tablet Take 1 tablet (80 mg total) by mouth daily. 11/02/15  Yes Arnoldo Lenis, MD  calcitRIOL (ROCALTROL) 0.5 MCG capsule Take 1 capsule (0.5 mcg total) by mouth daily. 11/10/15  Yes Lucia Gaskins, MD  carvedilol (COREG) 25 MG tablet Take 1 tablet (25 mg total) by mouth 2 (two) times daily with a meal. 10/31/15  Yes Ivan Anchors Love, PA-C  docusate sodium (COLACE) 100 MG capsule Take 2 capsules (200 mg total) by mouth daily. 10/31/15  Yes Ivan Anchors Love, PA-C  gabapentin (NEURONTIN) 100 MG capsule Take 1 capsule (100 mg total) by mouth at bedtime. Patient taking differently: Take 100 mg by mouth at bedtime as needed (for neuropathy).  10/23/15  Yes Barton Dubois, MD  hydrALAZINE (APRESOLINE) 50 MG tablet Take 1 tablet (50 mg total) by mouth every 8 (eight) hours. 11/10/15  Yes Lucia Gaskins, MD  isosorbide mononitrate (IMDUR) 30 MG 24 hr tablet Take 0.5 tablets (15 mg total) by mouth at  bedtime. 09/20/15  Yes Arnoldo Lenis, MD  lidocaine-prilocaine (EMLA) cream Apply 1 application topically as needed. For dialysis 10/06/15  Yes Historical Provider, MD  lisinopril (PRINIVIL,ZESTRIL) 20 MG tablet Take 1 tablet (20 mg total) by mouth 2 (two) times daily. 11/10/15  Yes Richard Dondiego, MD  LUMIGAN 0.01 % SOLN Place 1 drop into both eyes at bedtime. 09/18/15  Yes Historical Provider, MD  multivitamin (RENA-VIT) TABS tablet Take 1 tablet by mouth daily.   Yes Historical Provider, MD  oxyCODONE (OXY IR/ROXICODONE) 5 MG immediate release tablet Take 1 tablet (5 mg total) by mouth every 6 (six) hours as needed for moderate pain or severe pain. 10/31/15  Yes Ivan Anchors Love, PA-C  pantoprazole (PROTONIX) 40 MG tablet Take 1 tablet (40 mg total) by mouth daily at 12  noon. 10/23/15  Yes Barton Dubois, MD  polyvinyl alcohol (LIQUIFILM TEARS) 1.4 % ophthalmic solution Place 1 drop into both eyes as needed for dry eyes. 10/31/15  Yes Ivan Anchors Love, PA-C  sevelamer carbonate (RENVELA) 800 MG tablet Take 1,600-2,400 mg by mouth See admin instructions. Take 3 tablets (2400 mg) with meals and 2 tablets (1600 mg) with snacks   Yes Historical Provider, MD  SIMBRINZA 1-0.2 % SUSP Place 1 drop into both eyes 2 (two) times daily. 09/18/15  Yes Historical Provider, MD   Current Facility-Administered Medications  Medication Dose Route Frequency Provider Last Rate Last Dose  . acetaminophen (TYLENOL) tablet 650 mg  650 mg Oral Q4H PRN Jonetta Osgood, MD      . amLODipine (NORVASC) tablet 5 mg  5 mg Oral Daily Jonetta Osgood, MD   5 mg at 12/18/15 1055  . aspirin EC tablet 324 mg  324 mg Oral Daily Jonetta Osgood, MD   324 mg at 12/18/15 1055  . atorvastatin (LIPITOR) tablet 80 mg  80 mg Oral Daily Jonetta Osgood, MD   80 mg at 12/18/15 1054  . calcitRIOL (ROCALTROL) capsule 0.5 mcg  0.5 mcg Oral Daily Jonetta Osgood, MD   0.5 mcg at 12/18/15 1055  . carvedilol (COREG) tablet 25 mg  25 mg Oral BID  WC Jonetta Osgood, MD   Stopped at 12/18/15 [phone]  . docusate sodium (COLACE) capsule 200 mg  200 mg Oral Daily Jonetta Osgood, MD   200 mg at 12/18/15 1055  . gabapentin (NEURONTIN) capsule 100 mg  100 mg Oral QHS Jonetta Osgood, MD   100 mg at 12/17/15 2244  . heparin ADULT infusion 100 units/mL (25000 units/250 mL)  800 Units/hr Intravenous Continuous Laren Everts, RPH 8 mL/hr at 12/18/15 0618 800 Units/hr at 12/18/15 0618  . hydrALAZINE (APRESOLINE) injection 10 mg  10 mg Intravenous Q6H PRN Jonetta Osgood, MD      . hydrALAZINE (APRESOLINE) tablet 75 mg  75 mg Oral 3 times per day Skeet Latch, MD   75 mg at 12/17/15 2242  . isosorbide mononitrate (IMDUR) 24 hr tablet 15 mg  15 mg Oral QHS Jonetta Osgood, MD   15 mg at 12/17/15 2239  . latanoprost (XALATAN) 0.005 % ophthalmic solution 1 drop  1 drop Both Eyes QHS Jonetta Osgood, MD   1 drop at 12/17/15 2244  . lisinopril (PRINIVIL,ZESTRIL) tablet 40 mg  40 mg Oral QHS Skeet Latch, MD   40 mg at 12/17/15 2240  . morphine 2 MG/ML injection 1 mg  1 mg Intravenous Q2H PRN Jonetta Osgood, MD      . multivitamin (RENA-VIT) tablet 1 tablet  1 tablet Oral Daily Jonetta Osgood, MD   1 tablet at 12/18/15 1054  . nitroGLYCERIN (NITROSTAT) SL tablet 0.4 mg  0.4 mg Sublingual Q5 min PRN Shanker Kristeen Mans, MD      . ondansetron [facility]) injection 4 mg  4 mg Intravenous Q6H PRN Shanker Kristeen Mans, MD      . oxyCODONE (Oxy IR/ROXICODONE) immediate release tablet 5 mg  5 mg Oral Q6H PRN Jonetta Osgood, MD      . Derrill Memo ON 12/19/2015] pantoprazole (PROTONIX) EC tablet 40 mg  40 mg Oral Q1200 Shanker Kristeen Mans, MD      . polyvinyl alcohol (LIQUIFILM TEARS) 1.4 % ophthalmic solution 1 drop  1 drop Both Eyes PRN Shanker Kristeen Mans, MD      .  sevelamer carbonate (RENVELA) tablet 1,600 mg  1,600 mg Oral PRN Jonetta Osgood, MD      . sevelamer carbonate (RENVELA) tablet 2,400 mg  2,400 mg Oral TID WC Jonetta Osgood, MD   2,400  mg at 12/18/15 0858   Labs: Basic Metabolic Panel:  Recent Labs Lab 12/17/15 1233 12/17/15 1949 12/18/15 1232  NA 136  --  137  K 4.8  --  5.3*  CL 95*  --  96*  CO2 26  --  25  GLUCOSE 158*  --  110*  BUN 54*  --  76*  CREATININE 8.62* 9.21* 11.12*  CALCIUM 9.5  --  9.4  PHOS  --   --  8.7*   Liver Function Tests:  Recent Labs Lab 12/18/15 1232  ALBUMIN 2.6*   No results for input(s): LIPASE, AMYLASE in the last 168 hours. No results for input(s): AMMONIA in the last 168 hours. CBC:  Recent Labs Lab 12/17/15 1233 12/17/15 1949 12/18/15 1232  WBC 7.0 6.1 7.0  NEUTROABS 5.6  --   --   HGB 11.0* 10.5* 10.9*  HCT 37.5* 36.3* 35.2*  MCV 95.9 94.3 93.6  PLT 253 255 264   Cardiac Enzymes:  Recent Labs Lab 12/17/15 1545 12/17/15 1949 12/17/15 2212 12/18/15 0111 12/18/15 0956  TROPONINI 0.33* 0.87* 0.96* 1.01* 0.82*   CBG: No results for input(s): GLUCAP in the last 168 hours. Iron Studies: No results for input(s): IRON, TIBC, TRANSFERRIN, FERRITIN in the last 72 hours. Studies/Results: Dg Chest 2 View  12/17/2015  CLINICAL DATA:  This morning he started to have right sided chest pain, patient states he has the pain when his blood pressure gets high. HX HTN, CAD, Pneumonia, stroke EXAM: CHEST  2 VIEW COMPARISON:  11/08/2015 FINDINGS: Stable enlarged cardiac silhouette. LEFT basilar atelectasis effusion. No edema or infiltrate. No pneumothorax. IMPRESSION: LEFT basilar atelectasis and effusion. Electronically Signed   By: Suzy Bouchard M.D.   On: 12/17/2015 13:35    ROS: As per HPI otherwise negative.   Physical Exam: Filed Vitals:   12/18/15 1227 12/18/15 1240 12/18/15 1256 12/18/15 1318  BP: 152/55 119/40 116/56 131/66  Pulse: 77  60 87  Temp:      TempSrc:      Resp:      Weight:      SpO2:         General: Well developed, well nourished, in no acute distress. Head: Normocephalic, atraumatic, sclera non-icteric, mucus membranes are  moist Neck: Supple. JVD not elevated. Lungs: Clear bilaterally to auscultation without wheezes, rales, or rhonchi. Breathing is unlabored. Heart: RRR with S1 S2.  II//VI systolic murmur, No rubs, or gallops appreciated. Abdomen: Soft, non-tender, non-distended with normoactive bowel sounds. No rebound/guarding. No obvious abdominal masses. M-S:  Strength and tone appear normal for age. Lower extremities: L AKA R BKA no LE edema Neuro: Alert and oriented X 3. Moves all extremities spontaneously. Psych:  Responds to questions appropriately with a normal affect. Dialysis Access: LFA AVF + bruit.  Dialysis Orders: Center: MWF  on [facility] . MonWedFri, 3 hrs 30 min, 180NRe Optiflux, BFR 400, DFR Autoflow 2.0, EDW 56.5 (kg), Dialysate 2.0 K, 2.25 Ca,UFR Profile: Profile 4, Sodium Model: Linear, Access: LFA AV Fistula Mircera: 200 mcg IV q 2 weeks (last dose 12/13/15) Calcitriol: 1.25 mcg PO MWF (last dose 12/15/15)   Assessment/Plan: 1.  Chest Pain/Hypertensive urgency: Per primary, pain resolved when BP controlled. Cardiology consulted, Antihypertensive medications adjusted per  cardiology.  2.  Demand ischemia/Chronically : per primary/cardiology. Troponins flat-peak 1.01. No further chest pain.  3.  ESRD - MWF @ [place]. K+5.3. 2.0 K bath.  4.  Hypertension/volume  - BP now controlled. Pre wt 59.8, UFG 3000. Tolerating well. May need OP EDW lowered.  5.  Anemia  - HGB 10.9 recent dose of ESA. Follow HGB. 6.  Metabolic bone disease -  Ca 9.4 C Ca 10.5 Phos 8.7 Binders have been Dc'd. Will restart Renvela.  7.  Nutrition - Albumin 2.6. Renal diet/fld restrictions.   Rita H. Owens Shark, NP-C 12/18/2015, 1:41 PM  [employer] [phone]  I have seen and examined this patient and agree with plan as outlined by Juanell Fairly, NP-C.  Feels much better.   Governor Rooks Abriel Geesey,MD 12/18/2015 2:51 PM

## 2015-12-19 ENCOUNTER — Encounter (HOSPITAL_COMMUNITY): Payer: Self-pay | Admitting: General Practice

## 2015-12-19 DIAGNOSIS — R7989 Other specified abnormal findings of blood chemistry: Secondary | ICD-10-CM

## 2015-12-19 DIAGNOSIS — I160 Hypertensive urgency: Principal | ICD-10-CM

## 2015-12-19 LAB — HEPARIN LEVEL (UNFRACTIONATED)
HEPARIN UNFRACTIONATED: 0.75 [IU]/mL — AB (ref 0.30–0.70)
HEPARIN UNFRACTIONATED: 0.1 [IU]/mL — AB (ref 0.30–0.70)
HEPARIN UNFRACTIONATED: 0.17 [IU]/mL — AB (ref 0.30–0.70)

## 2015-12-19 LAB — RENAL FUNCTION PANEL
ALBUMIN: 2.6 g/dL — AB (ref 3.5–5.0)
Anion gap: 14 (ref 5–15)
BUN: 35 mg/dL — AB (ref 6–20)
CHLORIDE: 99 mmol/L — AB (ref 101–111)
CO2: 24 mmol/L (ref 22–32)
CREATININE: 6.52 mg/dL — AB (ref 0.61–1.24)
Calcium: 9.3 mg/dL (ref 8.9–10.3)
GFR, EST AFRICAN AMERICAN: 8 mL/min — AB (ref >60)
GFR, EST NON AFRICAN AMERICAN: 7 mL/min — AB (ref >60)
Glucose, Bld: 87 mg/dL (ref 65–99)
PHOSPHORUS: 6.7 mg/dL — AB (ref 2.5–4.6)
POTASSIUM: 4.4 mmol/L (ref 3.5–5.1)
Sodium: 137 mmol/L (ref 135–145)

## 2015-12-19 LAB — CBC
HEMATOCRIT: 36.9 % — AB (ref 39.0–52.0)
Hemoglobin: 10.9 g/dL — ABNORMAL LOW (ref 13.0–17.0)
MCH: 27.9 pg (ref 26.0–34.0)
MCHC: 29.5 g/dL — ABNORMAL LOW (ref 30.0–36.0)
MCV: 94.6 fL (ref 78.0–100.0)
PLATELETS: 241 10*3/uL (ref 150–400)
RBC: 3.9 MIL/uL — AB (ref 4.22–5.81)
RDW: 19.5 % — ABNORMAL HIGH (ref 11.5–15.5)
WBC: 6.1 10*3/uL (ref 4.0–10.5)

## 2015-12-19 MED ORDER — CARVEDILOL 12.5 MG PO TABS
12.5000 mg | ORAL_TABLET | Freq: Two times a day (BID) | ORAL | Status: DC
  Administered 2015-12-19: 12.5 mg via ORAL
  Filled 2015-12-19: qty 1

## 2015-12-19 NOTE — Progress Notes (Signed)
ANTICOAGULATION CONSULT NOTE - Follow-up Consult  Pharmacy Consult for heparin Indication: chest pain/ACS  No Known Allergies  Patient Measurements: Weight: 126 lb 1.7 oz (57.2 kg)  Vital Signs: Temp: 98.2 F (36.8 C) (03/28 0858) Temp Source: Oral (03/28 0858) BP: 113/75 mmHg (03/28 0328) Pulse Rate: 85 (03/28 0858)  Labs:  Recent Labs  12/17/15 1949 12/17/15 2212 12/18/15 0111 12/18/15 0956 12/18/15 1232 12/18/15 2211 12/19/15 0318 12/19/15 0734  HGB 10.5*  --   --   --  10.9*  --  10.9*  --   HCT 36.3*  --   --   --  35.2*  --  36.9*  --   PLT 255  --   --   --  264  --  241  --   HEPARINUNFRC  --   --   --   --   --  <0.10*  --  0.75*  CREATININE 9.21*  --   --   --  11.12*  --  6.52*  --   TROPONINI 0.87* 0.96* 1.01* 0.82*  --   --   --   --     Estimated Creatinine Clearance: 6.2 mL/min (by C-G formula based on Cr of 6.52).  Assessment: 82 yo male on heparin for elevated trop -now trending down.   Upon discussion with the patient, the level may not have been drawn correctly. Pt is ESRD on HD and wt < 60 kg. Cardiology planning to continue heparin gtt for 48 hours.  Initial heparin level drawn this morning was supratherapeutic, this was drawn from the same arm that heparin is infusing through. The level was re-drawn from the opposite arm, this level was undetectable.  Goal of Therapy:  Heparin level 0.3-0.7 units/ml Monitor platelets by anticoagulation protocol: Yes    Plan:  -Increase heparin to 1200 units/hr -Check level this evening -Plan to dc heparin tomorrow morning -Daily HL, CBC     Hughes Better, PharmD, BCPS Clinical Pharmacist 12/19/2015 9:57 AM

## 2015-12-19 NOTE — Progress Notes (Signed)
[facility]  Patient Status: Active (receiving services up to time of hospitalization)  [facility] is providing the following services: RN and PT  If patient discharges after hours, please call [phone].   Randy [name] 12/19/2015, 5:25 PM

## 2015-12-19 NOTE — Progress Notes (Signed)
PROGRESS NOTE  Randy [name] [id number] DOB: 19-Jun-1932 DOA: 12/17/2015 PCP: [name], MD Outpatient Specialists:    LOS: 2 days   Brief Narrative: 82 y.o. male with a Past Medical History of ESRD on hemodialysis MWF, nonobstructive CAD, history of peripheral vascular disease status post bilateral lower extremity amputee (right BKA, left AKA), hypertension, chronic systolic heart failure who presented to the ED for evaluation of chest pain and high blood pressure  Assessment & Plan: Principal Problem:   Chest pain Active Problems:   Anemia of chronic disease   ESRD (end stage renal disease) on dialysis ([place])   CAD (coronary artery disease)   S/P BKA (below knee amputation) unilateral ([place])   Status post above knee amputation of left lower extremity (HCC)   Chronic combined systolic and diastolic congestive heart failure (HCC)   Essential hypertension   S/P AKA (above knee amputation) unilateral (HCC)   Elevated troponin   Chest pain  - Likely in the setting of poorly controlled hypertension and hypertensive urgency, chest pain is resolved with improvement in his blood pressure - His troponin has been progressively climbing to greater than 1, he was started on IV heparin 3/27 am,.  - Cardiology following and recommended continue IV heparin for at least 48 hours, no further invasive testing unless significant events, heparin may be able to be discontinued 3/20 9 AM  ESRD  - On hemodialysis MWF.  - Nephrology consulted - Underwent hemodialysis 3/27  Anemia of chronic disease  - Continue IV iron/Aranesp per nephrology.   CAD (coronary artery disease) - As above-continue aspirin, statin and Coreg.  - cardiology seeing  Essential hypertension - Blood pressure apparently fluctuates, currently reasonably well-controlled. Continue usual antihypertensives. Follow. - Coreg decreased today from 25 twice a day to 12.5 twice a day per cardiology for 1.5 second  pauses  Chronic systolic heart failure - Clinically compensated, was a removal during hemodialysis. Last EF by TTE November 2016 30-35%.  Aortic stenosis  Bilateral lower extremity amputee   DVT prophylaxis: heparin infusion Code Status: Full Family Communication: no family bedside Disposition Plan: home when ready  Barriers for discharge: IV heparin  Consultants:   Nephrology   Cardiology   Procedures:   None   Antimicrobials:  None    Subjective: - He feels well, no chest pain this morning, no palpitations  Objective: Filed Vitals:   12/18/15 2245 12/18/15 2247 12/18/15 2330 12/19/15 0328  BP: 129/72 129/72 111/67 113/75  Pulse:  76 72 67  Temp:   99 F (37.2 C) 98.6 F (37 C)  TempSrc:   Oral Oral  Resp:  18 17 15   Weight:    57.2 kg (126 lb 1.7 oz)  SpO2:   97% 96%    Intake/Output Summary (Last 24 hours) at 12/19/15 0647 Last data filed at 12/19/15 0400  Gross per 24 hour  Intake 424.23 ml  Output   3001 ml  Net -2576.77 ml   Filed Weights   12/18/15 1212 12/18/15 1539 12/19/15 0328  Weight: 59.8 kg (131 lb 13.4 oz) 57.1 kg (125 lb 14.1 oz) 57.2 kg (126 lb 1.7 oz)    Examination: Constitutional: NAD, calm, comfortable Filed Vitals:   12/18/15 2245 12/18/15 2247 12/18/15 2330 12/19/15 0328  BP: 129/72 129/72 111/67 113/75  Pulse:  76 72 67  Temp:   99 F (37.2 C) 98.6 F (37 C)  TempSrc:   Oral Oral  Resp:  18 17 15   Weight:    57.2  kg (126 lb 1.7 oz)  SpO2:   97% 96%   Eyes: PERRL, lids and conjunctivae normal ENMT: Mucous membranes are moist. Respiratory: clear to auscultation bilaterally, no wheezing, no crackles. Normal respiratory effort. No accessory muscle use.  Cardiovascular: Regular rate and rhythm, + murmur. No extremity edema. 2+ pedal pulses.  Abdomen: no tenderness, no masses palpated. Musculoskeletal: no clubbing / cyanosis. Bilateral amputee. Skin: no rashes, lesions, ulcers. No induration Neurologic: non focal.  Bilateral LE amputee Psychiatric: Normal judgment and insight. Alert and oriented x 3. Normal mood.    Data Reviewed: I have personally reviewed following labs and imaging studies  CBC:  Recent Labs Lab 12/17/15 1233 12/17/15 1949 12/18/15 1232 12/19/15 0318  WBC 7.0 6.1 7.0 6.1  NEUTROABS 5.6  --   --   --   HGB 11.0* 10.5* 10.9* 10.9*  HCT 37.5* 36.3* 35.2* 36.9*  MCV 95.9 94.3 93.6 94.6  PLT 253 255 264 [id number]   Basic Metabolic Panel:  Recent Labs Lab 12/17/15 1233 12/17/15 1949 12/18/15 1232 12/19/15 0318  NA 136  --  137 137  K 4.8  --  5.3* 4.4  CL 95*  --  96* 99*  CO2 26  --  25 24  GLUCOSE 158*  --  110* 87  BUN 54*  --  76* 35*  CREATININE 8.62* 9.21* 11.12* 6.52*  CALCIUM 9.5  --  9.4 9.3  PHOS  --   --  8.7* 6.7*   Cardiac Enzymes:  Recent Labs Lab 12/17/15 1545 12/17/15 1949 12/17/15 2212 12/18/15 0111 12/18/15 0956  TROPONINI 0.33* 0.87* 0.96* 1.01* 0.82*   Urine analysis:    Component Value Date/Time   COLORURINE YELLOW 06/22/2014 [place] 06/22/2014 0531   LABSPEC 1.015 06/22/2014 0531   PHURINE 6.0 06/22/2014 0531   GLUCOSEU 100* 06/22/2014 0531   HGBUR SMALL* 06/22/2014 0531   HGBUR trace-lysed 06/03/2008 0855   BILIRUBINUR NEGATIVE 06/22/2014 0531   KETONESUR TRACE* 06/22/2014 0531   PROTEINUR 100* 06/22/2014 0531   UROBILINOGEN 0.2 06/22/2014 0531   NITRITE NEGATIVE 06/22/2014 0531   LEUKOCYTESUR NEGATIVE 06/22/2014 0531   Radiology Studies: Dg Chest 2 View  12/17/2015  CLINICAL DATA:  This morning he started to have right sided chest pain, patient states he has the pain when his blood pressure gets high. HX HTN, CAD, Pneumonia, stroke EXAM: CHEST  2 VIEW COMPARISON:  11/08/2015 FINDINGS: Stable enlarged cardiac silhouette. LEFT basilar atelectasis effusion. No edema or infiltrate. No pneumothorax. IMPRESSION: LEFT basilar atelectasis and effusion. Electronically Signed   By: Suzy Bouchard M.D.   On: 12/17/2015  13:35   Scheduled Meds: . amLODipine  5 mg Oral Daily  . aspirin EC  324 mg Oral Daily  . atorvastatin  80 mg Oral Daily  . calcitRIOL  0.5 mcg Oral Daily  . carvedilol  25 mg Oral BID WC  . docusate sodium  200 mg Oral Daily  . gabapentin  100 mg Oral QHS  . hydrALAZINE  75 mg Oral 3 times per day  . isosorbide mononitrate  15 mg Oral QHS  . latanoprost  1 drop Both Eyes QHS  . lisinopril  40 mg Oral QHS  . multivitamin  1 tablet Oral Daily  . pantoprazole  40 mg Oral Q1200  . sevelamer carbonate  2,400 mg Oral TID WC   Continuous Infusions: . heparin 1,050 Units/hr (12/19/15 0400)    Marzetta Board, MD, PhD [facility] Pager [phone]  If  7PM-7AM, please contact night-coverage [email or web address] Password [facility] 12/19/2015, 6:47 AM

## 2015-12-19 NOTE — Care Management Note (Addendum)
Case Management Note  Patient Details  Name: Randy [name] MRN: [id number] Date of Birth: 11/26/1931  Subjective/Objective: Pt in for Chest Pain. 82 y.o. male with a Past Medical History of ESRD on hemodialysis MWF, nonobstructive CAD, history of peripheral vascular disease status post bilateral lower extremity amputee (right BKA, left AKA), hypertension, chronic systolic heart failure who presented to the ED for evaluation of chest pain and high blood pressure                   Action/Plan: CM will continue to monitor for additional needs.    Expected Discharge Date:  12/19/15               Expected Discharge Plan:  [place]  In-House Referral:  NA  Discharge planning Services  CM Consult  Post Acute Care Choice:   [place], Resumption of Services.  Choice offered to:   Patient  DME Arranged:   N/A DME Agency:   N/A  HH Arranged:   RN,PT [place] Agency:   [facility]  Status of Service: Completed.  Medicare Important Message Given:    Date Medicare IM Given:    Medicare IM give by:    Date Additional Medicare IM Given:    Additional Medicare Important Message give by:     If discussed at [place] of Stay Meetings, dates discussed:    Additional Comments: 1440 12-20-15 Jacqlyn Krauss, RN,BSN [phone] Pt was d/c home with [facility] via [facility].   Bethena Roys, RN 12/19/2015, 10:50 AM

## 2015-12-19 NOTE — Progress Notes (Signed)
Patient ID: Randy [name], male   DOB: 23-Jun-1932, 82 y.o.   MRN: [id number]  [name] Progress Note    Subjective:   Feels good, no recurrent CP   Objective:   BP 113/75 mmHg  Pulse 85  Temp(Src) 98.2 F (36.8 C) (Oral)  Resp 15  Wt 57.2 kg (126 lb 1.7 oz)  SpO2 97%  Intake/Output: I/O last 3 completed shifts: In: 544.2 [P.O.:360; I.V.:184.2] Out: 3001 [Other:3000; Stool:1]   Intake/Output this shift:  Total I/O In: 480 [P.O.:480] Out: -  Weight change: 0.5 kg (1 lb 1.6 oz)  Physical Exam: Gen:WD WN AAM in NAD CVS:no rub Resp:cta [id number] Ext:no edema, L AVF +T/B  Labs: BMET  Recent Labs Lab 12/17/15 1233 12/17/15 1949 12/18/15 1232 12/19/15 0318  NA 136  --  137 137  K 4.8  --  5.3* 4.4  CL 95*  --  96* 99*  CO2 26  --  25 24  GLUCOSE 158*  --  110* 87  BUN 54*  --  76* 35*  CREATININE 8.62* 9.21* 11.12* 6.52*  ALBUMIN  --   --  2.6* 2.6*  CALCIUM 9.5  --  9.4 9.3  PHOS  --   --  8.7* 6.7*   CBC  Recent Labs Lab 12/17/15 1233 12/17/15 1949 12/18/15 1232 12/19/15 0318  WBC 7.0 6.1 7.0 6.1  NEUTROABS 5.6  --   --   --   HGB 11.0* 10.5* 10.9* 10.9*  HCT 37.5* 36.3* 35.2* 36.9*  MCV 95.9 94.3 93.6 94.6  PLT 253 255 264 241    @IMGRELPRIORS @ Medications:    . amLODipine  5 mg Oral Daily  . aspirin EC  324 mg Oral Daily  . atorvastatin  80 mg Oral Daily  . calcitRIOL  0.5 mcg Oral Daily  . carvedilol  12.5 mg Oral BID WC  . docusate sodium  200 mg Oral Daily  . gabapentin  100 mg Oral QHS  . hydrALAZINE  75 mg Oral 3 times per day  . isosorbide mononitrate  15 mg Oral QHS  . latanoprost  1 drop Both Eyes QHS  . lisinopril  40 mg Oral QHS  . multivitamin  1 tablet Oral Daily  . pantoprazole  40 mg Oral Q1200  . sevelamer carbonate  2,400 mg Oral TID WC   Dialysis Orders: Center: MWF on [facility] . MonWedFri, 3 hrs 30 min, 180NRe Optiflux, BFR 400, DFR Autoflow 2.0, EDW 56.5 (kg), Dialysate 2.0 K,  2.25 Ca,UFR Profile: Profile 4, Sodium Model: Linear, Access: LFA AV Fistula Mircera: 200 mcg IV q 2 weeks (last dose 12/13/15) Calcitriol: 1.25 mcg PO MWF (last dose 12/15/15)   Assessment/ Plan:   1. Atypical chest pain in setting of hypertensive urgency.  [facility] evaluation.  Feels better with adequate BP control. 2. ESRD: cont MWF schedule 3. Anemia:on ESA 4. CKD-MBD: stress compliance with binders and diet. 5. Nutrition: increase protein intake 6. Hypertension:  Much better stressed compliance with medications.  Donetta Potts, MD [place] Pager [phone] 12/19/2015, 10:06 AM

## 2015-12-19 NOTE — Progress Notes (Signed)
ANTICOAGULATION CONSULT NOTE - Follow-up Consult  Pharmacy Consult for heparin Indication: chest pain/ACS  No Known Allergies  Patient Measurements: Height: 4' (121.9 cm) (bilateral amputation) Weight: 126 lb 1.7 oz (57.2 kg) IBW/kg (Calculated) : 22.4  Vital Signs: Temp: 98.2 F (36.8 C) (03/28 2004) Temp Source: Oral (03/28 2004) BP: 142/69 mmHg (03/28 1535) Pulse Rate: 77 (03/28 1257)  Labs:  Recent Labs  12/17/15 1949 12/17/15 2212 12/18/15 0111 12/18/15 0956 12/18/15 1232  12/19/15 0318 12/19/15 0734 12/19/15 1030 12/19/15 2039  HGB 10.5*  --   --   --  10.9*  --  10.9*  --   --   --   HCT 36.3*  --   --   --  35.2*  --  36.9*  --   --   --   PLT 255  --   --   --  264  --  241  --   --   --   HEPARINUNFRC  --   --   --   --   --   < >  --  0.75* 0.10* 0.17*  CREATININE 9.21*  --   --   --  11.12*  --  6.52*  --   --   --   TROPONINI 0.87* 0.96* 1.01* 0.82*  --   --   --   --   --   --   < > = values in this interval not displayed.   Assessment: 82 yo male on heparin for elevated trop -now trending down.   Upon discussion with the patient, the level may not have been drawn correctly. Pt is ESRD on HD and wt < 60 kg. Cardiology planning to continue heparin gtt for 48 hours.  HL 0.17 on 1200 units   Goal of Therapy:  Heparin level 0.3-0.7 units/ml Monitor platelets by anticoagulation protocol: Yes    Plan:  1. Increase heparin to 1350 units/hr; per cardiology continue heparin for 48 hours and then stop so have stop date/time for 3/29 at 0700 2. discontinue heparin levels with plans to stop    Duayne Cal

## 2015-12-19 NOTE — Progress Notes (Signed)
Hospital Problem List     Principal Problem:   Chest pain Active Problems:   Anemia of chronic disease   ESRD (end stage renal disease) on dialysis (HCC)   CAD (coronary artery disease)   S/P BKA (below knee amputation) unilateral (HCC)   Status post above knee amputation of left lower extremity (HCC)   Chronic combined systolic and diastolic congestive heart failure (HCC)   Essential hypertension   S/P AKA (above knee amputation) unilateral (HCC)   Elevated troponin    Patient Profile:   Primary Cardiologist: Dr. Harl Bowie  82 yo male w/ PMH of chronic combined systolic and diastolic CHF (EF 99991111), ESRD (HD- MWF), moderate CAD (by cath in 2015 with most significant being 80% stenosis in Ramus - Medically Managed), HTN, and HLD who presented with atypical chest pain on 12/17/2015 with a reported SBP in the 230's at that time.  Subjective   Denies any recurrence of chest pain or palpitations. Blood pressure remains well-controlled.   Inpatient Medications    . amLODipine  5 mg Oral Daily  . aspirin EC  324 mg Oral Daily  . atorvastatin  80 mg Oral Daily  . calcitRIOL  0.5 mcg Oral Daily  . carvedilol  25 mg Oral BID WC  . docusate sodium  200 mg Oral Daily  . gabapentin  100 mg Oral QHS  . hydrALAZINE  75 mg Oral 3 times per day  . isosorbide mononitrate  15 mg Oral QHS  . latanoprost  1 drop Both Eyes QHS  . lisinopril  40 mg Oral QHS  . multivitamin  1 tablet Oral Daily  . pantoprazole  40 mg Oral Q1200  . sevelamer carbonate  2,400 mg Oral TID WC    Vital Signs    Filed Vitals:   12/18/15 2247 12/18/15 2330 12/19/15 0328 12/19/15 0858  BP: 129/72 111/67 113/75   Pulse: 76 72 67 85  Temp:  99 F (37.2 C) 98.6 F (37 C) 98.2 F (36.8 C)  TempSrc:  Oral Oral Oral  Resp: 18 17 15    Weight:   126 lb 1.7 oz (57.2 kg)   SpO2:  97% 96% 97%    Intake/Output Summary (Last 24 hours) at 12/19/15 0903 Last data filed at 12/19/15 0859  Gross per 24 hour  Intake  904.23 ml  Output   3001 ml  Net -2096.77 ml   Filed Weights   12/18/15 1212 12/18/15 1539 12/19/15 0328  Weight: 131 lb 13.4 oz (59.8 kg) 125 lb 14.1 oz (57.1 kg) 126 lb 1.7 oz (57.2 kg)    Physical Exam    General: Elderly African American male appearing in no acute distress. Head: Normocephalic, atraumatic.  Neck: Supple without bruits, JVD not elevated. Lungs:  Resp regular and unlabored, CTA without wheezing or rales. Heart: RRR, S1, S2, no S3, S4, 2/6 SEM at RUSB; no rub. Abdomen: Soft, non-tender, non-distended with normoactive bowel sounds. No hepatomegaly. No rebound/guarding. No obvious abdominal masses. Extremities: No clubbing, cyanosis, or edema. BKA on right. AKA on left. Neuro: Alert and oriented X 3. Moves all extremities spontaneously. Psych: Normal affect.  Labs    CBC  Recent Labs  12/17/15 1233  12/18/15 1232 12/19/15 0318  WBC 7.0  < > 7.0 6.1  NEUTROABS 5.6  --   --   --   HGB 11.0*  < > 10.9* 10.9*  HCT 37.5*  < > 35.2* 36.9*  MCV 95.9  < > 93.6 94.6  PLT 253  < > 264 241  < > = values in this interval not displayed. Basic Metabolic Panel  Recent Labs  12/18/15 1232 12/19/15 0318  NA 137 137  K 5.3* 4.4  CL 96* 99*  CO2 25 24  GLUCOSE 110* 87  BUN 76* 35*  CREATININE 11.12* 6.52*  CALCIUM 9.4 9.3  PHOS 8.7* 6.7*   Liver Function Tests  Recent Labs  12/18/15 1232 12/19/15 0318  ALBUMIN 2.6* 2.6*   No results for input(s): LIPASE, AMYLASE in the last 72 hours. Cardiac Enzymes  Recent Labs  12/17/15 2212 12/18/15 0111 12/18/15 0956  TROPONINI 0.96* 1.01* 0.82*     Telemetry    NSR, HR in 70's - 80's. Frequent pauses up to 1.5 seconds. Some rhythm strips labeled as atrial fibrillation, but p-waves are present.  ECG    No new tracings.   Cardiac Studies and Radiology    Dg Chest 2 View: 12/17/2015  CLINICAL DATA:  This morning he started to have right sided chest pain, patient states he has the pain when his blood  pressure gets high. HX HTN, CAD, Pneumonia, stroke EXAM: CHEST  2 VIEW COMPARISON:  11/08/2015 FINDINGS: Stable enlarged cardiac silhouette. LEFT basilar atelectasis effusion. No edema or infiltrate. No pneumothorax. IMPRESSION: LEFT basilar atelectasis and effusion. Electronically Signed   By: Suzy Bouchard M.D.   On: 12/17/2015 13:35    Echocardiogram: 07/2015 Study Conclusions - Left ventricle: The cavity size was normal. Wall thickness was  increased increased in a pattern of mild to moderate LVH.  Incidentally noted transverse false tendon in LV. Systolic  function was moderately to severely reduced. The estimated  ejection fraction was in the range of 30% to 35%. Diffuse  hypokinesis. There is severe hypokinesis of the  basal-midinferolateral and inferior myocardium. Doppler  parameters are consistent with restrictive physiology, indicative  of decreased left ventricular diastolic compliance and/or  increased left atrial pressure. - Aortic valve: Moderately calcified annulus. Trileaflet; mildly  calcified leaflets. Cusp separation was reduced. There was  moderate stenosis. There was trivial regurgitation. Mean gradient  (S): 16 mm Hg. VTI ratio of LVOT to aortic valve: 0.4. Valve area  (VTI): 1.24 cm^2. Valve area (Vmax): 1.21 cm^2. - Mitral valve: Calcified annulus. There was trivial regurgitation. - Left atrium: The atrium was moderately dilated. - Right atrium: The atrium was moderately to severely dilated.  Central venous pressure (est): 3 mm Hg. - Atrial septum: No defect or patent foramen ovale was identified. - Tricuspid valve: There was mild regurgitation. - Pulmonary arteries: PA peak pressure: 38 mm Hg (S). - Pericardium, extracardiac: There was no pericardial effusion.  Impressions: - Mild to moderate LVH with LVEF approximately 30%, diffuse  hypokinesis, most severe in the mid to basal inferior and  inferolateral wall. Diastolic filling pattern  consistent with  restrictive physiology. Moderate left atrial enlargement.  Moderate calcific aortic stenosis with trivial aortic  regurgitation. Mild tricuspid regurgitation with PASP 38 mmHg.  Moderate to severe right atrial enlargement. Compared to the  previous study from December 2015, there has been further  reduction in LVEF, and progression in degree of aortic stenosis.  Assessment & Plan    1. Atypical Chest Pain/ Elevated Troponin/ History of CAD - developed a sternal chest pain with no associated symptoms, in the setting of his SBP being elevated in the 230's.  - Cardiac catheterization in 07/2014 showed 40-50% proximal LAD, 40% ostial D1, 80% Ramus, and 40% RCA. He was medically managed  at the time. - chest pain resolved upon improvement in his BP. Troponin values have been 0.33, 0.87, 0.95, 1.01, and 0.82 thought to be secondary to demand ischemia in the setting of his hypertensive urgency. Was started on Heparin on 12/18/2015. Will continue for 48 hours (d/c on 12/20/2015). If his symptoms represented despite adequate BP control, could consider further ischemic evaluation at that time.  - continue medical management with ASA, statin, BB, ACE-I, Imdur, and Amlodipine.  2. Hypertensive Urgency - SBP initially elevated into the 230's. Improved to 111/40 - 156/81 in the past 24 hours. - Hydralazine increased to 75mg  Q8H on non-dialysis days with 50mg  on HD days.  - continue Amlodipine, Coreg, Imdur, and Lisinopril. Will decrease his BB dose secondary to sinus pauses.  3. Chronic Combined Systolic and Diastolic CHF - echo in [id number] showed EF of 30-35%. - does not appear volume overloaded on physical exam. - continue BB and ACE-I.  4. ESRD - HD on MWF schedule  5. HLD - continue statin therapy  6. Moderate Aortic Stenosis - followed as an outpatient  7. Sinus Pauses - frequent; up to 1.5 seconds on telemetry.  - will decrease Coreg from 25mg  BID to 12.5mg   BID.  Arna Medici , PA-C 9:03 AM 12/19/2015 Pager: [phone]  I have personally seen and examined this patient with Bernerd Pho, PA-C. I agree with the assessment and plan as outlined above. Elevated SBP likely contributed to his elevated troponin. Continue heparin until tomorrow. Then can stop. Reduce beta blocker dose. No other recs. Will sign off. Please call with questions.   MCALHANY,CHRISTOPHER 12/19/2015 10:42 AM

## 2015-12-20 DIAGNOSIS — I10 Essential (primary) hypertension: Secondary | ICD-10-CM

## 2015-12-20 DIAGNOSIS — D638 Anemia in other chronic diseases classified elsewhere: Secondary | ICD-10-CM

## 2015-12-20 LAB — RENAL FUNCTION PANEL
ALBUMIN: 2.5 g/dL — AB (ref 3.5–5.0)
ANION GAP: 15 (ref 5–15)
BUN: 58 mg/dL — AB (ref 6–20)
CO2: 23 mmol/L (ref 22–32)
Calcium: 9.1 mg/dL (ref 8.9–10.3)
Chloride: 95 mmol/L — ABNORMAL LOW (ref 101–111)
Creatinine, Ser: 8.57 mg/dL — ABNORMAL HIGH (ref 0.61–1.24)
GFR calc Af Amer: 6 mL/min — ABNORMAL LOW (ref >60)
GFR calc non Af Amer: 5 mL/min — ABNORMAL LOW (ref >60)
GLUCOSE: 95 mg/dL (ref 65–99)
POTASSIUM: 5.5 mmol/L — AB (ref 3.5–5.1)
Phosphorus: 8.2 mg/dL — ABNORMAL HIGH (ref 2.5–4.6)
SODIUM: 133 mmol/L — AB (ref 135–145)

## 2015-12-20 LAB — CBC
HCT: 34 % — ABNORMAL LOW (ref 39.0–52.0)
HEMOGLOBIN: 10.6 g/dL — AB (ref 13.0–17.0)
MCH: 29.3 pg (ref 26.0–34.0)
MCHC: 31.2 g/dL (ref 30.0–36.0)
MCV: 93.9 fL (ref 78.0–100.0)
PLATELETS: 234 10*3/uL (ref 150–400)
RBC: 3.62 MIL/uL — AB (ref 4.22–5.81)
RDW: 19.5 % — ABNORMAL HIGH (ref 11.5–15.5)
WBC: 7.3 10*3/uL (ref 4.0–10.5)

## 2015-12-20 MED ORDER — HYDRALAZINE HCL 25 MG PO TABS: 75.0000 mg | ORAL_TABLET | Freq: Three times a day (TID) | ORAL | Status: DC

## 2015-12-20 MED ORDER — HYDRALAZINE HCL 25 MG PO TABS: ORAL_TABLET | ORAL | Status: DC

## 2015-12-20 MED ORDER — HEPARIN SODIUM (PORCINE) 1000 UNIT/ML DIALYSIS: 20.0000 [IU]/kg | INTRAMUSCULAR | Status: DC | PRN

## 2015-12-20 MED ORDER — NITROGLYCERIN 0.4 MG SL SUBL: 0.4000 mg | SUBLINGUAL_TABLET | SUBLINGUAL | Status: AC | PRN

## 2015-12-20 MED ORDER — LISINOPRIL 40 MG PO TABS: 40.0000 mg | ORAL_TABLET | Freq: Every day | ORAL | Status: DC

## 2015-12-20 MED ORDER — CARVEDILOL 12.5 MG PO TABS: 12.5000 mg | ORAL_TABLET | Freq: Two times a day (BID) | ORAL | Status: DC

## 2015-12-20 NOTE — Discharge Summary (Signed)
Physician Discharge Summary   Patient ID: Randy [name] MRN: [id number] DOB/AGE: [birth year] 82 y.o.  Admit date: 12/17/2015 Discharge date: 12/20/2015  Primary Care Physician:  [name], MD  Discharge Diagnoses:    . Chest pain  Hypertensive urgency  . Anemia of chronic disease . CAD (coronary artery disease) . Essential hypertension . Chronic combined systolic and diastolic congestive heart failure [facility])  Consults: Cardiology Nephrology  Recommendations for Outpatient Follow-up:  1. Please note medication changes, hydralazine increased to 75 mg 3 times a day on nonHD days, continue 50 mg 3 times a day on HD days, lisinopril 40 mg at bedtime, Coreg decreased to 12.5 mg twice a day 2. Please repeat CBC/BMET at next visit   DIET: Renal diet    Allergies:  No Known Allergies   DISCHARGE MEDICATIONS: Current Discharge Medication List    START taking these medications   Details  nitroGLYCERIN (NITROSTAT) 0.4 MG SL tablet Place 1 tablet (0.4 mg total) under the tongue every 5 (five) minutes as needed for chest pain. Qty: 30 tablet, Refills: 12      CONTINUE these medications which have CHANGED   Details  carvedilol (COREG) 12.5 MG tablet Take 1 tablet (12.5 mg total) by mouth 2 (two) times daily with a meal. Qty: 60 tablet, Refills: 3    !! hydrALAZINE (APRESOLINE) 25 MG tablet Tke 3 tabs (75mg ) TID (3x /day) on non-dialysis days and 50mg  (2tabs) TID on HD days. Qty: 180 tablet, Refills: 3    lisinopril (PRINIVIL,ZESTRIL) 40 MG tablet Take 1 tablet (40 mg total) by mouth at bedtime. Qty: 30 tablet, Refills: 3     !! - Potential duplicate medications found. Please discuss with provider.    CONTINUE these medications which have NOT CHANGED   Details  amLODipine (NORVASC) 10 MG tablet Take 0.5 tablets (5 mg total) by mouth daily.    aspirin 81 MG tablet Take 81 mg by mouth daily.    atorvastatin (LIPITOR) 80 MG tablet Take 1 tablet (80 mg total) by mouth  daily. Qty: 90 tablet, Refills: 3    calcitRIOL (ROCALTROL) 0.5 MCG capsule Take 1 capsule (0.5 mcg total) by mouth daily. Qty: 30 capsule, Refills: 3    docusate sodium (COLACE) 100 MG capsule Take 2 capsules (200 mg total) by mouth daily. Qty: 10 capsule, Refills: 0    gabapentin (NEURONTIN) 100 MG capsule Take 1 capsule (100 mg total) by mouth at bedtime.    !! hydrALAZINE (APRESOLINE) 50 MG tablet Take 1 tablet (50 mg total) by mouth every 8 (eight) hours. Qty: 90 tablet, Refills: 3    isosorbide mononitrate (IMDUR) 30 MG 24 hr tablet Take 0.5 tablets (15 mg total) by mouth at bedtime. Qty: 45 tablet, Refills: 3    lidocaine-prilocaine (EMLA) cream Apply 1 application topically as needed. For dialysis Refills: 10    LUMIGAN 0.01 % SOLN Place 1 drop into both eyes at bedtime. Refills: 11    multivitamin (RENA-VIT) TABS tablet Take 1 tablet by mouth daily.    oxyCODONE (OXY IR/ROXICODONE) 5 MG immediate release tablet Take 1 tablet (5 mg total) by mouth every 6 (six) hours as needed for moderate pain or severe pain. Qty: 50 tablet, Refills: 0    pantoprazole (PROTONIX) 40 MG tablet Take 1 tablet (40 mg total) by mouth daily at 12 noon.    polyvinyl alcohol (LIQUIFILM TEARS) 1.4 % ophthalmic solution Place 1 drop into both eyes as needed for dry eyes. Qty: 15 mL, Refills:  0    sevelamer carbonate (RENVELA) 800 MG tablet Take 1,600-2,400 mg by mouth See admin instructions. Take 3 tablets (2400 mg) with meals and 2 tablets (1600 mg) with snacks    SIMBRINZA 1-0.2 % SUSP Place 1 drop into both eyes 2 (two) times daily. Refills: 11     !! - Potential duplicate medications found. Please discuss with provider.       Brief H and P: For complete details please refer to admission H and P, but in brief82 y.o. male with a Past Medical History of ESRD on hemodialysis MWF, nonobstructive CAD, history of peripheral vascular disease status post bilateral lower extremity amputee (right  BKA, left AKA), hypertension, chronic systolic heart failure who presented to the ED for evaluation of chest pain and high blood pressure  Hospital Course:   Chest pain  - Likely in the setting of poorly controlled hypertension and hypertensive urgency, chest pain is resolved with improvement in his blood pressure - His troponin has been progressively climbing to greater than 1, he was started on IV heparin 3/27 am and continued on for 48 hours as recommended by cardiology.  - Cardiology following and recommended continue IV heparin for at least 48 hours, no further invasive testing unless significant events, heparin was discontinued on 3/29 AM. No further cardiac workup recommended by cardiology  ESRD  - On hemodialysis MWF.  Nephrology consulted - Underwent hemodialysis 3/27 and 3/29  Anemia of chronic disease  - Continue IV iron/Aranesp per nephrology.   CAD (coronary artery disease) - As above-continue aspirin, statin and Coreg.  - cardiology seeing   hypertensive urgency with fluctuating BP - Hydralazine increased to 75 mg q8h on non-HD days, 50 mg q8hrs on HD days, Imdur 15 mg - Coreg decreased today from 25 twice a day to 12.5 twice a day per cardiology for 1.5 second pauses  Chronic systolic heart failure - Clinically compensated, removal during hemodialysis. Last EF by TTE November 2016 30-35%.  Aortic stenosis  Bilateral lower extremity amputee  Day of Discharge BP 117/65 mmHg  Pulse 78  Temp(Src) 97.6 F (36.4 C) (Oral)  Resp 18  Ht 4' (1.219 m)  Wt 56.2 kg (123 lb 14.4 oz)  BMI 37.82 kg/m2  SpO2 99%  Physical Exam: General: Alert and awake oriented x3 not in any acute distress. HEENT: anicteric sclera, pupils reactive to light and accommodation CVS: S1-S2 clear no murmur rubs or gallops Chest: clear to auscultation bilaterally, no wheezing rales or rhonchi Abdomen: soft nontender, nondistended, normal bowel sounds Extremities: no cyanosis, clubbing ,  bilateral amputee Neuro: Cranial nerves II-XII intact, no focal neurological deficits   The results of significant diagnostics from this hospitalization (including imaging, microbiology, ancillary and laboratory) are listed below for reference.    LAB RESULTS: Basic Metabolic Panel:  Recent Labs Lab 12/19/15 0318 12/20/15 0400  NA 137 133*  K 4.4 5.5*  CL 99* 95*  CO2 24 23  GLUCOSE 87 95  BUN 35* 58*  CREATININE 6.52* 8.57*  CALCIUM 9.3 9.1  PHOS 6.7* 8.2*   Liver Function Tests:  Recent Labs Lab 12/19/15 0318 12/20/15 0400  ALBUMIN 2.6* 2.5*   No results for input(s): LIPASE, AMYLASE in the last 168 hours. No results for input(s): AMMONIA in the last 168 hours. CBC:  Recent Labs Lab 12/17/15 1233  12/19/15 0318 12/20/15 0400  WBC 7.0  < > 6.1 7.3  NEUTROABS 5.6  --   --   --   HGB 11.0*  < >  10.9* 10.6*  HCT 37.5*  < > 36.9* 34.0*  MCV 95.9  < > 94.6 93.9  PLT 253  < > 241 234  < > = values in this interval not displayed. Cardiac Enzymes:  Recent Labs Lab 12/18/15 0111 12/18/15 0956  TROPONINI 1.01* 0.82*   BNP: Invalid input(s): POCBNP CBG: No results for input(s): GLUCAP in the last 168 hours.  Significant Diagnostic Studies:  Dg Chest 2 View  12/17/2015  CLINICAL DATA:  This morning he started to have right sided chest pain, patient states he has the pain when his blood pressure gets high. HX HTN, CAD, Pneumonia, stroke EXAM: CHEST  2 VIEW COMPARISON:  11/08/2015 FINDINGS: Stable enlarged cardiac silhouette. LEFT basilar atelectasis effusion. No edema or infiltrate. No pneumothorax. IMPRESSION: LEFT basilar atelectasis and effusion. Electronically Signed   By: Suzy Bouchard M.D.   On: 12/17/2015 13:35    2D ECHO:   Disposition and Follow-up: Discharge Instructions    Diet - low sodium heart healthy    Complete by:  As directed      Discharge instructions    Complete by:  As directed   Please note the following changes  Hydralazine  increased to 75mg  Q8H on non-dialysis days with 50mg  on HD days. Lisinopril changed to 40 mg at bedtime Coreg has been decreased to 12.5mg  BID (2 x/day)     Increase activity slowly    Complete by:  As directed             DISPOSITION: Home   DISCHARGE FOLLOW-UP Follow-up Information    Follow up with [name], MD. Schedule an appointment as soon as possible for a visit in 2 weeks.   Specialty:  Internal Medicine   Why:  for hospital follow-up   Contact information:   [address] [place] [id number] [phone]        Time spent on Discharge: 69mins   Signed:   RAI,RIPUDEEP M.D. [facility] 12/20/2015, 1:37 PM Pager: [phone]

## 2015-12-20 NOTE — Procedures (Signed)
Patient was seen on dialysis and the procedure was supervised. BFR 400 Via LAVF BP is 143/68.  Patient appears to be tolerating treatment well.

## 2016-01-03 ENCOUNTER — Encounter (HOSPITAL_COMMUNITY): Payer: Medicare Other

## 2016-01-03 ENCOUNTER — Ambulatory Visit: Payer: Medicare Other | Admitting: Vascular Surgery

## 2016-03-03 ENCOUNTER — Inpatient Hospital Stay (HOSPITAL_COMMUNITY)
Admission: EM | Admit: 2016-03-03 | Discharge: 2016-03-14 | DRG: 377 | Disposition: A | Discharge status: Home / Self Care | Payer: Medicare Other | Attending: Internal Medicine | Admitting: Internal Medicine

## 2016-03-03 ENCOUNTER — Encounter (HOSPITAL_COMMUNITY): Payer: Self-pay | Admitting: *Deleted

## 2016-03-03 DIAGNOSIS — K5731 Diverticulosis of large intestine without perforation or abscess with bleeding: Principal | ICD-10-CM | POA: Diagnosis present

## 2016-03-03 DIAGNOSIS — Z89612 Acquired absence of left leg above knee: Secondary | ICD-10-CM

## 2016-03-03 DIAGNOSIS — K449 Diaphragmatic hernia without obstruction or gangrene: Secondary | ICD-10-CM | POA: Diagnosis present

## 2016-03-03 DIAGNOSIS — Z992 Dependence on renal dialysis: Secondary | ICD-10-CM | POA: Diagnosis not present

## 2016-03-03 DIAGNOSIS — Z87891 Personal history of nicotine dependence: Secondary | ICD-10-CM

## 2016-03-03 DIAGNOSIS — Z951 Presence of aortocoronary bypass graft: Secondary | ICD-10-CM

## 2016-03-03 DIAGNOSIS — I959 Hypotension, unspecified: Secondary | ICD-10-CM | POA: Diagnosis present

## 2016-03-03 DIAGNOSIS — Z8673 Personal history of transient ischemic attack (TIA), and cerebral infarction without residual deficits: Secondary | ICD-10-CM | POA: Diagnosis not present

## 2016-03-03 DIAGNOSIS — Z7982 Long term (current) use of aspirin: Secondary | ICD-10-CM

## 2016-03-03 DIAGNOSIS — I248 Other forms of acute ischemic heart disease: Secondary | ICD-10-CM | POA: Diagnosis present

## 2016-03-03 DIAGNOSIS — N186 End stage renal disease: Secondary | ICD-10-CM | POA: Diagnosis not present

## 2016-03-03 DIAGNOSIS — I10 Essential (primary) hypertension: Secondary | ICD-10-CM | POA: Diagnosis present

## 2016-03-03 DIAGNOSIS — K922 Gastrointestinal hemorrhage, unspecified: Secondary | ICD-10-CM

## 2016-03-03 DIAGNOSIS — I120 Hypertensive chronic kidney disease with stage 5 chronic kidney disease or end stage renal disease: Secondary | ICD-10-CM | POA: Diagnosis present

## 2016-03-03 DIAGNOSIS — I700 Atherosclerosis of aorta: Secondary | ICD-10-CM | POA: Diagnosis present

## 2016-03-03 DIAGNOSIS — E8889 Other specified metabolic disorders: Secondary | ICD-10-CM | POA: Diagnosis present

## 2016-03-03 DIAGNOSIS — I25119 Atherosclerotic heart disease of native coronary artery with unspecified angina pectoris: Secondary | ICD-10-CM | POA: Diagnosis not present

## 2016-03-03 DIAGNOSIS — D122 Benign neoplasm of ascending colon: Secondary | ICD-10-CM | POA: Diagnosis present

## 2016-03-03 DIAGNOSIS — E781 Pure hyperglyceridemia: Secondary | ICD-10-CM | POA: Diagnosis present

## 2016-03-03 DIAGNOSIS — Z823 Family history of stroke: Secondary | ICD-10-CM

## 2016-03-03 DIAGNOSIS — Z79899 Other long term (current) drug therapy: Secondary | ICD-10-CM

## 2016-03-03 DIAGNOSIS — K269 Duodenal ulcer, unspecified as acute or chronic, without hemorrhage or perforation: Secondary | ICD-10-CM | POA: Diagnosis not present

## 2016-03-03 DIAGNOSIS — Z89511 Acquired absence of right leg below knee: Secondary | ICD-10-CM

## 2016-03-03 DIAGNOSIS — I739 Peripheral vascular disease, unspecified: Secondary | ICD-10-CM | POA: Diagnosis present

## 2016-03-03 DIAGNOSIS — E7800 Pure hypercholesterolemia, unspecified: Secondary | ICD-10-CM | POA: Diagnosis present

## 2016-03-03 DIAGNOSIS — E785 Hyperlipidemia, unspecified: Secondary | ICD-10-CM | POA: Diagnosis present

## 2016-03-03 DIAGNOSIS — K2970 Gastritis, unspecified, without bleeding: Secondary | ICD-10-CM | POA: Diagnosis not present

## 2016-03-03 DIAGNOSIS — R739 Hyperglycemia, unspecified: Secondary | ICD-10-CM

## 2016-03-03 DIAGNOSIS — M109 Gout, unspecified: Secondary | ICD-10-CM | POA: Diagnosis present

## 2016-03-03 DIAGNOSIS — I255 Ischemic cardiomyopathy: Secondary | ICD-10-CM | POA: Diagnosis not present

## 2016-03-03 DIAGNOSIS — N2581 Secondary hyperparathyroidism of renal origin: Secondary | ICD-10-CM | POA: Diagnosis present

## 2016-03-03 DIAGNOSIS — D62 Acute posthemorrhagic anemia: Secondary | ICD-10-CM | POA: Diagnosis present

## 2016-03-03 DIAGNOSIS — K921 Melena: Secondary | ICD-10-CM | POA: Diagnosis not present

## 2016-03-03 DIAGNOSIS — R079 Chest pain, unspecified: Secondary | ICD-10-CM | POA: Diagnosis not present

## 2016-03-03 DIAGNOSIS — I25118 Atherosclerotic heart disease of native coronary artery with other forms of angina pectoris: Secondary | ICD-10-CM | POA: Diagnosis present

## 2016-03-03 DIAGNOSIS — K625 Hemorrhage of anus and rectum: Secondary | ICD-10-CM | POA: Diagnosis not present

## 2016-03-03 DIAGNOSIS — E876 Hypokalemia: Secondary | ICD-10-CM | POA: Diagnosis present

## 2016-03-03 DIAGNOSIS — I350 Nonrheumatic aortic (valve) stenosis: Secondary | ICD-10-CM | POA: Diagnosis present

## 2016-03-03 DIAGNOSIS — R001 Bradycardia, unspecified: Secondary | ICD-10-CM | POA: Diagnosis not present

## 2016-03-03 DIAGNOSIS — D631 Anemia in chronic kidney disease: Secondary | ICD-10-CM | POA: Diagnosis present

## 2016-03-03 DIAGNOSIS — I2510 Atherosclerotic heart disease of native coronary artery without angina pectoris: Secondary | ICD-10-CM | POA: Diagnosis present

## 2016-03-03 HISTORY — DX: Personal history of transient ischemic attack (TIA), and cerebral infarction without residual deficits: Z86.73

## 2016-03-03 LAB — BASIC METABOLIC PANEL
Anion gap: 13 (ref 5–15)
BUN: 79 mg/dL — AB (ref 6–20)
CHLORIDE: 97 mmol/L — AB (ref 101–111)
CO2: 28 mmol/L (ref 22–32)
CREATININE: 9.16 mg/dL — AB (ref 0.61–1.24)
Calcium: 8.8 mg/dL — ABNORMAL LOW (ref 8.9–10.3)
GFR calc Af Amer: 5 mL/min — ABNORMAL LOW (ref >60)
GFR calc non Af Amer: 5 mL/min — ABNORMAL LOW (ref >60)
GLUCOSE: 138 mg/dL — AB (ref 65–99)
POTASSIUM: 4.3 mmol/L (ref 3.5–5.1)
Sodium: 138 mmol/L (ref 135–145)

## 2016-03-03 LAB — POC OCCULT BLOOD, ED: Fecal Occult Bld: POSITIVE — AB

## 2016-03-03 LAB — CBC WITH DIFFERENTIAL/PLATELET
Basophils Absolute: 0 10*3/uL (ref 0.0–0.1)
Basophils Relative: 0 %
Eosinophils Absolute: 0.3 10*3/uL (ref 0.0–0.7)
Eosinophils Relative: 4 %
HEMATOCRIT: 30.9 % — AB (ref 39.0–52.0)
HEMOGLOBIN: 9.4 g/dL — AB (ref 13.0–17.0)
Lymphocytes Relative: 19 %
Lymphs Abs: 1.1 10*3/uL (ref 0.7–4.0)
MCH: 28 pg (ref 26.0–34.0)
MCHC: 30.4 g/dL (ref 30.0–36.0)
MCV: 92 fL (ref 78.0–100.0)
Monocytes Absolute: 0.8 10*3/uL (ref 0.1–1.0)
Monocytes Relative: 12 %
NEUTROS PCT: 65 %
Neutro Abs: 3.9 10*3/uL (ref 1.7–7.7)
Platelets: 191 10*3/uL (ref 150–400)
RBC: 3.36 MIL/uL — AB (ref 4.22–5.81)
RDW: 19.9 % — ABNORMAL HIGH (ref 11.5–15.5)
WBC: 6.1 10*3/uL (ref 4.0–10.5)

## 2016-03-03 LAB — I-STAT CHEM 8, ED
BUN: 73 mg/dL — AB (ref 6–20)
CALCIUM ION: 1.12 mmol/L — AB (ref 1.13–1.30)
CHLORIDE: 97 mmol/L — AB (ref 101–111)
CREATININE: 9.1 mg/dL — AB (ref 0.61–1.24)
GLUCOSE: 134 mg/dL — AB (ref 65–99)
HCT: 30 % — ABNORMAL LOW (ref 39.0–52.0)
Hemoglobin: 10.2 g/dL — ABNORMAL LOW (ref 13.0–17.0)
Potassium: 4.2 mmol/L (ref 3.5–5.1)
SODIUM: 139 mmol/L (ref 135–145)
TCO2: 28 mmol/L (ref 0–100)

## 2016-03-03 LAB — TROPONIN I: TROPONIN I: 0.09 ng/mL — AB (ref <0.031)

## 2016-03-03 MED ORDER — GABAPENTIN 100 MG PO CAPS
100.0000 mg | ORAL_CAPSULE | Freq: Every evening | ORAL | Status: DC | PRN
  Administered 2016-03-05 – 2016-03-13 (×9): 100 mg via ORAL
  Filled 2016-03-03 (×9): qty 1

## 2016-03-03 MED ORDER — FAMOTIDINE IN NACL 20-0.9 MG/50ML-% IV SOLN: 20.0000 mg | Freq: Two times a day (BID) | INTRAVENOUS | Status: DC

## 2016-03-03 MED ORDER — NON FORMULARY: 1.0000 [drp] | Freq: Two times a day (BID) | Status: DC

## 2016-03-03 MED ORDER — PANTOPRAZOLE SODIUM 40 MG PO TBEC: 40.0000 mg | DELAYED_RELEASE_TABLET | Freq: Every day | ORAL | Status: DC

## 2016-03-03 MED ORDER — ACETAMINOPHEN 325 MG PO TABS
650.0000 mg | ORAL_TABLET | Freq: Four times a day (QID) | ORAL | Status: DC | PRN
  Administered 2016-03-08 – 2016-03-13 (×7): 650 mg via ORAL
  Filled 2016-03-03 (×6): qty 2

## 2016-03-03 MED ORDER — ATORVASTATIN CALCIUM 40 MG PO TABS
80.0000 mg | ORAL_TABLET | Freq: Every day | ORAL | Status: DC
  Administered 2016-03-04 – 2016-03-05 (×2): 80 mg via ORAL
  Filled 2016-03-03 (×2): qty 2

## 2016-03-03 MED ORDER — SODIUM CHLORIDE 0.9 % IV BOLUS (SEPSIS)
500.0000 mL | Freq: Once | INTRAVENOUS | Status: AC
  Administered 2016-03-03: 500 mL via INTRAVENOUS

## 2016-03-03 MED ORDER — CALCITRIOL 0.5 MCG PO CAPS
0.5000 ug | ORAL_CAPSULE | Freq: Every day | ORAL | Status: DC
  Administered 2016-03-04 – 2016-03-05 (×2): 0.5 ug via ORAL
  Filled 2016-03-03: qty 1
  Filled 2016-03-03 (×2): qty 2

## 2016-03-03 MED ORDER — ACETAMINOPHEN 650 MG RE SUPP: 650.0000 mg | Freq: Four times a day (QID) | RECTAL | Status: DC | PRN

## 2016-03-03 MED ORDER — SODIUM CHLORIDE 0.9% FLUSH
3.0000 mL | Freq: Two times a day (BID) | INTRAVENOUS | Status: DC
  Administered 2016-03-03 – 2016-03-14 (×19): 3 mL via INTRAVENOUS

## 2016-03-03 MED ORDER — OXYCODONE HCL 5 MG PO TABS
5.0000 mg | ORAL_TABLET | Freq: Four times a day (QID) | ORAL | Status: DC | PRN
  Administered 2016-03-05 – 2016-03-11 (×6): 5 mg via ORAL
  Filled 2016-03-03 (×4): qty 1

## 2016-03-03 MED ORDER — SEVELAMER CARBONATE 800 MG PO TABS
2400.0000 mg | ORAL_TABLET | Freq: Three times a day (TID) | ORAL | Status: DC
  Administered 2016-03-04 – 2016-03-05 (×2): 2400 mg via ORAL
  Filled 2016-03-03 (×2): qty 3

## 2016-03-03 MED ORDER — FAMOTIDINE IN NACL 20-0.9 MG/50ML-% IV SOLN
20.0000 mg | INTRAVENOUS | Status: DC
Start: 2016-03-04 — End: 2016-03-05
  Administered 2016-03-04: 20 mg via INTRAVENOUS
  Filled 2016-03-03 (×2): qty 50

## 2016-03-03 MED ORDER — LATANOPROST 0.005 % OP SOLN
1.0000 [drp] | Freq: Every day | OPHTHALMIC | Status: DC
  Administered 2016-03-04 – 2016-03-13 (×9): 1 [drp] via OPHTHALMIC
  Filled 2016-03-03 (×3): qty 2.5

## 2016-03-03 MED ORDER — POLYVINYL ALCOHOL 1.4 % OP SOLN
1.0000 [drp] | OPHTHALMIC | Status: DC | PRN
  Administered 2016-03-13: 1 [drp] via OPHTHALMIC
  Filled 2016-03-03 (×3): qty 15

## 2016-03-03 MED ORDER — SODIUM CHLORIDE 0.9 % IV SOLN
INTRAVENOUS | Status: AC
  Administered 2016-03-03: 22:00:00 via INTRAVENOUS

## 2016-03-03 MED ORDER — SEVELAMER CARBONATE 800 MG PO TABS
1600.0000 mg | ORAL_TABLET | ORAL | Status: DC
  Administered 2016-03-04: 1600 mg via ORAL
  Filled 2016-03-03 (×2): qty 2

## 2016-03-03 MED ORDER — RENA-VITE PO TABS
1.0000 | ORAL_TABLET | Freq: Every day | ORAL | Status: DC
  Administered 2016-03-04 – 2016-03-14 (×11): 1 via ORAL
  Filled 2016-03-03 (×11): qty 1

## 2016-03-03 MED ORDER — ASPIRIN EC 81 MG PO TBEC
81.0000 mg | DELAYED_RELEASE_TABLET | Freq: Every day | ORAL | Status: DC
  Administered 2016-03-04 – 2016-03-05 (×2): 81 mg via ORAL
  Filled 2016-03-03 (×2): qty 1

## 2016-03-03 NOTE — ED Provider Notes (Signed)
CSN: [id number]     Arrival date & time 03/03/16  1941 History  By signing my name below, I, Randy [name], attest that this documentation has been prepared under the direction and in the presence of [name], MD. Electronically Signed: Hansel [name], ED Scribe. 03/03/2016. 8:11 PM.    Chief Complaint  Patient presents with  . Rectal Bleeding   Patient is a 82 y.o. male presenting with hematochezia. The history is provided by the patient. No language interpreter was used.  Rectal Bleeding Quality:  Bright red Amount:  Moderate Duration:  2 hours Timing:  Intermittent Progression:  Unchanged Chronicity:  Recurrent Context: not spontaneously   Similar prior episodes: yes   Relieved by:  None tried Worsened by:  Nothing tried Ineffective treatments:  None tried Associated symptoms: no abdominal pain and no fever   Risk factors: no hx of colorectal cancer    HPI Comments: Randy [name] is a 82 y.o. male with h/o NICM, HTN, CAD, ESRD on hemodialysis who presents to the Emergency Department complaining of episodes of rectal bleeding beginning with bright red blood onset 2 hours ago. No worsening or alleviating factors noted.  Pt reports h/o similar symptoms on 09/21/2014 and was admitted to the hospital for lower GI bleed. On record review, his colonoscopy was unrevealing and he received several blood transfusions while admitted. Pt is dialyzed MWF and reports he has not missed any appointments. He denies abdominal pain, fever.   Past Medical History  Diagnosis Date  . Hypercholesterolemia   . Gout   . Nonischemic cardiomyopathy (HCC)     LVEF 35-40%  . Anemia of chronic disease   . Essential hypertension   . Arthritis   . Peripheral vascular disease ([place])     Status post right below knee amputation for a nonhealing wound of the right foot 12/2014  . History of pneumonia 2014  . CAD (coronary artery disease)     Moderate multivessel disease 07/2015 - managed medically  . History of  myopericarditis 2015    07/2015  . Stroke [facility])     "they said I did"  "I did not know anything about it"  . ESRD on hemodialysis [facility])     M/W/F in [place] - Dr. Florene Glen  . Constipation   . History of blood transfusion   . Pneumonia   . Acute myopericarditis 2015  . Chest pain 11/2015   Past Surgical History  Procedure Laterality Date  . Knee arthroscopy Right 2007  . Back surgery    . Hemorrhoid surgery  1970's  . Ganglion cyst excision  01/03/2012    Procedure: REMOVAL GANGLION OF WRIST;  Surgeon: Scherry Ran, MD;  Location: [facility];  Service: General;  Laterality: Right;  . Colonoscopy    . Av fistula placement  08/24/2012    Procedure: ARTERIOVENOUS (AV) FISTULA CREATION;  Surgeon: Rosetta Posner, MD;  Location: [place];  Service: Vascular;  Laterality: Left;  . Insertion of dialysis catheter Right      neck  . Insertion of dialysis catheter  10/19/2012    Procedure: INSERTION OF DIALYSIS CATHETER;  Surgeon: Rosetta Posner, MD;  Location: [place];  Service: Vascular;  Laterality: N/A;  REMOVE TEMPORARY CATH  . Cholecystectomy    . Lumbar disc surgery  2004  . Left heart catheterization with coronary angiogram N/A 08/17/2014    Procedure: LEFT HEART CATHETERIZATION WITH CORONARY ANGIOGRAM;  Surgeon: Larey Dresser, MD;  Location: [facility] CATH LAB;  Service: Cardiovascular;  Laterality: N/A;  . Colonoscopy Left 09/26/2014    Procedure: COLONOSCOPY;  Surgeon: Arta Silence, MD;  Location: [facility] ENDOSCOPY;  Service: Endoscopy;  Laterality: Left;  . Amputation Right 01/05/2015    Procedure: AMPUTATION BELOW KNEE;  Surgeon: Angelia Mould, MD;  Location: [place];  Service: Vascular;  Laterality: Right;  . Capd removal N/A 03/13/2015    Procedure: CONTINUOUS AMBULATORY PERITONEAL DIALYSIS  (CAPD) CATHETER REMOVAL;  Surgeon: Coralie Keens, MD;  Location: [place];  Service: General;  Laterality: N/A;  . Cardiac catheterization    . Above knee leg amputation Left 10/10/2015  . Amputation Left  10/10/2015    Procedure: AMPUTATION ABOVE KNEE LEFT;  Surgeon: Angelia Mould, MD;  Location[facility] OR;  Service: Vascular;  Laterality: Left;   Family History  Problem Relation Age of Onset  . Arthritis    . Cancer    . Kidney disease    . Anesthesia problems Neg Hx   . Hypotension Neg Hx   . Malignant hyperthermia Neg Hx   . Pseudochol deficiency Neg Hx   . Cancer Sister   . Colon cancer Brother   . Colon cancer Brother    Social History  Substance Use Topics  . Smoking status: Former Smoker -- 1.00 packs/day for 30 years    Types: Cigarettes    Quit date: 08/19/1998  . Smokeless tobacco: Former Systems developer    Types: Chew    Quit date: 12/31/1993  . Alcohol Use: No    Review of Systems  Constitutional: Negative for fever, appetite change and fatigue.  HENT: Negative for congestion, ear discharge and sinus pressure.   Eyes: Negative for discharge.  Respiratory: Negative for cough.   Cardiovascular: Negative for chest pain.  Gastrointestinal: Positive for hematochezia. Negative for abdominal pain and diarrhea.  Genitourinary: Negative for frequency and hematuria.  Musculoskeletal: Negative for back pain.  Skin: Negative for rash.  Neurological: Negative for seizures and headaches.  Psychiatric/Behavioral: Negative for hallucinations.   Allergies  Review of patient's allergies indicates no known allergies.  Home Medications   Prior to Admission medications   Medication Sig Start Date End Date Taking? Authorizing Provider  amLODipine (NORVASC) 10 MG tablet Take 0.5 tablets (5 mg total) by mouth daily. 10/31/15   Bary Leriche, PA-C  aspirin 81 MG tablet Take 81 mg by mouth daily.    Historical Provider, MD  atorvastatin (LIPITOR) 80 MG tablet Take 1 tablet (80 mg total) by mouth daily. 11/02/15   Arnoldo Lenis, MD  calcitRIOL (ROCALTROL) 0.5 MCG capsule Take 1 capsule (0.5 mcg total) by mouth daily. 11/10/15   Lucia Gaskins, MD  carvedilol (COREG) 12.5 MG tablet Take 1  tablet (12.5 mg total) by mouth 2 (two) times daily with a meal. 12/20/15   Ripudeep Krystal Eaton, MD  docusate sodium (COLACE) 100 MG capsule Take 2 capsules (200 mg total) by mouth daily. 10/31/15   Bary Leriche, PA-C  gabapentin (NEURONTIN) 100 MG capsule Take 1 capsule (100 mg total) by mouth at bedtime. Patient taking differently: Take 100 mg by mouth at bedtime as needed (for neuropathy).  10/23/15   Barton Dubois, MD  hydrALAZINE (APRESOLINE) 25 MG tablet Tke 3 tabs (75mg ) TID (3x /day) on non-dialysis days and 50mg  (2tabs) TID on HD days. 12/20/15   Ripudeep Krystal Eaton, MD  hydrALAZINE (APRESOLINE) 50 MG tablet Take 1 tablet (50 mg total) by mouth every 8 (eight) hours. 11/10/15   Lucia Gaskins, MD  isosorbide mononitrate (IMDUR) 30 MG 24 hr tablet Take 0.5 tablets (15 mg total) by mouth at bedtime. 09/20/15   Arnoldo Lenis, MD  lidocaine-prilocaine (EMLA) cream Apply 1 application topically as needed. For dialysis 10/06/15   Historical Provider, MD  lisinopril (PRINIVIL,ZESTRIL) 40 MG tablet Take 1 tablet (40 mg total) by mouth at bedtime. 12/20/15   Ripudeep K Rai, MD  LUMIGAN 0.01 % SOLN Place 1 drop into both eyes at bedtime. 09/18/15   Historical Provider, MD  multivitamin (RENA-VIT) TABS tablet Take 1 tablet by mouth daily.    Historical Provider, MD  nitroGLYCERIN (NITROSTAT) 0.4 MG SL tablet Place 1 tablet (0.4 mg total) under the tongue every 5 (five) minutes as needed for chest pain. 12/20/15   Ripudeep Krystal Eaton, MD  oxyCODONE (OXY IR/ROXICODONE) 5 MG immediate release tablet Take 1 tablet (5 mg total) by mouth every 6 (six) hours as needed for moderate pain or severe pain. 10/31/15   Ivan Anchors Love, PA-C  pantoprazole (PROTONIX) 40 MG tablet Take 1 tablet (40 mg total) by mouth daily at 12 noon. 10/23/15   Barton Dubois, MD  polyvinyl alcohol (LIQUIFILM TEARS) 1.4 % ophthalmic solution Place 1 drop into both eyes as needed for dry eyes. 10/31/15   Bary Leriche, PA-C  sevelamer carbonate (RENVELA) 800 MG  tablet Take 1,600-2,400 mg by mouth See admin instructions. Take 3 tablets (2400 mg) with meals and 2 tablets (1600 mg) with snacks    Historical Provider, MD  SIMBRINZA 1-0.2 % SUSP Place 1 drop into both eyes 2 (two) times daily. 09/18/15   Historical Provider, MD   BP 66/47 mmHg  Pulse 72  Temp(Src) 97.4 F (36.3 C) (Oral)  Resp 20  Wt 123 lb 14 oz (56.189 kg)  SpO2 97% Physical Exam  Constitutional: He is oriented to person, place, and time. He appears well-developed.  HENT:  Head: Normocephalic.  Eyes: Conjunctivae and EOM are normal. No scleral icterus.  Neck: Neck supple. No thyromegaly present.  Cardiovascular: Normal rate and regular rhythm.  Exam reveals no gallop and no friction rub.   No murmur heard. Pulmonary/Chest: No stridor. He has no wheezes. He has no rales. He exhibits no tenderness.  Abdominal: He exhibits no distension. There is no tenderness. There is no rebound.  Genitourinary:  Red heme positive blood. Chaperone present throughout entire exam.    Musculoskeletal: Normal range of motion. He exhibits no edema.  Left BKA; Right AKA.   Lymphadenopathy:    He has no cervical adenopathy.  Neurological: He is oriented to person, place, and time. He exhibits normal muscle tone. Coordination normal.  Skin: No rash noted. No erythema.  Psychiatric: He has a normal mood and affect. His behavior is normal.  Nursing note and vitals reviewed.   ED Course  Procedures (including critical care time) DIAGNOSTIC STUDIES: Oxygen Saturation is 96% on RA, adequate by my interpretation.    COORDINATION OF CARE: 8:02 PM Discussed treatment plan with pt at bedside which includes lab work and pt agreed to plan.   Labs Review Labs Reviewed  CBC WITH DIFFERENTIAL/PLATELET  BASIC METABOLIC PANEL  I-STAT CHEM 8, ED  TYPE AND SCREEN    Imaging Review No results found. I have personally reviewed and evaluated these images and lab results as part of my medical  decision-making.   EKG Interpretation None     CRITICAL CARE Performed by: Shaquoya Cosper L Total critical care time: 40 minutes Critical care time was exclusive of separately  billable procedures and treating other patients. Critical care was necessary to treat or prevent imminent or life-threatening deterioration. Critical care was time spent personally by me on the following activities: development of treatment plan with patient and/or surrogate as well as nursing, discussions with consultants, evaluation of patient's response to treatment, examination of patient, obtaining history from patient or surrogate, ordering and performing treatments and interventions, ordering and review of laboratory studies, ordering and review of radiographic studies, pulse oximetry and re-evaluation of patient's condition.   MDM   Final diagnoses:  None   Patient with lower GI bleed. He will be admitted to stepdown and nephrology will be consult was for his dialysis.  The chart was scribed for me under my direct supervision.  I personally performed the history, physical, and medical decision making and all procedures in the evaluation of this patient.[name], MD 03/03/16 2120

## 2016-03-03 NOTE — ED Notes (Addendum)
Answered call light for [facility] restroom. Found patient unresponsive about to fall off of toilet. I observed patient having what looked to be a seizure for about 20 seconds. Patient had bloody stool in toilet. Patient was aroused and began vomiting. Nurse tech and I were able to get him into a wheel chair and on a stretcher. Patient stated that he was very weak. Patient was taken to a room.

## 2016-03-03 NOTE — H&P (Addendum)
TRH H&P   Patient Demographics:    Randy [name], is a 82 y.o. male  MRN: [id number]   DOB - 30-Jun-1932  Admit Date - 03/03/2016  Outpatient Primary MD for the patient is [name], MD  Referring MD/NP/PA: Pilar Jarvis  Outpatient Specialists:   Tamala Julian (GI?), Erling Cruz (nephrology)  Patient coming from: home  Chief Complaint  Patient presents with  . Rectal Bleeding      HPI:    Randy [name]  is a 82 y.o. male, with ESRD on HD (M, W, F) , Hypertension, Anemia, apparently c/o bleeding per rectum starting this afternoon.  Pt states that there was blood on the toilet paper and the toilet bowel.  +n/v x1,  No blood.  Denies fever, chills, abd pain, diarrhea, black stool.      Review of systems:    In addition to the HPI above, No Fever-chills, No Headache, No changes with Vision or hearing, No problems swallowing food or Liquids, No Chest pain, Cough or Shortness of Breath, No Abdominal pain, No Nausea or Vommitting, Bowel movements are regular, No blood in Urine, No dysuria, No new skin rashes or bruises, No new joints pains-aches,  No new weakness, tingling, numbness in any extremity, No recent weight gain or loss, No polyuria, polydypsia or polyphagia, No significant Mental Stressors.  A full 10 point Review of Systems was done, except as stated above, all other Review of Systems were negative.   With Past History of the following :    Past Medical History  Diagnosis Date  . Hypercholesterolemia   . Gout   . Nonischemic cardiomyopathy (HCC)     LVEF 35-40%  . Anemia of chronic disease   . Essential hypertension   . Arthritis   . Peripheral vascular disease ([place])     Status post right below knee amputation for a nonhealing wound of the right foot 12/2014  . History of pneumonia 2014  . CAD (coronary artery disease)     Moderate multivessel  disease 07/2015 - managed medically  . History of myopericarditis 2015    07/2015  . Stroke [facility])     "they said I did"  "I did not know anything about it"  . ESRD on hemodialysis [facility])     M/W/F in [place] - Dr. Florene Glen  . Constipation   . History of blood transfusion   . Pneumonia   . Acute myopericarditis 2015  . Chest pain 11/2015      Past Surgical History  Procedure Laterality Date  . Knee arthroscopy Right 2007  . Back surgery    . Hemorrhoid surgery  1970's  . Ganglion cyst excision  01/03/2012    Procedure: REMOVAL GANGLION OF WRIST;  Surgeon: Scherry Ran, MD;  Location: [facility];  Service: General;  Laterality: Right;  . Colonoscopy    . Av fistula placement  08/24/2012  Procedure: ARTERIOVENOUS (AV) FISTULA CREATION;  Surgeon: Rosetta Posner, MD;  Location: [place];  Service: Vascular;  Laterality: Left;  . Insertion of dialysis catheter Right      neck  . Insertion of dialysis catheter  10/19/2012    Procedure: INSERTION OF DIALYSIS CATHETER;  Surgeon: Rosetta Posner, MD;  Location: [place];  Service: Vascular;  Laterality: N/A;  REMOVE TEMPORARY CATH  . Cholecystectomy    . Lumbar disc surgery  2004  . Left heart catheterization with coronary angiogram N/A 08/17/2014    Procedure: LEFT HEART CATHETERIZATION WITH CORONARY ANGIOGRAM;  Surgeon: Larey Dresser, MD;  Location: [facility] CATH LAB;  Service: Cardiovascular;  Laterality: N/A;  . Colonoscopy Left 09/26/2014    Procedure: COLONOSCOPY;  Surgeon: Arta Silence, MD;  Location: [facility] ENDOSCOPY;  Service: Endoscopy;  Laterality: Left;  . Amputation Right 01/05/2015    Procedure: AMPUTATION BELOW KNEE;  Surgeon: Angelia Mould, MD;  Location: [place];  Service: Vascular;  Laterality: Right;  . Capd removal N/A 03/13/2015    Procedure: CONTINUOUS AMBULATORY PERITONEAL DIALYSIS  (CAPD) CATHETER REMOVAL;  Surgeon: Coralie Keens, MD;  Location: [place];  Service: General;  Laterality: N/A;  . Cardiac catheterization    . Above  knee leg amputation Left 10/10/2015  . Amputation Left 10/10/2015    Procedure: AMPUTATION ABOVE KNEE LEFT;  Surgeon: Angelia Mould, MD;  Location: [facility] OR;  Service: Vascular;  Laterality: Left;      Social History:     Social History  Substance Use Topics  . Smoking status: Former Smoker -- 1.00 packs/day for 30 years    Types: Cigarettes    Quit date: 08/19/1998  . Smokeless tobacco: Former Systems developer    Types: Chew    Quit date: 12/31/1993  . Alcohol Use: No     Lives - at home  Mobility - doesn't walk   Family History :     Family History  Problem Relation Age of Onset  . Arthritis    . Cancer    . Kidney disease    . Anesthesia problems Neg Hx   . Hypotension Neg Hx   . Malignant hyperthermia Neg Hx   . Pseudochol deficiency Neg Hx   . Cancer Sister   . Colon cancer Brother   . Colon cancer Brother       Home Medications:   Prior to Admission medications   Medication Sig Start Date End Date Taking? Authorizing Provider  amLODipine (NORVASC) 10 MG tablet Take 0.5 tablets (5 mg total) by mouth daily. 10/31/15   Bary Leriche, PA-C  aspirin 81 MG tablet Take 81 mg by mouth daily.    Historical Provider, MD  atorvastatin (LIPITOR) 80 MG tablet Take 1 tablet (80 mg total) by mouth daily. 11/02/15   Arnoldo Lenis, MD  calcitRIOL (ROCALTROL) 0.5 MCG capsule Take 1 capsule (0.5 mcg total) by mouth daily. 11/10/15   Lucia Gaskins, MD  carvedilol (COREG) 12.5 MG tablet Take 1 tablet (12.5 mg total) by mouth 2 (two) times daily with a meal. 12/20/15   Ripudeep Krystal Eaton, MD  docusate sodium (COLACE) 100 MG capsule Take 2 capsules (200 mg total) by mouth daily. 10/31/15   Bary Leriche, PA-C  gabapentin (NEURONTIN) 100 MG capsule Take 1 capsule (100 mg total) by mouth at bedtime. Patient taking differently: Take 100 mg by mouth at bedtime as needed (for neuropathy).  10/23/15   Barton Dubois, MD  hydrALAZINE (APRESOLINE) 25 MG  tablet Tke 3 tabs (75mg ) TID (3x /day) on  non-dialysis days and 50mg  (2tabs) TID on HD days. 12/20/15   Ripudeep Krystal Eaton, MD  hydrALAZINE (APRESOLINE) 50 MG tablet Take 1 tablet (50 mg total) by mouth every 8 (eight) hours. 11/10/15   Lucia Gaskins, MD  isosorbide mononitrate (IMDUR) 30 MG 24 hr tablet Take 0.5 tablets (15 mg total) by mouth at bedtime. 09/20/15   Arnoldo Lenis, MD  lidocaine-prilocaine (EMLA) cream Apply 1 application topically as needed. For dialysis 10/06/15   Historical Provider, MD  lisinopril (PRINIVIL,ZESTRIL) 40 MG tablet Take 1 tablet (40 mg total) by mouth at bedtime. 12/20/15   Ripudeep K Rai, MD  LUMIGAN 0.01 % SOLN Place 1 drop into both eyes at bedtime. 09/18/15   Historical Provider, MD  multivitamin (RENA-VIT) TABS tablet Take 1 tablet by mouth daily.    Historical Provider, MD  nitroGLYCERIN (NITROSTAT) 0.4 MG SL tablet Place 1 tablet (0.4 mg total) under the tongue every 5 (five) minutes as needed for chest pain. 12/20/15   Ripudeep Krystal Eaton, MD  oxyCODONE (OXY IR/ROXICODONE) 5 MG immediate release tablet Take 1 tablet (5 mg total) by mouth every 6 (six) hours as needed for moderate pain or severe pain. 10/31/15   Ivan Anchors Love, PA-C  pantoprazole (PROTONIX) 40 MG tablet Take 1 tablet (40 mg total) by mouth daily at 12 noon. 10/23/15   Barton Dubois, MD  polyvinyl alcohol (LIQUIFILM TEARS) 1.4 % ophthalmic solution Place 1 drop into both eyes as needed for dry eyes. 10/31/15   Bary Leriche, PA-C  sevelamer carbonate (RENVELA) 800 MG tablet Take 1,600-2,400 mg by mouth See admin instructions. Take 3 tablets (2400 mg) with meals and 2 tablets (1600 mg) with snacks    Historical Provider, MD  SIMBRINZA 1-0.2 % SUSP Place 1 drop into both eyes 2 (two) times daily. 09/18/15   Historical Provider, MD     Allergies:    No Known Allergies   Physical Exam:   Vitals  Blood pressure 119/57, pulse 66, temperature 97.4 F (36.3 C), temperature source Oral, resp. rate 20, weight 56.189 kg (123 lb 14 oz), SpO2 97  %.   1. General  lying in bed in NAD,   2. Normal affect and insight, Not Suicidal or Homicidal, Awake Alert, Oriented X 3.  3. No F.N deficits, ALL C.Nerves Intact, Strength 5/5 all 4 extremities, Sensation intact all 4 extremities, Plantars down going.  4. Ears and Eyes appear Normal, Conjunctivae clear, PERRLA. Moist Oral Mucosa.  5. Supple Neck, No JVD, No cervical lymphadenopathy appriciated, No Carotid Bruits.  6. Symmetrical Chest wall movement, Good air movement bilaterally, CTAB.  7. RRR, No Gallops, Rubs 2/6 sem rusb, No Parasternal Heave.  8. Positive Bowel Sounds, Abdomen Soft, No tenderness, No organomegaly appriciated,No rebound -guarding or rigidity.  9.  No Cyanosis, Normal Skin Turgor, No Skin Rash or Bruise.  10. Good muscle tone,  joints appear normal , no effusions, Normal ROM.  11. No Palpable Lymph Nodes in Neck or Axillae  L AVF, R bka,  L aka     Data Review:    CBC  Recent Labs Lab 03/03/16 2002 03/03/16 2030  WBC 6.1  --   HGB 9.4* 10.2*  HCT 30.9* 30.0*  PLT 191  --   MCV 92.0  --   MCH 28.0  --   MCHC 30.4  --   RDW 19.9*  --   LYMPHSABS 1.1  --  MONOABS 0.8  --   EOSABS 0.3  --   BASOSABS 0.0  --    ------------------------------------------------------------------------------------------------------------------  Chemistries   Recent Labs Lab 03/03/16 2002 03/03/16 2030  NA 138 139  K 4.3 4.2  CL 97* 97*  CO2 28  --   GLUCOSE 138* 134*  BUN 79* 73*  CREATININE 9.16* 9.10*  CALCIUM 8.8*  --    ------------------------------------------------------------------------------------------------------------------ estimated creatinine clearance is 3.2 mL/min (by C-G formula based on Cr of 9.1). ------------------------------------------------------------------------------------------------------------------ No results for input(s): TSH, T4TOTAL, T3FREE, THYROIDAB in the last 72 hours.  Invalid input(s): FREET3  Coagulation  profile No results for input(s): INR, PROTIME in the last 168 hours. ------------------------------------------------------------------------------------------------------------------- No results for input(s): DDIMER in the last 72 hours. -------------------------------------------------------------------------------------------------------------------  Cardiac Enzymes No results for input(s): CKMB, TROPONINI, MYOGLOBIN in the last 168 hours.  Invalid input(s): CK ------------------------------------------------------------------------------------------------------------------    Component Value Date/Time   BNP 3090.9* 10/21/2015 0936     ---------------------------------------------------------------------------------------------------------------  Urinalysis    Component Value Date/Time   COLORURINE YELLOW 06/22/2014 0531   APPEARANCEUR CLEAR 06/22/2014 0531   LABSPEC 1.015 06/22/2014 0531   PHURINE 6.0 06/22/2014 0531   GLUCOSEU 100* 06/22/2014 0531   HGBUR SMALL* 06/22/2014 0531   HGBUR trace-lysed 06/03/2008 0855   BILIRUBINUR NEGATIVE 06/22/2014 0531   KETONESUR TRACE* 06/22/2014 0531   PROTEINUR 100* 06/22/2014 0531   UROBILINOGEN 0.2 06/22/2014 0531   NITRITE NEGATIVE 06/22/2014 0531   LEUKOCYTESUR NEGATIVE 06/22/2014 0531    ----------------------------------------------------------------------------------------------------------------   Imaging Results:    No results found.    Assessment & Plan:    Active Problems:   ESRD on dialysis [facility])   Essential hypertension   GI bleed   Hyperglycemia    1. GI bleeding NPO Type and screen ,  Serial cbc protonix 40mg  iv bid  => pepcid iv due to national shortage Gi consultation  2. ESRD on HD, M, W, F Consult nephrology done by ED  3. Anemia Serial cbc  4. Hyperglycemia Check hga1c  5.  Hypotension Check trop i q6hx 3.   6. Hypertension Hold bp medications due to hypotension     DVT  Prophylaxis SCDs  AM Labs Ordered, also please review Full Orders  Family Communication: Admission, patients condition and plan of care including tests being ordered have been discussed with the patient  who indicate understanding and agree with the plan and Code Status.  Code Status FULL CODE  Likely DC to    Condition GUARDED    Consults called: per ED nephrology, please call GI in am  Admission status:inpatient  Time spent in minutes : 45 minutes   Jani Gravel M.D on 03/03/2016 at 9:27 PM  Between 7am to 7pm - Pager - [phone]. After 7pm go to [email or web address] - password [facility]  [facility] - Office  [phone]

## 2016-03-03 NOTE — ED Notes (Signed)
Pt c/o bright red rectal bleeding that started this afternoon with n/v, denies any pain,

## 2016-03-04 ENCOUNTER — Encounter (HOSPITAL_COMMUNITY): Payer: Self-pay | Admitting: Gastroenterology

## 2016-03-04 DIAGNOSIS — I10 Essential (primary) hypertension: Secondary | ICD-10-CM

## 2016-03-04 DIAGNOSIS — E785 Hyperlipidemia, unspecified: Secondary | ICD-10-CM | POA: Diagnosis present

## 2016-03-04 DIAGNOSIS — Z992 Dependence on renal dialysis: Secondary | ICD-10-CM

## 2016-03-04 DIAGNOSIS — N186 End stage renal disease: Secondary | ICD-10-CM

## 2016-03-04 DIAGNOSIS — I255 Ischemic cardiomyopathy: Secondary | ICD-10-CM

## 2016-03-04 DIAGNOSIS — I25119 Atherosclerotic heart disease of native coronary artery with unspecified angina pectoris: Secondary | ICD-10-CM | POA: Diagnosis present

## 2016-03-04 LAB — CBC
HEMATOCRIT: 22.8 % — AB (ref 39.0–52.0)
HEMOGLOBIN: 6.9 g/dL — AB (ref 13.0–17.0)
MCH: 28 pg (ref 26.0–34.0)
MCHC: 30.7 g/dL (ref 30.0–36.0)
MCV: 91.2 fL (ref 78.0–100.0)
Platelets: 165 10*3/uL (ref 150–400)
RBC: 2.5 MIL/uL — AB (ref 4.22–5.81)
RDW: 19.9 % — ABNORMAL HIGH (ref 11.5–15.5)
WBC: 5.2 10*3/uL (ref 4.0–10.5)

## 2016-03-04 LAB — HEMOGLOBIN AND HEMATOCRIT, BLOOD
HCT: 28 % — ABNORMAL LOW (ref 39.0–52.0)
HEMATOCRIT: 30.3 % — AB (ref 39.0–52.0)
HEMOGLOBIN: 9.6 g/dL — AB (ref 13.0–17.0)
Hemoglobin: 9 g/dL — ABNORMAL LOW (ref 13.0–17.0)

## 2016-03-04 LAB — TROPONIN I
TROPONIN I: 0.1 ng/mL — AB (ref <0.031)
TROPONIN I: 0.11 ng/mL — AB (ref <0.031)

## 2016-03-04 LAB — PREPARE RBC (CROSSMATCH)

## 2016-03-04 LAB — COMPREHENSIVE METABOLIC PANEL
ALT: 8 U/L — AB (ref 17–63)
ANION GAP: 11 (ref 5–15)
AST: 12 U/L — AB (ref 15–41)
Albumin: 2.4 g/dL — ABNORMAL LOW (ref 3.5–5.0)
Alkaline Phosphatase: 53 U/L (ref 38–126)
BILIRUBIN TOTAL: 0.5 mg/dL (ref 0.3–1.2)
BUN: 83 mg/dL — ABNORMAL HIGH (ref 6–20)
CO2: 26 mmol/L (ref 22–32)
Calcium: 8.3 mg/dL — ABNORMAL LOW (ref 8.9–10.3)
Chloride: 101 mmol/L (ref 101–111)
Creatinine, Ser: 9.31 mg/dL — ABNORMAL HIGH (ref 0.61–1.24)
GFR calc Af Amer: 5 mL/min — ABNORMAL LOW (ref >60)
GFR calc non Af Amer: 5 mL/min — ABNORMAL LOW (ref >60)
GLUCOSE: 141 mg/dL — AB (ref 65–99)
POTASSIUM: 4.5 mmol/L (ref 3.5–5.1)
SODIUM: 138 mmol/L (ref 135–145)
TOTAL PROTEIN: 5.1 g/dL — AB (ref 6.5–8.1)

## 2016-03-04 LAB — MRSA PCR SCREENING: MRSA by PCR: NEGATIVE

## 2016-03-04 MED ORDER — BRIMONIDINE TARTRATE 0.2 % OP SOLN
1.0000 [drp] | Freq: Two times a day (BID) | OPHTHALMIC | Status: DC
  Administered 2016-03-04 – 2016-03-14 (×19): 1 [drp] via OPHTHALMIC
  Filled 2016-03-04 (×3): qty 5

## 2016-03-04 MED ORDER — BRINZOLAMIDE 1 % OP SUSP
1.0000 [drp] | Freq: Two times a day (BID) | OPHTHALMIC | Status: DC
  Administered 2016-03-04 – 2016-03-14 (×19): 1 [drp] via OPHTHALMIC
  Filled 2016-03-04 (×3): qty 10

## 2016-03-04 MED ORDER — AMLODIPINE BESYLATE 5 MG PO TABS
5.0000 mg | ORAL_TABLET | Freq: Every day | ORAL | Status: DC
  Administered 2016-03-04 – 2016-03-12 (×7): 5 mg via ORAL
  Filled 2016-03-04 (×8): qty 1

## 2016-03-04 MED ORDER — PENTAFLUOROPROP-TETRAFLUOROETH EX AERO
1.0000 "application " | INHALATION_SPRAY | CUTANEOUS | Status: DC | PRN
  Filled 2016-03-04: qty 30

## 2016-03-04 MED ORDER — LIDOCAINE HCL (PF) 1 % IJ SOLN: 5.0000 mL | INTRAMUSCULAR | Status: DC | PRN

## 2016-03-04 MED ORDER — CARVEDILOL 12.5 MG PO TABS
12.5000 mg | ORAL_TABLET | Freq: Two times a day (BID) | ORAL | Status: DC
  Administered 2016-03-04 – 2016-03-09 (×6): 12.5 mg via ORAL
  Filled 2016-03-04 (×6): qty 1

## 2016-03-04 MED ORDER — SODIUM CHLORIDE 0.9 % IV SOLN
100.0000 mL | INTRAVENOUS | Status: DC | PRN
Start: 2016-03-04 — End: 2016-03-07

## 2016-03-04 MED ORDER — SODIUM CHLORIDE 0.9 % IV SOLN: 100.0000 mL | INTRAVENOUS | Status: DC | PRN

## 2016-03-04 MED ORDER — LIDOCAINE-PRILOCAINE 2.5-2.5 % EX CREA
1.0000 "application " | TOPICAL_CREAM | CUTANEOUS | Status: DC | PRN
  Filled 2016-03-04: qty 5

## 2016-03-04 MED ORDER — ISOSORBIDE MONONITRATE ER 30 MG PO TB24
15.0000 mg | ORAL_TABLET | Freq: Every day | ORAL | Status: DC
  Administered 2016-03-04 – 2016-03-05 (×2): 15 mg via ORAL
  Filled 2016-03-04 (×2): qty 1

## 2016-03-04 NOTE — Progress Notes (Signed)
Due to 6.9 and voiding received 2 units this morning with dialysis will type and crossmatch for 2 additional units GI will see patient currently will place on clear liquids he denies angina ischemia dizziness orthopnea or PND while laying in bed on dialysis Randy [name] [id number] DOB: 23-Jun-1932 DOA: 03/03/2016 PCP: [name], MD   Physical Exam: Blood pressure 139/74, pulse 74, temperature 98 F (36.7 C), temperature source Oral, resp. rate 13, height 5' (1.524 m), weight 60.4 kg (133 lb 2.5 oz), SpO2 100 %. Lungs clear to A&P no rales wheeze rhonchi heart regular rhythm no murmurs, heaves thrills rubs abdomen soft nontender bowel sounds normoactive   Investigations:  Recent Results (from the past 240 hour(s))  MRSA PCR Screening     Status: None   Collection Time: 03/03/16 10:09 PM  Result Value Ref Range Status   MRSA by PCR NEGATIVE NEGATIVE Final    Comment:        The GeneXpert MRSA Assay (FDA approved for NASAL specimens only), is one component of a comprehensive MRSA colonization surveillance program. It is not intended to diagnose MRSA infection nor to guide or monitor treatment for MRSA infections.      Basic Metabolic Panel:  Recent Labs  03/03/16 2002 03/03/16 2030 03/04/16 0441  NA 138 139 138  K 4.3 4.2 4.5  CL 97* 97* 101  CO2 28  --  26  GLUCOSE 138* 134* 141*  BUN 79* 73* 83*  CREATININE 9.16* 9.10* 9.31*  CALCIUM 8.8*  --  8.3*   Liver Function Tests:  Recent Labs  03/04/16 0441  AST 12*  ALT 8*  ALKPHOS 53  BILITOT 0.5  PROT 5.1*  ALBUMIN 2.4*     CBC:  Recent Labs  03/03/16 2002 03/03/16 2030 03/04/16 0441  WBC 6.1  --  5.2  NEUTROABS 3.9  --   --   HGB 9.4* 10.2* 6.9*  HCT 30.9* 30.0* 22.8*  MCV 92.0  --  91.2  PLT 191  --  165    No results found.    Medications:   Impression:Hematochezia  Principal Problem:   GI bleeding Active Problems:   ESRD on dialysis [facility])   Essential hypertension   GI  bleed   Hyperglycemia   Coronary artery disease involving native coronary artery of native heart with angina pectoris (HCC)   Hyperlipidemia   Cardiomyopathy, ischemic     Plan: Hemoglobin and hematocrit every 8 hours. 2 units of packed cells nearly complete. We'll type and cross for 2 additional units. Place on clear liquid diet  Consultants: Gastroenterology and nephrology   Procedures hemodialysis this a.m.   Antibiotics:          Time spent: 30 minutes   LOS: 1 day   Melanie Pellot M   03/04/2016, 11:57 AM

## 2016-03-04 NOTE — Procedures (Signed)
   HEMODIALYSIS TREATMENT NOTE:  3.5 hour heparin-free dialysis completed via left forearm AVF (16g/antegrade). Goal NOT met: Unable to remove 2.5 liters as ordered. Ultrafiltration was interrupted x 20 minutes due to hypotension.  Net UF 2.2 liters. One unit PRBCs transfused with HD, pt's second unit for today.  Primary RN gave first unit prior to HD.  All blood was returned and hemostasis was achieved within 15 minutes. Report given to Donneta Romberg, RN.  Rockwell Alexandria, RN, CDN

## 2016-03-04 NOTE — Consult Note (Signed)
Requesting provider: Laban Emperor, NP Primary cardiologist: Dr. Carlyle Dolly Consulting cardiologist: Dr. Satira Sark  Reason for consultation: Normal troponin I, known CAD  Clinical Summary Randy [name] is a complex 82 y.o.male with past medical history outlined below, followed by Dr. Harl Bowie and last seen in March of this year. He is currently admitted to the hospital with rectal bleeding, found to have drop in hemoglobin down to 6.9 as of this morning. He has a prior history of lower GI bleeding of diverticular origin in January 2016. Colonoscopy is being considered by GI. We have been consulted for periprocedural cardiac evaluation.  Randy [name] has a history of moderate multivessel CAD that is being managed medically, most recent cardiac catheterization from November 2015 as outlined below. He has recurrent angina symptoms with ongoing physiologic stressors due to his comorbid disease. He reports chest pain this morning as well. Troponin I level is mildly increased at 0.09 and 0.10. At this time he has not been placed on any of his regular cardiac medications that he takes at home.   No Known Allergies  Medications Scheduled Medications: . aspirin EC  81 mg Oral Daily  . atorvastatin  80 mg Oral Daily  . brinzolamide  1 drop Both Eyes BID   And  . brimonidine  1 drop Both Eyes BID  . calcitRIOL  0.5 mcg Oral Daily  . famotidine (PEPCID) IV  20 mg Intravenous Q24H  . latanoprost  1 drop Both Eyes QHS  . multivitamin  1 tablet Oral Daily  . sevelamer carbonate  1,600 mg Oral With snacks  . sevelamer carbonate  2,400 mg Oral TID WC  . sodium chloride flush  3 mL Intravenous Q12H    PRN Medications: sodium chloride, sodium chloride, acetaminophen **OR** acetaminophen, gabapentin, lidocaine (PF), lidocaine-prilocaine, oxyCODONE, pentafluoroprop-tetrafluoroeth, polyvinyl alcohol   Past Medical History  Diagnosis Date  . Hypercholesterolemia   . Gout   . Nonischemic  cardiomyopathy (HCC)     LVEF 35-40%  . Anemia of chronic disease   . Essential hypertension   . Arthritis   . Peripheral vascular disease ([place])     Status post right below knee amputation for a nonhealing wound of the right foot 12/2014  . History of pneumonia 2014  . CAD (coronary artery disease)     Moderate multivessel disease 07/2015 - managed medically  . History of myopericarditis 2015    07/2015  . History of stroke   . ESRD on hemodialysis [facility])     M/W/F in [place] - Dr. Florene Glen  . History of blood transfusion   . Pneumonia     Past Surgical History  Procedure Laterality Date  . Knee arthroscopy Right 2007  . Back surgery    . Hemorrhoid surgery  1970's  . Ganglion cyst excision  01/03/2012    Procedure: REMOVAL GANGLION OF WRIST;  Surgeon: Scherry Ran, MD;  Location: [facility];  Service: General;  Laterality: Right;  . Colonoscopy      remote past by Dr. Tamala Julian in [place], [place]   . Av fistula placement  08/24/2012    Procedure: ARTERIOVENOUS (AV) FISTULA CREATION;  Surgeon: Rosetta Posner, MD;  Location: [place];  Service: Vascular;  Laterality: Left;  . Insertion of dialysis catheter Right      neck  . Insertion of dialysis catheter  10/19/2012    Procedure: INSERTION OF DIALYSIS CATHETER;  Surgeon: Rosetta Posner, MD;  Location: [place];  Service: Vascular;  Laterality: N/A;  REMOVE TEMPORARY CATH  . Cholecystectomy    . Lumbar disc surgery  2004  . Left heart catheterization with coronary angiogram N/A 08/17/2014    Procedure: LEFT HEART CATHETERIZATION WITH CORONARY ANGIOGRAM;  Surgeon: Larey Dresser, MD;  Location: [facility] CATH LAB;  Service: Cardiovascular;  Laterality: N/A;  . Colonoscopy Left 09/26/2014    Dr. Paulita Fujita: old and semi-fresh blood scattered throughout the colon, several medium-sized diverticula, no obvious focal bleeding site, no blood in TI, several polyps seen in cecum, ascending colon and proximal transverse s/p biopsy (tubular adenomas)  . Amputation  Right 01/05/2015    BKA  . Capd removal N/A 03/13/2015    Procedure: CONTINUOUS AMBULATORY PERITONEAL DIALYSIS  (CAPD) CATHETER REMOVAL;  Surgeon: Coralie Keens, MD;  Location: [place];  Service: General;  Laterality: N/A;  . Cardiac catheterization    . Above knee leg amputation Left 10/10/2015  . Amputation Left 10/10/2015    Procedure: AMPUTATION ABOVE KNEE LEFT;  Surgeon: Angelia Mould, MD;  Location: [facility] OR;  Service: Vascular;  Laterality: Left;    Family History  Problem Relation Age of Onset  . Arthritis    . Cancer    . Kidney disease    . Anesthesia problems Neg Hx   . Hypotension Neg Hx   . Malignant hyperthermia Neg Hx   . Pseudochol deficiency Neg Hx   . Cancer Sister   . Colon cancer Brother     unclear   . Colon cancer Brother   . Stroke Mother     Social History Randy [name] reports that he quit smoking about 17 years ago. His smoking use included Cigarettes. He has a 30 pack-year smoking history. He quit smokeless tobacco use about 22 years ago. His smokeless tobacco use included Chew. Randy [name] reports that he does not drink alcohol.  Review of Systems Complete review of systems negative except as otherwise outlined in the clinical summary and also the following. Stable recurring angina, NYHA class 2-3 dyspnea.  Physical Examination Blood pressure 153/70, pulse 75, temperature 97 F (36.1 C), temperature source Oral, resp. rate 16, height 5' (1.524 m), weight 132 lb 15 oz (60.3 kg), SpO2 100 %.  Intake/Output Summary (Last 24 hours) at 03/04/16 1027 Last data filed at 03/04/16 1000  Gross per 24 hour  Intake 598.75 ml  Output      0 ml  Net 598.75 ml   Telemetry: Sinus rhythm.  Gen.: Chronically ill-appearing male in no distress. Complains of mild chest discomfort. HEENT: Conjunctiva and lids normal, oropharynx clear. Neck: Supple, no elevated JVP or carotid bruits, no thyromegaly. Lungs: Diminished breath sounds, nonlabored breathing at  rest. Cardiac: Regular rate and rhythm, no S3, 3/6 systolic murmur, no pericardial rub. Abdomen: Soft, nontender, bowel sounds present. Extremities: Status post right BKA and left AKA. Left arm AV fistula. Skin: Warm and dry. Musculoskeletal: No kyphosis. Neuropsychiatric: Alert and oriented x3, affect grossly appropriate.  Lab Results  Basic Metabolic Panel:  Recent Labs Lab 03/03/16 2002 03/03/16 2030 03/04/16 0441  NA 138 139 138  K 4.3 4.2 4.5  CL 97* 97* 101  CO2 28  --  26  GLUCOSE 138* 134* 141*  BUN 79* 73* 83*  CREATININE 9.16* 9.10* 9.31*  CALCIUM 8.8*  --  8.3*    Liver Function Tests:  Recent Labs Lab 03/04/16 0441  AST 12*  ALT 8*  ALKPHOS 53  BILITOT 0.5  PROT 5.1*  ALBUMIN 2.4*  CBC:  Recent Labs Lab 03/03/16 2002 03/03/16 2030 03/04/16 0441  WBC 6.1  --  5.2  NEUTROABS 3.9  --   --   HGB 9.4* 10.2* 6.9*  HCT 30.9* 30.0* 22.8*  MCV 92.0  --  91.2  PLT 191  --  165    Cardiac Enzymes:  Recent Labs Lab 03/03/16 2002 03/04/16 0441  TROPONINI 0.09* 0.10*    ECG I personally reviewed the tracing from 12/17/2015 shows sinus rhythm with LVH and significant repolarization abnormalities.  Imaging  Echocardiogram 08/04/2015: Study Conclusions  - Left ventricle: The cavity size was normal. Wall thickness was  increased increased in a pattern of mild to moderate LVH.  Incidentally noted transverse false tendon in LV. Systolic  function was moderately to severely reduced. The estimated  ejection fraction was in the range of 30% to 35%. Diffuse  hypokinesis. There is severe hypokinesis of the  basal-midinferolateral and inferior myocardium. Doppler  parameters are consistent with restrictive physiology, indicative  of decreased left ventricular diastolic compliance and/or  increased left atrial pressure. - Aortic valve: Moderately calcified annulus. Trileaflet; mildly  calcified leaflets. Cusp separation was reduced.  There was  moderate stenosis. There was trivial regurgitation. Mean gradient  (S): 16 mm Hg. VTI ratio of LVOT to aortic valve: 0.4. Valve area  (VTI): 1.24 cm^2. Valve area (Vmax): 1.21 cm^2. - Mitral valve: Calcified annulus. There was trivial regurgitation. - Left atrium: The atrium was moderately dilated. - Right atrium: The atrium was moderately to severely dilated.  Central venous pressure (est): 3 mm Hg. - Atrial septum: No defect or patent foramen ovale was identified. - Tricuspid valve: There was mild regurgitation. - Pulmonary arteries: PA peak pressure: 38 mm Hg (S). - Pericardium, extracardiac: There was no pericardial effusion.  Impressions:  - Mild to moderate LVH with LVEF approximately 30%, diffuse  hypokinesis, most severe in the mid to basal inferior and  inferolateral wall. Diastolic filling pattern consistent with  restrictive physiology. Moderate left atrial enlargement.  Moderate calcific aortic stenosis with trivial aortic  regurgitation. Mild tricuspid regurgitation with PASP 38 mmHg.  Moderate to severe right atrial enlargement. Compared to the  previous study from December 2015, there has been further  reduction in LVEF, and progression in degree of aortic stenosis.  Cardiac catheterization 08/17/2014: Coronary dominance: right  Left mainstem: Short, no significant disease.   Left anterior descending (LAD): There was a moderate D1 that branched early with 40% ostial stenosis. This was followed by a large septal perforator. There was 40-50% proximal LAD stenosis at the takeoff of the septal perforator. 30% mid LAD stenosis at D2.   Left circumflex (LCx): There was a large ramus that branched early into a superior and and inferior division, both vessels were moderate in size. Both divisions of the ramus had relatively up to 80% proximal stenosis. The AV LCx itself had luminal irregularities.   Right coronary artery (RCA): There was an  early large acute marginal with 40% proximal and 40% mid vessel stenosis. The RCA had diffuse luminal irregularities and up to 40% mid vessel stenosis.   Impression  1. Mildly abnormal troponin I consistent with demand ischemia in light of comorbid illnesses and physiologic stressors.  2. Moderate multivessel CAD by cardiac catheterization in 2015 that has been managed medically. He has regular recurring angina. Outpatient medical regimen includes aspirin, Coreg, lisinopril, Norvasc, and Imdur.  3. Hyperlipidemia, on Lipitor.  4. Ischemic cardiomyopathy with LVEF approximately 30%.  5. Moderate calcific aortic  stenosis.  6. ESRD on hemodialysis.  7. GI bleed with hemoglobin down to 6.9, rectal bleeding reported. Colonoscopy is being considered.  Recommendations  No specific cardiac contraindication to proceeding with endoscopy under sedation. Patient has a number of comorbidities that increase his overall periprocedural risks. I reviewed his outpatient medications and have started him back on antianginal regimen since he has had recent symptoms. Packed red cell transfusion with correction of anemia should also help with angina symptoms. He is getting a hemodialysis session today. Obtain follow-up ECG. Would keep him in the ICU for now.  Satira Sark, M.D., F.A.C.C.

## 2016-03-04 NOTE — Consult Note (Signed)
Reason for Consult: End-stage renal disease Referring Physician: Dr. [name] is an 82 y.o. male.  HPI: He is a patient Y as history of ischemic cardiomyopathy, CVA, end-stage renal disease on maintenance hemodialysis presently came to the emergency room with GI bleeding. According to the patient when he went to the bathroom and clean himself he saw some blood. That's followed with frank bleeding hence decided to come to the hospital. When he was evaluated patient was found to have significant anemia. Presently patient denies any abdominal pain, nausea or vomiting. He denies also any difficulty breathing.  Past Medical History  Diagnosis Date  . Hypercholesterolemia   . Gout   . Nonischemic cardiomyopathy (HCC)     LVEF 35-40%  . Anemia of chronic disease   . Essential hypertension   . Arthritis   . Peripheral vascular disease ([place])     Status post right below knee amputation for a nonhealing wound of the right foot 12/2014  . History of pneumonia 2014  . CAD (coronary artery disease)     Moderate multivessel disease 07/2015 - managed medically  . History of myopericarditis 2015    07/2015  . Stroke [facility])     "they said I did"  "I did not know anything about it"  . ESRD on hemodialysis [facility])     M/W/F in [place] - Dr. Florene Glen  . Constipation   . History of blood transfusion   . Pneumonia   . Acute myopericarditis 2015  . Chest pain 11/2015    Past Surgical History  Procedure Laterality Date  . Knee arthroscopy Right 2007  . Back surgery    . Hemorrhoid surgery  1970's  . Ganglion cyst excision  01/03/2012    Procedure: REMOVAL GANGLION OF WRIST;  Surgeon: Scherry Ran, MD;  Location: [facility];  Service: General;  Laterality: Right;  . Colonoscopy      remote past by Dr. Tamala Julian in [place], [place]   . Av fistula placement  08/24/2012    Procedure: ARTERIOVENOUS (AV) FISTULA CREATION;  Surgeon: Rosetta Posner, MD;  Location: [place];  Service: Vascular;   Laterality: Left;  . Insertion of dialysis catheter Right      neck  . Insertion of dialysis catheter  10/19/2012    Procedure: INSERTION OF DIALYSIS CATHETER;  Surgeon: Rosetta Posner, MD;  Location: [place];  Service: Vascular;  Laterality: N/A;  REMOVE TEMPORARY CATH  . Cholecystectomy    . Lumbar disc surgery  2004  . Left heart catheterization with coronary angiogram N/A 08/17/2014    Procedure: LEFT HEART CATHETERIZATION WITH CORONARY ANGIOGRAM;  Surgeon: Larey Dresser, MD;  Location: [facility] C[facility] LAB;  Service: Cardiovascular;  Laterality: N/A;  . Colonoscopy Left 09/26/2014    Dr. Paulita Fujita: old and semi-fresh blood scattered throughout the colon, several medium-sized diverticula, no obvious focal bleeding site, no blood in TI, several polyps seen in cecum, ascending colon and proximal transverse s/p biopsy (tubular adenomas)  . Amputation Right 01/05/2015    BKA  . Capd removal N/A 03/13/2015    Procedure: CONTINUOUS AMBULATORY PERITONEAL DIALYSIS  (CAPD) CATHETER REMOVAL;  Surgeon: Coralie Keens, MD;  Location: [place];  Service: General;  Laterality: N/A;  . Cardiac catheterization    . Above knee leg amputation Left 10/10/2015  . Amputation Left 10/10/2015    Procedure: AMPUTATION ABOVE KNEE LEFT;  Surgeon: Angelia Mould, MD;  Location: [place];  Service: Vascular;  Laterality: Left;  Family History  Problem Relation Age of Onset  . Arthritis    . Cancer    . Kidney disease    . Anesthesia problems Neg Hx   . Hypotension Neg Hx   . Malignant hyperthermia Neg Hx   . Pseudochol deficiency Neg Hx   . Cancer Sister   . Colon cancer Brother     unclear   . Colon cancer Brother   . Stroke Mother     Social History:  reports that he quit smoking about 17 years ago. His smoking use included Cigarettes. He has a 30 pack-year smoking history. He quit smokeless tobacco use about 22 years ago. His smokeless tobacco use included Chew. He reports that he does not drink alcohol or use  illicit drugs.  Allergies: No Known Allergies  Medications: I have reviewed the patient's current medications.  Results for orders placed or performed during the hospital encounter of 03/03/16 (from the past 48 hour(s))  CBC with Differential     Status: Abnormal   Collection Time: 03/03/16  8:02 PM  Result Value Ref Range   WBC 6.1 4.0 - 10.5 K/uL   RBC 3.36 (L) 4.22 - 5.81 MIL/uL   Hemoglobin 9.4 (L) 13.0 - 17.0 g/dL   HCT 30.9 (L) 39.0 - 52.0 %   MCV 92.0 78.0 - 100.0 fL   MCH 28.0 26.0 - 34.0 pg   MCHC 30.4 30.0 - 36.0 g/dL   RDW 19.9 (H) 11.5 - 15.5 %   Platelets 191 150 - 400 K/uL   Neutrophils Relative % 65 %   Neutro Abs 3.9 1.7 - 7.7 K/uL   Lymphocytes Relative 19 %   Lymphs Abs 1.1 0.7 - 4.0 K/uL   Monocytes Relative 12 %   Monocytes Absolute 0.8 0.1 - 1.0 K/uL   Eosinophils Relative 4 %   Eosinophils Absolute 0.3 0.0 - 0.7 K/uL   Basophils Relative 0 %   Basophils Absolute 0.0 0.0 - 0.1 K/uL  Basic metabolic panel     Status: Abnormal   Collection Time: 03/03/16  8:02 PM  Result Value Ref Range   Sodium 138 135 - 145 mmol/L   Potassium 4.3 3.5 - 5.1 mmol/L   Chloride 97 (L) 101 - 111 mmol/L   CO2 28 22 - 32 mmol/L   Glucose, Bld 138 (H) 65 - 99 mg/dL   BUN 79 (H) 6 - 20 mg/dL   Creatinine, Ser 9.16 (H) 0.61 - 1.24 mg/dL   Calcium 8.8 (L) 8.9 - 10.3 mg/dL   GFR calc non Af Amer 5 (L) >60 mL/min   GFR calc Af Amer 5 (L) >60 mL/min    Comment: (NOTE) The eGFR has been calculated using the CKD EPI equation. This calculation has not been validated in all clinical situations. eGFR's persistently <60 mL/min signify possible Chronic Kidney Disease.    Anion gap 13 5 - 15  Troponin I (q 6hr x 3)     Status: Abnormal   Collection Time: 03/03/16  8:02 PM  Result Value Ref Range   Troponin I 0.09 (H) <0.031 ng/mL    Comment:        PERSISTENTLY INCREASED TROPONIN VALUES IN THE RANGE OF 0.04-0.49 ng/mL CAN BE SEEN IN:       -UNSTABLE ANGINA       -CONGESTIVE  HEART FAILURE       -MYOCARDITIS       -CHEST TRAUMA       -ARRYHTHMIAS       -  LATE PRESENTING MYOCARDIAL INFARCTION       -COPD   CLINICAL FOLLOW-UP RECOMMENDED.   Type and screen     Status: None (Preliminary result)   Collection Time: 03/03/16  8:18 PM  Result Value Ref Range   ABO/RH(D) O POS    Antibody Screen NEG    Sample Expiration 03/06/2016    Unit Number [id number]    Blood Component Type RED CELLS,LR    Unit division 00    Status of Unit ISSUED    Transfusion Status OK TO TRANSFUSE    Crossmatch Result Compatible    Unit Number [id number]    Blood Component Type RED CELLS,LR    Unit division 00    Status of Unit ALLOCATED    Transfusion Status OK TO TRANSFUSE    Crossmatch Result Compatible   Prepare RBC     Status: None   Collection Time: 03/03/16  8:18 PM  Result Value Ref Range   Order Confirmation ORDER PROCESSED BY BLOOD BANK   POC occult blood, ED Provider will collect     Status: Abnormal   Collection Time: 03/03/16  8:29 PM  Result Value Ref Range   Fecal Occult Bld POSITIVE (A) NEGATIVE  I-stat chem 8, ed     Status: Abnormal   Collection Time: 03/03/16  8:30 PM  Result Value Ref Range   Sodium 139 135 - 145 mmol/L   Potassium 4.2 3.5 - 5.1 mmol/L   Chloride 97 (L) 101 - 111 mmol/L   BUN 73 (H) 6 - 20 mg/dL   Creatinine, Ser 9.10 (H) 0.61 - 1.24 mg/dL   Glucose, Bld 134 (H) 65 - 99 mg/dL   Calcium, Ion 1.12 (L) 1.13 - 1.30 mmol/L   TCO2 28 0 - 100 mmol/L   Hemoglobin 10.2 (L) 13.0 - 17.0 g/dL   HCT 30.0 (L) 39.0 - 52.0 %  MRSA PCR Screening     Status: None   Collection Time: 03/03/16 10:09 PM  Result Value Ref Range   MRSA by PCR NEGATIVE NEGATIVE    Comment:        The GeneXpert MRSA Assay (FDA approved for NASAL specimens only), is one component of a comprehensive MRSA colonization surveillance program. It is not intended to diagnose MRSA infection nor to guide or monitor treatment for MRSA infections.   Troponin I (q 6hr x  3)     Status: Abnormal   Collection Time: 03/04/16  4:41 AM  Result Value Ref Range   Troponin I 0.10 (H) <0.031 ng/mL    Comment:        PERSISTENTLY INCREASED TROPONIN VALUES IN THE RANGE OF 0.04-0.49 ng/mL CAN BE SEEN IN:       -UNSTABLE ANGINA       -CONGESTIVE HEART FAILURE       -MYOCARDITIS       -CHEST TRAUMA       -ARRYHTHMIAS       -LATE PRESENTING MYOCARDIAL INFARCTION       -COPD   CLINICAL FOLLOW-UP RECOMMENDED.   Comprehensive metabolic panel     Status: Abnormal   Collection Time: 03/04/16  4:41 AM  Result Value Ref Range   Sodium 138 135 - 145 mmol/L   Potassium 4.5 3.5 - 5.1 mmol/L   Chloride 101 101 - 111 mmol/L   CO2 26 22 - 32 mmol/L   Glucose, Bld 141 (H) 65 - 99 mg/dL   BUN 83 (H) 6 - 20 mg/dL   Creatinine,  Ser 9.31 (H) 0.61 - 1.24 mg/dL   Calcium 8.3 (L) 8.9 - 10.3 mg/dL   Total Protein 5.1 (L) 6.5 - 8.1 g/dL   Albumin 2.4 (L) 3.5 - 5.0 g/dL   AST 12 (L) 15 - 41 U/L   ALT 8 (L) 17 - 63 U/L   Alkaline Phosphatase 53 38 - 126 U/L   Total Bilirubin 0.5 0.3 - 1.2 mg/dL   GFR calc non Af Amer 5 (L) >60 mL/min   GFR calc Af Amer 5 (L) >60 mL/min    Comment: (NOTE) The eGFR has been calculated using the CKD EPI equation. This calculation has not been validated in all clinical situations. eGFR's persistently <60 mL/min signify possible Chronic Kidney Disease.    Anion gap 11 5 - 15  CBC     Status: Abnormal   Collection Time: 03/04/16  4:41 AM  Result Value Ref Range   WBC 5.2 4.0 - 10.5 K/uL   RBC 2.50 (L) 4.22 - 5.81 MIL/uL   Hemoglobin 6.9 (LL) 13.0 - 17.0 g/dL    Comment: DELTA CHECK NOTED RESULT REPEATED AND VERIFIED CRITICAL RESULT CALLED TO, READ BACK BY AND VERIFIED WITH: Deanne Coffer RN OLN [id number] AT 0615 BY RESSEGGER CORRECTED ON 06/12 AT 0614: PREVIOUSLY REPORTED AS DELTA CHECK NOTED RESULT REPEATED AND VERIFIED    HCT 22.8 (L) 39.0 - 52.0 %   MCV 91.2 78.0 - 100.0 fL   MCH 28.0 26.0 - 34.0 pg   MCHC 30.7 30.0 - 36.0 g/dL   RDW  19.9 (H) 11.5 - 15.5 %   Platelets 165 150 - 400 K/uL    No results found.  Review of Systems  Constitutional: Negative for fever and chills.  Respiratory: Negative for cough and shortness of breath.   Cardiovascular: Negative for chest pain, orthopnea and leg swelling.  Gastrointestinal: Positive for blood in stool. Negative for nausea, vomiting and abdominal pain.   Blood pressure 134/61, pulse 76, temperature 97 F (36.1 C), temperature source Oral, resp. rate 17, height 5' (1.524 m), weight 60.3 kg (132 lb 15 oz), SpO2 99 %. Physical Exam  Constitutional: He is oriented to person, place, and time. No distress.  Neck: No JVD present.  Cardiovascular: Normal rate and regular rhythm.   Respiratory: No respiratory distress. He has no wheezes.  GI: There is no tenderness. There is no rebound.  Musculoskeletal: He exhibits no edema.  Neurological: He is alert and oriented to person, place, and time.    Assessment/Plan: Problem #1 GI bleeding: Presently patient is seen by GI. He has received a unit of blood transfusion. Problem but to end-stage renal disease: He is status post hemodialysis on Friday. He is due for dialysis today. Problem #3 hypertension: His blood pressure is reasonably controlled Problem #4 history of ischemic cardiomyopathy Problem but 5 history of peripheral vascular disease: Status post right BKA and left AKA Problem #6 fluid management: No significant sign of fluid overload Problem #7 metabolic bone disease: Calcium is range. Plan: 1]We'll dialyze patient today for 31/2 hours 2] we'll give him the remaining pack red blood cell on dialysis 3] we'll remove 21/2 l if his blood pressure tolerates 4] we'll check renal panel and CBC in the morning.  Caramia Boutin S 03/04/2016, 9:54 AM

## 2016-03-04 NOTE — Consult Note (Signed)
Referring Provider: Dr. Jani Gravel  Primary Care Physician:  [name], MD Primary Gastroenterologist:  Last evaluated be [facility] 2016. Attending GI physician Dr. Gala Romney   Date of Admission: 03/03/16 Date of Consultation: 03/04/16  Reason for Consultation:  GI bleed   HPI:  Randy [name] is an 82 y.o. year old male with a history of CAD, ESRD on dialysis, anemia of chronic disease, chronic heart failure, lower GI bleed likely of diverticula origin in Jan 2016, presenting this admission with rectal bleeding. Admitting Hgb 9.4, with a baseline in 10 range, dropping to 6.9 early this morning. 2 units PRBCs ordered.  Troponin elevated at 0.10.   Previously hospitalized in Dec 2015/Jan 2016 due to lower GI bleed, with Dr. Paulita Fujita performing colonoscopy noting old and semi-fresh blood scattered throughout the colon, several medium-sized diverticula, no obvious focal bleeding site, no blood in TI, several polyps seen in cecum, ascending colon and proximal transverse s/p biopsy (tubular adenomas).   Caretaker, [name], at bedside. Patient lives at home with his wife. States he had acute onset of large volume bright red blood per rectum yesterday afternoon. No abdominal pain, N/V. Feels dizzy currently. No chest pain or shortness of breath. Denies anticoagulation, NSAIDs. Takes an 81 mg aspirin daily. Dialysis on M, W, Fri. Last episode of bleeding this morning, bright red. Large amount. No upper GI symptoms. No known history of liver disease.   Past Medical History  Diagnosis Date  . Hypercholesterolemia   . Gout   . Nonischemic cardiomyopathy (HCC)     LVEF 35-40%  . Anemia of chronic disease   . Essential hypertension   . Arthritis   . Peripheral vascular disease ([place])     Status post right below knee amputation for a nonhealing wound of the right foot 12/2014  . History of pneumonia 2014  . CAD (coronary artery disease)     Moderate multivessel disease 07/2015 - managed medically  .  History of myopericarditis 2015    07/2015  . Stroke [facility])     "they said I did"  "I did not know anything about it"  . ESRD on hemodialysis [facility])     M/W/F in [place] - Dr. Florene Glen  . Constipation   . History of blood transfusion   . Pneumonia   . Acute myopericarditis 2015  . Chest pain 11/2015    Past Surgical History  Procedure Laterality Date  . Knee arthroscopy Right 2007  . Back surgery    . Hemorrhoid surgery  1970's  . Ganglion cyst excision  01/03/2012    Procedure: REMOVAL GANGLION OF WRIST;  Surgeon: Scherry Ran, MD;  Location: [facility];  Service: General;  Laterality: Right;  . Colonoscopy      remote past by Dr. Tamala Julian in [place], [place]   . Av fistula placement  08/24/2012    Procedure: ARTERIOVENOUS (AV) FISTULA CREATION;  Surgeon: Rosetta Posner, MD;  Location: [place];  Service: Vascular;  Laterality: Left;  . Insertion of dialysis catheter Right      neck  . Insertion of dialysis catheter  10/19/2012    Procedure: INSERTION OF DIALYSIS CATHETER;  Surgeon: Rosetta Posner, MD;  Location: [place];  Service: Vascular;  Laterality: N/A;  REMOVE TEMPORARY CATH  . Cholecystectomy    . Lumbar disc surgery  2004  . Left heart catheterization with coronary angiogram N/A 08/17/2014    Procedure: LEFT HEART CATHETERIZATION WITH CORONARY ANGIOGRAM;  Surgeon: Larey Dresser, MD;  Location: [place] CATH LAB;  Service: Cardiovascular;  Laterality: N/A;  . Colonoscopy Left 09/26/2014    Dr. Paulita Fujita: old and semi-fresh blood scattered throughout the colon, several medium-sized diverticula, no obvious focal bleeding site, no blood in TI, several polyps seen in cecum, ascending colon and proximal transverse s/p biopsy (tubular adenomas)  . Amputation Right 01/05/2015    BKA  . Capd removal N/A 03/13/2015    Procedure: CONTINUOUS AMBULATORY PERITONEAL DIALYSIS  (CAPD) CATHETER REMOVAL;  Surgeon: Coralie Keens, MD;  Location: [place];  Service: General;  Laterality: N/A;  . Cardiac  catheterization    . Above knee leg amputation Left 10/10/2015  . Amputation Left 10/10/2015    Procedure: AMPUTATION ABOVE KNEE LEFT;  Surgeon: Angelia Mould, MD;  Location: [place];  Service: Vascular;  Laterality: Left;    Prior to Admission medications   Medication Sig Start Date End Date Taking? Authorizing Provider  amLODipine (NORVASC) 10 MG tablet Take 0.5 tablets (5 mg total) by mouth daily. 10/31/15  Yes Ivan Anchors Love, PA-C  aspirin 81 MG tablet Take 81 mg by mouth daily.   Yes Historical Provider, MD  atorvastatin (LIPITOR) 80 MG tablet Take 1 tablet (80 mg total) by mouth daily. 11/02/15  Yes Arnoldo Lenis, MD  calcitRIOL (ROCALTROL) 0.5 MCG capsule Take 1 capsule (0.5 mcg total) by mouth daily. 11/10/15  Yes Lucia Gaskins, MD  carvedilol (COREG) 12.5 MG tablet Take 1 tablet (12.5 mg total) by mouth 2 (two) times daily with a meal. 12/20/15  Yes Ripudeep K Rai, MD  docusate sodium (COLACE) 100 MG capsule Take 2 capsules (200 mg total) by mouth daily. Patient taking differently: Take 200 mg by mouth daily as needed for mild constipation or moderate constipation.  10/31/15  Yes Ivan Anchors Love, PA-C  gabapentin (NEURONTIN) 100 MG capsule Take 1 capsule (100 mg total) by mouth at bedtime. 10/23/15  Yes Barton Dubois, MD  hydrALAZINE (APRESOLINE) 25 MG tablet Tke 3 tabs (75mg ) TID (3x /day) on non-dialysis days and 50mg  (2tabs) TID on HD days. Patient taking differently: Take 25 mg by mouth 3 (three) times daily. Does not take until arrives back home on dialysis days (MWF) 12/20/15  Yes Ripudeep Krystal Eaton, MD  isosorbide mononitrate (IMDUR) 30 MG 24 hr tablet Take 0.5 tablets (15 mg total) by mouth at bedtime. 09/20/15  Yes Arnoldo Lenis, MD  lidocaine-prilocaine (EMLA) cream Apply 1 application topically every Monday, Wednesday, and Friday. For dialysis 10/06/15  Yes Historical Provider, MD  lisinopril (PRINIVIL,ZESTRIL) 40 MG tablet Take 1 tablet (40 mg total) by mouth at bedtime. 12/20/15   Yes Ripudeep K Rai, MD  LUMIGAN 0.01 % SOLN Place 1 drop into both eyes at bedtime. 09/18/15  Yes Historical Provider, MD  multivitamin (RENA-VIT) TABS tablet Take 1 tablet by mouth every Monday, Wednesday, and Friday. Takes on dialysis days   Yes Historical Provider, MD  nitroGLYCERIN (NITROSTAT) 0.4 MG SL tablet Place 1 tablet (0.4 mg total) under the tongue every 5 (five) minutes as needed for chest pain. 12/20/15  Yes Ripudeep Krystal Eaton, MD  oxyCODONE (OXY IR/ROXICODONE) 5 MG immediate release tablet Take 1 tablet (5 mg total) by mouth every 6 (six) hours as needed for moderate pain or severe pain. 10/31/15  Yes Ivan Anchors Love, PA-C  pantoprazole (PROTONIX) 40 MG tablet Take 1 tablet (40 mg total) by mouth daily at 12 noon. 10/23/15  Yes Barton Dubois, MD  polyvinyl alcohol (LIQUIFILM TEARS) 1.4 % ophthalmic solution  Place 1 drop into both eyes as needed for dry eyes. 10/31/15  Yes Ivan Anchors Love, PA-C  sevelamer carbonate (RENVELA) 800 MG tablet Take 1,600-2,400 mg by mouth See admin instructions. Take 3 tablets (2400 mg) with meals and 2 tablets (1600 mg) with snacks   Yes Historical Provider, MD  SIMBRINZA 1-0.2 % SUSP Place 1 drop into both eyes 2 (two) times daily. 09/18/15  Yes Historical Provider, MD    Current Facility-Administered Medications  Medication Dose Route Frequency Provider Last Rate Last Dose  . acetaminophen (TYLENOL) tablet 650 mg  650 mg Oral Q6H PRN Jani Gravel, MD       Or  . acetaminophen (TYLENOL) suppository 650 mg  650 mg Rectal Q6H PRN Jani Gravel, MD      . aspirin EC tablet 81 mg  81 mg Oral Daily Jani Gravel, MD      . atorvastatin (LIPITOR) tablet 80 mg  80 mg Oral Daily Jani Gravel, MD      . calcitRIOL (ROCALTROL) capsule 0.5 mcg  0.5 mcg Oral Daily Jani Gravel, MD      . famotidine (PEPCID) IVPB 20 mg premix  20 mg Intravenous Q24H Jani Gravel, MD      . gabapentin (NEURONTIN) capsule 100 mg  100 mg Oral QHS PRN Jani Gravel, MD      . latanoprost (XALATAN) 0.005 % ophthalmic  solution 1 drop  1 drop Both Eyes QHS Jani Gravel, MD   1 drop at 03/03/16 2215  . multivitamin (RENA-VIT) tablet 1 tablet  1 tablet Oral Daily Jani Gravel, MD      . NON FORMULARY 1 drop  1 drop Topical BID Jani Gravel, MD   1 drop at 03/03/16 2215  . oxyCODONE (Oxy IR/ROXICODONE) immediate release tablet 5 mg  5 mg Oral Q6H PRN Jani Gravel, MD      . polyvinyl alcohol (LIQUIFILM TEARS) 1.4 % ophthalmic solution 1 drop  1 drop Both Eyes PRN Jani Gravel, MD      . sevelamer carbonate (RENVELA) tablet 1,600 mg  1,600 mg Oral With snacks Jani Gravel, MD      . sevelamer carbonate (RENVELA) tablet 2,400 mg  2,400 mg Oral TID WC Jani Gravel, MD   2,400 mg at 03/04/16 0800  . sodium chloride flush (NS) 0.9 % injection 3 mL  3 mL Intravenous Q12H Jani Gravel, MD   3 mL at 03/03/16 2215    Allergies as of 03/03/2016  . (No Known Allergies)    Family History  Problem Relation Age of Onset  . Arthritis    . Cancer    . Kidney disease    . Anesthesia problems Neg Hx   . Hypotension Neg Hx   . Malignant hyperthermia Neg Hx   . Pseudochol deficiency Neg Hx   . Cancer Sister   . Colon cancer Brother     unclear   . Colon cancer Brother   . Stroke Mother     Social History   Social History  . Marital Status: Married    Spouse Name: N/A  . Number of Children: N/A  . Years of Education: 10   Occupational History  .     Social History Main Topics  . Smoking status: Former Smoker -- 1.00 packs/day for 30 years    Types: Cigarettes    Quit date: 08/19/1998  . Smokeless tobacco: Former Systems developer    Types: Chew    Quit date: 12/31/1993  . Alcohol Use: No  .  Drug Use: No  . Sexual Activity: No   Other Topics Concern  . Not on file   Social History Narrative    Review of Systems: Gen: Denies fever, chills, loss of appetite, change in weight or weight loss CV: see HPI  Resp: see HPI  GI: see HPI  GU : see HPI  MS: bilateral amputee  Derm: Denies rash, itching, dry skin Psych: Denies depression,  anxiety,confusion, or memory loss Heme: see HPI   Physical Exam: Vital signs in last 24 hours: Temp:  [96.9 F (36.1 C)-97.6 F (36.4 C)] 97 F (36.1 C) (06/12 0845) Pulse Rate:  [66-77] 76 (06/12 0845) Resp:  [11-20] 16 (06/12 0845) BP: (66-146)/(39-92) 114/64 mmHg (06/12 0845) SpO2:  [91 %-100 %] 98 % (06/12 0845) Weight:  [123 lb 14 oz (56.189 kg)-132 lb 15 oz (60.3 kg)] 132 lb 15 oz (60.3 kg) (06/12 0500) Last BM Date: 03/03/16 General:   Alert,  Well-developed, well-nourished, pleasant and cooperative in NAD Head:  Normocephalic and atraumatic. Eyes:  Sclera clear, no icterus.   Conjunctiva pink. Ears:  Normal auditory acuity. Nose:  No deformity, discharge,  or lesions. Lungs:  Clear throughout to auscultation.    Heart:  Regular rate and rhythm, notable systolic murmur consistent with history of aortic stenosis  Abdomen:  Soft, nontender and nondistended. No masses, hepatosplenomegaly or hernias noted. Normal bowel sounds, without guarding, and without rebound.   Rectal:  Deferred until time of colonoscopy.   Msk:  Left AKA, right BKA  Neurologic:  Alert and  oriented x4 Psych:  Alert and cooperative. Normal mood and affect.  Intake/Output from previous day: 06/11 0701 - 06/12 0700 In: 263.8 [I.V.:263.8] Out: -  Intake/Output this shift: Total I/O In: 30 [Blood:30] Out: -   Lab Results:  Recent Labs  03/03/16 2002 03/03/16 2030 03/04/16 0441  WBC 6.1  --  5.2  HGB 9.4* 10.2* 6.9*  HCT 30.9* 30.0* 22.8*  PLT 191  --  165   BMET  Recent Labs  03/03/16 2002 03/03/16 2030 03/04/16 0441  NA 138 139 138  K 4.3 4.2 4.5  CL 97* 97* 101  CO2 28  --  26  GLUCOSE 138* 134* 141*  BUN 79* 73* 83*  CREATININE 9.16* 9.10* 9.31*  CALCIUM 8.8*  --  8.3*   LFT  Recent Labs  03/04/16 0441  PROT 5.1*  ALBUMIN 2.4*  AST 12*  ALT 8*  ALKPHOS 54  BILITOT 0.5    Impression: 82 year old male with extensive medical history, presenting with acute onset of  painless, large-volume hematochezia likely diverticular in origin. Does not appear to be an ischemic presentation or rapid transit upper GI bleed. Acute blood loss anemia secondary to GI bleed. Notably, he was hospitalized in Dec/Jan 2016 with similar presentation and underwent a colonoscopy by Dr. Paulita Fujita and felt to likely be diverticular in origin. He is on no anticoagulation but does take an 81 mg aspirin daily. Troponin bump noted and likely secondary to demand ischemia. Currently in process of receiving 1 of 2 units PRBCs due to 4 gram drop in Hgb since admission. May need cardiology evaluation due to extensive history; colonoscopy timing to be determined after discussion with Dr. Gala Romney. He is NPO for now. Agree with transfusion and supportive measures. Further recommendations to follow shortly.   Plan: Supportive measures Transfusion as ordered and prn  Follow H/H Remain NPO  Anticipate colonoscopy when clinically stable Cardiology consult requested prior to colonoscopy (discussed with RN)  Will continue to follow with you  Orvil Feil, ANP-BC [facility]   Attending note:  Patient seen and examined. Small bloody stool this morning but has since ceased. Patient wants to eat. Scenario very similar to that when he presented with a diverticular bleed in [place]. Recent colonoscopy results reviewed. I suspect, at this point,  self-limiting diverticular bleed. He may not need endoscopic evaluation. Will advance to clear liquids today. Continue heparin free dialysis. If he does rebleed acutely, would consider a tagged RBC study.        LOS: 1 day    03/04/2016, 8:51 AM

## 2016-03-04 NOTE — Progress Notes (Signed)
IV leaking. New IV insertion attempted. Vein blew. Patient refused further IV sticks at this time. Will attempt to have another RN insert peripheral IV if allowed by patient.

## 2016-03-04 NOTE — Care Management Important Message (Signed)
Important Message  Patient Details  Name: Randy [name] MRN: [id number] Date of Birth: [birth year]   Medicare Important Message Given:  Yes    Sherald Barge, RN 03/04/2016, 12:31 PM

## 2016-03-04 NOTE — Progress Notes (Signed)
**Note De-identified Randy [name] Obfuscation** EKG complete:abnormal results reported to RN and placed in patient chart 

## 2016-03-04 NOTE — Progress Notes (Signed)
hgb critical at 6.9. Result verified with lab and paged to MD

## 2016-03-05 ENCOUNTER — Inpatient Hospital Stay (HOSPITAL_COMMUNITY): Payer: Medicare Other

## 2016-03-05 ENCOUNTER — Encounter (HOSPITAL_COMMUNITY): Payer: Self-pay | Admitting: *Deleted

## 2016-03-05 ENCOUNTER — Encounter (HOSPITAL_COMMUNITY)
Admission: EM | Disposition: A | Discharge status: Home / Self Care | Payer: Self-pay | Source: Home / Self Care | Attending: Internal Medicine

## 2016-03-05 ENCOUNTER — Other Ambulatory Visit: Payer: Self-pay

## 2016-03-05 DIAGNOSIS — K625 Hemorrhage of anus and rectum: Secondary | ICD-10-CM

## 2016-03-05 DIAGNOSIS — I350 Nonrheumatic aortic (valve) stenosis: Secondary | ICD-10-CM

## 2016-03-05 DIAGNOSIS — K922 Gastrointestinal hemorrhage, unspecified: Secondary | ICD-10-CM

## 2016-03-05 DIAGNOSIS — K5731 Diverticulosis of large intestine without perforation or abscess with bleeding: Principal | ICD-10-CM

## 2016-03-05 DIAGNOSIS — K2970 Gastritis, unspecified, without bleeding: Secondary | ICD-10-CM

## 2016-03-05 DIAGNOSIS — K921 Melena: Secondary | ICD-10-CM

## 2016-03-05 DIAGNOSIS — K269 Duodenal ulcer, unspecified as acute or chronic, without hemorrhage or perforation: Secondary | ICD-10-CM

## 2016-03-05 HISTORY — PX: ESOPHAGOGASTRODUODENOSCOPY: SHX5428

## 2016-03-05 HISTORY — PX: COLONOSCOPY: SHX5424

## 2016-03-05 LAB — HEMOGLOBIN AND HEMATOCRIT, BLOOD
HCT: 26 % — ABNORMAL LOW (ref 39.0–52.0)
HEMATOCRIT: 22.1 % — AB (ref 39.0–52.0)
HEMOGLOBIN: 8.3 g/dL — AB (ref 13.0–17.0)
Hemoglobin: 7.1 g/dL — ABNORMAL LOW (ref 13.0–17.0)

## 2016-03-05 LAB — HEPATITIS B SURFACE ANTIGEN: HEP B S AG: NEGATIVE

## 2016-03-05 LAB — RENAL FUNCTION PANEL
Albumin: 2.5 g/dL — ABNORMAL LOW (ref 3.5–5.0)
Anion gap: 10 (ref 5–15)
BUN: 42 mg/dL — ABNORMAL HIGH (ref 6–20)
CALCIUM: 8.7 mg/dL — AB (ref 8.9–10.3)
CO2: 29 mmol/L (ref 22–32)
Chloride: 95 mmol/L — ABNORMAL LOW (ref 101–111)
Creatinine, Ser: 6.1 mg/dL — ABNORMAL HIGH (ref 0.61–1.24)
GFR calc Af Amer: 9 mL/min — ABNORMAL LOW (ref >60)
GFR, EST NON AFRICAN AMERICAN: 8 mL/min — AB (ref >60)
Glucose, Bld: 95 mg/dL (ref 65–99)
PHOSPHORUS: 5.5 mg/dL — AB (ref 2.5–4.6)
POTASSIUM: 4.3 mmol/L (ref 3.5–5.1)
Sodium: 134 mmol/L — ABNORMAL LOW (ref 135–145)

## 2016-03-05 SURGERY — COLONOSCOPY
Anesthesia: Moderate Sedation

## 2016-03-05 MED ORDER — SIMETHICONE 40 MG/0.6ML PO SUSP
ORAL | Status: DC | PRN
  Administered 2016-03-05: 16:00:00

## 2016-03-05 MED ORDER — HYDRALAZINE HCL 20 MG/ML IJ SOLN
5.0000 mg | Freq: Once | INTRAMUSCULAR | Status: AC
  Administered 2016-03-05: 5 mg via INTRAVENOUS
  Filled 2016-03-05: qty 1

## 2016-03-05 MED ORDER — FENTANYL CITRATE (PF) 100 MCG/2ML IJ SOLN
INTRAMUSCULAR | Status: DC | PRN
  Administered 2016-03-05 (×2): 25 ug via INTRAVENOUS

## 2016-03-05 MED ORDER — SODIUM CHLORIDE 0.9 % IV BOLUS (SEPSIS)
250.0000 mL | Freq: Once | INTRAVENOUS | Status: AC
  Administered 2016-03-05: 250 mL via INTRAVENOUS

## 2016-03-05 MED ORDER — SODIUM CHLORIDE 0.9 % IV SOLN
Freq: Once | INTRAVENOUS | Status: AC
  Administered 2016-03-07: 12:00:00 via INTRAVENOUS

## 2016-03-05 MED ORDER — ISOSORBIDE MONONITRATE ER 30 MG PO TB24
15.0000 mg | ORAL_TABLET | Freq: Every day | ORAL | Status: DC
Start: 2016-03-05 — End: 2016-03-14
  Administered 2016-03-05 – 2016-03-13 (×9): 15 mg via ORAL
  Filled 2016-03-05 (×9): qty 1

## 2016-03-05 MED ORDER — SODIUM CHLORIDE 0.9 % IV SOLN
Freq: Once | INTRAVENOUS | Status: AC
  Administered 2016-03-05: 11:00:00 via INTRAVENOUS

## 2016-03-05 MED ORDER — MIDAZOLAM HCL 5 MG/5ML IJ SOLN
INTRAMUSCULAR | Status: DC | PRN
Start: 2016-03-05 — End: 2016-03-05
  Administered 2016-03-05: 2 mg via INTRAVENOUS
  Administered 2016-03-05 (×2): 1 mg via INTRAVENOUS

## 2016-03-05 MED ORDER — BISACODYL 5 MG PO TBEC
10.0000 mg | DELAYED_RELEASE_TABLET | ORAL | Status: AC
  Administered 2016-03-05: 10 mg via ORAL
  Filled 2016-03-05: qty 2

## 2016-03-05 MED ORDER — FENTANYL CITRATE (PF) 100 MCG/2ML IJ SOLN
INTRAMUSCULAR | Status: AC
  Filled 2016-03-05: qty 2

## 2016-03-05 MED ORDER — LIDOCAINE VISCOUS 2 % MT SOLN
OROMUCOSAL | Status: AC
  Filled 2016-03-05: qty 15

## 2016-03-05 MED ORDER — HEPARIN SOD (PORK) LOCK FLUSH 100 UNIT/ML IV SOLN
INTRAVENOUS | Status: AC
  Filled 2016-03-05: qty 5

## 2016-03-05 MED ORDER — MEPERIDINE HCL 100 MG/ML IJ SOLN
INTRAMUSCULAR | Status: AC
  Filled 2016-03-05: qty 2

## 2016-03-05 MED ORDER — PANTOPRAZOLE SODIUM 40 MG PO TBEC
40.0000 mg | DELAYED_RELEASE_TABLET | Freq: Every day | ORAL | Status: DC
  Administered 2016-03-05 – 2016-03-14 (×10): 40 mg via ORAL
  Filled 2016-03-05 (×11): qty 1

## 2016-03-05 MED ORDER — MIDAZOLAM HCL 5 MG/5ML IJ SOLN
INTRAMUSCULAR | Status: AC
  Filled 2016-03-05: qty 10

## 2016-03-05 MED ORDER — PEG 3350-KCL-NA BICARB-NACL 420 G PO SOLR
4000.0000 mL | ORAL | Status: AC
  Administered 2016-03-05: 4000 mL via ORAL
  Filled 2016-03-05: qty 4000

## 2016-03-05 MED ORDER — NITROGLYCERIN 0.4 MG SL SUBL
SUBLINGUAL_TABLET | SUBLINGUAL | Status: AC
  Administered 2016-03-05: 22:00:00
  Filled 2016-03-05: qty 1

## 2016-03-05 MED ORDER — BOOST / RESOURCE BREEZE PO LIQD
1.0000 | Freq: Three times a day (TID) | ORAL | Status: DC
Start: 2016-03-05 — End: 2016-03-14
  Administered 2016-03-05 – 2016-03-14 (×27): 1 via ORAL

## 2016-03-05 MED ORDER — LIDOCAINE VISCOUS 2 % MT SOLN
OROMUCOSAL | Status: DC | PRN
  Administered 2016-03-05: 1 via OROMUCOSAL

## 2016-03-05 MED ORDER — SODIUM CHLORIDE 0.9 % IV SOLN: INTRAVENOUS | Status: DC

## 2016-03-05 MED ORDER — NITROGLYCERIN 0.4 MG SL SUBL
SUBLINGUAL_TABLET | SUBLINGUAL | Status: AC
  Administered 2016-03-05: 0.4 mg
  Filled 2016-03-05: qty 1

## 2016-03-05 NOTE — Progress Notes (Signed)
Pt left facility via stretcher with [facility] at 2035. Pt alert and orient in no acute distress. Report called to [place] on [facility].

## 2016-03-05 NOTE — Progress Notes (Signed)
Pt coming from [facility] for evaluation adn treatment of persistent GI bleed that has required continued PRBC transfusions.  Spoke to primary [place], Dr. Cindie Laroche Pt undergoing colonoscopy prior to transfer in favor of NM RBC bleed study. Pt will need either sugery eval for possible partial colectomy vs IR for embolization. CCM consulted at [facility] who deferred to [facility] hospitalists for admission.  Pt last dialysis was 03/04/16. Pt to come to [facility] to Stepdown bed when available  Linna Darner, MD [place] 03/05/2016, 1:45 PM

## 2016-03-05 NOTE — Brief Op Note (Addendum)
03/03/2016 - 03/05/2016 NOTE NOT AVAILABLE DUE TO SYSTEM ERROR  5:18 PM  PATIENT:  Randy [name]  82 y.o. male  PRE-OPERATIVE DIAGNOSIS:  GI bleed, acute blood loss anemia  POST-OPERATIVE DIAGNOSIS:  Lower: ascending colon polyps x8 , diverticulosis in ascending colon and descending colon and sigmoid colon, cecal polyp x1, blood in ileum, rectal polyp x1 Upper: hiatal hernia, schatzki's ring, mild gastritis   PROCEDURE:  Procedure(s): COLONOSCOPY (N/A) ESOPHAGOGASTRODUODENOSCOPY (EGD) (N/A)  SURGEON:  Surgeon(s) and Role:    * Danie Binder, MD - Primary  FINDINGS ON TCS: 1. OLD BLOOD IN ILEUM ~ 10 CM VISUALIZED. NO FRESH BLOOD IN LUMEN. 2. FEW FRESH CLOTS IN PROXIMAL ASCENDING COLON.  FREQUENT LARGE MOUTH IN DIVERTICULA IN THE CECUM AND ASCENDING COLON.  3. FREQUENT DIVERTICULA AND OLD CLOTS IN DESCENDING AND SIGMOID COLON. 4. EIGHT POLYPS IN ASCENDING COLON POLYPS-NOT REMOVED 5. HIATAL HERNIA, AND MILD GASTRITIS 6. NO ACTIVE BLEEDING IN STOMACH, SMALL BOWEL, OR COLON   PLAN: 1. TRANSFER TO [place]. 2. NEEDS GIVENS, TAGGED RBC, AND/OR ANGIOGRAPHY IF HE CONTINUES TO BLEED. 3. PROTONIX DAILY. D/C PEPCID.  [id number]: DISCUSSED WITH DR. Teena Irani. EXPLAINED FINDINGS AND TECHNICAL DIFFICULTIES WE ARE CURRENTLY EXPERIENCING.  [id number]: SPOKE WITH DAUGHTER-[name]. WOULD DISCUSS WITH  HIS CARDIOLOGIST THE BENEFITS V. RISKS OF CONTINUED ASA USE.

## 2016-03-05 NOTE — Progress Notes (Signed)
Subjective: Interval History: has complaints of blood in the stool. Patient does state that he has 3 bowel movements this morning and still continued to bleed. He complains of some weakness.  Objective: Vital signs in last 24 hours: Temp:  [97 F (36.1 C)-99 F (37.2 C)] 97.2 F (36.2 C) (06/13 0747) Pulse Rate:  [30-89] 79 (06/13 0600) Resp:  [10-20] 18 (06/13 0500) BP: (75-174)/(46-87) 111/56 mmHg (06/13 0500) SpO2:  [95 %-100 %] 100 % (06/13 0600) Weight:  [60.4 kg (133 lb 2.5 oz)-61.4 kg (135 lb 5.8 oz)] 61.4 kg (135 lb 5.8 oz) (06/13 0500) Weight change: 4.211 kg (9 lb 4.5 oz)  Intake/Output from previous day: 06/12 0701 - 06/13 0700 In: 1680 [P.O.:960; Blood:670; IV Piggyback:50] Out: 2289  Intake/Output this shift:    General appearance: alert, cooperative and no distress Resp: clear to auscultation bilaterally Cardio: regular rate and rhythm, S1, S2 normal, no murmur, click, rub or gallop GI: soft, non-tender; bowel sounds normal; no masses,  no organomegaly Extremities: extremities normal, atraumatic, no cyanosis or edema  Lab Results:  Recent Labs  03/03/16 2002  03/04/16 0441  03/04/16 2005 03/05/16 0407  WBC 6.1  --  5.2  --   --   --   HGB 9.4*  < > 6.9*  < > 9.0* 8.3*  HCT 30.9*  < > 22.8*  < > 28.0* 26.0*  PLT 191  --  165  --   --   --   < > = values in this interval not displayed. BMET:  Recent Labs  03/04/16 0441 03/05/16 0407  NA 138 134*  K 4.5 4.3  CL 101 95*  CO2 26 29  GLUCOSE 141* 95  BUN 83* 42*  CREATININE 9.31* 6.10*  CALCIUM 8.3* 8.7*   No results for input(s): PTH in the last 72 hours. Iron Studies: No results for input(s): IRON, TIBC, TRANSFERRIN, FERRITIN in the last 72 hours.  Studies/Results: No results found.  I have reviewed the patient's current medications.  Assessment/Plan: Problem #1 GI bleeding: Patient is still continue to have bleeding. His being followed by GI. His hemoglobin has declined this morning after  receiving blood transfusion yesterday. Problem #2 end-stage renal disease: He is status post hemodialysis yesterday. Presently he doesn't have any uremic sign and  symptoms Problem #3 hypertension: His blood pressure is reasonably controlled  Problem #4 metabolic bone disease: Calcium is a range but phosphorus is slightly high Problem #5 for fluid management: Patient presently doesn't have any significant sign of fluid overload. We are able to remove about 2200 mL yesterday. Problem #6 anemia: Because of GI bleeding and possibly anemia of chronic disease. Plan: We'll make arrangements for patient to get dialysis tomorrow. We'll hold his binder until the issue of GI bleeding resolves. We'll check his CBC and renal panel in the morning.   LOS: 2 days   Randy [name] S 03/05/2016,8:59 AM

## 2016-03-05 NOTE — Progress Notes (Signed)
Patient transfused 2 units of packed cells hemoglobin was 9.6 now down to 7.1 we'll transfuse an additional 2 units of packed red blood cells monitor hemoglobin and hematocrit every 8 hours patient awaiting bleeding scan. However bleeding scan is been canceled in favor of colonoscopy per Dr. Oneida [name] will await results of colonoscopy this afternoon.  no angina dizziness orthopnea PND Randy [name] [id number] DOB: May 01, 1932 DOA: 03/03/2016 PCP: Randy Curet, MD   Physical Exam: Blood pressure 136/59, pulse 75, temperature 96.9 F (36.1 C), temperature source Oral, resp. rate 19, height 5' (1.524 m), weight 61.4 kg (135 lb 5.8 oz), SpO2 100 %. Lungs clear to A&P no rales wheeze rhonchi heart regular rhythm no murmurs Sees feels rubs abdomen soft nontender bowel sounds normoactive   Investigations:  Recent Results (from the past 240 hour(s))  MRSA PCR Screening     Status: None   Collection Time: 03/03/16 10:09 PM  Result Value Ref Range Status   MRSA by PCR NEGATIVE NEGATIVE Final    Comment:        The GeneXpert MRSA Assay (FDA approved for NASAL specimens only), is one component of a comprehensive MRSA colonization surveillance program. It is not intended to diagnose MRSA infection nor to guide or monitor treatment for MRSA infections.      Basic Metabolic Panel:  Recent Labs  03/04/16 0441 03/05/16 0407  NA 138 134*  K 4.5 4.3  CL 101 95*  CO2 26 29  GLUCOSE 141* 95  BUN 83* 42*  CREATININE 9.31* 6.10*  CALCIUM 8.3* 8.7*  PHOS  --  5.5*   Liver Function Tests:  Recent Labs  03/04/16 0441 03/05/16 0407  AST 12*  --   ALT 8*  --   ALKPHOS 53  --   BILITOT 0.5  --   PROT 5.1*  --   ALBUMIN 2.4* 2.5*     CBC:  Recent Labs  03/03/16 2002  03/04/16 0441  03/05/16 0407 03/05/16 0946  WBC 6.1  --  5.2  --   --   --   NEUTROABS 3.9  --   --   --   --   --   HGB 9.4*  < > 6.9*  < > 8.3* 7.1*  HCT 30.9*  < > 22.8*  < > 26.0* 22.1*  MCV 92.0  --   91.2  --   --   --   PLT 191  --  165  --   --   --   < > = values in this interval not displayed.  No results found.    Medications:   Impression:  Principal Problem:   GI bleeding Active Problems:   ESRD on dialysis [facility])   Essential hypertension   GI bleed   Hyperglycemia   Coronary artery disease involving native coronary artery of native heart with angina pectoris (HCC)   Hyperlipidemia   Cardiomyopathy, ischemic   Aortic stenosis     Plan: Patient have intermittent colonoscopy will transfuse an additional 2 units of packed cells patient after discussion with general surgery Dr. Aviva [name] he feels he will need an arteriogram if surgery is warranted cannot be done at this institution therefore will transfer to [facility] after colonoscopy thank you  Consultants: Gastroenterology, nephrology   Procedures   Antibiotics:          Time spent: 30 minutes   LOS: 2 days   Randy [name] M   03/05/2016, 12:39 PM

## 2016-03-05 NOTE — H&P (Signed)
Primary Care Physician:  [name], MD Primary Gastroenterologist:  Dr. Oneida Alar  Pre-Procedure History & Physical: HPI:  Randy [name] is a 82 y.o. male here for rectal bleeding.  Past Medical History  Diagnosis Date  . Hypercholesterolemia   . Gout   . Nonischemic cardiomyopathy (HCC)     LVEF 35-40%  . Anemia of chronic disease   . Essential hypertension   . Arthritis   . Peripheral vascular disease ([place])     Status post right below knee amputation for a nonhealing wound of the right foot 12/2014  . History of pneumonia 2014  . CAD (coronary artery disease)     Moderate multivessel disease 07/2015 - managed medically  . History of myopericarditis 2015    07/2015  . History of stroke   . ESRD on hemodialysis [facility])     M/W/F in [place] - Dr. Florene Glen  . History of blood transfusion   . Pneumonia     Past Surgical History  Procedure Laterality Date  . Knee arthroscopy Right 2007  . Back surgery    . Hemorrhoid surgery  1970's  . Ganglion cyst excision  01/03/2012    Procedure: REMOVAL GANGLION OF WRIST;  Surgeon: Scherry Ran, MD;  Location: [facility];  Service: General;  Laterality: Right;  . Colonoscopy      remote past by Dr. Tamala Julian in [place], [place]   . Av fistula placement  08/24/2012    Procedure: ARTERIOVENOUS (AV) FISTULA CREATION;  Surgeon: Rosetta Posner, MD;  Location: [place];  Service: Vascular;  Laterality: Left;  . Insertion of dialysis catheter Right      neck  . Insertion of dialysis catheter  10/19/2012    Procedure: INSERTION OF DIALYSIS CATHETER;  Surgeon: Rosetta Posner, MD;  Location: [place];  Service: Vascular;  Laterality: N/A;  REMOVE TEMPORARY CATH  . Cholecystectomy    . Lumbar disc surgery  2004  . Left heart catheterization with coronary angiogram N/A 08/17/2014    Procedure: LEFT HEART CATHETERIZATION WITH CORONARY ANGIOGRAM;  Surgeon: Larey Dresser, MD;  Location: [facility] CATH LAB;  Service: Cardiovascular;  Laterality: N/A;  . Colonoscopy  Left 09/26/2014    Dr. Paulita Fujita: old and semi-fresh blood scattered throughout the colon, several medium-sized diverticula, no obvious focal bleeding site, no blood in TI, several polyps seen in cecum, ascending colon and proximal transverse s/p biopsy (tubular adenomas)  . Amputation Right 01/05/2015    BKA  . Capd removal N/A 03/13/2015    Procedure: CONTINUOUS AMBULATORY PERITONEAL DIALYSIS  (CAPD) CATHETER REMOVAL;  Surgeon: Coralie Keens, MD;  Location: [place];  Service: General;  Laterality: N/A;  . Cardiac catheterization    . Above knee leg amputation Left 10/10/2015  . Amputation Left 10/10/2015    Procedure: AMPUTATION ABOVE KNEE LEFT;  Surgeon: Angelia Mould, MD;  Location: [place];  Service: Vascular;  Laterality: Left;    Prior to Admission medications   Medication Sig Start Date End Date Taking? Authorizing Provider  amLODipine (NORVASC) 10 MG tablet Take 0.5 tablets (5 mg total) by mouth daily. 10/31/15  Yes Ivan Anchors Love, PA-C  aspirin 81 MG tablet Take 81 mg by mouth daily.   Yes Historical Provider, MD  atorvastatin (LIPITOR) 80 MG tablet Take 1 tablet (80 mg total) by mouth daily. 11/02/15  Yes Arnoldo Lenis, MD  calcitRIOL (ROCALTROL) 0.5 MCG capsule Take 1 capsule (0.5 mcg total) by mouth daily. 11/10/15  Yes Lucia Gaskins, MD  carvedilol (COREG) 12.5 MG tablet Take 1 tablet (12.5 mg total) by mouth 2 (two) times daily with a meal. 12/20/15  Yes Ripudeep K Rai, MD  docusate sodium (COLACE) 100 MG capsule Take 2 capsules (200 mg total) by mouth daily. Patient taking differently: Take 200 mg by mouth daily as needed for mild constipation or moderate constipation.  10/31/15  Yes Ivan Anchors Love, PA-C  gabapentin (NEURONTIN) 100 MG capsule Take 1 capsule (100 mg total) by mouth at bedtime. 10/23/15  Yes Barton Dubois, MD  hydrALAZINE (APRESOLINE) 25 MG tablet Tke 3 tabs (75mg ) TID (3x /day) on non-dialysis days and 50mg  (2tabs) TID on HD days. Patient taking differently: Take 25  mg by mouth 3 (three) times daily. Does not take until arrives back home on dialysis days (MWF) 12/20/15  Yes Ripudeep Krystal Eaton, MD  isosorbide mononitrate (IMDUR) 30 MG 24 hr tablet Take 0.5 tablets (15 mg total) by mouth at bedtime. 09/20/15  Yes Arnoldo Lenis, MD  lidocaine-prilocaine (EMLA) cream Apply 1 application topically every Monday, Wednesday, and Friday. For dialysis 10/06/15  Yes Historical Provider, MD  lisinopril (PRINIVIL,ZESTRIL) 40 MG tablet Take 1 tablet (40 mg total) by mouth at bedtime. 12/20/15  Yes Ripudeep K Rai, MD  LUMIGAN 0.01 % SOLN Place 1 drop into both eyes at bedtime. 09/18/15  Yes Historical Provider, MD  multivitamin (RENA-VIT) TABS tablet Take 1 tablet by mouth every Monday, Wednesday, and Friday. Takes on dialysis days   Yes Historical Provider, MD  nitroGLYCERIN (NITROSTAT) 0.4 MG SL tablet Place 1 tablet (0.4 mg total) under the tongue every 5 (five) minutes as needed for chest pain. 12/20/15  Yes Ripudeep Krystal Eaton, MD  oxyCODONE (OXY IR/ROXICODONE) 5 MG immediate release tablet Take 1 tablet (5 mg total) by mouth every 6 (six) hours as needed for moderate pain or severe pain. 10/31/15  Yes Ivan Anchors Love, PA-C  pantoprazole (PROTONIX) 40 MG tablet Take 1 tablet (40 mg total) by mouth daily at 12 noon. 10/23/15  Yes Barton Dubois, MD  polyvinyl alcohol (LIQUIFILM TEARS) 1.4 % ophthalmic solution Place 1 drop into both eyes as needed for dry eyes. 10/31/15  Yes Ivan Anchors Love, PA-C  sevelamer carbonate (RENVELA) 800 MG tablet Take 1,600-2,400 mg by mouth See admin instructions. Take 3 tablets (2400 mg) with meals and 2 tablets (1600 mg) with snacks   Yes Historical Provider, MD  SIMBRINZA 1-0.2 % SUSP Place 1 drop into both eyes 2 (two) times daily. 09/18/15  Yes Historical Provider, MD    Allergies as of 03/03/2016  . (No Known Allergies)    Family History  Problem Relation Age of Onset  . Arthritis    . Cancer    . Kidney disease    . Anesthesia problems Neg Hx   .  Hypotension Neg Hx   . Malignant hyperthermia Neg Hx   . Pseudochol deficiency Neg Hx   . Cancer Sister   . Colon cancer Brother     unclear   . Colon cancer Brother   . Stroke Mother     Social History   Social History  . Marital Status: Married    Spouse Name: N/A  . Number of Children: N/A  . Years of Education: 10   Occupational History  .     Social History Main Topics  . Smoking status: Former Smoker -- 1.00 packs/day for 30 years    Types: Cigarettes    Quit date: 08/19/1998  . Smokeless tobacco:  Former User    Types: Chew    Quit date: 12/31/1993  . Alcohol Use: No  . Drug Use: No  . Sexual Activity: No   Other Topics Concern  . Not on file   Social History Narrative    Review of Systems: See HPI, otherwise negative ROS   Physical Exam: BP 107/59 mmHg  Pulse 70  Temp(Src) 98.3 F (36.8 C) (Oral)  Resp 15  Ht 5' (1.524 m)  Wt 135 lb 5.8 oz (61.4 kg)  BMI 26.44 kg/m2  SpO2 100% General:   Alert,  pleasant and cooperative in NAD Head:  Normocephalic and atraumatic. Neck:  Supple; Lungs:  Clear throughout to auscultation.    Heart:  Regular rate and rhythm. Abdomen:  Soft, nontender and nondistended. Normal bowel sounds, without guarding, and without rebound.   Neurologic:  Alert and  oriented x4;  grossly normal neurologically.  Impression/Plan:     RECTAL BLEEDING  PLAN:  1. TCS/ possible EGD TODAY

## 2016-03-05 NOTE — Progress Notes (Addendum)
Discussed with Dr. Arnoldo Morale patient's presentation. Telephone consult. He is felt to be a poor surgical candidate. If surgery needed, would recommend [facility] transfer. He recommended an INR if surgery anticipated. Lab unable to draw due to multiple sticks.   At 1100: after discussion with Dr. Oneida Alar, bleeding scan cancelled. Proceed with colonoscopy +/- EGD IF FAMILY IS AGREEABLE. I called [name], patient's daughter at [phone]. SHE IS AGREEABLE to patient proceeding with endoscopic evaluation here. She stated she "just wants something done" and is concerned due to his  issues of dialysis, heart history, and recurrent bleeding. She is aware that he will be prepping today for a colonoscopy +/- EGD.  Cardiology consultation already on file, and his overall risk for procedures is increased due to comorbidities but no specific cardiac contraindication. Dulcolax tablet and Golytely ordered per Dr. Oneida Alar. Nursing aware.   Orvil Feil, ANP-BC [facility]

## 2016-03-05 NOTE — Progress Notes (Signed)
1520 Infusion complete. Didn't document transfusion complete until 1529. Got vitals at 1520. No reactions noted in patient. VSS.

## 2016-03-05 NOTE — Progress Notes (Signed)
Primary cardiologist: Dr. Carlyle Dolly  Seen for followup: CAD, abnormal troponin I  Subjective:    No chest pain this morning. Feels dizzy however with recent hypotension. Reports having recurrent stools with GI bleeding (Gastroenterology service aware).  Objective:   Temp:  [97 F (36.1 C)-99 F (37.2 C)] 97.2 F (36.2 C) (06/13 0747) Pulse Rate:  [30-89] 79 (06/13 0600) Resp:  [10-20] 18 (06/13 0500) BP: (75-174)/(46-78) 111/56 mmHg (06/13 0500) SpO2:  [95 %-100 %] 100 % (06/13 0600) Weight:  [133 lb 2.5 oz (60.4 kg)-135 lb 5.8 oz (61.4 kg)] 135 lb 5.8 oz (61.4 kg) (06/13 0500) Last BM Date: 03/04/16  Filed Weights   03/04/16 0500 03/04/16 1035 03/05/16 0500  Weight: 132 lb 15 oz (60.3 kg) 133 lb 2.5 oz (60.4 kg) 135 lb 5.8 oz (61.4 kg)    Intake/Output Summary (Last 24 hours) at 03/05/16 1016 Last data filed at 03/05/16 0000  Gross per 24 hour  Intake   1345 ml  Output   2289 ml  Net   -944 ml    Telemetry: Sinus rhythm with blocked PACs.  Exam:  General: Chronically ill-appearing male, no acute distress.  Lungs: Decreased breath sounds without crackles.  Cardiac: RRR with ectopy and 3/6 systolic murmur.  Abdomen: No rebound, bowel sounds present.  Extremities: Status post right BKA and left AKA. Left arm AV fistula.  Lab Results:  Basic Metabolic Panel:  Recent Labs Lab 03/03/16 2002 03/03/16 2030 03/04/16 0441 03/05/16 0407  NA 138 139 138 134*  K 4.3 4.2 4.5 4.3  CL 97* 97* 101 95*  CO2 28  --  26 29  GLUCOSE 138* 134* 141* 95  BUN 79* 73* 83* 42*  CREATININE 9.16* 9.10* 9.31* 6.10*  CALCIUM 8.8*  --  8.3* 8.7*    Liver Function Tests:  Recent Labs Lab 03/04/16 0441 03/05/16 0407  AST 12*  --   ALT 8*  --   ALKPHOS 53  --   BILITOT 0.5  --   PROT 5.1*  --   ALBUMIN 2.4* 2.5*    CBC:  Recent Labs Lab 03/03/16 2002  03/04/16 0441  03/04/16 2005 03/05/16 0407 03/05/16 0946  WBC 6.1  --  5.2  --   --   --   --     HGB 9.4*  < > 6.9*  < > 9.0* 8.3* 7.1*  HCT 30.9*  < > 22.8*  < > 28.0* 26.0* 22.1*  MCV 92.0  --  91.2  --   --   --   --   PLT 191  --  165  --   --   --   --   < > = values in this interval not displayed.  Cardiac Enzymes:  Recent Labs Lab 03/03/16 2002 03/04/16 0441 03/04/16 1100  TROPONINI 0.09* 0.10* 0.11*   Echocardiogram 08/04/2015: Study Conclusions  - Left ventricle: The cavity size was normal. Wall thickness was  increased increased in a pattern of mild to moderate LVH.  Incidentally noted transverse false tendon in LV. Systolic  function was moderately to severely reduced. The estimated  ejection fraction was in the range of 30% to 35%. Diffuse  hypokinesis. There is severe hypokinesis of the  basal-midinferolateral and inferior myocardium. Doppler  parameters are consistent with restrictive physiology, indicative  of decreased left ventricular diastolic compliance and/or  increased left atrial pressure. - Aortic valve: Moderately calcified annulus. Trileaflet; mildly  calcified leaflets. Cusp separation was reduced.  There was  moderate stenosis. There was trivial regurgitation. Mean gradient  (S): 16 mm Hg. VTI ratio of LVOT to aortic valve: 0.4. Valve area  (VTI): 1.24 cm^2. Valve area (Vmax): 1.21 cm^2. - Mitral valve: Calcified annulus. There was trivial regurgitation. - Left atrium: The atrium was moderately dilated. - Right atrium: The atrium was moderately to severely dilated.  Central venous pressure (est): 3 mm Hg. - Atrial septum: No defect or patent foramen ovale was identified. - Tricuspid valve: There was mild regurgitation. - Pulmonary arteries: PA peak pressure: 38 mm Hg (S). - Pericardium, extracardiac: There was no pericardial effusion.  Impressions:  - Mild to moderate LVH with LVEF approximately 30%, diffuse  hypokinesis, most severe in the mid to basal inferior and  inferolateral wall. Diastolic filling pattern  consistent with  restrictive physiology. Moderate left atrial enlargement.  Moderate calcific aortic stenosis with trivial aortic  regurgitation. Mild tricuspid regurgitation with PASP 38 mmHg.  Moderate to severe right atrial enlargement. Compared to the  previous study from December 2015, there has been further  reduction in LVEF, and progression in degree of aortic stenosis.  Cardiac catheterization 08/17/2014: Coronary dominance: right  Left mainstem: Short, no significant disease.   Left anterior descending (LAD): There was a moderate D1 that branched early with 40% ostial stenosis. This was followed by a large septal perforator. There was 40-50% proximal LAD stenosis at the takeoff of the septal perforator. 30% mid LAD stenosis at D2.   Left circumflex (LCx): There was a large ramus that branched early into a superior and and inferior division, both vessels were moderate in size. Both divisions of the ramus had relatively up to 80% proximal stenosis. The AV LCx itself had luminal irregularities.   Right coronary artery (RCA): There was an early large acute marginal with 40% proximal and 40% mid vessel stenosis. The RCA had diffuse luminal irregularities and up to 40% mid vessel stenosis.   Medications:   Scheduled Medications: . sodium chloride   Intravenous Once  . amLODipine  5 mg Oral Daily  . aspirin EC  81 mg Oral Daily  . atorvastatin  80 mg Oral Daily  . brinzolamide  1 drop Both Eyes BID   And  . brimonidine  1 drop Both Eyes BID  . calcitRIOL  0.5 mcg Oral Daily  . carvedilol  12.5 mg Oral BID WC  . famotidine (PEPCID) IV  20 mg Intravenous Q24H  . heparin lock flush      . isosorbide mononitrate  15 mg Oral Daily  . latanoprost  1 drop Both Eyes QHS  . multivitamin  1 tablet Oral Daily  . sevelamer carbonate  1,600 mg Oral With snacks  . sodium chloride  250 mL Intravenous Once  . sodium chloride flush  3 mL Intravenous Q12H    PRN  Medications: sodium chloride, sodium chloride, acetaminophen **OR** acetaminophen, gabapentin, lidocaine (PF), lidocaine-prilocaine, oxyCODONE, pentafluoroprop-tetrafluoroeth, polyvinyl alcohol   Assessment:   1. Mildly abnormal troponin I consistent with demand ischemia in light of comorbid illnesses and physiologic stressors.  2. Moderate multivessel CAD by cardiac catheterization in 2015 that has been managed medically. He has regular recurring angina. Outpatient medical regimen includes aspirin, Coreg, lisinopril, Norvasc, and Imdur.  3. Hyperlipidemia, on Lipitor.  4. Ischemic cardiomyopathy with LVEF approximately 30%.  5. Moderate calcific aortic stenosis.  6. ESRD on hemodialysis.  7. Recurrent GI bleed associated with hypotension this morning. Gastroenterology is managing, colonoscopy deferred so far with plan for  possible tagged RBC scan. He is getting repeat PRBC transfusion and fluid bolus.  Plan/Discussion:    From cardiac perspective would continue medical therapy as tolerated, although we will stop aspirin for now and may need to hold some of his antianginal or antihypertensive medications in light of low blood pressures. Consider transfer to [facility] for further management if not able to better stabilize bleeding or if there are concerns about pursuing endoscopic evaluation here. He remains at increased risk for adverse outcomes in light of advanced comorbid disease. Discussed with nursing and  Ms. Sams NP with GI service.   Satira Sark, M.D., F.A.C.C.

## 2016-03-05 NOTE — Progress Notes (Signed)
    Subjective: Still with rectal bleeding. Hgb down to 8.3, bright red blood per rectum.   Objective: Vital signs in last 24 hours: Temp:  [96.9 F (36.1 C)-99 F (37.2 C)] 97.2 F (36.2 C) (06/13 0747) Pulse Rate:  [30-89] 79 (06/13 0600) Resp:  [10-20] 18 (06/13 0500) BP: (75-174)/(46-87) 111/56 mmHg (06/13 0500) SpO2:  [95 %-100 %] 100 % (06/13 0600) Weight:  [133 lb 2.5 oz (60.4 kg)-135 lb 5.8 oz (61.4 kg)] 135 lb 5.8 oz (61.4 kg) (06/13 0500) Last BM Date: 03/04/16 General:   Alert and oriented, pleasant Head:  Normocephalic and atraumatic. Abdomen:  Bowel sounds present, soft, non-tender, non-distended. No HSM or hernias noted.  Msk: Left AKA, right BKA  Psych:  Alert and cooperative. Normal mood and affect.  Intake/Output from previous day: 06/12 0701 - 06/13 0700 In: 1680 [P.O.:960; Blood:670; IV Piggyback:50] Out: 2289  Intake/Output this shift:    Lab Results:  Recent Labs  03/03/16 2002  03/04/16 0441 03/04/16 1410 03/04/16 2005 03/05/16 0407  WBC 6.1  --  5.2  --   --   --   HGB 9.4*  < > 6.9* 9.6* 9.0* 8.3*  HCT 30.9*  < > 22.8* 30.3* 28.0* 26.0*  PLT 191  --  165  --   --   --   < > = values in this interval not displayed. BMET  Recent Labs  03/03/16 2002 03/03/16 2030 03/04/16 0441 03/05/16 0407  NA 138 139 138 134*  K 4.3 4.2 4.5 4.3  CL 97* 97* 101 95*  CO2 28  --  26 29  GLUCOSE 138* 134* 141* 95  BUN 79* 73* 83* 42*  CREATININE 9.16* 9.10* 9.31* 6.10*  CALCIUM 8.8*  --  8.3* 8.7*   LFT  Recent Labs  03/04/16 0441 03/05/16 0407  PROT 5.1*  --   ALBUMIN 2.4* 2.5*  AST 12*  --   ALT 8*  --   ALKPHOS 53  --   BILITOT 0.5  --    Hepatitis Panel  Recent Labs  03/04/16 1100  HEPBSAG Negative    Assessment: 82 year old male with extensive medical history, presenting with acute onset of painless, large-volume hematochezia likely diverticular in origin. Does not appear to be an ischemic presentation or rapid transit upper GI  bleed. Acute blood loss anemia secondary to GI bleed. Notably, he was hospitalized in Dec/Jan 2016 with similar presentation and underwent a colonoscopy by Dr. Paulita Fujita and felt to likely be diverticular in origin. Cardiology evaluation noted and appreciated. Nursing reports continued active bleeding this morning; Hgb dropped from  9 yesterday to 8.3. Will follow serial H/H and stat bleeding scan as per plan yesterday. Keep 4 units ahead. I have spoken with the blood bank who states 2 units have been allocated for him: will order an additional 2 to keep on hold. NPO.   As of note, family has spoken with nursing staff and adamantly requesting transfer to [facility].   Plan: Serial H/H Type and cross, keep 4 units ahead (2 already available) Bleeding scan ordered stat H/H now  NPO Further recommendations to follow   Orvil Feil, ANP-BC [facility]astroenter[facility]    LOS: 2 days    03/05/2016, 8:10 AM

## 2016-03-05 NOTE — Progress Notes (Signed)
Pt arrived to 2C01 at 2100.  Pt is al/ox4, complaining of burning across the chest.  10/10 pain.  Pt is laying in bed comfortably, in no acute distress, but complaining of chest pain. Pt says he takes a nigh pill for the burning and when it gets bad he takes a nitro pill.  Daughter became quite frantic that RN wasn't running to get a nitro pill.  After assessing the pt, and checking BP which was 127/72 one sublingual nitro was given. EKG completed that showed NSR/1st degree HB. Pt stated he would like 2 pills, RN told him he would have one and then we would reassess his chest pain.  After a few minutes the chest pain had resolved and pt was given his PRN gabapentin and oxy for his phantom limb pain.   Pt and daughter are very concerned that his isosorbide mononitrate is scheduled as a daily med when the pt takes it before bed at home. He had it at 0900 this morning.  Also, due to low blood pressure earlier in the day his carvedilol and amLODipine were held and the second dose of carvedilol is not documented.  BP immediately before giving nitro was 186/78 at 2126 and again at 2200 BP was 186/79.  TRH paged about these issues, RN will continue to monitor.

## 2016-03-05 NOTE — Progress Notes (Signed)
Hgb 7.1, down from 8.3 this morning. Due to active bleeding noted this morning and hypotension, ordered 1 unit PRBCs. 4 units have been ordered ON HOLD in blood bank. Tagged RBC ordered; IV had to be placed.  Orvil Feil, ANP-BC [facility]enter[facility]

## 2016-03-06 ENCOUNTER — Encounter (HOSPITAL_COMMUNITY): Payer: Self-pay | Admitting: Gastroenterology

## 2016-03-06 ENCOUNTER — Inpatient Hospital Stay (HOSPITAL_COMMUNITY): Payer: Medicare Other

## 2016-03-06 DIAGNOSIS — I2510 Atherosclerotic heart disease of native coronary artery without angina pectoris: Secondary | ICD-10-CM | POA: Diagnosis present

## 2016-03-06 DIAGNOSIS — K625 Hemorrhage of anus and rectum: Secondary | ICD-10-CM | POA: Diagnosis present

## 2016-03-06 LAB — TYPE AND SCREEN
ABO/RH(D): O POS
ANTIBODY SCREEN: NEGATIVE
UNIT DIVISION: 0
UNIT DIVISION: 0
UNIT DIVISION: 0
Unit division: 0

## 2016-03-06 LAB — CBC WITH DIFFERENTIAL/PLATELET
BASOS PCT: 0 %
Basophils Absolute: 0 10*3/uL (ref 0.0–0.1)
Basophils Absolute: 0 10*3/uL (ref 0.0–0.1)
Basophils Relative: 0 %
EOS ABS: 0.1 10*3/uL (ref 0.0–0.7)
EOS PCT: 2 %
Eosinophils Absolute: 0.1 10*3/uL (ref 0.0–0.7)
Eosinophils Relative: 2 %
HEMATOCRIT: 20 % — AB (ref 39.0–52.0)
HEMATOCRIT: 18 % — AB (ref 39.0–52.0)
Hemoglobin: 6.5 g/dL — CL (ref 13.0–17.0)
Hemoglobin: 5.8 g/dL — CL (ref 13.0–17.0)
LYMPHS PCT: 20 %
Lymphocytes Relative: 17 %
Lymphs Abs: 1.1 10*3/uL (ref 0.7–4.0)
Lymphs Abs: 0.7 10*3/uL (ref 0.7–4.0)
MCH: 27.4 pg (ref 26.0–34.0)
MCH: 27.4 pg (ref 26.0–34.0)
MCHC: 32.5 g/dL (ref 30.0–36.0)
MCHC: 32.2 g/dL (ref 30.0–36.0)
MCV: 84.4 fL (ref 78.0–100.0)
MCV: 84.9 fL (ref 78.0–100.0)
MONO ABS: 0.5 10*3/uL (ref 0.1–1.0)
MONO ABS: 0.5 10*3/uL (ref 0.1–1.0)
MONOS PCT: 10 %
MONOS PCT: 12 %
NEUTROS ABS: 3.6 10*3/uL (ref 1.7–7.7)
NEUTROS ABS: 2.9 10*3/uL (ref 1.7–7.7)
Neutrophils Relative %: 68 %
Neutrophils Relative %: 68 %
Platelets: 128 10*3/uL — ABNORMAL LOW (ref 150–400)
Platelets: 115 10*3/uL — ABNORMAL LOW (ref 150–400)
RBC: 2.37 MIL/uL — ABNORMAL LOW (ref 4.22–5.81)
RBC: 2.12 MIL/uL — ABNORMAL LOW (ref 4.22–5.81)
RDW: 17.7 % — AB (ref 11.5–15.5)
RDW: 17.7 % — ABNORMAL HIGH (ref 11.5–15.5)
WBC: 5.3 10*3/uL (ref 4.0–10.5)
WBC: 4.2 10*3/uL (ref 4.0–10.5)

## 2016-03-06 LAB — RENAL FUNCTION PANEL
Albumin: 2 g/dL — ABNORMAL LOW (ref 3.5–5.0)
Anion gap: 11 (ref 5–15)
BUN: 57 mg/dL — ABNORMAL HIGH (ref 6–20)
CO2: 19 mmol/L — ABNORMAL LOW (ref 22–32)
Calcium: 7.8 mg/dL — ABNORMAL LOW (ref 8.9–10.3)
Chloride: 101 mmol/L (ref 101–111)
Creatinine, Ser: 8.4 mg/dL — ABNORMAL HIGH (ref 0.61–1.24)
GFR calc Af Amer: 6 mL/min — ABNORMAL LOW (ref >60)
GFR calc non Af Amer: 5 mL/min — ABNORMAL LOW (ref >60)
Glucose, Bld: 145 mg/dL — ABNORMAL HIGH (ref 65–99)
Phosphorus: 8 mg/dL — ABNORMAL HIGH (ref 2.5–4.6)
Potassium: 4.3 mmol/L (ref 3.5–5.1)
Sodium: 131 mmol/L — ABNORMAL LOW (ref 135–145)

## 2016-03-06 LAB — HEMOGLOBIN AND HEMATOCRIT, BLOOD
HCT: 27 % — ABNORMAL LOW (ref 39.0–52.0)
Hemoglobin: 8.7 g/dL — ABNORMAL LOW (ref 13.0–17.0)

## 2016-03-06 LAB — PREPARE RBC (CROSSMATCH)

## 2016-03-06 MED ORDER — TECHNETIUM TC 99M-LABELED RED BLOOD CELLS IV KIT
25.0000 | PACK | Freq: Once | INTRAVENOUS | Status: AC | PRN
  Administered 2016-03-06: 25 via INTRAVENOUS

## 2016-03-06 MED ORDER — GI COCKTAIL ~~LOC~~
30.0000 mL | Freq: Once | ORAL | Status: AC
  Administered 2016-03-06: 30 mL via ORAL
  Filled 2016-03-06: qty 30

## 2016-03-06 MED ORDER — PRO-STAT SUGAR FREE PO LIQD
30.0000 mL | Freq: Two times a day (BID) | ORAL | Status: DC
  Administered 2016-03-06 – 2016-03-08 (×5): 30 mL via ORAL
  Administered 2016-03-09: 1 mL via ORAL
  Administered 2016-03-09 – 2016-03-14 (×10): 30 mL via ORAL
  Filled 2016-03-06 (×15): qty 30

## 2016-03-06 MED ORDER — ATORVASTATIN CALCIUM 80 MG PO TABS
80.0000 mg | ORAL_TABLET | Freq: Every day | ORAL | Status: DC
  Administered 2016-03-06 – 2016-03-13 (×8): 80 mg via ORAL
  Filled 2016-03-06 (×8): qty 1

## 2016-03-06 MED ORDER — SODIUM CHLORIDE 0.9 % IV SOLN: Freq: Once | INTRAVENOUS | Status: DC

## 2016-03-06 MED ORDER — NITROGLYCERIN 0.4 MG SL SUBL
SUBLINGUAL_TABLET | SUBLINGUAL | Status: AC
  Filled 2016-03-06: qty 1

## 2016-03-06 MED ORDER — CALCITRIOL 0.5 MCG PO CAPS
ORAL_CAPSULE | ORAL | Status: AC
  Filled 2016-03-06: qty 3

## 2016-03-06 MED ORDER — NITROGLYCERIN 0.4 MG SL SUBL
SUBLINGUAL_TABLET | SUBLINGUAL | Status: AC
  Administered 2016-03-06: 0.4 mg
  Filled 2016-03-06: qty 1

## 2016-03-06 MED ORDER — SEVELAMER CARBONATE 800 MG PO TABS
800.0000 mg | ORAL_TABLET | ORAL | Status: DC
  Administered 2016-03-06 – 2016-03-12 (×11): 800 mg via ORAL
  Filled 2016-03-06 (×12): qty 1

## 2016-03-06 MED ORDER — CALCITRIOL 0.5 MCG PO CAPS
1.5000 ug | ORAL_CAPSULE | ORAL | Status: DC
  Administered 2016-03-06 – 2016-03-13 (×4): 1.5 ug via ORAL
  Filled 2016-03-06 (×3): qty 3

## 2016-03-06 MED ORDER — NITROGLYCERIN 0.4 MG SL SUBL
0.4000 mg | SUBLINGUAL_TABLET | SUBLINGUAL | Status: DC | PRN
Start: 2016-03-06 — End: 2016-03-14
  Administered 2016-03-06 – 2016-03-07 (×5): 0.4 mg via SUBLINGUAL
  Filled 2016-03-06 (×2): qty 1

## 2016-03-06 MED ORDER — SODIUM CHLORIDE 0.9 % IV SOLN: 100.0000 mL | INTRAVENOUS | Status: DC | PRN

## 2016-03-06 MED ORDER — INSULIN ASPART 100 UNIT/ML ~~LOC~~ SOLN
0.0000 [IU] | SUBCUTANEOUS | Status: DC
  Administered 2016-03-07 – 2016-03-08 (×2): 2 [IU] via SUBCUTANEOUS
  Administered 2016-03-09: 1 [IU] via SUBCUTANEOUS
  Administered 2016-03-10: 2 [IU] via SUBCUTANEOUS
  Administered 2016-03-10 – 2016-03-11 (×3): 1 [IU] via SUBCUTANEOUS

## 2016-03-06 NOTE — Progress Notes (Signed)
PROGRESS NOTE    Randy [name]  [id number] DOB: 26-Feb-1932 DOA: 03/03/2016 PCP: [name], Randy [name]   Brief Narrative:  82 y.o. BM PMHx CVA, ESRD on HD (M, W, F) , HTN,Nonischemic cardiomyopathy LVEF 35-40% , Myocarditis, PVD, CAD multiple native artery, HLD, Hypertension, Anemia, S/P Right BKA  Apparently c/o bleeding per rectum starting this afternoon. Pt states that there was blood on the toilet paper and the toilet bowel. +n/v x1, No blood. Denies fever, chills, abd pain, diarrhea, black stool.    Assessment & Plan:   Principal Problem:   GI bleeding Active Problems:   ESRD on dialysis [facility])   Essential hypertension   GI bleed   Hyperglycemia   Coronary artery disease involving native coronary artery of native heart with angina pectoris (HCC)   Hyperlipidemia   Cardiomyopathy, ischemic   Aortic stenosis   Gastrointestinal hemorrhage associated with anorectal source   CAD in native artery   Lower GI bleed -6/14 Transfuse 2 units PRBC - Patient being worked up by GI, they have ordered nuclear bleeding scan and if positive probable angiogram by interventional radiology. Will await recommendations -H/H QID   ESRD on HD M/W/F -Patient scheduled for HD today  Cardiomyopathy, ischemic -Strict in and out -Daily weight  Filed Weights   03/06/16 0238 03/06/16 1510 03/06/16 1934  Weight: 61.19 kg (134 lb 14.4 oz) 62.6 kg (138 lb 0.1 oz) 61.2 kg (134 lb 14.7 oz)  -Transfuse for hemoglobin<8  Aortic stenosis -  HTN -Will allow permissive hypertension in order to facilitate HD and secondary to patient's ongoing GI bleed -Coreg 12.5 mg BID -Imdur 50 mg QHS  CAD native artery with angina -See hypertension -NTG PRN  -See cardiomyopathy  Hyperglycemia -Sensitive SSI  HLD -Lipitor 80 mg daily -Lipid panel pending  Right BKA -Stable     DVT prophylaxis: SCD Code Status: Full Family Communication: None available Disposition Plan: Per  GI   Consultants:  Dr.Robert Schertz nephrology Dr.Marc Magod GI   Procedures/Significant Events:  6/12 transfused 2 units PRBC 6/13 transfused 2 units PRBC 6/13 colonoscopy;ascending colon polyps x8 , diverticulosis in ascending colon and descending colon and sigmoid colon, cecal polyp x1, blood in ileum, rectal polyp x1 6/13 EGD;hiatal hernia, schatzki's ring, mild gastritis 6/14 Transfuse 2 units PRBC  Cultures NA  Antimicrobials: NA   Devices NA   LINES / TUBES:  NA    Continuous Infusions:    Subjective: 6/14 A/O 4, NAD. Negative abdominal pain, negative CP, negative SOB. States just began to have bright red blood per rectum over the weekend.    Objective: Filed Vitals:   03/06/16 1815 03/06/16 1830 03/06/16 1900 03/06/16 1934  BP: 110/64 146/82 154/69 154/70  Pulse: 71 71 86 89  Temp: 98 F (36.7 C) 98 F (36.7 C)  98.4 F (36.9 C)  TempSrc:    Oral  Resp:  15  20  Height:      Weight:    61.2 kg (134 lb 14.7 oz)  SpO2:  100% 100% 100%    Intake/Output Summary (Last 24 hours) at 03/06/16 2032 Last data filed at 03/06/16 1934  Gross per 24 hour  Intake   1807 ml  Output   1590 ml  Net    217 ml   Filed Weights   03/06/16 0238 03/06/16 1510 03/06/16 1934  Weight: 61.19 kg (134 lb 14.4 oz) 62.6 kg (138 lb 0.1 oz) 61.2 kg (134 lb 14.7 oz)    Examination:  General:  A/O 4, NAD, No acute respiratory distress Eyes: negative scleral hemorrhage, negative anisocoria, negative icterus ENT: Negative Runny nose, negative gingival bleeding, Neck:  Negative scars, masses, torticollis, lymphadenopathy, JVD Lungs: Clear to auscultation bilaterally without wheezes or crackles Cardiovascular: Regular rate and rhythm without murmur gallop or rub normal S1 and S2 Abdomen: negative abdominal pain, nondistended, positive soft, bowel sounds, no rebound, no ascites, no appreciable mass Extremities: No significant cyanosis. Right BKA stable, LLE +1 pedal  edema Skin: Negative rashes, lesions, ulcers Psychiatric:  Negative depression, negative anxiety, negative fatigue, negative mania  Central nervous system:  Cranial nerves II through XII intact, tongue/uvula midline, all extremities muscle strength 5/5, sensation intact throughout, finger nose finger bilateral within normal limits, quick finger touch bilateral within normal limits, negative dysarthria, negative expressive aphasia, negative receptive aphasia.  .     Data Reviewed: Care during the described time interval was provided by me .  I have reviewed this patient's available data, including medical history, events of note, physical examination, and all test results as part of my evaluation. I have personally reviewed and interpreted all radiology studies.  CBC:  Recent Labs Lab 03/03/16 2002  03/04/16 0441  03/04/16 2005 03/05/16 0407 03/05/16 0946 03/06/16 1252 03/06/16 1535  WBC 6.1  --  5.2  --   --   --   --  5.3 4.2  NEUTROABS 3.9  --   --   --   --   --   --  3.6 2.9  HGB 9.4*  < > 6.9*  < > 9.0* 8.3* 7.1* 6.5* 5.8*  HCT 30.9*  < > 22.8*  < > 28.0* 26.0* 22.1* 20.0* 18.0*  MCV 92.0  --  91.2  --   --   --   --  84.4 84.9  PLT 191  --  165  --   --   --   --  128* 115*  < > = values in this interval not displayed. Basic Metabolic Panel:  Recent Labs Lab 03/03/16 2002 03/03/16 2030 03/04/16 0441 03/05/16 0407 03/06/16 1851  NA 138 139 138 134* 131*  K 4.3 4.2 4.5 4.3 4.3  CL 97* 97* 101 95* 101  CO2 28  --  26 29 19*  GLUCOSE 138* 134* 141* 95 145*  BUN 79* 73* 83* 42* 57*  CREATININE 9.16* 9.10* 9.31* 6.10* 8.40*  CALCIUM 8.8*  --  8.3* 8.7* 7.8*  PHOS  --   --   --  5.5* 8.0*   GFR: Estimated Creatinine Clearance: 4.8 mL/min (by C-G formula based on Cr of 8.4). Liver Function Tests:  Recent Labs Lab 03/04/16 0441 03/05/16 0407 03/06/16 1851  AST 12*  --   --   ALT 8*  --   --   ALKPHOS 53  --   --   BILITOT 0.5  --   --   PROT 5.1*  --   --    ALBUMIN 2.4* 2.5* 2.0*   No results for input(s): LIPASE, AMYLASE in the last 168 hours. No results for input(s): AMMONIA in the last 168 hours. Coagulation Profile: No results for input(s): INR, PROTIME in the last 168 hours. Cardiac Enzymes:  Recent Labs Lab 03/03/16 2002 03/04/16 0441 03/04/16 1100  TROPONINI 0.09* 0.10* 0.11*   BNP (last 3 results) No results for input(s): PROBNP in the last 8760 hours. HbA1C: No results for input(s): HGBA1C in the last 72 hours. CBG: No results for input(s): GLUCAP in the last 168  hours. Lipid Profile: No results for input(s): CHOL, HDL, LDLCALC, TRIG, CHOLHDL, LDLDIRECT in the last 72 hours. Thyroid Function Tests: No results for input(s): TSH, T4TOTAL, FREET4, T3FREE, THYROIDAB in the last 72 hours. Anemia Panel: No results for input(s): VITAMINB12, FOLATE, FERRITIN, TIBC, IRON, RETICCTPCT in the last 72 hours. Urine analysis:    Component Value Date/Time   COLORURINE YELLOW 06/22/2014 [place] 06/22/2014 0531   LABSPEC 1.015 06/22/2014 0531   PHURINE 6.0 06/22/2014 0531   GLUCOSEU 100* 06/22/2014 0531   HGBUR SMALL* 06/22/2014 0531   HGBUR trace-lysed 06/03/2008 0855   BILIRUBINUR NEGATIVE 06/22/2014 0531   KETONESUR TRACE* 06/22/2014 0531   PROTEINUR 100* 06/22/2014 0531   UROBILINOGEN 0.2 06/22/2014 0531   NITRITE NEGATIVE 06/22/2014 0531   LEUKOCYTESUR NEGATIVE 06/22/2014 0531   Sepsis Labs: @LABRCNTIP (procalcitonin:4,lacticidven:4)  ) Recent Results (from the past 240 hour(s))  MRSA PCR Screening     Status: None   Collection Time: 03/03/16 10:09 PM  Result Value Ref Range Status   MRSA by PCR NEGATIVE NEGATIVE Final    Comment:        The GeneXpert MRSA Assay (FDA approved for NASAL specimens only), is one component of a comprehensive MRSA colonization surveillance program. It is not intended to diagnose MRSA infection nor to guide or monitor treatment for MRSA infections.           Radiology Studies: Nm Gi Blood Loss  03/06/2016  CLINICAL DATA:  Bleeding per rectum x4 days. EXAM: NUCLEAR MEDICINE GASTROINTESTINAL BLEEDING SCAN TECHNIQUE: Sequential abdominal images were obtained following intravenous administration of Tc-67m labeled red blood cells. RADIOPHARMACEUTICALS:  25.0 mCi Tc-49m in-vitro labeled red cells. COMPARISON:  Abdominal series 215 2017. CT 12/31/2014. Nuclear medicine scan 09/28/2014 and 09/24/2014. FINDINGS: Standard GI bleeding series obtained. Activity noted in the upper abdomen bilaterally and in the lower pelvis bilaterally most likely secondary to genitourinary normal activity. Perineal activity again noted. No clearcut GI bleed noted. IMPRESSION: Negative exam. Electronically Signed   By: Marcello Moores  Register   On: 03/06/2016 14:22        Scheduled Meds: . sodium chloride   Intravenous Once  . sodium chloride   Intravenous Once  . amLODipine  5 mg Oral Daily  . atorvastatin  80 mg Oral q1800  . brinzolamide  1 drop Both Eyes BID   And  . brimonidine  1 drop Both Eyes BID  . calcitRIOL      . calcitRIOL  1.5 mcg Oral Q M,W,F-HD  . carvedilol  12.5 mg Oral BID WC  . feeding supplement  1 Container Oral TID WC & HS  . feeding supplement (PRO-STAT SUGAR FREE 64)  30 mL Oral BID  . insulin aspart  0-9 Units Subcutaneous Q4H  . isosorbide mononitrate  15 mg Oral QHS  . latanoprost  1 drop Both Eyes QHS  . multivitamin  1 tablet Oral Daily  . nitroGLYCERIN      . pantoprazole  40 mg Oral QAC breakfast  . sevelamer carbonate  800 mg Oral With snacks  . sodium chloride flush  3 mL Intravenous Q12H   Continuous Infusions:    LOS: 3 days    Time spent: 40 minutes    WOODS, Randy Docker, Randy [name] [facility] Pager [phone]   If 7PM-7AM, please contact night-coverage [email or web address] Password [facility] 03/06/2016, 8:32 PM

## 2016-03-06 NOTE — Progress Notes (Signed)
Primary cardiologist: Dr. Carlyle Dolly  Seen for followup: CAD, abnormal troponin I  Subjective:    82 y.o. male, with ESRD on HD (M, W, F) , Hypertension, Anemia, apparently c/o bleeding per rectum   Was found to have minimal troponin elevations - we were asked to comment .   He has angina several times a month - takes SL NTG for relief.   No chest pain this morning. Feels dizzy however with recent hypotension. Reports having recurrent stools with GI bleeding (Gastroenterology service aware).  Objective:   Temp:  [96.8 F (36 C)-98.5 F (36.9 C)] 98.5 F (36.9 C) (06/14 1248) Pulse Rate:  [43-107] 81 (06/14 1248) Resp:  [10-25] 16 (06/14 1248) BP: (70-186)/(39-101) 115/64 mmHg (06/14 1248) SpO2:  [97 %-100 %] 100 % (06/14 1248) Weight:  [134 lb 14.4 oz (61.19 kg)-297 lb 6.4 oz (134.9 kg)] 134 lb 14.4 oz (61.19 kg) (06/14 0238) Last BM Date: 03/06/16  Filed Weights   03/05/16 0500 03/05/16 2134 03/06/16 0238  Weight: 135 lb 5.8 oz (61.4 kg) 297 lb 6.4 oz (134.9 kg) 134 lb 14.4 oz (61.19 kg)    Intake/Output Summary (Last 24 hours) at 03/06/16 1256 Last data filed at 03/06/16 0830  Gross per 24 hour  Intake   1400 ml  Output      0 ml  Net   1400 ml    Telemetry: Sinus rhythm with blocked PACs.  Exam:  General: Chronically ill-appearing male, no acute distress.  Lungs: Decreased breath sounds without crackles.  Cardiac: RRR with ectopy and 3/6 systolic murmur.  Abdomen: No rebound, bowel sounds present.  Extremities: Status post right BKA and left AKA. Left arm AV fistula.  Lab Results:  Basic Metabolic Panel:  Recent Labs Lab 03/03/16 2002 03/03/16 2030 03/04/16 0441 03/05/16 0407  NA 138 139 138 134*  K 4.3 4.2 4.5 4.3  CL 97* 97* 101 95*  CO2 28  --  26 29  GLUCOSE 138* 134* 141* 95  BUN 79* 73* 83* 42*  CREATININE 9.16* 9.10* 9.31* 6.10*  CALCIUM 8.8*  --  8.3* 8.7*    Liver Function Tests:  Recent Labs Lab 03/04/16 0441  03/05/16 0407  AST 12*  --   ALT 8*  --   ALKPHOS 53  --   BILITOT 0.5  --   PROT 5.1*  --   ALBUMIN 2.4* 2.5*    CBC:  Recent Labs Lab 03/03/16 2002  03/04/16 0441  03/04/16 2005 03/05/16 0407 03/05/16 0946  WBC 6.1  --  5.2  --   --   --   --   HGB 9.4*  < > 6.9*  < > 9.0* 8.3* 7.1*  HCT 30.9*  < > 22.8*  < > 28.0* 26.0* 22.1*  MCV 92.0  --  91.2  --   --   --   --   PLT 191  --  165  --   --   --   --   < > = values in this interval not displayed.  Cardiac Enzymes:  Recent Labs Lab 03/03/16 2002 03/04/16 0441 03/04/16 1100  TROPONINI 0.09* 0.10* 0.11*   Echocardiogram 08/04/2015: Study Conclusions  - Left ventricle: The cavity size was normal. Wall thickness was  increased increased in a pattern of mild to moderate LVH.  Incidentally noted transverse false tendon in LV. Systolic  function was moderately to severely reduced. The estimated  ejection fraction was in the range of 30%  to 35%. Diffuse  hypokinesis. There is severe hypokinesis of the  basal-midinferolateral and inferior myocardium. Doppler  parameters are consistent with restrictive physiology, indicative  of decreased left ventricular diastolic compliance and/or  increased left atrial pressure. - Aortic valve: Moderately calcified annulus. Trileaflet; mildly  calcified leaflets. Cusp separation was reduced. There was  moderate stenosis. There was trivial regurgitation. Mean gradient  (S): 16 mm Hg. VTI ratio of LVOT to aortic valve: 0.4. Valve area  (VTI): 1.24 cm^2. Valve area (Vmax): 1.21 cm^2. - Mitral valve: Calcified annulus. There was trivial regurgitation. - Left atrium: The atrium was moderately dilated. - Right atrium: The atrium was moderately to severely dilated.  Central venous pressure (est): 3 mm Hg. - Atrial septum: No defect or patent foramen ovale was identified. - Tricuspid valve: There was mild regurgitation. - Pulmonary arteries: PA peak pressure: 38 mm Hg  (S). - Pericardium, extracardiac: There was no pericardial effusion.  Impressions:  - Mild to moderate LVH with LVEF approximately 30%, diffuse  hypokinesis, most severe in the mid to basal inferior and  inferolateral wall. Diastolic filling pattern consistent with  restrictive physiology. Moderate left atrial enlargement.  Moderate calcific aortic stenosis with trivial aortic  regurgitation. Mild tricuspid regurgitation with PASP 38 mmHg.  Moderate to severe right atrial enlargement. Compared to the  previous study from December 2015, there has been further  reduction in LVEF, and progression in degree of aortic stenosis.  Cardiac catheterization 08/17/2014: Coronary dominance: right  Left mainstem: Short, no significant disease.   Left anterior descending (LAD): There was a moderate D1 that branched early with 40% ostial stenosis. This was followed by a large septal perforator. There was 40-50% proximal LAD stenosis at the takeoff of the septal perforator. 30% mid LAD stenosis at D2.   Left circumflex (LCx): There was a large ramus that branched early into a superior and and inferior division, both vessels were moderate in size. Both divisions of the ramus had relatively up to 80% proximal stenosis. The AV LCx itself had luminal irregularities.   Right coronary artery (RCA): There was an early large acute marginal with 40% proximal and 40% mid vessel stenosis. The RCA had diffuse luminal irregularities and up to 40% mid vessel stenosis.   Medications:   Scheduled Medications: . sodium chloride   Intravenous Once  . sodium chloride   Intravenous Once  . amLODipine  5 mg Oral Daily  . brinzolamide  1 drop Both Eyes BID   And  . brimonidine  1 drop Both Eyes BID  . calcitRIOL  1.5 mcg Oral Q M,W,F-HD  . carvedilol  12.5 mg Oral BID WC  . feeding supplement  1 Container Oral TID WC & HS  . feeding supplement (PRO-STAT SUGAR FREE 64)  30 mL Oral BID  . isosorbide  mononitrate  15 mg Oral QHS  . latanoprost  1 drop Both Eyes QHS  . multivitamin  1 tablet Oral Daily  . pantoprazole  40 mg Oral QAC breakfast  . sevelamer carbonate  800 mg Oral With snacks  . sodium chloride flush  3 mL Intravenous Q12H    PRN Medications: sodium chloride, sodium chloride, acetaminophen **OR** acetaminophen, gabapentin, lidocaine (PF), lidocaine-prilocaine, oxyCODONE, pentafluoroprop-tetrafluoroeth, polyvinyl alcohol   Assessment:   1. Mildly abnormal troponin I consistent with demand ischemia in light of comorbid illnesses and physiologic stressors. Has knows moderate CAD . Takes NTG regularly. Will make sure that he has NTG to take as needed,.  2. Moderate multivessel CAD by cardiac catheterization in 2015 that has been managed medically. He has regular recurring angina. Outpatient medical regimen includes aspirin, Coreg, lisinopril, Norvasc, and Imdur. Continue medical therapy   3. Hyperlipidemia, on Lipitor.  4. Ischemic cardiomyopathy with LVEF approximately 30%.  5. Moderate calcific aortic stenosis.  6. ESRD on hemodialysis.  7. Recurrent GI bleed   Gastroenterology is managing, colonoscopy deferred so far with plan for possible tagged RBC scan. He is getting repeat PRBC transfusion and fluid bolus.    Mertie Moores, MD  03/06/2016 1:03 PM    [facility] [place],  [address] [place], [place]  [address] Pager [phone] Phone: [phone]; Fax: [phone]

## 2016-03-06 NOTE — Op Note (Signed)
[facility] Patient Name: Randy [name] Procedure Date: 03/05/2016 4:45 PM MRN: [id number] Date of Birth: March 13, 1932 Attending MD: Barney Drain , MD CSN: [id number] Age: 82 Admit Type: Inpatient Procedure:                Upper GI endoscopy, DIAGNOSTIC Indications:              Gastrointestinal bleeding of unknown                            origin-HEMATOCHEZIA AND HYPOTENSIONM Hb 7.1 AFTER                            2u pRBCs Providers:                Barney Drain, MD, Rosina Lowenstein, RN, Georgeann Oppenheim,                            Technician Referring MD:             Ralene Bathe. Dondiego, MD Medicines:                TCS+ Fentanyl 25 micrograms IV, Midazolam 2 mg IV Complications:            No immediate complications. Estimated Blood Loss:     Estimated blood loss: none. Procedure:                Pre-Anesthesia Assessment:                           - Prior to the procedure, a History and Physical                            was performed, and patient medications and                            allergies were reviewed. [name] tolerance of                            previous anesthesia was also reviewed. The risks                            and benefits of the procedure and the sedation                            options and risks were discussed with the patient.                            All questions were answered, and informed consent                            was obtained. Prior Anticoagulants: The patient has                            taken aspirin, last dose was 2 days prior to  procedure. ASA Grade Assessment: III - A patient                            with severe systemic disease. After reviewing the                            risks and benefits, the patient was deemed in                            satisfactory condition to undergo the procedure.                           After obtaining informed consent, the endoscope was       passed under direct vision. Throughout the                            procedure, the patient's blood pressure, pulse, and                            oxygen saturations were monitored continuously. The                            [id number] scope was introduced through the                            mouth, and advanced to the second part of duodenum.                            The upper GI endoscopy was accomplished without                            difficulty. The patient tolerated the procedure                            well. Scope In: 4:51:16 PM Scope Out: 4:54:52 PM Total Procedure Duration: 0 hours 3 minutes 36 seconds  Findings:      A medium-sized hiatal hernia was present.      Segmental mild inflammation characterized by congestion (edema) and       erythema was found in the gastric body and in the gastric antrum.      A widely patent Schatzki ring (acquired) was found in the distal       esophagus.      The examined duodenum was normal. Impression:               - WIDELY PATENT SHCATZKI'S RING                           - Medium-sized hiatal hernia.                           - MILD Gastritis.                           - NO OLD OR FRESH BLOOD IN LUMEN OF STOMACH OR  DUODENUM                           -HEMATOCHEZIA/HYPOTENSION MOST LIKELY DUE TO                            DIVERTICULOSIS AND LESS LIKELY DUE TO SMALL BOWEL                            SOURCE Moderate Sedation:      Moderate (conscious) sedation was administered by the endoscopy nurse       and supervised by the endoscopist. The following parameters were       monitored: oxygen saturation, heart rate, blood pressure, and response       to care. Total physician intraservice time was 45 minutes. Recommendation:           - Return patient to hospital ward for ongoing care.                           - Clear liquid diet.                           - Continue present medications.                            - Use Protonix (pantoprazole) 40 mg PO daily.                           TRANSFER TO [facility]. IF BLEEDING CONTINUES PT MAY NEED                            TAGGED RBC SCAN, GIVENS, AND/OR ANGIOGRAPHY AS WELL                            AS SURGERY CONSULT. Procedure Code(s):        --- Professional ---                           [phone], Esophagogastroduodenoscopy, flexible,                            transoral; diagnostic, including collection of                            specimen(s) by brushing or washing, when performed                            (separate procedure)                           99153, Moderate sedation services; each additional                            15 minutes intraservice time                           99153, Moderate sedation services;  each additional                            15 minutes intraservice time                           G0500, Moderate sedation services provided by the                            same physician or other qualified health care                            professional performing a gastrointestinal                            endoscopic service that sedation supports,                            requiring the presence of an independent trained                            observer to assist in the monitoring of the                            patient's level of consciousness and physiological                            status; initial 15 minutes of intra-service time;                            patient age 12 years or older (additional time may                            be reported with [phone], as appropriate) Diagnosis Code(s):        --- Professional ---                           K44.9, Diaphragmatic hernia without obstruction or                            gangrene                           K29.70, Gastritis, unspecified, without bleeding                           K26.9, Duodenal ulcer, unspecified as acute or                             chronic, without hemorrhage or perforation                           K92.2, Gastrointestinal hemorrhage, unspecified CPT copyright 2016 [employer]. All rights reserved. The codes documented in this report are preliminary and upon coder review may  be revised to meet current compliance requirements. Barney Drain, MD  Barney Drain, MD 03/06/2016 2:03:00 PM This report has been signed electronically. Number of Addenda: 0

## 2016-03-06 NOTE — Progress Notes (Signed)
Pt continues to have recurring "burning" chest pain.  10/10, he was awoken from sleep with this pain.  A 3rd dose of nitro was given at 0230 and MD notified of this recurring issue.  Pt states that when he has pain like this it doesn't usually keep coming back like it is now.  Pt is requesting those "blood pressure pills".  I told him I cannot just give him any blood pressure medication at any time of the day when his blood pressure is normal. He said he wants that "pink pill".  RN will continue to monitor.

## 2016-03-06 NOTE — Plan of Care (Signed)
Problem: Pain Managment: Goal: General experience of comfort will improve Outcome: Completed/Met Date Met:  03/06/16 Denies of pain

## 2016-03-06 NOTE — Progress Notes (Signed)
A second dose of sublingual nitro was given at 2239 due to 10/10 "burning chest pain".  Per pt this happens every 2-3 days and he takes nitro at home.  Pain subsided within 2 minutes.  RN will continue to monitor.

## 2016-03-06 NOTE — Consult Note (Signed)
Reason for Consult: GI bleeding Referring Physician: Hospital team  Randy [name] is an 82 y.o. male.  HPI: Patient followed in [place] but seen by our group a year and half ago which was the only other time he had GI bleeding thought secondary to diverticuli in this time he's had bright red blood per rectum since Sunday and supposedly an endoscopy was okay and colonoscopy showed some blood in the terminal ileum and the right colon any skin continued to have some bright red bleeding and was transferred here and case nuclear medicine or interventional radiology was needed and his hospital computer chart was reviewed and he does not have any pain and does take an aspirin a day and occasionally an extra 1 but no other blood thinners or nonsteroidals and does have dialysis Monday Wednesday Friday and his family history is negative for any GI problems and he has no other complaints  Past Medical History  Diagnosis Date  . Hypercholesterolemia   . Gout   . Nonischemic cardiomyopathy (HCC)     LVEF 35-40%  . Anemia of chronic disease   . Essential hypertension   . Arthritis   . Peripheral vascular disease ([place])     Status post right below knee amputation for a nonhealing wound of the right foot 12/2014  . History of pneumonia 2014  . CAD (coronary artery disease)     Moderate multivessel disease 07/2015 - managed medically  . History of myopericarditis 2015    07/2015  . History of stroke   . ESRD on hemodialysis [facility])     M/W/F in [place] - Dr. Florene Glen  . History of blood transfusion   . Pneumonia     Past Surgical History  Procedure Laterality Date  . Knee arthroscopy Right 2007  . Back surgery    . Hemorrhoid surgery  1970's  . Ganglion cyst excision  01/03/2012    Procedure: REMOVAL GANGLION OF WRIST;  Surgeon: Scherry Ran, MD;  Location: [facility];  Service: General;  Laterality: Right;  . Colonoscopy      remote past by Dr. Tamala Julian in [place], [place]   . Av fistula placement   08/24/2012    Procedure: ARTERIOVENOUS (AV) FISTULA CREATION;  Surgeon: Rosetta Posner, MD;  Location: [place];  Service: Vascular;  Laterality: Left;  . Insertion of dialysis catheter Right      neck  . Insertion of dialysis catheter  10/19/2012    Procedure: INSERTION OF DIALYSIS CATHETER;  Surgeon: Rosetta Posner, MD;  Location: [place];  Service: Vascular;  Laterality: N/A;  REMOVE TEMPORARY CATH  . Cholecystectomy    . Lumbar disc surgery  2004  . Left heart catheterization with coronary angiogram N/A 08/17/2014    Procedure: LEFT HEART CATHETERIZATION WITH CORONARY ANGIOGRAM;  Surgeon: Larey Dresser, MD;  Location: [facility] CATH LAB;  Service: Cardiovascular;  Laterality: N/A;  . Colonoscopy Left 09/26/2014    Dr. Paulita Fujita: old and semi-fresh blood scattered throughout the colon, several medium-sized diverticula, no obvious focal bleeding site, no blood in TI, several polyps seen in cecum, ascending colon and proximal transverse s/p biopsy (tubular adenomas)  . Amputation Right 01/05/2015    BKA  . Capd removal N/A 03/13/2015    Procedure: CONTINUOUS AMBULATORY PERITONEAL DIALYSIS  (CAPD) CATHETER REMOVAL;  Surgeon: Coralie Keens, MD;  Location: [place];  Service: General;  Laterality: N/A;  . Cardiac catheterization    . Above knee leg amputation Left 10/10/2015  . Amputation Left  10/10/2015    Procedure: AMPUTATION ABOVE KNEE LEFT;  Surgeon: Angelia Mould, MD;  Location[facility];  Service: Vascular;  Laterality: Left;    Family History  Problem Relation Age of Onset  . Arthritis    . Cancer    . Kidney disease    . Anesthesia problems Neg Hx   . Hypotension Neg Hx   . Malignant hyperthermia Neg Hx   . Pseudochol deficiency Neg Hx   . Cancer Sister   . Colon cancer Brother     unclear   . Colon cancer Brother   . Stroke Mother     Social History:  reports that he quit smoking about 17 years ago. His smoking use included Cigarettes. He has a 30 pack-year smoking history. He quit  smokeless tobacco use about 22 years ago. His smokeless tobacco use included Chew. He reports that he does not drink alcohol or use illicit drugs.  Allergies: No Known Allergies  Medications: I have reviewed the patient's current medications.  Results for orders placed or performed during the hospital encounter of 03/03/16 (from the past 48 hour(s))  Troponin I (q 6hr x 3)     Status: Abnormal   Collection Time: 03/04/16 11:00 AM  Result Value Ref Range   Troponin I 0.11 (H) <0.031 ng/mL    Comment:        PERSISTENTLY INCREASED TROPONIN VALUES IN THE RANGE OF 0.04-0.49 ng/mL CAN BE SEEN IN:       -UNSTABLE ANGINA       -CONGESTIVE HEART FAILURE       -MYOCARDITIS       -CHEST TRAUMA       -ARRYHTHMIAS       -LATE PRESENTING MYOCARDIAL INFARCTION       -COPD   CLINICAL FOLLOW-UP RECOMMENDED.   Hepatitis B surface antigen     Status: None   Collection Time: 03/04/16 11:00 AM  Result Value Ref Range   Hepatitis B Surface Ag Negative Negative    Comment: (NOTE) Performed At: [facility] [place], [place] [id number] Lindon Romp MD [id number]   Prepare RBC     Status: None   Collection Time: 03/04/16  1:18 PM  Result Value Ref Range   Order Confirmation ORDER PROCESSED BY BLOOD BANK   Hemoglobin and hematocrit, blood     Status: Abnormal   Collection Time: 03/04/16  2:10 PM  Result Value Ref Range   Hemoglobin 9.6 (L) 13.0 - 17.0 g/dL    Comment: DELTA CHECK NOTED POST TRANSFUSION SPECIMEN    HCT 30.3 (L) 39.0 - 52.0 %  Hemoglobin and hematocrit, blood     Status: Abnormal   Collection Time: 03/04/16  8:05 PM  Result Value Ref Range   Hemoglobin 9.0 (L) 13.0 - 17.0 g/dL   HCT 28.0 (L) 39.0 - 52.0 %  Hemoglobin and hematocrit, blood     Status: Abnormal   Collection Time: 03/05/16  4:07 AM  Result Value Ref Range   Hemoglobin 8.3 (L) 13.0 - 17.0 g/dL   HCT 26.0 (L) 39.0 - 52.0 %  Renal function panel     Status: Abnormal   Collection  Time: 03/05/16  4:07 AM  Result Value Ref Range   Sodium 134 (L) 135 - 145 mmol/L   Potassium 4.3 3.5 - 5.1 mmol/L   Chloride 95 (L) 101 - 111 mmol/L   CO2 29 22 - 32 mmol/L   Glucose, Bld 95 65 -  99 mg/dL   BUN 42 (H) 6 - 20 mg/dL   Creatinine, Ser 6.10 (H) 0.61 - 1.24 mg/dL    Comment: DELTA CHECK NOTED   Calcium 8.7 (L) 8.9 - 10.3 mg/dL   Phosphorus 5.5 (H) 2.5 - 4.6 mg/dL   Albumin 2.5 (L) 3.5 - 5.0 g/dL   GFR calc non Af Amer 8 (L) >60 mL/min   GFR calc Af Amer 9 (L) >60 mL/min    Comment: (NOTE) The eGFR has been calculated using the CKD EPI equation. This calculation has not been validated in all clinical situations. eGFR's persistently <60 mL/min signify possible Chronic Kidney Disease.    Anion gap 10 5 - 15  Hemoglobin and hematocrit, blood     Status: Abnormal   Collection Time: 03/05/16  9:46 AM  Result Value Ref Range   Hemoglobin 7.1 (L) 13.0 - 17.0 g/dL   HCT 22.1 (L) 39.0 - 52.0 %    No results found.  ROS negative except above Blood pressure 167/73, pulse 93, temperature 97.7 F (36.5 C), temperature source Oral, resp. rate 13, height 4' 9"  (1.448 m), weight 61.19 kg (134 lb 14.4 oz), SpO2 98 %. Physical Exam Vital signs stable afebrile no acute distress lungs are clear heart regular rate and rhythm abdomen is soft nontender labs reviewed no labs from today yet probably plan to draw at dialysis Assessment/Plan: GI bleeding probably lower in patient with multiple medical problems Plan: We'll proceed with a stat nuclear bleeding scan and if positive probable angiogram by interventional radiology and if negative and bleeding continues probably consider capsule endoscopy next and continue clear liquids for now and call me if any question or problem and will follow with you Ardis Lawley E 03/06/2016, 9:08 AM

## 2016-03-06 NOTE — Op Note (Signed)
[facility] Patient Name: Randy [name] Procedure Date: 03/05/2016 4:18 PM MRN: [id number] Date of Birth: 23-Jun-1932 Attending MD: Barney Drain , MD CSN: [id number] Age: 82 Admit Type: Inpatient Procedure:                Colonoscopy, DIAGNOSTIC Indications:              Hematochezia-LAST TCS JAN 2016 FOR                            HEMATOCHEZIA(WO)-DIVERTICULAR BLEED Providers:                Barney Drain, MD, Rosina Lowenstein, RN, Georgeann Oppenheim,                            Technician Referring MD:             Ralene Bathe. Dondiego, MD Medicines:                Fentanyl 25 micrograms IV, Midazolam 2 mg IV Complications:            No immediate complications. Estimated Blood Loss:     Estimated blood loss: none. Procedure:                Pre-Anesthesia Assessment:                           - Prior to the procedure, a History and Physical                            was performed, and patient medications and                            allergies were reviewed. [name] tolerance of                            previous anesthesia was also reviewed. The risks                            and benefits of the procedure and the sedation                            options and risks were discussed with the patient.                            All questions were answered, and informed consent                            was obtained. Prior Anticoagulants: The patient has                            taken aspirin, last dose was 3 days prior to                            procedure. ASA Grade Assessment: III - A patient  with severe systemic disease. After reviewing the                            risks and benefits, the patient was deemed in                            satisfactory condition to undergo the procedure.                           After obtaining informed consent, the colonoscope                            was passed under direct vision. Throughout the                             procedure, the patient's blood pressure, pulse, and                            oxygen saturations were monitored continuously. The                            [id number] scope was introduced through                            the anus and advanced to the 10 cm into the ileum.                            The colonoscopy was technically difficult and                            complex due to a tortuous colon. Successful                            completion of the procedure was aided by changing                            the patient to a supine position, applying                            abdominal pressure and receiving assistance from                            additional staff. The patient tolerated the                            procedure fairly well, but SEDATION LIMITED BY PTS                            SBP(70-95) DURING TCS. The quality of the bowel                            preparation was good. The terminal ileum, ileocecal  valve, appendiceal orifice, and rectum were                            photographed. Scope In: 4:17:04 PM Scope Out: 4:43:59 PM Scope Withdrawal Time: 0 hours 15 minutes 19 seconds  Total Procedure Duration: 0 hours 26 minutes 55 seconds  Findings:      The digital rectal exam findings include GROSS BLACK CLOTS AND MODERTAE       AMOUNT OF BRIGHT RED BLOOD FROM ANUS.      The ileum, 10 cm from the ileocecal valve contained red blood. NO ACTIVE       BLEEDING SEEN.      Red blood AND FRESH CLOTS ASPIRATED in the ascending colon. AFTER       IRRIGATION, NO ACTIVE BLEEDING SEEN IN CECUM OR ASCENDING COLON.      Red blood AND OLD CLOTS SEEN at the anus, in the rectum, in the sigmoid       colon and in the descending colon. NO ACTIVE BLEEDING SEEN AFTER       IRRIGATION.      Multiple small and large-mouthed diverticula were found in the sigmoid       colon, descending colon, ascending colon and cecum. There was no        evidence of diverticular bleeding.      The recto-sigmoid colon and descending colon were significantly       redundant. Advancing the scope required changing the patient to a supine       position and using manual pressure.      Eight sessile, non-bleeding polyps were found in the ascending colon and       cecum. Polypectomy was not attempted. Impression:               - RECTAL BLEEDING MOST LIKELY DUE TO R COLON                            DIVERTICULOSIS, LESS LIKELY SMALL BOWEL OR GASTRIC                            SOURCE                           - Severe diverticulosis in the sigmoid colon, in                            the descending colon, in the ascending colon and in                            the cecum. There was no evidence of ACTIVE                            diverticular bleeding.                           - Redundant LEFT colon.                           - Eight, non-bleeding polyps in the ascending colon  and in the cecum. Resection not attempted. Moderate Sedation:      Moderate (conscious) sedation was administered by the endoscopy nurse       and supervised by the endoscopist. The following parameters were       monitored: oxygen saturation, heart rate, blood pressure, and response       to care. Total physician intraservice time was 45 minutes. Recommendation:           - Clear liquid diet.                           - Continue present medications.                           - EGD TO ASSESS FOR HIGH VOLUME UPPER GI BLEED                           -DOUBT BENEFITS OF REMOVING POLYPS OUTWEIGH RISK IN                            PT WITH MULTIPLE CO-MORBIDITIES                           - No repeat colonoscopy due to age. Procedure Code(s):        --- Professional ---                           [phone], Colonoscopy, flexible; diagnostic, including                            collection of specimen(s) by brushing or washing,                             when performed (separate procedure)                           99153, Moderate sedation services; each additional                            15 minutes intraservice time                           99153, Moderate sedation services; each additional                            15 minutes intraservice time                           [id number], Moderate sedation services provided by the                            same physician or other qualified health care                            professional performing a gastrointestinal  endoscopic service that sedation supports,                            requiring the presence of an independent trained                            observer to assist in the monitoring of the                            patient's level of consciousness and physiological                            status; initial 15 minutes of intra-service time;                            patient age 55 years or older (additional time may                            be reported with [phone], as appropriate) Diagnosis Code(s):        --- Professional ---                           K57.31, Diverticulosis of large intestine without                            perforation or abscess with bleeding                           K62.5, Hemorrhage of anus and rectum                           K92.2, Gastrointestinal hemorrhage, unspecified                           D12.2, Benign neoplasm of ascending colon                           D12.0, Benign neoplasm of cecum                           K92.1, Melena (includes Hematochezia)                           Q43.8, Other specified congenital malformations of                            intestine CPT copyright 2016 [employer]. All rights reserved. The codes documented in this report are preliminary and upon coder review may  be revised to meet current compliance requirements. Barney Drain, MD Barney Drain, MD 03/06/2016 2:23:32  PM This report has been signed electronically. Number of Addenda: 0

## 2016-03-06 NOTE — Consult Note (Signed)
[facility] Renal Consultation Note    Indication for Consultation:  Management of ESRD/hemodialysis; anemia, hypertension/volume and secondary hyperparathyroidism PCP:  HPI: Randy [name] is a 82 y.o. male with ESRD on hemodialysis MWF at [facility]. PHM significant for hypertension, CVA, multivessel CAD-medically managed, CVA, NIC with Ef 35-40%, gout, hyperlipidemia, PVD, L AKA, R BKA. GIB 2/2 diverticuli.   Patient states that he started having bleeding from rectum 03/03/2016. No other associated symptoms other than "I just felt bad" and some abdominal pain, L & RLQ of abdomen. Went to [facility] for evaluation. Colonoscopy not done.  Had elevated troponins-cardiology consulted-thought to be demand ischemia.  Required transfusion of PRBCs for HGB 6.9-has received 4 units PRBCS since admission. He has been transferred to [facility] for further evaluation- NM bleeding scan. Patient continues to have rectal bleeding (present while pt being cleaned) bright red blood. HGB 7.1. PRBCs have been ordered, plan for nuclear bleeding scan/possible arteriogram per GI. Patient had hemodialysis at [facility] 03/04/16.    Past Medical History  Diagnosis Date  . Hypercholesterolemia   . Gout   . Nonischemic cardiomyopathy (HCC)     LVEF 35-40%  . Anemia of chronic disease   . Essential hypertension   . Arthritis   . Peripheral vascular disease ([place])     Status post right below knee amputation for a nonhealing wound of the right foot 12/2014  . History of pneumonia 2014  . CAD (coronary artery disease)     Moderate multivessel disease 07/2015 - managed medically  . History of myopericarditis 2015    07/2015  . History of stroke   . ESRD on hemodialysis [facility])     M/W/F in [place] - Dr. Florene Glen  . History of blood transfusion   . Pneumonia    Past Surgical History  Procedure Laterality Date  . Knee arthroscopy Right 2007  . Back surgery    . Hemorrhoid surgery  1970's  . Ganglion cyst  excision  01/03/2012    Procedure: REMOVAL GANGLION OF WRIST;  Surgeon: Scherry Ran, MD;  Location: [facility]S;  Service: General;  Laterality: Right;  . Colonoscopy      remote past by Dr. Tamala Julian in [place], [place]   . Av fistula placement  08/24/2012    Procedure: ARTERIOVENOUS (AV) FISTULA CREATION;  Surgeon: Rosetta Posner, MD;  Location: [place];  Service: Vascular;  Laterality: Left;  . Insertion of dialysis catheter Right      neck  . Insertion of dialysis catheter  10/19/2012    Procedure: INSERTION OF DIALYSIS CATHETER;  Surgeon: Rosetta Posner, MD;  Location: [place];  Service: Vascular;  Laterality: N/A;  REMOVE TEMPORARY CATH  . Cholecystectomy    . Lumbar disc surgery  2004  . Left heart catheterization with coronary angiogram N/A 08/17/2014    Procedure: LEFT HEART CATHETERIZATION WITH CORONARY ANGIOGRAM;  Surgeon: Larey Dresser, MD;  Location: [facility] CATH LAB;  Service: Cardiovascular;  Laterality: N/A;  . Colonoscopy Left 09/26/2014    Dr. Paulita Fujita: old and semi-fresh blood scattered throughout the colon, several medium-sized diverticula, no obvious focal bleeding site, no blood in TI, several polyps seen in cecum, ascending colon and proximal transverse s/p biopsy (tubular adenomas)  . Amputation Right 01/05/2015    BKA  . Capd removal N/A 03/13/2015    Procedure: CONTINUOUS AMBULATORY PERITONEAL DIALYSIS  (CAPD) CATHETER REMOVAL;  Surgeon: Coralie Keens, MD;  Location: [place];  Service: General;  Laterality: N/A;  .  Cardiac catheterization    . Above knee leg amputation Left 10/10/2015  . Amputation Left 10/10/2015    Procedure: AMPUTATION ABOVE KNEE LEFT;  Surgeon: Angelia Mould, MD;  Location: [facility] OR;  Service: Vascular;  Laterality: Left;   Family History  Problem Relation Age of Onset  . Arthritis    . Cancer    . Kidney disease    . Anesthesia problems Neg Hx   . Hypotension Neg Hx   . Malignant hyperthermia Neg Hx   . Pseudochol deficiency Neg Hx   . Cancer Sister   .  Colon cancer Brother     unclear   . Colon cancer Brother   . Stroke Mother    Social History:  reports that he quit smoking about 17 years ago. His smoking use included Cigarettes. He has a 30 pack-year smoking history. He quit smokeless tobacco use about 22 years ago. His smokeless tobacco use included Chew. He reports that he does not drink alcohol or use illicit drugs. No Known Allergies Prior to Admission medications   Medication Sig Start Date End Date Taking? Authorizing Provider  amLODipine (NORVASC) 10 MG tablet Take 0.5 tablets (5 mg total) by mouth daily. 10/31/15  Yes Ivan Anchors Love, PA-C  aspirin 81 MG tablet Take 81 mg by mouth daily.   Yes Historical Provider, MD  atorvastatin (LIPITOR) 80 MG tablet Take 1 tablet (80 mg total) by mouth daily. 11/02/15  Yes Arnoldo Lenis, MD  calcitRIOL (ROCALTROL) 0.5 MCG capsule Take 1 capsule (0.5 mcg total) by mouth daily. 11/10/15  Yes Lucia Gaskins, MD  carvedilol (COREG) 12.5 MG tablet Take 1 tablet (12.5 mg total) by mouth 2 (two) times daily with a meal. 12/20/15  Yes Ripudeep K Rai, MD  docusate sodium (COLACE) 100 MG capsule Take 2 capsules (200 mg total) by mouth daily. Patient taking differently: Take 200 mg by mouth daily as needed for mild constipation or moderate constipation.  10/31/15  Yes Ivan Anchors Love, PA-C  gabapentin (NEURONTIN) 100 MG capsule Take 1 capsule (100 mg total) by mouth at bedtime. 10/23/15  Yes Barton Dubois, MD  hydrALAZINE (APRESOLINE) 25 MG tablet Tke 3 tabs (75mg ) TID (3x /day) on non-dialysis days and 50mg  (2tabs) TID on HD days. Patient taking differently: Take 25 mg by mouth 3 (three) times daily. Does not take until arrives back home on dialysis days (MWF) 12/20/15  Yes Ripudeep Krystal Eaton, MD  isosorbide mononitrate (IMDUR) 30 MG 24 hr tablet Take 0.5 tablets (15 mg total) by mouth at bedtime. 09/20/15  Yes Arnoldo Lenis, MD  lidocaine-prilocaine (EMLA) cream Apply 1 application topically every Monday,  Wednesday, and Friday. For dialysis 10/06/15  Yes Historical Provider, MD  lisinopril (PRINIVIL,ZESTRIL) 40 MG tablet Take 1 tablet (40 mg total) by mouth at bedtime. 12/20/15  Yes Ripudeep K Rai, MD  LUMIGAN 0.01 % SOLN Place 1 drop into both eyes at bedtime. 09/18/15  Yes Historical Provider, MD  multivitamin (RENA-VIT) TABS tablet Take 1 tablet by mouth every Monday, Wednesday, and Friday. Takes on dialysis days   Yes Historical Provider, MD  nitroGLYCERIN (NITROSTAT) 0.4 MG SL tablet Place 1 tablet (0.4 mg total) under the tongue every 5 (five) minutes as needed for chest pain. 12/20/15  Yes Ripudeep Krystal Eaton, MD  oxyCODONE (OXY IR/ROXICODONE) 5 MG immediate release tablet Take 1 tablet (5 mg total) by mouth every 6 (six) hours as needed for moderate pain or severe pain. 10/31/15  Yes Ivan Anchors  Love, PA-C  pantoprazole (PROTONIX) 40 MG tablet Take 1 tablet (40 mg total) by mouth daily at 12 noon. 10/23/15  Yes Barton Dubois, MD  polyvinyl alcohol (LIQUIFILM TEARS) 1.4 % ophthalmic solution Place 1 drop into both eyes as needed for dry eyes. 10/31/15  Yes Ivan Anchors Love, PA-C  sevelamer carbonate (RENVELA) 800 MG tablet Take 1,600-2,400 mg by mouth See admin instructions. Take 3 tablets (2400 mg) with meals and 2 tablets (1600 mg) with snacks   Yes Historical Provider, MD  SIMBRINZA 1-0.2 % SUSP Place 1 drop into both eyes 2 (two) times daily. 09/18/15  Yes Historical Provider, MD   Current Facility-Administered Medications  Medication Dose Route Frequency Provider Last Rate Last Dose  . 0.9 %  sodium chloride infusion  100 mL Intravenous PRN Fran Lowes, MD      . 0.9 %  sodium chloride infusion  100 mL Intravenous PRN Fran Lowes, MD      . 0.9 %  sodium chloride infusion   Intravenous Once Danie Binder, MD      . 0.9 %  sodium chloride infusion   Intravenous Once Allie Bossier, MD      . acetaminophen (TYLENOL) tablet 650 mg  650 mg Oral Q6H PRN Jani Gravel, MD       Or  . acetaminophen  (TYLENOL) suppository 650 mg  650 mg Rectal Q6H PRN Jani Gravel, MD      . amLODipine (NORVASC) tablet 5 mg  5 mg Oral Daily Satira Sark, MD   5 mg at 03/04/16 1509  . brinzolamide (AZOPT) 1 % ophthalmic suspension 1 drop  1 drop Both Eyes BID Lucia Gaskins, MD   1 drop at 03/05/16 0900   And  . brimonidine (ALPHAGAN) 0.2 % ophthalmic solution 1 drop  1 drop Both Eyes BID Lucia Gaskins, MD   1 drop at 03/05/16 0900  . calcitRIOL (ROCALTROL) capsule 1.5 mcg  1.5 mcg Oral Q M,W,F-HD Valentina Gu, NP      . carvedilol (COREG) tablet 12.5 mg  12.5 mg Oral BID WC Satira Sark, MD   12.5 mg at 03/06/16 0738  . feeding supplement (BOOST / RESOURCE BREEZE) liquid 1 Container  1 Container Oral TID WC & HS Danie Binder, MD   1 Container at 03/06/16 0740  . gabapentin (NEURONTIN) capsule 100 mg  100 mg Oral QHS PRN Jani Gravel, MD   100 mg at 03/05/16 2146  . isosorbide mononitrate (IMDUR) 24 hr tablet 15 mg  15 mg Oral QHS Rhetta Mura Schorr, NP   15 mg at 03/05/16 2239  . latanoprost (XALATAN) 0.005 % ophthalmic solution 1 drop  1 drop Both Eyes QHS Jani Gravel, MD   1 drop at 03/04/16 2117  . lidocaine (PF) (XYLOCAINE) 1 % injection 5 mL  5 mL Intradermal PRN Fran Lowes, MD      . lidocaine-prilocaine (EMLA) cream 1 application  1 application Topical PRN Fran Lowes, MD      . multivitamin (RENA-VIT) tablet 1 tablet  1 tablet Oral Daily Jani Gravel, MD   1 tablet at 03/05/16 0900  . oxyCODONE (Oxy IR/ROXICODONE) immediate release tablet 5 mg  5 mg Oral Q6H PRN Jani Gravel, MD   5 mg at 03/05/16 2146  . pantoprazole (PROTONIX) EC tablet 40 mg  40 mg Oral QAC breakfast Danie Binder, MD   40 mg at 03/06/16 0738  . pentafluoroprop-tetrafluoroeth (GEBAUERS) aerosol 1 application  1 application  Topical PRN Fran Lowes, MD      . polyvinyl alcohol (LIQUIFILM TEARS) 1.4 % ophthalmic solution 1 drop  1 drop Both Eyes PRN Jani Gravel, MD      . sevelamer carbonate (RENVELA) tablet  800 mg  800 mg Oral With snacks Valentina Gu, NP      . sodium chloride flush (NS) 0.9 % injection 3 mL  3 mL Intravenous Q12H Jani Gravel, MD   3 mL at 03/05/16 2147   Labs: Basic Metabolic Panel:  Recent Labs Lab 03/03/16 2002 03/03/16 2030 03/04/16 0441 03/05/16 0407  NA 138 139 138 134*  K 4.3 4.2 4.5 4.3  CL 97* 97* 101 95*  CO2 28  --  26 29  GLUCOSE 138* 134* 141* 95  BUN 79* 73* 83* 42*  CREATININE 9.16* 9.10* 9.31* 6.10*  CALCIUM 8.8*  --  8.3* 8.7*  PHOS  --   --   --  5.5*   Liver Function Tests:  Recent Labs Lab 03/04/16 0441 03/05/16 0407  AST 12*  --   ALT 8*  --   ALKPHOS 53  --   BILITOT 0.5  --   PROT 5.1*  --   ALBUMIN 2.4* 2.5*   CBC:  Recent Labs Lab 03/03/16 2002  03/04/16 0441  03/04/16 2005 03/05/16 0407 03/05/16 0946  WBC 6.1  --  5.2  --   --   --   --   NEUTROABS 3.9  --   --   --   --   --   --   HGB 9.4*  < > 6.9*  < > 9.0* 8.3* 7.1*  HCT 30.9*  < > 22.8*  < > 28.0* 26.0* 22.1*  MCV 92.0  --  91.2  --   --   --   --   PLT 191  --  165  --   --   --   --   < > = values in this interval not displayed. Cardiac Enzymes:  Recent Labs Lab 03/03/16 2002 03/04/16 0441 03/04/16 1100  TROPONINI 0.09* 0.10* 0.11*   CBG: No results for input(s): GLUCAP in the last 168 hours. Iron Studies: No results for input(s): IRON, TIBC, TRANSFERRIN, FERRITIN in the last 72 hours. Studies/Results: No results found.  ROS: As per HPI otherwise negative.   Physical Exam: Filed Vitals:   03/06/16 0400 03/06/16 0500 03/06/16 0700 03/06/16 0738  BP: 129/61 142/68 156/73 167/73  Pulse: 87 83 87 93  Temp:      TempSrc:      Resp: 13 11 13    Height:      Weight:      SpO2: 98% 100% 98%      General: Chronically ill appearing male, pleasant cooperative, NAD.  Head: Normocephalic, atraumatic, sclera non-icteric, mucus membranes are moist Neck: Supple. JVD not elevated. Lungs: Clear bilaterally to auscultation without wheezes, rales, or  rhonchi. Breathing is unlabored. Heart: SR with PACS on monitor. S1, S2, III/VI systolic M.  Abdomen: Soft, non-tender, non-distended with normoactive bowel sounds. No rebound/guarding. No obvious abdominal masses. Having bloody stools, bright red blood.  M-S:  Strength and tone appear normal for age. Lower extremities: L BKA R AKA no LE edema.  Neuro: Alert and oriented X 3. Moves all extremities spontaneously. Psych:  Responds to questions appropriately with a normal affect. Dialysis Access: LFA AVF + bruit  Dialysis Orders:  [facility]-MWF 3.5 hours 400/800 EDW 56.5 2.0K/2.25 Ca linear Na  UF Profile 4 No Heparin Mircera 150 mcg IV q 2 weeks (last dose 02/21/16 100 mcg IV HGB 10.2 03/09/16) Calcitriol 1.75 mcg IV q MWF (Last PTH 651 02/07/16)   Assessment/Plan: 1.  LGIB: per primary/GI consulted. HGB 7.1. For nuclear bleeding scan/probable arteriogram if positive.  2.  ESRD -  MWF-[facility]. Will have HD today. No heparin.  3.  Hypertension/volume  - BP controlled. Amlodipine, carvedilol, hydralazine, lisinopril per OP med list. All have been resumed except lisinopril. Also on isosorbide dinitrate. Wt 61.9. Has been leaving HD slightly above EDW. Attempt 3.5 4 liters today as tolerated.  4.  Anemia  - HGB 7.1. 2 units of PRBCs pending. Give ESA today in HD.  5.  Metabolic bone disease -  Continue VDRA/Binders.  6.  Nutrition - Clear liquids at present. Albumin 2.5. Renal diet/prostat/renal vit when able to have solid food.    Rita H. Owens Shark, NP-C 03/06/2016, 9:08 AM  [employer] [phone]  Pt seen, examined and agree w A/P as above.  Kelly Splinter MD [facility] pager [phone]    cell [phone] 03/06/2016, 2:59 PM

## 2016-03-06 NOTE — Progress Notes (Signed)
CRITICAL VALUE ALERT  Critical value received:  Hemoglobin 6.5  Date of notification:  03/06/2016   Time of notification:  [id number]  Critical value read back: yes  Nurse who received alert:  Donnella Bi, RN  MD notified (1st page): Dr. Sherral Hammers  Time of first page: [phone]  MD notified (2nd page):  Time of second page:  Responding MD:  Dr. Sherral Hammers  Time MD responded: [phone]

## 2016-03-07 ENCOUNTER — Encounter (HOSPITAL_COMMUNITY): Payer: Self-pay | Admitting: Gastroenterology

## 2016-03-07 ENCOUNTER — Telehealth: Payer: Self-pay | Admitting: Cardiology

## 2016-03-07 ENCOUNTER — Encounter (HOSPITAL_COMMUNITY)
Admission: EM | Disposition: A | Discharge status: Home / Self Care | Payer: Self-pay | Source: Home / Self Care | Attending: Internal Medicine

## 2016-03-07 DIAGNOSIS — E781 Pure hyperglyceridemia: Secondary | ICD-10-CM

## 2016-03-07 DIAGNOSIS — E876 Hypokalemia: Secondary | ICD-10-CM | POA: Diagnosis present

## 2016-03-07 DIAGNOSIS — I2510 Atherosclerotic heart disease of native coronary artery without angina pectoris: Secondary | ICD-10-CM

## 2016-03-07 HISTORY — PX: GIVENS CAPSULE STUDY: SHX5432

## 2016-03-07 LAB — LIPID PANEL
Cholesterol: 60 mg/dL (ref 0–200)
HDL: 23 mg/dL — ABNORMAL LOW (ref >40)
LDL CALC: 22 mg/dL (ref 0–99)
TRIGLYCERIDES: 73 mg/dL (ref <150)
Total CHOL/HDL Ratio: 2.6 RATIO
VLDL: 15 mg/dL (ref 0–40)

## 2016-03-07 LAB — BASIC METABOLIC PANEL
ANION GAP: 5 (ref 5–15)
BUN: 16 mg/dL (ref 6–20)
CHLORIDE: 100 mmol/L — AB (ref 101–111)
CO2: 30 mmol/L (ref 22–32)
Calcium: 7.8 mg/dL — ABNORMAL LOW (ref 8.9–10.3)
Creatinine, Ser: 3.4 mg/dL — ABNORMAL HIGH (ref 0.61–1.24)
GFR calc Af Amer: 18 mL/min — ABNORMAL LOW (ref >60)
GFR, EST NON AFRICAN AMERICAN: 15 mL/min — AB (ref >60)
GLUCOSE: 136 mg/dL — AB (ref 65–99)
POTASSIUM: 3.4 mmol/L — AB (ref 3.5–5.1)
Sodium: 135 mmol/L (ref 135–145)

## 2016-03-07 LAB — TROPONIN I: TROPONIN I: 0.44 ng/mL — AB (ref <0.031)

## 2016-03-07 LAB — GLUCOSE, CAPILLARY
GLUCOSE-CAPILLARY: 103 mg/dL — AB (ref 65–99)
GLUCOSE-CAPILLARY: 111 mg/dL — AB (ref 65–99)
GLUCOSE-CAPILLARY: 114 mg/dL — AB (ref 65–99)
GLUCOSE-CAPILLARY: 191 mg/dL — AB (ref 65–99)
Glucose-Capillary: 99 mg/dL (ref 65–99)
Glucose-Capillary: 112 mg/dL — ABNORMAL HIGH (ref 65–99)
Glucose-Capillary: 112 mg/dL — ABNORMAL HIGH (ref 65–99)
Glucose-Capillary: 62 mg/dL — ABNORMAL LOW (ref 65–99)

## 2016-03-07 LAB — PREPARE RBC (CROSSMATCH)

## 2016-03-07 LAB — HEMOGLOBIN AND HEMATOCRIT, BLOOD
HEMATOCRIT: 19.8 % — AB (ref 39.0–52.0)
HEMATOCRIT: 24.2 % — AB (ref 39.0–52.0)
HEMATOCRIT: 25.4 % — AB (ref 39.0–52.0)
HEMOGLOBIN: 7.9 g/dL — AB (ref 13.0–17.0)
Hemoglobin: 6.4 g/dL — CL (ref 13.0–17.0)
Hemoglobin: 8.3 g/dL — ABNORMAL LOW (ref 13.0–17.0)

## 2016-03-07 LAB — MAGNESIUM: Magnesium: 1.7 mg/dL (ref 1.7–2.4)

## 2016-03-07 SURGERY — IMAGING PROCEDURE, GI TRACT, INTRALUMINAL, VIA CAPSULE
Anesthesia: LOCAL

## 2016-03-07 MED ORDER — MAGNESIUM SULFATE 2 GM/50ML IV SOLN
2.0000 g | Freq: Once | INTRAVENOUS | Status: AC
  Administered 2016-03-07: 2 g via INTRAVENOUS
  Filled 2016-03-07: qty 50

## 2016-03-07 MED ORDER — DARBEPOETIN ALFA 100 MCG/0.5ML IJ SOSY
100.0000 ug | PREFILLED_SYRINGE | INTRAMUSCULAR | Status: DC
Start: 2016-03-15 — End: 2016-03-14

## 2016-03-07 MED ORDER — DARBEPOETIN ALFA 100 MCG/0.5ML IJ SOSY
100.0000 ug | PREFILLED_SYRINGE | Freq: Once | INTRAMUSCULAR | Status: AC
  Administered 2016-03-07: 100 ug via INTRAVENOUS
  Filled 2016-03-07 (×2): qty 0.5

## 2016-03-07 MED ORDER — GLUCOSE 40 % PO GEL
ORAL | Status: AC
  Administered 2016-03-07: 37.5 g
  Filled 2016-03-07: qty 1

## 2016-03-07 MED ORDER — SODIUM CHLORIDE 0.9 % IV SOLN
Freq: Once | INTRAVENOUS | Status: DC
  Administered 2016-03-07: 13:00:00 via INTRAVENOUS

## 2016-03-07 MED ORDER — POTASSIUM CHLORIDE 10 MEQ/100ML IV SOLN
10.0000 meq | INTRAVENOUS | Status: AC
  Administered 2016-03-07 (×2): 10 meq via INTRAVENOUS
  Filled 2016-03-07 (×2): qty 100

## 2016-03-07 MED ORDER — SODIUM CHLORIDE 0.9 % IV SOLN
Freq: Once | INTRAVENOUS | Status: AC
  Administered 2016-03-07: 04:00:00 via INTRAVENOUS

## 2016-03-07 SURGICAL SUPPLY — 1 items: TOWEL COTTON PACK 4EA (MISCELLANEOUS) ×4 IMPLANT

## 2016-03-07 NOTE — Progress Notes (Signed)
Pt ingested capsule at 0930; stated understanding of instructions

## 2016-03-07 NOTE — Progress Notes (Signed)
Pt complains of chest pain. Nitro 0.4mg  tablet given sublingually. Will continue to monitor.

## 2016-03-07 NOTE — Progress Notes (Signed)
[facility] Progress Note   Subjective: no c/o, still bleeding, has rectal tube in  Filed Vitals:   03/07/16 0630 03/07/16 0721 03/07/16 0800 03/07/16 0931  BP: 144/58 127/60 150/74   Pulse: 85     Temp: 98.6 F (37 C)  98.5 F (36.9 C)   TempSrc: Oral  Oral   Resp: 12 11    Height:      Weight:    59.875 kg (132 lb)  SpO2: 97% 100%      Inpatient medications: . sodium chloride   Intravenous Once  . sodium chloride   Intravenous Once  . sodium chloride   Intravenous Once  . amLODipine  5 mg Oral Daily  . atorvastatin  80 mg Oral q1800  . brinzolamide  1 drop Both Eyes BID   And  . brimonidine  1 drop Both Eyes BID  . calcitRIOL  1.5 mcg Oral Q M,W,F-HD  . carvedilol  12.5 mg Oral BID WC  . feeding supplement  1 Container Oral TID WC & HS  . feeding supplement (PRO-STAT SUGAR FREE 64)  30 mL Oral BID  . insulin aspart  0-9 Units Subcutaneous Q4H  . isosorbide mononitrate  15 mg Oral QHS  . latanoprost  1 drop Both Eyes QHS  . multivitamin  1 tablet Oral Daily  . pantoprazole  40 mg Oral QAC breakfast  . sevelamer carbonate  800 mg Oral With snacks  . sodium chloride flush  3 mL Intravenous Q12H     sodium chloride, sodium chloride, sodium chloride, sodium chloride, acetaminophen **OR** acetaminophen, gabapentin, lidocaine (PF), lidocaine-prilocaine, nitroGLYCERIN, oxyCODONE, pentafluoroprop-tetrafluoroeth, polyvinyl alcohol  Exam: Alert, no distress, calm No jvd Chest clear bilat RRR 3/6 sem, no RG ABd soft ntnd Rectal tube draining bloody liquid  L BKA/ R AKA, no LE edema LFA AVF +Bruit Ox 3, nf  Dialysis: [facility] MWF  3.5h  56.5kg  2/2.25 bath  P4  Hep none  LFA AVF Mircera 150 mcg IV q 2 weeks (last dose 02/21/16 100 mcg IV HGB 10.2 03/09/16) Calcitriol 1.75 mcg IV q MWF (Last PTH 651 02/07/16)      Assessment: 1  Lower GI bleed - w/u in progress 2  Anemia Hb 7.8, due for esa , ordered darbe 100 ug / wk while here 3  HTN on some of his  home meds , low dose coreg/ norvasc , BP good 4  Vol up 2-3kg 5  MBD no chg 6  PAD bilat amputee 7  Hx CVA  Plan - HD tomorrow, no hep, UF to dry wt   Kelly Splinter MD [facility] pager [phone]    cell [phone] 03/07/2016, 11:49 AM    Recent Labs Lab 03/05/16 0407 03/06/16 1851 03/07/16 0159  NA 134* 131* 135  K 4.3 4.3 3.4*  CL 95* 101 100*  CO2 29 19* 30  GLUCOSE 95 145* 136*  BUN 42* 57* 16  CREATININE 6.10* 8.40* 3.40*  CALCIUM 8.7* 7.8* 7.8*  PHOS 5.5* 8.0*  --     Recent Labs Lab 03/04/16 0441 03/05/16 0407 03/06/16 1851  AST 12*  --   --   ALT 8*  --   --   ALKPHOS 53  --   --   BILITOT 0.5  --   --   PROT 5.1*  --   --   ALBUMIN 2.4* 2.5* 2.0*    Recent Labs Lab 03/03/16 2002  03/04/16 0441  03/06/16 1252 03/06/16 1535  03/06/16 2039 03/07/16 0159 03/07/16 0822  WBC 6.1  --  5.2  --  5.3 4.2  --   --   --   NEUTROABS 3.9  --   --   --  3.6 2.9  --   --   --   HGB 9.4*  < > 6.9*  < > 6.5* 5.8* 8.7* 6.4* 7.9*  HCT 30.9*  < > 22.8*  < > 20.0* 18.0* 27.0* 19.8* 24.2*  MCV 92.0  --  91.2  --  84.4 84.9  --   --   --   PLT 191  --  165  --  128* 115*  --   --   --   < > = values in this interval not displayed. Iron/TIBC/Ferritin/ %Sat    Component Value Date/Time   IRON 24* 10/25/2015 1628   TIBC 113* 10/25/2015 1628   FERRITIN 1242* 10/25/2015 1628   IRONPCTSAT 21 10/25/2015 1628

## 2016-03-07 NOTE — Telephone Encounter (Signed)
Would like for Dr Harl Bowie to know that Randy [name] is in the hospital

## 2016-03-07 NOTE — Progress Notes (Signed)
PROGRESS NOTE    Randy [name]  [id number] DOB: 10-Jul-1932 DOA: 03/03/2016 PCP: [name], MD   Brief Narrative:  82 y.o. BM PMHx CVA, ESRD on HD (M, W, F) , HTN,Nonischemic cardiomyopathy LVEF 35-40% , Myocarditis, PVD, CAD multiple native artery, HLD, Hypertension, Anemia, S/P Right BKA  Apparently c/o bleeding per rectum starting this afternoon. Pt states that there was blood on the toilet paper and the toilet bowel. +n/v x1, No blood. Denies fever, chills, abd pain, diarrhea, black stool.    Assessment & Plan:   Principal Problem:   GI bleeding Active Problems:   ESRD on dialysis [facility])   Essential hypertension   GI bleed   Hyperglycemia   Coronary artery disease involving native coronary artery of native heart with angina pectoris (HCC)   Hyperlipidemia   Cardiomyopathy, ischemic   Aortic stenosis   Gastrointestinal hemorrhage associated with anorectal source   CAD in native artery   Hypertriglyceridemia   Hypokalemia   Hypomagnesemia   Lower GI bleed -6/14 Transfuse 2 units PRBC - Patient being worked up by GI, they have ordered nuclear bleeding scan and if positive probable angiogram by interventional radiology. Will await recommendations -H/H QID -Patient continues to bleed overnight hemoglobin drop from 8.7 on 6/15 post transfusion to 6.4 AM 6/15.  -6/15 Transfuse 2 units PRBC  Recent Labs Lab 03/06/16 1535 03/06/16 2039 03/07/16 0159 03/07/16 0822 03/07/16 1841  HGB 5.8* 8.7* 6.4* 7.9* 8.3*     ESRD on HD M/W/F -Patient scheduled for HD today  Cardiomyopathy, ischemic -Strict in and outSince admission +1.6 L -Daily weight  Filed Weights   03/06/16 1934 03/07/16 0344 03/07/16 0931  Weight: 61.2 kg (134 lb 14.7 oz) 60.192 kg (132 lb 11.2 oz) 59.875 kg (132 lb)  -Transfuse for hemoglobin<8  Aortic stenosis -  HTN -Will allow permissive hypertension in order to facilitate HD and secondary to patient's ongoing GI bleed -Coreg  12.5 mg BID -Imdur 50 mg QHS  CAD native artery with angina/waxing and waning CP -Most likely secondary to patient's acute anemia--> demand ischemia -See hypertension -NTG PRN  -See cardiomyopathy -Serial Troponin pending -Echocardiogram pending  Hyperglycemia -Sensitive SSI  HLD/Hypertriglyceridemia -Lipitor 80 mg daily -Lipid panel; not within AHA guidelines -Will Add Niaspan, when patient able to take aspirin to prevent side effects   Right BKA -Stable  Hypokalemia -Potassium goal> 4 -Potassium IV 20 mg  Hypomagnesemia -Magnesium goal>2 -Magnesium IV 2 gm     DVT prophylaxis: SCD Code Status: Full Family Communication: Family at bedside answered all questions Disposition Plan: Per GI   Consultants:  Dr.Robert Doctor, hospital nephrology Dr.Marc Magod GI   Procedures/Significant Events:  6/12 transfused 2 units PRBC 6/13 transfused 2 units PRBC 6/13 colonoscopy;ascending colon polyps x8 , diverticulosis in ascending colon and descending colon and sigmoid colon, cecal polyp x1, blood in ileum, rectal polyp x1 6/13 EGD;hiatal hernia, schatzki's ring, mild gastritis 6/14 Transfuse 2 units PRBC 6/15 Transfuse 2 units PRBC   Cultures NA  Antimicrobials: NA   Devices NA   LINES / TUBES:  NA    Continuous Infusions:    Subjective: 6/15 A/O 4, NAD. Negative abdominal pain, negative CP, negative SOB. States just began to have bright red blood per rectum over the weekend.    Objective: Filed Vitals:   03/07/16 1632 03/07/16 1645 03/07/16 1700 03/07/16 1930  BP: 137/65  158/87 123/66  Pulse: 76 87 87 81  Temp:  98.2 F (36.8 C) 98.2 F (36.8 C) 98.4  F (36.9 C)  TempSrc:  Oral Oral Oral  Resp:  22 17 19   Height:      Weight:      SpO2:  99% 100% 100%    Intake/Output Summary (Last 24 hours) at 03/07/16 2005 Last data filed at 03/07/16 1700  Gross per 24 hour  Intake    648 ml  Output      0 ml  Net    648 ml   Filed Weights    03/06/16 1934 03/07/16 0344 03/07/16 0931  Weight: 61.2 kg (134 lb 14.7 oz) 60.192 kg (132 lb 11.2 oz) 59.875 kg (132 lb)    Examination:  General: A/O 4, NAD, No acute respiratory distress Eyes: negative scleral hemorrhage, negative anisocoria, negative icterus ENT: Negative Runny nose, negative gingival bleeding, Neck:  Negative scars, masses, torticollis, lymphadenopathy, JVD Lungs: Clear to auscultation bilaterally without wheezes or crackles Cardiovascular: Regular rate and rhythm without murmur gallop or rub normal S1 and S2 Abdomen: negative abdominal pain, nondistended, positive soft, bowel sounds, no rebound, no ascites, no appreciable mass Extremities: No significant cyanosis. Right BKA stable, LLE +1 pedal edema Skin: Negative rashes, lesions, ulcers Psychiatric:  Negative depression, negative anxiety, negative fatigue, negative mania  Central nervous system:  Cranial nerves II through XII intact, tongue/uvula midline, all extremities muscle strength 5/5, sensation intact throughout, finger nose finger bilateral within normal limits, quick finger touch bilateral within normal limits, negative dysarthria, negative expressive aphasia, negative receptive aphasia.  .     Data Reviewed: Care during the described time interval was provided by me .  I have reviewed this patient's available data, including medical history, events of note, physical examination, and all test results as part of my evaluation. I have personally reviewed and interpreted all radiology studies.  CBC:  Recent Labs Lab 03/03/16 2002  03/04/16 0441  03/06/16 1252 03/06/16 1535 03/06/16 2039 03/07/16 0159 03/07/16 0822 03/07/16 1841  WBC 6.1  --  5.2  --  5.3 4.2  --   --   --   --   NEUTROABS 3.9  --   --   --  3.6 2.9  --   --   --   --   HGB 9.4*  < > 6.9*  < > 6.5* 5.8* 8.7* 6.4* 7.9* 8.3*  HCT 30.9*  < > 22.8*  < > 20.0* 18.0* 27.0* 19.8* 24.2* 25.4*  MCV 92.0  --  91.2  --  84.4 84.9  --   --    --   --   PLT 191  --  165  --  128* 115*  --   --   --   --   < > = values in this interval not displayed. Basic Metabolic Panel:  Recent Labs Lab 03/03/16 2002 03/03/16 2030 03/04/16 0441 03/05/16 0407 03/06/16 1851 03/07/16 0159  NA 138 139 138 134* 131* 135  K 4.3 4.2 4.5 4.3 4.3 3.4*  CL 97* 97* 101 95* 101 100*  CO2 28  --  26 29 19* 30  GLUCOSE 138* 134* 141* 95 145* 136*  BUN 79* 73* 83* 42* 57* 16  CREATININE 9.16* 9.10* 9.31* 6.10* 8.40* 3.40*  CALCIUM 8.8*  --  8.3* 8.7* 7.8* 7.8*  MG  --   --   --   --   --  1.7  PHOS  --   --   --  5.5* 8.0*  --    GFR: Estimated Creatinine Clearance: 11.8 mL/min (  by C-G formula based on Cr of 3.4). Liver Function Tests:  Recent Labs Lab 03/04/16 0441 03/05/16 0407 03/06/16 1851  AST 12*  --   --   ALT 8*  --   --   ALKPHOS 53  --   --   BILITOT 0.5  --   --   PROT 5.1*  --   --   ALBUMIN 2.4* 2.5* 2.0*   No results for input(s): LIPASE, AMYLASE in the last 168 hours. No results for input(s): AMMONIA in the last 168 hours. Coagulation Profile: No results for input(s): INR, PROTIME in the last 168 hours. Cardiac Enzymes:  Recent Labs Lab 03/03/16 2002 03/04/16 0441 03/04/16 1100  TROPONINI 0.09* 0.10* 0.11*   BNP (last 3 results) No results for input(s): PROBNP in the last 8760 hours. HbA1C: No results for input(s): HGBA1C in the last 72 hours. CBG:  Recent Labs Lab 03/07/16 0024 03/07/16 0313 03/07/16 0738 03/07/16 1203 03/07/16 1620  GLUCAP 191* 103* 99 112* 111*   Lipid Profile:  Recent Labs  03/07/16 0159  CHOL 60  HDL 23*  LDLCALC 22  TRIG 73  CHOLHDL 2.6   Thyroid Function Tests: No results for input(s): TSH, T4TOTAL, FREET4, T3FREE, THYROIDAB in the last 72 hours. Anemia Panel: No results for input(s): VITAMINB12, FOLATE, FERRITIN, TIBC, IRON, RETICCTPCT in the last 72 hours. Urine analysis:    Component Value Date/Time   COLORURINE YELLOW 06/22/2014 [place]  06/22/2014 0531   LABSPEC 1.015 06/22/2014 0531   PHURINE 6.0 06/22/2014 0531   GLUCOSEU 100* 06/22/2014 0531   HGBUR SMALL* 06/22/2014 0531   HGBUR trace-lysed 06/03/2008 0855   BILIRUBINUR NEGATIVE 06/22/2014 0531   KETONESUR TRACE* 06/22/2014 0531   PROTEINUR 100* 06/22/2014 0531   UROBILINOGEN 0.2 06/22/2014 0531   NITRITE NEGATIVE 06/22/2014 0531   LEUKOCYTESUR NEGATIVE 06/22/2014 0531   Sepsis Labs: @LABRCNTIP (procalcitonin:4,lacticidven:4)  ) Recent Results (from the past 240 hour(s))  MRSA PCR Screening     Status: None   Collection Time: 03/03/16 10:09 PM  Result Value Ref Range Status   MRSA by PCR NEGATIVE NEGATIVE Final    Comment:        The GeneXpert MRSA Assay (FDA approved for NASAL specimens only), is one component of a comprehensive MRSA colonization surveillance program. It is not intended to diagnose MRSA infection nor to guide or monitor treatment for MRSA infections.          Radiology Studies: Nm Gi Blood Loss  03/06/2016  CLINICAL DATA:  Bleeding per rectum x4 days. EXAM: NUCLEAR MEDICINE GASTROINTESTINAL BLEEDING SCAN TECHNIQUE: Sequential abdominal images were obtained following intravenous administration of Tc-95m labeled red blood cells. RADIOPHARMACEUTICALS:  25.0 mCi Tc-33m in-vitro labeled red cells. COMPARISON:  Abdominal series 215 2017. CT 12/31/2014. Nuclear medicine scan 09/28/2014 and 09/24/2014. FINDINGS: Standard GI bleeding series obtained. Activity noted in the upper abdomen bilaterally and in the lower pelvis bilaterally most likely secondary to genitourinary normal activity. Perineal activity again noted. No clearcut GI bleed noted. IMPRESSION: Negative exam. Electronically Signed   By: Marcello Moores  Register   On: 03/06/2016 14:22        Scheduled Meds: . amLODipine  5 mg Oral Daily  . atorvastatin  80 mg Oral q1800  . brinzolamide  1 drop Both Eyes BID   And  . brimonidine  1 drop Both Eyes BID  . calcitRIOL  1.5 mcg Oral Q  M,W,F-HD  . carvedilol  12.5 mg Oral BID WC  . [  START ON 03/15/2016] darbepoetin (ARANESP) injection - DIALYSIS  100 mcg Intravenous Q Fri-HD  . feeding supplement  1 Container Oral TID WC & HS  . feeding supplement (PRO-STAT SUGAR FREE 64)  30 mL Oral BID  . insulin aspart  0-9 Units Subcutaneous Q4H  . isosorbide mononitrate  15 mg Oral QHS  . latanoprost  1 drop Both Eyes QHS  . multivitamin  1 tablet Oral Daily  . pantoprazole  40 mg Oral QAC breakfast  . sevelamer carbonate  800 mg Oral With snacks  . sodium chloride flush  3 mL Intravenous Q12H   Continuous Infusions:    LOS: 4 days    Time spent: 40 minutes    Ericka Marcellus, Geraldo Docker, MD [facility] Pager [phone]   If 7PM-7AM, please contact night-coverage [email or web address] Password [facility] 03/07/2016, 8:05 PM

## 2016-03-07 NOTE — Telephone Encounter (Signed)
LM to return call when available

## 2016-03-07 NOTE — Care Management Important Message (Signed)
Important Message  Patient Details  Name: Randy [name] MRN: [id number] Date of Birth: 1932-07-07   Medicare Important Message Given:  Yes    Nathen May 03/07/2016, 10:56 AM

## 2016-03-07 NOTE — Progress Notes (Signed)
CRITICAL VALUE ALERT  Critical value received:  hgb 6.4  Date of notification:  03/07/2016  Time of notification:  0227  Critical value read back: Yes  Nurse who received alert:  Remo Lipps, RN  MD notified (1st page):  [facility]  Time of first page:  [phone]

## 2016-03-07 NOTE — Telephone Encounter (Signed)
Please let family know I'm sorry to hear the patient is in the hospital. I reviewed the notes of Dr Acie Fredrickson, one of my partners who has been seeing Randy [name] in the hospital and agree with his plans from a heart standpoint.    Zandra Abts MD

## 2016-03-07 NOTE — Progress Notes (Signed)
Pt's chest pain relieved with nitro *2. Will continue to monitor

## 2016-03-07 NOTE — Progress Notes (Signed)
Randy [name] 9:03 AM  Subjective: Patient with no new complaints and no abdominal pain and his bleeding is less but he is still having it and his endoscopy and colonoscopy pictures by Dr. Oneida Alar was reviewed and his bleeding scan was negative  Objective: Vital signs stable afebrile no acute distress abdomen is soft nontender hemoglobin increased after transfusion BUN and creatinine okay for him  Assessment: GI bleeding AVMs versus right sided diverticuli  Plan: We discussed capsule endoscopy and will proceed this morning since he is nothing by mouth with further workup and plans pending those findings and would recommend if signs of bleeding increase to proceed with a delayed picture in nuclear medicine from his injection yesterday  [facility]  Pager [phone] After 5PM or if no answer call [phone]

## 2016-03-07 NOTE — Progress Notes (Signed)
Hypoglycemic Event  CBG: 62 (2327)  Treatment: oral glucose gel & gingerale   Symptoms: None  Follow-up CBG: 114  Time: 2353    Possible Reasons for Event: Clear Liquid Diet?   Randy [name] A

## 2016-03-07 NOTE — Care Management Important Message (Signed)
Important Message  Patient Details  Name: Randy [name] MRN: [id number] Date of Birth: [birth year]   Medicare Important Message Given:  Yes    Nathen May 03/07/2016, 10:56 AM

## 2016-03-07 NOTE — Progress Notes (Signed)
Primary cardiologist: Dr. Carlyle Dolly  Seen for followup: CAD, abnormal troponin I  Subjective:    82 y.o. male, with ESRD on HD (M, W, F) , Hypertension, Anemia, apparently c/o bleeding per rectum   Was found to have minimal troponin elevations - we were asked to comment .   He has angina several times a month - takes SL NTG for relief.   No chest pain this morning. Feels dizzy however with recent hypotension. Reports having recurrent stools with GI bleeding (Gastroenterology service aware).  Objective:   Temp:  [97.4 F (36.3 C)-99.1 F (37.3 C)] 98.6 F (37 C) (06/15 0630) Pulse Rate:  [59-111] 85 (06/15 0630) Resp:  [11-20] 11 (06/15 0721) BP: (67-166)/(39-82) 127/60 mmHg (06/15 0721) SpO2:  [96 %-100 %] 100 % (06/15 0721) Weight:  [132 lb 11.2 oz (60.192 kg)-138 lb 0.1 oz (62.6 kg)] 132 lb 11.2 oz (60.192 kg) (06/15 0344) Last BM Date: 03/06/16  Filed Weights   03/06/16 1510 03/06/16 1934 03/07/16 0344  Weight: 138 lb 0.1 oz (62.6 kg) 134 lb 14.7 oz (61.2 kg) 132 lb 11.2 oz (60.192 kg)    Intake/Output Summary (Last 24 hours) at 03/07/16 0835 Last data filed at 03/07/16 0619  Gross per 24 hour  Intake   1652 ml  Output   1590 ml  Net     62 ml    Telemetry: Sinus rhythm with blocked PACs.  Exam:  General: Chronically ill-appearing male, no acute distress.  Lungs: Decreased breath sounds without crackles.  Cardiac: RRR with ectopy and 3/6 systolic murmur.  Abdomen: No rebound, bowel sounds present.  Extremities: Status post right BKA and left AKA. Left arm AV fistula.  Lab Results:  Basic Metabolic Panel:  Recent Labs Lab 03/05/16 0407 03/06/16 1851 03/07/16 0159  NA 134* 131* 135  K 4.3 4.3 3.4*  CL 95* 101 100*  CO2 29 19* 30  GLUCOSE 95 145* 136*  BUN 42* 57* 16  CREATININE 6.10* 8.40* 3.40*  CALCIUM 8.7* 7.8* 7.8*  MG  --   --  1.7    Liver Function Tests:  Recent Labs Lab 03/04/16 0441 03/05/16 0407 03/06/16 1851  AST  12*  --   --   ALT 8*  --   --   ALKPHOS 53  --   --   BILITOT 0.5  --   --   PROT 5.1*  --   --   ALBUMIN 2.4* 2.5* 2.0*    CBC:  Recent Labs Lab 03/04/16 0441  03/06/16 1252 03/06/16 1535 03/06/16 2039 03/07/16 0159  WBC 5.2  --  5.3 4.2  --   --   HGB 6.9*  < > 6.5* 5.8* 8.7* 6.4*  HCT 22.8*  < > 20.0* 18.0* 27.0* 19.8*  MCV 91.2  --  84.4 84.9  --   --   PLT 165  --  128* 115*  --   --   < > = values in this interval not displayed.  Cardiac Enzymes:  Recent Labs Lab 03/03/16 2002 03/04/16 0441 03/04/16 1100  TROPONINI 0.09* 0.10* 0.11*   Echocardiogram 08/04/2015: Study Conclusions  - Left ventricle: The cavity size was normal. Wall thickness was  increased increased in a pattern of mild to moderate LVH.  Incidentally noted transverse false tendon in LV. Systolic  function was moderately to severely reduced. The estimated  ejection fraction was in the range of 30% to 35%. Diffuse  hypokinesis. There is severe hypokinesis of  the  basal-midinferolateral and inferior myocardium. Doppler  parameters are consistent with restrictive physiology, indicative  of decreased left ventricular diastolic compliance and/or  increased left atrial pressure. - Aortic valve: Moderately calcified annulus. Trileaflet; mildly  calcified leaflets. Cusp separation was reduced. There was  moderate stenosis. There was trivial regurgitation. Mean gradient  (S): 16 mm Hg. VTI ratio of LVOT to aortic valve: 0.4. Valve area  (VTI): 1.24 cm^2. Valve area (Vmax): 1.21 cm^2. - Mitral valve: Calcified annulus. There was trivial regurgitation. - Left atrium: The atrium was moderately dilated. - Right atrium: The atrium was moderately to severely dilated.  Central venous pressure (est): 3 mm Hg. - Atrial septum: No defect or patent foramen ovale was identified. - Tricuspid valve: There was mild regurgitation. - Pulmonary arteries: PA peak pressure: 38 mm Hg (S). - Pericardium,  extracardiac: There was no pericardial effusion.  Impressions:  - Mild to moderate LVH with LVEF approximately 30%, diffuse  hypokinesis, most severe in the mid to basal inferior and  inferolateral wall. Diastolic filling pattern consistent with  restrictive physiology. Moderate left atrial enlargement.  Moderate calcific aortic stenosis with trivial aortic  regurgitation. Mild tricuspid regurgitation with PASP 38 mmHg.  Moderate to severe right atrial enlargement. Compared to the  previous study from December 2015, there has been further  reduction in LVEF, and progression in degree of aortic stenosis.  Cardiac catheterization 08/17/2014: Coronary dominance: right  Left mainstem: Short, no significant disease.   Left anterior descending (LAD): There was a moderate D1 that branched early with 40% ostial stenosis. This was followed by a large septal perforator. There was 40-50% proximal LAD stenosis at the takeoff of the septal perforator. 30% mid LAD stenosis at D2.   Left circumflex (LCx): There was a large ramus that branched early into a superior and and inferior division, both vessels were moderate in size. Both divisions of the ramus had relatively up to 80% proximal stenosis. The AV LCx itself had luminal irregularities.   Right coronary artery (RCA): There was an early large acute marginal with 40% proximal and 40% mid vessel stenosis. The RCA had diffuse luminal irregularities and up to 40% mid vessel stenosis.   Medications:   Scheduled Medications: . sodium chloride   Intravenous Once  . sodium chloride   Intravenous Once  . sodium chloride   Intravenous Once  . amLODipine  5 mg Oral Daily  . atorvastatin  80 mg Oral q1800  . brinzolamide  1 drop Both Eyes BID   And  . brimonidine  1 drop Both Eyes BID  . calcitRIOL  1.5 mcg Oral Q M,W,F-HD  . carvedilol  12.5 mg Oral BID WC  . feeding supplement  1 Container Oral TID WC & HS  . feeding supplement  (PRO-STAT SUGAR FREE 64)  30 mL Oral BID  . insulin aspart  0-9 Units Subcutaneous Q4H  . isosorbide mononitrate  15 mg Oral QHS  . latanoprost  1 drop Both Eyes QHS  . magnesium sulfate 1 - 4 g bolus IVPB  2 g Intravenous Once  . multivitamin  1 tablet Oral Daily  . pantoprazole  40 mg Oral QAC breakfast  . potassium chloride  10 mEq Intravenous Q1 Hr x 2  . sevelamer carbonate  800 mg Oral With snacks  . sodium chloride flush  3 mL Intravenous Q12H    PRN Medications: sodium chloride, sodium chloride, sodium chloride, sodium chloride, acetaminophen **OR** acetaminophen, gabapentin, lidocaine (PF), lidocaine-prilocaine, nitroGLYCERIN, oxyCODONE, pentafluoroprop-tetrafluoroeth,  polyvinyl alcohol   Assessment:   1. Mildly abnormal troponin I consistent with demand ischemia in light of comorbid illnesses and physiologic stressors. Has knows moderate CAD . Takes NTG regularly. Troponin levels were minimally elevated.    2. Moderate multivessel CAD by cardiac catheterization in 2015 that has been managed medically. He has regular recurring angina. Outpatient medical regimen includes aspirin, Coreg, lisinopril, Norvasc, and Imdur. Continue medical therapy  No plans for cath or other ischemic evaluation  at this time - he is still actively bleeding    3. Hyperlipidemia, on Lipitor.  4. Ischemic cardiomyopathy with LVEF approximately 30%.  5. Moderate calcific aortic stenosis.  6. ESRD on hemodialysis.  7. Recurrent GI bleed   Gastroenterology is managing, colonoscopy deferred so far with plan for possible tagged RBC scan. He is getting repeat PRBC transfusion and fluid bolus.    Mertie Moores, MD  03/07/2016 8:35 AM    [address],  [place], [place]  [address] Pager [phone] Phone: [phone]; Fax: [phone]

## 2016-03-08 ENCOUNTER — Inpatient Hospital Stay (HOSPITAL_COMMUNITY): Payer: Medicare Other

## 2016-03-08 DIAGNOSIS — R079 Chest pain, unspecified: Secondary | ICD-10-CM

## 2016-03-08 LAB — RENAL FUNCTION PANEL
Albumin: 2.3 g/dL — ABNORMAL LOW (ref 3.5–5.0)
Anion gap: 6 (ref 5–15)
BUN: 8 mg/dL (ref 6–20)
CHLORIDE: 101 mmol/L (ref 101–111)
CO2: 28 mmol/L (ref 22–32)
CREATININE: 2.6 mg/dL — AB (ref 0.61–1.24)
Calcium: 8 mg/dL — ABNORMAL LOW (ref 8.9–10.3)
GFR calc Af Amer: 25 mL/min — ABNORMAL LOW (ref >60)
GFR calc non Af Amer: 21 mL/min — ABNORMAL LOW (ref >60)
GLUCOSE: 196 mg/dL — AB (ref 65–99)
POTASSIUM: 3.9 mmol/L (ref 3.5–5.1)
Phosphorus: 2.4 mg/dL — ABNORMAL LOW (ref 2.5–4.6)
Sodium: 135 mmol/L (ref 135–145)

## 2016-03-08 LAB — ECHOCARDIOGRAM COMPLETE
AO mean calculated velocity dopler: 244 cm/s
AOVTI: 77.2 cm
AV Area VTI index: 0.58 cm2/m2
AV Area VTI: 0.84 cm2
AV Area mean vel: 0.76 cm2
AV VEL mean LVOT/AV: 0.27
AV area mean vel ind: 0.48 cm2/m2
AV vel: 0.92
AVA: 0.92 cm2
AVG: 27 mmHg
AVPG: 49 mmHg
AVPHT: 346 ms
AVPKVEL: 351 cm/s
Ao pk vel: 0.3 m/s
CHL CUP AV PEAK INDEX: 0.53
CHL CUP AV VALUE AREA INDEX: 0.58
CHL CUP DOP CALC LVOT VTI: 24.9 cm
EERAT: 15.17
EWDT: 206 ms
FS: 22 % — AB (ref 28–44)
Height: 57 in
IV/PV OW: 1.11
LA vol index: 66.6 mL/m2
LA vol: 105 mL
LADIAMINDEX: 2.73 cm/m2
LASIZE: 43 mm
LAVOLA4C: 78.7 mL
LDCA: 2.84 cm2
LEFT ATRIUM END SYS DIAM: 43 mm
LV E/e' medial: 15.17
LV TDI E'LATERAL: 6.58
LV TDI E'MEDIAL: 4.56
LVEEAVG: 15.17
LVELAT: 6.58 cm/s
LVOT SV: 71 mL
LVOT diameter: 19 mm
LVOT peak VTI: 0.32 cm
LVOTPV: 104 cm/s
Lateral S' vel: 11.4 cm/s
MV Dec: 206
MV pk E vel: 99.8 m/s
MVPG: 4 mmHg
MVPKAVEL: 101 m/s
PW: 9.53 mm — AB (ref 0.6–1.1)
TAPSE: 17 mm
Weight: 2116.42 oz

## 2016-03-08 LAB — TROPONIN I
TROPONIN I: 0.49 ng/mL — AB (ref <0.031)
Troponin I: 0.49 ng/mL — ABNORMAL HIGH (ref <0.031)

## 2016-03-08 LAB — BASIC METABOLIC PANEL
Anion gap: 8 (ref 5–15)
BUN: 37 mg/dL — ABNORMAL HIGH (ref 6–20)
CALCIUM: 7.9 mg/dL — AB (ref 8.9–10.3)
CO2: 25 mmol/L (ref 22–32)
CREATININE: 5.22 mg/dL — AB (ref 0.61–1.24)
Chloride: 98 mmol/L — ABNORMAL LOW (ref 101–111)
GFR calc non Af Amer: 9 mL/min — ABNORMAL LOW (ref >60)
GFR, EST AFRICAN AMERICAN: 11 mL/min — AB (ref >60)
GLUCOSE: 126 mg/dL — AB (ref 65–99)
Potassium: 4 mmol/L (ref 3.5–5.1)
Sodium: 131 mmol/L — ABNORMAL LOW (ref 135–145)

## 2016-03-08 LAB — CBC
HEMATOCRIT: 29.6 % — AB (ref 39.0–52.0)
Hemoglobin: 9.6 g/dL — ABNORMAL LOW (ref 13.0–17.0)
MCH: 28.2 pg (ref 26.0–34.0)
MCHC: 32.4 g/dL (ref 30.0–36.0)
MCV: 86.8 fL (ref 78.0–100.0)
PLATELETS: 137 10*3/uL — AB (ref 150–400)
RBC: 3.41 MIL/uL — ABNORMAL LOW (ref 4.22–5.81)
RDW: 17.1 % — AB (ref 11.5–15.5)
WBC: 4.8 10*3/uL (ref 4.0–10.5)

## 2016-03-08 LAB — HEMOGLOBIN AND HEMATOCRIT, BLOOD
HCT: 25.1 % — ABNORMAL LOW (ref 39.0–52.0)
HEMATOCRIT: 24.1 % — AB (ref 39.0–52.0)
HEMATOCRIT: 27.2 % — AB (ref 39.0–52.0)
HEMOGLOBIN: 8.1 g/dL — AB (ref 13.0–17.0)
HEMOGLOBIN: 8.7 g/dL — AB (ref 13.0–17.0)
Hemoglobin: 8.3 g/dL — ABNORMAL LOW (ref 13.0–17.0)

## 2016-03-08 LAB — MAGNESIUM: Magnesium: 2.2 mg/dL (ref 1.7–2.4)

## 2016-03-08 LAB — GLUCOSE, CAPILLARY
GLUCOSE-CAPILLARY: 103 mg/dL — AB (ref 65–99)
GLUCOSE-CAPILLARY: 94 mg/dL (ref 65–99)
Glucose-Capillary: 194 mg/dL — ABNORMAL HIGH (ref 65–99)
Glucose-Capillary: 139 mg/dL — ABNORMAL HIGH (ref 65–99)

## 2016-03-08 MED ORDER — OXYCODONE HCL 5 MG PO TABS
ORAL_TABLET | ORAL | Status: AC
  Administered 2016-03-08: 5 mg via ORAL
  Filled 2016-03-08: qty 1

## 2016-03-08 MED ORDER — LIDOCAINE HCL (PF) 1 % IJ SOLN: 5.0000 mL | INTRAMUSCULAR | Status: DC | PRN

## 2016-03-08 MED ORDER — SODIUM CHLORIDE 0.9 % IV SOLN: 100.0000 mL | INTRAVENOUS | Status: DC | PRN

## 2016-03-08 MED ORDER — LIDOCAINE-PRILOCAINE 2.5-2.5 % EX CREA: 1.0000 "application " | TOPICAL_CREAM | CUTANEOUS | Status: DC | PRN

## 2016-03-08 MED ORDER — PENTAFLUOROPROP-TETRAFLUOROETH EX AERO: 1.0000 "application " | INHALATION_SPRAY | CUTANEOUS | Status: DC | PRN

## 2016-03-08 MED ORDER — HEPARIN SODIUM (PORCINE) 1000 UNIT/ML DIALYSIS: 1000.0000 [IU] | INTRAMUSCULAR | Status: DC | PRN

## 2016-03-08 MED ORDER — ALTEPLASE 2 MG IJ SOLR: 2.0000 mg | Freq: Once | INTRAMUSCULAR | Status: DC | PRN

## 2016-03-08 MED ORDER — ACETAMINOPHEN 325 MG PO TABS
ORAL_TABLET | ORAL | Status: AC
  Administered 2016-03-08: 650 mg via ORAL
  Filled 2016-03-08: qty 2

## 2016-03-08 NOTE — Progress Notes (Deleted)
[facility] Progress Note   Subjective: no c/o.  2u prbc yesterday, 8 units overall this admission.  No abd pain.    Filed Vitals:   03/08/16 0200 03/08/16 0400 03/08/16 0427 03/08/16 0834  BP: 123/60 120/59  142/66  Pulse: 76 76  71  Temp:  98.2 F (36.8 C)  98.3 F (36.8 C)  TempSrc:  Oral  Oral  Resp: 13 15  15   Height:      Weight:   60 kg (132 lb 4.4 oz)   SpO2: 100% 100%  100%    Inpatient medications: . amLODipine  5 mg Oral Daily  . atorvastatin  80 mg Oral q1800  . brinzolamide  1 drop Both Eyes BID   And  . brimonidine  1 drop Both Eyes BID  . calcitRIOL  1.5 mcg Oral Q M,W,F-HD  . carvedilol  12.5 mg Oral BID WC  . [START ON 03/15/2016] darbepoetin (ARANESP) injection - DIALYSIS  100 mcg Intravenous Q Fri-HD  . feeding supplement  1 Container Oral TID WC & HS  . feeding supplement (PRO-STAT SUGAR FREE 64)  30 mL Oral BID  . insulin aspart  0-9 Units Subcutaneous Q4H  . isosorbide mononitrate  15 mg Oral QHS  . latanoprost  1 drop Both Eyes QHS  . multivitamin  1 tablet Oral Daily  . pantoprazole  40 mg Oral QAC breakfast  . sevelamer carbonate  800 mg Oral With snacks  . sodium chloride flush  3 mL Intravenous Q12H     acetaminophen **OR** acetaminophen, gabapentin, nitroGLYCERIN, oxyCODONE, polyvinyl alcohol  Exam: Alert, no distress, calm No jvd Chest clear bilat RRR 2/6 sem, no RG ABd soft ntnd Rectal tube draining bloody liquid  L BKA/ R AKA, no LE edema LFA AVF +Bruit Ox 3, nf  Dialysis: [facility] MWF  3.5h  56.5kg  2/2.25 bath  P4  Hep none  LFA AVF Mircera 150 mcg IV q 2 weeks (last dose 02/21/16 100 mcg IV HGB 10.2 03/09/16) Calcitriol 1.75 mcg IV q MWF (Last PTH 651 02/07/16)      Assessment: 1  Lower GI bleed - w/u in progress, sp 8 units prbc 2  Anemia Hb 8's, getting darbe 100/ wk here (last 6/15) 3  HTN on about 1/2 of his home meds , low dose coreg/ norvasc, stable 4  Vol up 3-4kg 5  MBD no chg 6  PAD bilat amputee 7   Hx CVA 8  CM EF 30%/ aortic stenosis/ hx CAD w chron stable angina on med Rx  Plan - HD today, UF 3 L   Kelly Splinter MD [facility] pager [phone]    cell [phone] 03/08/2016, 9:12 AM    Recent Labs Lab 03/05/16 0407 03/06/16 1851 03/07/16 0159 03/08/16 0019  NA 134* 131* 135 131*  K 4.3 4.3 3.4* 4.0  CL 95* 101 100* 98*  CO2 29 19* 30 25  GLUCOSE 95 145* 136* 126*  BUN 42* 57* 16 37*  CREATININE 6.10* 8.40* 3.40* 5.22*  CALCIUM 8.7* 7.8* 7.8* 7.9*  PHOS 5.5* 8.0*  --   --     Recent Labs Lab 03/04/16 0441 03/05/16 0407 03/06/16 1851  AST 12*  --   --   ALT 8*  --   --   ALKPHOS 53  --   --   BILITOT 0.5  --   --   PROT 5.1*  --   --   ALBUMIN 2.4* 2.5* 2.0*  Recent Labs Lab 03/03/16 2002  03/04/16 0441  03/06/16 1252 03/06/16 1535  03/07/16 0822 03/07/16 1841 03/08/16 0019  WBC 6.1  --  5.2  --  5.3 4.2  --   --   --   --   NEUTROABS 3.9  --   --   --  3.6 2.9  --   --   --   --   HGB 9.4*  < > 6.9*  < > 6.5* 5.8*  < > 7.9* 8.3* 8.1*  HCT 30.9*  < > 22.8*  < > 20.0* 18.0*  < > 24.2* 25.4* 24.1*  MCV 92.0  --  91.2  --  84.4 84.9  --   --   --   --   PLT 191  --  165  --  128* 115*  --   --   --   --   < > = values in this interval not displayed. Iron/TIBC/Ferritin/ %Sat    Component Value Date/Time   IRON 24* 10/25/2015 1628   TIBC 113* 10/25/2015 1628   FERRITIN 1242* 10/25/2015 1628   IRONPCTSAT 21 10/25/2015 1628

## 2016-03-08 NOTE — Progress Notes (Signed)
Patient arrived to unit per bed.  Reviewed treatment plan and this RN agrees.  Report received from bedside RN, Troyce.  Consent verified.  Patient A & O X 4. Lung sounds clear to ausculation in all fields. Generalized edema. Cardiac: 1st degree HB.  Prepped LLAVF with alcohol and cannulated with two 15 gauge needles.  Pulsation of blood noted.  Flushed access well with saline per protocol.  Connected and secured lines and initiated tx at 0935.  UF goal of 3500 mL and net fluid removal of 3000 mL.  Will continue to monitor.

## 2016-03-08 NOTE — Progress Notes (Signed)
PROCEDURE NOTE  03/03/2016 - 03/07/2016  9:18 AM  PATIENT:  Randy [name]  82 y.o. male  PRE-OPERATIVE DIAGNOSIS:  GI bleed  POST-OPERATIVE DIAGNOSIS: Almost certainly right colon bleeding  PROCEDURE: Capsule endoscopy  SURGEON:  Surgeon(s): Clarene Essex, MD  ASSESSMENT/FINDINGS:  A little dark material in the ileum with some slightly fresher blood in the right colon which gets darker distally in the colon  PLAN OF CARE: Consider angiogram with possible therapy versus repeat colonoscopy versus surgical options and reevaluate blood thinner use in the future

## 2016-03-08 NOTE — Progress Notes (Signed)
[facility] Progress Note   Subjective: no c/o.  2u prbc yesterday, 8 units overall this admission.  No abd pain.    Filed Vitals:   03/08/16 1030 03/08/16 1100 03/08/16 1130 03/08/16 1200  BP: 127/76 126/71 112/93 136/62  Pulse: 72 80 76 71  Temp:      TempSrc:      Resp:      Height:      Weight:      SpO2:        Inpatient medications: . amLODipine  5 mg Oral Daily  . atorvastatin  80 mg Oral q1800  . brinzolamide  1 drop Both Eyes BID   And  . brimonidine  1 drop Both Eyes BID  . calcitRIOL  1.5 mcg Oral Q M,W,F-HD  . carvedilol  12.5 mg Oral BID WC  . [START ON 03/15/2016] darbepoetin (ARANESP) injection - DIALYSIS  100 mcg Intravenous Q Fri-HD  . feeding supplement  1 Container Oral TID WC & HS  . feeding supplement (PRO-STAT SUGAR FREE 64)  30 mL Oral BID  . insulin aspart  0-9 Units Subcutaneous Q4H  . isosorbide mononitrate  15 mg Oral QHS  . latanoprost  1 drop Both Eyes QHS  . multivitamin  1 tablet Oral Daily  . pantoprazole  40 mg Oral QAC breakfast  . sevelamer carbonate  800 mg Oral With snacks  . sodium chloride flush  3 mL Intravenous Q12H     sodium chloride, sodium chloride, acetaminophen **OR** acetaminophen, alteplase, gabapentin, heparin, lidocaine (PF), lidocaine-prilocaine, nitroGLYCERIN, oxyCODONE, pentafluoroprop-tetrafluoroeth, polyvinyl alcohol  Exam: Alert, no distress, calm No jvd Chest clear bilat RRR 2/6 sem, no RG ABd soft ntnd Rectal tube draining bloody liquid  L BKA/ R AKA, no LE edema LFA AVF +Bruit Ox 3, nf  Dialysis: [facility] MWF  3.5h  56.5kg  2/2.25 bath  P4  Hep none  LFA AVF Mircera 150 mcg IV q 2 weeks (last dose 02/21/16 100 mcg IV HGB 10.2 03/09/16) Calcitriol 1.75 mcg IV q MWF (Last PTH 651 02/07/16)      Assessment: 1  Lower GI bleed - w/u in progress, sp 8 units prbc 2  Anemia Hb 8's, getting darbe 100/ wk here (last 6/15) 3  HTN on about 1/2 of his home meds , low dose coreg/ norvasc, stable 4   Vol up 3-4kg 5  MBD no chg 6  PAD bilat amputee 7  Hx CVA 8  CM EF 30%/ aortic stenosis/ hx CAD w chron stable angina on med Rx  Plan - HD today, UF 3 L   Kelly Splinter MD [facility] pager [phone]    cell [phone] 03/08/2016, 12:11 PM    Recent Labs Lab 03/05/16 0407 03/06/16 1851 03/07/16 0159 03/08/16 0019  NA 134* 131* 135 131*  K 4.3 4.3 3.4* 4.0  CL 95* 101 100* 98*  CO2 29 19* 30 25  GLUCOSE 95 145* 136* 126*  BUN 42* 57* 16 37*  CREATININE 6.10* 8.40* 3.40* 5.22*  CALCIUM 8.7* 7.8* 7.8* 7.9*  PHOS 5.5* 8.0*  --   --     Recent Labs Lab 03/04/16 0441 03/05/16 0407 03/06/16 1851  AST 12*  --   --   ALT 8*  --   --   ALKPHOS 53  --   --   BILITOT 0.5  --   --   PROT 5.1*  --   --   ALBUMIN 2.4* 2.5* 2.0*  Recent Labs Lab 03/03/16 2002  03/04/16 0441  03/06/16 1252 03/06/16 1535  03/07/16 1841 03/08/16 0019 03/08/16 0930  WBC 6.1  --  5.2  --  5.3 4.2  --   --   --   --   NEUTROABS 3.9  --   --   --  3.6 2.9  --   --   --   --   HGB 9.4*  < > 6.9*  < > 6.5* 5.8*  < > 8.3* 8.1* 8.3*  HCT 30.9*  < > 22.8*  < > 20.0* 18.0*  < > 25.4* 24.1* 25.1*  MCV 92.0  --  91.2  --  84.4 84.9  --   --   --   --   PLT 191  --  165  --  128* 115*  --   --   --   --   < > = values in this interval not displayed. Iron/TIBC/Ferritin/ %Sat    Component Value Date/Time   IRON 24* 10/25/2015 1628   TIBC 113* 10/25/2015 1628   FERRITIN 1242* 10/25/2015 1628   IRONPCTSAT 21 10/25/2015 1628

## 2016-03-08 NOTE — Progress Notes (Signed)
PROGRESS NOTE    Randy [name]  [id number] DOB: [birth year] DOA: 03/03/2016 PCP: [name], MD   Brief Narrative:  82 y.o. BM PMHx CVA, ESRD on HD (M, W, F) , HTN,Nonischemic cardiomyopathy LVEF 35-40% , Myocarditis, PVD, CAD multiple native artery, HLD, Hypertension, Anemia, S/P Right BKA  Apparently c/o bleeding per rectum starting this afternoon. Pt states that there was blood on the toilet paper and the toilet bowel. +n/v x1, No blood. Denies fever, chills, abd pain, diarrhea, black stool.    Assessment & Plan:   Principal Problem:   GI bleeding Active Problems:   ESRD on dialysis [facility])   Essential hypertension   GI bleed   Hyperglycemia   Coronary artery disease involving native coronary artery of native heart with angina pectoris (HCC)   Hyperlipidemia   Cardiomyopathy, ischemic   Aortic stenosis   Gastrointestinal hemorrhage associated with anorectal source   CAD in native artery   Hypertriglyceridemia   Hypokalemia   Hypomagnesemia   Lower GI bleed -6/14 Transfuse 2 units PRBC - Patient being worked up by GI, they have ordered nuclear bleeding scan and if positive probable angiogram by interventional radiology. Will await recommendations -H/H QID -Patient continues to bleed overnight hemoglobin drop from 8.7 on 6/15 post transfusion to 6.4 AM 6/15.  -6/15 Transfuse 2 units PRBC Recent Labs Lab 03/07/16 0159 03/07/16 0822 03/07/16 1841 03/08/16 0019 03/08/16 0930  HGB 6.4* 7.9* 8.3* 8.1* 8.3*  -6/16 S/P Capsule endoscopy right colon bleed see results below. Per GI continue clear liquids for another 24 hours and if bleeding continues or future right colon bleeding consider angiogram with therapy or right hemicolectomy    ESRD on HD M/W/F -Patient At HD today  Cardiomyopathy, ischemic -Strict in and outSince admission -1.3 L -Daily weight  Filed Weights   03/08/16 0427 03/08/16 0926 03/08/16 1305  Weight: 60 kg (132 lb 4.4 oz) 63.3 kg  (139 lb 8.8 oz) 60.3 kg (132 lb 15 oz)  -Transfuse for hemoglobin<8  Aortic stenosis   HTN -Will allow permissive hypertension in order to facilitate HD and secondary to patient's ongoing GI bleed -Amlodipine 5 mg daily -Coreg 12.5 mg BID -Imdur 15 mg QHS  CAD native artery with angina/waxing and waning CP -Most likely secondary to patient's acute anemia--> demand ischemia -See hypertension -NTG PRN  -See cardiomyopathy -Serial Troponin elevated but stable. Most likely combination of renal failure + demand ischemia. Patient currently asymptomatic negative SOB/CP -Echocardiogram; compared to echocardiogram 08/04/2015 EF improved  Hyperglycemia -Sensitive SSI  HLD/Hypertriglyceridemia -Lipitor 80 mg daily -Lipid panel; not within AHA guidelines -Will Add Niaspan, when patient able to take aspirin to prevent side effects   Right BKA -Stable  Hypokalemia -Potassium goal> 4  Hypomagnesemia -Magnesium goal>2     DVT prophylaxis: SCD Code Status: Full Family Communication: None Disposition Plan: Per GI   Consultants:  Dr.Robert Schertz nephrology Dr.Marc Magod GI   Procedures/Significant Events:  6/12 transfused 2 units PRBC 6/13 transfused 2 units PRBC 6/13 colonoscopy;ascending colon polyps x8 , diverticulosis in ascending colon and descending colon and sigmoid colon, cecal polyp x1, blood in ileum, rectal polyp x1 6/13 EGD;hiatal hernia, schatzki's ring, mild gastritis 6/14 NM RBC scan; no clear-cut bleeding source. 6/14 Transfuse 2 units PRBC 6/15 Transfuse 2 units PRBC 6/16 echocardiogram;- Left ventricle: posterior lateral hypokinesis.- moderately dilated. -LVEF=45% to 50%.  - Aortic valve:  moderate stenosis. - Left atrium:  moderately dilated. 6/16 Capsule Endoscopy; Almost certainly right colon bleeding from diverticuli  Cultures NA  Antimicrobials: NA   Devices NA   LINES / TUBES:  NA    Continuous Infusions:    Subjective: 6/15  A/O 4, NAD. Negative abdominal pain, negative CP, negative SOB. States just began to have bright red blood per rectum over the weekend.    Objective: Filed Vitals:   03/08/16 1200 03/08/16 1230 03/08/16 1305 03/08/16 1308  BP: 136/62 109/50 113/77 140/73  Pulse: 71 80 89 72  Temp:   98 F (36.7 C)   TempSrc:      Resp:   15   Height:      Weight:   60.3 kg (132 lb 15 oz)   SpO2:        Intake/Output Summary (Last 24 hours) at 03/08/16 1436 Last data filed at 03/08/16 1308  Gross per 24 hour  Intake    320 ml  Output   3000 ml  Net  -2680 ml   Filed Weights   03/08/16 0427 03/08/16 0926 03/08/16 1305  Weight: 60 kg (132 lb 4.4 oz) 63.3 kg (139 lb 8.8 oz) 60.3 kg (132 lb 15 oz)    Examination:  General: A/O 4, NAD, No acute respiratory distress Eyes: negative scleral hemorrhage, negative anisocoria, negative icterus ENT: Negative Runny nose, negative gingival bleeding, Neck:  Negative scars, masses, torticollis, lymphadenopathy, JVD Lungs: Clear to auscultation bilaterally without wheezes or crackles Cardiovascular: Regular rate and rhythm without murmur gallop or rub normal S1 and S2 Abdomen: negative abdominal pain, nondistended, positive soft, bowel sounds, no rebound, no ascites, no appreciable mass Extremities: No significant cyanosis. Right BKA stable, LLE +1 pedal edema Skin: Negative rashes, lesions, ulcers Psychiatric:  Negative depression, negative anxiety, negative fatigue, negative mania  Central nervous system:  Cranial nerves II through XII intact, tongue/uvula midline, all extremities muscle strength 5/5, sensation intact throughout, finger nose finger bilateral within normal limits, quick finger touch bilateral within normal limits, negative dysarthria, negative expressive aphasia, negative receptive aphasia.  .     Data Reviewed: Care during the described time interval was provided by me .  I have reviewed this patient's available data, including  medical history, events of note, physical examination, and all test results as part of my evaluation. I have personally reviewed and interpreted all radiology studies.  CBC:  Recent Labs Lab 03/03/16 2002  03/04/16 0441  03/06/16 1252 03/06/16 1535  03/07/16 0159 03/07/16 0822 03/07/16 1841 03/08/16 0019 03/08/16 0930  WBC 6.1  --  5.2  --  5.3 4.2  --   --   --   --   --   --   NEUTROABS 3.9  --   --   --  3.6 2.9  --   --   --   --   --   --   HGB 9.4*  < > 6.9*  < > 6.5* 5.8*  < > 6.4* 7.9* 8.3* 8.1* 8.3*  HCT 30.9*  < > 22.8*  < > 20.0* 18.0*  < > 19.8* 24.2* 25.4* 24.1* 25.1*  MCV 92.0  --  91.2  --  84.4 84.9  --   --   --   --   --   --   PLT 191  --  165  --  128* 115*  --   --   --   --   --   --   < > = values in this interval not displayed. Basic Metabolic Panel:  Recent Labs Lab 03/04/16 0441  03/05/16 0407 03/06/16 1851 03/07/16 0159 03/08/16 0019  NA 138 134* 131* 135 131*  K 4.5 4.3 4.3 3.4* 4.0  CL 101 95* 101 100* 98*  CO2 26 29 19* 30 25  GLUCOSE 141* 95 145* 136* 126*  BUN 83* 42* 57* 16 37*  CREATININE 9.31* 6.10* 8.40* 3.40* 5.22*  CALCIUM 8.3* 8.7* 7.8* 7.8* 7.9*  MG  --   --   --  1.7 2.2  PHOS  --  5.5* 8.0*  --   --    GFR: Estimated Creatinine Clearance: 7.7 mL/min (by C-G formula based on Cr of 5.22). Liver Function Tests:  Recent Labs Lab 03/04/16 0441 03/05/16 0407 03/06/16 1851  AST 12*  --   --   ALT 8*  --   --   ALKPHOS 53  --   --   BILITOT 0.5  --   --   PROT 5.1*  --   --   ALBUMIN 2.4* 2.5* 2.0*   No results for input(s): LIPASE, AMYLASE in the last 168 hours. No results for input(s): AMMONIA in the last 168 hours. Coagulation Profile: No results for input(s): INR, PROTIME in the last 168 hours. Cardiac Enzymes:  Recent Labs Lab 03/04/16 0441 03/04/16 1100 03/07/16 1841 03/08/16 0019 03/08/16 0930  TROPONINI 0.10* 0.11* 0.44* 0.49* 0.49*   BNP (last 3 results) No results for input(s): PROBNP in the last 8760  hours. HbA1C: No results for input(s): HGBA1C in the last 72 hours. CBG:  Recent Labs Lab 03/07/16 1934 03/07/16 2327 03/07/16 2353 03/08/16 0358 03/08/16 0743  GLUCAP 112* 62* 114* 103* 94   Lipid Profile:  Recent Labs  03/07/16 0159  CHOL 60  HDL 23*  LDLCALC 22  TRIG 73  CHOLHDL 2.6   Thyroid Function Tests: No results for input(s): TSH, T4TOTAL, FREET4, T3FREE, THYROIDAB in the last 72 hours. Anemia Panel: No results for input(s): VITAMINB12, FOLATE, FERRITIN, TIBC, IRON, RETICCTPCT in the last 72 hours. Urine analysis:    Component Value Date/Time   COLORURINE YELLOW 06/22/2014 [facility] 06/22/2014 0531   LABSPEC 1.015 06/22/2014 0531   PHURINE 6.0 06/22/2014 0531   GLUCOSEU 100* 06/22/2014 0531   HGBUR SMALL* 06/22/2014 0531   HGBUR trace-lysed 06/03/2008 0855   BILIRUBINUR NEGATIVE 06/22/2014 0531   KETONESUR TRACE* 06/22/2014 0531   PROTEINUR 100* 06/22/2014 0531   UROBILINOGEN 0.2 06/22/2014 0531   NITRITE NEGATIVE 06/22/2014 0531   LEUKOCYTESUR NEGATIVE 06/22/2014 0531   Sepsis Labs: @LABRCNTIP (procalcitonin:4,lacticidven:4)  ) Recent Results (from the past 240 hour(s))  MRSA PCR Screening     Status: None   Collection Time: 03/03/16 10:09 PM  Result Value Ref Range Status   MRSA by PCR NEGATIVE NEGATIVE Final    Comment:        The GeneXpert MRSA Assay (FDA approved for NASAL specimens only), is one component of a comprehensive MRSA colonization surveillance program. It is not intended to diagnose MRSA infection nor to guide or monitor treatment for MRSA infections.          Radiology Studies: No results found.      Scheduled Meds: . amLODipine  5 mg Oral Daily  . atorvastatin  80 mg Oral q1800  . brinzolamide  1 drop Both Eyes BID   And  . brimonidine  1 drop Both Eyes BID  . calcitRIOL  1.5 mcg Oral Q M,W,F-HD  . carvedilol  12.5 mg Oral BID WC  . [START ON 03/15/2016] darbepoetin (ARANESP)  injection -  DIALYSIS  100 mcg Intravenous Q Fri-HD  . feeding supplement  1 Container Oral TID WC & HS  . feeding supplement (PRO-STAT SUGAR FREE 64)  30 mL Oral BID  . insulin aspart  0-9 Units Subcutaneous Q4H  . isosorbide mononitrate  15 mg Oral QHS  . latanoprost  1 drop Both Eyes QHS  . multivitamin  1 tablet Oral Daily  . pantoprazole  40 mg Oral QAC breakfast  . sevelamer carbonate  800 mg Oral With snacks  . sodium chloride flush  3 mL Intravenous Q12H   Continuous Infusions:    LOS: 5 days    Time spent: 40 minutes    Aibhlinn Kalmar, Geraldo Docker, MD [facility] Pager [phone]   If 7PM-7AM, please contact night-coverage [email or web address] Password [facility] 03/08/2016, 2:36 PM

## 2016-03-08 NOTE — Progress Notes (Signed)
Randy [name] 9:56 AM  Subjective: Patient without any new complaints and has not had a bowel movement today and believes the bleeding has slowed down or stopped and we discussed his capsule endoscopy  Objective: Vital signs stable afebrile no acute distress abdomen is soft nontender hemoglobin stable despite multiple transfusions  Assessment: Almost certainly right colon bleeding from diverticuli  Plan: We can continue clear liquids for one more day and if bleeding continues or future right colon bleeding consider angiogram with therapy or right hemicolectomy for repeat colonoscopy and I would reevaluate aspirin and blood thinners needs going forward and try to minimize as much as possible and will last my partner to check on tomorrow and if no signs of bleeding probably advance diet then  [facility]  Pager [phone] After 5PM or if no answer call [phone]

## 2016-03-08 NOTE — Progress Notes (Signed)
Dialysis treatment completed.  3500 mL ultrafiltrated and net fluid removal 3000 mL.    Patient status unchanged. Lung sounds diminished to ausculation in all fields. Generalized edema. Cardiac: 1st degree AV block.  Disconnected lines and removed needles.  Pressure held for 10 minutes and band aid/gauze dressing applied.  Report given to bedside RN, Everlene Other.

## 2016-03-08 NOTE — Progress Notes (Signed)
Primary cardiologist: Dr. Carlyle Dolly  Seen for followup: CAD, abnormal troponin I  Subjective:    82 y.o. male, with ESRD on HD (M, W, F) , Hypertension, Anemia, apparently c/o bleeding per rectum   Was found to have minimal troponin elevations - we were asked to comment .   He has angina several times a month - takes SL NTG for relief.   No chest pain this morning. Feels dizzy however with recent hypotension. Reports having recurrent stools with GI bleeding (Gastroenterology service aware).  Hb continues to fall.   Objective:   Temp:  [97.7 F (36.5 C)-98.5 F (36.9 C)] 98.2 F (36.8 C) (06/16 0400) Pulse Rate:  [73-157] 76 (06/16 0400) Resp:  [13-23] 15 (06/16 0400) BP: (96-158)/(53-87) 120/59 mmHg (06/16 0400) SpO2:  [99 %-100 %] 100 % (06/16 0400) Weight:  [132 lb (59.875 kg)-132 lb 4.4 oz (60 kg)] 132 lb 4.4 oz (60 kg) (06/16 0427) Last BM Date: 03/08/16  Filed Weights   03/07/16 0344 03/07/16 0931 03/08/16 0427  Weight: 132 lb 11.2 oz (60.192 kg) 132 lb (59.875 kg) 132 lb 4.4 oz (60 kg)    Intake/Output Summary (Last 24 hours) at 03/08/16 0834 Last data filed at 03/07/16 1700  Gross per 24 hour  Intake    323 ml  Output      0 ml  Net    323 ml    Telemetry: Sinus rhythm with blocked PACs.  Exam:  General: Chronically ill-appearing male, no acute distress.  Lungs: Decreased breath sounds without crackles.  Cardiac: RRR with ectopy and 3/6 systolic murmur.  Abdomen: No rebound, bowel sounds present.  Extremities: Status post right BKA and left AKA. Left arm AV fistula.  Lab Results:  Basic Metabolic Panel:  Recent Labs Lab 03/06/16 1851 03/07/16 0159 03/08/16 0019  NA 131* 135 131*  K 4.3 3.4* 4.0  CL 101 100* 98*  CO2 19* 30 25  GLUCOSE 145* 136* 126*  BUN 57* 16 37*  CREATININE 8.40* 3.40* 5.22*  CALCIUM 7.8* 7.8* 7.9*  MG  --  1.7 2.2    Liver Function Tests:  Recent Labs Lab 03/04/16 0441 03/05/16 0407 03/06/16 1851    AST 12*  --   --   ALT 8*  --   --   ALKPHOS 53  --   --   BILITOT 0.5  --   --   PROT 5.1*  --   --   ALBUMIN 2.4* 2.5* 2.0*    CBC:  Recent Labs Lab 03/04/16 0441  03/06/16 1252 03/06/16 1535  03/07/16 0822 03/07/16 1841 03/08/16 0019  WBC 5.2  --  5.3 4.2  --   --   --   --   HGB 6.9*  < > 6.5* 5.8*  < > 7.9* 8.3* 8.1*  HCT 22.8*  < > 20.0* 18.0*  < > 24.2* 25.4* 24.1*  MCV 91.2  --  84.4 84.9  --   --   --   --   PLT 165  --  128* 115*  --   --   --   --   < > = values in this interval not displayed.  Cardiac Enzymes:  Recent Labs Lab 03/04/16 1100 03/07/16 1841 03/08/16 0019  TROPONINI 0.11* 0.44* 0.49*   Echocardiogram 08/04/2015: Study Conclusions  - Left ventricle: The cavity size was normal. Wall thickness was  increased increased in a pattern of mild to moderate LVH.  Incidentally noted transverse  false tendon in LV. Systolic  function was moderately to severely reduced. The estimated  ejection fraction was in the range of 30% to 35%. Diffuse  hypokinesis. There is severe hypokinesis of the  basal-midinferolateral and inferior myocardium. Doppler  parameters are consistent with restrictive physiology, indicative  of decreased left ventricular diastolic compliance and/or  increased left atrial pressure. - Aortic valve: Moderately calcified annulus. Trileaflet; mildly  calcified leaflets. Cusp separation was reduced. There was  moderate stenosis. There was trivial regurgitation. Mean gradient  (S): 16 mm Hg. VTI ratio of LVOT to aortic valve: 0.4. Valve area  (VTI): 1.24 cm^2. Valve area (Vmax): 1.21 cm^2. - Mitral valve: Calcified annulus. There was trivial regurgitation. - Left atrium: The atrium was moderately dilated. - Right atrium: The atrium was moderately to severely dilated.  Central venous pressure (est): 3 mm Hg. - Atrial septum: No defect or patent foramen ovale was identified. - Tricuspid valve: There was mild  regurgitation. - Pulmonary arteries: PA peak pressure: 38 mm Hg (S). - Pericardium, extracardiac: There was no pericardial effusion.  Impressions:  - Mild to moderate LVH with LVEF approximately 30%, diffuse  hypokinesis, most severe in the mid to basal inferior and  inferolateral wall. Diastolic filling pattern consistent with  restrictive physiology. Moderate left atrial enlargement.  Moderate calcific aortic stenosis with trivial aortic  regurgitation. Mild tricuspid regurgitation with PASP 38 mmHg.  Moderate to severe right atrial enlargement. Compared to the  previous study from December 2015, there has been further  reduction in LVEF, and progression in degree of aortic stenosis.  Cardiac catheterization 08/17/2014: Coronary dominance: right  Left mainstem: Short, no significant disease.   Left anterior descending (LAD): There was a moderate D1 that branched early with 40% ostial stenosis. This was followed by a large septal perforator. There was 40-50% proximal LAD stenosis at the takeoff of the septal perforator. 30% mid LAD stenosis at D2.   Left circumflex (LCx): There was a large ramus that branched early into a superior and and inferior division, both vessels were moderate in size. Both divisions of the ramus had relatively up to 80% proximal stenosis. The AV LCx itself had luminal irregularities.   Right coronary artery (RCA): There was an early large acute marginal with 40% proximal and 40% mid vessel stenosis. The RCA had diffuse luminal irregularities and up to 40% mid vessel stenosis.   Medications:   Scheduled Medications: . amLODipine  5 mg Oral Daily  . atorvastatin  80 mg Oral q1800  . brinzolamide  1 drop Both Eyes BID   And  . brimonidine  1 drop Both Eyes BID  . calcitRIOL  1.5 mcg Oral Q M,W,F-HD  . carvedilol  12.5 mg Oral BID WC  . [START ON 03/15/2016] darbepoetin (ARANESP) injection - DIALYSIS  100 mcg Intravenous Q Fri-HD  . feeding  supplement  1 Container Oral TID WC & HS  . feeding supplement (PRO-STAT SUGAR FREE 64)  30 mL Oral BID  . insulin aspart  0-9 Units Subcutaneous Q4H  . isosorbide mononitrate  15 mg Oral QHS  . latanoprost  1 drop Both Eyes QHS  . multivitamin  1 tablet Oral Daily  . pantoprazole  40 mg Oral QAC breakfast  . sevelamer carbonate  800 mg Oral With snacks  . sodium chloride flush  3 mL Intravenous Q12H    PRN Medications: acetaminophen **OR** acetaminophen, gabapentin, nitroGLYCERIN, oxyCODONE, polyvinyl alcohol   Assessment:   1. Mildly abnormal troponin I consistent with  demand ischemia in light of comorbid illnesses and physiologic stressors. Has knows moderate CAD . Takes NTG regularly. Troponin levels were minimally elevated.   No additional cardiac work up at this point -     2. Moderate multivessel CAD by cardiac catheterization in 2015 that has been managed medically. He has regular recurring angina. Outpatient medical regimen includes aspirin, Coreg, lisinopril, Norvasc, and Imdur. Continue medical therapy  No plans for cath or other ischemic evaluation  at this time - he is still actively bleeding    3. Hyperlipidemia, on Lipitor.  4. Aortic stenosis:  Repeat echo is being done.   5. Ischemic cardiomyopathy with LVEF approximately 30%.  6. Moderate calcific aortic stenosis.  7. ESRD on hemodialysis.  8. Recurrent GI bleed   Gastroenterology is managing, colonoscopy deferred so far with plan for possible tagged RBC scan. He is getting repeat PRBC transfusion and fluid bolus. Hb continues to fall  He has AS,  May have AVMs .    Mertie Moores, MD  03/08/2016 8:34 AM    [facility],  [place], [place]  [id number] Pager [phone] Phone: [phone]; Fax: [phone]

## 2016-03-08 NOTE — Progress Notes (Signed)
  Echocardiogram 2D Echocardiogram has been performed.  Randy [name] 03/08/2016, 9:11 AM

## 2016-03-09 LAB — BASIC METABOLIC PANEL
ANION GAP: 6 (ref 5–15)
BUN: 26 mg/dL — ABNORMAL HIGH (ref 6–20)
CHLORIDE: 99 mmol/L — AB (ref 101–111)
CO2: 28 mmol/L (ref 22–32)
Calcium: 8.3 mg/dL — ABNORMAL LOW (ref 8.9–10.3)
Creatinine, Ser: 4.46 mg/dL — ABNORMAL HIGH (ref 0.61–1.24)
GFR calc Af Amer: 13 mL/min — ABNORMAL LOW (ref >60)
GFR, EST NON AFRICAN AMERICAN: 11 mL/min — AB (ref >60)
GLUCOSE: 102 mg/dL — AB (ref 65–99)
POTASSIUM: 4.5 mmol/L (ref 3.5–5.1)
Sodium: 133 mmol/L — ABNORMAL LOW (ref 135–145)

## 2016-03-09 LAB — HEMOGLOBIN AND HEMATOCRIT, BLOOD
HCT: 26.2 % — ABNORMAL LOW (ref 39.0–52.0)
HCT: 28.5 % — ABNORMAL LOW (ref 39.0–52.0)
HEMATOCRIT: 24.4 % — AB (ref 39.0–52.0)
HEMOGLOBIN: 8.4 g/dL — AB (ref 13.0–17.0)
Hemoglobin: 7.8 g/dL — ABNORMAL LOW (ref 13.0–17.0)
Hemoglobin: 9 g/dL — ABNORMAL LOW (ref 13.0–17.0)

## 2016-03-09 LAB — GLUCOSE, CAPILLARY
GLUCOSE-CAPILLARY: 107 mg/dL — AB (ref 65–99)
GLUCOSE-CAPILLARY: 109 mg/dL — AB (ref 65–99)
GLUCOSE-CAPILLARY: 129 mg/dL — AB (ref 65–99)
GLUCOSE-CAPILLARY: 141 mg/dL — AB (ref 65–99)
Glucose-Capillary: 98 mg/dL (ref 65–99)
Glucose-Capillary: 83 mg/dL (ref 65–99)

## 2016-03-09 LAB — TYPE AND SCREEN
ABO/RH(D): O POS
ANTIBODY SCREEN: NEGATIVE

## 2016-03-09 LAB — PREPARE RBC (CROSSMATCH)

## 2016-03-09 LAB — MAGNESIUM: Magnesium: 1.9 mg/dL (ref 1.7–2.4)

## 2016-03-09 MED ORDER — CARVEDILOL 6.25 MG PO TABS
6.2500 mg | ORAL_TABLET | Freq: Two times a day (BID) | ORAL | Status: DC
  Administered 2016-03-11 – 2016-03-13 (×4): 6.25 mg via ORAL
  Filled 2016-03-09 (×5): qty 1

## 2016-03-09 MED ORDER — SODIUM CHLORIDE 0.9 % IV SOLN
Freq: Once | INTRAVENOUS | Status: AC
  Administered 2016-03-09: 13:00:00 via INTRAVENOUS

## 2016-03-09 MED ORDER — CARVEDILOL 12.5 MG PO TABS: 12.5000 mg | ORAL_TABLET | Freq: Two times a day (BID) | ORAL | Status: DC

## 2016-03-09 NOTE — Progress Notes (Signed)
[facility] Progress Note   Subjective: no c/o.  S/p 8 units prbc overall this admission.  RN says capsule endo showed some bleeding in the R colon.   Filed Vitals:   03/09/16 0000 03/09/16 0355 03/09/16 0800 03/09/16 0955  BP: 101/57 111/59  111/54  Pulse: 67 67  63  Temp:  98.2 F (36.8 C) 97.2 F (36.2 C)   TempSrc:  Oral Oral   Resp: 20 15    Height:      Weight:      SpO2: 100% 98%      Inpatient medications: . sodium chloride   Intravenous Once  . amLODipine  5 mg Oral Daily  . atorvastatin  80 mg Oral q1800  . brinzolamide  1 drop Both Eyes BID   And  . brimonidine  1 drop Both Eyes BID  . calcitRIOL  1.5 mcg Oral Q M,W,F-HD  . carvedilol  12.5 mg Oral BID WC  . [START ON 03/15/2016] darbepoetin (ARANESP) injection - DIALYSIS  100 mcg Intravenous Q Fri-HD  . feeding supplement  1 Container Oral TID WC & HS  . feeding supplement (PRO-STAT SUGAR FREE 64)  30 mL Oral BID  . insulin aspart  0-9 Units Subcutaneous Q4H  . isosorbide mononitrate  15 mg Oral QHS  . latanoprost  1 drop Both Eyes QHS  . multivitamin  1 tablet Oral Daily  . pantoprazole  40 mg Oral QAC breakfast  . sevelamer carbonate  800 mg Oral With snacks  . sodium chloride flush  3 mL Intravenous Q12H     acetaminophen **OR** acetaminophen, gabapentin, nitroGLYCERIN, oxyCODONE, polyvinyl alcohol  Exam: Alert, no distress, calm No jvd Chest clear bilat RRR 2/6 sem, no RG ABd soft ntnd Rectal tube draining bloody liquid  L BKA/ R AKA, no LE edema LFA AVF +Bruit Ox 3, nf  Dialysis: [facility] MWF  3.5h  56.5kg  2/2.25 bath  P4  Hep none  LFA AVF Mircera 150 mcg IV q 2 weeks (last dose 02/21/16 100 mcg IV HGB 10.2 03/09/16) Calcitriol 1.75 mcg IV q MWF (Last PTH 651 02/07/16)      Assessment: 1  Lower GI bleed - sp 8 units prbc, sp capsule endo, results pend 2  Anemia Hb 8's, getting darbe 100/ wk here (last 6/15) 3  HTN on about 1/2 of his home meds , low dose coreg/ norvasc,  stable 4  Vol^ improved, still up 3kg from dry wt 5  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 6  PAD bilat amputee 7  Hx CVA 8  CM EF 30%/ aortic stenosis 9  Hx known CAD w chron stable angina on med Rx  Plan - HD Monday   Kelly Splinter MD [place] pager [phone]    cell [phone] 03/09/2016, 10:40 AM    Recent Labs Lab 03/05/16 0407 03/06/16 1851  03/08/16 0019 03/08/16 1515 03/09/16 0600  NA 134* 131*  < > 131* 135 133*  K 4.3 4.3  < > 4.0 3.9 4.5  CL 95* 101  < > 98* 101 99*  CO2 29 19*  < > 25 28 28   GLUCOSE 95 145*  < > 126* 196* 102*  BUN 42* 57*  < > 37* 8 26*  CREATININE 6.10* 8.40*  < > 5.22* 2.60* 4.46*  CALCIUM 8.7* 7.8*  < > 7.9* 8.0* 8.3*  PHOS 5.5* 8.0*  --   --  2.4*  --   < > = values in this interval  not displayed.  Recent Labs Lab 03/04/16 0441 03/05/16 0407 03/06/16 1851 03/08/16 1515  AST 12*  --   --   --   ALT 8*  --   --   --   ALKPHOS 53  --   --   --   BILITOT 0.5  --   --   --   PROT 5.1*  --   --   --   ALBUMIN 2.4* 2.5* 2.0* 2.3*    Recent Labs Lab 03/03/16 2002  03/06/16 1252 03/06/16 1535  03/08/16 1515 03/08/16 1828 03/09/16 0015 03/09/16 0600  WBC 6.1  < > 5.3 4.2  --  4.8  --   --   --   NEUTROABS 3.9  --  3.6 2.9  --   --   --   --   --   HGB 9.4*  < > 6.5* 5.8*  < > 9.6* 8.7* 8.4* 7.8*  HCT 30.9*  < > 20.0* 18.0*  < > 29.6* 27.2* 26.2* 24.4*  MCV 92.0  < > 84.4 84.9  --  86.8  --   --   --   PLT 191  < > 128* 115*  --  137*  --   --   --   < > = values in this interval not displayed. Iron/TIBC/Ferritin/ %Sat    Component Value Date/Time   IRON 24* 10/25/2015 1628   TIBC 113* 10/25/2015 1628   FERRITIN 1242* 10/25/2015 1628   IRONPCTSAT 21 10/25/2015 1628

## 2016-03-09 NOTE — Progress Notes (Signed)
Pt has not had any bloody stools today - passing flatus. Had 1 UPC today advanced to full liquid for dinner

## 2016-03-09 NOTE — Progress Notes (Signed)
PROGRESS NOTE    Randy [name]  [id number] DOB: [birth year] DOA: 03/03/2016 PCP: [name], MD   Brief Narrative:  82 y.o. BM PMHx CVA, ESRD on HD (M, W, F) , HTN,Nonischemic cardiomyopathy LVEF 35-40% , Myocarditis, PVD, CAD multiple native artery, HLD, Hypertension, Anemia, S/P Right BKA  Apparently c/o bleeding per rectum starting this afternoon. Pt states that there was blood on the toilet paper and the toilet bowel. +n/v x1, No blood. Denies fever, chills, abd pain, diarrhea, black stool.    Assessment & Plan:   Principal Problem:   GI bleeding Active Problems:   ESRD on dialysis [facility])   Essential hypertension   GI bleed   Hyperglycemia   Coronary artery disease involving native coronary artery of native heart with angina pectoris (HCC)   Hyperlipidemia   Cardiomyopathy, ischemic   Aortic stenosis   Gastrointestinal hemorrhage associated with anorectal source   CAD in native artery   Hypertriglyceridemia   Hypokalemia   Hypomagnesemia   Lower GI bleed -6/14 Transfuse 2 units PRBC - Patient being worked up by GI, they have ordered nuclear bleeding scan and if positive probable angiogram by interventional radiology. Will await recommendations -H/H QID -Patient continues to bleed overnight hemoglobin drop from 8.7 on 6/15 post transfusion to 6.4 AM 6/15.  -6/15 Transfuse 2 units PRBC -6/17 transfuse 1 unit PRBC Recent Labs Lab 03/08/16 0930 03/08/16 1515 03/08/16 1828 03/09/16 0015 03/09/16 0600  HGB 8.3* 9.6* 8.7* 8.4* 7.8*  -6/16 S/P Capsule endoscopy right colon bleed see results below. Per GI continue clear liquids for another 24 hours and if bleeding continues or future right colon bleeding consider angiogram with therapy or right hemicolectomy    ESRD on HD M/W/F -Per nephrology  Cardiomyopathy, ischemic -Strict in and outSince admission -1.2 L -Daily weight  Filed Weights   03/08/16 0427 03/08/16 0926 03/08/16 1305  Weight: 60 kg  (132 lb 4.4 oz) 63.3 kg (139 lb 8.8 oz) 60.3 kg (132 lb 15 oz)  -Transfuse for hemoglobin<8  Aortic stenosis   HTN -Will allow permissive hypertension in order to facilitate HD and secondary to patient's ongoing GI bleed -Amlodipine 5 mg daily -Decrease Coreg 6.25 mg BID; patient's BP on the soft side. -Imdur 15 mg QHS  CAD native artery with angina/waxing and waning CP -Most likely secondary to patient's acute anemia--> demand ischemia -See hypertension -NTG PRN  -See cardiomyopathy -Serial Troponin elevated but stable. Most likely combination of renal failure + demand ischemia. Patient currently asymptomatic negative SOB/CP -Echocardiogram; compared to echocardiogram 08/04/2015 EF improved  Hyperglycemia -Sensitive SSI  HLD/Hypertriglyceridemia -Lipitor 80 mg daily -Lipid panel; not within AHA guidelines -Will Add Niaspan, when patient able to take aspirin to prevent side effects   Right BKA -Stable  Hypokalemia -Potassium goal> 4  Hypomagnesemia -Magnesium goal>2     DVT prophylaxis: SCD Code Status: Full Family Communication: None Disposition Plan: Per GI   Consultants:  Dr.Robert Schertz nephrology Dr.Marc Magod GI   Procedures/Significant Events:  6/12 transfused 2 units PRBC 6/13 transfused 2 units PRBC 6/13 colonoscopy;ascending colon polyps x8 , diverticulosis in ascending colon and descending colon and sigmoid colon, cecal polyp x1, blood in ileum, rectal polyp x1 6/13 EGD;hiatal hernia, schatzki's ring, mild gastritis 6/14 NM RBC scan; no clear-cut bleeding source. 6/14 Transfuse 2 units PRBC 6/15 Transfuse 2 units PRBC 6/16 echocardiogram;- Left ventricle: posterior lateral hypokinesis.- moderately dilated. -LVEF=45% to 50%.  - Aortic valve:  moderate stenosis. - Left atrium:  moderately dilated. 6/16  Capsule Endoscopy; Almost certainly right colon bleeding from diverticuli 6/17 transfuse 1 unit  PRBC  Cultures NA  Antimicrobials: NA   Devices NA   LINES / TUBES:  NA    Continuous Infusions:    Subjective: 6/16 A/O 4, NAD. Negative abdominal pain, negative CP, negative SOB. Continued bright red blood per rectum however slowed.    Objective: Filed Vitals:   03/08/16 2340 03/09/16 0000 03/09/16 0355 03/09/16 0800  BP: 94/57 101/57 111/59   Pulse: 74 67 67   Temp:   98.2 F (36.8 C) 97.2 F (36.2 C)  TempSrc:   Oral Oral  Resp: 19 20 15    Height:      Weight:      SpO2: 100% 100% 98%     Intake/Output Summary (Last 24 hours) at 03/09/16 0920 Last data filed at 03/09/16 [id number]  Gross per 24 hour  Intake      0 ml  Output   3500 ml  Net  -3500 ml   Filed Weights   03/08/16 0427 03/08/16 0926 03/08/16 1305  Weight: 60 kg (132 lb 4.4 oz) 63.3 kg (139 lb 8.8 oz) 60.3 kg (132 lb 15 oz)    Examination:  General: A/O 4, NAD, No acute respiratory distress Eyes: negative scleral hemorrhage, negative anisocoria, negative icterus ENT: Negative Runny nose, negative gingival bleeding, Neck:  Negative scars, masses, torticollis, lymphadenopathy, JVD Lungs: Clear to auscultation bilaterally without wheezes or crackles Cardiovascular: Regular rate and rhythm without murmur gallop or rub normal S1 and S2 Abdomen: negative abdominal pain, nondistended, positive soft, bowel sounds, no rebound, no ascites, no appreciable mass Extremities: No significant cyanosis. Right BKA stable, LLE +1 pedal edema Skin: Negative rashes, lesions, ulcers Psychiatric:  Negative depression, negative anxiety, negative fatigue, negative mania  Central nervous system:  Cranial nerves II through XII intact, tongue/uvula midline, all extremities muscle strength 5/5, sensation intact throughout, finger nose finger bilateral within normal limits, quick finger touch bilateral within normal limits, negative dysarthria, negative expressive aphasia, negative receptive aphasia.  .     Data  Reviewed: Care during the described time interval was provided by me .  I have reviewed this patient's available data, including medical history, events of note, physical examination, and all test results as part of my evaluation. I have personally reviewed and interpreted all radiology studies.  CBC:  Recent Labs Lab 03/03/16 2002  03/04/16 0441  03/06/16 1252 03/06/16 1535  03/08/16 0930 03/08/16 1515 03/08/16 1828 03/09/16 0015 03/09/16 0600  WBC 6.1  --  5.2  --  5.3 4.2  --   --  4.8  --   --   --   NEUTROABS 3.9  --   --   --  3.6 2.9  --   --   --   --   --   --   HGB 9.4*  < > 6.9*  < > 6.5* 5.8*  < > 8.3* 9.6* 8.7* 8.4* 7.8*  HCT 30.9*  < > 22.8*  < > 20.0* 18.0*  < > 25.1* 29.6* 27.2* 26.2* 24.4*  MCV 92.0  --  91.2  --  84.4 84.9  --   --  86.8  --   --   --   PLT 191  --  165  --  128* 115*  --   --  137*  --   --   --   < > = values in this interval not displayed. Basic Metabolic Panel:  Recent  Labs Lab 03/05/16 0407 03/06/16 1851 03/07/16 0159 03/08/16 0019 03/08/16 1515 03/09/16 0600  NA 134* 131* 135 131* 135 133*  K 4.3 4.3 3.4* 4.0 3.9 4.5  CL 95* 101 100* 98* 101 99*  CO2 29 19* 30 25 28 28   GLUCOSE 95 145* 136* 126* 196* 102*  BUN 42* 57* 16 37* 8 26*  CREATININE 6.10* 8.40* 3.40* 5.22* 2.60* 4.46*  CALCIUM 8.7* 7.8* 7.8* 7.9* 8.0* 8.3*  MG  --   --  1.7 2.2  --  1.9  PHOS 5.5* 8.0*  --   --  2.4*  --    GFR: Estimated Creatinine Clearance: 9 mL/min (by C-G formula based on Cr of 4.46). Liver Function Tests:  Recent Labs Lab 03/04/16 0441 03/05/16 0407 03/06/16 1851 03/08/16 1515  AST 12*  --   --   --   ALT 8*  --   --   --   ALKPHOS 53  --   --   --   BILITOT 0.5  --   --   --   PROT 5.1*  --   --   --   ALBUMIN 2.4* 2.5* 2.0* 2.3*   No results for input(s): LIPASE, AMYLASE in the last 168 hours. No results for input(s): AMMONIA in the last 168 hours. Coagulation Profile: No results for input(s): INR, PROTIME in the last 168  hours. Cardiac Enzymes:  Recent Labs Lab 03/04/16 0441 03/04/16 1100 03/07/16 1841 03/08/16 0019 03/08/16 0930  TROPONINI 0.10* 0.11* 0.44* 0.49* 0.49*   BNP (last 3 results) No results for input(s): PROBNP in the last 8760 hours. HbA1C: No results for input(s): HGBA1C in the last 72 hours. CBG:  Recent Labs Lab 03/08/16 1614 03/08/16 1924 03/08/16 2317 03/09/16 0356 03/09/16 0829  GLUCAP 194* 139* 107* 109* 98   Lipid Profile:  Recent Labs  03/07/16 0159  CHOL 60  HDL 23*  LDLCALC 22  TRIG 73  CHOLHDL 2.6   Thyroid Function Tests: No results for input(s): TSH, T4TOTAL, FREET4, T3FREE, THYROIDAB in the last 72 hours. Anemia Panel: No results for input(s): VITAMINB12, FOLATE, FERRITIN, TIBC, IRON, RETICCTPCT in the last 72 hours. Urine analysis:    Component Value Date/Time   COLORURINE YELLOW 06/22/2014 [place] 06/22/2014 0531   LABSPEC 1.015 06/22/2014 0531   PHURINE 6.0 06/22/2014 0531   GLUCOSEU 100* 06/22/2014 0531   HGBUR SMALL* 06/22/2014 0531   HGBUR trace-lysed 06/03/2008 0855   BILIRUBINUR NEGATIVE 06/22/2014 0531   KETONESUR TRACE* 06/22/2014 0531   PROTEINUR 100* 06/22/2014 0531   UROBILINOGEN 0.2 06/22/2014 0531   NITRITE NEGATIVE 06/22/2014 0531   LEUKOCYTESUR NEGATIVE 06/22/2014 0531   Sepsis Labs: @LABRCNTIP (procalcitonin:4,lacticidven:4)  ) Recent Results (from the past 240 hour(s))  MRSA PCR Screening     Status: None   Collection Time: 03/03/16 10:09 PM  Result Value Ref Range Status   MRSA by PCR NEGATIVE NEGATIVE Final    Comment:        The GeneXpert MRSA Assay (FDA approved for NASAL specimens only), is one component of a comprehensive MRSA colonization surveillance program. It is not intended to diagnose MRSA infection nor to guide or monitor treatment for MRSA infections.          Radiology Studies: No results found.      Scheduled Meds: . amLODipine  5 mg Oral Daily  . atorvastatin   80 mg Oral q1800  . brinzolamide  1 drop Both Eyes BID  And  . brimonidine  1 drop Both Eyes BID  . calcitRIOL  1.5 mcg Oral Q M,W,F-HD  . carvedilol  12.5 mg Oral BID WC  . [START ON 03/15/2016] darbepoetin (ARANESP) injection - DIALYSIS  100 mcg Intravenous Q Fri-HD  . feeding supplement  1 Container Oral TID WC & HS  . feeding supplement (PRO-STAT SUGAR FREE 64)  30 mL Oral BID  . insulin aspart  0-9 Units Subcutaneous Q4H  . isosorbide mononitrate  15 mg Oral QHS  . latanoprost  1 drop Both Eyes QHS  . multivitamin  1 tablet Oral Daily  . pantoprazole  40 mg Oral QAC breakfast  . sevelamer carbonate  800 mg Oral With snacks  . sodium chloride flush  3 mL Intravenous Q12H   Continuous Infusions:    LOS: 6 days    Time spent: 40 minutes    Yesica Kemler, Geraldo Docker, MD [facility] Pager [phone]   If 7PM-7AM, please contact night-coverage [email or web address] Password [email or web address] 03/09/2016, 9:20 AM

## 2016-03-09 NOTE — Progress Notes (Signed)
Cardiology to see as needed over the weekend  Please call with questions  Thompson Grayer MD, [facility] 03/09/2016 10:33 AM

## 2016-03-10 LAB — BASIC METABOLIC PANEL
Anion gap: 10 (ref 5–15)
BUN: 45 mg/dL — AB (ref 6–20)
CALCIUM: 8.4 mg/dL — AB (ref 8.9–10.3)
CO2: 26 mmol/L (ref 22–32)
CREATININE: 6.03 mg/dL — AB (ref 0.61–1.24)
Chloride: 96 mmol/L — ABNORMAL LOW (ref 101–111)
GFR calc non Af Amer: 8 mL/min — ABNORMAL LOW (ref >60)
GFR, EST AFRICAN AMERICAN: 9 mL/min — AB (ref >60)
Glucose, Bld: 89 mg/dL (ref 65–99)
Potassium: 5.3 mmol/L — ABNORMAL HIGH (ref 3.5–5.1)
SODIUM: 132 mmol/L — AB (ref 135–145)

## 2016-03-10 LAB — TYPE AND SCREEN
ABO/RH(D): O POS
ANTIBODY SCREEN: NEGATIVE
UNIT DIVISION: 0
UNIT DIVISION: 0
Unit division: 0
Unit division: 0
Unit division: 0

## 2016-03-10 LAB — GLUCOSE, CAPILLARY
GLUCOSE-CAPILLARY: 102 mg/dL — AB (ref 65–99)
GLUCOSE-CAPILLARY: 111 mg/dL — AB (ref 65–99)
GLUCOSE-CAPILLARY: 113 mg/dL — AB (ref 65–99)
Glucose-Capillary: 113 mg/dL — ABNORMAL HIGH (ref 65–99)
Glucose-Capillary: 133 mg/dL — ABNORMAL HIGH (ref 65–99)
Glucose-Capillary: 175 mg/dL — ABNORMAL HIGH (ref 65–99)

## 2016-03-10 LAB — MAGNESIUM: Magnesium: 1.9 mg/dL (ref 1.7–2.4)

## 2016-03-10 LAB — HEMOGLOBIN AND HEMATOCRIT, BLOOD
HCT: 27.6 % — ABNORMAL LOW (ref 39.0–52.0)
HCT: 31.1 % — ABNORMAL LOW (ref 39.0–52.0)
HEMOGLOBIN: 8.7 g/dL — AB (ref 13.0–17.0)
Hemoglobin: 9.7 g/dL — ABNORMAL LOW (ref 13.0–17.0)

## 2016-03-10 LAB — CBC
HEMATOCRIT: 28.4 % — AB (ref 39.0–52.0)
HEMOGLOBIN: 9 g/dL — AB (ref 13.0–17.0)
MCH: 28.6 pg (ref 26.0–34.0)
MCHC: 31.7 g/dL (ref 30.0–36.0)
MCV: 90.2 fL (ref 78.0–100.0)
Platelets: 189 10*3/uL (ref 150–400)
RBC: 3.15 MIL/uL — ABNORMAL LOW (ref 4.22–5.81)
RDW: 17.5 % — ABNORMAL HIGH (ref 11.5–15.5)
WBC: 6.8 10*3/uL (ref 4.0–10.5)

## 2016-03-10 NOTE — Progress Notes (Signed)
PROGRESS NOTE    Randy [name]  [id number] DOB: [birth year] DOA: 03/03/2016 PCP: [name], MD   Brief Narrative:  82 y.o. BM PMHx CVA, ESRD on HD (M, W, F) , HTN,Nonischemic cardiomyopathy LVEF 35-40% , Myocarditis, PVD, CAD multiple native artery, HLD, Hypertension, Anemia, S/P Right BKA  Apparently c/o bleeding per rectum starting this afternoon. Pt states that there was blood on the toilet paper and the toilet bowel. +n/v x1, No blood. Denies fever, chills, abd pain, diarrhea, black stool.    Assessment & Plan:   Principal Problem:   GI bleeding Active Problems:   ESRD on dialysis [facility])   Essential hypertension   GI bleed   Hyperglycemia   Coronary artery disease involving native coronary artery of native heart with angina pectoris (HCC)   Hyperlipidemia   Cardiomyopathy, ischemic   Aortic stenosis   Gastrointestinal hemorrhage associated with anorectal source   CAD in native artery   Hypertriglyceridemia   Hypokalemia   Hypomagnesemia   Lower GI bleed -6/14 Transfuse 2 units PRBC - Patient being worked up by GI, they have ordered nuclear bleeding scan and if positive probable angiogram by interventional radiology. Will await recommendations -H/H QID -Patient continues to bleed overnight hemoglobin drop from 8.7 on 6/15 post transfusion to 6.4 AM 6/15.  -6/15 Transfuse 2 units PRBC -6/17 transfuse 1 unit PRBC  Recent Labs Lab 03/08/16 1515 03/08/16 1828 03/09/16 0015 03/09/16 0600 03/09/16 1623  HGB 9.6* 8.7* 8.4* 7.8* 9.0*  -6/16 S/P Capsule endoscopy right colon bleed see results below. Per GI continue clear liquids for another 24 hours and if bleeding continues or future right colon bleeding consider angiogram with therapy or right hemicolectomy  -6/18 appears GI bleed may be tapering off. GI has advance diet monitor closely. Continue to hold ANY blood thinning medication.    ESRD on HD M/W/F -Per nephrology  Cardiomyopathy,  ischemic -Strict in and outSince admission - 535.3 L -Daily weight  Filed Weights   03/08/16 0926 03/08/16 1305 03/10/16 0350  Weight: 63.3 kg (139 lb 8.8 oz) 60.3 kg (132 lb 15 oz) 62.506 kg (137 lb 12.8 oz)  -Transfuse for hemoglobin<8  Aortic stenosis   HTN -Will allow permissive hypertension in order to facilitate HD and secondary to patient's ongoing GI bleed -Amlodipine 5 mg daily -Coreg 6.25 mg BID; patient's BP on the soft side. -Imdur 15 mg QHS  CAD native artery with angina/waxing and waning CP -Most likely secondary to patient's acute anemia--> demand ischemia -See hypertension -NTG PRN  -See cardiomyopathy -Serial Troponin elevated but stable. Most likely combination of renal failure + demand ischemia. Patient currently asymptomatic negative SOB/CP -Echocardiogram; compared to echocardiogram 08/04/2015 EF improved  Hyperglycemia -Sensitive SSI  HLD/Hypertriglyceridemia -Lipitor 80 mg daily -Lipid panel; not within AHA guidelines -Will Add Niaspan, when patient able to take aspirin to prevent side effects   Right BKA -Stable  Hypokalemia -Potassium goal> 4 -Slightly elevated will correct with HD in A.m.  Hypomagnesemia -Magnesium goal>2     DVT prophylaxis: SCD Code Status: Full Family Communication: None Disposition Plan: Per GI   Consultants:  Dr.Robert Schertz nephrology Dr.Marc Magod GI   Procedures/Significant Events:  6/12 transfused 2 units PRBC 6/13 transfused 2 units PRBC 6/13 colonoscopy;ascending colon polyps x8 , diverticulosis in ascending colon and descending colon and sigmoid colon, cecal polyp x1, blood in ileum, rectal polyp x1 6/13 EGD;hiatal hernia, schatzki's ring, mild gastritis 6/14 NM RBC scan; no clear-cut bleeding source. 6/14 Transfuse 2 units PRBC  6/15 Transfuse 2 units PRBC 6/16 echocardiogram;- Left ventricle: posterior lateral hypokinesis.- moderately dilated. -LVEF=45% to 50%.  - Aortic valve:  moderate  stenosis. - Left atrium:  moderately dilated. 6/16 Capsule Endoscopy; Almost certainly right colon bleeding from diverticuli 6/17 transfuse 1 unit PRBC  Cultures NA  Antimicrobials: NA   Devices NA   LINES / TUBES:  NA    Continuous Infusions:    Subjective: 6/18 A/O 4, NAD. Negative abdominal pain, negative CP, negative SOB. Continued bright red blood per rectum however slowed.    Objective: Filed Vitals:   03/10/16 0400 03/10/16 0700 03/10/16 0750 03/10/16 0932  BP: 103/50 127/62  142/68  Pulse: 53 57 59   Temp:  98.4 F (36.9 C)    TempSrc:  Oral    Resp: 13 19    Height:      Weight:      SpO2: 100% 100%      Intake/Output Summary (Last 24 hours) at 03/10/16 1211 Last data filed at 03/10/16 0809  Gross per 24 hour  Intake   1072 ml  Output      0 ml  Net   1072 ml   Filed Weights   03/08/16 0926 03/08/16 1305 03/10/16 0350  Weight: 63.3 kg (139 lb 8.8 oz) 60.3 kg (132 lb 15 oz) 62.506 kg (137 lb 12.8 oz)    Examination:  General: A/O 4, NAD, No acute respiratory distress Eyes: negative scleral hemorrhage, negative anisocoria, negative icterus ENT: Negative Runny nose, negative gingival bleeding, Neck:  Negative scars, masses, torticollis, lymphadenopathy, JVD Lungs: Clear to auscultation bilaterally without wheezes or crackles Cardiovascular: Regular rate and rhythm without murmur gallop or rub normal S1 and S2 Abdomen: negative abdominal pain, nondistended, positive soft, bowel sounds, no rebound, no ascites, no appreciable mass Extremities: No significant cyanosis. Right BKA stable, Negative LLE edema Skin: Negative rashes, lesions, ulcers Psychiatric:  Negative depression, negative anxiety, negative fatigue, negative mania  Central nervous system:  Cranial nerves II through XII intact, tongue/uvula midline, all extremities muscle strength 5/5, sensation intact throughout, finger nose finger bilateral within normal limits, quick finger touch  bilateral within normal limits, negative dysarthria, negative expressive aphasia, negative receptive aphasia.  .     Data Reviewed: Care during the described time interval was provided by me .  I have reviewed this patient's available data, including medical history, events of note, physical examination, and all test results as part of my evaluation. I have personally reviewed and interpreted all radiology studies.  CBC:  Recent Labs Lab 03/03/16 2002  03/04/16 0441  03/06/16 1252 03/06/16 1535  03/08/16 1515 03/08/16 1828 03/09/16 0015 03/09/16 0600 03/09/16 1623  WBC 6.1  --  5.2  --  5.3 4.2  --  4.8  --   --   --   --   NEUTROABS 3.9  --   --   --  3.6 2.9  --   --   --   --   --   --   HGB 9.4*  < > 6.9*  < > 6.5* 5.8*  < > 9.6* 8.7* 8.4* 7.8* 9.0*  HCT 30.9*  < > 22.8*  < > 20.0* 18.0*  < > 29.6* 27.2* 26.2* 24.4* 28.5*  MCV 92.0  --  91.2  --  84.4 84.9  --  86.8  --   --   --   --   PLT 191  --  165  --  128* 115*  --  137*  --   --   --   --   < > = values in this interval not displayed. Basic Metabolic Panel:  Recent Labs Lab 03/05/16 0407 03/06/16 1851 03/07/16 0159 03/08/16 0019 03/08/16 1515 03/09/16 0600 03/10/16 0322  NA 134* 131* 135 131* 135 133* 132*  K 4.3 4.3 3.4* 4.0 3.9 4.5 5.3*  CL 95* 101 100* 98* 101 99* 96*  CO2 29 19* 30 25 28 28 26   GLUCOSE 95 145* 136* 126* 196* 102* 89  BUN 42* 57* 16 37* 8 26* 45*  CREATININE 6.10* 8.40* 3.40* 5.22* 2.60* 4.46* 6.03*  CALCIUM 8.7* 7.8* 7.8* 7.9* 8.0* 8.3* 8.4*  MG  --   --  1.7 2.2  --  1.9 1.9  PHOS 5.5* 8.0*  --   --  2.4*  --   --    GFR: Estimated Creatinine Clearance: 6.8 mL/min (by C-G formula based on Cr of 6.03). Liver Function Tests:  Recent Labs Lab 03/04/16 0441 03/05/16 0407 03/06/16 1851 03/08/16 1515  AST 12*  --   --   --   ALT 8*  --   --   --   ALKPHOS 53  --   --   --   BILITOT 0.5  --   --   --   PROT 5.1*  --   --   --   ALBUMIN 2.4* 2.5* 2.0* 2.3*   No results for  input(s): LIPASE, AMYLASE in the last 168 hours. No results for input(s): AMMONIA in the last 168 hours. Coagulation Profile: No results for input(s): INR, PROTIME in the last 168 hours. Cardiac Enzymes:  Recent Labs Lab 03/04/16 0441 03/04/16 1100 03/07/16 1841 03/08/16 0019 03/08/16 0930  TROPONINI 0.10* 0.11* 0.44* 0.49* 0.49*   BNP (last 3 results) No results for input(s): PROBNP in the last 8760 hours. HbA1C: No results for input(s): HGBA1C in the last 72 hours. CBG:  Recent Labs Lab 03/09/16 1733 03/09/16 1956 03/09/16 2346 03/10/16 0332 03/10/16 0802  GLUCAP 83 141* 111* 113* 102*   Lipid Profile: No results for input(s): CHOL, HDL, LDLCALC, TRIG, CHOLHDL, LDLDIRECT in the last 72 hours. Thyroid Function Tests: No results for input(s): TSH, T4TOTAL, FREET4, T3FREE, THYROIDAB in the last 72 hours. Anemia Panel: No results for input(s): VITAMINB12, FOLATE, FERRITIN, TIBC, IRON, RETICCTPCT in the last 72 hours. Urine analysis:    Component Value Date/Time   COLORURINE YELLOW 06/22/2014 [place] 06/22/2014 0531   LABSPEC 1.015 06/22/2014 0531   PHURINE 6.0 06/22/2014 0531   GLUCOSEU 100* 06/22/2014 0531   HGBUR SMALL* 06/22/2014 0531   HGBUR trace-lysed 06/03/2008 0855   BILIRUBINUR NEGATIVE 06/22/2014 0531   KETONESUR TRACE* 06/22/2014 0531   PROTEINUR 100* 06/22/2014 0531   UROBILINOGEN 0.2 06/22/2014 0531   NITRITE NEGATIVE 06/22/2014 0531   LEUKOCYTESUR NEGATIVE 06/22/2014 0531   Sepsis Labs: @LABRCNTIP (procalcitonin:4,lacticidven:4)  ) Recent Results (from the past 240 hour(s))  MRSA PCR Screening     Status: None   Collection Time: 03/03/16 10:09 PM  Result Value Ref Range Status   MRSA by PCR NEGATIVE NEGATIVE Final    Comment:        The GeneXpert MRSA Assay (FDA approved for NASAL specimens only), is one component of a comprehensive MRSA colonization surveillance program. It is not intended to diagnose MRSA infection  nor to guide or monitor treatment for MRSA infections.          Radiology Studies: No results  found.      Scheduled Meds: . amLODipine  5 mg Oral Daily  . atorvastatin  80 mg Oral q1800  . brinzolamide  1 drop Both Eyes BID   And  . brimonidine  1 drop Both Eyes BID  . calcitRIOL  1.5 mcg Oral Q M,W,F-HD  . carvedilol  6.25 mg Oral BID WC  . [START ON 03/15/2016] darbepoetin (ARANESP) injection - DIALYSIS  100 mcg Intravenous Q Fri-HD  . feeding supplement  1 Container Oral TID WC & HS  . feeding supplement (PRO-STAT SUGAR FREE 64)  30 mL Oral BID  . insulin aspart  0-9 Units Subcutaneous Q4H  . isosorbide mononitrate  15 mg Oral QHS  . latanoprost  1 drop Both Eyes QHS  . multivitamin  1 tablet Oral Daily  . pantoprazole  40 mg Oral QAC breakfast  . sevelamer carbonate  800 mg Oral With snacks  . sodium chloride flush  3 mL Intravenous Q12H   Continuous Infusions:    LOS: 7 days    Time spent: 40 minutes    Zalayah Pizzuto, Geraldo Docker, MD [facility] Pager [phone]   If 7PM-7AM, please contact night-coverage [email or web address] Password [facility] 03/10/2016, 12:11 PM

## 2016-03-10 NOTE — Progress Notes (Signed)
Pt had large dark red BM.  Dr. Sherral Hammers and Dr. Amedeo Plenty notified.  Orders given from Dr. Amedeo Plenty to put pt on Clear liquid diet, draw STAT CBC, check BP and pulse every 2 hours, notify if any change in physical or mental status and if pt has another bloody stool. Pt states that he feels fine. Family at bedside. Will continue to monitor.

## 2016-03-10 NOTE — Progress Notes (Signed)
[facility] Progress Note   Subjective: no c/o.  S/p 9 units prbc overall this admission.  1 u yesterday, last Hb was 9.0.  No c/o this am.  K 5.3  Filed Vitals:   03/10/16 0000 03/10/16 0337 03/10/16 0350 03/10/16 0400  BP: 88/48   103/50  Pulse: 53   53  Temp:  98.2 F (36.8 C)    TempSrc:  Oral    Resp: 14   13  Height:      Weight:   62.506 kg (137 lb 12.8 oz)   SpO2: 97%   100%    Inpatient medications: . amLODipine  5 mg Oral Daily  . atorvastatin  80 mg Oral q1800  . brinzolamide  1 drop Both Eyes BID   And  . brimonidine  1 drop Both Eyes BID  . calcitRIOL  1.5 mcg Oral Q M,W,F-HD  . carvedilol  6.25 mg Oral BID WC  . [START ON 03/15/2016] darbepoetin (ARANESP) injection - DIALYSIS  100 mcg Intravenous Q Fri-HD  . feeding supplement  1 Container Oral TID WC & HS  . feeding supplement (PRO-STAT SUGAR FREE 64)  30 mL Oral BID  . insulin aspart  0-9 Units Subcutaneous Q4H  . isosorbide mononitrate  15 mg Oral QHS  . latanoprost  1 drop Both Eyes QHS  . multivitamin  1 tablet Oral Daily  . pantoprazole  40 mg Oral QAC breakfast  . sevelamer carbonate  800 mg Oral With snacks  . sodium chloride flush  3 mL Intravenous Q12H     acetaminophen **OR** acetaminophen, gabapentin, nitroGLYCERIN, oxyCODONE, polyvinyl alcohol  Exam: Alert, no distress, calm No jvd Chest clear bilat RRR 2/6 sem, no RG ABd soft ntnd Rectal tube draining bloody liquid  L BKA/ R AKA, no LE edema LFA AVF +Bruit Ox 3, nf  Dialysis: [facility] MWF  3.5h  56.5kg  2/2.25 bath  P4  Hep none  LFA AVF Mircera 150 mcg IV q 2 weeks (last dose 02/21/16 100 mcg IV HGB 10.2 03/09/16) Calcitriol 1.75 mcg IV q MWF (Last PTH 651 02/07/16)      Assessment: 1  Lower GI bleed - sp 9 units prbc; capsule endo showed R colon bleed. Hb 9 today 2  Anemia ABL/ CKD-  getting darbe 100/ wk here (last 6/15) 3  HTN on about 1/2 of his home meds , low dose coreg/ norvasc, stable 4  Vol^ - wt's up, 6kg  over dry wt today if wts are accurate 5  MBD no chg 6  PAD bilat amputee 7  Hx CVA 8  CM EF 30%/ aortic stenosis 9  Hx known CAD w chron stable angina on med Rx  Plan - HD in am, max UF   Kelly Splinter MD [facility] pager [phone]    cell [phone] 03/10/2016, 6:27 AM    Recent Labs Lab 03/05/16 0407 03/06/16 1851  03/08/16 1515 03/09/16 0600 03/10/16 0322  NA 134* 131*  < > 135 133* 132*  K 4.3 4.3  < > 3.9 4.5 5.3*  CL 95* 101  < > 101 99* 96*  CO2 29 19*  < > 28 28 26   GLUCOSE 95 145*  < > 196* 102* 89  BUN 42* 57*  < > 8 26* 45*  CREATININE 6.10* 8.40*  < > 2.60* 4.46* 6.03*  CALCIUM 8.7* 7.8*  < > 8.0* 8.3* 8.4*  PHOS 5.5* 8.0*  --  2.4*  --   --   < > =  values in this interval not displayed.  Recent Labs Lab 03/04/16 0441 03/05/16 0407 03/06/16 1851 03/08/16 1515  AST 12*  --   --   --   ALT 8*  --   --   --   ALKPHOS 53  --   --   --   BILITOT 0.5  --   --   --   PROT 5.1*  --   --   --   ALBUMIN 2.4* 2.5* 2.0* 2.3*    Recent Labs Lab 03/03/16 2002  03/06/16 1252 03/06/16 1535  03/08/16 1515  03/09/16 0015 03/09/16 0600 03/09/16 1623  WBC 6.1  < > 5.3 4.2  --  4.8  --   --   --   --   NEUTROABS 3.9  --  3.6 2.9  --   --   --   --   --   --   HGB 9.4*  < > 6.5* 5.8*  < > 9.6*  < > 8.4* 7.8* 9.0*  HCT 30.9*  < > 20.0* 18.0*  < > 29.6*  < > 26.2* 24.4* 28.5*  MCV 92.0  < > 84.4 84.9  --  86.8  --   --   --   --   PLT 191  < > 128* 115*  --  137*  --   --   --   --   < > = values in this interval not displayed. Iron/TIBC/Ferritin/ %Sat    Component Value Date/Time   IRON 24* 10/25/2015 1628   TIBC 113* 10/25/2015 1628   FERRITIN 1242* 10/25/2015 1628   IRONPCTSAT 21 10/25/2015 1628

## 2016-03-10 NOTE — Progress Notes (Signed)
[facility] Progress Note  Subjective: Feels good, wants to eat, no stool output in 32 hours at least. Hemoglobin up from 7.8 9.0 after 1 unit packed red blood cells yesterday.  Objective: Vital signs in last 24 hours: Temp:  [97.4 F (36.3 C)-98.4 F (36.9 C)] 98.4 F (36.9 C) (06/18 0700) Pulse Rate:  [41-97] 59 (06/18 0750) Resp:  [10-20] 19 (06/18 0700) BP: (80-127)/(45-62) 127/62 mmHg (06/18 0700) SpO2:  [97 %-100 %] 100 % (06/18 0700) Weight:  [62.506 kg (137 lb 12.8 oz)] 62.506 kg (137 lb 12.8 oz) (06/18 0350) Weight change: -0.794 kg (-1 lb 12 oz)   PE: Changed  Lab Results: Results for orders placed or performed during the hospital encounter of 03/03/16 (from the past 24 hour(s))  Prepare RBC     Status: None   Collection Time: 03/09/16  9:22 AM  Result Value Ref Range   Order Confirmation ORDER PROCESSED BY BLOOD BANK   Glucose, capillary     Status: Abnormal   Collection Time: 03/09/16 12:11 PM  Result Value Ref Range   Glucose-Capillary 129 (H) 65 - 99 mg/dL  Hemoglobin and hematocrit, blood     Status: Abnormal   Collection Time: 03/09/16  4:23 PM  Result Value Ref Range   Hemoglobin 9.0 (L) 13.0 - 17.0 g/dL   HCT 28.5 (L) 39.0 - 52.0 %  Type and screen [place]     Status: None   Collection Time: 03/09/16  4:23 PM  Result Value Ref Range   ABO/RH(D) O POS    Antibody Screen NEG    Sample Expiration 03/12/2016   Glucose, capillary     Status: None   Collection Time: 03/09/16  5:33 PM  Result Value Ref Range   Glucose-Capillary 83 65 - 99 mg/dL  Glucose, capillary     Status: Abnormal   Collection Time: 03/09/16  7:56 PM  Result Value Ref Range   Glucose-Capillary 141 (H) 65 - 99 mg/dL  Glucose, capillary     Status: Abnormal   Collection Time: 03/09/16 11:46 PM  Result Value Ref Range   Glucose-Capillary 111 (H) 65 - 99 mg/dL  Basic metabolic panel     Status: Abnormal   Collection Time: 03/10/16  3:22 AM  Result  Value Ref Range   Sodium 132 (L) 135 - 145 mmol/L   Potassium 5.3 (H) 3.5 - 5.1 mmol/L   Chloride 96 (L) 101 - 111 mmol/L   CO2 26 22 - 32 mmol/L   Glucose, Bld 89 65 - 99 mg/dL   BUN 45 (H) 6 - 20 mg/dL   Creatinine, Ser 6.03 (H) 0.61 - 1.24 mg/dL   Calcium 8.4 (L) 8.9 - 10.3 mg/dL   GFR calc non Af Amer 8 (L) >60 mL/min   GFR calc Af Amer 9 (L) >60 mL/min   Anion gap 10 5 - 15  Magnesium     Status: None   Collection Time: 03/10/16  3:22 AM  Result Value Ref Range   Magnesium 1.9 1.7 - 2.4 mg/dL  Glucose, capillary     Status: Abnormal   Collection Time: 03/10/16  3:32 AM  Result Value Ref Range   Glucose-Capillary 113 (H) 65 - 99 mg/dL  Glucose, capillary     Status: Abnormal   Collection Time: 03/10/16  8:02 AM  Result Value Ref Range   Glucose-Capillary 102 (H) 65 - 99 mg/dL    Studies/Results: No results found.    Assessment: Lower GI  bleed, persistent right-sided diverticular versus distal small bowel.  Plan: Advance diet, continue to monitor stools and hemoglobin.    Laren Orama C 03/10/2016, 9:19 AM  Pager [phone] If no answer or after 5 PM call [phone]

## 2016-03-11 ENCOUNTER — Encounter (HOSPITAL_COMMUNITY): Payer: Self-pay | Admitting: Gastroenterology

## 2016-03-11 LAB — GLUCOSE, CAPILLARY
GLUCOSE-CAPILLARY: 129 mg/dL — AB (ref 65–99)
GLUCOSE-CAPILLARY: 132 mg/dL — AB (ref 65–99)
GLUCOSE-CAPILLARY: 82 mg/dL (ref 65–99)
Glucose-Capillary: 133 mg/dL — ABNORMAL HIGH (ref 65–99)
Glucose-Capillary: 214 mg/dL — ABNORMAL HIGH (ref 65–99)

## 2016-03-11 LAB — BASIC METABOLIC PANEL
Anion gap: 9 (ref 5–15)
BUN: 80 mg/dL — AB (ref 6–20)
CALCIUM: 7.9 mg/dL — AB (ref 8.9–10.3)
CHLORIDE: 96 mmol/L — AB (ref 101–111)
CO2: 21 mmol/L — AB (ref 22–32)
CREATININE: 8.23 mg/dL — AB (ref 0.61–1.24)
GFR calc non Af Amer: 5 mL/min — ABNORMAL LOW (ref >60)
GFR, EST AFRICAN AMERICAN: 6 mL/min — AB (ref >60)
Glucose, Bld: 90 mg/dL (ref 65–99)
Potassium: 5.4 mmol/L — ABNORMAL HIGH (ref 3.5–5.1)
Sodium: 126 mmol/L — ABNORMAL LOW (ref 135–145)

## 2016-03-11 LAB — MAGNESIUM: Magnesium: 1.8 mg/dL (ref 1.7–2.4)

## 2016-03-11 LAB — HEMOGLOBIN AND HEMATOCRIT, BLOOD
HCT: 27 % — ABNORMAL LOW (ref 39.0–52.0)
Hemoglobin: 8.6 g/dL — ABNORMAL LOW (ref 13.0–17.0)

## 2016-03-11 MED ORDER — POLYETHYLENE GLYCOL 3350 17 G PO PACK
17.0000 g | PACK | Freq: Two times a day (BID) | ORAL | Status: DC
  Administered 2016-03-11 – 2016-03-14 (×6): 17 g via ORAL
  Filled 2016-03-11 (×6): qty 1

## 2016-03-11 MED ORDER — INSULIN ASPART 100 UNIT/ML ~~LOC~~ SOLN
0.0000 [IU] | Freq: Three times a day (TID) | SUBCUTANEOUS | Status: DC
Start: 2016-03-12 — End: 2016-03-14
  Administered 2016-03-12 – 2016-03-13 (×3): 2 [IU] via SUBCUTANEOUS

## 2016-03-11 MED ORDER — OXYCODONE HCL 5 MG PO TABS
5.0000 mg | ORAL_TABLET | Freq: Four times a day (QID) | ORAL | Status: DC | PRN
  Administered 2016-03-11 – 2016-03-13 (×3): 5 mg via ORAL
  Filled 2016-03-11 (×3): qty 1

## 2016-03-11 MED ORDER — SODIUM CHLORIDE 0.9 % IV SOLN: 100.0000 mL | INTRAVENOUS | Status: DC | PRN

## 2016-03-11 MED ORDER — SODIUM CHLORIDE 0.9 % IV SOLN
100.0000 mL | INTRAVENOUS | Status: DC | PRN
Start: 2016-03-11 — End: 2016-03-11

## 2016-03-11 MED ORDER — LIDOCAINE HCL (PF) 1 % IJ SOLN: 5.0000 mL | INTRAMUSCULAR | Status: DC | PRN

## 2016-03-11 MED ORDER — LIDOCAINE-PRILOCAINE 2.5-2.5 % EX CREA: 1.0000 "application " | TOPICAL_CREAM | CUTANEOUS | Status: DC | PRN

## 2016-03-11 MED ORDER — OXYCODONE HCL 5 MG PO TABS
ORAL_TABLET | ORAL | Status: AC
  Filled 2016-03-11: qty 1

## 2016-03-11 MED ORDER — PENTAFLUOROPROP-TETRAFLUOROETH EX AERO: 1.0000 "application " | INHALATION_SPRAY | CUTANEOUS | Status: DC | PRN

## 2016-03-11 MED ORDER — HEPARIN SODIUM (PORCINE) 1000 UNIT/ML DIALYSIS
1000.0000 [IU] | INTRAMUSCULAR | Status: DC | PRN
  Filled 2016-03-11: qty 1

## 2016-03-11 MED ORDER — HEPARIN SODIUM (PORCINE) 1000 UNIT/ML DIALYSIS: 1000.0000 [IU] | INTRAMUSCULAR | Status: DC | PRN

## 2016-03-11 MED ORDER — CALCITRIOL 0.5 MCG PO CAPS
ORAL_CAPSULE | ORAL | Status: AC
  Filled 2016-03-11: qty 3

## 2016-03-11 MED ORDER — LIDOCAINE-PRILOCAINE 2.5-2.5 % EX CREA
1.0000 "application " | TOPICAL_CREAM | CUTANEOUS | Status: DC | PRN
  Filled 2016-03-11: qty 5

## 2016-03-11 MED ORDER — ALTEPLASE 2 MG IJ SOLR: 2.0000 mg | Freq: Once | INTRAMUSCULAR | Status: DC | PRN

## 2016-03-11 MED ORDER — INSULIN ASPART 100 UNIT/ML ~~LOC~~ SOLN
0.0000 [IU] | Freq: Every day | SUBCUTANEOUS | Status: DC
  Administered 2016-03-12: 1 [IU] via SUBCUTANEOUS

## 2016-03-11 NOTE — Progress Notes (Signed)
No bm or bleeding today.  No c/o's.  Hgb slt lower, but w/in possible lab variation.  IMPR:  Quiescent, or minimal, bleeding--source felt to be proximal colon.  PLAN:  1. Adv diet tomorrow if stable w/out evid of further bld 2. Miralax to help purge old bld out of colon  Cleotis Nipper, M.D. Pager [phone] If no answer or after 5 PM call [phone]

## 2016-03-11 NOTE — Progress Notes (Signed)
[place] TEAM 1 - Stepdown/ICU TEAM  Randy [name]  [id number] DOB: 02-08-1932 DOA: 03/03/2016 PCP: [name], MD    Brief Narrative:  82 y.o. M Hx CVA, ESRD on HD (M, W, F), HTN, nonischemic cardiomyopathy (EF 35-40%), PVD S/P B LE amputations , CAD, HLD, HTN, and chronic anemia who presented w/ c/o bleeding per rectum of acute onset.   Subjective: The patient is resting comfortably in bed with no complaints.  He has not had a bowel movement today.  He denies abdominal pain chest pain shortness breath fevers or chills.  He is hungry and anxious to be allowed to eat.  Assessment & Plan:  Lower GI bleed -6/16 capsule endoscopy noted right colon bleed - EGD and colonoscopy earlier in hospital stay without evidence of acute source - continue to follow hemoglobin trend  Acute blood loss anemia on anemia of CKD -s/p 9 units PRBC this admit - check CBC every 12 hours and transfuse as needed for hemoglobin less than 8.0  ESRD on HD M/W/F -Per Nephrology  Cardiomyopathy, ischemic -volume management per hemodialysis - no evidence of volume overload at present  Aortic stenosis -Murmur appreciable on exam - no evidence of volume overload at present  HTN -Blood pressure currently well controlled post hemodialysis today - follow trend  Moderate CAD (per cath 2015) - waxing and waning CP -Most likely secondary to patient's acute anemia > demand ischemia -Has a component of chronic recurrent chest pain managed medically in outpatient setting -NTG PRN  -Will have to consider whether aspirin is safe to continue using in the outpatient setting -TTE compared to echocardiogram 08/04/2015 EF improved  Hyperglycemia -CBG well controlled on limited diet presently  HLD / Hypertriglyceridemia -Lipitor 80 mg daily  Hypokalemia -Resolved  Hypomagnesemia -Resolved   DVT prophylaxis: SCDs Code Status: FULL CODE Family Communication: no family present at time of exam    Disposition Plan:   Consultants:  GI Nephrology  Cardiology  Procedures: 6/13 colonoscopy ascending colon polyps x8 , diverticulosis in ascending colon and descending colon and sigmoid colon, cecal polyp x1, blood in ileum, rectal polyp x1 6/13 EGD hiatal hernia, schatzki's ring, mild gastritis 6/14 NM RBC scan; no clear-cut bleeding source. 6/16 TTE - Left ventricle: posterior lateral hypokinesis.- moderately dilated. -LVEF=45% to 50%.  - Aortic valve: moderate stenosis. - Left atrium: moderately dilated. 6/16 Capsule Endoscopy - almost certainly right colon bleeding from diverticuli  Antimicrobials:  None  Objective: Blood pressure 125/51, pulse 55, temperature 98.5 F (36.9 C), temperature source Oral, resp. rate 13, height 4\' 9"  (1.448 m), weight 57.3 kg (126 lb 5.2 oz), SpO2 99 %.  Intake/Output Summary (Last 24 hours) at 03/11/16 1711 Last data filed at 03/11/16 1400  Gross per 24 hour  Intake    243 ml  Output   3303 ml  Net  -3060 ml   Filed Weights   03/11/16 0414 03/11/16 0715 03/11/16 1120  Weight: 63.594 kg (140 lb 3.2 oz) 61.2 kg (134 lb 14.7 oz) 57.3 kg (126 lb 5.2 oz)    Examination: General: No acute respiratory distress Lungs: Clear to auscultation bilaterally without wheezes or crackles Cardiovascular: Regular rate and rhythm with 3/6 holosystolic murmur Abdomen: Nontender, nondistended, soft, bowel sounds positive, no rebound, no ascites, no appreciable mass Extremities: No significant cyanosis, clubbing, or edema bilateral lower extremities stumps  CBC:  Recent Labs Lab 03/06/16 1252 03/06/16 1535  03/08/16 1515  03/09/16 1623 03/10/16 1256 03/10/16 1527 03/10/16 1834 03/11/16 0730  WBC 5.3 4.2  --  4.8  --   --   --  6.8  --   --   NEUTROABS 3.6 2.9  --   --   --   --   --   --   --   --   HGB 6.5* 5.8*  < > 9.6*  < > 9.0* 8.7* 9.0* 9.7* 8.6*  HCT 20.0* 18.0*  < > 29.6*  < > 28.5* 27.6* 28.4* 31.1* 27.0*  MCV 84.4 84.9  --  86.8  --    --   --  90.2  --   --   PLT 128* 115*  --  137*  --   --   --  189  --   --   < > = values in this interval not displayed. Basic Metabolic Panel:  Recent Labs Lab 03/05/16 0407 03/06/16 1851 03/07/16 0159 03/08/16 0019 03/08/16 1515 03/09/16 0600 03/10/16 0322 03/11/16 0203 03/11/16 0730  NA 134* 131* 135 131* 135 133* 132*  --  126*  K 4.3 4.3 3.4* 4.0 3.9 4.5 5.3*  --  5.4*  CL 95* 101 100* 98* 101 99* 96*  --  96*  CO2 29 19* 30 25 28 28 26   --  21*  GLUCOSE 95 145* 136* 126* 196* 102* 89  --  90  BUN 42* 57* 16 37* 8 26* 45*  --  80*  CREATININE 6.10* 8.40* 3.40* 5.22* 2.60* 4.46* 6.03*  --  8.23*  CALCIUM 8.7* 7.8* 7.8* 7.9* 8.0* 8.3* 8.4*  --  7.9*  MG  --   --  1.7 2.2  --  1.9 1.9 1.8  --   PHOS 5.5* 8.0*  --   --  2.4*  --   --   --   --    GFR: Estimated Creatinine Clearance: 4.8 mL/min (by C-G formula based on Cr of 8.23).  Liver Function Tests:  Recent Labs Lab 03/05/16 0407 03/06/16 1851 03/08/16 1515  ALBUMIN 2.5* 2.0* 2.3*    Cardiac Enzymes:  Recent Labs Lab 03/07/16 1841 03/08/16 0019 03/08/16 0930  TROPONINI 0.44* 0.49* 0.49*    HbA1C: HGB A1C MFR BLD  Date/Time Value Ref Range Status  10/21/2015 01:50 PM 5.4 4.8 - 5.6 % Final    Comment:    (NOTE)         Pre-diabetes: 5.7 - 6.4         Diabetes: >6.4         Glycemic control for adults with diabetes: <7.0   06/21/2014 06:55 PM 5.7* <5.7 % Final    Comment:    (NOTE)                                                                       According to the ADA Clinical Practice Recommendations for 2011, when HbA1c is used as a screening test:  >=6.5%   Diagnostic of Diabetes Mellitus           (if abnormal result is confirmed) 5.7-6.4%   Increased risk of developing Diabetes Mellitus References:Diagnosis and Classification of Diabetes Mellitus,Diabetes [id number] 1):S62-S69 and Standards of Medical Care in         Diabetes - 2011,Diabetes AM:[id number] (Suppl  1):S11-S61.     CBG:  Recent Labs Lab 03/10/16 1946 03/11/16 0007 03/11/16 0413 03/11/16 1242 03/11/16 1603  GLUCAP 175* 132* 82 133* 129*    Recent Results (from the past 240 hour(s))  MRSA PCR Screening     Status: None   Collection Time: 03/03/16 10:09 PM  Result Value Ref Range Status   MRSA by PCR NEGATIVE NEGATIVE Final    Comment:        The GeneXpert MRSA Assay (FDA approved for NASAL specimens only), is one component of a comprehensive MRSA colonization surveillance program. It is not intended to diagnose MRSA infection nor to guide or monitor treatment for MRSA infections.      Scheduled Meds: . amLODipine  5 mg Oral Daily  . atorvastatin  80 mg Oral q1800  . brinzolamide  1 drop Both Eyes BID   And  . brimonidine  1 drop Both Eyes BID  . calcitRIOL      . calcitRIOL  1.5 mcg Oral Q M,W,F-HD  . carvedilol  6.25 mg Oral BID WC  . [START ON 03/15/2016] darbepoetin (ARANESP) injection - DIALYSIS  100 mcg Intravenous Q Fri-HD  . feeding supplement  1 Container Oral TID WC & HS  . feeding supplement (PRO-STAT SUGAR FREE 64)  30 mL Oral BID  . insulin aspart  0-9 Units Subcutaneous Q4H  . isosorbide mononitrate  15 mg Oral QHS  . latanoprost  1 drop Both Eyes QHS  . multivitamin  1 tablet Oral Daily  . pantoprazole  40 mg Oral QAC breakfast  . polyethylene glycol  17 g Oral BID  . sevelamer carbonate  800 mg Oral With snacks  . sodium chloride flush  3 mL Intravenous Q12H    LOS: 8 days   Time spent: 35 minutes   Cherene Altes, MD [facility] Office  [phone] Pager - Text Page per Amion as per below:  On-Call/Text Page:      [email or web address]      password [email or web address]  If 7PM-7AM, please contact night-coverage [email or web address] Password [email or web address] 03/11/2016, 5:11 PM

## 2016-03-11 NOTE — Procedures (Signed)
I have personally attended this patient's dialysis session.   2K2.25 Ca bath (K 5.4) 4 liter goal  4 hour tmt (usu 3.5 hours) Pre weight 61.2 (EDW 56.5) L AVF 400 no issues No heparin  Jamal Maes, MD [place] Pager 03/11/2016, 9:18 AM

## 2016-03-11 NOTE — Progress Notes (Signed)
[facility] Progress Note   Subjective:  S/p 9 units prbc overall this admission.  Last transfused 6/17 Had large dark red BM yesterday (6/18) - says had at least 1, can't recall if any more No abd pain Last Hb down to 8.6 from 9.7. Back on clears Says end of stump has been hurting some   Filed Vitals:   03/11/16 0715 03/11/16 0726 03/11/16 0730 03/11/16 0800  BP: 122/54 110/52 111/58 96/49  Pulse: 59 54 62 61  Temp: 98.2 F (36.8 C)     TempSrc: Oral     Resp: 13     Height:      Weight: 61.2 kg (134 lb 14.7 oz)     SpO2: 98%       Physical Exam: Seen on HD Alert, no distress, calm No jvd Lungs clear Regular. Normal [id number] 2/6 systolic murmur LSB Abd non-tender L BKA/ R AKA, no LE edema R BK stump minimally cool at end, no ulcer, necrosis, hematoma LFA AVF currently cannulated and in use Alert, oriented X 3   Inpatient medications: . amLODipine  5 mg Oral Daily  . atorvastatin  80 mg Oral q1800  . brinzolamide  1 drop Both Eyes BID   And  . brimonidine  1 drop Both Eyes BID  . calcitRIOL  1.5 mcg Oral Q M,W,F-HD  . carvedilol  6.25 mg Oral BID WC  . [START ON 03/15/2016] darbepoetin (ARANESP) injection - DIALYSIS  100 mcg Intravenous Q Fri-HD  . feeding supplement  1 Container Oral TID WC & HS  . feeding supplement (PRO-STAT SUGAR FREE 64)  30 mL Oral BID  . insulin aspart  0-9 Units Subcutaneous Q4H  . isosorbide mononitrate  15 mg Oral QHS  . latanoprost  1 drop Both Eyes QHS  . multivitamin  1 tablet Oral Daily  . oxyCODONE      . pantoprazole  40 mg Oral QAC breakfast  . sevelamer carbonate  800 mg Oral With snacks  . sodium chloride flush  3 mL Intravenous Q12H     sodium chloride, sodium chloride, acetaminophen **OR** acetaminophen, alteplase, gabapentin, heparin, lidocaine (PF), lidocaine-prilocaine, nitroGLYCERIN, oxyCODONE, pentafluoroprop-tetrafluoroeth, polyvinyl alcohol    Recent Labs Lab 03/05/16 0407 03/06/16 1851   03/08/16 1515 03/09/16 0600 03/10/16 0322 03/11/16 0730  NA 134* 131*  < > 135 133* 132* 126*  K 4.3 4.3  < > 3.9 4.5 5.3* 5.4*  CL 95* 101  < > 101 99* 96* 96*  CO2 29 19*  < > 28 28 26  21*  GLUCOSE 95 145*  < > 196* 102* 89 90  BUN 42* 57*  < > 8 26* 45* 80*  CREATININE 6.10* 8.40*  < > 2.60* 4.46* 6.03* 8.23*  CALCIUM 8.7* 7.8*  < > 8.0* 8.3* 8.4* 7.9*  PHOS 5.5* 8.0*  --  2.4*  --   --   --   < > = values in this interval not displayed.  Recent Labs Lab 03/05/16 0407 03/06/16 1851 03/08/16 1515  ALBUMIN 2.5* 2.0* 2.3*    Recent Labs Lab 03/06/16 1252 03/06/16 1535  03/08/16 1515  03/10/16 1527 03/10/16 1834 03/11/16 0730  WBC 5.3 4.2  --  4.8  --  6.8  --   --   NEUTROABS 3.6 2.9  --   --   --   --   --   --   HGB 6.5* 5.8*  < > 9.6*  < > 9.0* 9.7* 8.6*  HCT 20.0* 18.0*  < > 29.6*  < > 28.4* 31.1* 27.0*  MCV 84.4 84.9  --  86.8  --  90.2  --   --   PLT 128* 115*  --  137*  --  189  --   --   < > = values in this interval not displayed.   Iron/TIBC/Ferritin/ %Sat    Component Value Date/Time   IRON 24* 10/25/2015 1628   TIBC 113* 10/25/2015 1628   FERRITIN 1242* 10/25/2015 1628   IRONPCTSAT 21 10/25/2015 1628     Dialysis: [facility] MWF  3.5h  56.5kg  2/2.25 bath  P4  Hep none  LFA AVF Mircera 150 mcg IV q 2 weeks (last dose 02/21/16 100 mcg IV HGB 10.2 03/09/16) Calcitriol 1.75 mcg IV q MWF (Last PTH 651 02/07/16)      Assessment: 1. Lower GI bleed - sp 9 units prbc; capsule endo showed R colon bleed. Appeared had stopped, then large bloody stool again yesterday with sl lower Hb. GI following. Await further recs. (options watch vs angio vs hemicolect) 2. Anemia ABL/ CKD-  Darbe 100/ wk here (last 6/15) 3. HTN on about 1/2 of his home meds , low dose coreg/ norvasc, stable 4. Vol^ - wt's up, 6kg over dry wt today if wts are accurate. Going for 4 liters w/HD today 5. MBD Calcitriol/sevelamer. Last phos was 2.4 on 6/16.  6. PAD bilat amputee (L AK, R  BK) 7. Hx CVA 8. CM EF 30%/ aortic stenosis 9. Hx known CAD w chron stable angina on med Rx  Jamal Maes, MD [facility] [phone] Pager 03/11/2016, 9:03 AM

## 2016-03-12 LAB — RENAL FUNCTION PANEL
ALBUMIN: 2.3 g/dL — AB (ref 3.5–5.0)
ANION GAP: 10 (ref 5–15)
BUN: 36 mg/dL — AB (ref 6–20)
CHLORIDE: 96 mmol/L — AB (ref 101–111)
CO2: 27 mmol/L (ref 22–32)
Calcium: 8.9 mg/dL (ref 8.9–10.3)
Creatinine, Ser: 5.2 mg/dL — ABNORMAL HIGH (ref 0.61–1.24)
GFR calc Af Amer: 11 mL/min — ABNORMAL LOW (ref >60)
GFR calc non Af Amer: 9 mL/min — ABNORMAL LOW (ref >60)
GLUCOSE: 82 mg/dL (ref 65–99)
PHOSPHORUS: 6.4 mg/dL — AB (ref 2.5–4.6)
POTASSIUM: 4.6 mmol/L (ref 3.5–5.1)
Sodium: 133 mmol/L — ABNORMAL LOW (ref 135–145)

## 2016-03-12 LAB — CBC
HEMATOCRIT: 29.8 % — AB (ref 39.0–52.0)
HEMOGLOBIN: 9.4 g/dL — AB (ref 13.0–17.0)
MCH: 29.2 pg (ref 26.0–34.0)
MCHC: 31.5 g/dL (ref 30.0–36.0)
MCV: 92.5 fL (ref 78.0–100.0)
Platelets: 201 10*3/uL (ref 150–400)
RBC: 3.22 MIL/uL — ABNORMAL LOW (ref 4.22–5.81)
RDW: 17.7 % — AB (ref 11.5–15.5)
WBC: 6.3 10*3/uL (ref 4.0–10.5)

## 2016-03-12 LAB — GLUCOSE, CAPILLARY
GLUCOSE-CAPILLARY: 88 mg/dL (ref 65–99)
GLUCOSE-CAPILLARY: 105 mg/dL — AB (ref 65–99)
GLUCOSE-CAPILLARY: 151 mg/dL — AB (ref 65–99)
Glucose-Capillary: 136 mg/dL — ABNORMAL HIGH (ref 65–99)

## 2016-03-12 MED ORDER — SEVELAMER CARBONATE 800 MG PO TABS
800.0000 mg | ORAL_TABLET | Freq: Three times a day (TID) | ORAL | Status: DC
  Administered 2016-03-12 (×2): 800 mg via ORAL
  Filled 2016-03-12 (×2): qty 1

## 2016-03-12 MED ORDER — SEVELAMER CARBONATE 800 MG PO TABS: 800.0000 mg | ORAL_TABLET | Freq: Three times a day (TID) | ORAL | Status: DC

## 2016-03-12 NOTE — Progress Notes (Signed)
Admission note:  Arrival Method: Patient arrived in bed accompanied by the staff from [facility]. Orientation: Alert and oriented x 4. Telemetry: 6E-30, afib.  Skin: warm and dry in the bottom, right BKA, L AKA.   Pain: Denies any pain currently. Tubes: N/A Safety Measures: Bed in low position, call bell and phone within reach.  Bed alarm initiated. Fall Prevention Safety Plan: Reviewed the plan, understood and acknowledged. Admission Screening: Completed.6700 Orientation: Patient has been oriented to the unit, staff and to the room.

## 2016-03-12 NOTE — Care Management Important Message (Signed)
Important Message  Patient Details  Name: Randy [name] MRN: [id number] Date of Birth: 11/03/1931   Medicare Important Message Given:  Yes    Nathen May 03/12/2016, 10:45 AM

## 2016-03-12 NOTE — Progress Notes (Signed)
[facility] Rounding Note  Subjective:  No cos , no further reported bleeding / tolerated HD yest.   Objective Vital signs in last 24 hours: Filed Vitals:   03/12/16 0600 03/12/16 0745 03/12/16 0819 03/12/16 1000  BP: 129/55 129/55 148/65 109/59  Pulse:  57 70 58  Temp:  97.8 F (36.6 C)    TempSrc:  Oral    Resp: 13 13  14   Height:      Weight:      SpO2:  99%  100%   Weight change: -2.394 kg (-5 lb 4.5 oz)  Physical Exam: General: alert / pleasant AAM/NAD Heart: Rare irreg. Rate 60 / 2/6 sem , no rub Lungs: CTA Abdomen: bs pos. Soft , NT, ND Extremities: LBKA and R AKA no stump edema Dialysis Access: pos bruit LFA AVF   OP Dialysis: [facility] MWF 3.5h 56.5kg 2/2.25 bath P4 Hep none LFA AVF Mircera 150 mcg IV q 2 weeks (last dose 02/21/16 100 mcg IV HGB 10.2 03/09/16) Calcitriol 1.75 mcg IV q MWF (Last PTH 651 02/07/16)  Problem/Plan: 1. Lower GI bleed - Hb stable. s/p 9 units prbc's this admission; capsule endo showed R colon bleed. Last bloody stool 6/18. Last transfused 6/17. Per Dr. Cristina Gong d/w with his daughter today  "avoid surgery if at all possible",  GI believes is a second diverticular  bleed (the first episode was within the past year and was less severe). 2. Anemia ABL/ CKD- Darbe 100/ wk here (last 6/15) 3. HTN  Am 109/59  Now on about 1/2 of his home meds , low dose coreg/ norvasc, stable 4. Vol - yest HD 3.3 l uf to 57.3 kg wt  Only 1 kg above edw but ?  if wts are accurate.  5. MBD Calcitriol/sevelamer. Last phos was 2.4 on 6/16. Now phos  6.4/ ca corec 10.3 / use 2.0 ca bath fu ca phos pre hd  6. PAD bilat amputee (L AK, R BK) 7. Hx CVA 8. CM EF 30%/ aortic stenosis 9. Hx known CAD w chron stable angina on med Rx   Ernest Haber, PA-C [place] [phone] 03/12/2016,11:31 AM  LOS: 9 days    I have seen and examined this patient and agree with plan as outlined. LGIB appears to have stopped, Hb stable past 48 hours, last transfused  6/17. Diet advancing. HD tomorrow.  Caelie Remsburg B,MD 03/12/2016 12:23 PM  Labs:  Recent Labs Lab 03/06/16 1851  03/08/16 1515  03/10/16 0322 03/11/16 0730 03/12/16 0508  NA 131*  < > 135  < > 132* 126* 133*  K 4.3  < > 3.9  < > 5.3* 5.4* 4.6  CL 101  < > 101  < > 96* 96* 96*  CO2 19*  < > 28  < > 26 21* 27  GLUCOSE 145*  < > 196*  < > 89 90 82  BUN 57*  < > 8  < > 45* 80* 36*  CREATININE 8.40*  < > 2.60*  < > 6.03* 8.23* 5.20*  CALCIUM 7.8*  < > 8.0*  < > 8.4* 7.9* 8.9  PHOS 8.0*  --  2.4*  --   --   --  6.4*  < > = values in this interval not displayed.   Recent Labs Lab 03/06/16 1851 03/08/16 1515 03/12/16 0508  ALBUMIN 2.0* 2.3* 2.3*     Recent Labs Lab 03/06/16 1252 03/06/16 1535  03/08/16 1515  03/10/16 1527 03/10/16 1834 03/11/16 0730 03/12/16  0508  WBC 5.3 4.2  --  4.8  --  6.8  --   --  6.3  NEUTROABS 3.6 2.9  --   --   --   --   --   --   --   HGB 6.5* 5.8*  < > 9.6*  < > 9.0* 9.7* 8.6* 9.4*  HCT 20.0* 18.0*  < > 29.6*  < > 28.4* 31.1* 27.0* 29.8*  MCV 84.4 84.9  --  86.8  --  90.2  --   --  92.5  PLT 128* 115*  --  137*  --  189  --   --  201  < > = values in this interval not displayed.   Recent Labs Lab 03/07/16 1841 03/08/16 0019 03/08/16 0930  TROPONINI 0.44* 0.49* 0.49*     Recent Labs Lab 03/11/16 0413 03/11/16 1242 03/11/16 1603 03/11/16 2250 03/12/16 0737  GLUCAP 82 133* 129* 214* 88    Medications:   . amLODipine  5 mg Oral Daily  . atorvastatin  80 mg Oral q1800  . brinzolamide  1 drop Both Eyes BID   And  . brimonidine  1 drop Both Eyes BID  . calcitRIOL  1.5 mcg Oral Q M,W,F-HD  . carvedilol  6.25 mg Oral BID WC  . [START ON 03/15/2016] darbepoetin (ARANESP) injection - DIALYSIS  100 mcg Intravenous Q Fri-HD  . feeding supplement  1 Container Oral TID WC & HS  . feeding supplement (PRO-STAT SUGAR FREE 64)  30 mL Oral BID  . insulin aspart  0-5 Units Subcutaneous QHS  . insulin aspart  0-9 Units Subcutaneous  TID WC  . isosorbide mononitrate  15 mg Oral QHS  . latanoprost  1 drop Both Eyes QHS  . multivitamin  1 tablet Oral Daily  . pantoprazole  40 mg Oral QAC breakfast  . polyethylene glycol  17 g Oral BID  . sevelamer carbonate  800 mg Oral With snacks  . sodium chloride flush  3 mL Intravenous Q12H

## 2016-03-12 NOTE — Progress Notes (Signed)
No BM's (per pt).  On Miralax to hopefully purge old blood.  Hgb slt improved.  Spoke w/ pt's dtr [name]) on phone this morning re findings of recent capsule (she had questions about it).  She agrees with me that it is best to avoid surgery if at all possible, for what we believe is a second-time diverticular bleed (the first episode was within the past year and was less severe).  Will continue observation and advance diet.  Cleotis Nipper, M.D. Pager [phone] If no answer or after 5 PM call [phone]

## 2016-03-12 NOTE — Progress Notes (Signed)
PROGRESS NOTE    Randy [name]  [id number] DOB: July 26, 1932 DOA: 03/03/2016 PCP: [name], MD   Brief Narrative:  82 y.o. BM PMHx CVA, ESRD on HD (M, W, F) , HTN,Nonischemic cardiomyopathy LVEF 35-40% , Myocarditis, PVD, CAD multiple native artery, HLD, Hypertension, Anemia, S/P Right BKA, S/P left AKA  Apparently c/o bleeding per rectum starting this afternoon. Pt states that there was blood on the toilet paper and the toilet bowel. +n/v x1, No blood. Denies fever, chills, abd pain, diarrhea, black stool.    Assessment & Plan:   Principal Problem:   GI bleeding Active Problems:   ESRD on dialysis [facility])   Essential hypertension   GI bleed   Hyperglycemia   Coronary artery disease involving native coronary artery of native heart with angina pectoris (HCC)   Hyperlipidemia   Cardiomyopathy, ischemic   Aortic stenosis   Gastrointestinal hemorrhage associated with anorectal source   CAD in native artery   Hypertriglyceridemia   Hypokalemia   Hypomagnesemia   Lower GI bleed -6/14 Transfuse 2 units PRBC - Patient being worked up by GI, they have ordered nuclear bleeding scan and if positive probable angiogram by interventional radiology. Will await recommendations -H/H QID -Patient continues to bleed overnight hemoglobin drop from 8.7 on 6/15 post transfusion to 6.4 AM 6/15.  -6/15 Transfuse 2 units PRBC -6/17 transfuse 1 unit PRBC  Recent Labs Lab 03/10/16 1256 03/10/16 1527 03/10/16 1834 03/11/16 0730 03/12/16 0508  HGB 8.7* 9.0* 9.7* 8.6* 9.4*  -6/16 S/P Capsule endoscopy right colon bleed see results below. Per GI continue clear liquids for another 24 hours and if bleeding continues or future right colon bleeding consider angiogram with therapy or right hemicolectomy  -6/20 hemoglobin holding stable. Patient's diet will be advanced and will monitor to determine if bleeding has fully stopped. Per GI note prefer to avoid surgery.    ESRD on HD  M/W/F -Per nephrology  Cardiomyopathy, ischemic -Strict in and outSince admission - 2.8 L -Daily weight  Filed Weights   03/11/16 0715 03/11/16 1120 03/12/16 0400  Weight: 61.2 kg (134 lb 14.7 oz) 57.3 kg (126 lb 5.2 oz) 59.8 kg (131 lb 13.4 oz)  -Transfuse for hemoglobin<8  Aortic stenosis   HTN -Will allow permissive hypertension in order to facilitate HD and secondary to patient's ongoing GI bleed -Amlodipine 5 mg daily -Coreg 6.25 mg BID; patient's BP on the soft side. -Imdur 15 mg QHS  CAD native artery with angina/waxing and waning CP -Most likely secondary to patient's acute anemia--> demand ischemia -See hypertension -NTG PRN  -See cardiomyopathy -Echocardiogram; compared to echocardiogram 08/04/2015 EF improved  Hyperglycemia -Sensitive SSI  HLD/Hypertriglyceridemia -Lipitor 80 mg daily -Lipid panel; not within AHA guidelines -Will Add Niaspan, when patient able to take aspirin to prevent side effects   Right BKA/Left AKA -Stable  Hypokalemia -Potassium goal> 4  Hypomagnesemia -Magnesium goal>2     DVT prophylaxis: SCD Code Status: Full Family Communication: None Disposition Plan: Per GI   Consultants:  Dr.Robert Schertz nephrology Dr.Marc Magod GI   Procedures/Significant Events:  6/12 transfused 2 units PRBC 6/13 transfused 2 units PRBC 6/13 colonoscopy;ascending colon polyps x8 , diverticulosis in ascending colon and descending colon and sigmoid colon, cecal polyp x1, blood in ileum, rectal polyp x1 6/13 EGD;hiatal hernia, schatzki's ring, mild gastritis 6/14 NM RBC scan; no clear-cut bleeding source. 6/14 Transfuse 2 units PRBC 6/15 Transfuse 2 units PRBC 6/16 echocardiogram;- Left ventricle: posterior lateral hypokinesis.- moderately dilated. -LVEF=45% to 50%.  -  Aortic valve:  moderate stenosis. - Left atrium:  moderately dilated. 6/16 Capsule Endoscopy; Almost certainly right colon bleeding from diverticuli 6/17 transfuse 1 unit  PRBC  Cultures NA  Antimicrobials: NA   Devices NA   LINES / TUBES:  NA    Continuous Infusions:    Subjective: 6/20 A/O 4, NAD. Negative abdominal pain, negative CP, negative SOB. States negative bright red blood per rectum last 24 hours.    Objective: Filed Vitals:   03/12/16 0600 03/12/16 0745 03/12/16 0819 03/12/16 1000  BP: 129/55 129/55 148/65 109/59  Pulse:  57 70 58  Temp:  97.8 F (36.6 C)    TempSrc:  Oral    Resp: 13 13  14   Height:      Weight:      SpO2:  99%  100%    Intake/Output Summary (Last 24 hours) at 03/12/16 1250 Last data filed at 03/11/16 2100  Gross per 24 hour  Intake    730 ml  Output      0 ml  Net    730 ml   Filed Weights   03/11/16 0715 03/11/16 1120 03/12/16 0400  Weight: 61.2 kg (134 lb 14.7 oz) 57.3 kg (126 lb 5.2 oz) 59.8 kg (131 lb 13.4 oz)    Examination:  General: A/O 4, NAD, No acute respiratory distress Eyes: negative scleral hemorrhage, negative anisocoria, negative icterus ENT: Negative Runny nose, negative gingival bleeding, Neck:  Negative scars, masses, torticollis, lymphadenopathy, JVD Lungs: Clear to auscultation bilaterally without wheezes or crackles Cardiovascular: Regular rate and rhythm without murmur gallop or rub normal S1 and S2 Abdomen: negative abdominal pain, nondistended, positive soft, bowel sounds, no rebound, no ascites, no appreciable mass Extremities: No significant cyanosis. Right BKA stable, Negative Left AKA edema Skin: Negative rashes, lesions, ulcers Psychiatric:  Negative depression, negative anxiety, negative fatigue, negative mania  Central nervous system:  Cranial nerves II through XII intact, tongue/uvula midline, all extremities muscle strength 5/5, sensation intact throughout, finger nose finger bilateral within normal limits, quick finger touch bilateral within normal limits, negative dysarthria, negative expressive aphasia, negative receptive aphasia.  .     Data  Reviewed: Care during the described time interval was provided by me .  I have reviewed this patient's available data, including medical history, events of note, physical examination, and all test results as part of my evaluation. I have personally reviewed and interpreted all radiology studies.  CBC:  Recent Labs Lab 03/06/16 1252 03/06/16 1535  03/08/16 1515  03/10/16 1256 03/10/16 1527 03/10/16 1834 03/11/16 0730 03/12/16 0508  WBC 5.3 4.2  --  4.8  --   --  6.8  --   --  6.3  NEUTROABS 3.6 2.9  --   --   --   --   --   --   --   --   HGB 6.5* 5.8*  < > 9.6*  < > 8.7* 9.0* 9.7* 8.6* 9.4*  HCT 20.0* 18.0*  < > 29.6*  < > 27.6* 28.4* 31.1* 27.0* 29.8*  MCV 84.4 84.9  --  86.8  --   --  90.2  --   --  92.5  PLT 128* 115*  --  137*  --   --  189  --   --  201  < > = values in this interval not displayed. Basic Metabolic Panel:  Recent Labs Lab 03/06/16 1851 03/07/16 0159 03/08/16 0019 03/08/16 1515 03/09/16 0600 03/10/16 0322 03/11/16 0203 03/11/16 0730 03/12/16  0508  NA 131* 135 131* 135 133* 132*  --  126* 133*  K 4.3 3.4* 4.0 3.9 4.5 5.3*  --  5.4* 4.6  CL 101 100* 98* 101 99* 96*  --  96* 96*  CO2 19* 30 25 28 28 26   --  21* 27  GLUCOSE 145* 136* 126* 196* 102* 89  --  90 82  BUN 57* 16 37* 8 26* 45*  --  80* 36*  CREATININE 8.40* 3.40* 5.22* 2.60* 4.46* 6.03*  --  8.23* 5.20*  CALCIUM 7.8* 7.8* 7.9* 8.0* 8.3* 8.4*  --  7.9* 8.9  MG  --  1.7 2.2  --  1.9 1.9 1.8  --   --   PHOS 8.0*  --   --  2.4*  --   --   --   --  6.4*   GFR: Estimated Creatinine Clearance: 7.7 mL/min (by C-G formula based on Cr of 5.2). Liver Function Tests:  Recent Labs Lab 03/06/16 1851 03/08/16 1515 03/12/16 0508  ALBUMIN 2.0* 2.3* 2.3*   No results for input(s): LIPASE, AMYLASE in the last 168 hours. No results for input(s): AMMONIA in the last 168 hours. Coagulation Profile: No results for input(s): INR, PROTIME in the last 168 hours. Cardiac Enzymes:  Recent Labs Lab  03/07/16 1841 03/08/16 0019 03/08/16 0930  TROPONINI 0.44* 0.49* 0.49*   BNP (last 3 results) No results for input(s): PROBNP in the last 8760 hours. HbA1C: No results for input(s): HGBA1C in the last 72 hours. CBG:  Recent Labs Lab 03/11/16 0413 03/11/16 1242 03/11/16 1603 03/11/16 2250 03/12/16 0737  GLUCAP 82 133* 129* 214* 88   Lipid Profile: No results for input(s): CHOL, HDL, LDLCALC, TRIG, CHOLHDL, LDLDIRECT in the last 72 hours. Thyroid Function Tests: No results for input(s): TSH, T4TOTAL, FREET4, T3FREE, THYROIDAB in the last 72 hours. Anemia Panel: No results for input(s): VITAMINB12, FOLATE, FERRITIN, TIBC, IRON, RETICCTPCT in the last 72 hours. Urine analysis:    Component Value Date/Time   COLORURINE YELLOW 06/22/2014 [place] 06/22/2014 0531   LABSPEC 1.015 06/22/2014 0531   PHURINE 6.0 06/22/2014 0531   GLUCOSEU 100* 06/22/2014 0531   HGBUR SMALL* 06/22/2014 0531   HGBUR trace-lysed 06/03/2008 0855   BILIRUBINUR NEGATIVE 06/22/2014 0531   KETONESUR TRACE* 06/22/2014 0531   PROTEINUR 100* 06/22/2014 0531   UROBILINOGEN 0.2 06/22/2014 0531   NITRITE NEGATIVE 06/22/2014 0531   LEUKOCYTESUR NEGATIVE 06/22/2014 0531   Sepsis Labs: @LABRCNTIP (procalcitonin:4,lacticidven:4)  ) Recent Results (from the past 240 hour(s))  MRSA PCR Screening     Status: None   Collection Time: 03/03/16 10:09 PM  Result Value Ref Range Status   MRSA by PCR NEGATIVE NEGATIVE Final    Comment:        The GeneXpert MRSA Assay (FDA approved for NASAL specimens only), is one component of a comprehensive MRSA colonization surveillance program. It is not intended to diagnose MRSA infection nor to guide or monitor treatment for MRSA infections.          Radiology Studies: No results found.      Scheduled Meds: . amLODipine  5 mg Oral Daily  . atorvastatin  80 mg Oral q1800  . brinzolamide  1 drop Both Eyes BID   And  . brimonidine  1 drop  Both Eyes BID  . calcitRIOL  1.5 mcg Oral Q M,W,F-HD  . carvedilol  6.25 mg Oral BID WC  . [START ON 03/15/2016] darbepoetin (ARANESP) injection -  DIALYSIS  100 mcg Intravenous Q Fri-HD  . feeding supplement  1 Container Oral TID WC & HS  . feeding supplement (PRO-STAT SUGAR FREE 64)  30 mL Oral BID  . insulin aspart  0-5 Units Subcutaneous QHS  . insulin aspart  0-9 Units Subcutaneous TID WC  . isosorbide mononitrate  15 mg Oral QHS  . latanoprost  1 drop Both Eyes QHS  . multivitamin  1 tablet Oral Daily  . pantoprazole  40 mg Oral QAC breakfast  . polyethylene glycol  17 g Oral BID  . sevelamer carbonate  800 mg Oral TID WC  . sodium chloride flush  3 mL Intravenous Q12H   Continuous Infusions:    LOS: 9 days    Time spent: 40 minutes    Kristoffer Bala, Geraldo Docker, MD [facility] Pager [phone]   If 7PM-7AM, please contact night-coverage [email or web address] Password [email or web address] 03/12/2016, 12:50 PM

## 2016-03-13 LAB — RENAL FUNCTION PANEL
ALBUMIN: 2.1 g/dL — AB (ref 3.5–5.0)
Anion gap: 13 (ref 5–15)
BUN: 60 mg/dL — AB (ref 6–20)
CHLORIDE: 95 mmol/L — AB (ref 101–111)
CO2: 24 mmol/L (ref 22–32)
CREATININE: 7.51 mg/dL — AB (ref 0.61–1.24)
Calcium: 8.5 mg/dL — ABNORMAL LOW (ref 8.9–10.3)
GFR, EST AFRICAN AMERICAN: 7 mL/min — AB (ref >60)
GFR, EST NON AFRICAN AMERICAN: 6 mL/min — AB (ref >60)
Glucose, Bld: 66 mg/dL (ref 65–99)
PHOSPHORUS: 7.7 mg/dL — AB (ref 2.5–4.6)
Potassium: 5.3 mmol/L — ABNORMAL HIGH (ref 3.5–5.1)
Sodium: 132 mmol/L — ABNORMAL LOW (ref 135–145)

## 2016-03-13 LAB — CBC
HEMATOCRIT: 26.6 % — AB (ref 39.0–52.0)
Hemoglobin: 8.3 g/dL — ABNORMAL LOW (ref 13.0–17.0)
MCH: 28.3 pg (ref 26.0–34.0)
MCHC: 31.2 g/dL (ref 30.0–36.0)
MCV: 90.8 fL (ref 78.0–100.0)
PLATELETS: 212 10*3/uL (ref 150–400)
RBC: 2.93 MIL/uL — AB (ref 4.22–5.81)
RDW: 17.4 % — AB (ref 11.5–15.5)
WBC: 6 10*3/uL (ref 4.0–10.5)

## 2016-03-13 LAB — GLUCOSE, CAPILLARY
GLUCOSE-CAPILLARY: 180 mg/dL — AB (ref 65–99)
GLUCOSE-CAPILLARY: 134 mg/dL — AB (ref 65–99)
Glucose-Capillary: 195 mg/dL — ABNORMAL HIGH (ref 65–99)

## 2016-03-13 MED ORDER — AMLODIPINE BESYLATE 5 MG PO TABS
5.0000 mg | ORAL_TABLET | Freq: Every day | ORAL | Status: DC
  Administered 2016-03-13: 5 mg via ORAL
  Filled 2016-03-13: qty 1

## 2016-03-13 MED ORDER — CARVEDILOL 3.125 MG PO TABS
3.1250 mg | ORAL_TABLET | Freq: Two times a day (BID) | ORAL | Status: DC
  Administered 2016-03-13 – 2016-03-14 (×2): 3.125 mg via ORAL
  Filled 2016-03-13 (×2): qty 1

## 2016-03-13 MED ORDER — CALCITRIOL 0.5 MCG PO CAPS
ORAL_CAPSULE | ORAL | Status: AC
  Administered 2016-03-13: 1.5 ug via ORAL
  Filled 2016-03-13: qty 3

## 2016-03-13 MED ORDER — SEVELAMER CARBONATE 800 MG PO TABS
1600.0000 mg | ORAL_TABLET | Freq: Three times a day (TID) | ORAL | Status: DC
  Administered 2016-03-13 – 2016-03-14 (×4): 1600 mg via ORAL
  Filled 2016-03-13 (×4): qty 2

## 2016-03-13 NOTE — Procedures (Signed)
I have personally attended this patient's dialysis session.   Goal 3 liters BP stable thus far K 5.3 2K bath No heparin Hb 9.4->8.3  Jamal Maes, MD [facility] [phone] Pager 03/13/2016, 8:43 AM

## 2016-03-13 NOTE — Progress Notes (Addendum)
[facility] Rounding Note  Subjective:  Says no more bleeding Seen in HD No complaints at all today Last transfusion 6/16 (rec'd 9 units between 6/12 and 6/17)  Objective Vital signs in last 24 hours: Filed Vitals:   03/12/16 1948 03/13/16 0602 03/13/16 0738 03/13/16 0748  BP: 118/43 116/50 148/82 119/60  Pulse: 55 50 63 51  Temp: 98.2 F (36.8 C) 98.1 F (36.7 C) 97.3 F (36.3 C)   TempSrc: Oral Oral Oral   Resp: 16 16 21    Height:      Weight:  60.328 kg (133 lb) 59.9 kg (132 lb 0.9 oz)   SpO2: 100% 98% 98%    Weight change: -1.597 kg (-3 lb 8.3 oz)  Physical Exam: Very nice AAM Seen in the HD unit BP 119/60 mmHg  Pulse 51  Temp(Src) 97.3 F (36.3 C) (Oral)  Resp 21  Ht 4\' 9"  (1.448 m)  Wt 59.9 kg (132 lb 0.9 oz)  BMI 28.57 kg/m2  SpO2 98% Oriented X 3 Lungs ant clear S21S2 No S3 2/6 murmur LSB Bilateral amputations No edema stumps L AVF cannulated for HD at the present time    Labs:  Recent Labs Lab 03/08/16 1515  03/11/16 0730 03/12/16 0508 03/13/16 0650  NA 135  < > 126* 133* 132*  K 3.9  < > 5.4* 4.6 5.3*  CL 101  < > 96* 96* 95*  CO2 28  < > 21* 27 24  GLUCOSE 196*  < > 90 82 66  BUN 8  < > 80* 36* 60*  CREATININE 2.60*  < > 8.23* 5.20* 7.51*  CALCIUM 8.0*  < > 7.9* 8.9 8.5*  PHOS 2.4*  --   --  6.4* 7.7*  < > = values in this interval not displayed.   Recent Labs Lab 03/08/16 1515 03/12/16 0508 03/13/16 0650  ALBUMIN 2.3* 2.3* 2.1*     Recent Labs Lab 03/06/16 1252 03/06/16 1535  03/08/16 1515  03/10/16 1527  03/11/16 0730 03/12/16 0508 03/13/16 0658  WBC 5.3 4.2  --  4.8  --  6.8  --   --  6.3 6.0  NEUTROABS 3.6 2.9  --   --   --   --   --   --   --   --   HGB 6.5* 5.8*  < > 9.6*  < > 9.0*  < > 8.6* 9.4* 8.3*  HCT 20.0* 18.0*  < > 29.6*  < > 28.4*  < > 27.0* 29.8* 26.6*  MCV 84.4 84.9  --  86.8  --  90.2  --   --  92.5 90.8  PLT 128* 115*  --  137*  --  189  --   --  201 212  < > = values in this interval not  displayed.   Recent Labs Lab 03/07/16 1841 03/08/16 0019 03/08/16 0930  TROPONINI 0.44* 0.49* 0.49*     Recent Labs Lab 03/11/16 2250 03/12/16 0737 03/12/16 1238 03/12/16 1700 03/12/16 2201  GLUCAP 214* 88 105* 151* 136*    Medications:   . amLODipine  5 mg Oral Daily  . atorvastatin  80 mg Oral q1800  . brinzolamide  1 drop Both Eyes BID   And  . brimonidine  1 drop Both Eyes BID  . calcitRIOL  1.5 mcg Oral Q M,W,F-HD  . carvedilol  6.25 mg Oral BID WC  . [START ON 03/15/2016] darbepoetin (ARANESP) injection - DIALYSIS  100 mcg Intravenous Q Fri-HD  .  feeding supplement  1 Container Oral TID WC & HS  . feeding supplement (PRO-STAT SUGAR FREE 64)  30 mL Oral BID  . insulin aspart  0-5 Units Subcutaneous QHS  . insulin aspart  0-9 Units Subcutaneous TID WC  . isosorbide mononitrate  15 mg Oral QHS  . latanoprost  1 drop Both Eyes QHS  . multivitamin  1 tablet Oral Daily  . pantoprazole  40 mg Oral QAC breakfast  . polyethylene glycol  17 g Oral BID  . sevelamer carbonate  800 mg Oral TID WC  . sodium chloride flush  3 mL Intravenous Q12H   OP Dialysis: [facility] MWF 3.5h 56.5kg 2/2.25 bath P4 Hep none LFA AVF Mircera 150 mcg IV q 2 weeks (last dose 02/21/16 100 mcg IV HGB 10.2 03/09/16) Calcitriol 1.75 mcg IV q MWF (Last PTH 651 02/07/16)  Problem/Plan: 1. Lower GI bleed - Hb 9.4->8.3 last 24 hours. s/p 9 units prbc's this admission (between 6/12 and 6/17) Capsule endo showed R colon bleed. Last bloody stool 6/18. Last transfused 6/17. Diet advancing. Trying to avoid surgery if at all possible. This is felt to be 2nd diverticular bleed.  2. Anemia ABL/ CKD- Darbe 100/ wk here (last 6/15) 3. HTN - BP's soft side. BP meds have been reduced this admission. Now just amlodipine and carvedilol both low dose. 4. Vol - Unclear if weights accurate (bed weights - so prob not). EDW 56.5. Pre weight today 59.9 kg.  5. MBD Calcitriol/sevelamer. Phos up to 7.7 Increase  sevelamer to 2ac 6. PAD bilat amputee (L AK, R BK) 7. Hx CVA 8. CM EF 30%/ aortic stenosis 9. Hx known CAD w chron stable angina on med Rx   Jamal Maes, MD [facility] [phone] Pager 03/13/2016, 8:36 AM

## 2016-03-13 NOTE — Progress Notes (Signed)
Patient seen while on dialysis earlier today.  Per patient and per RN, no further bowel movements. Apparently, he had a small one yesterday.  Hemoglobin has dropped about a gram, but this is probably related to predialysis intravascular fluid retention, since there has been an up and down trend in his hemoglobin in association with his dialysis days.  Impression: Doubt that the patient is having further bleeding. He most likely had a proximal colonic diverticular bleed, although there was (?refluxed?) blood noted in his TI at the time of colonoscopy up in [place]; however, capsule endoscopy did not show a source of bleeding in the distal small bowel.  Plan: Okay for discharge from GI tract standpoint at any time. Will sign off, please call if we can be of further assistance in this patient's care. I do not feel that further GI evaluation is needed.  The main question is whether to keep him off his aspirin. I would probably do so for at least the next couple of weeks. Thereafter, a judgment call will have to be made by his cardiologist, whether recurring severe bleeding episodes, or the absence of the antiplatelet effect of this aspirin, would pose the greater risk to his health and longevity.  Cleotis Nipper, M.D. Pager [phone] If no answer or after 5 PM call [phone]

## 2016-03-13 NOTE — Progress Notes (Signed)
[facility] Progress Note  Patient: Randy [name] [id number]   PCP: [name], MD DOB: [birth year]   DOA: 03/03/2016   DOS: 03/13/2016   Date of Service: the patient was seen and examined on 03/13/2016  Subjective: Patient denies any active bleeding. No abdominal pain. No nausea and vomiting. No chest pain or shortness of breath. Nutrition: Tolerating oral diet  Brief hospital course: Pt. with PMH of CVA, ESRD on HD, NICM, CHF, EF 35-40%, PVD, right BKA, left AKA, CAD, HTN; admitted on 03/03/2016, with complaint of BRBPR, was found to have GI bleed from diverticular source. Admitted in stepdown unit received multiple PRBC transfusion. Currently further plan is continue monitoring H&H.  Assessment and Plan: 1. GI bleeding Status post multiple units of PRBC transfusion. Positive angiogram by IR, capsule endoscopy noted right colonic bleed. EGD and colonoscopy earlier in the stay without any evidence of acute source. At present H&H is stable. Appreciate GI consultation. No further workup planned. We will start on iron supplementation starting 03/14/2016. GI recommends to avoid aspirin for a few weeks.  2. Coronary artery disease, CABG, moderate aortic stenosis, ischemic cardiomyopathy V is EF 35%. Elevated troponin with [place] ischemia. Cardiology was consulted earlier and recommended to continue aspirin Coreg Norvasc and Imdur. At present we will hold off aspirin for a few weeks as recommended by GI. Patient will follow up with cardiology in 2 weeks regarding further discussion of continuation of aspirin versus stopping it indefinitely due to life-threatening GI bleed.  3. ESRD on hemodialysis. Continue HD. No evidence of volume overload.  4. Bilateral amputation. Physical therapy consulted. We will follow recommendation.  5. Hypokalemia. Hypokalemia. Hypomagnesemia. Hyperglycemia. All well controlled. We will continue to monitor.  Pain management: Continuing  home OxyIR Activity: Consulted physical therapy Bowel regimen: last BM 03/12/2016 Diet: Renal diet DVT Prophylaxis: mechanical compression device.  Advance goals of care discussion: Full code  Family Communication: nofamily was present at bedside, at the time of interview.    Disposition:  Discharge to home with home health. Expected discharge date: 03/14/2016, stabilization of H&H  Consultants: Cardiology, gastroenterology, nephrology Procedures: Hemodialysis, nuclear RBC scan  Antibiotics: Anti-infectives    None        Intake/Output Summary (Last 24 hours) at 03/13/16 2003 Last data filed at 03/13/16 1913  Gross per 24 hour  Intake    363 ml  Output   3005 ml  Net  -2642 ml   Filed Weights   03/13/16 0738 03/13/16 1122 03/13/16 1952  Weight: 59.9 kg (132 lb 0.9 oz) 57.1 kg (125 lb 14.1 oz) 60.3 kg (132 lb 15 oz)    Objective: Physical Exam: Filed Vitals:   03/13/16 1122 03/13/16 1151 03/13/16 1646 03/13/16 1952  BP: 106/55 132/56 116/52 116/55  Pulse: 66 64 65 63  Temp:  98.1 F (36.7 C) 98.7 F (37.1 C) 98.7 F (37.1 C)  TempSrc: Oral Oral Oral   Resp: 13 18 19 18   Height:      Weight: 57.1 kg (125 lb 14.1 oz)   60.3 kg (132 lb 15 oz)  SpO2: 98% 99% 99% 98%    General: Alert, Awake and Oriented to Time, Place and Person. Appear in mild distress Eyes: PERRL, Conjunctiva normal ENT: Oral Mucosa clear moist. Neck: no JVD, no Abnormal Mass Or lumps Cardiovascular: S1 and S2 Present, aortic systolic Murmur, Respiratory: Bilateral Air entry equal and Decreased, Clear to Auscultation, no Crackles, no wheezes Abdomen: Bowel Sound present, Soft and no tenderness Skin:  no redness, no Rash  Extremities: no Pedal edema, no calf tenderness Neurologic: Grossly no focal neuro deficit. Bilaterally Equal motor strength  Data Reviewed: CBC:  Recent Labs Lab 03/08/16 1515  03/10/16 1527 03/10/16 1834 03/11/16 0730 03/12/16 0508 03/13/16 0658  WBC 4.8  --   6.8  --   --  6.3 6.0  HGB 9.6*  < > 9.0* 9.7* 8.6* 9.4* 8.3*  HCT 29.6*  < > 28.4* 31.1* 27.0* 29.8* 26.6*  MCV 86.8  --  90.2  --   --  92.5 90.8  PLT 137*  --  189  --   --  201 212  < > = values in this interval not displayed. Basic Metabolic Panel:  Recent Labs Lab 03/07/16 0159 03/08/16 0019 03/08/16 1515 03/09/16 0600 03/10/16 0322 03/11/16 0203 03/11/16 0730 03/12/16 0508 03/13/16 0650  NA 135 131* 135 133* 132*  --  126* 133* 132*  K 3.4* 4.0 3.9 4.5 5.3*  --  5.4* 4.6 5.3*  CL 100* 98* 101 99* 96*  --  96* 96* 95*  CO2 30 25 28 28 26   --  21* 27 24  GLUCOSE 136* 126* 196* 102* 89  --  90 82 66  BUN 16 37* 8 26* 45*  --  80* 36* 60*  CREATININE 3.40* 5.22* 2.60* 4.46* 6.03*  --  8.23* 5.20* 7.51*  CALCIUM 7.8* 7.9* 8.0* 8.3* 8.4*  --  7.9* 8.9 8.5*  MG 1.7 2.2  --  1.9 1.9 1.8  --   --   --   PHOS  --   --  2.4*  --   --   --   --  6.4* 7.7*    Liver Function Tests:  Recent Labs Lab 03/08/16 1515 03/12/16 0508 03/13/16 0650  ALBUMIN 2.3* 2.3* 2.1*   No results for input(s): LIPASE, AMYLASE in the last 168 hours. No results for input(s): AMMONIA in the last 168 hours. Coagulation Profile: No results for input(s): INR, PROTIME in the last 168 hours. Cardiac Enzymes:  Recent Labs Lab 03/07/16 1841 03/08/16 0019 03/08/16 0930  TROPONINI 0.44* 0.49* 0.49*   BNP (last 3 results) No results for input(s): PROBNP in the last 8760 hours.  CBG:  Recent Labs Lab 03/12/16 1238 03/12/16 1700 03/12/16 2201 03/13/16 1154 03/13/16 1649  GLUCAP 105* 151* 136* 180* 195*    Studies: No results found.   Scheduled Meds: . amLODipine  5 mg Oral QHS  . atorvastatin  80 mg Oral q1800  . brinzolamide  1 drop Both Eyes BID   And  . brimonidine  1 drop Both Eyes BID  . calcitRIOL  1.5 mcg Oral Q M,W,F-HD  . carvedilol  3.125 mg Oral BID WC  . [START ON 03/15/2016] darbepoetin (ARANESP) injection - DIALYSIS  100 mcg Intravenous Q Fri-HD  . feeding supplement   1 Container Oral TID WC & HS  . feeding supplement (PRO-STAT SUGAR FREE 64)  30 mL Oral BID  . insulin aspart  0-5 Units Subcutaneous QHS  . insulin aspart  0-9 Units Subcutaneous TID WC  . isosorbide mononitrate  15 mg Oral QHS  . latanoprost  1 drop Both Eyes QHS  . multivitamin  1 tablet Oral Daily  . pantoprazole  40 mg Oral QAC breakfast  . polyethylene glycol  17 g Oral BID  . sevelamer carbonate  1,600 mg Oral TID WC  . sodium chloride flush  3 mL Intravenous Q12H   Continuous Infusions:  PRN Meds: acetaminophen **OR** acetaminophen, gabapentin, nitroGLYCERIN, oxyCODONE, polyvinyl alcohol  Time spent: 30 minutes  Author: Berle Mull, MD [facility] Pager: [phone] 03/13/2016 8:03 PM  If 7PM-7AM, please contact night-coverage at [email or web address], password [facility]

## 2016-03-14 LAB — CBC
HCT: 27.5 % — ABNORMAL LOW (ref 39.0–52.0)
HEMOGLOBIN: 8.5 g/dL — AB (ref 13.0–17.0)
MCH: 28.4 pg (ref 26.0–34.0)
MCHC: 30.9 g/dL (ref 30.0–36.0)
MCV: 92 fL (ref 78.0–100.0)
PLATELETS: 220 10*3/uL (ref 150–400)
RBC: 2.99 MIL/uL — AB (ref 4.22–5.81)
RDW: 17.4 % — ABNORMAL HIGH (ref 11.5–15.5)
WBC: 5.7 10*3/uL (ref 4.0–10.5)

## 2016-03-14 LAB — GLUCOSE, CAPILLARY
GLUCOSE-CAPILLARY: 100 mg/dL — AB (ref 65–99)
Glucose-Capillary: 90 mg/dL (ref 65–99)

## 2016-03-14 LAB — RENAL FUNCTION PANEL
ANION GAP: 11 (ref 5–15)
Albumin: 2.1 g/dL — ABNORMAL LOW (ref 3.5–5.0)
BUN: 40 mg/dL — ABNORMAL HIGH (ref 6–20)
CHLORIDE: 95 mmol/L — AB (ref 101–111)
CO2: 27 mmol/L (ref 22–32)
CREATININE: 5.11 mg/dL — AB (ref 0.61–1.24)
Calcium: 8.9 mg/dL (ref 8.9–10.3)
GFR, EST AFRICAN AMERICAN: 11 mL/min — AB (ref >60)
GFR, EST NON AFRICAN AMERICAN: 9 mL/min — AB (ref >60)
Glucose, Bld: 151 mg/dL — ABNORMAL HIGH (ref 65–99)
POTASSIUM: 4.1 mmol/L (ref 3.5–5.1)
Phosphorus: 6.3 mg/dL — ABNORMAL HIGH (ref 2.5–4.6)
Sodium: 133 mmol/L — ABNORMAL LOW (ref 135–145)

## 2016-03-14 MED ORDER — PRO-STAT SUGAR FREE PO LIQD: 30.0000 mL | Freq: Two times a day (BID) | ORAL | Status: DC

## 2016-03-14 MED ORDER — POLYETHYLENE GLYCOL 3350 17 G PO PACK: 17.0000 g | PACK | Freq: Two times a day (BID) | ORAL | Status: DC

## 2016-03-14 MED ORDER — BOOST / RESOURCE BREEZE PO LIQD: 1.0000 | Freq: Three times a day (TID) | ORAL | Status: AC

## 2016-03-14 MED ORDER — CARVEDILOL 3.125 MG PO TABS: 3.1250 mg | ORAL_TABLET | Freq: Two times a day (BID) | ORAL | Status: DC

## 2016-03-14 NOTE — Discharge Summary (Signed)
[facility] Discharge Summary   Patient: Randy [name] [id number]   PCP: Randy Curet, MD DOB: [birth year]   Date of admission: 03/03/2016   Date of discharge:  03/14/2016    Discharge Diagnoses:  Principal Problem:   GI bleeding Active Problems:   ESRD on dialysis [facility])   Essential hypertension   GI bleed   Hyperglycemia   Coronary artery disease involving native coronary artery of native heart with angina pectoris ([place])   Hyperlipidemia   Cardiomyopathy, ischemic   Aortic stenosis   Gastrointestinal hemorrhage associated with anorectal source   CAD in native artery   Hypertriglyceridemia   Hypokalemia   Hypomagnesemia  Admitted From: home Disposition:  home  Recommendations for Outpatient Follow-up:  1. Please follow-up with PCP in one week with CBC 2. Please follow-up with cardiology as well as GI to decide regarding Resumption of Aspirin   Follow-up Information    Follow up with Randy Curet, MD. Schedule an appointment as soon as possible for a visit in 1 week.   Specialty:  Internal Medicine   Contact information:   [place] [address] [phone]       Follow up with Randy Dolly, MD. Schedule an appointment as soon as possible for a visit in 2 weeks.   Specialty:  Cardiology   Contact information:   [address] [place] [address] [phone]       Follow up with [facility] In 3 weeks.   Why:  for follow up   Contact information:   [place] [address] [phone]      Diet recommendation: Renal diet  Activity: The patient is advised to gradually reintroduce usual activities.  Discharge Condition: good  Code Status: full code  History of present illness: As per the H and P dictated on admission, "Randy [name] is a 82 y.o. male, with ESRD on HD (M, W, F) , Hypertension, Anemia, apparently c/o bleeding per rectum starting this afternoon. Pt states that  there was blood on the toilet paper and the toilet bowel. +n/v x1, No blood. Denies fever, chills, abd pain, diarrhea, black stool. "  Hospital Course:  Summary of his active problems in the hospital is as following. 1. Right colonic Diverticular bleed Status post 9 units of PRBC transfusion. Negative NM scan,  Positive capsule endoscopy, noted right colonic bleed. Colonoscopy also found right colonic source of bleeding from  diverticulosis EGD earlier in the stay without any evidence of acute source. At present H&H is stable. Appreciate GI consultation. No further workup planned.  GI recommends to avoid aspirin for a few weeks. Patient will follow up with cardiology as well as gastroenterology regarding resumption of aspirin  2. Coronary artery disease, CABG, moderate aortic stenosis, ischemic cardiomyopathy V is EF 35%. Elevated troponin with Demand ischemia. Cardiology was consulted earlier and recommended to continue aspirin Coreg Norvasc and Imdur. At present we will hold off aspirin for a few weeks as recommended by GI. Patient will follow up with cardiology in 2 weeks regarding further discussion of continuation of aspirin versus stopping it indefinitely due to life-threatening GI bleed. Discussed with Dr. Luther [name] Who agrees with holding aspirin at present  3. ESRD on hemodialysis. Continue HD. No evidence of volume overload.  4. Bilateral amputation. No acute complains   5. Hypokalemia. Hypokalemia. Hypomagnesemia. Hyperglycemia. All well controlled. We will continue to monitor.  6.essential hypertension. Mild evidence of Bradycardia on telemetry. Coreg dose was  reduced to 3.125  All other chronic medical condition were stable during the hospitalization.  Patient was seen by physical therapy, who recommended No outpatient therapy requirement. On the day of the discharge the patient's Hemoglobin was stable, and no other acute medical condition were reported by  patient. the patient was felt safe to be discharge at Home with Family.  Procedures and Results:  Hemodialysis  PRBC transfusion  Nuclear RBC scan   Consultations:  Nephrology  Cardiology  Gastroenterology  DISCHARGE MEDICATION: Current Discharge Medication List    START taking these medications   Details  Amino Acids-Protein Hydrolys (FEEDING SUPPLEMENT, PRO-STAT SUGAR FREE 64,) LIQD Take 30 mLs by mouth 2 (two) times daily. Qty: 900 mL, Refills: 0    feeding supplement (BOOST / RESOURCE BREEZE) LIQD Take 1 Container by mouth 4 (four) times daily -  with meals and at bedtime. Qty: 14 Container, Refills: 0    polyethylene glycol (MIRALAX / GLYCOLAX) packet Take 17 g by mouth 2 (two) times daily. Qty: 14 each, Refills: 0      CONTINUE these medications which have CHANGED   Details  carvedilol (COREG) 3.125 MG tablet Take 1 tablet (3.125 mg total) by mouth 2 (two) times daily with a meal. Qty: 60 tablet, Refills: 0      CONTINUE these medications which have NOT CHANGED   Details  amLODipine (NORVASC) 10 MG tablet Take 0.5 tablets (5 mg total) by mouth daily.    atorvastatin (LIPITOR) 80 MG tablet Take 1 tablet (80 mg total) by mouth daily. Qty: 90 tablet, Refills: 3    calcitRIOL (ROCALTROL) 0.5 MCG capsule Take 1 capsule (0.5 mcg total) by mouth daily. Qty: 30 capsule, Refills: 3    docusate sodium (COLACE) 100 MG capsule Take 2 capsules (200 mg total) by mouth daily. Qty: 10 capsule, Refills: 0    gabapentin (NEURONTIN) 100 MG capsule Take 1 capsule (100 mg total) by mouth at bedtime.    isosorbide mononitrate (IMDUR) 30 MG 24 hr tablet Take 0.5 tablets (15 mg total) by mouth at bedtime. Qty: 45 tablet, Refills: 3    lidocaine-prilocaine (EMLA) cream Apply 1 application topically every Monday, Wednesday, and Friday. For dialysis Refills: 10    LUMIGAN 0.01 % SOLN Place 1 drop into both eyes at bedtime. Refills: 11    multivitamin (RENA-VIT) TABS tablet  Take 1 tablet by mouth every Monday, Wednesday, and Friday. Takes on dialysis days    nitroGLYCERIN (NITROSTAT) 0.4 MG SL tablet Place 1 tablet (0.4 mg total) under the tongue every 5 (five) minutes as needed for chest pain. Qty: 30 tablet, Refills: 12    oxyCODONE (OXY IR/ROXICODONE) 5 MG immediate release tablet Take 1 tablet (5 mg total) by mouth every 6 (six) hours as needed for moderate pain or severe pain. Qty: 50 tablet, Refills: 0    pantoprazole (PROTONIX) 40 MG tablet Take 1 tablet (40 mg total) by mouth daily at 12 noon.    polyvinyl alcohol (LIQUIFILM TEARS) 1.4 % ophthalmic solution Place 1 drop into both eyes as needed for dry eyes. Qty: 15 mL, Refills: 0    sevelamer carbonate (RENVELA) 800 MG tablet Take 1,600-2,400 mg by mouth See admin instructions. Take 3 tablets (2400 mg) with meals and 2 tablets (1600 mg) with snacks    SIMBRINZA 1-0.2 % SUSP Place 1 drop into both eyes 2 (two) times daily. Refills: 11      STOP taking these medications     aspirin 81 MG tablet  hydrALAZINE (APRESOLINE) 25 MG tablet      lisinopril (PRINIVIL,ZESTRIL) 40 MG tablet        No Known Allergies  Discharge Exam: Filed Weights   03/13/16 0738 03/13/16 1122 03/13/16 1952  Weight: 59.9 kg (132 lb 0.9 oz) 57.1 kg (125 lb 14.1 oz) 57.1 kg (125 lb 14.1 oz)   Filed Vitals:   03/14/16 0403 03/14/16 0823  BP: 141/93 143/63  Pulse: 62 56  Temp: 98.2 F (36.8 C) 97.3 F (36.3 C)  Resp: 18 17   General: Appear in no distress, no Rash; Oral Mucosa moist. Cardiovascular: S1 and S2 Present, no Murmur, no JVD Respiratory: Bilateral Air entry present and Clear to Auscultation, no Crackles, no wheezes Abdomen: Bowel Sound present, Soft and no tenderness Extremities: Right BKA, left AKA Neurology: Grossly no focal neuro deficit.  The results of significant diagnostics from this hospitalization (including imaging, microbiology, ancillary and laboratory) are listed below for  reference.    Significant Diagnostic Studies: Nm Gi Blood Loss  03/06/2016  CLINICAL DATA:  Bleeding per rectum x4 days. EXAM: NUCLEAR MEDICINE GASTROINTESTINAL BLEEDING SCAN TECHNIQUE: Sequential abdominal images were obtained following intravenous administration of Tc-71m labeled red blood cells. RADIOPHARMACEUTICALS:  25.0 mCi Tc-35m in-vitro labeled red cells. COMPARISON:  Abdominal series 215 2017. CT 12/31/2014. Nuclear medicine scan 09/28/2014 and 09/24/2014. FINDINGS: Standard GI bleeding series obtained. Activity noted in the upper abdomen bilaterally and in the lower pelvis bilaterally most likely secondary to genitourinary normal activity. Perineal activity again noted. No clearcut GI bleed noted. IMPRESSION: Negative exam. Electronically Signed   By: Marcello Moores  Register   On: 03/06/2016 14:22    Microbiology: No results found for this or any previous visit (from the past 240 hour(s)).   Labs: CBC:  Recent Labs Lab 03/08/16 1515  03/10/16 1527 03/10/16 1834 03/11/16 0730 03/12/16 0508 03/13/16 0658 03/14/16 0524  WBC 4.8  --  6.8  --   --  6.3 6.0 5.7  HGB 9.6*  < > 9.0* 9.7* 8.6* 9.4* 8.3* 8.5*  HCT 29.6*  < > 28.4* 31.1* 27.0* 29.8* 26.6* 27.5*  MCV 86.8  --  90.2  --   --  92.5 90.8 92.0  PLT 137*  --  189  --   --  201 212 220  < > = values in this interval not displayed. Basic Metabolic Panel:  Recent Labs Lab 03/08/16 0019 03/08/16 1515 03/09/16 0600 03/10/16 0322 03/11/16 0203 03/11/16 0730 03/12/16 0508 03/13/16 0650 03/14/16 0524  NA 131* 135 133* 132*  --  126* 133* 132* 133*  K 4.0 3.9 4.5 5.3*  --  5.4* 4.6 5.3* 4.1  CL 98* 101 99* 96*  --  96* 96* 95* 95*  CO2 25 28 28 26   --  21* 27 24 27   GLUCOSE 126* 196* 102* 89  --  90 82 66 151*  BUN 37* 8 26* 45*  --  80* 36* 60* 40*  CREATININE 5.22* 2.60* 4.46* 6.03*  --  8.23* 5.20* 7.51* 5.11*  CALCIUM 7.9* 8.0* 8.3* 8.4*  --  7.9* 8.9 8.5* 8.9  MG 2.2  --  1.9 1.9 1.8  --   --   --   --   PHOS  --   2.4*  --   --   --   --  6.4* 7.7* 6.3*   Liver Function Tests:  Recent Labs Lab 03/08/16 1515 03/12/16 0508 03/13/16 0650 03/14/16 0524  ALBUMIN 2.3* 2.3* 2.1* 2.1*  Cardiac Enzymes:  Recent Labs Lab 03/07/16 1841 03/08/16 0019 03/08/16 0930  TROPONINI 0.44* 0.49* 0.49*   BNP (last 3 results)  Recent Labs  08/04/15 0625 10/21/15 0936  BNP 2452.0* 3090.9*   CBG:  Recent Labs Lab 03/13/16 1154 03/13/16 1649 03/13/16 1949 03/14/16 0826 03/14/16 1207  GLUCAP 180* 195* 134* 90 100*   Time spent: 30 minutes  Signed:  Antinette Keough  [facility]  03/14/2016  , 12:50 PM

## 2016-03-14 NOTE — Progress Notes (Signed)
[facility] Progress Note    Subjective:  "I don't know why I didn't get eggs...what's wrong with this place???" Denies abdominal pain, no bloody stools.  To be Dc'd home today.  Other than being unhappy with B'fast, no C/Os.   Objective Filed Vitals:   03/13/16 1151 03/13/16 1646 03/13/16 1952 03/14/16 0403  BP: 132/56 116/52 116/55 141/93  Pulse: 64 65 63 62  Temp: 98.1 F (36.7 C) 98.7 F (37.1 C) 98.7 F (37.1 C) 98.2 F (36.8 C)  TempSrc: Oral Oral Oral Oral  Resp: 18 19 18 18   Height:      Weight:   57.1 kg (125 lb 14.1 oz)   SpO2: 99% 99% 98% 100%   Physical Exam  Awake, alert, NAD [id number], II/VI systolic M. Prominent neck veins no JVD Lungs with BBS CTA A/P Abdomen soft nontender L BKA R AKA no LE edema. LFA AVF + bruit  Additional Objective   Recent Labs Lab 03/12/16 0508 03/13/16 0650 03/14/16 0524  NA 133* 132* 133*  K 4.6 5.3* 4.1  CL 96* 95* 95*  CO2 27 24 27   GLUCOSE 82 66 151*  BUN 36* 60* 40*  CREATININE 5.20* 7.51* 5.11*  CALCIUM 8.9 8.5* 8.9  PHOS 6.4* 7.7* 6.3*     Recent Labs Lab 03/12/16 0508 03/13/16 0650 03/14/16 0524  ALBUMIN 2.3* 2.1* 2.1*     Recent Labs Lab 03/08/16 1515  03/10/16 1527  03/12/16 0508 03/13/16 0658 03/14/16 0524  WBC 4.8  --  6.8  --  6.3 6.0 5.7  HGB 9.6*  < > 9.0*  < > 9.4* 8.3* 8.5*  HCT 29.6*  < > 28.4*  < > 29.8* 26.6* 27.5*  MCV 86.8  --  90.2  --  92.5 90.8 92.0  PLT 137*  --  189  --  201 212 220  < > = values in this interval not displayed.    Recent Labs Lab 03/07/16 1841 03/08/16 0019 03/08/16 0930  TROPONINI 0.44* 0.49* 0.49*    Recent Labs Lab 03/12/16 1700 03/12/16 2201 03/13/16 1154 03/13/16 1649 03/13/16 1949  GLUCAP 151* 136* 180* 195* 134*   Medications:   . amLODipine  5 mg Oral QHS  . atorvastatin  80 mg Oral q1800  . brinzolamide  1 drop Both Eyes BID   And  . brimonidine  1 drop Both Eyes BID  . calcitRIOL  1.5 mcg Oral Q M,W,F-HD  .  carvedilol  3.125 mg Oral BID WC  . [START ON 03/15/2016] darbepoetin (ARANESP) injection - DIALYSIS  100 mcg Intravenous Q Fri-HD  . feeding supplement  1 Container Oral TID WC & HS  . feeding supplement (PRO-STAT SUGAR FREE 64)  30 mL Oral BID  . insulin aspart  0-5 Units Subcutaneous QHS  . insulin aspart  0-9 Units Subcutaneous TID WC  . isosorbide mononitrate  15 mg Oral QHS  . latanoprost  1 drop Both Eyes QHS  . multivitamin  1 tablet Oral Daily  . pantoprazole  40 mg Oral QAC breakfast  . polyethylene glycol  17 g Oral BID  . sevelamer carbonate  1,600 mg Oral TID WC  . sodium chloride flush  3 mL Intravenous Q12H   OP Dialysis: [facility] MWF 3.5h 56.5kg 2/2.25 bath P4 Hep none LFA AVF Mircera 150 mcg IV q 2 weeks (last dose 02/21/16 100 mcg IV HGB 10.2 03/09/16) Calcitriol 1.75 mcg IV q MWF (Last PTH 651 02/07/16)  Problem/Plan: 1. Lower GI  bleed - Hb 9.4->8.3 last 24 hours. s/p 9 units prbc's this admission (between 6/12 and 6/17) Capsule endo showed R colon bleed. Last bloody stool 6/18. Last transfused 6/17. Diet advancing. Trying to avoid surgery if at all possible. This is felt to be 2nd diverticular bleed. Plans to DC today.  2. HD: MWF at [facility]. Will have HD tomorrow in-center. K+4.1 06/21.  3. Anemia ABL/ CKD- Darbe 100/ wk here (last 6/15) HGB 8.5 today. Adjust OP ESA dose. Will need Hb tomorrow at HD with adjustment of Mircera dose. Note 9 units of blood, last transfusion 6/17. No Fe studies done during the admission. 4. HTN - BP's soft side. BP meds have been reduced this admission. Now just amlodipine and carvedilol both low dose. 5. Vol - Unclear if weights accurate (bed weights - so prob not). EDW 56.5. HD 03/14/16 pre wt 59.9 Net UF 3001 Post wt 57.1 kg. Patient had been leaving in this range last two in-center tx. Reset EDW to 57 kgs on DC.  6. MBD Calcitriol/sevelamer. Phos 6.3 Renvela recently ^. Ca 8.9 C Ca 10.4. Decrease calcitriol to 1.5 mcg  q tx upon DC.  7. PAD bilat amputee (L AK, R BK) 8. Hx CVA 9. CM EF 30%/ aortic stenosis 10. Hx known CAD w chron stable angina on med Rx  Rita H. Brown NP-C 03/14/2016, 8:21 AM  [employer] [phone]   I have seen and examined this patient and agree with plan and assessment in the above note. Dialysis prescription and med changes as highlighted.  Miria Cappelli B,MD 03/14/2016 10:52 AM

## 2016-03-14 NOTE — Care Management Note (Signed)
Case Management Note  Patient Details  Name: Randy [name] MRN: [id number] Date of Birth: 1932/02/05  Subjective/Objective:        CM following for progression and d/c planning.            Action/Plan: 03/14/2016 Met with pt and family member, no d/c needs identified, pt setting up in wheelchair, assisting in dressing himself, excited to be going home.   Expected Discharge Date:  03/14/2016               Expected Discharge Plan:  Home/Self Care  In-House Referral:  NA  Discharge planning Services  NA  Post Acute Care Choice:  NA Choice offered to:  NA  DME Arranged:   NA DME Agency:   NA  HH Arranged:   NA HH Agency:   NA  Status of Service:  Completed, signed off  If discussed at [place] of Stay Meetings, dates discussed:    Additional Comments:  Adron Bene, RN 03/14/2016, 11:40 AM

## 2016-03-14 NOTE — Evaluation (Signed)
Physical Therapy Evaluation & discharge Patient Details Name: Randy [name] MRN: [id number] DOB: Aug 04, 1932 Today's Date: 03/14/2016   History of Present Illness  82 y/o admitted with rectal bleeding on 03/03/16. PMH includes R BKA, L AKA, gout, PVD, stroke, lumbar disc surgery  Clinical Impression  Pt transferring with sliding board with S.  Wife provides S with sliding board transfers at home per patient report.  No follow up PT or DME needs. Pt scheduled to dc today. Will sign off.    Follow Up Recommendations No PT follow up    Equipment Recommendations  None recommended by PT    Recommendations for Other Services       Precautions / Restrictions        Mobility  Bed Mobility Overal bed mobility: Modified Independent                Transfers Overall transfer level: Needs assistance Equipment used:  (sliding board) Transfers: Lateral/Scoot Transfers          Lateral/Scoot Transfers: Supervision;With slide board;From elevated surface General transfer comment: S only due to different environment than at home, but no physical A  Ambulation/Gait             General Gait Details: n/a  Stairs            Wheelchair Mobility    Modified Rankin (Stroke Patients Only)       Balance Overall balance assessment: Needs assistance   Sitting balance-Leahy Scale: Good Sitting balance - Comments: Pt sitting EOB eating breakfast       Standing balance comment: n/a                             Pertinent Vitals/Pain Pain Assessment: No/denies pain    Home Living Family/patient expects to be discharged to:: Private residence Living Arrangements: Spouse/significant other Available Help at Discharge: Family Type of Home: House Home Access: Stairs to enter     Home Layout: One level Home Equipment: Wheelchair - Press photographer;Bedside commode      Prior Function Level of Independence: Needs assistance   Gait / Transfers  Assistance Needed: Transfers via sliding board with S from wife           Hand Dominance        Extremity/Trunk Assessment   Upper Extremity Assessment: Overall WFL for tasks assessed           Lower Extremity Assessment: RLE deficits/detail;LLE deficits/detail RLE Deficits / Details: BKA LLE Deficits / Details: AKA     Communication      Cognition Arousal/Alertness: Awake/alert Behavior During Therapy: WFL for tasks assessed/performed Overall Cognitive Status: Within Functional Limits for tasks assessed                      General Comments      Exercises        Assessment/Plan    PT Assessment Patent does not need any further PT services  PT Diagnosis Generalized weakness   PT Problem List    PT Treatment Interventions     PT Goals (Current goals can be found in the Care Plan section) Acute Rehab PT Goals Patient Stated Goal: get his eggs that he ordered and go home today PT Goal Formulation: All assessment and education complete, DC therapy    Frequency     Barriers to discharge        Co-evaluation  End of Session   Activity Tolerance: Patient tolerated treatment well Patient left: in chair;with call bell/phone within reach;with chair alarm set           Time: 0905 (no charge for time when he ate eggs he had been waiting for)-0928 PT Time Calculation (min) (ACUTE ONLY): 23 min   Charges:   PT Evaluation $PT Eval Low Complexity: 1 Procedure     PT G Codes:        Laytoya Ion LUBECK 03/14/2016, 9:33 AM

## 2016-03-14 NOTE — Care Management Important Message (Signed)
Important Message  Patient Details  Name: Randy [name] MRN: [id number] Date of Birth: May 09, 1932   Medicare Important Message Given:  Yes    Amadi Yoshino, Rory [name], RN 03/14/2016, 11:39 AM

## 2016-03-21 ENCOUNTER — Encounter: Payer: Self-pay | Admitting: Adult Health

## 2016-03-21 ENCOUNTER — Ambulatory Visit (INDEPENDENT_AMBULATORY_CARE_PROVIDER_SITE_OTHER): Payer: Medicare Other | Admitting: Adult Health

## 2016-03-21 VITALS — BP 124/58 | HR 77 | Ht 69.0 in

## 2016-03-21 DIAGNOSIS — I10 Essential (primary) hypertension: Secondary | ICD-10-CM | POA: Diagnosis not present

## 2016-03-21 DIAGNOSIS — I5189 Other ill-defined heart diseases: Secondary | ICD-10-CM

## 2016-03-21 DIAGNOSIS — I519 Heart disease, unspecified: Secondary | ICD-10-CM | POA: Diagnosis not present

## 2016-03-21 DIAGNOSIS — I2510 Atherosclerotic heart disease of native coronary artery without angina pectoris: Secondary | ICD-10-CM

## 2016-03-21 NOTE — Patient Instructions (Signed)
Medication Instructions:  Your physician recommends that you continue on your current medications as directed. Please refer to the Current Medication list given to you today.  Labwork: NONE  Testing/Procedures: NONE  Follow-Up: Your physician recommends that you schedule a follow-up appointment in: 3 MONTHS WITH DR. BRANCH  Any Other Special Instructions Will Be Listed Below (If Applicable).  If you need a refill on your cardiac medications before your next appointment, please call your pharmacy. 

## 2016-03-21 NOTE — Progress Notes (Signed)
Cardiology Office Note   Date:  03/21/2016   ID:  Randy [name], DOB 1932-06-14, MRN [id number]  PCP:  [name], MD  Cardiologist: Cloria Spring, NP   Chief Complaint  Patient presents with  . Atrial Fibrillation      History of Present Illness: Randy [name] is a 82 y.o. male who presents for ongoing assessment and management of chronic combined CHF, most recent echo 07/2015 with EF of 30-35%, Hypertension, CAD, with most recent cardiac cath 07/2014 demonstrated diffuse mainly moderate disease, with 80% ramus managed medically, atrial fib, and mild to moderate AoV stenosis. He was last seen by Dr. Harl Bowie on 12/14/2015. He was found to be stable and continued on medical regimen.   The patient was admitted to the hospital in the setting of her right colonic diverticular bleed, receiving 9 units of packed red blood cells, positive capsule endoscopy dated right colonic bleed. Source of bleeding was diverticulosis. He was to avoid aspirin for several weeks and followup with GI. He was continued on carvedilol, Norvasc, and isosorbide. Fluid management is through hemodialysis and nephrology.  Past Medical History  Diagnosis Date  . Hypercholesterolemia   . Gout   . Nonischemic cardiomyopathy (HCC)     LVEF 35-40%  . Anemia of chronic disease   . Essential hypertension   . Arthritis   . Peripheral vascular disease ([place])     Status post right below knee amputation for a nonhealing wound of the right foot 12/2014  . History of pneumonia 2014  . CAD (coronary artery disease)     Moderate multivessel disease 07/2015 - managed medically  . History of myopericarditis 2015    07/2015  . History of stroke   . ESRD on hemodialysis [facility])     M/W/F in [place] - Dr. Florene Glen  . History of blood transfusion   . Pneumonia     Past Surgical History  Procedure Laterality Date  . Knee arthroscopy Right 2007  . Back surgery    . Hemorrhoid surgery  1970's  . Ganglion cyst  excision  01/03/2012    Procedure: REMOVAL GANGLION OF WRIST;  Surgeon: Scherry Ran, MD;  Location: [facility];  Service: General;  Laterality: Right;  . Colonoscopy      remote past by Dr. Tamala Julian in [place], [place]   . Av fistula placement  08/24/2012    Procedure: ARTERIOVENOUS (AV) FISTULA CREATION;  Surgeon: Rosetta Posner, MD;  Location: [place];  Service: Vascular;  Laterality: Left;  . Insertion of dialysis catheter Right      neck  . Insertion of dialysis catheter  10/19/2012    Procedure: INSERTION OF DIALYSIS CATHETER;  Surgeon: Rosetta Posner, MD;  Location: [place];  Service: Vascular;  Laterality: N/A;  REMOVE TEMPORARY CATH  . Cholecystectomy    . Lumbar disc surgery  2004  . Left heart catheterization with coronary angiogram N/A 08/17/2014    Procedure: LEFT HEART CATHETERIZATION WITH CORONARY ANGIOGRAM;  Surgeon: Larey Dresser, MD;  Location: [facility] CATH LAB;  Service: Cardiovascular;  Laterality: N/A;  . Colonoscopy Left 09/26/2014    Dr. Paulita Fujita: old and semi-fresh blood scattered throughout the colon, several medium-sized diverticula, no obvious focal bleeding site, no blood in TI, several polyps seen in cecum, ascending colon and proximal transverse s/p biopsy (tubular adenomas)  . Amputation Right 01/05/2015    BKA  . Capd removal N/A 03/13/2015    Procedure: CONTINUOUS AMBULATORY PERITONEAL DIALYSIS  (CAPD) CATHETER  REMOVAL;  Surgeon: Coralie Keens, MD;  Location: [place];  Service: General;  Laterality: N/A;  . Cardiac catheterization    . Above knee leg amputation Left 10/10/2015  . Amputation Left 10/10/2015    Procedure: AMPUTATION ABOVE KNEE LEFT;  Surgeon: Angelia Mould, MD;  Location: [place];  Service: Vascular;  Laterality: Left;  . Colonoscopy N/A 03/05/2016    Procedure: COLONOSCOPY;  Surgeon: Danie Binder, MD;  [facility];  Service: Endoscopy;  Laterality: N/A;  . Esophagogastroduodenoscopy N/A 03/05/2016    Procedure: ESOPHAGOGASTRODUODENOSCOPY (EGD);   Surgeon: Danie Binder, MD;  [facility];  Service: Endoscopy;  Laterality: N/A;  . Givens capsule study N/A 03/07/2016    Procedure: GIVENS CAPSULE STUDY;  Surgeon: Clarene Essex, MD;  Location: [place];  Service: Endoscopy;  Laterality: N/A;     Current Outpatient Prescriptions  Medication Sig Dispense Refill  . Amino Acids-Protein Hydrolys (FEEDING SUPPLEMENT, PRO-STAT SUGAR FREE 64,) LIQD Take 30 mLs by mouth 2 (two) times daily. 900 mL 0  . amLODipine (NORVASC) 10 MG tablet Take 0.5 tablets (5 mg total) by mouth daily.    Marland Kitchen atorvastatin (LIPITOR) 80 MG tablet Take 1 tablet (80 mg total) by mouth daily. 90 tablet 3  . calcitRIOL (ROCALTROL) 0.5 MCG capsule Take 1 capsule (0.5 mcg total) by mouth daily. 30 capsule 3  . carvedilol (COREG) 3.125 MG tablet Take 1 tablet (3.125 mg total) by mouth 2 (two) times daily with a meal. 60 tablet 0  . docusate sodium (COLACE) 100 MG capsule Take 2 capsules (200 mg total) by mouth daily. (Patient taking differently: Take 200 mg by mouth daily as needed for mild constipation or moderate constipation. ) 10 capsule 0  . feeding supplement (BOOST / RESOURCE BREEZE) LIQD Take 1 Container by mouth 4 (four) times daily -  with meals and at bedtime. 14 Container 0  . gabapentin (NEURONTIN) 100 MG capsule Take 1 capsule (100 mg total) by mouth at bedtime.    . isosorbide mononitrate (IMDUR) 30 MG 24 hr tablet Take 0.5 tablets (15 mg total) by mouth at bedtime. 45 tablet 3  . lidocaine-prilocaine (EMLA) cream Apply 1 application topically every Monday, Wednesday, and Friday. For dialysis  10  . LUMIGAN 0.01 % SOLN Place 1 drop into both eyes at bedtime.  11  . multivitamin (RENA-VIT) TABS tablet Take 1 tablet by mouth every Monday, Wednesday, and Friday. Takes on dialysis days    . nitroGLYCERIN (NITROSTAT) 0.4 MG SL tablet Place 1 tablet (0.4 mg total) under the tongue every 5 (five) minutes as needed for chest pain. 30 tablet 12  . oxyCODONE (OXY  IR/ROXICODONE) 5 MG immediate release tablet Take 1 tablet (5 mg total) by mouth every 6 (six) hours as needed for moderate pain or severe pain. 50 tablet 0  . pantoprazole (PROTONIX) 40 MG tablet Take 1 tablet (40 mg total) by mouth daily at 12 noon.    . polyethylene glycol (MIRALAX / GLYCOLAX) packet Take 17 g by mouth 2 (two) times daily. 14 each 0  . polyvinyl alcohol (LIQUIFILM TEARS) 1.4 % ophthalmic solution Place 1 drop into both eyes as needed for dry eyes. 15 mL 0  . sevelamer carbonate (RENVELA) 800 MG tablet Take 1,600-2,400 mg by mouth See admin instructions. Take 3 tablets (2400 mg) with meals and 2 tablets (1600 mg) with snacks    . SIMBRINZA 1-0.2 % SUSP Place 1 drop into both eyes 2 (two) times  daily.  11   No current facility-administered medications for this visit.    Allergies:   Review of patient's allergies indicates no known allergies.    Social History:  The patient  reports that he quit smoking about 17 years ago. His smoking use included Cigarettes. He has a 30 pack-year smoking history. He quit smokeless tobacco use about 22 years ago. His smokeless tobacco use included Chew. He reports that he does not drink alcohol or use illicit drugs.   Family History:  [name] family history includes Cancer in his sister; Colon cancer in his brother and brother; Stroke in his mother. There is no history of Anesthesia problems, Hypotension, Malignant hyperthermia, or Pseudochol deficiency.    ROS: All other systems are reviewed and negative. Unless otherwise mentioned in H&P    PHYSICAL EXAM: VS:  BP 124/58 mmHg  Pulse 77  Ht 5\' 9"  (1.753 m)  Wt   SpO2 97% , BMI There is no weight on file to calculate BMI. GEN: Well nourished, well developed, in no acute distress HEENT: normal Neck: no JVD, carotid bruits, or masses Cardiac: IRRR; 2/6 systolic murmur rubs, or gallops,no edema  Respiratory:  clear to auscultation bilaterally, normal work of breathing GI: soft,  nontender, nondistended, + BS MS: no deformity or atrophyRight BKA., Left AKA Dialysis shunt to the left arm.  Skin: warm and dry, no rash Neuro:  Strength and sensation are intact Psych: euthymic mood, full affect   Recent Labs: 10/21/2015: B Natriuretic Peptide 3090.9*; TSH 1.063 03/04/2016: ALT 8* 03/11/2016: Magnesium 1.8 03/14/2016: BUN 40*; Creatinine, Ser 5.11*; Hemoglobin 8.5*; Platelets 220; Potassium 4.1; Sodium 133*    Lipid Panel    Component Value Date/Time   CHOL 60 03/07/2016 0159   TRIG 73 03/07/2016 0159   HDL 23* 03/07/2016 0159   CHOLHDL 2.6 03/07/2016 0159   VLDL 15 03/07/2016 0159   LDLCALC 22 03/07/2016 0159      Wt Readings from Last 3 Encounters:  03/13/16 125 lb 14.1 oz (57.1 kg)  12/20/15 123 lb 14.4 oz (56.2 kg)  11/15/15 128 lb (58.06 kg)     ASSESSMENT AND PLAN:  1. CAD: he is doing well today.most recent cardiac catheterization was in the LAD in 2015, this revealed moderate multivessel CAD medical management recommended. Would not recommend reinstitution of aspirin at this time in the setting of GI bleed with diverticulosis requiring blood transfusion.he'll continue on carvedilol 3.125 mg, statin therapy. He denies any complaints of chest pain or dyspnea. He is wheelchair bound and is not as active. The patient will be seen again in 3 months by Dr. Harl Bowie at their request.  2. Hypertension:blood pressure is mildly elevated today. Fluid volume status is being controlled by nephrology. He will continue on isosorbide mononitrate. He is not on ACE inhibitor at this time. He will remain on Norvasc 10 mg daily.  3. Nonischemic cardiomyopathy: most recent echocardiogram dated 03/08/2016 revealed an EF of 45-50%, with moderate aortic valve stenosis.the patient is fluid management is per dialysis. There is no evidence of fluid overload at this time.  Current medicines are reviewed at length with the patient today.    Labs/ tests ordered today include:  No  orders of the defined types were placed in this encounter.     Disposition:   FU with Dr.Branch 3 months.   Signed, Jory Sims, NP  03/21/2016 3:38 PM    [facility] [address] [place], [place] [address] Phone: [phone]; Fax: [phone]

## 2016-03-21 NOTE — Progress Notes (Signed)
Name: Randy [name]    DOB: 11/15/1931  Age: 82 y.o.  MR#: [id number]       PCP:  [name], MD      Insurance: Payor: BLUE CROSS BLUE [employer]IELD MEDICARE / Plan: BCBS MEDICARE / Product Type: *No Product type* /   CC:   No chief complaint on file.   VS There were no vitals filed for this visit.  Weights Current Weight  03/13/16 125 lb 14.1 oz (57.1 kg)  12/20/15 123 lb 14.4 oz (56.2 kg)  11/15/15 128 lb (58.06 kg)    Blood Pressure  BP Readings from Last 3 Encounters:  03/14/16 143/63  12/20/15 117/65  12/14/15 110/60     Admit date:  (Not on file) Last encounter with RMR:  Visit date not found   Allergy Review of patient's allergies indicates no known allergies.  Current Outpatient Prescriptions  Medication Sig Dispense Refill  . Amino Acids-Protein Hydrolys (FEEDING SUPPLEMENT, PRO-STAT SUGAR FREE 64,) LIQD Take 30 mLs by mouth 2 (two) times daily. 900 mL 0  . amLODipine (NORVASC) 10 MG tablet Take 0.5 tablets (5 mg total) by mouth daily.    Marland Kitchen atorvastatin (LIPITOR) 80 MG tablet Take 1 tablet (80 mg total) by mouth daily. 90 tablet 3  . calcitRIOL (ROCALTROL) 0.5 MCG capsule Take 1 capsule (0.5 mcg total) by mouth daily. 30 capsule 3  . carvedilol (COREG) 3.125 MG tablet Take 1 tablet (3.125 mg total) by mouth 2 (two) times daily with a meal. 60 tablet 0  . docusate sodium (COLACE) 100 MG capsule Take 2 capsules (200 mg total) by mouth daily. (Patient taking differently: Take 200 mg by mouth daily as needed for mild constipation or moderate constipation. ) 10 capsule 0  . feeding supplement (BOOST / RESOURCE BREEZE) LIQD Take 1 Container by mouth 4 (four) times daily -  with meals and at bedtime. 14 Container 0  . gabapentin (NEURONTIN) 100 MG capsule Take 1 capsule (100 mg total) by mouth at bedtime.    . isosorbide mononitrate (IMDUR) 30 MG 24 hr tablet Take 0.5 tablets (15 mg total) by mouth at bedtime. 45 tablet 3  . lidocaine-prilocaine (EMLA) cream Apply 1  application topically every Monday, Wednesday, and Friday. For dialysis  10  . LUMIGAN 0.01 % SOLN Place 1 drop into both eyes at bedtime.  11  . multivitamin (RENA-VIT) TABS tablet Take 1 tablet by mouth every Monday, Wednesday, and Friday. Takes on dialysis days    . nitroGLYCERIN (NITROSTAT) 0.4 MG SL tablet Place 1 tablet (0.4 mg total) under the tongue every 5 (five) minutes as needed for chest pain. 30 tablet 12  . oxyCODONE (OXY IR/ROXICODONE) 5 MG immediate release tablet Take 1 tablet (5 mg total) by mouth every 6 (six) hours as needed for moderate pain or severe pain. 50 tablet 0  . pantoprazole (PROTONIX) 40 MG tablet Take 1 tablet (40 mg total) by mouth daily at 12 noon.    . polyethylene glycol (MIRALAX / GLYCOLAX) packet Take 17 g by mouth 2 (two) times daily. 14 each 0  . polyvinyl alcohol (LIQUIFILM TEARS) 1.4 % ophthalmic solution Place 1 drop into both eyes as needed for dry eyes. 15 mL 0  . sevelamer carbonate (RENVELA) 800 MG tablet Take 1,600-2,400 mg by mouth See admin instructions. Take 3 tablets (2400 mg) with meals and 2 tablets (1600 mg) with snacks    . SIMBRINZA 1-0.2 % SUSP Place 1 drop into both eyes 2 (two)  times daily.  11   No current facility-administered medications for this visit.    Discontinued Meds:   There are no discontinued medications.  Patient Active Problem List   Diagnosis Date Noted  . Hypertriglyceridemia   . Hypokalemia   . Hypomagnesemia   . Gastrointestinal hemorrhage associated with anorectal source   . CAD in native artery   . Aortic stenosis   . Coronary artery disease involving native coronary artery of native heart with angina pectoris ([place])   . Hyperlipidemia   . Cardiomyopathy, ischemic   . GI bleed 03/03/2016  . Hyperglycemia 03/03/2016  . GI bleeding 03/03/2016  . Elevated troponin   . Hypertensive urgency 11/09/2015  . Hypertensive urgency, malignant 11/08/2015  . Malnutrition of moderate degree 11/08/2015  . Adjustment  disorder with depressed mood   . Constipation due to pain medication   . Slow transit constipation   . Unilateral complete BKA ([place]) 10/23/2015  . Labile blood pressure   . Muscle stiffness   . S/P AKA (above knee amputation) unilateral ([place])   . History of right above knee amputation ([place])   . Benign essential HTN   . Unilateral AKA ([place]) 10/12/2015  . Abnormality of gait   . Status post above knee amputation of left lower extremity ([place])   . Chronic combined systolic and diastolic congestive heart failure ([place])   . ESRD on dialysis ([place])   . Essential hypertension   . Post-operative pain   . Peripheral vascular disease with pain at rest [facility]) 10/10/2015  . Chest pain 08/04/2015  . PD catheter dysfunction ([place]) 03/13/2015  . Cellulitis and abscess of foot   . Kidney mass   . Other emphysema ([place])   . Acute blood loss anemia   . Physical deconditioning   . S/P BKA (below knee amputation) unilateral ([place])   . Renal mass 01/01/2015  . Foot pain 12/30/2014  . Foot pain, right 12/30/2014  . CHF (congestive heart failure) ([place]) 12/30/2014  . CAD (coronary artery disease) 12/30/2014  . Hyperkalemia 12/30/2014  . ESRD (end stage renal disease) on dialysis ([place])   . Blood loss anemia   . Lower GI bleed 09/21/2014  . Fluid overload 08/29/2014  . Acute on chronic combined systolic and diastolic CHF (congestive heart failure) ([place]) 08/29/2014  . Acute respiratory failure with hypoxia ([place]) 08/29/2014  . Acute respiratory failure with hypoxemia ([place]) 08/29/2014  . Acute myopericarditis 08/18/2014  . Anemia of chronic disease   . End stage renal disease, has been peritoneal, now hemodialysis 07/28/2012  . Secondary cardiomyopathy ([place]) 06/26/2012  . HLD (hyperlipidemia) 12/10/2006  . Hypertensive heart disease 08/18/2006  . OSTEOARTHRITIS 08/18/2006  . Polycystic kidney 08/18/2006    LABS    Component Value Date/Time   NA 133* 03/14/2016 0524   NA 132* 03/13/2016 0650   NA  133* 03/12/2016 0508   NA 136 02/22/2013 1643   K 4.1 03/14/2016 0524   K 5.3* 03/13/2016 0650   K 4.6 03/12/2016 0508   K 3.6 02/22/2013 1643   CL 95* 03/14/2016 0524   CL 95* 03/13/2016 0650   CL 96* 03/12/2016 0508   CL 103 02/22/2013 1643   CO2 27 03/14/2016 0524   CO2 24 03/13/2016 0650   CO2 27 03/12/2016 0508   CO2 24 02/22/2013 1643   GLUCOSE 151* 03/14/2016 0524   GLUCOSE 66 03/13/2016 0650   GLUCOSE 82 03/12/2016 0508   GLUCOSE 87 02/22/2013 1643   BUN 40* 03/14/2016 0524  BUN 60* 03/13/2016 0650   BUN 36* 03/12/2016 0508   BUN 48* 02/22/2013 1643   CREATININE 5.11* 03/14/2016 0524   CREATININE 7.51* 03/13/2016 0650   CREATININE 5.20* 03/12/2016 0508   CREATININE 6.02* 02/22/2013 1643   CALCIUM 8.9 03/14/2016 0524   CALCIUM 8.5* 03/13/2016 0650   CALCIUM 8.9 03/12/2016 0508   CALCIUM 8.9 02/22/2013 1643   CALCIUM 8.1* 06/24/2012 1634   CALCIUM 8.8 06/23/2012 1111   GFRNONAA 9* 03/14/2016 0524   GFRNONAA 6* 03/13/2016 0650   GFRNONAA 9* 03/12/2016 0508   GFRNONAA 8* 02/22/2013 1643   GFRAA 11* 03/14/2016 0524   GFRAA 7* 03/13/2016 0650   GFRAA 11* 03/12/2016 0508   GFRAA 9* 02/22/2013 1643   CMP     Component Value Date/Time   NA 133* 03/14/2016 0524   NA 136 02/22/2013 1643   K 4.1 03/14/2016 0524   K 3.6 02/22/2013 1643   CL 95* 03/14/2016 0524   CL 103 02/22/2013 1643   CO2 27 03/14/2016 0524   CO2 24 02/22/2013 1643   GLUCOSE 151* 03/14/2016 0524   GLUCOSE 87 02/22/2013 1643   BUN 40* 03/14/2016 0524   BUN 48* 02/22/2013 1643   CREATININE 5.11* 03/14/2016 0524   CREATININE 6.02* 02/22/2013 1643   CALCIUM 8.9 03/14/2016 0524   CALCIUM 8.9 02/22/2013 1643   CALCIUM 8.1* 06/24/2012 1634   PROT 5.1* 03/04/2016 0441   ALBUMIN 2.1* 03/14/2016 0524   AST 12* 03/04/2016 0441   ALT 8* 03/04/2016 0441   ALKPHOS 53 03/04/2016 0441   BILITOT 0.5 03/04/2016 0441   GFRNONAA 9* 03/14/2016 0524   GFRNONAA 8* 02/22/2013 1643   GFRAA 11* 03/14/2016  0524   GFRAA 9* 02/22/2013 1643       Component Value Date/Time   WBC 5.7 03/14/2016 0524   WBC 6.0 03/13/2016 0658   WBC 6.3 03/12/2016 0508   WBC 5.3 02/22/2013 1643   HGB 8.5* 03/14/2016 0524   HGB 8.3* 03/13/2016 0658   HGB 9.4* 03/12/2016 0508   HGB 11.3* 02/22/2013 1643   HCT 27.5* 03/14/2016 0524   HCT 26.6* 03/13/2016 0658   HCT 29.8* 03/12/2016 0508   HCT 33.6* 02/22/2013 1643   MCV 92.0 03/14/2016 0524   MCV 90.8 03/13/2016 0658   MCV 92.5 03/12/2016 0508   MCV 92 02/22/2013 1643    Lipid Panel     Component Value Date/Time   CHOL 60 03/07/2016 0159   TRIG 73 03/07/2016 0159   HDL 23* 03/07/2016 0159   CHOLHDL 2.6 03/07/2016 0159   VLDL 15 03/07/2016 0159   LDLCALC 22 03/07/2016 0159    ABG    Component Value Date/Time   PHART 7.371 01/06/2015 1138   PCO2ART 46.1* 01/06/2015 1138   PO2ART 410.0* 01/06/2015 1138   HCO3 26.0* 01/06/2015 1138   TCO2 28 03/03/2016 2030   ACIDBASEDEF 3.7* 08/27/2012 0430   O2SAT 99.7 01/06/2015 1138     Lab Results  Component Value Date   TSH 1.063 10/21/2015   BNP (last 3 results)  Recent Labs  08/04/15 0625 10/21/15 0936  BNP 2452.0* 3090.9*    ProBNP (last 3 results) No results for input(s): PROBNP in the last 8760 hours.  Cardiac Panel (last 3 results) No results for input(s): CKTOTAL, CKMB, TROPONINI, RELINDX in the last 72 hours.  Iron/TIBC/Ferritin/ %Sat    Component Value Date/Time   IRON 24* 10/25/2015 1628   TIBC 113* 10/25/2015 1628   FERRITIN 1242* 10/25/2015 1628  IRONPCTSAT 21 10/25/2015 1628     EKG Orders placed or performed during the hospital encounter of 03/03/16  . EKG 12-Lead  . EKG 12-Lead  . EKG 12-Lead  . EKG 12-Lead  . EKG 12-Lead  . EKG 12-Lead  . EKG 12-Lead  . EKG 12-Lead     Prior Assessment and Plan Problem List as of 03/21/2016      Cardiovascular and Mediastinum   Hypertensive heart disease   Last Assessment & Plan 07/17/2012 Office Visit Written 07/17/2012   9:48 AM by Satira Sark, MD    Continue current regimen with increase in labetalol to 400 mg twice daily.      Secondary cardiomyopathy [facility])   Last Assessment & Plan 07/17/2012 Office Visit Written 07/17/2012  9:48 AM by Satira Sark, MD    Symptomatically stable, much improved from hospitalization. LVEF 40-45% by most recent assessment. Will focus on medical therapy and observation. No indication for device at this time.      Acute myopericarditis   Acute on chronic combined systolic and diastolic CHF (congestive heart failure) (HCC)   CHF (congestive heart failure) (HCC)   CAD (coronary artery disease)   Chronic combined systolic and diastolic congestive heart failure (HCC)   Essential hypertension   Benign essential HTN   Hypertensive urgency, malignant   Hypertensive urgency   Coronary artery disease involving native coronary artery of native heart with angina pectoris (HCC)   Cardiomyopathy, ischemic   Aortic stenosis   CAD in native artery     Respiratory   Acute respiratory failure with hypoxia (HCC)   Acute respiratory failure with hypoxemia (HCC)   Other emphysema (HCC)     Digestive   Lower GI bleed   Slow transit constipation   Constipation due to pain medication   GI bleed   GI bleeding   Gastrointestinal hemorrhage associated with anorectal source     Musculoskeletal and Integument   OSTEOARTHRITIS     Genitourinary   Polycystic kidney   End stage renal disease, has been peritoneal, now hemodialysis   ESRD (end stage renal disease) on dialysis ([place])   ESRD on dialysis ([place])     Other   HLD (hyperlipidemia)   Anemia of chronic disease   Fluid overload   Blood loss anemia   Foot pain   Foot pain, right   Hyperkalemia   Renal mass   Cellulitis and abscess of foot   Kidney mass   Acute blood loss anemia   Physical deconditioning   S/P BKA (below knee amputation) unilateral (HCC)   PD catheter dysfunction (HCC)   Chest pain   Peripheral  vascular disease with pain at rest [facility])   Abnormality of gait   Status post above knee amputation of left lower extremity (HCC)   Post-operative pain   Unilateral AKA (HCC)   S/P AKA (above knee amputation) unilateral (HCC)   History of right above knee amputation (HCC)   Labile blood pressure   Muscle stiffness   Unilateral complete BKA (HCC)   Adjustment disorder with depressed mood   Malnutrition of moderate degree   Elevated troponin   Hyperglycemia   Hyperlipidemia   Hypertriglyceridemia   Hypokalemia   Hypomagnesemia       Imaging: Nm Gi Blood Loss  03/06/2016  CLINICAL DATA:  Bleeding per rectum x4 days. EXAM: NUCLEAR MEDICINE GASTROINTESTINAL BLEEDING SCAN TECHNIQUE: Sequential abdominal images were obtained following intravenous administration of Tc-88m labeled red blood cells. RADIOPHARMACEUTICALS:  25.0 mCi  Tc-59m in-vitro labeled red cells. COMPARISON:  Abdominal series 215 2017. CT 12/31/2014. Nuclear medicine scan 09/28/2014 and 09/24/2014. FINDINGS: Standard GI bleeding series obtained. Activity noted in the upper abdomen bilaterally and in the lower pelvis bilaterally most likely secondary to genitourinary normal activity. Perineal activity again noted. No clearcut GI bleed noted. IMPRESSION: Negative exam. Electronically Signed   ByMarcello Moores  Register   On: 03/06/2016 14:22

## 2016-04-18 ENCOUNTER — Ambulatory Visit: Payer: Self-pay | Admitting: Cardiology

## 2016-05-04 ENCOUNTER — Encounter (HOSPITAL_COMMUNITY): Payer: Self-pay | Admitting: Emergency Medicine

## 2016-05-04 ENCOUNTER — Emergency Department (HOSPITAL_COMMUNITY)
Admission: EM | Admit: 2016-05-04 | Discharge: 2016-05-04 | Disposition: A | Discharge status: Home / Self Care | Payer: Medicare Other | Attending: Emergency Medicine | Admitting: Emergency Medicine

## 2016-05-04 DIAGNOSIS — Z87891 Personal history of nicotine dependence: Secondary | ICD-10-CM | POA: Diagnosis not present

## 2016-05-04 DIAGNOSIS — N186 End stage renal disease: Secondary | ICD-10-CM | POA: Diagnosis not present

## 2016-05-04 DIAGNOSIS — I5042 Chronic combined systolic (congestive) and diastolic (congestive) heart failure: Secondary | ICD-10-CM | POA: Insufficient documentation

## 2016-05-04 DIAGNOSIS — Z8673 Personal history of transient ischemic attack (TIA), and cerebral infarction without residual deficits: Secondary | ICD-10-CM | POA: Diagnosis not present

## 2016-05-04 DIAGNOSIS — M25532 Pain in left wrist: Secondary | ICD-10-CM | POA: Diagnosis present

## 2016-05-04 DIAGNOSIS — M109 Gout, unspecified: Secondary | ICD-10-CM | POA: Diagnosis not present

## 2016-05-04 DIAGNOSIS — I25119 Atherosclerotic heart disease of native coronary artery with unspecified angina pectoris: Secondary | ICD-10-CM | POA: Insufficient documentation

## 2016-05-04 DIAGNOSIS — I132 Hypertensive heart and chronic kidney disease with heart failure and with stage 5 chronic kidney disease, or end stage renal disease: Secondary | ICD-10-CM | POA: Insufficient documentation

## 2016-05-04 DIAGNOSIS — Z79899 Other long term (current) drug therapy: Secondary | ICD-10-CM | POA: Insufficient documentation

## 2016-05-04 MED ORDER — HYDROCODONE-ACETAMINOPHEN 5-325 MG PO TABS: 1.0000 | ORAL_TABLET | Freq: Two times a day (BID) | ORAL | 0 refills | Status: DC

## 2016-05-04 MED ORDER — HYDROCODONE-ACETAMINOPHEN 5-325 MG PO TABS
1.0000 | ORAL_TABLET | Freq: Once | ORAL | Status: AC
  Administered 2016-05-04: 1 via ORAL
  Filled 2016-05-04: qty 1

## 2016-05-04 NOTE — ED Triage Notes (Signed)
Pt presents with swelling and pain to L. Wrist and hand since yesterday. Family concerned because pt had a fistulagram 2 weeks ago and they are concerned for blood clot.

## 2016-05-04 NOTE — ED Provider Notes (Signed)
[facility] DEPT Provider Note   CSN: [id number] Arrival date & time: 05/04/16  [id number]  First Provider Contact:  First MD Initiated Contact with Patient 05/04/16 2035        History   Chief Complaint Chief Complaint  Patient presents with  . Wrist Pain    HPI Randy [name] is a 82 y.o. male.  Patient with a 5 day history of swelling to the back of his left wrist. Patient is a dialysis patient. Normally dialyzed Monday Wednesday and Friday. His fistula it is in his left forearm. They have been dialyzing him. Patient is concerned about a blood clot in the left wrist. No fall or injury. No fevers. Patient has had a past history of gout. Patient also had a study 2 weeks ago to study his AV fistula to get some of the clots out of there. They did not enter through his wrist to evaluate this. Patient denies any chest pain shortness of breath or any fevers.      Past Medical History:  Diagnosis Date  . Anemia of chronic disease   . Arthritis   . CAD (coronary artery disease)    Moderate multivessel disease 07/2015 - managed medically  . ESRD on hemodialysis [facility])    M/W/F in [place] - Dr. Florene Glen  . Essential hypertension   . Gout   . History of blood transfusion   . History of myopericarditis 2015   07/2015  . History of pneumonia 2014  . History of stroke   . Hypercholesterolemia   . Nonischemic cardiomyopathy (HCC)    LVEF 35-40%  . Peripheral vascular disease ([place])    Status post right below knee amputation for a nonhealing wound of the right foot 12/2014  . Pneumonia     Patient Active Problem List   Diagnosis Date Noted  . Hypertriglyceridemia   . Hypokalemia   . Hypomagnesemia   . Gastrointestinal hemorrhage associated with anorectal source   . CAD in native artery   . Aortic stenosis   . Coronary artery disease involving native coronary artery of native heart with angina pectoris ([place])   . Hyperlipidemia   . Cardiomyopathy, ischemic   . GI bleed  03/03/2016  . Hyperglycemia 03/03/2016  . GI bleeding 03/03/2016  . Elevated troponin   . Hypertensive urgency 11/09/2015  . Hypertensive urgency, malignant 11/08/2015  . Malnutrition of moderate degree 11/08/2015  . Adjustment disorder with depressed mood   . Constipation due to pain medication   . Slow transit constipation   . Unilateral complete BKA ([place]) 10/23/2015  . Labile blood pressure   . Muscle stiffness   . S/P AKA (above knee amputation) unilateral ([place])   . History of right above knee amputation ([place])   . Benign essential HTN   . Unilateral AKA ([place]) 10/12/2015  . Abnormality of gait   . Status post above knee amputation of left lower extremity ([place])   . Chronic combined systolic and diastolic congestive heart failure ([place])   . ESRD on dialysis ([place])   . Essential hypertension   . Post-operative pain   . Peripheral vascular disease with pain at rest [facility]) 10/10/2015  . Chest pain 08/04/2015  . PD catheter dysfunction ([place]) 03/13/2015  . Cellulitis and abscess of foot   . Kidney mass   . Other emphysema ([place])   . Acute blood loss anemia   . Physical deconditioning   . S/P BKA (below knee amputation) unilateral ([place])   . Renal mass 01/01/2015  .  Foot pain 12/30/2014  . Foot pain, right 12/30/2014  . CHF (congestive heart failure) ([place]) 12/30/2014  . CAD (coronary artery disease) 12/30/2014  . Hyperkalemia 12/30/2014  . ESRD (end stage renal disease) on dialysis ([place])   . Blood loss anemia   . Lower GI bleed 09/21/2014  . Fluid overload 08/29/2014  . Acute on chronic combined systolic and diastolic CHF (congestive heart failure) ([place]) 08/29/2014  . Acute respiratory failure with hypoxia ([place]) 08/29/2014  . Acute respiratory failure with hypoxemia ([place]) 08/29/2014  . Acute myopericarditis 08/18/2014  . Anemia of chronic disease   . End stage renal disease, has been peritoneal, now hemodialysis 07/28/2012  . Secondary cardiomyopathy ([place]) 06/26/2012  . HLD  (hyperlipidemia) 12/10/2006  . Hypertensive heart disease 08/18/2006  . OSTEOARTHRITIS 08/18/2006  . Polycystic kidney 08/18/2006    Past Surgical History:  Procedure Laterality Date  . ABOVE KNEE LEG AMPUTATION Left 10/10/2015  . AMPUTATION Right 01/05/2015   BKA  . AMPUTATION Left 10/10/2015   Procedure: AMPUTATION ABOVE KNEE LEFT;  Surgeon: Angelia Mould, MD;  Location: [facility];  Service: Vascular;  Laterality: Left;  . AV FISTULA PLACEMENT  08/24/2012   Procedure: ARTERIOVENOUS (AV) FISTULA CREATION;  Surgeon: Rosetta Posner, MD;  Location: [facility];  Service: Vascular;  Laterality: Left;  . BACK SURGERY    . CAPD REMOVAL N/A 03/13/2015   Procedure: CONTINUOUS AMBULATORY PERITONEAL DIALYSIS  (CAPD) CATHETER REMOVAL;  Surgeon: Coralie Keens, MD;  Location: [facility];  Service: General;  Laterality: N/A;  . CARDIAC CATHETERIZATION    . CHOLECYSTECTOMY    . COLONOSCOPY     remote past by Dr. Tamala Julian in [place], [place]   . COLONOSCOPY Left 09/26/2014   Dr. Paulita Fujita: old and semi-fresh blood scattered throughout the colon, several medium-sized diverticula, no obvious focal bleeding site, no blood in TI, several polyps seen in cecum, ascending colon and proximal transverse s/p biopsy (tubular adenomas)  . COLONOSCOPY N/A 03/05/2016   Procedure: COLONOSCOPY;  Surgeon: Danie Binder, MD;  [facility];  Service: Endoscopy;  Laterality: N/A;  . ESOPHAGOGASTRODUODENOSCOPY N/A 03/05/2016   Procedure: ESOPHAGOGASTRODUODENOSCOPY (EGD);  Surgeon: Danie Binder, MD;  [facility];  Service: Endoscopy;  Laterality: N/A;  . GANGLION CYST EXCISION  01/03/2012   Procedure: REMOVAL GANGLION OF WRIST;  Surgeon: Scherry Ran, MD;  Location: [facility];  Service: General;  Laterality: Right;  . GIVENS CAPSULE STUDY N/A 03/07/2016   Procedure: GIVENS CAPSULE STUDY;  Surgeon: Clarene Essex, MD;  Location: [facility] ENDOSCOPY;  Service: Endoscopy;  Laterality: N/A;  . HEMORRHOID SURGERY  1970's  .  INSERTION OF DIALYSIS CATHETER Right     neck  . INSERTION OF DIALYSIS CATHETER  10/19/2012   Procedure: INSERTION OF DIALYSIS CATHETER;  Surgeon: Rosetta Posner, MD;  Location: [facility];  Service: Vascular;  Laterality: N/A;  [place] ARTHROSCOPY Right 2007  . LEFT HEART CATHETERIZATION WITH CORONARY ANGIOGRAM N/A 08/17/2014   Procedure: LEFT HEART CATHETERIZATION WITH CORONARY ANGIOGRAM;  Surgeon: Larey Dresser, MD;  Location: [facility] CATH LAB;  Service: Cardiovascular;  Laterality: N/A;  . [place] SURGERY  2004       Home Medications    Prior to Admission medications   Medication Sig Start Date End Date Taking? Authorizing Provider  Amino Acids-Protein Hydrolys (FEEDING SUPPLEMENT, PRO-STAT SUGAR FREE 64,) LIQD Take 30 mLs by mouth 2 (two) times daily. 03/14/16   Lavina Hamman, MD  amLODipine (NORVASC) 10 MG tablet Take 0.5 tablets (5 mg total) by mouth daily. 10/31/15   Bary Leriche, PA-C  atorvastatin (LIPITOR) 80 MG tablet Take 1 tablet (80 mg total) by mouth daily. 11/02/15   Arnoldo Lenis, MD  calcitRIOL (ROCALTROL) 0.5 MCG capsule Take 1 capsule (0.5 mcg total) by mouth daily. 11/10/15   Lucia Gaskins, MD  carvedilol (COREG) 3.125 MG tablet Take 1 tablet (3.125 mg total) by mouth 2 (two) times daily with a meal. 03/14/16   Lavina Hamman, MD  docusate sodium (COLACE) 100 MG capsule Take 2 capsules (200 mg total) by mouth daily. Patient taking differently: Take 200 mg by mouth daily as needed for mild constipation or moderate constipation.  10/31/15   Bary Leriche, PA-C  feeding supplement (BOOST / RESOURCE BREEZE) LIQD Take 1 Container by mouth 4 (four) times daily -  with meals and at bedtime. 03/14/16   Lavina Hamman, MD  gabapentin (NEURONTIN) 100 MG capsule Take 1 capsule (100 mg total) by mouth at bedtime. 10/23/15   Barton Dubois, MD  HYDROcodone-acetaminophen (NORCO/VICODIN) 5-325 MG tablet Take 1-2 tablets by mouth 2 (two) times daily. 05/04/16   Fredia Sorrow, MD  isosorbide mononitrate (IMDUR) 30 MG 24 hr tablet Take 0.5 tablets (15 mg total) by mouth at bedtime. 09/20/15   Arnoldo Lenis, MD  lidocaine-prilocaine (EMLA) cream Apply 1 application topically every Monday, Wednesday, and Friday. For dialysis 10/06/15   Historical Provider, MD  LUMIGAN 0.01 % SOLN Place 1 drop into both eyes at bedtime. 09/18/15   Historical Provider, MD  multivitamin (RENA-VIT) TABS tablet Take 1 tablet by mouth every Monday, Wednesday, and Friday. Takes on dialysis days    Historical Provider, MD  nitroGLYCERIN (NITROSTAT) 0.4 MG SL tablet Place 1 tablet (0.4 mg total) under the tongue every 5 (five) minutes as needed for chest pain. 12/20/15   Ripudeep Krystal Eaton, MD  oxyCODONE (OXY IR/ROXICODONE) 5 MG immediate release tablet Take 1 tablet (5 mg total) by mouth every 6 (six) hours as needed for moderate pain or severe pain. 10/31/15   Ivan Anchors Love, PA-C  pantoprazole (PROTONIX) 40 MG tablet Take 1 tablet (40 mg total) by mouth daily at 12 noon. 10/23/15   Barton Dubois, MD  polyethylene glycol [facility] / GLYCOLAX) packet Take 17 g by mouth 2 (two) times daily. 03/14/16   Lavina Hamman, MD  polyvinyl alcohol (LIQUIFILM TEARS) 1.4 % ophthalmic solution Place 1 drop into both eyes as needed for dry eyes. 10/31/15   Bary Leriche, PA-C  sevelamer carbonate (RENVELA) 800 MG tablet Take 1,600-2,400 mg by mouth See admin instructions. Take 3 tablets (2400 mg) with meals and 2 tablets (1600 mg) with snacks    Historical Provider, MD  SIMBRINZA 1-0.2 % SUSP Place 1 drop into both eyes 2 (two) times daily. 09/18/15   Historical Provider, MD    Family History Family History  Problem Relation Age of Onset  . Stroke Mother   . Arthritis    . Cancer    . Kidney disease    . Cancer Sister   . Colon cancer Brother     unclear   . Colon cancer Brother   . Anesthesia problems Neg Hx   . Hypotension Neg Hx   . Malignant hyperthermia Neg Hx   . Pseudochol deficiency Neg Hx      Social History Social History  Substance Use Topics  . Smoking status: Former Smoker  Packs/day: 1.00    Years: 30.00    Types: Cigarettes    Quit date: 08/19/1998  . Smokeless tobacco: Former Systems developer    Types: Chew    Quit date: 12/31/1993  . Alcohol use No     Allergies   Review of patient's allergies indicates no known allergies.   Review of Systems Review of Systems  Constitutional: Negative for fever.  HENT: Negative for congestion.   Respiratory: Negative for shortness of breath.   Cardiovascular: Negative for chest pain.  Gastrointestinal: Negative for abdominal pain.  Musculoskeletal: Positive for joint swelling. Negative for back pain and neck pain.  Skin: Negative for rash.  Allergic/Immunologic: Positive for immunocompromised state.  Neurological: Negative for headaches.  Hematological: Bruises/bleeds easily.  Psychiatric/Behavioral: Negative for confusion.     Physical Exam Updated Vital Signs BP 153/63 (BP Location: Right Arm)   Pulse 78   Temp 98.6 F (37 C) (Oral)   Resp 20   Ht 5\' 5"  (1.651 m)   Wt 61.2 kg   SpO2 96%   BMI 22.47 kg/m   Physical Exam  Constitutional: He is oriented to person, place, and time. He appears well-developed and well-nourished.  HENT:  Head: Normocephalic and atraumatic.  Mouth/Throat: Oropharynx is clear and moist.  Eyes: EOM are normal. Pupils are equal, round, and reactive to light.  Neck: Normal range of motion.  Cardiovascular: Normal rate, regular rhythm and normal heart sounds.   Pulmonary/Chest: Effort normal and breath sounds normal.  Abdominal: Soft. Bowel sounds are normal. There is no tenderness.  Musculoskeletal: He exhibits edema and tenderness.  Patient with bilateral lower extremity amputation left side is above the knee right side is below the knee. Patient with an area of redness and tenderness and swelling to the dorsum of the left hand over the wrist. Some increased pain with range of motion.  No significant pain with movement of the fingers. Patient has AV fistula in the left forearm. Which is functioning fine. Superficial veins on the back of the wrist and hand without any evidence of thrombosis or phlebitis.  Neurological: He is alert and oriented to person, place, and time. No cranial nerve deficit.  Skin: There is erythema.  Nursing note and vitals reviewed.    ED Treatments / Results  Labs (all labs ordered are listed, but only abnormal results are displayed) Labs Reviewed - No data to display  EKG  EKG Interpretation None       Radiology No results found.  Procedures Procedures (including critical care time)  Medications Ordered in ED Medications  HYDROcodone-acetaminophen (NORCO/VICODIN) 5-325 MG per tablet 1 tablet (not administered)     Initial Impression / Assessment and Plan / ED Course  I have reviewed the triage vital signs and the nursing notes.  Pertinent labs & imaging results that were available during my care of the patient were reviewed by me and considered in my medical decision making (see chart for details).  Clinical Course    Patient with a past history of gout. Patient with left wrist redness inflammation and tenderness on the dorsum of the wrist. Clinically consistent with gout. No significant swelling of the fingers no real concern for any deep vein thrombosis. Patient's AV fistula in that forearm is working fine. No evidence of any thrombophlebitis either on exam.  Will treat patient for gout with hydrocodone him follow-up with his primary care doctor. At reduced dose since he is a dialysis patient. Patient is okay for continuing dialysis on Monday  Wednesday and Friday. As a matter fact the dialysis team is continue to dialyze him all week although the symptoms of been present there since Monday. This was appropriate.  Final Clinical Impressions(s) / ED Diagnoses   Final diagnoses:  Acute gout of left wrist, unspecified cause     New Prescriptions New Prescriptions   HYDROCODONE-ACETAMINOPHEN (NORCO/VICODIN) 5-325 MG TABLET    Take 1-2 tablets by mouth 2 (two) times daily.     Fredia Sorrow, MD 05/04/16 2127

## 2016-05-04 NOTE — Discharge Instructions (Signed)
taken pain medicine as directed. Continue your dialysis schedule Monday Wednesday and Friday. Make an appointment to follow-up with your regular doctor. Suspect that this is gout. Return for any new or worse symptoms. Return for any fevers.

## 2016-06-18 ENCOUNTER — Encounter: Payer: Self-pay | Admitting: Cardiology

## 2016-06-18 ENCOUNTER — Ambulatory Visit (INDEPENDENT_AMBULATORY_CARE_PROVIDER_SITE_OTHER): Payer: Medicare Other | Admitting: Cardiology

## 2016-06-18 VITALS — BP 136/58 | HR 78

## 2016-06-18 DIAGNOSIS — I5042 Chronic combined systolic (congestive) and diastolic (congestive) heart failure: Secondary | ICD-10-CM

## 2016-06-18 DIAGNOSIS — E785 Hyperlipidemia, unspecified: Secondary | ICD-10-CM | POA: Diagnosis not present

## 2016-06-18 DIAGNOSIS — I4891 Unspecified atrial fibrillation: Secondary | ICD-10-CM

## 2016-06-18 DIAGNOSIS — I350 Nonrheumatic aortic (valve) stenosis: Secondary | ICD-10-CM | POA: Diagnosis not present

## 2016-06-18 DIAGNOSIS — I2510 Atherosclerotic heart disease of native coronary artery without angina pectoris: Secondary | ICD-10-CM | POA: Diagnosis not present

## 2016-06-18 NOTE — Progress Notes (Signed)
Clinical Summary Mr. Duby is a 82 y.o.male seen today for follow up of the following medical problems  1. Chronic combined systolic/diastolic HF - echo [id number] LVEF 30-35% with restrictive diasotlic function - echo [id number] LVEF 45-50%  - coreg reduced to 3.125mg  bid due to some bradycardia during admission 02/2016 - no recent SOB. No recent edema.  - compliant with meds  2. ESRD - MWF goes to HD   3. HTN - occasional high bp's, but rare.    4. CAD - cath 07/2014 with diffuse mainly moderate disease, most significnat disease 80% ramus that was managed medically - he has had multiple admits with chest pain in the setting of volume overload or severe HTN with troponin elevation, symptoms resolve after HD or with bp control  - can have some chest pain with high bp episodes only.   5. Hyperlipidemia - compliant with statin  6. Aortic stenosis -echo 02/3016 mean grad 27, reported area of 0.97 but dimensionless index 0.3. Findings most consistent with moderate AS.  - no recent SOB or DOE. No chest pain or syncope.    7. PAD - followed by vascular, s/p amputation of bother right and left lower extremities  8. Afib - no anticoag due to history of  Gi bleeds. Admit 02/2016 with severe GI bleed only on ASA, this was stopped at that time.  - no recent palpitations since last visit.   9. GI bleed - admit 02/2016 with diverticular bleed.  - received 9 units of pRBCs - aspiring was discontinued    - denies any recent bleeding.  Past Medical History:  Diagnosis Date  . Anemia of chronic disease   . Arthritis   . CAD (coronary artery disease)    Moderate multivessel disease 07/2015 - managed medically  . ESRD on hemodialysis [facility])    M/W/F in [place] - Dr. Florene Glen  . Essential hypertension   . Gout   . History of blood transfusion   . History of myopericarditis 2015   07/2015  . History of pneumonia 2014  . History of stroke   . Hypercholesterolemia   .  Nonischemic cardiomyopathy (HCC)    LVEF 35-40%  . Peripheral vascular disease ([place])    Status post right below knee amputation for a nonhealing wound of the right foot 12/2014  . Pneumonia      No Known Allergies   Current Outpatient Prescriptions  Medication Sig Dispense Refill  . Amino Acids-Protein Hydrolys (FEEDING SUPPLEMENT, PRO-STAT SUGAR FREE 64,) LIQD Take 30 mLs by mouth 2 (two) times daily. 900 mL 0  . amLODipine (NORVASC) 10 MG tablet Take 0.5 tablets (5 mg total) by mouth daily.    Marland Kitchen atorvastatin (LIPITOR) 80 MG tablet Take 1 tablet (80 mg total) by mouth daily. 90 tablet 3  . calcitRIOL (ROCALTROL) 0.5 MCG capsule Take 1 capsule (0.5 mcg total) by mouth daily. 30 capsule 3  . carvedilol (COREG) 3.125 MG tablet Take 1 tablet (3.125 mg total) by mouth 2 (two) times daily with a meal. 60 tablet 0  . docusate sodium (COLACE) 100 MG capsule Take 2 capsules (200 mg total) by mouth daily. (Patient taking differently: Take 200 mg by mouth daily as needed for mild constipation or moderate constipation. ) 10 capsule 0  . feeding supplement (BOOST / RESOURCE BREEZE) LIQD Take 1 Container by mouth 4 (four) times daily -  with meals and at bedtime. 14 Container 0  . gabapentin (NEURONTIN) 100 MG capsule Take  1 capsule (100 mg total) by mouth at bedtime.    Marland Kitchen HYDROcodone-acetaminophen (NORCO/VICODIN) 5-325 MG tablet Take 1-2 tablets by mouth 2 (two) times daily. 14 tablet 0  . isosorbide mononitrate (IMDUR) 30 MG 24 hr tablet Take 0.5 tablets (15 mg total) by mouth at bedtime. 45 tablet 3  . lidocaine-prilocaine (EMLA) cream Apply 1 application topically every Monday, Wednesday, and Friday. For dialysis  10  . LUMIGAN 0.01 % SOLN Place 1 drop into both eyes at bedtime.  11  . multivitamin (RENA-VIT) TABS tablet Take 1 tablet by mouth every Monday, Wednesday, and Friday. Takes on dialysis days    . nitroGLYCERIN (NITROSTAT) 0.4 MG SL tablet Place 1 tablet (0.4 mg total) under the tongue  every 5 (five) minutes as needed for chest pain. 30 tablet 12  . oxyCODONE (OXY IR/ROXICODONE) 5 MG immediate release tablet Take 1 tablet (5 mg total) by mouth every 6 (six) hours as needed for moderate pain or severe pain. 50 tablet 0  . pantoprazole (PROTONIX) 40 MG tablet Take 1 tablet (40 mg total) by mouth daily at 12 noon.    . polyethylene glycol (MIRALAX / GLYCOLAX) packet Take 17 g by mouth 2 (two) times daily. 14 each 0  . polyvinyl alcohol (LIQUIFILM TEARS) 1.4 % ophthalmic solution Place 1 drop into both eyes as needed for dry eyes. 15 mL 0  . sevelamer carbonate (RENVELA) 800 MG tablet Take 1,600-2,400 mg by mouth See admin instructions. Take 3 tablets (2400 mg) with meals and 2 tablets (1600 mg) with snacks    . SIMBRINZA 1-0.2 % SUSP Place 1 drop into both eyes 2 (two) times daily.  11   No current facility-administered medications for this visit.      Past Surgical History:  Procedure Laterality Date  . ABOVE KNEE LEG AMPUTATION Left 10/10/2015  . AMPUTATION Right 01/05/2015   BKA  . AMPUTATION Left 10/10/2015   Procedure: AMPUTATION ABOVE KNEE LEFT;  Surgeon: Angelia Mould, MD;  Location: [facility];  Service: Vascular;  Laterality: Left;  . AV FISTULA PLACEMENT  08/24/2012   Procedure: ARTERIOVENOUS (AV) FISTULA CREATION;  Surgeon: Rosetta Posner, MD;  Location: [facility];  Service: Vascular;  Laterality: Left;  . BACK SURGERY    . CAPD REMOVAL N/A 03/13/2015   Procedure: CONTINUOUS AMBULATORY PERITONEAL DIALYSIS  (CAPD) CATHETER REMOVAL;  Surgeon: Coralie Keens, MD;  Location: [facility];  Service: General;  Laterality: N/A;  . CARDIAC CATHETERIZATION    . CHOLECYSTECTOMY    . COLONOSCOPY     remote past by Dr. Tamala Julian in [place], [place]   . COLONOSCOPY Left 09/26/2014   Dr. Paulita Fujita: old and semi-fresh blood scattered throughout the colon, several medium-sized diverticula, no obvious focal bleeding site, no blood in TI, several polyps seen in cecum, ascending colon and proximal  transverse s/p biopsy (tubular adenomas)  . COLONOSCOPY N/A 03/05/2016   Procedure: COLONOSCOPY;  Surgeon: Danie Binder, MD;  [facility];  Service: Endoscopy;  Laterality: N/A;  . ESOPHAGOGASTRODUODENOSCOPY N/A 03/05/2016   Procedure: ESOPHAGOGASTRODUODENOSCOPY (EGD);  Surgeon: Danie Binder, MD;  [facility];  Service: Endoscopy;  Laterality: N/A;  . GANGLION CYST EXCISION  01/03/2012   Procedure: REMOVAL GANGLION OF WRIST;  Surgeon: Scherry Ran, MD;  Location: [facility];  Service: General;  Laterality: Right;  . GIVENS CAPSULE STUDY N/A 03/07/2016   Procedure: GIVENS CAPSULE STUDY;  Surgeon: Clarene Essex, MD;  Location: [facility] ENDOSCOPY;  Service: Endoscopy;  Laterality: N/A;  . HEMORRHOID SURGERY  1970's  . INSERTION OF DIALYSIS CATHETER Right     neck  . INSERTION OF DIALYSIS CATHETER  10/19/2012   Procedure: INSERTION OF DIALYSIS CATHETER;  Surgeon: Rosetta Posner, MD;  Location: [place] [facility];  Service: Vascular;  Laterality: N/A;  [place] ARTHROSCOPY Right 2007  . LEFT HEART CATHETERIZATION WITH CORONARY ANGIOGRAM N/A 08/17/2014   Procedure: LEFT HEART CATHETERIZATION WITH CORONARY ANGIOGRAM;  Surgeon: Larey Dresser, MD;  Location: [facility] CATH LAB;  Service: Cardiovascular;  Laterality: N/A;  . LUMBAR [place] SURGERY  2004     No Known Allergies    Family History  Problem Relation Age of Onset  . Stroke Mother   . Arthritis    . Cancer    . Kidney disease    . Cancer Sister   . Colon cancer Brother     unclear   . Colon cancer Brother   . Anesthesia problems Neg Hx   . Hypotension Neg Hx   . Malignant hyperthermia Neg Hx   . Pseudochol deficiency Neg Hx      Social History Mr. Allie reports that he quit smoking about 17 years ago. His smoking use included Cigarettes. He has a 30.00 pack-year smoking history. He quit smokeless tobacco use about 22 years ago. His smokeless tobacco use included Chew. Mr. Mergel reports that he does not drink  alcohol.   Review of Systems CONSTITUTIONAL: No weight loss, fever, chills, weakness or fatigue.  HEENT: Eyes: No visual loss, blurred vision, double vision or yellow sclerae.No hearing loss, sneezing, congestion, runny nose or sore throat.  SKIN: No rash or itching.  CARDIOVASCULAR: per HPI RESPIRATORY: No shortness of breath, cough or sputum.  GASTROINTESTINAL: No anorexia, nausea, vomiting or diarrhea. No abdominal pain or blood.  GENITOURINARY: No burning on urination, no polyuria NEUROLOGICAL: No headache, dizziness, syncope, paralysis, ataxia, numbness or tingling in the extremities. No change in bowel or bladder control.  MUSCULOSKELETAL: No muscle, back pain, joint pain or stiffness.  LYMPHATICS: No enlarged nodes. No history of splenectomy.  PSYCHIATRIC: No history of depression or anxiety.  ENDOCRINOLOGIC: No reports of sweating, cold or heat intolerance. No polyuria or polydipsia.  Marland Kitchen   Physical Examination Vitals:   06/18/16 0946  BP: (!) 136/58  Pulse: 78   There were no vitals filed for this visit.  Gen: resting comfortably, no acute distress HEENT: no scleral icterus, pupils equal round and reactive, no palptable cervical adenopathy,  CV: irreg, 2/6 systolic murmur RUSB, no jvd Resp: Clear to auscultation bilaterally GI: abdomen is soft, non-tender, non-distended, normal bowel sounds, no hepatosplenomegaly MSK: extremities are warm, bilateral LE amputations Skin: warm, no rash Neuro:  no focal deficits Psych: appropriate affect   Diagnostic Studies 07/2015 echo Study Conclusions  - Left ventricle: The cavity size was normal. Wall thickness was increased increased in a pattern of mild to moderate LVH. Incidentally noted transverse false tendon in LV. Systolic function was moderately to severely reduced. The estimated ejection fraction was in the range of 30% to 35%. Diffuse hypokinesis. There is severe hypokinesis of the basal-midinferolateral  and inferior myocardium. Doppler parameters are consistent with restrictive physiology, indicative of decreased left ventricular diastolic compliance and/or increased left atrial pressure. - Aortic valve: Moderately calcified annulus. Trileaflet; mildly calcified leaflets. Cusp separation was reduced. There was moderate stenosis. There was trivial regurgitation. Mean gradient (S): 16 mm Hg. VTI ratio of LVOT to aortic valve: 0.4. Valve area (  VTI): 1.24 cm^2. Valve area (Vmax): 1.21 cm^2. - Mitral valve: Calcified annulus. There was trivial regurgitation. - Left atrium: The atrium was moderately dilated. - Right atrium: The atrium was moderately to severely dilated. Central venous pressure (est): 3 mm Hg. - Atrial septum: No defect or patent foramen ovale was identified. - Tricuspid valve: There was mild regurgitation. - Pulmonary arteries: PA peak pressure: 38 mm Hg (S). - Pericardium, extracardiac: There was no pericardial effusion.  Impressions:  - Mild to moderate LVH with LVEF approximately 30%, diffuse hypokinesis, most severe in the mid to basal inferior and inferolateral wall. Diastolic filling pattern consistent with restrictive physiology. Moderate left atrial enlargement. Moderate calcific aortic stenosis with trivial aortic regurgitation. Mild tricuspid regurgitation with PASP 38 mmHg. Moderate to severe right atrial enlargement. Compared to the previous study from December 2015, there has been further reduction in LVEF, and progression in degree of aortic stenosis.   07/2014 cath Procedural Findings: Hemodynamics:  AO 129/76  Coronary angiography: Coronary dominance: right  Left mainstem: Short, no significant disease.   Left anterior descending (LAD): There was a moderate D1 that branched early with 40% ostial stenosis. This was followed by a large septal perforator. There was 40-50% proximal LAD stenosis at the  takeoff of the septal perforator. 30% mid LAD stenosis at D2.   Left circumflex (LCx): There was a large ramus that branched early into a superior and and inferior division, both vessels were moderate in size. Both divisions of the ramus had relatively up to 80% proximal stenosis. The AV LCx itself had luminal irregularities.   Right coronary artery (RCA): There was an early large acute marginal with 40% proximal and 40% mid vessel stenosis. The RCA had diffuse luminal irregularities and up to 40% mid vessel stenosis.   Left ventriculography: Not done, echo was done earlier today.   Final Conclusions: Most significant disease appeared to be 80% proximal stenoses in the moderate superior and inferior divisions of the ramus. These lesions do not look like culprits for ACS (plaque rupture). I think that the main process here is likely myopericarditis in the setting of renal disease and ?inadequate dialysis (does PD at home). I will continue high dose aspirin 650 q8 hrs and will give 1 dose of colchicine. Stop heparin drip with small percardial effusion and cycle troponin to peak.    02/2016 echo Study Conclusions  - Left ventricle: posterior lateral hypokinesis. The cavity size   was moderately dilated. Wall thickness was normal. Systolic   function was mildly reduced. The estimated ejection fraction was   in the range of 45% to 50%. Doppler parameters are consistent   with elevated ventricular end-diastolic filling pressure. - Aortic valve: There was moderate stenosis. There was mild   regurgitation. Valve area (VTI): 0.92 cm^2. Valve area (Vmax):   0.84 cm^2. Valve area (Vmean): 0.76 cm^2. - Mitral valve: There was mild regurgitation. - Left atrium: The atrium was moderately dilated. - Atrial septum: No defect or patent foramen ovale was identified.   Assessment and Plan  1. Chronic combined systolic/diastolic HF - LVEF has improved on medical therapy - no current  symptoms - continue current meds  2. ESRD - continue HD per renal.   3. CAD - multiple recent admits with hypertensive emergency, mild troponin elevation. SInce bp's have been controlled no recurrent symptoms - we will continue to monitor  4. Hyperlipidemia - in setting of PAD and CAD we will conitnue high dose statin .   5. Aortic  stenosis -moderate by most recent echo. Continue to monitor. Asymptomatic.   6. PAD - per vascular  7. HTN - at goal, we will continue current meds     F/u 3 months. Request labs from pcp      Arnoldo Lenis, M.D., F.A.C.C.

## 2016-06-18 NOTE — Patient Instructions (Signed)
Your physician recommends that you schedule a follow-up appointment in: 3 Months with Dr. Branch.   Your physician recommends that you continue on your current medications as directed. Please refer to the Current Medication list given to you today.  If you need a refill on your cardiac medications before your next appointment, please call your pharmacy.  Thank you for choosing [facility]!    

## 2016-06-24 ENCOUNTER — Encounter: Payer: Self-pay | Admitting: *Deleted

## 2016-07-10 ENCOUNTER — Other Ambulatory Visit: Payer: Self-pay | Admitting: Cardiology

## 2016-08-09 ENCOUNTER — Telehealth: Payer: Self-pay

## 2016-08-09 NOTE — Telephone Encounter (Signed)
rec'd call from pt's. caregiver.  Reported right BKA stump is "ice cold and has darker discoloration at end of stump."  Reported this has been present about 2 mos.  Stated the pt. is starting to have more pain in the stump.  Denied any swelling or nonhealing sores of stump.  Advised will schedule for an appt. To evaluate.  Discussed with Dr. Donzetta Matters.  Since pt. had a CT scan in 2016 that showed diffuse disease in all arteries in abdomen, pelvis and LE's, no studies needed prior to appt.  Will contact caregiver of appt.

## 2016-08-09 NOTE — Telephone Encounter (Signed)
Spoke to pt's niece to sch appt 08/12/16 at 9:45.

## 2016-08-12 ENCOUNTER — Ambulatory Visit (INDEPENDENT_AMBULATORY_CARE_PROVIDER_SITE_OTHER): Payer: Medicare Other | Admitting: Family

## 2016-08-12 ENCOUNTER — Encounter: Payer: Self-pay | Admitting: Family

## 2016-08-12 VITALS — BP 140/70 | HR 74 | Temp 97.6°F | Resp 20

## 2016-08-12 DIAGNOSIS — Z89612 Acquired absence of left leg above knee: Secondary | ICD-10-CM | POA: Diagnosis not present

## 2016-08-12 DIAGNOSIS — M79609 Pain in unspecified limb: Secondary | ICD-10-CM

## 2016-08-12 DIAGNOSIS — Z89511 Acquired absence of right leg below knee: Secondary | ICD-10-CM | POA: Diagnosis not present

## 2016-08-12 DIAGNOSIS — T8789 Other complications of amputation stump: Secondary | ICD-10-CM | POA: Diagnosis not present

## 2016-08-12 DIAGNOSIS — Z87891 Personal history of nicotine dependence: Secondary | ICD-10-CM

## 2016-08-12 DIAGNOSIS — N186 End stage renal disease: Secondary | ICD-10-CM

## 2016-08-12 DIAGNOSIS — Z992 Dependence on renal dialysis: Secondary | ICD-10-CM

## 2016-08-12 DIAGNOSIS — I779 Disorder of arteries and arterioles, unspecified: Secondary | ICD-10-CM | POA: Diagnosis not present

## 2016-08-12 DIAGNOSIS — G8918 Other acute postprocedural pain: Secondary | ICD-10-CM

## 2016-08-12 NOTE — Patient Instructions (Signed)

## 2016-08-12 NOTE — Progress Notes (Signed)
[facility] [place]   CC: Follow up peripheral artery occlusive disease  History of Present Illness Randy [name] is a 82 y.o. male patient of Dr. Scot Dock who is s/p right below-the-knee amputation on 01/05/2015.  He is also s/p Left above-the-knee amputation on 10/10/15 by Dr. Scot Dock for ischemic left leg.  He returns today after call received from pt's. caregiver.  Reported right BKA stump is "ice cold and has darker discoloration at end of stump."  Reported this has been present about 2 mos.  Stated the pt. is starting to have more pain in the stump.  Denied any swelling or nonhealing sores of stump. He reports that yesterday he started having intermiitent sharp shooting pain from his lower thigh to the endo of his right BKA stump. He denies any injury. States the lower right leg has hurt since before surgery.  Pt denies any problems with his left AKA.   [name] left AKA stump incision is well healed.  He denies fever or chills. He was working with [employer] to get his right BKA prosthesis to fit better, but has not used his prosthetic for several months due to pain in right stump with use.  Of note, he has undergone a previous CT angiogram which was done on 12/31/2014. This showed diffuse atherosclerotic disease involving the aorta, iliac arteries, and also diffuse infrainguinal arterial occlusive disease. His only runoff bilaterally was the peroneal arteries. At the time of his last visit Dr. Scot Dock did not think he was a candidate for revascularization. He has end-stage renal disease on dialysis (M-W-F via left arm AVF), coronary artery disease, congestive heart failure, and is on home O2.   Pt Diabetic: No Pt smoker: former smoker, quit in 2007  Pt meds include: Statin :Yes Betablocker: Yes ASA: No Other anticoagulants/antiplatelets: no  Past Medical History:  Diagnosis Date  . Anemia of chronic disease   . Arthritis   . CAD (coronary artery  disease)    Moderate multivessel disease 07/2015 - managed medically  . ESRD on hemodialysis [facility])    M/W/F in [place] - Dr. Florene Glen  . Essential hypertension   . Gout   . History of blood transfusion   . History of myopericarditis 2015   07/2015  . History of pneumonia 2014  . History of stroke   . Hypercholesterolemia   . Nonischemic cardiomyopathy (HCC)    LVEF 35-40%  . Peripheral vascular disease ([place])    Status post right below knee amputation for a nonhealing wound of the right foot 12/2014  . Pneumonia     Social History Social History  Substance Use Topics  . Smoking status: Former Smoker    Packs/day: 1.00    Years: 30.00    Types: Cigarettes    Quit date: 08/19/1998  . Smokeless tobacco: Former Systems developer    Types: Chew    Quit date: 12/31/1993  . Alcohol use No    Family History Family History  Problem Relation Age of Onset  . Stroke Mother   . Arthritis    . Cancer    . Kidney disease    . Cancer Sister   . Colon cancer Brother     unclear   . Colon cancer Brother   . Anesthesia problems Neg Hx   . Hypotension Neg Hx   . Malignant hyperthermia Neg Hx   . Pseudochol deficiency Neg Hx     Past Surgical History:  Procedure Laterality Date  . ABOVE KNEE  LEG AMPUTATION Left 10/10/2015  . AMPUTATION Right 01/05/2015   BKA  . AMPUTATION Left 10/10/2015   Procedure: AMPUTATION ABOVE KNEE LEFT;  Surgeon: Angelia Mould, MD;  Location: [place];  Service: Vascular;  Laterality: Left;  . AV FISTULA PLACEMENT  08/24/2012   Procedure: ARTERIOVENOUS (AV) FISTULA CREATION;  Surgeon: Rosetta Posner, MD;  Location: [place];  Service: Vascular;  Laterality: Left;  . BACK SURGERY    . CAPD REMOVAL N/A 03/13/2015   Procedure: CONTINUOUS AMBULATORY PERITONEAL DIALYSIS  (CAPD) CATHETER REMOVAL;  Surgeon: Coralie Keens, MD;  Location: [place];  Service: General;  Laterality: N/A;  . CARDIAC CATHETERIZATION    . CHOLECYSTECTOMY    . COLONOSCOPY     remote past by Dr. Tamala Julian  in [place], [place]   . COLONOSCOPY Left 09/26/2014   Dr. Paulita Fujita: old and semi-fresh blood scattered throughout the colon, several medium-sized diverticula, no obvious focal bleeding site, no blood in TI, several polyps seen in cecum, ascending colon and proximal transverse s/p biopsy (tubular adenomas)  . COLONOSCOPY N/A 03/05/2016   Procedure: COLONOSCOPY;  Surgeon: Danie Binder, MD;  [facility];  Service: Endoscopy;  Laterality: N/A;  . ESOPHAGOGASTRODUODENOSCOPY N/A 03/05/2016   Procedure: ESOPHAGOGASTRODUODENOSCOPY (EGD);  Surgeon: Danie Binder, MD;  [facility];  Service: Endoscopy;  Laterality: N/A;  . GANGLION CYST EXCISION  01/03/2012   Procedure: REMOVAL GANGLION OF WRIST;  Surgeon: Scherry Ran, MD;  Location: [facility];  Service: General;  Laterality: Right;  . GIVENS CAPSULE STUDY N/A 03/07/2016   Procedure: GIVENS CAPSULE STUDY;  Surgeon: Clarene Essex, MD;  Location: [facility] ENDOSCOPY;  Service: Endoscopy;  Laterality: N/A;  . HEMORRHOID SURGERY  1970's  . INSERTION OF DIALYSIS CATHETER Right     neck  . INSERTION OF DIALYSIS CATHETER  10/19/2012   Procedure: INSERTION OF DIALYSIS CATHETER;  Surgeon: Rosetta Posner, MD;  Location: [place];  Service: Vascular;  Laterality: N/A;  [place] ARTHROSCOPY Right 2007  . LEFT HEART CATHETERIZATION WITH CORONARY ANGIOGRAM N/A 08/17/2014   Procedure: LEFT HEART CATHETERIZATION WITH CORONARY ANGIOGRAM;  Surgeon: Larey Dresser, MD;  Location[facility] C[facility] LAB;  Service: Cardiovascular;  Laterality: N/A;  . LUMBAR [place] SURGERY  2004    No Known Allergies  Current Outpatient Prescriptions  Medication Sig Dispense Refill  . Amino Acids-Protein Hydrolys (FEEDING SUPPLEMENT, PRO-STAT SUGAR FREE 64,) LIQD Take 30 mLs by mouth 2 (two) times daily. 900 mL 0  . amLODipine (NORVASC) 10 MG tablet Take 0.5 tablets (5 mg total) by mouth daily.    Marland Kitchen atorvastatin (LIPITOR) 80 MG tablet Take 1 tablet (80 mg total) by mouth  daily. 90 tablet 3  . calcitRIOL (ROCALTROL) 0.5 MCG capsule Take 1 capsule (0.5 mcg total) by mouth daily. 30 capsule 3  . carvedilol (COREG) 3.125 MG tablet Take 1 tablet (3.125 mg total) by mouth 2 (two) times daily with a meal. 60 tablet 0  . docusate sodium (COLACE) 100 MG capsule Take 2 capsules (200 mg total) by mouth daily. (Patient taking differently: Take 200 mg by mouth daily as needed for mild constipation or moderate constipation. ) 10 capsule 0  . feeding supplement (BOOST / RESOURCE BREEZE) LIQD Take 1 Container by mouth 4 (four) times daily -  with meals and at bedtime. 14 Container 0  . gabapentin (NEURONTIN) 100 MG capsule Take 1 capsule (100 mg total) by mouth at bedtime.    Marland Kitchen  HYDROcodone-acetaminophen (NORCO/VICODIN) 5-325 MG tablet Take 1-2 tablets by mouth 2 (two) times daily. 14 tablet 0  . isosorbide mononitrate (IMDUR) 30 MG 24 hr tablet TAKE 1/2 TABLET BY MOUTH AT BEDTIME. 45 tablet 1  . lidocaine-prilocaine (EMLA) cream Apply 1 application topically every Monday, Wednesday, and Friday. For dialysis  10  . LUMIGAN 0.01 % SOLN Place 1 drop into both eyes at bedtime.  11  . multivitamin (RENA-VIT) TABS tablet Take 1 tablet by mouth every Monday, Wednesday, and Friday. Takes on dialysis days    . nitroGLYCERIN (NITROSTAT) 0.4 MG SL tablet Place 1 tablet (0.4 mg total) under the tongue every 5 (five) minutes as needed for chest pain. 30 tablet 12  . pantoprazole (PROTONIX) 40 MG tablet Take 1 tablet (40 mg total) by mouth daily at 12 noon.    . polyethylene glycol (MIRALAX / GLYCOLAX) packet Take 17 g by mouth 2 (two) times daily. 14 each 0  . polyvinyl alcohol (LIQUIFILM TEARS) 1.4 % ophthalmic solution Place 1 drop into both eyes as needed for dry eyes. 15 mL 0  . sevelamer carbonate (RENVELA) 800 MG tablet Take 1,600-2,400 mg by mouth See admin instructions. Take 3 tablets (2400 mg) with meals and 2 tablets (1600 mg) with snacks    . SIMBRINZA 1-0.2 % SUSP Place 1 drop into  both eyes 2 (two) times daily.  11   No current facility-administered medications for this visit.     ROS: See HPI for pertinent positives and negatives.   Physical Examination  Vitals:   08/12/16 0959  BP: 140/70  Pulse: 74  Resp: 20  Temp: 97.6 F (36.4 C)  TempSrc: Oral  SpO2: 93%   There is no height or weight on file to calculate BMI.  General: A&O x 3, WDWN, male. Gait: seated in w/c Eyes: PERRLA. Pulmonary: Respirations are non labored, CTAB, good air movement Cardiac: regular Rhythm, no detected murmur.         Carotid Bruits Right Left   Positive Negative  Aorta is not palpable. Radial pulses: palpable bilaterally                            VASCULAR EXAM: Extremities without ischemic changes, without Gangrene; without open wounds. Right BKA is normal warmth to touch, no discoloration. Small ( 0.6 cm x 0.8 cm) keratotic dried lesion at right BKA, no erythema, no swelling, no drainage.                                                                                                           LE Pulses Right Left       FEMORAL  not palpable seated in w/c  not palpable seated in w/c        POPLITEAL  not palpable  AKA       POSTERIOR TIBIAL  BKA AKA        DORSALIS PEDIS      ANTERIOR TIBIAL BKA AKA    Abdomen: soft, NT, no  palpable masses. Skin: no rashes, see Extremities. Musculoskeletal: no muscle wasting or atrophy.  Neurologic: A&O X 3; Appropriate Affect ; SENSATION: normal; MOTOR FUNCTION:  moving all extremities equally, motor strength 5/5 throughout. Speech is fluent/normal. CN 2-12 intact.    Non-Invasive Vascular Imaging: DATE: 08/12/2016 none   ASSESSMENT: ZAKIR REBER is a 82 y.o. male who is s/p right below-the-knee amputation on 01/05/2015.  He is also s/p Left above-the-knee amputation on 10/10/15 by Dr. Scot Dock for ischemic left leg. He c/o ongoing pain in right BKA, pain that started before the BKA, but yesterday it seem to have  gotten worse; he denies injury. He states the pain is intermittent. He is already taking Vicodin and gabapentin; he has ESRD and is on hemodialysis.  His right BKA stump has no signs of ischemia, has normal warmth to touch, no discoloration.  His niece is with him, states she looks in on him.   PLAN:  Based on the patient's vascular studies and examination, pt will follow up with Dr. Scot Dock at his first available office appointment to discuss new onset pain in right BKA stump, no signs of ischemia at stump.  I discussed in depth with the patient the nature of atherosclerosis, and emphasized the importance of maximal medical management including strict control of blood pressure, blood glucose, and lipid levels, obtaining regular exercise, and continued cessation of smoking.  The patient is aware that without maximal medical management the underlying atherosclerotic disease process will progress, limiting the benefit of any interventions.  The patient was given information about PAD including signs, symptoms, treatment, what symptoms should prompt the patient to seek immediate medical care, and risk reduction measures to take.  Randy Chambers, RN, MSN, FNP-C [facility] Phone: [phone]  Clinic MD: Donzetta Matters  08/12/16 10:13 AM

## 2016-08-13 ENCOUNTER — Encounter (HOSPITAL_COMMUNITY): Payer: Self-pay

## 2016-08-13 ENCOUNTER — Ambulatory Visit (HOSPITAL_COMMUNITY): Payer: Medicare Other | Admitting: Physical Therapy

## 2016-08-13 ENCOUNTER — Encounter: Payer: Self-pay | Admitting: Vascular Surgery

## 2016-08-14 ENCOUNTER — Ambulatory Visit (INDEPENDENT_AMBULATORY_CARE_PROVIDER_SITE_OTHER): Payer: Medicare Other | Admitting: Vascular Surgery

## 2016-08-14 ENCOUNTER — Encounter: Payer: Self-pay | Admitting: Vascular Surgery

## 2016-08-14 VITALS — BP 136/72 | HR 67 | Temp 97.3°F | Resp 18 | Ht 65.0 in | Wt 135.0 lb

## 2016-08-14 DIAGNOSIS — I779 Disorder of arteries and arterioles, unspecified: Secondary | ICD-10-CM | POA: Diagnosis not present

## 2016-08-14 NOTE — Progress Notes (Signed)
Patient name: Randy [name] MRN: [id number] DOB: 25-May-1932 Sex: male  REASON FOR VISIT: Pain and right below the knee amputation stump.  HPI: Randy [name] is a 82 y.o. male who presents with pain in the right below the knee amputation stump.  He had undergone a previous right below-the-knee amputation on 01/05/2015. Of note, he has undergone a previous CT angiogram which was done on 12/31/2014. This showed diffuse atherosclerotic disease involving the aorta, iliac arteries, and also diffuse infrainguinal arterial occlusive disease. His only runoff bilaterally was the peroneal arteries. He had presented with left foot pain in January 2017. I did not think he was a candidate for revascularization. Given that he had an amputation on the right side I felt that a left above-the-knee amputation was the best option. He underwent a left above-the-knee amputation on 10/10/2015. I last saw him on postop day 16 (10/26/2015) and his amputation site was healing nicely.  He was last seen by Vinnie Level Nickel on 08/12/2016, 2 days ago. He was complaining of pain in the right BKA stump. He continued to have some pain but it is better today.  He dialyzes on Monday Wednesdays and Fridays.  Past Medical History:  Diagnosis Date  . Anemia of chronic disease   . Arthritis   . CAD (coronary artery disease)    Moderate multivessel disease 07/2015 - managed medically  . ESRD on hemodialysis [facility])    M/W/F in [place] - Dr. Florene Glen  . Essential hypertension   . Gout   . History of blood transfusion   . History of myopericarditis 2015   07/2015  . History of pneumonia 2014  . History of stroke   . Hypercholesterolemia   . Nonischemic cardiomyopathy (HCC)    LVEF 35-40%  . Peripheral vascular disease ([place])    Status post right below knee amputation for a nonhealing wound of the right foot 12/2014  . Pneumonia     Family History  Problem Relation Age of Onset  . Stroke Mother   . Arthritis    . Cancer     . Kidney disease    . Cancer Sister   . Colon cancer Brother     unclear   . Colon cancer Brother   . Anesthesia problems Neg Hx   . Hypotension Neg Hx   . Malignant hyperthermia Neg Hx   . Pseudochol deficiency Neg Hx     SOCIAL HISTORY: Social History  Substance Use Topics  . Smoking status: Former Smoker    Packs/day: 1.00    Years: 30.00    Types: Cigarettes    Quit date: 08/19/1998  . Smokeless tobacco: Former Systems developer    Types: Chew    Quit date: 12/31/1993  . Alcohol use No    No Known Allergies  Current Outpatient Prescriptions  Medication Sig Dispense Refill  . Amino Acids-Protein Hydrolys (FEEDING SUPPLEMENT, PRO-STAT SUGAR FREE 64,) LIQD Take 30 mLs by mouth 2 (two) times daily. 900 mL 0  . amLODipine (NORVASC) 10 MG tablet Take 0.5 tablets (5 mg total) by mouth daily.    Marland Kitchen atorvastatin (LIPITOR) 80 MG tablet Take 1 tablet (80 mg total) by mouth daily. 90 tablet 3  . calcitRIOL (ROCALTROL) 0.5 MCG capsule Take 1 capsule (0.5 mcg total) by mouth daily. 30 capsule 3  . carvedilol (COREG) 3.125 MG tablet Take 1 tablet (3.125 mg total) by mouth 2 (two) times daily with a meal. 60 tablet 0  . docusate sodium (COLACE) 100 MG  capsule Take 2 capsules (200 mg total) by mouth daily. (Patient taking differently: Take 200 mg by mouth daily as needed for mild constipation or moderate constipation. ) 10 capsule 0  . feeding supplement (BOOST / RESOURCE BREEZE) LIQD Take 1 Container by mouth 4 (four) times daily -  with meals and at bedtime. 14 Container 0  . gabapentin (NEURONTIN) 100 MG capsule Take 1 capsule (100 mg total) by mouth at bedtime.    Marland Kitchen HYDROcodone-acetaminophen (NORCO/VICODIN) 5-325 MG tablet Take 1-2 tablets by mouth 2 (two) times daily. 14 tablet 0  . isosorbide mononitrate (IMDUR) 30 MG 24 hr tablet TAKE 1/2 TABLET BY MOUTH AT BEDTIME. 45 tablet 1  . lidocaine-prilocaine (EMLA) cream Apply 1 application topically every Monday, Wednesday, and Friday. For dialysis   10  . LUMIGAN 0.01 % SOLN Place 1 drop into both eyes at bedtime.  11  . multivitamin (RENA-VIT) TABS tablet Take 1 tablet by mouth every Monday, Wednesday, and Friday. Takes on dialysis days    . nitroGLYCERIN (NITROSTAT) 0.4 MG SL tablet Place 1 tablet (0.4 mg total) under the tongue every 5 (five) minutes as needed for chest pain. 30 tablet 12  . pantoprazole (PROTONIX) 40 MG tablet Take 1 tablet (40 mg total) by mouth daily at 12 noon.    . polyethylene glycol (MIRALAX / GLYCOLAX) packet Take 17 g by mouth 2 (two) times daily. 14 each 0  . polyvinyl alcohol (LIQUIFILM TEARS) 1.4 % ophthalmic solution Place 1 drop into both eyes as needed for dry eyes. 15 mL 0  . sevelamer carbonate (RENVELA) 800 MG tablet Take 1,600-2,400 mg by mouth See admin instructions. Take 3 tablets (2400 mg) with meals and 2 tablets (1600 mg) with snacks    . SIMBRINZA 1-0.2 % SUSP Place 1 drop into both eyes 2 (two) times daily.  11   No current facility-administered medications for this visit.     REVIEW OF SYSTEMS:  [X]  denotes positive finding, [ ]  denotes negative finding Cardiac  Comments:  Chest pain or chest pressure:    Shortness of breath upon exertion:    Short of breath when lying flat:    Irregular heart rhythm:        Vascular    Pain in calf, thigh, or hip brought on by ambulation:    Pain in feet at night that wakes you up from your sleep:     Blood clot in your veins:    Leg swelling:         Pulmonary    Oxygen at home:    Productive cough:     Wheezing:         Neurologic    Sudden weakness in arms or legs:     Sudden numbness in arms or legs:     Sudden onset of difficulty speaking or slurred speech:    Temporary loss of vision in one eye:     Problems with dizziness:         Gastrointestinal    Blood in stool:     Vomited blood:         Genitourinary    Burning when urinating:     Blood in urine:        Psychiatric    Major depression:         Hematologic    Bleeding  problems:    Problems with blood clotting too easily:        Skin    Rashes  or ulcers:        Constitutional    Fever or chills:      PHYSICAL EXAM: Vitals:   08/14/16 0928  BP: 136/72  Pulse: 67  Resp: 18  Temp: 97.3 F (36.3 C)  TempSrc: Oral  SpO2: 98%  Weight: 135 lb (61.2 kg)  Height: 5\' 5"  (1.651 m)    GENERAL: The patient is a well-nourished male, in no acute distress. The vital signs are documented above. CARDIAC: There is a regular rate and rhythm.  VASCULAR: The right BKA stump is cool.  PULMONARY: There is good air exchange bilaterally without wheezing or rales. SKIN:There is one very small eschar on the right BKA that is about 5 mm in diameter with no drainage or erythema. PSYCHIATRIC: The patient has a normal affect.  MEDICAL ISSUES:  PAIN RIGHT BELOW THE KNEE AMPUTATION STUMP: This patient has pain in his right below the knee amputation stump. His CT scan shows multilevel arterial occlusive disease and there are no options for revascularization. I have recommended above-the-knee amputation on the right if his pain is not tolerable. He feels that the pain is tolerable currently. He will call if his pain progresses.    [name] [facility] [place] [phone]

## 2016-08-19 ENCOUNTER — Encounter (HOSPITAL_COMMUNITY): Payer: Self-pay | Admitting: *Deleted

## 2016-08-19 ENCOUNTER — Inpatient Hospital Stay (HOSPITAL_COMMUNITY)
Admission: EM | Admit: 2016-08-19 | Discharge: 2016-08-21 | DRG: 377 | Disposition: A | Discharge status: Home / Self Care | Payer: Medicare Other | Attending: Family Medicine | Admitting: Family Medicine

## 2016-08-19 DIAGNOSIS — I482 Chronic atrial fibrillation, unspecified: Secondary | ICD-10-CM

## 2016-08-19 DIAGNOSIS — J9601 Acute respiratory failure with hypoxia: Secondary | ICD-10-CM

## 2016-08-19 DIAGNOSIS — I5042 Chronic combined systolic (congestive) and diastolic (congestive) heart failure: Secondary | ICD-10-CM | POA: Diagnosis present

## 2016-08-19 DIAGNOSIS — K5731 Diverticulosis of large intestine without perforation or abscess with bleeding: Secondary | ICD-10-CM | POA: Diagnosis present

## 2016-08-19 DIAGNOSIS — E871 Hypo-osmolality and hyponatremia: Secondary | ICD-10-CM | POA: Diagnosis present

## 2016-08-19 DIAGNOSIS — Q613 Polycystic kidney, unspecified: Secondary | ICD-10-CM

## 2016-08-19 DIAGNOSIS — E875 Hyperkalemia: Secondary | ICD-10-CM | POA: Diagnosis present

## 2016-08-19 DIAGNOSIS — Z9049 Acquired absence of other specified parts of digestive tract: Secondary | ICD-10-CM | POA: Diagnosis not present

## 2016-08-19 DIAGNOSIS — N2581 Secondary hyperparathyroidism of renal origin: Secondary | ICD-10-CM | POA: Diagnosis present

## 2016-08-19 DIAGNOSIS — Z89511 Acquired absence of right leg below knee: Secondary | ICD-10-CM

## 2016-08-19 DIAGNOSIS — I10 Essential (primary) hypertension: Secondary | ICD-10-CM | POA: Diagnosis not present

## 2016-08-19 DIAGNOSIS — Z823 Family history of stroke: Secondary | ICD-10-CM | POA: Diagnosis not present

## 2016-08-19 DIAGNOSIS — E7800 Pure hypercholesterolemia, unspecified: Secondary | ICD-10-CM | POA: Diagnosis present

## 2016-08-19 DIAGNOSIS — E8889 Other specified metabolic disorders: Secondary | ICD-10-CM | POA: Diagnosis present

## 2016-08-19 DIAGNOSIS — I428 Other cardiomyopathies: Secondary | ICD-10-CM | POA: Diagnosis present

## 2016-08-19 DIAGNOSIS — K449 Diaphragmatic hernia without obstruction or gangrene: Secondary | ICD-10-CM | POA: Diagnosis present

## 2016-08-19 DIAGNOSIS — D62 Acute posthemorrhagic anemia: Secondary | ICD-10-CM

## 2016-08-19 DIAGNOSIS — I739 Peripheral vascular disease, unspecified: Secondary | ICD-10-CM | POA: Diagnosis present

## 2016-08-19 DIAGNOSIS — Z89611 Acquired absence of right leg above knee: Secondary | ICD-10-CM | POA: Diagnosis not present

## 2016-08-19 DIAGNOSIS — Z89612 Acquired absence of left leg above knee: Secondary | ICD-10-CM | POA: Diagnosis not present

## 2016-08-19 DIAGNOSIS — I119 Hypertensive heart disease without heart failure: Secondary | ICD-10-CM

## 2016-08-19 DIAGNOSIS — D631 Anemia in chronic kidney disease: Secondary | ICD-10-CM | POA: Diagnosis present

## 2016-08-19 DIAGNOSIS — D649 Anemia, unspecified: Secondary | ICD-10-CM

## 2016-08-19 DIAGNOSIS — N186 End stage renal disease: Secondary | ICD-10-CM

## 2016-08-19 DIAGNOSIS — I248 Other forms of acute ischemic heart disease: Secondary | ICD-10-CM | POA: Diagnosis present

## 2016-08-19 DIAGNOSIS — R34 Anuria and oliguria: Secondary | ICD-10-CM | POA: Diagnosis present

## 2016-08-19 DIAGNOSIS — K922 Gastrointestinal hemorrhage, unspecified: Secondary | ICD-10-CM

## 2016-08-19 DIAGNOSIS — K5903 Drug induced constipation: Secondary | ICD-10-CM

## 2016-08-19 DIAGNOSIS — E1151 Type 2 diabetes mellitus with diabetic peripheral angiopathy without gangrene: Secondary | ICD-10-CM | POA: Diagnosis present

## 2016-08-19 DIAGNOSIS — D638 Anemia in other chronic diseases classified elsewhere: Secondary | ICD-10-CM

## 2016-08-19 DIAGNOSIS — E1122 Type 2 diabetes mellitus with diabetic chronic kidney disease: Secondary | ICD-10-CM | POA: Diagnosis present

## 2016-08-19 DIAGNOSIS — I132 Hypertensive heart and chronic kidney disease with heart failure and with stage 5 chronic kidney disease, or end stage renal disease: Secondary | ICD-10-CM | POA: Diagnosis present

## 2016-08-19 DIAGNOSIS — Z89619 Acquired absence of unspecified leg above knee: Secondary | ICD-10-CM

## 2016-08-19 DIAGNOSIS — I480 Paroxysmal atrial fibrillation: Secondary | ICD-10-CM | POA: Diagnosis present

## 2016-08-19 DIAGNOSIS — I2510 Atherosclerotic heart disease of native coronary artery without angina pectoris: Secondary | ICD-10-CM | POA: Diagnosis present

## 2016-08-19 DIAGNOSIS — R079 Chest pain, unspecified: Secondary | ICD-10-CM | POA: Diagnosis present

## 2016-08-19 DIAGNOSIS — K264 Chronic or unspecified duodenal ulcer with hemorrhage: Secondary | ICD-10-CM

## 2016-08-19 DIAGNOSIS — N189 Chronic kidney disease, unspecified: Secondary | ICD-10-CM

## 2016-08-19 DIAGNOSIS — Z992 Dependence on renal dialysis: Secondary | ICD-10-CM

## 2016-08-19 DIAGNOSIS — I4819 Other persistent atrial fibrillation: Secondary | ICD-10-CM

## 2016-08-19 DIAGNOSIS — I5043 Acute on chronic combined systolic (congestive) and diastolic (congestive) heart failure: Secondary | ICD-10-CM

## 2016-08-19 DIAGNOSIS — E440 Moderate protein-calorie malnutrition: Secondary | ICD-10-CM

## 2016-08-19 DIAGNOSIS — Z8673 Personal history of transient ischemic attack (TIA), and cerebral infarction without residual deficits: Secondary | ICD-10-CM

## 2016-08-19 LAB — HEMOGLOBIN AND HEMATOCRIT, BLOOD
HCT: 29.4 % — ABNORMAL LOW (ref 39.0–52.0)
HCT: 27.4 % — ABNORMAL LOW (ref 39.0–52.0)
Hemoglobin: 9.6 g/dL — ABNORMAL LOW (ref 13.0–17.0)
Hemoglobin: 8.9 g/dL — ABNORMAL LOW (ref 13.0–17.0)

## 2016-08-19 LAB — CBC WITH DIFFERENTIAL/PLATELET
Basophils Absolute: 0 10*3/uL (ref 0.0–0.1)
Basophils Relative: 0 %
EOS PCT: 3 %
Eosinophils Absolute: 0.2 10*3/uL (ref 0.0–0.7)
HEMATOCRIT: 24.3 % — AB (ref 39.0–52.0)
Hemoglobin: 7.5 g/dL — ABNORMAL LOW (ref 13.0–17.0)
LYMPHS PCT: 17 %
Lymphs Abs: 1.2 10*3/uL (ref 0.7–4.0)
MCH: 31 pg (ref 26.0–34.0)
MCHC: 30.9 g/dL (ref 30.0–36.0)
MCV: 100.4 fL — AB (ref 78.0–100.0)
MONO ABS: 0.5 10*3/uL (ref 0.1–1.0)
MONOS PCT: 7 %
Neutro Abs: 4.9 10*3/uL (ref 1.7–7.7)
Neutrophils Relative %: 73 %
Platelets: 199 10*3/uL (ref 150–400)
RBC: 2.42 MIL/uL — ABNORMAL LOW (ref 4.22–5.81)
RDW: 20 % — AB (ref 11.5–15.5)
WBC: 6.8 10*3/uL (ref 4.0–10.5)

## 2016-08-19 LAB — BASIC METABOLIC PANEL
ANION GAP: 17 — AB (ref 5–15)
BUN: 124 mg/dL — AB (ref 6–20)
CALCIUM: 8.9 mg/dL (ref 8.9–10.3)
CO2: 23 mmol/L (ref 22–32)
Chloride: 95 mmol/L — ABNORMAL LOW (ref 101–111)
Creatinine, Ser: 9.32 mg/dL — ABNORMAL HIGH (ref 0.61–1.24)
GFR calc Af Amer: 5 mL/min — ABNORMAL LOW (ref >60)
GFR calc non Af Amer: 5 mL/min — ABNORMAL LOW (ref >60)
GLUCOSE: 112 mg/dL — AB (ref 65–99)
POTASSIUM: 5.5 mmol/L — AB (ref 3.5–5.1)
Sodium: 135 mmol/L (ref 135–145)

## 2016-08-19 LAB — GLUCOSE, CAPILLARY: GLUCOSE-CAPILLARY: 84 mg/dL (ref 65–99)

## 2016-08-19 LAB — TROPONIN I
TROPONIN I: 0.06 ng/mL — AB (ref <0.03)
TROPONIN I: 0.07 ng/mL — AB (ref <0.03)
Troponin I: 0.13 ng/mL (ref <0.03)

## 2016-08-19 LAB — MRSA PCR SCREENING: MRSA BY PCR: NEGATIVE

## 2016-08-19 LAB — PREPARE RBC (CROSSMATCH)

## 2016-08-19 LAB — POC OCCULT BLOOD, ED: FECAL OCCULT BLD: POSITIVE — AB

## 2016-08-19 LAB — PROTIME-INR
INR: 1.12
Prothrombin Time: 14.5 seconds (ref 11.4–15.2)

## 2016-08-19 LAB — VITAMIN B12: VITAMIN B 12: 348 pg/mL (ref 180–914)

## 2016-08-19 MED ORDER — SEVELAMER CARBONATE 800 MG PO TABS
1600.0000 mg | ORAL_TABLET | ORAL | Status: DC
  Administered 2016-08-19 – 2016-08-20 (×2): 1600 mg via ORAL
  Filled 2016-08-19: qty 2

## 2016-08-19 MED ORDER — CARVEDILOL 3.125 MG PO TABS
3.1250 mg | ORAL_TABLET | Freq: Two times a day (BID) | ORAL | Status: DC
  Administered 2016-08-19 – 2016-08-21 (×5): 3.125 mg via ORAL
  Filled 2016-08-19 (×5): qty 1

## 2016-08-19 MED ORDER — LIDOCAINE HCL (PF) 1 % IJ SOLN: 5.0000 mL | INTRAMUSCULAR | Status: DC | PRN

## 2016-08-19 MED ORDER — SEVELAMER CARBONATE 800 MG PO TABS
2400.0000 mg | ORAL_TABLET | Freq: Three times a day (TID) | ORAL | Status: DC
  Administered 2016-08-19 – 2016-08-21 (×6): 2400 mg via ORAL
  Filled 2016-08-19 (×5): qty 3

## 2016-08-19 MED ORDER — CALCITRIOL 0.25 MCG PO CAPS
0.5000 ug | ORAL_CAPSULE | Freq: Every day | ORAL | Status: DC
  Administered 2016-08-19 – 2016-08-21 (×3): 0.5 ug via ORAL
  Filled 2016-08-19: qty 2
  Filled 2016-08-19 (×2): qty 1
  Filled 2016-08-19: qty 2
  Filled 2016-08-19: qty 1

## 2016-08-19 MED ORDER — SODIUM CHLORIDE 0.9 % IV SOLN: 250.0000 mL | INTRAVENOUS | Status: DC | PRN

## 2016-08-19 MED ORDER — GABAPENTIN 100 MG PO CAPS
100.0000 mg | ORAL_CAPSULE | Freq: Every day | ORAL | Status: DC
  Administered 2016-08-19 – 2016-08-20 (×2): 100 mg via ORAL
  Filled 2016-08-19 (×2): qty 1

## 2016-08-19 MED ORDER — ALTEPLASE 2 MG IJ SOLR
2.0000 mg | Freq: Once | INTRAMUSCULAR | Status: DC | PRN
  Filled 2016-08-19: qty 2

## 2016-08-19 MED ORDER — ACETAMINOPHEN 325 MG PO TABS
650.0000 mg | ORAL_TABLET | Freq: Four times a day (QID) | ORAL | Status: DC | PRN
  Administered 2016-08-21: 650 mg via ORAL
  Filled 2016-08-19: qty 2

## 2016-08-19 MED ORDER — ALUM & MAG HYDROXIDE-SIMETH 200-200-20 MG/5ML PO SUSP
30.0000 mL | ORAL | Status: DC | PRN
Start: 2016-08-19 — End: 2016-08-21
  Administered 2016-08-19: 30 mL via ORAL
  Filled 2016-08-19: qty 30

## 2016-08-19 MED ORDER — ACETAMINOPHEN 650 MG RE SUPP: 650.0000 mg | Freq: Four times a day (QID) | RECTAL | Status: DC | PRN

## 2016-08-19 MED ORDER — HYDROCODONE-ACETAMINOPHEN 5-325 MG PO TABS
1.0000 | ORAL_TABLET | Freq: Two times a day (BID) | ORAL | Status: DC
  Administered 2016-08-20 – 2016-08-21 (×3): 2 via ORAL
  Filled 2016-08-19: qty 1
  Filled 2016-08-19 (×3): qty 2

## 2016-08-19 MED ORDER — POLYVINYL ALCOHOL 1.4 % OP SOLN
1.0000 [drp] | OPHTHALMIC | Status: DC | PRN
  Administered 2016-08-19: 1 [drp] via OPHTHALMIC
  Filled 2016-08-19: qty 15

## 2016-08-19 MED ORDER — PENTAFLUOROPROP-TETRAFLUOROETH EX AERO
1.0000 "application " | INHALATION_SPRAY | CUTANEOUS | Status: DC | PRN
  Filled 2016-08-19: qty 30

## 2016-08-19 MED ORDER — SODIUM CHLORIDE 0.9% FLUSH
3.0000 mL | Freq: Two times a day (BID) | INTRAVENOUS | Status: DC
  Administered 2016-08-19 – 2016-08-20 (×2): 3 mL via INTRAVENOUS

## 2016-08-19 MED ORDER — PRO-STAT SUGAR FREE PO LIQD
30.0000 mL | Freq: Two times a day (BID) | ORAL | Status: DC
  Administered 2016-08-19 – 2016-08-21 (×4): 30 mL via ORAL
  Filled 2016-08-19 (×4): qty 30

## 2016-08-19 MED ORDER — ONDANSETRON HCL 4 MG PO TABS: 4.0000 mg | ORAL_TABLET | Freq: Four times a day (QID) | ORAL | Status: DC | PRN

## 2016-08-19 MED ORDER — LIDOCAINE-PRILOCAINE 2.5-2.5 % EX CREA
1.0000 "application " | TOPICAL_CREAM | CUTANEOUS | Status: DC | PRN
  Filled 2016-08-19: qty 5

## 2016-08-19 MED ORDER — SODIUM CHLORIDE 0.9% FLUSH
3.0000 mL | Freq: Two times a day (BID) | INTRAVENOUS | Status: DC
  Administered 2016-08-21: 3 mL via INTRAVENOUS

## 2016-08-19 MED ORDER — ATORVASTATIN CALCIUM 40 MG PO TABS
80.0000 mg | ORAL_TABLET | Freq: Every day | ORAL | Status: DC
  Administered 2016-08-19 – 2016-08-21 (×3): 80 mg via ORAL
  Filled 2016-08-19: qty 1
  Filled 2016-08-19 (×3): qty 2

## 2016-08-19 MED ORDER — ISOSORBIDE MONONITRATE ER 30 MG PO TB24
15.0000 mg | ORAL_TABLET | Freq: Every day | ORAL | Status: DC
  Administered 2016-08-19 – 2016-08-20 (×2): 15 mg via ORAL
  Filled 2016-08-19 (×4): qty 1

## 2016-08-19 MED ORDER — SODIUM CHLORIDE 0.9% FLUSH: 3.0000 mL | INTRAVENOUS | Status: DC | PRN

## 2016-08-19 MED ORDER — BOOST / RESOURCE BREEZE PO LIQD
1.0000 | Freq: Three times a day (TID) | ORAL | Status: DC
  Administered 2016-08-19 – 2016-08-21 (×6): 1 via ORAL

## 2016-08-19 MED ORDER — AMLODIPINE BESYLATE 5 MG PO TABS
5.0000 mg | ORAL_TABLET | Freq: Every day | ORAL | Status: DC
  Administered 2016-08-19 – 2016-08-21 (×3): 5 mg via ORAL
  Filled 2016-08-19 (×3): qty 1

## 2016-08-19 MED ORDER — HEPARIN SODIUM (PORCINE) 1000 UNIT/ML DIALYSIS
1000.0000 [IU] | INTRAMUSCULAR | Status: DC | PRN
  Filled 2016-08-19: qty 1

## 2016-08-19 MED ORDER — SODIUM CHLORIDE 0.9 % IV SOLN: 100.0000 mL | INTRAVENOUS | Status: DC | PRN

## 2016-08-19 MED ORDER — SEVELAMER CARBONATE 800 MG PO TABS: 1600.0000 mg | ORAL_TABLET | ORAL | Status: DC

## 2016-08-19 MED ORDER — PANTOPRAZOLE SODIUM 40 MG IV SOLR
40.0000 mg | Freq: Two times a day (BID) | INTRAVENOUS | Status: DC
Start: 2016-08-19 — End: 2016-08-19
  Administered 2016-08-19: 40 mg via INTRAVENOUS
  Filled 2016-08-19 (×2): qty 40

## 2016-08-19 MED ORDER — LATANOPROST 0.005 % OP SOLN
1.0000 [drp] | Freq: Every day | OPHTHALMIC | Status: DC
  Administered 2016-08-19 – 2016-08-20 (×2): 1 [drp] via OPHTHALMIC
  Filled 2016-08-19: qty 2.5

## 2016-08-19 MED ORDER — ONDANSETRON HCL 4 MG/2ML IJ SOLN: 4.0000 mg | Freq: Four times a day (QID) | INTRAMUSCULAR | Status: DC | PRN

## 2016-08-19 MED ORDER — SODIUM CHLORIDE 0.9 % IV SOLN
10.0000 mL/h | Freq: Once | INTRAVENOUS | Status: AC
  Administered 2016-08-19: 10 mL/h via INTRAVENOUS

## 2016-08-19 MED ORDER — NITROGLYCERIN 0.4 MG SL SUBL: 0.4000 mg | SUBLINGUAL_TABLET | SUBLINGUAL | Status: DC | PRN

## 2016-08-19 MED ORDER — PANTOPRAZOLE SODIUM 40 MG PO TBEC
40.0000 mg | DELAYED_RELEASE_TABLET | Freq: Every day | ORAL | Status: DC
  Administered 2016-08-20 – 2016-08-21 (×2): 40 mg via ORAL
  Filled 2016-08-19 (×2): qty 1

## 2016-08-19 MED ORDER — EPOETIN ALFA 10000 UNIT/ML IJ SOLN
10000.0000 [IU] | INTRAMUSCULAR | Status: DC
  Administered 2016-08-19 – 2016-08-21 (×2): 10000 [IU] via INTRAVENOUS
  Filled 2016-08-19 (×3): qty 1

## 2016-08-19 MED ORDER — RENA-VITE PO TABS
1.0000 | ORAL_TABLET | ORAL | Status: DC
  Administered 2016-08-19: 1 via ORAL
  Filled 2016-08-19 (×2): qty 1

## 2016-08-19 NOTE — Consult Note (Signed)
Reason for Consult: End-stage renal disease Referring Physician: Dr. [name] is an 82 y.o. male.  HPI: He is a patient who has history of end-stage renal disease, coronary artery disease, nonischemic cardiomyopathy, presently came with complaints of some chest pain, any blood in the stool. When patient was evaluated and was found to have severe anemia and admitted to the hospital. Presently patient denies any nausea or vomiting. He also complains of weakness when he came. Still he feels somewhat weak. Presently patient is getting blood transfusion. Patient with previous history of GI bleeding.  Past Medical History:  Diagnosis Date  . Anemia of chronic disease   . Arthritis   . CAD (coronary artery disease)    Moderate multivessel disease 07/2015 - managed medically  . ESRD on hemodialysis [facility])    M/W/F in [place] - Dr. Florene Glen  . Essential hypertension   . Gout   . History of blood transfusion   . History of myopericarditis 2015   07/2015  . History of pneumonia 2014  . History of stroke   . Hypercholesterolemia   . Nonischemic cardiomyopathy (HCC)    LVEF 35-40%  . Peripheral vascular disease ([place])    Status post right below knee amputation for a nonhealing wound of the right foot 12/2014  . Pneumonia     Past Surgical History:  Procedure Laterality Date  . ABOVE KNEE LEG AMPUTATION Left 10/10/2015  . AMPUTATION Right 01/05/2015   BKA  . AMPUTATION Left 10/10/2015   Procedure: AMPUTATION ABOVE KNEE LEFT;  Surgeon: Angelia Mould, MD;  Location: [place];  Service: Vascular;  Laterality: Left;  . AV FISTULA PLACEMENT  08/24/2012   Procedure: ARTERIOVENOUS (AV) FISTULA CREATION;  Surgeon: Rosetta Posner, MD;  Location: [place];  Service: Vascular;  Laterality: Left;  . BACK SURGERY    . CAPD REMOVAL N/A 03/13/2015   Procedure: CONTINUOUS AMBULATORY PERITONEAL DIALYSIS  (CAPD) CATHETER REMOVAL;  Surgeon: Coralie Keens, MD;  Location: [place];  Service: General;   Laterality: N/A;  . CARDIAC CATHETERIZATION    . CHOLECYSTECTOMY    . COLONOSCOPY     remote past by Dr. Tamala Julian in [place], [place]   . COLONOSCOPY Left 09/26/2014   Dr. Paulita Fujita: old and semi-fresh blood scattered throughout the colon, several medium-sized diverticula, no obvious focal bleeding site, no blood in TI, several polyps seen in cecum, ascending colon and proximal transverse s/p biopsy (tubular adenomas)  . COLONOSCOPY N/A 03/05/2016   Procedure: COLONOSCOPY;  Surgeon: Danie Binder, MD;  [facility];  Service: Endoscopy;  Laterality: N/A;  . ESOPHAGOGASTRODUODENOSCOPY N/A 03/05/2016   Procedure: ESOPHAGOGASTRODUODENOSCOPY (EGD);  Surgeon: Danie Binder, MD;  [facility];  Service: Endoscopy;  Laterality: N/A;  . GANGLION CYST EXCISION  01/03/2012   Procedure: REMOVAL GANGLION OF WRIST;  Surgeon: Scherry Ran, MD;  [facility];  Service: General;  Laterality: Right;  . GIVENS CAPSULE STUDY N/A 03/07/2016   Procedure: GIVENS CAPSULE STUDY;  Surgeon: Clarene Essex, MD;  Location: [facility] ENDOSCOPY;  Service: Endoscopy;  Laterality: N/A;  . HEMORRHOID SURGERY  1970's  . INSERTION OF DIALYSIS CATHETER Right     neck  . INSERTION OF DIALYSIS CATHETER  10/19/2012   Procedure: INSERTION OF DIALYSIS CATHETER;  Surgeon: Rosetta Posner, MD;  Location: [place];  Service: Vascular;  Laterality: N/A;  [place] ARTHROSCOPY Right 2007  . LEFT HEART CATHETERIZATION WITH CORONARY ANGIOGRAM N/A 08/17/2014  Procedure: LEFT HEART CATHETERIZATION WITH CORONARY ANGIOGRAM;  Surgeon: Larey Dresser, MD;  Location: [facility] CATH LAB;  Service: Cardiovascular;  Laterality: N/A;  . LUMBAR [place] SURGERY  2004    Family History  Problem Relation Age of Onset  . Stroke Mother   . Arthritis    . Cancer    . Kidney disease    . Cancer Sister   . Colon cancer Brother     unclear   . Colon cancer Brother   . Anesthesia problems Neg Hx   . Hypotension Neg Hx   . Malignant  hyperthermia Neg Hx   . Pseudochol deficiency Neg Hx     Social History:  reports that he quit smoking about 18 years ago. His smoking use included Cigarettes. He has a 30.00 pack-year smoking history. He quit smokeless tobacco use about 22 years ago. His smokeless tobacco use included Chew. He reports that he does not drink alcohol or use drugs.  Allergies: No Known Allergies  Medications: I have reviewed the patient's current medications.  Results for orders placed or performed during the hospital encounter of 08/19/16 (from the past 48 hour(s))  Basic metabolic panel     Status: Abnormal   Collection Time: 08/19/16  3:19 AM  Result Value Ref Range   Sodium 135 135 - 145 mmol/L   Potassium 5.5 (H) 3.5 - 5.1 mmol/L   Chloride 95 (L) 101 - 111 mmol/L   CO2 23 22 - 32 mmol/L   Glucose, Bld 112 (H) 65 - 99 mg/dL   BUN 124 (H) 6 - 20 mg/dL    Comment: RESULTS CONFIRMED BY MANUAL DILUTION   Creatinine, Ser 9.32 (H) 0.61 - 1.24 mg/dL   Calcium 8.9 8.9 - 10.3 mg/dL   GFR calc non Af Amer 5 (L) >60 mL/min   GFR calc Af Amer 5 (L) >60 mL/min    Comment: (NOTE) The eGFR has been calculated using the CKD EPI equation. This calculation has not been validated in all clinical situations. eGFR's persistently <60 mL/min signify possible Chronic Kidney Disease.    Anion gap 17 (H) 5 - 15  CBC with Differential/Platelet     Status: Abnormal   Collection Time: 08/19/16  3:19 AM  Result Value Ref Range   WBC 6.8 4.0 - 10.5 K/uL   RBC 2.42 (L) 4.22 - 5.81 MIL/uL   Hemoglobin 7.5 (L) 13.0 - 17.0 g/dL   HCT 24.3 (L) 39.0 - 52.0 %   MCV 100.4 (H) 78.0 - 100.0 fL   MCH 31.0 26.0 - 34.0 pg   MCHC 30.9 30.0 - 36.0 g/dL   RDW 20.0 (H) 11.5 - 15.5 %   Platelets 199 150 - 400 K/uL   Neutrophils Relative % 73 %   Neutro Abs 4.9 1.7 - 7.7 K/uL   Lymphocytes Relative 17 %   Lymphs Abs 1.2 0.7 - 4.0 K/uL   Monocytes Relative 7 %   Monocytes Absolute 0.5 0.1 - 1.0 K/uL   Eosinophils Relative 3 %    Eosinophils Absolute 0.2 0.0 - 0.7 K/uL   Basophils Relative 0 %   Basophils Absolute 0.0 0.0 - 0.1 K/uL  Protime-INR     Status: None   Collection Time: 08/19/16  3:19 AM  Result Value Ref Range   Prothrombin Time 14.5 11.4 - 15.2 seconds   INR 1.12   Type and screen     Status: None (Preliminary result)   Collection Time: 08/19/16  3:19 AM  Result  Value Ref Range   ABO/RH(D) O POS    Antibody Screen NEG    Sample Expiration 08/22/2016    Unit Number [id number]    Blood Component Type RED CELLS,LR    Unit division 00    Status of Unit ISSUED    Transfusion Status OK TO TRANSFUSE    Crossmatch Result Compatible    Unit Number [id number]    Blood Component Type RED CELLS,LR    Unit division 00    Status of Unit ALLOCATED    Transfusion Status OK TO TRANSFUSE    Crossmatch Result Compatible   Prepare RBC     Status: None   Collection Time: 08/19/16  3:19 AM  Result Value Ref Range   Order Confirmation ORDER PROCESSED BY BLOOD BANK   Troponin I     Status: Abnormal   Collection Time: 08/19/16  3:19 AM  Result Value Ref Range   Troponin I 0.06 (HH) <0.03 ng/mL    Comment: CRITICAL RESULT CALLED TO, READ BACK BY AND VERIFIED WITH: WAGONER,R AT 6:05AM ON 08/19/16 BY FESTERMAN,C   POC occult blood, ED     Status: Abnormal   Collection Time: 08/19/16  4:07 AM  Result Value Ref Range   Fecal Occult Bld POSITIVE (A) NEGATIVE  Glucose, capillary     Status: None   Collection Time: 08/19/16  7:27 AM  Result Value Ref Range   Glucose-Capillary 84 65 - 99 mg/dL   Comment 1 Notify RN    Comment 2 Document in Chart     No results found.  Review of Systems  Constitutional: Positive for malaise/fatigue. Negative for chills and fever.  Respiratory: Negative for cough and shortness of breath.   Cardiovascular: Positive for chest pain. Negative for orthopnea and claudication.  Gastrointestinal: Positive for blood in stool. Negative for nausea and vomiting.  Neurological:  Positive for weakness.   Blood pressure 133/64, pulse 76, temperature 98.7 F (37.1 C), temperature source Oral, resp. rate 20, weight 61.2 kg (135 lb), SpO2 99 %. Physical Exam  Constitutional: He is oriented to person, place, and time. No distress.  HENT:  Mouth/Throat: No oropharyngeal exudate.  Eyes: No scleral icterus.  Neck: JVD present.  Cardiovascular: Normal rate and regular rhythm.   Murmur heard. Respiratory: He has no wheezes.  GI: He exhibits no distension. There is no tenderness.  Musculoskeletal: He exhibits no edema.  Neurological: He is alert and oriented to person, place, and time.    Assessment/Plan: Problem #1 GI bleeding: Presently his hemoglobin is low and patient is receiving blood transfusion. Problem #2 end-stage renal disease: Status post hemodialysis on Friday. Presently he doesn't have any nausea or vomiting. His potassium is high Problem #3 history of hypertension: His blood pressure is reasonably controlled Problem #4 history of peripheral vascular disease: Status post left BKA and right AKA. Problem #5 metabolic bone disease: Calcium is range Problem #6 fluid management: Patient doesn't have any significant sign of fluid overload. Problem #7 previous history of myocarditis Plan: 1]We'll make arrangements for patient to get dialysis today           2]We'll dialyze him for 31/2  hours and remove 51/8 L if systolic pressures above 90           3]We'll hold heparin.  Yamilex Borgwardt S 08/19/2016, 8:13 AM

## 2016-08-19 NOTE — Progress Notes (Signed)
CRITICAL VALUE ALERT  Critical value received:  Trop 0.07  Date of notification:  08/19/2016  Time of notification:  1150  Critical value read back:Yes.    Nurse who received alert:  Purcell Nails  MD notified (1st page):  DonDiego  Time of first page:  1156  MD notified (2nd page):  Time of second page:  Responding MD:  Cindie Laroche  Time MD responded:  [phone]

## 2016-08-19 NOTE — Consult Note (Signed)
Referring Provider: Dr. Myna Hidalgo  Primary Care Physician:  [name], MD Primary Gastroenterologist:  [facility] in 2016, followed by [facility] during hospitalization June 2017 and current hospitalization   Date of Admission: 08/19/16 Date of Consultation: 08/19/16  Reason for Consultation:  GI bleed   HPI:  Randy [name] is an 82 year old male with multiple comorbidities to include CAD, ESRD on dialysis, anemia of chronic disease, chronic heart failure, lower GI bleed likely of diverticula origin in Jan 2016 and most recently in June 2017, presenting with recurrent GI bleed. When last seen in June 2017, he underwent a colonoscopy by Dr. Oneida Alar noting  old blood in ileum, few fresh clots ascending colon, frequent large mouth diverticula and old clots in descending/sigmoid. 8 polyps in ascending colon not manipulated. No active bleeding noted on EGD/TCS. EGD with hiatal hernia and gastritis. He was transferred to [facility] due to need for possible angiography, surgical intervention. While a [facility] in June 2017, givens capsule completed with dark material in ileum, and fresh blood was noted in right colon. Tagged RBC scan on March 06, 2016 without obvious GI bleed source.   States he woke up around 1-2 am and felt the need to have a BM. Went to bathroom and noted large amount of dark blood with clots. No further bleeding since that time. Not actively bleeding at time of consultation. States he has no abdominal pain, N/V. Felt chest discomfort that he feels was "heartburn" during bleeding episode, now resolved. Feels weak, light-headed. Denies NSAIDs. No longer on 81 mg aspirin. No anticoagulation. Admitting Hgb 7.5, down 1 gram from 5 months ago. Appears his baseline Hgb is 8/9 range. 2 units PRBCs have been ordered. 1 unit completed at 0800 this morning. Next unit about to be given at time of consultation. Troponin slightly elevated at 0.06. Dialysis scheduled for today: hyperkalemia noted with potassium 5.5,  BUN 124, Cr 9.32.   Past Medical History:  Diagnosis Date  . Anemia of chronic disease   . Arthritis   . CAD (coronary artery disease)    Moderate multivessel disease 07/2015 - managed medically  . ESRD on hemodialysis [facility])    M/W/F in [place] - Dr. Florene Glen  . Essential hypertension   . Gout   . History of blood transfusion   . History of myopericarditis 2015   07/2015  . History of pneumonia 2014  . History of stroke   . Hypercholesterolemia   . Nonischemic cardiomyopathy (HCC)    LVEF 35-40%  . Peripheral vascular disease ([place])    Status post right below knee amputation for a nonhealing wound of the right foot 12/2014  . Pneumonia     Past Surgical History:  Procedure Laterality Date  . ABOVE KNEE LEG AMPUTATION Left 10/10/2015  . AMPUTATION Right 01/05/2015   BKA  . AMPUTATION Left 10/10/2015   Procedure: AMPUTATION ABOVE KNEE LEFT;  Surgeon: Angelia Mould, MD;  Location: [place];  Service: Vascular;  Laterality: Left;  . AV FISTULA PLACEMENT  08/24/2012   Procedure: ARTERIOVENOUS (AV) FISTULA CREATION;  Surgeon: Rosetta Posner, MD;  Location: [place];  Service: Vascular;  Laterality: Left;  . BACK SURGERY    . CAPD REMOVAL N/A 03/13/2015   Procedure: CONTINUOUS AMBULATORY PERITONEAL DIALYSIS  (CAPD) CATHETER REMOVAL;  Surgeon: Coralie Keens, MD;  Location: [place];  Service: General;  Laterality: N/A;  . CARDIAC CATHETERIZATION    . CHOLECYSTECTOMY    . COLONOSCOPY     remote past by  Dr. Tamala Julian in [place], [place]   . COLONOSCOPY Left 09/26/2014   Dr. Paulita Fujita: old and semi-fresh blood scattered throughout the colon, several medium-sized diverticula, no obvious focal bleeding site, no blood in TI, several polyps seen in cecum, ascending colon and proximal transverse s/p biopsy (tubular adenomas)  . COLONOSCOPY N/A 03/05/2016   Dr. Oneida Alar: old blood in ileum, few fresh clots ascending colon, frequent large mouth diverticula and old clots in descending/sigmoid. 8 polyps in  ascending colon not manipulated  . ESOPHAGOGASTRODUODENOSCOPY N/A 03/05/2016   Dr. Oneida Alar: hiatal hernia, mild gastritis   . GANGLION CYST EXCISION  01/03/2012   Procedure: REMOVAL GANGLION OF WRIST;  Surgeon: Scherry Ran, MD;  Location: [facility];  Service: General;  Laterality: Right;  . GIVENS CAPSULE STUDY N/A 03/07/2016   Slope: dark material in ileum, with fresh blood in right colon   . HEMORRHOID SURGERY  1970's  . INSERTION OF DIALYSIS CATHETER Right     neck  . INSERTION OF DIALYSIS CATHETER  10/19/2012   Procedure: INSERTION OF DIALYSIS CATHETER;  Surgeon: Rosetta Posner, MD;  Location: [place];  Service: Vascular;  Laterality: N/A;  [place] ARTHROSCOPY Right 2007  . LEFT HEART CATHETERIZATION WITH CORONARY ANGIOGRAM N/A 08/17/2014   Procedure: LEFT HEART CATHETERIZATION WITH CORONARY ANGIOGRAM;  Surgeon: Larey Dresser, MD;  Location[facility];  Service: Cardiovascular;  Laterality: N/A;  . LUMBAR [place] SURGERY  2004    Prior to Admission medications   Medication Sig Start Date End Date Taking? Authorizing Provider  Amino Acids-Protein Hydrolys (FEEDING SUPPLEMENT, PRO-STAT SUGAR FREE 64,) LIQD Take 30 mLs by mouth 2 (two) times daily. 03/14/16  Yes Lavina Hamman, MD  amLODipine (NORVASC) 10 MG tablet Take 0.5 tablets (5 mg total) by mouth daily. 10/31/15  Yes Ivan Anchors Love, PA-C  atorvastatin (LIPITOR) 80 MG tablet Take 1 tablet (80 mg total) by mouth daily. 11/02/15  Yes Arnoldo Lenis, MD  calcitRIOL (ROCALTROL) 0.5 MCG capsule Take 1 capsule (0.5 mcg total) by mouth daily. 11/10/15  Yes Lucia Gaskins, MD  carvedilol (COREG) 3.125 MG tablet Take 1 tablet (3.125 mg total) by mouth 2 (two) times daily with a meal. 03/14/16  Yes Lavina Hamman, MD  docusate sodium (COLACE) 100 MG capsule Take 2 capsules (200 mg total) by mouth daily. Patient taking differently: Take 200 mg by mouth daily as needed for mild constipation or moderate constipation.  10/31/15   Yes Ivan Anchors Love, PA-C  feeding supplement (BOOST / RESOURCE BREEZE) LIQD Take 1 Container by mouth 4 (four) times daily -  with meals and at bedtime. 03/14/16  Yes Lavina Hamman, MD  gabapentin (NEURONTIN) 100 MG capsule Take 1 capsule (100 mg total) by mouth at bedtime. 10/23/15  Yes Barton Dubois, MD  HYDROcodone-acetaminophen (NORCO/VICODIN) 5-325 MG tablet Take 1-2 tablets by mouth 2 (two) times daily. 05/04/16  Yes Fredia Sorrow, MD  isosorbide mononitrate (IMDUR) 30 MG 24 hr tablet TAKE 1/2 TABLET BY MOUTH AT BEDTIME. 07/10/16  Yes Arnoldo Lenis, MD  lidocaine-prilocaine (EMLA) cream Apply 1 application topically every Monday, Wednesday, and Friday. For dialysis 10/06/15  Yes Historical Provider, MD  LUMIGAN 0.01 % SOLN Place 1 drop into both eyes at bedtime. 09/18/15  Yes Historical Provider, MD  multivitamin (RENA-VIT) TABS tablet Take 1 tablet by mouth every Monday, Wednesday, and Friday. Takes on dialysis days   Yes Historical Provider, MD  nitroGLYCERIN (NITROSTAT) 0.4 MG SL tablet  Place 1 tablet (0.4 mg total) under the tongue every 5 (five) minutes as needed for chest pain. 12/20/15  Yes Ripudeep Krystal Eaton, MD  pantoprazole (PROTONIX) 40 MG tablet Take 1 tablet (40 mg total) by mouth daily at 12 noon. 10/23/15  Yes Barton Dubois, MD  polyethylene glycol [facility] / GLYCOLAX) packet Take 17 g by mouth 2 (two) times daily. 03/14/16  Yes Lavina Hamman, MD  polyvinyl alcohol (LIQUIFILM TEARS) 1.4 % ophthalmic solution Place 1 drop into both eyes as needed for dry eyes. 10/31/15  Yes Ivan Anchors Love, PA-C  sevelamer carbonate (RENVELA) 800 MG tablet Take 1,600-2,400 mg by mouth See admin instructions. Take 3 tablets (2400 mg) with meals and 2 tablets (1600 mg) with snacks   Yes Historical Provider, MD  SIMBRINZA 1-0.2 % SUSP Place 1 drop into both eyes 2 (two) times daily. 09/18/15  Yes Historical Provider, MD    Current Facility-Administered Medications  Medication Dose Route Frequency Provider  Last Rate Last Dose  . 0.9 %  sodium chloride infusion  250 mL Intravenous PRN Vianne Bulls, MD      . acetaminophen (TYLENOL) tablet 650 mg  650 mg Oral Q6H PRN Vianne Bulls, MD       Or  . acetaminophen (TYLENOL) suppository 650 mg  650 mg Rectal Q6H PRN Vianne Bulls, MD      . alum & mag hydroxide-simeth (MAALOX/MYLANTA) 200-200-20 MG/5ML suspension 30 mL  30 mL Oral Q4H PRN Vianne Bulls, MD   30 mL at 08/19/16 [id number]  . amLODipine (NORVASC) tablet 5 mg  5 mg Oral Daily Ilene Qua Opyd, MD      . atorvastatin (LIPITOR) tablet 80 mg  80 mg Oral Daily Ilene Qua Opyd, MD      . calcitRIOL (ROCALTROL) capsule 0.5 mcg  0.5 mcg Oral Daily Ilene Qua Opyd, MD      . carvedilol (COREG) tablet 3.125 mg  3.125 mg Oral BID WC Vianne Bulls, MD   3.125 mg at 08/19/16 [id number]  . epoetin alfa (EPOGEN,PROCRIT) injection 10,000 Units  10,000 Units Intravenous Q M,W,F-HD Fran Lowes, MD      . feeding supplement (BOOST / RESOURCE BREEZE) liquid 1 Container  1 Container Oral TID WC & HS Timothy S Opyd, MD      . feeding supplement (PRO-STAT SUGAR FREE 64) liquid 30 mL  30 mL Oral BID Ilene Qua Opyd, MD      . gabapentin (NEURONTIN) capsule 100 mg  100 mg Oral QHS Ilene Qua Opyd, MD      . HYDROcodone-acetaminophen (NORCO/VICODIN) 5-325 MG per tablet 1-2 tablet  1-2 tablet Oral BID Vianne Bulls, MD      . isosorbide mononitrate (IMDUR) 24 hr tablet 15 mg  15 mg Oral QHS Timothy S Opyd, MD      . latanoprost (XALATAN) 0.005 % ophthalmic solution 1 drop  1 drop Both Eyes QHS Timothy S Opyd, MD      . multivitamin (RENA-VIT) tablet 1 tablet  1 tablet Oral Q M,W,F Timothy S Opyd, MD      . nitroGLYCERIN (NITROSTAT) SL tablet 0.4 mg  0.4 mg Sublingual Q5 min PRN Ilene Qua Opyd, MD      . ondansetron (ZOFRAN) tablet 4 mg  4 mg Oral Q6H PRN Vianne Bulls, MD       Or  . ondansetron (ZOFRAN) injection 4 mg  4 mg Intravenous Q6H PRN Vianne Bulls, MD      .  pantoprazole (PROTONIX) injection 40 mg  40 mg  Intravenous Q12H Vianne Bulls, MD   40 mg at 08/19/16 0521  . polyvinyl alcohol (LIQUIFILM TEARS) 1.4 % ophthalmic solution 1 drop  1 drop Both Eyes PRN Ilene Qua Opyd, MD      . sevelamer carbonate (RENVELA) tablet 1,600 mg  1,600 mg Oral With snacks Ilene Qua Opyd, MD      . sevelamer carbonate (RENVELA) tablet 2,400 mg  2,400 mg Oral TID WC Timothy S Opyd, MD      . sodium chloride flush (NS) 0.9 % injection 3 mL  3 mL Intravenous Q12H Timothy S Opyd, MD      . sodium chloride flush (NS) 0.9 % injection 3 mL  3 mL Intravenous Q12H Timothy S Opyd, MD      . sodium chloride flush (NS) 0.9 % injection 3 mL  3 mL Intravenous PRN Vianne Bulls, MD        Allergies as of 08/19/2016  . (No Known Allergies)    Family History  Problem Relation Age of Onset  . Stroke Mother   . Arthritis    . Cancer    . Kidney disease    . Cancer Sister   . Colon cancer Brother     unclear   . Colon cancer Brother   . Anesthesia problems Neg Hx   . Hypotension Neg Hx   . Malignant hyperthermia Neg Hx   . Pseudochol deficiency Neg Hx     Social History   Social History  . Marital status: Married    Spouse name: N/A  . Number of children: N/A  . Years of education: 10   Occupational History  .  Retired   Social History Main Topics  . Smoking status: Former Smoker    Packs/day: 1.00    Years: 30.00    Types: Cigarettes    Quit date: 08/19/1998  . Smokeless tobacco: Former Systems developer    Types: Chew    Quit date: 12/31/1993  . Alcohol use No  . Drug use: No  . Sexual activity: No   Other Topics Concern  . Not on file   Social History Narrative  . No narrative on file    Review of Systems: As noted in HPI   Physical Exam: Vital signs in last 24 hours: Temp:  [97.6 F (36.4 C)-98.7 F (37.1 C)] 98 F (36.7 C) (11/27 0830) Pulse Rate:  [74-80] 80 (11/27 0812) Resp:  [18-23] 20 (11/27 0459) BP: (107-139)/(55-69) 107/55 (11/27 0812) SpO2:  [88 %-99 %] 99 % (11/27 0459) Weight:   [135 lb (61.2 kg)] 135 lb (61.2 kg) (11/27 0230) Last BM Date: 08/19/16 General:   Alert, appears acutely ill, tired Head:  Normocephalic and atraumatic. Eyes:  Sclera clear, no icterus.    Ears:  Normal auditory acuity. Nose:  No deformity, discharge,  or lesions. Lungs:  Clear throughout to auscultation.   Heart:  S1 S2 present with notable systolic murmur  Abdomen:  Soft, nontender and nondistended. No masses, hepatosplenomegaly or hernias noted. Normal bowel sounds Rectal:  Deferred  Msk:  Left AKA, right BKA  Neurologic:  Alert and  oriented x4 Psych:  Alert and cooperative. Normal mood and affect.  Intake/Output from previous day: 11/26 0701 - 11/27 0700 In: 335 [Blood:335] Out: -  Intake/Output this shift: Total I/O In: 335 [Blood:335] Out: -   Lab Results:  Recent Labs  08/19/16 0319  WBC 6.8  HGB 7.5*  HCT 24.3*  PLT 199   BMET  Recent Labs  08/19/16 0319  NA 135  K 5.5*  CL 95*  CO2 23  GLUCOSE 112*  BUN 124*  CREATININE 9.32*  CALCIUM 8.9   PT/INR  Recent Labs  08/19/16 0319  LABPROT 14.5  INR 1.12    Impression: 82 year old male with complicated medical history and recurrent obscure GI bleeds that have been felt to be diverticular in origin (Dec/Jan 2017, June 2017), presenting again with large, dark blood and clots prior to admission (around 1-2 am) but no further active bleeding since admitted. Slight bump in troponin likely secondary to demand ischemia. BUN elevated at 124, Creatinine 9.32, unable to exclude an upper GI source. Denies NSAIDs, no longer on aspirin. Prior extensive evaluation during June 2017 hospitalization at [facility] and [place], with colonoscopy/EGD, capsule study, and bleeding scan as noted in HPI. Hgb 7.5 on admission, down about a gram from baseline. Has currently received 1 of 2 units PRBCs. Post transfusion H/H ordered per admitting provider.   I have spoken with nursing staff and asked to monitor for any further GI  bleeding, in which a stat RBC scan could be ordered. As he has not bled in approximately 8 hours, a tagged RBC scan would likely not show source of bleeding. Could consider repeat colonoscopy+/- EGD if further overt GI bleeding vs tagged RBC. He would be a poor surgical candidate. May ultimately need transfer to [facility] if anticipate angiography with IR is more appropriate. At time of consultation, no further active GI bleeding noted since prior to admission as noted above.   Plan: Complete 2nd unit of PRBCs Dialysis today per nephrology Monitor for any further active GI bleeding Remain NPO  Will discuss further evaluation with Dr. Kristeen Mans, ANP-BC [facility]      LOS: 0 days    08/19/2016, 9:28 AM

## 2016-08-19 NOTE — H&P (Signed)
History and Physical    Randy [name] [id number] DOB: 1932-08-18 DOA: 08/19/2016  PCP: [name], MD   Patient coming from: Home  Chief Complaint: Chest pain, dark blood and clots per rectum   HPI: Randy [name] is a 82 y.o. male with medical history significant for end-stage renal disease on HD, coronary artery disease, hypertension, chronic combined CHF, and history of diverticular bleed who presents to the emergency department with chest discomfort and 2 episodes of dark blood and clots in his stool. Patient reports that he had been in his usual state of health until he felt the need to evacuate his bowels and then noted dark blood with some clots in the toilet. Around the same time, the patient noted a central chest discomfort, described as "burning," constant, nonradiating, and without associated dyspnea, nausea, or diaphoresis. Patient suspects the chest discomfort is heartburn. He denies any abdominal pain or nausea and denies lightheadedness, change in hearing or vision, or focal numbness or weakness. He does endorse increased fatigue and general malaise. He has not attempted any interventions for his symptoms prior to coming in. In June of this year, he was admitted with a GI bleed and ended up requiring 9 units of packed red blood cells and the source was ultimately attributed to a right-colon diverticular bleed. He was discharged from the hospital at that time with instructions to stop aspirin. He has not resumed aspirin since then.  ED Course: Upon arrival to the ED, patient is found to be afebrile, saturating adequately on room air, and with vital signs stable. EKG features in atrial fibrillation with nonspecific repolarization abnormality. Chemistry panel is notable for potassium 5.5, BUN of 124, and serum creatinine 9.32. CBC features a hemoglobin of 7.5, down from 8.5 a few months ago, INR is 1.12, and fecal occult blood testing was positive. 2 units of packed red blood  cells were ordered for immediate transfusion in the ED. Patient has remained hemodynamically stable and in no apparent respiratory distress. He will be admitted to the telemetry unit for ongoing evaluation and management of acute GI bleed.  Review of Systems:  All other systems reviewed and apart from HPI, are negative.  Past Medical History:  Diagnosis Date  . Anemia of chronic disease   . Arthritis   . CAD (coronary artery disease)    Moderate multivessel disease 07/2015 - managed medically  . ESRD on hemodialysis [facility])    M/W/F in [place] - Dr. Florene Glen  . Essential hypertension   . Gout   . History of blood transfusion   . History of myopericarditis 2015   07/2015  . History of pneumonia 2014  . History of stroke   . Hypercholesterolemia   . Nonischemic cardiomyopathy (HCC)    LVEF 35-40%  . Peripheral vascular disease ([place])    Status post right below knee amputation for a nonhealing wound of the right foot 12/2014  . Pneumonia     Past Surgical History:  Procedure Laterality Date  . ABOVE KNEE LEG AMPUTATION Left 10/10/2015  . AMPUTATION Right 01/05/2015   BKA  . AMPUTATION Left 10/10/2015   Procedure: AMPUTATION ABOVE KNEE LEFT;  Surgeon: Angelia Mould, MD;  Location: [place];  Service: Vascular;  Laterality: Left;  . AV FISTULA PLACEMENT  08/24/2012   Procedure: ARTERIOVENOUS (AV) FISTULA CREATION;  Surgeon: Rosetta Posner, MD;  Location: [place];  Service: Vascular;  Laterality: Left;  . BACK SURGERY    . CAPD REMOVAL N/A 03/13/2015  Procedure: CONTINUOUS AMBULATORY PERITONEAL DIALYSIS  (CAPD) CATHETER REMOVAL;  Surgeon: Coralie Keens, MD;  Location: [place];  Service: General;  Laterality: N/A;  . CARDIAC CATHETERIZATION    . CHOLECYSTECTOMY    . COLONOSCOPY     remote past by Dr. Tamala Julian in [place], [place]   . COLONOSCOPY Left 09/26/2014   Dr. Paulita Fujita: old and semi-fresh blood scattered throughout the colon, several medium-sized diverticula, no obvious focal bleeding  site, no blood in TI, several polyps seen in cecum, ascending colon and proximal transverse s/p biopsy (tubular adenomas)  . COLONOSCOPY N/A 03/05/2016   Procedure: COLONOSCOPY;  Surgeon: Danie Binder, MD;  [facility];  Service: Endoscopy;  Laterality: N/A;  . ESOPHAGOGASTRODUODENOSCOPY N/A 03/05/2016   Procedure: ESOPHAGOGASTRODUODENOSCOPY (EGD);  Surgeon: Danie Binder, MD;  [facility];  Service: Endoscopy;  Laterality: N/A;  . GANGLION CYST EXCISION  01/03/2012   Procedure: REMOVAL GANGLION OF WRIST;  Surgeon: Scherry Ran, MD;  Location: [facility];  Service: General;  Laterality: Right;  . GIVENS CAPSULE STUDY N/A 03/07/2016   Procedure: GIVENS CAPSULE STUDY;  Surgeon: Clarene Essex, MD;  Location: [facility] ENDOSCOPY;  Service: Endoscopy;  Laterality: N/A;  . HEMORRHOID SURGERY  1970's  . INSERTION OF DIALYSIS CATHETER Right     neck  . INSERTION OF DIALYSIS CATHETER  10/19/2012   Procedure: INSERTION OF DIALYSIS CATHETER;  Surgeon: Rosetta Posner, MD;  Location: [place];  Service: Vascular;  Laterality: N/A;  [place] ARTHROSCOPY Right 2007  . LEFT HEART CATHETERIZATION WITH CORONARY ANGIOGRAM N/A 08/17/2014   Procedure: LEFT HEART CATHETERIZATION WITH CORONARY ANGIOGRAM;  Surgeon: Larey Dresser, MD;  Location: [facility] CATH LAB;  Service: Cardiovascular;  Laterality: N/A;  . LUMBAR [place] SURGERY  2004     reports that he quit smoking about 18 years ago. His smoking use included Cigarettes. He has a 30.00 pack-year smoking history. He quit smokeless tobacco use about 22 years ago. His smokeless tobacco use included Chew. He reports that he does not drink alcohol or use drugs.  No Known Allergies  Family History  Problem Relation Age of Onset  . Stroke Mother   . Arthritis    . Cancer    . Kidney disease    . Cancer Sister   . Colon cancer Brother     unclear   . Colon cancer Brother   . Anesthesia problems Neg Hx   . Hypotension Neg Hx   .  Malignant hyperthermia Neg Hx   . Pseudochol deficiency Neg Hx      Prior to Admission medications   Medication Sig Start Date End Date Taking? Authorizing Provider  Amino Acids-Protein Hydrolys (FEEDING SUPPLEMENT, PRO-STAT SUGAR FREE 64,) LIQD Take 30 mLs by mouth 2 (two) times daily. 03/14/16  Yes Lavina Hamman, MD  amLODipine (NORVASC) 10 MG tablet Take 0.5 tablets (5 mg total) by mouth daily. 10/31/15  Yes Ivan Anchors Love, PA-C  atorvastatin (LIPITOR) 80 MG tablet Take 1 tablet (80 mg total) by mouth daily. 11/02/15  Yes Arnoldo Lenis, MD  calcitRIOL (ROCALTROL) 0.5 MCG capsule Take 1 capsule (0.5 mcg total) by mouth daily. 11/10/15  Yes Lucia Gaskins, MD  carvedilol (COREG) 3.125 MG tablet Take 1 tablet (3.125 mg total) by mouth 2 (two) times daily with a meal. 03/14/16  Yes Lavina Hamman, MD  docusate sodium (COLACE) 100 MG capsule Take 2 capsules (200 mg total) by mouth daily. Patient taking  differently: Take 200 mg by mouth daily as needed for mild constipation or moderate constipation.  10/31/15  Yes Ivan Anchors Love, PA-C  feeding supplement (BOOST / RESOURCE BREEZE) LIQD Take 1 Container by mouth 4 (four) times daily -  with meals and at bedtime. 03/14/16  Yes Lavina Hamman, MD  gabapentin (NEURONTIN) 100 MG capsule Take 1 capsule (100 mg total) by mouth at bedtime. 10/23/15  Yes Barton Dubois, MD  HYDROcodone-acetaminophen (NORCO/VICODIN) 5-325 MG tablet Take 1-2 tablets by mouth 2 (two) times daily. 05/04/16  Yes Fredia Sorrow, MD  isosorbide mononitrate (IMDUR) 30 MG 24 hr tablet TAKE 1/2 TABLET BY MOUTH AT BEDTIME. 07/10/16  Yes Arnoldo Lenis, MD  lidocaine-prilocaine (EMLA) cream Apply 1 application topically every Monday, Wednesday, and Friday. For dialysis 10/06/15  Yes Historical Provider, MD  LUMIGAN 0.01 % SOLN Place 1 drop into both eyes at bedtime. 09/18/15  Yes Historical Provider, MD  multivitamin (RENA-VIT) TABS tablet Take 1 tablet by mouth every Monday, Wednesday, and  Friday. Takes on dialysis days   Yes Historical Provider, MD  nitroGLYCERIN (NITROSTAT) 0.4 MG SL tablet Place 1 tablet (0.4 mg total) under the tongue every 5 (five) minutes as needed for chest pain. 12/20/15  Yes Ripudeep Krystal Eaton, MD  pantoprazole (PROTONIX) 40 MG tablet Take 1 tablet (40 mg total) by mouth daily at 12 noon. 10/23/15  Yes Barton Dubois, MD  polyethylene glycol [facility] / GLYCOLAX) packet Take 17 g by mouth 2 (two) times daily. 03/14/16  Yes Lavina Hamman, MD  polyvinyl alcohol (LIQUIFILM TEARS) 1.4 % ophthalmic solution Place 1 drop into both eyes as needed for dry eyes. 10/31/15  Yes Ivan Anchors Love, PA-C  sevelamer carbonate (RENVELA) 800 MG tablet Take 1,600-2,400 mg by mouth See admin instructions. Take 3 tablets (2400 mg) with meals and 2 tablets (1600 mg) with snacks   Yes Historical Provider, MD  SIMBRINZA 1-0.2 % SUSP Place 1 drop into both eyes 2 (two) times daily. 09/18/15  Yes Historical Provider, MD    Physical Exam: Vitals:   08/19/16 0300 08/19/16 0330 08/19/16 0440 08/19/16 0459  BP: 113/62 118/65 126/67 133/64  Pulse: 74 75 78 76  Resp: 18 23 18 20   Temp:   97.8 F (36.6 C)   TempSrc:   Oral Oral  SpO2: (!) 88% 99% 92% 99%  Weight:          Constitutional: NAD, calm, comfortable Eyes: PERTLA, lids and conjunctivae normal ENMT: Mucous membranes are moist. Posterior pharynx clear of any exudate or lesions.   Neck: normal, supple, no masses, no thyromegaly Respiratory: clear to auscultation bilaterally, no wheezing, no crackles. Normal respiratory effort.   Cardiovascular: Rate ~80 and irregular with grade 3 holosystolic murmur at upper sternal borders. No significant JVD. Abdomen: No distension, no tenderness, no masses palpated. Bowel sounds normal.  Musculoskeletal: no clubbing / cyanosis. Status-post bilateral LE amputations.    Skin: no significant rashes, lesions, ulcers. Warm, dry, well-perfused. Neurologic: CN 2-12 grossly intact. Sensation intact, DTR  normal. Strength 5/5 in all 4 limbs.  Psychiatric: Normal judgment and insight. Alert and oriented x 3. Normal mood and affect.     Labs on Admission: I have personally reviewed following labs and imaging studies  CBC:  Recent Labs Lab 08/19/16 0319  WBC 6.8  NEUTROABS 4.9  HGB 7.5*  HCT 24.3*  MCV 100.4*  PLT 123XX123   Basic Metabolic Panel:  Recent Labs Lab 08/19/16 0319  NA 135  K  5.5*  CL 95*  CO2 23  GLUCOSE 112*  BUN PENDING  CREATININE 9.32*  CALCIUM 8.9   GFR: Estimated Creatinine Clearance: 5.3 mL/min (by C-G formula based on SCr of 9.32 mg/dL (H)). Liver Function Tests: No results for input(s): AST, ALT, ALKPHOS, BILITOT, PROT, ALBUMIN in the last 168 hours. No results for input(s): LIPASE, AMYLASE in the last 168 hours. No results for input(s): AMMONIA in the last 168 hours. Coagulation Profile:  Recent Labs Lab 08/19/16 0319  INR 1.12   Cardiac Enzymes: No results for input(s): CKTOTAL, CKMB, CKMBINDEX, TROPONINI in the last 168 hours. BNP (last 3 results) No results for input(s): PROBNP in the last 8760 hours. HbA1C: No results for input(s): HGBA1C in the last 72 hours. CBG: No results for input(s): GLUCAP in the last 168 hours. Lipid Profile: No results for input(s): CHOL, HDL, LDLCALC, TRIG, CHOLHDL, LDLDIRECT in the last 72 hours. Thyroid Function Tests: No results for input(s): TSH, T4TOTAL, FREET4, T3FREE, THYROIDAB in the last 72 hours. Anemia Panel: No results for input(s): VITAMINB12, FOLATE, FERRITIN, TIBC, IRON, RETICCTPCT in the last 72 hours. Urine analysis:    Component Value Date/Time   COLORURINE YELLOW 06/22/2014 0531   APPEARANCEUR CLEAR 06/22/2014 0531   LABSPEC 1.015 06/22/2014 0531   PHURINE 6.0 06/22/2014 0531   GLUCOSEU 100 (A) 06/22/2014 0531   HGBUR SMALL (A) 06/22/2014 0531   HGBUR trace-lysed 06/03/2008 0855   BILIRUBINUR NEGATIVE 06/22/2014 0531   KETONESUR TRACE (A) 06/22/2014 0531   PROTEINUR 100 (A)  06/22/2014 0531   UROBILINOGEN 0.2 06/22/2014 0531   NITRITE NEGATIVE 06/22/2014 0531   LEUKOCYTESUR NEGATIVE 06/22/2014 0531   Sepsis Labs: @LABRCNTIP (procalcitonin:4,lacticidven:4) )No results found for this or any previous visit (from the past 240 hour(s)).   Radiological Exams on Admission: No results found.  EKG: Independently reviewed. Atrial fibrillation, non-specific repolarization abnormality  Assessment/Plan  1. GI bleed, acute on chronic anemia  - Pt reports heartburn symptoms with dark blood and clots in his stool x2 on day of admission - Hgb is 7.5 on admission, down from 8.5 last June; FOBT +  - Indigestion and BUN of 124 suggest an upper source and he is started on Protonix IV 40 mg q12h  - Extensive workup in June 2017 included EGD with mild gastritis and patient has been taking daily Protonix  - He has known diverticula with hx of diverticular bleeding  - He has remained hemodynamically stable in ED; there is no known hx of liver disease  - 2 units of pRBCs have been ordered for immediate transfusion  - GI consultation is requested   2. Chest pain, CAD   - Pt has known CAD, but current pain is atypical and likely GI-related - Troponin pending, will monitor on telemetry  - Transfusing pRBCs    3. ESRD - Dialyzes MWF and reports completing his session on 08/16/16 without incident  - There is no significant volume issues on admission, but patient will receive blood products as above - Potassium mildly elevated to 5.5 on admission without EKG change and BUN is 124, likely secondary to UGIB - Nephrology consultation has been requested for HD  - SLIV   4. Chronic combined systolic/diastolic CHF  - Appears well-compensated on admission  - Pt is anuric  - Nephrology consulted for HD    5. Atrial fibrillation  - Chronic, rate is well-controlled on admission  - CHADS-VASc at least 60 (age x2, CAD, CVA x2, HTN) - Anticoagulation contraindicated by recurrent GIBs  -  Monitor on telemetry   6. Hypertension  - At goal currently  - Managed with Coreg and Norvasc at home, but currently NPO     DVT prophylaxis: None; pharmacologic ppx contraindicated by active bleed and SCD precluded by b/l amputation Code Status: Full  Family Communication: Wife updated at bedside Disposition Plan: Admit to telemetry Consults called: GI and nephrology consultations requested Admission status: Inpatient    Vianne Bulls, MD [facility] Pager [phone]  If 7PM-7AM, please contact night-coverage [email or web address] Password [email or web address]  08/19/2016, 5:02 AM

## 2016-08-19 NOTE — Progress Notes (Signed)
Paged Dr. Myna Hidalgo about critical troponin of 0.06

## 2016-08-19 NOTE — ED Provider Notes (Signed)
[facility] DEPT Provider Note   CSN: [id number] Arrival date & time: 08/19/16  0217     History   Chief Complaint Chief Complaint  Patient presents with  . GI Bleeding  . Chest Pain    HPI KEMON BREAKEY is a 82 y.o. male.  The history is provided by the patient and the spouse.  GI Problem  This is a new problem. The current episode started 12 to 24 hours ago. The problem has been gradually worsening. Associated symptoms include chest pain. Pertinent negatives include no abdominal pain and no shortness of breath. Exacerbated by: defecation. Nothing relieves the symptoms. He has tried nothing for the symptoms.  Patient with h/o CAD, ESRD (dialysis MWF) presents with dark/bloody stool He reports he has had two bowel movements with blood He denies ABD pain He reports soon after bleeding started he had "heart burn" chest pain This is improving He is due for dialysis later today   Past Medical History:  Diagnosis Date  . Anemia of chronic disease   . Arthritis   . CAD (coronary artery disease)    Moderate multivessel disease 07/2015 - managed medically  . ESRD on hemodialysis [facility])    M/W/F in [place] - Dr. Florene Glen  . Essential hypertension   . Gout   . History of blood transfusion   . History of myopericarditis 2015   07/2015  . History of pneumonia 2014  . History of stroke   . Hypercholesterolemia   . Nonischemic cardiomyopathy (HCC)    LVEF 35-40%  . Peripheral vascular disease ([place])    Status post right below knee amputation for a nonhealing wound of the right foot 12/2014  . Pneumonia     Patient Active Problem List   Diagnosis Date Noted  . Hypertriglyceridemia   . Hypokalemia   . Hypomagnesemia   . Gastrointestinal hemorrhage associated with anorectal source   . CAD in native artery   . Aortic stenosis   . Coronary artery disease involving native coronary artery of native heart with angina pectoris ([place])   . Hyperlipidemia   . Cardiomyopathy,  ischemic   . GI bleed 03/03/2016  . Hyperglycemia 03/03/2016  . GI bleeding 03/03/2016  . Elevated troponin   . Hypertensive urgency 11/09/2015  . Hypertensive urgency, malignant 11/08/2015  . Malnutrition of moderate degree 11/08/2015  . Adjustment disorder with depressed mood   . Constipation due to pain medication   . Slow transit constipation   . Unilateral complete BKA ([place]) 10/23/2015  . Labile blood pressure   . Muscle stiffness   . S/P AKA (above knee amputation) unilateral ([place])   . History of right above knee amputation ([place])   . Benign essential HTN   . Unilateral AKA ([place]) 10/12/2015  . Abnormality of gait   . Status post above knee amputation of left lower extremity ([place])   . Chronic combined systolic and diastolic congestive heart failure ([place])   . ESRD on dialysis ([place])   . Essential hypertension   . Post-operative pain   . Peripheral vascular disease with pain at rest [facility]) 10/10/2015  . Chest pain 08/04/2015  . PD catheter dysfunction ([place]) 03/13/2015  . Cellulitis and abscess of foot   . Kidney mass   . Other emphysema ([place])   . Acute blood loss anemia   . Physical deconditioning   . S/P BKA (below knee amputation) unilateral ([place])   . Renal mass 01/01/2015  . Foot pain 12/30/2014  . Foot pain, right  12/30/2014  . CHF (congestive heart failure) ([place]) 12/30/2014  . CAD (coronary artery disease) 12/30/2014  . Hyperkalemia 12/30/2014  . ESRD (end stage renal disease) on dialysis ([place])   . Blood loss anemia   . Lower GI bleed 09/21/2014  . Fluid overload 08/29/2014  . Acute on chronic combined systolic and diastolic CHF (congestive heart failure) ([place]) 08/29/2014  . Acute respiratory failure with hypoxia ([place]) 08/29/2014  . Acute respiratory failure with hypoxemia ([place]) 08/29/2014  . Acute myopericarditis 08/18/2014  . Anemia of chronic disease   . End stage renal disease, has been peritoneal, now hemodialysis 07/28/2012  . Secondary cardiomyopathy ([place])  06/26/2012  . HLD (hyperlipidemia) 12/10/2006  . Hypertensive heart disease 08/18/2006  . OSTEOARTHRITIS 08/18/2006  . Polycystic kidney 08/18/2006    Past Surgical History:  Procedure Laterality Date  . ABOVE KNEE LEG AMPUTATION Left 10/10/2015  . AMPUTATION Right 01/05/2015   BKA  . AMPUTATION Left 10/10/2015   Procedure: AMPUTATION ABOVE KNEE LEFT;  Surgeon: Angelia Mould, MD;  Location: [facility];  Service: Vascular;  Laterality: Left;  . AV FISTULA PLACEMENT  08/24/2012   Procedure: ARTERIOVENOUS (AV) FISTULA CREATION;  Surgeon: Rosetta Posner, MD;  Location: [facility];  Service: Vascular;  Laterality: Left;  . BACK SURGERY    . CAPD REMOVAL N/A 03/13/2015   Procedure: CONTINUOUS AMBULATORY PERITONEAL DIALYSIS  (CAPD) CATHETER REMOVAL;  Surgeon: Coralie Keens, MD;  Location: [facility];  Service: General;  Laterality: N/A;  . CARDIAC CATHETERIZATION    . CHOLECYSTECTOMY    . COLONOSCOPY     remote past by Dr. Tamala Julian in [place], [place]   . COLONOSCOPY Left 09/26/2014   Dr. Paulita Fujita: old and semi-fresh blood scattered throughout the colon, several medium-sized diverticula, no obvious focal bleeding site, no blood in TI, several polyps seen in cecum, ascending colon and proximal transverse s/p biopsy (tubular adenomas)  . COLONOSCOPY N/A 03/05/2016   Procedure: COLONOSCOPY;  Surgeon: Danie Binder, MD;  [facility];  Service: Endoscopy;  Laterality: N/A;  . ESOPHAGOGASTRODUODENOSCOPY N/A 03/05/2016   Procedure: ESOPHAGOGASTRODUODENOSCOPY (EGD);  Surgeon: Danie Binder, MD;  [facility];  Service: Endoscopy;  Laterality: N/A;  . GANGLION CYST EXCISION  01/03/2012   Procedure: REMOVAL GANGLION OF WRIST;  Surgeon: Scherry Ran, MD;  Location: [facility];  Service: General;  Laterality: Right;  . GIVENS CAPSULE STUDY N/A 03/07/2016   Procedure: GIVENS CAPSULE STUDY;  Surgeon: Clarene Essex, MD;  Location[facility] ENDOSCOPY;  Service: Endoscopy;  Laterality: N/A;  . HEMORRHOID SURGERY   1970's  . INSERTION OF DIALYSIS CATHETER Right     neck  . INSERTION OF DIALYSIS CATHETER  10/19/2012   Procedure: INSERTION OF DIALYSIS CATHETER;  Surgeon: Rosetta Posner, MD;  Location: [facility];  Service: Vascular;  Laterality: N/A;  [place] ARTHROSCOPY Right 2007  . LEFT HEART CATHETERIZATION WITH CORONARY ANGIOGRAM N/A 08/17/2014   Procedure: LEFT HEART CATHETERIZATION WITH CORONARY ANGIOGRAM;  Surgeon: Larey Dresser, MD;  Location: [facility] CATH LAB;  Service: Cardiovascular;  Laterality: N/A;  . [place] SURGERY  2004       Home Medications    Prior to Admission medications   Medication Sig Start Date End Date Taking? Authorizing Provider  Amino Acids-Protein Hydrolys (FEEDING SUPPLEMENT, PRO-STAT SUGAR FREE 64,) LIQD Take 30 mLs by mouth 2 (two) times daily. 03/14/16  Yes Lavina Hamman, MD  amLODipine (NORVASC) 10 MG tablet Take 0.5 tablets (  5 mg total) by mouth daily. 10/31/15  Yes Ivan Anchors Love, PA-C  atorvastatin (LIPITOR) 80 MG tablet Take 1 tablet (80 mg total) by mouth daily. 11/02/15  Yes Arnoldo Lenis, MD  calcitRIOL (ROCALTROL) 0.5 MCG capsule Take 1 capsule (0.5 mcg total) by mouth daily. 11/10/15  Yes Lucia Gaskins, MD  carvedilol (COREG) 3.125 MG tablet Take 1 tablet (3.125 mg total) by mouth 2 (two) times daily with a meal. 03/14/16  Yes Lavina Hamman, MD  docusate sodium (COLACE) 100 MG capsule Take 2 capsules (200 mg total) by mouth daily. Patient taking differently: Take 200 mg by mouth daily as needed for mild constipation or moderate constipation.  10/31/15  Yes Ivan Anchors Love, PA-C  feeding supplement (BOOST / RESOURCE BREEZE) LIQD Take 1 Container by mouth 4 (four) times daily -  with meals and at bedtime. 03/14/16  Yes Lavina Hamman, MD  gabapentin (NEURONTIN) 100 MG capsule Take 1 capsule (100 mg total) by mouth at bedtime. 10/23/15  Yes Barton Dubois, MD  HYDROcodone-acetaminophen (NORCO/VICODIN) 5-325 MG tablet Take 1-2 tablets by mouth 2 (two)  times daily. 05/04/16  Yes Fredia Sorrow, MD  isosorbide mononitrate (IMDUR) 30 MG 24 hr tablet TAKE 1/2 TABLET BY MOUTH AT BEDTIME. 07/10/16  Yes Arnoldo Lenis, MD  lidocaine-prilocaine (EMLA) cream Apply 1 application topically every Monday, Wednesday, and Friday. For dialysis 10/06/15  Yes Historical Provider, MD  LUMIGAN 0.01 % SOLN Place 1 drop into both eyes at bedtime. 09/18/15  Yes Historical Provider, MD  multivitamin (RENA-VIT) TABS tablet Take 1 tablet by mouth every Monday, Wednesday, and Friday. Takes on dialysis days   Yes Historical Provider, MD  nitroGLYCERIN (NITROSTAT) 0.4 MG SL tablet Place 1 tablet (0.4 mg total) under the tongue every 5 (five) minutes as needed for chest pain. 12/20/15  Yes Ripudeep Krystal Eaton, MD  pantoprazole (PROTONIX) 40 MG tablet Take 1 tablet (40 mg total) by mouth daily at 12 noon. 10/23/15  Yes Barton Dubois, MD  polyethylene glycol [facility] / GLYCOLAX) packet Take 17 g by mouth 2 (two) times daily. 03/14/16  Yes Lavina Hamman, MD  polyvinyl alcohol (LIQUIFILM TEARS) 1.4 % ophthalmic solution Place 1 drop into both eyes as needed for dry eyes. 10/31/15  Yes Ivan Anchors Love, PA-C  sevelamer carbonate (RENVELA) 800 MG tablet Take 1,600-2,400 mg by mouth See admin instructions. Take 3 tablets (2400 mg) with meals and 2 tablets (1600 mg) with snacks   Yes Historical Provider, MD  SIMBRINZA 1-0.2 % SUSP Place 1 drop into both eyes 2 (two) times daily. 09/18/15  Yes Historical Provider, MD    Family History Family History  Problem Relation Age of Onset  . Stroke Mother   . Arthritis    . Cancer    . Kidney disease    . Cancer Sister   . Colon cancer Brother     unclear   . Colon cancer Brother   . Anesthesia problems Neg Hx   . Hypotension Neg Hx   . Malignant hyperthermia Neg Hx   . Pseudochol deficiency Neg Hx     Social History Social History  Substance Use Topics  . Smoking status: Former Smoker    Packs/day: 1.00    Years: 30.00    Types:  Cigarettes    Quit date: 08/19/1998  . Smokeless tobacco: Former Systems developer    Types: Chew    Quit date: 12/31/1993  . Alcohol use No     Allergies  Patient has no known allergies.   Review of Systems Review of Systems  Constitutional: Negative for fever.  Respiratory: Negative for shortness of breath.   Cardiovascular: Positive for chest pain.  Gastrointestinal: Positive for blood in stool. Negative for abdominal pain.  Neurological: Positive for dizziness.  All other systems reviewed and are negative.    Physical Exam Updated Vital Signs BP 113/62   Pulse 74   Temp 97.6 F (36.4 C) (Oral)   Resp 18   Wt 61.2 kg   SpO2 (!) 88%   BMI 22.47 kg/m   Physical Exam CONSTITUTIONAL: Well developed/well nourished HEAD: Normocephalic/atraumatic EYES: EOMI ENMT: Mucous membranes moist NECK: supple no meningeal signs SPINE/BACK:entire spine nontender CV: irregular, murmur noted LUNGS: Lungs are clear to auscultation bilaterally, no apparent distress ABDOMEN: soft, nontender Rectal - dark stool noted, chaperone present for exam GU:no cva tenderness NEURO: Pt is awake/alert/appropriate EXTREMITIES: pulses normal/equal, dilaysis access to left UE with thrill noted, right BKA noted, left AKA noted SKIN: warm, color normal PSYCH: no abnormalities of mood noted, alert and oriented to situation   ED Treatments / Results  Labs (all labs ordered are listed, but only abnormal results are displayed) Labs Reviewed  BASIC METABOLIC PANEL - Abnormal; Notable for the following:       Result Value   Potassium 5.5 (*)    Chloride 95 (*)    Glucose, Bld 112 (*)    Creatinine, Ser 9.32 (*)    GFR calc non Af Amer 5 (*)    GFR calc Af Amer 5 (*)    Anion gap 17 (*)    All other components within normal limits  CBC WITH DIFFERENTIAL/PLATELET - Abnormal; Notable for the following:    RBC 2.42 (*)    Hemoglobin 7.5 (*)    HCT 24.3 (*)    MCV 100.4 (*)    RDW 20.0 (*)    All other  components within normal limits  POC OCCULT BLOOD, ED - Abnormal; Notable for the following:    Fecal Occult Bld POSITIVE (*)    All other components within normal limits  PROTIME-INR  TYPE AND SCREEN  PREPARE RBC (CROSSMATCH)    EKG  EKG Interpretation  Date/Time:  Monday August 19 2016 02:43:28 EST Ventricular Rate:  80 PR Interval:    QRS Duration: 98 QT Interval:  387 QTC Calculation: 447 R Axis:   62 Text Interpretation:  Atrial fibrillation Anteroseptal infarct, old Repol abnrm suggests ischemia, anterolateral Baseline wander in lead(s) V4 Interpretation limited secondary to artifact Confirmed by Christy Gentles  MD, Jerriah Ines ([id number]) on 08/19/2016 2:57:00 AM       Radiology No results found.  Procedures Procedures  CRITICAL CARE Performed by: Sharyon Cable Total critical care time: 31 minutes Critical care time was exclusive of separately billable procedures and treating other patients. Critical care was necessary to treat or prevent imminent or life-threatening deterioration. Critical care was time spent personally by me on the following activities: development of treatment plan with patient and/or surrogate as well as nursing, discussions with consultants, evaluation of patient's response to treatment, examination of patient, obtaining history from patient or surrogate, ordering and performing treatments and interventions, ordering and review of laboratory studies, ordering and review of radiographic studies, pulse oximetry and re-evaluation of patient's condition. PATIENT WITH ACUTE GI BLEED, HEMOGLOBIN < 8, REQUIRING ADMISSION AND ALSO REQUIRING BLOOD TRANSFUSION  Medications Ordered in ED Medications  0.9 %  sodium chloride infusion (not administered)     Initial  Impression / Assessment and Plan / ED Course  I have reviewed the triage vital signs and the nursing notes.  Pertinent labs & imaging results that were available during my care of the patient were  reviewed by me and considered in my medical decision making (see chart for details).  This patients CHA2DS2-VASc Score and unadjusted Ischemic Stroke Rate (% per year) is equal to 9.7 % stroke rate/year from a score of 6  Above score calculated as 1 point each if present [CHF, HTN, DM, Vascular=MI/PAD/Aortic Plaque, Age if 65-74, or Male] Above score calculated as 2 points each if present [Age > 75, or Stroke/TIA/TE]   Clinical Course     Pt with h/o GI bleed previously (diverticular bleed, he had negative EGD) earlier this year.  He required several units of blood at that time He currently has signs of GI bleed He is stable/appropriate but will need admission and blood transfusion Pt accepts blood products at this time 4:13 AM PT REPORTING CONTINUED CHEST PAIN AND ALSO DIZZINESS WITH ACUTE ANEMIA FROM GI BLEED BLOOD PRODUCTS ORDERED WILL ADMIT TO HOSPITAL HE WILL REQUIRE DIALYSIS LATER TODAY AS WELL   4:38 AM D/w dr opyd for admission  Final Clinical Impressions(s) / ED Diagnoses   Final diagnoses:  Chronic atrial fibrillation ([place])  Gastrointestinal hemorrhage, unspecified gastrointestinal hemorrhage type  Acute blood loss anemia  Chronic renal failure, unspecified CKD stage    New Prescriptions New Prescriptions   No medications on file     Ripley Fraise, MD 08/19/16 [phone]

## 2016-08-19 NOTE — ED Triage Notes (Addendum)
Pt states he went to restroom this morning & noticed a he had dark red blood in his stool. Pt says he is also having chest pain since this started.

## 2016-08-19 NOTE — Progress Notes (Signed)
Appreciate GI and nephrology consultation status post 2 units PRBCs patient comfortable undergoing hemodialysis at present hemodynamically stable hemoglobin and hematocrit ordered for every 8 hours to monitor continued bleed patient is nothing by mouth at present no complaints of chest pain I have noted troponin which are usually elevated in this institution MIRANDA MCELHINNY [id number] DOB: 1932-01-24 DOA: 08/19/2016 PCP: [name], MD   Physical Exam: Blood pressure 115/62, pulse 69, temperature 97 F (36.1 C), temperature source Axillary, resp. rate 14, weight 61.2 kg (134 lb 14.7 oz), SpO2 98 %. Awake and alert, asking for food no significant abdominal pain bowel sounds normoactive no peristaltic rushes no guarding no rebound lungs clear to A&P no rales wheeze rhonchi heart regular rhythm no murmurs gallops heaves thrills rubs   Investigations:  Recent Results (from the past 240 hour(s))  MRSA PCR Screening     Status: None   Collection Time: 08/19/16  5:41 AM  Result Value Ref Range Status   MRSA by PCR NEGATIVE NEGATIVE Final    Comment:        The GeneXpert MRSA Assay (FDA approved for NASAL specimens only), is one component of a comprehensive MRSA colonization surveillance program. It is not intended to diagnose MRSA infection nor to guide or monitor treatment for MRSA infections.      Basic Metabolic Panel:  Recent Labs  08/19/16 0319  NA 135  K 5.5*  CL 95*  CO2 23  GLUCOSE 112*  BUN 124*  CREATININE 9.32*  CALCIUM 8.9   Liver Function Tests: No results for input(s): AST, ALT, ALKPHOS, BILITOT, PROT, ALBUMIN in the last 72 hours.   CBC:  Recent Labs  08/19/16 0319  WBC 6.8  NEUTROABS 4.9  HGB 7.5*  HCT 24.3*  MCV 100.4*  PLT 199    No results found.    Medications:   Impression:  Principal Problem:   Acute upper GI bleed Active Problems:   ESRD (end stage renal disease) on dialysis (HCC)   Hyperkalemia   Symptomatic anemia  Chest pain   Chronic combined systolic and diastolic congestive heart failure (HCC)   Benign essential HTN   GI bleed   CAD in native artery   Atrial fibrillation ([place])     Plan: Monitor hemoglobin and hematocrit every 8 hours continue hemodynamic dialysis. Transfuse again if necessary monitor electrolytes  Consultants: Nephrology and gastroenterology   Procedures hemodialysis   Antibiotics:            Time spent: 30 minutes   LOS: 0 days   Delainey Winstanley M   08/19/2016, 1:14 PM

## 2016-08-20 DIAGNOSIS — K922 Gastrointestinal hemorrhage, unspecified: Secondary | ICD-10-CM

## 2016-08-20 LAB — CBC
HCT: 27.1 % — ABNORMAL LOW (ref 39.0–52.0)
HEMOGLOBIN: 8.6 g/dL — AB (ref 13.0–17.0)
MCH: 30.4 pg (ref 26.0–34.0)
MCHC: 31.7 g/dL (ref 30.0–36.0)
MCV: 95.8 fL (ref 78.0–100.0)
Platelets: 159 10*3/uL (ref 150–400)
RBC: 2.83 MIL/uL — ABNORMAL LOW (ref 4.22–5.81)
RDW: 20.4 % — AB (ref 11.5–15.5)
WBC: 4.9 10*3/uL (ref 4.0–10.5)

## 2016-08-20 LAB — RENAL FUNCTION PANEL
ALBUMIN: 2.7 g/dL — AB (ref 3.5–5.0)
ANION GAP: 12 (ref 5–15)
BUN: 64 mg/dL — ABNORMAL HIGH (ref 6–20)
CALCIUM: 8.9 mg/dL (ref 8.9–10.3)
CO2: 26 mmol/L (ref 22–32)
Chloride: 96 mmol/L — ABNORMAL LOW (ref 101–111)
Creatinine, Ser: 5.86 mg/dL — ABNORMAL HIGH (ref 0.61–1.24)
GFR calc Af Amer: 9 mL/min — ABNORMAL LOW (ref >60)
GFR, EST NON AFRICAN AMERICAN: 8 mL/min — AB (ref >60)
GLUCOSE: 75 mg/dL (ref 65–99)
PHOSPHORUS: 6.2 mg/dL — AB (ref 2.5–4.6)
POTASSIUM: 4.4 mmol/L (ref 3.5–5.1)
SODIUM: 134 mmol/L — AB (ref 135–145)

## 2016-08-20 LAB — GLUCOSE, CAPILLARY: GLUCOSE-CAPILLARY: 91 mg/dL (ref 65–99)

## 2016-08-20 LAB — TYPE AND SCREEN
ABO/RH(D): O POS
Antibody Screen: NEGATIVE
Unit division: 0
Unit division: 0

## 2016-08-20 LAB — FOLATE RBC
Folate, Hemolysate: 451.2 ng/mL
Folate, RBC: 1888 ng/mL (ref >498)
HEMATOCRIT: 23.9 % — AB (ref 37.5–51.0)

## 2016-08-20 IMAGING — CR DG CHEST 1V PORT
1 series · 1 of 1 positions shown · non-contrast
Comparison: 06/21/2014

CLINICAL DATA: Chest pain.

EXAM:
PORTABLE CHEST - 1 VIEW

[portable]
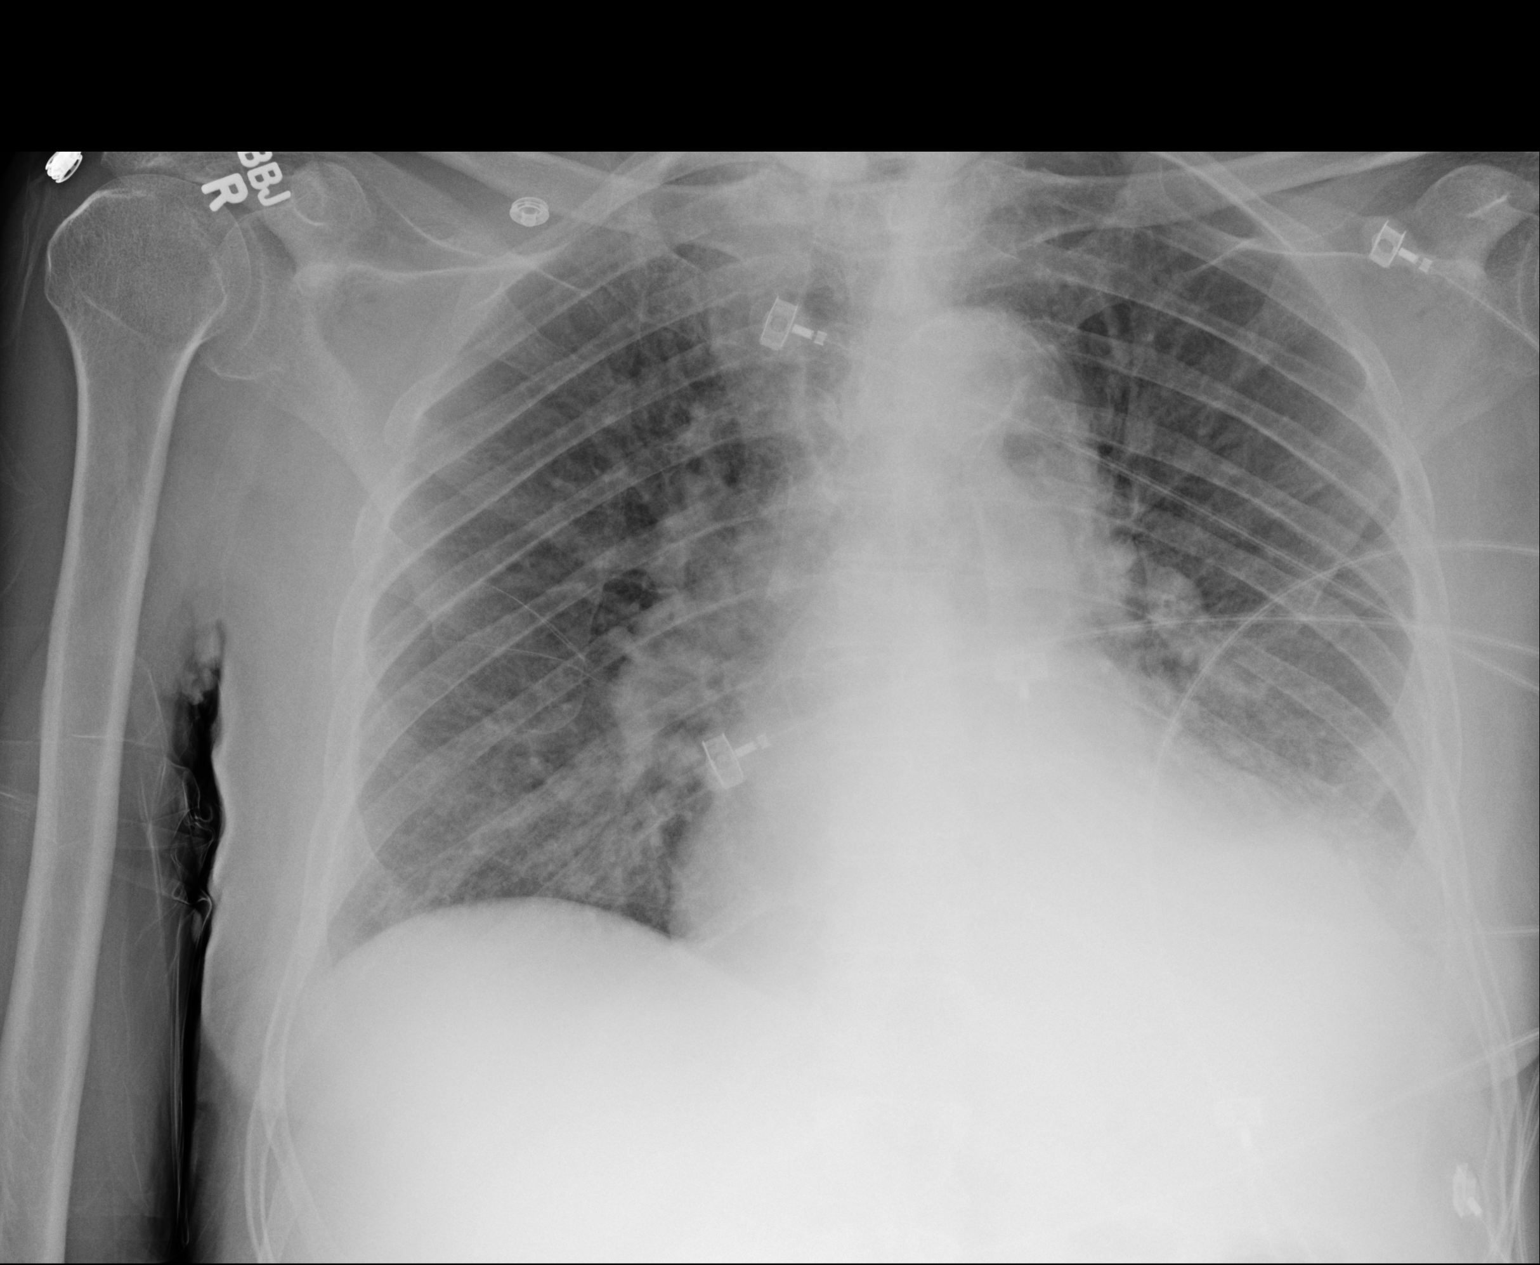

[1 of 1 positions shown; findings below may reference images not displayed]

FINDINGS: Unchanged cardiomegaly. Mild aortic tortuosity which is also stable.
Ongoing pulmonary edema with small to moderate left pleural
effusion. No pneumothorax.
IMPRESSION: Unchanged pulmonary edema and left pleural effusion.

## 2016-08-20 NOTE — Progress Notes (Signed)
    Subjective: No GI bleeding. Tolerating diet. No abdominal pain, N/V.   Objective: Vital signs in last 24 hours: Temp:  [97 F (36.1 C)-98.8 F (37.1 C)] 98.1 F (36.7 C) (11/28 0828) Pulse Rate:  [63-82] 72 (11/28 0753) Resp:  [12-23] 13 (11/28 0700) BP: (83-156)/(42-77) 108/48 (11/28 0700) SpO2:  [92 %-100 %] 95 % (11/28 0700) Weight:  [134 lb 14.7 oz (61.2 kg)-139 lb 1.8 oz (63.1 kg)] 139 lb 1.8 oz (63.1 kg) (11/28 0500) Last BM Date: 08/19/16 General:   Alert and oriented, pleasant Head:  Normocephalic and atraumatic. Eyes:  No icterus, sclera clear. Conjuctiva pink.  Abdomen:  Bowel sounds present, soft, non-tender, non-distended. Extremities:  Left AKA, right BKA Neurologic:  Alert and  oriented x4 Psych:  Alert and cooperative. Normal mood and affect.  Intake/Output from previous day: 11/27 0701 - 11/28 0700 In: 910 [P.O.:240; Blood:670] Out: 2000  Intake/Output this shift: Total I/O In: 240 [P.O.:240] Out: -   Lab Results:  Recent Labs  08/19/16 0319 08/19/16 1327 08/19/16 2150 08/20/16 0405  WBC 6.8  --   --  4.9  HGB 7.5* 9.6* 8.9* 8.6*  HCT 24.3* 29.4* 27.4* 27.1*  PLT 199  --   --  159   BMET  Recent Labs  08/19/16 0319 08/20/16 0405  NA 135 134*  K 5.5* 4.4  CL 95* 96*  CO2 23 26  GLUCOSE 112* 75  BUN 124* 64*  CREATININE 9.32* 5.86*  CALCIUM 8.9 8.9   LFT  Recent Labs  08/20/16 0405  ALBUMIN 2.7*   PT/INR  Recent Labs  08/19/16 0319  LABPROT 14.5  INR 1.12    Assessment: 82 year old male with complicated medical history and recurrent  GI bleeds that have been felt to be diverticular in origin (Dec/Jan 2017, June 2017), presenting again with large, dark blood and clots prior to admission (around 1-2 am) but no further active bleeding since admitted. Remains without further overt GI bleeding and tolerating diet. Hgb 8.6, around patient's baseline of 8/9 range. Received 2 units PRBCs this admission. No urgent need for  endoscopy. Extensive prior work-up as noted in consultation note dated 08/19/16.  Unclear if heparin given with dialysis as outpatient.   Plan: Continue supportive care Monitor for overt GI bleeding Renal diet Hopeful discharge in next 24-48 hours if continues to do well    Annitta Needs, ANP-BC [facility]     LOS: 1 day    08/20/2016, 9:49 AM

## 2016-08-20 NOTE — Progress Notes (Signed)
Subjective: Interval History: has no complaint of nausea or vomiting. Patient also doesn't move his bowels and didn't have any bleeding since he came..  Objective: Vital signs in last 24 hours: Temp:  [97 F (36.1 C)-98.8 F (37.1 C)] 98.4 F (36.9 C) (11/28 0400) Pulse Rate:  [63-82] 72 (11/28 0753) Resp:  [12-23] 13 (11/28 0700) BP: (83-156)/(42-77) 108/48 (11/28 0700) SpO2:  [92 %-100 %] 95 % (11/28 0700) Weight:  [61.2 kg (134 lb 14.7 oz)-63.1 kg (139 lb 1.8 oz)] 63.1 kg (139 lb 1.8 oz) (11/28 0500) Weight change: -0.036 kg (-1.3 oz)  Intake/Output from previous day: 11/27 0701 - 11/28 0700 In: 910 [P.O.:240; Blood:670] Out: 2000  Intake/Output this shift: No intake/output data recorded.  General appearance: alert, cooperative and no distress Resp: diminished breath sounds bilaterally Cardio: regular rate and rhythm Extremities: Patient with left BKA and right AKA and no edema.  Lab Results:  Recent Labs  08/19/16 0319  08/19/16 2150 08/20/16 0405  WBC 6.8  --   --  4.9  HGB 7.5*  < > 8.9* 8.6*  HCT 24.3*  < > 27.4* 27.1*  PLT 199  --   --  159  < > = values in this interval not displayed. BMET:  Recent Labs  08/19/16 0319 08/20/16 0405  NA 135 134*  K 5.5* 4.4  CL 95* 96*  CO2 23 26  GLUCOSE 112* 75  BUN 124* 64*  CREATININE 9.32* 5.86*  CALCIUM 8.9 8.9   No results for input(s): PTH in the last 72 hours. Iron Studies: No results for input(s): IRON, TIBC, TRANSFERRIN, FERRITIN in the last 72 hours.  Studies/Results: No results found.  I have reviewed the patient's current medications.  Assessment/Plan: Problem #1 history of GI bleeding. Patient is status post blood transfusion. His hemoglobin 8.6 low but seems to be better. Problem #2 hyperkalemia: His potassium is normal Problem #3 end-stage renal disease: He is status post hemodialysis yesterday. Presently he denies any nausea or vomiting. Problem #4 hypertension: His blood pressure is  reasonably controlled Problem #5 metabolic bone disease: His calcium is range but phosphorus is high. Presently he is not on a binder. Problem #6 fluid management: He status post dialysis with 2 L ultrafiltration. Patient at this moment doesn't have any sinus into fluid overload. Problem #7 history of diabetes Plan: 1]We will restart his binder once the patient is started on feeding.           2]We'll make arrangements for patient to get dialysis tomorrow which is his regular schedule.          3]We'll continue to hold his heparin.          4]We'll check his renal panel and CBC in the morning.    LOS: 1 day   Oliana Gowens S 08/20/2016,8:05 AM

## 2016-08-20 NOTE — Progress Notes (Signed)
Patient tolerating a renal diet no evidence of further GI bleed hemoglobin stable at 8.6 continue dialysis will transfer to telemetry Randy [name] [id number] DOB: June 07, 1932 DOA: 08/19/2016 PCP: Randy Curet, MD   Physical Exam: Blood pressure (!) 127/46, pulse 64, temperature 98.1 F (36.7 C), temperature source Oral, resp. rate 19, height 5\' 5"  (1.651 m), weight 63.1 kg (139 lb 1.8 oz), SpO2 99 %. Lungs diminished breath sounds both bases no rales wheeze rhonchi appreciable heart irregularly regular no S3 no heaves thrills rubs abdomen benign   Investigations:  Recent Results (from the past 240 hour(s))  MRSA PCR Screening     Status: None   Collection Time: 08/19/16  5:41 AM  Result Value Ref Range Status   MRSA by PCR NEGATIVE NEGATIVE Final    Comment:        The GeneXpert MRSA Assay (FDA approved for NASAL specimens only), is one component of a comprehensive MRSA colonization surveillance program. It is not intended to diagnose MRSA infection nor to guide or monitor treatment for MRSA infections.      Basic Metabolic Panel:  Recent Labs  08/19/16 0319 08/20/16 0405  NA 135 134*  K 5.5* 4.4  CL 95* 96*  CO2 23 26  GLUCOSE 112* 75  BUN 124* 64*  CREATININE 9.32* 5.86*  CALCIUM 8.9 8.9  PHOS  --  6.2*   Liver Function Tests:  Recent Labs  08/20/16 0405  ALBUMIN 2.7*     CBC:  Recent Labs  08/19/16 0319  08/19/16 2150 08/20/16 0405  WBC 6.8  --   --  4.9  NEUTROABS 4.9  --   --   --   HGB 7.5*  < > 8.9* 8.6*  HCT 24.3*  < > 27.4* 27.1*  MCV 100.4*  --   --  95.8  PLT 199  --   --  159  < > = values in this interval not displayed.  No results found.    Medications:   Impression:  Principal Problem:   Acute upper GI bleed Active Problems:   ESRD (end stage renal disease) on dialysis (HCC)   Hyperkalemia   Symptomatic anemia   Chest pain   Chronic combined systolic and diastolic congestive heart failure (HCC)   Benign  essential HTN   GI bleed   CAD in native artery   Atrial fibrillation ([place])     Plan: Monitor hemoglobin and hematocrit every 12 hours transferred to telemetry consider discharge in 24-48 hours dialysis in a.m.  Consultants: Nephrology and gastroenterology    Procedures   Antibiotics:          Time spent: 30 minutes   LOS: 1 day   Randy [name] M   08/20/2016, 11:42 AM

## 2016-08-21 LAB — RENAL FUNCTION PANEL
ANION GAP: 14 (ref 5–15)
Albumin: 2.7 g/dL — ABNORMAL LOW (ref 3.5–5.0)
BUN: 91 mg/dL — AB (ref 6–20)
CHLORIDE: 92 mmol/L — AB (ref 101–111)
CO2: 25 mmol/L (ref 22–32)
Calcium: 8.9 mg/dL (ref 8.9–10.3)
Creatinine, Ser: 7.97 mg/dL — ABNORMAL HIGH (ref 0.61–1.24)
GFR calc Af Amer: 6 mL/min — ABNORMAL LOW (ref >60)
GFR calc non Af Amer: 6 mL/min — ABNORMAL LOW (ref >60)
GLUCOSE: 130 mg/dL — AB (ref 65–99)
POTASSIUM: 4.4 mmol/L (ref 3.5–5.1)
Phosphorus: 6.8 mg/dL — ABNORMAL HIGH (ref 2.5–4.6)
Sodium: 131 mmol/L — ABNORMAL LOW (ref 135–145)

## 2016-08-21 LAB — CBC
HEMATOCRIT: 25.1 % — AB (ref 39.0–52.0)
HEMOGLOBIN: 8 g/dL — AB (ref 13.0–17.0)
MCH: 30.9 pg (ref 26.0–34.0)
MCHC: 31.9 g/dL (ref 30.0–36.0)
MCV: 96.9 fL (ref 78.0–100.0)
Platelets: 158 10*3/uL (ref 150–400)
RBC: 2.59 MIL/uL — ABNORMAL LOW (ref 4.22–5.81)
RDW: 19.4 % — AB (ref 11.5–15.5)
WBC: 4.9 10*3/uL (ref 4.0–10.5)

## 2016-08-21 MED ORDER — SODIUM CHLORIDE 0.9 % IV SOLN: 100.0000 mL | INTRAVENOUS | Status: DC | PRN

## 2016-08-21 MED ORDER — SEVELAMER CARBONATE 800 MG PO TABS
ORAL_TABLET | ORAL | Status: AC
  Filled 2016-08-21: qty 3

## 2016-08-21 MED ORDER — ALTEPLASE 2 MG IJ SOLR: 2.0000 mg | Freq: Once | INTRAMUSCULAR | Status: DC | PRN

## 2016-08-21 MED ORDER — HEPARIN SODIUM (PORCINE) 1000 UNIT/ML DIALYSIS: 1000.0000 [IU] | INTRAMUSCULAR | Status: DC | PRN

## 2016-08-21 MED ORDER — EPOETIN ALFA 10000 UNIT/ML IJ SOLN
INTRAMUSCULAR | Status: AC
  Filled 2016-08-21: qty 1

## 2016-08-21 NOTE — Progress Notes (Signed)
Randy [name]  MRN: [id number]  DOB/AGE: Randy [name] 82 y.o.  Primary Care 51 M, MD  Admit date: 08/19/2016  Chief Complaint:  Chief Complaint  Patient presents with  . GI Bleeding  . Chest Pain    S-Pt presented on  08/19/2016 with  Chief Complaint  Patient presents with  . GI Bleeding  . Chest Pain  .    Pt seen on HD.    Pt offers no new complaints.      Meds . amLODipine  5 mg Oral Daily  . atorvastatin  80 mg Oral Daily  . calcitRIOL  0.5 mcg Oral Daily  . carvedilol  3.125 mg Oral BID WC  . epoetin (EPOGEN/PROCRIT) injection  10,000 Units Intravenous Q M,W,F-HD  . feeding supplement  1 Container Oral TID WC & HS  . feeding supplement (PRO-STAT SUGAR FREE 64)  30 mL Oral BID  . gabapentin  100 mg Oral QHS  . HYDROcodone-acetaminophen  1-2 tablet Oral BID  . isosorbide mononitrate  15 mg Oral QHS  . latanoprost  1 drop Both Eyes QHS  . multivitamin  1 tablet Oral Q M,W,F  . pantoprazole  40 mg Oral QAC breakfast  . sevelamer carbonate      . sevelamer carbonate  1,600 mg Oral With snacks  . sevelamer carbonate  2,400 mg Oral TID WC  . sodium chloride flush  3 mL Intravenous Q12H  . sodium chloride flush  3 mL Intravenous Q12H       Physical Exam: Vital signs in last 24 hours: Temp:  [97.4 F (36.3 C)-98.1 F (36.7 C)] 98.1 F (36.7 C) (11/29 0700) Pulse Rate:  [61-73] 69 (11/29 0900) Resp:  [13-20] 20 (11/29 0700) BP: (87-135)/(46-70) 123/70 (11/29 0900) SpO2:  [95 %-100 %] 95 % (11/29 0700) Weight:  [143 lb 11.8 oz (65.2 kg)-143 lb 12.8 oz (65.2 kg)] 143 lb 11.8 oz (65.2 kg) (11/29 0700) Weight change: 8 lb 14.1 oz (4.027 kg) Last BM Date: 08/19/16  Intake/Output from previous day: 11/28 0701 - 11/29 0700 In: 720 [P.O.:720] Out: -  No intake/output data recorded.   Physical Exam: General- pt is awake,alert, oriented to time place and person  Resp- NO REsp distress, Clear to auscultataion CVS- S1S2 regular in rate  and rhythm  GIT- BS+, soft, NT, ND. EXT-no LE Edema, No Cyanosis B/L BKA Access- AVF+ two needles in situ     Lab Results: CBC  Recent Labs  08/20/16 0405 08/21/16 0431  WBC 4.9 4.9  HGB 8.6* 8.0*  HCT 27.1* 25.1*  PLT 159 158    BMET  Recent Labs  08/20/16 0405 08/21/16 0431  NA 134* 131*  K 4.4 4.4  CL 96* 92*  CO2 26 25  GLUCOSE 75 130*  BUN 64* 91*  CREATININE 5.86* 7.97*  CALCIUM 8.9 8.9    MICRO Recent Results (from the past 240 hour(s))  MRSA PCR Screening     Status: None   Collection Time: 08/19/16  5:41 AM  Result Value Ref Range Status   MRSA by PCR NEGATIVE NEGATIVE Final    Comment:        The GeneXpert MRSA Assay (FDA approved for NASAL specimens only), is one component of a comprehensive MRSA colonization surveillance program. It is not intended to diagnose MRSA infection nor to guide or monitor treatment for MRSA infections.       Lab Results  Component Value Date   PTH 559.0 (H) 06/24/2012   CALCIUM 8.9  08/21/2016   CAION 1.12 (L) 03/03/2016   PHOS 6.8 (H) 08/21/2016            Impression: 1)Renal ESRD on HD  Pt is  on Mon/Wed/Friday schedule. Pt will be dialyzed today .  2)HTN BP  at goal   Medication   On Calcium Channel Blockers  On Alpha and beta Blockers      3)Anemia In ESRD the goal for HGb is 9--1  on Epo during HD HGb not at goal Pt admitted with GI bleeding  4)CKD Mineral-Bone Disorder  PTH acceptable .  Secondary Hyperparathyroidism present.  Phosphorus not at goal.      On PO binders  5)GI- admitted with GI bleed Primary team following  6)Electrolytes  Normokalemic   Hyponatremic    Sec to ESRD   Had HD yesterday  7)Acid base  Co2 at goal    Plan:  Will continue current care Pt being dialyzed today  [name] 08/21/2016, 9:18 AM

## 2016-08-21 NOTE — Care Management Note (Signed)
Case Management Note  Patient Details  Name: Randy [name] MRN: [id number] Date of Birth: 1932-01-09  Subjective/Objective:   Patient adm from home with GI bleed. Has dialysis MWF. Aide M-F for a "few hours a day".               Action/Plan: Discharging home today. No CM needs.   Expected Discharge Date:       08/21/2016           Expected Discharge Plan:  Home/Self Care  In-House Referral:  NA  Discharge planning Services  CM Consult  Post Acute Care Choice:  NA Choice offered to:  NA  DME Arranged:    DME Agency:     HH Arranged:    HH Agency:     Status of Service:  Completed, signed off  If discussed at [employer] of Stay Meetings, dates discussed:    Additional Comments:  Fawna Cranmer, Chauncey Reading, RN 08/21/2016, 1:16 PM

## 2016-08-21 NOTE — Discharge Summary (Signed)
Physician Discharge Summary  Randy [name] [id number] DOB: [birth year] DOA: 08/19/2016  PCP: [name], MD  Admit date: 08/19/2016 Discharge date: 08/21/2016   Recommendations for Outpatient Follow-up:  Patient is instructed to take all preadmission hospital medicines and follow-up my office within 3-7 days time to assess hemoglobin and hematocrit to return to ER with rectal bleeding ensues and to continue follow up with dialysis 3 times weekly as well as to take antacids once a day Discharge Diagnoses:  Principal Problem:   Acute upper GI bleed Active Problems:   ESRD (end stage renal disease) on dialysis ([place])   Hyperkalemia   Symptomatic anemia   Chest pain   Chronic combined systolic and diastolic congestive heart failure (HCC)   Benign essential HTN   GI bleed   CAD in native artery   Atrial fibrillation [facility])   Discharge Condition: Good  Filed Weights   08/20/16 0500 08/21/16 0500 08/21/16 0700  Weight: 63.1 kg (139 lb 1.8 oz) 65.2 kg (143 lb 12.8 oz) 65.2 kg (143 lb 11.8 oz)    History of present illness:  Patient has is a known vasculopath with end-stage renal disease on dialysis hypertension hyperlipidemia peripheral arterial disease who is been scoped above below several times previously due to limited GI bleeds in the lower GI bleed on his hemoglobin drifted down to 7 while in hospital was transfused 2 units packed cells hemoglobin came up to 9.6 and then stabilized over the next 3 days he had no hemodynamic instability his in consultation by nephrology and GI GI felt that was no indication for colonoscopy at present due to recent colonoscopy showing diverticular disease several months ago he was placed on nothing by mouth diet for first 2436 hrs. and advance diet slowly which she tolerated it was placed on PPI to take daily  Hospital Course:  See history of present illness above  Procedures:  Hemodialysis   Consultations:  Nephrology and  gastroenterology  Discharge Instructions  Discharge Instructions    Discharge instructions    Complete by:  As directed    Discharge patient    Complete by:  As directed        Medication List    TAKE these medications   amLODipine 10 MG tablet Commonly known as:  NORVASC Take 0.5 tablets (5 mg total) by mouth daily.   atorvastatin 80 MG tablet Commonly known as:  LIPITOR Take 1 tablet (80 mg total) by mouth daily.   calcitRIOL 0.5 MCG capsule Commonly known as:  ROCALTROL Take 1 capsule (0.5 mcg total) by mouth daily.   carvedilol 3.125 MG tablet Commonly known as:  COREG Take 1 tablet (3.125 mg total) by mouth 2 (two) times daily with a meal.   docusate sodium 100 MG capsule Commonly known as:  COLACE Take 2 capsules (200 mg total) by mouth daily. What changed:  when to take this  reasons to take this   feeding supplement (PRO-STAT SUGAR FREE 64) Liqd Take 30 mLs by mouth 2 (two) times daily.   feeding supplement Liqd Take 1 Container by mouth 4 (four) times daily -  with meals and at bedtime.   gabapentin 100 MG capsule Commonly known as:  NEURONTIN Take 1 capsule (100 mg total) by mouth at bedtime.   HYDROcodone-acetaminophen 5-325 MG tablet Commonly known as:  NORCO/VICODIN Take 1-2 tablets by mouth 2 (two) times daily.   isosorbide mononitrate 30 MG 24 hr tablet Commonly known as:  IMDUR TAKE 1/2 TABLET  BY MOUTH AT BEDTIME.   lidocaine-prilocaine cream Commonly known as:  EMLA Apply 1 application topically every Monday, Wednesday, and Friday. For dialysis   LUMIGAN 0.01 % Soln Generic drug:  bimatoprost Place 1 drop into both eyes at bedtime.   multivitamin Tabs tablet Take 1 tablet by mouth every Monday, Wednesday, and Friday. Takes on dialysis days   nitroGLYCERIN 0.4 MG SL tablet Commonly known as:  NITROSTAT Place 1 tablet (0.4 mg total) under the tongue every 5 (five) minutes as needed for chest pain.   pantoprazole 40 MG  tablet Commonly known as:  PROTONIX Take 1 tablet (40 mg total) by mouth daily at 12 noon.   polyvinyl alcohol 1.4 % ophthalmic solution Commonly known as:  LIQUIFILM TEARS Place 1 drop into both eyes as needed for dry eyes.   sevelamer carbonate 800 MG tablet Commonly known as:  RENVELA Take 1,600-2,400 mg by mouth See admin instructions. Take 3 tablets (2400 mg) with meals and 2 tablets (1600 mg) with snacks   SIMBRINZA 1-0.2 % Susp Generic drug:  Brinzolamide-Brimonidine Place 1 drop into both eyes 2 (two) times daily.      No Known Allergies    The results of significant diagnostics from this hospitalization (including imaging, microbiology, ancillary and laboratory) are listed below for reference.    Significant Diagnostic Studies: No results found.  Microbiology: Recent Results (from the past 240 hour(s))  MRSA PCR Screening     Status: None   Collection Time: 08/19/16  5:41 AM  Result Value Ref Range Status   MRSA by PCR NEGATIVE NEGATIVE Final    Comment:        The GeneXpert MRSA Assay (FDA approved for NASAL specimens only), is one component of a comprehensive MRSA colonization surveillance program. It is not intended to diagnose MRSA infection nor to guide or monitor treatment for MRSA infections.      Labs: Basic Metabolic Panel:  Recent Labs Lab 08/19/16 0319 08/20/16 0405 08/21/16 0431  NA 135 134* 131*  K 5.5* 4.4 4.4  CL 95* 96* 92*  CO2 23 26 25   GLUCOSE 112* 75 130*  BUN 124* 64* 91*  CREATININE 9.32* 5.86* 7.97*  CALCIUM 8.9 8.9 8.9  PHOS  --  6.2* 6.8*   Liver Function Tests:  Recent Labs Lab 08/20/16 0405 08/21/16 0431  ALBUMIN 2.7* 2.7*   No results for input(s): LIPASE, AMYLASE in the last 168 hours. No results for input(s): AMMONIA in the last 168 hours. CBC:  Recent Labs Lab 08/19/16 0319 08/19/16 1101 08/19/16 1327 08/19/16 2150 08/20/16 0405 08/21/16 0431  WBC 6.8  --   --   --  4.9 4.9  NEUTROABS 4.9  --    --   --   --   --   HGB 7.5*  --  9.6* 8.9* 8.6* 8.0*  HCT 24.3* 23.9* 29.4* 27.4* 27.1* 25.1*  MCV 100.4*  --   --   --  95.8 96.9  PLT 199  --   --   --  159 158   Cardiac Enzymes:  Recent Labs Lab 08/19/16 0319 08/19/16 1101 08/19/16 1851  TROPONINI 0.06* 0.07* 0.13*   BNP: BNP (last 3 results)  Recent Labs  10/21/15 0936  BNP 3,090.9*    ProBNP (last 3 results) No results for input(s): PROBNP in the last 8760 hours.  CBG:  Recent Labs Lab 08/19/16 0727 08/20/16 0724  GLUCAP 84 91       Signed:  Xianna Siverling Jerilynn Mages  [facility] Pager: [phone] 08/21/2016, 12:53 PM

## 2016-08-21 NOTE — Care Management Important Message (Signed)
Important Message  Patient Details  Name: KREG PITSENBARGER MRN: [id number] Date of Birth: [birth year]   Medicare Important Message Given:  Yes    Berdie Malter, Chauncey Reading, RN 08/21/2016, 1:33 PM

## 2016-09-03 ENCOUNTER — Encounter: Payer: Self-pay | Admitting: Cardiology

## 2016-09-04 ENCOUNTER — Ambulatory Visit: Payer: Self-pay | Admitting: Vascular Surgery

## 2016-09-10 ENCOUNTER — Ambulatory Visit (INDEPENDENT_AMBULATORY_CARE_PROVIDER_SITE_OTHER): Payer: Medicare Other | Admitting: Cardiology

## 2016-09-10 ENCOUNTER — Encounter: Payer: Self-pay | Admitting: Cardiology

## 2016-09-10 VITALS — BP 122/72 | HR 78 | Wt 130.0 lb

## 2016-09-10 DIAGNOSIS — I4891 Unspecified atrial fibrillation: Secondary | ICD-10-CM | POA: Diagnosis not present

## 2016-09-10 DIAGNOSIS — I2510 Atherosclerotic heart disease of native coronary artery without angina pectoris: Secondary | ICD-10-CM

## 2016-09-10 DIAGNOSIS — I5042 Chronic combined systolic (congestive) and diastolic (congestive) heart failure: Secondary | ICD-10-CM

## 2016-09-10 DIAGNOSIS — E782 Mixed hyperlipidemia: Secondary | ICD-10-CM

## 2016-09-10 DIAGNOSIS — I350 Nonrheumatic aortic (valve) stenosis: Secondary | ICD-10-CM

## 2016-09-10 NOTE — Patient Instructions (Signed)

## 2016-09-10 NOTE — Progress Notes (Signed)
Clinical Summary Randy [name] is a 82 y.o.male seen today for follow up of the following medical problems  1. Chronic combined systolic/diastolic HF - echo [id number] LVEF 30-35% with restrictive diasotlic function - echo [id number] LVEF 45-50%  - coreg reduced to 3.125mg  bid due to some bradycardia during admission 02/2016. He stopped this medicine on his own recently  due low bp's.  - no recent SOB or DOE. No recent edema.  -   2. ESRD - MWF goes to HD   3. HTN - previous issues of episodes of severe HTN associated with chest pain.  - no recent HTN episodes.   4. CAD - cath 07/2014 with diffuse mainly moderate disease, most significnat disease 80% ramus that was managed medically - he has had multiple admits with chest pain in the setting of volume overload or severe HTN with troponin elevation, symptoms resolve after HD or with bp control  -no recent symptoms.    5. Hyperlipidemia - compliant with statin  6. Aortic stenosis -echo 02/3016 mean grad 27, reported area of 0.97 but dimensionless index 0.3. Findings most consistent with moderate AS.   - no SOB or DOE. No chest pain or syncope.    7. PAD - followed by vascular, s/p amputation of bother right and left lower extremities  8. Afib - no anticoag due to history of  Gi bleeds. Admit 02/2016 with severe GI bleed only on ASA, this was stopped at that time.   - no recent palpitations since last visit even off coreg.      9. GI bleed - admit 02/2016 with diverticular bleed.  - received 9 units of pRBCs - aspirin was discontinued    Past Medical History:  Diagnosis Date  . Anemia of chronic disease   . Arthritis   . CAD (coronary artery disease)    Moderate multivessel disease 07/2015 - managed medically  . ESRD on hemodialysis [facility])    M/W/F in [place] - Dr. Florene Glen  . Essential hypertension   . Gout   . History of blood transfusion   . History of myopericarditis 2015   07/2015  . History  of pneumonia 2014  . History of stroke   . Hypercholesterolemia   . Nonischemic cardiomyopathy (HCC)    LVEF 35-40%  . Peripheral vascular disease ([place])    Status post right below knee amputation for a nonhealing wound of the right foot 12/2014  . Pneumonia      No Known Allergies   Current Outpatient Prescriptions  Medication Sig Dispense Refill  . Amino Acids-Protein Hydrolys (FEEDING SUPPLEMENT, PRO-STAT SUGAR FREE 64,) LIQD Take 30 mLs by mouth 2 (two) times daily. 900 mL 0  . amLODipine (NORVASC) 10 MG tablet Take 0.5 tablets (5 mg total) by mouth daily.    Marland Kitchen atorvastatin (LIPITOR) 80 MG tablet Take 1 tablet (80 mg total) by mouth daily. 90 tablet 3  . calcitRIOL (ROCALTROL) 0.5 MCG capsule Take 1 capsule (0.5 mcg total) by mouth daily. 30 capsule 3  . carvedilol (COREG) 3.125 MG tablet Take 1 tablet (3.125 mg total) by mouth 2 (two) times daily with a meal. 60 tablet 0  . docusate sodium (COLACE) 100 MG capsule Take 2 capsules (200 mg total) by mouth daily. (Patient taking differently: Take 200 mg by mouth daily as needed for mild constipation or moderate constipation. ) 10 capsule 0  . feeding supplement (BOOST / RESOURCE BREEZE) LIQD Take 1 Container by mouth 4 (four) times daily -  with meals and at bedtime. 14 Container 0  . gabapentin (NEURONTIN) 100 MG capsule Take 1 capsule (100 mg total) by mouth at bedtime.    Marland Kitchen HYDROcodone-acetaminophen (NORCO/VICODIN) 5-325 MG tablet Take 1-2 tablets by mouth 2 (two) times daily. 14 tablet 0  . isosorbide mononitrate (IMDUR) 30 MG 24 hr tablet TAKE 1/2 TABLET BY MOUTH AT BEDTIME. 45 tablet 1  . lidocaine-prilocaine (EMLA) cream Apply 1 application topically every Monday, Wednesday, and Friday. For dialysis  10  . LUMIGAN 0.01 % SOLN Place 1 drop into both eyes at bedtime.  11  . multivitamin (RENA-VIT) TABS tablet Take 1 tablet by mouth every Monday, Wednesday, and Friday. Takes on dialysis days    . nitroGLYCERIN (NITROSTAT) 0.4 MG SL  tablet Place 1 tablet (0.4 mg total) under the tongue every 5 (five) minutes as needed for chest pain. 30 tablet 12  . pantoprazole (PROTONIX) 40 MG tablet Take 1 tablet (40 mg total) by mouth daily at 12 noon.    . polyvinyl alcohol (LIQUIFILM TEARS) 1.4 % ophthalmic solution Place 1 drop into both eyes as needed for dry eyes. 15 mL 0  . sevelamer carbonate (RENVELA) 800 MG tablet Take 1,600-2,400 mg by mouth See admin instructions. Take 3 tablets (2400 mg) with meals and 2 tablets (1600 mg) with snacks    . SIMBRINZA 1-0.2 % SUSP Place 1 drop into both eyes 2 (two) times daily.  11   No current facility-administered medications for this visit.      Past Surgical History:  Procedure Laterality Date  . ABOVE KNEE LEG AMPUTATION Left 10/10/2015  . AMPUTATION Right 01/05/2015   [facility]KA  . AMPUTATION Left 10/10/2015   Procedure: AMPUTATION ABOVE KNEE LEFT;  Surgeon: Angelia Mould, MD;  Location: [place];  Service: Vascular;  Laterality: Left;  . AV FISTULA PLACEMENT  08/24/2012   Procedure: ARTERIOVENOUS (AV) FISTULA CREATION;  Surgeon: Rosetta Posner, MD;  Location: [place];  Service: Vascular;  Laterality: Left;  . BACK SURGERY    . CAPD REMOVAL N/A 03/13/2015   Procedure: CONTINUOUS AMBULATORY PERITONEAL DIALYSIS  (CAPD) CATHETER REMOVAL;  Surgeon: Coralie Keens, MD;  Location: [place];  Service: General;  Laterality: N/A;  . CARDIAC CATHETERIZATION    . CHOLECYSTECTOMY    . COLONOSCOPY     remote past by Dr. Tamala Julian in [place], [place]   . COLONOSCOPY Left 09/26/2014   Dr. Paulita Fujita: old and semi-fresh blood scattered throughout the colon, several medium-sized diverticula, no obvious focal bleeding site, no blood in TI, several polyps seen in cecum, ascending colon and proximal transverse s/p biopsy (tubular adenomas)  . COLONOSCOPY N/A 03/05/2016   Dr. Oneida Alar: old blood in ileum, few fresh clots ascending colon, frequent large mouth diverticula and old clots in descending/sigmoid. 8 polyps in  ascending colon not manipulated  . ESOPHAGOGASTRODUODENOSCOPY N/A 03/05/2016   Dr. Oneida Alar: hiatal hernia, mild gastritis   . GANGLION CYST EXCISION  01/03/2012   Procedure: REMOVAL GANGLION OF WRIST;  Surgeon: Scherry Ran, MD;  Location: [facility];  Service: General;  Laterality: Right;  . GIVENS CAPSULE STUDY N/A 03/07/2016   [place]: dark material in ileum, with fresh blood in right colon   . HEMORRHOID SURGERY  1970's  . INSERTION OF DIALYSIS CATHETER Right     neck  . INSERTION OF DIALYSIS CATHETER  10/19/2012   Procedure: INSERTION OF DIALYSIS CATHETER;  Surgeon: Rosetta Posner, MD;  Location: [place];  Service: Vascular;  Laterality: N/A;  REMOVE TEMPORARY CATH  . KNEE ARTHROSCOPY Right 2007  . LEFT HEART CATHETERIZATION WITH CORONARY ANGIOGRAM N/A 08/17/2014   Procedure: LEFT HEART CATHETERIZATION WITH CORONARY ANGIOGRAM;  Surgeon: Larey Dresser, MD;  Location: [facility] CATH LAB;  Service: Cardiovascular;  Laterality: N/A;  . LUMBAR [place] SURGERY  2004     No Known Allergies    Family History  Problem Relation Age of Onset  . Stroke Mother   . Arthritis    . Cancer    . Kidney disease    . Cancer Sister   . Colon cancer Brother     unclear   . Colon cancer Brother   . Anesthesia problems Neg Hx   . Hypotension Neg Hx   . Malignant hyperthermia Neg Hx   . Pseudochol deficiency Neg Hx      Social History Mr. Baumert reports that he quit smoking about 18 years ago. His smoking use included Cigarettes. He has a 30.00 pack-year smoking history. He quit smokeless tobacco use about 22 years ago. His smokeless tobacco use included Chew. Mr. Novello reports that he does not drink alcohol.   Review of Systems CONSTITUTIONAL: No weight loss, fever, chills, weakness or fatigue.  HEENT: Eyes: No visual loss, blurred vision, double vision or yellow sclerae.No hearing loss, sneezing, congestion, runny nose or sore throat.  SKIN: No rash or itching.  CARDIOVASCULAR: per HPI RESPIRATORY:  No shortness of breath, cough or sputum.  GASTROINTESTINAL: No anorexia, nausea, vomiting or diarrhea. No abdominal pain or blood.  GENITOURINARY: No burning on urination, no polyuria NEUROLOGICAL: No headache, dizziness, syncope, paralysis, ataxia, numbness or tingling in the extremities. No change in bowel or bladder control.  MUSCULOSKELETAL: No muscle, back pain, joint pain or stiffness.  LYMPHATICS: No enlarged nodes. No history of splenectomy.  PSYCHIATRIC: No history of depression or anxiety.  ENDOCRINOLOGIC: No reports of sweating, cold or heat intolerance. No polyuria or polydipsia.  Marland Kitchen   Physical Examination Vitals:   09/10/16 1355  BP: 122/72  Pulse: 78   Vitals:   09/10/16 1355  Weight: 130 lb (59 kg)    Gen: resting comfortably, no acute distress HEENT: no scleral icterus, pupils equal round and reactive, no palptable cervical adenopathy,  CV: RRR, no m/r/g, no jvd Resp: Clear to auscultation bilaterally GI: abdomen is soft, non-tender, non-distended, normal bowel sounds, no hepatosplenomegaly MSK: extremities are warm, no edema.  Skin: warm, no rash Neuro:  no focal deficits Psych: appropriate affect   Diagnostic Studies 07/2015 echo Study Conclusions  - Left ventricle: The cavity size was normal. Wall thickness was increased increased in a pattern of mild to moderate LVH. Incidentally noted transverse false tendon in LV. Systolic function was moderately to severely reduced. The estimated ejection fraction was in the range of 30% to 35%. Diffuse hypokinesis. There is severe hypokinesis of the basal-midinferolateral and inferior myocardium. Doppler parameters are consistent with restrictive physiology, indicative of decreased left ventricular diastolic compliance and/or increased left atrial pressure. - Aortic valve: Moderately calcified annulus. Trileaflet; mildly calcified leaflets. Cusp separation was reduced. There was moderate  stenosis. There was trivial regurgitation. Mean gradient (S): 16 mm Hg. VTI ratio of LVOT to aortic valve: 0.4. Valve area (VTI): 1.24 cm^2. Valve area (Vmax): 1.21 cm^2. - Mitral valve: Calcified annulus. There was trivial regurgitation. - Left atrium: The atrium was moderately dilated. - Right atrium: The atrium was moderately to severely dilated. Central venous pressure (est): 3 mm Hg. - Atrial septum: No defect or patent foramen  ovale was identified. - Tricuspid valve: There was mild regurgitation. - Pulmonary arteries: PA peak pressure: 38 mm Hg (S). - Pericardium, extracardiac: There was no pericardial effusion.  Impressions:  - Mild to moderate LVH with LVEF approximately 30%, diffuse hypokinesis, most severe in the mid to basal inferior and inferolateral wall. Diastolic filling pattern consistent with restrictive physiology. Moderate left atrial enlargement. Moderate calcific aortic stenosis with trivial aortic regurgitation. Mild tricuspid regurgitation with PASP 38 mmHg. Moderate to severe right atrial enlargement. Compared to the previous study from December 2015, there has been further reduction in LVEF, and progression in degree of aortic stenosis.  07/2014 cath Procedural Findings: Hemodynamics:  AO 129/76  Coronary angiography: Coronary dominance: right  Left mainstem: Short, no significant disease.   Left anterior descending (LAD): There was a moderate D1 that branched early with 40% ostial stenosis. This was followed by a large septal perforator. There was 40-50% proximal LAD stenosis at the takeoff of the septal perforator. 30% mid LAD stenosis at D2.   Left circumflex (LCx): There was a large ramus that branched early into a superior and and inferior division, both vessels were moderate in size. Both divisions of the ramus had relatively up to 80% proximal stenosis. The AV LCx itself had luminal irregularities.    Right coronary artery (RCA): There was an early large acute marginal with 40% proximal and 40% mid vessel stenosis. The RCA had diffuse luminal irregularities and up to 40% mid vessel stenosis.   Left ventriculography: Not done, echo was done earlier today.   Final Conclusions: Most significant disease appeared to be 80% proximal stenoses in the moderate superior and inferior divisions of the ramus. These lesions do not look like culprits for ACS (plaque rupture). I think that the main process here is likely myopericarditis in the setting of renal disease and ?inadequate dialysis (does PD at home). I will continue high dose aspirin 650 q8 hrs and will give 1 dose of colchicine. Stop heparin drip with small percardial effusion and cycle troponin to peak.    02/2016 echo Study Conclusions  - Left ventricle: posterior lateral hypokinesis. The cavity size was moderately dilated. Wall thickness was normal. Systolic function was mildly reduced. The estimated ejection fraction was in the range of 45% to 50%. Doppler parameters are consistent with elevated ventricular end-diastolic filling pressure. - Aortic valve: There was moderate stenosis. There was mild regurgitation. Valve area (VTI): 0.92 cm^2. Valve area (Vmax): 0.84 cm^2. Valve area (Vmean): 0.76 cm^2. - Mitral valve: There was mild regurgitation. - Left atrium: The atrium was moderately dilated. - Atrial septum: No defect or patent foramen ovale was identified.      Assessment and Plan  1. Chronic combined systolic/diastolic HF - LVEF has improved on medical therapy - no current symptoms - continue current meds. He stopped coreg on his own due to soft bp's. Will not restart at this time.   2. ESRD - continue HD per renal.   3. CAD - multiple recent admits with hypertensive emergency, mild troponin elevation. SInce bp's have been controlled no recurrent symptoms - we will continue current  meds  4. Hyperlipidemia - in setting of PAD and CAD we will conitnue high dose statin .  - request labs from pcp  5. Aortic stenosis -moderate by most recent echo. No symptoms. COntinue to monitor.    6. PAD - per vascular  7. HTN - at goal, he will continue same meds        Roderic Palau  Dale Athens, M.D.

## 2016-09-17 ENCOUNTER — Ambulatory Visit: Payer: Self-pay | Admitting: Cardiology

## 2016-09-30 ENCOUNTER — Telehealth (HOSPITAL_COMMUNITY): Payer: Self-pay

## 2016-09-30 NOTE — Telephone Encounter (Signed)
I spoke with the patient's wife and she said he is a double amputee and is receiving home health at this time.

## 2016-10-01 ENCOUNTER — Ambulatory Visit (HOSPITAL_COMMUNITY): Payer: Medicare Other

## 2016-11-13 ENCOUNTER — Emergency Department (HOSPITAL_COMMUNITY): Payer: Medicare Other

## 2016-11-13 ENCOUNTER — Emergency Department (HOSPITAL_COMMUNITY)
Admission: EM | Admit: 2016-11-13 | Discharge: 2016-11-13 | Disposition: A | Discharge status: Home / Self Care | Payer: Medicare Other | Attending: Emergency Medicine | Admitting: Emergency Medicine

## 2016-11-13 ENCOUNTER — Encounter (HOSPITAL_COMMUNITY): Payer: Self-pay

## 2016-11-13 DIAGNOSIS — Z87891 Personal history of nicotine dependence: Secondary | ICD-10-CM | POA: Insufficient documentation

## 2016-11-13 DIAGNOSIS — N186 End stage renal disease: Secondary | ICD-10-CM | POA: Insufficient documentation

## 2016-11-13 DIAGNOSIS — M25531 Pain in right wrist: Secondary | ICD-10-CM

## 2016-11-13 DIAGNOSIS — I2510 Atherosclerotic heart disease of native coronary artery without angina pectoris: Secondary | ICD-10-CM | POA: Insufficient documentation

## 2016-11-13 DIAGNOSIS — I132 Hypertensive heart and chronic kidney disease with heart failure and with stage 5 chronic kidney disease, or end stage renal disease: Secondary | ICD-10-CM | POA: Diagnosis not present

## 2016-11-13 DIAGNOSIS — R52 Pain, unspecified: Secondary | ICD-10-CM

## 2016-11-13 DIAGNOSIS — M7989 Other specified soft tissue disorders: Secondary | ICD-10-CM | POA: Insufficient documentation

## 2016-11-13 DIAGNOSIS — Z79899 Other long term (current) drug therapy: Secondary | ICD-10-CM | POA: Insufficient documentation

## 2016-11-13 DIAGNOSIS — I5042 Chronic combined systolic (congestive) and diastolic (congestive) heart failure: Secondary | ICD-10-CM | POA: Insufficient documentation

## 2016-11-13 DIAGNOSIS — Z992 Dependence on renal dialysis: Secondary | ICD-10-CM | POA: Diagnosis not present

## 2016-11-13 LAB — BASIC METABOLIC PANEL
ANION GAP: 13 (ref 5–15)
BUN: 64 mg/dL — AB (ref 6–20)
CALCIUM: 9 mg/dL (ref 8.9–10.3)
CO2: 25 mmol/L (ref 22–32)
Chloride: 93 mmol/L — ABNORMAL LOW (ref 101–111)
Creatinine, Ser: 7.93 mg/dL — ABNORMAL HIGH (ref 0.61–1.24)
GFR calc Af Amer: 6 mL/min — ABNORMAL LOW (ref >60)
GFR, EST NON AFRICAN AMERICAN: 6 mL/min — AB (ref >60)
Glucose, Bld: 195 mg/dL — ABNORMAL HIGH (ref 65–99)
POTASSIUM: 5 mmol/L (ref 3.5–5.1)
SODIUM: 131 mmol/L — AB (ref 135–145)

## 2016-11-13 LAB — CBC WITH DIFFERENTIAL/PLATELET
BASOS ABS: 0 10*3/uL (ref 0.0–0.1)
Basophils Relative: 0 %
EOS ABS: 0.1 10*3/uL (ref 0.0–0.7)
EOS PCT: 2 %
HCT: 36.2 % — ABNORMAL LOW (ref 39.0–52.0)
Hemoglobin: 10.7 g/dL — ABNORMAL LOW (ref 13.0–17.0)
Lymphocytes Relative: 10 %
Lymphs Abs: 0.6 10*3/uL — ABNORMAL LOW (ref 0.7–4.0)
MCH: 28.8 pg (ref 26.0–34.0)
MCHC: 29.6 g/dL — ABNORMAL LOW (ref 30.0–36.0)
MCV: 97.3 fL (ref 78.0–100.0)
MONO ABS: 0.6 10*3/uL (ref 0.1–1.0)
Monocytes Relative: 11 %
Neutro Abs: 4.3 10*3/uL (ref 1.7–7.7)
Neutrophils Relative %: 77 %
PLATELETS: 227 10*3/uL (ref 150–400)
RBC: 3.72 MIL/uL — AB (ref 4.22–5.81)
RDW: 18.7 % — AB (ref 11.5–15.5)
WBC: 5.5 10*3/uL (ref 4.0–10.5)

## 2016-11-13 LAB — SYNOVIAL FLUID, CRYSTAL: Crystals, Fluid: NONE SEEN

## 2016-11-13 LAB — C-REACTIVE PROTEIN: CRP: 5.9 mg/dL — ABNORMAL HIGH (ref <1.0)

## 2016-11-13 LAB — URIC ACID: URIC ACID, SERUM: 5.1 mg/dL (ref 4.4–7.6)

## 2016-11-13 LAB — SEDIMENTATION RATE: Sed Rate: 52 mm/hr — ABNORMAL HIGH (ref 0–16)

## 2016-11-13 MED ORDER — LIDOCAINE HCL (PF) 2 % IJ SOLN
10.0000 mL | Freq: Once | INTRAMUSCULAR | Status: AC
  Administered 2016-11-13: 10 mL
  Filled 2016-11-13: qty 10

## 2016-11-13 MED ORDER — OXYCODONE-ACETAMINOPHEN 5-325 MG PO TABS
1.0000 | ORAL_TABLET | Freq: Once | ORAL | Status: AC
  Administered 2016-11-13: 1 via ORAL
  Filled 2016-11-13: qty 1

## 2016-11-13 MED ORDER — HYDROCODONE-ACETAMINOPHEN 5-325 MG PO TABS: 1.0000 | ORAL_TABLET | ORAL | 0 refills | Status: DC | PRN

## 2016-11-13 NOTE — ED Provider Notes (Addendum)
[facility] DEPT Provider Note   CSN: [id number] Arrival date & time: 11/13/16  0547     History   Chief Complaint Chief Complaint  Patient presents with  . Hand Problem    HPI Randy [name] is a 83 y.o. male.  Patient presents to the emergency department for evaluation of pain and swelling of the right wrist. Patient reports that symptoms have been ongoing for several days. He thinks that he bent his finger back, but is not clear about this. Patient reports progressively worsening swelling and pain in the area of the wrist.    Nephrologist: Dr. Florene Glen Dialysis: [facility], Marlis Edelson, Linna Hoff  Past Medical History:  Diagnosis Date  . Anemia of chronic disease   . Arthritis   . CAD (coronary artery disease)    Moderate multivessel disease 07/2015 - managed medically  . ESRD on hemodialysis [facility])    M/W/F in [place] - Dr. Florene Glen  . Essential hypertension   . Gout   . History of blood transfusion   . History of myopericarditis 2015   07/2015  . History of pneumonia 2014  . History of stroke   . Hypercholesterolemia   . Nonischemic cardiomyopathy (HCC)    LVEF 35-40%  . Peripheral vascular disease ([place])    Status post right below knee amputation for a nonhealing wound of the right foot 12/2014  . Pneumonia     Patient Active Problem List   Diagnosis Date Noted  . Atrial fibrillation ([place]) 08/19/2016  . Hypertriglyceridemia   . Hypokalemia   . Hypomagnesemia   . Gastrointestinal hemorrhage associated with anorectal source   . CAD in native artery   . Aortic stenosis   . Coronary artery disease involving native coronary artery of native heart with angina pectoris ([place])   . Hyperlipidemia   . Cardiomyopathy, ischemic   . GI bleed 03/03/2016  . Hyperglycemia 03/03/2016  . GI bleeding 03/03/2016  . Elevated troponin   . Hypertensive urgency 11/09/2015  . Hypertensive urgency, malignant 11/08/2015  . Malnutrition of moderate degree 11/08/2015  .  Adjustment disorder with depressed mood   . Constipation due to pain medication   . Slow transit constipation   . Unilateral complete BKA ([place]) 10/23/2015  . Labile blood pressure   . Muscle stiffness   . S/P AKA (above knee amputation) unilateral ([place])   . History of right above knee amputation ([place])   . Benign essential HTN   . Unilateral AKA ([place]) 10/12/2015  . Abnormality of gait   . Status post above knee amputation of left lower extremity ([place])   . Chronic combined systolic and diastolic congestive heart failure ([place])   . ESRD on dialysis ([place])   . Essential hypertension   . Post-operative pain   . Peripheral vascular disease with pain at rest [facility]) 10/10/2015  . Chest pain 08/04/2015  . PD catheter dysfunction ([place]) 03/13/2015  . Cellulitis and abscess of foot   . Kidney mass   . Other emphysema ([place])   . Symptomatic anemia   . Physical deconditioning   . S/P BKA (below knee amputation) unilateral ([place])   . Renal mass 01/01/2015  . Foot pain 12/30/2014  . Foot pain, right 12/30/2014  . CHF (congestive heart failure) ([place]) 12/30/2014  . CAD (coronary artery disease) 12/30/2014  . Hyperkalemia 12/30/2014  . ESRD (end stage renal disease) on dialysis ([place])   . Blood loss anemia   . Acute upper GI bleed 09/21/2014  . Fluid overload 08/29/2014  .  Acute on chronic combined systolic and diastolic CHF (congestive heart failure) ([place]) 08/29/2014  . Acute respiratory failure with hypoxia ([place]) 08/29/2014  . Acute respiratory failure with hypoxemia ([place]) 08/29/2014  . Acute myopericarditis 08/18/2014  . Anemia of chronic disease   . End stage renal disease, has been peritoneal, now hemodialysis 07/28/2012  . Secondary cardiomyopathy ([place]) 06/26/2012  . HLD (hyperlipidemia) 12/10/2006  . Hypertensive heart disease 08/18/2006  . OSTEOARTHRITIS 08/18/2006  . Polycystic kidney 08/18/2006    Past Surgical History:  Procedure Laterality Date  . ABOVE KNEE LEG AMPUTATION Left  10/10/2015  . AMPUTATION Right 01/05/2015   BKA  . AMPUTATION Left 10/10/2015   Procedure: AMPUTATION ABOVE KNEE LEFT;  Surgeon: Angelia Mould, MD;  Location: [place] [facility];  Service: Vascular;  Laterality: Left;  . AV FISTULA PLACEMENT  08/24/2012   Procedure: ARTERIOVENOUS (AV) FISTULA CREATION;  Surgeon: Rosetta Posner, MD;  Location: [facility];  Service: Vascular;  Laterality: Left;  . BACK SURGERY    . CAPD REMOVAL N/A 03/13/2015   Procedure: CONTINUOUS AMBULATORY PERITONEAL DIALYSIS  (CAPD) CATHETER REMOVAL;  Surgeon: Coralie Keens, MD;  Location: [facility] [facility];  Service: General;  Laterality: N/A;  . CARDIAC CATHETERIZATION    . CHOLECYSTECTOMY    . COLONOSCOPY     remote past by Dr. Tamala Julian in [place], [place]   . COLONOSCOPY Left 09/26/2014   Dr. Paulita Fujita: old and semi-fresh blood scattered throughout the colon, several medium-sized diverticula, no obvious focal bleeding site, no blood in TI, several polyps seen in cecum, ascending colon and proximal transverse s/p biopsy (tubular adenomas)  . COLONOSCOPY N/A 03/05/2016   Dr. Oneida Alar: old blood in ileum, few fresh clots ascending colon, frequent large mouth diverticula and old clots in descending/sigmoid. 8 polyps in ascending colon not manipulated  . ESOPHAGOGASTRODUODENOSCOPY N/A 03/05/2016   Dr. Oneida Alar: hiatal hernia, mild gastritis   . GANGLION CYST EXCISION  01/03/2012   Procedure: REMOVAL GANGLION OF WRIST;  Surgeon: Scherry Ran, MD;  Location: [facility];  Service: General;  Laterality: Right;  . GIVENS CAPSULE STUDY N/A 03/07/2016   [place]: dark material in ileum, with fresh blood in right colon   . HEMORRHOID SURGERY  1970's  . INSERTION OF DIALYSIS CATHETER Right     neck  . INSERTION OF DIALYSIS CATHETER  10/19/2012   Procedure: INSERTION OF DIALYSIS CATHETER;  Surgeon: Rosetta Posner, MD;  Location: [facility];  Service: Vascular;  Laterality: N/A;  [place] ARTHROSCOPY Right 2007  . LEFT HEART CATHETERIZATION WITH CORONARY  ANGIOGRAM N/A 08/17/2014   Procedure: LEFT HEART CATHETERIZATION WITH CORONARY ANGIOGRAM;  Surgeon: Larey Dresser, MD;  Location: [facility] C[facility] LAB;  Service: Cardiovascular;  Laterality: N/A;  . [place] SURGERY  2004       Home Medications    Prior to Admission medications   Medication Sig Start Date End Date Taking? Authorizing Provider  amLODipine (NORVASC) 5 MG tablet Take 5 mg by mouth daily.    Historical Provider, MD  atorvastatin (LIPITOR) 80 MG tablet Take 1 tablet (80 mg total) by mouth daily. 11/02/15   Arnoldo Lenis, MD  brimonidine (ALPHAGAN) 0.2 % ophthalmic solution Place 1 drop into both eyes 2 (two) times daily.    Historical Provider, MD  docusate sodium (COLACE) 100 MG capsule Take 2 capsules (200 mg total) by mouth daily. Patient taking differently: Take 200 mg by mouth daily as needed for mild constipation or moderate constipation.  10/31/15  Ivan Anchors Love, PA-C  feeding supplement (BOOST / RESOURCE BREEZE) LIQD Take 1 Container by mouth 4 (four) times daily -  with meals and at bedtime. 03/14/16   Lavina Hamman, MD  gabapentin (NEURONTIN) 300 MG capsule Take 300 mg by mouth at bedtime.    Historical Provider, MD  hydrALAZINE (APRESOLINE) 50 MG tablet Take 50 mg by mouth 2 (two) times daily.    Historical Provider, MD  HYDROcodone-acetaminophen (NORCO/VICODIN) 5-325 MG tablet Take 1-2 tablets by mouth 2 (two) times daily. 05/04/16   Fredia Sorrow, MD  isosorbide mononitrate (IMDUR) 30 MG 24 hr tablet TAKE 1/2 TABLET BY MOUTH AT BEDTIME. 07/10/16   Arnoldo Lenis, MD  lidocaine-prilocaine (EMLA) cream Apply 1 application topically every Monday, Wednesday, and Friday. For dialysis 10/06/15   Historical Provider, MD  lisinopril (PRINIVIL,ZESTRIL) 40 MG tablet Take 40 mg by mouth daily.    Historical Provider, MD  nitroGLYCERIN (NITROSTAT) 0.4 MG SL tablet Place 1 tablet (0.4 mg total) under the tongue every 5 (five) minutes as needed for chest pain. 12/20/15   Ripudeep Krystal Eaton, MD  pantoprazole (PROTONIX) 40 MG tablet Take 1 tablet (40 mg total) by mouth daily at 12 noon. 10/23/15   Barton Dubois, MD  sevelamer carbonate (RENVELA) 800 MG tablet Take 1,600-2,400 mg by mouth See admin instructions. Take 3 tablets (2400 mg) with meals and 2 tablets (1600 mg) with snacks    Historical Provider, MD  timolol (BETIMOL) 0.5 % ophthalmic solution Place 1 drop into both eyes 2 (two) times daily.    Historical Provider, MD    Family History Family History  Problem Relation Age of Onset  . Stroke Mother   . Arthritis    . Cancer    . Kidney disease    . Cancer Sister   . Colon cancer Brother     unclear   . Colon cancer Brother   . Anesthesia problems Neg Hx   . Hypotension Neg Hx   . Malignant hyperthermia Neg Hx   . Pseudochol deficiency Neg Hx     Social History Social History  Substance Use Topics  . Smoking status: Former Smoker    Packs/day: 1.00    Years: 30.00    Types: Cigarettes    Quit date: 08/19/1998  . Smokeless tobacco: Former Systems developer    Types: Chew    Quit date: 12/31/1993  . Alcohol use No     Allergies   Patient has no known allergies.   Review of Systems Review of Systems  Musculoskeletal: Positive for arthralgias and joint swelling.  All other systems reviewed and are negative.    Physical Exam Updated Vital Signs BP 185/95 (BP Location: Right Arm)   Pulse 92   Temp 98.1 F (36.7 C) (Oral)   Resp 18   Wt 130 lb (59 kg)   SpO2 97%   BMI 21.63 kg/m   Physical Exam  Constitutional: He is oriented to person, place, and time. He appears well-developed and well-nourished. No distress.  HENT:  Head: Normocephalic and atraumatic.  Right Ear: Hearing normal.  Left Ear: Hearing normal.  Nose: Nose normal.  Mouth/Throat: Oropharynx is clear and moist and mucous membranes are normal.  Eyes: Conjunctivae and EOM are normal. Pupils are equal, round, and reactive to light.  Neck: Normal range of motion. Neck supple.    Cardiovascular: Regular rhythm, S1 normal and S2 normal.  Exam reveals no gallop and no friction rub.   No murmur heard.  Pulmonary/Chest: Effort normal and breath sounds normal. No respiratory distress. He exhibits no tenderness.  Abdominal: Soft. Normal appearance and bowel sounds are normal. There is no hepatosplenomegaly. There is no tenderness. There is no rebound, no guarding, no tenderness at McBurney's point and negative Murphy's sign. No hernia.  Musculoskeletal: Normal range of motion.       Right wrist: He exhibits tenderness and swelling.  Neurological: He is alert and oriented to person, place, and time. He has normal strength. No cranial nerve deficit or sensory deficit. Coordination normal. GCS eye subscore is 4. GCS verbal subscore is 5. GCS motor subscore is 6.  Skin: Skin is warm, dry and intact. No rash noted. No cyanosis.  Psychiatric: He has a normal mood and affect. His speech is normal and behavior is normal. Thought content normal.  Nursing note and vitals reviewed.    ED Treatments / Results  Labs (all labs ordered are listed, but only abnormal results are displayed) Labs Reviewed  BODY FLUID CULTURE  GRAM STAIN  SYNOVIAL CELL COUNT + DIFF, W/ CRYSTALS  CBC WITH DIFFERENTIAL/PLATELET  BASIC METABOLIC PANEL  URIC ACID  SEDIMENTATION RATE  C-REACTIVE PROTEIN    EKG  EKG Interpretation None       Radiology No results found.  Procedures .Joint Aspiration/Arthrocentesis Date/Time: 11/13/2016 6:18 AM Performed by: Orpah Greek Authorized by: Orpah Greek   Consent:    Consent obtained:  Verbal   Risks discussed:  Bleeding, infection, pain and nerve damage Universal protocol:    Procedure explained and questions answered to patient or proxy's satisfaction: yes     Relevant documents present and verified: yes     Test results available and properly labeled: yes     Imaging studies available: yes     Required blood products,  implants, devices, and special equipment available: yes     Site/side marked: yes     Immediately prior to procedure, a time out was called: yes     Patient identity confirmed:  Verbally with patient Location:    Location:  Wrist   Wrist:  R radiocarpal Anesthesia (see MAR for exact dosages):    Anesthesia method:  Local infiltration   Local anesthetic:  Lidocaine 2% w/o epi Procedure details:    Preparation: Patient was prepped and draped in usual sterile fashion     Needle gauge:  79 G   Ultrasound guidance: no     Approach:  Anterior   Aspirate amount:  0.4m   Aspirate characteristics:  Blood-tinged   Steroid injected: no     Specimen collected: yes   Post-procedure details:    Dressing:  Adhesive bandage   Patient tolerance of procedure:  Tolerated well, no immediate complications    (including critical care time)  Medications Ordered in ED Medications  lidocaine (XYLOCAINE) 2 % injection 10 mL (not administered)     Initial Impression / Assessment and Plan / ED Course  I have reviewed the triage vital signs and the nursing notes.  Pertinent labs & imaging results that were available during my care of the patient were reviewed by me and considered in my medical decision making (see chart for details).     Patient presented to the emergency department with complaints of pain and swelling of the right wrist. Patient reports that he might have injured the area but was somewhat vague about this. It was not clear that there was any direct trauma to the wrist region. Septic joint and gout  were considered in the differential diagnosis. Septic joint felt to be much less likely, patient reports symptoms occurring over several days, no fevers, no significant erythema of the joint. Blood work including sedimentation rate and C-reactive protein pending. Joint was aspirated, small amount of synovial fluid obtained and sent for crystals and culture.  WBC is 5.5 today. Only a very  small amount of fluid was obtained from the joint space, no crystals were seen. Visualized fluid was clear, yellow, non-cloudy. There are no crystals seen.  Reviewing her records reveals patient has had previous presentations with similar swelling of the other wrist that was treated as gouty arthritis. Suspect that this is an inflammatory response and noninfectious place on available data examination. Will await fluid culture. ESR, CRP pending as well.  Patient will be treated with analgesia and can follow-up with dialysis today. He will need to contact his primary doctor to be seen tomorrow and also can follow-up with orthopedics.  Discussed with Dr. Aline Brochure, on call for orthopedics. Based on the description of his wrist and his lab work, he agrees that infection is unlikely. Does not recommend any antibiotic coverage at that time either orally or with dialysis. Recommend splinting, pain control and he will see the patient in the office for follow-up. Patient was, however, given strict return precautions for any infectious symptoms and he did express understanding.  Final Clinical Impressions(s) / ED Diagnoses   Final diagnoses:  Pain  Arthralgia of right wrist    New Prescriptions New Prescriptions   No medications on file     Orpah Greek, MD 11/13/16 [place], MD 11/13/16 [phone]

## 2016-11-13 NOTE — ED Triage Notes (Signed)
Patient states that his hand has been swelling.  States that a tech at dialysis was placing a glove on his right hand and bent his right 5th finger backwards and his hand has been bothering his since then.  States that his right hand has been swelling since Sunday.

## 2016-11-16 LAB — BODY FLUID CULTURE: Culture: NO GROWTH

## 2016-12-19 ENCOUNTER — Telehealth: Payer: Self-pay | Admitting: Orthopedic Surgery

## 2016-12-19 NOTE — Telephone Encounter (Signed)
Patient and wife, Ms. Randy [name], called to relay that patient's right leg (stump) is cold; states this has been occurring for approximately 3 days.  Inquired about an appointment today, 12/19/16 -- I relayed that Dr Aline Brochure is in surgery today and that Dr Luna Glasgow is finishing clinic at this time.  In addition, patient, per our practice administrator, Abran Cantor, advises that patient go directly to [facility] Emergency department to be evaluated, and will then be referred appropriately from there.  Patient and wife voice understanding, and said will go on to [facility] Emergency room.

## 2016-12-22 ENCOUNTER — Encounter (HOSPITAL_COMMUNITY): Payer: Self-pay

## 2016-12-22 ENCOUNTER — Emergency Department (HOSPITAL_COMMUNITY): Payer: Medicare Other

## 2016-12-22 ENCOUNTER — Inpatient Hospital Stay (HOSPITAL_COMMUNITY)
Admission: EM | Admit: 2016-12-22 | Discharge: 2016-12-24 | DRG: 564 | Disposition: A | Discharge status: Home / Self Care | Payer: Medicare Other | Attending: Internal Medicine | Admitting: Internal Medicine

## 2016-12-22 DIAGNOSIS — Z800 Family history of malignant neoplasm of digestive organs: Secondary | ICD-10-CM

## 2016-12-22 DIAGNOSIS — Z87891 Personal history of nicotine dependence: Secondary | ICD-10-CM

## 2016-12-22 DIAGNOSIS — Z992 Dependence on renal dialysis: Secondary | ICD-10-CM

## 2016-12-22 DIAGNOSIS — N186 End stage renal disease: Secondary | ICD-10-CM | POA: Diagnosis present

## 2016-12-22 DIAGNOSIS — L03115 Cellulitis of right lower limb: Secondary | ICD-10-CM | POA: Diagnosis present

## 2016-12-22 DIAGNOSIS — Z823 Family history of stroke: Secondary | ICD-10-CM

## 2016-12-22 DIAGNOSIS — I482 Chronic atrial fibrillation: Secondary | ICD-10-CM | POA: Diagnosis present

## 2016-12-22 DIAGNOSIS — Z8701 Personal history of pneumonia (recurrent): Secondary | ICD-10-CM

## 2016-12-22 DIAGNOSIS — M869 Osteomyelitis, unspecified: Secondary | ICD-10-CM | POA: Diagnosis present

## 2016-12-22 DIAGNOSIS — Z89511 Acquired absence of right leg below knee: Secondary | ICD-10-CM

## 2016-12-22 DIAGNOSIS — T8740 Infection of amputation stump, unspecified extremity: Secondary | ICD-10-CM | POA: Diagnosis present

## 2016-12-22 DIAGNOSIS — E7800 Pure hypercholesterolemia, unspecified: Secondary | ICD-10-CM | POA: Diagnosis present

## 2016-12-22 DIAGNOSIS — M898X9 Other specified disorders of bone, unspecified site: Secondary | ICD-10-CM | POA: Diagnosis present

## 2016-12-22 DIAGNOSIS — M79604 Pain in right leg: Secondary | ICD-10-CM | POA: Diagnosis present

## 2016-12-22 DIAGNOSIS — I10 Essential (primary) hypertension: Secondary | ICD-10-CM | POA: Diagnosis present

## 2016-12-22 DIAGNOSIS — I428 Other cardiomyopathies: Secondary | ICD-10-CM | POA: Diagnosis present

## 2016-12-22 DIAGNOSIS — T8789 Other complications of amputation stump: Secondary | ICD-10-CM

## 2016-12-22 DIAGNOSIS — N2581 Secondary hyperparathyroidism of renal origin: Secondary | ICD-10-CM | POA: Diagnosis present

## 2016-12-22 DIAGNOSIS — Z8673 Personal history of transient ischemic attack (TIA), and cerebral infarction without residual deficits: Secondary | ICD-10-CM

## 2016-12-22 DIAGNOSIS — T8743 Infection of amputation stump, right lower extremity: Secondary | ICD-10-CM | POA: Diagnosis not present

## 2016-12-22 DIAGNOSIS — Z89612 Acquired absence of left leg above knee: Secondary | ICD-10-CM

## 2016-12-22 DIAGNOSIS — I2510 Atherosclerotic heart disease of native coronary artery without angina pectoris: Secondary | ICD-10-CM | POA: Diagnosis present

## 2016-12-22 DIAGNOSIS — I132 Hypertensive heart and chronic kidney disease with heart failure and with stage 5 chronic kidney disease, or end stage renal disease: Secondary | ICD-10-CM | POA: Diagnosis present

## 2016-12-22 DIAGNOSIS — D631 Anemia in chronic kidney disease: Secondary | ICD-10-CM | POA: Diagnosis present

## 2016-12-22 DIAGNOSIS — Y835 Amputation of limb(s) as the cause of abnormal reaction of the patient, or of later complication, without mention of misadventure at the time of the procedure: Secondary | ICD-10-CM | POA: Diagnosis present

## 2016-12-22 DIAGNOSIS — I5042 Chronic combined systolic (congestive) and diastolic (congestive) heart failure: Secondary | ICD-10-CM | POA: Diagnosis present

## 2016-12-22 DIAGNOSIS — M25552 Pain in left hip: Secondary | ICD-10-CM | POA: Diagnosis present

## 2016-12-22 DIAGNOSIS — I739 Peripheral vascular disease, unspecified: Secondary | ICD-10-CM | POA: Diagnosis present

## 2016-12-22 LAB — PROTIME-INR
INR: 1.05
Prothrombin Time: 13.8 seconds (ref 11.4–15.2)

## 2016-12-22 LAB — CBC WITH DIFFERENTIAL/PLATELET
Basophils Absolute: 0 10*3/uL (ref 0.0–0.1)
Basophils Relative: 0 %
EOS ABS: 0.1 10*3/uL (ref 0.0–0.7)
EOS PCT: 2 %
HCT: 38.7 % — ABNORMAL LOW (ref 39.0–52.0)
Hemoglobin: 11.4 g/dL — ABNORMAL LOW (ref 13.0–17.0)
Lymphocytes Relative: 16 %
Lymphs Abs: 0.7 10*3/uL (ref 0.7–4.0)
MCH: 28.6 pg (ref 26.0–34.0)
MCHC: 29.5 g/dL — AB (ref 30.0–36.0)
MCV: 97.2 fL (ref 78.0–100.0)
MONO ABS: 0.6 10*3/uL (ref 0.1–1.0)
MONOS PCT: 12 %
Neutro Abs: 3.2 10*3/uL (ref 1.7–7.7)
Neutrophils Relative %: 70 %
Platelets: 187 10*3/uL (ref 150–400)
RBC: 3.98 MIL/uL — ABNORMAL LOW (ref 4.22–5.81)
RDW: 19.9 % — AB (ref 11.5–15.5)
WBC: 4.6 10*3/uL (ref 4.0–10.5)

## 2016-12-22 LAB — I-STAT CG4 LACTIC ACID, ED: Lactic Acid, Venous: 0.97 mmol/L (ref 0.5–1.9)

## 2016-12-22 LAB — COMPREHENSIVE METABOLIC PANEL
ALT: 11 U/L — ABNORMAL LOW (ref 17–63)
ANION GAP: 13 (ref 5–15)
AST: 14 U/L — ABNORMAL LOW (ref 15–41)
Albumin: 3 g/dL — ABNORMAL LOW (ref 3.5–5.0)
Alkaline Phosphatase: 74 U/L (ref 38–126)
BILIRUBIN TOTAL: 0.5 mg/dL (ref 0.3–1.2)
BUN: 53 mg/dL — ABNORMAL HIGH (ref 6–20)
CO2: 26 mmol/L (ref 22–32)
Calcium: 7.9 mg/dL — ABNORMAL LOW (ref 8.9–10.3)
Chloride: 97 mmol/L — ABNORMAL LOW (ref 101–111)
Creatinine, Ser: 8.78 mg/dL — ABNORMAL HIGH (ref 0.61–1.24)
GFR calc Af Amer: 6 mL/min — ABNORMAL LOW (ref >60)
GFR calc non Af Amer: 5 mL/min — ABNORMAL LOW (ref >60)
GLUCOSE: 138 mg/dL — AB (ref 65–99)
POTASSIUM: 4.5 mmol/L (ref 3.5–5.1)
SODIUM: 136 mmol/L (ref 135–145)
TOTAL PROTEIN: 6.1 g/dL — AB (ref 6.5–8.1)

## 2016-12-22 MED ORDER — MORPHINE SULFATE (PF) 4 MG/ML IV SOLN
4.0000 mg | Freq: Once | INTRAVENOUS | Status: AC
  Administered 2016-12-22: 4 mg via INTRAVENOUS
  Filled 2016-12-22: qty 1

## 2016-12-22 MED ORDER — PIPERACILLIN-TAZOBACTAM 3.375 G IVPB 30 MIN
3.3750 g | Freq: Once | INTRAVENOUS | Status: AC
  Administered 2016-12-22: 3.375 g via INTRAVENOUS
  Filled 2016-12-22: qty 50

## 2016-12-22 MED ORDER — PIPERACILLIN-TAZOBACTAM 3.375 G IVPB
3.3750 g | Freq: Two times a day (BID) | INTRAVENOUS | Status: DC
  Administered 2016-12-23 – 2016-12-24 (×3): 3.375 g via INTRAVENOUS
  Filled 2016-12-22 (×3): qty 50

## 2016-12-22 MED ORDER — VANCOMYCIN HCL IN DEXTROSE 1-5 GM/200ML-% IV SOLN
1000.0000 mg | Freq: Once | INTRAVENOUS | Status: AC
  Administered 2016-12-22: 1000 mg via INTRAVENOUS
  Filled 2016-12-22: qty 200

## 2016-12-22 NOTE — ED Notes (Signed)
Patient transported to X-ray 

## 2016-12-22 NOTE — ED Triage Notes (Signed)
Patient complains of 2 weeks of swelling to right stump, denies any trauma and does not wear prosthetic. Also complains of left hip pain with with movement. States that he is suppose to be scheduled for appointment at wound clinic in future. No fever, NAD

## 2016-12-22 NOTE — ED Provider Notes (Signed)
Please see previous providers note for full H&P.  Patient with ulceration to right stump, also with left hip pain.  Patient pending MRI for deep space infection.  Labs are reassuring with normal lactic acid, no elevation in WBC, afebrile.    Patient's MRI concerning for osteomyelitis.  Discussed with vascular, patient will need hospital admission IV antibiotics with vascular consult tomorrow for ongoing management.  This discussed with patient and family at bedside who agreed to this.  [facility] hospitalist consulted and agreed for admission.  Vitals:   12/22/16 2000 12/22/16 2015  BP: 138/68 (!) 156/68  Pulse: 65 66  Resp: 17 13  Temp:       Okey Regal, PA-C 12/22/16 2056    Gwenyth Allegra Tegeler, MD 12/23/16 1031

## 2016-12-22 NOTE — ED Provider Notes (Signed)
[facility] DEPT Provider Note   CSN: [id number] Arrival date & time: 12/22/16  1030     History   Chief Complaint No chief complaint on file.   HPI Randy [name] is a 83 y.o. male with a past medical history significant for ESRD on dialysis M/W/F, CAD, hypertension, hypercholesterolemia, and peripheral vascular disease status post right below-the-knee amputation and left above-the-knee amputation who presents with stump erythema, swelling, pain, and warmth and left hip pain. Patient denies any trauma. Patient says that over the last 2 weeks, he has had pain that is been worsening in the right stump. The patient and wife say that there was an ulcer that arose and was draining some puslike fluid that was pink and yellow. He reports some erythema, swelling, and tenderness that is distal from the knee. He said that this is new and never happened before. He called his doctor who scheduled an appointment for him to see the wound care team in the next few weeks but said that due to worsening pain and symptoms, he presents for evaluation. He also said that over the last few days, he has developed pain in his left hip. He says it is painful to touch or move it. He denies any traumas. He denies any skin changes or erythema over the site. He denies any changes with bowel movements. He reports that he does make a very small amount of urine. He denies nausea, vomiting, fevers, chills, chest pain, shortness of breath. He describes his pain in the right stump and left hip as 10 out of 10 in severity. He said that he tried taking some hydrocodone without relief. He reports that his doctor at dialysis was concerned and told him to come to the ED.    The history is provided by the patient, a relative and medical records. No language interpreter was used.  Leg Pain   This is a new problem. The current episode started more than 1 week ago. The problem occurs constantly. The problem has been gradually worsening.  The pain is present in the right knee and left hip. The quality of the pain is described as aching and sharp. The pain is at a severity of 10/10. The pain is severe. Pertinent negatives include no numbness and no stiffness. The symptoms are aggravated by contact. He has tried nothing for the symptoms. The treatment provided no relief. There has been no history of extremity trauma.    Past Medical History:  Diagnosis Date  . Anemia of chronic disease   . Arthritis   . CAD (coronary artery disease)    Moderate multivessel disease 07/2015 - managed medically  . ESRD on hemodialysis [facility])    M/W/F in [place] - Dr. Florene Glen  . Essential hypertension   . Gout   . History of blood transfusion   . History of myopericarditis 2015   07/2015  . History of pneumonia 2014  . History of stroke   . Hypercholesterolemia   . Nonischemic cardiomyopathy (HCC)    LVEF 35-40%  . Peripheral vascular disease ([place])    Status post right below knee amputation for a nonhealing wound of the right foot 12/2014  . Pneumonia     Patient Active Problem List   Diagnosis Date Noted  . Atrial fibrillation ([place]) 08/19/2016  . Hypertriglyceridemia   . Hypokalemia   . Hypomagnesemia   . Gastrointestinal hemorrhage associated with anorectal source   . CAD in native artery   . Aortic stenosis   .  Coronary artery disease involving native coronary artery of native heart with angina pectoris ([place])   . Hyperlipidemia   . Cardiomyopathy, ischemic   . GI bleed 03/03/2016  . Hyperglycemia 03/03/2016  . GI bleeding 03/03/2016  . Elevated troponin   . Hypertensive urgency 11/09/2015  . Hypertensive urgency, malignant 11/08/2015  . Malnutrition of moderate degree 11/08/2015  . Adjustment disorder with depressed mood   . Constipation due to pain medication   . Slow transit constipation   . Unilateral complete BKA ([place]) 10/23/2015  . Labile blood pressure   . Muscle stiffness   . S/P AKA (above knee amputation)  unilateral ([place])   . History of right above knee amputation ([place])   . Benign essential HTN   . Unilateral AKA ([place]) 10/12/2015  . Abnormality of gait   . Status post above knee amputation of left lower extremity ([place])   . Chronic combined systolic and diastolic congestive heart failure ([place])   . ESRD on dialysis ([place])   . Essential hypertension   . Post-operative pain   . Peripheral vascular disease with pain at rest [facility]) 10/10/2015  . Chest pain 08/04/2015  . PD catheter dysfunction ([place]) 03/13/2015  . Cellulitis and abscess of foot   . Kidney mass   . Other emphysema ([place])   . Symptomatic anemia   . Physical deconditioning   . S/P BKA (below knee amputation) unilateral ([place])   . Renal mass 01/01/2015  . Foot pain 12/30/2014  . Foot pain, right 12/30/2014  . CHF (congestive heart failure) ([place]) 12/30/2014  . CAD (coronary artery disease) 12/30/2014  . Hyperkalemia 12/30/2014  . ESRD (end stage renal disease) on dialysis ([place])   . Blood loss anemia   . Acute upper GI bleed 09/21/2014  . Fluid overload 08/29/2014  . Acute on chronic combined systolic and diastolic CHF (congestive heart failure) ([place]) 08/29/2014  . Acute respiratory failure with hypoxia ([place]) 08/29/2014  . Acute respiratory failure with hypoxemia ([place]) 08/29/2014  . Acute myopericarditis 08/18/2014  . Anemia of chronic disease   . End stage renal disease, has been peritoneal, now hemodialysis 07/28/2012  . Secondary cardiomyopathy ([place]) 06/26/2012  . HLD (hyperlipidemia) 12/10/2006  . Hypertensive heart disease 08/18/2006  . OSTEOARTHRITIS 08/18/2006  . Polycystic kidney 08/18/2006    Past Surgical History:  Procedure Laterality Date  . ABOVE KNEE LEG AMPUTATION Left 10/10/2015  . AMPUTATION Right 01/05/2015   BKA  . AMPUTATION Left 10/10/2015   Procedure: AMPUTATION ABOVE KNEE LEFT;  Surgeon: Angelia Mould, MD;  Location: [facility];  Service: Vascular;  Laterality: Left;  . AV FISTULA PLACEMENT   08/24/2012   Procedure: ARTERIOVENOUS (AV) FISTULA CREATION;  Surgeon: Rosetta Posner, MD;  Location: [facility];  Service: Vascular;  Laterality: Left;  . BACK SURGERY    . CAPD REMOVAL N/A 03/13/2015   Procedure: CONTINUOUS AMBULATORY PERITONEAL DIALYSIS  (CAPD) CATHETER REMOVAL;  Surgeon: Coralie Keens, MD;  Location: [facility];  Service: General;  Laterality: N/A;  . CARDIAC CATHETERIZATION    . CHOLECYSTECTOMY    . COLONOSCOPY     remote past by Dr. Tamala Julian in [place], [place]   . COLONOSCOPY Left 09/26/2014   Dr. Paulita Fujita: old and semi-fresh blood scattered throughout the colon, several medium-sized diverticula, no obvious focal bleeding site, no blood in TI, several polyps seen in cecum, ascending colon and proximal transverse s/p biopsy (tubular adenomas)  . COLONOSCOPY N/A 03/05/2016   Dr. Oneida Alar: old blood in ileum, few fresh clots  ascending colon, frequent large mouth diverticula and old clots in descending/sigmoid. 8 polyps in ascending colon not manipulated  . ESOPHAGOGASTRODUODENOSCOPY N/A 03/05/2016   Dr. Oneida Alar: hiatal hernia, mild gastritis   . GANGLION CYST EXCISION  01/03/2012   Procedure: REMOVAL GANGLION OF WRIST;  Surgeon: Scherry Ran, MD;  Location: [facility];  Service: General;  Laterality: Right;  . GIVENS CAPSULE STUDY N/A 03/07/2016   [place]: dark material in ileum, with fresh blood in right colon   . HEMORRHOID SURGERY  1970's  . INSERTION OF DIALYSIS CATHETER Right     neck  . INSERTION OF DIALYSIS CATHETER  10/19/2012   Procedure: INSERTION OF DIALYSIS CATHETER;  Surgeon: Rosetta Posner, MD;  Location: [facility];  Service: Vascular;  Laterality: N/A;  [place] ARTHROSCOPY Right 2007  . LEFT HEART CATHETERIZATION WITH CORONARY ANGIOGRAM N/A 08/17/2014   Procedure: LEFT HEART CATHETERIZATION WITH CORONARY ANGIOGRAM;  Surgeon: Larey Dresser, MD;  Location: [facility] C[facility] LAB;  Service: Cardiovascular;  Laterality: N/A;  . [place] SURGERY  2004       Home  Medications    Prior to Admission medications   Medication Sig Start Date End Date Taking? Authorizing Provider  amLODipine (NORVASC) 5 MG tablet Take 5 mg by mouth daily.    Historical Provider, MD  atorvastatin (LIPITOR) 80 MG tablet Take 1 tablet (80 mg total) by mouth daily. 11/02/15   Arnoldo Lenis, MD  brimonidine (ALPHAGAN) 0.2 % ophthalmic solution Place 1 drop into both eyes 2 (two) times daily.    Historical Provider, MD  docusate sodium (COLACE) 100 MG capsule Take 2 capsules (200 mg total) by mouth daily. Patient taking differently: Take 200 mg by mouth daily as needed for mild constipation or moderate constipation.  10/31/15   Bary Leriche, PA-C  feeding supplement (BOOST / RESOURCE BREEZE) LIQD Take 1 Container by mouth 4 (four) times daily -  with meals and at bedtime. 03/14/16   Lavina Hamman, MD  gabapentin (NEURONTIN) 300 MG capsule Take 300 mg by mouth at bedtime.    Historical Provider, MD  hydrALAZINE (APRESOLINE) 50 MG tablet Take 50 mg by mouth 2 (two) times daily.    Historical Provider, MD  HYDROcodone-acetaminophen (NORCO/VICODIN) 5-325 MG tablet Take 1 tablet by mouth every 4 (four) hours as needed. 11/13/16   Orpah Greek, MD  isosorbide mononitrate (IMDUR) 30 MG 24 hr tablet TAKE 1/2 TABLET BY MOUTH AT BEDTIME. 07/10/16   Arnoldo Lenis, MD  lidocaine-prilocaine (EMLA) cream Apply 1 application topically every Monday, Wednesday, and Friday. For dialysis 10/06/15   Historical Provider, MD  lisinopril (PRINIVIL,ZESTRIL) 40 MG tablet Take 40 mg by mouth daily.    Historical Provider, MD  nitroGLYCERIN (NITROSTAT) 0.4 MG SL tablet Place 1 tablet (0.4 mg total) under the tongue every 5 (five) minutes as needed for chest pain. 12/20/15   Ripudeep Krystal Eaton, MD  pantoprazole (PROTONIX) 40 MG tablet Take 1 tablet (40 mg total) by mouth daily at 12 noon. 10/23/15   Barton Dubois, MD  sevelamer carbonate (RENVELA) 800 MG tablet Take 1,600-2,400 mg by mouth See admin  instructions. Take 3 tablets (2400 mg) with meals and 2 tablets (1600 mg) with snacks    Historical Provider, MD  timolol (BETIMOL) 0.5 % ophthalmic solution Place 1 drop into both eyes 2 (two) times daily.    Historical Provider, MD    Family History Family History  Problem Relation Age of  Onset  . Stroke Mother   . Arthritis    . Cancer    . Kidney disease    . Cancer Sister   . Colon cancer Brother     unclear   . Colon cancer Brother   . Anesthesia problems Neg Hx   . Hypotension Neg Hx   . Malignant hyperthermia Neg Hx   . Pseudochol deficiency Neg Hx     Social History Social History  Substance Use Topics  . Smoking status: Former Smoker    Packs/day: 1.00    Years: 30.00    Types: Cigarettes    Quit date: 08/19/1998  . Smokeless tobacco: Former Systems developer    Types: Chew    Quit date: 12/31/1993  . Alcohol use No     Allergies   Patient has no known allergies.   Review of Systems Review of Systems  Constitutional: Positive for chills. Negative for activity change, diaphoresis, fatigue and fever.  HENT: Negative for congestion and rhinorrhea.   Eyes: Negative for visual disturbance.  Respiratory: Negative for cough, chest tightness, shortness of breath, wheezing and stridor.   Cardiovascular: Negative for chest pain, palpitations and leg swelling.  Gastrointestinal: Negative for abdominal distention, abdominal pain, blood in stool, constipation, diarrhea, nausea and vomiting.  Genitourinary: Negative for dysuria, flank pain and frequency.  Musculoskeletal: Negative for back pain, gait problem, neck pain, neck stiffness and stiffness.  Skin: Positive for rash and wound.  Neurological: Negative for dizziness, weakness, light-headedness, numbness and headaches.  Psychiatric/Behavioral: Negative for agitation and confusion.  All other systems reviewed and are negative.    Physical Exam Updated Vital Signs BP 121/73 (BP Location: Right Arm)   Pulse 70   Temp 98  F (36.7 C) (Oral)   Resp 18   SpO2 97%   Physical Exam  Constitutional: He is oriented to person, place, and time. He appears well-developed and well-nourished. No distress.  HENT:  Head: Normocephalic and atraumatic.  Right Ear: External ear normal.  Left Ear: External ear normal.  Nose: Nose normal.  Mouth/Throat: Oropharynx is clear and moist. No oropharyngeal exudate.  Eyes: Conjunctivae and EOM are normal. Pupils are equal, round, and reactive to light.  Neck: Normal range of motion. Neck supple.  Cardiovascular: Normal rate, normal heart sounds and intact distal pulses.  Exam reveals no friction rub.   No murmur heard. Pulmonary/Chest: Effort normal and breath sounds normal. No stridor. No respiratory distress. He has no wheezes. He exhibits no tenderness.  Abdominal: Soft. There is no tenderness. There is no rebound and no guarding.  Musculoskeletal: He exhibits edema and tenderness.       Left hip: He exhibits tenderness.       Right knee: He exhibits swelling and erythema. Tenderness found.       Legs: L AKA, R BKA  Neurological: He is alert and oriented to person, place, and time. No cranial nerve deficit or sensory deficit. He exhibits normal muscle tone. Coordination normal.  Skin: Skin is warm. Capillary refill takes less than 2 seconds. No rash noted. He is not diaphoretic. No erythema.  Psychiatric: He has a normal mood and affect.     ED Treatments / Results  Labs (all labs ordered are listed, but only abnormal results are displayed) Labs Reviewed  CBC WITH DIFFERENTIAL/PLATELET - Abnormal; Notable for the following:       Result Value   RBC 3.98 (*)    Hemoglobin 11.4 (*)    HCT 38.7 (*)  MCHC 29.5 (*)    RDW 19.9 (*)    All other components within normal limits  COMPREHENSIVE METABOLIC PANEL - Abnormal; Notable for the following:    Chloride 97 (*)    Glucose, Bld 138 (*)    BUN 53 (*)    Creatinine, Ser 8.78 (*)    Calcium 7.9 (*)    Total Protein  6.1 (*)    Albumin 3.0 (*)    AST 14 (*)    ALT 11 (*)    GFR calc non Af Amer 5 (*)    GFR calc Af Amer 6 (*)    All other components within normal limits  BASIC METABOLIC PANEL - Abnormal; Notable for the following:    Chloride 96 (*)    Glucose, Bld 120 (*)    BUN 62 (*)    Creatinine, Ser 9.75 (*)    Calcium 7.7 (*)    GFR calc non Af Amer 4 (*)    GFR calc Af Amer 5 (*)    Anion gap 16 (*)    All other components within normal limits  CBC - Abnormal; Notable for the following:    RBC 3.71 (*)    Hemoglobin 10.7 (*)    HCT 35.9 (*)    MCHC 29.8 (*)    RDW 19.6 (*)    All other components within normal limits  MRSA PCR SCREENING  PROTIME-INR  I-STAT CG4 LACTIC ACID, ED    EKG  EKG Interpretation None       Radiology Mr Knee Right Wo Contrast  Result Date: 12/22/2016 CLINICAL DATA:  Pain and swelling of the amputation stump of the right lower extremity x2 weeks. No fever. EXAM: MRI OF THE RIGHT KNEE WITHOUT CONTRAST TECHNIQUE: Multiplanar, multisequence MR imaging of the knee was performed. No intravenous contrast was administered. COMPARISON:  Plain radiographs from 12/22/2016 FINDINGS: Susceptibility artifact is noted emanating from the posteromedial aspect of the stump. This slightly limits assessment. MENISCI Medial meniscus: Limited assessment of the posterior horn due to susceptibility artifacts adjacent to the posterior horn. No frank tear identified. Lateral meniscus:  Intact LIGAMENTS Cruciates:  Intact Collaterals: Medial collateral ligament is intact. Lateral collateral ligament complex is intact. CARTILAGE Patellofemoral: Mild chondromalacia of the lateral patellar cartilage with slight thinning of the trochlear cartilage suggested. Medial:  Irregular femorotibial partial-thickness cartilage loss. Lateral:  No chondral defect Joint:  No joint effusion. Normal Hoffa's fat. No plical thickening. Popliteal Fossa: Limited by susceptibility artifacts. No definite  popliteal cyst identified though this region is obscured by artifacts. Extensor Mechanism:  Intact quadriceps tendon and patellar tendon. Bones: Faint marrow signal abnormalities along the surgical margin of the tibial shaft amputation. Changes of early osteomyelitis of concern. Other: Extensive stump edema of the surrounding soft tissues.No focal abscess or draining sinuses. IMPRESSION: 1. Minimal marrow signal abnormality noted at the tibial site of amputation. This in conjunction with surrounding cellulitis and edema raises the possibility of early osteomyelitis of the tibial stump. 2. No acute meniscal, cruciate or collateral ligament abnormality. The posterior horn of the medial meniscus is obscured by susceptibility artifacts. Electronically Signed   By: Ashley Royalty M.D.   On: 12/22/2016 19:15   Mr Hip Left Wo Contrast  Result Date: 12/22/2016 CLINICAL DATA:  Two weeks of swelling.  No known trauma. EXAM: MR OF THE LEFT HIP WITHOUT CONTRAST TECHNIQUE: Multiplanar, multisequence MR imaging was performed. No intravenous contrast was administered. COMPARISON:  None. FINDINGS: Bones: No hip fracture, dislocation  or avascular necrosis. No periosteal reaction or bone destruction. No aggressive osseous lesion. Normal sacrum and sacroiliac joints. No SI joint widening or erosive changes. Degenerative disc disease with disc height loss at L4-5 and L5-S1. Articular cartilage and labrum Articular cartilage: High-grade partial-thickness cartilage loss of of the left hip. Partial-thickness cartilage loss of the right hip. Labrum: Grossly intact, but evaluation is limited by lack of intraarticular fluid. Joint or bursal effusion Joint effusion: Small right hip joint effusion. Small left hip joint effusion. No SI joint effusion. Bursae:  No bursa formation. Muscles and tendons Flexors: Mild muscle edema in the iliacus muscle bilaterally. Extensors:  Mild muscle edema in the right vastus lateralis muscle. Abductors:  Normal. Adductors: Mild muscle edema in the right adductor magnus muscle. Gluteals: Muscle edema in the left superior gluteus maximus muscle. Hamstrings: Normal. Other findings Miscellaneous: No pelvic free fluid. No fluid collection or hematoma. No inguinal lymphadenopathy. No inguinal hernia. IMPRESSION: 1. No hip fracture, dislocation or avascular necrosis. 2. Moderate osteoarthritis of the left hip. Mild osteoarthritis of the right hip. 3. Mild muscle strain involving the the right vastus lateralis muscle, right adductor magnus muscle and left superior gluteus maximus muscle. Electronically Signed   By: Kathreen Devoid   On: 12/22/2016 17:39   Dg Knee Complete 4 Views Right  Result Date: 12/22/2016 CLINICAL DATA:  Patient complains of 2 weeks of swelling to right stump, denies any trauma and does not wear prosthetic. Also complains of left hip pain with with movement. States that he is suppose to be scheduled for appointment at wound clinic in future. No fever, NAD. Noticeable green circular scab on bottom of right stump. EXAM: RIGHT KNEE - COMPLETE 4+ VIEW COMPARISON:  None. FINDINGS: Status post below-the-knee amputation. The amputated margins of the remaining tibia and fibula are well-defined. There is no bone resorption to suggest osteomyelitis. No fracture. Bones are demineralized. There is mild medial and lateral femorotibial joint space compartment narrowing. No knee joint effusion. Dense vascular calcifications are noted along the distal superficial femoral artery and popliteal artery. Soft tissue edema is seen overlying the stump. No soft tissue air. A small metallic foreign body projects within the posterior subcutaneous soft tissues at the level of the knee. IMPRESSION: 1. No fracture.  No evidence of osteomyelitis. 2. Soft tissue swelling overlying the amputated distal tibia and fibula. Electronically Signed   By: Lajean Manes M.D.   On: 12/22/2016 13:19   Dg Hip Unilat With Pelvis 2-3 Views  Left  Result Date: 12/22/2016 CLINICAL DATA:  Patient complains of 2 weeks of swelling to right stump, denies any trauma and does not wear prosthetic. Also complains of left hip pain with with movement. States that he is suppose to be scheduled for appointment at wound clinic in future. No fever, NAD. Noticeable green circular scab on bottom of right stump. EXAM: DG HIP (WITH OR WITHOUT PELVIS) 2-3V LEFT COMPARISON:  None. FINDINGS: No fracture.  No bone lesion. The hip joint is normally aligned. There is mild concentric hip joint space narrowing. Small marginal osteophytes project from the inferior femoral head. Bones are demineralized. There is dense calcification along the iliac and femoral vessels. IMPRESSION: 1. No fracture or bone lesion. 2. Mild hip joint arthropathic change. Electronically Signed   By: Lajean Manes M.D.   On: 12/22/2016 13:21    Procedures Procedures (including critical care time)  Medications Ordered in ED Medications  piperacillin-tazobactam (ZOSYN) IVPB 3.375 g (3.375 g Intravenous Given 12/23/16 8527)  lisinopril (PRINIVIL,ZESTRIL) tablet 40 mg (not administered)  HYDROcodone-acetaminophen (NORCO/VICODIN) 5-325 MG per tablet 1 tablet (1 tablet Oral Given 12/23/16 0057)  amLODipine (NORVASC) tablet 5 mg (not administered)  brimonidine (ALPHAGAN) 0.2 % ophthalmic solution 1 drop (1 drop Both Eyes Given 12/23/16 0856)  gabapentin (NEURONTIN) capsule 300 mg (300 mg Oral Given 12/23/16 0049)  timolol (TIMOPTIC) 0.5 % ophthalmic solution 1 drop (1 drop Both Eyes Given 12/23/16 0856)  isosorbide mononitrate (IMDUR) 24 hr tablet 15 mg (15 mg Oral Given 12/23/16 0050)  feeding supplement (BOOST / RESOURCE BREEZE) liquid 1 Container (not administered)  nitroGLYCERIN (NITROSTAT) SL tablet 0.4 mg (not administered)  atorvastatin (LIPITOR) tablet 80 mg (not administered)  pantoprazole (PROTONIX) EC tablet 40 mg (not administered)  hydrALAZINE (APRESOLINE) injection 10 mg (not administered)   acetaminophen (TYLENOL) tablet 650 mg (650 mg Oral Given 12/23/16 0056)    Or  acetaminophen (TYLENOL) suppository 650 mg ( Rectal See Alternative 12/23/16 0056)  ondansetron (ZOFRAN) tablet 4 mg (not administered)    Or  ondansetron (ZOFRAN) injection 4 mg (not administered)  heparin injection 5,000 Units (5,000 Units Subcutaneous Given 12/23/16 0647)  sevelamer carbonate (RENVELA) tablet 2,400 mg (2,400 mg Oral Given 12/23/16 0855)  sevelamer carbonate (RENVELA) tablet 1,600 mg (not administered)  ferric gluconate (NULECIT) 125 mg in sodium chloride 0.9 % 100 mL IVPB (not administered)  calcitRIOL (ROCALTROL) capsule 3 mcg (not administered)  vancomycin (VANCOCIN) IVPB 750 mg/150 ml premix (not administered)  morphine 4 MG/ML injection 4 mg (4 mg Intravenous Given 12/22/16 1249)  morphine 4 MG/ML injection 4 mg (4 mg Intravenous Given 12/22/16 1802)  vancomycin (VANCOCIN) IVPB 1000 mg/200 mL premix (1,000 mg Intravenous New Bag/Given 12/22/16 2032)  piperacillin-tazobactam (ZOSYN) IVPB 3.375 g (3.375 g Intravenous New Bag/Given 12/22/16 2032)     Initial Impression / Assessment and Plan / ED Course  I have reviewed the triage vital signs and the nursing notes.  Pertinent labs & imaging results that were available during my care of the patient were reviewed by me and considered in my medical decision making (see chart for details).     Randy [name] is a 83 y.o. male with a past medical history significant for ESRD on dialysis M/W/F, CAD, hypertension, hypercholesterolemia, and peripheral vascular disease status post right below-the-knee amputation and left above-the-knee amputation who presents with stump erythema, swelling, pain, and warmth and left hip pain.    history and exam are seen above.  On exam, patient has a swollen right distal below-the-knee amputation stump. There is an ulcer that does not appear draining currently. There is some dried purulence. There is swelling and tenderness  around the site with erythema and warmth. No tenderness in the knee. Left hip is tender to palpation and movement. No skin changes seen. Abdomen and back nontender. Chest nontender and lungs clear.  Based on patient's symptoms, suspect abscess versus cellulitis of the stump. Patient will have screening laboratory testing as well as x-rays to look for osteomyelitis or bony injuries. Anticipate reassessment following workup.  X-ray was obtained showing no evidence of acute osteomyelitis or fracture. There was soft tissue swelling overlying the stump. Hip x-ray showed no fracture or bone lesion. Mild hip joint arthropathic change.  Given his symptoms and concern for infection, radiology was called and recommended MRI to evaluate. Patient does make some urine so a CT scan with contrast was felt to be contraindicated to preserve his kidney function. Soft tissue ultrasound would not reveal evidence of septic arthritis  or to better evaluate for osteomyelitis.  Patient felt better after pain medications while awaiting MRI.  Care transferred to [facility] while awaiting MRI. Antiipate admission if signs of infection are found on imaging. IF No abscess or OSteo, patient may be discahrged with abx.   Care transferred in stable condition.     Final Clinical Impressions(s) / ED Diagnoses   Final diagnoses:  Hip pain, left  Pain of amputation stump of right lower extremity (HCC)  Hip pain, left  Pain of amputation stump of right lower extremity (HCC)    Clinical Impression: 1. Left hip pain   2. Hip pain, left   3. Pain of amputation stump of right lower extremity (HCC)   4. Hip pain, left   5. Pain of amputation stump of right lower extremity ([place])     Disposition: Admit to Hospitalist service    Courtney Paris, MD 12/23/16 1022

## 2016-12-22 NOTE — Progress Notes (Signed)
Pharmacy Antibiotic Note Randy [name] is a 83 y.o. male admitted on 12/22/2016 with osteomyelitis of right tibial stump in setting of ESRD.  Pharmacy has been consulted for Zosyn and vancomycin dosing.  Plan: 1. Zosyn 3.375 grams IV every 12 hours (infused over 4 hours) 2. Vancomycin 1000 mg x 1 now with further doses based on inpatient IHD schedule   Temp (24hrs), Avg:98 F (36.7 C), Min:98 F (36.7 C), Max:98 F (36.7 C)   Recent Labs Lab 12/22/16 1225 12/22/16 1240  WBC 4.6  --   CREATININE 8.78*  --   LATICACIDVEN  --  0.97    CrCl cannot be calculated (Unknown ideal weight.).    No Known Allergies  Antimicrobials this admission: 4/1 Zosyn >>  4/1 vancomycin >>   Thank you for allowing pharmacy to be a part of this patient's care.  Vincenza Hews, PharmD, BCPS 12/22/2016, 8:50 PM

## 2016-12-22 NOTE — ED Notes (Signed)
Pt returns from radiology. 

## 2016-12-23 ENCOUNTER — Encounter (HOSPITAL_COMMUNITY): Payer: Self-pay | Admitting: Internal Medicine

## 2016-12-23 DIAGNOSIS — Z87891 Personal history of nicotine dependence: Secondary | ICD-10-CM | POA: Diagnosis not present

## 2016-12-23 DIAGNOSIS — M869 Osteomyelitis, unspecified: Secondary | ICD-10-CM | POA: Diagnosis present

## 2016-12-23 DIAGNOSIS — I428 Other cardiomyopathies: Secondary | ICD-10-CM | POA: Diagnosis present

## 2016-12-23 DIAGNOSIS — N2581 Secondary hyperparathyroidism of renal origin: Secondary | ICD-10-CM | POA: Diagnosis present

## 2016-12-23 DIAGNOSIS — Z800 Family history of malignant neoplasm of digestive organs: Secondary | ICD-10-CM | POA: Diagnosis not present

## 2016-12-23 DIAGNOSIS — Z89511 Acquired absence of right leg below knee: Secondary | ICD-10-CM | POA: Diagnosis not present

## 2016-12-23 DIAGNOSIS — T8789 Other complications of amputation stump: Secondary | ICD-10-CM | POA: Diagnosis not present

## 2016-12-23 DIAGNOSIS — N186 End stage renal disease: Secondary | ICD-10-CM | POA: Diagnosis not present

## 2016-12-23 DIAGNOSIS — I2510 Atherosclerotic heart disease of native coronary artery without angina pectoris: Secondary | ICD-10-CM | POA: Diagnosis present

## 2016-12-23 DIAGNOSIS — I482 Chronic atrial fibrillation: Secondary | ICD-10-CM | POA: Diagnosis present

## 2016-12-23 DIAGNOSIS — L03115 Cellulitis of right lower limb: Secondary | ICD-10-CM | POA: Diagnosis present

## 2016-12-23 DIAGNOSIS — Z89612 Acquired absence of left leg above knee: Secondary | ICD-10-CM | POA: Diagnosis not present

## 2016-12-23 DIAGNOSIS — I5042 Chronic combined systolic (congestive) and diastolic (congestive) heart failure: Secondary | ICD-10-CM | POA: Diagnosis present

## 2016-12-23 DIAGNOSIS — T8743 Infection of amputation stump, right lower extremity: Secondary | ICD-10-CM | POA: Diagnosis present

## 2016-12-23 DIAGNOSIS — M79604 Pain in right leg: Secondary | ICD-10-CM | POA: Diagnosis present

## 2016-12-23 DIAGNOSIS — T8740 Infection of amputation stump, unspecified extremity: Secondary | ICD-10-CM | POA: Diagnosis present

## 2016-12-23 DIAGNOSIS — I10 Essential (primary) hypertension: Secondary | ICD-10-CM | POA: Diagnosis not present

## 2016-12-23 DIAGNOSIS — Z992 Dependence on renal dialysis: Secondary | ICD-10-CM | POA: Diagnosis not present

## 2016-12-23 DIAGNOSIS — M25552 Pain in left hip: Secondary | ICD-10-CM | POA: Diagnosis present

## 2016-12-23 DIAGNOSIS — I739 Peripheral vascular disease, unspecified: Secondary | ICD-10-CM | POA: Diagnosis present

## 2016-12-23 DIAGNOSIS — Z8701 Personal history of pneumonia (recurrent): Secondary | ICD-10-CM | POA: Diagnosis not present

## 2016-12-23 DIAGNOSIS — D631 Anemia in chronic kidney disease: Secondary | ICD-10-CM | POA: Diagnosis present

## 2016-12-23 DIAGNOSIS — Z823 Family history of stroke: Secondary | ICD-10-CM | POA: Diagnosis not present

## 2016-12-23 DIAGNOSIS — M898X9 Other specified disorders of bone, unspecified site: Secondary | ICD-10-CM | POA: Diagnosis present

## 2016-12-23 DIAGNOSIS — I132 Hypertensive heart and chronic kidney disease with heart failure and with stage 5 chronic kidney disease, or end stage renal disease: Secondary | ICD-10-CM | POA: Diagnosis present

## 2016-12-23 DIAGNOSIS — Z8673 Personal history of transient ischemic attack (TIA), and cerebral infarction without residual deficits: Secondary | ICD-10-CM | POA: Diagnosis not present

## 2016-12-23 DIAGNOSIS — Y835 Amputation of limb(s) as the cause of abnormal reaction of the patient, or of later complication, without mention of misadventure at the time of the procedure: Secondary | ICD-10-CM | POA: Diagnosis present

## 2016-12-23 DIAGNOSIS — E7800 Pure hypercholesterolemia, unspecified: Secondary | ICD-10-CM | POA: Diagnosis present

## 2016-12-23 LAB — CBC
HCT: 35.9 % — ABNORMAL LOW (ref 39.0–52.0)
Hemoglobin: 10.7 g/dL — ABNORMAL LOW (ref 13.0–17.0)
MCH: 28.8 pg (ref 26.0–34.0)
MCHC: 29.8 g/dL — ABNORMAL LOW (ref 30.0–36.0)
MCV: 96.8 fL (ref 78.0–100.0)
PLATELETS: 166 10*3/uL (ref 150–400)
RBC: 3.71 MIL/uL — AB (ref 4.22–5.81)
RDW: 19.6 % — ABNORMAL HIGH (ref 11.5–15.5)
WBC: 5.8 10*3/uL (ref 4.0–10.5)

## 2016-12-23 LAB — BASIC METABOLIC PANEL
Anion gap: 16 — ABNORMAL HIGH (ref 5–15)
BUN: 62 mg/dL — ABNORMAL HIGH (ref 6–20)
CALCIUM: 7.7 mg/dL — AB (ref 8.9–10.3)
CO2: 26 mmol/L (ref 22–32)
CREATININE: 9.75 mg/dL — AB (ref 0.61–1.24)
Chloride: 96 mmol/L — ABNORMAL LOW (ref 101–111)
GFR calc non Af Amer: 4 mL/min — ABNORMAL LOW (ref >60)
GFR, EST AFRICAN AMERICAN: 5 mL/min — AB (ref >60)
Glucose, Bld: 120 mg/dL — ABNORMAL HIGH (ref 65–99)
Potassium: 4.7 mmol/L (ref 3.5–5.1)
SODIUM: 138 mmol/L (ref 135–145)

## 2016-12-23 LAB — MRSA PCR SCREENING: MRSA by PCR: NEGATIVE

## 2016-12-23 LAB — HEPATITIS B SURFACE ANTIGEN: Hepatitis B Surface Ag: NEGATIVE

## 2016-12-23 MED ORDER — LISINOPRIL 40 MG PO TABS
40.0000 mg | ORAL_TABLET | Freq: Every day | ORAL | Status: DC
  Administered 2016-12-23 – 2016-12-24 (×2): 40 mg via ORAL
  Filled 2016-12-23 (×2): qty 1

## 2016-12-23 MED ORDER — LIDOCAINE-PRILOCAINE 2.5-2.5 % EX CREA: 1.0000 "application " | TOPICAL_CREAM | CUTANEOUS | Status: DC | PRN

## 2016-12-23 MED ORDER — ATORVASTATIN CALCIUM 80 MG PO TABS
80.0000 mg | ORAL_TABLET | Freq: Every day | ORAL | Status: DC
  Administered 2016-12-23: 80 mg via ORAL
  Filled 2016-12-23 (×2): qty 1

## 2016-12-23 MED ORDER — VANCOMYCIN HCL IN DEXTROSE 750-5 MG/150ML-% IV SOLN
INTRAVENOUS | Status: AC
  Filled 2016-12-23: qty 150

## 2016-12-23 MED ORDER — HYDRALAZINE HCL 20 MG/ML IJ SOLN: 10.0000 mg | INTRAMUSCULAR | Status: DC | PRN

## 2016-12-23 MED ORDER — SEVELAMER CARBONATE 800 MG PO TABS: 1600.0000 mg | ORAL_TABLET | Freq: Two times a day (BID) | ORAL | Status: DC | PRN

## 2016-12-23 MED ORDER — AMLODIPINE BESYLATE 5 MG PO TABS
5.0000 mg | ORAL_TABLET | Freq: Every day | ORAL | Status: DC
  Administered 2016-12-23 – 2016-12-24 (×2): 5 mg via ORAL
  Filled 2016-12-23 (×2): qty 1

## 2016-12-23 MED ORDER — DARBEPOETIN ALFA 200 MCG/0.4ML IJ SOSY: 200.0000 ug | PREFILLED_SYRINGE | INTRAMUSCULAR | Status: DC

## 2016-12-23 MED ORDER — SEVELAMER CARBONATE 800 MG PO TABS: 1600.0000 mg | ORAL_TABLET | ORAL | Status: DC

## 2016-12-23 MED ORDER — PENTAFLUOROPROP-TETRAFLUOROETH EX AERO: 1.0000 "application " | INHALATION_SPRAY | CUTANEOUS | Status: DC | PRN

## 2016-12-23 MED ORDER — HYDROCODONE-ACETAMINOPHEN 5-325 MG PO TABS
1.0000 | ORAL_TABLET | ORAL | Status: DC | PRN
  Administered 2016-12-23 – 2016-12-24 (×4): 1 via ORAL
  Filled 2016-12-23 (×4): qty 1

## 2016-12-23 MED ORDER — CINACALCET HCL 30 MG PO TABS
60.0000 mg | ORAL_TABLET | Freq: Every day | ORAL | Status: DC
Start: 2016-12-23 — End: 2016-12-24
  Administered 2016-12-23: 60 mg via ORAL
  Filled 2016-12-23 (×2): qty 2

## 2016-12-23 MED ORDER — ONDANSETRON HCL 4 MG/2ML IJ SOLN: 4.0000 mg | Freq: Four times a day (QID) | INTRAMUSCULAR | Status: DC | PRN

## 2016-12-23 MED ORDER — SODIUM CHLORIDE 0.9 % IV SOLN: 100.0000 mL | INTRAVENOUS | Status: DC | PRN

## 2016-12-23 MED ORDER — ONDANSETRON HCL 4 MG PO TABS: 4.0000 mg | ORAL_TABLET | Freq: Four times a day (QID) | ORAL | Status: DC | PRN

## 2016-12-23 MED ORDER — VANCOMYCIN HCL IN DEXTROSE 750-5 MG/150ML-% IV SOLN
750.0000 mg | INTRAVENOUS | Status: DC
  Administered 2016-12-23: 750 mg via INTRAVENOUS
  Filled 2016-12-23: qty 150

## 2016-12-23 MED ORDER — BOOST / RESOURCE BREEZE PO LIQD
1.0000 | Freq: Every day | ORAL | Status: DC
  Administered 2016-12-23 – 2016-12-24 (×2): 1 via ORAL

## 2016-12-23 MED ORDER — NITROGLYCERIN 0.4 MG SL SUBL: 0.4000 mg | SUBLINGUAL_TABLET | SUBLINGUAL | Status: DC | PRN

## 2016-12-23 MED ORDER — ALTEPLASE 2 MG IJ SOLR: 2.0000 mg | Freq: Once | INTRAMUSCULAR | Status: DC | PRN

## 2016-12-23 MED ORDER — BRIMONIDINE TARTRATE 0.2 % OP SOLN
1.0000 [drp] | Freq: Two times a day (BID) | OPHTHALMIC | Status: DC
  Administered 2016-12-23 – 2016-12-24 (×3): 1 [drp] via OPHTHALMIC
  Filled 2016-12-23 (×2): qty 5

## 2016-12-23 MED ORDER — ISOSORBIDE MONONITRATE ER 30 MG PO TB24
15.0000 mg | ORAL_TABLET | Freq: Every day | ORAL | Status: DC
  Administered 2016-12-23 (×2): 15 mg via ORAL
  Filled 2016-12-23 (×2): qty 1

## 2016-12-23 MED ORDER — SEVELAMER CARBONATE 800 MG PO TABS
2400.0000 mg | ORAL_TABLET | Freq: Three times a day (TID) | ORAL | Status: DC
  Administered 2016-12-23 – 2016-12-24 (×5): 2400 mg via ORAL
  Filled 2016-12-23 (×5): qty 3

## 2016-12-23 MED ORDER — HEPARIN SODIUM (PORCINE) 5000 UNIT/ML IJ SOLN
5000.0000 [IU] | Freq: Three times a day (TID) | INTRAMUSCULAR | Status: DC
  Administered 2016-12-23 – 2016-12-24 (×3): 5000 [IU] via SUBCUTANEOUS
  Filled 2016-12-23 (×3): qty 1

## 2016-12-23 MED ORDER — PANTOPRAZOLE SODIUM 40 MG PO TBEC
40.0000 mg | DELAYED_RELEASE_TABLET | Freq: Every day | ORAL | Status: DC
  Administered 2016-12-23 – 2016-12-24 (×2): 40 mg via ORAL
  Filled 2016-12-23 (×2): qty 1

## 2016-12-23 MED ORDER — GABAPENTIN 300 MG PO CAPS
300.0000 mg | ORAL_CAPSULE | Freq: Every day | ORAL | Status: DC
  Administered 2016-12-23 (×2): 300 mg via ORAL
  Filled 2016-12-23 (×2): qty 1

## 2016-12-23 MED ORDER — LIDOCAINE HCL (PF) 1 % IJ SOLN: 5.0000 mL | INTRAMUSCULAR | Status: DC | PRN

## 2016-12-23 MED ORDER — ACETAMINOPHEN 325 MG PO TABS
650.0000 mg | ORAL_TABLET | Freq: Four times a day (QID) | ORAL | Status: DC | PRN
  Administered 2016-12-23: 650 mg via ORAL
  Filled 2016-12-23: qty 2

## 2016-12-23 MED ORDER — SODIUM CHLORIDE 0.9 % IV SOLN
125.0000 mg | INTRAVENOUS | Status: DC
  Filled 2016-12-23: qty 10

## 2016-12-23 MED ORDER — TIMOLOL MALEATE 0.5 % OP SOLN
1.0000 [drp] | Freq: Two times a day (BID) | OPHTHALMIC | Status: DC
  Administered 2016-12-23 – 2016-12-24 (×3): 1 [drp] via OPHTHALMIC
  Filled 2016-12-23 (×2): qty 5

## 2016-12-23 MED ORDER — CALCITRIOL 0.5 MCG PO CAPS
3.0000 ug | ORAL_CAPSULE | ORAL | Status: DC
  Administered 2016-12-23: 3 ug via ORAL
  Filled 2016-12-23: qty 6

## 2016-12-23 MED ORDER — ACETAMINOPHEN 650 MG RE SUPP: 650.0000 mg | Freq: Four times a day (QID) | RECTAL | Status: DC | PRN

## 2016-12-23 NOTE — Consult Note (Signed)
[facility] [place]  Patient name: Randy [name] MRN: [id number] DOB: 08/03/1932 Sex: male  REASON FOR CONSULT: right BKA cellulitis, consult is from [place]  HPI: Randy [name] is a 83 y.o. male who presents with 2-3 week history of right BKA pain. He underwent right BKA back in April 2016 by Dr. Scot Dock. Since that time, he has hand an area of eschar to the tip of his right BKA. He was last seen by Dr. Scot Dock on 08/14/16 with complaints of right BKA pain. He was recommended right above knee amputation if his pain becomes intolerable. The patient says his pain is "shooting" in nature. He denies any fever or chills.   He underwent left AKA on 10/10/15 by Dr. Scot Dock. He is non ambulatory. His PMH includes ESRD, CAD, hypertension, hx of CVA, hypercholesterolemia.   Past Medical History:  Diagnosis Date  . Anemia of chronic disease   . Arthritis   . CAD (coronary artery disease)    Moderate multivessel disease 07/2015 - managed medically  . ESRD on hemodialysis [facility])    M/W/F in [place] - Dr. Florene Glen  . Essential hypertension   . Gout   . History of blood transfusion   . History of myopericarditis 2015   07/2015  . History of pneumonia 2014  . History of stroke   . Hypercholesterolemia   . Nonischemic cardiomyopathy (HCC)    LVEF 35-40%  . Peripheral vascular disease ([place])    Status post right below knee amputation for a nonhealing wound of the right foot 12/2014  . Pneumonia     Family History  Problem Relation Age of Onset  . Stroke Mother   . Arthritis    . Cancer    . Kidney disease    . Cancer Sister   . Colon cancer Brother     unclear   . Colon cancer Brother   . Anesthesia problems Neg Hx   . Hypotension Neg Hx   . Malignant hyperthermia Neg Hx   . Pseudochol deficiency Neg Hx     SOCIAL HISTORY: Social History   Social History  . Marital status: Married    Spouse name: N/A  . Number of children: N/A  . Years of education: 10    Occupational History  .  Retired   Social History Main Topics  . Smoking status: Former Smoker    Packs/day: 1.00    Years: 30.00    Types: Cigarettes    Quit date: 08/19/1998  . Smokeless tobacco: Former Systems developer    Types: Chew    Quit date: 12/31/1993  . Alcohol use No  . Drug use: No  . Sexual activity: No   Other Topics Concern  . Not on file   Social History Narrative  . No narrative on file    No Known Allergies  Current Facility-Administered Medications  Medication Dose Route Frequency Provider Last Rate Last Dose  . Vancomycin (VANCOCIN) 750-5 MG/150ML-% IVPB           . 0.9 %  sodium chloride infusion  100 mL Intravenous PRN Alric Seton, PA-C      . 0.9 %  sodium chloride infusion  100 mL Intravenous PRN Alric Seton, PA-C      . acetaminophen (TYLENOL) tablet 650 mg  650 mg Oral Q6H PRN Rise Patience, MD   650 mg at 12/23/16 0056   Or  . acetaminophen (TYLENOL) suppository 650 mg  650 mg Rectal Q6H PRN  Rise Patience, MD      . alteplase (CATHFLO ACTIVASE) injection 2 mg  2 mg Intracatheter Once PRN Alric Seton, PA-C      . amLODipine (NORVASC) tablet 5 mg  5 mg Oral Daily Rise Patience, MD      . atorvastatin (LIPITOR) tablet 80 mg  80 mg Oral QPC supper Rise Patience, MD      . brimonidine (ALPHAGAN) 0.2 % ophthalmic solution 1 drop  1 drop Both Eyes BID Rise Patience, MD   1 drop at 12/23/16 0856  . calcitRIOL (ROCALTROL) capsule 3 mcg  3 mcg Oral Q M,W,F-HD Alric Seton, PA-C   3 mcg at 12/23/16 1356  . cinacalcet (SENSIPAR) tablet 60 mg  60 mg Oral Q supper Alric Seton, PA-C      . [START ON 12/25/2016] Darbepoetin Alfa (ARANESP) injection 200 mcg  200 mcg Intravenous Q Wed-HD Alric Seton, PA-C      . feeding supplement (BOOST / RESOURCE BREEZE) liquid 1 Container  1 Container Oral Daily Rise Patience, MD   1 Container at 12/23/16 1000  . gabapentin (NEURONTIN) capsule 300 mg  300 mg Oral QHS Rise Patience,  MD   300 mg at 12/23/16 0049  . heparin injection 5,000 Units  5,000 Units Subcutaneous Q8H Rise Patience, MD   5,000 Units at 12/23/16 0539  . hydrALAZINE (APRESOLINE) injection 10 mg  10 mg Intravenous Q4H PRN Rise Patience, MD      . HYDROcodone-acetaminophen (NORCO/VICODIN) 5-325 MG per tablet 1 tablet  1 tablet Oral Q4H PRN Rise Patience, MD   1 tablet at 12/23/16 0057  . isosorbide mononitrate (IMDUR) 24 hr tablet 15 mg  15 mg Oral QHS Rise Patience, MD   15 mg at 12/23/16 0050  . lidocaine (PF) (XYLOCAINE) 1 % injection 5 mL  5 mL Intradermal PRN Alric Seton, PA-C      . lidocaine-prilocaine (EMLA) cream 1 application  1 application Topical PRN Alric Seton, PA-C      . lisinopril (PRINIVIL,ZESTRIL) tablet 40 mg  40 mg Oral Daily Rise Patience, MD      . nitroGLYCERIN (NITROSTAT) SL tablet 0.4 mg  0.4 mg Sublingual Q5 min PRN Rise Patience, MD      . ondansetron [facility]) tablet 4 mg  4 mg Oral Q6H PRN Rise Patience, MD       Or  . ondansetron (ZOFRAN) injection 4 mg  4 mg Intravenous Q6H PRN Rise Patience, MD      . pantoprazole (PROTONIX) EC tablet 40 mg  40 mg Oral Q1200 Rise Patience, MD   40 mg at 12/23/16 1356  . pentafluoroprop-tetrafluoroeth (GEBAUERS) aerosol 1 application  1 application Topical PRN Alric Seton, PA-C      . piperacillin-tazobactam (ZOSYN) IVPB 3.375 g  3.375 g Intravenous Q12H Gwenyth Allegra Tegeler, MD   3.375 g at 12/23/16 7673  . sevelamer carbonate (RENVELA) tablet 1,600 mg  1,600 mg Oral BID PRN Rise Patience, MD      . sevelamer carbonate (RENVELA) tablet 2,400 mg  2,400 mg Oral TID WC Rise Patience, MD   2,400 mg at 12/23/16 1356  . timolol (TIMOPTIC) 0.5 % ophthalmic solution 1 drop  1 drop Both Eyes BID Rise Patience, MD   1 drop at 12/23/16 0856  . vancomycin (VANCOCIN) IVPB 750 mg/150 ml premix  750 mg Intravenous Q M,W,F-HD Valeda Malm Rumbarger, RPH  REVIEW OF SYSTEMS:   [X]  denotes positive finding, [ ]  denotes negative finding Cardiac  Comments:  Chest pain or chest pressure:    Shortness of breath upon exertion:    Short of breath when lying flat:    Irregular heart rhythm:        Vascular    Pain in calf, thigh, or hip brought on by ambulation:    Pain in feet at night that wakes you up from your sleep:     Blood clot in your veins:    Leg swelling:         Pulmonary    Oxygen at home:    Productive cough:     Wheezing:         Neurologic    Sudden weakness in arms or legs:     Sudden numbness in arms or legs:     Sudden onset of difficulty speaking or slurred speech:    Temporary loss of vision in one eye:     Problems with dizziness:         Gastrointestinal    Blood in stool:     Vomited blood:         Genitourinary    Burning when urinating:     Blood in urine:        Psychiatric    Major depression:         Hematologic    Bleeding problems:    Problems with blood clotting too easily:        Skin    Rashes or ulcers: x       Constitutional    Fever or chills:      PHYSICAL EXAM: Vitals:   12/23/16 0453 12/23/16 1439 12/23/16 1444 12/23/16 1530  BP: (!) 104/53 106/60 117/65 (!) 113/55  Pulse: 61 60 66 68  Resp: 17 18 16 18   Temp: 98.4 F (36.9 C) 98 F (36.7 C)    TempSrc: Oral Oral    SpO2: 97% 95%    Weight:  145 lb 1 oz (65.8 kg)      GENERAL: The patient is a well-nourished male, in no acute distress. The vital signs are documented above. CARDIAC: There is a regular rate and rhythm.  VASCULAR: Right BKA with 2 x 3 cm dry eschar to tip of right stump. No drainage or erythema seen. Left AKA well healed.  PULMONARY: There is good air exchange bilaterally without wheezing or rales. MUSCULOSKELETAL: See vascular above. No other deformities.  NEUROLOGIC: No focal weakness or paresthesias are detected. SKIN: There are no ulcers or rashes noted. PSYCHIATRIC: The patient has a normal affect.  DATA:   IMPRESSION: 1. Minimal marrow signal abnormality noted at the tibial site of amputation. This in conjunction with surrounding cellulitis and edema raises the possibility of early osteomyelitis of the tibial stump. 2. No acute meniscal, cruciate or collateral ligament abnormality. The posterior horn of the medial meniscus is obscured by susceptibility artifacts.   MEDICAL ISSUES: Right BKA pain  Patient with 2-3 week history of right stump pain. MRI right knee shows possible tibial osteomyelitis. No evidence of abscess. The patient underwent right BKA back in April 2016. Since that time, he has hand an area of eschar to the tip of his right BKA. Was seen back in November 2017 by Dr. Scot Dock for similar symptoms who recommended right AKA if pain becomes intolerable.   The patient has a left AKA and uses a board for transfer. He does  not ambulate. His pain has started to improve with antibiotics. Continue antibiotics for now. The patient and his daughter are reluctant to undergo any further surgery at this time. If symptoms do not improve with antibiotics recommend right above knee amputation. They are agreeable to this. Dr. Donnetta Hutching to see patient.    Virgina Jock, PA-C [facility] [place]  I have examined the patient, reviewed and agree with above. No evidence of infection. Eschar at the tip of his BKA. Appears similar to Dr. Scot Dock visit 3 months ago. Again discussed only option would be revision to above-knee amputation which the patient currently is not interested in. [facility] notify Dr. Scot Dock of his admission he will see him tomorrow  Curt Jews, MD 12/23/2016 4:27 PM

## 2016-12-23 NOTE — H&P (Signed)
History and Physical    [name] [name]:[id number] DOB: 1932-06-26 DOA: 12/22/2016  PCP: [name], MD  Patient coming from: Home.  Chief Complaint: Right lower extremity stump pain.  HPI: Randy [name] is a 83 y.o. male with history of ESRD on hemodialysis on Monday Wednesday and Friday, atrial fibrillation, hypertension, CAD presents to the ER because of increasing pain in the right lower extremity around the stump area. Patient has been having this pain for last few weeks. Denies any trauma or fall. Denies any fever or chills.   ED Course: MRI done in the ER shows possible cellulitis with early developing osteomyelitis. He had physician had discussed with on-call vascular surgeon Dr. Bridgett Larsson who advised admission and consult Dr. Scot Dock patient's vascular surgeon in a.m.  Review of Systems: As per HPI, rest all negative.   Past Medical History:  Diagnosis Date  . Anemia of chronic disease   . Arthritis   . CAD (coronary artery disease)    Moderate multivessel disease 07/2015 - managed medically  . ESRD on hemodialysis [facility])    M/W/F in [place] - Dr. Florene Glen  . Essential hypertension   . Gout   . History of blood transfusion   . History of myopericarditis 2015   07/2015  . History of pneumonia 2014  . History of stroke   . Hypercholesterolemia   . Nonischemic cardiomyopathy (HCC)    LVEF 35-40%  . Peripheral vascular disease ([place])    Status post right below knee amputation for a nonhealing wound of the right foot 12/2014  . Pneumonia     Past Surgical History:  Procedure Laterality Date  . ABOVE KNEE LEG AMPUTATION Left 10/10/2015  . AMPUTATION Right 01/05/2015   BKA  . AMPUTATION Left 10/10/2015   Procedure: AMPUTATION ABOVE KNEE LEFT;  Surgeon: Angelia Mould, MD;  Location: [place];  Service: Vascular;  Laterality: Left;  . AV FISTULA PLACEMENT  08/24/2012   Procedure: ARTERIOVENOUS (AV) FISTULA CREATION;  Surgeon: Rosetta Posner, MD;  Location: [place];   Service: Vascular;  Laterality: Left;  . BACK SURGERY    . CAPD REMOVAL N/A 03/13/2015   Procedure: CONTINUOUS AMBULATORY PERITONEAL DIALYSIS  (CAPD) CATHETER REMOVAL;  Surgeon: Coralie Keens, MD;  Location: [place];  Service: General;  Laterality: N/A;  . CARDIAC CATHETERIZATION    . CHOLECYSTECTOMY    . COLONOSCOPY     remote past by Dr. Tamala Julian in [place], [place]   . COLONOSCOPY Left 09/26/2014   Dr. Paulita Fujita: old and semi-fresh blood scattered throughout the colon, several medium-sized diverticula, no obvious focal bleeding site, no blood in TI, several polyps seen in cecum, ascending colon and proximal transverse s/p biopsy (tubular adenomas)  . COLONOSCOPY N/A 03/05/2016   Dr. Oneida Alar: old blood in ileum, few fresh clots ascending colon, frequent large mouth diverticula and old clots in descending/sigmoid. 8 polyps in ascending colon not manipulated  . ESOPHAGOGASTRODUODENOSCOPY N/A 03/05/2016   Dr. Oneida Alar: hiatal hernia, mild gastritis   . GANGLION CYST EXCISION  01/03/2012   Procedure: REMOVAL GANGLION OF WRIST;  Surgeon: Scherry Ran, MD;  Location: [facility];  Service: General;  Laterality: Right;  . GIVENS CAPSULE STUDY N/A 03/07/2016   [place]: dark material in ileum, with fresh blood in right colon   . HEMORRHOID SURGERY  1970's  . INSERTION OF DIALYSIS CATHETER Right     neck  . INSERTION OF DIALYSIS CATHETER  10/19/2012   Procedure: INSERTION OF DIALYSIS CATHETER;  Surgeon: Sherren Mocha  Katina Dung, MD;  Location: [facility];  Service: Vascular;  Laterality: N/A;  [place] ARTHROSCOPY Right 2007  . LEFT HEART CATHETERIZATION WITH CORONARY ANGIOGRAM N/A 08/17/2014   Procedure: LEFT HEART CATHETERIZATION WITH CORONARY ANGIOGRAM;  Surgeon: Larey Dresser, MD;  Location: [facility] CATH LAB;  Service: Cardiovascular;  Laterality: N/A;  . LUMBAR [place] SURGERY  2004     reports that he quit smoking about 18 years ago. His smoking use included Cigarettes. He has a 30.00 pack-year smoking  history. He quit smokeless tobacco use about 22 years ago. His smokeless tobacco use included Chew. He reports that he does not drink alcohol or use drugs.  No Known Allergies  Family History  Problem Relation Age of Onset  . Stroke Mother   . Arthritis    . Cancer    . Kidney disease    . Cancer Sister   . Colon cancer Brother     unclear   . Colon cancer Brother   . Anesthesia problems Neg Hx   . Hypotension Neg Hx   . Malignant hyperthermia Neg Hx   . Pseudochol deficiency Neg Hx     Prior to Admission medications   Medication Sig Start Date End Date Taking? Authorizing Provider  amLODipine (NORVASC) 5 MG tablet Take 5 mg by mouth daily.   Yes Historical Provider, MD  atorvastatin (LIPITOR) 80 MG tablet Take 1 tablet (80 mg total) by mouth daily. Patient taking differently: Take 80 mg by mouth daily after supper.  11/02/15  Yes Arnoldo Lenis, MD  brimonidine (ALPHAGAN) 0.2 % ophthalmic solution Place 1 drop into both eyes 2 (two) times daily.   Yes Historical Provider, MD  Cinacalcet HCl (SENSIPAR PO) Take 1 tablet by mouth daily after supper.   Yes Historical Provider, MD  diclofenac sodium (VOLTAREN) 1 % GEL Apply 1 application topically 3 (three) times daily as needed (pain).  12/12/16  Yes Historical Provider, MD  docusate sodium (COLACE) 100 MG capsule Take 2 capsules (200 mg total) by mouth daily. Patient taking differently: Take 200-300 mg by mouth daily as needed for mild constipation or moderate constipation.  10/31/15  Yes Ivan Anchors Love, PA-C  feeding supplement (BOOST / RESOURCE BREEZE) LIQD Take 1 Container by mouth 4 (four) times daily -  with meals and at bedtime. Patient taking differently: Take 1 Container by mouth daily.  03/14/16  Yes Lavina Hamman, MD  gabapentin (NEURONTIN) 300 MG capsule Take 300 mg by mouth at bedtime.   Yes Historical Provider, MD  hydrALAZINE (APRESOLINE) 50 MG tablet Take 50 mg by mouth 2 (two) times daily as needed (high blood pressure  >160/80).    Yes Historical Provider, MD  HYDROcodone-acetaminophen (NORCO/VICODIN) 5-325 MG tablet Take 1 tablet by mouth every 4 (four) hours as needed. Patient taking differently: Take 1 tablet by mouth every 4 (four) hours as needed (pain).  11/13/16  Yes Orpah Greek, MD  isosorbide mononitrate (IMDUR) 30 MG 24 hr tablet TAKE 1/2 TABLET BY MOUTH AT BEDTIME. 07/10/16  Yes Arnoldo Lenis, MD  ketoconazole (NIZORAL) 2 % cream Apply 1 application topically daily as needed for irritation (itching).  11/07/16  Yes Historical Provider, MD  lidocaine-prilocaine (EMLA) cream Apply 1 application topically every Monday, Wednesday, and Friday. For dialysis 10/06/15  Yes Historical Provider, MD  lisinopril (PRINIVIL,ZESTRIL) 40 MG tablet Take 40 mg by mouth daily. 12/03/16  Yes Historical Provider, MD  nitroGLYCERIN (NITROSTAT) 0.4 MG SL tablet  Place 1 tablet (0.4 mg total) under the tongue every 5 (five) minutes as needed for chest pain. 12/20/15  Yes Ripudeep Krystal Eaton, MD  pantoprazole (PROTONIX) 40 MG tablet Take 1 tablet (40 mg total) by mouth daily at 12 noon. 10/23/15  Yes Barton Dubois, MD  sevelamer carbonate (RENVELA) 800 MG tablet Take 1,600-2,400 mg by mouth See admin instructions. Take 3 tablets (2400 mg) with meals and 2 tablets (1600 mg) with snacks   Yes Historical Provider, MD  timolol (BETIMOL) 0.5 % ophthalmic solution Place 1 drop into both eyes 2 (two) times daily.   Yes Historical Provider, MD    Physical Exam: Vitals:   12/22/16 2100 12/22/16 2115 12/22/16 2130 12/22/16 2216  BP: (!) 148/82 127/63 120/63 121/63  Pulse: 76 72 74 64  Resp: 12 20 15 16   Temp:    97.5 F (36.4 C)  TempSrc:    Oral  SpO2: 99% 98% 95% 95%  Weight:    68.7 kg (151 lb 6.4 oz)      Constitutional: Moderately built and nourished. Vitals:   12/22/16 2100 12/22/16 2115 12/22/16 2130 12/22/16 2216  BP: (!) 148/82 127/63 120/63 121/63  Pulse: 76 72 74 64  Resp: 12 20 15 16   Temp:    97.5 F (36.4  C)  TempSrc:    Oral  SpO2: 99% 98% 95% 95%  Weight:    68.7 kg (151 lb 6.4 oz)   Eyes: Anicteric no pallor. ENMT: No discharge from the ears eyes nose and mouth. Neck: No mass felt. No neck rigidity. Respiratory: No rhonchi or crepitations. Cardiovascular: S1-S2 heard no murmurs appreciated. Abdomen: Soft nontender bowel sounds present. Musculoskeletal: Bilateral BKA. No active discharge seen. Skin: No active discharge or rash seen. Neurologic: Alert awake oriented to time place and person. Moves all extremities. Psychiatric: Appears normal. Normal affect.   Labs on Admission: I have personally reviewed following labs and imaging studies  CBC:  Recent Labs Lab 12/22/16 1225  WBC 4.6  NEUTROABS 3.2  HGB 11.4*  HCT 38.7*  MCV 97.2  PLT 099   Basic Metabolic Panel:  Recent Labs Lab 12/22/16 1225  NA 136  K 4.5  CL 97*  CO2 26  GLUCOSE 138*  BUN 53*  CREATININE 8.78*  CALCIUM 7.9*   GFR: Estimated Creatinine Clearance: 5.5 mL/min (A) (by C-G formula based on SCr of 8.78 mg/dL (H)). Liver Function Tests:  Recent Labs Lab 12/22/16 1225  AST 14*  ALT 11*  ALKPHOS 74  BILITOT 0.5  PROT 6.1*  ALBUMIN 3.0*   No results for input(s): LIPASE, AMYLASE in the last 168 hours. No results for input(s): AMMONIA in the last 168 hours. Coagulation Profile:  Recent Labs Lab 12/22/16 1225  INR 1.05   Cardiac Enzymes: No results for input(s): CKTOTAL, CKMB, CKMBINDEX, TROPONINI in the last 168 hours. BNP (last 3 results) No results for input(s): PROBNP in the last 8760 hours. HbA1C: No results for input(s): HGBA1C in the last 72 hours. CBG: No results for input(s): GLUCAP in the last 168 hours. Lipid Profile: No results for input(s): CHOL, HDL, LDLCALC, TRIG, CHOLHDL, LDLDIRECT in the last 72 hours. Thyroid Function Tests: No results for input(s): TSH, T4TOTAL, FREET4, T3FREE, THYROIDAB in the last 72 hours. Anemia Panel: No results for input(s):  VITAMINB12, FOLATE, FERRITIN, TIBC, IRON, RETICCTPCT in the last 72 hours. Urine analysis:    Component Value Date/Time   COLORURINE YELLOW 06/22/2014 [place] 06/22/2014 0531  LABSPEC 1.015 06/22/2014 0531   PHURINE 6.0 06/22/2014 0531   GLUCOSEU 100 (A) 06/22/2014 0531   HGBUR SMALL (A) 06/22/2014 0531   HGBUR trace-lysed 06/03/2008 0855   BILIRUBINUR NEGATIVE 06/22/2014 0531   KETONESUR TRACE (A) 06/22/2014 0531   PROTEINUR 100 (A) 06/22/2014 0531   UROBILINOGEN 0.2 06/22/2014 0531   NITRITE NEGATIVE 06/22/2014 0531   LEUKOCYTESUR NEGATIVE 06/22/2014 0531   Sepsis Labs: @LABRCNTIP (procalcitonin:4,lacticidven:4) )No results found for this or any previous visit (from the past 240 hour(s)).   Radiological Exams on Admission: Mr Knee Right Wo Contrast  Result Date: 12/22/2016 CLINICAL DATA:  Pain and swelling of the amputation stump of the right lower extremity x2 weeks. No fever. EXAM: MRI OF THE RIGHT KNEE WITHOUT CONTRAST TECHNIQUE: Multiplanar, multisequence MR imaging of the knee was performed. No intravenous contrast was administered. COMPARISON:  Plain radiographs from 12/22/2016 FINDINGS: Susceptibility artifact is noted emanating from the posteromedial aspect of the stump. This slightly limits assessment. MENISCI Medial meniscus: Limited assessment of the posterior horn due to susceptibility artifacts adjacent to the posterior horn. No frank tear identified. Lateral meniscus:  Intact LIGAMENTS Cruciates:  Intact Collaterals: Medial collateral ligament is intact. Lateral collateral ligament complex is intact. CARTILAGE Patellofemoral: Mild chondromalacia of the lateral patellar cartilage with slight thinning of the trochlear cartilage suggested. Medial:  Irregular femorotibial partial-thickness cartilage loss. Lateral:  No chondral defect Joint:  No joint effusion. Normal Hoffa's fat. No plical thickening. Popliteal Fossa: Limited by susceptibility artifacts. No  definite popliteal cyst identified though this region is obscured by artifacts. Extensor Mechanism:  Intact quadriceps tendon and patellar tendon. Bones: Faint marrow signal abnormalities along the surgical margin of the tibial shaft amputation. Changes of early osteomyelitis of concern. Other: Extensive stump edema of the surrounding soft tissues.No focal abscess or draining sinuses. IMPRESSION: 1. Minimal marrow signal abnormality noted at the tibial site of amputation. This in conjunction with surrounding cellulitis and edema raises the possibility of early osteomyelitis of the tibial stump. 2. No acute meniscal, cruciate or collateral ligament abnormality. The posterior horn of the medial meniscus is obscured by susceptibility artifacts. Electronically Signed   By: Ashley Royalty M.D.   On: 12/22/2016 19:15   Mr Hip Left Wo Contrast  Result Date: 12/22/2016 CLINICAL DATA:  Two weeks of swelling.  No known trauma. EXAM: MR OF THE LEFT HIP WITHOUT CONTRAST TECHNIQUE: Multiplanar, multisequence MR imaging was performed. No intravenous contrast was administered. COMPARISON:  None. FINDINGS: Bones: No hip fracture, dislocation or avascular necrosis. No periosteal reaction or bone destruction. No aggressive osseous lesion. Normal sacrum and sacroiliac joints. No SI joint widening or erosive changes. Degenerative disc disease with disc height loss at L4-5 and L5-S1. Articular cartilage and labrum Articular cartilage: High-grade partial-thickness cartilage loss of of the left hip. Partial-thickness cartilage loss of the right hip. Labrum: Grossly intact, but evaluation is limited by lack of intraarticular fluid. Joint or bursal effusion Joint effusion: Small right hip joint effusion. Small left hip joint effusion. No SI joint effusion. Bursae:  No bursa formation. Muscles and tendons Flexors: Mild muscle edema in the iliacus muscle bilaterally. Extensors:  Mild muscle edema in the right vastus lateralis muscle.  Abductors: Normal. Adductors: Mild muscle edema in the right adductor magnus muscle. Gluteals: Muscle edema in the left superior gluteus maximus muscle. Hamstrings: Normal. Other findings Miscellaneous: No pelvic free fluid. No fluid collection or hematoma. No inguinal lymphadenopathy. No inguinal hernia. IMPRESSION: 1. No hip fracture, dislocation or avascular necrosis. 2. Moderate  osteoarthritis of the left hip. Mild osteoarthritis of the right hip. 3. Mild muscle strain involving the the right vastus lateralis muscle, right adductor magnus muscle and left superior gluteus maximus muscle. Electronically Signed   By: Kathreen Devoid   On: 12/22/2016 17:39   Dg Knee Complete 4 Views Right  Result Date: 12/22/2016 CLINICAL DATA:  Patient complains of 2 weeks of swelling to right stump, denies any trauma and does not wear prosthetic. Also complains of left hip pain with with movement. States that he is suppose to be scheduled for appointment at wound clinic in future. No fever, NAD. Noticeable green circular scab on bottom of right stump. EXAM: RIGHT KNEE - COMPLETE 4+ VIEW COMPARISON:  None. FINDINGS: Status post below-the-knee amputation. The amputated margins of the remaining tibia and fibula are well-defined. There is no bone resorption to suggest osteomyelitis. No fracture. Bones are demineralized. There is mild medial and lateral femorotibial joint space compartment narrowing. No knee joint effusion. Dense vascular calcifications are noted along the distal superficial femoral artery and popliteal artery. Soft tissue edema is seen overlying the stump. No soft tissue air. A small metallic foreign body projects within the posterior subcutaneous soft tissues at the level of the knee. IMPRESSION: 1. No fracture.  No evidence of osteomyelitis. 2. Soft tissue swelling overlying the amputated distal tibia and fibula. Electronically Signed   By: Lajean Manes M.D.   On: 12/22/2016 13:19   Dg Hip Unilat With Pelvis 2-3  Views Left  Result Date: 12/22/2016 CLINICAL DATA:  Patient complains of 2 weeks of swelling to right stump, denies any trauma and does not wear prosthetic. Also complains of left hip pain with with movement. States that he is suppose to be scheduled for appointment at wound clinic in future. No fever, NAD. Noticeable green circular scab on bottom of right stump. EXAM: DG HIP (WITH OR WITHOUT PELVIS) 2-3V LEFT COMPARISON:  None. FINDINGS: No fracture.  No bone lesion. The hip joint is normally aligned. There is mild concentric hip joint space narrowing. Small marginal osteophytes project from the inferior femoral head. Bones are demineralized. There is dense calcification along the iliac and femoral vessels. IMPRESSION: 1. No fracture or bone lesion. 2. Mild hip joint arthropathic change. Electronically Signed   By: Lajean Manes M.D.   On: 12/22/2016 13:21     Assessment/Plan Principal Problem:   Amputation stump infection ([place]) Active Problems:   ESRD (end stage renal disease) on dialysis ([place])   Chronic combined systolic and diastolic congestive heart failure (HCC)   Essential hypertension   Osteomyelitis ([place])    1. Possible right lower extremity stump cellulitis with developing osteomyelitis - patient is placed on empiric antibiotics. Consult patient's vascular surgeon Dr. Scot Dock in a.m. 2. ESRD on hemodialysis on Monday lasted Friday - nephrology consult for dialysis. Patient does not appear fluid overload. 3. Chronic atrial fibrillation - not on anticoagulation secondary to GI bleed. Rate control. 4. Hypertension on amlodipine and lisinopril Imdur hydralazine. 5. History of CAD medically managed. 6. Anemia probably secondary to ESRD - follow CBC.   DVT prophylaxis: Heparin. Code Status: Full code.  Family Communication: Discussed with patient.  Disposition Plan: Home.  Consults called: ER physician had discussed with vascular surgeon.  Admission status: Observation.     Rise Patience MD [facility] Pager [phone].  If 7PM-7AM, please contact night-coverage [email or web address] Password [facility]  12/23/2016, 12:15 AM

## 2016-12-23 NOTE — Consult Note (Signed)
[facility] Renal Consultation Note    Indication for Consultation:  Management of ESRD/hemodialysis; anemia, hypertension/volume and secondary hyperparathyroidism PCP: [name], MD  Referring physician:  Loetta Rough, MD  HPI: Randy [name] is a 83 y.o. male with ESRD secondary to PCKD/HTN , PAD, s/p bilateral amputations, left AKA and right BKA, hx GIB who presented with worsening right BKA stump pain- he was last seen by [place] Friday at dialysis and was referred for wound care; patient also had a prescheduled appointment with [facility] 4/10.  He has been getting +/- EDW with net UF volumes in  the 1.5 - 3.3 range.He has been afebrile at dialysis and BP are controlled. MRI in the ED showed possible cellulitis with early developing osteo.  He also had hip films and MRI which did not show any acute findings. He is afebrile, WBC is wnl.  Admission chemistries and hgb are to be expected for ESRD. He tells me his right stump was more swollen yesterday than it is today but he had been sitting up much of the day and has been in bed since admission. The stump is most painful distally.  He has had no N, V, D, fever or chills.  Appetite is good. Outpatient HD treatments have been unremarkable. THe is due for dialysis today.  Past Medical History:  Diagnosis Date  . Anemia of chronic disease   . Arthritis   . CAD (coronary artery disease)    Moderate multivessel disease 07/2015 - managed medically  . ESRD on hemodialysis [facility])    M/W/F in [place] - Dr. Florene Glen  . Essential hypertension   . Gout   . History of blood transfusion   . History of myopericarditis 2015   07/2015  . History of pneumonia 2014  . History of stroke   . Hypercholesterolemia   . Nonischemic cardiomyopathy (HCC)    LVEF 35-40%  . Peripheral vascular disease ([place])    Status post right below knee amputation for a nonhealing wound of the right foot 12/2014  . Pneumonia    Past Surgical History:   Procedure Laterality Date  . ABOVE KNEE LEG AMPUTATION Left 10/10/2015  . AMPUTATION Right 01/05/2015   BKA  . AMPUTATION Left 10/10/2015   Procedure: AMPUTATION ABOVE KNEE LEFT;  Surgeon: Angelia Mould, MD;  Location: [place];  Service: Vascular;  Laterality: Left;  . AV FISTULA PLACEMENT  08/24/2012   Procedure: ARTERIOVENOUS (AV) FISTULA CREATION;  Surgeon: Rosetta Posner, MD;  Location: [place];  Service: Vascular;  Laterality: Left;  . BACK SURGERY    . CAPD REMOVAL N/A 03/13/2015   Procedure: CONTINUOUS AMBULATORY PERITONEAL DIALYSIS  (CAPD) CATHETER REMOVAL;  Surgeon: Coralie Keens, MD;  Location: [place];  Service: General;  Laterality: N/A;  . CARDIAC CATHETERIZATION    . CHOLECYSTECTOMY    . COLONOSCOPY     remote past by Dr. Tamala Julian in [place], [place]   . COLONOSCOPY Left 09/26/2014   Dr. Paulita Fujita: old and semi-fresh blood scattered throughout the colon, several medium-sized diverticula, no obvious focal bleeding site, no blood in TI, several polyps seen in cecum, ascending colon and proximal transverse s/p biopsy (tubular adenomas)  . COLONOSCOPY N/A 03/05/2016   Dr. Oneida Alar: old blood in ileum, few fresh clots ascending colon, frequent large mouth diverticula and old clots in descending/sigmoid. 8 polyps in ascending colon not manipulated  . ESOPHAGOGASTRODUODENOSCOPY N/A 03/05/2016   Dr. Oneida Alar: hiatal hernia, mild gastritis   . GANGLION CYST EXCISION  01/03/2012  Procedure: REMOVAL GANGLION OF WRIST;  Surgeon: Scherry Ran, MD;  Location: [facility];  Service: General;  Laterality: Right;  . GIVENS CAPSULE STUDY N/A 03/07/2016   [place]: dark material in ileum, with fresh blood in right colon   . HEMORRHOID SURGERY  1970's  . INSERTION OF DIALYSIS CATHETER Right     neck  . INSERTION OF DIALYSIS CATHETER  10/19/2012   Procedure: INSERTION OF DIALYSIS CATHETER;  Surgeon: Rosetta Posner, MD;  Location: [place];  Service: Vascular;  Laterality: N/A;  [place]  ARTHROSCOPY Right 2007  . LEFT HEART CATHETERIZATION WITH CORONARY ANGIOGRAM N/A 08/17/2014   Procedure: LEFT HEART CATHETERIZATION WITH CORONARY ANGIOGRAM;  Surgeon: Larey Dresser, MD;  Location: [facility] CATH LAB;  Service: Cardiovascular;  Laterality: N/A;  . LUMBAR [place] SURGERY  2004   Family History  Problem Relation Age of Onset  . Stroke Mother   . Arthritis    . Cancer    . Kidney disease    . Cancer Sister   . Colon cancer Brother     unclear   . Colon cancer Brother   . Anesthesia problems Neg Hx   . Hypotension Neg Hx   . Malignant hyperthermia Neg Hx   . Pseudochol deficiency Neg Hx    Social History:  reports that he quit smoking about 18 years ago. His smoking use included Cigarettes. He has a 30.00 pack-year smoking history. He quit smokeless tobacco use about 22 years ago. His smokeless tobacco use included Chew. He reports that he does not drink alcohol or use drugs. No Known Allergies Prior to Admission medications   Medication Sig Start Date End Date Taking? Authorizing Provider  amLODipine (NORVASC) 5 MG tablet Take 5 mg by mouth daily.   Yes Historical Provider, MD  atorvastatin (LIPITOR) 80 MG tablet Take 1 tablet (80 mg total) by mouth daily. Patient taking differently: Take 80 mg by mouth daily after supper.  11/02/15  Yes Arnoldo Lenis, MD  brimonidine (ALPHAGAN) 0.2 % ophthalmic solution Place 1 drop into both eyes 2 (two) times daily.   Yes Historical Provider, MD  cinacalcet (SENSIPAR) 60 MG tablet Take 60 mg by mouth daily after supper.    Yes Historical Provider, MD  diclofenac sodium (VOLTAREN) 1 % GEL Apply 1 application topically 3 (three) times daily as needed (pain).  12/12/16  Yes Historical Provider, MD  docusate sodium (COLACE) 100 MG capsule Take 2 capsules (200 mg total) by mouth daily. Patient taking differently: Take 200-300 mg by mouth daily as needed for mild constipation or moderate constipation.  10/31/15  Yes Ivan Anchors Love, PA-C  feeding  supplement (BOOST / RESOURCE BREEZE) LIQD Take 1 Container by mouth 4 (four) times daily -  with meals and at bedtime. Patient taking differently: Take 1 Container by mouth daily.  03/14/16  Yes Lavina Hamman, MD  gabapentin (NEURONTIN) 300 MG capsule Take 300 mg by mouth at bedtime.   Yes Historical Provider, MD  hydrALAZINE (APRESOLINE) 50 MG tablet Take 50 mg by mouth 2 (two) times daily as needed (high blood pressure >160/80).    Yes Historical Provider, MD  HYDROcodone-acetaminophen (NORCO/VICODIN) 5-325 MG tablet Take 1 tablet by mouth every 4 (four) hours as needed. Patient taking differently: Take 1 tablet by mouth every 4 (four) hours as needed (pain).  11/13/16  Yes Orpah Greek, MD  isosorbide mononitrate (IMDUR) 30 MG 24 hr tablet TAKE 1/2 TABLET BY MOUTH  AT BEDTIME. 07/10/16  Yes Arnoldo Lenis, MD  ketoconazole (NIZORAL) 2 % cream Apply 1 application topically daily as needed for irritation (itching).  11/07/16  Yes Historical Provider, MD  lidocaine-prilocaine (EMLA) cream Apply 1 application topically every Monday, Wednesday, and Friday. For dialysis 10/06/15  Yes Historical Provider, MD  lisinopril (PRINIVIL,ZESTRIL) 40 MG tablet Take 40 mg by mouth daily. 12/03/16  Yes Historical Provider, MD  nitroGLYCERIN (NITROSTAT) 0.4 MG SL tablet Place 1 tablet (0.4 mg total) under the tongue every 5 (five) minutes as needed for chest pain. 12/20/15  Yes Ripudeep Krystal Eaton, MD  pantoprazole (PROTONIX) 40 MG tablet Take 1 tablet (40 mg total) by mouth daily at 12 noon. 10/23/15  Yes Barton Dubois, MD  sevelamer carbonate (RENVELA) 800 MG tablet Take 1,600-2,400 mg by mouth See admin instructions. Take 3 tablets (2400 mg) with meals and 2 tablets (1600 mg) with snacks   Yes Historical Provider, MD  timolol (BETIMOL) 0.5 % ophthalmic solution Place 1 drop into both eyes 2 (two) times daily.   Yes Historical Provider, MD   Current Facility-Administered Medications  Medication Dose Route  Frequency Provider Last Rate Last Dose  . acetaminophen (TYLENOL) tablet 650 mg  650 mg Oral Q6H PRN Rise Patience, MD   650 mg at 12/23/16 0056   Or  . acetaminophen (TYLENOL) suppository 650 mg  650 mg Rectal Q6H PRN Rise Patience, MD      . amLODipine (NORVASC) tablet 5 mg  5 mg Oral Daily Rise Patience, MD      . atorvastatin (LIPITOR) tablet 80 mg  80 mg Oral QPC supper Rise Patience, MD      . brimonidine (ALPHAGAN) 0.2 % ophthalmic solution 1 drop  1 drop Both Eyes BID Rise Patience, MD   1 drop at 12/23/16 0856  . calcitRIOL (ROCALTROL) capsule 3 mcg  3 mcg Oral Q M,W,F-HD Alric Seton, PA-C      . Derrill Memo ON 12/25/2016] Darbepoetin Alfa (ARANESP) injection 200 mcg  200 mcg Intravenous Q Wed-HD Alric Seton, PA-C      . feeding supplement (BOOST / RESOURCE BREEZE) liquid 1 Container  1 Container Oral Daily Rise Patience, MD   1 Container at 12/23/16 1000  . gabapentin (NEURONTIN) capsule 300 mg  300 mg Oral QHS Rise Patience, MD   300 mg at 12/23/16 0049  . heparin injection 5,000 Units  5,000 Units Subcutaneous Q8H Rise Patience, MD   5,000 Units at 12/23/16 9449  . hydrALAZINE (APRESOLINE) injection 10 mg  10 mg Intravenous Q4H PRN Rise Patience, MD      . HYDROcodone-acetaminophen (NORCO/VICODIN) 5-325 MG per tablet 1 tablet  1 tablet Oral Q4H PRN Rise Patience, MD   1 tablet at 12/23/16 0057  . isosorbide mononitrate (IMDUR) 24 hr tablet 15 mg  15 mg Oral QHS Rise Patience, MD   15 mg at 12/23/16 0050  . lisinopril (PRINIVIL,ZESTRIL) tablet 40 mg  40 mg Oral Daily Rise Patience, MD      . nitroGLYCERIN (NITROSTAT) SL tablet 0.4 mg  0.4 mg Sublingual Q5 min PRN Rise Patience, MD      . ondansetron [facility]) tablet 4 mg  4 mg Oral Q6H PRN Rise Patience, MD       Or  . ondansetron (ZOFRAN) injection 4 mg  4 mg Intravenous Q6H PRN Rise Patience, MD      . pantoprazole (PROTONIX) EC  tablet 40 mg  40  mg Oral Q1200 Rise Patience, MD      . piperacillin-tazobactam (ZOSYN) IVPB 3.375 g  3.375 g Intravenous Q12H Gwenyth Allegra Tegeler, MD   3.375 g at 12/23/16 6269  . sevelamer carbonate (RENVELA) tablet 1,600 mg  1,600 mg Oral BID PRN Rise Patience, MD      . sevelamer carbonate (RENVELA) tablet 2,400 mg  2,400 mg Oral TID WC Rise Patience, MD   2,400 mg at 12/23/16 0855  . timolol (TIMOPTIC) 0.5 % ophthalmic solution 1 drop  1 drop Both Eyes BID Rise Patience, MD   1 drop at 12/23/16 0856  . vancomycin (VANCOCIN) IVPB 750 mg/150 ml premix  750 mg Intravenous Q M,W,F-HD Para [facility], [facility]       Labs: Basic Metabolic Panel:  Recent Labs Lab 12/22/16 1225 12/23/16 0331  NA 136 138  K 4.5 4.7  CL 97* 96*  CO2 26 26  GLUCOSE 138* 120*  BUN 53* 62*  CREATININE 8.78* 9.75*  CALCIUM 7.9* 7.7*   Liver Function Tests:  Recent Labs Lab 12/22/16 1225  AST 14*  ALT 11*  ALKPHOS 74  BILITOT 0.5  PROT 6.1*  ALBUMIN 3.0*   CBC:  Recent Labs Lab 12/22/16 1225 12/23/16 0331  WBC 4.6 5.8  NEUTROABS 3.2  --   HGB 11.4* 10.7*  HCT 38.7* 35.9*  MCV 97.2 96.8  PLT 187 166   Studies/Results: Mr Knee Right Wo Contrast  Result Date: 12/22/2016 CLINICAL DATA:  Pain and swelling of the amputation stump of the right lower extremity x2 weeks. No fever. EXAM: MRI OF THE RIGHT KNEE WITHOUT CONTRAST TECHNIQUE: Multiplanar, multisequence MR imaging of the knee was performed. No intravenous contrast was administered. COMPARISON:  Plain radiographs from 12/22/2016 FINDINGS: Susceptibility artifact is noted emanating from the posteromedial aspect of the stump. This slightly limits assessment. MENISCI Medial meniscus: Limited assessment of the posterior horn due to susceptibility artifacts adjacent to the posterior horn. No frank tear identified. Lateral meniscus:  Intact LIGAMENTS Cruciates:  Intact Collaterals: Medial collateral ligament is intact. Lateral collateral  ligament complex is intact. CARTILAGE Patellofemoral: Mild chondromalacia of the lateral patellar cartilage with slight thinning of the trochlear cartilage suggested. Medial:  Irregular femorotibial partial-thickness cartilage loss. Lateral:  No chondral defect Joint:  No joint effusion. Normal Hoffa's fat. No plical thickening. Popliteal Fossa: Limited by susceptibility artifacts. No definite popliteal cyst identified though this region is obscured by artifacts. Extensor Mechanism:  Intact quadriceps tendon and patellar tendon. Bones: Faint marrow signal abnormalities along the surgical margin of the tibial shaft amputation. Changes of early osteomyelitis of concern. Other: Extensive stump edema of the surrounding soft tissues.No focal abscess or draining sinuses. IMPRESSION: 1. Minimal marrow signal abnormality noted at the tibial site of amputation. This in conjunction with surrounding cellulitis and edema raises the possibility of early osteomyelitis of the tibial stump. 2. No acute meniscal, cruciate or collateral ligament abnormality. The posterior horn of the medial meniscus is obscured by susceptibility artifacts. Electronically Signed   By: Ashley Royalty M.D.   On: 12/22/2016 19:15   Mr Hip Left Wo Contrast  Result Date: 12/22/2016 CLINICAL DATA:  Two weeks of swelling.  No known trauma. EXAM: MR OF THE LEFT HIP WITHOUT CONTRAST TECHNIQUE: Multiplanar, multisequence MR imaging was performed. No intravenous contrast was administered. COMPARISON:  None. FINDINGS: Bones: No hip fracture, dislocation or avascular necrosis. No periosteal reaction or bone destruction. No aggressive osseous lesion.  Normal sacrum and sacroiliac joints. No SI joint widening or erosive changes. Degenerative disc disease with disc height loss at L4-5 and L5-S1. Articular cartilage and labrum Articular cartilage: High-grade partial-thickness cartilage loss of of the left hip. Partial-thickness cartilage loss of the right hip. Labrum:  Grossly intact, but evaluation is limited by lack of intraarticular fluid. Joint or bursal effusion Joint effusion: Small right hip joint effusion. Small left hip joint effusion. No SI joint effusion. Bursae:  No bursa formation. Muscles and tendons Flexors: Mild muscle edema in the iliacus muscle bilaterally. Extensors:  Mild muscle edema in the right vastus lateralis muscle. Abductors: Normal. Adductors: Mild muscle edema in the right adductor magnus muscle. Gluteals: Muscle edema in the left superior gluteus maximus muscle. Hamstrings: Normal. Other findings Miscellaneous: No pelvic free fluid. No fluid collection or hematoma. No inguinal lymphadenopathy. No inguinal hernia. IMPRESSION: 1. No hip fracture, dislocation or avascular necrosis. 2. Moderate osteoarthritis of the left hip. Mild osteoarthritis of the right hip. 3. Mild muscle strain involving the the right vastus lateralis muscle, right adductor magnus muscle and left superior gluteus maximus muscle. Electronically Signed   By: Kathreen Devoid   On: 12/22/2016 17:39   Dg Knee Complete 4 Views Right  Result Date: 12/22/2016 CLINICAL DATA:  Patient complains of 2 weeks of swelling to right stump, denies any trauma and does not wear prosthetic. Also complains of left hip pain with with movement. States that he is suppose to be scheduled for appointment at wound clinic in future. No fever, NAD. Noticeable green circular scab on bottom of right stump. EXAM: RIGHT KNEE - COMPLETE 4+ VIEW COMPARISON:  None. FINDINGS: Status post below-the-knee amputation. The amputated margins of the remaining tibia and fibula are well-defined. There is no bone resorption to suggest osteomyelitis. No fracture. Bones are demineralized. There is mild medial and lateral femorotibial joint space compartment narrowing. No knee joint effusion. Dense vascular calcifications are noted along the distal superficial femoral artery and popliteal artery. Soft tissue edema is seen overlying  the stump. No soft tissue air. A small metallic foreign body projects within the posterior subcutaneous soft tissues at the level of the knee. IMPRESSION: 1. No fracture.  No evidence of osteomyelitis. 2. Soft tissue swelling overlying the amputated distal tibia and fibula. Electronically Signed   By: Lajean Manes M.D.   On: 12/22/2016 13:19   Dg Hip Unilat With Pelvis 2-3 Views Left  Result Date: 12/22/2016 CLINICAL DATA:  Patient complains of 2 weeks of swelling to right stump, denies any trauma and does not wear prosthetic. Also complains of left hip pain with with movement. States that he is suppose to be scheduled for appointment at wound clinic in future. No fever, NAD. Noticeable green circular scab on bottom of right stump. EXAM: DG HIP (WITH OR WITHOUT PELVIS) 2-3V LEFT COMPARISON:  None. FINDINGS: No fracture.  No bone lesion. The hip joint is normally aligned. There is mild concentric hip joint space narrowing. Small marginal osteophytes project from the inferior femoral head. Bones are demineralized. There is dense calcification along the iliac and femoral vessels. IMPRESSION: 1. No fracture or bone lesion. 2. Mild hip joint arthropathic change. Electronically Signed   By: Lajean Manes M.D.   On: 12/22/2016 13:21    ROS: As per HPI otherwise negative.  Physical Exam: Vitals:   12/22/16 2115 12/22/16 2130 12/22/16 2216 12/23/16 0453  BP: 127/63 120/63 121/63 (!) 104/53  Pulse: 72 74 64 61  Resp: 20 15 16  17  Temp:   97.5 F (36.4 C) 98.4 F (36.9 C)  TempSrc:   Oral Oral  SpO2: 98% 95% 95% 97%  Weight:   68.7 kg (151 lb 6.4 oz)      General: WDWN AAM NAD Head: NCAT sclera not icteric MMM Neck: Supple.  Lungs: CTA bilaterally without wheezes, rales, or rhonchi. Breathing is unlabored. Heart: RRR with S1 S2.  Abdomen: soft NT + BS Lower extremities: right BKA mild edema/erythema, scabbed dry distally very tender; right AKA no edema or open wounds  Neuro: A & O  X 3. Moves all  extremities spontaneously. Psych:  Responds to questions appropriately with a normal affect. Dialysis Access: left lower AVF + bruit  Dialysis Orders: MWF [facility] - #E@ 62 3.5 hours 400/800 2 K 2 Ca profile 4 AVF no heparin venofer 100 x 5 through 4/11 mircera 200 q 2 weeks last 3/21 calcitriol 3 Recent labs:  hgb 11.2 26% sat Ca corr 8.9 P 5.9 iPTH 347  Assessment/Plan: 1.  right BKA - cellulitis with ? Early osteo - [facility] to see; empiric abtx started; pain meds helping 2.  ESRD -  MWF - HD today 3.  Hypertension/volume  - current meds/UF with HD 4.  Anemia  - hgb 10.7 - due for ESA redose Wed- hold on course of Fe for now due to ? osteo 5.  Metabolic bone disease -  Continue calcitriol/renvela/sensipar 6.  Nutrition - renal diet/multivits 7.  PAD/CAD  Myriam Jacobson, PA-C [place] [phone] 12/23/2016, 12:01 PM   Pt seen, examined and agree w A/P as above.  Kelly Splinter MD [employer] pager [phone]   12/23/2016, 12:47 PM

## 2016-12-23 NOTE — Progress Notes (Signed)
PROGRESS NOTE    Randy [name]  OYD:[id number] DOB: 1932-03-14 DOA: 12/22/2016 PCP: [name], MD   Outpatient Specialists:     Brief Narrative:  Randy [name] is a 83 y.o. male with history of ESRD on hemodialysis on Monday Wednesday and Friday, atrial fibrillation, hypertension, CAD presents to the ER because of increasing pain in the right lower extremity around the stump area. Patient has been having this pain for last few weeks. Denies any trauma or fall. Denies any fever or chills.    Assessment & Plan:   Principal Problem:   Amputation stump infection ([place]) Active Problems:   ESRD (end stage renal disease) on dialysis ([place])   Chronic combined systolic and diastolic congestive heart failure (HCC)   Essential hypertension   Osteomyelitis (HCC)   Possible right lower extremity stump cellulitis with developing osteomyelitis  -antibiotics. -vascular consult for possible intervention  ESRD on hemodialysis M/WF -nephrology consult for dialysis  Chronic atrial fibrillation  - not on anticoagulation secondary to GI bleed. Rate control.  Hypertension  -continue amlodipine/ lisinopril/ Imdur /hydralazine.  History of CAD medically managed.  Anemia probably secondary to ESRD  -per renal   DVT prophylaxis:  SQ Heparin  Code Status: Full Code   Family Communication:   Disposition Plan:     Consultants:   Renal  vascular    Subjective: Sore at the bottom of his stump  Objective: Vitals:   12/22/16 2115 12/22/16 2130 12/22/16 2216 12/23/16 0453  BP: 127/63 120/63 121/63 (!) 104/53  Pulse: 72 74 64 61  Resp: 20 15 16 17   Temp:   97.5 F (36.4 C) 98.4 F (36.9 C)  TempSrc:   Oral Oral  SpO2: 98% 95% 95% 97%  Weight:   68.7 kg (151 lb 6.4 oz)     Intake/Output Summary (Last 24 hours) at 12/23/16 1128 Last data filed at 12/23/16 0700  Gross per 24 hour  Intake              270 ml  Output                0 ml  Net              270 ml     Filed Weights   12/22/16 2216  Weight: 68.7 kg (151 lb 6.4 oz)    Examination:      Data Reviewed: I have personally reviewed following labs and imaging studies  CBC:  Recent Labs Lab 12/22/16 1225 12/23/16 0331  WBC 4.6 5.8  NEUTROABS 3.2  --   HGB 11.4* 10.7*  HCT 38.7* 35.9*  MCV 97.2 96.8  PLT 187 672   Basic Metabolic Panel:  Recent Labs Lab 12/22/16 1225 12/23/16 0331  NA 136 138  K 4.5 4.7  CL 97* 96*  CO2 26 26  GLUCOSE 138* 120*  BUN 53* 62*  CREATININE 8.78* 9.75*  CALCIUM 7.9* 7.7*   GFR: Estimated Creatinine Clearance: 5 mL/min (A) (by C-G formula based on SCr of 9.75 mg/dL (H)). Liver Function Tests:  Recent Labs Lab 12/22/16 1225  AST 14*  ALT 11*  ALKPHOS 74  BILITOT 0.5  PROT 6.1*  ALBUMIN 3.0*   No results for input(s): LIPASE, AMYLASE in the last 168 hours. No results for input(s): AMMONIA in the last 168 hours. Coagulation Profile:  Recent Labs Lab 12/22/16 1225  INR 1.05   Cardiac Enzymes: No results for input(s): CKTOTAL, CKMB, CKMBINDEX, TROPONINI in the last 168 hours.  BNP (last 3 results) No results for input(s): PROBNP in the last 8760 hours. HbA1C: No results for input(s): HGBA1C in the last 72 hours. CBG: No results for input(s): GLUCAP in the last 168 hours. Lipid Profile: No results for input(s): CHOL, HDL, LDLCALC, TRIG, CHOLHDL, LDLDIRECT in the last 72 hours. Thyroid Function Tests: No results for input(s): TSH, T4TOTAL, FREET4, T3FREE, THYROIDAB in the last 72 hours. Anemia Panel: No results for input(s): VITAMINB12, FOLATE, FERRITIN, TIBC, IRON, RETICCTPCT in the last 72 hours. Urine analysis:    Component Value Date/Time   COLORURINE YELLOW 06/22/2014 0531   APPEARANCEUR CLEAR 06/22/2014 0531   LABSPEC 1.015 06/22/2014 0531   PHURINE 6.0 06/22/2014 0531   GLUCOSEU 100 (A) 06/22/2014 0531   HGBUR SMALL (A) 06/22/2014 0531   HGBUR trace-lysed 06/03/2008 0855   BILIRUBINUR NEGATIVE 06/22/2014  0531   KETONESUR TRACE (A) 06/22/2014 0531   PROTEINUR 100 (A) 06/22/2014 0531   UROBILINOGEN 0.2 06/22/2014 0531   NITRITE NEGATIVE 06/22/2014 0531   LEUKOCYTESUR NEGATIVE 06/22/2014 0531    ) Recent Results (from the past 240 hour(s))  MRSA PCR Screening     Status: None   Collection Time: 12/22/16 10:47 PM  Result Value Ref Range Status   MRSA by PCR NEGATIVE NEGATIVE Final    Comment:        The GeneXpert MRSA Assay (FDA approved for NASAL specimens only), is one component of a comprehensive MRSA colonization surveillance program. It is not intended to diagnose MRSA infection nor to guide or monitor treatment for MRSA infections.       Anti-infectives    Start     Dose/Rate Route Frequency Ordered Stop   12/23/16 1200  vancomycin (VANCOCIN) IVPB 750 mg/150 ml premix     750 mg 150 mL/hr over 60 Minutes Intravenous Every M-W-F (Hemodialysis) 12/23/16 0929     12/23/16 0600  piperacillin-tazobactam (ZOSYN) IVPB 3.375 g     3.375 g 12.5 mL/hr over 240 Minutes Intravenous Every 12 hours 12/22/16 2045     12/22/16 2030  vancomycin (VANCOCIN) IVPB 1000 mg/200 mL premix     1,000 mg 200 mL/hr over 60 Minutes Intravenous  Once 12/22/16 2025 12/22/16 2132   12/22/16 2030  piperacillin-tazobactam (ZOSYN) IVPB 3.375 g     3.375 g 100 mL/hr over 30 Minutes Intravenous  Once 12/22/16 2025 12/22/16 2102       Radiology Studies: Mr Knee Right Wo Contrast  Result Date: 12/22/2016 CLINICAL DATA:  Pain and swelling of the amputation stump of the right lower extremity x2 weeks. No fever. EXAM: MRI OF THE RIGHT KNEE WITHOUT CONTRAST TECHNIQUE: Multiplanar, multisequence MR imaging of the knee was performed. No intravenous contrast was administered. COMPARISON:  Plain radiographs from 12/22/2016 FINDINGS: Susceptibility artifact is noted emanating from the posteromedial aspect of the stump. This slightly limits assessment. MENISCI Medial meniscus: Limited assessment of the posterior  horn due to susceptibility artifacts adjacent to the posterior horn. No Randy tear identified. Lateral meniscus:  Intact LIGAMENTS Cruciates:  Intact Collaterals: Medial collateral ligament is intact. Lateral collateral ligament complex is intact. CARTILAGE Patellofemoral: Mild chondromalacia of the lateral patellar cartilage with slight thinning of the trochlear cartilage suggested. Medial:  Irregular femorotibial partial-thickness cartilage loss. Lateral:  No chondral defect Joint:  No joint effusion. Normal Hoffa's fat. No plical thickening. Popliteal Fossa: Limited by susceptibility artifacts. No definite popliteal cyst identified though this region is obscured by artifacts. Extensor Mechanism:  Intact quadriceps tendon and patellar tendon. Bones: Faint  marrow signal abnormalities along the surgical margin of the tibial shaft amputation. Changes of early osteomyelitis of concern. Other: Extensive stump edema of the surrounding soft tissues.No focal abscess or draining sinuses. IMPRESSION: 1. Minimal marrow signal abnormality noted at the tibial site of amputation. This in conjunction with surrounding cellulitis and edema raises the possibility of early osteomyelitis of the tibial stump. 2. No acute meniscal, cruciate or collateral ligament abnormality. The posterior horn of the medial meniscus is obscured by susceptibility artifacts. Electronically Signed   By: Ashley Royalty M.D.   On: 12/22/2016 19:15   Mr Hip Left Wo Contrast  Result Date: 12/22/2016 CLINICAL DATA:  Two weeks of swelling.  No known trauma. EXAM: MR OF THE LEFT HIP WITHOUT CONTRAST TECHNIQUE: Multiplanar, multisequence MR imaging was performed. No intravenous contrast was administered. COMPARISON:  None. FINDINGS: Bones: No hip fracture, dislocation or avascular necrosis. No periosteal reaction or bone destruction. No aggressive osseous lesion. Normal sacrum and sacroiliac joints. No SI joint widening or erosive changes. Degenerative disc  disease with disc height loss at L4-5 and L5-S1. Articular cartilage and labrum Articular cartilage: High-grade partial-thickness cartilage loss of of the left hip. Partial-thickness cartilage loss of the right hip. Labrum: Grossly intact, but evaluation is limited by lack of intraarticular fluid. Joint or bursal effusion Joint effusion: Small right hip joint effusion. Small left hip joint effusion. No SI joint effusion. Bursae:  No bursa formation. Muscles and tendons Flexors: Mild muscle edema in the iliacus muscle bilaterally. Extensors:  Mild muscle edema in the right vastus lateralis muscle. Abductors: Normal. Adductors: Mild muscle edema in the right adductor magnus muscle. Gluteals: Muscle edema in the left superior gluteus maximus muscle. Hamstrings: Normal. Other findings Miscellaneous: No pelvic free fluid. No fluid collection or hematoma. No inguinal lymphadenopathy. No inguinal hernia. IMPRESSION: 1. No hip fracture, dislocation or avascular necrosis. 2. Moderate osteoarthritis of the left hip. Mild osteoarthritis of the right hip. 3. Mild muscle strain involving the the right vastus lateralis muscle, right adductor magnus muscle and left superior gluteus maximus muscle. Electronically Signed   By: Kathreen Devoid   On: 12/22/2016 17:39   Dg Knee Complete 4 Views Right  Result Date: 12/22/2016 CLINICAL DATA:  Patient complains of 2 weeks of swelling to right stump, denies any trauma and does not wear prosthetic. Also complains of left hip pain with with movement. States that he is suppose to be scheduled for appointment at wound clinic in future. No fever, NAD. Noticeable green circular scab on bottom of right stump. EXAM: RIGHT KNEE - COMPLETE 4+ VIEW COMPARISON:  None. FINDINGS: Status post below-the-knee amputation. The amputated margins of the remaining tibia and fibula are well-defined. There is no bone resorption to suggest osteomyelitis. No fracture. Bones are demineralized. There is mild medial  and lateral femorotibial joint space compartment narrowing. No knee joint effusion. Dense vascular calcifications are noted along the distal superficial femoral artery and popliteal artery. Soft tissue edema is seen overlying the stump. No soft tissue air. A small metallic foreign body projects within the posterior subcutaneous soft tissues at the level of the knee. IMPRESSION: 1. No fracture.  No evidence of osteomyelitis. 2. Soft tissue swelling overlying the amputated distal tibia and fibula. Electronically Signed   By: Lajean Manes M.D.   On: 12/22/2016 13:19   Dg Hip Unilat With Pelvis 2-3 Views Left  Result Date: 12/22/2016 CLINICAL DATA:  Patient complains of 2 weeks of swelling to right stump, denies any trauma and does  not wear prosthetic. Also complains of left hip pain with with movement. States that he is suppose to be scheduled for appointment at wound clinic in future. No fever, NAD. Noticeable green circular scab on bottom of right stump. EXAM: DG HIP (WITH OR WITHOUT PELVIS) 2-3V LEFT COMPARISON:  None. FINDINGS: No fracture.  No bone lesion. The hip joint is normally aligned. There is mild concentric hip joint space narrowing. Small marginal osteophytes project from the inferior femoral head. Bones are demineralized. There is dense calcification along the iliac and femoral vessels. IMPRESSION: 1. No fracture or bone lesion. 2. Mild hip joint arthropathic change. Electronically Signed   By: Lajean Manes M.D.   On: 12/22/2016 13:21        Scheduled Meds: . amLODipine  5 mg Oral Daily  . atorvastatin  80 mg Oral QPC supper  . brimonidine  1 drop Both Eyes BID  . calcitRIOL  3 mcg Oral Q M,W,F-HD  . feeding supplement  1 Container Oral Daily  . ferric gluconate (FERRLECIT/NULECIT) IV  125 mg Intravenous Q M,W,F-HD  . gabapentin  300 mg Oral QHS  . heparin  5,000 Units Subcutaneous Q8H  . isosorbide mononitrate  15 mg Oral QHS  . lisinopril  40 mg Oral Daily  . pantoprazole  40 mg  Oral Q1200  . piperacillin-tazobactam (ZOSYN)  IV  3.375 g Intravenous Q12H  . sevelamer carbonate  2,400 mg Oral TID WC  . timolol  1 drop Both Eyes BID  . vancomycin  750 mg Intravenous Q M,W,F-HD   Continuous Infusions:   LOS: 0 days       [place], DO [facility] Pager [phone]  If 7PM-7AM, please contact night-coverage [email or web address] Password [facility] 12/23/2016, 11:28 AM

## 2016-12-24 DIAGNOSIS — I10 Essential (primary) hypertension: Secondary | ICD-10-CM

## 2016-12-24 DIAGNOSIS — I5042 Chronic combined systolic (congestive) and diastolic (congestive) heart failure: Secondary | ICD-10-CM

## 2016-12-24 MED ORDER — DEXTROSE 5 % IV SOLN: 2.0000 g | INTRAVENOUS | Status: DC

## 2016-12-24 MED ORDER — VANCOMYCIN HCL IN DEXTROSE 750-5 MG/150ML-% IV SOLN: 750.0000 mg | INTRAVENOUS | Status: DC

## 2016-12-24 MED ORDER — HYDROCODONE-ACETAMINOPHEN 5-325 MG PO TABS: 1.0000 | ORAL_TABLET | ORAL | 0 refills | Status: DC | PRN

## 2016-12-24 MED ORDER — CEFTAZIDIME 1 G IJ SOLR
1.0000 g | Freq: Once | INTRAMUSCULAR | Status: DC
  Filled 2016-12-24: qty 1

## 2016-12-24 NOTE — Progress Notes (Signed)
D/C papers gone over with pt. IV taken out. Pain prescription given to pt. No questions/complaints. Pt. D/c'd successfully via w/c.

## 2016-12-24 NOTE — Progress Notes (Signed)
Pharmacy Antibiotic Note Randy [name] is a 83 y.o. male admitted on 12/22/2016 with osteomyelitis of right tibial stump in setting of ESRD. Pt started on vancomycin and now changing zosyn to ceftazidime for osteomyelitis. Pt is afebrile and WBC is WNL.   Plan: Continue vanc 750mg  post-HD MWF Ceftazidime 1gm IV x 1 today then 2gm post-HD MWF F/u renal plans, C&S, clinical status and pre-HD vanc level when appropriate  Temp (24hrs), Avg:98.2 F (36.8 C), Min:98 F (36.7 C), Max:98.5 F (36.9 C)   Recent Labs Lab 12/22/16 1225 12/22/16 1240 12/23/16 0331  WBC 4.6  --  5.8  CREATININE 8.78*  --  9.75*  LATICACIDVEN  --  0.97  --     Estimated Creatinine Clearance: 5 mL/min (A) (by C-G formula based on SCr of 9.75 mg/dL (H)).    No Known Allergies  Antimicrobials this admission: Vanc 4/1>> Zosyn 4/1>>4/3 Ceftaz 4/3>>  Thank you for allowing pharmacy to be a part of this patient's care.  Salome Arnt, PharmD, BCPS Pager # [phone] 12/24/2016 11:07 AM

## 2016-12-24 NOTE — Progress Notes (Signed)
[name] Progress Note   Subjective: doesn't want R AKA now, wants to see if wound clinic can do something  Vitals:   12/23/16 2100 12/24/16 0500 12/24/16 0513 12/24/16 1109  BP: (!) 107/48  (!) 116/51 120/68  Pulse: 80  62   Resp: 18  17   Temp: 98.5 F (36.9 C)  98.4 F (36.9 C)   TempSrc: Oral  Oral   SpO2: 94%  97%   Weight:  66.1 kg (145 lb 11.6 oz)      Inpatient medications: . amLODipine  5 mg Oral Daily  . atorvastatin  80 mg Oral QPC supper  . brimonidine  1 drop Both Eyes BID  . calcitRIOL  3 mcg Oral Q M,W,F-HD  . cefTAZidime (FORTAZ)  IV  1 g Intravenous Once  . [START ON 12/25/2016] cefTAZidime (FORTAZ)  IV  2 g Intravenous Q M,W,F-2000  . cinacalcet  60 mg Oral Q supper  . [START ON 12/25/2016] darbepoetin (ARANESP) injection - DIALYSIS  200 mcg Intravenous Q Wed-HD  . feeding supplement  1 Container Oral Daily  . gabapentin  300 mg Oral QHS  . heparin  5,000 Units Subcutaneous Q8H  . isosorbide mononitrate  15 mg Oral QHS  . lisinopril  40 mg Oral Daily  . pantoprazole  40 mg Oral Q1200  . sevelamer carbonate  2,400 mg Oral TID WC  . timolol  1 drop Both Eyes BID  . vancomycin  750 mg Intravenous Q M,W,F-HD    acetaminophen **OR** acetaminophen, hydrALAZINE, HYDROcodone-acetaminophen, nitroGLYCERIN, ondansetron **OR** ondansetron (ZOFRAN) IV, sevelamer carbonate  Exam: Alert, no distress Chest clear bilat RRR no mrg Abd soft ntnd Ext R BKA small eschar and surroudning skin darkening middle of stump, tender , not red L AKA no edema or erythema Ox3, NF LFA AVF+bruit  Dialysis: MWF [facility] - #E@ 62 3.5 hours 400/800 2 K 2 Ca profile 4 AVF no heparin venofer 100 x 5 through 4/11 mircera 200 q 2 weeks last 3/21 calcitriol 3 Recent labs:  hgb 11.2 26% sat Ca corr 8.9 P 5.9 iPTH 347      Assessment: 1.  right BKA - cellulitis and osteo per MRI.  Pt doesn't want any amputation at this point, see [facility] notes.  Wants to go home; ok for dc from  our standpoint, can give IV abx in OP HD setting as needed.  Will d/w primary.   2.  ESRD -  MWF HD 3.  Hypertension/volume  - current meds/UF with HD 4.  Anemia  - hgb 10.7 - due for ESA redose Wed- hold on course of Fe for now due to ? osteo 5.  Metabolic bone disease -  Continue calcitriol/renvela/sensipar 6.  Nutrition - renal diet/multivits 7.  PAD/CAD  Plan - ok for dc today, will have HD 2nd shift tomorrow then resume 1st shift, have dw OP HD and w/ patient.    Kelly Splinter MD [facility] pager [phone]   12/24/2016, 12:40 PM    Recent Labs Lab 12/22/16 1225 12/23/16 0331  NA 136 138  K 4.5 4.7  CL 97* 96*  CO2 26 26  GLUCOSE 138* 120*  BUN 53* 62*  CREATININE 8.78* 9.75*  CALCIUM 7.9* 7.7*    Recent Labs Lab 12/22/16 1225  AST 14*  ALT 11*  ALKPHOS 74  BILITOT 0.5  PROT 6.1*  ALBUMIN 3.0*    Recent Labs Lab 12/22/16 1225 12/23/16 0331  WBC 4.6 5.8  NEUTROABS 3.2  --  HGB 11.4* 10.7*  HCT 38.7* 35.9*  MCV 97.2 96.8  PLT 187 166   Iron/TIBC/Ferritin/ %Sat    Component Value Date/Time   IRON 24 (L) 10/25/2015 1628   TIBC 113 (L) 10/25/2015 1628   FERRITIN 1,242 (H) 10/25/2015 1628   IRONPCTSAT 21 10/25/2015 1628

## 2016-12-24 NOTE — Progress Notes (Signed)
PROGRESS NOTE    Randy [name]  BMW:[id number] DOB: 05-Nov-1931 DOA: 12/22/2016 PCP: [name], MD   Outpatient Specialists:     Brief Narrative:  Randy [name] is a 83 y.o. male with history of ESRD on hemodialysis on Monday Wednesday and Friday, atrial fibrillation, hypertension, CAD presents to the ER because of increasing pain in the right lower extremity around the stump area. Patient has been having this pain for last few weeks. Denies any trauma or fall. Denies any fever or chills.    Assessment & Plan:   Principal Problem:   Amputation stump infection ([place]) Active Problems:   ESRD (end stage renal disease) on dialysis (HCC)   Chronic combined systolic and diastolic congestive heart failure (HCC)   Essential hypertension   Osteomyelitis (HCC)   Possible right lower extremity stump cellulitis with developing osteomyelitis  -antibiotics-- vanc/zosyn -vascular consult- patient wishes to avoid surgery if possible  ESRD on hemodialysis M/WF -nephrology consult for dialysis- last dialysis Monday  Chronic atrial fibrillation  - not on anticoagulation secondary to GI bleed. Rate control.  Hypertension  -continue amlodipine/ lisinopril/ Imdur /hydralazine.  History of CAD medically managed.  Anemia probably secondary to ESRD  -per renal   DVT prophylaxis:  SQ Heparin  Code Status: Full Code   Family Communication:   Disposition Plan:     Consultants:   Renal  vascular    Subjective: Patient refusing renal diet-- says he does not follow a diet at home-- wants [place] sausage.    Objective: Vitals:   12/23/16 1809 12/23/16 2100 12/24/16 0500 12/24/16 0513  BP: (!) 160/78 (!) 107/48  (!) 116/51  Pulse: 84 80  62  Resp: 20 18  17   Temp: 98 F (36.7 C) 98.5 F (36.9 C)  98.4 F (36.9 C)  TempSrc: Oral Oral  Oral  SpO2: 98% 94%  97%  Weight: 63.3 kg (139 lb 8.8 oz)  66.1 kg (145 lb 11.6 oz)     Intake/Output Summary (Last 24 hours)  at 12/24/16 0835 Last data filed at 12/24/16 0509  Gross per 24 hour  Intake              100 ml  Output             2500 ml  Net            -2400 ml   Filed Weights   12/23/16 1439 12/23/16 1809 12/24/16 0500  Weight: 65.8 kg (145 lb 1 oz) 63.3 kg (139 lb 8.8 oz) 66.1 kg (145 lb 11.6 oz)    Examination:   A+Ox3, NAD Clear irr right stump tender with echar   Data Reviewed: I have personally reviewed following labs and imaging studies  CBC:  Recent Labs Lab 12/22/16 1225 12/23/16 0331  WBC 4.6 5.8  NEUTROABS 3.2  --   HGB 11.4* 10.7*  HCT 38.7* 35.9*  MCV 97.2 96.8  PLT 187 272   Basic Metabolic Panel:  Recent Labs Lab 12/22/16 1225 12/23/16 0331  NA 136 138  K 4.5 4.7  CL 97* 96*  CO2 26 26  GLUCOSE 138* 120*  BUN 53* 62*  CREATININE 8.78* 9.75*  CALCIUM 7.9* 7.7*   GFR: Estimated Creatinine Clearance: 5 mL/min (A) (by C-G formula based on SCr of 9.75 mg/dL (H)). Liver Function Tests:  Recent Labs Lab 12/22/16 1225  AST 14*  ALT 11*  ALKPHOS 74  BILITOT 0.5  PROT 6.1*  ALBUMIN 3.0*   No results  for input(s): LIPASE, AMYLASE in the last 168 hours. No results for input(s): AMMONIA in the last 168 hours. Coagulation Profile:  Recent Labs Lab 12/22/16 1225  INR 1.05   Cardiac Enzymes: No results for input(s): CKTOTAL, CKMB, CKMBINDEX, TROPONINI in the last 168 hours. BNP (last 3 results) No results for input(s): PROBNP in the last 8760 hours. HbA1C: No results for input(s): HGBA1C in the last 72 hours. CBG: No results for input(s): GLUCAP in the last 168 hours. Lipid Profile: No results for input(s): CHOL, HDL, LDLCALC, TRIG, CHOLHDL, LDLDIRECT in the last 72 hours. Thyroid Function Tests: No results for input(s): TSH, T4TOTAL, FREET4, T3FREE, THYROIDAB in the last 72 hours. Anemia Panel: No results for input(s): VITAMINB12, FOLATE, FERRITIN, TIBC, IRON, RETICCTPCT in the last 72 hours. Urine analysis:    Component Value Date/Time     COLORURINE YELLOW 06/22/2014 0531   APPEARANCEUR CLEAR 06/22/2014 0531   LABSPEC 1.015 06/22/2014 0531   PHURINE 6.0 06/22/2014 0531   GLUCOSEU 100 (A) 06/22/2014 0531   HGBUR SMALL (A) 06/22/2014 0531   HGBUR trace-lysed 06/03/2008 0855   BILIRUBINUR NEGATIVE 06/22/2014 0531   KETONESUR TRACE (A) 06/22/2014 0531   PROTEINUR 100 (A) 06/22/2014 0531   UROBILINOGEN 0.2 06/22/2014 0531   NITRITE NEGATIVE 06/22/2014 0531   LEUKOCYTESUR NEGATIVE 06/22/2014 0531    ) Recent Results (from the past 240 hour(s))  MRSA PCR Screening     Status: None   Collection Time: 12/22/16 10:47 PM  Result Value Ref Range Status   MRSA by PCR NEGATIVE NEGATIVE Final    Comment:        The GeneXpert MRSA Assay (FDA approved for NASAL specimens only), is one component of a comprehensive MRSA colonization surveillance program. It is not intended to diagnose MRSA infection nor to guide or monitor treatment for MRSA infections.       Anti-infectives    Start     Dose/Rate Route Frequency Ordered Stop   12/23/16 1555  Vancomycin (VANCOCIN) 750-5 MG/150ML-% IVPB    Comments:  Cherylann Banas   : cabinet override      12/23/16 1555 12/23/16 1704   12/23/16 1200  vancomycin (VANCOCIN) IVPB 750 mg/150 ml premix     750 mg 150 mL/hr over 60 Minutes Intravenous Every M-W-F (Hemodialysis) 12/23/16 0929     12/23/16 0600  piperacillin-tazobactam (ZOSYN) IVPB 3.375 g     3.375 g 12.5 mL/hr over 240 Minutes Intravenous Every 12 hours 12/22/16 2045     12/22/16 2030  vancomycin (VANCOCIN) IVPB 1000 mg/200 mL premix     1,000 mg 200 mL/hr over 60 Minutes Intravenous  Once 12/22/16 2025 12/22/16 2132   12/22/16 2030  piperacillin-tazobactam (ZOSYN) IVPB 3.375 g     3.375 g 100 mL/hr over 30 Minutes Intravenous  Once 12/22/16 2025 12/22/16 2102       Radiology Studies: Mr Knee Right Wo Contrast  Result Date: 12/22/2016 CLINICAL DATA:  Pain and swelling of the amputation stump of the right lower  extremity x2 weeks. No fever. EXAM: MRI OF THE RIGHT KNEE WITHOUT CONTRAST TECHNIQUE: Multiplanar, multisequence MR imaging of the knee was performed. No intravenous contrast was administered. COMPARISON:  Plain radiographs from 12/22/2016 FINDINGS: Susceptibility artifact is noted emanating from the posteromedial aspect of the stump. This slightly limits assessment. MENISCI Medial meniscus: Limited assessment of the posterior horn due to susceptibility artifacts adjacent to the posterior horn. No frank tear identified. Lateral meniscus:  Intact LIGAMENTS Cruciates:  Intact Collaterals: Medial  collateral ligament is intact. Lateral collateral ligament complex is intact. CARTILAGE Patellofemoral: Mild chondromalacia of the lateral patellar cartilage with slight thinning of the trochlear cartilage suggested. Medial:  Irregular femorotibial partial-thickness cartilage loss. Lateral:  No chondral defect Joint:  No joint effusion. Normal Hoffa's fat. No plical thickening. Popliteal Fossa: Limited by susceptibility artifacts. No definite popliteal cyst identified though this region is obscured by artifacts. Extensor Mechanism:  Intact quadriceps tendon and patellar tendon. Bones: Faint marrow signal abnormalities along the surgical margin of the tibial shaft amputation. Changes of early osteomyelitis of concern. Other: Extensive stump edema of the surrounding soft tissues.No focal abscess or draining sinuses. IMPRESSION: 1. Minimal marrow signal abnormality noted at the tibial site of amputation. This in conjunction with surrounding cellulitis and edema raises the possibility of early osteomyelitis of the tibial stump. 2. No acute meniscal, cruciate or collateral ligament abnormality. The posterior horn of the medial meniscus is obscured by susceptibility artifacts. Electronically Signed   By: Ashley Royalty M.D.   On: 12/22/2016 19:15   Mr Hip Left Wo Contrast  Result Date: 12/22/2016 CLINICAL DATA:  Two weeks of  swelling.  No known trauma. EXAM: MR OF THE LEFT HIP WITHOUT CONTRAST TECHNIQUE: Multiplanar, multisequence MR imaging was performed. No intravenous contrast was administered. COMPARISON:  None. FINDINGS: Bones: No hip fracture, dislocation or avascular necrosis. No periosteal reaction or bone destruction. No aggressive osseous lesion. Normal sacrum and sacroiliac joints. No SI joint widening or erosive changes. Degenerative disc disease with disc height loss at L4-5 and L5-S1. Articular cartilage and labrum Articular cartilage: High-grade partial-thickness cartilage loss of of the left hip. Partial-thickness cartilage loss of the right hip. Labrum: Grossly intact, but evaluation is limited by lack of intraarticular fluid. Joint or bursal effusion Joint effusion: Small right hip joint effusion. Small left hip joint effusion. No SI joint effusion. Bursae:  No bursa formation. Muscles and tendons Flexors: Mild muscle edema in the iliacus muscle bilaterally. Extensors:  Mild muscle edema in the right vastus lateralis muscle. Abductors: Normal. Adductors: Mild muscle edema in the right adductor magnus muscle. Gluteals: Muscle edema in the left superior gluteus maximus muscle. Hamstrings: Normal. Other findings Miscellaneous: No pelvic free fluid. No fluid collection or hematoma. No inguinal lymphadenopathy. No inguinal hernia. IMPRESSION: 1. No hip fracture, dislocation or avascular necrosis. 2. Moderate osteoarthritis of the left hip. Mild osteoarthritis of the right hip. 3. Mild muscle strain involving the the right vastus lateralis muscle, right adductor magnus muscle and left superior gluteus maximus muscle. Electronically Signed   By: Kathreen Devoid   On: 12/22/2016 17:39   Dg Knee Complete 4 Views Right  Result Date: 12/22/2016 CLINICAL DATA:  Patient complains of 2 weeks of swelling to right stump, denies any trauma and does not wear prosthetic. Also complains of left hip pain with with movement. States that he  is suppose to be scheduled for appointment at wound clinic in future. No fever, NAD. Noticeable green circular scab on bottom of right stump. EXAM: RIGHT KNEE - COMPLETE 4+ VIEW COMPARISON:  None. FINDINGS: Status post below-the-knee amputation. The amputated margins of the remaining tibia and fibula are well-defined. There is no bone resorption to suggest osteomyelitis. No fracture. Bones are demineralized. There is mild medial and lateral femorotibial joint space compartment narrowing. No knee joint effusion. Dense vascular calcifications are noted along the distal superficial femoral artery and popliteal artery. Soft tissue edema is seen overlying the stump. No soft tissue air. A small metallic foreign body  projects within the posterior subcutaneous soft tissues at the level of the knee. IMPRESSION: 1. No fracture.  No evidence of osteomyelitis. 2. Soft tissue swelling overlying the amputated distal tibia and fibula. Electronically Signed   By: Lajean Manes M.D.   On: 12/22/2016 13:19   Dg Hip Unilat With Pelvis 2-3 Views Left  Result Date: 12/22/2016 CLINICAL DATA:  Patient complains of 2 weeks of swelling to right stump, denies any trauma and does not wear prosthetic. Also complains of left hip pain with with movement. States that he is suppose to be scheduled for appointment at wound clinic in future. No fever, NAD. Noticeable green circular scab on bottom of right stump. EXAM: DG HIP (WITH OR WITHOUT PELVIS) 2-3V LEFT COMPARISON:  None. FINDINGS: No fracture.  No bone lesion. The hip joint is normally aligned. There is mild concentric hip joint space narrowing. Small marginal osteophytes project from the inferior femoral head. Bones are demineralized. There is dense calcification along the iliac and femoral vessels. IMPRESSION: 1. No fracture or bone lesion. 2. Mild hip joint arthropathic change. Electronically Signed   By: Lajean Manes M.D.   On: 12/22/2016 13:21        Scheduled Meds: .  amLODipine  5 mg Oral Daily  . atorvastatin  80 mg Oral QPC supper  . brimonidine  1 drop Both Eyes BID  . calcitRIOL  3 mcg Oral Q M,W,F-HD  . cinacalcet  60 mg Oral Q supper  . [START ON 12/25/2016] darbepoetin (ARANESP) injection - DIALYSIS  200 mcg Intravenous Q Wed-HD  . feeding supplement  1 Container Oral Daily  . gabapentin  300 mg Oral QHS  . heparin  5,000 Units Subcutaneous Q8H  . isosorbide mononitrate  15 mg Oral QHS  . lisinopril  40 mg Oral Daily  . pantoprazole  40 mg Oral Q1200  . piperacillin-tazobactam (ZOSYN)  IV  3.375 g Intravenous Q12H  . sevelamer carbonate  2,400 mg Oral TID WC  . timolol  1 drop Both Eyes BID  . vancomycin  750 mg Intravenous Q M,W,F-HD   Continuous Infusions:   LOS: 1 day       [place], DO [facility] Pager [phone]  If 7PM-7AM, please contact night-coverage [email or web address] Password [facility] 12/24/2016, 8:35 AM

## 2016-12-24 NOTE — Discharge Summary (Signed)
Physician Discharge Summary  Randy [name]:[id number] DOB: Oct 28, 1931 DOA: 12/22/2016  PCP: [name], MD  Admit date: 12/22/2016 Discharge date: 12/24/2016   Recommendations for Outpatient Follow-Up:   IV abx with dialysis for 6 weeks for osteomyelitis Close follow up with vascular-- recommending AKA but patient hesitant  Discharge Diagnosis:   Principal Problem:   Amputation stump infection ([place]) Active Problems:   ESRD (end stage renal disease) on dialysis ([place])   Chronic combined systolic and diastolic congestive heart failure ([place])   Essential hypertension   Osteomyelitis ([place])   Discharge disposition:  Home.  Discharge Condition: Improved.  Diet recommendation: renal  Wound care: None.   History of Present Illness:   Randy [name] is a 83 y.o. male with history of ESRD on hemodialysis on Monday Wednesday and Friday, atrial fibrillation, hypertension, CAD presents to the ER because of increasing pain in the right lower extremity around the stump area. Patient has been having this pain for last few weeks. Denies any trauma or fall. Denies any fever or chills.   ED Course: MRI done in the ER shows possible cellulitis with early developing osteomyelitis. He had physician had discussed with on-call vascular surgeon Dr. Bridgett Larsson who advised admission and consult Dr. Scot Dock patient's vascular surgeon in a.m.   Hospital Course by Problem:   Possible right lower extremity stump cellulitis with developing osteomyelitis  -antibiotics--changed to vanc/ceftaz to be given with dialysis x 6 weeks per ID phone consult -vascular consult- patient wishes to avoid surgery-- they will follow up as an outpatient  ESRD on hemodialysis M/WF -nephrology consult for dialysis- last dialysis Monday  Chronic atrial fibrillation  - not on anticoagulation secondary to GI bleed. Rate control.  Hypertension  -continue amlodipine/ lisinopril/ Imdur /hydralazine.  History of  CAD medically managed.  Anemia probably secondary to ESRD  -per renal    Medical Consultants:    Renal  vascular   Discharge Exam:   Vitals:   12/24/16 0513 12/24/16 1109  BP: (!) 116/51 120/68  Pulse: 62   Resp: 17   Temp: 98.4 F (36.9 C)    Vitals:   12/23/16 2100 12/24/16 0500 12/24/16 0513 12/24/16 1109  BP: (!) 107/48  (!) 116/51 120/68  Pulse: 80  62   Resp: 18  17   Temp: 98.5 F (36.9 C)  98.4 F (36.9 C)   TempSrc: Oral  Oral   SpO2: 94%  97%   Weight:  66.1 kg (145 lb 11.6 oz)      Gen:  NAD   The results of significant diagnostics from this hospitalization (including imaging, microbiology, ancillary and laboratory) are listed below for reference.     Procedures and Diagnostic Studies:   Mr Knee Right Wo Contrast  Result Date: 12/22/2016 CLINICAL DATA:  Pain and swelling of the amputation stump of the right lower extremity x2 weeks. No fever. EXAM: MRI OF THE RIGHT KNEE WITHOUT CONTRAST TECHNIQUE: Multiplanar, multisequence MR imaging of the knee was performed. No intravenous contrast was administered. COMPARISON:  Plain radiographs from 12/22/2016 FINDINGS: Susceptibility artifact is noted emanating from the posteromedial aspect of the stump. This slightly limits assessment. MENISCI Medial meniscus: Limited assessment of the posterior horn due to susceptibility artifacts adjacent to the posterior horn. No frank tear identified. Lateral meniscus:  Intact LIGAMENTS Cruciates:  Intact Collaterals: Medial collateral ligament is intact. Lateral collateral ligament complex is intact. CARTILAGE Patellofemoral: Mild chondromalacia of the lateral patellar cartilage with slight thinning of the trochlear cartilage  suggested. Medial:  Irregular femorotibial partial-thickness cartilage loss. Lateral:  No chondral defect Joint:  No joint effusion. Normal Hoffa's fat. No plical thickening. Popliteal Fossa: Limited by susceptibility artifacts. No definite popliteal cyst  identified though this region is obscured by artifacts. Extensor Mechanism:  Intact quadriceps tendon and patellar tendon. Bones: Faint marrow signal abnormalities along the surgical margin of the tibial shaft amputation. Changes of early osteomyelitis of concern. Other: Extensive stump edema of the surrounding soft tissues.No focal abscess or draining sinuses. IMPRESSION: 1. Minimal marrow signal abnormality noted at the tibial site of amputation. This in conjunction with surrounding cellulitis and edema raises the possibility of early osteomyelitis of the tibial stump. 2. No acute meniscal, cruciate or collateral ligament abnormality. The posterior horn of the medial meniscus is obscured by susceptibility artifacts. Electronically Signed   By: Ashley Royalty M.D.   On: 12/22/2016 19:15   Mr Hip Left Wo Contrast  Result Date: 12/22/2016 CLINICAL DATA:  Two weeks of swelling.  No known trauma. EXAM: MR OF THE LEFT HIP WITHOUT CONTRAST TECHNIQUE: Multiplanar, multisequence MR imaging was performed. No intravenous contrast was administered. COMPARISON:  None. FINDINGS: Bones: No hip fracture, dislocation or avascular necrosis. No periosteal reaction or bone destruction. No aggressive osseous lesion. Normal sacrum and sacroiliac joints. No SI joint widening or erosive changes. Degenerative disc disease with disc height loss at L4-5 and L5-S1. Articular cartilage and labrum Articular cartilage: High-grade partial-thickness cartilage loss of of the left hip. Partial-thickness cartilage loss of the right hip. Labrum: Grossly intact, but evaluation is limited by lack of intraarticular fluid. Joint or bursal effusion Joint effusion: Small right hip joint effusion. Small left hip joint effusion. No SI joint effusion. Bursae:  No bursa formation. Muscles and tendons Flexors: Mild muscle edema in the iliacus muscle bilaterally. Extensors:  Mild muscle edema in the right vastus lateralis muscle. Abductors: Normal. Adductors:  Mild muscle edema in the right adductor magnus muscle. Gluteals: Muscle edema in the left superior gluteus maximus muscle. Hamstrings: Normal. Other findings Miscellaneous: No pelvic free fluid. No fluid collection or hematoma. No inguinal lymphadenopathy. No inguinal hernia. IMPRESSION: 1. No hip fracture, dislocation or avascular necrosis. 2. Moderate osteoarthritis of the left hip. Mild osteoarthritis of the right hip. 3. Mild muscle strain involving the the right vastus lateralis muscle, right adductor magnus muscle and left superior gluteus maximus muscle. Electronically Signed   By: Kathreen Devoid   On: 12/22/2016 17:39   Dg Knee Complete 4 Views Right  Result Date: 12/22/2016 CLINICAL DATA:  Patient complains of 2 weeks of swelling to right stump, denies any trauma and does not wear prosthetic. Also complains of left hip pain with with movement. States that he is suppose to be scheduled for appointment at wound clinic in future. No fever, NAD. Noticeable green circular scab on bottom of right stump. EXAM: RIGHT KNEE - COMPLETE 4+ VIEW COMPARISON:  None. FINDINGS: Status post below-the-knee amputation. The amputated margins of the remaining tibia and fibula are well-defined. There is no bone resorption to suggest osteomyelitis. No fracture. Bones are demineralized. There is mild medial and lateral femorotibial joint space compartment narrowing. No knee joint effusion. Dense vascular calcifications are noted along the distal superficial femoral artery and popliteal artery. Soft tissue edema is seen overlying the stump. No soft tissue air. A small metallic foreign body projects within the posterior subcutaneous soft tissues at the level of the knee. IMPRESSION: 1. No fracture.  No evidence of osteomyelitis. 2. Soft tissue swelling  overlying the amputated distal tibia and fibula. Electronically Signed   By: Lajean Manes M.D.   On: 12/22/2016 13:19   Dg Hip Unilat With Pelvis 2-3 Views Left  Result Date:  12/22/2016 CLINICAL DATA:  Patient complains of 2 weeks of swelling to right stump, denies any trauma and does not wear prosthetic. Also complains of left hip pain with with movement. States that he is suppose to be scheduled for appointment at wound clinic in future. No fever, NAD. Noticeable green circular scab on bottom of right stump. EXAM: DG HIP (WITH OR WITHOUT PELVIS) 2-3V LEFT COMPARISON:  None. FINDINGS: No fracture.  No bone lesion. The hip joint is normally aligned. There is mild concentric hip joint space narrowing. Small marginal osteophytes project from the inferior femoral head. Bones are demineralized. There is dense calcification along the iliac and femoral vessels. IMPRESSION: 1. No fracture or bone lesion. 2. Mild hip joint arthropathic change. Electronically Signed   By: Lajean Manes M.D.   On: 12/22/2016 13:21     Labs:   Basic Metabolic Panel:  Recent Labs Lab 12/22/16 1225 12/23/16 0331  NA 136 138  K 4.5 4.7  CL 97* 96*  CO2 26 26  GLUCOSE 138* 120*  BUN 53* 62*  CREATININE 8.78* 9.75*  CALCIUM 7.9* 7.7*   GFR Estimated Creatinine Clearance: 5 mL/min (A) (by C-G formula based on SCr of 9.75 mg/dL (H)). Liver Function Tests:  Recent Labs Lab 12/22/16 1225  AST 14*  ALT 11*  ALKPHOS 74  BILITOT 0.5  PROT 6.1*  ALBUMIN 3.0*   No results for input(s): LIPASE, AMYLASE in the last 168 hours. No results for input(s): AMMONIA in the last 168 hours. Coagulation profile  Recent Labs Lab 12/22/16 1225  INR 1.05    CBC:  Recent Labs Lab 12/22/16 1225 12/23/16 0331  WBC 4.6 5.8  NEUTROABS 3.2  --   HGB 11.4* 10.7*  HCT 38.7* 35.9*  MCV 97.2 96.8  PLT 187 166   Cardiac Enzymes: No results for input(s): CKTOTAL, CKMB, CKMBINDEX, TROPONINI in the last 168 hours. BNP: Invalid input(s): POCBNP CBG: No results for input(s): GLUCAP in the last 168 hours. D-Dimer No results for input(s): DDIMER in the last 72 hours. Hgb A1c No results for  input(s): HGBA1C in the last 72 hours. Lipid Profile No results for input(s): CHOL, HDL, LDLCALC, TRIG, CHOLHDL, LDLDIRECT in the last 72 hours. Thyroid function studies No results for input(s): TSH, T4TOTAL, T3FREE, THYROIDAB in the last 72 hours.  Invalid input(s): FREET3 Anemia work up No results for input(s): VITAMINB12, FOLATE, FERRITIN, TIBC, IRON, RETICCTPCT in the last 72 hours. Microbiology Recent Results (from the past 240 hour(s))  MRSA PCR Screening     Status: None   Collection Time: 12/22/16 10:47 PM  Result Value Ref Range Status   MRSA by PCR NEGATIVE NEGATIVE Final    Comment:        The GeneXpert MRSA Assay (FDA approved for NASAL specimens only), is one component of a comprehensive MRSA colonization surveillance program. It is not intended to diagnose MRSA infection nor to guide or monitor treatment for MRSA infections.      Discharge Instructions:   Discharge Instructions    Discharge instructions    Complete by:  As directed    Will be getting 2 abx with dialysis for 6 weeks Renal diet   Increase activity slowly    Complete by:  As directed      Allergies as  of 12/24/2016   No Known Allergies     Medication List    TAKE these medications   amLODipine 5 MG tablet Commonly known as:  NORVASC Take 5 mg by mouth daily.   atorvastatin 80 MG tablet Commonly known as:  LIPITOR Take 1 tablet (80 mg total) by mouth daily. What changed:  when to take this   brimonidine 0.2 % ophthalmic solution Commonly known as:  ALPHAGAN Place 1 drop into both eyes 2 (two) times daily.   cefTAZidime 2 g in dextrose 5 % 50 mL Inject 2 g into the vein every Monday, Wednesday, and Friday at 8 PM. Start taking on:  12/25/2016   diclofenac sodium 1 % Gel Commonly known as:  VOLTAREN Apply 1 application topically 3 (three) times daily as needed (pain).   docusate sodium 100 MG capsule Commonly known as:  COLACE Take 2 capsules (200 mg total) by mouth  daily. What changed:  how much to take  when to take this  reasons to take this   feeding supplement Liqd Take 1 Container by mouth 4 (four) times daily -  with meals and at bedtime. What changed:  when to take this   gabapentin 300 MG capsule Commonly known as:  NEURONTIN Take 300 mg by mouth at bedtime.   hydrALAZINE 50 MG tablet Commonly known as:  APRESOLINE Take 50 mg by mouth 2 (two) times daily as needed (high blood pressure >160/80).   HYDROcodone-acetaminophen 5-325 MG tablet Commonly known as:  NORCO/VICODIN Take 1 tablet by mouth every 4 (four) hours as needed. What changed:  reasons to take this   isosorbide mononitrate 30 MG 24 hr tablet Commonly known as:  IMDUR TAKE 1/2 TABLET BY MOUTH AT BEDTIME.   ketoconazole 2 % cream Commonly known as:  NIZORAL Apply 1 application topically daily as needed for irritation (itching).   lidocaine-prilocaine cream Commonly known as:  EMLA Apply 1 application topically every Monday, Wednesday, and Friday. For dialysis   lisinopril 40 MG tablet Commonly known as:  PRINIVIL,ZESTRIL Take 40 mg by mouth daily.   nitroGLYCERIN 0.4 MG SL tablet Commonly known as:  NITROSTAT Place 1 tablet (0.4 mg total) under the tongue every 5 (five) minutes as needed for chest pain.   pantoprazole 40 MG tablet Commonly known as:  PROTONIX Take 1 tablet (40 mg total) by mouth daily at 12 noon.   SENSIPAR 60 MG tablet Generic drug:  cinacalcet Take 60 mg by mouth daily after supper.   sevelamer carbonate 800 MG tablet Commonly known as:  RENVELA Take 1,600-2,400 mg by mouth See admin instructions. Take 3 tablets (2400 mg) with meals and 2 tablets (1600 mg) with snacks   timolol 0.5 % ophthalmic solution Commonly known as:  BETIMOL Place 1 drop into both eyes 2 (two) times daily.   Vancomycin 750-5 MG/150ML-% Soln Commonly known as:  VANCOCIN Inject 150 mLs (750 mg total) into the vein every Monday, Wednesday, and Friday with  hemodialysis. Start taking on:  12/25/2016      Follow-up Information    DONDIEGO,RICHARD M, MD Follow up in 1 week(s).   Specialty:  Internal Medicine Contact information: [place] [address] [phone]        [name], MD Follow up.   Specialties:  Vascular Surgery, Cardiology Why:  to discuss surgical options for infection in your stump Contact information: [address] [place]  [address] [phone]  Time coordinating discharge: 35 min  Signed:  Gisella Alwine U Shaneil Yazdi   [facility] 12/24/2016, 11:37 AM

## 2016-12-24 NOTE — Progress Notes (Signed)
   VASCULAR SURGERY ASSESSMENT & PLAN:   As per our note yesterday, patient was not interested in Right AKA. From our standpoint he could be discharged. If he later is agreeable to consider surgery I do not think it would be unreasonable to shorten his BKA. However, the better option would probably be above-the-knee amputation given that he will likely not be ambulatory as he has bilateral amputations.  I will be awaiting the rest of the week. If he wants to consider surgery this admission my office number is [phone]. If he is discharged, I will arrange a follow up visit with me as an outpatient to discuss these options again.   SUBJECTIVE:   Continues to have pain at distal aspect of right below-the-knee amputation.  PHYSICAL EXAM:   Vitals:   12/23/16 1809 12/23/16 2100 12/24/16 0500 12/24/16 0513  BP: (!) 160/78 (!) 107/48  (!) 116/51  Pulse: 84 80  62  Resp: 20 18  17   Temp: 98 F (36.7 C) 98.5 F (36.9 C)  98.4 F (36.9 C)  TempSrc: Oral Oral  Oral  SpO2: 98% 94%  97%  Weight: 139 lb 8.8 oz (63.3 kg)  145 lb 11.6 oz (66.1 kg)    Small eschar at distal aspect of right below-the-knee amputation. No drainage or erythema. This appears to be a chronic wound.  LABS:   Lab Results  Component Value Date   WBC 5.8 12/23/2016   HGB 10.7 (L) 12/23/2016   HCT 35.9 (L) 12/23/2016   MCV 96.8 12/23/2016   PLT 166 12/23/2016   Lab Results  Component Value Date   CREATININE 9.75 (H) 12/23/2016   Lab Results  Component Value Date   INR 1.05 12/22/2016    PROBLEM LIST:    Principal Problem:   Amputation stump infection ([place]) Active Problems:   ESRD (end stage renal disease) on dialysis ([facility])   Chronic combined systolic and diastolic congestive heart failure ([facility])   Essential hypertension   Osteomyelitis ([facility])   CURRENT MEDS:   . amLODipine  5 mg Oral Daily  . atorvastatin  80 mg Oral QPC supper  . brimonidine  1 drop Both Eyes BID  . calcitRIOL  3 mcg Oral Q  M,W,F-HD  . cinacalcet  60 mg Oral Q supper  . [START ON 12/25/2016] darbepoetin (ARANESP) injection - DIALYSIS  200 mcg Intravenous Q Wed-HD  . feeding supplement  1 Container Oral Daily  . gabapentin  300 mg Oral QHS  . heparin  5,000 Units Subcutaneous Q8H  . isosorbide mononitrate  15 mg Oral QHS  . lisinopril  40 mg Oral Daily  . pantoprazole  40 mg Oral Q1200  . piperacillin-tazobactam (ZOSYN)  IV  3.375 g Intravenous Q12H  . sevelamer carbonate  2,400 mg Oral TID WC  . timolol  1 drop Both Eyes BID  . vancomycin  750 mg Intravenous Q M,W,F-HD    [name] Beeper: [phone] Office: [phone] 12/24/2016

## 2016-12-25 ENCOUNTER — Encounter (HOSPITAL_BASED_OUTPATIENT_CLINIC_OR_DEPARTMENT_OTHER): Payer: Medicare Other | Attending: Surgery

## 2016-12-25 DIAGNOSIS — Z992 Dependence on renal dialysis: Secondary | ICD-10-CM | POA: Diagnosis not present

## 2016-12-25 DIAGNOSIS — E785 Hyperlipidemia, unspecified: Secondary | ICD-10-CM | POA: Insufficient documentation

## 2016-12-25 DIAGNOSIS — I120 Hypertensive chronic kidney disease with stage 5 chronic kidney disease or end stage renal disease: Secondary | ICD-10-CM | POA: Insufficient documentation

## 2016-12-25 DIAGNOSIS — Z79891 Long term (current) use of opiate analgesic: Secondary | ICD-10-CM | POA: Insufficient documentation

## 2016-12-25 DIAGNOSIS — I70232 Atherosclerosis of native arteries of right leg with ulceration of calf: Secondary | ICD-10-CM | POA: Insufficient documentation

## 2016-12-25 DIAGNOSIS — I428 Other cardiomyopathies: Secondary | ICD-10-CM | POA: Diagnosis not present

## 2016-12-25 DIAGNOSIS — N186 End stage renal disease: Secondary | ICD-10-CM | POA: Insufficient documentation

## 2016-12-25 DIAGNOSIS — J439 Emphysema, unspecified: Secondary | ICD-10-CM | POA: Insufficient documentation

## 2016-12-25 DIAGNOSIS — Z79899 Other long term (current) drug therapy: Secondary | ICD-10-CM | POA: Diagnosis not present

## 2016-12-25 DIAGNOSIS — Z8673 Personal history of transient ischemic attack (TIA), and cerebral infarction without residual deficits: Secondary | ICD-10-CM | POA: Diagnosis not present

## 2016-12-25 DIAGNOSIS — I2510 Atherosclerotic heart disease of native coronary artery without angina pectoris: Secondary | ICD-10-CM | POA: Insufficient documentation

## 2016-12-25 DIAGNOSIS — Z89511 Acquired absence of right leg below knee: Secondary | ICD-10-CM | POA: Diagnosis not present

## 2016-12-25 DIAGNOSIS — Z89612 Acquired absence of left leg above knee: Secondary | ICD-10-CM | POA: Diagnosis not present

## 2016-12-25 DIAGNOSIS — L97811 Non-pressure chronic ulcer of other part of right lower leg limited to breakdown of skin: Secondary | ICD-10-CM | POA: Diagnosis present

## 2016-12-25 DIAGNOSIS — I255 Ischemic cardiomyopathy: Secondary | ICD-10-CM | POA: Diagnosis not present

## 2016-12-25 DIAGNOSIS — Z87891 Personal history of nicotine dependence: Secondary | ICD-10-CM | POA: Diagnosis not present

## 2016-12-25 DIAGNOSIS — M86161 Other acute osteomyelitis, right tibia and fibula: Secondary | ICD-10-CM | POA: Diagnosis not present

## 2016-12-25 DIAGNOSIS — I350 Nonrheumatic aortic (valve) stenosis: Secondary | ICD-10-CM | POA: Diagnosis not present

## 2016-12-28 ENCOUNTER — Emergency Department (HOSPITAL_COMMUNITY)
Admission: EM | Admit: 2016-12-28 | Discharge: 2016-12-28 | Disposition: A | Discharge status: Home / Self Care | Payer: Medicare Other | Source: Home / Self Care | Attending: Emergency Medicine | Admitting: Emergency Medicine

## 2016-12-28 ENCOUNTER — Emergency Department (HOSPITAL_COMMUNITY): Payer: Medicare Other

## 2016-12-28 ENCOUNTER — Encounter (HOSPITAL_COMMUNITY): Payer: Self-pay | Admitting: Emergency Medicine

## 2016-12-28 DIAGNOSIS — I509 Heart failure, unspecified: Secondary | ICD-10-CM

## 2016-12-28 DIAGNOSIS — Z89511 Acquired absence of right leg below knee: Secondary | ICD-10-CM | POA: Insufficient documentation

## 2016-12-28 DIAGNOSIS — Z87891 Personal history of nicotine dependence: Secondary | ICD-10-CM | POA: Insufficient documentation

## 2016-12-28 DIAGNOSIS — Z79899 Other long term (current) drug therapy: Secondary | ICD-10-CM

## 2016-12-28 DIAGNOSIS — N186 End stage renal disease: Secondary | ICD-10-CM | POA: Insufficient documentation

## 2016-12-28 DIAGNOSIS — I2510 Atherosclerotic heart disease of native coronary artery without angina pectoris: Secondary | ICD-10-CM

## 2016-12-28 DIAGNOSIS — I132 Hypertensive heart and chronic kidney disease with heart failure and with stage 5 chronic kidney disease, or end stage renal disease: Secondary | ICD-10-CM

## 2016-12-28 DIAGNOSIS — M8610 Other acute osteomyelitis, unspecified site: Secondary | ICD-10-CM | POA: Insufficient documentation

## 2016-12-28 LAB — CBC WITH DIFFERENTIAL/PLATELET
Basophils Absolute: 0 10*3/uL (ref 0.0–0.1)
Basophils Relative: 0 %
Eosinophils Absolute: 0.1 10*3/uL (ref 0.0–0.7)
Eosinophils Relative: 2 %
HCT: 40.4 % (ref 39.0–52.0)
HEMOGLOBIN: 11.8 g/dL — AB (ref 13.0–17.0)
LYMPHS ABS: 0.8 10*3/uL (ref 0.7–4.0)
LYMPHS PCT: 12 %
MCH: 29 pg (ref 26.0–34.0)
MCHC: 29.2 g/dL — ABNORMAL LOW (ref 30.0–36.0)
MCV: 99.3 fL (ref 78.0–100.0)
Monocytes Absolute: 0.6 10*3/uL (ref 0.1–1.0)
Monocytes Relative: 10 %
NEUTROS PCT: 76 %
Neutro Abs: 4.7 10*3/uL (ref 1.7–7.7)
Platelets: 249 10*3/uL (ref 150–400)
RBC: 4.07 MIL/uL — AB (ref 4.22–5.81)
RDW: 19.6 % — ABNORMAL HIGH (ref 11.5–15.5)
WBC: 6.2 10*3/uL (ref 4.0–10.5)

## 2016-12-28 LAB — BASIC METABOLIC PANEL
ANION GAP: 13 (ref 5–15)
BUN: 41 mg/dL — AB (ref 6–20)
CHLORIDE: 96 mmol/L — AB (ref 101–111)
CO2: 29 mmol/L (ref 22–32)
Calcium: 9 mg/dL (ref 8.9–10.3)
Creatinine, Ser: 6.79 mg/dL — ABNORMAL HIGH (ref 0.61–1.24)
GFR calc Af Amer: 8 mL/min — ABNORMAL LOW (ref >60)
GFR calc non Af Amer: 7 mL/min — ABNORMAL LOW (ref >60)
GLUCOSE: 93 mg/dL (ref 65–99)
POTASSIUM: 4.7 mmol/L (ref 3.5–5.1)
SODIUM: 138 mmol/L (ref 135–145)

## 2016-12-28 MED ORDER — OXYCODONE-ACETAMINOPHEN 5-325 MG PO TABS: 1.0000 | ORAL_TABLET | Freq: Four times a day (QID) | ORAL | 0 refills | Status: DC | PRN

## 2016-12-28 MED ORDER — OXYCODONE-ACETAMINOPHEN 5-325 MG PO TABS
1.0000 | ORAL_TABLET | Freq: Once | ORAL | Status: AC
  Administered 2016-12-28: 1 via ORAL
  Filled 2016-12-28: qty 1

## 2016-12-28 MED ORDER — ONDANSETRON HCL 4 MG PO TABS
4.0000 mg | ORAL_TABLET | Freq: Once | ORAL | Status: AC
  Administered 2016-12-28: 4 mg via ORAL
  Filled 2016-12-28: qty 1

## 2016-12-28 NOTE — Discharge Instructions (Signed)
Your vital signs within normal limits. Your oxygen level is 95% on room air. The plain x-ray of your leg does not show evidence of severe changes in the osteomyelitis involving your right leg. Your lab work is similar to your previous evaluation, and does not require a change in therapy at this time. Please stop your current pain medication. Use Percocet every 6 hours for pain. Please make your physician aware of the change in your pain medication. This medication may cause drowsiness, it may also cause constipation. Please use a stool softener while taking this medication.

## 2016-12-28 NOTE — ED Notes (Signed)
Patient transported to X-ray 

## 2016-12-28 NOTE — ED Provider Notes (Signed)
[facility] DEPT Provider Note   CSN: [id number] Arrival date & time: 12/28/16  1651     History   Chief Complaint Chief Complaint  Patient presents with  . Leg Pain    stump pain    HPI Randy [name] is a 83 y.o. male.  Her patient is an 83 year old male who presents to the emergency department with a complaint of right stump pain. 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  The patient has been having problems with increasing pain of his right stump. He developed an ulcer. He was evaluated at the [facility] where M R I suggested cellulitis and osteomyelitis. The patient was treated and later released with instructions to have antibiotic coverage during his dialysis on Monday Wednesday and Friday. The patient was treated with Norco. He states that this is not helping his pain. He feels that the pain is getting progressively worse. The patient is supposed to have an evaluation with the vascular surgeon in approximately a week. He states however that the pain is getting so severe he does not feel he can make it a week before getting something done. He has not had fever or chills reported. There's been no recent injury or trauma to the right lower extremity.      Past Medical History:  Diagnosis Date  . Anemia of chronic disease   . Arthritis   . CAD (coronary artery disease)    Moderate multivessel disease 07/2015 - managed medically  . ESRD on hemodialysis [facility])    M/W/F in [place] - Dr. Florene Glen  . Essential hypertension   . Gout   . History of blood transfusion   . History of myopericarditis 2015   07/2015  . History of pneumonia 2014  . History of stroke   . Hypercholesterolemia   . Nonischemic cardiomyopathy (HCC)    LVEF 35-40%  .  Peripheral vascular disease ([place])    Status post right below knee amputation for a nonhealing wound of the right foot 12/2014  . Pneumonia     Patient Active Problem List   Diagnosis Date Noted  . Amputation stump infection ([place]) 12/23/2016  . Osteomyelitis ([place]) 12/23/2016  . Atrial fibrillation ([place]) 08/19/2016  . Hypertriglyceridemia   . Hypokalemia   . Hypomagnesemia   . Gastrointestinal hemorrhage associated with anorectal source   . CAD in native artery   . Aortic stenosis   . Coronary artery disease involving native coronary artery of native heart with angina pectoris ([place])   . Hyperlipidemia   . Cardiomyopathy, ischemic   . GI bleed 03/03/2016  . Hyperglycemia 03/03/2016  . GI bleeding 03/03/2016  . Elevated troponin   . Hypertensive urgency 11/09/2015  . Hypertensive urgency, malignant 11/08/2015  . Malnutrition of moderate degree 11/08/2015  . Adjustment disorder with depressed mood   . Constipation due to pain medication   . Slow transit constipation   . Unilateral complete BKA ([place]) 10/23/2015  . Labile blood pressure   . Muscle stiffness   . S/P AKA (above knee amputation) unilateral ([place])   . History of right above knee amputation ([place])   . Benign essential HTN   . Unilateral AKA ([place]) 10/12/2015  . Abnormality of gait   . Status post above knee amputation of left lower extremity ([place])   . Chronic combined systolic and diastolic  congestive heart failure ([place])   . ESRD on dialysis ([place])   . Essential hypertension   . Post-operative pain   . Peripheral vascular disease with pain at rest [facility]) 10/10/2015  . Chest pain 08/04/2015  . PD catheter dysfunction ([place]) 03/13/2015  . Cellulitis and abscess of foot   . Kidney mass   . Other emphysema ([place])   . Symptomatic anemia   . Physical deconditioning   . S/P BKA (below knee amputation) unilateral ([place])   . Renal mass 01/01/2015  . Foot pain 12/30/2014  . Foot pain, right 12/30/2014  . CHF (congestive heart  failure) ([place]) 12/30/2014  . CAD (coronary artery disease) 12/30/2014  . Hyperkalemia 12/30/2014  . ESRD (end stage renal disease) on dialysis ([place])   . Blood loss anemia   . Acute upper GI bleed 09/21/2014  . Fluid overload 08/29/2014  . Acute on chronic combined systolic and diastolic CHF (congestive heart failure) ([place]) 08/29/2014  . Acute respiratory failure with hypoxia ([place]) 08/29/2014  . Acute respiratory failure with hypoxemia ([place]) 08/29/2014  . Acute myopericarditis 08/18/2014  . Anemia of chronic disease   . End stage renal disease, has been peritoneal, now hemodialysis 07/28/2012  . Secondary cardiomyopathy ([place]) 06/26/2012  . HLD (hyperlipidemia) 12/10/2006  . Hypertensive heart disease 08/18/2006  . OSTEOARTHRITIS 08/18/2006  . Polycystic kidney 08/18/2006    Past Surgical History:  Procedure Laterality Date  . ABOVE KNEE LEG AMPUTATION Left 10/10/2015  . AMPUTATION Right 01/05/2015   BKA  . AMPUTATION Left 10/10/2015   Procedure: AMPUTATION ABOVE KNEE LEFT;  Surgeon: Angelia Mould, MD;  Location: [facility];  Service: Vascular;  Laterality: Left;  . AV FISTULA PLACEMENT  08/24/2012   Procedure: ARTERIOVENOUS (AV) FISTULA CREATION;  Surgeon: Rosetta Posner, MD;  Location: [facility];  Service: Vascular;  Laterality: Left;  . BACK SURGERY    . CAPD REMOVAL N/A 03/13/2015   Procedure: CONTINUOUS AMBULATORY PERITONEAL DIALYSIS  (CAPD) CATHETER REMOVAL;  Surgeon: Coralie Keens, MD;  Location: [facility];  Service: General;  Laterality: N/A;  . CARDIAC CATHETERIZATION    . CHOLECYSTECTOMY    . COLONOSCOPY     remote past by Dr. Tamala Julian in [place], [place]   . COLONOSCOPY Left 09/26/2014   Dr. Paulita Fujita: old and semi-fresh blood scattered throughout the colon, several medium-sized diverticula, no obvious focal bleeding site, no blood in TI, several polyps seen in cecum, ascending colon and proximal transverse s/p biopsy (tubular adenomas)  . COLONOSCOPY N/A 03/05/2016   Dr. Oneida Alar: old  blood in ileum, few fresh clots ascending colon, frequent large mouth diverticula and old clots in descending/sigmoid. 8 polyps in ascending colon not manipulated  . ESOPHAGOGASTRODUODENOSCOPY N/A 03/05/2016   Dr. Oneida Alar: hiatal hernia, mild gastritis   . GANGLION CYST EXCISION  01/03/2012   Procedure: REMOVAL GANGLION OF WRIST;  Surgeon: Scherry Ran, MD;  Location: [facility];  Service: General;  Laterality: Right;  . GIVENS CAPSULE STUDY N/A 03/07/2016   [place]: dark material in ileum, with fresh blood in right colon   . HEMORRHOID SURGERY  1970's  . INSERTION OF DIALYSIS CATHETER Right     neck  . INSERTION OF DIALYSIS CATHETER  10/19/2012   Procedure: INSERTION OF DIALYSIS CATHETER;  Surgeon: Rosetta Posner, MD;  Location: [facility];  Service: Vascular;  Laterality: N/A;  [place] ARTHROSCOPY Right 2007  . LEFT HEART CATHETERIZATION WITH CORONARY ANGIOGRAM N/A 08/17/2014   Procedure: LEFT HEART CATHETERIZATION WITH  CORONARY ANGIOGRAM;  Surgeon: Larey Dresser, MD;  Location: [facility] CATH LAB;  Service: Cardiovascular;  Laterality: N/A;  . [place] SURGERY  2004       Home Medications    Prior to Admission medications   Medication Sig Start Date End Date Taking? Authorizing Provider  amLODipine (NORVASC) 5 MG tablet Take 5 mg by mouth daily.    Historical Provider, MD  atorvastatin (LIPITOR) 80 MG tablet Take 1 tablet (80 mg total) by mouth daily. Patient taking differently: Take 80 mg by mouth daily after supper.  11/02/15   Arnoldo Lenis, MD  brimonidine (ALPHAGAN) 0.2 % ophthalmic solution Place 1 drop into both eyes 2 (two) times daily.    Historical Provider, MD  cefTAZidime 2 g in dextrose 5 % 50 mL Inject 2 g into the vein every Monday, Wednesday, and Friday at 8 PM. 12/25/16   Geradine Girt, DO  cinacalcet (SENSIPAR) 60 MG tablet Take 60 mg by mouth daily after supper.     Historical Provider, MD  diclofenac sodium (VOLTAREN) 1 % GEL Apply 1 application  topically 3 (three) times daily as needed (pain).  12/12/16   Historical Provider, MD  docusate sodium (COLACE) 100 MG capsule Take 2 capsules (200 mg total) by mouth daily. Patient taking differently: Take 200-300 mg by mouth daily as needed for mild constipation or moderate constipation.  10/31/15   Bary Leriche, PA-C  feeding supplement (BOOST / RESOURCE BREEZE) LIQD Take 1 Container by mouth 4 (four) times daily -  with meals and at bedtime. Patient taking differently: Take 1 Container by mouth daily.  03/14/16   Lavina Hamman, MD  gabapentin (NEURONTIN) 300 MG capsule Take 300 mg by mouth at bedtime.    Historical Provider, MD  hydrALAZINE (APRESOLINE) 50 MG tablet Take 50 mg by mouth 2 (two) times daily as needed (high blood pressure >160/80).     Historical Provider, MD  HYDROcodone-acetaminophen (NORCO/VICODIN) 5-325 MG tablet Take 1 tablet by mouth every 4 (four) hours as needed (pain). 12/24/16   Geradine Girt, DO  isosorbide mononitrate (IMDUR) 30 MG 24 hr tablet TAKE 1/2 TABLET BY MOUTH AT BEDTIME. 07/10/16   Arnoldo Lenis, MD  ketoconazole (NIZORAL) 2 % cream Apply 1 application topically daily as needed for irritation (itching).  11/07/16   Historical Provider, MD  lidocaine-prilocaine (EMLA) cream Apply 1 application topically every Monday, Wednesday, and Friday. For dialysis 10/06/15   Historical Provider, MD  lisinopril (PRINIVIL,ZESTRIL) 40 MG tablet Take 40 mg by mouth daily. 12/03/16   Historical Provider, MD  nitroGLYCERIN (NITROSTAT) 0.4 MG SL tablet Place 1 tablet (0.4 mg total) under the tongue every 5 (five) minutes as needed for chest pain. 12/20/15   Ripudeep Krystal Eaton, MD  pantoprazole (PROTONIX) 40 MG tablet Take 1 tablet (40 mg total) by mouth daily at 12 noon. 10/23/15   Barton Dubois, MD  sevelamer carbonate (RENVELA) 800 MG tablet Take 1,600-2,400 mg by mouth See admin instructions. Take 3 tablets (2400 mg) with meals and 2 tablets (1600 mg) with snacks    Historical Provider,  MD  timolol (BETIMOL) 0.5 % ophthalmic solution Place 1 drop into both eyes 2 (two) times daily.    Historical Provider, MD  Vancomycin (VANCOCIN) 750-5 MG/150ML-% SOLN Inject 150 mLs (750 mg total) into the vein every Monday, Wednesday, and Friday with hemodialysis. 12/25/16   Geradine Girt, DO    Family History Family History  Problem Relation Age of  Onset  . Stroke Mother   . Arthritis    . Cancer    . Kidney disease    . Cancer Sister   . Colon cancer Brother     unclear   . Colon cancer Brother   . Anesthesia problems Neg Hx   . Hypotension Neg Hx   . Malignant hyperthermia Neg Hx   . Pseudochol deficiency Neg Hx     Social History Social History  Substance Use Topics  . Smoking status: Former Smoker    Packs/day: 1.00    Years: 30.00    Types: Cigarettes    Quit date: 08/19/1998  . Smokeless tobacco: Former Systems developer    Types: Chew    Quit date: 12/31/1993  . Alcohol use No     Allergies   Patient has no known allergies.   Review of Systems Review of Systems  Musculoskeletal: Positive for arthralgias.  Skin: Positive for wound.  Neurological: Positive for weakness.  All other systems reviewed and are negative.    Physical Exam Updated Vital Signs BP 109/63 (BP Location: Right Arm)   Pulse 68   Temp 97.8 F (36.6 C) (Oral)   Resp 20   Ht 5\' 6"  (1.676 m)   Wt 65.8 kg   SpO2 95%   BMI 23.40 kg/m   Physical Exam  Constitutional: He is oriented to person, place, and time. He appears well-developed and well-nourished.  Non-toxic appearance.  HENT:  Head: Normocephalic.  Right Ear: Tympanic membrane and external ear normal.  Left Ear: Tympanic membrane and external ear normal.  Eyes: EOM and lids are normal. Pupils are equal, round, and reactive to light.  Neck: Normal range of motion. Neck supple. Carotid bruit is not present.  Cardiovascular: Normal rate, regular rhythm, intact distal pulses and normal pulses.   Murmur heard. Pulmonary/Chest: Breath  sounds normal. No respiratory distress.  Abdominal: Soft. Bowel sounds are normal. There is no tenderness. There is no guarding.  Musculoskeletal: Normal range of motion.       Arms: Patient is an above-the-knee amputee on the left. There no lesions appreciated on the left. The patient is a below the knee amputee on the right. There is a scabbed ulcer at the distal portion of the right stump. There is tenderness from the distal portion of the stump to just above the right knee. The area is cool to touch compared to the other portions of the right lower extremity. No red streaks appreciated. No active drainage at this time.  Lymphadenopathy:       Head (right side): No submandibular adenopathy present.       Head (left side): No submandibular adenopathy present.    He has no cervical adenopathy.  Neurological: He is alert and oriented to person, place, and time. He has normal strength. No cranial nerve deficit or sensory deficit.  Skin: Skin is warm and dry.  Psychiatric: He has a normal mood and affect. His speech is normal.  Nursing note and vitals reviewed.    ED Treatments / Results  Labs (all labs ordered are listed, but only abnormal results are displayed) Labs Reviewed  CBC WITH DIFFERENTIAL/PLATELET - Abnormal; Notable for the following:       Result Value   RBC 4.07 (*)    Hemoglobin 11.8 (*)    MCHC 29.2 (*)    RDW 19.6 (*)    All other components within normal limits  BASIC METABOLIC PANEL    EKG  EKG Interpretation None  Radiology Dg Knee Complete 4 Views Right  Result Date: 12/28/2016 CLINICAL DATA:  Right knee pain and history of cellulitis and early osteomyelitis. Wound over end of right below-knee amputation. EXAM: RIGHT KNEE - COMPLETE 4+ VIEW COMPARISON:  12/22/2016 FINDINGS: Evidence of patient's below-knee amputation unchanged. Diffuse osteopenia. Tricompartmental osteoarthritic change. No definite evidence of bone destruction over the tibial stump to  suggest osteomyelitis. Moderate calcified atherosclerotic plaque over the femoral popliteal arteries. IMPRESSION: No definite evidence of osteomyelitis. Below-knee amputation unchanged. Mild tricompartmental osteoarthritis. Atherosclerotic disease. Electronically Signed   By: Marin Olp M.D.   On: 12/28/2016 18:48    Procedures Procedures (including critical care time)  Medications Ordered in ED Medications  oxyCODONE-acetaminophen (PERCOCET/ROXICET) 5-325 MG per tablet 1 tablet (1 tablet Oral Given 12/28/16 1825)  ondansetron (ZOFRAN) tablet 4 mg (4 mg Oral Given 12/28/16 1825)     Initial Impression / Assessment and Plan / ED Course  I have reviewed the triage vital signs and the nursing notes.  Pertinent labs & imaging results that were available during my care of the patient were reviewed by me and considered in my medical decision making (see chart for details).      Final Clinical Impressions(s) / ED Diagnoses MDM I reviewed the most recent emergency department chart. I reviewed the imaging.  Vital signs within normal limits. The patient was treated in the emergency department with Percocet.  Recheck. The patient states that he is feeling a lot better after the Percocet.  X-ray of the right knee and stump show no evidence of osteomyelitis on plain x-ray film. There is no evidence of gas. There is noted on tricompartmental osteoarthritis. There is also noted atherosclerotic disease present. The complete blood count is nonacute.  Patient continues to feel some better. The patient will be discharged home with prescription for Percocet. I have given him instructions that this medication can cause drowsiness. We also discussed the need to use a stool softener while taking this particular medication. The patient is to discuss the case with his vascular specialist to see if he can be seen sooner since he is having increasing problems with pain. The patient and the spouse are in agreement  with this plan.    Final diagnoses:  Amputee, below knee, right ([place])  Other acute osteomyelitis, unspecified site ([place])  ESRD (end stage renal disease) [facility])    New Prescriptions New Prescriptions   No medications on file     Lily Kocher, Hershal Coria 12/28/16 2123    [name], MD 12/28/16 2318

## 2016-12-28 NOTE — ED Triage Notes (Signed)
Pt here for right stump pain.  Denies injury.  States he was told at [facility] he had a "cyst" on the bone.

## 2016-12-28 NOTE — ED Notes (Signed)
Pt returned from xray

## 2016-12-30 ENCOUNTER — Inpatient Hospital Stay (HOSPITAL_COMMUNITY)
Admission: EM | Admit: 2016-12-30 | Discharge: 2017-01-04 | DRG: 474 | Disposition: A | Discharge status: Home Health | Payer: Medicare Other | Attending: Internal Medicine | Admitting: Internal Medicine

## 2016-12-30 ENCOUNTER — Encounter (HOSPITAL_COMMUNITY): Payer: Self-pay

## 2016-12-30 ENCOUNTER — Encounter: Payer: Self-pay | Admitting: Vascular Surgery

## 2016-12-30 ENCOUNTER — Other Ambulatory Visit: Payer: Self-pay

## 2016-12-30 DIAGNOSIS — I428 Other cardiomyopathies: Secondary | ICD-10-CM | POA: Diagnosis present

## 2016-12-30 DIAGNOSIS — I480 Paroxysmal atrial fibrillation: Secondary | ICD-10-CM | POA: Diagnosis present

## 2016-12-30 DIAGNOSIS — N186 End stage renal disease: Secondary | ICD-10-CM | POA: Diagnosis not present

## 2016-12-30 DIAGNOSIS — I739 Peripheral vascular disease, unspecified: Secondary | ICD-10-CM | POA: Diagnosis present

## 2016-12-30 DIAGNOSIS — Z8701 Personal history of pneumonia (recurrent): Secondary | ICD-10-CM

## 2016-12-30 DIAGNOSIS — T8743 Infection of amputation stump, right lower extremity: Principal | ICD-10-CM | POA: Diagnosis present

## 2016-12-30 DIAGNOSIS — D638 Anemia in other chronic diseases classified elsewhere: Secondary | ICD-10-CM | POA: Diagnosis not present

## 2016-12-30 DIAGNOSIS — N2581 Secondary hyperparathyroidism of renal origin: Secondary | ICD-10-CM | POA: Diagnosis present

## 2016-12-30 DIAGNOSIS — K449 Diaphragmatic hernia without obstruction or gangrene: Secondary | ICD-10-CM | POA: Diagnosis present

## 2016-12-30 DIAGNOSIS — Y838 Other surgical procedures as the cause of abnormal reaction of the patient, or of later complication, without mention of misadventure at the time of the procedure: Secondary | ICD-10-CM | POA: Diagnosis present

## 2016-12-30 DIAGNOSIS — E8889 Other specified metabolic disorders: Secondary | ICD-10-CM | POA: Diagnosis present

## 2016-12-30 DIAGNOSIS — I132 Hypertensive heart and chronic kidney disease with heart failure and with stage 5 chronic kidney disease, or end stage renal disease: Secondary | ICD-10-CM | POA: Diagnosis present

## 2016-12-30 DIAGNOSIS — M869 Osteomyelitis, unspecified: Secondary | ICD-10-CM

## 2016-12-30 DIAGNOSIS — I5042 Chronic combined systolic (congestive) and diastolic (congestive) heart failure: Secondary | ICD-10-CM | POA: Diagnosis not present

## 2016-12-30 DIAGNOSIS — Z89612 Acquired absence of left leg above knee: Secondary | ICD-10-CM

## 2016-12-30 DIAGNOSIS — T879 Unspecified complications of amputation stump: Secondary | ICD-10-CM

## 2016-12-30 DIAGNOSIS — I10 Essential (primary) hypertension: Secondary | ICD-10-CM | POA: Diagnosis present

## 2016-12-30 DIAGNOSIS — I2510 Atherosclerotic heart disease of native coronary artery without angina pectoris: Secondary | ICD-10-CM | POA: Diagnosis present

## 2016-12-30 DIAGNOSIS — T8789 Other complications of amputation stump: Secondary | ICD-10-CM | POA: Diagnosis present

## 2016-12-30 DIAGNOSIS — Z79899 Other long term (current) drug therapy: Secondary | ICD-10-CM

## 2016-12-30 DIAGNOSIS — M86661 Other chronic osteomyelitis, right tibia and fibula: Secondary | ICD-10-CM | POA: Diagnosis not present

## 2016-12-30 DIAGNOSIS — I429 Cardiomyopathy, unspecified: Secondary | ICD-10-CM | POA: Diagnosis present

## 2016-12-30 DIAGNOSIS — E785 Hyperlipidemia, unspecified: Secondary | ICD-10-CM | POA: Diagnosis present

## 2016-12-30 DIAGNOSIS — Z8673 Personal history of transient ischemic attack (TIA), and cerebral infarction without residual deficits: Secondary | ICD-10-CM

## 2016-12-30 DIAGNOSIS — Z87891 Personal history of nicotine dependence: Secondary | ICD-10-CM

## 2016-12-30 DIAGNOSIS — Z993 Dependence on wheelchair: Secondary | ICD-10-CM

## 2016-12-30 DIAGNOSIS — Z992 Dependence on renal dialysis: Secondary | ICD-10-CM

## 2016-12-30 DIAGNOSIS — M1990 Unspecified osteoarthritis, unspecified site: Secondary | ICD-10-CM | POA: Diagnosis present

## 2016-12-30 LAB — CBC
HCT: 40.9 % (ref 39.0–52.0)
HEMOGLOBIN: 12.1 g/dL — AB (ref 13.0–17.0)
MCH: 28.9 pg (ref 26.0–34.0)
MCHC: 29.6 g/dL — ABNORMAL LOW (ref 30.0–36.0)
MCV: 97.8 fL (ref 78.0–100.0)
Platelets: 246 10*3/uL (ref 150–400)
RBC: 4.18 MIL/uL — ABNORMAL LOW (ref 4.22–5.81)
RDW: 19.9 % — AB (ref 11.5–15.5)
WBC: 8 10*3/uL (ref 4.0–10.5)

## 2016-12-30 MED ORDER — FENTANYL CITRATE (PF) 100 MCG/2ML IJ SOLN
50.0000 ug | Freq: Once | INTRAMUSCULAR | Status: AC
  Administered 2016-12-30: 50 ug via INTRAMUSCULAR
  Filled 2016-12-30: qty 2

## 2016-12-30 MED ORDER — MORPHINE SULFATE (PF) 4 MG/ML IV SOLN
2.0000 mg | Freq: Once | INTRAVENOUS | Status: AC
Start: 2016-12-30 — End: 2016-12-30
  Administered 2016-12-30: 2 mg via INTRAMUSCULAR
  Filled 2016-12-30: qty 1

## 2016-12-30 MED ORDER — OXYCODONE-ACETAMINOPHEN 5-325 MG PO TABS
1.0000 | ORAL_TABLET | Freq: Once | ORAL | Status: AC
  Administered 2016-12-30: 1 via ORAL
  Filled 2016-12-30: qty 1

## 2016-12-30 MED ORDER — HYDROMORPHONE HCL 1 MG/ML IJ SOLN
1.0000 mg | INTRAMUSCULAR | Status: DC | PRN
  Administered 2016-12-30 – 2017-01-03 (×6): 1 mg via INTRAVENOUS
  Filled 2016-12-30: qty 2
  Filled 2016-12-30 (×4): qty 1

## 2016-12-30 NOTE — ED Notes (Signed)
Pt reports he is supposed to have the sump amputated to AKA on 4/12

## 2016-12-30 NOTE — H&P (Signed)
History and Physical  Patient Name: Randy [name]     XBM:[id number]    DOB: 12/21/1931    DOA: 12/30/2016 PCP: [name], MD   Patient coming from: Home  Chief Complaint: Leg pain  HPI: Randy [name] is a 83 y.o. male with a past medical history significant for ESRD on HD MWF, CAD, HTN, NICM, PVD s/p L AKA and right BKA, and Afib not on AC due to hx GIB who presents with leg pain.  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    Within the last few weeks, he started to develop a new pain in his right BKA stump that is constant aching with lancinating excruciating pain and also new swelling.  He was admitted here for 2 days about a week ago, MRI showed probably early osteomyelitis and cellulitis. Case discussed with ID by phone who rec'd IV vancomycin and Fortaz, which he has been getting with HD. He was seen during that hospitalization by Dr. Donnetta Hutching who noted patient had been seen for similar symptoms back in Nov 2017 and rec'd possible AKA on right at that time but had deferred and things got better, and so surgery was again suggested, and it sounds like this was on the books for next week and patient's daughter got it moved forward to this Thursday she thinks, but today the pain was excruciating, he couldn't finish dialysis this morning or even move it was so bad, and so she brought him here.    The pain is in the tip of the stump near his chronic eschar, and lancinates up the both sides to the groin, severe and sharp in character.  Movement makes it worse, but it sometimes happens when he is still as well.  They think he has been taking only "cortisone" since discharge, no opioids and no OTC pain medicines, although both Percocet and Vicodin are on his medication list.  He has had no fever, chills, drainage.  The swelling is stable.  ED course: -Afebrile, heart rate 88,  respirations and pulse is normal, blood pressure 155/79 -WBC 8K, Hgb 12 -He was given fentanyl and morphine and Percocet without any improvement in his pain whatsoever -The case was discussed with VVS who agreed to see th patient tomorrow and TRH were asked to evaluate for pain control      ROS: Review of Systems  Constitutional: Negative for chills, fever and malaise/fatigue.  Musculoskeletal: Positive for joint pain (right stump).  All other systems reviewed and are negative.         Past Medical History:  Diagnosis Date  . Anemia of chronic disease   . Arthritis   . CAD (coronary artery disease)    Moderate multivessel disease 07/2015 - managed medically  . ESRD on hemodialysis [facility])    M/W/F in [place] - Dr. Florene Glen  . Essential hypertension   . Gout   . History of blood transfusion   . History of myopericarditis 2015   07/2015  . History of pneumonia 2014  . History of stroke   . Hypercholesterolemia   . Nonischemic cardiomyopathy (HCC)    LVEF 35-40%  . Peripheral vascular disease ([place])    Status post right below knee amputation for a nonhealing wound of the right foot 12/2014  . Pneumonia     Past Surgical History:  Procedure Laterality Date  .  ABOVE KNEE LEG AMPUTATION Left 10/10/2015  . AMPUTATION Right 01/05/2015   BKA  . AMPUTATION Left 10/10/2015   Procedure: AMPUTATION ABOVE KNEE LEFT;  Surgeon: Angelia Mould, MD;  Location: [facility];  Service: Vascular;  Laterality: Left;  . AV FISTULA PLACEMENT  08/24/2012   Procedure: ARTERIOVENOUS (AV) FISTULA CREATION;  Surgeon: Rosetta Posner, MD;  Location: [facility];  Service: Vascular;  Laterality: Left;  . BACK SURGERY    . CAPD REMOVAL N/A 03/13/2015   Procedure: CONTINUOUS AMBULATORY PERITONEAL DIALYSIS  (CAPD) CATHETER REMOVAL;  Surgeon: Coralie Keens, MD;  Location: [facility];  Service: General;  Laterality: N/A;  . CARDIAC CATHETERIZATION    . CHOLECYSTECTOMY    . COLONOSCOPY     remote past by Dr. Tamala Julian  in [place], [place]   . COLONOSCOPY Left 09/26/2014   Dr. Paulita Fujita: old and semi-fresh blood scattered throughout the colon, several medium-sized diverticula, no obvious focal bleeding site, no blood in TI, several polyps seen in cecum, ascending colon and proximal transverse s/p biopsy (tubular adenomas)  . COLONOSCOPY N/A 03/05/2016   Dr. Oneida Alar: old blood in ileum, few fresh clots ascending colon, frequent large mouth diverticula and old clots in descending/sigmoid. 8 polyps in ascending colon not manipulated  . ESOPHAGOGASTRODUODENOSCOPY N/A 03/05/2016   Dr. Oneida Alar: hiatal hernia, mild gastritis   . GANGLION CYST EXCISION  01/03/2012   Procedure: REMOVAL GANGLION OF WRIST;  Surgeon: Scherry Ran, MD;  Location: [facility];  Service: General;  Laterality: Right;  . GIVENS CAPSULE STUDY N/A 03/07/2016   [place]: dark material in ileum, with fresh blood in right colon   . HEMORRHOID SURGERY  1970's  . INSERTION OF DIALYSIS CATHETER Right     neck  . INSERTION OF DIALYSIS CATHETER  10/19/2012   Procedure: INSERTION OF DIALYSIS CATHETER;  Surgeon: Rosetta Posner, MD;  Location: [facility];  Service: Vascular;  Laterality: N/A;  [place] ARTHROSCOPY Right 2007  . LEFT HEART CATHETERIZATION WITH CORONARY ANGIOGRAM N/A 08/17/2014   Procedure: LEFT HEART CATHETERIZATION WITH CORONARY ANGIOGRAM;  Surgeon: Larey Dresser, MD;  Location: [facility] CATH LAB;  Service: Cardiovascular;  Laterality: N/A;  . [place] SURGERY  2004    Social History: Patient lives with his wife.  The patient is WC bound, uses a board for transfer. He is a remote former smoker.  He worked for [employer].  He is from [place], lives there now.    No Known Allergies  Family history: family history includes Cancer in his sister; Colon cancer in his brother and brother; Stroke in his mother.  Prior to Admission medications   Medication Sig Start Date End Date Taking? Authorizing Provider  amLODipine (NORVASC) 5 MG  tablet Take 5 mg by mouth daily.   Yes Historical Provider, MD  atorvastatin (LIPITOR) 80 MG tablet Take 1 tablet (80 mg total) by mouth daily. Patient taking differently: Take 80 mg by mouth daily after supper.  11/02/15  Yes Arnoldo Lenis, MD  brimonidine (ALPHAGAN) 0.2 % ophthalmic solution Place 1 drop into both eyes 2 (two) times daily.   Yes Historical Provider, MD  cefTAZidime 2 g in dextrose 5 % 50 mL Inject 2 g into the vein every Monday, Wednesday, and Friday at 8 PM. 12/25/16  Yes Geradine Girt, DO  cinacalcet (SENSIPAR) 60 MG tablet Take 60 mg by mouth daily after supper.    Yes Historical Provider, MD  diclofenac sodium (VOLTAREN) 1 % GEL  Apply 1 application topically 3 (three) times daily as needed (pain).  12/12/16  Yes Historical Provider, MD  docusate sodium (COLACE) 100 MG capsule Take 2 capsules (200 mg total) by mouth daily. Patient taking differently: Take 200-300 mg by mouth daily as needed for mild constipation or moderate constipation.  10/31/15  Yes Ivan Anchors Love, PA-C  feeding supplement (BOOST / RESOURCE BREEZE) LIQD Take 1 Container by mouth 4 (four) times daily -  with meals and at bedtime. Patient taking differently: Take 1 Container by mouth daily.  03/14/16  Yes Lavina Hamman, MD  gabapentin (NEURONTIN) 300 MG capsule Take 300 mg by mouth at bedtime.   Yes Historical Provider, MD  hydrALAZINE (APRESOLINE) 50 MG tablet Take 50 mg by mouth 2 (two) times daily as needed (high blood pressure >160/80).    Yes Historical Provider, MD  HYDROcodone-acetaminophen (NORCO/VICODIN) 5-325 MG tablet Take 1 tablet by mouth every 4 (four) hours as needed (pain). 12/24/16  Yes Geradine Girt, DO  isosorbide mononitrate (IMDUR) 30 MG 24 hr tablet TAKE 1/2 TABLET BY MOUTH AT BEDTIME. 07/10/16  Yes Arnoldo Lenis, MD  ketoconazole (NIZORAL) 2 % cream Apply 1 application topically daily as needed for irritation (itching).  11/07/16  Yes Historical Provider, MD  lidocaine-prilocaine (EMLA)  cream Apply 1 application topically every Monday, Wednesday, and Friday. For dialysis 10/06/15  Yes Historical Provider, MD  lisinopril (PRINIVIL,ZESTRIL) 40 MG tablet Take 40 mg by mouth daily. 12/03/16  Yes Historical Provider, MD  nitroGLYCERIN (NITROSTAT) 0.4 MG SL tablet Place 1 tablet (0.4 mg total) under the tongue every 5 (five) minutes as needed for chest pain. 12/20/15  Yes Ripudeep Krystal Eaton, MD  oxyCODONE-acetaminophen (PERCOCET/ROXICET) 5-325 MG tablet Take 1 tablet by mouth every 6 (six) hours as needed. 12/28/16  Yes Lily Kocher, PA-C  pantoprazole (PROTONIX) 40 MG tablet Take 1 tablet (40 mg total) by mouth daily at 12 noon. 10/23/15  Yes Barton Dubois, MD  sevelamer carbonate (RENVELA) 800 MG tablet Take 1,600-2,400 mg by mouth See admin instructions. Take 3 tablets (2400 mg) with meals and 2 tablets (1600 mg) with snacks   Yes Historical Provider, MD  timolol (BETIMOL) 0.5 % ophthalmic solution Place 1 drop into both eyes 2 (two) times daily.   Yes Historical Provider, MD  Vancomycin (VANCOCIN) 750-5 MG/150ML-% SOLN Inject 150 mLs (750 mg total) into the vein every Monday, Wednesday, and Friday with hemodialysis. 12/25/16  Yes Geradine Girt, DO       Physical Exam: BP 107/69   Pulse 74   Temp 98.8 F (37.1 C) (Oral)   Resp 15   Wt 65.8 kg (145 lb)   SpO2 99%   BMI 23.40 kg/m  General appearance: Elderly frail adult male, alert and in moderate distress from shooting intermittent pains.   Eyes: Anicteric, conjunctiva pink, lids and lashes normal. PERRL.    ENT: No nasal deformity, discharge, epistaxis.  Hearing Normal. OP moist without lesions.   Neck: No neck masses.  Trachea midline.  No thyromegaly/tenderness. Lymph: No cervical or supraclavicular lymphadenopathy. Skin: Warm and dry.  No jaundice.  No suspicious rashes or lesions. Cardiac: RRR, nl D3-U2, systolic murmur appreciated.  Capillary refill is brisk.  JVP normal.  Mild right stump edema, not really pitting.  Radial  pulses 1+ and symmetric. Respiratory: Normal respiratory rate and rhythm.  CTAB without rales or wheezes. Abdomen: Abdomen soft.  No TTP. No ascites, distension, hepatosplenomegaly.   MSK: No deformities or effusions in  upper extremities.  No cyanosis or clubbing.  Left AKA.  Right BKA, small eschar at tip.  No drainage, opening.  Exquisite pain to palpation. Neuro: Cranial nerves normal.  Sensation intact to light touch. Speech is fluent.  Muscle strength normal.    Psych: Sensorium intact and responding to questions, attention normal.  Behavior appropriate.  Affect normal.  Judgment and insight appear normal.     Labs on Admission:  I have personally reviewed following labs and imaging studies: CBC:  Recent Labs Lab 12/28/16 2000 12/30/16 2130  WBC 6.2 8.0  NEUTROABS 4.7  --   HGB 11.8* 12.1*  HCT 40.4 40.9  MCV 99.3 97.8  PLT 249 326   Basic Metabolic Panel:  Recent Labs Lab 12/28/16 2000  NA 138  K 4.7  CL 96*  CO2 29  GLUCOSE 93  BUN 41*  CREATININE 6.79*  CALCIUM 9.0   GFR: Estimated Creatinine Clearance: 7.4 mL/min (A) (by C-G formula based on SCr of 6.79 mg/dL (H)).  Liver Function Tests: No results for input(s): AST, ALT, ALKPHOS, BILITOT, PROT, ALBUMIN in the last 168 hours. No results for input(s): LIPASE, AMYLASE in the last 168 hours. No results for input(s): AMMONIA in the last 168 hours. Coagulation Profile: No results for input(s): INR, PROTIME in the last 168 hours. Cardiac Enzymes: No results for input(s): CKTOTAL, CKMB, CKMBINDEX, TROPONINI in the last 168 hours. BNP (last 3 results) No results for input(s): PROBNP in the last 8760 hours. HbA1C: No results for input(s): HGBA1C in the last 72 hours. CBG: No results for input(s): GLUCAP in the last 168 hours. Lipid Profile: No results for input(s): CHOL, HDL, LDLCALC, TRIG, CHOLHDL, LDLDIRECT in the last 72 hours. Thyroid Function Tests: No results for input(s): TSH, T4TOTAL, FREET4, T3FREE,  THYROIDAB in the last 72 hours. Anemia Panel: No results for input(s): VITAMINB12, FOLATE, FERRITIN, TIBC, IRON, RETICCTPCT in the last 72 hours. Sepsis Labs: Invalid input(s): PROCALCITONIN, LACTICIDVEN Recent Results (from the past 240 hour(s))  MRSA PCR Screening     Status: None   Collection Time: 12/22/16 10:47 PM  Result Value Ref Range Status   MRSA by PCR NEGATIVE NEGATIVE Final    Comment:        The GeneXpert MRSA Assay (FDA approved for NASAL specimens only), is one component of a comprehensive MRSA colonization surveillance program. It is not intended to diagnose MRSA infection nor to guide or monitor treatment for MRSA infections.          Radiological Exams on Admission: Personally reviewed MR report from last week.       Assessment/Plan Principal Problem:   Osteomyelitis (HCC) Active Problems:   End stage renal disease, has been peritoneal, now hemodialysis   Anemia of chronic disease   Chronic combined systolic and diastolic CHF (congestive heart failure) (HCC)   Status post above knee amputation of left lower extremity (HCC)   Benign essential HTN   AF (paroxysmal atrial fibrillation) ([place])  1. Osteomyelitis with intractable pain:  I attribute this pain to his osteomyelitis, which is being treated with antibiotics but perhaps undertreated with only "cortisone".   -Schedule acetaminophen -Hydromorphone IV, dose-finding first, then q3hrs -Continue vanc, Tressie Ellis -Consult to VVS, appreciate cares    2. ESRD on HD MWF:  Last treatment today, incomplete due to pain.  No signs fo fluid overload now. -Continue sensipar, renvela -BMP tomorrow -Renal diet -Will discuss maintenance HD if he stays until Wednesday  3. Hypertension, CAD, NICM, PVD:  -Continue  amlodipine, lisinopril, Imdur, hydralazine -Continue Lipitor  4. Atrial fibrillation:  CHADs2Vasc 6, not on AC because of GIB.  5. Other medications:  -Continue gabapentin -Continue eye  drops -Continue PPI      DVT prophylaxis: Heparin  Code Status: FULL  Family Communication: Daughter at bedside  Disposition Plan: Anticipate IV pain medication overnight, transition to [employer] tomorrow with teaching on how to take Percocet before discharge.  If VVS feel that he should stay until surgery, may need inpatient status and surgery Consults called: VVS Admission status: OBS At the point of initial evaluation, it is my clinical opinion that admission for OBSERVATION is reasonable and necessary because the patient's presenting complaints in the context of their chronic conditions represent sufficient risk of deterioration or significant morbidity to constitute reasonable grounds for close observation in the hospital setting, but that the patient may be medically stable for discharge from the hospital within 24 to 48 hours.    Medical decision making: Patient seen at 11:19 PM on 12/30/2016.  The patient was discussed with Antonietta Breach, PA_C.  What exists of the patient's chart was reviewed in depth and summarized above.  Clinical condition: stable.        [name] [facility] Pager [phone]

## 2016-12-30 NOTE — ED Triage Notes (Signed)
Pt is having pain to right BKA x 3 weeks. Pt reports stump is red and swollen  And he feels like there is an infection

## 2016-12-30 NOTE — ED Provider Notes (Signed)
[facility] DEPT Provider Note   CSN: [id number] Arrival date & time: 12/30/16  1434   History   Chief Complaint Chief Complaint  Patient presents with  . Leg Pain    HPI Randy [name] is a 83 y.o. male.   83 year old male with history of CAD, ESRD (M/W/F dialysis, incomplete dialysis today), hypertension, dyslipidemia, NICM, and PVD presents to the emergency department for worsening pain to his right BKA stump. Patient had right BKA performed on 01/05/2015. He has been experiencing worsening pain to the end of his stump over the past 2-3 weeks. Patient was found to have findings concerning for early osteomyelitis on MRI a little over one week ago. He was admitted for IV antibiotics. He has a right AKA scheduled with vascular surgery in 3 days. Patient was seen 2 days ago at [facility] for worsening pain. He was discharged with Percocet after a reassuring x-ray and blood work. Patient has had no fevers since he was last seen. He continues to complain of pain in his right stump site which radiates proximally. He last took a Percocet this AM, though states that this has not been controlling his pain. He has been taking 1 tablet every 6 hours. He does report continued antibiotic infusions at dialysis. He had an incomplete dialysis session today secondary to worsening pain.     Past Medical History:  Diagnosis Date  . Anemia of chronic disease   . Arthritis   . CAD (coronary artery disease)    Moderate multivessel disease 07/2015 - managed medically  . ESRD on hemodialysis [facility])    M/W/F in [place] - Dr. Florene Glen  . Essential hypertension   . Gout   . History of blood transfusion   . History of myopericarditis 2015   07/2015  . History of pneumonia 2014  . History of stroke   . Hypercholesterolemia   . Nonischemic cardiomyopathy (HCC)    LVEF 35-40%  . Peripheral vascular disease ([place])    Status post right below knee amputation for a nonhealing wound of the right  foot 12/2014  . Pneumonia     Patient Active Problem List   Diagnosis Date Noted  . Amputation stump infection ([place]) 12/23/2016  . Osteomyelitis ([place]) 12/23/2016  . Atrial fibrillation ([place]) 08/19/2016  . Hypertriglyceridemia   . Hypokalemia   . Hypomagnesemia   . Gastrointestinal hemorrhage associated with anorectal source   . CAD in native artery   . Aortic stenosis   . Coronary artery disease involving native coronary artery of native heart with angina pectoris ([place])   . Hyperlipidemia   . Cardiomyopathy, ischemic   . GI bleed 03/03/2016  . Hyperglycemia 03/03/2016  . GI bleeding 03/03/2016  . Elevated troponin   . Hypertensive urgency 11/09/2015  . Hypertensive urgency, malignant 11/08/2015  . Malnutrition of moderate degree 11/08/2015  . Adjustment disorder with depressed mood   . Constipation due to pain medication   . Slow transit constipation   . Unilateral complete BKA ([place]) 10/23/2015  . Labile blood pressure   . Muscle stiffness   . S/P AKA (above knee amputation) unilateral ([place])   . History of right above knee amputation ([place])   . Benign essential HTN   . Unilateral AKA ([place]) 10/12/2015  . Abnormality of gait   . Status post above knee amputation of left lower extremity ([place])   . Chronic combined systolic and diastolic congestive heart failure ([place])   . ESRD on dialysis ([place])   .  Essential hypertension   . Post-operative pain   . Peripheral vascular disease with pain at rest [facility]) 10/10/2015  . Chest pain 08/04/2015  . PD catheter dysfunction ([place]) 03/13/2015  . Cellulitis and abscess of foot   . Kidney mass   . Other emphysema ([place])   . Symptomatic anemia   . Physical deconditioning   . S/P BKA (below knee amputation) unilateral ([place])   . Renal mass 01/01/2015  . Foot pain 12/30/2014  . Foot pain, right 12/30/2014  . CHF (congestive heart failure) ([place]) 12/30/2014  . CAD (coronary artery disease) 12/30/2014  . Hyperkalemia 12/30/2014  . ESRD (end  stage renal disease) on dialysis ([place])   . Blood loss anemia   . Acute upper GI bleed 09/21/2014  . Fluid overload 08/29/2014  . Acute on chronic combined systolic and diastolic CHF (congestive heart failure) ([place]) 08/29/2014  . Acute respiratory failure with hypoxia ([place]) 08/29/2014  . Acute respiratory failure with hypoxemia ([place]) 08/29/2014  . Acute myopericarditis 08/18/2014  . Anemia of chronic disease   . End stage renal disease, has been peritoneal, now hemodialysis 07/28/2012  . Secondary cardiomyopathy ([place]) 06/26/2012  . HLD (hyperlipidemia) 12/10/2006  . Hypertensive heart disease 08/18/2006  . OSTEOARTHRITIS 08/18/2006  . Polycystic kidney 08/18/2006    Past Surgical History:  Procedure Laterality Date  . ABOVE KNEE LEG AMPUTATION Left 10/10/2015  . AMPUTATION Right 01/05/2015   BKA  . AMPUTATION Left 10/10/2015   Procedure: AMPUTATION ABOVE KNEE LEFT;  Surgeon: Angelia Mould, MD;  Location: [place];  Service: Vascular;  Laterality: Left;  . AV FISTULA PLACEMENT  08/24/2012   Procedure: ARTERIOVENOUS (AV) FISTULA CREATION;  Surgeon: Rosetta Posner, MD;  Location: [facility];  Service: Vascular;  Laterality: Left;  . BACK SURGERY    . CAPD REMOVAL N/A 03/13/2015   Procedure: CONTINUOUS AMBULATORY PERITONEAL DIALYSIS  (CAPD) CATHETER REMOVAL;  Surgeon: Coralie Keens, MD;  Location: [place];  Service: General;  Laterality: N/A;  . CARDIAC CATHETERIZATION    . CHOLECYSTECTOMY    . COLONOSCOPY     remote past by Dr. Tamala Julian in [place], [place]   . COLONOSCOPY Left 09/26/2014   Dr. Paulita Fujita: old and semi-fresh blood scattered throughout the colon, several medium-sized diverticula, no obvious focal bleeding site, no blood in TI, several polyps seen in cecum, ascending colon and proximal transverse s/p biopsy (tubular adenomas)  . COLONOSCOPY N/A 03/05/2016   Dr. Oneida Alar: old blood in ileum, few fresh clots ascending colon, frequent large mouth diverticula and old clots in  descending/sigmoid. 8 polyps in ascending colon not manipulated  . ESOPHAGOGASTRODUODENOSCOPY N/A 03/05/2016   Dr. Oneida Alar: hiatal hernia, mild gastritis   . GANGLION CYST EXCISION  01/03/2012   Procedure: REMOVAL GANGLION OF WRIST;  Surgeon: Scherry Ran, MD;  Location: [facility];  Service: General;  Laterality: Right;  . GIVENS CAPSULE STUDY N/A 03/07/2016   [place]: dark material in ileum, with fresh blood in right colon   . HEMORRHOID SURGERY  1970's  . INSERTION OF DIALYSIS CATHETER Right     neck  . INSERTION OF DIALYSIS CATHETER  10/19/2012   Procedure: INSERTION OF DIALYSIS CATHETER;  Surgeon: Rosetta Posner, MD;  Location: [facility];  Service: Vascular;  Laterality: N/A;  [place] ARTHROSCOPY Right 2007  . LEFT HEART CATHETERIZATION WITH CORONARY ANGIOGRAM N/A 08/17/2014   Procedure: LEFT HEART CATHETERIZATION WITH CORONARY ANGIOGRAM;  Surgeon: Larey Dresser, MD;  Location[facility];  Service: Cardiovascular;  Laterality: N/A;  . [place] SURGERY  2004       Home Medications    Prior to Admission medications   Medication Sig Start Date End Date Taking? Authorizing Provider  amLODipine (NORVASC) 5 MG tablet Take 5 mg by mouth daily.   Yes Historical Provider, MD  atorvastatin (LIPITOR) 80 MG tablet Take 1 tablet (80 mg total) by mouth daily. Patient taking differently: Take 80 mg by mouth daily after supper.  11/02/15  Yes Arnoldo Lenis, MD  brimonidine (ALPHAGAN) 0.2 % ophthalmic solution Place 1 drop into both eyes 2 (two) times daily.   Yes Historical Provider, MD  cefTAZidime 2 g in dextrose 5 % 50 mL Inject 2 g into the vein every Monday, Wednesday, and Friday at 8 PM. 12/25/16  Yes Geradine Girt, DO  cinacalcet (SENSIPAR) 60 MG tablet Take 60 mg by mouth daily after supper.    Yes Historical Provider, MD  diclofenac sodium (VOLTAREN) 1 % GEL Apply 1 application topically 3 (three) times daily as needed (pain).  12/12/16  Yes Historical Provider, MD   docusate sodium (COLACE) 100 MG capsule Take 2 capsules (200 mg total) by mouth daily. Patient taking differently: Take 200-300 mg by mouth daily as needed for mild constipation or moderate constipation.  10/31/15  Yes Ivan Anchors Love, PA-C  feeding supplement (BOOST / RESOURCE BREEZE) LIQD Take 1 Container by mouth 4 (four) times daily -  with meals and at bedtime. Patient taking differently: Take 1 Container by mouth daily.  03/14/16  Yes Lavina Hamman, MD  gabapentin (NEURONTIN) 300 MG capsule Take 300 mg by mouth at bedtime.   Yes Historical Provider, MD  hydrALAZINE (APRESOLINE) 50 MG tablet Take 50 mg by mouth 2 (two) times daily as needed (high blood pressure >160/80).    Yes Historical Provider, MD  HYDROcodone-acetaminophen (NORCO/VICODIN) 5-325 MG tablet Take 1 tablet by mouth every 4 (four) hours as needed (pain). 12/24/16  Yes Geradine Girt, DO  isosorbide mononitrate (IMDUR) 30 MG 24 hr tablet TAKE 1/2 TABLET BY MOUTH AT BEDTIME. 07/10/16  Yes Arnoldo Lenis, MD  ketoconazole (NIZORAL) 2 % cream Apply 1 application topically daily as needed for irritation (itching).  11/07/16  Yes Historical Provider, MD  lidocaine-prilocaine (EMLA) cream Apply 1 application topically every Monday, Wednesday, and Friday. For dialysis 10/06/15  Yes Historical Provider, MD  lisinopril (PRINIVIL,ZESTRIL) 40 MG tablet Take 40 mg by mouth daily. 12/03/16  Yes Historical Provider, MD  nitroGLYCERIN (NITROSTAT) 0.4 MG SL tablet Place 1 tablet (0.4 mg total) under the tongue every 5 (five) minutes as needed for chest pain. 12/20/15  Yes Ripudeep Krystal Eaton, MD  oxyCODONE-acetaminophen (PERCOCET/ROXICET) 5-325 MG tablet Take 1 tablet by mouth every 6 (six) hours as needed. 12/28/16  Yes Lily Kocher, PA-C  pantoprazole (PROTONIX) 40 MG tablet Take 1 tablet (40 mg total) by mouth daily at 12 noon. 10/23/15  Yes Barton Dubois, MD  sevelamer carbonate (RENVELA) 800 MG tablet Take 1,600-2,400 mg by mouth See admin instructions.  Take 3 tablets (2400 mg) with meals and 2 tablets (1600 mg) with snacks   Yes Historical Provider, MD  timolol (BETIMOL) 0.5 % ophthalmic solution Place 1 drop into both eyes 2 (two) times daily.   Yes Historical Provider, MD  Vancomycin (VANCOCIN) 750-5 MG/150ML-% SOLN Inject 150 mLs (750 mg total) into the vein every Monday, Wednesday, and Friday with hemodialysis. 12/25/16  Yes Geradine Girt, DO  Family History Family History  Problem Relation Age of Onset  . Stroke Mother   . Arthritis    . Cancer    . Kidney disease    . Cancer Sister   . Colon cancer Brother     unclear   . Colon cancer Brother   . Anesthesia problems Neg Hx   . Hypotension Neg Hx   . Malignant hyperthermia Neg Hx   . Pseudochol deficiency Neg Hx     Social History Social History  Substance Use Topics  . Smoking status: Former Smoker    Packs/day: 1.00    Years: 30.00    Types: Cigarettes    Quit date: 08/19/1998  . Smokeless tobacco: Former Systems developer    Types: Chew    Quit date: 12/31/1993  . Alcohol use No     Allergies   Patient has no known allergies.   Review of Systems Review of Systems Ten systems reviewed and are negative for acute change, except as noted in the HPI.    Physical Exam Updated Vital Signs BP 107/69   Pulse 74   Temp 98.8 F (37.1 C) (Oral)   Resp 15   Wt 65.8 kg   SpO2 99%   BMI 23.40 kg/m   Physical Exam  Constitutional: He is oriented to person, place, and time. He appears well-developed and well-nourished. No distress.  Nontoxic and in NAD; does appear uncomfortable.  HENT:  Head: Normocephalic and atraumatic.  Eyes: Conjunctivae and EOM are normal. No scleral icterus.  Neck: Normal range of motion.  Cardiovascular: Normal rate.   Pulmonary/Chest: Effort normal. No respiratory distress.  Respirations even and unlabored  Musculoskeletal: He exhibits tenderness (distal end of right BKA stump).  Eschar to the right BKA stump. Mild surrounding erythema, but  no significant heat to touch. No induration. Left AKA stump noted.  Neurological: He is alert and oriented to person, place, and time. He exhibits normal muscle tone. Coordination normal.  Skin: Skin is warm and dry. No rash noted. He is not diaphoretic. No erythema. No pallor.  Psychiatric: He has a normal mood and affect. His behavior is normal.  Nursing note and vitals reviewed.   ED Treatments / Results  Labs (all labs ordered are listed, but only abnormal results are displayed) Labs Reviewed  CBC - Abnormal; Notable for the following:       Result Value   RBC 4.18 (*)    Hemoglobin 12.1 (*)    MCHC 29.6 (*)    RDW 19.9 (*)    All other components within normal limits    EKG  EKG Interpretation None       Radiology No results found.  Procedures Procedures (including critical care time)  Medications Ordered in ED Medications  fentaNYL (SUBLIMAZE) injection 50 mcg (50 mcg Intramuscular Given 12/30/16 2111)  oxyCODONE-acetaminophen (PERCOCET/ROXICET) 5-325 MG per tablet 1 tablet (1 tablet Oral Given 12/30/16 2110)  morphine 4 MG/ML injection 2 mg (2 mg Intramuscular Given 12/30/16 2159)     Initial Impression / Assessment and Plan / ED Course  I have reviewed the triage vital signs and the nursing notes.  Pertinent labs & imaging results that were available during my care of the patient were reviewed by me and considered in my medical decision making (see chart for details).     9:10 PM Patient presenting for pain to right BKA stump. There is concern for underlying osteomyelitis on MRI ~1 week ago. Patient reports no improvement in pain  with Percocet. Seen for similar symptoms on 12/28/16.   Right BKA site, compared to prior note on 12/28/16, appears stable. No signs of acute or worsening infection on exam. Patient afebrile. He reports compliance with outpatient antibiotic infusions. Will check CBC to trend WBC. IM Fentanyl and 1 tablet of percocet ordered.  9:56  PM Patient reassessed. Still complaining of pain. Family complaining about medications given. State, "While he was in the hospital they gave him morphine. I thought you were going to give him morphine." I explained that Fentanyl and morphine are very similar medications. Will add dose of IM morphine.   10:11 PM Consult placed to [facility] to discuss plan of care.  10:20 PM Case discussed with Dr. Oneida Alar. He is unsure of what the operative schedule is for Dr. Scot Dock, but recommends hospitalist admission for pain control if this continues to be an issue. [facility] with plans to see in the AM in consultation.   10:55 PM Dr. Loleta Books at bedside to evaluate for admission.   Final Clinical Impressions(s) / ED Diagnoses   Final diagnoses:  Osteomyelitis of right tibia, unspecified type ([place])  Complication of amputation stump of right lower extremity [facility])    New Prescriptions New Prescriptions   No medications on file     Antonietta Breach, PA-C 12/30/16 [place], MD 01/01/17 [id number]

## 2016-12-31 ENCOUNTER — Encounter (HOSPITAL_BASED_OUTPATIENT_CLINIC_OR_DEPARTMENT_OTHER): Payer: Medicare Other

## 2016-12-31 ENCOUNTER — Observation Stay (HOSPITAL_COMMUNITY): Payer: Medicare Other | Admitting: Certified Registered Nurse Anesthetist

## 2016-12-31 ENCOUNTER — Telehealth: Payer: Self-pay | Admitting: Vascular Surgery

## 2016-12-31 ENCOUNTER — Encounter (HOSPITAL_COMMUNITY): Payer: Self-pay | Admitting: General Practice

## 2016-12-31 ENCOUNTER — Encounter (HOSPITAL_COMMUNITY)
Admission: EM | Disposition: A | Discharge status: Home Health | Payer: Self-pay | Source: Home / Self Care | Attending: Internal Medicine

## 2016-12-31 DIAGNOSIS — Z8673 Personal history of transient ischemic attack (TIA), and cerebral infarction without residual deficits: Secondary | ICD-10-CM | POA: Diagnosis not present

## 2016-12-31 DIAGNOSIS — M86161 Other acute osteomyelitis, right tibia and fibula: Secondary | ICD-10-CM | POA: Diagnosis not present

## 2016-12-31 DIAGNOSIS — M869 Osteomyelitis, unspecified: Secondary | ICD-10-CM | POA: Diagnosis not present

## 2016-12-31 DIAGNOSIS — Z8701 Personal history of pneumonia (recurrent): Secondary | ICD-10-CM | POA: Diagnosis not present

## 2016-12-31 DIAGNOSIS — Z89611 Acquired absence of right leg above knee: Secondary | ICD-10-CM | POA: Diagnosis not present

## 2016-12-31 DIAGNOSIS — Z87891 Personal history of nicotine dependence: Secondary | ICD-10-CM | POA: Diagnosis not present

## 2016-12-31 DIAGNOSIS — Z993 Dependence on wheelchair: Secondary | ICD-10-CM | POA: Diagnosis not present

## 2016-12-31 DIAGNOSIS — I132 Hypertensive heart and chronic kidney disease with heart failure and with stage 5 chronic kidney disease, or end stage renal disease: Secondary | ICD-10-CM | POA: Diagnosis not present

## 2016-12-31 DIAGNOSIS — Z89612 Acquired absence of left leg above knee: Secondary | ICD-10-CM | POA: Diagnosis not present

## 2016-12-31 DIAGNOSIS — I2510 Atherosclerotic heart disease of native coronary artery without angina pectoris: Secondary | ICD-10-CM | POA: Diagnosis not present

## 2016-12-31 DIAGNOSIS — K449 Diaphragmatic hernia without obstruction or gangrene: Secondary | ICD-10-CM | POA: Diagnosis present

## 2016-12-31 DIAGNOSIS — E8889 Other specified metabolic disorders: Secondary | ICD-10-CM | POA: Diagnosis present

## 2016-12-31 DIAGNOSIS — T8789 Other complications of amputation stump: Secondary | ICD-10-CM | POA: Diagnosis not present

## 2016-12-31 DIAGNOSIS — I5042 Chronic combined systolic (congestive) and diastolic (congestive) heart failure: Secondary | ICD-10-CM | POA: Diagnosis not present

## 2016-12-31 DIAGNOSIS — Z79899 Other long term (current) drug therapy: Secondary | ICD-10-CM | POA: Diagnosis not present

## 2016-12-31 DIAGNOSIS — Z992 Dependence on renal dialysis: Secondary | ICD-10-CM | POA: Diagnosis not present

## 2016-12-31 DIAGNOSIS — I480 Paroxysmal atrial fibrillation: Secondary | ICD-10-CM | POA: Diagnosis not present

## 2016-12-31 DIAGNOSIS — E785 Hyperlipidemia, unspecified: Secondary | ICD-10-CM | POA: Diagnosis present

## 2016-12-31 DIAGNOSIS — I428 Other cardiomyopathies: Secondary | ICD-10-CM | POA: Diagnosis not present

## 2016-12-31 DIAGNOSIS — I10 Essential (primary) hypertension: Secondary | ICD-10-CM | POA: Diagnosis not present

## 2016-12-31 DIAGNOSIS — Y838 Other surgical procedures as the cause of abnormal reaction of the patient, or of later complication, without mention of misadventure at the time of the procedure: Secondary | ICD-10-CM | POA: Diagnosis present

## 2016-12-31 DIAGNOSIS — N2581 Secondary hyperparathyroidism of renal origin: Secondary | ICD-10-CM | POA: Diagnosis not present

## 2016-12-31 DIAGNOSIS — T8743 Infection of amputation stump, right lower extremity: Secondary | ICD-10-CM | POA: Diagnosis not present

## 2016-12-31 DIAGNOSIS — D638 Anemia in other chronic diseases classified elsewhere: Secondary | ICD-10-CM | POA: Diagnosis not present

## 2016-12-31 DIAGNOSIS — T879 Unspecified complications of amputation stump: Secondary | ICD-10-CM | POA: Diagnosis present

## 2016-12-31 DIAGNOSIS — N186 End stage renal disease: Secondary | ICD-10-CM | POA: Diagnosis not present

## 2016-12-31 DIAGNOSIS — I739 Peripheral vascular disease, unspecified: Secondary | ICD-10-CM | POA: Diagnosis present

## 2016-12-31 DIAGNOSIS — M1990 Unspecified osteoarthritis, unspecified site: Secondary | ICD-10-CM | POA: Diagnosis not present

## 2016-12-31 DIAGNOSIS — I429 Cardiomyopathy, unspecified: Secondary | ICD-10-CM | POA: Diagnosis not present

## 2016-12-31 HISTORY — PX: AMPUTATION: SHX166

## 2016-12-31 LAB — CBC
HEMATOCRIT: 37.3 % — AB (ref 39.0–52.0)
Hemoglobin: 10.8 g/dL — ABNORMAL LOW (ref 13.0–17.0)
MCH: 28.3 pg (ref 26.0–34.0)
MCHC: 29 g/dL — AB (ref 30.0–36.0)
MCV: 97.6 fL (ref 78.0–100.0)
Platelets: 256 10*3/uL (ref 150–400)
RBC: 3.82 MIL/uL — ABNORMAL LOW (ref 4.22–5.81)
RDW: 19.8 % — AB (ref 11.5–15.5)
WBC: 7.7 10*3/uL (ref 4.0–10.5)

## 2016-12-31 LAB — COMPREHENSIVE METABOLIC PANEL
ALBUMIN: 2.7 g/dL — AB (ref 3.5–5.0)
ALT: 13 U/L — AB (ref 17–63)
AST: 13 U/L — ABNORMAL LOW (ref 15–41)
Alkaline Phosphatase: 66 U/L (ref 38–126)
Anion gap: 13 (ref 5–15)
BILIRUBIN TOTAL: 0.5 mg/dL (ref 0.3–1.2)
BUN: 41 mg/dL — AB (ref 6–20)
CHLORIDE: 98 mmol/L — AB (ref 101–111)
CO2: 26 mmol/L (ref 22–32)
Calcium: 9 mg/dL (ref 8.9–10.3)
Creatinine, Ser: 7.54 mg/dL — ABNORMAL HIGH (ref 0.61–1.24)
GFR calc Af Amer: 7 mL/min — ABNORMAL LOW (ref >60)
GFR calc non Af Amer: 6 mL/min — ABNORMAL LOW (ref >60)
GLUCOSE: 88 mg/dL (ref 65–99)
POTASSIUM: 4.8 mmol/L (ref 3.5–5.1)
Sodium: 137 mmol/L (ref 135–145)
TOTAL PROTEIN: 6.2 g/dL — AB (ref 6.5–8.1)

## 2016-12-31 LAB — PROTIME-INR
INR: 1.1
Prothrombin Time: 14.3 seconds (ref 11.4–15.2)

## 2016-12-31 LAB — APTT: aPTT: 62 seconds — ABNORMAL HIGH (ref 24–36)

## 2016-12-31 LAB — SURGICAL PCR SCREEN
MRSA, PCR: NEGATIVE
Staphylococcus aureus: NEGATIVE

## 2016-12-31 LAB — PHOSPHORUS: Phosphorus: 5.9 mg/dL — ABNORMAL HIGH (ref 2.5–4.6)

## 2016-12-31 SURGERY — AMPUTATION, ABOVE KNEE
Anesthesia: General | Site: Leg Upper | Laterality: Right

## 2016-12-31 MED ORDER — CEFTAZIDIME 2 G IJ SOLR
2.0000 g | INTRAMUSCULAR | Status: DC
  Administered 2017-01-01 – 2017-01-03 (×2): 2 g via INTRAVENOUS
  Filled 2016-12-31 (×2): qty 2

## 2016-12-31 MED ORDER — ALUM & MAG HYDROXIDE-SIMETH 200-200-20 MG/5ML PO SUSP: 15.0000 mL | ORAL | Status: DC | PRN

## 2016-12-31 MED ORDER — METOPROLOL TARTRATE 5 MG/5ML IV SOLN: 2.0000 mg | INTRAVENOUS | Status: DC | PRN

## 2016-12-31 MED ORDER — HYDRALAZINE HCL 50 MG PO TABS: 50.0000 mg | ORAL_TABLET | Freq: Two times a day (BID) | ORAL | Status: DC | PRN

## 2016-12-31 MED ORDER — CINACALCET HCL 30 MG PO TABS
60.0000 mg | ORAL_TABLET | Freq: Every day | ORAL | Status: DC
  Administered 2016-12-31 – 2017-01-03 (×3): 60 mg via ORAL
  Filled 2016-12-31 (×3): qty 2

## 2016-12-31 MED ORDER — TIMOLOL HEMIHYDRATE 0.5 % OP SOLN: 1.0000 [drp] | Freq: Two times a day (BID) | OPHTHALMIC | Status: DC

## 2016-12-31 MED ORDER — BOOST / RESOURCE BREEZE PO LIQD: 1.0000 | Freq: Every day | ORAL | Status: DC

## 2016-12-31 MED ORDER — SEVELAMER CARBONATE 800 MG PO TABS: 1600.0000 mg | ORAL_TABLET | ORAL | Status: DC

## 2016-12-31 MED ORDER — PHENOL 1.4 % MT LIQD: 1.0000 | OROMUCOSAL | Status: DC | PRN

## 2016-12-31 MED ORDER — SODIUM CHLORIDE 0.9 % IV SOLN
INTRAVENOUS | Status: DC
  Administered 2016-12-31: 09:00:00 via INTRAVENOUS

## 2016-12-31 MED ORDER — RENA-VITE PO TABS
1.0000 | ORAL_TABLET | Freq: Every day | ORAL | Status: DC
  Administered 2016-12-31 – 2017-01-03 (×4): 1 via ORAL
  Filled 2016-12-31 (×4): qty 1

## 2016-12-31 MED ORDER — POTASSIUM CHLORIDE CRYS ER 20 MEQ PO TBCR: 20.0000 meq | EXTENDED_RELEASE_TABLET | Freq: Every day | ORAL | Status: DC | PRN

## 2016-12-31 MED ORDER — GUAIFENESIN-DM 100-10 MG/5ML PO SYRP: 15.0000 mL | ORAL_SOLUTION | ORAL | Status: DC | PRN

## 2016-12-31 MED ORDER — OXYCODONE-ACETAMINOPHEN 5-325 MG PO TABS
1.0000 | ORAL_TABLET | ORAL | Status: DC | PRN
  Administered 2016-12-31 – 2017-01-01 (×5): 2 via ORAL
  Administered 2017-01-01: 1 via ORAL
  Administered 2017-01-02 – 2017-01-04 (×4): 2 via ORAL
  Filled 2016-12-31 (×4): qty 2
  Filled 2016-12-31: qty 1
  Filled 2016-12-31 (×5): qty 2

## 2016-12-31 MED ORDER — ACETAMINOPHEN 500 MG PO TABS
1000.0000 mg | ORAL_TABLET | Freq: Three times a day (TID) | ORAL | Status: DC
  Administered 2016-12-31 – 2017-01-03 (×9): 1000 mg via ORAL
  Filled 2016-12-31 (×10): qty 2

## 2016-12-31 MED ORDER — PROPOFOL 10 MG/ML IV BOLUS
INTRAVENOUS | Status: AC
  Filled 2016-12-31: qty 20

## 2016-12-31 MED ORDER — AMLODIPINE BESYLATE 5 MG PO TABS
5.0000 mg | ORAL_TABLET | Freq: Every day | ORAL | Status: DC
  Administered 2017-01-01 – 2017-01-02 (×2): 5 mg via ORAL
  Filled 2016-12-31 (×3): qty 1

## 2016-12-31 MED ORDER — DEXTROSE 5 % IV SOLN
INTRAVENOUS | Status: AC
  Filled 2016-12-31: qty 1.5

## 2016-12-31 MED ORDER — DOCUSATE SODIUM 100 MG PO CAPS
100.0000 mg | ORAL_CAPSULE | Freq: Every day | ORAL | Status: DC
Start: 2017-01-01 — End: 2017-01-04
  Administered 2017-01-01 – 2017-01-03 (×3): 100 mg via ORAL
  Filled 2016-12-31 (×4): qty 1

## 2016-12-31 MED ORDER — VANCOMYCIN HCL IN DEXTROSE 750-5 MG/150ML-% IV SOLN
750.0000 mg | INTRAVENOUS | Status: DC
  Administered 2017-01-01 – 2017-01-03 (×2): 750 mg via INTRAVENOUS
  Filled 2016-12-31 (×2): qty 150

## 2016-12-31 MED ORDER — DEXTROSE 5 % IV SOLN: 1.5000 g | Freq: Two times a day (BID) | INTRAVENOUS | Status: DC

## 2016-12-31 MED ORDER — PROPOFOL 10 MG/ML IV BOLUS
INTRAVENOUS | Status: DC | PRN
  Administered 2016-12-31: 80 mg via INTRAVENOUS
  Administered 2016-12-31: 40 mg via INTRAVENOUS

## 2016-12-31 MED ORDER — DEXTROSE 5 % IV SOLN
INTRAVENOUS | Status: DC | PRN
  Administered 2016-12-31: 30 ug/min via INTRAVENOUS

## 2016-12-31 MED ORDER — ONDANSETRON HCL 4 MG PO TABS: 4.0000 mg | ORAL_TABLET | Freq: Four times a day (QID) | ORAL | Status: DC | PRN

## 2016-12-31 MED ORDER — ATORVASTATIN CALCIUM 80 MG PO TABS
80.0000 mg | ORAL_TABLET | Freq: Every day | ORAL | Status: DC
  Administered 2016-12-31 – 2017-01-03 (×4): 80 mg via ORAL
  Filled 2016-12-31 (×4): qty 1

## 2016-12-31 MED ORDER — ACETAMINOPHEN 325 MG PO TABS: 325.0000 mg | ORAL_TABLET | ORAL | Status: DC | PRN

## 2016-12-31 MED ORDER — PHENYLEPHRINE HCL 10 MG/ML IJ SOLN
INTRAMUSCULAR | Status: DC | PRN
  Administered 2016-12-31: 80 ug via INTRAVENOUS

## 2016-12-31 MED ORDER — BRIMONIDINE TARTRATE 0.2 % OP SOLN
1.0000 [drp] | Freq: Two times a day (BID) | OPHTHALMIC | Status: DC
  Administered 2016-12-31 – 2017-01-03 (×6): 1 [drp] via OPHTHALMIC
  Filled 2016-12-31: qty 5

## 2016-12-31 MED ORDER — NEPRO/CARBSTEADY PO LIQD
237.0000 mL | Freq: Two times a day (BID) | ORAL | Status: DC
  Administered 2016-12-31 – 2017-01-03 (×4): 237 mL via ORAL
  Filled 2016-12-31 (×11): qty 237

## 2016-12-31 MED ORDER — ONDANSETRON HCL 4 MG/2ML IJ SOLN
INTRAMUSCULAR | Status: DC | PRN
  Administered 2016-12-31: 4 mg via INTRAVENOUS

## 2016-12-31 MED ORDER — MAGNESIUM SULFATE 2 GM/50ML IV SOLN
2.0000 g | Freq: Every day | INTRAVENOUS | Status: DC | PRN
  Filled 2016-12-31: qty 50

## 2016-12-31 MED ORDER — ONDANSETRON HCL 4 MG/2ML IJ SOLN
INTRAMUSCULAR | Status: AC
  Filled 2016-12-31: qty 2

## 2016-12-31 MED ORDER — FENTANYL CITRATE (PF) 250 MCG/5ML IJ SOLN
INTRAMUSCULAR | Status: AC
  Filled 2016-12-31: qty 5

## 2016-12-31 MED ORDER — LIDOCAINE HCL (CARDIAC) 20 MG/ML IV SOLN
INTRAVENOUS | Status: DC | PRN
  Administered 2016-12-31: 60 mg via INTRAVENOUS

## 2016-12-31 MED ORDER — FENTANYL CITRATE (PF) 100 MCG/2ML IJ SOLN
INTRAMUSCULAR | Status: DC | PRN
  Administered 2016-12-31: 50 ug via INTRAVENOUS

## 2016-12-31 MED ORDER — GABAPENTIN 300 MG PO CAPS
300.0000 mg | ORAL_CAPSULE | Freq: Every day | ORAL | Status: DC
  Administered 2016-12-31 – 2017-01-01 (×3): 300 mg via ORAL
  Filled 2016-12-31 (×3): qty 1

## 2016-12-31 MED ORDER — 0.9 % SODIUM CHLORIDE (POUR BTL) OPTIME
TOPICAL | Status: DC | PRN
  Administered 2016-12-31: 1000 mL

## 2016-12-31 MED ORDER — CHLORHEXIDINE GLUCONATE CLOTH 2 % EX PADS
6.0000 | MEDICATED_PAD | Freq: Once | CUTANEOUS | Status: DC
Start: 2016-12-31 — End: 2016-12-31

## 2016-12-31 MED ORDER — SEVELAMER CARBONATE 800 MG PO TABS
2400.0000 mg | ORAL_TABLET | Freq: Three times a day (TID) | ORAL | Status: DC
  Administered 2016-12-31 – 2017-01-03 (×9): 2400 mg via ORAL
  Filled 2016-12-31 (×10): qty 3

## 2016-12-31 MED ORDER — TIMOLOL MALEATE 0.5 % OP SOLN
1.0000 [drp] | Freq: Two times a day (BID) | OPHTHALMIC | Status: DC
  Administered 2016-12-31 – 2017-01-03 (×6): 1 [drp] via OPHTHALMIC
  Filled 2016-12-31: qty 5

## 2016-12-31 MED ORDER — LABETALOL HCL 5 MG/ML IV SOLN
10.0000 mg | INTRAVENOUS | Status: DC | PRN
  Filled 2016-12-31: qty 4

## 2016-12-31 MED ORDER — HEPARIN SODIUM (PORCINE) 5000 UNIT/ML IJ SOLN
5000.0000 [IU] | Freq: Three times a day (TID) | INTRAMUSCULAR | Status: DC
  Administered 2016-12-31 – 2017-01-04 (×11): 5000 [IU] via SUBCUTANEOUS
  Filled 2016-12-31 (×11): qty 1

## 2016-12-31 MED ORDER — PHENYLEPHRINE 40 MCG/ML (10ML) SYRINGE FOR IV PUSH (FOR BLOOD PRESSURE SUPPORT)
PREFILLED_SYRINGE | INTRAVENOUS | Status: AC
  Filled 2016-12-31: qty 10

## 2016-12-31 MED ORDER — ISOSORBIDE MONONITRATE ER 30 MG PO TB24
15.0000 mg | ORAL_TABLET | Freq: Every day | ORAL | Status: DC
  Administered 2016-12-31 – 2017-01-03 (×5): 15 mg via ORAL
  Filled 2016-12-31 (×5): qty 1

## 2016-12-31 MED ORDER — LISINOPRIL 40 MG PO TABS
40.0000 mg | ORAL_TABLET | Freq: Every day | ORAL | Status: DC
  Administered 2017-01-01 – 2017-01-02 (×2): 40 mg via ORAL
  Filled 2016-12-31 (×4): qty 1

## 2016-12-31 MED ORDER — CALCITRIOL 0.5 MCG PO CAPS
3.0000 ug | ORAL_CAPSULE | ORAL | Status: DC
  Administered 2017-01-01 – 2017-01-03 (×2): 3 ug via ORAL

## 2016-12-31 MED ORDER — SEVELAMER CARBONATE 800 MG PO TABS: 1600.0000 mg | ORAL_TABLET | ORAL | Status: DC | PRN

## 2016-12-31 MED ORDER — BACITRACIN ZINC 500 UNIT/GM EX OINT
TOPICAL_OINTMENT | CUTANEOUS | Status: AC
  Filled 2016-12-31: qty 28.35

## 2016-12-31 MED ORDER — PANTOPRAZOLE SODIUM 40 MG PO TBEC
40.0000 mg | DELAYED_RELEASE_TABLET | Freq: Every day | ORAL | Status: DC
  Administered 2016-12-31 – 2017-01-03 (×3): 40 mg via ORAL
  Filled 2016-12-31 (×3): qty 1

## 2016-12-31 MED ORDER — ACETAMINOPHEN 650 MG RE SUPP: 325.0000 mg | RECTAL | Status: DC | PRN

## 2016-12-31 MED ORDER — DEXTROSE 5 % IV SOLN
1.5000 g | INTRAVENOUS | Status: AC
  Administered 2016-12-31: 1.5 g via INTRAVENOUS

## 2016-12-31 MED ORDER — BACITRACIN ZINC 500 UNIT/GM EX OINT
TOPICAL_OINTMENT | CUTANEOUS | Status: DC | PRN
  Administered 2016-12-31: 1 via TOPICAL

## 2016-12-31 MED ORDER — ONDANSETRON HCL 4 MG/2ML IJ SOLN: 4.0000 mg | Freq: Four times a day (QID) | INTRAMUSCULAR | Status: DC | PRN

## 2016-12-31 MED ORDER — POLYETHYLENE GLYCOL 3350 17 G PO PACK: 17.0000 g | PACK | Freq: Every day | ORAL | Status: DC | PRN

## 2016-12-31 SURGICAL SUPPLY — 58 items
BANDAGE ACE 4X5 VEL STRL LF (GAUZE/BANDAGES/DRESSINGS) ×6 IMPLANT
BANDAGE ACE 6X5 VEL STRL LF (GAUZE/BANDAGES/DRESSINGS) ×6 IMPLANT
BANDAGE ESMARK 6X9 LF (GAUZE/BANDAGES/DRESSINGS) IMPLANT
BLADE SAW RECIP 87.9 MT (BLADE) ×3 IMPLANT
BNDG COHESIVE 6X5 TAN STRL LF (GAUZE/BANDAGES/DRESSINGS) ×3 IMPLANT
BNDG ESMARK 6X9 LF (GAUZE/BANDAGES/DRESSINGS)
BNDG GAUZE ELAST 4 BULKY (GAUZE/BANDAGES/DRESSINGS) ×3 IMPLANT
CANISTER SUCT 3000ML PPV (MISCELLANEOUS) ×3 IMPLANT
CLIP TI MEDIUM 6 (CLIP) IMPLANT
COVER SURGICAL LIGHT HANDLE (MISCELLANEOUS) ×3 IMPLANT
CUFF TOURNIQUET SINGLE 18IN (TOURNIQUET CUFF) IMPLANT
CUFF TOURNIQUET SINGLE 24IN (TOURNIQUET CUFF) ×3 IMPLANT
CUFF TOURNIQUET SINGLE 34IN LL (TOURNIQUET CUFF) IMPLANT
CUFF TOURNIQUET SINGLE 44IN (TOURNIQUET CUFF) IMPLANT
DRAIN CHANNEL 19F RND (DRAIN) IMPLANT
DRAPE ORTHO SPLIT 77X108 STRL (DRAPES) ×4
DRAPE PROXIMA HALF (DRAPES) ×3 IMPLANT
DRAPE SURG ORHT 6 SPLT 77X108 (DRAPES) ×2 IMPLANT
DRAPE U-SHAPE 47X51 STRL (DRAPES) ×3 IMPLANT
DRSG ADAPTIC 3X8 NADH LF (GAUZE/BANDAGES/DRESSINGS) ×3 IMPLANT
ELECT REM PT RETURN 9FT ADLT (ELECTROSURGICAL) ×3
ELECTRODE REM PT RTRN 9FT ADLT (ELECTROSURGICAL) ×1 IMPLANT
EVACUATOR SILICONE 100CC (DRAIN) IMPLANT
GAUZE SPONGE 4X4 12PLY STRL (GAUZE/BANDAGES/DRESSINGS) ×3 IMPLANT
GLOVE BIO SURGEON STRL SZ 6.5 (GLOVE) ×2 IMPLANT
GLOVE BIO SURGEON STRL SZ7.5 (GLOVE) ×6 IMPLANT
GLOVE BIO SURGEONS STRL SZ 6.5 (GLOVE) ×1
GLOVE BIOGEL PI IND STRL 6.5 (GLOVE) ×1 IMPLANT
GLOVE BIOGEL PI IND STRL 7.0 (GLOVE) ×2 IMPLANT
GLOVE BIOGEL PI IND STRL 8 (GLOVE) ×1 IMPLANT
GLOVE BIOGEL PI INDICATOR 6.5 (GLOVE) ×2
GLOVE BIOGEL PI INDICATOR 7.0 (GLOVE) ×4
GLOVE BIOGEL PI INDICATOR 8 (GLOVE) ×2
GLOVE SURG SS PI 7.0 STRL IVOR (GLOVE) ×3 IMPLANT
GOWN STRL REUS W/ TWL LRG LVL3 (GOWN DISPOSABLE) ×4 IMPLANT
GOWN STRL REUS W/TWL LRG LVL3 (GOWN DISPOSABLE) ×8
KIT BASIN OR (CUSTOM PROCEDURE TRAY) ×3 IMPLANT
KIT ROOM TURNOVER OR (KITS) ×3 IMPLANT
NS IRRIG 1000ML POUR BTL (IV SOLUTION) ×3 IMPLANT
PACK GENERAL/GYN (CUSTOM PROCEDURE TRAY) ×3 IMPLANT
PAD ARMBOARD 7.5X6 YLW CONV (MISCELLANEOUS) ×6 IMPLANT
PADDING CAST COTTON 6X4 STRL (CAST SUPPLIES) IMPLANT
STAPLER VISISTAT (STAPLE) ×3 IMPLANT
STAPLER VISISTAT 35W (STAPLE) ×3 IMPLANT
STOCKINETTE IMPERVIOUS LG (DRAPES) ×3 IMPLANT
SUT ETHILON 3 0 PS 1 (SUTURE) IMPLANT
SUT SILK 0 TIES 10X30 (SUTURE) ×3 IMPLANT
SUT SILK 2 0 (SUTURE) ×2
SUT SILK 2 0 SH CR/8 (SUTURE) ×3 IMPLANT
SUT SILK 2-0 18XBRD TIE 12 (SUTURE) ×1 IMPLANT
SUT SILK 3 0 (SUTURE) ×2
SUT SILK 3-0 18XBRD TIE 12 (SUTURE) ×1 IMPLANT
SUT VIC AB 2-0 CT1 18 (SUTURE) ×3 IMPLANT
TAPE UMBILICAL COTTON 1/8X30 (MISCELLANEOUS) ×3 IMPLANT
TOWEL OR 17X24 6PK STRL BLUE (TOWEL DISPOSABLE) ×3 IMPLANT
TOWEL OR 17X26 10 PK STRL BLUE (TOWEL DISPOSABLE) ×3 IMPLANT
UNDERPAD 30X30 (UNDERPADS AND DIAPERS) ×3 IMPLANT
WATER STERILE IRR 1000ML POUR (IV SOLUTION) ×3 IMPLANT

## 2016-12-31 NOTE — Progress Notes (Signed)
Pt to OR via stretcher,  Alert and oriented. Family notified by pt. All prereq for surgery completed.

## 2016-12-31 NOTE — Interval H&P Note (Signed)
History and Physical Interval Note:  12/31/2016 9:44 AM  Randy [name]  has presented today for surgery, with the diagnosis of AKA/RIGHT  The various methods of treatment have been discussed with the patient and family. After consideration of risks, benefits and other options for treatment, the patient has consented to  Procedure(s): AMPUTATION ABOVE KNEE (Right) as a surgical intervention .  [name] history has been reviewed, patient examined, no change in status, stable for surgery.  I have reviewed the patient's chart and labs.  Questions were answered to the patient's satisfaction.     [name]

## 2016-12-31 NOTE — Progress Notes (Signed)
PROGRESS NOTE                                                                                                                                                                                                             Patient Demographics:    Randy [name], is a 83 y.o. male, DOB - 1932/04/15, RNH:[id number]  Admit date - 12/30/2016   Admitting Physician [name], MD  Outpatient Primary MD for the patient is [name], MD  LOS - 0  Outpatient Specialists: Dr Florene Glen  Dr Scot Dock  Chief Complaint  Patient presents with  . Leg Pain       Brief Narrative   83 y.o. male with a past medical history significant for ESRD on HD MWF, CAD, HTN, NICM, PVD s/p L AKA and right BKA, and Afib not on AC due to hx GIB who presents with leg pain. 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    For past few weeks he developed new pain in his right BKA stump with new swelling.  He was admitted here for 2 days about a week ago, MRI showed probably early osteomyelitis and cellulitis. It was discussed with ID on the  phone who recommended IV vancomycin and Fortaz, which he has been getting with HD. He was seen during that hospitalization by Dr. Donnetta Hutching who noted patient had been seen for similar symptoms back in Nov 2017 and recommended  possible AKA which was planned for near future. But since pain was unbearable, he came to the ED.   Patient admitted to hospitalist service. Taken to OR on 4/10 and underwent right AKA.    Subjective:   Patient seen after returning from OR. c/o pain over surgical site.   Assessment  & Plan :    Principal Problem: Right BKA stump? Osteomyelitis with ischemic pain Underwent rt AKA today. Post op care. Prn IV dialaudid q3h for pain.  continue vancomycin an fortaz for now. Since  amputation done may d/c abx after 48hrs.  Active Problems:   End stage renal disease,on HD ( M,W,F) Sees Dr Florene Glen. Had HD yesterday but reports unable to complete due to pain. No sings of volume  overload or uremia.renal notified for  HD tomorrow. continue sensipar and renvela    Anemia of chronic disease    Chronic combined systolic and diastolic CHF (congestive heart failure) (HCC) euvolemic. volume management with HD Continue lisinopril, imdur and hydralazine. Continue statin  Essential HTN  on amlodipine, ACEi, imdur and hydralazine      AF (paroxysmal atrial fibrillation) (HCC) Not on anticoagulation due to hx of  GI bleed.       Code Status : full code  Family Communication  : wife and daughter at bedisde  Disposition Plan  : home  Barriers For Discharge : post op  Consults  :   Vascular  renal  Procedures  :  Rt AKA on 4/10  DVT Prophylaxis  :  Lovenox -   Lab Results  Component Value Date   PLT 256 12/31/2016    Antibiotics  :    Anti-infectives    Start     Dose/Rate Route Frequency Ordered Stop   01/01/17 2000  cefTAZidime (FORTAZ) 2 g in dextrose 5 % 50 mL IVPB     2 g 100 mL/hr over 30 Minutes Intravenous Every M-W-F (2000) 12/31/16 0020     01/01/17 1200  vancomycin (VANCOCIN) IVPB 750 mg/150 ml premix     750 mg 150 mL/hr over 60 Minutes Intravenous Every M-W-F (Hemodialysis) 12/31/16 0020     12/31/16 2200  cefUROXime (ZINACEF) 1.5 g in dextrose 5 % 50 mL IVPB  Status:  Discontinued     1.5 g 100 mL/hr over 30 Minutes Intravenous Every 12 hours 12/31/16 1201 12/31/16 1229   12/31/16 0903  cefUROXime (ZINACEF) 1.5 g in dextrose 5 % 50 mL IVPB     1.5 g 100 mL/hr over 30 Minutes Intravenous 30 min pre-op 12/31/16 0903 12/31/16 1032   12/31/16 0902  dextrose 5 % with cefUROXime (ZINACEF) ADS Med    Comments:  [name]   : cabinet override      12/31/16 0902 12/31/16 1017        Objective:   Vitals:   12/31/16 1115 12/31/16 1130  12/31/16 1145 12/31/16 1228  BP: 134/68 138/63 140/68 125/76  Pulse: 80 72 80 83  Resp: 10 17 15    Temp: 97.9 F (36.6 C)  97.9 F (36.6 C) 98.1 F (36.7 C)  TempSrc:    Oral  SpO2: 100% 100% 96% 96%  Weight:      Height:        Wt Readings from Last 3 Encounters:  12/31/16 65.8 kg (145 lb)  12/28/16 65.8 kg (145 lb)  12/24/16 66.1 kg (145 lb 11.6 oz)     Intake/Output Summary (Last 24 hours) at 12/31/16 1307 Last data filed at 12/31/16 1200  Gross per 24 hour  Intake              830 ml  Output               20 ml  Net              810 ml     Physical Exam  Gen: not in distress HEENT:  moist mucosa, supple neck Chest: clear b/l, no added sounds CVS: N S1&S2, no murmurs, GI: soft, NT, ND,  Musculoskeletal: dressing over rt AKA stump, left AKA , left UE graft     Data Review:    CBC  Recent Labs Lab 12/28/16 2000 12/30/16 2130 12/31/16 0700  WBC 6.2 8.0 7.7  HGB 11.8* 12.1* 10.8*  HCT 40.4 40.9  37.3*  PLT 249 246 256  MCV 99.3 97.8 97.6  MCH 29.0 28.9 28.3  MCHC 29.2* 29.6* 29.0*  RDW 19.6* 19.9* 19.8*  LYMPHSABS 0.8  --   --   MONOABS 0.6  --   --   EOSABS 0.1  --   --   BASOSABS 0.0  --   --     Chemistries   Recent Labs Lab 12/28/16 2000 12/31/16 0700  NA 138 137  K 4.7 4.8  CL 96* 98*  CO2 29 26  GLUCOSE 93 88  BUN 41* 41*  CREATININE 6.79* 7.54*  CALCIUM 9.0 9.0  AST  --  13*  ALT  --  13*  ALKPHOS  --  66  BILITOT  --  0.5   ------------------------------------------------------------------------------------------------------------------ No results for input(s): CHOL, HDL, LDLCALC, TRIG, CHOLHDL, LDLDIRECT in the last 72 hours.  Lab Results  Component Value Date   HGBA1C 5.4 10/21/2015   ------------------------------------------------------------------------------------------------------------------ No results for input(s): TSH, T4TOTAL, T3FREE, THYROIDAB in the last 72 hours.  Invalid input(s):  FREET3 ------------------------------------------------------------------------------------------------------------------ No results for input(s): VITAMINB12, FOLATE, FERRITIN, TIBC, IRON, RETICCTPCT in the last 72 hours.  Coagulation profile  Recent Labs Lab 12/31/16 0700  INR 1.10    No results for input(s): DDIMER in the last 72 hours.  Cardiac Enzymes No results for input(s): CKMB, TROPONINI, MYOGLOBIN in the last 168 hours.  Invalid input(s): CK ------------------------------------------------------------------------------------------------------------------    Component Value Date/Time   BNP 3,090.9 (H) 10/21/2015 0936    Inpatient Medications  Scheduled Meds: . acetaminophen  1,000 mg Oral TID  . amLODipine  5 mg Oral Daily  . atorvastatin  80 mg Oral QPC supper  . brimonidine  1 drop Both Eyes BID  . [START ON 01/01/2017] ceftAZIDime (FORTAZ) IVPB 2 gram/50 mL D5W (Pyxis)  2 g Intravenous Q M,W,F-2000  . cinacalcet  60 mg Oral QPC supper  . [START ON 01/01/2017] docusate sodium  100 mg Oral Daily  . feeding supplement  1 Container Oral Daily  . gabapentin  300 mg Oral QHS  . heparin  5,000 Units Subcutaneous Q8H  . isosorbide mononitrate  15 mg Oral QHS  . lisinopril  40 mg Oral Daily  . pantoprazole  40 mg Oral Q1200  . sevelamer carbonate  2,400 mg Oral TID WC  . timolol  1 drop Both Eyes BID  . [START ON 01/01/2017] Vancomycin  750 mg Intravenous Q M,W,F-HD   Continuous Infusions: PRN Meds:.acetaminophen **OR** acetaminophen, alum & mag hydroxide-simeth, guaiFENesin-dextromethorphan, hydrALAZINE, HYDROmorphone (DILAUDID) injection, labetalol, magnesium sulfate 1 - 4 g bolus IVPB, metoprolol, ondansetron **OR** ondansetron (ZOFRAN) IV, oxyCODONE-acetaminophen, phenol, polyethylene glycol, potassium chloride, sevelamer carbonate  Micro Results Recent Results (from the past 240 hour(s))  MRSA PCR Screening     Status: None   Collection Time: 12/22/16 10:47 PM   Result Value Ref Range Status   MRSA by PCR NEGATIVE NEGATIVE Final    Comment:        The GeneXpert MRSA Assay (FDA approved for NASAL specimens only), is one component of a comprehensive MRSA colonization surveillance program. It is not intended to diagnose MRSA infection nor to guide or monitor treatment for MRSA infections.   Surgical PCR screen     Status: None   Collection Time: 12/31/16  5:00 AM  Result Value Ref Range Status   MRSA, PCR NEGATIVE NEGATIVE Final   Staphylococcus aureus NEGATIVE NEGATIVE Final    Comment:        The Xpert  SA Assay (FDA approved for NASAL specimens in patients over 39 years of age), is one component of a comprehensive surveillance program.  Test performance has been validated by [facility] for patients greater than or equal to 65 year old. It is not intended to diagnose infection nor to guide or monitor treatment.     Radiology Reports Mr Knee Right Wo Contrast  Result Date: 12/22/2016 CLINICAL DATA:  Pain and swelling of the amputation stump of the right lower extremity x2 weeks. No fever. EXAM: MRI OF THE RIGHT KNEE WITHOUT CONTRAST TECHNIQUE: Multiplanar, multisequence MR imaging of the knee was performed. No intravenous contrast was administered. COMPARISON:  Plain radiographs from 12/22/2016 FINDINGS: Susceptibility artifact is noted emanating from the posteromedial aspect of the stump. This slightly limits assessment. MENISCI Medial meniscus: Limited assessment of the posterior horn due to susceptibility artifacts adjacent to the posterior horn. No frank tear identified. Lateral meniscus:  Intact LIGAMENTS Cruciates:  Intact Collaterals: Medial collateral ligament is intact. Lateral collateral ligament complex is intact. CARTILAGE Patellofemoral: Mild chondromalacia of the lateral patellar cartilage with slight thinning of the trochlear cartilage suggested. Medial:  Irregular femorotibial partial-thickness cartilage loss. Lateral:  No  chondral defect Joint:  No joint effusion. Normal Hoffa's fat. No plical thickening. Popliteal Fossa: Limited by susceptibility artifacts. No definite popliteal cyst identified though this region is obscured by artifacts. Extensor Mechanism:  Intact quadriceps tendon and patellar tendon. Bones: Faint marrow signal abnormalities along the surgical margin of the tibial shaft amputation. Changes of early osteomyelitis of concern. Other: Extensive stump edema of the surrounding soft tissues.No focal abscess or draining sinuses. IMPRESSION: 1. Minimal marrow signal abnormality noted at the tibial site of amputation. This in conjunction with surrounding cellulitis and edema raises the possibility of early osteomyelitis of the tibial stump. 2. No acute meniscal, cruciate or collateral ligament abnormality. The posterior horn of the medial meniscus is obscured by susceptibility artifacts. Electronically Signed   By: Ashley Royalty M.D.   On: 12/22/2016 19:15   Mr Hip Left Wo Contrast  Result Date: 12/22/2016 CLINICAL DATA:  Two weeks of swelling.  No known trauma. EXAM: MR OF THE LEFT HIP WITHOUT CONTRAST TECHNIQUE: Multiplanar, multisequence MR imaging was performed. No intravenous contrast was administered. COMPARISON:  None. FINDINGS: Bones: No hip fracture, dislocation or avascular necrosis. No periosteal reaction or bone destruction. No aggressive osseous lesion. Normal sacrum and sacroiliac joints. No SI joint widening or erosive changes. Degenerative disc disease with disc height loss at L4-5 and L5-S1. Articular cartilage and labrum Articular cartilage: High-grade partial-thickness cartilage loss of of the left hip. Partial-thickness cartilage loss of the right hip. Labrum: Grossly intact, but evaluation is limited by lack of intraarticular fluid. Joint or bursal effusion Joint effusion: Small right hip joint effusion. Small left hip joint effusion. No SI joint effusion. Bursae:  No bursa formation. Muscles and  tendons Flexors: Mild muscle edema in the iliacus muscle bilaterally. Extensors:  Mild muscle edema in the right vastus lateralis muscle. Abductors: Normal. Adductors: Mild muscle edema in the right adductor magnus muscle. Gluteals: Muscle edema in the left superior gluteus maximus muscle. Hamstrings: Normal. Other findings Miscellaneous: No pelvic free fluid. No fluid collection or hematoma. No inguinal lymphadenopathy. No inguinal hernia. IMPRESSION: 1. No hip fracture, dislocation or avascular necrosis. 2. Moderate osteoarthritis of the left hip. Mild osteoarthritis of the right hip. 3. Mild muscle strain involving the the right vastus lateralis muscle, right adductor magnus muscle and left superior gluteus maximus muscle. Electronically  Signed   By: Kathreen Devoid   On: 12/22/2016 17:39   Dg Knee Complete 4 Views Right  Result Date: 12/28/2016 CLINICAL DATA:  Right knee pain and history of cellulitis and early osteomyelitis. Wound over end of right below-knee amputation. EXAM: RIGHT KNEE - COMPLETE 4+ VIEW COMPARISON:  12/22/2016 FINDINGS: Evidence of patient's below-knee amputation unchanged. Diffuse osteopenia. Tricompartmental osteoarthritic change. No definite evidence of bone destruction over the tibial stump to suggest osteomyelitis. Moderate calcified atherosclerotic plaque over the femoral popliteal arteries. IMPRESSION: No definite evidence of osteomyelitis. Below-knee amputation unchanged. Mild tricompartmental osteoarthritis. Atherosclerotic disease. Electronically Signed   By: Marin Olp M.D.   On: 12/28/2016 18:48   Dg Knee Complete 4 Views Right  Result Date: 12/22/2016 CLINICAL DATA:  Patient complains of 2 weeks of swelling to right stump, denies any trauma and does not wear prosthetic. Also complains of left hip pain with with movement. States that he is suppose to be scheduled for appointment at wound clinic in future. No fever, NAD. Noticeable green circular scab on bottom of right  stump. EXAM: RIGHT KNEE - COMPLETE 4+ VIEW COMPARISON:  None. FINDINGS: Status post below-the-knee amputation. The amputated margins of the remaining tibia and fibula are well-defined. There is no bone resorption to suggest osteomyelitis. No fracture. Bones are demineralized. There is mild medial and lateral femorotibial joint space compartment narrowing. No knee joint effusion. Dense vascular calcifications are noted along the distal superficial femoral artery and popliteal artery. Soft tissue edema is seen overlying the stump. No soft tissue air. A small metallic foreign body projects within the posterior subcutaneous soft tissues at the level of the knee. IMPRESSION: 1. No fracture.  No evidence of osteomyelitis. 2. Soft tissue swelling overlying the amputated distal tibia and fibula. Electronically Signed   By: Lajean Manes M.D.   On: 12/22/2016 13:19   Dg Hip Unilat With Pelvis 2-3 Views Left  Result Date: 12/22/2016 CLINICAL DATA:  Patient complains of 2 weeks of swelling to right stump, denies any trauma and does not wear prosthetic. Also complains of left hip pain with with movement. States that he is suppose to be scheduled for appointment at wound clinic in future. No fever, NAD. Noticeable green circular scab on bottom of right stump. EXAM: DG HIP (WITH OR WITHOUT PELVIS) 2-3V LEFT COMPARISON:  None. FINDINGS: No fracture.  No bone lesion. The hip joint is normally aligned. There is mild concentric hip joint space narrowing. Small marginal osteophytes project from the inferior femoral head. Bones are demineralized. There is dense calcification along the iliac and femoral vessels. IMPRESSION: 1. No fracture or bone lesion. 2. Mild hip joint arthropathic change. Electronically Signed   By: Lajean Manes M.D.   On: 12/22/2016 13:21    Time Spent in minutes  35   Louellen Molder M.D on 12/31/2016 at 1:07 PM  Between 7am to 7pm - Pager - [phone]  After 7pm go to [email or web address] - password  [facility]  [facility] -  Office  [phone]

## 2016-12-31 NOTE — Op Note (Signed)
    NAME: Randy [name]   MRN: [id number] DOB: 1932/07/29    DATE OF OPERATION: 12/31/2016  PREOP DIAGNOSIS: Ischemic pain right BKA  POSTOP DIAGNOSIS: Same  PROCEDURE: Right above-the-knee amputation  SURGEON: Judeth Cornfield. Scot Dock, MD, FACS  ASSIST: Silva Bandy, [facility]  ANESTHESIA: Gen.   EBL: Minimal  INDICATIONS: Randy [name] is a 83 y.o. male who had a right below the knee amputation but developed a wound over the distal aspect and also had ischemic pain. Conversion to an above-the-knee amputation was recommended.  FINDINGS: The muscle appeared adequately perfused.  TECHNIQUE: The patient was taken to the operating room and received a general anesthetic. The right lower extremity was prepped and draped in usual sterile fashion. A fishmouth incision was made above the level of the patella and the incision carried down to the skin, subcutaneous tissue, fascia, and muscle, to the femur which was dissected free circumferentially. The periosteum was elevated and the bone divided proximal to the level of skin division. The femoral artery and vein were individually suture ligated with 2-0 silk ties. The tourniquet was then released. Additional hemostasis was obtained using electrocautery. The edges of the bone were rasped. The wound was irrigated with saline. The fascial layer was then closed interrupted 2-0 Vicryl's. The skin was closed with staples. Sterile dressing was applied. The patient tolerated the procedure well and was transferred to the recovery room in stable condition. All needle and sponge counts were correct.  [name], MD, FACS [facility]  DATE OF DICTATION:   12/31/2016

## 2016-12-31 NOTE — Telephone Encounter (Signed)
-----   Message from Denman George, RN sent at 12/31/2016 12:37 PM EDT ----- Regarding: needs 4 week f/u with Dr. Scot Dock for staple removal   ----- Message ----- From: Angelia Mould, MD Sent: 12/31/2016  11:28 AM To: [facility] Charge Pool Subject: charge                                         PROCEDURE: Right above-the-knee amputation  SURGEON: Judeth Cornfield. Scot Dock, MD, FACS  ASSIST: Silva Bandy, [facility]  He will need a follow up visit in 1 month for staple removal. Thank you. CD

## 2016-12-31 NOTE — Progress Notes (Signed)
  Pt admitted to the unit. Pt is stable, alert and oriented per baseline. Oriented to room, staff, and call bell. Educated to call for any assistance. Bed in lowest position, call bell within reach- will continue to monitor. 

## 2016-12-31 NOTE — Progress Notes (Signed)
mrsa swab and chg bath completed this morning.

## 2016-12-31 NOTE — Telephone Encounter (Signed)
Sched appt 02/05/17 at 9:15. Lm on hm# to inform pt of appt and for pt to confirm appt.

## 2016-12-31 NOTE — Consult Note (Signed)
[facility]   Reason for consult: pain right BKA Requesting: Antonietta Breach PA [place]  HPI:  84 pt complains of pain in right BKA.  He is known to our service and was evaluated for this pain and offered right AKA by Dr Scot Dock about 2 weeks ago.  Pt then changed his mind and is now scheduled for AKA by Dr Scot Dock on 4/12.  He has ESRD and MWF dialysis.  He came to ER tonight complaining of intolerable pain.  ROS: he denies fever or chills.  He denies dyspnea or chest pain  Past Medical History:  Diagnosis Date  . Anemia of chronic disease   . Arthritis   . CAD (coronary artery disease)    Moderate multivessel disease 07/2015 - managed medically  . ESRD on hemodialysis [facility])    M/W/F in [place] - Dr. Florene Glen  . Essential hypertension   . Gout   . History of blood transfusion   . History of myopericarditis 2015   07/2015  . History of pneumonia 2014  . History of stroke   . Hypercholesterolemia   . Nonischemic cardiomyopathy (HCC)    LVEF 35-40%  . Peripheral vascular disease ([place])    Status post right below knee amputation for a nonhealing wound of the right foot 12/2014  . Pneumonia     Past Surgical History:  Procedure Laterality Date  . ABOVE KNEE LEG AMPUTATION Left 10/10/2015  . AMPUTATION Right 01/05/2015   BKA  . AMPUTATION Left 10/10/2015   Procedure: AMPUTATION ABOVE KNEE LEFT;  Surgeon: Angelia Mould, MD;  Location: [place];  Service: Vascular;  Laterality: Left;  . AV FISTULA PLACEMENT  08/24/2012   Procedure: ARTERIOVENOUS (AV) FISTULA CREATION;  Surgeon: Rosetta Posner, MD;  Location: [place];  Service: Vascular;  Laterality: Left;  . BACK SURGERY    . CAPD REMOVAL N/A 03/13/2015   Procedure: CONTINUOUS AMBULATORY PERITONEAL DIALYSIS  (CAPD) CATHETER REMOVAL;  Surgeon: Coralie Keens, MD;  Location: [place];  Service: General;  Laterality: N/A;  . CARDIAC CATHETERIZATION    . CHOLECYSTECTOMY    . COLONOSCOPY     remote past by  Dr. Tamala Julian in [place], [place]   . COLONOSCOPY Left 09/26/2014   Dr. Paulita Fujita: old and semi-fresh blood scattered throughout the colon, several medium-sized diverticula, no obvious focal bleeding site, no blood in TI, several polyps seen in cecum, ascending colon and proximal transverse s/p biopsy (tubular adenomas)  . COLONOSCOPY N/A 03/05/2016   Dr. Oneida Alar: old blood in ileum, few fresh clots ascending colon, frequent large mouth diverticula and old clots in descending/sigmoid. 8 polyps in ascending colon not manipulated  . ESOPHAGOGASTRODUODENOSCOPY N/A 03/05/2016   Dr. Oneida Alar: hiatal hernia, mild gastritis   . GANGLION CYST EXCISION  01/03/2012   Procedure: REMOVAL GANGLION OF WRIST;  Surgeon: Scherry Ran, MD;  Location: [facility];  Service: General;  Laterality: Right;  . GIVENS CAPSULE STUDY N/A 03/07/2016   [place]: dark material in ileum, with fresh blood in right colon   . HEMORRHOID SURGERY  1970's  . INSERTION OF DIALYSIS CATHETER Right     neck  . INSERTION OF DIALYSIS CATHETER  10/19/2012   Procedure: INSERTION OF DIALYSIS CATHETER;  Surgeon: Rosetta Posner, MD;  Location: [place];  Service: Vascular;  Laterality: N/A;  [place] ARTHROSCOPY Right 2007  . LEFT HEART CATHETERIZATION WITH CORONARY ANGIOGRAM N/A 08/17/2014   Procedure: LEFT HEART CATHETERIZATION WITH CORONARY  ANGIOGRAM;  Surgeon: Larey Dresser, MD;  Location: [facility] CATH LAB;  Service: Cardiovascular;  Laterality: N/A;  . LUMBAR [place] SURGERY  2004    No current facility-administered medications on file prior to encounter.    Current Outpatient Prescriptions on File Prior to Encounter  Medication Sig Dispense Refill  . amLODipine (NORVASC) 5 MG tablet Take 5 mg by mouth daily.    Marland Kitchen atorvastatin (LIPITOR) 80 MG tablet Take 1 tablet (80 mg total) by mouth daily. (Patient taking differently: Take 80 mg by mouth daily after supper. ) 90 tablet 3  . brimonidine (ALPHAGAN) 0.2 % ophthalmic solution Place 1  drop into both eyes 2 (two) times daily.    . cefTAZidime 2 g in dextrose 5 % 50 mL Inject 2 g into the vein every Monday, Wednesday, and Friday at 8 PM.    . cinacalcet (SENSIPAR) 60 MG tablet Take 60 mg by mouth daily after supper.     . diclofenac sodium (VOLTAREN) 1 % GEL Apply 1 application topically 3 (three) times daily as needed (pain).   98  . docusate sodium (COLACE) 100 MG capsule Take 2 capsules (200 mg total) by mouth daily. (Patient taking differently: Take 200-300 mg by mouth daily as needed for mild constipation or moderate constipation. ) 10 capsule 0  . feeding supplement (BOOST / RESOURCE BREEZE) LIQD Take 1 Container by mouth 4 (four) times daily -  with meals and at bedtime. (Patient taking differently: Take 1 Container by mouth daily. ) 14 Container 0  . gabapentin (NEURONTIN) 300 MG capsule Take 300 mg by mouth at bedtime.    . hydrALAZINE (APRESOLINE) 50 MG tablet Take 50 mg by mouth 2 (two) times daily as needed (high blood pressure >160/80).     Marland Kitchen HYDROcodone-acetaminophen (NORCO/VICODIN) 5-325 MG tablet Take 1 tablet by mouth every 4 (four) hours as needed (pain). 15 tablet 0  . isosorbide mononitrate (IMDUR) 30 MG 24 hr tablet TAKE 1/2 TABLET BY MOUTH AT BEDTIME. 45 tablet 1  . ketoconazole (NIZORAL) 2 % cream Apply 1 application topically daily as needed for irritation (itching).   0  . lidocaine-prilocaine (EMLA) cream Apply 1 application topically every Monday, Wednesday, and Friday. For dialysis  10  . lisinopril (PRINIVIL,ZESTRIL) 40 MG tablet Take 40 mg by mouth daily.  2  . nitroGLYCERIN (NITROSTAT) 0.4 MG SL tablet Place 1 tablet (0.4 mg total) under the tongue every 5 (five) minutes as needed for chest pain. 30 tablet 12  . oxyCODONE-acetaminophen (PERCOCET/ROXICET) 5-325 MG tablet Take 1 tablet by mouth every 6 (six) hours as needed. 15 tablet 0  . pantoprazole (PROTONIX) 40 MG tablet Take 1 tablet (40 mg total) by mouth daily at 12 noon.    . sevelamer carbonate  (RENVELA) 800 MG tablet Take 1,600-2,400 mg by mouth See admin instructions. Take 3 tablets (2400 mg) with meals and 2 tablets (1600 mg) with snacks    . timolol (BETIMOL) 0.5 % ophthalmic solution Place 1 drop into both eyes 2 (two) times daily.    . Vancomycin (VANCOCIN) 750-5 MG/150ML-% SOLN Inject 150 mLs (750 mg total) into the vein every Monday, Wednesday, and Friday with hemodialysis. 4000 mL    No Known Allergies    Objective 129/71 77 98.4 F (36.9 C) (Oral) 16 97%  Intake/Output Summary (Last 24 hours) at 12/31/16 0200 Last data filed at 12/31/16 0100  Gross per 24 hour  Intake  0 ml  Output                0 ml  Net                0 ml     Assessment/Planning: Pain right BKA no real acute change from prior exam about 2 weeks ago Will keep NPO for now to see if Dr Scot Dock has time for AKA later today Otherwise will keep on schedule for Thursday to avoid conflict with HD schedule    Ruta Hinds 12/31/2016 2:00 AM --  Laboratory Lab Results:  Recent Labs  12/28/16 2000 12/30/16 2130  WBC 6.2 8.0  HGB 11.8* 12.1*  HCT 40.4 40.9  PLT 249 246   BMET  Recent Labs  12/28/16 2000  NA 138  K 4.7  CL 96*  CO2 29  GLUCOSE 93  BUN 41*  CREATININE 6.79*  CALCIUM 9.0    COAG Lab Results  Component Value Date   INR 1.05 12/22/2016   INR 1.12 08/19/2016   INR 1.03 01/05/2015   No results found for: PTT

## 2016-12-31 NOTE — Consult Note (Signed)
[facility] Consultation Note    Indication for Consultation:  Management of ESRD/hemodialysis; anemia, hypertension/volume and secondary hyperparathyroidism PCP: [name], MD  HPI: Randy [name] is a 83 y.o. 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 He was just discharged for the same 12/24/16. He declined revision to AKA at that time but was readmitted for pain and surgery and is now s/p right AKA . He had been discharged on a six  week course of empiric Vanc and Fortaz for osteo that has been given at dialysis since last discharge except did not get the full dose yesterday due to signing off early.Marland Kitchen He is afebrile. WBC wnl. Last HD was yesterday . His net UF was 63.4 (EDW 62). He only ran 2.25 hours. He signed off early due to pain.   At present he has right AKA but not as bad as before. Ate a little lunch. Very sleepy. No N, V, D. Breathing ok without CP.  He is due for dialysis Wednesday.  K today 4.8 pre op - hgb 10.8 pre op   Past Medical History:  Diagnosis Date  . Anemia of chronic disease   . Arthritis   . CAD (coronary artery disease)    Moderate multivessel disease 07/2015 - managed medically  . ESRD on hemodialysis [facility])    M/W/F in [place] - Dr. Florene Glen  . Essential hypertension   . Gout   . History of blood transfusion   . History of myopericarditis 2015   07/2015  . History of pneumonia 2014  . History of stroke   . Hypercholesterolemia   . Nonischemic cardiomyopathy (HCC)    LVEF 35-40%  . Peripheral vascular disease ([place])    Status post right below knee amputation for a nonhealing wound of the right foot 12/2014  . Pneumonia    Past Surgical History:  Procedure Laterality Date  . ABOVE KNEE LEG AMPUTATION Left 10/10/2015  . AMPUTATION Right 01/05/2015   BKA  . AMPUTATION Left 10/10/2015   Procedure: AMPUTATION ABOVE KNEE LEFT;  Surgeon:  Angelia Mould, MD;  Location: [place];  Service: Vascular;  Laterality: Left;  . AV FISTULA PLACEMENT  08/24/2012   Procedure: ARTERIOVENOUS (AV) FISTULA CREATION;  Surgeon: Rosetta Posner, MD;  Location: [place];  Service: Vascular;  Laterality: Left;  . BACK SURGERY    . CAPD REMOVAL N/A 03/13/2015   Procedure: CONTINUOUS AMBULATORY PERITONEAL DIALYSIS  (CAPD) CATHETER REMOVAL;  Surgeon: Coralie Keens, MD;  Location: [place];  Service: General;  Laterality: N/A;  . CARDIAC CATHETERIZATION    . CHOLECYSTECTOMY    . COLONOSCOPY     remote past by Dr. Tamala Julian in [place], [place]   . COLONOSCOPY Left 09/26/2014   Dr. Paulita Fujita: old and semi-fresh blood scattered throughout the colon, several medium-sized diverticula, no obvious focal bleeding site, no blood in TI, several polyps seen in cecum, ascending colon and proximal transverse s/p biopsy (tubular adenomas)  . COLONOSCOPY N/A 03/05/2016   Dr. Oneida Alar: old blood in ileum, few fresh clots ascending colon, frequent large mouth diverticula and old clots in descending/sigmoid. 8 polyps in ascending colon not manipulated  . ESOPHAGOGASTRODUODENOSCOPY N/A 03/05/2016   Dr. Oneida Alar: hiatal hernia, mild gastritis   . GANGLION CYST EXCISION  01/03/2012   Procedure: REMOVAL GANGLION OF WRIST;  Surgeon: Scherry Ran, MD;  Location: [facility];  Service: General;  Laterality:  Right;  Marland Kitchen GIVENS CAPSULE STUDY N/A 03/07/2016   [place]: dark material in ileum, with fresh blood in right colon   . HEMORRHOID SURGERY  1970's  . INSERTION OF DIALYSIS CATHETER Right     neck  . INSERTION OF DIALYSIS CATHETER  10/19/2012   Procedure: INSERTION OF DIALYSIS CATHETER;  Surgeon: Rosetta Posner, MD;  Location: [place];  Service: Vascular;  Laterality: N/A;  [place] ARTHROSCOPY Right 2007  . LEFT HEART CATHETERIZATION WITH CORONARY ANGIOGRAM N/A 08/17/2014   Procedure: LEFT HEART CATHETERIZATION WITH CORONARY ANGIOGRAM;  Surgeon: Larey Dresser, MD;   Location: [facility] CATH LAB;  Service: Cardiovascular;  Laterality: N/A;  . LUMBAR [place] SURGERY  2004   Family History  Problem Relation Age of Onset  . Stroke Mother   . Arthritis    . Cancer    . Kidney disease    . Cancer Sister   . Colon cancer Brother     unclear   . Colon cancer Brother   . Anesthesia problems Neg Hx   . Hypotension Neg Hx   . Malignant hyperthermia Neg Hx   . Pseudochol deficiency Neg Hx    Social History:  reports that he quit smoking about 18 years ago. His smoking use included Cigarettes. He has a 30.00 pack-year smoking history. He quit smokeless tobacco use about 23 years ago. His smokeless tobacco use included Chew. He reports that he does not drink alcohol or use drugs. No Known Allergies Prior to Admission medications   Medication Sig Start Date End Date Taking? Authorizing Provider  amLODipine (NORVASC) 5 MG tablet Take 5 mg by mouth daily.   Yes Historical Provider, MD  atorvastatin (LIPITOR) 80 MG tablet Take 1 tablet (80 mg total) by mouth daily. Patient taking differently: Take 80 mg by mouth daily after supper.  11/02/15  Yes Arnoldo Lenis, MD  brimonidine (ALPHAGAN) 0.2 % ophthalmic solution Place 1 drop into both eyes 2 (two) times daily.   Yes Historical Provider, MD  cefTAZidime 2 g in dextrose 5 % 50 mL Inject 2 g into the vein every Monday, Wednesday, and Friday at 8 PM. 12/25/16  Yes Geradine Girt, DO  cinacalcet (SENSIPAR) 60 MG tablet Take 60 mg by mouth daily after supper.    Yes Historical Provider, MD  diclofenac sodium (VOLTAREN) 1 % GEL Apply 1 application topically 3 (three) times daily as needed (pain).  12/12/16  Yes Historical Provider, MD  docusate sodium (COLACE) 100 MG capsule Take 2 capsules (200 mg total) by mouth daily. Patient taking differently: Take 200-300 mg by mouth daily as needed for mild constipation or moderate constipation.  10/31/15  Yes Ivan Anchors Love, PA-C  feeding supplement (BOOST / RESOURCE BREEZE) LIQD Take 1  Container by mouth 4 (four) times daily -  with meals and at bedtime. Patient taking differently: Take 1 Container by mouth daily.  03/14/16  Yes Lavina Hamman, MD  gabapentin (NEURONTIN) 300 MG capsule Take 300 mg by mouth at bedtime.   Yes Historical Provider, MD  hydrALAZINE (APRESOLINE) 50 MG tablet Take 50 mg by mouth 2 (two) times daily as needed (high blood pressure >160/80).    Yes Historical Provider, MD  HYDROcodone-acetaminophen (NORCO/VICODIN) 5-325 MG tablet Take 1 tablet by mouth every 4 (four) hours as needed (pain). 12/24/16  Yes Geradine Girt, DO  isosorbide mononitrate (IMDUR) 30 MG 24 hr tablet TAKE 1/2 TABLET BY MOUTH AT BEDTIME. 07/10/16  Yes Arnoldo Lenis, MD  ketoconazole (NIZORAL) 2 % cream Apply 1 application topically daily as needed for irritation (itching).  11/07/16  Yes Historical Provider, MD  lidocaine-prilocaine (EMLA) cream Apply 1 application topically every Monday, Wednesday, and Friday. For dialysis 10/06/15  Yes Historical Provider, MD  lisinopril (PRINIVIL,ZESTRIL) 40 MG tablet Take 40 mg by mouth daily. 12/03/16  Yes Historical Provider, MD  nitroGLYCERIN (NITROSTAT) 0.4 MG SL tablet Place 1 tablet (0.4 mg total) under the tongue every 5 (five) minutes as needed for chest pain. 12/20/15  Yes Ripudeep Krystal Eaton, MD  oxyCODONE-acetaminophen (PERCOCET/ROXICET) 5-325 MG tablet Take 1 tablet by mouth every 6 (six) hours as needed. 12/28/16  Yes Lily Kocher, PA-C  pantoprazole (PROTONIX) 40 MG tablet Take 1 tablet (40 mg total) by mouth daily at 12 noon. 10/23/15  Yes Barton Dubois, MD  sevelamer carbonate (RENVELA) 800 MG tablet Take 1,600-2,400 mg by mouth See admin instructions. Take 3 tablets (2400 mg) with meals and 2 tablets (1600 mg) with snacks   Yes Historical Provider, MD  timolol (BETIMOL) 0.5 % ophthalmic solution Place 1 drop into both eyes 2 (two) times daily.   Yes Historical Provider, MD  Vancomycin (VANCOCIN) 750-5 MG/150ML-% SOLN Inject 150 mLs (750 mg  total) into the vein every Monday, Wednesday, and Friday with hemodialysis. 12/25/16  Yes Geradine Girt, DO   Current Facility-Administered Medications  Medication Dose Route Frequency Provider Last Rate Last Dose  . acetaminophen (TYLENOL) tablet 325-650 mg  325-650 mg Oral Q4H PRN Alvia Grove, PA-C       Or  . acetaminophen (TYLENOL) suppository 325-650 mg  325-650 mg Rectal Q4H PRN Alvia Grove, PA-C      . acetaminophen (TYLENOL) tablet 1,000 mg  1,000 mg Oral TID [name], MD      . alum & mag hydroxide-simeth (MAALOX/MYLANTA) 200-200-20 MG/5ML suspension 15-30 mL  15-30 mL Oral Q2H PRN Alvia Grove, PA-C      . amLODipine (NORVASC) tablet 5 mg  5 mg Oral Daily [name], MD      . atorvastatin (LIPITOR) tablet 80 mg  80 mg Oral QPC supper [name], MD      . brimonidine (ALPHAGAN) 0.2 % ophthalmic solution 1 drop  1 drop Both Eyes BID [name], MD      . Derrill Memo ON 01/01/2017] cefTAZidime (FORTAZ) 2 g in dextrose 5 % 50 mL IVPB  2 g Intravenous Q M,W,F-2000 [name], MD      . cinacalcet (SENSIPAR) tablet 60 mg  60 mg Oral QPC supper [name], MD      . Derrill Memo ON 01/01/2017] docusate sodium (COLACE) capsule 100 mg  100 mg Oral Daily Joelene Millin A Trinh, PA-C      . feeding supplement (BOOST / RESOURCE BREEZE) liquid 1 Container  1 Container Oral Daily [name], MD      . gabapentin (NEURONTIN) capsule 300 mg  300 mg Oral QHS [name], MD   300 mg at 12/31/16 0032  . guaiFENesin-dextromethorphan (ROBITUSSIN DM) 100-10 MG/5ML syrup 15 mL  15 mL Oral Q4H PRN Alvia Grove, PA-C      . heparin injection 5,000 Units  5,000 Units Subcutaneous Q8H [name], MD      . hydrALAZINE (APRESOLINE) tablet 50 mg  50 mg Oral BID PRN [name], MD      . HYDROmorphone (DILAUDID) injection 1-1.5 mg  1-1.5  mg Intravenous Q3H PRN Antonietta Breach, PA-C   1 mg at 12/31/16 0229  .  isosorbide mononitrate (IMDUR) 24 hr tablet 15 mg  15 mg Oral QHS [name], MD   15 mg at 12/31/16 0032  . labetalol (NORMODYNE,TRANDATE) injection 10 mg  10 mg Intravenous Q10 min PRN Alvia Grove, PA-C      . lisinopril (PRINIVIL,ZESTRIL) tablet 40 mg  40 mg Oral Daily [name], MD      . magnesium sulfate IVPB 2 g 50 mL  2 g Intravenous Daily PRN Alvia Grove, PA-C      . metoprolol (LOPRESSOR) injection 2-5 mg  2-5 mg Intravenous Q2H PRN Alvia Grove, PA-C      . ondansetron (ZOFRAN) tablet 4 mg  4 mg Oral Q6H PRN [name], MD       Or  . ondansetron (ZOFRAN) injection 4 mg  4 mg Intravenous Q6H PRN [name], MD      . oxyCODONE-acetaminophen (PERCOCET/ROXICET) 5-325 MG per tablet 1-2 tablet  1-2 tablet Oral Q4H PRN Alvia Grove, PA-C   2 tablet at 12/31/16 1244  . pantoprazole (PROTONIX) EC tablet 40 mg  40 mg Oral Q1200 [name], MD   40 mg at 12/31/16 1244  . phenol (CHLORASEPTIC) mouth spray 1 spray  1 spray Mouth/Throat PRN Alvia Grove, PA-C      . polyethylene glycol (MIRALAX / GLYCOLAX) packet 17 g  17 g Oral Daily PRN [name], MD      . potassium chloride SA (K-DUR,KLOR-CON) CR tablet 20-40 mEq  20-40 mEq Oral Daily PRN Alvia Grove, PA-C      . sevelamer carbonate (RENVELA) tablet 1,600 mg  1,600 mg Oral PRN [name], MD      . sevelamer carbonate (RENVELA) tablet 2,400 mg  2,400 mg Oral TID WC [name], MD   2,400 mg at 12/31/16 1245  . timolol (TIMOPTIC) 0.5 % ophthalmic solution 1 drop  1 drop Both Eyes BID [name], MD      . Derrill Memo ON 01/01/2017] vancomycin (VANCOCIN) IVPB 750 mg/150 ml premix  750 mg Intravenous Q M,W,F-HD [name], MD       Labs: Basic Metabolic Panel:  Recent Labs Lab 12/28/16 2000 12/31/16 0700  NA 138 137  K 4.7 4.8  CL 96* 98*  CO2 29 26  GLUCOSE 93 88  BUN 41* 41*  CREATININE 6.79* 7.54*   CALCIUM 9.0 9.0   Liver Function Tests:  Recent Labs Lab 12/31/16 0700  AST 13*  ALT 13*  ALKPHOS 66  BILITOT 0.5  PROT 6.2*  ALBUMIN 2.7*   CBC:  Recent Labs Lab 12/28/16 2000 12/30/16 2130 12/31/16 0700  WBC 6.2 8.0 7.7  NEUTROABS 4.7  --   --   HGB 11.8* 12.1* 10.8*  HCT 40.4 40.9 37.3*  MCV 99.3 97.8 97.6  PLT 249 246 256   ROS: As per HPI otherwise negative.  Physical Exam: Vitals:   12/31/16 1115 12/31/16 1130 12/31/16 1145 12/31/16 1228  BP: 134/68 138/63 140/68 125/76  Pulse: 80 72 80 83  Resp: 10 17 15    Temp: 97.9 F (36.6 C)  97.9 F (36.6 C) 98.1 F (36.7 C)  TempSrc:    Oral  SpO2: 100% 100% 96% 96%  Weight:      Height:         General: pleasant elderly AAM NAD Head:  NCAT sclera not icteric MMM Neck: Supple.  Lungs: CTA bilaterally without wheezes, rales, or rhonchi anteriorly  Breathing is unlabored on room air Heart: RRR with S1 S2.  Abdomen: soft NT + BS Lower extremities:  Right AKA wrapped; left AKA no sig edema Neuro: drowsy but rouses easily Psych:  Responds to questions appropriately with a normal affect. Dialysis Access:Left lower AVF + bruit  Dialysis Orders:  MWF [facility] -3.5 hr EDW 62 400/800 2 K 2 Ca profile 4 left lower AVF no heparin s/p  Fe Mircera 200 4/4 -lowered to 150 for next dose calcitriol 3 gains 2-3 L  Recent labs:  hgb 11.4/4 26% sat Ca corr 8.9 P 5.9 iPTH 347 On Vanc 750mg  and Fortaz 2gm IV q HD since d/c  Assessment/Plan: 1. s/p right AKA secondary to osteo -not sure that IV abtx are warranted since he is post surgery; pain is better 2. ESRD -  MWF - HD Wed no heparin K 4.8 3. Hypertension/volume  - continue outpt meds/UF volume - determine new EDW 4. Anemia  - hgb 10.8 - pre op - repeat CBC in am- ESA due for redose next week - not ordered yet 5. Metabolic bone disease -  Continue calctiriol/sensipar/binders 6. Nutrition - renal diet/vits/nepro 7. PAD/CAD/afib - per primary -no anticoag due to prior  [employer], PA-C [place] [phone] 12/31/2016, 2:13 PM  I have seen and examined this patient and agree with plan per Amalia Hailey.  83yo BM on HD MWF in [place] admitted to convert Rt BKA to AKA. Last HD was yest.  Labs reviewed and OK.  He is awake and alert and wants to eat.  Will plan HD tomorrow. DC IV fluids.  ? If he will be able to get along at home?? Markale Birdsell T,MD 12/31/2016 4:03 PM

## 2016-12-31 NOTE — H&P (View-Only) (Signed)
[facility]   Reason for consult: pain right BKA Requesting: Antonietta Breach PA [place]  HPI:  25 pt complains of pain in right BKA.  He is known to our service and was evaluated for this pain and offered right AKA by Dr Scot Dock about 2 weeks ago.  Pt then changed his mind and is now scheduled for AKA by Dr Scot Dock on 4/12.  He has ESRD and MWF dialysis.  He came to ER tonight complaining of intolerable pain.  ROS: he denies fever or chills.  He denies dyspnea or chest pain  Past Medical History:  Diagnosis Date  . Anemia of chronic disease   . Arthritis   . CAD (coronary artery disease)    Moderate multivessel disease 07/2015 - managed medically  . ESRD on hemodialysis [facility])    M/W/F in [place] - Dr. Florene Glen  . Essential hypertension   . Gout   . History of blood transfusion   . History of myopericarditis 2015   07/2015  . History of pneumonia 2014  . History of stroke   . Hypercholesterolemia   . Nonischemic cardiomyopathy (HCC)    LVEF 35-40%  . Peripheral vascular disease ([place])    Status post right below knee amputation for a nonhealing wound of the right foot 12/2014  . Pneumonia     Past Surgical History:  Procedure Laterality Date  . ABOVE KNEE LEG AMPUTATION Left 10/10/2015  . AMPUTATION Right 01/05/2015   BKA  . AMPUTATION Left 10/10/2015   Procedure: AMPUTATION ABOVE KNEE LEFT;  Surgeon: Angelia Mould, MD;  Location: [place];  Service: Vascular;  Laterality: Left;  . AV FISTULA PLACEMENT  08/24/2012   Procedure: ARTERIOVENOUS (AV) FISTULA CREATION;  Surgeon: Rosetta Posner, MD;  Location: [place];  Service: Vascular;  Laterality: Left;  . BACK SURGERY    . CAPD REMOVAL N/A 03/13/2015   Procedure: CONTINUOUS AMBULATORY PERITONEAL DIALYSIS  (CAPD) CATHETER REMOVAL;  Surgeon: Coralie Keens, MD;  Location: [place];  Service: General;  Laterality: N/A;  . CARDIAC CATHETERIZATION    . CHOLECYSTECTOMY    . COLONOSCOPY     remote past by  Dr. Tamala Julian in [place], [place]   . COLONOSCOPY Left 09/26/2014   Dr. Paulita Fujita: old and semi-fresh blood scattered throughout the colon, several medium-sized diverticula, no obvious focal bleeding site, no blood in TI, several polyps seen in cecum, ascending colon and proximal transverse s/p biopsy (tubular adenomas)  . COLONOSCOPY N/A 03/05/2016   Dr. Oneida Alar: old blood in ileum, few fresh clots ascending colon, frequent large mouth diverticula and old clots in descending/sigmoid. 8 polyps in ascending colon not manipulated  . ESOPHAGOGASTRODUODENOSCOPY N/A 03/05/2016   Dr. Oneida Alar: hiatal hernia, mild gastritis   . GANGLION CYST EXCISION  01/03/2012   Procedure: REMOVAL GANGLION OF WRIST;  Surgeon: Scherry Ran, MD;  Location: [facility];  Service: General;  Laterality: Right;  . GIVENS CAPSULE STUDY N/A 03/07/2016   [place]: dark material in ileum, with fresh blood in right colon   . HEMORRHOID SURGERY  1970's  . INSERTION OF DIALYSIS CATHETER Right     neck  . INSERTION OF DIALYSIS CATHETER  10/19/2012   Procedure: INSERTION OF DIALYSIS CATHETER;  Surgeon: Rosetta Posner, MD;  Location: [place];  Service: Vascular;  Laterality: N/A;  [place] ARTHROSCOPY Right 2007  . LEFT HEART CATHETERIZATION WITH CORONARY ANGIOGRAM N/A 08/17/2014   Procedure: LEFT HEART CATHETERIZATION WITH CORONARY  ANGIOGRAM;  Surgeon: Larey Dresser, MD;  Location[facility] C[facility] LAB;  Service: Cardiovascular;  Laterality: N/A;  . LUMBAR [place] SURGERY  2004    No current facility-administered medications on file prior to encounter.    Current Outpatient Prescriptions on File Prior to Encounter  Medication Sig Dispense Refill  . amLODipine (NORVASC) 5 MG tablet Take 5 mg by mouth daily.    Marland Kitchen atorvastatin (LIPITOR) 80 MG tablet Take 1 tablet (80 mg total) by mouth daily. (Patient taking differently: Take 80 mg by mouth daily after supper. ) 90 tablet 3  . brimonidine (ALPHAGAN) 0.2 % ophthalmic solution Place 1  drop into both eyes 2 (two) times daily.    . cefTAZidime 2 g in dextrose 5 % 50 mL Inject 2 g into the vein every Monday, Wednesday, and Friday at 8 PM.    . cinacalcet (SENSIPAR) 60 MG tablet Take 60 mg by mouth daily after supper.     . diclofenac sodium (VOLTAREN) 1 % GEL Apply 1 application topically 3 (three) times daily as needed (pain).   98  . docusate sodium (COLACE) 100 MG capsule Take 2 capsules (200 mg total) by mouth daily. (Patient taking differently: Take 200-300 mg by mouth daily as needed for mild constipation or moderate constipation. ) 10 capsule 0  . feeding supplement (BOOST / RESOURCE BREEZE) LIQD Take 1 Container by mouth 4 (four) times daily -  with meals and at bedtime. (Patient taking differently: Take 1 Container by mouth daily. ) 14 Container 0  . gabapentin (NEURONTIN) 300 MG capsule Take 300 mg by mouth at bedtime.    . hydrALAZINE (APRESOLINE) 50 MG tablet Take 50 mg by mouth 2 (two) times daily as needed (high blood pressure >160/80).     Marland Kitchen HYDROcodone-acetaminophen (NORCO/VICODIN) 5-325 MG tablet Take 1 tablet by mouth every 4 (four) hours as needed (pain). 15 tablet 0  . isosorbide mononitrate (IMDUR) 30 MG 24 hr tablet TAKE 1/2 TABLET BY MOUTH AT BEDTIME. 45 tablet 1  . ketoconazole (NIZORAL) 2 % cream Apply 1 application topically daily as needed for irritation (itching).   0  . lidocaine-prilocaine (EMLA) cream Apply 1 application topically every Monday, Wednesday, and Friday. For dialysis  10  . lisinopril (PRINIVIL,ZESTRIL) 40 MG tablet Take 40 mg by mouth daily.  2  . nitroGLYCERIN (NITROSTAT) 0.4 MG SL tablet Place 1 tablet (0.4 mg total) under the tongue every 5 (five) minutes as needed for chest pain. 30 tablet 12  . oxyCODONE-acetaminophen (PERCOCET/ROXICET) 5-325 MG tablet Take 1 tablet by mouth every 6 (six) hours as needed. 15 tablet 0  . pantoprazole (PROTONIX) 40 MG tablet Take 1 tablet (40 mg total) by mouth daily at 12 noon.    . sevelamer carbonate  (RENVELA) 800 MG tablet Take 1,600-2,400 mg by mouth See admin instructions. Take 3 tablets (2400 mg) with meals and 2 tablets (1600 mg) with snacks    . timolol (BETIMOL) 0.5 % ophthalmic solution Place 1 drop into both eyes 2 (two) times daily.    . Vancomycin (VANCOCIN) 750-5 MG/150ML-% SOLN Inject 150 mLs (750 mg total) into the vein every Monday, Wednesday, and Friday with hemodialysis. 4000 mL    No Known Allergies    Objective 129/71 77 98.4 F (36.9 C) (Oral) 16 97%  Intake/Output Summary (Last 24 hours) at 12/31/16 0200 Last data filed at 12/31/16 0100  Gross per 24 hour  Intake  0 ml  Output                0 ml  Net                0 ml     Assessment/Planning: Pain right BKA no real acute change from prior exam about 2 weeks ago Will keep NPO for now to see if Dr Scot Dock has time for AKA later today Otherwise will keep on schedule for Thursday to avoid conflict with HD schedule    Ruta Hinds 12/31/2016 2:00 AM --  Laboratory Lab Results:  Recent Labs  12/28/16 2000 12/30/16 2130  WBC 6.2 8.0  HGB 11.8* 12.1*  HCT 40.4 40.9  PLT 249 246   BMET  Recent Labs  12/28/16 2000  NA 138  K 4.7  CL 96*  CO2 29  GLUCOSE 93  BUN 41*  CREATININE 6.79*  CALCIUM 9.0    COAG Lab Results  Component Value Date   INR 1.05 12/22/2016   INR 1.12 08/19/2016   INR 1.03 01/05/2015   No results found for: PTT

## 2016-12-31 NOTE — Anesthesia Postprocedure Evaluation (Signed)
Anesthesia Post Note  Patient: Randy [name]  Procedure(s) Performed: Procedure(s) (LRB): AMPUTATION ABOVE KNEE (Right)  Patient location during evaluation: PACU Anesthesia Type: General Level of consciousness: awake Pain management: pain level not controlled Respiratory status: spontaneous breathing Cardiovascular status: stable Anesthetic complications: no       Last Vitals:  Vitals:   12/31/16 1145 12/31/16 1228  BP: 140/68 125/76  Pulse: 80 83  Resp: 15   Temp: 36.6 C 36.7 C    Last Pain:  Vitals:   12/31/16 1228  TempSrc: Oral  PainSc:                  Lateesha Bezold

## 2016-12-31 NOTE — Progress Notes (Signed)
Patient states that no one here at this hospital spoke to him concerning his surgery but he would like to sign the consent because he understands what the surgery will be about since he has had it before. He also stated what surgery he was having and the reason. Consent signed.

## 2016-12-31 NOTE — Transfer of Care (Signed)
Immediate Anesthesia Transfer of Care Note  Patient: HELIOS KOHLMANN  Procedure(s) Performed: Procedure(s): AMPUTATION ABOVE KNEE (Right)  Patient Location: PACU  Anesthesia Type:General  Level of Consciousness: awake  Airway & Oxygen Therapy: Patient Spontanous Breathing and Patient connected to face mask oxygen  Post-op Assessment: Report given to RN and Post -op Vital signs reviewed and stable  Post vital signs: Reviewed and stable  Last Vitals:  Vitals:   12/31/16 0451 12/31/16 1115  BP: 140/81 134/68  Pulse: 86 80  Resp: 12 10  Temp: 36.8 C 36.6 C    Last Pain:  Vitals:   12/31/16 0451  TempSrc: Oral  PainSc:       Patients Stated Pain Goal: 1 (11/57/26 2035)  Complications: No apparent anesthesia complications

## 2016-12-31 NOTE — Anesthesia Preprocedure Evaluation (Signed)
Anesthesia Evaluation  Patient identified by MRN, date of birth, ID band Patient awake    Reviewed: Allergy & Precautions, NPO status , Patient's Chart, lab work & pertinent test results  Airway Mallampati: II     Mouth opening: Limited Mouth Opening  Dental   Pulmonary pneumonia, COPD, former smoker,    breath sounds clear to auscultation       Cardiovascular hypertension, + angina + CAD, + Past MI, + Peripheral Vascular Disease and +CHF   Rhythm:Regular Rate:Normal     Neuro/Psych    GI/Hepatic negative GI ROS, Neg liver ROS,   Endo/Other    Renal/GU Renal disease     Musculoskeletal  (+) Arthritis ,   Abdominal   Peds  Hematology  (+) anemia ,   Anesthesia Other Findings   Reproductive/Obstetrics                             Anesthesia Physical Anesthesia Plan  ASA: III  Anesthesia Plan: General   Post-op Pain Management:    Induction: Intravenous  Airway Management Planned: Oral ETT  Additional Equipment:   Intra-op Plan:   Post-operative Plan: Extubation in OR  Informed Consent: I have reviewed the patients History and Physical, chart, labs and discussed the procedure including the risks, benefits and alternatives for the proposed anesthesia with the patient or authorized representative who has indicated his/her understanding and acceptance.   Dental advisory given  Plan Discussed with: CRNA and Surgeon  Anesthesia Plan Comments:         Anesthesia Quick Evaluation

## 2016-12-31 NOTE — Anesthesia Procedure Notes (Signed)
Procedure Name: LMA Insertion Date/Time: 12/31/2016 10:15 AM Performed by: Rejeana Brock L Pre-anesthesia Checklist: Patient identified, Emergency Drugs available, Suction available and Patient being monitored Patient Re-evaluated:Patient Re-evaluated prior to inductionOxygen Delivery Method: Circle System Utilized Preoxygenation: Pre-oxygenation with 100% oxygen Intubation Type: IV induction Ventilation: Mask ventilation without difficulty LMA: LMA inserted LMA Size: 5.0 Number of attempts: 1 Airway Equipment and Method: Bite block Placement Confirmation: positive ETCO2 Tube secured with: Tape Dental Injury: Teeth and Oropharynx as per pre-operative assessment

## 2017-01-01 ENCOUNTER — Encounter (HOSPITAL_COMMUNITY): Payer: Self-pay | Admitting: Vascular Surgery

## 2017-01-01 ENCOUNTER — Ambulatory Visit: Payer: Medicare Other | Admitting: Vascular Surgery

## 2017-01-01 DIAGNOSIS — Z89612 Acquired absence of left leg above knee: Secondary | ICD-10-CM

## 2017-01-01 DIAGNOSIS — Z89611 Acquired absence of right leg above knee: Secondary | ICD-10-CM

## 2017-01-01 DIAGNOSIS — I480 Paroxysmal atrial fibrillation: Secondary | ICD-10-CM

## 2017-01-01 LAB — CBC
HEMATOCRIT: 35.7 % — AB (ref 39.0–52.0)
HEMOGLOBIN: 10.5 g/dL — AB (ref 13.0–17.0)
MCH: 28.6 pg (ref 26.0–34.0)
MCHC: 29.4 g/dL — AB (ref 30.0–36.0)
MCV: 97.3 fL (ref 78.0–100.0)
Platelets: 255 10*3/uL (ref 150–400)
RBC: 3.67 MIL/uL — ABNORMAL LOW (ref 4.22–5.81)
RDW: 19.8 % — ABNORMAL HIGH (ref 11.5–15.5)
WBC: 8.1 10*3/uL (ref 4.0–10.5)

## 2017-01-01 LAB — BASIC METABOLIC PANEL
ANION GAP: 16 — AB (ref 5–15)
BUN: 58 mg/dL — ABNORMAL HIGH (ref 6–20)
CHLORIDE: 92 mmol/L — AB (ref 101–111)
CO2: 29 mmol/L (ref 22–32)
CREATININE: 9.04 mg/dL — AB (ref 0.61–1.24)
Calcium: 8.5 mg/dL — ABNORMAL LOW (ref 8.9–10.3)
GFR calc non Af Amer: 5 mL/min — ABNORMAL LOW (ref >60)
GFR, EST AFRICAN AMERICAN: 5 mL/min — AB (ref >60)
Glucose, Bld: 111 mg/dL — ABNORMAL HIGH (ref 65–99)
POTASSIUM: 5.1 mmol/L (ref 3.5–5.1)
SODIUM: 137 mmol/L (ref 135–145)

## 2017-01-01 LAB — GLUCOSE, CAPILLARY
GLUCOSE-CAPILLARY: 128 mg/dL — AB (ref 65–99)
Glucose-Capillary: 151 mg/dL — ABNORMAL HIGH (ref 65–99)

## 2017-01-01 MED ORDER — VANCOMYCIN HCL IN DEXTROSE 750-5 MG/150ML-% IV SOLN
INTRAVENOUS | Status: AC
  Administered 2017-01-01: 750 mg via INTRAVENOUS
  Filled 2017-01-01: qty 150

## 2017-01-01 MED ORDER — HEPARIN SODIUM (PORCINE) 1000 UNIT/ML DIALYSIS
1000.0000 [IU] | INTRAMUSCULAR | Status: DC | PRN
  Filled 2017-01-01: qty 1

## 2017-01-01 MED ORDER — HYDROMORPHONE HCL 1 MG/ML IJ SOLN
INTRAMUSCULAR | Status: AC
  Administered 2017-01-01: 1 mg
  Filled 2017-01-01: qty 1

## 2017-01-01 MED ORDER — SODIUM CHLORIDE 0.9 % IV SOLN: 100.0000 mL | INTRAVENOUS | Status: DC | PRN

## 2017-01-01 MED ORDER — LIDOCAINE HCL (PF) 1 % IJ SOLN: 5.0000 mL | INTRAMUSCULAR | Status: DC | PRN

## 2017-01-01 MED ORDER — ALTEPLASE 2 MG IJ SOLR
2.0000 mg | Freq: Once | INTRAMUSCULAR | Status: DC | PRN
Start: 2017-01-01 — End: 2017-01-04

## 2017-01-01 MED ORDER — PENTAFLUOROPROP-TETRAFLUOROETH EX AERO: 1.0000 "application " | INHALATION_SPRAY | CUTANEOUS | Status: DC | PRN

## 2017-01-01 MED ORDER — LIDOCAINE-PRILOCAINE 2.5-2.5 % EX CREA
1.0000 | TOPICAL_CREAM | CUTANEOUS | Status: DC | PRN
Start: 2017-01-01 — End: 2017-01-04

## 2017-01-01 MED ORDER — CALCITRIOL 0.5 MCG PO CAPS
ORAL_CAPSULE | ORAL | Status: AC
  Filled 2017-01-01: qty 6

## 2017-01-01 NOTE — Progress Notes (Signed)
I await PT and OT evaluations to assist with determining rehab venue options and needs. [phone]

## 2017-01-01 NOTE — Progress Notes (Signed)
OT Cancellation Note  Patient Details Name: Randy [name] MRN: [id number] DOB: 1931-12-06   Cancelled Treatment:    Reason Eval/Treat Not Completed: Patient at procedure or test/ unavailable (HD)  Parke Poisson B 01/01/2017, 3:32 PM

## 2017-01-01 NOTE — Progress Notes (Signed)
[facility]                                                                              Patient Demographics  Randy [name], is a 83 y.o. male, DOB - 03-31-1932, XLK:[id number]  Admit date - 12/30/2016   Admitting Physician [name], MD  Outpatient Primary MD for the patient is [name], MD  Outpatient specialists:   LOS - 1  days    Chief Complaint  Patient presents with  . Leg Pain       Brief summary   83 y.o.malewith a past medical history significant for ESRD on HD MWF, CAD, HTN, NICM, PVD s/p L AKA and right BKA, and Afib not on AC due to hx GIBwho presents with leg pain. 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  For past few weeks he developed new pain in his right BKA stump with new swelling. He was admitted here for 2 days about a week ago, MRI showed probably early osteomyelitis and cellulitis. It was discussed with ID on the  phone who recommended IV vancomycin and Fortaz, which he has been getting with HD. He was seen during that hospitalization by Dr. Donnetta Hutching who noted patient had been seen for similar symptoms back in Nov 2017 and recommended  possible AKA which was planned for near future. But since pain was unbearable, he came to the ED.  Patient was admitted to hospitalist service. He underwent right AKA on 4/10   Assessment & Plan   Principal Problem:  Right BKA stump with ischemic pain, Osteomyelitis? - Vascular surgery was consulted, patient underwent right AKA on 4/10. -  patient was placed on vancomycin and Fortaz empirically for osteomyelitis, Will likely not need any antibiotics at discharge - Continue  Post op care. Prn IV dialaudid q3h for pain.  - Vascular surgery recommending inpatient rehabilitation   Active Problems:   End stage renal disease,on HD ( M,W,F) - Sees Dr Florene Glen.  -continue  sensipar and renvela -Nephrology consulted, hemodialysis per his schedule     Anemia of chronic disease - Hemoglobin currently stable     Chronic combined systolic and diastolic CHF (congestive heart failure) (HCC) euvolemic. volume management with HD Continue lisinopril, imdur and hydralazine. Continue statin  Essential HTN  on amlodipine, ACEi, imdur and hydralazine    AF (paroxysmal atrial fibrillation) (HCC) Not on anticoagulation due to history of GI bleed.  Code Status: Full code DVT Prophylaxis:  Lovenox  Family Communication: Discussed in detail with the patient, all imaging results, lab results explained to the patient   Disposition Plan: Possibly tomorrow inpatient rehabilitation if bed available  Time Spent in minutes  25 minutes  Procedures:  Right AKA   Consultants:   Vascular surgery Nephrology   Antimicrobials  Vancomycin    Tressie Ellis    Medications  Scheduled Meds: . acetaminophen  1,000 mg Oral TID  . amLODipine  5 mg Oral Daily  . atorvastatin  80 mg Oral QPC supper  . brimonidine  1 drop Both Eyes BID  . calcitRIOL  3 mcg Oral Q M,W,F-HD  . ceftAZIDime (FORTAZ) IVPB 2 gram/50 mL D5W (Pyxis)  2 g Intravenous Q M,W,F-2000  . cinacalcet  60 mg Oral QPC supper  . docusate sodium  100 mg Oral Daily  . feeding supplement  1 Container Oral Daily  . feeding supplement (NEPRO CARB STEADY)  237 mL Oral BID BM  . gabapentin  300 mg Oral QHS  . heparin  5,000 Units Subcutaneous Q8H  . isosorbide mononitrate  15 mg Oral QHS  . lisinopril  40 mg Oral Daily  . multivitamin  1 tablet Oral QHS  . pantoprazole  40 mg Oral Q1200  . sevelamer carbonate  2,400 mg Oral TID WC  . timolol  1 drop Both Eyes BID  . Vancomycin  750 mg Intravenous Q M,W,F-HD   Continuous Infusions: PRN Meds:.sodium chloride, sodium chloride, acetaminophen **OR** acetaminophen, alteplase, guaiFENesin-dextromethorphan, heparin, hydrALAZINE, HYDROmorphone (DILAUDID) injection,  labetalol, lidocaine (PF), lidocaine-prilocaine, metoprolol, ondansetron **OR** ondansetron (ZOFRAN) IV, oxyCODONE-acetaminophen, pentafluoroprop-tetrafluoroeth, phenol, polyethylene glycol, sevelamer carbonate   Antibiotics   Anti-infectives    Start     Dose/Rate Route Frequency Ordered Stop   01/01/17 2000  cefTAZidime (FORTAZ) 2 g in dextrose 5 % 50 mL IVPB     2 g 100 mL/hr over 30 Minutes Intravenous Every M-W-F (2000) 12/31/16 0020     01/01/17 1200  vancomycin (VANCOCIN) IVPB 750 mg/150 ml premix     750 mg 150 mL/hr over 60 Minutes Intravenous Every M-W-F (Hemodialysis) 12/31/16 0020     12/31/16 2200  cefUROXime (ZINACEF) 1.5 g in dextrose 5 % 50 mL IVPB  Status:  Discontinued     1.5 g 100 mL/hr over 30 Minutes Intravenous Every 12 hours 12/31/16 1201 12/31/16 1229   12/31/16 0903  cefUROXime (ZINACEF) 1.5 g in dextrose 5 % 50 mL IVPB     1.5 g 100 mL/hr over 30 Minutes Intravenous 30 min pre-op 12/31/16 0903 12/31/16 1032   12/31/16 0902  dextrose 5 % with cefUROXime (ZINACEF) ADS Med    Comments:  [name]   : cabinet override      12/31/16 0902 12/31/16 1017        [name] Pain currently controlled at the time of my encounter, 3 out of 10. Patient denies dizziness, chest pain, shortness of breath, abdominal pain, N/V/D/C, new weakness, numbess, tingling. No acute events overnight.    Objective:   Vitals:   01/01/17 0524 01/01/17 1205 01/01/17 1210 01/01/17 1230  BP: 118/67 110/60 96/60 (!) 88/55  Pulse: 75 76 76 72  Resp: 16 16 16 17   Temp: 99.9 F (37.7 C) 98.9 F (37.2 C) 98.1 F (36.7 C)   TempSrc: Oral Oral    SpO2: 92% 94%    Weight:  66.7 kg (147 lb 0.8 oz)    Height:        Intake/Output Summary (Last 24 hours) at 01/01/17 1305 Last data filed at 12/31/16 2300  Gross per 24 hour  Intake              960 ml  Output              150 ml  Net              810 ml     Wt Readings from Last 3  Encounters:  01/01/17 66.7 kg (147 lb 0.8 oz)  12/28/16  65.8 kg (145 lb)  12/24/16 66.1 kg (145 lb 11.6 oz)     Exam  General: Alert and oriented x 3, NAD  HEENT:   Neck: Supple, no JVD  Cardiovascular: S1 Clear, RRR  Respiratory : Clear to auscultation bilaterally, no wheezing, rales or rhonchi  Gastrointestinal: Soft, nontender, nondistended, + bowel sounds  Ext: Bilateral AKA  neuro: No focal new neuro deficits   Skin: No rashes  Psych: Normal affect and demeanor, alert and oriented x3    Data Reviewed:  I have personally reviewed following labs and imaging studies  Micro Results Recent Results (from the past 240 hour(s))  MRSA PCR Screening     Status: None   Collection Time: 12/22/16 10:47 PM  Result Value Ref Range Status   MRSA by PCR NEGATIVE NEGATIVE Final    Comment:        The GeneXpert MRSA Assay (FDA approved for NASAL specimens only), is one component of a comprehensive MRSA colonization surveillance program. It is not intended to diagnose MRSA infection nor to guide or monitor treatment for MRSA infections.   Surgical PCR screen     Status: None   Collection Time: 12/31/16  5:00 AM  Result Value Ref Range Status   MRSA, PCR NEGATIVE NEGATIVE Final   Staphylococcus aureus NEGATIVE NEGATIVE Final    Comment:        The Xpert SA Assay (FDA approved for NASAL specimens in patients over 60 years of age), is one component of a comprehensive surveillance program.  Test performance has been validated by [facility] for patients greater than or equal to 66 year old. It is not intended to diagnose infection nor to guide or monitor treatment.     Radiology Reports Mr Knee Right Wo Contrast  Result Date: 12/22/2016 CLINICAL DATA:  Pain and swelling of the amputation stump of the right lower extremity x2 weeks. No fever. EXAM: MRI OF THE RIGHT KNEE WITHOUT CONTRAST TECHNIQUE: Multiplanar, multisequence MR imaging of the knee was performed. No  intravenous contrast was administered. COMPARISON:  Plain radiographs from 12/22/2016 FINDINGS: Susceptibility artifact is noted emanating from the posteromedial aspect of the stump. This slightly limits assessment. MENISCI Medial meniscus: Limited assessment of the posterior horn due to susceptibility artifacts adjacent to the posterior horn. No frank tear identified. Lateral meniscus:  Intact LIGAMENTS Cruciates:  Intact Collaterals: Medial collateral ligament is intact. Lateral collateral ligament complex is intact. CARTILAGE Patellofemoral: Mild chondromalacia of the lateral patellar cartilage with slight thinning of the trochlear cartilage suggested. Medial:  Irregular femorotibial partial-thickness cartilage loss. Lateral:  No chondral defect Joint:  No joint effusion. Normal Hoffa's fat. No plical thickening. Popliteal Fossa: Limited by susceptibility artifacts. No definite popliteal cyst identified though this region is obscured by artifacts. Extensor Mechanism:  Intact quadriceps tendon and patellar tendon. Bones: Faint marrow signal abnormalities along the surgical margin of the tibial shaft amputation. Changes of early osteomyelitis of concern. Other: Extensive stump edema of the surrounding soft tissues.No focal abscess or draining sinuses. IMPRESSION: 1. Minimal marrow signal abnormality noted at the tibial site of amputation. This in conjunction with surrounding cellulitis and edema raises the possibility of early osteomyelitis of the tibial stump. 2. No acute meniscal, cruciate or collateral ligament abnormality. The posterior horn of the medial meniscus is obscured by susceptibility artifacts. Electronically Signed   By: Ashley Royalty M.D.   On: 12/22/2016 19:15   Mr Hip Left Wo Contrast  Result Date: 12/22/2016 CLINICAL DATA:  Two weeks  of swelling.  No known trauma. EXAM: MR OF THE LEFT HIP WITHOUT CONTRAST TECHNIQUE: Multiplanar, multisequence MR imaging was performed. No intravenous contrast was  administered. COMPARISON:  None. FINDINGS: Bones: No hip fracture, dislocation or avascular necrosis. No periosteal reaction or bone destruction. No aggressive osseous lesion. Normal sacrum and sacroiliac joints. No SI joint widening or erosive changes. Degenerative disc disease with disc height loss at L4-5 and L5-S1. Articular cartilage and labrum Articular cartilage: High-grade partial-thickness cartilage loss of of the left hip. Partial-thickness cartilage loss of the right hip. Labrum: Grossly intact, but evaluation is limited by lack of intraarticular fluid. Joint or bursal effusion Joint effusion: Small right hip joint effusion. Small left hip joint effusion. No SI joint effusion. Bursae:  No bursa formation. Muscles and tendons Flexors: Mild muscle edema in the iliacus muscle bilaterally. Extensors:  Mild muscle edema in the right vastus lateralis muscle. Abductors: Normal. Adductors: Mild muscle edema in the right adductor magnus muscle. Gluteals: Muscle edema in the left superior gluteus maximus muscle. Hamstrings: Normal. Other findings Miscellaneous: No pelvic free fluid. No fluid collection or hematoma. No inguinal lymphadenopathy. No inguinal hernia. IMPRESSION: 1. No hip fracture, dislocation or avascular necrosis. 2. Moderate osteoarthritis of the left hip. Mild osteoarthritis of the right hip. 3. Mild muscle strain involving the the right vastus lateralis muscle, right adductor magnus muscle and left superior gluteus maximus muscle. Electronically Signed   By: Kathreen Devoid   On: 12/22/2016 17:39   Dg Knee Complete 4 Views Right  Result Date: 12/28/2016 CLINICAL DATA:  Right knee pain and history of cellulitis and early osteomyelitis. Wound over end of right below-knee amputation. EXAM: RIGHT KNEE - COMPLETE 4+ VIEW COMPARISON:  12/22/2016 FINDINGS: Evidence of patient's below-knee amputation unchanged. Diffuse osteopenia. Tricompartmental osteoarthritic change. No definite evidence of bone  destruction over the tibial stump to suggest osteomyelitis. Moderate calcified atherosclerotic plaque over the femoral popliteal arteries. IMPRESSION: No definite evidence of osteomyelitis. Below-knee amputation unchanged. Mild tricompartmental osteoarthritis. Atherosclerotic disease. Electronically Signed   By: Marin Olp M.D.   On: 12/28/2016 18:48   Dg Knee Complete 4 Views Right  Result Date: 12/22/2016 CLINICAL DATA:  Patient complains of 2 weeks of swelling to right stump, denies any trauma and does not wear prosthetic. Also complains of left hip pain with with movement. States that he is suppose to be scheduled for appointment at wound clinic in future. No fever, NAD. Noticeable green circular scab on bottom of right stump. EXAM: RIGHT KNEE - COMPLETE 4+ VIEW COMPARISON:  None. FINDINGS: Status post below-the-knee amputation. The amputated margins of the remaining tibia and fibula are well-defined. There is no bone resorption to suggest osteomyelitis. No fracture. Bones are demineralized. There is mild medial and lateral femorotibial joint space compartment narrowing. No knee joint effusion. Dense vascular calcifications are noted along the distal superficial femoral artery and popliteal artery. Soft tissue edema is seen overlying the stump. No soft tissue air. A small metallic foreign body projects within the posterior subcutaneous soft tissues at the level of the knee. IMPRESSION: 1. No fracture.  No evidence of osteomyelitis. 2. Soft tissue swelling overlying the amputated distal tibia and fibula. Electronically Signed   By: Lajean Manes M.D.   On: 12/22/2016 13:19   Dg Hip Unilat With Pelvis 2-3 Views Left  Result Date: 12/22/2016 CLINICAL DATA:  Patient complains of 2 weeks of swelling to right stump, denies any trauma and does not wear prosthetic. Also complains of left hip pain with  with movement. States that he is suppose to be scheduled for appointment at wound clinic in future. No fever,  NAD. Noticeable green circular scab on bottom of right stump. EXAM: DG HIP (WITH OR WITHOUT PELVIS) 2-3V LEFT COMPARISON:  None. FINDINGS: No fracture.  No bone lesion. The hip joint is normally aligned. There is mild concentric hip joint space narrowing. Small marginal osteophytes project from the inferior femoral head. Bones are demineralized. There is dense calcification along the iliac and femoral vessels. IMPRESSION: 1. No fracture or bone lesion. 2. Mild hip joint arthropathic change. Electronically Signed   By: Lajean Manes M.D.   On: 12/22/2016 13:21    Lab Data:  CBC:  Recent Labs Lab 12/28/16 2000 12/30/16 2130 12/31/16 0700 01/01/17 0536  WBC 6.2 8.0 7.7 8.1  NEUTROABS 4.7  --   --   --   HGB 11.8* 12.1* 10.8* 10.5*  HCT 40.4 40.9 37.3* 35.7*  MCV 99.3 97.8 97.6 97.3  PLT 249 246 256 161   Basic Metabolic Panel:  Recent Labs Lab 12/28/16 2000 12/31/16 0656 12/31/16 0700 01/01/17 0536  NA 138  --  137 137  K 4.7  --  4.8 5.1  CL 96*  --  98* 92*  CO2 29  --  26 29  GLUCOSE 93  --  88 111*  BUN 41*  --  41* 58*  CREATININE 6.79*  --  7.54* 9.04*  CALCIUM 9.0  --  9.0 8.5*  PHOS  --  5.9*  --   --    GFR: Estimated Creatinine Clearance: 5.8 mL/min (A) (by C-G formula based on SCr of 9.04 mg/dL (H)). Liver Function Tests:  Recent Labs Lab 12/31/16 0700  AST 13*  ALT 13*  ALKPHOS 66  BILITOT 0.5  PROT 6.2*  ALBUMIN 2.7*   No results for input(s): LIPASE, AMYLASE in the last 168 hours. No results for input(s): AMMONIA in the last 168 hours. Coagulation Profile:  Recent Labs Lab 12/31/16 0700  INR 1.10   Cardiac Enzymes: No results for input(s): CKTOTAL, CKMB, CKMBINDEX, TROPONINI in the last 168 hours. BNP (last 3 results) No results for input(s): PROBNP in the last 8760 hours. HbA1C: No results for input(s): HGBA1C in the last 72 hours. CBG: No results for input(s): GLUCAP in the last 168 hours. Lipid Profile: No results for input(s): CHOL,  HDL, LDLCALC, TRIG, CHOLHDL, LDLDIRECT in the last 72 hours. Thyroid Function Tests: No results for input(s): TSH, T4TOTAL, FREET4, T3FREE, THYROIDAB in the last 72 hours. Anemia Panel: No results for input(s): VITAMINB12, FOLATE, FERRITIN, TIBC, IRON, RETICCTPCT in the last 72 hours. Urine analysis:    Component Value Date/Time   COLORURINE YELLOW 06/22/2014 [place] 06/22/2014 0531   LABSPEC 1.015 06/22/2014 0531   PHURINE 6.0 06/22/2014 0531   GLUCOSEU 100 (A) 06/22/2014 0531   HGBUR SMALL (A) 06/22/2014 0531   HGBUR trace-lysed 06/03/2008 0855   BILIRUBINUR NEGATIVE 06/22/2014 0531   KETONESUR TRACE (A) 06/22/2014 0531   PROTEINUR 100 (A) 06/22/2014 0531   UROBILINOGEN 0.2 06/22/2014 0531   NITRITE NEGATIVE 06/22/2014 0531   LEUKOCYTESUR NEGATIVE 06/22/2014 0531     Ripudeep Rai M.D. [facility] 01/01/2017, 1:05 PM  Pager: [phone] Between 7am to 7pm - call Pager - [phone]  After 7pm go to [email or web address] - password [email or web address]  Call night coverage person covering after 7pm

## 2017-01-01 NOTE — Procedures (Signed)
Pt seen on HD.  Ap 190 Vp 200.  BFR 400.  SBP 111. CO pain Rt stump.  Tolerating HD well so far.

## 2017-01-01 NOTE — Progress Notes (Signed)
PT Cancellation Note  Patient Details Name: Randy [name] MRN: [id number] DOB: 29-Sep-1931   Cancelled Treatment:    Reason Eval/Treat Not Completed: Patient at procedure or test/unavailable.  Pt is in hemodialysis.  PT will check back at a later time/date to complete evaluation.    Thanks,    Barbarann Ehlers. Bailei Buist, PT, DPT [phone]   01/01/2017, 2:15 PM

## 2017-01-01 NOTE — Progress Notes (Signed)
   VASCULAR SURGERY ASSESSMENT & PLAN:   1 Day Post-Op s/p: Right AKA  Dressing change tomorrow.  CIR vs PTx and then home with HHPTx.   SUBJECTIVE:   Pain adequately controlled.  PHYSICAL EXAM:   Vitals:   12/31/16 1228 12/31/16 1400 12/31/16 2155 01/01/17 0524  BP: 125/76 124/64 133/65 118/67  Pulse: 83 70 71 75  Resp:  16 16 16   Temp: 98.1 F (36.7 C) 98.7 F (37.1 C) 98.7 F (37.1 C) 99.9 F (37.7 C)  TempSrc: Oral Oral Oral Oral  SpO2: 96% 96% 96% 92%  Weight:      Height:       The dressing on his right AKA is dry.  LABS:   Lab Results  Component Value Date   WBC 7.7 12/31/2016   HGB 10.8 (L) 12/31/2016   HCT 37.3 (L) 12/31/2016   MCV 97.6 12/31/2016   PLT 256 12/31/2016   Lab Results  Component Value Date   CREATININE 7.54 (H) 12/31/2016   Lab Results  Component Value Date   INR 1.10 12/31/2016   CBG (last 3)  No results for input(s): GLUCAP in the last 72 hours.  PROBLEM LIST:    Principal Problem:   Osteomyelitis ([place]) Active Problems:   End stage renal disease, has been peritoneal, now hemodialysis   Anemia of chronic disease   Chronic combined systolic and diastolic CHF (congestive heart failure) (HCC)   Status post above knee amputation of left lower extremity (HCC)   Benign essential HTN   AF (paroxysmal atrial fibrillation) (HCC)   CURRENT MEDS:   . acetaminophen  1,000 mg Oral TID  . amLODipine  5 mg Oral Daily  . atorvastatin  80 mg Oral QPC supper  . brimonidine  1 drop Both Eyes BID  . calcitRIOL  3 mcg Oral Q M,W,F-HD  . ceftAZIDime (FORTAZ) IVPB 2 gram/50 mL D5W (Pyxis)  2 g Intravenous Q M,W,F-2000  . cinacalcet  60 mg Oral QPC supper  . docusate sodium  100 mg Oral Daily  . feeding supplement  1 Container Oral Daily  . feeding supplement (NEPRO CARB STEADY)  237 mL Oral BID BM  . gabapentin  300 mg Oral QHS  . heparin  5,000 Units Subcutaneous Q8H  . isosorbide mononitrate  15 mg Oral QHS  . lisinopril  40 mg Oral  Daily  . multivitamin  1 tablet Oral QHS  . pantoprazole  40 mg Oral Q1200  . sevelamer carbonate  2,400 mg Oral TID WC  . timolol  1 drop Both Eyes BID  . Vancomycin  750 mg Intravenous Q M,W,F-HD    [name] Beeper: [phone] Office: [phone] 01/01/2017

## 2017-01-01 NOTE — Consult Note (Signed)
Physical Medicine and Rehabilitation Consult Reason for Consult: Decreased functional mobility secondary to right AKA Referring Physician: [facility]   HPI: Randy [name] is a 83 y.o. 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 Per chart review patient lives with wife in [place]. He used a right prosthesis but limited due to stump pain. He has two power chairs. One level home with ramp to entrance. Wife and the daughter can assist. Presented 12/30/2016 with progressive right BKA stump pain and swelling. MRI suggestive of osteomyelitis/cellulitis. He had recently been placed on vancomycin and Tressie Ellis that he was receiving with his dialysis. He developed a wound over the distal aspect of BKA site with ischemic changes. No change with conservative care and underwent right AKA 12/31/2016 per Dr. Scot Dock. Hospital course pain management. Hemodialysis ongoing as per renal services. Subcutaneous heparin for DVT prophylaxis. Physical and occupational therapy evaluations pending. M.D. has requested physical medicine rehabilitation consult.   Review of Systems  Constitutional: Negative for chills and fever.  HENT: Negative for hearing loss.   Eyes: Negative for blurred vision and double vision.  Respiratory: Positive for shortness of breath. Negative for cough.   Cardiovascular: Positive for palpitations and leg swelling. Negative for chest pain.  Gastrointestinal: Positive for constipation. Negative for nausea.  Genitourinary: Negative for dysuria and hematuria.  Musculoskeletal: Positive for joint pain and myalgias.  Skin: Negative for rash.  Neurological: Positive for weakness. Negative for seizures.  All other systems reviewed and are  negative.  Past Medical History:  Diagnosis Date  . Anemia of chronic disease   . Arthritis   . CAD (coronary artery disease)    Moderate multivessel disease 07/2015 - managed medically  . ESRD on hemodialysis [facility]    M/W/F in [place] - Dr. Florene Glen  . Essential hypertension   . Gout   . History of blood transfusion   . History of myopericarditis 2015   07/2015  . History of pneumonia 2014  . History of stroke   . Hypercholesterolemia   . Nonischemic cardiomyopathy (HCC)    LVEF 35-40%  . Peripheral vascular disease ([place])    Status post right below knee amputation for a nonhealing wound of the right foot 12/2014  . Pneumonia    Past Surgical History:  Procedure Laterality Date  . ABOVE KNEE LEG AMPUTATION Left 10/10/2015  . AMPUTATION Right 01/05/2015   BKA  . AMPUTATION Left 10/10/2015   Procedure: AMPUTATION ABOVE KNEE LEFT;  Surgeon: Angelia Mould, MD;  Location: [facility];  Service: Vascular;  Laterality: Left;  . AV FISTULA PLACEMENT  08/24/2012   Procedure: ARTERIOVENOUS (AV) FISTULA CREATION;  Surgeon: Rosetta Posner, MD;  Location: [facility];  Service: Vascular;  Laterality: Left;  . BACK SURGERY    . CAPD REMOVAL N/A 03/13/2015   Procedure: CONTINUOUS AMBULATORY PERITONEAL DIALYSIS  (CAPD) CATHETER REMOVAL;  Surgeon: Coralie Keens, MD;  Location: [facility];  Service: General;  Laterality: N/A;  . CARDIAC CATHETERIZATION    . CHOLECYSTECTOMY    . COLONOSCOPY     remote past by Dr. Tamala Julian in [place], [place]   . COLONOSCOPY Left 09/26/2014   Dr. Paulita Fujita: old and semi-fresh blood scattered throughout the colon, several medium-sized diverticula, no obvious focal bleeding site, no blood in TI, several polyps  seen in cecum, ascending colon and proximal transverse s/p biopsy (tubular adenomas)  . COLONOSCOPY N/A 03/05/2016   Dr. Oneida Alar: old blood in ileum, few fresh clots ascending colon, frequent large mouth diverticula and old clots in descending/sigmoid. 8 polyps in ascending colon  not manipulated  . ESOPHAGOGASTRODUODENOSCOPY N/A 03/05/2016   Dr. Oneida Alar: hiatal hernia, mild gastritis   . GANGLION CYST EXCISION  01/03/2012   Procedure: REMOVAL GANGLION OF WRIST;  Surgeon: Scherry Ran, MD;  Location: [facility];  Service: General;  Laterality: Right;  . GIVENS CAPSULE STUDY N/A 03/07/2016   Chancellor: dark material in ileum, with fresh blood in right colon   . HEMORRHOID SURGERY  1970's  . INSERTION OF DIALYSIS CATHETER Right     neck  . INSERTION OF DIALYSIS CATHETER  10/19/2012   Procedure: INSERTION OF DIALYSIS CATHETER;  Surgeon: Rosetta Posner, MD;  Location: [facility];  Service: Vascular;  Laterality: N/A;  [place] ARTHROSCOPY Right 2007  . LEFT HEART CATHETERIZATION WITH CORONARY ANGIOGRAM N/A 08/17/2014   Procedure: LEFT HEART CATHETERIZATION WITH CORONARY ANGIOGRAM;  Surgeon: Larey Dresser, MD;  Location[facility] CATH LAB;  Service: Cardiovascular;  Laterality: N/A;  . LUMBAR [place] SURGERY  2004   Family History  Problem Relation Age of Onset  . Stroke Mother   . Arthritis    . Cancer    . Kidney disease    . Cancer Sister   . Colon cancer Brother     unclear   . Colon cancer Brother   . Anesthesia problems Neg Hx   . Hypotension Neg Hx   . Malignant hyperthermia Neg Hx   . Pseudochol deficiency Neg Hx    Social History:  reports that he quit smoking about 18 years ago. His smoking use included Cigarettes. He has a 30.00 pack-year smoking history. He quit smokeless tobacco use about 23 years ago. His smokeless tobacco use included Chew. He reports that he does not drink alcohol or use drugs. Allergies: No Known Allergies Medications Prior to Admission  Medication Sig Dispense Refill  . amLODipine (NORVASC) 5 MG tablet Take 5 mg by mouth daily.    Marland Kitchen atorvastatin (LIPITOR) 80 MG tablet Take 1 tablet (80 mg total) by mouth daily. (Patient taking differently: Take 80 mg by mouth daily after supper. ) 90 tablet 3  . brimonidine (ALPHAGAN)  0.2 % ophthalmic solution Place 1 drop into both eyes 2 (two) times daily.    . cefTAZidime 2 g in dextrose 5 % 50 mL Inject 2 g into the vein every Monday, Wednesday, and Friday at 8 PM.    . cinacalcet (SENSIPAR) 60 MG tablet Take 60 mg by mouth daily after supper.     . diclofenac sodium (VOLTAREN) 1 % GEL Apply 1 application topically 3 (three) times daily as needed (pain).   98  . docusate sodium (COLACE) 100 MG capsule Take 2 capsules (200 mg total) by mouth daily. (Patient taking differently: Take 200-300 mg by mouth daily as needed for mild constipation or moderate constipation. ) 10 capsule 0  . feeding supplement (BOOST / RESOURCE BREEZE) LIQD Take 1 Container by mouth 4 (four) times daily -  with meals and at bedtime. (Patient taking differently: Take 1 Container by mouth daily. ) 14 Container 0  . gabapentin (NEURONTIN) 300 MG capsule Take 300 mg by mouth at bedtime.    . hydrALAZINE (APRESOLINE) 50 MG tablet Take 50 mg by mouth 2 (two) times  daily as needed (high blood pressure >160/80).     Marland Kitchen HYDROcodone-acetaminophen (NORCO/VICODIN) 5-325 MG tablet Take 1 tablet by mouth every 4 (four) hours as needed (pain). 15 tablet 0  . isosorbide mononitrate (IMDUR) 30 MG 24 hr tablet TAKE 1/2 TABLET BY MOUTH AT BEDTIME. 45 tablet 1  . ketoconazole (NIZORAL) 2 % cream Apply 1 application topically daily as needed for irritation (itching).   0  . lidocaine-prilocaine (EMLA) cream Apply 1 application topically every Monday, Wednesday, and Friday. For dialysis  10  . lisinopril (PRINIVIL,ZESTRIL) 40 MG tablet Take 40 mg by mouth daily.  2  . nitroGLYCERIN (NITROSTAT) 0.4 MG SL tablet Place 1 tablet (0.4 mg total) under the tongue every 5 (five) minutes as needed for chest pain. 30 tablet 12  . oxyCODONE-acetaminophen (PERCOCET/ROXICET) 5-325 MG tablet Take 1 tablet by mouth every 6 (six) hours as needed. 15 tablet 0  . pantoprazole (PROTONIX) 40 MG tablet Take 1 tablet (40 mg total) by mouth daily at  12 noon.    . sevelamer carbonate (RENVELA) 800 MG tablet Take 1,600-2,400 mg by mouth See admin instructions. Take 3 tablets (2400 mg) with meals and 2 tablets (1600 mg) with snacks    . timolol (BETIMOL) 0.5 % ophthalmic solution Place 1 drop into both eyes 2 (two) times daily.    . Vancomycin (VANCOCIN) 750-5 MG/150ML-% SOLN Inject 150 mLs (750 mg total) into the vein every Monday, Wednesday, and Friday with hemodialysis. 4000 mL     Home: Home Living Family/patient expects to be discharged to:: Private residence Living Arrangements: Spouse/significant other  Functional History:   Functional Status:  Mobility:          ADL:    Cognition: Cognition Orientation Level: Oriented X4    Blood pressure 118/67, pulse 75, temperature 99.9 F (37.7 C), temperature source Oral, resp. rate 16, height 5\' 8"  (1.727 m), weight 65.8 kg (145 lb), SpO2 92 %. Physical Exam  Vitals reviewed. Constitutional: He is oriented to person, place, and time.  HENT:  Head: Normocephalic.  Eyes: EOM are normal.  Neck: Normal range of motion. Neck supple. No thyromegaly present.  Cardiovascular:  Cardiac rate control  Respiratory: Effort normal and breath sounds normal.  GI: Soft. Bowel sounds are normal. He exhibits no distension.  Neurological: He is alert and oriented to person, place, and time.  Skin:  Right AKA is dressed appropriately tender. Left AKA well-healed  Motor strength is 5/5 bilateral deltoid, bicep, triceps, grip, 5/5 in the left hip flexor and left hip adductor and abductor. Right lower limb does not move due to pain  Results for orders placed or performed during the hospital encounter of 12/30/16 (from the past 24 hour(s))  Phosphorus     Status: Abnormal   Collection Time: 12/31/16  6:56 AM  Result Value Ref Range   Phosphorus 5.9 (H) 2.5 - 4.6 mg/dL  Comprehensive metabolic panel     Status: Abnormal   Collection Time: 12/31/16  7:00 AM  Result Value Ref Range   Sodium  137 135 - 145 mmol/L   Potassium 4.8 3.5 - 5.1 mmol/L   Chloride 98 (L) 101 - 111 mmol/L   CO2 26 22 - 32 mmol/L   Glucose, Bld 88 65 - 99 mg/dL   BUN 41 (H) 6 - 20 mg/dL   Creatinine, Ser 7.54 (H) 0.61 - 1.24 mg/dL   Calcium 9.0 8.9 - 10.3 mg/dL   Total Protein 6.2 (L) 6.5 - 8.1 g/dL  Albumin 2.7 (L) 3.5 - 5.0 g/dL   AST 13 (L) 15 - 41 U/L   ALT 13 (L) 17 - 63 U/L   Alkaline Phosphatase 66 38 - 126 U/L   Total Bilirubin 0.5 0.3 - 1.2 mg/dL   GFR calc non Af Amer 6 (L) >60 mL/min   GFR calc Af Amer 7 (L) >60 mL/min   Anion gap 13 5 - 15  CBC     Status: Abnormal   Collection Time: 12/31/16  7:00 AM  Result Value Ref Range   WBC 7.7 4.0 - 10.5 K/uL   RBC 3.82 (L) 4.22 - 5.81 MIL/uL   Hemoglobin 10.8 (L) 13.0 - 17.0 g/dL   HCT 37.3 (L) 39.0 - 52.0 %   MCV 97.6 78.0 - 100.0 fL   MCH 28.3 26.0 - 34.0 pg   MCHC 29.0 (L) 30.0 - 36.0 g/dL   RDW 19.8 (H) 11.5 - 15.5 %   Platelets 256 150 - 400 K/uL  Protime-INR     Status: None   Collection Time: 12/31/16  7:00 AM  Result Value Ref Range   Prothrombin Time 14.3 11.4 - 15.2 seconds   INR 1.10   APTT     Status: Abnormal   Collection Time: 12/31/16  7:00 AM  Result Value Ref Range   aPTT 62 (H) 24 - 36 seconds   No results found.  Assessment/Plan: Diagnosis: Right above-knee amputation, postoperative day #1, with prior history of left above knee amputation 1. Does the need for close, 24 hr/day medical supervision in concert with the patient's rehab needs make it unreasonable for this patient to be served in a less intensive setting? Potentially 2. Co-Morbidities requiring supervision/potential complications: End-stage renal disease, atrial fibrillation, peripheral vascular disease 3. Due to bladder management, bowel management, safety, skin/wound care, disease management, medication administration, pain management and patient education, does the patient require 24 hr/day rehab nursing? Yes 4. Does the patient require coordinated  care of a physician, rehab nurse, PT (1-2 hrs/day, 5 days/week) and OT (1-2 hrs/day, 5 days/week) to address physical and functional deficits in the context of the above medical diagnosis(es)? Potentially Addressing deficits in the following areas: balance, endurance, locomotion, strength, transferring, bowel/bladder control, bathing, dressing and psychosocial support 5. Can the patient actively participate in an intensive therapy program of at least 3 hrs of therapy per day at least 5 days per week? Potentially 6. The potential for patient to make measurable gains while on inpatient rehab is fair 7. Anticipated functional outcomes upon discharge from inpatient rehab are n/a  with PT, n/a with OT, n/a with SLP. 8. Estimated rehab length of stay to reach the above functional goals is: NA 9. Does the patient have adequate social supports and living environment to accommodate these discharge functional goals? Yes 10. Anticipated D/C setting: Home 11. Anticipated post D/C treatments: [place] therapy 12. Overall Rehab/Functional Prognosis: good  RECOMMENDATIONS: This patient's condition is appropriate for continued rehabilitative care in the following setting: if pt unable to resume ModI /Sup level with transfers then CIR Patient has agreed to participate in recommended program. Yes Note that insurance prior authorization may be required for reimbursement for recommended care.  Comment: Patient will probably resume transfers in and out of bed to power chair at a modified level, if his pain is well controlled.   ANGIULLI,DANIEL J., PA-C 01/01/2017

## 2017-01-02 ENCOUNTER — Inpatient Hospital Stay (HOSPITAL_COMMUNITY): Admission: RE | Admit: 2017-01-02 | Payer: Medicare Other | Source: Ambulatory Visit | Admitting: Vascular Surgery

## 2017-01-02 ENCOUNTER — Encounter (HOSPITAL_COMMUNITY): Admission: RE | Payer: Self-pay | Source: Ambulatory Visit

## 2017-01-02 LAB — GLUCOSE, CAPILLARY
Glucose-Capillary: 96 mg/dL (ref 65–99)
Glucose-Capillary: 122 mg/dL — ABNORMAL HIGH (ref 65–99)
Glucose-Capillary: 115 mg/dL — ABNORMAL HIGH (ref 65–99)
Glucose-Capillary: 131 mg/dL — ABNORMAL HIGH (ref 65–99)

## 2017-01-02 LAB — CBC
HEMATOCRIT: 36 % — AB (ref 39.0–52.0)
HEMOGLOBIN: 10.4 g/dL — AB (ref 13.0–17.0)
MCH: 28.4 pg (ref 26.0–34.0)
MCHC: 28.9 g/dL — AB (ref 30.0–36.0)
MCV: 98.4 fL (ref 78.0–100.0)
Platelets: 221 10*3/uL (ref 150–400)
RBC: 3.66 MIL/uL — ABNORMAL LOW (ref 4.22–5.81)
RDW: 20.1 % — ABNORMAL HIGH (ref 11.5–15.5)
WBC: 6.6 10*3/uL (ref 4.0–10.5)

## 2017-01-02 LAB — BASIC METABOLIC PANEL
ANION GAP: 12 (ref 5–15)
BUN: 26 mg/dL — ABNORMAL HIGH (ref 6–20)
CO2: 28 mmol/L (ref 22–32)
Calcium: 8.9 mg/dL (ref 8.9–10.3)
Chloride: 98 mmol/L — ABNORMAL LOW (ref 101–111)
Creatinine, Ser: 5.51 mg/dL — ABNORMAL HIGH (ref 0.61–1.24)
GFR calc Af Amer: 10 mL/min — ABNORMAL LOW (ref >60)
GFR calc non Af Amer: 9 mL/min — ABNORMAL LOW (ref >60)
Glucose, Bld: 92 mg/dL (ref 65–99)
POTASSIUM: 4.2 mmol/L (ref 3.5–5.1)
Sodium: 138 mmol/L (ref 135–145)

## 2017-01-02 SURGERY — AMPUTATION, ABOVE KNEE
Anesthesia: General | Laterality: Right

## 2017-01-02 MED ORDER — GABAPENTIN 300 MG PO CAPS
300.0000 mg | ORAL_CAPSULE | Freq: Two times a day (BID) | ORAL | Status: DC
  Administered 2017-01-02 – 2017-01-03 (×3): 300 mg via ORAL
  Filled 2017-01-02 (×3): qty 1

## 2017-01-02 NOTE — Progress Notes (Signed)
Orthopedic Tech Progress Note Patient Details:  Randy [name] Nov 04, 1931 [id number] Brace completed by bio-tech Patient ID: [name], male   DOB: 28-Jun-1932, 83 y.o.   MRN: [id number]   Braulio Bosch 01/02/2017, 5:43 PM

## 2017-01-02 NOTE — Progress Notes (Signed)
I met with pt at bedside to discuss a possible inpt rehab admission. He waivers between wishing to d/c directly home, and admission to inpt rehab. I will contact his wife to discuss. I await therapy evals to assist with plans. [phone]

## 2017-01-02 NOTE — Progress Notes (Signed)
[facility]                                                                              Patient Demographics  Randy [name], is a 83 y.o. male, DOB - March 05, 1932, SMO:[id number]  Admit date - 12/30/2016   Admitting Physician [name], MD  Outpatient Primary MD for the patient is [name], MD  Outpatient specialists:   LOS - 2  days    Chief Complaint  Patient presents with  . Leg Pain       Brief summary   83 y.o.malewith a past medical history significant for ESRD on HD MWF, CAD, HTN, NICM, PVD s/p L AKA and right BKA, and Afib not on AC due to hx GIBwho presents with leg pain. 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  For past few weeks he developed new pain in his right BKA stump with new swelling. He was admitted here for 2 days about a week ago, MRI showed probably early osteomyelitis and cellulitis. It was discussed with ID on the  phone who recommended IV vancomycin and Fortaz, which he has been getting with HD. He was seen during that hospitalization by Dr. Donnetta Hutching who noted patient had been seen for similar symptoms back in Nov 2017 and recommended  possible AKA which was planned for near future. But since pain was unbearable, he came to the ED.  Patient was admitted to hospitalist service. He underwent right AKA on 4/10   Assessment & Plan   Principal Problem:  Right BKA stump with ischemic pain, Osteomyelitis? - Vascular surgery was consulted, patient underwent right AKA on 4/10. -  patient was placed on vancomycin and Fortaz empirically for osteomyelitis, Will likely not need any antibiotics at discharge - Continue  Post op care. Prn IV dialaudid q3h for pain.  - Vascular surgery recommending inpatient rehabilitation  - Increase Neurontin to 300 mg twice a day  Active Problems:   End stage renal disease,on  HD ( M,W,F) - Sees Dr Florene Glen.  -continue sensipar and renvela -Nephrology consulted, hemodialysis per his schedule     Anemia of chronic disease - Hemoglobin currently stable     Chronic combined systolic and diastolic CHF (congestive heart failure) (HCC) euvolemic. volume management with HD Continue lisinopril, imdur and hydralazine. Continue statin  Essential HTN  on amlodipine, ACEi, imdur and hydralazine    AF (paroxysmal atrial fibrillation) (HCC) Not on anticoagulation due to history of GI bleed.  Code Status: Full code DVT Prophylaxis:  Lovenox  Family Communication: Discussed in detail with the patient, all imaging results, lab results explained to the patient and daughter  Disposition Plan: Possibly tomorrow inpatient rehabilitation if bed available, awaiting PT evaluation  Time Spent in minutes  15 minutes  Procedures:  Right AKA   Consultants:   Vascular surgery Nephrology   Antimicrobials  Vancomycin    Tressie Ellis    Medications  Scheduled Meds: . acetaminophen  1,000 mg Oral TID  . amLODipine  5 mg Oral Daily  .  atorvastatin  80 mg Oral QPC supper  . brimonidine  1 drop Both Eyes BID  . calcitRIOL  3 mcg Oral Q M,W,F-HD  . ceftAZIDime (FORTAZ) IVPB 2 gram/50 mL D5W (Pyxis)  2 g Intravenous Q M,W,F-2000  . cinacalcet  60 mg Oral QPC supper  . docusate sodium  100 mg Oral Daily  . feeding supplement  1 Container Oral Daily  . feeding supplement (NEPRO CARB STEADY)  237 mL Oral BID BM  . gabapentin  300 mg Oral BID  . heparin  5,000 Units Subcutaneous Q8H  . isosorbide mononitrate  15 mg Oral QHS  . lisinopril  40 mg Oral Daily  . multivitamin  1 tablet Oral QHS  . pantoprazole  40 mg Oral Q1200  . sevelamer carbonate  2,400 mg Oral TID WC  . timolol  1 drop Both Eyes BID  . Vancomycin  750 mg Intravenous Q M,W,F-HD   Continuous Infusions: PRN Meds:.sodium chloride, sodium chloride, acetaminophen **OR** acetaminophen, alteplase,  guaiFENesin-dextromethorphan, heparin, hydrALAZINE, HYDROmorphone (DILAUDID) injection, labetalol, lidocaine (PF), lidocaine-prilocaine, metoprolol, ondansetron **OR** ondansetron (ZOFRAN) IV, oxyCODONE-acetaminophen, pentafluoroprop-tetrafluoroeth, phenol, polyethylene glycol, sevelamer carbonate   Antibiotics   Anti-infectives    Start     Dose/Rate Route Frequency Ordered Stop   01/01/17 2000  cefTAZidime (FORTAZ) 2 g in dextrose 5 % 50 mL IVPB     2 g 100 mL/hr over 30 Minutes Intravenous Every M-W-F (2000) 12/31/16 0020     01/01/17 1200  vancomycin (VANCOCIN) IVPB 750 mg/150 ml premix     750 mg 150 mL/hr over 60 Minutes Intravenous Every M-W-F (Hemodialysis) 12/31/16 0020     12/31/16 2200  cefUROXime (ZINACEF) 1.5 g in dextrose 5 % 50 mL IVPB  Status:  Discontinued     1.5 g 100 mL/hr over 30 Minutes Intravenous Every 12 hours 12/31/16 1201 12/31/16 1229   12/31/16 0903  cefUROXime (ZINACEF) 1.5 g in dextrose 5 % 50 mL IVPB     1.5 g 100 mL/hr over 30 Minutes Intravenous 30 min pre-op 12/31/16 0903 12/31/16 1032   12/31/16 0902  dextrose 5 % with cefUROXime (ZINACEF) ADS Med    Comments:  [name]   : cabinet override      12/31/16 0902 12/31/16 1017        [name] Complaining of pain in his leg 6 out of 10, no fevers or chills. Patient denies dizziness, chest pain, shortness of breath, abdominal pain, N/V/D/C, new weakness, numbess, tingling. No acute events overnight.    Objective:   Vitals:   01/01/17 1530 01/01/17 1545 01/01/17 2210 01/02/17 0636  BP: (!) 130/97 (!) 159/84 105/67 111/63  Pulse: 93 79 79 82  Resp: 18 19 18 18   Temp:  98.9 F (37.2 C) 98.4 F (36.9 C) 99 F (37.2 C)  TempSrc:  Oral Oral Oral  SpO2:  98% 94% 95%  Weight:  64 kg (141 lb 1.5 oz)    Height:        Intake/Output Summary (Last 24 hours) at 01/02/17 1513 Last data filed at 01/02/17 1502  Gross per 24 hour  Intake               770 ml  Output             3600 ml  Net            -2830 ml     Wt Readings from Last 3 Encounters:  01/01/17 64 kg (141 lb 1.5 oz)  12/28/16 65.8 kg (145 lb)  12/24/16 66.1 kg (145 lb 11.6 oz)     Exam  General: Alert and oriented x 3, NAD  HEENT:   Neck: Supple, no JVD  Cardiovascular: S1 Clear, RRR  Respiratory : Clear to auscultation bilaterally, no wheezing, rales or rhonchi  Gastrointestinal: Soft, nontender, nondistended, + bowel sounds  Ext: Bilateral AKA  neuro: No focal new neuro deficits   Skin: No rashes  Psych: Normal affect and demeanor, alert and oriented x3    Data Reviewed:  I have personally reviewed following labs and imaging studies  Micro Results Recent Results (from the past 240 hour(s))  Surgical PCR screen     Status: None   Collection Time: 12/31/16  5:00 AM  Result Value Ref Range Status   MRSA, PCR NEGATIVE NEGATIVE Final   Staphylococcus aureus NEGATIVE NEGATIVE Final    Comment:        The Xpert SA Assay (FDA approved for NASAL specimens in patients over 53 years of age), is one component of a comprehensive surveillance program.  Test performance has been validated by [facility] for patients greater than or equal to 20 year old. It is not intended to diagnose infection nor to guide or monitor treatment.     Radiology Reports Mr Knee Right Wo Contrast  Result Date: 12/22/2016 CLINICAL DATA:  Pain and swelling of the amputation stump of the right lower extremity x2 weeks. No fever. EXAM: MRI OF THE RIGHT KNEE WITHOUT CONTRAST TECHNIQUE: Multiplanar, multisequence MR imaging of the knee was performed. No intravenous contrast was administered. COMPARISON:  Plain radiographs from 12/22/2016 FINDINGS: Susceptibility artifact is noted emanating from the posteromedial aspect of the stump. This slightly limits assessment. MENISCI Medial meniscus: Limited assessment of the posterior horn due to susceptibility artifacts adjacent to the  posterior horn. No frank tear identified. Lateral meniscus:  Intact LIGAMENTS Cruciates:  Intact Collaterals: Medial collateral ligament is intact. Lateral collateral ligament complex is intact. CARTILAGE Patellofemoral: Mild chondromalacia of the lateral patellar cartilage with slight thinning of the trochlear cartilage suggested. Medial:  Irregular femorotibial partial-thickness cartilage loss. Lateral:  No chondral defect Joint:  No joint effusion. Normal Hoffa's fat. No plical thickening. Popliteal Fossa: Limited by susceptibility artifacts. No definite popliteal cyst identified though this region is obscured by artifacts. Extensor Mechanism:  Intact quadriceps tendon and patellar tendon. Bones: Faint marrow signal abnormalities along the surgical margin of the tibial shaft amputation. Changes of early osteomyelitis of concern. Other: Extensive stump edema of the surrounding soft tissues.No focal abscess or draining sinuses. IMPRESSION: 1. Minimal marrow signal abnormality noted at the tibial site of amputation. This in conjunction with surrounding cellulitis and edema raises the possibility of early osteomyelitis of the tibial stump. 2. No acute meniscal, cruciate or collateral ligament abnormality. The posterior horn of the medial meniscus is obscured by susceptibility artifacts. Electronically Signed   By: Ashley Royalty M.D.   On: 12/22/2016 19:15   Mr Hip Left Wo Contrast  Result Date: 12/22/2016 CLINICAL DATA:  Two weeks of swelling.  No known trauma. EXAM: MR OF THE LEFT HIP WITHOUT CONTRAST TECHNIQUE: Multiplanar, multisequence MR imaging was performed. No intravenous contrast was administered. COMPARISON:  None. FINDINGS: Bones: No hip fracture, dislocation or avascular necrosis. No periosteal reaction or bone destruction. No aggressive osseous lesion. Normal sacrum and sacroiliac joints. No SI joint widening or erosive changes. Degenerative disc disease with disc height loss at L4-5 and L5-S1.  Articular cartilage and labrum Articular cartilage: High-grade partial-thickness cartilage loss of of the left hip. Partial-thickness cartilage loss of the right hip. Labrum: Grossly intact, but evaluation is limited by lack of intraarticular fluid. Joint or bursal effusion Joint effusion: Small right hip joint effusion. Small left hip joint effusion. No SI joint effusion. Bursae:  No bursa formation. Muscles and tendons Flexors: Mild muscle edema in the iliacus muscle bilaterally. Extensors:  Mild muscle edema in the right vastus lateralis muscle. Abductors: Normal. Adductors: Mild muscle edema in the right adductor magnus muscle. Gluteals: Muscle edema in the left superior gluteus maximus muscle. Hamstrings: Normal. Other findings Miscellaneous: No pelvic free fluid. No fluid collection or hematoma. No inguinal lymphadenopathy. No inguinal hernia. IMPRESSION: 1. No hip fracture, dislocation or avascular necrosis. 2. Moderate osteoarthritis of the left hip. Mild osteoarthritis of the right hip. 3. Mild muscle strain involving the the right vastus lateralis muscle, right adductor magnus muscle and left superior gluteus maximus muscle. Electronically Signed   By: Kathreen Devoid   On: 12/22/2016 17:39   Dg Knee Complete 4 Views Right  Result Date: 12/28/2016 CLINICAL DATA:  Right knee pain and history of cellulitis and early osteomyelitis. Wound over end of right below-knee amputation. EXAM: RIGHT KNEE - COMPLETE 4+ VIEW COMPARISON:  12/22/2016 FINDINGS: Evidence of patient's below-knee amputation unchanged. Diffuse osteopenia. Tricompartmental osteoarthritic change. No definite evidence of bone destruction over the tibial stump to suggest osteomyelitis. Moderate calcified atherosclerotic plaque over the femoral popliteal arteries. IMPRESSION: No definite evidence of osteomyelitis. Below-knee amputation unchanged. Mild tricompartmental osteoarthritis. Atherosclerotic disease. Electronically Signed   By: Marin Olp  M.D.   On: 12/28/2016 18:48   Dg Knee Complete 4 Views Right  Result Date: 12/22/2016 CLINICAL DATA:  Patient complains of 2 weeks of swelling to right stump, denies any trauma and does not wear prosthetic. Also complains of left hip pain with with movement. States that he is suppose to be scheduled for appointment at wound clinic in future. No fever, NAD. Noticeable green circular scab on bottom of right stump. EXAM: RIGHT KNEE - COMPLETE 4+ VIEW COMPARISON:  None. FINDINGS: Status post below-the-knee amputation. The amputated margins of the remaining tibia and fibula are well-defined. There is no bone resorption to suggest osteomyelitis. No fracture. Bones are demineralized. There is mild medial and lateral femorotibial joint space compartment narrowing. No knee joint effusion. Dense vascular calcifications are noted along the distal superficial femoral artery and popliteal artery. Soft tissue edema is seen overlying the stump. No soft tissue air. A small metallic foreign body projects within the posterior subcutaneous soft tissues at the level of the knee. IMPRESSION: 1. No fracture.  No evidence of osteomyelitis. 2. Soft tissue swelling overlying the amputated distal tibia and fibula. Electronically Signed   By: Lajean Manes M.D.   On: 12/22/2016 13:19   Dg Hip Unilat With Pelvis 2-3 Views Left  Result Date: 12/22/2016 CLINICAL DATA:  Patient complains of 2 weeks of swelling to right stump, denies any trauma and does not wear prosthetic. Also complains of left hip pain with with movement. States that he is suppose to be scheduled for appointment at wound clinic in future. No fever, NAD. Noticeable green circular scab on bottom of right stump. EXAM: DG HIP (WITH OR WITHOUT PELVIS) 2-3V LEFT COMPARISON:  None. FINDINGS: No fracture.  No bone lesion. The hip joint is normally aligned. There is mild concentric hip joint space narrowing. Small marginal osteophytes project from the inferior femoral head. Bones  are demineralized. There is dense calcification along the iliac and femoral vessels. IMPRESSION: 1. No fracture or bone lesion. 2. Mild hip joint arthropathic change. Electronically Signed   By: Lajean Manes M.D.   On: 12/22/2016 13:21    Lab Data:  CBC:  Recent Labs Lab 12/28/16 2000 12/30/16 2130 12/31/16 0700 01/01/17 0536 01/02/17 0330  WBC 6.2 8.0 7.7 8.1 6.6  NEUTROABS 4.7  --   --   --   --   HGB 11.8* 12.1* 10.8* 10.5* 10.4*  HCT 40.4 40.9 37.3* 35.7* 36.0*  MCV 99.3 97.8 97.6 97.3 98.4  PLT 249 246 256 255 119   Basic Metabolic Panel:  Recent Labs Lab 12/28/16 2000 12/31/16 0656 12/31/16 0700 01/01/17 0536 01/02/17 0330  NA 138  --  137 137 138  K 4.7  --  4.8 5.1 4.2  CL 96*  --  98* 92* 98*  CO2 29  --  26 29 28   GLUCOSE 93  --  88 111* 92  BUN 41*  --  41* 58* 26*  CREATININE 6.79*  --  7.54* 9.04* 5.51*  CALCIUM 9.0  --  9.0 8.5* 8.9  PHOS  --  5.9*  --   --   --    GFR: Estimated Creatinine Clearance: 9.2 mL/min (A) (by C-G formula based on SCr of 5.51 mg/dL (H)). Liver Function Tests:  Recent Labs Lab 12/31/16 0700  AST 13*  ALT 13*  ALKPHOS 66  BILITOT 0.5  PROT 6.2*  ALBUMIN 2.7*   No results for input(s): LIPASE, AMYLASE in the last 168 hours. No results for input(s): AMMONIA in the last 168 hours. Coagulation Profile:  Recent Labs Lab 12/31/16 0700  INR 1.10   Cardiac Enzymes: No results for input(s): CKTOTAL, CKMB, CKMBINDEX, TROPONINI in the last 168 hours. BNP (last 3 results) No results for input(s): PROBNP in the last 8760 hours. HbA1C: No results for input(s): HGBA1C in the last 72 hours. CBG:  Recent Labs Lab 01/01/17 1724 01/01/17 2049 01/02/17 0622 01/02/17 1159  GLUCAP 151* 128* 96 122*   Lipid Profile: No results for input(s): CHOL, HDL, LDLCALC, TRIG, CHOLHDL, LDLDIRECT in the last 72 hours. Thyroid Function Tests: No results for input(s): TSH, T4TOTAL, FREET4, T3FREE, THYROIDAB in the last 72  hours. Anemia Panel: No results for input(s): VITAMINB12, FOLATE, FERRITIN, TIBC, IRON, RETICCTPCT in the last 72 hours. Urine analysis:    Component Value Date/Time   COLORURINE YELLOW 06/22/2014 [place] 06/22/2014 0531   LABSPEC 1.015 06/22/2014 0531   PHURINE 6.0 06/22/2014 0531   GLUCOSEU 100 (A) 06/22/2014 0531   HGBUR SMALL (A) 06/22/2014 0531   HGBUR trace-lysed 06/03/2008 0855   BILIRUBINUR NEGATIVE 06/22/2014 0531   KETONESUR TRACE (A) 06/22/2014 0531   PROTEINUR 100 (A) 06/22/2014 0531   UROBILINOGEN 0.2 06/22/2014 0531   NITRITE NEGATIVE 06/22/2014 0531   LEUKOCYTESUR NEGATIVE 06/22/2014 0531     Ripudeep Rai M.D. [facility] 01/02/2017, 3:13 PM  Pager: [phone] Between 7am to 7pm - call Pager - [phone]  After 7pm go to [email or web address] - password [email or web address]  Call night coverage person covering after 7pm

## 2017-01-02 NOTE — Evaluation (Signed)
Physical Therapy Evaluation Patient Details Name: Randy [name] MRN: [id number] DOB: 07-Nov-1931 Today's Date: 01/02/2017   History of Present Illness  83 yo with extensive PMH, including ESRD (HDMWF), PVD, CAD, Arthritis, CVA , L AKA, R BKA who underwent revision to R AKA 01/01/17.   Clinical Impression  Pt is POD 1 following the above procedure. Prior to admission, pt was functioning at [facility] level performing transfers via sliding board and required assistance from his wife and paid CG for all ADLs, IADLs, and transportation needs. Pt is currently total A for mobility from supine to sit and for transfers from bed to chair. Pt will benefit from CIR for continued therapy in order to maximize his functional outcomes and assist with WC level mobility. Pt continues to benefit from acute services prior to DC to next venue.     Follow Up Recommendations CIR    Equipment Recommendations  None recommended by PT    Recommendations for Other Services Rehab consult     Precautions / Restrictions Precautions Precautions: Fall Precaution Comments: at risk for skin breakdown Restrictions Weight Bearing Restrictions: No      Mobility  Bed Mobility Overal bed mobility: Needs Assistance Bed Mobility: Rolling;Sidelying to Sit Rolling: +2 for physical assistance;Mod assist Sidelying to sit: Max assist;+2 for physical assistance       General bed mobility comments: Heavy use of rails. very painful  Transfers Overall transfer level: Needs assistance   Transfers: Comptroller transfers: Max assist;+2 physical assistance   General transfer comment: Pt assisting by helping to scoot laterally but requried Total A to scoot back into chair. At baseline, pt uses sliding board  Ambulation/Gait                Stairs            Wheelchair Mobility    Modified Rankin (Stroke Patients Only)       Balance Overall balance assessment: Needs  assistance   Sitting balance-Leahy Scale: Poor                                       Pertinent Vitals/Pain Pain Assessment: 0-10 Pain Score: 10-Worst pain ever Pain Location: R LE Pain Descriptors / Indicators: Aching;Grimacing;Guarding;Discomfort;Moaning Pain Intervention(s): Limited activity within patient's tolerance;Monitored during session;Premedicated before session;Repositioned    Home Living Family/patient expects to be discharged to:: Private residence Living Arrangements: Spouse/significant other Available Help at Discharge: Family;Available 24 hours/day Type of Home: House Home Access: Ramped entrance     Home Layout: One level Home Equipment: Transport planner;Wheelchair - [employer];Shower seat      Prior Function Level of Independence: Needs assistance   Gait / Transfers Assistance Needed: Transfers via sliding board with S from wife, has paid care giver who transports to dialysis  ADL's / Homemaking Assistance Needed: hads paid caregiver assistance helping with bathing, dressing and wife helps other times.   Comments: wife assists with showering, transfers via slideboard. PCG assists with others during day.      Hand Dominance   Dominant Hand: Right    Extremity/Trunk Assessment   Upper Extremity Assessment Upper Extremity Assessment: Defer to OT evaluation    Lower Extremity Assessment Lower Extremity Assessment: Generalized weakness    Cervical / Trunk Assessment Cervical / Trunk Assessment: Normal  Communication   Communication: No difficulties  Cognition Arousal/Alertness: Lethargic;Suspect due to  medications Behavior During Therapy: WFL for tasks assessed/performed Overall Cognitive Status: No family/caregiver present to determine baseline cognitive functioning                                        General Comments      Exercises     Assessment/Plan    PT Assessment Patient needs  continued PT services  PT Problem List Decreased strength;Decreased activity tolerance;Decreased balance;Decreased mobility;Pain       PT Treatment Interventions DME instruction;Functional mobility training;Therapeutic activities;Therapeutic exercise;Balance training;Wheelchair mobility training    PT Goals (Current goals can be found in the Care Plan section)  Acute Rehab PT Goals Patient Stated Goal: to get better and go home PT Goal Formulation: With patient Time For Goal Achievement: 01/09/17 Potential to Achieve Goals: Fair    Frequency Min 5X/week   Barriers to discharge        Co-evaluation PT/OT/SLP Co-Evaluation/Treatment: Yes Reason for Co-Treatment: Complexity of the patient's impairments (multi-system involvement) PT goals addressed during session: Mobility/safety with mobility         End of Session   Activity Tolerance: Patient limited by lethargy Patient left: in chair;with call bell/phone within reach Nurse Communication: Mobility status PT Visit Diagnosis: Muscle weakness (generalized) (M62.81);Pain Pain - Right/Left: Right Pain - part of body: Hip    Time: 9622-2979 PT Time Calculation (min) (ACUTE ONLY): 34 min   Charges:   PT Evaluation $PT Eval Moderate Complexity: 1 Procedure     PT G Codes:        Scheryl Marten PT, DPT  [phone]   Shanon Rosser 01/02/2017, 4:42 PM

## 2017-01-02 NOTE — Progress Notes (Signed)
S: CO pain in Rt stump O:BP 111/63 (BP Location: Right Arm)   Pulse 82   Temp 99 F (37.2 C) (Oral)   Resp 18   Ht 5\' 8"  (1.727 m)   Wt 64 kg (141 lb 1.5 oz)   SpO2 95%   BMI 21.45 kg/m   Intake/Output Summary (Last 24 hours) at 01/02/17 0858 Last data filed at 01/02/17 5852  Gross per 24 hour  Intake              480 ml  Output             3600 ml  Net            -3120 ml   Weight change:  DPO:EUMPN and alert CVS:RRR with occasional premature beat 1/6 systolic M Resp:Clear Abd:+BS NTND Ext: Rt AKA bandaged.  Lt AKA  Lt AVF + bruit NEURO:CNI Ox3 no asterixis   . acetaminophen  1,000 mg Oral TID  . amLODipine  5 mg Oral Daily  . atorvastatin  80 mg Oral QPC supper  . brimonidine  1 drop Both Eyes BID  . calcitRIOL  3 mcg Oral Q M,W,F-HD  . ceftAZIDime (FORTAZ) IVPB 2 gram/50 mL D5W (Pyxis)  2 g Intravenous Q M,W,F-2000  . cinacalcet  60 mg Oral QPC supper  . docusate sodium  100 mg Oral Daily  . feeding supplement  1 Container Oral Daily  . feeding supplement (NEPRO CARB STEADY)  237 mL Oral BID BM  . gabapentin  300 mg Oral QHS  . heparin  5,000 Units Subcutaneous Q8H  . isosorbide mononitrate  15 mg Oral QHS  . lisinopril  40 mg Oral Daily  . multivitamin  1 tablet Oral QHS  . pantoprazole  40 mg Oral Q1200  . sevelamer carbonate  2,400 mg Oral TID WC  . timolol  1 drop Both Eyes BID  . Vancomycin  750 mg Intravenous Q M,W,F-HD   No results found. BMET    Component Value Date/Time   NA 138 01/02/2017 0330   NA 136 02/22/2013 1643   K 4.2 01/02/2017 0330   K 3.6 02/22/2013 1643   CL 98 (L) 01/02/2017 0330   CL 103 02/22/2013 1643   CO2 28 01/02/2017 0330   CO2 24 02/22/2013 1643   GLUCOSE 92 01/02/2017 0330   GLUCOSE 87 02/22/2013 1643   BUN 26 (H) 01/02/2017 0330   BUN 48 (H) 02/22/2013 1643   CREATININE 5.51 (H) 01/02/2017 0330   CREATININE 6.02 (H) 02/22/2013 1643   CALCIUM 8.9 01/02/2017 0330   CALCIUM 8.9 02/22/2013 1643   CALCIUM 8.1 (L)  06/24/2012 1634   GFRNONAA 9 (L) 01/02/2017 0330   GFRNONAA 8 (L) 02/22/2013 1643   GFRAA 10 (L) 01/02/2017 0330   GFRAA 9 (L) 02/22/2013 1643   CBC    Component Value Date/Time   WBC 6.6 01/02/2017 0330   RBC 3.66 (L) 01/02/2017 0330   HGB 10.4 (L) 01/02/2017 0330   HGB 11.3 (L) 02/22/2013 1643   HCT 36.0 (L) 01/02/2017 0330   HCT 23.9 (L) 08/19/2016 1101   PLT 221 01/02/2017 0330   PLT 240 02/22/2013 1643   MCV 98.4 01/02/2017 0330   MCV 92 02/22/2013 1643   MCH 28.4 01/02/2017 0330   MCHC 28.9 (L) 01/02/2017 0330   RDW 20.1 (H) 01/02/2017 0330   RDW 15.3 (H) 02/22/2013 1643   LYMPHSABS 0.8 12/28/2016 2000   MONOABS 0.6 12/28/2016 2000   EOSABS 0.1  12/28/2016 2000   BASOSABS 0.0 12/28/2016 2000     Assessment: 1. SP Rt AKA 2. ESRD MWF 3. HTN 4. Anemia  Plan: 1. HD tomorrow 2. Suspect he will need NH   Kristell Wooding T

## 2017-01-02 NOTE — Progress Notes (Signed)
Received a call from Dr. Tana Coast requesting reassessment of pt for a potential inpt rehab admission. I met with daughter and pt at bedside. Daughter states doctor wants pt to stay for two weeks in hospital for rehab. I explained that I will pursue insurance approval once P.T. Completes their evaluation. I have placed call to PT. RN CM updated. [phone]

## 2017-01-02 NOTE — Progress Notes (Signed)
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    Clinical Impression   PTA, pt lived at home with wife, who assisted with ADL and mobility as needed. Pt states he has a Caregiver Academic librarian) who also assists with his care. Pt mobilizes at w/c level with use of sliding board.  Pt demonstrates a significant decline in his functional mobility and ADL status due to his dependence on his R BKA to mobilize. Pt states " this is a lot harder than I thought" and stated he was interested in rehab. Pt currently required Max A +2 with bed mobility and total A +2 with ant/post transfer to chair. Feel pt will benefit form CIR to return to PLOF to decrease burden of care and facilitate safe DC home with 24/7 S.     Follow Up Recommendations  CIR;Supervision/Assistance - 24 hour    Equipment Recommendations  3 in 1 bedside commode (drop arm) . TBA   Recommendations for Other Services Rehab consult     Precautions / Restrictions Precautions Precautions: Fall Precaution Comments: at risk for skin breakdown Restrictions Weight Bearing Restrictions: No      Mobility Bed Mobility Overal bed mobility: Needs Assistance Bed Mobility: Rolling;Sidelying to Sit Rolling: +2 for physical assistance;Mod assist Sidelying to sit: Max assist;+2 for physical assistance       General bed mobility comments: Heavy use of rails. very painful  Transfers Overall transfer level: Needs assistance   Transfers: Comptroller transfers: Max assist;+2 physical assistance   General transfer comment: Pt assisting by helping to scoot laterally but requried Total A to scoot back into chair. At baseline, pt uses sliding board    Balance Overall  balance assessment: Needs assistance   Sitting balance-Leahy Scale: Poor                                     ADL either performed or assessed with clinical judgement   ADL Overall ADL's : Needs assistance/impaired Eating/Feeding: Set up   Grooming: Set up   Upper Body Bathing: Set up;Sitting;Supervision/ safety Upper Body Bathing Details (indicate cue type and reason): in supported sitting Lower Body Bathing: Moderate assistance;Sitting/lateral leans;Bed level   Upper Body Dressing : Minimal assistance Upper Body Dressing Details (indicate cue type and reason): difficulty maintaining balance  Lower Body Dressing: Maximal assistance;Bed level (rolling)   Toilet Transfer: Total assistance;+2 for physical assistance (ant/post trnasfer to recliner - simulated)   Toileting- Clothing Manipulation and Hygiene: Maximal assistance       Functional mobility during ADLs: Maximal assistance;Total assistance;+2 for physical assistance (Max+2 for bed mobility/ant/post transfer) General ADL Comments: At baseline pt "does at much as he can do". Wife assists as needed     Vision Baseline Vision/History:  ("bad vision") Patient Visual Report: Blurring of vision       Perception     Praxis      Pertinent Vitals/Pain Pain Assessment: Faces Pain Score: 10-Worst pain ever Faces Pain Scale: Hurts whole lot Pain Location: R LE Pain Descriptors / Indicators: Aching;Grimacing;Guarding;Discomfort;Moaning Pain Intervention(s): Limited activity within patient's tolerance;Monitored during session;Premedicated before session;Repositioned     Hand Dominance Right  Extremity/Trunk Assessment Upper Extremity Assessment Upper Extremity Assessment: Generalized weakness   Lower Extremity Assessment Lower Extremity Assessment: Defer to PT evaluation   Cervical / Trunk Assessment Cervical / Trunk Assessment: Normal   Communication Communication Communication: No difficulties    Cognition Arousal/Alertness: Lethargic;Suspect due to medications Behavior During Therapy: [facility] for tasks assessed/performed Overall Cognitive Status: No family/caregiver present to determine baseline cognitive functioning                                     General Comments       Exercises     Shoulder Instructions      Home Living Family/patient expects to be discharged to:: Private residence Living Arrangements: Spouse/significant other Available Help at Discharge: Family;Available 24 hours/day Type of Home: House Home Access: Ramped entrance     Home Layout: One level     Bathroom Shower/Tub: Occupational psychologist: Standard Bathroom Accessibility: Yes How Accessible: Accessible via wheelchair Home Equipment: Transport planner;Wheelchair - [employer];Shower seat (need to confirm equipment info)          Prior Functioning/Environment Level of Independence: Needs assistance  Gait / Transfers Assistance Needed: Transfers via sliding board with S from wife, has paid care giver who transports to dialysis ADL's / Homemaking Assistance Needed: hads paid caregiver assistance helping with bathing, dressing and wife helps other times.  Communication / Swallowing Assistance Needed: no issues reported Comments: wife assists with showering, transfers via slideboard. PCG assists with others during day.         OT Problem List: Decreased strength;Decreased range of motion;Decreased activity tolerance;Impaired balance (sitting and/or standing);Decreased safety awareness;Decreased knowledge of use of DME or AE;Decreased knowledge of precautions;Pain      OT Treatment/Interventions: Self-care/ADL training;Therapeutic exercise;DME and/or AE instruction;Therapeutic activities;Patient/family education;Balance training    OT Goals(Current goals can be found in the care plan section) Acute Rehab OT Goals Patient Stated Goal: to get better and go  home OT Goal Formulation: With patient Time For Goal Achievement: 01/16/17 Potential to Achieve Goals: Good ADL Goals Pt Will Perform Upper Body Bathing: with set-up;sitting Pt Will Perform Lower Body Bathing: with min assist;sitting/lateral leans Pt Will Transfer to Toilet: with mod assist;with transfer board;bedside commode (drop arm) Pt Will Perform Toileting - Clothing Manipulation and hygiene: with min assist;sitting/lateral leans Pt/caregiver will Perform Home Exercise Program: Increased strength;Both right and left upper extremity;With theraband;With Supervision  OT Frequency: Min 2X/week   Barriers to D/C:            Co-evaluation PT/OT/SLP Co-Evaluation/Treatment: Yes Reason for Co-Treatment: Complexity of the patient's impairments (multi-system involvement);For patient/therapist safety;To address functional/ADL transfers   OT goals addressed during session: ADL's and self-care      End of Session Nurse Communication: Mobility status;Need for lift equipment (maximove with amputee sling)  Activity Tolerance: Patient tolerated treatment well Patient left: in chair;with call bell/phone within reach;with chair alarm set  OT Visit Diagnosis: Other abnormalities of gait and mobility (R26.89);Pain Pain - Right/Left: Right Pain - part of body: Leg                Time: 9371-6967 OT Time Calculation (min): 34 min Charges:  OT General Charges $OT Visit: 1 Procedure OT Evaluation $OT Eval Moderate Complexity: 1 Procedure G-Codes:     Heather Mckendree, OT/L  [phone] 01/02/2017  Jadd Gasior,HILLARY 01/02/2017, 12:47 PM

## 2017-01-02 NOTE — Progress Notes (Signed)
Orthopedic Tech Progress Note Patient Details:  Randy [name] 1932/08/05 [id number]  Patient ID: Randy [name], male   DOB: [birth year], 83 y.o.   MRN: [id number] [place]  [name] 01/02/2017, 11:14 AM

## 2017-01-02 NOTE — Progress Notes (Signed)
Rept received from Particia Nearing RN. Pt sitting in recliner with wife at bedside and supportive. Pt alert and oriented but forgetful. No s/sx distress and no c/o such. Will continue to montior.

## 2017-01-02 NOTE — Progress Notes (Signed)
I spoke with pt's daughter, [name], by phone. She does not feel pt needs additional rehab before d/c home. She states once his pain more tolerable, he has plenty of caregiver support at home, and wants [place] arranged only. I will alert RN CM. [phone]

## 2017-01-02 NOTE — Progress Notes (Addendum)
  Progress Note  VASCULAR SURGERY ASSESSMENT & PLAN:   POD 2 Right AKA.   Begin daily dressing changes.   Continue PTx. Will see how he does with PTx to determine if he can go home with HHPT vs CIR.   [name], MD, FACS Beeper [phone] Office: [phone]   01/02/2017 8:16 AM 2 Days Post-Op  Subjective:  Pt says he is feeling "so so"  Tm 99 VSS  Vitals:   01/01/17 2210 01/02/17 0636  BP: 105/67 111/63  Pulse: 79 82  Resp: 18 18  Temp: 98.4 F (36.9 C) 99 F (37.2 C)    Physical Exam: Incisions:  Clean and dry with staples in tact.  There is no ecchymosis or erythema Extremities:  Able to raise leg in the air for dressing change.  CBC    Component Value Date/Time   WBC 6.6 01/02/2017 0330   RBC 3.66 (L) 01/02/2017 0330   HGB 10.4 (L) 01/02/2017 0330   HGB 11.3 (L) 02/22/2013 1643   HCT 36.0 (L) 01/02/2017 0330   HCT 23.9 (L) 08/19/2016 1101   PLT 221 01/02/2017 0330   PLT 240 02/22/2013 1643   MCV 98.4 01/02/2017 0330   MCV 92 02/22/2013 1643   MCH 28.4 01/02/2017 0330   MCHC 28.9 (L) 01/02/2017 0330   RDW 20.1 (H) 01/02/2017 0330   RDW 15.3 (H) 02/22/2013 1643   LYMPHSABS 0.8 12/28/2016 2000   MONOABS 0.6 12/28/2016 2000   EOSABS 0.1 12/28/2016 2000   BASOSABS 0.0 12/28/2016 2000    BMET    Component Value Date/Time   NA 138 01/02/2017 0330   NA 136 02/22/2013 1643   K 4.2 01/02/2017 0330   K 3.6 02/22/2013 1643   CL 98 (L) 01/02/2017 0330   CL 103 02/22/2013 1643   CO2 28 01/02/2017 0330   CO2 24 02/22/2013 1643   GLUCOSE 92 01/02/2017 0330   GLUCOSE 87 02/22/2013 1643   BUN 26 (H) 01/02/2017 0330   BUN 48 (H) 02/22/2013 1643   CREATININE 5.51 (H) 01/02/2017 0330   CREATININE 6.02 (H) 02/22/2013 1643   CALCIUM 8.9 01/02/2017 0330   CALCIUM 8.9 02/22/2013 1643   CALCIUM 8.1 (L) 06/24/2012 1634   GFRNONAA 9 (L) 01/02/2017 0330   GFRNONAA 8 (L) 02/22/2013 1643   GFRAA 10 (L) 01/02/2017 0330   GFRAA 9 (L) 02/22/2013 1643     INR    Component Value Date/Time   INR 1.10 12/31/2016 0700     Intake/Output Summary (Last 24 hours) at 01/02/17 0816 Last data filed at 01/02/17 7741  Gross per 24 hour  Intake              480 ml  Output             3600 ml  Net            -3120 ml     Assessment/Plan:  83 y.o. male is s/p right above knee amputation  2 Days Post-Op  -stump is viable -pt did well with dressing change. -will order retention sock from [facility] -PT getting ready to work with pt -disposition to be determined.    Leontine Locket, PA-C [facility] [phone] 01/02/2017 8:16 AM

## 2017-01-02 NOTE — Progress Notes (Signed)
I have faxed clinicals for insurance approval for a possible inpt rehab admission. [phone]

## 2017-01-03 LAB — GLUCOSE, CAPILLARY
GLUCOSE-CAPILLARY: 91 mg/dL (ref 65–99)
Glucose-Capillary: 104 mg/dL — ABNORMAL HIGH (ref 65–99)
Glucose-Capillary: 129 mg/dL — ABNORMAL HIGH (ref 65–99)
Glucose-Capillary: 145 mg/dL — ABNORMAL HIGH (ref 65–99)

## 2017-01-03 MED ORDER — ACETAMINOPHEN 500 MG PO TABS: 1000.0000 mg | ORAL_TABLET | Freq: Three times a day (TID) | ORAL | 0 refills | Status: AC

## 2017-01-03 MED ORDER — CALCITRIOL 0.5 MCG PO CAPS
ORAL_CAPSULE | ORAL | Status: AC
  Filled 2017-01-03: qty 6

## 2017-01-03 MED ORDER — OXYCODONE-ACETAMINOPHEN 5-325 MG PO TABS: 1.0000 | ORAL_TABLET | ORAL | 0 refills | Status: DC | PRN

## 2017-01-03 MED ORDER — VANCOMYCIN HCL IN DEXTROSE 750-5 MG/150ML-% IV SOLN
INTRAVENOUS | Status: AC
  Filled 2017-01-03: qty 150

## 2017-01-03 MED ORDER — GABAPENTIN 300 MG PO CAPS: 300.0000 mg | ORAL_CAPSULE | Freq: Every day | ORAL | 0 refills | Status: AC

## 2017-01-03 MED ORDER — POLYETHYLENE GLYCOL 3350 17 G PO PACK: 17.0000 g | PACK | Freq: Every day | ORAL | 0 refills | Status: DC | PRN

## 2017-01-03 NOTE — Progress Notes (Signed)
[facility] Progress Note  Subjective  - POD #3  Mild pain right AKA  Objective Vitals:   01/02/17 2048 01/03/17 0440  BP: 117/69 (!) 109/52  Pulse: 72 64  Resp: 18 16  Temp: 97.5 F (36.4 C) 99.2 F (37.3 C)    Intake/Output Summary (Last 24 hours) at 01/03/17 0908 Last data filed at 01/03/17 0339  Gross per 24 hour  Intake             1190 ml  Output                0 ml  Net             1190 ml    Right AKA staples intact with viable skin edges.  Assessment/Planning: 83 y.o. male is s/p: right AKA 3 Days Post-Op   Right AKA healing well. Ok to d/c home from vascular standpoint with HH versus CIR pending insurance approval. F/u in 4 weeks for staple removal.   Randy [name] 01/03/2017 9:08 AM --  Laboratory CBC    Component Value Date/Time   WBC 6.6 01/02/2017 0330   HGB 10.4 (L) 01/02/2017 0330   HGB 11.3 (L) 02/22/2013 1643   HCT 36.0 (L) 01/02/2017 0330   HCT 23.9 (L) 08/19/2016 1101   PLT 221 01/02/2017 0330   PLT 240 02/22/2013 1643    BMET    Component Value Date/Time   NA 138 01/02/2017 0330   NA 136 02/22/2013 1643   K 4.2 01/02/2017 0330   K 3.6 02/22/2013 1643   CL 98 (L) 01/02/2017 0330   CL 103 02/22/2013 1643   CO2 28 01/02/2017 0330   CO2 24 02/22/2013 1643   GLUCOSE 92 01/02/2017 0330   GLUCOSE 87 02/22/2013 1643   BUN 26 (H) 01/02/2017 0330   BUN 48 (H) 02/22/2013 1643   CREATININE 5.51 (H) 01/02/2017 0330   CREATININE 6.02 (H) 02/22/2013 1643   CALCIUM 8.9 01/02/2017 0330   CALCIUM 8.9 02/22/2013 1643   CALCIUM 8.1 (L) 06/24/2012 1634   GFRNONAA 9 (L) 01/02/2017 0330   GFRNONAA 8 (L) 02/22/2013 1643   GFRAA 10 (L) 01/02/2017 0330   GFRAA 9 (L) 02/22/2013 1643    COAG Lab Results  Component Value Date   INR 1.10 12/31/2016   INR 1.05 12/22/2016   INR 1.12 08/19/2016   No results found for: PTT  Antibiotics Anti-infectives    Start     Dose/Rate Route Frequency Ordered Stop   01/01/17 2000   cefTAZidime (FORTAZ) 2 g in dextrose 5 % 50 mL IVPB     2 g 100 mL/hr over 30 Minutes Intravenous Every M-W-F (2000) 12/31/16 0020     01/01/17 1200  vancomycin (VANCOCIN) IVPB 750 mg/150 ml premix     750 mg 150 mL/hr over 60 Minutes Intravenous Every M-W-F (Hemodialysis) 12/31/16 0020     12/31/16 2200  cefUROXime (ZINACEF) 1.5 g in dextrose 5 % 50 mL IVPB  Status:  Discontinued     1.5 g 100 mL/hr over 30 Minutes Intravenous Every 12 hours 12/31/16 1201 12/31/16 1229   12/31/16 0903  cefUROXime (ZINACEF) 1.5 g in dextrose 5 % 50 mL IVPB     1.5 g 100 mL/hr over 30 Minutes Intravenous 30 min pre-op 12/31/16 0903 12/31/16 1032   12/31/16 0902  dextrose 5 % with cefUROXime (ZINACEF) ADS Med    Comments:  [name]   : cabinet override  12/31/16 0902 12/31/16 [place], PA-C [facility] Office: [phone] Pager: [phone] 01/03/2017 9:08 AM

## 2017-01-03 NOTE — Care Management Important Message (Signed)
Important Message  Patient Details  Name: KALYB PEMBLE MRN: [id number] Date of Birth: 1931/10/05   Medicare Important Message Given:  Yes    Orbie Pyo 01/03/2017, 2:42 PM

## 2017-01-03 NOTE — Progress Notes (Signed)
S: Still CO pain in Rt stump O:BP (!) 109/52 (BP Location: Right Arm)   Pulse 64   Temp 99.2 F (37.3 C) (Oral)   Resp 16   Ht 5\' 8"  (1.727 m)   Wt 64 kg (141 lb 1.5 oz)   SpO2 94%   BMI 21.45 kg/m   Intake/Output Summary (Last 24 hours) at 01/03/17 0937 Last data filed at 01/03/17 0339  Gross per 24 hour  Intake              830 ml  Output                0 ml  Net              830 ml   Weight change:  NTI:RWERX and alert CVS:RRR with occasional premature beat 1/6 systolic M Resp: few crackles in bases Abd:+BS NTND Ext: Rt AKA bandaged.  Lt AKA  Lt AVF + bruit NEURO:CNI Ox3 no asterixis   . acetaminophen  1,000 mg Oral TID  . amLODipine  5 mg Oral Daily  . atorvastatin  80 mg Oral QPC supper  . brimonidine  1 drop Both Eyes BID  . calcitRIOL  3 mcg Oral Q M,W,F-HD  . ceftAZIDime (FORTAZ) IVPB 2 gram/50 mL D5W (Pyxis)  2 g Intravenous Q M,W,F-2000  . cinacalcet  60 mg Oral QPC supper  . docusate sodium  100 mg Oral Daily  . feeding supplement  1 Container Oral Daily  . feeding supplement (NEPRO CARB STEADY)  237 mL Oral BID BM  . gabapentin  300 mg Oral BID  . heparin  5,000 Units Subcutaneous Q8H  . isosorbide mononitrate  15 mg Oral QHS  . lisinopril  40 mg Oral Daily  . multivitamin  1 tablet Oral QHS  . pantoprazole  40 mg Oral Q1200  . sevelamer carbonate  2,400 mg Oral TID WC  . timolol  1 drop Both Eyes BID  . Vancomycin  750 mg Intravenous Q M,W,F-HD   No results found. BMET    Component Value Date/Time   NA 138 01/02/2017 0330   NA 136 02/22/2013 1643   K 4.2 01/02/2017 0330   K 3.6 02/22/2013 1643   CL 98 (L) 01/02/2017 0330   CL 103 02/22/2013 1643   CO2 28 01/02/2017 0330   CO2 24 02/22/2013 1643   GLUCOSE 92 01/02/2017 0330   GLUCOSE 87 02/22/2013 1643   BUN 26 (H) 01/02/2017 0330   BUN 48 (H) 02/22/2013 1643   CREATININE 5.51 (H) 01/02/2017 0330   CREATININE 6.02 (H) 02/22/2013 1643   CALCIUM 8.9 01/02/2017 0330   CALCIUM 8.9 02/22/2013  1643   CALCIUM 8.1 (L) 06/24/2012 1634   GFRNONAA 9 (L) 01/02/2017 0330   GFRNONAA 8 (L) 02/22/2013 1643   GFRAA 10 (L) 01/02/2017 0330   GFRAA 9 (L) 02/22/2013 1643   CBC    Component Value Date/Time   WBC 6.6 01/02/2017 0330   RBC 3.66 (L) 01/02/2017 0330   HGB 10.4 (L) 01/02/2017 0330   HGB 11.3 (L) 02/22/2013 1643   HCT 36.0 (L) 01/02/2017 0330   HCT 23.9 (L) 08/19/2016 1101   PLT 221 01/02/2017 0330   PLT 240 02/22/2013 1643   MCV 98.4 01/02/2017 0330   MCV 92 02/22/2013 1643   MCH 28.4 01/02/2017 0330   MCHC 28.9 (L) 01/02/2017 0330   RDW 20.1 (H) 01/02/2017 0330   RDW 15.3 (H) 02/22/2013 1643   LYMPHSABS 0.8 12/28/2016  2000   MONOABS 0.6 12/28/2016 2000   EOSABS 0.1 12/28/2016 2000   BASOSABS 0.0 12/28/2016 2000     Assessment: 1. SP Rt AKA 2. ESRD MWF 3. HTN 4. Anemia  Plan: 1. HD today 2. Note plans for inpt rehab 3. Hold amlodipine for SBP < 120   Adaysha Dubinsky T

## 2017-01-03 NOTE — Progress Notes (Signed)
Pt daughter called and states that they will not be picking him up for discharge today. Pt daughter states he "paid for today and is too weak after dialysis and in too much pain to go home tonight. Pt daughter states someone will come get him in am. MD informed.

## 2017-01-03 NOTE — Progress Notes (Signed)
Pt returned from HD at 1730. dtr in room. Reported from HD RN that they were only able to take off 500cc of fluid due to low sustained BP. Pt return and bp checked at 121/61. MD informed.

## 2017-01-03 NOTE — Progress Notes (Signed)
I met with pt, as well as spoke with daughter and wife by phone. They do not feel pt needs rehab after his BKA revised to AKA. Pain is limiting his transfer abilities, but they have caregiver support at home as they did prior to admission. They request [facility] therapy follow up at home. We will sign off and I have alerted RN CM and Dr. Tana Coast. [phone]

## 2017-01-03 NOTE — Discharge Summary (Addendum)
Physician Discharge Summary   Patient ID: Randy [name] MRN: [id number] DOB/AGE: June 02, 1932 83 y.o.  Admit date: 12/30/2016 Discharge date: 01/03/2017  Primary Care Physician:  [name], MD  Discharge Diagnoses:    . Osteomyelitis (HCC)Status post right AKA postop day #3 . End stage renal disease, has been peritoneal, now hemodialysis . Anemia of chronic disease . AF (paroxysmal atrial fibrillation) ([place]) . Benign essential HTN . Chronic combined systolic and diastolic CHF (congestive heart failure) ([place])   Consults:   Vascular surgery Nephrology  Recommendations for Outpatient Follow-up:  1. Home health PT OT, home health aide, RN to be arranged by the case management 2. Please repeat CBC/BMET at next visit   DIET: Renal diet    Allergies:  No Known Allergies   DISCHARGE MEDICATIONS: Current Discharge Medication List    START taking these medications   Details  acetaminophen (TYLENOL) 500 MG tablet Take 2 tablets (1,000 mg total) by mouth 3 (three) times daily. X 4 days Qty: 30 tablet, Refills: 0    polyethylene glycol (MIRALAX / GLYCOLAX) packet Take 17 g by mouth daily as needed for mild constipation. Qty: 30 each, Refills: 0      CONTINUE these medications which have CHANGED   Details  gabapentin (NEURONTIN) 300 MG capsule Take 1 capsule (300 mg total) by mouth at bedtime. Qty: 60 capsule, Refills: 0    oxyCODONE-acetaminophen (PERCOCET/ROXICET) 5-325 MG tablet Take 1-2 tablets by mouth every 4 (four) hours as needed. Qty: 15 tablet, Refills: 0      CONTINUE these medications which have NOT CHANGED   Details  amLODipine (NORVASC) 5 MG tablet Take 5 mg by mouth daily.    atorvastatin (LIPITOR) 80 MG tablet Take 1 tablet (80 mg total) by mouth daily. Qty: 90 tablet, Refills: 3    brimonidine (ALPHAGAN) 0.2 % ophthalmic solution Place 1 drop into both eyes 2 (two) times daily.    cinacalcet (SENSIPAR) 60 MG tablet Take 60 mg by mouth daily  after supper.     diclofenac sodium (VOLTAREN) 1 % GEL Apply 1 application topically 3 (three) times daily as needed (pain).  Refills: 98    docusate sodium (COLACE) 100 MG capsule Take 2 capsules (200 mg total) by mouth daily. Qty: 10 capsule, Refills: 0    feeding supplement (BOOST / RESOURCE BREEZE) LIQD Take 1 Container by mouth 4 (four) times daily -  with meals and at bedtime. Qty: 14 Container, Refills: 0    hydrALAZINE (APRESOLINE) 50 MG tablet Take 50 mg by mouth 2 (two) times daily as needed (high blood pressure >160/80).     isosorbide mononitrate (IMDUR) 30 MG 24 hr tablet TAKE 1/2 TABLET BY MOUTH AT BEDTIME. Qty: 45 tablet, Refills: 1    ketoconazole (NIZORAL) 2 % cream Apply 1 application topically daily as needed for irritation (itching).  Refills: 0    lidocaine-prilocaine (EMLA) cream Apply 1 application topically every Monday, Wednesday, and Friday. For dialysis Refills: 10    lisinopril (PRINIVIL,ZESTRIL) 40 MG tablet Take 40 mg by mouth daily. Refills: 2    nitroGLYCERIN (NITROSTAT) 0.4 MG SL tablet Place 1 tablet (0.4 mg total) under the tongue every 5 (five) minutes as needed for chest pain. Qty: 30 tablet, Refills: 12    pantoprazole (PROTONIX) 40 MG tablet Take 1 tablet (40 mg total) by mouth daily at 12 noon.    sevelamer carbonate (RENVELA) 800 MG tablet Take 1,600-2,400 mg by mouth See admin instructions. Take 3 tablets (2400 mg)  with meals and 2 tablets (1600 mg) with snacks    timolol (BETIMOL) 0.5 % ophthalmic solution Place 1 drop into both eyes 2 (two) times daily.      STOP taking these medications     cefTAZidime 2 g in dextrose 5 % 50 mL      HYDROcodone-acetaminophen (NORCO/VICODIN) 5-325 MG tablet      Vancomycin (VANCOCIN) 750-5 MG/150ML-% SOLN          Brief H and P: For complete details please refer to admission H and P, but in brief 83 y.o.malewith a past medical history significant for ESRD on HD MWF, CAD, HTN, NICM, PVD s/p  L AKA and right BKA, and Afib not on AC due to hx GIBwho presents with leg pain. 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  For past few weeks he developed new pain in his right BKA stump with new swelling. He was admitted here for 2 days about a week ago, MRI showed probably early osteomyelitis and cellulitis. It was discussed with ID on the phone who recommended IVvancomycin and Tressie Ellis, which he has been getting with HD. He was seen during that hospitalization by Dr. Donnetta Hutching who noted patient had been seen for similar symptoms back in Nov 2017 and recommended possible AKA which was planned for near future. But since pain was unbearable, he came to the ED.  Patient was admitted to hospitalist service. He underwent right AKA on 4/10  Hospital Course:  Right BKA stump with ischemic pain, Osteomyelitis? - Vascular surgery was consulted, patient underwent right AKA on 4/10. -  patient was placed on vancomycin and Fortaz empirically for osteomyelitis, will not need any antibiotics at discharge - Continue  Post op care. Prn IV dialaudid q3h for pain.  - Vascular surgery recommending inpatient rehabilitation, however patient and family declined CIR. They requested home health PT OT which was arranged by case management.  - Increase Neurontin to 300 mg twice a day - Patient is cleared for discharge by vascular surgery, recommended staple removal in 4 weeks  End stage renal disease,on HD ( M,W,F) - Sees Dr Florene Glen.  -continue sensipar and renvela -Nephrology consulted, hemodialysis per his schedule   Anemia of chronic disease - Hemoglobin currently stable   Chronic combined systolic and diastolic CHF (congestive heart failure) (HCC) euvolemic. volume management with HD Continue lisinopril, imdur and hydralazine. Continue statin  Essential HTN on amlodipine, ACEi, imdur  and hydralazine  AF (paroxysmal atrial fibrillation) (HCC) Not on anticoagulation due to history ofGI bleed.   Day of Discharge BP (!) 109/52 (BP Location: Right Arm)   Pulse 64   Temp 99.2 F (37.3 C) (Oral)   Resp 16   Ht 5\' 8"  (1.727 m)   Wt 64 kg (141 lb 1.5 oz)   SpO2 94%   BMI 21.45 kg/m   Physical Exam: General: Alert and awake oriented x3 not in any acute distress. HEENT: anicteric sclera, pupils reactive to light and accommodation CVS: S1-S2 clear no murmur rubs or gallops Chest: clear to auscultation bilaterally, no wheezing rales or rhonchi Abdomen: soft nontender, nondistended, normal bowel sounds Extremities: Bilateral AKA    The results of significant diagnostics from this hospitalization (including imaging, microbiology, ancillary and laboratory) are listed below for reference.    LAB RESULTS: Basic Metabolic Panel:  Recent Labs Lab 12/31/16 0656  01/01/17 0536 01/02/17  0330  NA  --   < > 137 138  K  --   < > 5.1 4.2  CL  --   < > 92* 98*  CO2  --   < > 29 28  GLUCOSE  --   < > 111* 92  BUN  --   < > 58* 26*  CREATININE  --   < > 9.04* 5.51*  CALCIUM  --   < > 8.5* 8.9  PHOS 5.9*  --   --   --   < > = values in this interval not displayed. Liver Function Tests:  Recent Labs Lab 12/31/16 0700  AST 13*  ALT 13*  ALKPHOS 66  BILITOT 0.5  PROT 6.2*  ALBUMIN 2.7*   No results for input(s): LIPASE, AMYLASE in the last 168 hours. No results for input(s): AMMONIA in the last 168 hours. CBC:  Recent Labs Lab 12/28/16 2000  01/01/17 0536 01/02/17 0330  WBC 6.2  < > 8.1 6.6  NEUTROABS 4.7  --   --   --   HGB 11.8*  < > 10.5* 10.4*  HCT 40.4  < > 35.7* 36.0*  MCV 99.3  < > 97.3 98.4  PLT 249  < > 255 221  < > = values in this interval not displayed. Cardiac Enzymes: No results for input(s): CKTOTAL, CKMB, CKMBINDEX, TROPONINI in the last 168 hours. BNP: Invalid input(s): POCBNP CBG:  Recent Labs Lab 01/03/17 0632  01/03/17 1132  GLUCAP 91 104*    Significant Diagnostic Studies:  No results found.  2D ECHO:   Disposition and Follow-up: Discharge Instructions    Diet - low sodium heart healthy    Complete by:  As directed    Increase activity slowly    Complete by:  As directed        DISPOSITION: Home with home health   [place]    [name], MD Follow up in 4 week(s).   Specialties:  Vascular Surgery, Cardiology Why:  Our office will call you to arrange an appointment  Contact information: [place] [address] [phone]        [name], MD. Schedule an appointment as soon as possible for a visit in 2 week(s).   Specialty:  Internal Medicine Contact information: [place] [address] [phone]            Time spent on Discharge: 25 minutes    Signed:   Estill Cotta M.D. [facility] 01/03/2017, 12:48 PM Pager: [phone]   Addendum: Patient was not discharged on 4/13 as he went for dialysis in the late afternoon shift. Family declined inpatient rehabilitation disposition.   Estill Cotta M.D. [facility] 01/05/2017, 1:45 PM  Pager: [phone]

## 2017-01-03 NOTE — Care Management Note (Signed)
Case Management Note  Patient Details  Name: AEDEN MATRANGA MRN: [id number] Date of Birth: 11/18/1931  Subjective/Objective:                    Action/Plan: Patient's wife and daughter have stated that patient has assistance at home and will be ok going home. CM has contacted [facility], [facility] Liaison and informed her that services need to resume.    Expected Discharge Date:  01/03/17               Expected Discharge Plan:  [place]  In-House Referral:  NA  Discharge planning Services  CM Consult  Post Acute Care Choice:  Home Health, Resumption of Svcs/PTA Provider Choice offered to:  Spouse, Adult Children, Patient  DME Arranged:  N/A DME Agency:     HH Arranged:  PT, OT HH Agency:  [facility]  Status of Service:  Completed, signed off  If discussed at [place] of Stay Meetings, dates discussed:    Additional Comments:  Ninfa Meeker, RN 01/03/2017, 3:06 PM

## 2017-01-04 LAB — GLUCOSE, CAPILLARY: Glucose-Capillary: 103 mg/dL — ABNORMAL HIGH (ref 65–99)

## 2017-01-04 MED ORDER — OXYCODONE-ACETAMINOPHEN 5-325 MG PO TABS: 1.0000 | ORAL_TABLET | ORAL | Status: DC | PRN

## 2017-01-04 MED ORDER — OXYCODONE-ACETAMINOPHEN 5-325 MG PO TABS: 1.0000 | ORAL_TABLET | ORAL | 0 refills | Status: AC | PRN

## 2017-01-04 MED ORDER — POLYETHYLENE GLYCOL 3350 17 G PO PACK: 17.0000 g | PACK | Freq: Every day | ORAL | 0 refills | Status: AC | PRN

## 2017-01-04 MED ORDER — GABAPENTIN 300 MG PO CAPS: 300.0000 mg | ORAL_CAPSULE | Freq: Every day | ORAL | Status: DC

## 2017-01-04 NOTE — Progress Notes (Signed)
S: Somnolent after receiving pain meds O:BP (!) 107/52 (BP Location: Right Arm)   Pulse 82   Temp 98.6 F (37 C) (Oral)   Resp 19   Ht 5\' 8"  (1.727 m)   Wt 63 kg (138 lb 14.2 oz)   SpO2 93%   BMI 21.12 kg/m   Intake/Output Summary (Last 24 hours) at 01/04/17 0815 Last data filed at 01/03/17 2011  Gross per 24 hour  Intake              890 ml  Output                1 ml  Net              889 ml   Weight change:  GMW:NUUVO and alert CVS:RRR with occasional premature beat 1/6 systolic M Resp: few crackles in bases Abd:+BS NTND Ext: Rt AKA bandaged.  Lt AKA  Lt AVF + bruit NEURO: Somnolent and difficult to arouse.  Can follow simple commands   . acetaminophen  1,000 mg Oral TID  . atorvastatin  80 mg Oral QPC supper  . brimonidine  1 drop Both Eyes BID  . calcitRIOL  3 mcg Oral Q M,W,F-HD  . ceftAZIDime (FORTAZ) IVPB 2 gram/50 mL D5W ([place])  2 g Intravenous Q M,W,F-2000  . cinacalcet  60 mg Oral QPC supper  . docusate sodium  100 mg Oral Daily  . feeding supplement  1 Container Oral Daily  . feeding supplement (NEPRO CARB STEADY)  237 mL Oral BID BM  . gabapentin  300 mg Oral BID  . heparin  5,000 Units Subcutaneous Q8H  . isosorbide mononitrate  15 mg Oral QHS  . lisinopril  40 mg Oral Daily  . multivitamin  1 tablet Oral QHS  . pantoprazole  40 mg Oral Q1200  . sevelamer carbonate  2,400 mg Oral TID WC  . timolol  1 drop Both Eyes BID  . Vancomycin  750 mg Intravenous Q M,W,F-HD   No results found. BMET    Component Value Date/Time   NA 138 01/02/2017 0330   NA 136 02/22/2013 1643   K 4.2 01/02/2017 0330   K 3.6 02/22/2013 1643   CL 98 (L) 01/02/2017 0330   CL 103 02/22/2013 1643   CO2 28 01/02/2017 0330   CO2 24 02/22/2013 1643   GLUCOSE 92 01/02/2017 0330   GLUCOSE 87 02/22/2013 1643   BUN 26 (H) 01/02/2017 0330   BUN 48 (H) 02/22/2013 1643   CREATININE 5.51 (H) 01/02/2017 0330   CREATININE 6.02 (H) 02/22/2013 1643   CALCIUM 8.9 01/02/2017 0330   CALCIUM 8.9 02/22/2013 1643   CALCIUM 8.1 (L) 06/24/2012 1634   GFRNONAA 9 (L) 01/02/2017 0330   GFRNONAA 8 (L) 02/22/2013 1643   GFRAA 10 (L) 01/02/2017 0330   GFRAA 9 (L) 02/22/2013 1643   CBC    Component Value Date/Time   WBC 6.6 01/02/2017 0330   RBC 3.66 (L) 01/02/2017 0330   HGB 10.4 (L) 01/02/2017 0330   HGB 11.3 (L) 02/22/2013 1643   HCT 36.0 (L) 01/02/2017 0330   HCT 23.9 (L) 08/19/2016 1101   PLT 221 01/02/2017 0330   PLT 240 02/22/2013 1643   MCV 98.4 01/02/2017 0330   MCV 92 02/22/2013 1643   MCH 28.4 01/02/2017 0330   MCHC 28.9 (L) 01/02/2017 0330   RDW 20.1 (H) 01/02/2017 0330   RDW 15.3 (H) 02/22/2013 1643   LYMPHSABS 0.8 12/28/2016 2000  MONOABS 0.6 12/28/2016 2000   EOSABS 0.1 12/28/2016 2000   BASOSABS 0.0 12/28/2016 2000     Assessment: 1. SP Rt AKA 2. ESRD MWF 3. HTN 4. Anemia  Plan: 1. Hold BP meds for SBP < 120 2. I don't see his family being able to manage him in this state 3. Difficult situation with pain meds as he has pain but meds causing excessive somnolence   Javion Holmer T

## 2017-01-04 NOTE — Progress Notes (Signed)
CM met with pt, pt's daughter in room and pt's sife via tele to confirm discharge plan to home with [facility]; family confirms plan.  CM arranged for ambulance transport home.  Cm notified [facility] rep for start of care ASAP.  No other CM needs were communicated.

## 2017-01-04 NOTE — Discharge Summary (Signed)
Physician Discharge Summary   Patient ID: Randy [name] MRN: [id number] DOB/AGE: 10/16/1931 83 y.o.  Admit date: 12/30/2016 Discharge date: 01/04/2017  Primary Care Physician:  [name], MD  Discharge Diagnoses:    . Osteomyelitis (HCC)Status post right AKA postop day #3 . End stage renal disease, has been peritoneal, now hemodialysis . Anemia of chronic disease . AF (paroxysmal atrial fibrillation) ([place]) . Benign essential HTN . Chronic combined systolic and diastolic CHF (congestive heart failure) ([place])   Consults:   Vascular surgery Nephrology  Recommendations for Outpatient Follow-up:  1. Home health PT OT, home health aide, RN to be arranged by the case management 2. Please repeat CBC/BMET at next visit 3. Due to low BP, lisinopril and Norvasc were discontinued   DIET: Renal diet    Allergies:  No Known Allergies   DISCHARGE MEDICATIONS: Discharge Medication List as of 01/04/2017 10:47 AM    START taking these medications   Details  acetaminophen (TYLENOL) 500 MG tablet Take 2 tablets (1,000 mg total) by mouth 3 (three) times daily. X 4 days, Starting Fri 01/03/2017, Print      CONTINUE these medications which have CHANGED   Details  gabapentin (NEURONTIN) 300 MG capsule Take 1 capsule (300 mg total) by mouth at bedtime., Starting Fri 01/03/2017, Print    oxyCODONE-acetaminophen (PERCOCET/ROXICET) 5-325 MG tablet Take 1 tablet by mouth every 4 (four) hours as needed for severe pain., Starting Sat 01/04/2017, Print    polyethylene glycol (MIRALAX / GLYCOLAX) packet Take 17 g by mouth daily as needed for mild constipation., Starting Sat 01/04/2017, Print      CONTINUE these medications which have NOT CHANGED   Details  atorvastatin (LIPITOR) 80 MG tablet Take 1 tablet (80 mg total) by mouth daily., Starting Thu 11/02/2015, Normal    brimonidine (ALPHAGAN) 0.2 % ophthalmic solution Place 1 drop into both eyes 2 (two) times daily., Historical Med     cinacalcet (SENSIPAR) 60 MG tablet Take 60 mg by mouth daily after supper. , Historical Med    diclofenac sodium (VOLTAREN) 1 % GEL Apply 1 application topically 3 (three) times daily as needed (pain). , Starting Thu 12/12/2016, Historical Med    docusate sodium (COLACE) 100 MG capsule Take 2 capsules (200 mg total) by mouth daily., Starting Tue 10/31/2015, Normal    feeding supplement (BOOST / RESOURCE BREEZE) LIQD Take 1 Container by mouth 4 (four) times daily -  with meals and at bedtime., Starting Thu 03/14/2016, Normal    hydrALAZINE (APRESOLINE) 50 MG tablet Take 50 mg by mouth 2 (two) times daily as needed (high blood pressure >160/80). , Historical Med    isosorbide mononitrate (IMDUR) 30 MG 24 hr tablet TAKE 1/2 TABLET BY MOUTH AT BEDTIME., Normal    ketoconazole (NIZORAL) 2 % cream Apply 1 application topically daily as needed for irritation (itching). , Starting Thu 11/07/2016, Historical Med    lidocaine-prilocaine (EMLA) cream Apply 1 application topically every Monday, Wednesday, and Friday. For dialysis, Starting Fri 10/06/2015, Historical Med    nitroGLYCERIN (NITROSTAT) 0.4 MG SL tablet Place 1 tablet (0.4 mg total) under the tongue every 5 (five) minutes as needed for chest pain., Starting Wed 12/20/2015, Print    pantoprazole (PROTONIX) 40 MG tablet Take 1 tablet (40 mg total) by mouth daily at 12 noon., Starting Mon 10/23/2015, No Print    sevelamer carbonate (RENVELA) 800 MG tablet Take 1,600-2,400 mg by mouth See admin instructions. Take 3 tablets (2400 mg) with meals and 2 tablets (  1600 mg) with snacks, Historical Med    timolol (BETIMOL) 0.5 % ophthalmic solution Place 1 drop into both eyes 2 (two) times daily., Historical Med      STOP taking these medications     amLODipine (NORVASC) 5 MG tablet      cefTAZidime 2 g in dextrose 5 % 50 mL      HYDROcodone-acetaminophen (NORCO/VICODIN) 5-325 MG tablet      lisinopril (PRINIVIL,ZESTRIL) 40 MG tablet      Vancomycin  (VANCOCIN) 750-5 MG/150ML-% SOLN          Brief H and P: For complete details please refer to admission H and P, but in brief 83 y.o.malewith a past medical history significant for ESRD on HD MWF, CAD, HTN, NICM, PVD s/p L AKA and right BKA, and Afib not on AC due to hx GIBwho presents with leg pain. 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  For past few weeks he developed new pain in his right BKA stump with new swelling. He was admitted here for 2 days about a week ago, MRI showed probably early osteomyelitis and cellulitis. It was discussed with ID on the phone who recommended IVvancomycin and Tressie Ellis, which he has been getting with HD. He was seen during that hospitalization by Dr. Donnetta Hutching who noted patient had been seen for similar symptoms back in Nov 2017 and recommended possible AKA which was planned for near future. But since pain was unbearable, he came to the ED.  Patient was admitted to hospitalist service. He underwent right AKA on 4/10  Hospital Course:  Right BKA stump with ischemic pain, Osteomyelitis? - Vascular surgery was consulted, patient underwent right AKA on 4/10. -  patient was placed on vancomycin and Fortaz empirically for osteomyelitis, will not need any antibiotics at discharge. Patient was continued on Neurontin, Percocet 1 tablet every 4 hours as needed for pain control. Difficult balance with the pain control as with 2 tablets of Percocet patient became more somnolent.   - Vascular surgery recommending inpatient rehab,CIR was consulted. Patient was accepted to inpatient rehabilitation however family declined inpatient rehabilitation stating that they would prefer home health physical therapy which was arranged by the case management.  -It was strongly felt that patient's family did not have realistic expectations and patient will benefit from  rehabilitation, he had a bed at inpatient rehabilitation on 01/03/17, however family declined. Patient's family also does not want him to go to skilled nursing facility either. However patient's family continued to state that he is not strong enough. Case management explained to the patient's family about home health PT, OT and possibility of having social work, placement if needed from home.  - Patient is cleared for discharge by vascular surgery, recommended staple removal in 4 weeks  End stage renal disease,on HD ( M,W,F) - Sees Dr Florene Glen.  -continue sensipar and renvela -Nephrology was consulted and patient received hemodialysis per his schedule  Anemia of chronic disease - Hemoglobin currently stable   Chronic combined systolic and diastolic CHF (congestive heart failure) (HCC) euvolemic. volume management with HD  Essential HTN BP was overall on the lower side hence amlodipine and lisinopril was placed on hold. Continue Imdur and hydralazine only.   AF (paroxysmal atrial fibrillation) (HCC) Not on anticoagulation due to history ofGI bleed.   Day of Discharge BP (!) 107/52 (BP Location: Right Arm)  Pulse 82   Temp 98.6 F (37 C) (Oral)   Resp 19   Ht 5\' 8"  (1.727 m)   Wt 63 kg (138 lb 14.2 oz)   SpO2 93%   BMI 21.12 kg/m   Physical Exam: General: Alert and awake oriented x3 not in any acute distress. HEENT: anicteric sclera, pupils reactive to light and accommodation CVS: S1-S2 clear no murmur rubs or gallops Chest: clear to auscultation bilaterally, no wheezing rales or rhonchi Abdomen: soft nontender, nondistended, normal bowel sounds Extremities: Bilateral AKA    The results of significant diagnostics from this hospitalization (including imaging, microbiology, ancillary and laboratory) are listed below for reference.    LAB RESULTS: Basic Metabolic Panel:  Recent Labs Lab 12/31/16 0656  01/01/17 0536 01/02/17 0330  NA  --   < > 137 138  K   --   < > 5.1 4.2  CL  --   < > 92* 98*  CO2  --   < > 29 28  GLUCOSE  --   < > 111* 92  BUN  --   < > 58* 26*  CREATININE  --   < > 9.04* 5.51*  CALCIUM  --   < > 8.5* 8.9  PHOS 5.9*  --   --   --   < > = values in this interval not displayed. Liver Function Tests:  Recent Labs Lab 12/31/16 0700  AST 13*  ALT 13*  ALKPHOS 66  BILITOT 0.5  PROT 6.2*  ALBUMIN 2.7*   No results for input(s): LIPASE, AMYLASE in the last 168 hours. No results for input(s): AMMONIA in the last 168 hours. CBC:  Recent Labs Lab 12/28/16 2000  01/01/17 0536 01/02/17 0330  WBC 6.2  < > 8.1 6.6  NEUTROABS 4.7  --   --   --   HGB 11.8*  < > 10.5* 10.4*  HCT 40.4  < > 35.7* 36.0*  MCV 99.3  < > 97.3 98.4  PLT 249  < > 255 221  < > = values in this interval not displayed. Cardiac Enzymes: No results for input(s): CKTOTAL, CKMB, CKMBINDEX, TROPONINI in the last 168 hours. BNP: Invalid input(s): POCBNP CBG:  Recent Labs Lab 01/03/17 2128 01/04/17 0644  GLUCAP 145* 103*    Significant Diagnostic Studies:  No results found.  2D ECHO:   Disposition and Follow-up: Discharge Instructions    Diet - low sodium heart healthy    Complete by:  As directed    Increase activity slowly    Complete by:  As directed        DISPOSITION: Home with home health   [place]    [name], MD Follow up in 4 week(s).   Specialties:  Vascular Surgery, Cardiology Why:  Our office will call you to arrange an appointment  Contact information: [place] [address] [phone]        [name], MD. Schedule an appointment as soon as possible for a visit in 2 week(s).   Specialty:  Internal Medicine Contact information: [place] [address] [place] Follow up.   Specialty:  [place] Why:  A representative from [facility] will contact you to resume  your home health services.  Contact information: [place] [address] [phone]            Time  spent on Discharge: 25 minutes    Signed:   Estill Cotta M.D. [facility] 01/04/2017, 11:57 AM Pager: [phone]

## 2017-01-04 NOTE — Progress Notes (Signed)
Subjective  -   Complaining of pain at his amputation site   Physical Exam:  Dressing is dry and the right leg amputation site.  Left leg amputation is well-healed       Assessment/Plan:    Stable from a vascular perspective.  Daughter is upset about issues with blood pressure and pain control.  Annamarie Major 01/04/2017 9:46 AM --  Vitals:   01/04/17 0034 01/04/17 0517  BP: (!) 111/57 (!) 107/52  Pulse: 71 82  Resp: 19 19  Temp: 98.2 F (36.8 C) 98.6 F (37 C)    Intake/Output Summary (Last 24 hours) at 01/04/17 0946 Last data filed at 01/03/17 2011  Gross per 24 hour  Intake              650 ml  Output                1 ml  Net              649 ml     Laboratory CBC    Component Value Date/Time   WBC 6.6 01/02/2017 0330   HGB 10.4 (L) 01/02/2017 0330   HGB 11.3 (L) 02/22/2013 1643   HCT 36.0 (L) 01/02/2017 0330   HCT 23.9 (L) 08/19/2016 1101   PLT 221 01/02/2017 0330   PLT 240 02/22/2013 1643    BMET    Component Value Date/Time   NA 138 01/02/2017 0330   NA 136 02/22/2013 1643   K 4.2 01/02/2017 0330   K 3.6 02/22/2013 1643   CL 98 (L) 01/02/2017 0330   CL 103 02/22/2013 1643   CO2 28 01/02/2017 0330   CO2 24 02/22/2013 1643   GLUCOSE 92 01/02/2017 0330   GLUCOSE 87 02/22/2013 1643   BUN 26 (H) 01/02/2017 0330   BUN 48 (H) 02/22/2013 1643   CREATININE 5.51 (H) 01/02/2017 0330   CREATININE 6.02 (H) 02/22/2013 1643   CALCIUM 8.9 01/02/2017 0330   CALCIUM 8.9 02/22/2013 1643   CALCIUM 8.1 (L) 06/24/2012 1634   GFRNONAA 9 (L) 01/02/2017 0330   GFRNONAA 8 (L) 02/22/2013 1643   GFRAA 10 (L) 01/02/2017 0330   GFRAA 9 (L) 02/22/2013 1643    COAG Lab Results  Component Value Date   INR 1.10 12/31/2016   INR 1.05 12/22/2016   INR 1.12 08/19/2016   No results found for: PTT  Antibiotics Anti-infectives    Start     Dose/Rate Route Frequency Ordered Stop   01/03/17 1505  Vancomycin (VANCOCIN) 750-5 MG/150ML-% IVPB    Comments:   Cherylann Banas   : cabinet override      01/03/17 1505 01/03/17 1519   01/01/17 2000  cefTAZidime (FORTAZ) 2 g in dextrose 5 % 50 mL IVPB     2 g 100 mL/hr over 30 Minutes Intravenous Every M-W-F (2000) 12/31/16 0020     01/01/17 1200  vancomycin (VANCOCIN) IVPB 750 mg/150 ml premix     750 mg 150 mL/hr over 60 Minutes Intravenous Every M-W-F (Hemodialysis) 12/31/16 0020     12/31/16 2200  cefUROXime (ZINACEF) 1.5 g in dextrose 5 % 50 mL IVPB  Status:  Discontinued     1.5 g 100 mL/hr over 30 Minutes Intravenous Every 12 hours 12/31/16 1201 12/31/16 1229   12/31/16 0903  cefUROXime (ZINACEF) 1.5 g in dextrose 5 % 50 mL IVPB     1.5 g 100 mL/hr over 30 Minutes Intravenous 30 min pre-op 12/31/16 0903 12/31/16 1032  12/31/16 0902  dextrose 5 % with cefUROXime (ZINACEF) ADS Med    Comments:  [name]   : cabinet override      12/31/16 0902 12/31/16 1017       V. Leia Alf, M.D. [facility] Office: [phone] Pager:  [phone]

## 2017-01-06 ENCOUNTER — Emergency Department (HOSPITAL_COMMUNITY): Payer: Medicare Other

## 2017-01-06 ENCOUNTER — Inpatient Hospital Stay (HOSPITAL_COMMUNITY): Payer: Medicare Other

## 2017-01-06 ENCOUNTER — Encounter (HOSPITAL_COMMUNITY): Payer: Self-pay | Admitting: Emergency Medicine

## 2017-01-06 ENCOUNTER — Inpatient Hospital Stay (HOSPITAL_COMMUNITY)
Admission: EM | Admit: 2017-01-06 | Discharge: 2017-01-21 | DRG: 308 | Disposition: E | Payer: Medicare Other | Attending: Pulmonary Disease | Admitting: Pulmonary Disease

## 2017-01-06 DIAGNOSIS — Z992 Dependence on renal dialysis: Secondary | ICD-10-CM

## 2017-01-06 DIAGNOSIS — I493 Ventricular premature depolarization: Secondary | ICD-10-CM | POA: Diagnosis present

## 2017-01-06 DIAGNOSIS — I739 Peripheral vascular disease, unspecified: Secondary | ICD-10-CM | POA: Diagnosis present

## 2017-01-06 DIAGNOSIS — I959 Hypotension, unspecified: Secondary | ICD-10-CM | POA: Diagnosis not present

## 2017-01-06 DIAGNOSIS — L8990 Pressure ulcer of unspecified site, unspecified stage: Secondary | ICD-10-CM | POA: Insufficient documentation

## 2017-01-06 DIAGNOSIS — M109 Gout, unspecified: Secondary | ICD-10-CM | POA: Diagnosis present

## 2017-01-06 DIAGNOSIS — R402312 Coma scale, best motor response, none, at arrival to emergency department: Secondary | ICD-10-CM | POA: Diagnosis present

## 2017-01-06 DIAGNOSIS — J9600 Acute respiratory failure, unspecified whether with hypoxia or hypercapnia: Secondary | ICD-10-CM

## 2017-01-06 DIAGNOSIS — N186 End stage renal disease: Secondary | ICD-10-CM | POA: Diagnosis present

## 2017-01-06 DIAGNOSIS — D631 Anemia in chronic kidney disease: Secondary | ICD-10-CM | POA: Diagnosis present

## 2017-01-06 DIAGNOSIS — I428 Other cardiomyopathies: Secondary | ICD-10-CM | POA: Diagnosis present

## 2017-01-06 DIAGNOSIS — Z66 Do not resuscitate: Secondary | ICD-10-CM | POA: Diagnosis not present

## 2017-01-06 DIAGNOSIS — I462 Cardiac arrest due to underlying cardiac condition: Secondary | ICD-10-CM | POA: Diagnosis present

## 2017-01-06 DIAGNOSIS — Z823 Family history of stroke: Secondary | ICD-10-CM

## 2017-01-06 DIAGNOSIS — Z79899 Other long term (current) drug therapy: Secondary | ICD-10-CM

## 2017-01-06 DIAGNOSIS — I472 Ventricular tachycardia: Secondary | ICD-10-CM | POA: Diagnosis present

## 2017-01-06 DIAGNOSIS — E875 Hyperkalemia: Secondary | ICD-10-CM | POA: Diagnosis present

## 2017-01-06 DIAGNOSIS — Z89612 Acquired absence of left leg above knee: Secondary | ICD-10-CM

## 2017-01-06 DIAGNOSIS — Z515 Encounter for palliative care: Secondary | ICD-10-CM | POA: Diagnosis not present

## 2017-01-06 DIAGNOSIS — Z89611 Acquired absence of right leg above knee: Secondary | ICD-10-CM

## 2017-01-06 DIAGNOSIS — E7800 Pure hypercholesterolemia, unspecified: Secondary | ICD-10-CM | POA: Diagnosis present

## 2017-01-06 DIAGNOSIS — I469 Cardiac arrest, cause unspecified: Secondary | ICD-10-CM | POA: Diagnosis present

## 2017-01-06 DIAGNOSIS — R402212 Coma scale, best verbal response, none, at arrival to emergency department: Secondary | ICD-10-CM | POA: Diagnosis present

## 2017-01-06 DIAGNOSIS — Z800 Family history of malignant neoplasm of digestive organs: Secondary | ICD-10-CM

## 2017-01-06 DIAGNOSIS — I4729 Other ventricular tachycardia: Secondary | ICD-10-CM

## 2017-01-06 DIAGNOSIS — I2510 Atherosclerotic heart disease of native coronary artery without angina pectoris: Secondary | ICD-10-CM | POA: Diagnosis present

## 2017-01-06 DIAGNOSIS — Z4659 Encounter for fitting and adjustment of other gastrointestinal appliance and device: Secondary | ICD-10-CM

## 2017-01-06 DIAGNOSIS — E872 Acidosis: Secondary | ICD-10-CM | POA: Diagnosis present

## 2017-01-06 DIAGNOSIS — G931 Anoxic brain damage, not elsewhere classified: Secondary | ICD-10-CM | POA: Diagnosis present

## 2017-01-06 DIAGNOSIS — J9602 Acute respiratory failure with hypercapnia: Secondary | ICD-10-CM | POA: Diagnosis present

## 2017-01-06 DIAGNOSIS — M869 Osteomyelitis, unspecified: Secondary | ICD-10-CM | POA: Diagnosis present

## 2017-01-06 DIAGNOSIS — I480 Paroxysmal atrial fibrillation: Secondary | ICD-10-CM | POA: Diagnosis present

## 2017-01-06 DIAGNOSIS — Z87891 Personal history of nicotine dependence: Secondary | ICD-10-CM | POA: Diagnosis not present

## 2017-01-06 DIAGNOSIS — I120 Hypertensive chronic kidney disease with stage 5 chronic kidney disease or end stage renal disease: Secondary | ICD-10-CM | POA: Diagnosis present

## 2017-01-06 DIAGNOSIS — R509 Fever, unspecified: Secondary | ICD-10-CM | POA: Diagnosis present

## 2017-01-06 DIAGNOSIS — R402112 Coma scale, eyes open, never, at arrival to emergency department: Secondary | ICD-10-CM | POA: Diagnosis present

## 2017-01-06 DIAGNOSIS — Z993 Dependence on wheelchair: Secondary | ICD-10-CM | POA: Diagnosis not present

## 2017-01-06 DIAGNOSIS — Z8673 Personal history of transient ischemic attack (TIA), and cerebral infarction without residual deficits: Secondary | ICD-10-CM

## 2017-01-06 DIAGNOSIS — I4901 Ventricular fibrillation: Principal | ICD-10-CM | POA: Diagnosis present

## 2017-01-06 LAB — CBC WITH DIFFERENTIAL/PLATELET
BASOS ABS: 0 10*3/uL (ref 0.0–0.1)
Basophils Relative: 0 %
EOS PCT: 0 %
Eosinophils Absolute: 0 10*3/uL (ref 0.0–0.7)
HEMATOCRIT: 34.8 % — AB (ref 39.0–52.0)
Hemoglobin: 10 g/dL — ABNORMAL LOW (ref 13.0–17.0)
LYMPHS PCT: 9 %
Lymphs Abs: 1 10*3/uL (ref 0.7–4.0)
MCH: 29.2 pg (ref 26.0–34.0)
MCHC: 28.7 g/dL — ABNORMAL LOW (ref 30.0–36.0)
MCV: 101.5 fL — ABNORMAL HIGH (ref 78.0–100.0)
Monocytes Absolute: 0.5 10*3/uL (ref 0.1–1.0)
Monocytes Relative: 5 %
NEUTROS ABS: 9.4 10*3/uL — AB (ref 1.7–7.7)
NEUTROS PCT: 86 %
Platelets: 199 10*3/uL (ref 150–400)
RBC: 3.43 MIL/uL — AB (ref 4.22–5.81)
RDW: 20.7 % — ABNORMAL HIGH (ref 11.5–15.5)
WBC MORPHOLOGY: INCREASED
WBC: 10.9 10*3/uL — AB (ref 4.0–10.5)

## 2017-01-06 LAB — BLOOD GAS, ARTERIAL
Acid-base deficit: 4.4 mmol/L — ABNORMAL HIGH (ref 0.0–2.0)
BICARBONATE: 21 mmol/L (ref 20.0–28.0)
DRAWN BY: 270161
FIO2: 100
LHR: 14 {breaths}/min
MECHVT: 500 mL
O2 Saturation: 99.6 %
PATIENT TEMPERATURE: 37.4
PEEP/CPAP: 5 cmH2O
PO2 ART: 444 mmHg — AB (ref 83.0–108.0)
pCO2 arterial: 34.3 mmHg (ref 32.0–48.0)
pH, Arterial: 7.38 (ref 7.350–7.450)

## 2017-01-06 LAB — MAGNESIUM: Magnesium: 2.5 mg/dL — ABNORMAL HIGH (ref 1.7–2.4)

## 2017-01-06 LAB — COMPREHENSIVE METABOLIC PANEL
ALBUMIN: 2 g/dL — AB (ref 3.5–5.0)
ALT: 43 U/L (ref 17–63)
AST: 115 U/L — AB (ref 15–41)
Alkaline Phosphatase: 100 U/L (ref 38–126)
Anion gap: 19 — ABNORMAL HIGH (ref 5–15)
BILIRUBIN TOTAL: 0.6 mg/dL (ref 0.3–1.2)
BUN: 42 mg/dL — AB (ref 6–20)
CHLORIDE: 99 mmol/L — AB (ref 101–111)
CO2: 20 mmol/L — ABNORMAL LOW (ref 22–32)
CREATININE: 6.16 mg/dL — AB (ref 0.61–1.24)
Calcium: 8.3 mg/dL — ABNORMAL LOW (ref 8.9–10.3)
GFR calc Af Amer: 9 mL/min — ABNORMAL LOW (ref >60)
GFR calc non Af Amer: 7 mL/min — ABNORMAL LOW (ref >60)
GLUCOSE: 149 mg/dL — AB (ref 65–99)
Potassium: 4.6 mmol/L (ref 3.5–5.1)
Sodium: 138 mmol/L (ref 135–145)
TOTAL PROTEIN: 5.8 g/dL — AB (ref 6.5–8.1)

## 2017-01-06 LAB — I-STAT CHEM 8, ED
BUN: 41 mg/dL — ABNORMAL HIGH (ref 6–20)
CALCIUM ION: 1.04 mmol/L — AB (ref 1.15–1.40)
CHLORIDE: 104 mmol/L (ref 101–111)
Creatinine, Ser: 6 mg/dL — ABNORMAL HIGH (ref 0.61–1.24)
GLUCOSE: 147 mg/dL — AB (ref 65–99)
HCT: 32 % — ABNORMAL LOW (ref 39.0–52.0)
Hemoglobin: 10.9 g/dL — ABNORMAL LOW (ref 13.0–17.0)
Potassium: 4.5 mmol/L (ref 3.5–5.1)
SODIUM: 136 mmol/L (ref 135–145)
TCO2: 20 mmol/L (ref 0–100)

## 2017-01-06 LAB — I-STAT CG4 LACTIC ACID, ED
Lactic Acid, Venous: 11.35 mmol/L (ref 0.5–1.9)
Lactic Acid, Venous: 6.81 mmol/L (ref 0.5–1.9)

## 2017-01-06 LAB — GLUCOSE, CAPILLARY
GLUCOSE-CAPILLARY: 159 mg/dL — AB (ref 65–99)
Glucose-Capillary: 126 mg/dL — ABNORMAL HIGH (ref 65–99)

## 2017-01-06 LAB — I-STAT TROPONIN, ED: TROPONIN I, POC: 0.75 ng/mL — AB (ref 0.00–0.08)

## 2017-01-06 LAB — TROPONIN I: TROPONIN I: 1.08 ng/mL — AB (ref <0.03)

## 2017-01-06 LAB — BRAIN NATRIURETIC PEPTIDE: B Natriuretic Peptide: 2027 pg/mL — ABNORMAL HIGH (ref 0.0–100.0)

## 2017-01-06 MED ORDER — AMIODARONE HCL IN DEXTROSE 360-4.14 MG/200ML-% IV SOLN
30.0000 mg/h | INTRAVENOUS | Status: DC
  Filled 2017-01-06 (×2): qty 200

## 2017-01-06 MED ORDER — SODIUM BICARBONATE 8.4 % IV SOLN
100.0000 meq | Freq: Once | INTRAVENOUS | Status: AC
  Administered 2017-01-06: 100 meq via INTRAVENOUS
  Filled 2017-01-06: qty 100

## 2017-01-06 MED ORDER — SODIUM CHLORIDE 0.9 % IV SOLN: 250.0000 mL | INTRAVENOUS | Status: DC | PRN

## 2017-01-06 MED ORDER — PIPERACILLIN-TAZOBACTAM 3.375 G IVPB 30 MIN
3.3750 g | Freq: Once | INTRAVENOUS | Status: AC
  Administered 2017-01-06: 3.375 g via INTRAVENOUS
  Filled 2017-01-06: qty 50

## 2017-01-06 MED ORDER — AMIODARONE HCL IN DEXTROSE 360-4.14 MG/200ML-% IV SOLN
60.0000 mg/h | INTRAVENOUS | Status: AC
  Administered 2017-01-06: 60 mg/h via INTRAVENOUS
  Filled 2017-01-06: qty 200

## 2017-01-06 MED ORDER — HEPARIN SODIUM (PORCINE) 5000 UNIT/ML IJ SOLN: 5000.0000 [IU] | Freq: Three times a day (TID) | INTRAMUSCULAR | Status: DC

## 2017-01-06 MED ORDER — SODIUM CHLORIDE 0.9 % IV BOLUS (SEPSIS)
500.0000 mL | Freq: Once | INTRAVENOUS | Status: AC
  Administered 2017-01-06: 500 mL via INTRAVENOUS

## 2017-01-06 MED ORDER — VANCOMYCIN HCL IN DEXTROSE 1-5 GM/200ML-% IV SOLN
1000.0000 mg | Freq: Once | INTRAVENOUS | Status: AC
  Administered 2017-01-06: 1000 mg via INTRAVENOUS
  Filled 2017-01-06: qty 200

## 2017-01-06 MED ORDER — CHLORHEXIDINE GLUCONATE 0.12% ORAL RINSE (MEDLINE KIT)
15.0000 mL | Freq: Two times a day (BID) | OROMUCOSAL | Status: DC
  Administered 2017-01-06 – 2017-01-07 (×2): 15 mL via OROMUCOSAL

## 2017-01-06 MED ORDER — ORAL CARE MOUTH RINSE
15.0000 mL | OROMUCOSAL | Status: DC
  Administered 2017-01-06 – 2017-01-07 (×9): 15 mL via OROMUCOSAL

## 2017-01-06 MED ORDER — AMIODARONE HCL 150 MG/3ML IV SOLN
INTRAVENOUS | Status: AC
  Filled 2017-01-06: qty 3

## 2017-01-06 MED ORDER — AMIODARONE IV BOLUS ONLY 150 MG/100ML
INTRAVENOUS | Status: AC
  Administered 2017-01-06: 150 mg
  Filled 2017-01-06: qty 100

## 2017-01-06 MED ORDER — PANTOPRAZOLE SODIUM 40 MG IV SOLR
40.0000 mg | INTRAVENOUS | Status: DC
  Administered 2017-01-06: 40 mg via INTRAVENOUS
  Filled 2017-01-06: qty 40

## 2017-01-06 MED ORDER — NOREPINEPHRINE BITARTRATE 1 MG/ML IV SOLN
0.0000 ug/min | INTRAVENOUS | Status: DC
  Administered 2017-01-06: 3 ug/min via INTRAVENOUS
  Administered 2017-01-06: 7 ug/min via INTRAVENOUS
  Administered 2017-01-06: 5 ug/min via INTRAVENOUS
  Administered 2017-01-06: 4 ug/min via INTRAVENOUS
  Administered 2017-01-06: 2 ug/min via INTRAVENOUS
  Administered 2017-01-06: 6 ug/min via INTRAVENOUS
  Administered 2017-01-07: 10 ug/min via INTRAVENOUS
  Filled 2017-01-06 (×2): qty 4

## 2017-01-06 MED ORDER — HEPARIN SODIUM (PORCINE) 5000 UNIT/ML IJ SOLN
5000.0000 [IU] | Freq: Three times a day (TID) | INTRAMUSCULAR | Status: DC
  Administered 2017-01-06 – 2017-01-07 (×3): 5000 [IU] via SUBCUTANEOUS
  Filled 2017-01-06 (×4): qty 1

## 2017-01-06 MED ORDER — CALCIUM GLUCONATE 10 % IV SOLN
INTRAVENOUS | Status: AC
  Administered 2017-01-06: 1000 mg
  Filled 2017-01-06: qty 10

## 2017-01-06 MED ORDER — PIPERACILLIN-TAZOBACTAM 3.375 G IVPB
3.3750 g | Freq: Two times a day (BID) | INTRAVENOUS | Status: DC
  Administered 2017-01-06: 3.375 g via INTRAVENOUS
  Filled 2017-01-06 (×2): qty 50

## 2017-01-06 NOTE — ED Notes (Signed)
Date and time results received: 01/08/2017 1:23 PM   Test: Troponin Critical Value: 1.08  Name of Provider Notified: Lita Mains  Orders Received? Or Actions Taken?: Actions Taken: Doctor [facility] informed.

## 2017-01-06 NOTE — Progress Notes (Signed)
Dentures take out of patients mouth by myself and Terri, RT and placed at bedside with patient label.

## 2017-01-06 NOTE — H&P (Signed)
PULMONARY / CRITICAL CARE MEDICINE   Name: Randy [name] MRN: [id number] DOB: 1932-05-24    ADMISSION DATE:  12/25/2016 CONSULTATION DATE:  12/27/2016  REFERRING MD:  Dr. Lita Mains (EDP)  CHIEF COMPLAINT:  Cardiac Arrest  HISTORY OF PRESENT ILLNESS:  85 yoM who presented to [facility] with cardiac arrest on 4/16.    Patient has a medical history of ESRD on HD MWF, CAD, HTN, NICM, chronic anemia, PVD s/p L AKA and recent right AKA (on 4/10), former smoker and Afib not on [place] due to prior GIB.  He was recently discharged on 4/14 s/p right AKA secondary to cellulitis and was treated empirically for osteomyelitis.  It was recommended patient go to inpatient rehab or SNF on discharge, however the family declined and the patient went home with home health PT.    He was at dialysis and went into cardiac arrest.  It is unclear if or how much dialysis patient received.  Per medical chart review, CPR was started at 0620/0724 with a PEA arrest.  He was given four doses of epinephrine and defibrillated once for one episode of ventricular fibrillation. Narcan was given without response.  ROSC achieved at 0755.  He arrived at [facility] ED intubated per EMS.      Workup in the ED notable for WBC 10.9, Hgb 10, Na 138, K 4.6, glucose 149, sCr 6.16, BUN 42, Mag 2.5, AST 115,  BNP 2027, troponin I 1.08, lactate 11.35 down to 6.81 after NS bolus of 546ml.  ABG 7.38/34.3/444.  He was having multiple PVCs and short runs of ventricular tachycardia which was treated with amiodarone bolus and then placed on drip. Calcium gluconate and sodium bicarb was additionally given in concern for hyperkalemia.  He gradually became hypotensive and was placed on levophed.  He was started on empiric vancomycin and zosyn coverage.  He remained unresponsive after ROSC and has not required or received any sedation.   Patient to be transferred to [facility] for further evaluation and treatment.  PCCM to admit.      PAST MEDICAL HISTORY :  He   has a past medical history of Anemia of chronic disease; Arthritis; CAD (coronary artery disease); ESRD on hemodialysis ([place]); Essential hypertension; Gout; History of blood transfusion; History of myopericarditis (2015); History of pneumonia (2014); History of stroke; Hypercholesterolemia; Nonischemic cardiomyopathy ([place]); Peripheral vascular disease ([place]); and Pneumonia.  PAST SURGICAL HISTORY: He  has a past surgical history that includes Knee arthroscopy (Right, 2007); Back surgery; Hemorrhoid surgery (1970's); Ganglion cyst excision (01/03/2012); Colonoscopy; AV fistula placement (08/24/2012); Insertion of dialysis catheter (Right); Insertion of dialysis catheter (10/19/2012); Cholecystectomy; Lumbar disc surgery (2004); left heart catheterization with coronary angiogram (N/A, 08/17/2014); Colonoscopy (Left, 09/26/2014); Amputation (Right, 01/05/2015); CAPD removal (N/A, 03/13/2015); Cardiac catheterization; Above knee leg amputaton (Left, 10/10/2015); Amputation (Left, 10/10/2015); Colonoscopy (N/A, 03/05/2016); Esophagogastroduodenoscopy (N/A, 03/05/2016); Givens capsule study (N/A, 03/07/2016); and Amputation (Right, 12/31/2016).  No Known Allergies  No current facility-administered medications on file prior to encounter.    Current Outpatient Prescriptions on File Prior to Encounter  Medication Sig  . acetaminophen (TYLENOL) 500 MG tablet Take 2 tablets (1,000 mg total) by mouth 3 (three) times daily. X 4 days  . atorvastatin (LIPITOR) 80 MG tablet Take 1 tablet (80 mg total) by mouth daily. (Patient taking differently: Take 80 mg by mouth daily after supper. )  . brimonidine (ALPHAGAN) 0.2 % ophthalmic solution Place 1 drop into both eyes 2 (two) times daily.  . cinacalcet (SENSIPAR) 60  MG tablet Take 60 mg by mouth daily after supper.   . diclofenac sodium (VOLTAREN) 1 % GEL Apply 1 application topically 3 (three) times daily as needed (pain).   Marland Kitchen docusate sodium (COLACE) 100 MG capsule Take 2  capsules (200 mg total) by mouth daily. (Patient taking differently: Take 200-300 mg by mouth daily as needed for mild constipation or moderate constipation. )  . feeding supplement (BOOST / RESOURCE BREEZE) LIQD Take 1 Container by mouth 4 (four) times daily -  with meals and at bedtime. (Patient taking differently: Take 1 Container by mouth daily. )  . gabapentin (NEURONTIN) 300 MG capsule Take 1 capsule (300 mg total) by mouth at bedtime.  . hydrALAZINE (APRESOLINE) 50 MG tablet Take 50 mg by mouth 2 (two) times daily as needed (high blood pressure >160/80).   . isosorbide mononitrate (IMDUR) 30 MG 24 hr tablet TAKE 1/2 TABLET BY MOUTH AT BEDTIME.  Marland Kitchen ketoconazole (NIZORAL) 2 % cream Apply 1 application topically daily as needed for irritation (itching).   Marland Kitchen lidocaine-prilocaine (EMLA) cream Apply 1 application topically every Monday, Wednesday, and Friday. For dialysis  . nitroGLYCERIN (NITROSTAT) 0.4 MG SL tablet Place 1 tablet (0.4 mg total) under the tongue every 5 (five) minutes as needed for chest pain.  Marland Kitchen oxyCODONE-acetaminophen (PERCOCET/ROXICET) 5-325 MG tablet Take 1 tablet by mouth every 4 (four) hours as needed for severe pain.  . pantoprazole (PROTONIX) 40 MG tablet Take 1 tablet (40 mg total) by mouth daily at 12 noon.  . polyethylene glycol (MIRALAX / GLYCOLAX) packet Take 17 g by mouth daily as needed for mild constipation.  . sevelamer carbonate (RENVELA) 800 MG tablet Take 1,600-2,400 mg by mouth See admin instructions. Take 3 tablets (2400 mg) with meals and 2 tablets (1600 mg) with snacks  . timolol (BETIMOL) 0.5 % ophthalmic solution Place 1 drop into both eyes 2 (two) times daily.    FAMILY HISTORY:  Family include history of cancer in his sister, colon cancer in his brother, and stroke in his mother.   SOCIAL HISTORY: He  reports that he quit smoking about 18 years ago. His smoking use included Cigarettes. He has a 30.00 pack-year smoking history. He quit smokeless tobacco  use about 23 years ago. His smokeless tobacco use included Chew. He reports that he does not drink alcohol or use drugs.   He lives with his wife at home in [place].  He formerly worked for [employer].  He is wheelchair bound and uses a board for transfer.   REVIEW OF SYSTEMS:  Unable as patient is currently unresponsive and intubated.    SUBJECTIVE:   VITAL SIGNS: BP 126/76   Pulse 69   Temp 99 F (37.2 C) (Rectal)   Resp 14   Wt 140 lb (63.5 kg)   SpO2 100%   BMI 21.29 kg/m   HEMODYNAMICS:    VENTILATOR SETTINGS: Vent Mode: PRVC FiO2 (%):  [60 %-100 %] 60 % Set Rate:  [14 bmp] 14 bmp Vt Set:  [500 mL] 500 mL PEEP:  [5 cmH20] 5 cmH20 Plateau Pressure:  [18 cmH20-21 cmH20] 21 cmH20  INTAKE / OUTPUT: No intake/output data recorded.  PHYSICAL EXAMINATION: General:  Frail elderly male in NAD on vent HEENT: [place]/AT, PERRL, no JVD Neuro: comatose on no sedation CV: s1s2 rrr, no m/r/g PULM: even/non-labored, lungs clear bilaterally WF:UXNA, non-distended, bsx4 active  Extremities: warm/dry, edema, LUE AVF , right and left AKA Skin: no rashes or lesions   LABS:  BMET  Recent Labs Lab 01/01/17 0536 01/02/17 0330 01/05/2017 0813 01/11/2017 0841  NA 137 138 136 138  K 5.1 4.2 4.5 4.6  CL 92* 98* 104 99*  CO2 29 28  --  20*  BUN 58* 26* 41* 42*  CREATININE 9.04* 5.51* 6.00* 6.16*  GLUCOSE 111* 92 147* 149*    Electrolytes  Recent Labs Lab 12/31/16 0656  01/01/17 0536 01/02/17 0330 01/01/2017 0841  CALCIUM  --   < > 8.5* 8.9 8.3*  MG  --   --   --   --  2.5*  PHOS 5.9*  --   --   --   --   < > = values in this interval not displayed.  CBC  Recent Labs Lab 01/01/17 0536 01/02/17 0330 01/05/2017 0813 01/12/2017 0841  WBC 8.1 6.6  --  10.9*  HGB 10.5* 10.4* 10.9* 10.0*  HCT 35.7* 36.0* 32.0* 34.8*  PLT 255 221  --  199    Coag's  Recent Labs Lab 12/31/16 0700  APTT 62*  INR 1.10    Sepsis Markers  Recent Labs Lab 01/16/2017 0828  01/12/2017 1157  LATICACIDVEN 11.35* 6.81*    ABG  Recent Labs Lab 01/19/2017 0935  PHART 7.380  PCO2ART 34.3  PO2ART 444*    Liver Enzymes  Recent Labs Lab 12/31/16 0700 01/04/2017 0841  AST 13* 115*  ALT 13* 43  ALKPHOS 66 100  BILITOT 0.5 0.6  ALBUMIN 2.7* 2.0*    Cardiac Enzymes  Recent Labs Lab 12/25/2016 1145  TROPONINI 1.08*    Glucose  Recent Labs Lab 01/02/17 2144 01/03/17 0632 01/03/17 1132 01/03/17 1728 01/03/17 2128 01/04/17 0644  GLUCAP 131* 91 104* 129* 145* 103*    Imaging Ct Head Wo Contrast  Result Date: 12/26/2016 CLINICAL DATA:  Altered mental status. EXAM: CT HEAD WITHOUT CONTRAST TECHNIQUE: Contiguous axial images were obtained from the base of the skull through the vertex without intravenous contrast. COMPARISON:  CT scan of February 21, 2006. FINDINGS: Brain: Mild diffuse cortical atrophy is noted. Minimal chronic ischemic white matter disease is noted. No mass effect or midline shift is noted. Ventricular size is within normal limits. There is no evidence of mass lesion, hemorrhage or acute infarction. Vascular: No hyperdense vessel or unexpected calcification. Skull: Normal. Negative for fracture or focal lesion. Sinuses/Orbits: No acute finding. Other: None. IMPRESSION: Mild diffuse cortical atrophy. Minimal chronic ischemic white matter disease. No acute intracranial abnormality seen. Electronically Signed   By: Marijo Conception, M.D.   On: 01/19/2017 11:36   Dg Chest Port 1 View  Result Date: 01/09/2017 CLINICAL DATA:  Cardiac arrest during dialysis. Post intubation and nasogastric tube placement. EXAM: PORTABLE CHEST 1 VIEW COMPARISON:  12/17/2015. FINDINGS: Endotracheal tube terminates approximately 3.8 cm above the carina. Nasogastric tube is followed into the distal esophagus with the tip projecting beyond the inferior margin of the image. Defibrillator pad projects over the left hemithorax. Heart is enlarged. Thoracic aorta is calcified. Left  lower lobe opacification, elevated left hemidiaphragm and blunting of left costophrenic angle are stable. Right lung is grossly clear. IMPRESSION: Pleural-parenchymal scarring and volume loss at the base of the left hemithorax. No definite acute findings. Electronically Signed   By: Lorin Picket M.D.   On: 01/15/2017 08:40     STUDIES:  Head CT 4/16 >> no acute findings  CULTURES: [facility] Spine 4/16 >>  ANTIBIOTICS: Vancomycin 4/16 >> Zosyn 4/16 >>   SIGNIFICANT EVENTS: 4/16  Admit Cardiac Arrest   LINES/TUBES:  ETT 4/16 >>  DISCUSSION: 40 yoM admitted on 4/16 after 30 min PEA/ v-fib arrest that occurred at dialysis.  Epi x 4, defib x 1.  Remained unresponsive post ROSC.    ASSESSMENT / PLAN:  PULMONARY A: Acute respiratory failure - in the setting of cardiac arrest  P:   PRVC 8cc/kg Wean PEEP/ FiO2 for sats >92% VAP measures CXR now and in am  CARDIOVASCULAR A:  PEA/ V-fib cardiac arrest - etiology unclear  Ventricular ectopy  Shock - unclear etiology  Hx PAF (not on [place]), NICM, HTN, PVD P:  ICU monitoring Levophed for map >54mmHg Continue amiodarone gtt  Trend troponin x 3 EKG now Assess ECHO Consider cardiology consult Hold home amlodipine, atorvastatin, hydralazine, imdur,  Limited code if arrests - no CPR, no defib  RENAL A:   Lactate Acidosis  ESRD on HD MWF P:   Trend lactate Consult nephrology in AM Trend BMP Hold home renvela  GASTROINTESTINAL A:   No acute issues Hx of GIB P:   PPI for SUP Place OGT NPO for now  HEMATOLOGIC A:   Hx Anemia of Chronic Disease P:  Trend CBC Transfuse for Hgb <7 Heparin SQ for DVT prophylaxis   INFECTIOUS A:   No acute process Hx of prior RLE cellulitis/ ?osteo P:   Continue empiric abx Follow cultures Trend fever curve/ WBC Check PCT  ENDOCRINE A:   No acute issues P:   Monitor   NEUROLOGIC A:   Acute encephalopathy - in the setting of post cardiac arrest, negative head CT, no response to  narcan R/O anoxic injury P:   RASS goal: 0/ -1 Frequent neuro exams  FAMILY  - Updates: Family updated at length by Dr. Elsworth Soho  - Inter-disciplinary family meet or Palliative Care meeting due by:  January 13, 2017.   Georgann Housekeeper, AGACNP-BC [facility] Pulmonology/Critical Care Pager [phone] or [phone]  01/16/2017 5:04 PM

## 2017-01-06 NOTE — Progress Notes (Signed)
eLink Physician-Brief Progress Note Patient Name: Randy [name] DOB: 19-Oct-1931 MRN: [id number]   Date of Service  01/03/2017  HPI/Events of Note  83 yo male with PMH of CAD (managed medically), ESRD on HD, Nonischemic Cardiomyopathy (LVEF 35% - 40%) PVD - s/p R BKA, Anemia of Chronic Disease, Arthritis, HTN, Gout and Pneumonia. Now s/p cardiac arrest on HD. Transferred from [facility] ED intubated and mechanically ventilated. Head CT Scan - Mild diffuse cortical atrophy. Minimal chronic ischemic white matter disease. No acute intracranial abnormality seen. CXR - ETT 3.8 cm above the carina. GCS = 3  at [facility].    eICU Interventions  Will order: 1. Ventilator settings: 40%/PRVC 16/TV 500/P 5. 2. ABG at 5 PM.  3. Protonix 40 mg IV now and Q day. 4. Heparin 5000 units [place] Q 8 hours.      Intervention Category Evaluation Type: New Patient Evaluation  Lysle Dingwall 12/24/2016, 4:43 PM

## 2017-01-06 NOTE — ED Notes (Signed)
I-stat [place] of 11.35 notified MD.

## 2017-01-06 NOTE — ED Notes (Addendum)
I-stat troponin 0.75 reported to MD.  No repeat requested.

## 2017-01-06 NOTE — ED Triage Notes (Signed)
Patient brought in by EMS. States he was patient at dialysis clinic and went into cardiac arrest. States CPR started at [phone]. Administered epi x 4 and shocked x 1. States patient given narcan 2mg  nasal at facility with no response. States palpable pulses at 0755. Patient arrives intubated via EMS.

## 2017-01-06 NOTE — Progress Notes (Signed)
Pharmacy Antibiotic Note  Randy [name] is a 83 y.o. male with ESRD on HD MWF, who had a recent hospitalization (4/9-4/14) and s/p R-AKA d/t osteomyelitis. He is readmitted d/t cardiac arrest during HD and transferred from [facility] ED to [facility] for further care. Pharmacy is consulted to dose vancomycin for r/o sepsis.   Noted he was on vanc/zosyn during previous admission and received 750 mg vanc after HD on 4/13, he also received 1g vanc prior to transfer this morning at 0930 which was probably given after HD (unclear how long of the dialysis was completed). He also received a dose of zosyn 3.375g at 0930. Blood cultures are pending   Plan:  Zosyn 3.375g IV Q 12 hrs (4 hr infusion), next dose 2200 No vancomycin for now, since pt has already received vancomycin this morning.  Might consider checking preHD vanc level before next dose. f/u cultures and HD schedule   Weight: 140 lb 6.9 oz (63.7 kg)  Temp (24hrs), Avg:98.6 F (37 C), Min:97.6 F (36.4 C), Max:99.1 F (37.3 C)   Recent Labs Lab 12/30/16 2130 12/31/16 0700 01/01/17 0536 01/02/17 0330 12/24/2016 0813 01/16/2017 0828 01/12/2017 0841 12/23/2016 1157  WBC 8.0 7.7 8.1 6.6  --   --  10.9*  --   CREATININE  --  7.54* 9.04* 5.51* 6.00*  --  6.16*  --   LATICACIDVEN  --   --   --   --   --  11.35*  --  6.81*    Estimated Creatinine Clearance: 8.2 mL/min (A) (by C-G formula based on SCr of 6.16 mg/dL (H)).    No Known Allergies  Antimicrobials this admission: vanc 4/16 >>  Zosyn 4/16 >>   Previous admission: Vancomycin 4/11 >> 4/13 Ceftazidime 4/11 >> 4/13 Dose adjustments this admission:   Microbiology results: 4/16 BCx: 4/10 MRSA PCR: neg  Thank you for allowing pharmacy to be a part of this patient's care.  Maryanna Shape, PharmD, BCPS  Clinical Pharmacist  Pager: [phone]   01/03/2017 5:10 PM

## 2017-01-06 NOTE — ED Provider Notes (Signed)
[facility] DEPT Provider Note   CSN: [id number] Arrival date & time: 01/10/2017  0804     History   Chief Complaint Chief Complaint  Patient presents with  . Cardiac Arrest    HPI Randy [name] is a 83 y.o. male.  HPI Level V caveat. Patient with recent hospitalization and discharge after above-the-knee amputation for osteomyelitis of the right lower extremity. He was at dialysis today for his normally scheduled Monday, Wednesday, Friday dialysis.Marcell Anger unconscious around [phone] and EMS was called. EMS unsure of how much dialysis, if any, was completed prior to their arrival. Patient was apneic with no pulseless and asystolic on the monitor. CPR was initiated. Received 4 doses of epinephrine. Patient went into PEA arrest. He had one episode of ventricular fibrillation and was electrically cardioverted by EMS. Patient regained pulses at 0755. ET tube was placed by EMS en route. Had also been given a dose of Narcan. Past Medical History:  Diagnosis Date  . Anemia of chronic disease   . Arthritis   . CAD (coronary artery disease)    Moderate multivessel disease 07/2015 - managed medically  . ESRD on hemodialysis [facility])    M/W/F in [place] - Dr. Florene Glen  . Essential hypertension   . Gout   . History of blood transfusion   . History of myopericarditis 2015   07/2015  . History of pneumonia 2014  . History of stroke   . Hypercholesterolemia   . Nonischemic cardiomyopathy (HCC)    LVEF 35-40%  . Peripheral vascular disease ([place])    Status post right below knee amputation for a nonhealing wound of the right foot 12/2014  . Pneumonia     Patient Active Problem List   Diagnosis Date Noted  . Amputation stump infection ([place]) 12/23/2016  . Osteomyelitis ([place]) 12/23/2016  . AF (paroxysmal atrial fibrillation) ([place]) 08/19/2016  . Hypertriglyceridemia   . Hypokalemia   . Hypomagnesemia   . Gastrointestinal hemorrhage associated with anorectal source   . CAD in native artery     . Aortic stenosis   . Coronary artery disease involving native coronary artery of native heart with angina pectoris ([place])   . Hyperlipidemia   . Cardiomyopathy, ischemic   . GI bleed 03/03/2016  . Hyperglycemia 03/03/2016  . GI bleeding 03/03/2016  . Elevated troponin   . Hypertensive urgency 11/09/2015  . Hypertensive urgency, malignant 11/08/2015  . Malnutrition of moderate degree 11/08/2015  . Adjustment disorder with depressed mood   . Constipation due to pain medication   . Slow transit constipation   . Unilateral complete BKA ([place]) 10/23/2015  . Labile blood pressure   . Muscle stiffness   . S/P AKA (above knee amputation) unilateral ([place])   . History of right above knee amputation ([place])   . Benign essential HTN   . Unilateral AKA ([place]) 10/12/2015  . Abnormality of gait   . Status post above knee amputation of left lower extremity ([place])   . Chronic combined systolic and diastolic congestive heart failure ([place])   . ESRD on dialysis ([place])   . Essential hypertension   . Post-operative pain   . Peripheral vascular disease with pain at rest [facility]) 10/10/2015  . PD catheter dysfunction ([place]) 03/13/2015  . Kidney mass   . Other emphysema ([place])   . Physical deconditioning   . S/P BKA (below knee amputation) unilateral ([place])   . Renal mass 01/01/2015  . Chronic combined systolic and diastolic CHF (congestive heart failure) ([place]) 12/30/2014  .  CAD (coronary artery disease) 12/30/2014  . Hyperkalemia 12/30/2014  . Fluid overload 08/29/2014  . Acute on chronic combined systolic and diastolic CHF (congestive heart failure) ([place]) 08/29/2014  . Acute myopericarditis 08/18/2014  . Anemia of chronic disease   . End stage renal disease, has been peritoneal, now hemodialysis 07/28/2012  . Secondary cardiomyopathy ([place]) 06/26/2012  . HLD (hyperlipidemia) 12/10/2006  . Hypertensive heart disease 08/18/2006  . OSTEOARTHRITIS 08/18/2006  . Polycystic kidney 08/18/2006    Past  Surgical History:  Procedure Laterality Date  . ABOVE KNEE LEG AMPUTATION Left 10/10/2015  . AMPUTATION Right 01/05/2015   BKA  . AMPUTATION Left 10/10/2015   Procedure: AMPUTATION ABOVE KNEE LEFT;  Surgeon: Angelia Mould, MD;  Location: [place];  Service: Vascular;  Laterality: Left;  . AMPUTATION Right 12/31/2016   Procedure: AMPUTATION ABOVE KNEE;  Surgeon: Angelia Mould, MD;  Location: [place];  Service: Vascular;  Laterality: Right;  . AV FISTULA PLACEMENT  08/24/2012   Procedure: ARTERIOVENOUS (AV) FISTULA CREATION;  Surgeon: Rosetta Posner, MD;  Location: [place];  Service: Vascular;  Laterality: Left;  . BACK SURGERY    . CAPD REMOVAL N/A 03/13/2015   Procedure: CONTINUOUS AMBULATORY PERITONEAL DIALYSIS  (CAPD) CATHETER REMOVAL;  Surgeon: Coralie Keens, MD;  Location: [place];  Service: General;  Laterality: N/A;  . CARDIAC CATHETERIZATION    . CHOLECYSTECTOMY    . COLONOSCOPY     remote past by Dr. Tamala Julian in [place], [place]   . COLONOSCOPY Left 09/26/2014   Dr. Paulita Fujita: old and semi-fresh blood scattered throughout the colon, several medium-sized diverticula, no obvious focal bleeding site, no blood in TI, several polyps seen in cecum, ascending colon and proximal transverse s/p biopsy (tubular adenomas)  . COLONOSCOPY N/A 03/05/2016   Dr. Oneida Alar: old blood in ileum, few fresh clots ascending colon, frequent large mouth diverticula and old clots in descending/sigmoid. 8 polyps in ascending colon not manipulated  . ESOPHAGOGASTRODUODENOSCOPY N/A 03/05/2016   Dr. Oneida Alar: hiatal hernia, mild gastritis   . GANGLION CYST EXCISION  01/03/2012   Procedure: REMOVAL GANGLION OF WRIST;  Surgeon: Scherry Ran, MD;  Location: [facility];  Service: General;  Laterality: Right;  . GIVENS CAPSULE STUDY N/A 03/07/2016   [place]: dark material in ileum, with fresh blood in right colon   . HEMORRHOID SURGERY  1970's  . INSERTION OF DIALYSIS CATHETER Right     neck  . INSERTION OF DIALYSIS  CATHETER  10/19/2012   Procedure: INSERTION OF DIALYSIS CATHETER;  Surgeon: Rosetta Posner, MD;  Location: [facility];  Service: Vascular;  Laterality: N/A;  [place] ARTHROSCOPY Right 2007  . LEFT HEART CATHETERIZATION WITH CORONARY ANGIOGRAM N/A 08/17/2014   Procedure: LEFT HEART CATHETERIZATION WITH CORONARY ANGIOGRAM;  Surgeon: Larey Dresser, MD;  Location: [facility] C[facility] LAB;  Service: Cardiovascular;  Laterality: N/A;  . [place] SURGERY  2004       Home Medications    Prior to Admission medications   Medication Sig Start Date End Date Taking? Authorizing Provider  acetaminophen (TYLENOL) 500 MG tablet Take 2 tablets (1,000 mg total) by mouth 3 (three) times daily. X 4 days 01/03/17  Yes Ripudeep K Rai, MD  amLODipine (NORVASC) 5 MG tablet Take 1 tablet by mouth daily. 11/12/16  Yes Historical Provider, MD  atorvastatin (LIPITOR) 80 MG tablet Take 1 tablet (80 mg total) by mouth daily. Patient taking differently: Take 80 mg by mouth daily after supper.  11/02/15  Yes Arnoldo Lenis, MD  brimonidine (ALPHAGAN) 0.2 % ophthalmic solution Place 1 drop into both eyes 2 (two) times daily.   Yes Historical Provider, MD  cinacalcet (SENSIPAR) 60 MG tablet Take 60 mg by mouth daily after supper.    Yes Historical Provider, MD  diclofenac sodium (VOLTAREN) 1 % GEL Apply 1 application topically 3 (three) times daily as needed (pain).  12/12/16  Yes Historical Provider, MD  docusate sodium (COLACE) 100 MG capsule Take 2 capsules (200 mg total) by mouth daily. Patient taking differently: Take 200-300 mg by mouth daily as needed for mild constipation or moderate constipation.  10/31/15  Yes Ivan Anchors Love, PA-C  feeding supplement (BOOST / RESOURCE BREEZE) LIQD Take 1 Container by mouth 4 (four) times daily -  with meals and at bedtime. Patient taking differently: Take 1 Container by mouth daily.  03/14/16  Yes Lavina Hamman, MD  gabapentin (NEURONTIN) 300 MG capsule Take 1 capsule (300 mg  total) by mouth at bedtime. 01/03/17  Yes Ripudeep Krystal Eaton, MD  hydrALAZINE (APRESOLINE) 50 MG tablet Take 50 mg by mouth 2 (two) times daily as needed (high blood pressure >160/80).    Yes Historical Provider, MD  isosorbide mononitrate (IMDUR) 30 MG 24 hr tablet TAKE 1/2 TABLET BY MOUTH AT BEDTIME. 07/10/16  Yes Arnoldo Lenis, MD  ketoconazole (NIZORAL) 2 % cream Apply 1 application topically daily as needed for irritation (itching).  11/07/16  Yes Historical Provider, MD  lidocaine-prilocaine (EMLA) cream Apply 1 application topically every Monday, Wednesday, and Friday. For dialysis 10/06/15  Yes Historical Provider, MD  nitroGLYCERIN (NITROSTAT) 0.4 MG SL tablet Place 1 tablet (0.4 mg total) under the tongue every 5 (five) minutes as needed for chest pain. 12/20/15  Yes Ripudeep Krystal Eaton, MD  oxyCODONE-acetaminophen (PERCOCET/ROXICET) 5-325 MG tablet Take 1 tablet by mouth every 4 (four) hours as needed for severe pain. 01/04/17  Yes Ripudeep Krystal Eaton, MD  pantoprazole (PROTONIX) 40 MG tablet Take 1 tablet (40 mg total) by mouth daily at 12 noon. 10/23/15  Yes Barton Dubois, MD  polyethylene glycol [facility] / GLYCOLAX) packet Take 17 g by mouth daily as needed for mild constipation. 01/04/17  Yes Ripudeep Krystal Eaton, MD  sevelamer carbonate (RENVELA) 800 MG tablet Take 1,600-2,400 mg by mouth See admin instructions. Take 3 tablets (2400 mg) with meals and 2 tablets (1600 mg) with snacks   Yes Historical Provider, MD  timolol (BETIMOL) 0.5 % ophthalmic solution Place 1 drop into both eyes 2 (two) times daily.   Yes Historical Provider, MD    Family History Family History  Problem Relation Age of Onset  . Stroke Mother   . Arthritis    . Cancer    . Kidney disease    . Cancer Sister   . Colon cancer Brother     unclear   . Colon cancer Brother   . Anesthesia problems Neg Hx   . Hypotension Neg Hx   . Malignant hyperthermia Neg Hx   . Pseudochol deficiency Neg Hx     Social History Social History   Substance Use Topics  . Smoking status: Former Smoker    Packs/day: 1.00    Years: 30.00    Types: Cigarettes    Quit date: 08/19/1998  . Smokeless tobacco: Former Systems developer    Types: Chew    Quit date: 12/31/1993  . Alcohol use No     Allergies   Patient has no known allergies.  Review of Systems Review of Systems  Unable to perform ROS: Intubated     Physical Exam Updated Vital Signs BP (!) 83/60   Pulse 71   Temp 99.1 F (37.3 C) (Rectal)   Resp 17   Wt 140 lb (63.5 kg)   SpO2 100%   BMI 21.29 kg/m   Physical Exam  Constitutional: He appears well-developed and well-nourished.  HENT:  Head: Normocephalic and atraumatic.  ET tube in place  Eyes: EOM are normal. Pupils are equal, round, and reactive to light.  Pupils are 5 mm and nonreactive.  Neck: Normal range of motion. Neck supple. JVD present.  Cardiovascular: Regular rhythm.   Irregularly irregular  Pulmonary/Chest: Effort normal. He has wheezes.  Lung sounds in bilateral lung fields. Absent over the epigastrium. Mild expiratory wheezing.  Abdominal: Soft. Bowel sounds are normal. He exhibits distension. There is no rebound.  Mildly distended abdomen. Patient has a area of belt-like bruising in the left lower quadrant to the umbilicus.  Musculoskeletal: Normal range of motion. He exhibits no edema or tenderness.  Bilateral AKA's. Surgical wound is intact on the right lower extremity. There is no swelling, erythema or warmth. Left upper extremity dialysis fistula with thrill.  Neurological:  GCS of 3. Patient is making purposeful movement. Taking occasional breaths over the vent.  Skin: Skin is warm and dry. No rash noted. No erythema.  Psychiatric: He has a normal mood and affect. His behavior is normal.  Nursing note and vitals reviewed.    ED Treatments / Results  Labs (all labs ordered are listed, but only abnormal results are displayed) Labs Reviewed  CBC WITH DIFFERENTIAL/PLATELET - Abnormal;  Notable for the following:       Result Value   WBC 10.9 (*)    RBC 3.43 (*)    Hemoglobin 10.0 (*)    HCT 34.8 (*)    MCV 101.5 (*)    MCHC 28.7 (*)    RDW 20.7 (*)    Neutro Abs 9.4 (*)    All other components within normal limits  COMPREHENSIVE METABOLIC PANEL - Abnormal; Notable for the following:    Chloride 99 (*)    CO2 20 (*)    Glucose, Bld 149 (*)    BUN 42 (*)    Creatinine, Ser 6.16 (*)    Calcium 8.3 (*)    Total Protein 5.8 (*)    Albumin 2.0 (*)    AST 115 (*)    GFR calc non Af Amer 7 (*)    GFR calc Af Amer 9 (*)    Anion gap 19 (*)    All other components within normal limits  BRAIN NATRIURETIC PEPTIDE - Abnormal; Notable for the following:    B Natriuretic Peptide 2,027.0 (*)    All other components within normal limits  BLOOD GAS, ARTERIAL - Abnormal; Notable for the following:    pO2, Arterial 444 (*)    Acid-base deficit 4.4 (*)    All other components within normal limits  MAGNESIUM - Abnormal; Notable for the following:    Magnesium 2.5 (*)    All other components within normal limits  I-STAT CG4 LACTIC ACID, ED - Abnormal; Notable for the following:    Lactic Acid, Venous 11.35 (*)    All other components within normal limits  I-STAT CHEM 8, ED - Abnormal; Notable for the following:    BUN 41 (*)    Creatinine, Ser 6.00 (*)    Glucose, Bld 147 (*)  Calcium, Ion 1.04 (*)    Hemoglobin 10.9 (*)    HCT 32.0 (*)    All other components within normal limits  I-STAT TROPOININ, ED - Abnormal; Notable for the following:    Troponin i, poc 0.75 (*)    All other components within normal limits  I-STAT CG4 LACTIC ACID, ED - Abnormal; Notable for the following:    Lactic Acid, Venous 6.81 (*)    All other components within normal limits  CULTURE, BLOOD (ROUTINE X 2)  CULTURE, BLOOD (ROUTINE X 2)  TROPONIN I    EKG  EKG Interpretation  Date/Time:  Monday January 06 2017 08:18:15 EDT Ventricular Rate:  98 PR Interval:    QRS Duration: 170 QT  Interval:  391 QTC Calculation: 463 R Axis:   70 Text Interpretation:  Atrial fibrillation Ventricular tachycardia, unsustained Right bundle branch block Repol abnrm suggests ischemia, diffuse leads Confirmed by Lita Mains  MD, Niketa Turner ([id number]) on 12/30/2016 12:23:26 PM       Radiology Ct Head Wo Contrast  Result Date: 01/12/2017 CLINICAL DATA:  Altered mental status. EXAM: CT HEAD WITHOUT CONTRAST TECHNIQUE: Contiguous axial images were obtained from the base of the skull through the vertex without intravenous contrast. COMPARISON:  CT scan of February 21, 2006. FINDINGS: Brain: Mild diffuse cortical atrophy is noted. Minimal chronic ischemic white matter disease is noted. No mass effect or midline shift is noted. Ventricular size is within normal limits. There is no evidence of mass lesion, hemorrhage or acute infarction. Vascular: No hyperdense vessel or unexpected calcification. Skull: Normal. Negative for fracture or focal lesion. Sinuses/Orbits: No acute finding. Other: None. IMPRESSION: Mild diffuse cortical atrophy. Minimal chronic ischemic white matter disease. No acute intracranial abnormality seen. Electronically Signed   By: Marijo Conception, M.D.   On: 12/22/2016 11:36   Dg Chest Port 1 View  Result Date: 01/02/2017 CLINICAL DATA:  Cardiac arrest during dialysis. Post intubation and nasogastric tube placement. EXAM: PORTABLE CHEST 1 VIEW COMPARISON:  12/17/2015. FINDINGS: Endotracheal tube terminates approximately 3.8 cm above the carina. Nasogastric tube is followed into the distal esophagus with the tip projecting beyond the inferior margin of the image. Defibrillator pad projects over the left hemithorax. Heart is enlarged. Thoracic aorta is calcified. Left lower lobe opacification, elevated left hemidiaphragm and blunting of left costophrenic angle are stable. Right lung is grossly clear. IMPRESSION: Pleural-parenchymal scarring and volume loss at the base of the left hemithorax. No definite  acute findings. Electronically Signed   By: Lorin Picket M.D.   On: 12/29/2016 08:40    Procedures Procedures (including critical care time)  Medications Ordered in ED Medications  amiodarone (CORDARONE) 150 MG/3ML injection (  Hold 12/26/2016 0826)  amiodarone (NEXTERONE PREMIX) 360-4.14 MG/200ML-% (1.8 mg/mL) IV infusion (0 mg/hr Intravenous Paused 01/04/2017 1145)    Followed by  amiodarone (NEXTERONE PREMIX) 360-4.14 MG/200ML-% (1.8 mg/mL) IV infusion (not administered)  norepinephrine (LEVOPHED) 4 mg in dextrose 5 % 250 mL (0.016 mg/mL) infusion (not administered)  calcium gluconate 10 % injection (1,000 mg  Given 01/12/2017 0810)  amiodarone (NEXTERONE) 150-4.21 MG/100ML-% bolus (  Stopped 01/10/2017 0841)  sodium bicarbonate injection 100 mEq (100 mEq Intravenous Given 12/28/2016 0835)  piperacillin-tazobactam (ZOSYN) IVPB 3.375 g (0 g Intravenous Stopped 01/06/17 1003)  vancomycin (VANCOCIN) IVPB 1000 mg/200 mL premix (0 mg Intravenous Stopped 01/06/17 1033)  sodium chloride 0.9 % bolus 500 mL (500 mLs Intravenous New Bag/Given 01/06/17 1144)   CRITICAL CARE Performed by: Lita Mains, Lido Maske Total critical care  time: 50 minutes Critical care time was exclusive of separately billable procedures and treating other patients. Critical care was necessary to treat or prevent imminent or life-threatening deterioration. Critical care was time spent personally by me on the following activities: development of treatment plan with patient and/or surrogate as well as nursing, discussions with consultants, evaluation of patient's response to treatment, examination of patient, obtaining history from patient or surrogate, ordering and performing treatments and interventions, ordering and review of laboratory studies, ordering and review of radiographic studies, pulse oximetry and re-evaluation of patient's condition.  Initial Impression / Assessment and Plan / ED Course  I have reviewed the triage vital signs and  the nursing notes.  Pertinent labs & imaging results that were available during my care of the patient were reviewed by me and considered in my medical decision making (see chart for details).     She given dose of calcium gluconate was concern for hyperkalemia. Also having multiple PVCs and short runs of Vtach  Discussed with critical care on call. Suggested CT head to look for anoxic brain injury. Wife is at bedside. She is informed of the patient's critical condition.  CT head without acute findings. Patient had a gradual decrease in blood pressure. Given repeat bolus of IV fluids. Also start on low dose Levophed drip. Discussed with Dr. Halford Chessman. Will accept in transport to ICU bed.  Repeat lactic acid is significantly improved though still remains elevated. Final Clinical Impressions(s) / ED Diagnoses   Final diagnoses:  Cardiopulmonary arrest ([place])  Acute respiratory failure with hypercapnia (HCC)  Ventricular tachycardia, non-sustained (HCC)  Hypotension, unspecified hypotension type    New Prescriptions New Prescriptions   No medications on file     Julianne Rice, MD 01/05/2017 1224

## 2017-01-06 NOTE — Progress Notes (Signed)
  Amiodarone Drug - Drug Interaction Consult Note  Recommendations:  Amiodarone is metabolized by the cytochrome P450 system and therefore has the potential to cause many drug interactions. Amiodarone has an average plasma half-life of 50 days (range 20 to 100 days).   There is potential for drug interactions to occur several weeks or months after stopping treatment and the onset of drug interactions may be slow after initiating amiodarone.   [x]  Statins: Increased risk of myopathy. Simvastatin- restrict dose to 20mg  daily. Other statins: counsel patients to report any muscle pain or weakness immediately.  []  Anticoagulants: Amiodarone can increase anticoagulant effect. Consider warfarin dose reduction. Patients should be monitored closely and the dose of anticoagulant altered accordingly, remembering that amiodarone levels take several weeks to stabilize.  []  Antiepileptics: Amiodarone can increase plasma concentration of phenytoin, the dose should be reduced. Note that small changes in phenytoin dose can result in large changes in levels. Monitor patient and counsel on signs of toxicity.  []  Beta blockers: increased risk of bradycardia, AV block and myocardial depression. Sotalol - avoid concomitant use.  []   Calcium channel blockers (diltiazem and verapamil): increased risk of bradycardia, AV block and myocardial depression.  []   Cyclosporine: Amiodarone increases levels of cyclosporine. Reduced dose of cyclosporine is recommended.  []  Digoxin dose should be halved when amiodarone is started.  []  Diuretics: increased risk of cardiotoxicity if hypokalemia occurs.  []  Oral hypoglycemic agents (glyburide, glipizide, glimepiride): increased risk of hypoglycemia. Patient's glucose levels should be monitored closely when initiating amiodarone therapy.   []  Drugs that prolong the QT interval:  Torsades de pointes risk may be increased with concurrent use - avoid if possible.  Monitor QTc, also  keep magnesium/potassium WNL if concurrent therapy can't be avoided. Marland Kitchen Antibiotics: e.g. fluoroquinolones, erythromycin. . Antiarrhythmics: e.g. quinidine, procainamide, disopyramide, sotalol. . Antipsychotics: e.g. phenothiazines, haloperidol.  . Lithium, tricyclic antidepressants, and methadone. Thank You,  Excell Seltzer Poteet  01/20/2017 11:21 AM

## 2017-01-06 NOTE — ED Notes (Signed)
Pt stable and ready for transport to [id number].  Report given to Allegra Grana, RN.

## 2017-01-07 ENCOUNTER — Inpatient Hospital Stay (HOSPITAL_COMMUNITY): Payer: Medicare Other

## 2017-01-07 DIAGNOSIS — I472 Ventricular tachycardia: Secondary | ICD-10-CM

## 2017-01-07 DIAGNOSIS — I4729 Other ventricular tachycardia: Secondary | ICD-10-CM

## 2017-01-07 DIAGNOSIS — J9602 Acute respiratory failure with hypercapnia: Secondary | ICD-10-CM

## 2017-01-07 DIAGNOSIS — I959 Hypotension, unspecified: Secondary | ICD-10-CM

## 2017-01-07 DIAGNOSIS — L8990 Pressure ulcer of unspecified site, unspecified stage: Secondary | ICD-10-CM | POA: Insufficient documentation

## 2017-01-07 LAB — BASIC METABOLIC PANEL
Anion gap: 23 — ABNORMAL HIGH (ref 5–15)
BUN: 63 mg/dL — AB (ref 6–20)
CO2: 22 mmol/L (ref 22–32)
Calcium: 7.7 mg/dL — ABNORMAL LOW (ref 8.9–10.3)
Chloride: 96 mmol/L — ABNORMAL LOW (ref 101–111)
Creatinine, Ser: 7.42 mg/dL — ABNORMAL HIGH (ref 0.61–1.24)
GFR calc Af Amer: 7 mL/min — ABNORMAL LOW (ref >60)
GFR calc non Af Amer: 6 mL/min — ABNORMAL LOW (ref >60)
Glucose, Bld: 100 mg/dL — ABNORMAL HIGH (ref 65–99)
Potassium: 5.7 mmol/L — ABNORMAL HIGH (ref 3.5–5.1)
SODIUM: 141 mmol/L (ref 135–145)

## 2017-01-07 LAB — CBC
HCT: 35.7 % — ABNORMAL LOW (ref 39.0–52.0)
Hemoglobin: 10.9 g/dL — ABNORMAL LOW (ref 13.0–17.0)
MCH: 28.3 pg (ref 26.0–34.0)
MCHC: 30.5 g/dL (ref 30.0–36.0)
MCV: 92.7 fL (ref 78.0–100.0)
PLATELETS: 281 10*3/uL (ref 150–400)
RBC: 3.85 MIL/uL — AB (ref 4.22–5.81)
RDW: 20.6 % — ABNORMAL HIGH (ref 11.5–15.5)
WBC: 11.6 10*3/uL — ABNORMAL HIGH (ref 4.0–10.5)

## 2017-01-07 LAB — GLUCOSE, CAPILLARY
Glucose-Capillary: 103 mg/dL — ABNORMAL HIGH (ref 65–99)
Glucose-Capillary: 98 mg/dL (ref 65–99)

## 2017-01-07 LAB — MAGNESIUM: MAGNESIUM: 2.6 mg/dL — AB (ref 1.7–2.4)

## 2017-01-07 LAB — PHOSPHORUS: PHOSPHORUS: 8.2 mg/dL — AB (ref 2.5–4.6)

## 2017-01-07 MED ORDER — SODIUM CHLORIDE 0.9 % IV SOLN
10.0000 mg/h | INTRAVENOUS | Status: DC
  Administered 2017-01-07: 2 mg/h via INTRAVENOUS
  Filled 2017-01-07: qty 10

## 2017-01-07 MED ORDER — ACETAMINOPHEN 325 MG PO TABS
650.0000 mg | ORAL_TABLET | Freq: Four times a day (QID) | ORAL | Status: DC | PRN
  Administered 2017-01-07: 650 mg
  Filled 2017-01-07: qty 2

## 2017-01-07 MED ORDER — MORPHINE BOLUS VIA INFUSION
5.0000 mg | INTRAVENOUS | Status: DC | PRN
  Administered 2017-01-07: 2 mg via INTRAVENOUS
  Administered 2017-01-07: 5 mg via INTRAVENOUS
  Filled 2017-01-07: qty 20

## 2017-01-08 ENCOUNTER — Ambulatory Visit: Payer: Medicare Other | Admitting: Vascular Surgery

## 2017-01-11 LAB — CULTURE, BLOOD (ROUTINE X 2)
CULTURE: NO GROWTH
Culture: NO GROWTH

## 2017-01-11 IMAGING — CR DG CHEST 1V PORT
1 series · 1 of 1 positions shown · non-contrast
Comparison: 01/06/2015

CLINICAL DATA: Dyspnea.  Pneumonia.

EXAM:
PORTABLE CHEST - 1 VIEW

[AP]
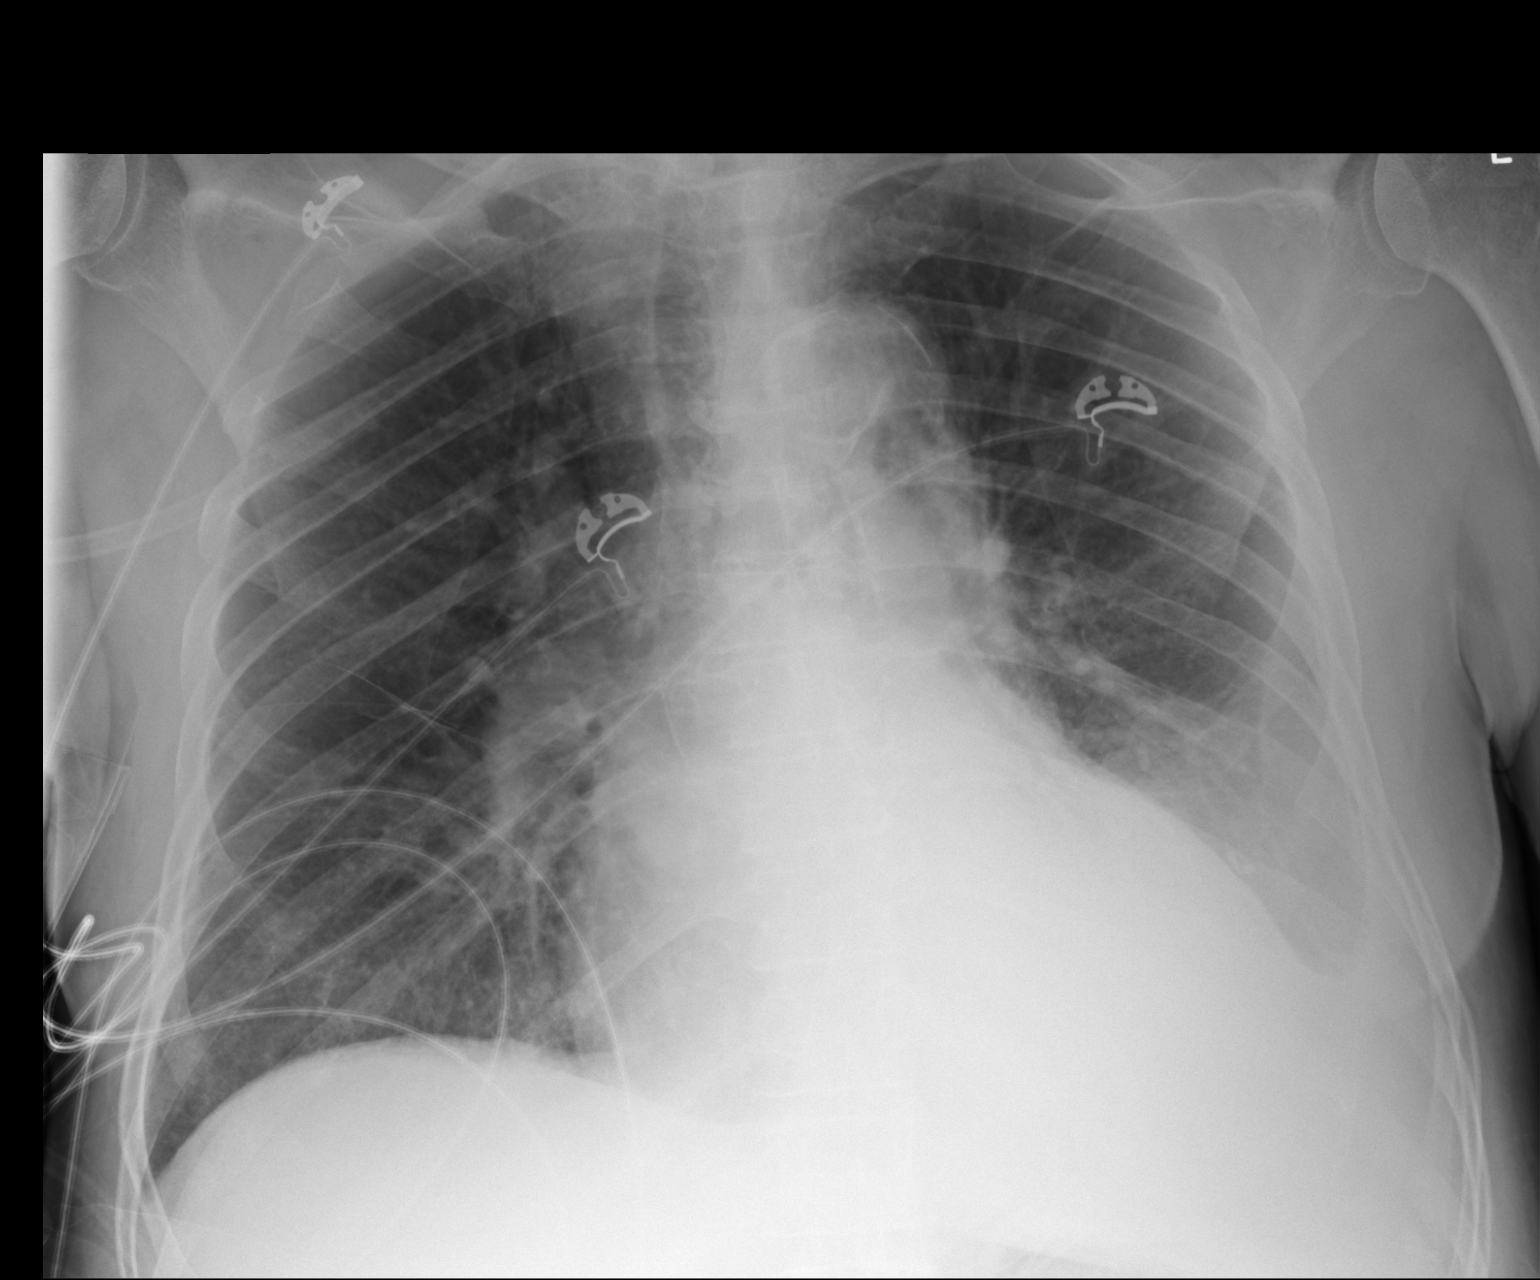

[1 of 1 positions shown; findings below may reference images not displayed]

FINDINGS: The cardiac silhouette, mediastinal and hilar contours are stable.
There is a persistent left lower lobe process likely a combination
of infiltrate or atelectasis and effusion. Overall, the lungs are
better aerated with resolution of interstitial pulmonary edema.
IMPRESSION: Improved aeration with resolution of interstitial edema.

Persistent left lower lobe process.

## 2017-01-21 NOTE — Progress Notes (Signed)
   02/06/2017 1120  Clinical Encounter Type  Visited With Patient and family together  Visit Type Other (Comment) ([place] consult)  Spiritual Encounters  Spiritual Needs Prayer;Emotional  Stress Factors  Patient Stress Factors None identified  Family Stress Factors Family relationships  Introduction to family as Pt is medicated. Offered prayer of comfort and peace.

## 2017-01-21 NOTE — Progress Notes (Signed)
Wasted 240 ml of Morphine, witnessed by Damita Dunnings, RN.

## 2017-01-21 NOTE — Procedures (Signed)
Extubation Procedure Note  Patient Details:   Name: Randy [name] DOB: [birth year] MRN: [id number]   Airway Documentation:     Evaluation  O2 sats: stable throughout Complications: No apparent complications Patient did tolerate procedure well. Bilateral Breath Sounds: Clear, Diminished   No   Patient extubated to RA per end of life withdrawal order. Cuff leak was heard. No stridor noted. RN and family at bedside with RT during extubation.  Tiburcio Bash Jan 25, 2017, 3:36 PM

## 2017-01-21 NOTE — Progress Notes (Addendum)
PULMONARY / CRITICAL CARE MEDICINE   Name: Randy [name] MRN: [id number] DOB: 09-May-1932    ADMISSION DATE:  12/25/2016 CONSULTATION DATE:  12/29/2016  REFERRING MD:  Dr. Lita Mains (EDP)  CHIEF COMPLAINT:  Cardiac Arrest  HISTORY OF PRESENT ILLNESS:  53 yoM who presented to [facility] with cardiac arrest on 4/16.    Patient has a medical history of ESRD on HD MWF, CAD, HTN, NICM, chronic anemia, PVD s/p L AKA and recent right AKA (on 4/10), former smoker and Afib not on [place] due to prior GIB.  He was recently discharged on 4/14 s/p right AKA secondary to cellulitis and was treated empirically for osteomyelitis.  It was recommended patient go to inpatient rehab or SNF on discharge, however the family declined and the patient went home with home health PT.    He was at dialysis and arrested prolonged  SUBJECTIVE: mild fever, not responding, pressors, levo high dose   VITAL SIGNS: BP 104/67   Pulse 93   Temp 100.3 F (37.9 C) (Oral) Comment: [name], RN notified  Resp 19   Wt 61.8 kg (136 lb 3.9 oz)   SpO2 95%   BMI 20.72 kg/m   HEMODYNAMICS:    VENTILATOR SETTINGS: Vent Mode: PRVC FiO2 (%):  [40 %-100 %] 40 % Set Rate:  [14 bmp] 14 bmp Vt Set:  [500 mL] 500 mL PEEP:  [5 cmH20] 5 cmH20 Plateau Pressure:  [17 cmH20-21 cmH20] 19 cmH20  INTAKE / OUTPUT: I/O last 3 completed shifts: In: 1595.1 [I.V.:645.1; NG/GT:150; IV Piggyback:800] Out: 300 [Emesis/NG output:300]  PHYSICAL EXAMINATION: General:  Frail elderly male in NAD on vent HEENT: per 2 mm NONREACTIVE< NO corneal's, NO DOLLS eyes Neuro:not responding to pain on any ext, no cough, no gag, no dolls eyes, does breath over vent and spont slight CV: s1s2 rrr, no m PULM: lungs clear bilaterally LK:GMWN, non-distended, bsx4 active  Extremities: warm/dry, edema, LUE AVF , right and left AKA Skin: no rashes or lesions   LABS:  BMET  Recent Labs Lab 01/02/17 0330 01/05/2017 0813 01/05/2017 0841 Jan 09, 2017 0239  NA 138  136 138 141  K 4.2 4.5 4.6 5.7*  CL 98* 104 99* 96*  CO2 28  --  20* 22  BUN 26* 41* 42* 63*  CREATININE 5.51* 6.00* 6.16* 7.42*  GLUCOSE 92 147* 149* 100*    Electrolytes  Recent Labs Lab 01/02/17 0330 01/09/2017 0841 09-Jan-2017 0239  CALCIUM 8.9 8.3* 7.7*  MG  --  2.5* 2.6*  PHOS  --   --  8.2*    CBC  Recent Labs Lab 01/02/17 0330 12/25/2016 0813 01/09/2017 0841 01-09-17 0239  WBC 6.6  --  10.9* 11.6*  HGB 10.4* 10.9* 10.0* 10.9*  HCT 36.0* 32.0* 34.8* 35.7*  PLT 221  --  199 281    Coag's No results for input(s): APTT, INR in the last 168 hours.  Sepsis Markers  Recent Labs Lab 12/31/2016 0828 01/15/2017 1157  LATICACIDVEN 11.35* 6.81*    ABG  Recent Labs Lab 12/31/2016 0935  PHART 7.380  PCO2ART 34.3  PO2ART 444*    Liver Enzymes  Recent Labs Lab 12/27/2016 0841  AST 115*  ALT 43  ALKPHOS 100  BILITOT 0.6  ALBUMIN 2.0*    Cardiac Enzymes  Recent Labs Lab 01/05/2017 1145  TROPONINI 1.08*    Glucose  Recent Labs Lab 01/03/17 2128 01/04/17 0644 01/06/17 1606 01/06/17 2008 2017-01-09 0011 01/09/17 0351  GLUCAP 145* 103* 159* 126* 103* 98  Imaging Ct Head Wo Contrast  Result Date: 01/05/2017 CLINICAL DATA:  Altered mental status. EXAM: CT HEAD WITHOUT CONTRAST TECHNIQUE: Contiguous axial images were obtained from the base of the skull through the vertex without intravenous contrast. COMPARISON:  CT scan of February 21, 2006. FINDINGS: Brain: Mild diffuse cortical atrophy is noted. Minimal chronic ischemic white matter disease is noted. No mass effect or midline shift is noted. Ventricular size is within normal limits. There is no evidence of mass lesion, hemorrhage or acute infarction. Vascular: No hyperdense vessel or unexpected calcification. Skull: Normal. Negative for fracture or focal lesion. Sinuses/Orbits: No acute finding. Other: None. IMPRESSION: Mild diffuse cortical atrophy. Minimal chronic ischemic white matter disease. No acute  intracranial abnormality seen. Electronically Signed   By: Marijo Conception, M.D.   On: 12/22/2016 11:36   Dg Chest Port 1 View  Result Date: 12/25/2016 CLINICAL DATA:  Endotracheal tube assessment. EXAM: PORTABLE CHEST 1 VIEW COMPARISON:  01/10/2017. FINDINGS: The endotracheal tube is 2.5 cm above the carina. The NG tube is coursing down the esophagus and into the stomach. External pacer paddles are again demonstrated. The heart is mildly enlarged but stable. Stable tortuosity, ectasia and calcification of the thoracic aorta. The lungs are grossly clear. Suspect persistent small left effusion and left lower lobe atelectasis. IMPRESSION: Endotracheal tube is 2.5 cm above the carina. Stable cardiac enlargement and left basilar atelectasis and possible effusion. Electronically Signed   By: Marijo Sanes M.D.   On: 01/01/2017 17:32   Dg Chest Port 1 View  Result Date: 01/08/2017 CLINICAL DATA:  Cardiac arrest during dialysis. Post intubation and nasogastric tube placement. EXAM: PORTABLE CHEST 1 VIEW COMPARISON:  12/17/2015. FINDINGS: Endotracheal tube terminates approximately 3.8 cm above the carina. Nasogastric tube is followed into the distal esophagus with the tip projecting beyond the inferior margin of the image. Defibrillator pad projects over the left hemithorax. Heart is enlarged. Thoracic aorta is calcified. Left lower lobe opacification, elevated left hemidiaphragm and blunting of left costophrenic angle are stable. Right lung is grossly clear. IMPRESSION: Pleural-parenchymal scarring and volume loss at the base of the left hemithorax. No definite acute findings. Electronically Signed   By: Lorin Picket M.D.   On: 12/26/2016 08:40   Dg Abd Portable 1v  Result Date: 01/18/2017 CLINICAL DATA:  83 y/o  M; enteric tube placement. EXAM: PORTABLE ABDOMEN - 1 VIEW COMPARISON:  01/08/2017 chest radiograph FINDINGS: Mildly prominent loops of colon with large volume of stool may represent constipation.  Extensive vascular calcification. Mild lumbar dextrocurvature and advanced lumbar spine degenerative changes. Mild osteoarthrosis of bilateral hip joints. Transcutaneous pacing pads noted in left upper abdomen. Enteric tube tip projects over the stomach. IMPRESSION: 1. Enteric tube tip projects over the mid stomach. 2. Mildly prominent loops of colon with large volume of stool may represent constipation. Electronically Signed   By: Kristine Garbe M.D.   On: 01/06/2017 17:39     STUDIES:  Head CT 4/16 >> no acute findings  CULTURES: [facility] 4/16 >>  ANTIBIOTICS: Vancomycin 4/16 >> Zosyn 4/16 >>   SIGNIFICANT EVENTS: 4/16  Admit Cardiac Arrest  4/17- poor neurostatus, levo 30  LINES/TUBES: ETT 4/16 >>  DISCUSSION: 82 yoM admitted on 4/16 after 30 min PEA/ v-fib arrest that occurred at dialysis.  Epi x 4, defib x 1.  Remained unresponsive post ROSC.    ASSESSMENT / PLAN:  PULMONARY A: Acute respiratory failure - in the setting of cardiac arrest  P:   abg reviewed,  lactic down Keep same MV, if aggressive care will repeat abg to ensure need reduce MV  CARDIOVASCULAR A:  PEA/ V-fib cardiac arrest - etiology unclear  Ventricular ectopy  Shock - unclear etiology  Hx PAF (not on [place]), NICM, HTN, PVD P:  ICU monitoring Levophed for map >59mmHg, re increase will not improve outcome  And if off, restart will not change otucome amiodarone gtt - off Assess ECHO Hold home amlodipine, atorvastatin, hydralazine, imdur,   RENAL A:   Lactate Acidosis  ESRD on HD MWF P:   Lactic down k noted, if aggressive care will call renal for cvvhd likely not a candidate further  GASTROINTESTINAL A:   No acute issues Hx of GIB P:   PPI for SUP Hold feeds for now  HEMATOLOGIC A:   Hx Anemia of Chronic Disease P:  Heparin SQ for DVT prophylaxis   INFECTIOUS A:   No acute process Hx of prior RLE cellulitis/ ?osteo P:   Fever = neuro likley No infection noted Dc all  abx  ENDOCRINE A:   No acute issues P:   Cortisol if aggressive care continued  NEUROLOGIC A:   Acute encephalopathy - in the setting of post cardiac arrest, negative head CT, no response to narcan Severe Anoxic injury P:   rass goal goal 0 Exam all c/w brain death except breathing drive for now likely to advance to nrain death Will d/w family comfort immediately   FAMILY  - Updates: Family updated for DF , fam on way  - Inter-disciplinary family meet or Palliative Care meeting due by:  January 13, 2017.  Ccm time 35 min Lavon Paganini. Titus Mould, [place] Pgr: [facility]     Additional talk with wife, daughter Extensive discussion with family. We discussed the poor prognosis and likely poor quality of life. Family has decided to offer full comfort care. They are aware that the patient may be transferred to palliative care floor for continued comfort care needs. They have been fully updated on the process and expectations. We will not move to any further testing and he is NOT brain dead as has drive. Add morphine low dose and comfort meds

## 2017-01-21 NOTE — Progress Notes (Addendum)
Pt placed on comfort care orders, family and their Pastor  at bedside. Pt extubated and placed on RA. Pt went bradycardic soon after and then went asystole. Time of death 01-02-41, verified by two nurses Damita Dunnings and Dutch Quint). No pulses palpated. No breath sounds auscultated.  Pupils fixed and dilated.Post-Mortem checklist completed and CDS notified. Family assisted in preparing the body. Dentures at bedside were sent home with granddaughter. MD notified.

## 2017-01-21 DEATH — deceased

## 2017-01-22 ENCOUNTER — Telehealth: Payer: Self-pay

## 2017-01-22 NOTE — Telephone Encounter (Signed)
On 02-15-2017 I received a death certificate from [facility] (original). The death certificate is for burial. The patient is a patient of Doctor Titus Mould. The death certificate will be taken to [facility] this am for signature.  On 02-15-17 I received the death certificate back from Doctor Titus Mould. I got the death certificate ready and called the funeral home to let them know that the d/c was mailed to vital records per the funeral home request.

## 2017-02-05 ENCOUNTER — Encounter: Payer: Self-pay | Admitting: Vascular Surgery

## 2017-02-21 NOTE — Discharge Summary (Signed)
75 yoM who presented to [facility] with cardiac arrest on 4/16.   Patient has a medical history of ESRD on HD MWF, CAD, HTN, NICM, chronic anemia, PVD s/p L AKA and recent right AKA (on 4/10), former smoker and Afib not on [place] due to prior GIB.  He was recently discharged on 4/14 s/p right AKA secondary to cellulitis and was treated empirically for osteomyelitis.  It was recommended patient go to inpatient rehab or SNF on discharge, however the family declined and the patient went home with home health PT.   He was at dialysis and arrested prolonged hospital course--> mild fever, not responding, pressors, levo high dose Sever anoxic brain injury noted Com,fort care decdied  1. Anoxic brain injuryp cardiac arrest 2. ESRD  Randy [name]. Randy Mould, MD, [place] Pgr: [facility]
